# Patient Record
Sex: Female | Born: 1960 | Race: Black or African American | Hispanic: No | Marital: Married | Smoking: Former smoker
Health system: Southern US, Community
[De-identification: ages and names within clinical notes are randomized; demographics above are authoritative.]

## PROBLEM LIST (undated history)

## (undated) DIAGNOSIS — J4520 Mild intermittent asthma, uncomplicated: Secondary | ICD-10-CM

## (undated) DIAGNOSIS — R1319 Other dysphagia: Secondary | ICD-10-CM

## (undated) DIAGNOSIS — G4730 Sleep apnea, unspecified: Secondary | ICD-10-CM

## (undated) DIAGNOSIS — M1712 Unilateral primary osteoarthritis, left knee: Secondary | ICD-10-CM

## (undated) DIAGNOSIS — M1711 Unilateral primary osteoarthritis, right knee: Secondary | ICD-10-CM

## (undated) DIAGNOSIS — K5790 Diverticulosis of intestine, part unspecified, without perforation or abscess without bleeding: Secondary | ICD-10-CM

## (undated) DIAGNOSIS — A0472 Enterocolitis due to Clostridium difficile, not specified as recurrent: Secondary | ICD-10-CM

## (undated) DIAGNOSIS — Z930 Tracheostomy status: Secondary | ICD-10-CM

## (undated) DIAGNOSIS — L509 Urticaria, unspecified: Secondary | ICD-10-CM

## (undated) DIAGNOSIS — E43 Unspecified severe protein-calorie malnutrition: Secondary | ICD-10-CM

## (undated) DIAGNOSIS — J9601 Acute respiratory failure with hypoxia: Secondary | ICD-10-CM

## (undated) DIAGNOSIS — T17908A Unspecified foreign body in respiratory tract, part unspecified causing other injury, initial encounter: Secondary | ICD-10-CM

## (undated) DIAGNOSIS — D126 Benign neoplasm of colon, unspecified: Secondary | ICD-10-CM

## (undated) DIAGNOSIS — R0602 Shortness of breath: Secondary | ICD-10-CM

## (undated) DIAGNOSIS — N390 Urinary tract infection, site not specified: Secondary | ICD-10-CM

## (undated) DIAGNOSIS — D499 Neoplasm of unspecified behavior of unspecified site: Secondary | ICD-10-CM

## (undated) DIAGNOSIS — J302 Other seasonal allergic rhinitis: Secondary | ICD-10-CM

## (undated) DIAGNOSIS — G7000 Myasthenia gravis without (acute) exacerbation: Secondary | ICD-10-CM

## (undated) DIAGNOSIS — I10 Essential (primary) hypertension: Secondary | ICD-10-CM

## (undated) DIAGNOSIS — J189 Pneumonia, unspecified organism: Secondary | ICD-10-CM

## (undated) DIAGNOSIS — J9600 Acute respiratory failure, unspecified whether with hypoxia or hypercapnia: Secondary | ICD-10-CM

## (undated) DIAGNOSIS — D259 Leiomyoma of uterus, unspecified: Secondary | ICD-10-CM

## (undated) DIAGNOSIS — R1310 Dysphagia, unspecified: Secondary | ICD-10-CM

## (undated) DIAGNOSIS — S8990XA Unspecified injury of unspecified lower leg, initial encounter: Secondary | ICD-10-CM

## (undated) DIAGNOSIS — B9620 Unspecified Escherichia coli [E. coli] as the cause of diseases classified elsewhere: Secondary | ICD-10-CM

## (undated) DIAGNOSIS — D128 Benign neoplasm of rectum: Secondary | ICD-10-CM

## (undated) DIAGNOSIS — F418 Other specified anxiety disorders: Secondary | ICD-10-CM

## (undated) DIAGNOSIS — G43909 Migraine, unspecified, not intractable, without status migrainosus: Secondary | ICD-10-CM

## (undated) DIAGNOSIS — Z978 Presence of other specified devices: Secondary | ICD-10-CM

## (undated) DIAGNOSIS — J95851 Ventilator associated pneumonia: Secondary | ICD-10-CM

## (undated) DIAGNOSIS — K219 Gastro-esophageal reflux disease without esophagitis: Secondary | ICD-10-CM

## (undated) DIAGNOSIS — K9422 Gastrostomy infection: Principal | ICD-10-CM

## (undated) DIAGNOSIS — R740 Nonspecific elevation of levels of transaminase and lactic acid dehydrogenase [LDH]: Secondary | ICD-10-CM

## (undated) DIAGNOSIS — K429 Umbilical hernia without obstruction or gangrene: Secondary | ICD-10-CM

## (undated) DIAGNOSIS — G7001 Myasthenia gravis with (acute) exacerbation: Secondary | ICD-10-CM

## (undated) DIAGNOSIS — M25552 Pain in left hip: Secondary | ICD-10-CM

## (undated) DIAGNOSIS — K859 Acute pancreatitis without necrosis or infection, unspecified: Secondary | ICD-10-CM

## (undated) DIAGNOSIS — Z01419 Encounter for gynecological examination (general) (routine) without abnormal findings: Secondary | ICD-10-CM

## (undated) HISTORY — DX: Diverticulosis of intestine, part unspecified, without perforation or abscess without bleeding: K57.90

## (undated) HISTORY — DX: Benign neoplasm of rectum: D12.8

## (undated) HISTORY — DX: Sleep apnea, unspecified: G47.30

## (undated) HISTORY — DX: Acute respiratory failure with hypoxia: J96.01

## (undated) HISTORY — DX: Enterocolitis due to Clostridium difficile, not specified as recurrent: A04.72

## (undated) HISTORY — DX: Shortness of breath: R06.02

## (undated) HISTORY — DX: Unspecified foreign body in respiratory tract, part unspecified causing other injury, initial encounter: T17.908A

## (undated) HISTORY — DX: Pneumonia, unspecified organism: J18.9

## (undated) HISTORY — DX: Urinary tract infection, site not specified: N39.0

## (undated) HISTORY — DX: Urticaria, unspecified: L50.9

## (undated) HISTORY — DX: Acute respiratory failure, unspecified whether with hypoxia or hypercapnia: J96.00

## (undated) HISTORY — DX: Tracheostomy status: Z93.0

## (undated) HISTORY — DX: Benign neoplasm of colon, unspecified: D12.6

## (undated) HISTORY — PX: VAGINAL HYSTERECTOMY: SUR661

## (undated) HISTORY — DX: Myasthenia gravis with (acute) exacerbation: G70.01

## (undated) HISTORY — DX: Acute pancreatitis without necrosis or infection, unspecified: K85.90

## (undated) HISTORY — DX: Ventilator associated pneumonia: J95.851

## (undated) HISTORY — DX: Unspecified severe protein-calorie malnutrition: E43

## (undated) HISTORY — DX: Nonspecific elevation of levels of transaminase and lactic acid dehydrogenase (ldh): R74.0

## (undated) HISTORY — DX: Other dysphagia: R13.19

## (undated) HISTORY — DX: Gastrostomy infection: K94.22

## (undated) HISTORY — DX: Presence of other specified devices: Z97.8

## (undated) HISTORY — DX: Unspecified Escherichia coli (E. coli) as the cause of diseases classified elsewhere: B96.20

## (undated) HISTORY — DX: Encounter for gynecological examination (general) (routine) without abnormal findings: Z01.419

## (undated) HISTORY — DX: Mild intermittent asthma, uncomplicated: J45.20

## (undated) HISTORY — PX: DILATION AND CURETTAGE OF UTERUS: SHX78

## (undated) HISTORY — DX: Pain in left hip: M25.552

---

## 1987-03-04 HISTORY — PX: TUBAL LIGATION: SHX77

## 1997-09-19 ENCOUNTER — Emergency Department (HOSPITAL_COMMUNITY): Admission: EM | Admit: 1997-09-19 | Discharge: 1997-09-20 | Payer: Self-pay | Admitting: Emergency Medicine

## 1997-09-20 ENCOUNTER — Encounter: Admission: RE | Admit: 1997-09-20 | Discharge: 1997-09-20 | Payer: Self-pay | Admitting: *Deleted

## 1997-09-20 ENCOUNTER — Encounter: Admission: RE | Admit: 1997-09-20 | Discharge: 1997-12-19 | Payer: Self-pay | Admitting: Internal Medicine

## 1997-09-27 ENCOUNTER — Encounter: Admission: RE | Admit: 1997-09-27 | Discharge: 1997-09-27 | Payer: Self-pay | Admitting: Internal Medicine

## 1998-01-23 ENCOUNTER — Encounter: Admission: RE | Admit: 1998-01-23 | Discharge: 1998-04-23 | Payer: Self-pay | Admitting: *Deleted

## 1998-02-05 ENCOUNTER — Encounter: Admission: RE | Admit: 1998-02-05 | Discharge: 1998-02-27 | Payer: Self-pay | Admitting: *Deleted

## 1998-03-08 ENCOUNTER — Other Ambulatory Visit: Admission: RE | Admit: 1998-03-08 | Discharge: 1998-03-08 | Payer: Self-pay | Admitting: Gynecology

## 1999-01-07 ENCOUNTER — Encounter: Admission: RE | Admit: 1999-01-07 | Discharge: 1999-04-07 | Payer: Self-pay | Admitting: Family Medicine

## 1999-05-13 ENCOUNTER — Other Ambulatory Visit: Admission: RE | Admit: 1999-05-13 | Discharge: 1999-05-13 | Payer: Self-pay | Admitting: Gynecology

## 1999-07-12 ENCOUNTER — Encounter (INDEPENDENT_AMBULATORY_CARE_PROVIDER_SITE_OTHER): Payer: Self-pay | Admitting: Specialist

## 1999-07-12 ENCOUNTER — Ambulatory Visit (HOSPITAL_COMMUNITY): Admission: RE | Admit: 1999-07-12 | Discharge: 1999-07-12 | Payer: Self-pay | Admitting: Gynecology

## 1999-11-01 ENCOUNTER — Encounter: Payer: Self-pay | Admitting: Family Medicine

## 1999-11-01 ENCOUNTER — Encounter: Admission: RE | Admit: 1999-11-01 | Discharge: 1999-11-01 | Payer: Self-pay | Admitting: Family Medicine

## 2000-05-18 ENCOUNTER — Other Ambulatory Visit: Admission: RE | Admit: 2000-05-18 | Discharge: 2000-05-18 | Payer: Self-pay | Admitting: Gynecology

## 2001-06-14 ENCOUNTER — Other Ambulatory Visit: Admission: RE | Admit: 2001-06-14 | Discharge: 2001-06-14 | Payer: Self-pay | Admitting: Gynecology

## 2001-06-15 ENCOUNTER — Encounter: Admission: RE | Admit: 2001-06-15 | Discharge: 2001-06-15 | Payer: Self-pay | Admitting: Gynecology

## 2001-06-15 ENCOUNTER — Encounter: Payer: Self-pay | Admitting: Gynecology

## 2005-01-16 ENCOUNTER — Encounter: Admission: RE | Admit: 2005-01-16 | Discharge: 2005-01-16 | Payer: Self-pay | Admitting: Family Medicine

## 2006-04-12 ENCOUNTER — Encounter: Admission: RE | Admit: 2006-04-12 | Discharge: 2006-04-12 | Payer: Self-pay | Admitting: Family Medicine

## 2008-11-21 ENCOUNTER — Emergency Department (HOSPITAL_COMMUNITY): Admission: EM | Admit: 2008-11-21 | Discharge: 2008-11-22 | Payer: Self-pay | Admitting: Emergency Medicine

## 2008-11-21 ENCOUNTER — Emergency Department (HOSPITAL_COMMUNITY): Admission: EM | Admit: 2008-11-21 | Discharge: 2008-11-21 | Payer: Self-pay | Admitting: Family Medicine

## 2009-03-07 ENCOUNTER — Emergency Department (HOSPITAL_COMMUNITY): Admission: EM | Admit: 2009-03-07 | Discharge: 2009-03-07 | Payer: Self-pay | Admitting: Family Medicine

## 2009-05-08 ENCOUNTER — Ambulatory Visit: Payer: Self-pay | Admitting: Internal Medicine

## 2009-05-30 ENCOUNTER — Ambulatory Visit: Payer: Self-pay | Admitting: Family Medicine

## 2009-06-08 ENCOUNTER — Ambulatory Visit (HOSPITAL_COMMUNITY): Admission: RE | Admit: 2009-06-08 | Discharge: 2009-06-08 | Payer: Self-pay | Admitting: Family Medicine

## 2009-06-22 ENCOUNTER — Ambulatory Visit (HOSPITAL_COMMUNITY): Admission: RE | Admit: 2009-06-22 | Discharge: 2009-06-22 | Payer: Self-pay | Admitting: Internal Medicine

## 2009-06-27 ENCOUNTER — Ambulatory Visit: Payer: Self-pay | Admitting: Internal Medicine

## 2009-06-27 ENCOUNTER — Encounter (INDEPENDENT_AMBULATORY_CARE_PROVIDER_SITE_OTHER): Payer: Self-pay | Admitting: Family Medicine

## 2009-06-27 LAB — CONVERTED CEMR LAB
ALT: 10 units/L (ref 0–35)
AST: 12 units/L (ref 0–37)
Albumin: 4.1 g/dL (ref 3.5–5.2)
Alkaline Phosphatase: 70 units/L (ref 39–117)
BUN: 11 mg/dL (ref 6–23)
Basophils Absolute: 0 10*3/uL (ref 0.0–0.1)
Basophils Relative: 0 % (ref 0–1)
CO2: 23 meq/L (ref 19–32)
Calcium: 9.2 mg/dL (ref 8.4–10.5)
Chloride: 106 meq/L (ref 96–112)
Cholesterol: 162 mg/dL (ref 0–200)
Creatinine, Ser: 0.69 mg/dL (ref 0.40–1.20)
Eosinophils Absolute: 0 10*3/uL (ref 0.0–0.7)
Eosinophils Relative: 0 % (ref 0–5)
Glucose, Bld: 84 mg/dL (ref 70–99)
HCT: 41 % (ref 36.0–46.0)
HDL: 43 mg/dL (ref >39)
Hemoglobin: 13 g/dL (ref 12.0–15.0)
LDL Cholesterol: 99 mg/dL (ref 0–99)
Lymphocytes Relative: 29 % (ref 12–46)
Lymphs Abs: 2.3 10*3/uL (ref 0.7–4.0)
MCHC: 31.7 g/dL (ref 30.0–36.0)
MCV: 79 fL (ref 78.0–100.0)
Monocytes Absolute: 0.4 10*3/uL (ref 0.1–1.0)
Monocytes Relative: 5 % (ref 3–12)
Neutro Abs: 5.2 10*3/uL (ref 1.7–7.7)
Neutrophils Relative %: 66 % (ref 43–77)
Platelets: 156 10*3/uL (ref 150–400)
Potassium: 4.4 meq/L (ref 3.5–5.3)
RBC: 5.19 M/uL — ABNORMAL HIGH (ref 3.87–5.11)
RDW: 17 % — ABNORMAL HIGH (ref 11.5–15.5)
Sodium: 138 meq/L (ref 135–145)
TSH: 1.732 microintl units/mL (ref 0.350–4.500)
Total Bilirubin: 0.4 mg/dL (ref 0.3–1.2)
Total CHOL/HDL Ratio: 3.8
Total Protein: 6.8 g/dL (ref 6.0–8.3)
Triglycerides: 101 mg/dL (ref <150)
VLDL: 20 mg/dL (ref 0–40)
Vit D, 25-Hydroxy: 13 ng/mL — ABNORMAL LOW (ref 30–89)
WBC: 7.8 10*3/uL (ref 4.0–10.5)

## 2009-07-03 ENCOUNTER — Ambulatory Visit: Payer: Self-pay | Admitting: Internal Medicine

## 2009-07-19 ENCOUNTER — Encounter: Admission: RE | Admit: 2009-07-19 | Discharge: 2009-08-21 | Payer: Self-pay | Admitting: Family Medicine

## 2009-08-14 ENCOUNTER — Ambulatory Visit: Payer: Self-pay | Admitting: Internal Medicine

## 2009-08-21 ENCOUNTER — Ambulatory Visit (HOSPITAL_COMMUNITY): Admission: RE | Admit: 2009-08-21 | Discharge: 2009-08-21 | Payer: Self-pay | Admitting: Family Medicine

## 2009-09-11 ENCOUNTER — Ambulatory Visit: Payer: Self-pay | Admitting: Internal Medicine

## 2009-09-11 ENCOUNTER — Encounter (INDEPENDENT_AMBULATORY_CARE_PROVIDER_SITE_OTHER): Payer: Self-pay | Admitting: Family Medicine

## 2009-09-11 LAB — CONVERTED CEMR LAB: Helicobacter Pylori Antibody-IgG: 7.3 — ABNORMAL HIGH

## 2009-09-27 ENCOUNTER — Ambulatory Visit: Payer: Self-pay | Admitting: Obstetrics and Gynecology

## 2009-09-28 ENCOUNTER — Encounter (INDEPENDENT_AMBULATORY_CARE_PROVIDER_SITE_OTHER): Payer: Self-pay | Admitting: *Deleted

## 2009-09-28 LAB — CONVERTED CEMR LAB
Trich, Wet Prep: NONE SEEN
WBC, Wet Prep HPF POC: NONE SEEN

## 2009-11-08 ENCOUNTER — Emergency Department (HOSPITAL_COMMUNITY): Admission: EM | Admit: 2009-11-08 | Discharge: 2009-11-08 | Payer: Self-pay | Admitting: Family Medicine

## 2009-11-14 ENCOUNTER — Ambulatory Visit: Payer: Self-pay | Admitting: Internal Medicine

## 2009-11-15 ENCOUNTER — Encounter (INDEPENDENT_AMBULATORY_CARE_PROVIDER_SITE_OTHER): Payer: Self-pay | Admitting: Family Medicine

## 2009-11-19 ENCOUNTER — Ambulatory Visit (HOSPITAL_COMMUNITY): Admission: RE | Admit: 2009-11-19 | Discharge: 2009-11-19 | Payer: Self-pay | Admitting: Family Medicine

## 2010-01-02 ENCOUNTER — Ambulatory Visit: Payer: Self-pay | Admitting: Obstetrics and Gynecology

## 2010-01-25 ENCOUNTER — Emergency Department (HOSPITAL_COMMUNITY)
Admission: EM | Admit: 2010-01-25 | Discharge: 2010-01-25 | Payer: Self-pay | Source: Home / Self Care | Admitting: Emergency Medicine

## 2010-03-22 ENCOUNTER — Other Ambulatory Visit (HOSPITAL_COMMUNITY): Payer: Self-pay | Admitting: *Deleted

## 2010-03-22 DIAGNOSIS — Z1231 Encounter for screening mammogram for malignant neoplasm of breast: Secondary | ICD-10-CM

## 2010-03-22 DIAGNOSIS — Z0000 Encounter for general adult medical examination without abnormal findings: Secondary | ICD-10-CM

## 2010-04-05 ENCOUNTER — Ambulatory Visit: Admit: 2010-04-05 | Payer: Self-pay | Admitting: Obstetrics & Gynecology

## 2010-04-05 ENCOUNTER — Ambulatory Visit: Payer: Self-pay | Admitting: Obstetrics & Gynecology

## 2010-05-18 LAB — POCT PREGNANCY, URINE: Preg Test, Ur: NEGATIVE

## 2010-06-07 LAB — URINE CULTURE: Colony Count: >=100000

## 2010-06-07 LAB — COMPREHENSIVE METABOLIC PANEL
ALT: 11 U/L (ref 0–35)
AST: 15 U/L (ref 0–37)
Albumin: 3.2 g/dL — ABNORMAL LOW (ref 3.5–5.2)
Alkaline Phosphatase: 65 U/L (ref 39–117)
BUN: 9 mg/dL (ref 6–23)
CO2: 23 mEq/L (ref 19–32)
Calcium: 8.4 mg/dL (ref 8.4–10.5)
Chloride: 107 mEq/L (ref 96–112)
Creatinine, Ser: 0.63 mg/dL (ref 0.4–1.2)
GFR calc Af Amer: >60 mL/min (ref >60)
GFR calc non Af Amer: >60 mL/min (ref >60)
Glucose, Bld: 109 mg/dL — ABNORMAL HIGH (ref 70–99)
Potassium: 3.3 mEq/L — ABNORMAL LOW (ref 3.5–5.1)
Sodium: 136 mEq/L (ref 135–145)
Total Bilirubin: 0.6 mg/dL (ref 0.3–1.2)
Total Protein: 6.3 g/dL (ref 6.0–8.3)

## 2010-06-07 LAB — POCT URINALYSIS DIP (DEVICE)
Glucose, UA: NEGATIVE mg/dL
Ketones, ur: NEGATIVE mg/dL
Nitrite: POSITIVE — AB
Protein, ur: 100 mg/dL — AB
Specific Gravity, Urine: 1.025 (ref 1.005–1.030)
Urobilinogen, UA: 2 mg/dL — ABNORMAL HIGH (ref 0.0–1.0)
pH: 7 (ref 5.0–8.0)

## 2010-06-07 LAB — URINE MICROSCOPIC-ADD ON

## 2010-06-07 LAB — CBC
HCT: 34.8 % — ABNORMAL LOW (ref 36.0–46.0)
Hemoglobin: 11.7 g/dL — ABNORMAL LOW (ref 12.0–15.0)
MCHC: 33.7 g/dL (ref 30.0–36.0)
MCV: 79.3 fL (ref 78.0–100.0)
Platelets: 166 10*3/uL (ref 150–400)
RBC: 4.39 MIL/uL (ref 3.87–5.11)
RDW: 16.1 % — ABNORMAL HIGH (ref 11.5–15.5)
WBC: 12.5 10*3/uL — ABNORMAL HIGH (ref 4.0–10.5)

## 2010-06-07 LAB — RPR: RPR Ser Ql: REACTIVE — AB

## 2010-06-07 LAB — URINALYSIS, ROUTINE W REFLEX MICROSCOPIC
Bilirubin Urine: NEGATIVE
Glucose, UA: NEGATIVE mg/dL
Ketones, ur: 15 mg/dL — AB
Nitrite: POSITIVE — AB
Protein, ur: NEGATIVE mg/dL
Specific Gravity, Urine: 1.035 — ABNORMAL HIGH (ref 1.005–1.030)
Urobilinogen, UA: 1 mg/dL (ref 0.0–1.0)
pH: 6 (ref 5.0–8.0)

## 2010-06-07 LAB — WET PREP, GENITAL
Trich, Wet Prep: NONE SEEN
Yeast Wet Prep HPF POC: NONE SEEN

## 2010-06-07 LAB — T.PALLIDUM AB, IGG: T pallidum Antibodies (TP-PA): 0.2 IV (ref <1.0)

## 2010-06-07 LAB — RPR TITER: RPR Titer: 1:1 {titer}

## 2010-06-07 LAB — GC/CHLAMYDIA PROBE AMP, GENITAL
Chlamydia, DNA Probe: NEGATIVE
GC Probe Amp, Genital: NEGATIVE

## 2010-06-07 LAB — POCT PREGNANCY, URINE: Preg Test, Ur: NEGATIVE

## 2010-06-07 LAB — LIPASE, BLOOD: Lipase: 15 U/L (ref 11–59)

## 2010-06-24 ENCOUNTER — Ambulatory Visit (HOSPITAL_COMMUNITY)
Admission: RE | Admit: 2010-06-24 | Payer: Self-pay | Source: Ambulatory Visit | Attending: *Deleted | Admitting: *Deleted

## 2010-07-01 ENCOUNTER — Other Ambulatory Visit (HOSPITAL_COMMUNITY): Payer: Self-pay | Admitting: Family Medicine

## 2010-07-01 DIAGNOSIS — Z1231 Encounter for screening mammogram for malignant neoplasm of breast: Secondary | ICD-10-CM

## 2010-07-03 ENCOUNTER — Ambulatory Visit (HOSPITAL_COMMUNITY)
Admission: RE | Admit: 2010-07-03 | Discharge: 2010-07-03 | Disposition: A | Discharge status: Home / Self Care | Payer: Self-pay | Source: Ambulatory Visit | Attending: Family Medicine | Admitting: Family Medicine

## 2010-07-03 DIAGNOSIS — Z1231 Encounter for screening mammogram for malignant neoplasm of breast: Secondary | ICD-10-CM | POA: Insufficient documentation

## 2011-01-14 ENCOUNTER — Other Ambulatory Visit: Payer: Self-pay

## 2011-01-14 ENCOUNTER — Encounter: Payer: Self-pay | Admitting: *Deleted

## 2011-01-14 ENCOUNTER — Emergency Department (HOSPITAL_COMMUNITY)
Admission: EM | Admit: 2011-01-14 | Discharge: 2011-01-14 | Disposition: A | Discharge status: Home / Self Care | Payer: Self-pay | Attending: Emergency Medicine | Admitting: Emergency Medicine

## 2011-01-14 DIAGNOSIS — R5381 Other malaise: Secondary | ICD-10-CM | POA: Insufficient documentation

## 2011-01-14 DIAGNOSIS — I10 Essential (primary) hypertension: Secondary | ICD-10-CM | POA: Insufficient documentation

## 2011-01-14 DIAGNOSIS — M25569 Pain in unspecified knee: Secondary | ICD-10-CM | POA: Insufficient documentation

## 2011-01-14 DIAGNOSIS — R42 Dizziness and giddiness: Secondary | ICD-10-CM | POA: Insufficient documentation

## 2011-01-14 DIAGNOSIS — R110 Nausea: Secondary | ICD-10-CM | POA: Insufficient documentation

## 2011-01-14 DIAGNOSIS — K219 Gastro-esophageal reflux disease without esophagitis: Secondary | ICD-10-CM | POA: Insufficient documentation

## 2011-01-14 DIAGNOSIS — R51 Headache: Secondary | ICD-10-CM | POA: Insufficient documentation

## 2011-01-14 HISTORY — DX: Essential (primary) hypertension: I10

## 2011-01-14 HISTORY — DX: Neoplasm of unspecified behavior of unspecified site: D49.9

## 2011-01-14 HISTORY — DX: Gastro-esophageal reflux disease without esophagitis: K21.9

## 2011-01-14 HISTORY — DX: Unspecified injury of unspecified lower leg, initial encounter: S89.90XA

## 2011-01-14 LAB — POCT I-STAT, CHEM 8
BUN: 14 mg/dL (ref 6–23)
Calcium, Ion: 1.14 mmol/L (ref 1.12–1.32)
Chloride: 103 mEq/L (ref 96–112)
Creatinine, Ser: 0.6 mg/dL (ref 0.50–1.10)
Glucose, Bld: 93 mg/dL (ref 70–99)
HCT: 42 % (ref 36.0–46.0)
Hemoglobin: 14.3 g/dL (ref 12.0–15.0)
Potassium: 3.7 mEq/L (ref 3.5–5.1)
Sodium: 140 mEq/L (ref 135–145)
TCO2: 28 mmol/L (ref 0–100)

## 2011-01-14 LAB — CBC
HCT: 39.4 % (ref 36.0–46.0)
Hemoglobin: 13.1 g/dL (ref 12.0–15.0)
MCH: 26.1 pg (ref 26.0–34.0)
MCHC: 33.2 g/dL (ref 30.0–36.0)
MCV: 78.5 fL (ref 78.0–100.0)
Platelets: 130 10*3/uL — ABNORMAL LOW (ref 150–400)
RBC: 5.02 MIL/uL (ref 3.87–5.11)
RDW: 15.6 % — ABNORMAL HIGH (ref 11.5–15.5)
WBC: 8.5 10*3/uL (ref 4.0–10.5)

## 2011-01-14 MED ORDER — MECLIZINE HCL 50 MG PO TABS: 50.0000 mg | ORAL_TABLET | Freq: Three times a day (TID) | ORAL | Status: AC | PRN

## 2011-01-14 MED ORDER — MECLIZINE HCL 25 MG PO TABS
25.0000 mg | ORAL_TABLET | Freq: Once | ORAL | Status: AC
  Administered 2011-01-14: 25 mg via ORAL
  Filled 2011-01-14: qty 1

## 2011-01-14 MED ORDER — ONDANSETRON HCL 4 MG/2ML IJ SOLN
INTRAMUSCULAR | Status: AC
  Administered 2011-01-14: 4 mg
  Filled 2011-01-14: qty 2

## 2011-01-14 MED ORDER — IBUPROFEN 800 MG PO TABS
800.0000 mg | ORAL_TABLET | Freq: Once | ORAL | Status: AC
  Administered 2011-01-14: 800 mg via ORAL
  Filled 2011-01-14: qty 1

## 2011-01-14 MED ORDER — LORAZEPAM 2 MG/ML IJ SOLN
1.0000 mg | Freq: Once | INTRAMUSCULAR | Status: AC
  Administered 2011-01-14: 1 mg via INTRAVENOUS
  Filled 2011-01-14: qty 1

## 2011-01-14 NOTE — ED Provider Notes (Signed)
Medical screening examination/treatment/procedure(s) were performed by non-physician practitioner and as supervising physician I was immediately available for consultation/collaboration.   Nelia Shi, MD 01/14/11 [id number]

## 2011-01-14 NOTE — ED Notes (Signed)
Patient states that since Sunday she has been having increasing dizziness and double vision with a right sided headache. She is alert and oriented. Stroke scale is negative. Pt states that due to the dizziness that she has been nauseated at times. She recently was told that she no longer qualified for [facility] and is on a lot of anti-anxiety medications. She states that she has been trying to "make her medications last" by attempting to alternate days. Breath sounds are clear and bowel sounds are present. Iv started and protocols initiated. When nursing when to discharge patient she stated that she was also concerned about her right knee but denies any fall or trauma. Pt given multiple referrals for follow up care since she has been having some issues in establishing a pcp since she was dropped [facility].

## 2011-01-14 NOTE — ED Provider Notes (Signed)
History     CSN: [id number] Arrival date & time: 01/14/2011  9:00 AM   First MD Initiated Contact with Patient 01/14/11 [phone]      Chief Complaint  Patient presents with  . Dizziness  . Weakness  . Knee Pain  . Nausea    (Consider location/radiation/quality/duration/timing/severity/associated sxs/prior treatment) HPI  Patient states she has history of hypertension, anxiety, and hiatal hernia that she's been given prescription medications in the past for but that she "stretches out as long as I can because I do not have a primary care doctor to refill" presents to emergency department complaining of a two-week history of gradual onset dizziness and lightheadedness stating that every morning she wakes to go to work and throughout the day while she's working she feels lightheaded and at times feels like the room is spinning. Patient states that for the first time today with the dizziness she became nauseated. Patient also complaining of gradual onset of right-sided headache that started today. Patient denies fevers, chills, visual changes, stiff neck, difficulty ambulating, loss of coordination, headache or nausea prior to today. Patient states she has not been taking her blood pressure medicine because she's trying to "save them for when she needs them." Patient denies aggravating or alleviating factors states despite movement throughout the day has intermittent symptoms.  Past Medical History  Diagnosis Date  . GERD (gastroesophageal reflux disease)   . 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 Hypertension   . Knee injury     Past Surgical History  Procedure Date  . Cesarean section     History reviewed. No pertinent family history.  History  Substance Use Topics  . Smoking status: Current Everyday Smoker -- 0.5 packs/day  . Smokeless tobacco: Not on file  . Alcohol Use: No    OB History    Grav Para Term Preterm Abortions TAB SAB Ect Mult Living                  Review of  Systems  All other systems reviewed and are negative.    Allergies  Dicyclomine  Home Medications   Current Outpatient Rx  Name Route Sig Dispense Refill  . CLONAZEPAM 1 MG PO TABS Oral Take 0.5-1 mg by mouth 2 (two) times daily as needed. For anxiety     . HYDROCHLOROTHIAZIDE 25 MG PO TABS Oral Take 25 mg by mouth daily.      [name] PANTOPRAZOLE SODIUM 40 MG PO TBEC Oral Take 40 mg by mouth daily.      . SERTRALINE HCL 100 MG PO TABS Oral Take 100 mg by mouth daily.      [name] VITAMIN D (ERGOCALCIFEROL) 50000 UNITS PO CAPS Oral Take 50,000 Units by mouth every 7 (seven) days. wednesdays       BP 130/62  Pulse 86  Temp(Src) 98 F (36.7 C) (Oral)  Resp 16  SpO2 97%  Physical Exam  Nursing note and vitals reviewed. Constitutional: She is oriented to person, place, and time. She appears well-developed and well-nourished. No distress.  HENT:  Head: Normocephalic and atraumatic.  Right Ear: External ear normal.  Left Ear: External ear normal.  Eyes: Conjunctivae and EOM are normal. Pupils are equal, round, and reactive to light. Right eye exhibits no nystagmus. Left eye exhibits no nystagmus.  Neck: Normal range of motion. Neck supple. Carotid bruit is not present. No Brudzinski's sign and no Kernig's sign noted.  Cardiovascular: Normal rate, regular rhythm, normal heart sounds  and intact distal pulses.  Exam reveals no gallop and no friction rub.   No murmur heard. Pulmonary/Chest: Effort normal and breath sounds normal. No respiratory distress. She has no wheezes. She has no rales. She exhibits no tenderness.  Abdominal: Bowel sounds are normal. She exhibits no distension and no mass. There is no tenderness. There is no rebound and no guarding.  Musculoskeletal: Normal range of motion. She exhibits no edema and no tenderness.  Neurological: She is alert and oriented to person, place, and time. No cranial nerve deficit. Coordination normal.  Skin: Skin is warm and dry. No rash noted. She  is not diaphoretic. No erythema.  Psychiatric: She has a normal mood and affect. Her behavior is normal.    ED Course  Procedures (including critical care time)  PO meclizine and ibuprofen  11:38 AM After IV ativan, patient is ambulating about department to go to bathroom without difficulty. No ataxia   Date: 01/14/2011  Rate: 68  Rhythm: normal sinus rhythm  QRS Axis: normal  Intervals: normal  ST/T Wave abnormalities: normal  Conduction Disutrbances:none  Narrative Interpretation:   Old EKG Reviewed: none available    Labs Reviewed  CBC - Abnormal; Notable for the following:    RDW 15.6 (*)    Platelets 130 (*)    All other components within normal limits  POCT I-STAT, CHEM 8  I-STAT, CHEM 8   No results found.   1. Dizziness   2. Hypertension       MDM  Patient is alert and oriented, no neuro focal findings, ambulating without ataxia and symptoms of dizziness improved after by mouth meclizine and IV Ativan. No nystagmus. Patient states headache has resolved. Afebrile no meningeal signs. Patient is agreeable to establishing care once again with her primary care doctor to discuss her blood pressure management of her blood pressure and ER of 130/62 despite not taking her blood pressure medicine is reasonable. Patient has many hydrochlorothiazide pills left.        Jenness Corner, PA 01/14/11 [address] [place], [place] 01/14/11 1142

## 2011-01-14 NOTE — ED Notes (Signed)
Pt states that she had had dizziness and weakness after taking medication for her knee pain.  Pt has been taking OTC meds for same.

## 2013-05-27 ENCOUNTER — Encounter (HOSPITAL_COMMUNITY): Payer: Self-pay | Admitting: Emergency Medicine

## 2013-05-27 ENCOUNTER — Emergency Department (HOSPITAL_COMMUNITY)
Admission: EM | Admit: 2013-05-27 | Discharge: 2013-05-27 | Disposition: A | Discharge status: Home / Self Care | Payer: BC Managed Care – PPO | Source: Home / Self Care

## 2013-05-27 DIAGNOSIS — R1011 Right upper quadrant pain: Secondary | ICD-10-CM

## 2013-05-27 DIAGNOSIS — R197 Diarrhea, unspecified: Secondary | ICD-10-CM

## 2013-05-27 DIAGNOSIS — R110 Nausea: Secondary | ICD-10-CM

## 2013-05-27 MED ORDER — ONDANSETRON HCL 4 MG PO TABS: 4.0000 mg | ORAL_TABLET | Freq: Four times a day (QID) | ORAL | Status: DC

## 2013-05-27 NOTE — Discharge Instructions (Signed)
Abdominal Pain, Women Avoid fatty, greasy, fast foods, or dairy for now. Mostly clear liquids. Abdominal (stomach, pelvic, or belly) pain can be caused by many things. It is important to tell your doctor:  The location of the pain.  Does it come and go or is it present all the time?  Are there things that start the pain (eating certain foods, exercise)?  Are there other symptoms associated with the pain (fever, nausea, vomiting, diarrhea)? All of this is helpful to know when trying to find the cause of the pain. CAUSES   Stomach: virus or bacteria infection, or ulcer.  Intestine: appendicitis (inflamed appendix), regional ileitis (Crohn's disease), ulcerative colitis (inflamed colon), irritable bowel syndrome, diverticulitis (inflamed diverticulum of the colon), or cancer of the stomach or intestine.  Gallbladder disease or stones in the gallbladder.  Kidney disease, kidney stones, or infection.  Pancreas infection or cancer.  Fibromyalgia (pain disorder).  Diseases of the female organs:  Uterus: fibroid (non-cancerous) tumors or infection.  Fallopian tubes: infection or tubal pregnancy.  Ovary: cysts or tumors.  Pelvic adhesions (scar tissue).  Endometriosis (uterus lining tissue growing in the pelvis and on the pelvic organs).  Pelvic congestion syndrome (female organs filling up with blood just before the menstrual period).  Pain with the menstrual period.  Pain with ovulation (producing an egg).  Pain with an IUD (intrauterine device, birth control) in the uterus.  Cancer of the female organs.  Functional pain (pain not caused by a disease, may improve without treatment).  Psychological pain.  Depression. DIAGNOSIS  Your doctor will decide the seriousness of your pain by doing an examination.  Blood tests.  X-rays.  Ultrasound.  CT scan (computed tomography, special type of X-ray).  MRI (magnetic resonance imaging).  Cultures, for  infection.  Barium enema (dye inserted in the large intestine, to better view it with X-rays).  Colonoscopy (looking in intestine with a lighted tube).  Laparoscopy (minor surgery, looking in abdomen with a lighted tube).  Major abdominal exploratory surgery (looking in abdomen with a large incision). TREATMENT  The treatment will depend on the cause of the pain.   Many cases can be observed and treated at home.  Over-the-counter medicines recommended by your caregiver.  Prescription medicine.  Antibiotics, for infection.  Birth control pills, for painful periods or for ovulation pain.  Hormone treatment, for endometriosis.  Nerve blocking injections.  Physical therapy.  Antidepressants.  Counseling with a psychologist or psychiatrist.  Minor or major surgery. HOME CARE INSTRUCTIONS   Do not take laxatives, unless directed by your caregiver.  Take over-the-counter pain medicine only if ordered by your caregiver. Do not take aspirin because it can cause an upset stomach or bleeding.  Try a clear liquid diet (broth or water) as ordered by your caregiver. Slowly move to a bland diet, as tolerated, if the pain is related to the stomach or intestine.  Have a thermometer and take your temperature several times a day, and record it.  Bed rest and sleep, if it helps the pain.  Avoid sexual intercourse, if it causes pain.  Avoid stressful situations.  Keep your follow-up appointments and tests, as your caregiver orders.  If the pain does not go away with medicine or surgery, you may try:  Acupuncture.  Relaxation exercises (yoga, meditation).  Group therapy.  Counseling. SEEK MEDICAL CARE IF:   You notice certain foods cause stomach pain.  Your home care treatment is not helping your pain.  You need stronger pain  medicine.  You want your IUD removed.  You feel faint or lightheaded.  You develop nausea and vomiting.  You develop a rash.  You are  having side effects or an allergy to your medicine. SEEK IMMEDIATE MEDICAL CARE IF:   Your pain does not go away or gets worse.  You have a fever.  Your pain is felt only in portions of the abdomen. The right side could possibly be appendicitis. The left lower portion of the abdomen could be colitis or diverticulitis.  You are passing blood in your stools (bright red or black tarry stools, with or without vomiting).  You have blood in your urine.  You develop chills, with or without a fever.  You pass out. MAKE SURE YOU:   Understand these instructions.  Will watch your condition.  Will get help right away if you are not doing well or get worse. Document Released: 12/15/2006 Document Revised: 05/12/2011 Document Reviewed: 01/04/2009 [facility] Patient Information 2014 [place], [place].  Biliary Colic  Biliary colic is a steady or irregular pain in the upper abdomen. It is usually under the right side of the rib cage. It happens when gallstones interfere with the normal flow of bile from the gallbladder. Bile is a liquid that helps to digest fats. Bile is made in the liver and stored in the gallbladder. When you eat a meal, bile passes from the gallbladder through the cystic duct and the common bile duct into the small intestine. There, it mixes with partially digested food. If a gallstone blocks either of these ducts, the normal flow of bile is blocked. The muscle cells in the bile duct contract forcefully to try to move the stone. This causes the pain of biliary colic.  SYMPTOMS   A person with biliary colic usually complains of pain in the upper abdomen. This pain can be:  In the center of the upper abdomen just below the breastbone.  In the upper-right part of the abdomen, near the gallbladder and liver.  Spread back toward the right shoulder blade.  Nausea and vomiting.  The pain usually occurs after eating.  Biliary colic is usually triggered by the digestive system's  demand for bile. The demand for bile is high after fatty meals. Symptoms can also occur when a person who has been fasting suddenly eats a very large meal. Most episodes of biliary colic pass after 1 to 5 hours. After the most intense pain passes, your abdomen may continue to ache mildly for about 24 hours. DIAGNOSIS  After you describe your symptoms, your caregiver will perform a physical exam. He or she will pay attention to the upper right portion of your belly (abdomen). This is the area of your liver and gallbladder. An ultrasound will help your caregiver look for gallstones. Specialized scans of the gallbladder may also be done. Blood tests may be done, especially if you have fever or if your pain persists. PREVENTION  Biliary colic can be prevented by controlling the risk factors for gallstones. Some of these risk factors, such as heredity, increasing age, and pregnancy are a normal part of life. Obesity and a high-fat diet are risk factors you can change through a healthy lifestyle. Women going through menopause who take hormone replacement therapy (estrogen) are also more likely to develop biliary colic. TREATMENT   Pain medication may be prescribed.  You may be encouraged to eat a fat-free diet.  If the first episode of biliary colic is severe, or episodes of colic keep retuning,  surgery to remove the gallbladder (cholecystectomy) is usually recommended. This procedure can be done through small incisions using an instrument called a laparoscope. The procedure often requires a brief stay in the hospital. Some people can leave the hospital the same day. It is the most widely used treatment in people troubled by painful gallstones. It is effective and safe, with no complications in more than 90% of cases.  If surgery cannot be done, medication that dissolves gallstones may be used. This medication is expensive and can take months or years to work. Only small stones will dissolve.  Rarely,  medication to dissolve gallstones is combined with a procedure called shock-wave lithotripsy. This procedure uses carefully aimed shock waves to break up gallstones. In many people treated with this procedure, gallstones form again within a few years. PROGNOSIS  If gallstones block your cystic duct or common bile duct, you are at risk for repeated episodes of biliary colic. There is also a 25% chance that you will develop a gallbladder infection(acute cholecystitis), or some other complication of gallstones within 10 to 20 years. If you have surgery, schedule it at a time that is convenient for you and at a time when you are not sick. HOME CARE INSTRUCTIONS   Drink plenty of clear fluids.  Avoid fatty, greasy or fried foods, or any foods that make your pain worse.  Take medications as directed. SEEK MEDICAL CARE IF:   You develop a fever over 100.5 F (38.1 C).  Your pain gets worse over time.  You develop nausea that prevents you from eating and drinking.  You develop vomiting. SEEK IMMEDIATE MEDICAL CARE IF:   You have continuous or severe belly (abdominal) pain which is not relieved with medications.  You develop nausea and vomiting which is not relieved with medications.  You have symptoms of biliary colic and you suddenly develop a fever and shaking chills. This may signal cholecystitis. Call your caregiver immediately.  You develop a yellow color to your skin or the white part of your eyes (jaundice). Document Released: 07/21/2005 Document Revised: 05/12/2011 Document Reviewed: 09/30/2007 [facility] Patient Information 2014 [place].  Diet for Diarrhea, Adult Frequent, runny stools (diarrhea) may be caused or worsened by food or drink. Diarrhea may be relieved by changing your diet. Since diarrhea can last up to 7 days, it is easy for you to lose too much fluid from the body and become dehydrated. Fluids that are lost need to be replaced. Along with a modified diet, make  sure you drink enough fluids to keep your urine clear or pale yellow. DIET INSTRUCTIONS  Ensure adequate fluid intake (hydration): have 1 cup (8 oz) of fluid for each diarrhea episode. Avoid fluids that contain simple sugars or sports drinks, fruit juices, whole milk products, and sodas. Your urine should be clear or pale yellow if you are drinking enough fluids. Hydrate with an oral rehydration solution that you can purchase at pharmacies, retail stores, and online. You can prepare an oral rehydration solution at home by mixing the following ingredients together:    tsp table salt.   tsp baking soda.   tsp salt substitute containing potassium chloride.  1  tablespoons sugar.  1 L (34 oz) of water.  Certain foods and beverages may increase the speed at which food moves through the gastrointestinal (GI) tract. These foods and beverages should be avoided and include:  Caffeinated and alcoholic beverages.  High-fiber foods, such as raw fruits and vegetables, nuts, seeds, and whole grain  breads and cereals.  Foods and beverages sweetened with sugar alcohols, such as xylitol, sorbitol, and mannitol.  Some foods may be well tolerated and may help thicken stool including:  Starchy foods, such as rice, toast, pasta, low-sugar cereal, oatmeal, grits, baked potatoes, crackers, and bagels.   Bananas.   Applesauce.  Add probiotic-rich foods to help increase healthy bacteria in the GI tract, such as yogurt and fermented milk products. RECOMMENDED FOODS AND BEVERAGES Starches Choose foods with less than 2 g of fiber per serving.  Recommended:  White, [place], and pita breads, plain rolls, buns, bagels. Plain muffins, matzo. Soda, saltine, or graham crackers. Pretzels, melba toast, zwieback. Cooked cereals made with water: cornmeal, farina, cream cereals. Dry cereals: refined corn, wheat, rice. Potatoes prepared any way without skins, refined macaroni, spaghetti, noodles, refined  rice.  Avoid:  Bread, rolls, or crackers made with whole wheat, multi-grains, rye, bran seeds, nuts, or coconut. Corn tortillas or taco shells. Cereals containing whole grains, multi-grains, bran, coconut, nuts, raisins. Cooked or dry oatmeal. Coarse wheat cereals, granola. Cereals advertised as "high-fiber." Potato skins. Whole grain pasta, wild or brown rice. Popcorn. Sweet potatoes, yams. Sweet rolls, doughnuts, waffles, pancakes, sweet breads. Vegetables  Recommended: Strained tomato and vegetable juices. Most well-cooked and canned vegetables without seeds. Fresh: Tender lettuce, cucumber without the skin, cabbage, spinach, bean sprouts.  Avoid: Fresh, cooked, or canned: Artichokes, baked beans, beet greens, broccoli, Brussels sprouts, corn, kale, legumes, peas, sweet potatoes. Cooked: Green or red cabbage, spinach. Avoid large servings of any vegetables because vegetables shrink when cooked, and they contain more fiber per serving than fresh vegetables. Fruit  Recommended: Cooked or canned: Apricots, applesauce, cantaloupe, cherries, fruit cocktail, grapefruit, grapes, kiwi, mandarin oranges, peaches, pears, plums, watermelon. Fresh: Apples without skin, ripe banana, grapes, cantaloupe, cherries, grapefruit, peaches, oranges, plums. Keep servings limited to  cup or 1 piece.  Avoid: Fresh: Apples with skin, apricots, mangoes, pears, raspberries, strawberries. Prune juice, stewed or dried prunes. Dried fruits, raisins, dates. Large servings of all fresh fruits. Protein  Recommended: Ground or well-cooked tender beef, ham, veal, lamb, pork, or poultry. Eggs. Fish, oysters, shrimp, lobster, other seafoods. Liver, organ meats.  Avoid: Tough, fibrous meats with gristle. Peanut butter, smooth or chunky. Cheese, nuts, seeds, legumes, dried peas, beans, lentils. Dairy  Recommended: Yogurt, lactose-free milk, kefir, drinkable yogurt, buttermilk, soy milk, or plain hard cheese.  Avoid: Milk,  chocolate milk, beverages made with milk, such as milkshakes. Soups  Recommended: Bouillon, broth, or soups made from allowed foods. Any strained soup.  Avoid: Soups made from vegetables that are not allowed, cream or milk-based soups. Desserts and Sweets  Recommended: Sugar-free gelatin, sugar-free frozen ice pops made without sugar alcohol.  Avoid: Plain cakes and cookies, pie made with fruit, pudding, custard, cream pie. Gelatin, fruit, ice, sherbet, frozen ice pops. Ice cream, ice milk without nuts. Plain hard candy, honey, jelly, molasses, syrup, sugar, chocolate syrup, gumdrops, marshmallows. Fats and Oils  Recommended: Limit fats to less than 8 tsp per day.  Avoid: Seeds, nuts, olives, avocados. Margarine, butter, cream, mayonnaise, salad oils, plain salad dressings. Plain gravy, crisp bacon without rind. Beverages  Recommended: Water, decaffeinated teas, oral rehydration solutions, sugar-free beverages not sweetened with sugar alcohols.  Avoid: Fruit juices, caffeinated beverages (coffee, tea, soda), alcohol, sports drinks, or lemon-lime soda. Condiments  Recommended: Ketchup, mustard, horseradish, vinegar, cocoa powder. Spices in moderation: allspice, basil, bay leaves, celery powder or leaves, cinnamon, cumin powder, curry powder, ginger, mace, marjoram, onion or garlic  powder, oregano, paprika, parsley flakes, ground pepper, rosemary, sage, savory, tarragon, thyme, turmeric.  Avoid: Coconut, honey. Document Released: 05/10/2003 Document Revised: 11/12/2011 Document Reviewed: 07/04/2011 [facility] Patient Information 2014 [place].  Nausea and Vomiting Nausea is a sick feeling that often comes before throwing up (vomiting). Vomiting is a reflex where stomach contents come out of your mouth. Vomiting can cause severe loss of body fluids (dehydration). Children and elderly adults can become dehydrated quickly, especially if they also have diarrhea. Nausea and vomiting are  symptoms of a condition or disease. It is important to find the cause of your symptoms. CAUSES   Direct irritation of the stomach lining. This irritation can result from increased acid production (gastroesophageal reflux disease), infection, food poisoning, taking certain medicines (such as nonsteroidal anti-inflammatory drugs), alcohol use, or tobacco use.  Signals from the brain.These signals could be caused by a headache, heat exposure, an inner ear disturbance, increased pressure in the brain from injury, infection, a tumor, or a concussion, pain, emotional stimulus, or metabolic problems.  An obstruction in the gastrointestinal tract (bowel obstruction).  Illnesses such as diabetes, hepatitis, gallbladder problems, appendicitis, kidney problems, cancer, sepsis, atypical symptoms of a heart attack, or eating disorders.  Medical treatments such as chemotherapy and radiation.  Receiving medicine that makes you sleep (general anesthetic) during surgery. DIAGNOSIS Your caregiver may ask for tests to be done if the problems do not improve after a few days. Tests may also be done if symptoms are severe or if the reason for the nausea and vomiting is not clear. Tests may include:  Urine tests.  Blood tests.  Stool tests.  Cultures (to look for evidence of infection).  X-rays or other imaging studies. Test results can help your caregiver make decisions about treatment or the need for additional tests. TREATMENT You need to stay well hydrated. Drink frequently but in small amounts.You may wish to drink water, sports drinks, clear broth, or eat frozen ice pops or gelatin dessert to help stay hydrated.When you eat, eating slowly may help prevent nausea.There are also some antinausea medicines that may help prevent nausea. HOME CARE INSTRUCTIONS   Take all medicine as directed by your caregiver.  If you do not have an appetite, do not force yourself to eat. However, you must continue to  drink fluids.  If you have an appetite, eat a normal diet unless your caregiver tells you differently.  Eat a variety of complex carbohydrates (rice, wheat, potatoes, bread), lean meats, yogurt, fruits, and vegetables.  Avoid high-fat foods because they are more difficult to digest.  Drink enough water and fluids to keep your urine clear or pale yellow.  If you are dehydrated, ask your caregiver for specific rehydration instructions. Signs of dehydration may include:  Severe thirst.  Dry lips and mouth.  Dizziness.  Dark urine.  Decreasing urine frequency and amount.  Confusion.  Rapid breathing or pulse. SEEK IMMEDIATE MEDICAL CARE IF:   You have blood or brown flecks (like coffee grounds) in your vomit.  You have black or bloody stools.  You have a severe headache or stiff neck.  You are confused.  You have severe abdominal pain.  You have chest pain or trouble breathing.  You do not urinate at least once every 8 hours.  You develop cold or clammy skin.  You continue to vomit for longer than 24 to 48 hours.  You have a fever. MAKE SURE YOU:   Understand these instructions.  Will watch your condition.  Will get help right away if you are not doing well or get worse. Document Released: 02/17/2005 Document Revised: 05/12/2011 Document Reviewed: 07/17/2010 [facility] Patient Information 2014 [place], [place].

## 2013-05-27 NOTE — ED Notes (Signed)
Pt  Reports  Symptoms  Of  Nausea   Diarrhea        Malaise  X  2  Days      Reports  decresed  Appetite  As  Well    Denys  Any  Vomiting     Sitting  Upright   On  Exam table     Speaking in  Complete  sentances

## 2013-05-27 NOTE — ED Provider Notes (Signed)
CSN: [id number]     Arrival date & time 05/27/13  0802 History   First MD Initiated Contact with Patient 05/27/13 [phone]     Chief Complaint  Patient presents with  . Nausea   (Consider location/radiation/quality/duration/timing/severity/associated sxs/prior Treatment) HPI Comments: 53 year old morbidly obese female complaining of intermittent nausea and sometimes vomiting for the past week. Her vomiting has only been a few times. She is able to eat primarily at nighttime but this causes nausea. Yesterday she developed loose stools. Is also complaining of a right lateral, pain. She also has occasional body aches but no fever. Decreased appetite. She is able to hold down most liquids.   Past Medical History  Diagnosis Date  . GERD (gastroesophageal reflux disease)   . 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 Hypertension   . Knee injury    Past Surgical History  Procedure Laterality Date  . Cesarean section     History reviewed. No pertinent family history. History  Substance Use Topics  . Smoking status: Current Every Day Smoker -- 0.50 packs/day  . Smokeless tobacco: Not on file  . Alcohol Use: No   OB History   Grav Para Term Preterm Abortions TAB SAB Ect Mult Living                 Review of Systems  Constitutional: Positive for activity change and appetite change. Negative for fever.  HENT: Positive for congestion, postnasal drip and rhinorrhea.   Respiratory: Negative.   Cardiovascular: Negative.   Gastrointestinal: Positive for nausea, vomiting, abdominal pain and diarrhea.  Genitourinary: Negative.   Musculoskeletal: Negative.   Skin: Negative for color change and rash.  Neurological: Positive for dizziness.    Allergies  Dicyclomine  Home Medications   Current Outpatient Rx  Name  Route  Sig  Dispense  Refill  . clonazePAM (KLONOPIN) 1 MG tablet   Oral   Take 0.5-1 mg by mouth 2 (two) times daily as needed. For anxiety          . hydrochlorothiazide  (HYDRODIURIL) 25 MG tablet   Oral   Take 25 mg by mouth daily.           . ondansetron (ZOFRAN) 4 MG tablet   Oral   Take 1 tablet (4 mg total) by mouth every 6 (six) hours.   12 tablet   0   . pantoprazole (PROTONIX) 40 MG tablet   Oral   Take 40 mg by mouth daily.           . sertraline (ZOLOFT) 100 MG tablet   Oral   Take 100 mg by mouth daily.           . Vitamin D, Ergocalciferol, (DRISDOL) 50000 UNITS CAPS   Oral   Take 50,000 Units by mouth every 7 (seven) days. wednesdays           BP 144/91  Pulse 76  Temp(Src) 98.6 F (37 C) (Oral)  Resp 16  SpO2 99% Physical Exam  Nursing note and vitals reviewed. Constitutional: She is oriented to person, place, and time. She appears well-developed and well-nourished. No distress.  HENT:  Mouth/Throat: No oropharyngeal exudate.  Right TM is normal. Left TM is obstructed by cerumen in the ear canal. Oropharynx is clear other than PND.  Eyes: Conjunctivae are normal.  Neck: Normal range of motion. Neck supple.  Cardiovascular: Normal rate, regular rhythm and normal heart sounds.   Pulmonary/Chest: Effort normal and breath sounds normal.  No respiratory distress. She has no wheezes. She has no rales.  Abdominal: Soft. Bowel sounds are normal. She exhibits no distension and no mass. There is tenderness. There is no rebound and no guarding.  Tenderness in the right upper quadrant. Positive Murphy sign.  Musculoskeletal: She exhibits no edema and no tenderness.  Lymphadenopathy:    She has no cervical adenopathy.  Neurological: She is alert and oriented to person, place, and time. She exhibits normal muscle tone.  Skin: Skin is warm and dry. No rash noted.  Psychiatric: She has a normal mood and affect.    ED Course  Procedures (including critical care time) Labs Review Labs Reviewed - No data to display Imaging Review No results found.   MDM   1. Nausea   2. Diarrhea   3. RUQ abdominal pain      Most  likely etio for her sx's is cholestatic dz. Able to hold down some foods, remains nauseated. There is more URQ tenderness than actual pain. No acute abdomen. See PCP ASAP or if unable call the above numbers If worse, new sx's, increase pain, fever or other problems go to the ED promptly No greasy, fatty foods    Janne Napoleon, NP 05/27/13 [phone]

## 2013-05-29 NOTE — ED Provider Notes (Signed)
Medical screening examination/treatment/procedure(s) were performed by a resident physician or non-physician practitioner and as the supervising physician I was immediately available for consultation/collaboration.  Lynne Leader, MD    Gregor Hams, MD 05/29/13 [phone]

## 2013-06-23 ENCOUNTER — Ambulatory Visit (INDEPENDENT_AMBULATORY_CARE_PROVIDER_SITE_OTHER): Payer: BC Managed Care – PPO | Admitting: Family Medicine

## 2013-06-23 ENCOUNTER — Encounter: Payer: Self-pay | Admitting: Family Medicine

## 2013-06-23 VITALS — BP 130/82 | HR 86 | Temp 97.8°F | Ht 60.0 in | Wt 281.0 lb

## 2013-06-23 DIAGNOSIS — M25569 Pain in unspecified knee: Secondary | ICD-10-CM

## 2013-06-23 DIAGNOSIS — F341 Dysthymic disorder: Secondary | ICD-10-CM

## 2013-06-23 DIAGNOSIS — F411 Generalized anxiety disorder: Secondary | ICD-10-CM

## 2013-06-23 DIAGNOSIS — F3289 Other specified depressive episodes: Secondary | ICD-10-CM

## 2013-06-23 DIAGNOSIS — F419 Anxiety disorder, unspecified: Secondary | ICD-10-CM

## 2013-06-23 DIAGNOSIS — G8929 Other chronic pain: Secondary | ICD-10-CM

## 2013-06-23 DIAGNOSIS — F32A Depression, unspecified: Secondary | ICD-10-CM

## 2013-06-23 DIAGNOSIS — E669 Obesity, unspecified: Secondary | ICD-10-CM

## 2013-06-23 DIAGNOSIS — D259 Leiomyoma of uterus, unspecified: Secondary | ICD-10-CM

## 2013-06-23 DIAGNOSIS — K219 Gastro-esophageal reflux disease without esophagitis: Secondary | ICD-10-CM

## 2013-06-23 DIAGNOSIS — R1011 Right upper quadrant pain: Secondary | ICD-10-CM

## 2013-06-23 DIAGNOSIS — F329 Major depressive disorder, single episode, unspecified: Secondary | ICD-10-CM

## 2013-06-23 DIAGNOSIS — D219 Benign neoplasm of connective and other soft tissue, unspecified: Secondary | ICD-10-CM

## 2013-06-23 DIAGNOSIS — F410 Panic disorder [episodic paroxysmal anxiety] without agoraphobia: Secondary | ICD-10-CM

## 2013-06-23 NOTE — Progress Notes (Signed)
Pre visit review using our clinic review tool, if applicable. No additional management support is needed unless otherwise documented below in the visit note. 

## 2013-06-23 NOTE — Patient Instructions (Addendum)
-  We placed a referral for you as discussed to the psychiatrist to manage your anxiety and to the orthopedic doctor for your knee and for an ultrasound for your abdominal pain. It usually takes about 1-2 weeks to process and schedule this referral. If you have not heard from [place] regarding this appointment in 2 weeks please contact our office.  -stop the phentermine  -schedule physical and exam with gynecologist . -follow up in 1 month for physical and labs

## 2013-06-23 NOTE — Progress Notes (Signed)
No chief complaint on file.   HPI:  Frances Medina is here to establish care. Transferring from Dr. Chyrel Masson. Used to go to [facility]. Last PCP and physical: not done in several years  Has the following chronic problems and concerns today:  Patient Active Problem List   Diagnosis Date Noted  . GERD (gastroesophageal reflux disease) 06/23/2013  . Obesity 06/23/2013  . Chronic knee pain 06/23/2013  . Anxiety and depression 06/23/2013  . Fibroids 06/23/2013   Bilateral chronic knee pain: -for > 1 year -no hx of trauma -reports thinks it is from her weight -vics helps -wants to see ortho as is limiting her movement -report ibuprofen and diclofenac dosen't help  Obesity: -on phentermine - ran out about 1 week ago and doesn't want to take it as gained weight with it  GAD and Depression: -on klonopin and sertraline -takes 1/2 of klonopin 5 times per month -she wants to see the psychiatrist  RUQ pain: -after she eats, crampy intermittent pain - for years -told it might be gallbladder -wants to check [place]  Health Maintenance:  ROS: See pertinent positives and negatives per HPI.  Past Medical History  Diagnosis Date  . GERD (gastroesophageal reflux disease)   . 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 Hypertension   . Knee injury   . Depression   . Arthritis   . Frequent headaches     Family History  Problem Relation Age of Onset  . Heart disease Mother 72  . Hypertension Mother   . Stroke Father   . Hypertension Father     History   Social History  . Marital Status: Married    Spouse Name: N/A    Number of Children: N/A  . Years of Education: N/A   Social History Main Topics  . Smoking status: Former Smoker -- 0.50 packs/day  . Smokeless tobacco: None     Comment: quit in 2014, smoked for 40 years  . Alcohol Use: No  . Drug Use: No  . Sexual Activity: None   Other Topics Concern  . None   Social History Narrative   Work or School: [employer] Situation: lives with husband and daughters      Spiritual Beliefs: Christian      Lifestyle: no regular exercise; trying to eat healthy             Current outpatient prescriptions:clonazePAM (KLONOPIN) 1 MG tablet, Take 0.5-1 mg by mouth 2 (two) times daily as needed. For anxiety , Disp: , Rfl: ;  diclofenac (VOLTAREN) 75 MG EC tablet, Take 75 mg by mouth 2 (two) times daily., Disp: , Rfl: ;  sertraline (ZOLOFT) 100 MG tablet, Take 100 mg by mouth daily.  , Disp: , Rfl:   EXAM:  Filed Vitals:   06/23/13 1559  BP: 130/82  Pulse: 86  Temp: 97.8 F (36.6 C)    Body mass index is 54.88 kg/(m^2).  GENERAL: vitals reviewed and listed above, alert, oriented, appears well hydrated and in no acute distress  HEENT: atraumatic, conjunttiva clear, no obvious abnormalities on inspection of external nose and ears  NECK: no obvious masses on inspection  LUNGS: clear to auscultation bilaterally, no wheezes, rales or rhonchi, good air movement  CV: HRRR, no peripheral edema  MS: moves all extremities without noticeable abnormality  PSYCH: pleasant and cooperative, no obvious depression or anxiety  ASSESSMENT AND PLAN:  Discussed  the following assessment and plan:  RUQ pain - Plan: US Abdomen Limited RUQ  Knee pain - Plan: Ambulatory referral to Orthopedic Surgery  Generalized anxiety disorder - Plan: Ambulatory referral to Psychiatry  Panic disorder - Plan: Ambulatory referral to Psychiatry  Depression - Plan: Ambulatory referral to Psychiatry  GERD (gastroesophageal reflux disease)  Obesity  Chronic knee pain  Anxiety and depression  Fibroids  -We reviewed the PMH, PSH, FH, SH, Meds and Allergies. -We provided refills for any medications we will prescribe as needed. -We addressed current concerns per orders and patient instructions. -We have asked for records for pertinent exams, studies, vaccines and notes from previous  providers. -We have advised patient to follow up per instructions below. -follow up in 1 month for physical exam and labs  -Patient advised to return or notify a doctor immediately if symptoms worsen or persist or new concerns arise.  Patient Instructions  -We placed a referral for you as discussed to the psychiatrist to manage your anxiety and to the orthopedic doctor for your knee and for an ultrasound for your abdominal pain. It usually takes about 1-2 weeks to process and schedule this referral. If you have not heard from [place] regarding this appointment in 2 weeks please contact our office.  -stop the phentermine  -schedule physical and exam with gynecologist . -follow up in 1 month for physical and labs     [name]

## 2013-06-30 ENCOUNTER — Encounter (HOSPITAL_COMMUNITY): Payer: Self-pay | Admitting: Emergency Medicine

## 2013-06-30 ENCOUNTER — Emergency Department (HOSPITAL_COMMUNITY)
Admission: EM | Admit: 2013-06-30 | Discharge: 2013-06-30 | Disposition: A | Discharge status: Home / Self Care | Payer: BC Managed Care – PPO | Source: Home / Self Care | Attending: Emergency Medicine | Admitting: Emergency Medicine

## 2013-06-30 DIAGNOSIS — F419 Anxiety disorder, unspecified: Secondary | ICD-10-CM

## 2013-06-30 DIAGNOSIS — F411 Generalized anxiety disorder: Secondary | ICD-10-CM

## 2013-06-30 MED ORDER — CLONAZEPAM 1 MG PO TABS: 0.5000 mg | ORAL_TABLET | Freq: Every day | ORAL | Status: DC | PRN

## 2013-06-30 NOTE — Discharge Instructions (Signed)
Please follow up with your primary care or mental health doctor for further prescriptions.     Panic Attacks Panic attacks are sudden, short feelings of great fear or discomfort. You may have them for no reason when you are relaxed, when you are uneasy (anxious), or when you are sleeping.  HOME CARE  Take all your medicines as told.  Check with your doctor before starting new medicines.  Keep all doctor visits. GET HELP IF:  You are not able to take your medicines as told.  Your symptoms do not get better.  Your symptoms get worse. GET HELP RIGHT AWAY IF:  Your attacks seem different than your normal attacks.  You have thoughts about hurting yourself or others.  You take panic attack medicine and you have a side effect. MAKE SURE YOU:  Understand these instructions.  Will watch your condition.  Will get help right away if you are not doing well or get worse. Document Released: 03/22/2010 Document Revised: 12/08/2012 Document Reviewed: 10/01/2012 [facility] Patient Information 2014 [place], [place].

## 2013-06-30 NOTE — ED Notes (Signed)
Patient has run out of clonazepam.  Out for one week.  Patient has had difficulty lining up a doctor appt with when she has funds available.  Patient reports feeling itchy all over

## 2013-06-30 NOTE — ED Provider Notes (Signed)
CSN: [id number]     Arrival date & time 06/30/13  1529 History   First MD Initiated Contact with Patient 06/30/13 1603     Chief Complaint  Patient presents with  . Medication Refill   (Consider location/radiation/quality/duration/timing/severity/associated sxs/prior Treatment) HPI Comments: Pt has been on clonazapam, 1/2 of 1mg  tab daily prn for "years". Ran out a week ago, has been doing without. Only had 2 days of anxiety sx. Was unable to go to pcp sooner for refill because didn't have money and was waiting for payday, which is today. Was unable to get seen by pcp today, so is here. No change in chronic anxiety symptoms. Hx depression as well that pt feels is controlled by zoloft.   Patient is a 53 y.o. female presenting with anxiety. The history is provided by the patient.  Anxiety This is a chronic problem. Episode onset: years ago. The problem occurs every several days. The problem has not changed since onset.Associated symptoms comments: Jittery, nervous, itchy sensation. Nothing aggravates the symptoms. Nothing relieves the symptoms. Treatments tried: clonazepam. The treatment provided significant relief.    Past Medical History  Diagnosis Date  . GERD (gastroesophageal reflux disease)   . 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 Hypertension   . Knee injury   . Depression   . Arthritis   . Frequent headaches   . Anxiety    Past Surgical History  Procedure Laterality Date  . Cesarean section     Family History  Problem Relation Age of Onset  . Heart disease Mother 53  . Hypertension Mother   . Stroke Father   . Hypertension Father    History  Substance Use Topics  . Smoking status: Former Smoker -- 0.50 packs/day  . Smokeless tobacco: Not on file     Comment: quit in 2014, smoked for 40 years  . Alcohol Use: No   OB History   Grav Para Term Preterm Abortions TAB SAB Ect Mult Living                 Review of Systems  Skin:       Feels itchy  Psychiatric/Behavioral:  The patient is nervous/anxious.     Allergies  Dicyclomine  Home Medications   Prior to Admission medications   Medication Sig Start Date End Date Taking? Authorizing Provider  clonazePAM (KLONOPIN) 1 MG tablet Take 0.5-1 mg by mouth 2 (two) times daily as needed. For anxiety    Yes Historical Provider, MD  phentermine 37.5 MG capsule Take 37.5 mg by mouth every morning.   Yes Historical Provider, MD  clonazePAM (KLONOPIN) 1 MG tablet Take 0.5 tablets (0.5 mg total) by mouth daily as needed for anxiety. 06/30/13   Carvel Getting, NP  diclofenac (VOLTAREN) 75 MG EC tablet Take 75 mg by mouth 2 (two) times daily.    Historical Provider, MD  sertraline (ZOLOFT) 100 MG tablet Take 100 mg by mouth daily.      Historical Provider, MD   BP 160/86  Pulse 85  Temp(Src) 97.9 F (36.6 C) (Oral)  Resp 18  SpO2 96% Physical Exam  Constitutional: She appears well-developed and well-nourished. No distress.  Psychiatric: Her speech is normal and behavior is normal. Judgment and thought content normal. Her mood appears anxious. Cognition and memory are normal. She does not exhibit a depressed mood.    ED Course  Procedures (including critical care time) Labs Review Labs Reviewed - No data to display  Imaging Review No results found.   MDM   1. Anxiety   agreed to rx 7 tabs clonazepam 1mg , pt to take 1/2 tab daily prn anxiety. Pt must see pcp for future refills.      Carvel Getting, NP 06/30/13 [id number]

## 2013-06-30 NOTE — ED Provider Notes (Signed)
Medical screening examination/treatment/procedure(s) were performed by non-physician practitioner and as supervising physician I was immediately available for consultation/collaboration.  Philipp Deputy, M.D.  Harden Mo, MD 06/30/13 [phone]

## 2013-07-06 ENCOUNTER — Ambulatory Visit
Admission: RE | Admit: 2013-07-06 | Discharge: 2013-07-06 | Disposition: A | Discharge status: Home / Self Care | Payer: BC Managed Care – PPO | Source: Ambulatory Visit | Attending: Family Medicine | Admitting: Family Medicine

## 2013-07-06 DIAGNOSIS — R1011 Right upper quadrant pain: Secondary | ICD-10-CM

## 2013-07-21 ENCOUNTER — Encounter: Payer: Self-pay | Admitting: Family Medicine

## 2013-07-21 ENCOUNTER — Ambulatory Visit (INDEPENDENT_AMBULATORY_CARE_PROVIDER_SITE_OTHER): Payer: BC Managed Care – PPO | Admitting: Family Medicine

## 2013-07-21 ENCOUNTER — Encounter: Payer: Self-pay | Admitting: Gastroenterology

## 2013-07-21 VITALS — BP 116/80 | HR 82 | Temp 98.3°F | Ht 61.0 in | Wt 279.0 lb

## 2013-07-21 DIAGNOSIS — Z23 Encounter for immunization: Secondary | ICD-10-CM

## 2013-07-21 DIAGNOSIS — F32A Depression, unspecified: Secondary | ICD-10-CM

## 2013-07-21 DIAGNOSIS — Z1239 Encounter for other screening for malignant neoplasm of breast: Secondary | ICD-10-CM

## 2013-07-21 DIAGNOSIS — E669 Obesity, unspecified: Secondary | ICD-10-CM

## 2013-07-21 DIAGNOSIS — Z0000 Encounter for general adult medical examination without abnormal findings: Secondary | ICD-10-CM

## 2013-07-21 DIAGNOSIS — Z124 Encounter for screening for malignant neoplasm of cervix: Secondary | ICD-10-CM

## 2013-07-21 DIAGNOSIS — F341 Dysthymic disorder: Secondary | ICD-10-CM

## 2013-07-21 DIAGNOSIS — R071 Chest pain on breathing: Secondary | ICD-10-CM

## 2013-07-21 DIAGNOSIS — K219 Gastro-esophageal reflux disease without esophagitis: Secondary | ICD-10-CM

## 2013-07-21 DIAGNOSIS — Z1211 Encounter for screening for malignant neoplasm of colon: Secondary | ICD-10-CM

## 2013-07-21 DIAGNOSIS — F329 Major depressive disorder, single episode, unspecified: Secondary | ICD-10-CM

## 2013-07-21 DIAGNOSIS — F419 Anxiety disorder, unspecified: Secondary | ICD-10-CM

## 2013-07-21 DIAGNOSIS — R0789 Other chest pain: Secondary | ICD-10-CM

## 2013-07-21 LAB — BASIC METABOLIC PANEL
BUN: 18 mg/dL (ref 6–23)
CO2: 26 mEq/L (ref 19–32)
Calcium: 9.3 mg/dL (ref 8.4–10.5)
Chloride: 109 mEq/L (ref 96–112)
Creatinine, Ser: 0.7 mg/dL (ref 0.4–1.2)
GFR: 112.93 mL/min (ref >60.00)
Glucose, Bld: 96 mg/dL (ref 70–99)
Potassium: 4.3 mEq/L (ref 3.5–5.1)
Sodium: 142 mEq/L (ref 135–145)

## 2013-07-21 LAB — LIPID PANEL
Cholesterol: 152 mg/dL (ref 0–200)
HDL: 48.3 mg/dL (ref >39.00)
LDL Cholesterol: 96 mg/dL (ref 0–99)
Total CHOL/HDL Ratio: 3
Triglycerides: 39 mg/dL (ref 0.0–149.0)
VLDL: 7.8 mg/dL (ref 0.0–40.0)

## 2013-07-21 LAB — HEMOGLOBIN A1C: Hgb A1c MFr Bld: 6.1 % (ref 4.6–6.5)

## 2013-07-21 MED ORDER — TETANUS-DIPHTH-ACELL PERTUSSIS 5-2.5-18.5 LF-MCG/0.5 IM SUSP: 0.5000 mL | Freq: Once | INTRAMUSCULAR | Status: DC

## 2013-07-21 NOTE — Progress Notes (Signed)
No chief complaint on file.   HPI:  Here for CPE:  -Concerns today/follow up:  Anxiety and Depression: -referred to psychiatry, advised her I would not rx benzos chronically, she went to ED for benzos since - thought told me takes 1/2 tab maybe 5 times per month -also takes sertraline -she is going to call and set this up  RUQ Pain: -ordered US last visit - ok, fatty liver -continues to have RUQ pain intermittently - she thinks it is from her fibroids as told was this in the past  Knee OA: -Seeing ortho for her knee and getting knee surgery  -Diet: variety of foods, balance and well rounded, larger portion sizes  -Taking folic acid: no  -Exercise: no regular exercise  -Diabetes and Dyslipidemia Screening:  -Hx of HTN: no  -Vaccines: needs tdap today  -pap history: fibroids, told me last visit was scheduling gyn visit with gynecologist - she reports she will schedule this  -FDLMP:  -sexual activity: yes, female partner, no new partners  -wants STI testing: no  -FH breast, colon or ovarian ca: see FH  -Alcohol, Tobacco, drug use: see social history  Review of Systems - Review of Systems  Constitutional: Negative for fever, weight loss and malaise/fatigue.  HENT: Negative for ear pain and hearing loss.   Eyes: Negative for blurred vision and double vision.  Respiratory: Negative for shortness of breath and wheezing.   Cardiovascular: Negative for chest pain.  Gastrointestinal: Positive for heartburn. Negative for vomiting, diarrhea, blood in stool and melena.  Genitourinary: Negative for dysuria, urgency and frequency.  Musculoskeletal: Negative for falls.  Skin: Negative for rash.  Neurological: Negative for dizziness, seizures and loss of consciousness.  Endo/Heme/Allergies: Does not bruise/bleed easily.  Psychiatric/Behavioral: Negative for depression, suicidal ideas and memory loss. The patient is nervous/anxious.      Past Medical History  Diagnosis Date   . GERD (gastroesophageal reflux disease)   . 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 Hypertension   . Knee injury   . Depression   . Arthritis   . Frequent headaches   . Anxiety     Past Surgical History  Procedure Laterality Date  . Cesarean section      Family History  Problem Relation Age of Onset  . Heart disease Mother 34  . Hypertension Mother   . Stroke Father   . Hypertension Father     History   Social History  . Marital Status: Married    Spouse Name: N/A    Number of Children: N/A  . Years of Education: N/A   Social History Main Topics  . Smoking status: Former Smoker -- 0.50 packs/day  . Smokeless tobacco: None     Comment: quit in 2014, smoked for 40 years  . Alcohol Use: No  . Drug Use: No  . Sexual Activity: None   Other Topics Concern  . None   Social History Narrative   Work or School: [employer] Situation: lives with husband and daughters      Spiritual Beliefs: Christian      Lifestyle: no regular exercise; trying to eat healthy             Current outpatient prescriptions:clonazePAM (KLONOPIN) 1 MG tablet, Take 0.5 tablets (0.5 mg total) by mouth daily as needed for anxiety., Disp: 7 tablet, Rfl: 0;  diclofenac (VOLTAREN) 75 MG EC tablet, Take 75 mg by mouth 2 (two)  times daily., Disp: , Rfl: ;  sertraline (ZOLOFT) 100 MG tablet, Take 100 mg by mouth daily.  , Disp: , Rfl:   EXAM:  Filed Vitals:   07/21/13 0808  BP: 116/80  Pulse: 82  Temp: 98.3 F (36.8 C)    GENERAL: vitals reviewed and listed below, alert, oriented, appears well hydrated and in no acute distress  HEENT: head atraumatic, PERRLA, normal appearance of eyes, ears, nose and mouth. moist mucus membranes.  NECK: supple, no masses or lymphadenopathy  LUNGS: clear to auscultation bilaterally, no rales, rhonchi or wheeze  CV: HRRR, no peripheral edema or cyanosis, normal pedal pulses  BREAST: reports she will see gyn for  his  ABDOMEN: bowel sounds normal, soft, non tender to palpation, no masses, no rebound or guarding - she does have some bilat TTP under bra band in soft tissues  GU: will see gyn  RECTAL: refused  SKIN: no rash or abnormal lesions  MS: normal gait, moves all extremities normally  NEURO: CN II-XII grossly intact, normal muscle strength and sensation to light touch on extremities  PSYCH: normal affect, pleasant and cooperative  ASSESSMENT AND PLAN:  Discussed the following assessment and plan:  Visit for preventive health examination - Plan: Lipid Panel, Hemoglobin R4E, Basic metabolic panel  Colon cancer screening - Plan: Ambulatory referral to Gastroenterology  Breast cancer screening -she is to schedule  Cervical cancer screening -she wishes to gyn, advised to call to set up appt  Anxiety and depression -she is going to see psych  Obesity -advised diet and exercise  GERD (gastroesophageal reflux disease)  Chest wall pain -I think her"RUQ pain" is likely bra compression - but offered GI referral she will call if wants   Orders Placed This Encounter  Procedures  . Lipid Panel  . Hemoglobin A1c  . Basic metabolic panel  . Ambulatory referral to Gastroenterology    Referral Priority:  Routine    Referral Type:  Consultation    Referral Reason:  Specialty Services Required    Requested Specialty:  Gastroenterology    Number of Visits Requested:  1    Patient Instructions  -schedule appointment with the gynecologist  -schedule your mammogram  -schedule appointment with the psychiatrist  -1000 IU Vit D3 daily; Calcium 1200mg  (probably half of this comes from your diet)  -let me know if you wish to see the gastroenterologist regarding your chronic abdominal pain  We recommend the following healthy lifestyle measures: - eat a healthy diet consisting of lots of vegetables, fruits, beans, nuts, seeds, healthy meats such as white chicken and fish and whole  grains.  - avoid fried foods, fast food, processed foods, sodas, red meet and other fattening foods.  - get a least 150 minutes of aerobic exercise per week.   Follow up in 4-6 months    Patient advised to return to clinic immediately if symptoms worsen or persist or new concerns.   Return in about 5 days (around 07/26/2013) for follow up.  Frances Medina

## 2013-07-21 NOTE — Patient Instructions (Signed)
-  schedule appointment with the gynecologist  -schedule your mammogram  -schedule appointment with the psychiatrist  -1000 IU Vit D3 daily; Calcium 1200mg  (probably half of this comes from your diet)  -let me know if you wish to see the gastroenterologist regarding your chronic abdominal pain  We recommend the following healthy lifestyle measures: - eat a healthy diet consisting of lots of vegetables, fruits, beans, nuts, seeds, healthy meats such as white chicken and fish and whole grains.  - avoid fried foods, fast food, processed foods, sodas, red meet and other fattening foods.  - get a least 150 minutes of aerobic exercise per week.   Follow up in 4-6 months

## 2013-07-21 NOTE — Progress Notes (Signed)
Pre visit review using our clinic review tool, if applicable. No additional management support is needed unless otherwise documented below in the visit note. 

## 2013-08-01 ENCOUNTER — Ambulatory Visit (INDEPENDENT_AMBULATORY_CARE_PROVIDER_SITE_OTHER): Payer: BC Managed Care – PPO | Admitting: Family Medicine

## 2013-08-01 ENCOUNTER — Other Ambulatory Visit: Payer: Self-pay | Admitting: Orthopedic Surgery

## 2013-08-01 ENCOUNTER — Encounter: Payer: Self-pay | Admitting: Family Medicine

## 2013-08-01 VITALS — BP 108/80 | HR 100 | Temp 98.2°F | Ht 61.0 in | Wt 277.5 lb

## 2013-08-01 DIAGNOSIS — G8929 Other chronic pain: Secondary | ICD-10-CM

## 2013-08-01 DIAGNOSIS — E669 Obesity, unspecified: Secondary | ICD-10-CM

## 2013-08-01 DIAGNOSIS — Z01818 Encounter for other preprocedural examination: Secondary | ICD-10-CM

## 2013-08-01 DIAGNOSIS — M25569 Pain in unspecified knee: Secondary | ICD-10-CM

## 2013-08-01 NOTE — Progress Notes (Signed)
No chief complaint on file.   HPI:  Patient is seen for optimization of general medical care prior to surgery. Surgery type: R knee replacement Date of surgery: 08/30/13  Kidney disease? No Prior surgeries/Issues following anesthesia? C/S x2, no issues Hx MI, heart arrythmia, CHF, angina or stroke? none Epilepsy or Seizures? none Arthritis or problems with neck or jaw? none Thyroid disease? none Liver disease? none Asthma, COPD or chronic lung disease? none Diabetes? none (Needs to be evaluated by anesthesia if yes to these questions.)  Other: Poor nutrition, Frail or other: no  METS:  ?Can take care of self, such as eat, dress, or use the toilet (1 MET). yes ?Can walk up a flight of steps or a hill (4 METs).yes ?Can do heavy work around the house such as scrubbing floors or lifting or moving heavy furniture (between 4 and 10 METs). yes ?Can participate in strenuous sports such as swimming, singles tennis, football, basketball, and skiing (>10 METs) she reports yes . AHA Risks: Major predictors that require intensive management and may lead to delay in or cancellation of the operative procedure unless emergent: NONE   Other clinical predictors that warrant careful assessment of current status: NONE  Type of surgery and Risk:   Intermediate risk (reported risk of cardiac death or nonfatal MI generally 1 to 5 percent): [name] Orthopedic surgery   Medications that need to be addressed prior to surgery: None  Discontinue acei/arbs/non-statin lipid lowering drugs day of surgery ASA stop 7 days before or discuss with cardiology if CV risks, other anticoagulants discuss with cardiology. Her surgeon has gone over her medications with her and she knows she needs to sto pthe voltaren one week before and the other medications 2 days before.  ROS: See pertinent positives and negatives per HPI. 11 point ROS negative except where noted. Review of Systems  Constitutional: Negative for  fever, weight loss and malaise/fatigue.  HENT: Negative for ear pain.   Eyes: Negative for blurred vision, double vision and pain.  Respiratory: Negative for cough, shortness of breath and wheezing.   Cardiovascular: Negative for chest pain, palpitations and leg swelling.  Gastrointestinal: Negative for vomiting, abdominal pain, diarrhea, blood in stool and melena.  Genitourinary: Negative for dysuria, urgency and frequency.  Musculoskeletal: Negative for falls and neck pain.  Skin: Negative for itching and rash.  Neurological: Negative for dizziness, seizures and headaches.  Endo/Heme/Allergies: Does not bruise/bleed easily.  Psychiatric/Behavioral: Negative for depression, suicidal ideas and memory loss. The patient is not nervous/anxious.      Past Medical History  Diagnosis Date  . GERD (gastroesophageal reflux disease)   . 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 Hypertension   . Knee injury   . Depression   . Arthritis   . Frequent headaches   . Anxiety     Past Surgical History  Procedure Laterality Date  . Cesarean section      Family History  Problem Relation Age of Onset  . Heart disease Mother 56  . Hypertension Mother   . Stroke Father   . Hypertension Father     History   Social History  . Marital Status: Married    Spouse Name: N/A    Number of Children: N/A  . Years of Education: N/A   Social History Main Topics  . Smoking status: Former Smoker -- 0.50 packs/day  . Smokeless tobacco: None     Comment: quit in 2014, smoked for 40 years  . Alcohol  Use: No  . Drug Use: No  . Sexual Activity: None   Other Topics Concern  . None   Social History Narrative   Work or School: [employer] Situation: lives with husband and daughters      Spiritual Beliefs: Christian      Lifestyle: no regular exercise; trying to eat healthy             Current outpatient prescriptions:clonazePAM (KLONOPIN) 1 MG tablet, Take 0.5  tablets (0.5 mg total) by mouth daily as needed for anxiety., Disp: 7 tablet, Rfl: 0;  diclofenac (VOLTAREN) 75 MG EC tablet, Take 75 mg by mouth 2 (two) times daily., Disp: , Rfl: ;  sertraline (ZOLOFT) 100 MG tablet, Take 100 mg by mouth daily.  , Disp: , Rfl:   EXAM:  Filed Vitals:   08/01/13 1437  BP: 108/80  Pulse: 100  Temp: 98.2 F (36.8 C)    Body mass index is 52.46 kg/(m^2).  GENERAL: vitals reviewed and listed above, alert, oriented, appears well hydrated and in no acute distress  HEENT: atraumatic, conjunttiva clear, no obvious abnormalities on inspection of external nose and ears  NECK: no obvious masses on inspection, no carotid bruits  LUNGS: clear to auscultation bilaterally, no wheezes, rales or rhonchi, good air movement  CV: HRRR, no peripheral edema, no JVD, BP normal range, normal radial pulses  MS: moves all extremities without noticeable abnormality  PSYCH: pleasant and cooperative, no obvious depression or anxiety  ASSESSMENT AND PLAN:  Discussed the following assessment and plan:  Pre-op evaluation  Knee pain  Obesity  Chronic knee pain  Assessment: -Risk factors: none -Surgery Risks:intermediate -age, nutritional status, fraility: good nutritional status, age >24, no fraility -functional capacity: > 10 METs wethout symptoms -comorbidities: obesity Patient Specific Risks: patient is low risk for intermediate risks surgery   Recommendations for optimizing general medical care prior to surgery: -advised patient to discuss specific risks morbidity and mortality of surgery with surgeon, CV risks discussed with patient - including possibility of death and she wishes to proceed -advised patient to follow instructions for medications provided by her surgery department -pre-op labs and EKG per surgeon if part of regular preop preparation  -advised patient will defer to surgeon for post-op DVT prophylaxis and post op care -no further specific  medical recommendations for this patient at this time and no recommendations to defer surgery or for further CV testing prior to surgery -ensure anesthesiologist aware of her psychiatric medications prior to surgery -form for pre-op optimization of general medical care prior to surgery faxed to surgeon office  -Patient advised to return or notify a doctor immediately if symptoms worsen or persist or new concerns arise.  There are no Patient Instructions on file for this visit.   Frances Medina

## 2013-08-01 NOTE — Progress Notes (Signed)
Pre visit review using our clinic review tool, if applicable. No additional management support is needed unless otherwise documented below in the visit note. 

## 2013-08-16 ENCOUNTER — Telehealth: Payer: Self-pay

## 2013-08-16 NOTE — Telephone Encounter (Signed)
As of 08-01-13 pt's BMI >50 (52.46).  Does not qualify for [place] per anesthesia.  Please schedule pt at hospital.

## 2013-08-16 NOTE — Telephone Encounter (Signed)
Patient about to have knee surgery on 08/30/13.  She may not be ready for colonoscopy for Dr. Lynne Leader next available hospital week 7/20. Please have someone discuss with her at the pre-visit before I set up at the hospital.

## 2013-08-17 ENCOUNTER — Ambulatory Visit (AMBULATORY_SURGERY_CENTER): Payer: Self-pay | Admitting: *Deleted

## 2013-08-17 VITALS — Ht 61.0 in | Wt 279.6 lb

## 2013-08-17 DIAGNOSIS — Z1211 Encounter for screening for malignant neoplasm of colon: Secondary | ICD-10-CM

## 2013-08-17 MED ORDER — MOVIPREP 100 G PO SOLR: ORAL | Status: DC

## 2013-08-17 NOTE — Progress Notes (Signed)
Patient denies any allergies to eggs or soy. Patient denies any problems with anesthesia/sedation. Patient denies any oxygen use at home, pt does take diet medication phentermine, explained to patient that she will need to stop this medication 10 days prior to colonoscopy, pt verbalizes understanding. EMMI education assisgned to patient on colonoscopy, this was explained and instructions given to patient.

## 2013-08-17 NOTE — Telephone Encounter (Signed)
Explained this to patient. Patient will call us back to schedule colonoscopy after her surgery. Patient aware not to take phentermine 10 days prior and aware her procedure will be done at [place]. Patient verbalizes understanding.

## 2013-08-18 ENCOUNTER — Encounter (HOSPITAL_COMMUNITY): Payer: Self-pay | Admitting: Pharmacy Technician

## 2013-08-18 NOTE — Pre-Procedure Instructions (Signed)
Frances Medina  08/18/2013   Your procedure is scheduled on: Tuesday, June 30.  Report to [facility] Admitting at 5:30AM.  Call this number if you have problems the morning of surgery: [phone]   Remember:   Do not eat food or drink liquids after midnight Monday, June 29.  Take these medicines the morning of surgery with A SIP OF WATER: cetirizine (ZYRTEC), sertraline (ZOLOFT).             Take if needed:clonazePAM [facility]).                 Stop phentermine 2 weeks prior to surgery.               Stop  diclofenac (VOLTAREN) 1 week prior to surgery.   Do not wear jewelry, make-up or nail polish.  Do not wear lotions, powders, or perfumes.   Do not shave 48 hours prior to surgery.   Do not bring valuables to the hospital.               [facility] is not responsible  for any belongings or valuables.               Contacts, dentures or bridgework may not be worn into surgery.  Leave suitcase in the car. After surgery it may be brought to your room.  For patients admitted to the hospital, discharge time is determined by you treatment team.               Patients discharged the day of surgery will not be allowed to drive home.  Name and phone number of your driver: -   Special Instructions: -   Please read over the following fact sheets that you were given: Pain Booklet, Coughing and Deep Breathing and Surgical Site Infection Prevention

## 2013-08-19 ENCOUNTER — Ambulatory Visit (HOSPITAL_COMMUNITY)
Admission: RE | Admit: 2013-08-19 | Discharge: 2013-08-19 | Disposition: A | Discharge status: Home / Self Care | Payer: BC Managed Care – PPO | Source: Ambulatory Visit | Attending: Anesthesiology | Admitting: Anesthesiology

## 2013-08-19 ENCOUNTER — Encounter (HOSPITAL_COMMUNITY): Payer: Self-pay

## 2013-08-19 ENCOUNTER — Encounter (HOSPITAL_COMMUNITY)
Admission: RE | Admit: 2013-08-19 | Discharge: 2013-08-19 | Disposition: A | Discharge status: Home / Self Care | Payer: BC Managed Care – PPO | Source: Ambulatory Visit | Attending: Orthopedic Surgery | Admitting: Orthopedic Surgery

## 2013-08-19 DIAGNOSIS — Z87891 Personal history of nicotine dependence: Secondary | ICD-10-CM | POA: Insufficient documentation

## 2013-08-19 DIAGNOSIS — Z01818 Encounter for other preprocedural examination: Secondary | ICD-10-CM | POA: Insufficient documentation

## 2013-08-19 DIAGNOSIS — M1710 Unilateral primary osteoarthritis, unspecified knee: Secondary | ICD-10-CM | POA: Insufficient documentation

## 2013-08-19 HISTORY — DX: Leiomyoma of uterus, unspecified: D25.9

## 2013-08-19 HISTORY — DX: Other seasonal allergic rhinitis: J30.2

## 2013-08-19 HISTORY — DX: Umbilical hernia without obstruction or gangrene: K42.9

## 2013-08-19 LAB — CBC
HCT: 39.4 % (ref 36.0–46.0)
Hemoglobin: 13 g/dL (ref 12.0–15.0)
MCH: 24.9 pg — ABNORMAL LOW (ref 26.0–34.0)
MCHC: 33 g/dL (ref 30.0–36.0)
MCV: 75.5 fL — ABNORMAL LOW (ref 78.0–100.0)
Platelets: 155 10*3/uL (ref 150–400)
RBC: 5.22 MIL/uL — ABNORMAL HIGH (ref 3.87–5.11)
RDW: 16.7 % — ABNORMAL HIGH (ref 11.5–15.5)
WBC: 7.7 10*3/uL (ref 4.0–10.5)

## 2013-08-19 LAB — BASIC METABOLIC PANEL
BUN: 14 mg/dL (ref 6–23)
CO2: 28 mEq/L (ref 19–32)
Calcium: 9.6 mg/dL (ref 8.4–10.5)
Chloride: 102 mEq/L (ref 96–112)
Creatinine, Ser: 0.67 mg/dL (ref 0.50–1.10)
GFR calc Af Amer: >90 mL/min (ref >90)
GFR calc non Af Amer: >90 mL/min (ref >90)
Glucose, Bld: 86 mg/dL (ref 70–99)
Potassium: 4 mEq/L (ref 3.7–5.3)
Sodium: 142 mEq/L (ref 137–147)

## 2013-08-19 LAB — SURGICAL PCR SCREEN
MRSA, PCR: NEGATIVE
Staphylococcus aureus: POSITIVE — AB

## 2013-08-19 LAB — PROTIME-INR
INR: 0.9 (ref 0.00–1.49)
Prothrombin Time: 12 seconds (ref 11.6–15.2)

## 2013-08-19 LAB — APTT: aPTT: 49 seconds — ABNORMAL HIGH (ref 24–37)

## 2013-08-19 LAB — HCG, SERUM, QUALITATIVE: Preg, Serum: NEGATIVE

## 2013-08-19 NOTE — Progress Notes (Signed)
Patient made aware that nasal swab tested positive for staph. Script was called to pharmacy at [phone]. She verbalized understanding on usage.

## 2013-08-19 NOTE — Progress Notes (Signed)
Patient has a positive antibody screen and will need type and screen day of surgery.

## 2013-08-23 NOTE — Progress Notes (Addendum)
Anesthesia Chart Review:  Patient is a 53 year old female scheduled for right unicompartmental knee arthroplasty on 08/30/13 by Dr. Mardelle Matte.  History includes GERD, HTN, anxiety, depression, umbilical hernia, former smoker, uterine fibroid tumors, arthritis, frequent headaches, oophorectomy, c-section, D&C.  BMI is consistent with morbid obesity. PCP is Dr. Colin Benton who felt patient was low risk for intermediate risk surgery.  See 08/01/13 note in Epic.    Meds: Zyrtec, Klonopin, Voltaren, phentermine, sertraline, Moviprep. She told on 08/18/13 (PAT appointment) to stop phentermine 2 weeks prior to surgery and diclofenac 1 week prior to surgery.  EKG on 08/19/13 showed NSR, low voltage QRS.  CXR on 08/19/13 showed: No active cardiopulmonary disease.  Preoperative labs noted.  PTT is elevated at 49. PT/INR and PLT WNL. T&S with antibodies, so additional specimen is planned on the day of surgery. I have left a voice message with Sherri at Dr. Luanna Cole office regarding patient's elevated PTT so Dr. Mardelle Matte can make additional recommendations. For now, I will plan to repeat her PTT on the day of surgery. I do not see that a preoperative UA was ordered by the surgeon.  George Hugh [facility] Short Stay Center/Anesthesiology Phone [phone] 08/23/2013 10:47 AM  Addendum: 08/24/2013 4:00 PM I spoke with Sherri at Dr. Luanna Cole office re: elevated PTT and plans to repeat on the day of surgery.  He is in the office today, and she will have him review.  She will let me know if he has additional recommendations or wishes to have PTT rechecked sooner.

## 2013-08-26 ENCOUNTER — Encounter: Payer: BC Managed Care – PPO | Admitting: Gastroenterology

## 2013-08-30 ENCOUNTER — Encounter (HOSPITAL_COMMUNITY): Payer: Self-pay | Admitting: Surgery

## 2013-08-30 ENCOUNTER — Inpatient Hospital Stay (HOSPITAL_COMMUNITY)
Admission: RE | Admit: 2013-08-30 | Discharge: 2013-08-31 | DRG: 470 | Disposition: A | Discharge status: Home Health | Payer: BC Managed Care – PPO | Source: Ambulatory Visit | Attending: Orthopedic Surgery | Admitting: Orthopedic Surgery

## 2013-08-30 ENCOUNTER — Inpatient Hospital Stay (HOSPITAL_COMMUNITY): Payer: BC Managed Care – PPO | Admitting: Certified Registered Nurse Anesthetist

## 2013-08-30 ENCOUNTER — Inpatient Hospital Stay (HOSPITAL_COMMUNITY): Payer: BC Managed Care – PPO

## 2013-08-30 ENCOUNTER — Encounter (HOSPITAL_COMMUNITY)
Admission: RE | Disposition: A | Discharge status: Home Health | Payer: Self-pay | Source: Ambulatory Visit | Attending: Orthopedic Surgery

## 2013-08-30 ENCOUNTER — Encounter (HOSPITAL_COMMUNITY): Payer: BC Managed Care – PPO | Admitting: Vascular Surgery

## 2013-08-30 DIAGNOSIS — I10 Essential (primary) hypertension: Secondary | ICD-10-CM | POA: Diagnosis present

## 2013-08-30 DIAGNOSIS — Z881 Allergy status to other antibiotic agents status: Secondary | ICD-10-CM

## 2013-08-30 DIAGNOSIS — M1711 Unilateral primary osteoarthritis, right knee: Secondary | ICD-10-CM | POA: Diagnosis present

## 2013-08-30 DIAGNOSIS — Z87891 Personal history of nicotine dependence: Secondary | ICD-10-CM

## 2013-08-30 DIAGNOSIS — Z823 Family history of stroke: Secondary | ICD-10-CM

## 2013-08-30 DIAGNOSIS — Z880 Allergy status to penicillin: Secondary | ICD-10-CM

## 2013-08-30 DIAGNOSIS — M1710 Unilateral primary osteoarthritis, unspecified knee: Principal | ICD-10-CM | POA: Diagnosis present

## 2013-08-30 DIAGNOSIS — F411 Generalized anxiety disorder: Secondary | ICD-10-CM | POA: Diagnosis present

## 2013-08-30 DIAGNOSIS — F329 Major depressive disorder, single episode, unspecified: Secondary | ICD-10-CM | POA: Diagnosis present

## 2013-08-30 DIAGNOSIS — Z8249 Family history of ischemic heart disease and other diseases of the circulatory system: Secondary | ICD-10-CM

## 2013-08-30 DIAGNOSIS — F3289 Other specified depressive episodes: Secondary | ICD-10-CM | POA: Diagnosis present

## 2013-08-30 DIAGNOSIS — K219 Gastro-esophageal reflux disease without esophagitis: Secondary | ICD-10-CM | POA: Diagnosis present

## 2013-08-30 HISTORY — PX: PARTIAL KNEE ARTHROPLASTY: SHX2174

## 2013-08-30 HISTORY — DX: Migraine, unspecified, not intractable, without status migrainosus: G43.909

## 2013-08-30 HISTORY — DX: Unilateral primary osteoarthritis, right knee: M17.11

## 2013-08-30 LAB — APTT: aPTT: 47 seconds — ABNORMAL HIGH (ref 24–37)

## 2013-08-30 SURGERY — ARTHROPLASTY, KNEE, UNICOMPARTMENTAL
Anesthesia: General | Site: Knee | Laterality: Right

## 2013-08-30 MED ORDER — DEXAMETHASONE SODIUM PHOSPHATE 4 MG/ML IJ SOLN
INTRAMUSCULAR | Status: AC
  Filled 2013-08-30: qty 1

## 2013-08-30 MED ORDER — LIDOCAINE HCL (CARDIAC) 20 MG/ML IV SOLN
INTRAVENOUS | Status: AC
  Filled 2013-08-30: qty 5

## 2013-08-30 MED ORDER — DEXAMETHASONE SODIUM PHOSPHATE 10 MG/ML IJ SOLN
INTRAMUSCULAR | Status: DC | PRN
  Administered 2013-08-30: 4 mg via INTRAVENOUS

## 2013-08-30 MED ORDER — NEOSTIGMINE METHYLSULFATE 10 MG/10ML IV SOLN
INTRAVENOUS | Status: DC | PRN
  Administered 2013-08-30 (×2): 2.5 mg via INTRAVENOUS

## 2013-08-30 MED ORDER — DEXTROSE 5 % IV SOLN
3.0000 g | Freq: Four times a day (QID) | INTRAVENOUS | Status: AC
  Administered 2013-08-30 (×2): 3 g via INTRAVENOUS
  Filled 2013-08-30 (×2): qty 3000

## 2013-08-30 MED ORDER — HYDROMORPHONE HCL PF 1 MG/ML IJ SOLN
INTRAMUSCULAR | Status: AC
  Filled 2013-08-30: qty 1

## 2013-08-30 MED ORDER — OXYCODONE-ACETAMINOPHEN 10-325 MG PO TABS: 1.0000 | ORAL_TABLET | Freq: Four times a day (QID) | ORAL | Status: DC | PRN

## 2013-08-30 MED ORDER — LACTATED RINGERS IV SOLN
INTRAVENOUS | Status: DC | PRN
  Administered 2013-08-30 (×2): via INTRAVENOUS

## 2013-08-30 MED ORDER — ALUM & MAG HYDROXIDE-SIMETH 200-200-20 MG/5ML PO SUSP: 30.0000 mL | ORAL | Status: DC | PRN

## 2013-08-30 MED ORDER — MAGNESIUM CITRATE PO SOLN: 1.0000 | Freq: Once | ORAL | Status: AC | PRN

## 2013-08-30 MED ORDER — POLYETHYLENE GLYCOL 3350 17 G PO PACK: 17.0000 g | PACK | Freq: Every day | ORAL | Status: DC | PRN

## 2013-08-30 MED ORDER — KETOROLAC TROMETHAMINE 15 MG/ML IJ SOLN
INTRAMUSCULAR | Status: AC
  Filled 2013-08-30: qty 1

## 2013-08-30 MED ORDER — BISACODYL 10 MG RE SUPP: 10.0000 mg | Freq: Every day | RECTAL | Status: DC | PRN

## 2013-08-30 MED ORDER — OXYCODONE HCL 5 MG PO TABS
5.0000 mg | ORAL_TABLET | ORAL | Status: DC | PRN
  Administered 2013-08-30 – 2013-08-31 (×4): 10 mg via ORAL
  Filled 2013-08-30 (×4): qty 2

## 2013-08-30 MED ORDER — METHOCARBAMOL 1000 MG/10ML IJ SOLN
500.0000 mg | Freq: Four times a day (QID) | INTRAVENOUS | Status: DC | PRN
  Filled 2013-08-30: qty 5

## 2013-08-30 MED ORDER — CEFAZOLIN SODIUM 10 G IJ SOLR
3.0000 g | INTRAMUSCULAR | Status: AC
  Administered 2013-08-30: 3 g via INTRAVENOUS
  Filled 2013-08-30: qty 3000

## 2013-08-30 MED ORDER — MENTHOL 3 MG MT LOZG: 1.0000 | LOZENGE | OROMUCOSAL | Status: DC | PRN

## 2013-08-30 MED ORDER — SERTRALINE HCL 100 MG PO TABS
100.0000 mg | ORAL_TABLET | Freq: Every day | ORAL | Status: DC
  Administered 2013-08-31: 100 mg via ORAL
  Filled 2013-08-30 (×2): qty 1

## 2013-08-30 MED ORDER — PHENOL 1.4 % MT LIQD: 1.0000 | OROMUCOSAL | Status: DC | PRN

## 2013-08-30 MED ORDER — FENTANYL CITRATE 0.05 MG/ML IJ SOLN
INTRAMUSCULAR | Status: DC | PRN
  Administered 2013-08-30 (×5): 50 ug via INTRAVENOUS

## 2013-08-30 MED ORDER — ONDANSETRON HCL 4 MG/2ML IJ SOLN
INTRAMUSCULAR | Status: AC
  Filled 2013-08-30: qty 2

## 2013-08-30 MED ORDER — PROPOFOL 10 MG/ML IV BOLUS
INTRAVENOUS | Status: AC
  Filled 2013-08-30: qty 20

## 2013-08-30 MED ORDER — PROPOFOL 10 MG/ML IV BOLUS
INTRAVENOUS | Status: DC | PRN
  Administered 2013-08-30: 200 mg via INTRAVENOUS

## 2013-08-30 MED ORDER — ARTIFICIAL TEARS OP OINT
TOPICAL_OINTMENT | OPHTHALMIC | Status: DC | PRN
  Administered 2013-08-30: 1 via OPHTHALMIC

## 2013-08-30 MED ORDER — ONDANSETRON HCL 4 MG PO TABS: 4.0000 mg | ORAL_TABLET | Freq: Four times a day (QID) | ORAL | Status: DC | PRN

## 2013-08-30 MED ORDER — KETOROLAC TROMETHAMINE 15 MG/ML IJ SOLN
7.5000 mg | Freq: Four times a day (QID) | INTRAMUSCULAR | Status: AC
  Administered 2013-08-30 – 2013-08-31 (×4): 7.5 mg via INTRAVENOUS

## 2013-08-30 MED ORDER — CLONAZEPAM 0.5 MG PO TABS
0.5000 mg | ORAL_TABLET | Freq: Every day | ORAL | Status: DC | PRN
  Administered 2013-08-31: 0.5 mg via ORAL
  Filled 2013-08-30: qty 1

## 2013-08-30 MED ORDER — EPHEDRINE SULFATE 50 MG/ML IJ SOLN
INTRAMUSCULAR | Status: AC
  Filled 2013-08-30: qty 1

## 2013-08-30 MED ORDER — DOCUSATE SODIUM 100 MG PO CAPS
100.0000 mg | ORAL_CAPSULE | Freq: Two times a day (BID) | ORAL | Status: DC
  Administered 2013-08-30 – 2013-08-31 (×2): 100 mg via ORAL
  Filled 2013-08-30 (×2): qty 1

## 2013-08-30 MED ORDER — ACETAMINOPHEN 325 MG PO TABS: 650.0000 mg | ORAL_TABLET | Freq: Four times a day (QID) | ORAL | Status: DC | PRN

## 2013-08-30 MED ORDER — MIDAZOLAM HCL 5 MG/5ML IJ SOLN
INTRAMUSCULAR | Status: DC | PRN
  Administered 2013-08-30 (×2): 1 mg via INTRAVENOUS

## 2013-08-30 MED ORDER — PHENTERMINE HCL 37.5 MG PO CAPS
37.5000 mg | ORAL_CAPSULE | Freq: Every day | ORAL | Status: DC
  Filled 2013-08-30: qty 1

## 2013-08-30 MED ORDER — METHOCARBAMOL 500 MG PO TABS: 500.0000 mg | ORAL_TABLET | Freq: Four times a day (QID) | ORAL | Status: DC

## 2013-08-30 MED ORDER — HYDROMORPHONE HCL PF 1 MG/ML IJ SOLN
0.2500 mg | INTRAMUSCULAR | Status: DC | PRN
  Administered 2013-08-30 (×2): 0.5 mg via INTRAVENOUS

## 2013-08-30 MED ORDER — MIDAZOLAM HCL 2 MG/2ML IJ SOLN
INTRAMUSCULAR | Status: AC
  Filled 2013-08-30: qty 2

## 2013-08-30 MED ORDER — FENTANYL CITRATE 0.05 MG/ML IJ SOLN
INTRAMUSCULAR | Status: AC
  Filled 2013-08-30: qty 5

## 2013-08-30 MED ORDER — SODIUM CHLORIDE 0.9 % IR SOLN
Status: DC | PRN
  Administered 2013-08-30: 1000 mL

## 2013-08-30 MED ORDER — DIPHENHYDRAMINE HCL 12.5 MG/5ML PO ELIX: 12.5000 mg | ORAL_SOLUTION | ORAL | Status: DC | PRN

## 2013-08-30 MED ORDER — 0.9 % SODIUM CHLORIDE (POUR BTL) OPTIME
TOPICAL | Status: DC | PRN
  Administered 2013-08-30: 1000 mL

## 2013-08-30 MED ORDER — METOCLOPRAMIDE HCL 5 MG PO TABS
5.0000 mg | ORAL_TABLET | Freq: Three times a day (TID) | ORAL | Status: DC | PRN
  Filled 2013-08-30: qty 2

## 2013-08-30 MED ORDER — PHENYLEPHRINE HCL 10 MG/ML IJ SOLN
INTRAMUSCULAR | Status: DC | PRN
  Administered 2013-08-30 (×3): 40 ug via INTRAVENOUS
  Administered 2013-08-30: 80 ug via INTRAVENOUS

## 2013-08-30 MED ORDER — GLYCOPYRROLATE 0.2 MG/ML IJ SOLN
INTRAMUSCULAR | Status: AC
  Filled 2013-08-30: qty 6

## 2013-08-30 MED ORDER — RIVAROXABAN 10 MG PO TABS
10.0000 mg | ORAL_TABLET | Freq: Every day | ORAL | Status: DC
  Administered 2013-08-31: 10 mg via ORAL
  Filled 2013-08-30 (×2): qty 1

## 2013-08-30 MED ORDER — NEOSTIGMINE METHYLSULFATE 10 MG/10ML IV SOLN
INTRAVENOUS | Status: AC
  Filled 2013-08-30: qty 1

## 2013-08-30 MED ORDER — SUCCINYLCHOLINE CHLORIDE 20 MG/ML IJ SOLN
INTRAMUSCULAR | Status: DC | PRN
  Administered 2013-08-30: 100 mg via INTRAVENOUS

## 2013-08-30 MED ORDER — STERILE WATER FOR INJECTION IJ SOLN
INTRAMUSCULAR | Status: AC
  Filled 2013-08-30: qty 10

## 2013-08-30 MED ORDER — METOCLOPRAMIDE HCL 5 MG/ML IJ SOLN: 5.0000 mg | Freq: Three times a day (TID) | INTRAMUSCULAR | Status: DC | PRN

## 2013-08-30 MED ORDER — LIDOCAINE HCL (CARDIAC) 20 MG/ML IV SOLN
INTRAVENOUS | Status: DC | PRN
  Administered 2013-08-30: 80 mg via INTRAVENOUS

## 2013-08-30 MED ORDER — ACETAMINOPHEN 650 MG RE SUPP: 650.0000 mg | Freq: Four times a day (QID) | RECTAL | Status: DC | PRN

## 2013-08-30 MED ORDER — GLYCOPYRROLATE 0.2 MG/ML IJ SOLN
INTRAMUSCULAR | Status: DC | PRN
  Administered 2013-08-30 (×2): 0.4 mg via INTRAVENOUS

## 2013-08-30 MED ORDER — POTASSIUM CHLORIDE IN NACL 20-0.45 MEQ/L-% IV SOLN
INTRAVENOUS | Status: DC
  Administered 2013-08-30 – 2013-08-31 (×2): via INTRAVENOUS
  Filled 2013-08-30 (×3): qty 1000

## 2013-08-30 MED ORDER — SENNA-DOCUSATE SODIUM 8.6-50 MG PO TABS: 2.0000 | ORAL_TABLET | Freq: Every day | ORAL | Status: DC

## 2013-08-30 MED ORDER — RIVAROXABAN 10 MG PO TABS: 10.0000 mg | ORAL_TABLET | Freq: Every day | ORAL | Status: DC

## 2013-08-30 MED ORDER — ROCURONIUM BROMIDE 100 MG/10ML IV SOLN
INTRAVENOUS | Status: DC | PRN
  Administered 2013-08-30: 30 mg via INTRAVENOUS

## 2013-08-30 MED ORDER — LORATADINE 10 MG PO TABS
10.0000 mg | ORAL_TABLET | Freq: Every day | ORAL | Status: DC
  Administered 2013-08-31: 10 mg via ORAL
  Filled 2013-08-30 (×2): qty 1

## 2013-08-30 MED ORDER — PROMETHAZINE HCL 25 MG PO TABS: 25.0000 mg | ORAL_TABLET | Freq: Four times a day (QID) | ORAL | Status: DC | PRN

## 2013-08-30 MED ORDER — ONDANSETRON HCL 4 MG/2ML IJ SOLN
4.0000 mg | Freq: Four times a day (QID) | INTRAMUSCULAR | Status: DC | PRN
  Administered 2013-08-30: 4 mg via INTRAVENOUS

## 2013-08-30 MED ORDER — SENNA 8.6 MG PO TABS
1.0000 | ORAL_TABLET | Freq: Two times a day (BID) | ORAL | Status: DC
  Administered 2013-08-30 – 2013-08-31 (×2): 8.6 mg via ORAL
  Filled 2013-08-30 (×3): qty 1

## 2013-08-30 MED ORDER — HYDROMORPHONE HCL PF 1 MG/ML IJ SOLN
1.0000 mg | INTRAMUSCULAR | Status: DC | PRN
  Administered 2013-08-30: 1 mg via INTRAVENOUS

## 2013-08-30 MED ORDER — DEXAMETHASONE SODIUM PHOSPHATE 10 MG/ML IJ SOLN
10.0000 mg | Freq: Three times a day (TID) | INTRAMUSCULAR | Status: AC
  Administered 2013-08-30: 10 mg via INTRAVENOUS
  Filled 2013-08-30 (×3): qty 1

## 2013-08-30 MED ORDER — DEXAMETHASONE 6 MG PO TABS
10.0000 mg | ORAL_TABLET | Freq: Three times a day (TID) | ORAL | Status: AC
  Administered 2013-08-30 – 2013-08-31 (×2): 10 mg via ORAL
  Filled 2013-08-30 (×3): qty 1

## 2013-08-30 MED ORDER — ONDANSETRON HCL 4 MG/2ML IJ SOLN
INTRAMUSCULAR | Status: DC | PRN
  Administered 2013-08-30: 4 mg via INTRAVENOUS

## 2013-08-30 MED ORDER — METHOCARBAMOL 500 MG PO TABS
500.0000 mg | ORAL_TABLET | Freq: Four times a day (QID) | ORAL | Status: DC | PRN
  Administered 2013-08-30 – 2013-08-31 (×2): 500 mg via ORAL
  Filled 2013-08-30 (×2): qty 1

## 2013-08-30 SURGICAL SUPPLY — 67 items
APL SKNCLS STERI-STRIP NONHPOA (GAUZE/BANDAGES/DRESSINGS) ×1
BANDAGE ELASTIC 6 VELCRO ST LF (GAUZE/BANDAGES/DRESSINGS) ×1 IMPLANT
BANDAGE ESMARK 6X9 LF (GAUZE/BANDAGES/DRESSINGS) ×1 IMPLANT
BANDAGE GAUZE ELAST BULKY 4 IN (GAUZE/BANDAGES/DRESSINGS) ×1 IMPLANT
BENZOIN TINCTURE PRP APPL 2/3 (GAUZE/BANDAGES/DRESSINGS) ×2 IMPLANT
BNDG CMPR 9X6 STRL LF SNTH (GAUZE/BANDAGES/DRESSINGS) ×1
BNDG CMPR MED 10X6 ELC LF (GAUZE/BANDAGES/DRESSINGS) ×1
BNDG ELASTIC 6X10 VLCR STRL LF (GAUZE/BANDAGES/DRESSINGS) ×1 IMPLANT
BNDG ESMARK 6X9 LF (GAUZE/BANDAGES/DRESSINGS) ×2
BOWL SMART MIX CTS (DISPOSABLE) ×2 IMPLANT
CAPT KNEE OXFORD ×1 IMPLANT
CEMENT HV SMART SET (Cement) ×2 IMPLANT
CLSR STERI-STRIP ANTIMIC 1/2X4 (GAUZE/BANDAGES/DRESSINGS) ×2 IMPLANT
COVER SURGICAL LIGHT HANDLE (MISCELLANEOUS) ×2 IMPLANT
CUFF TOURNIQUET SINGLE 34IN LL (TOURNIQUET CUFF) ×1 IMPLANT
CUFF TOURNIQUET SINGLE 44IN (TOURNIQUET CUFF) ×1 IMPLANT
DRAPE EXTREMITY T 121X128X90 (DRAPE) ×2 IMPLANT
DRAPE ORTHO SPLIT 77X108 STRL (DRAPES) ×2
DRAPE PROXIMA HALF (DRAPES) IMPLANT
DRAPE SURG ORHT 6 SPLT 77X108 (DRAPES) IMPLANT
DRAPE U-SHAPE 47X51 STRL (DRAPES) ×2 IMPLANT
DURAPREP 26ML APPLICATOR (WOUND CARE) ×2 IMPLANT
ELECT CAUTERY BLADE 6.4 (BLADE) ×2 IMPLANT
ELECT REM PT RETURN 9FT ADLT (ELECTROSURGICAL) ×2
ELECTRODE REM PT RTRN 9FT ADLT (ELECTROSURGICAL) ×1 IMPLANT
GLOVE BIOGEL PI IND STRL 7.5 (GLOVE) ×1 IMPLANT
GLOVE BIOGEL PI INDICATOR 7.5 (GLOVE) ×1
GLOVE BIOGEL PI ORTHO PRO SZ8 (GLOVE) ×2
GLOVE ORTHO TXT STRL SZ7.5 (GLOVE) ×3 IMPLANT
GLOVE PI ORTHO PRO STRL SZ8 (GLOVE) ×2 IMPLANT
GLOVE SURG ORTHO 8.0 STRL STRW (GLOVE) ×4 IMPLANT
GLOVE SURG SS PI 7.0 STRL IVOR (GLOVE) ×2 IMPLANT
GOWN STRL REUS W/ TWL LRG LVL3 (GOWN DISPOSABLE) ×1 IMPLANT
GOWN STRL REUS W/ TWL XL LVL3 (GOWN DISPOSABLE) ×1 IMPLANT
GOWN STRL REUS W/TWL 2XL LVL3 (GOWN DISPOSABLE) ×2 IMPLANT
GOWN STRL REUS W/TWL LRG LVL3 (GOWN DISPOSABLE) ×2
GOWN STRL REUS W/TWL XL LVL3 (GOWN DISPOSABLE) ×4
HANDPIECE INTERPULSE COAX TIP (DISPOSABLE) ×2
HOOD PEEL AWAY FACE SHEILD DIS (HOOD) ×4 IMPLANT
IMMOBILIZER KNEE 22 UNIV (SOFTGOODS) ×2 IMPLANT
KIT BASIN OR (CUSTOM PROCEDURE TRAY) ×2 IMPLANT
KIT ROOM TURNOVER OR (KITS) ×2 IMPLANT
MANIFOLD NEPTUNE II (INSTRUMENTS) ×2 IMPLANT
NDL HYPO 21X1.5 SAFETY (NEEDLE) IMPLANT
NEEDLE HYPO 21X1.5 SAFETY (NEEDLE) IMPLANT
NS IRRIG 1000ML POUR BTL (IV SOLUTION) ×2 IMPLANT
PACK TOTAL JOINT (CUSTOM PROCEDURE TRAY) ×2 IMPLANT
PAD ABD 8X10 STRL (GAUZE/BANDAGES/DRESSINGS) ×2 IMPLANT
PAD ARMBOARD 7.5X6 YLW CONV (MISCELLANEOUS) ×4 IMPLANT
PAD CAST 4YDX4 CTTN HI CHSV (CAST SUPPLIES) ×1 IMPLANT
PADDING CAST COTTON 4X4 STRL (CAST SUPPLIES) ×2
PADDING CAST COTTON 6X4 STRL (CAST SUPPLIES) ×2 IMPLANT
SAWBLADE OXFORD PARTIAL (BLADE) ×1 IMPLANT
SET HNDPC FAN SPRY TIP SCT (DISPOSABLE) ×1 IMPLANT
SPONGE GAUZE 4X4 12PLY (GAUZE/BANDAGES/DRESSINGS) ×2 IMPLANT
STRIP CLOSURE SKIN 1/2X4 (GAUZE/BANDAGES/DRESSINGS) ×1 IMPLANT
SUCTION FRAZIER TIP 10 FR DISP (SUCTIONS) ×2 IMPLANT
SUT MNCRL AB 4-0 PS2 18 (SUTURE) IMPLANT
SUT VIC AB 0 CT1 27 (SUTURE) ×2
SUT VIC AB 0 CT1 27XBRD ANBCTR (SUTURE) ×1 IMPLANT
SUT VIC AB 1 CT1 27 (SUTURE) ×2
SUT VIC AB 1 CT1 27XBRD ANBCTR (SUTURE) ×1 IMPLANT
SUT VIC AB 3-0 SH 8-18 (SUTURE) ×2 IMPLANT
SYR CONTROL 10ML LL (SYRINGE) IMPLANT
TOWEL OR 17X24 6PK STRL BLUE (TOWEL DISPOSABLE) ×2 IMPLANT
TOWEL OR 17X26 10 PK STRL BLUE (TOWEL DISPOSABLE) ×2 IMPLANT
WATER STERILE IRR 1000ML POUR (IV SOLUTION) ×1 IMPLANT

## 2013-08-30 NOTE — Anesthesia Postprocedure Evaluation (Signed)
  Anesthesia Post-op Note  Patient: Frances Medina  Procedure(s) Performed: Procedure(s): RIGHT UNICOMPARTMENTAL KNEE (Right)  Patient Location: PACU  Anesthesia Type:General  Level of Consciousness: awake  Airway and Oxygen Therapy: Patient Spontanous Breathing  Post-op Pain: mild  Post-op Assessment: Post-op Vital signs reviewed  Post-op Vital Signs: Reviewed  Last Vitals:  Filed Vitals:   08/30/13 1256  BP: 153/89  Pulse: 78  Temp:   Resp:     Complications: No apparent anesthesia complications

## 2013-08-30 NOTE — Transfer of Care (Signed)
Immediate Anesthesia Transfer of Care Note  Patient: Frances Medina  Procedure(s) Performed: Procedure(s): RIGHT UNICOMPARTMENTAL KNEE (Right)  Patient Location: PACU  Anesthesia Type:GA combined with regional for post-op pain  Level of Consciousness: awake and alert   Airway & Oxygen Therapy: Patient Spontanous Breathing and Patient connected to nasal cannula oxygen  Post-op Assessment: Report given to PACU RN and Post -op Vital signs reviewed and stable  Post vital signs: Reviewed and stable  Complications: No apparent anesthesia complications

## 2013-08-30 NOTE — Discharge Instructions (Signed)
Diet: As you were doing prior to hospitalization   Shower:  May shower but keep the wounds dry, use an occlusive plastic wrap, NO SOAKING IN TUB.  If the bandage gets wet, change with a clean dry gauze.  Dressing:  You may change your dressing 3-5 days after surgery.  Then change the dressing daily with sterile gauze dressing.    There are sticky tapes (steri-strips) on your wounds and all the stitches are absorbable.  Leave the steri-strips in place when changing your dressings, they will peel off with time, usually 2-3 weeks.  Activity:  Increase activity slowly as tolerated, but follow the weight bearing instructions below.  No lifting or driving for 6 weeks.  Weight Bearing:   As tolerated.    To prevent constipation: you may use a stool softener such as -  Colace (over the counter) 100 mg by mouth twice a day  Drink plenty of fluids (prune juice may be helpful) and high fiber foods Miralax (over the counter) for constipation as needed.    Itching:  If you experience itching with your medications, try taking only a single pain pill, or even half a pain pill at a time.  You may take up to 10 pain pills per day, and you can also use benadryl over the counter for itching or also to help with sleep.   Precautions:  If you experience chest pain or shortness of breath - call 911 immediately for transfer to the hospital emergency department!!  If you develop a fever greater that 101 F, purulent drainage from wound, increased redness or drainage from wound, or calf pain -- Call the office at [phone]                                                Follow- Up Appointment:  Please call for an appointment to be seen in 2 weeks [place] - [phone]     Information on my medicine - XARELTO (Rivaroxaban)  This medication education was reviewed with me or my healthcare representative as part of my discharge preparation.  The pharmacist that spoke with me during my hospital stay was:   Jaquita Folds, [facility]  Why was Xarelto prescribed for you? Xarelto was prescribed for you to reduce the risk of blood clots forming after orthopedic surgery. The medical term for these abnormal blood clots is venous thromboembolism (VTE).  What do you need to know about xarelto ? Take your Xarelto ONCE DAILY at the same time every day. You may take it either with or without food.  If you have difficulty swallowing the tablet whole, you may crush it and mix in applesauce just prior to taking your dose.  Take Xarelto exactly as prescribed by your doctor and DO NOT stop taking Xarelto without talking to the doctor who prescribed the medication.  Stopping without other VTE prevention medication to take the place of Xarelto may increase your risk of developing a clot.  After discharge, you should have regular check-up appointments with your healthcare provider that is prescribing your Xarelto.    What do you do if you miss a dose? If you miss a dose, take it as soon as you remember on the same day then continue your regularly scheduled once daily regimen the next day. Do not take two doses of Xarelto on the same day.  Important Safety Information A possible side effect of Xarelto is bleeding. You should call your healthcare provider right away if you experience any of the following:   Bleeding from an injury or your nose that does not stop.   Unusual colored urine (red or dark brown) or unusual colored stools (red or black).   Unusual bruising for unknown reasons.   A serious fall or if you hit your head (even if there is no bleeding).  Some medicines may interact with Xarelto and might increase your risk of bleeding while on Xarelto. To help avoid this, consult your healthcare provider or pharmacist prior to using any new prescription or non-prescription medications, including herbals, vitamins, non-steroidal anti-inflammatory drugs (NSAIDs) and supplements.  This website has more  information on Xarelto: [email or web address]

## 2013-08-30 NOTE — Evaluation (Signed)
Physical Therapy Evaluation Patient Details Name: Frances Medina MRN: [id number] DOB: 1960/12/13 Today's Date: 08/30/2013   History of Present Illness  pt presents with R Unicompartmental Knee replacement.    Clinical Impression  Pt moving well, but limited by nausea.  Feel as nausea resolves pt will make great progress to return home with family.  Will continue to follow.      Follow Up Recommendations Home health PT;Supervision/Assistance - 24 hour    Equipment Recommendations  None recommended by PT    Recommendations for Other Services       Precautions / Restrictions Precautions Precautions: Fall Required Braces or Orthoses: Knee Immobilizer - Right Knee Immobilizer - Right: On when out of bed or walking;Discontinue once straight leg raise with < 10 degree lag Restrictions Weight Bearing Restrictions: Yes RLE Weight Bearing: Weight bearing as tolerated      Mobility  Bed Mobility Overal bed mobility: Needs Assistance Bed Mobility: Supine to Sit     Supine to sit: Min assist;HOB elevated     General bed mobility comments: A with bringing R LE OOB.    Transfers Overall transfer level: Needs assistance Equipment used: Rolling walker (2 wheeled) Transfers: Sit to/from Stand Sit to Stand: Min guard         General transfer comment: cues for UE use and positioning LEs prior to sitting.    Ambulation/Gait Ambulation/Gait assistance: Min guard Ambulation Distance (Feet): 60 Feet Assistive device: Rolling walker (2 wheeled) Gait Pattern/deviations: Step-through pattern;Decreased step length - left;Decreased stance time - right;Decreased stride length     General Gait Details: cues for gait sequencing, upright posture, encouragement.    Stairs            Wheelchair Mobility    Modified Rankin (Stroke Patients Only)       Balance Overall balance assessment: Needs assistance Sitting-balance support: Single extremity supported;Feet  supported Sitting balance-Leahy Scale: Poor     Standing balance support: Bilateral upper extremity supported Standing balance-Leahy Scale: Poor                               Pertinent Vitals/Pain 4/10.  Premedicated.      Home Living Family/patient expects to be discharged to:: Private residence Living Arrangements: Spouse/significant other;Children Available Help at Discharge: Family;Available 24 hours/day Type of Home: House Home Access: Stairs to enter Entrance Stairs-Rails: None Entrance Stairs-Number of Steps: 4 Home Layout: Two level;1/2 bath on main level Home Equipment: Walker - 2 wheels;Bedside commode      Prior Function Level of Independence: Independent               Hand Dominance        Extremity/Trunk Assessment   Upper Extremity Assessment: Overall WFL for tasks assessed           Lower Extremity Assessment: RLE deficits/detail RLE Deficits / Details: Generally weak and painful post-op.      Cervical / Trunk Assessment: Normal  Communication   Communication: No difficulties  Cognition Arousal/Alertness: Awake/alert Behavior During Therapy: WFL for tasks assessed/performed Overall Cognitive Status: Within Functional Limits for tasks assessed                      General Comments      Exercises        Assessment/Plan    PT Assessment Patient needs continued PT services  PT Diagnosis Abnormality of gait;Acute pain  PT Problem List Decreased strength;Decreased range of motion;Decreased activity tolerance;Decreased balance;Decreased mobility;Decreased knowledge of use of DME;Pain  PT Treatment Interventions DME instruction;Gait training;Stair training;Functional mobility training;Therapeutic activities;Therapeutic exercise;Balance training;Patient/family education   PT Goals (Current goals can be found in the Care Plan section) Acute Rehab PT Goals Patient Stated Goal: Home tomorrow PT Goal Formulation: With  patient Time For Goal Achievement: 09/06/13 Potential to Achieve Goals: Good    Frequency 7X/week   Barriers to discharge        Co-evaluation               End of Session Equipment Utilized During Treatment: Gait belt;Right knee immobilizer Activity Tolerance: Patient tolerated treatment well Patient left: in chair;with call bell/phone within reach;with family/visitor present Nurse Communication: Mobility status         Time: 0867-6195 PT Time Calculation (min): 26 min   Charges:   PT Evaluation $Initial PT Evaluation Tier I: 1 Procedure PT Treatments $Gait Training: 8-22 mins   PT G CodesCatarina Hartshorn, Rudolph 08/30/2013, 3:18 PM

## 2013-08-30 NOTE — Anesthesia Procedure Notes (Addendum)
Procedure Name: Intubation Date/Time: 08/30/2013 7:41 AM Performed by: Ollen Bowl Pre-anesthesia Checklist: Patient identified, Emergency Drugs available, Suction available, Patient being monitored and Timeout performed Patient Re-evaluated:Patient Re-evaluated prior to inductionOxygen Delivery Method: Circle system utilized and Simple face mask Preoxygenation: Pre-oxygenation with 100% oxygen Intubation Type: IV induction Ventilation: Mask ventilation without difficulty Laryngoscope Size: Mac and 4 Grade View: Grade I Tube type: Oral Tube size: 7.5 mm Number of attempts: 1 Airway Equipment and Method: Patient positioned with wedge pillow and Stylet Placement Confirmation: ETT inserted through vocal cords under direct vision,  positive ETCO2 and breath sounds checked- equal and bilateral Secured at: 21 cm Tube secured with: Tape Dental Injury: Teeth and Oropharynx as per pre-operative assessment     Anesthesia Regional Block:  Femoral nerve block  Pre-Anesthetic Checklist: ,, timeout performed, Correct Patient, Correct Site, Correct Laterality, Correct Procedure, Correct Position, site marked, Risks and benefits discussed, Surgical consent,  Pre-op evaluation,  Post-op pain management  Laterality: Right  Prep: chloraprep       Needles:   Needle Type: Stimulator Needle - 80          Additional Needles:  Procedures: Doppler guided and nerve stimulator Femoral nerve block  Nerve Stimulator or Paresthesia:  Response: 0.5 mA,   Additional Responses:   Narrative:  Start time: 08/30/2013 6:55 AM End time: 08/30/2013 7:10 AM Injection made incrementally with aspirations every 5 mL.  Performed by: Personally  Anesthesiologist: Dr. Oletta Lamas

## 2013-08-30 NOTE — Anesthesia Preprocedure Evaluation (Addendum)
Anesthesia Evaluation  Patient identified by MRN, date of birth, ID band Patient awake    Reviewed: Allergy & Precautions, H&P , NPO status , Patient's Chart, lab work & pertinent test results, reviewed documented beta blocker date and time   Airway Mallampati: II TM Distance: <3 FB     Dental  (+) Dental Advisory Given   Pulmonary former smoker,          Cardiovascular hypertension, Pt. on medications     Neuro/Psych  Headaches,    GI/Hepatic GERD-  ,  Endo/Other    Renal/GU      Musculoskeletal   Abdominal (+) + obese,   Peds  Hematology   Anesthesia Other Findings   Reproductive/Obstetrics                         Anesthesia Physical Anesthesia Plan  ASA: III  Anesthesia Plan: General   Post-op Pain Management:    Induction: Intravenous  Airway Management Planned: Oral ETT  Additional Equipment:   Intra-op Plan:   Post-operative Plan: Possible Post-op intubation/ventilation  Informed Consent: I have reviewed the patients History and Physical, chart, labs and discussed the procedure including the risks, benefits and alternatives for the proposed anesthesia with the patient or authorized representative who has indicated his/her understanding and acceptance.   Dental advisory given  Plan Discussed with: CRNA and Anesthesiologist  Anesthesia Plan Comments:         Anesthesia Quick Evaluation

## 2013-08-30 NOTE — Discharge Summary (Signed)
Physician Discharge Summary  Patient ID: Frances Medina MRN: [id number] DOB/AGE: Feb 27, 1961 53 y.o.  Admit date: 08/30/2013 Discharge date: 08/31/2013  Admission Diagnoses:  Osteoarthritis of right knee  Discharge Diagnoses:  Principal Problem:   Osteoarthritis of right knee Active Problems:   Knee osteoarthritis   Past Medical History  Diagnosis Date  . GERD (gastroesophageal reflux disease)   . 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 Hypertension   . Knee injury   . Depression   . Arthritis   . Frequent headaches   . Anxiety   . Seasonal allergies     takes Zytrec  . Fibroid uterus     size of a dime  . Umbilical hernia     watching , no plans for surgery at present  . Osteoarthritis of right knee 08/30/2013    Surgeries: Procedure(s): RIGHT UNICOMPARTMENTAL KNEE on 08/30/2013   Consultants (if any):    Discharged Condition: Improved  Hospital Course: Frances Medina is an 53 y.o. female who was admitted 08/30/2013 with a diagnosis of Osteoarthritis of right knee and went to the operating room on 08/30/2013 and underwent the above named procedures.    She was given perioperative antibiotics:  Anti-infectives   Start     Dose/Rate Route Frequency Ordered Stop   08/30/13 1300  ceFAZolin (ANCEF) 3 g in dextrose 5 % 50 mL IVPB     3 g 160 mL/hr over 30 Minutes Intravenous Every 6 hours 08/30/13 1127 08/31/13 0059   08/30/13 0548  ceFAZolin (ANCEF) 3 g in dextrose 5 % 50 mL IVPB     3 g 160 mL/hr over 30 Minutes Intravenous On call to O.R. 08/30/13 9937 08/30/13 0755    .  She was given sequential compression devices, early ambulation, and xarelto for DVT prophylaxis.  She benefited maximally from the hospital stay and there were no complications.    Recent vital signs:  Filed Vitals:   08/30/13 1256  BP: 153/89  Pulse: 78  Temp:   Resp:     Recent laboratory studies:  Lab Results  Component Value Date   HGB 13.0 08/19/2013   HGB 14.3 01/14/2011   HGB 13.1  01/14/2011   Lab Results  Component Value Date   WBC 7.7 08/19/2013   PLT 155 08/19/2013   Lab Results  Component Value Date   INR 0.90 08/19/2013   Lab Results  Component Value Date   NA 142 08/19/2013   K 4.0 08/19/2013   CL 102 08/19/2013   CO2 28 08/19/2013   BUN 14 08/19/2013   CREATININE 0.67 08/19/2013   GLUCOSE 86 08/19/2013    Discharge Medications:     Medication List    STOP taking these medications       diclofenac 75 MG EC tablet  Commonly known as:  VOLTAREN      TAKE these medications       cetirizine 10 MG tablet  Commonly known as:  ZYRTEC  Take 10 mg by mouth daily.     clonazePAM 1 MG tablet  Commonly known as:  KLONOPIN  Take 0.5 tablets (0.5 mg total) by mouth daily as needed for anxiety.     methocarbamol 500 MG tablet  Commonly known as:  ROBAXIN  Take 1 tablet (500 mg total) by mouth 4 (four) times daily.     MOVIPREP 100 G Solr  Generic drug:  peg 3350 powder  MoviPrep (no substitutions)-TAKE AS DIRECTED.  oxyCODONE-acetaminophen 10-325 MG per tablet  Commonly known as:  PERCOCET  Take 1-2 tablets by mouth every 6 (six) hours as needed for pain. MAXIMUM TOTAL ACETAMINOPHEN DOSE IS 4000 MG PER DAY     phentermine 37.5 MG capsule  Take 37.5 mg by mouth daily.     promethazine 25 MG tablet  Commonly known as:  PHENERGAN  Take 1 tablet (25 mg total) by mouth every 6 (six) hours as needed for nausea or vomiting.     rivaroxaban 10 MG Tabs tablet  Commonly known as:  XARELTO  Take 1 tablet (10 mg total) by mouth daily.     sennosides-docusate sodium 8.6-50 MG tablet  Commonly known as:  SENOKOT-S  Take 2 tablets by mouth daily.     sertraline 100 MG tablet  Commonly known as:  ZOLOFT  Take 100 mg by mouth daily.        Diagnostic Studies: Dg Chest 2 View  08/19/2013   CLINICAL DATA:  Smoking history.  Right knee replacement.  EXAM: CHEST  2 VIEW  COMPARISON:  None.  FINDINGS: Mediastinum and hilar structures are normal. Low lung  volumes. Lungs are clear of infiltrates. No pleural effusion or pneumothorax. Heart size and pulmonary vascularity normal. No acute bony abnormality.  IMPRESSION: No active cardiopulmonary disease.   Electronically Signed   By: Frances Medina  Register   On: 08/19/2013 10:08   Dg Knee Right Port  08/30/2013   CLINICAL DATA:  Status post arthroplasty  EXAM: PORTABLE RIGHT KNEE - 1-2 VIEW  COMPARISON:  None.  FINDINGS: The right knee demonstrates a medial femorotibial compartment arthroplasty without evidence of hardware failure complication. There are marginal osteophytes involving the lateral femoral tibial compartment. There is a large joint effusion with air within the fusion. There is no fracture or dislocation. The alignment is anatomic. Post-surgical changes noted in the surrounding soft tissues.  IMPRESSION: Right medial femorotibial compartment arthroplasty. Large joint effusion.   Electronically Signed   By: Frances Medina   On: 08/30/2013 09:56    Disposition: 01-Home or Self Care        Follow-up Information   Follow up with Johnny Bridge, MD. Schedule an appointment as soon as possible for a visit in 2 weeks.   Specialty:  Orthopedic Surgery   Contact information:   [place] [id number] [phone]        Signed: Johnny Bridge 08/30/2013, 2:44 PM

## 2013-08-30 NOTE — Op Note (Signed)
08/30/2013  9:16 AM  PATIENT:  Frances Medina    PRE-OPERATIVE DIAGNOSIS:  right knee djd  POST-OPERATIVE DIAGNOSIS:  Same  PROCEDURE:  RIGHT UNICOMPARTMENTAL KNEE  SURGEON:  Johnny Bridge, MD  PHYSICIAN ASSISTANT: Joya Gaskins, OPA-C, present and scrubbed throughout the case, critical for completion in a timely fashion, and for retraction, instrumentation, and closure.  ANESTHESIA:   General  PREOPERATIVE INDICATIONS:  Frances Medina is a  53 y.o. female with a diagnosis of right knee djd who failed conservative measures and elected for surgical management.    The risks benefits and alternatives were discussed with the patient preoperatively including but not limited to the risks of infection, bleeding, nerve injury, cardiopulmonary complications, blood clots, the need for revision surgery, among others, and the patient was willing to proceed.  OPERATIVE IMPLANTS: Biomet [place] mobile bearing medial compartment arthroplasty femur size small, tibia size A, bearing size 4.  OPERATIVE FINDINGS: Endstage grade 4 medial compartment osteoarthritis. No significant changes in the lateral or patellofemoral joint.  The ACL was intact. She did have a small amount of damage in the medial femoral condyle on the femoral trochlea, and a very tiny defect on the lateral articular surface of the femur, but no full-thickness chondral loss.  OPERATIVE PROCEDURE: The patient was brought to the operating room placed in supine position. General anesthesia was administered. IV antibiotics were given. The lower extremity was placed in the legholder and prepped and draped in usual sterile fashion.  Time out was performed.  The leg was elevated and exsanguinated and the tourniquet was inflated. Anteromedial incision was performed, and I took care to preserve the MCL. Parapatellar incision was carried out, and the osteophytes were excised, along with the medial meniscus and a small portion of the fat  pad.  The extra medullary tibial cutting jig was applied, using the spoon and the 48mm G-Clamp, and I took care to protect the anterior cruciate ligament insertion and the tibial spine. The medial collateral ligament was also protected, and I resected my proximal tibia, matching the anatomic slope.   The proximal tibial bony cut was removed in one piece, and I turned my attention to the femur.  The intramedullary femoral rod was placed using the drill, and then using the appropriate reference, I assembled the femoral jig, setting my posterior cutting block. I resected my posterior femur, used the 0 spigot for the anterior femur, and then measured my gap.   I then used the appropriate mill to match the extension gap to the flexion gap. The gaps were then measured again with the appropriate feeler gauges. Once I had balanced flexion and extension gaps, I then completed the preparation of the femur.  I milled off the anterior aspect of the distal femur to prevent impingement. I also exposed the tibia, and selected the above-named component, and then used the cutting jig to prepare the keel slot on the tibia. I also used the awl to curette out the bone to complete the preparation of the keel. The back wall was intact.  I then placed trial components, and it was found to have excellent motion, and appropriate balance.  I then cemented the components into place, cementing the tibia first, removing all excess cement, and then cementing the femur.  All loose cement was removed.  The real polyethylene insert was applied manually, and the knee was taken through functional range of motion, and found to have excellent stability and restoration of joint motion, with excellent balance.  The wounds were irrigated copiously, and the parapatellar tissue closed with Vicryl, followed by Vicryl for the subcutaneous tissue, with routine closure with Steri-Strips and sterile gauze.  The tourniquet was released, and the  patient was awakened and extubated and returned to PACU in stable and satisfactory condition. There were no complications.

## 2013-08-30 NOTE — Progress Notes (Signed)
Called Dr.Edwards for sign out  

## 2013-08-30 NOTE — H&P (Signed)
PREOPERATIVE H&P  Chief Complaint: right knee djd  HPI: Frances Medina is a 53 y.o. female who presents for preoperative history and physical with a diagnosis of right knee djd. Symptoms are rated as moderate to severe, and have been worsening.  This is significantly impairing activities of daily living.  She has elected for surgical management. Failed injections, NSAIDs, activity modification.  Past Medical History  Diagnosis Date  . GERD (gastroesophageal reflux disease)   . 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 Hypertension   . Knee injury   . Depression   . Arthritis   . Frequent headaches   . Anxiety   . Seasonal allergies     takes Zytrec  . Fibroid uterus     size of a dime  . Umbilical hernia     watching , no plans for surgery at present   Past Surgical History  Procedure Laterality Date  . Cesarean section      x2  . Oophorectomy    . Dilation and curettage of uterus     History   Social History  . Marital Status: Married    Spouse Name: N/A    Number of Children: N/A  . Years of Education: N/A   Social History Main Topics  . Smoking status: Former Smoker -- 0.50 packs/day for 40 years    Quit date: 05/14/2012  . Smokeless tobacco: Never Used     Comment: quit in 2014, smoked for 40 years  . Alcohol Use: No  . Drug Use: No  . Sexual Activity: None   Other Topics Concern  . None   Social History Narrative   Work or School: [employer] Situation: lives with husband and daughters      Spiritual Beliefs: Christian      Lifestyle: no regular exercise; trying to eat healthy            Family History  Problem Relation Age of Onset  . Heart disease Mother 27  . Hypertension Mother   . Stroke Father   . Hypertension Father   . Colon cancer Neg Hx    Allergies  Allergen Reactions  . Dicyclomine Hives  . Penicillins Hives   Prior to Admission medications   Medication Sig Start Date End Date Taking? Authorizing  Provider  cetirizine (ZYRTEC) 10 MG tablet Take 10 mg by mouth daily.   Yes Historical Provider, MD  clonazePAM (KLONOPIN) 1 MG tablet Take 0.5 tablets (0.5 mg total) by mouth daily as needed for anxiety. 06/30/13  Yes Carvel Getting, NP  diclofenac (VOLTAREN) 75 MG EC tablet Take 75 mg by mouth 2 (two) times daily.   Yes Historical Provider, MD  MOVIPREP [place] (no substitutions)-TAKE AS DIRECTED. 08/17/13  Yes Ladene Artist, MD  phentermine 37.5 MG capsule Take 37.5 mg by mouth daily.   Yes Historical Provider, MD  sertraline (ZOLOFT) 100 MG tablet Take 100 mg by mouth daily.     Yes Historical Provider, MD     Positive ROS: All other systems have been reviewed and were otherwise negative with the exception of those mentioned in the HPI and as above.  Physical Exam: General: Alert, no acute distress Cardiovascular: No pedal edema Respiratory: No cyanosis, no use of accessory musculature GI: No organomegaly, abdomen is soft and non-tender Skin: No lesions in the area of chief complaint Neurologic: Sensation intact distally Psychiatric: Patient is  competent for consent with normal mood and affect Lymphatic: No axillary or cervical lymphadenopathy  MUSCULOSKELETAL: right knee ROM 0-100, positive varus and crepitance.  Assessment: right knee djd  Plan: Plan for Procedure(s): RIGHT UNICOMPARTMENTAL KNEE  The risks benefits and alternatives were discussed with the patient including but not limited to the risks of nonoperative treatment, versus surgical intervention including infection, bleeding, nerve injury,  blood clots, cardiopulmonary complications, morbidity, mortality, among others, and they were willing to proceed.   Johnny Bridge, MD Cell [phone]   08/30/2013 7:22 AM

## 2013-08-30 NOTE — Plan of Care (Signed)
Problem: Consults Goal: Diagnosis- Total Joint Replacement Partial/Uniknee. R Unicompartmental knee replacement

## 2013-08-31 ENCOUNTER — Encounter (HOSPITAL_COMMUNITY): Payer: Self-pay | Admitting: Orthopedic Surgery

## 2013-08-31 LAB — CBC
HCT: 36.3 % (ref 36.0–46.0)
Hemoglobin: 11.3 g/dL — ABNORMAL LOW (ref 12.0–15.0)
MCH: 24.1 pg — ABNORMAL LOW (ref 26.0–34.0)
MCHC: 31.1 g/dL (ref 30.0–36.0)
MCV: 77.4 fL — ABNORMAL LOW (ref 78.0–100.0)
Platelets: 166 10*3/uL (ref 150–400)
RBC: 4.69 MIL/uL (ref 3.87–5.11)
RDW: 16.9 % — ABNORMAL HIGH (ref 11.5–15.5)
WBC: 12.7 10*3/uL — ABNORMAL HIGH (ref 4.0–10.5)

## 2013-08-31 LAB — BASIC METABOLIC PANEL
BUN: 14 mg/dL (ref 6–23)
CO2: 25 mEq/L (ref 19–32)
Calcium: 9.3 mg/dL (ref 8.4–10.5)
Chloride: 100 mEq/L (ref 96–112)
Creatinine, Ser: 0.59 mg/dL (ref 0.50–1.10)
GFR calc Af Amer: >90 mL/min (ref >90)
GFR calc non Af Amer: >90 mL/min (ref >90)
Glucose, Bld: 157 mg/dL — ABNORMAL HIGH (ref 70–99)
Potassium: 5 mEq/L (ref 3.7–5.3)
Sodium: 138 mEq/L (ref 137–147)

## 2013-08-31 LAB — TYPE AND SCREEN
ABO/RH(D): A POS
Antibody Screen: POSITIVE

## 2013-08-31 MED ORDER — KETOROLAC TROMETHAMINE 15 MG/ML IJ SOLN
INTRAMUSCULAR | Status: AC
  Filled 2013-08-31: qty 1

## 2013-08-31 NOTE — Evaluation (Signed)
Agree with note. [place] 08/31/2013 Nestor Lewandowsky, OTR/L Pager: [phone]

## 2013-08-31 NOTE — Care Management Note (Signed)
CARE MANAGEMENT NOTE 08/31/2013  Patient:  Frances Medina, Frances Medina   Account Number:  [id number]  Date Initiated:  08/31/2013  Documentation initiated by:  Ricki Miller  Subjective/Objective Assessment:   53 yr old female s/p right unicompartmental knee replacement.     Action/Plan:   Case manager spoke with patient concerning home health and DME needs at discharge. Choice offered. Referral called to [facility], [place].   Anticipated DC Date:  08/31/2013   Anticipated DC Plan:  [place]  CM consult      [facility] Choice  HOME HEALTH  DURABLE MEDICAL EQUIPMENT   Choice offered to / List presented to:  C-1 Patient   DME arranged  CPM      DME agency  [employer]     HH arranged  HH-2 PT      [place].   Status of service:  Completed, signed off Medicare Important Message given?   (If response is "NO", the following Medicare IM given date fields will be blank) Date Medicare IM given:   Medicare IM given by:   Date Additional Medicare IM given:   Additional Medicare IM given by:    Discharge Disposition:  [place]  Per UR Regulation:  Reviewed for med. necessity/level of care/duration of stay

## 2013-08-31 NOTE — Progress Notes (Signed)
D/C instructions and scripts given. Pt verbalized understanding of home care. Pts husband/family at bedside to take pt home at this time

## 2013-08-31 NOTE — Progress Notes (Signed)
Physical Therapy Treatment Patient Details Name: GENELLE ECONOMOU MRN: [id number] DOB: 08-04-1960 Today's Date: 08/31/2013    History of Present Illness pt presents with R Unicompartmental Knee replacement.      PT Comments    Pt moving great this am.  Pt able to perform stairs and amb hallways with S.  Pt ready for D/C from PT stand point.  Will continue to follow while on acute.    Follow Up Recommendations  Home health PT;Supervision/Assistance - 24 hour     Equipment Recommendations  None recommended by PT    Recommendations for Other Services       Precautions / Restrictions Precautions Precautions: Fall Required Braces or Orthoses: Knee Immobilizer - Right Knee Immobilizer - Right: On when out of bed or walking;Discontinue once straight leg raise with < 10 degree lag Restrictions Weight Bearing Restrictions: Yes RLE Weight Bearing: Weight bearing as tolerated    Mobility  Bed Mobility Overal bed mobility: Needs Assistance Bed Mobility: Supine to Sit     Supine to sit: Supervision;HOB elevated        Transfers Overall transfer level: Needs assistance Equipment used: Rolling walker (2 wheeled) Transfers: Sit to/from Stand Sit to Stand: Supervision         General transfer comment: No physicalA needed.    Ambulation/Gait Ambulation/Gait assistance: Supervision Ambulation Distance (Feet): 300 Feet Assistive device: Rolling walker (2 wheeled) Gait Pattern/deviations: Decreased step length - left;Decreased stance time - right     General Gait Details: pt able to increase amb distance today without c/o nausea.  pt able to decrease reliance on RW for support.     Stairs Stairs: Yes Stairs assistance: Min assist Stair Management: No rails;Forwards;Step to pattern Number of Stairs: 2 General stair comments: MinG to ascend stairs without rail, but MinA to descend.  pt demos good awareness of safety.    Wheelchair Mobility    Modified Rankin (Stroke  Patients Only)       Balance                                    Cognition Arousal/Alertness: Awake/alert Behavior During Therapy: WFL for tasks assessed/performed Overall Cognitive Status: Within Functional Limits for tasks assessed                      Exercises Total Joint Exercises Long Arc Quad: AROM;Right;10 reps Knee Flexion: AROM;Right;10 reps Goniometric ROM: ~ 5 - 70    General Comments        Pertinent Vitals/Pain "Only twinges when I straighten it."    Home Living                      Prior Function            PT Goals (current goals can now be found in the care plan section) Acute Rehab PT Goals Time For Goal Achievement: 09/06/13 Potential to Achieve Goals: Good Progress towards PT goals: Progressing toward goals    Frequency  7X/week    PT Plan Current plan remains appropriate    Co-evaluation             End of Session Equipment Utilized During Treatment: Gait belt;Right knee immobilizer Activity Tolerance: Patient tolerated treatment well Patient left: in chair;with call bell/phone within reach     Time: 0741-0806 PT Time Calculation (min): 25 min  Charges:  $Gait  Training: 23-37 mins                    G CodesCatarina Hartshorn, [place] 08/31/2013, 8:11 AM

## 2013-08-31 NOTE — Evaluation (Signed)
Occupational Therapy Evaluation and Discharge Patient Details Name: Frances Medina MRN: [id number] DOB: 01-12-1961 Today's Date: 08/31/2013    History of Present Illness pt presents with R Unicompartmental Knee replacement.     Clinical Impression   PTA, pt required assistance for LB ADLs and now from above presents with limited ROM interfering with her independence with self care tasks. Educated pt on LB dressing/bathing sequence and technique and KI wearing schedule. Educated pt on available AE to assist with LB dressing but pt declined saying she had tons of family support at home that would continue to assist her with those tasks. Educated pt on where she could purchase a long handled sponge if deciding she wanted one. Pt will have intermittent assistance at home and has no concerns regarding ADLs, therefore no further OT is needed. We will sign off.    Follow Up Recommendations  No OT follow up    Equipment Recommendations  None recommended by OT       Precautions / Restrictions Precautions Precautions: Fall Required Braces or Orthoses: Knee Immobilizer - Right Knee Immobilizer - Right: On when out of bed or walking;Discontinue once straight leg raise with < 10 degree lag Restrictions Weight Bearing Restrictions: Yes RLE Weight Bearing: Weight bearing as tolerated      Mobility Bed Mobility   General bed mobility comments: Pt up in chair upon OT tx.  Transfers Overall transfer level: Needs assistance Equipment used: Rolling walker (2 wheeled) Transfers: Sit to/from Stand Sit to Stand: Supervision         General transfer comment: Good hand placement with no physical A needed.      Balance Overall balance assessment: Needs assistance   Sitting balance-Leahy Scale: Good     Standing balance support: Single extremity supported Standing balance-Leahy Scale: Fair                              ADL Overall ADL's : Needs  assistance/impaired Eating/Feeding: Independent;Sitting   Grooming: Sitting;Set up   Upper Body Bathing: Set up;Sitting   Lower Body Bathing: Sit to/from stand;Moderate assistance   Upper Body Dressing : Set up;Sitting   Lower Body Dressing: Moderate assistance;Sit to/from stand   Toilet Transfer: BSC;RW;Ambulation;Supervision/safety   Toileting- Water quality scientist and Hygiene: Moderate assistance;Sit to/from stand       Functional mobility during ADLs: Supervision/safety;Rolling walker General ADL Comments: Educated pt on LB dressing/bathing technique and sequence and KI wearing schedule. Educated the pt on AE to assist with LB dressing but pt declined saying she had family at home that helped her with that previously and would continue to help with those tasks. Pt stated that she would sit on the side of the tub for LB bathing and was informed of where she could get a long handled sponge if she wanted one to help with that. Pt stated that she has tons of family support at home and had no concerns about ADLs.                Pertinent Vitals/Pain No c/o pain.        Extremity/Trunk Assessment Upper Extremity Assessment Upper Extremity Assessment: Overall WFL for tasks assessed   Lower Extremity Assessment Lower Extremity Assessment: Defer to PT evaluation       Communication Communication Communication: No difficulties   Cognition Arousal/Alertness: Awake/alert Behavior During Therapy: WFL for tasks assessed/performed Overall Cognitive Status: Within Functional Limits for tasks assessed  Home Living Family/patient expects to be discharged to:: Private residence Living Arrangements: Spouse/significant other;Children Available Help at Discharge: Family;Available 24 hours/day Type of Home: House Home Access: Stairs to enter [employer] of Steps: 4 Entrance Stairs-Rails: None Home Layout: Two level;1/2 bath  on main level Alternate Level Stairs-Number of Steps: flight Alternate Level Stairs-Rails: Right Bathroom Shower/Tub: Tub/shower unit Shower/tub characteristics: Curtain Biochemist, clinical: Standard     Home Equipment: Environmental consultant - 2 wheels;Bedside commode          Prior Functioning/Environment Level of Independence: Needs assistance    ADL's / Homemaking Assistance Needed: A for LB dressing                 OT Goals(Current goals can be found in the care plan section) Acute Rehab OT Goals Patient Stated Goal: home today   End of Session Equipment Utilized During Treatment: Rolling walker;Right knee immobilizer Nurse Communication:  (pt is ready to bathe and is good from OT standpoint for d/c)  Activity Tolerance: Patient tolerated treatment well Patient left: in chair;with call bell/phone within reach   Time:  -    Charges:    G-CodesLyda Perone Sep 02, 2013, 10:38 AM

## 2013-08-31 NOTE — Progress Notes (Signed)
Patient ID: [name], female   DOB: [birth year], 53 y.o.   MRN: [id number]     Subjective:  Patient reports pain as mild.  Patient has been up with PT and doing well  Objective:   VITALS:   Filed Vitals:   08/31/13 0035 08/31/13 0330 08/31/13 0431 08/31/13 0800  BP: 137/66  134/68   Pulse: 111  81   Temp: 97.9 F (36.6 C)  97.9 F (36.6 C)   TempSrc: Oral  Oral   Resp: 16 16 16 18   SpO2: 93% 93% 95%     ABD soft Sensation intact distally Dorsiflexion/Plantar flexion intact Incision: dressing C/D/I and no drainage   Lab Results  Component Value Date   WBC 12.7* 08/31/2013   HGB 11.3* 08/31/2013   HCT 36.3 08/31/2013   MCV 77.4* 08/31/2013   PLT 166 08/31/2013     Assessment/Plan: 1 Day Post-Op   Principal Problem:   Osteoarthritis of right knee Active Problems:   Knee osteoarthritis   Advance diet Up with therapy DC home per Dr Luanna Cole orders WBAT Dry dressing PRN   Remonia Richter 08/31/2013, 8:46 AM   Marchia Bond, MD Cell [phone]

## 2013-09-01 LAB — TYPE AND SCREEN
ABO/RH(D): A POS
Antibody Screen: POSITIVE
DAT, IgG: POSITIVE
Unit division: 0
Unit division: 0

## 2013-09-13 ENCOUNTER — Encounter (HOSPITAL_COMMUNITY): Payer: Self-pay | Admitting: *Deleted

## 2013-09-13 ENCOUNTER — Telehealth: Payer: Self-pay | Admitting: Gastroenterology

## 2013-09-13 ENCOUNTER — Encounter (HOSPITAL_COMMUNITY): Payer: Self-pay | Admitting: Pharmacy Technician

## 2013-09-13 DIAGNOSIS — Z1211 Encounter for screening for malignant neoplasm of colon: Secondary | ICD-10-CM

## 2013-09-13 NOTE — Telephone Encounter (Signed)
Patient is scheduled for colon [facility] 09/19/13 10:15.  She verbalized understanding of prep instructions and to arrive at 8:45.

## 2013-09-16 ENCOUNTER — Telehealth: Payer: Self-pay | Admitting: Gastroenterology

## 2013-09-16 ENCOUNTER — Other Ambulatory Visit: Payer: Self-pay

## 2013-09-16 MED ORDER — PEG-KCL-NACL-NASULF-NA ASC-C 100 G PO SOLR: 1.0000 | Freq: Once | ORAL | Status: DC

## 2013-09-16 NOTE — Telephone Encounter (Signed)
Movi prep resent to patient's pharmacy.

## 2013-09-19 ENCOUNTER — Encounter (HOSPITAL_COMMUNITY)
Admission: RE | Disposition: A | Discharge status: Home / Self Care | Payer: Self-pay | Source: Ambulatory Visit | Attending: Gastroenterology

## 2013-09-19 ENCOUNTER — Ambulatory Visit (HOSPITAL_COMMUNITY)
Admission: RE | Admit: 2013-09-19 | Discharge: 2013-09-19 | Disposition: A | Discharge status: Home / Self Care | Payer: BC Managed Care – PPO | Source: Ambulatory Visit | Attending: Gastroenterology | Admitting: Gastroenterology

## 2013-09-19 ENCOUNTER — Ambulatory Visit (HOSPITAL_COMMUNITY): Payer: BC Managed Care – PPO | Admitting: Anesthesiology

## 2013-09-19 ENCOUNTER — Encounter (HOSPITAL_COMMUNITY): Payer: BC Managed Care – PPO | Admitting: Anesthesiology

## 2013-09-19 ENCOUNTER — Encounter (HOSPITAL_COMMUNITY): Payer: Self-pay | Admitting: *Deleted

## 2013-09-19 ENCOUNTER — Other Ambulatory Visit: Payer: Self-pay

## 2013-09-19 DIAGNOSIS — D124 Benign neoplasm of descending colon: Secondary | ICD-10-CM

## 2013-09-19 DIAGNOSIS — Z1211 Encounter for screening for malignant neoplasm of colon: Secondary | ICD-10-CM

## 2013-09-19 DIAGNOSIS — K219 Gastro-esophageal reflux disease without esophagitis: Secondary | ICD-10-CM | POA: Insufficient documentation

## 2013-09-19 DIAGNOSIS — Z87891 Personal history of nicotine dependence: Secondary | ICD-10-CM | POA: Insufficient documentation

## 2013-09-19 DIAGNOSIS — I10 Essential (primary) hypertension: Secondary | ICD-10-CM | POA: Insufficient documentation

## 2013-09-19 DIAGNOSIS — F411 Generalized anxiety disorder: Secondary | ICD-10-CM | POA: Insufficient documentation

## 2013-09-19 DIAGNOSIS — K429 Umbilical hernia without obstruction or gangrene: Secondary | ICD-10-CM | POA: Diagnosis not present

## 2013-09-19 DIAGNOSIS — D126 Benign neoplasm of colon, unspecified: Secondary | ICD-10-CM | POA: Diagnosis not present

## 2013-09-19 DIAGNOSIS — E669 Obesity, unspecified: Secondary | ICD-10-CM | POA: Insufficient documentation

## 2013-09-19 DIAGNOSIS — F3289 Other specified depressive episodes: Secondary | ICD-10-CM | POA: Diagnosis not present

## 2013-09-19 DIAGNOSIS — Z79899 Other long term (current) drug therapy: Secondary | ICD-10-CM | POA: Diagnosis not present

## 2013-09-19 DIAGNOSIS — F329 Major depressive disorder, single episode, unspecified: Secondary | ICD-10-CM | POA: Diagnosis not present

## 2013-09-19 HISTORY — PX: COLONOSCOPY WITH PROPOFOL: SHX5780

## 2013-09-19 SURGERY — COLONOSCOPY WITH PROPOFOL
Anesthesia: Monitor Anesthesia Care

## 2013-09-19 MED ORDER — LACTATED RINGERS IV SOLN
INTRAVENOUS | Status: DC | PRN
  Administered 2013-09-19: 10:00:00 via INTRAVENOUS

## 2013-09-19 MED ORDER — MIDAZOLAM HCL 5 MG/5ML IJ SOLN
INTRAMUSCULAR | Status: DC | PRN
  Administered 2013-09-19: 2 mg via INTRAVENOUS

## 2013-09-19 MED ORDER — SODIUM CHLORIDE 0.9 % IV SOLN: INTRAVENOUS | Status: DC

## 2013-09-19 MED ORDER — PROPOFOL INFUSION 10 MG/ML OPTIME
INTRAVENOUS | Status: DC | PRN
Start: 2013-09-19 — End: 2013-09-19
  Administered 2013-09-19: 80 ug/kg/min via INTRAVENOUS

## 2013-09-19 MED ORDER — KETAMINE HCL 10 MG/ML IJ SOLN
INTRAMUSCULAR | Status: DC | PRN
  Administered 2013-09-19: 20 mg via INTRAVENOUS

## 2013-09-19 SURGICAL SUPPLY — 22 items

## 2013-09-19 NOTE — Transfer of Care (Signed)
Immediate Anesthesia Transfer of Care Note  Patient: Frances Medina  Procedure(s) Performed: Procedure(s): COLONOSCOPY WITH PROPOFOL (N/A)  Patient Location: PACU  Anesthesia Type:MAC  Level of Consciousness: sedated  Airway & Oxygen Therapy: Patient Spontanous Breathing and Patient connected to nasal cannula oxygen  Post-op Assessment: Report given to PACU RN and Post -op Vital signs reviewed and stable  Post vital signs: Reviewed and stable  Complications: No apparent anesthesia complications

## 2013-09-19 NOTE — H&P (View-Only) (Signed)
PREOPERATIVE H&P  Chief Complaint: right knee djd  HPI: Frances Medina is a 53 y.o. female who presents for preoperative history and physical with a diagnosis of right knee djd. Symptoms are rated as moderate to severe, and have been worsening.  This is significantly impairing activities of daily living.  She has elected for surgical management. Failed injections, NSAIDs, activity modification.  Past Medical History  Diagnosis Date  . GERD (gastroesophageal reflux disease)   . 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 Hypertension   . Knee injury   . Depression   . Arthritis   . Frequent headaches   . Anxiety   . Seasonal allergies     takes Zytrec  . Fibroid uterus     size of a dime  . Umbilical hernia     watching , no plans for surgery at present   Past Surgical History  Procedure Laterality Date  . Cesarean section      x2  . Oophorectomy    . Dilation and curettage of uterus     History   Social History  . Marital Status: Married    Spouse Name: N/A    Number of Children: N/A  . Years of Education: N/A   Social History Main Topics  . Smoking status: Former Smoker -- 0.50 packs/day for 40 years    Quit date: 05/14/2012  . Smokeless tobacco: Never Used     Comment: quit in 2014, smoked for 40 years  . Alcohol Use: No  . Drug Use: No  . Sexual Activity: None   Other Topics Concern  . None   Social History Narrative   Work or School: [employer] Situation: lives with husband and daughters      Spiritual Beliefs: Christian      Lifestyle: no regular exercise; trying to eat healthy            Family History  Problem Relation Age of Onset  . Heart disease Mother 47  . Hypertension Mother   . Stroke Father   . Hypertension Father   . Colon cancer Neg Hx    Allergies  Allergen Reactions  . Dicyclomine Hives  . Penicillins Hives   Prior to Admission medications   Medication Sig Start Date End Date Taking? Authorizing  Provider  cetirizine (ZYRTEC) 10 MG tablet Take 10 mg by mouth daily.   Yes Historical Provider, MD  clonazePAM (KLONOPIN) 1 MG tablet Take 0.5 tablets (0.5 mg total) by mouth daily as needed for anxiety. 06/30/13  Yes Carvel Getting, NP  diclofenac (VOLTAREN) 75 MG EC tablet Take 75 mg by mouth 2 (two) times daily.   Yes Historical Provider, MD  MOVIPREP [place] (no substitutions)-TAKE AS DIRECTED. 08/17/13  Yes Ladene Artist, MD  phentermine 37.5 MG capsule Take 37.5 mg by mouth daily.   Yes Historical Provider, MD  sertraline (ZOLOFT) 100 MG tablet Take 100 mg by mouth daily.     Yes Historical Provider, MD     Positive ROS: All other systems have been reviewed and were otherwise negative with the exception of those mentioned in the HPI and as above.  Physical Exam: General: Alert, no acute distress Cardiovascular: No pedal edema Respiratory: No cyanosis, no use of accessory musculature GI: No organomegaly, abdomen is soft and non-tender Skin: No lesions in the area of chief complaint Neurologic: Sensation intact distally Psychiatric: Patient is  competent for consent with normal mood and affect Lymphatic: No axillary or cervical lymphadenopathy  MUSCULOSKELETAL: right knee ROM 0-100, positive varus and crepitance.  Assessment: right knee djd  Plan: Plan for Procedure(s): RIGHT UNICOMPARTMENTAL KNEE  The risks benefits and alternatives were discussed with the patient including but not limited to the risks of nonoperative treatment, versus surgical intervention including infection, bleeding, nerve injury,  blood clots, cardiopulmonary complications, morbidity, mortality, among others, and they were willing to proceed.   Johnny Bridge, MD Cell [phone]   08/30/2013 7:22 AM

## 2013-09-19 NOTE — Interval H&P Note (Signed)
History and Physical Interval Note:  09/19/2013 8:35 AM  [name]  has presented today for surgery, with the diagnosis of screening colonoscopy  The various methods of treatment have been discussed with the patient and family. After consideration of risks, benefits and other options for treatment, the patient has consented to  Procedure(s): COLONOSCOPY WITH PROPOFOL (N/A) as a surgical intervention .  The patient's history has been reviewed, patient examined, no change in status, stable for surgery.  I have reviewed the patient's chart and labs.  Questions were answered to the patient's satisfaction.     Pricilla Riffle. Fuller Plan MD

## 2013-09-19 NOTE — Anesthesia Preprocedure Evaluation (Signed)
Anesthesia Evaluation  Patient identified by MRN, date of birth, ID band Patient awake    Reviewed: Allergy & Precautions, H&P , NPO status , Patient's Chart, lab work & pertinent test results, reviewed documented beta blocker date and time   Airway Mallampati: II TM Distance: <3 FB     Dental  (+) Dental Advisory Given, Poor Dentition, Loose,    Pulmonary former smoker,  breath sounds clear to auscultation  Pulmonary exam normal       Cardiovascular hypertension, Pt. on medications Rhythm:Regular Rate:Normal     Neuro/Psych  Headaches, Anxiety Depression    GI/Hepatic Neg liver ROS, hiatal hernia, GERD-  ,  Endo/Other  negative endocrine ROSMorbid obesity  Renal/GU negative Renal ROS     Musculoskeletal negative musculoskeletal ROS (+)   Abdominal (+) + obese,   Peds  Hematology negative hematology ROS (+)   Anesthesia Other Findings   Reproductive/Obstetrics                           Anesthesia Physical Anesthesia Plan  ASA: II  Anesthesia Plan: MAC   Post-op Pain Management:    Induction: Intravenous  Airway Management Planned: Simple Face Mask  Additional Equipment:   Intra-op Plan:   Post-operative Plan: Extubation in OR  Informed Consent: I have reviewed the patients History and Physical, chart, labs and discussed the procedure including the risks, benefits and alternatives for the proposed anesthesia with the patient or authorized representative who has indicated his/her understanding and acceptance.   Dental advisory given  Plan Discussed with:   Anesthesia Plan Comments:         Anesthesia Quick Evaluation

## 2013-09-19 NOTE — H&P (Deleted)
HPI  Frances Medina  is a 53 year old white female known with history of a pyloric ulcer in 2012. She also has history of severe COPD, still smoking but now on chronic O2. Patient had a recent admission 3/29 through 4/1 on the hospitalist service with low-grade GI bleeding and hematemesis as well as complaints of nausea vomiting and abdominal pain. She had a hemoglobin of 8.9 on discharge from the hospital and did not require transfusion. She underwent upper endoscopy per Dr. Ardis Hughs and was found to have a clean-based distal gastric ulcer measuring 1.5 cm with partial gastric outlet obstruction. Biopsies were taken and were negative for H. pylori and dysplasia. Was felt she should have repeat endoscopy in 6 weeks. Patient is a long-time smoker, also had been taking Excedrin, BC's and Goody powders regularly prior to this admission. She says she has migraines.   She has been taking Protonix 40 mg by mouth twice daily, has Zofran at home but says this is not helpful for nausea. Her pain is worse early in the morning hours and late  in the afternoon and not necessarily worse after eating. There is no dysphagia or odynophagia. She's not had any hematemesis melena or hematochezia.     Review of Systems  Constitutional: Positive for appetite change and fatigue.  HENT: Negative.   Eyes: Negative.   Respiratory: Positive for shortness of breath.   Gastrointestinal: Positive for nausea, vomiting and abdominal pain.  Endocrine: Negative.   Genitourinary: Negative.   Musculoskeletal: Negative.   Skin: Negative.   Allergic/Immunologic: Negative.   Neurological: Negative.   Hematological: Negative.   Psychiatric/Behavioral: Negative.      Outpatient Prescriptions Prior to Visit   Medication  Sig  Dispense  Refill   .  albuterol (PROVENTIL HFA;VENTOLIN HFA) 108 (90 BASE) MCG/ACT inhaler  Inhale 2 puffs into the lungs every 2 (two) hours as needed for wheezing or shortness of breath.   1 each   0   .   docusate sodium 100 MG CAPS  Take 100 mg by mouth 2 (two) times daily.   10 capsule   0   .  ondansetron (ZOFRAN) 4 MG tablet  Take 1 tablet (4 mg total) by mouth every 6 (six) hours.   20 tablet   0   .  pantoprazole (PROTONIX) 40 MG tablet  Take 1 tablet (40 mg total) by mouth 2 (two) times daily before a meal.   60 tablet   0   .  tiotropium (SPIRIVA) 18 MCG inhalation capsule  Place 1 capsule (18 mcg total) into inhaler and inhale daily.   30 capsule   12   .  feeding supplement, ENSURE COMPLETE, (ENSURE COMPLETE) LIQD  Take 237 mLs by mouth 3 (three) times daily with meals.         .  nicotine (NICODERM CQ - DOSED IN MG/24 HOURS) 21 mg/24hr patch  Place 1 patch (21 mg total) onto the skin daily.   28 patch   0   .  oxyCODONE (OXY IR/ROXICODONE) 5 MG immediate release tablet  Take 1 tablet (5 mg total) by mouth every 6 (six) hours as needed for moderate pain.   30 tablet   0       No facility-administered medications prior to visit.      No Known Allergies Patient Active Problem List     Diagnosis  Date Noted   .  Gastric ulcer, acute  06/17/2013   .  Protein-calorie malnutrition, severe  05/30/2013   .  GI bleed  05/29/2013   .  Abdominal pain  05/29/2013   .  Acute blood loss anemia  05/29/2013   .  Smoker  01/11/2012   .  COPD GOLD IV with mild reversibility stilll smoking   01/08/2012   .  Pyloric ulcer  11/28/2010   .  CONSTIPATION  05/07/2010   .  CHOLELITHIASIS  05/07/2010   .  ABNORMAL EXAM-BILIARY TRACT  05/07/2010   .  ABNORMAL FINDINGS GI TRACT  05/07/2010       History   Substance Use Topics   .  Smoking status:  Current Every Day Smoker -- 1.00 packs/day for 37 years       Types:  Cigarettes   .  Smokeless tobacco:  Never Used         Comment: tobacco given 06/17/13   .  Alcohol Use:  No      family history includes Breast cancer in her mother; Diabetes in her mother; Hypertension in her father; Prostate cancer in her father; Stomach cancer in her father. There  is no history of Colon cancer or Esophageal cancer.       Objective:     Physical Examwell-developed very thin chronically ill-appearing white female in no acute distress she is supposed to be on O2 but not wearing at this office visit -blood pressure 120/70 pulse 70 height 5 foot 9 weight 120 BMI 17. HEENT; nontraumatic normocephalic EOMI PERRLA sclera anicteric, Supple; no JVD, Cardiovascular ;regular rate and rhythm with S1-S2 no murmur or gallop, Pulmonary; clear bilaterally, Abdomen; sof,t flat she is tender in across the epigastrium there is no rebound some voluntary guarding no palpable mass or hepatosplenomegaly, Rectal; exam not done, Exrt; no clubbing cyanosis or edema skin warm and dry, Psych ;mood and affect appropriate       Assessment & Plan:    1.  Recurrent ulcer disease. Recent admission with a large distal gastric ulcer with partial outlet obstruction secondary to smoking and aspirin abuse. Patient with recurrent nausea and vomiting and persistent complaints of pain. Symptoms suggestive of persistent ulcer with partial outlet obstruction.  2.  Severe COPD now O2 dependent  3.  Anemia secondary to blood loss   Plan: Protonix 40 mg by mouth twice daily, and advised patient she may need this medication for several months and at least once daily long-term given recurrent disease. Advise no aspirin or NSAIDs of any type Importance of smoking cessation reiterated-she says she wants to stop smoking and will get the Nicoderm patches Start Phenergan 25 mg take one half to one tablet every 6 hours as needed for nausea She is scheduled for upper endoscopy. Procedure scheduled at the hospital due to COPD with O2 dependence. Procedure discussed in detail with the patient and she is agreeable to proceed.          Ladene Artist, MD at 06/18/2013 11:13 PM

## 2013-09-19 NOTE — Anesthesia Postprocedure Evaluation (Signed)
Anesthesia Post Note  Patient: Frances Medina  Procedure(s) Performed: Procedure(s) (LRB): COLONOSCOPY WITH PROPOFOL (N/A)  Anesthesia type: MAC  Patient location: PACU  Post pain: Pain level controlled  Post assessment: Post-op Vital signs reviewed  Last Vitals:  Filed Vitals:   09/19/13 1105  BP: 165/72  Pulse: 99  Temp:   Resp: 27    Post vital signs: Reviewed  Level of consciousness: sedated  Complications: No apparent anesthesia complications

## 2013-09-19 NOTE — Op Note (Signed)
[facility] [place], [address]   COLONOSCOPY PROCEDURE REPORT  PATIENT: Frances, Medina  MR#: [id number] BIRTHDATE: Sep 15, 1960 , 17  yrs. old GENDER: Female ENDOSCOPIST: Ladene Artist, MD, [facility] REFERRED CB:ULAGTX Maudie Mercury, D.O. PROCEDURE DATE:  09/19/2013 PROCEDURE:   Colonoscopy with snare polypectomy First Screening Colonoscopy - Avg.  risk and is 50 yrs.  old or older Yes.  Prior Negative Screening - Now for repeat screening. N/A  History of Adenoma - Now for follow-up colonoscopy & has been > or = to 3 yrs.  N/A  Polyps Removed Today? Yes. ASA CLASS:   Class III INDICATIONS:average risk screening. MEDICATIONS: MAC sedation, administered by CRNA DESCRIPTION OF PROCEDURE:   After the risks benefits and alternatives of the procedure were thoroughly explained, informed consent was obtained.  A digital rectal exam revealed no abnormalities of the rectum.   The EC-[id number] ([id number])  endoscope was introduced through the anus and advanced to the cecum, which was identified by both the appendix and ileocecal valve. No adverse events experienced.   The quality of the prep was good, using MoviPrep  The instrument was then slowly withdrawn as the colon was fully examined.  COLON FINDINGS: Four sessile polyps measuring 5-6 mm in size were found in the sigmoid colon.  A polypectomy was performed with a cold snare.  The resection was complete and the polyp tissue was completely retrieved.   The colon was otherwise normal.  There was no diverticulosis, inflammation, polyps or cancers unless previously stated.  Retroflexed views revealed no abnormalities. The time to cecum=2 minutes 30 seconds.  Withdrawal time=14 minutes 00 seconds.  The scope was withdrawn and the procedure completed.  COMPLICATIONS: There were no complications.  ENDOSCOPIC IMPRESSION: 1.   Four sessile polyps measuring 5-6 in the sigmoid colon; polypectomy performed with a cold snare 2.    The colon was otherwise normal  RECOMMENDATIONS: 1.  Await pathology results 2.  Repeat colonoscopy in 5 years if polyp(s) adenomatous; otherwise 10 years  eSigned:  Ladene Artist, MD, [facility] 09/19/2013 10:24 AM

## 2013-09-20 ENCOUNTER — Encounter: Payer: Self-pay | Admitting: Gastroenterology

## 2013-09-20 ENCOUNTER — Encounter (HOSPITAL_COMMUNITY): Payer: Self-pay | Admitting: Gastroenterology

## 2013-11-10 ENCOUNTER — Encounter (HOSPITAL_COMMUNITY): Payer: Self-pay

## 2013-11-10 ENCOUNTER — Encounter (HOSPITAL_BASED_OUTPATIENT_CLINIC_OR_DEPARTMENT_OTHER): Payer: Self-pay | Admitting: *Deleted

## 2013-11-10 NOTE — Progress Notes (Signed)
Pt sch 12/02/13-dr stark has a note in her name-wrong pt-says she has copd on 02 -not her,

## 2013-11-14 ENCOUNTER — Ambulatory Visit: Payer: Self-pay | Admitting: Orthopedic Surgery

## 2013-11-29 ENCOUNTER — Encounter (HOSPITAL_BASED_OUTPATIENT_CLINIC_OR_DEPARTMENT_OTHER): Payer: Self-pay | Admitting: *Deleted

## 2013-11-29 NOTE — Progress Notes (Signed)
Patient's chart reviewed by Dr Ola Spurr, BMI 54. No hx OSA, HTN, DM. Ok for [facility] and anesthesia airway consult waived.

## 2013-11-30 ENCOUNTER — Encounter (HOSPITAL_BASED_OUTPATIENT_CLINIC_OR_DEPARTMENT_OTHER)
Admission: RE | Admit: 2013-11-30 | Discharge: 2013-11-30 | Disposition: A | Discharge status: Home / Self Care | Payer: BC Managed Care – PPO | Source: Ambulatory Visit | Attending: Orthopedic Surgery | Admitting: Orthopedic Surgery

## 2013-11-30 ENCOUNTER — Ambulatory Visit (HOSPITAL_BASED_OUTPATIENT_CLINIC_OR_DEPARTMENT_OTHER)
Admission: RE | Admit: 2013-11-30 | Discharge: 2013-12-03 | Disposition: A | Discharge status: Home / Self Care | Payer: BC Managed Care – PPO | Source: Ambulatory Visit | Attending: Orthopedic Surgery | Admitting: Orthopedic Surgery

## 2013-11-30 DIAGNOSIS — M1712 Unilateral primary osteoarthritis, left knee: Secondary | ICD-10-CM | POA: Insufficient documentation

## 2013-11-30 DIAGNOSIS — K219 Gastro-esophageal reflux disease without esophagitis: Secondary | ICD-10-CM | POA: Diagnosis not present

## 2013-11-30 DIAGNOSIS — K429 Umbilical hernia without obstruction or gangrene: Secondary | ICD-10-CM | POA: Insufficient documentation

## 2013-11-30 DIAGNOSIS — F329 Major depressive disorder, single episode, unspecified: Secondary | ICD-10-CM | POA: Diagnosis not present

## 2013-11-30 DIAGNOSIS — G43909 Migraine, unspecified, not intractable, without status migrainosus: Secondary | ICD-10-CM | POA: Insufficient documentation

## 2013-11-30 DIAGNOSIS — M1710 Unilateral primary osteoarthritis, unspecified knee: Secondary | ICD-10-CM | POA: Diagnosis present

## 2013-11-30 DIAGNOSIS — Z87891 Personal history of nicotine dependence: Secondary | ICD-10-CM | POA: Diagnosis not present

## 2013-11-30 DIAGNOSIS — F3289 Other specified depressive episodes: Secondary | ICD-10-CM | POA: Diagnosis not present

## 2013-11-30 DIAGNOSIS — F419 Anxiety disorder, unspecified: Secondary | ICD-10-CM | POA: Insufficient documentation

## 2013-11-30 DIAGNOSIS — Z888 Allergy status to other drugs, medicaments and biological substances status: Secondary | ICD-10-CM | POA: Diagnosis not present

## 2013-11-30 DIAGNOSIS — F411 Generalized anxiety disorder: Secondary | ICD-10-CM | POA: Diagnosis not present

## 2013-11-30 DIAGNOSIS — Z880 Allergy status to penicillin: Secondary | ICD-10-CM | POA: Diagnosis not present

## 2013-11-30 HISTORY — DX: Unilateral primary osteoarthritis, left knee: M17.12

## 2013-11-30 LAB — CBC
HCT: 38.1 % (ref 36.0–46.0)
Hemoglobin: 12.2 g/dL (ref 12.0–15.0)
MCH: 24.1 pg — ABNORMAL LOW (ref 26.0–34.0)
MCHC: 32 g/dL (ref 30.0–36.0)
MCV: 75.1 fL — ABNORMAL LOW (ref 78.0–100.0)
Platelets: ADEQUATE 10*3/uL (ref 150–400)
RBC: 5.07 MIL/uL (ref 3.87–5.11)
RDW: 16.4 % — ABNORMAL HIGH (ref 11.5–15.5)
WBC: 6.7 10*3/uL (ref 4.0–10.5)

## 2013-11-30 LAB — BASIC METABOLIC PANEL
Anion gap: 14 (ref 5–15)
BUN: 9 mg/dL (ref 6–23)
CO2: 26 mEq/L (ref 19–32)
Calcium: 9.4 mg/dL (ref 8.4–10.5)
Chloride: 103 mEq/L (ref 96–112)
Creatinine, Ser: 0.67 mg/dL (ref 0.50–1.10)
GFR calc Af Amer: >90 mL/min (ref >90)
GFR calc non Af Amer: >90 mL/min (ref >90)
Glucose, Bld: 94 mg/dL (ref 70–99)
Potassium: 4.8 mEq/L (ref 3.7–5.3)
Sodium: 143 mEq/L (ref 137–147)

## 2013-12-01 DIAGNOSIS — K219 Gastro-esophageal reflux disease without esophagitis: Secondary | ICD-10-CM | POA: Diagnosis not present

## 2013-12-01 DIAGNOSIS — Z87891 Personal history of nicotine dependence: Secondary | ICD-10-CM | POA: Diagnosis not present

## 2013-12-01 DIAGNOSIS — M1712 Unilateral primary osteoarthritis, left knee: Secondary | ICD-10-CM | POA: Diagnosis present

## 2013-12-01 DIAGNOSIS — G43909 Migraine, unspecified, not intractable, without status migrainosus: Secondary | ICD-10-CM | POA: Diagnosis not present

## 2013-12-01 DIAGNOSIS — F419 Anxiety disorder, unspecified: Secondary | ICD-10-CM | POA: Diagnosis not present

## 2013-12-01 DIAGNOSIS — K429 Umbilical hernia without obstruction or gangrene: Secondary | ICD-10-CM | POA: Diagnosis not present

## 2013-12-01 DIAGNOSIS — F329 Major depressive disorder, single episode, unspecified: Secondary | ICD-10-CM | POA: Diagnosis not present

## 2013-12-02 ENCOUNTER — Ambulatory Visit (HOSPITAL_COMMUNITY): Payer: BC Managed Care – PPO

## 2013-12-02 ENCOUNTER — Encounter (HOSPITAL_BASED_OUTPATIENT_CLINIC_OR_DEPARTMENT_OTHER)
Admission: RE | Disposition: A | Discharge status: Home / Self Care | Payer: Self-pay | Source: Ambulatory Visit | Attending: Orthopedic Surgery

## 2013-12-02 ENCOUNTER — Encounter (HOSPITAL_BASED_OUTPATIENT_CLINIC_OR_DEPARTMENT_OTHER): Payer: BC Managed Care – PPO | Admitting: Anesthesiology

## 2013-12-02 ENCOUNTER — Encounter (HOSPITAL_BASED_OUTPATIENT_CLINIC_OR_DEPARTMENT_OTHER): Payer: Self-pay | Admitting: *Deleted

## 2013-12-02 ENCOUNTER — Ambulatory Visit (HOSPITAL_BASED_OUTPATIENT_CLINIC_OR_DEPARTMENT_OTHER): Payer: BC Managed Care – PPO | Admitting: Anesthesiology

## 2013-12-02 DIAGNOSIS — M1712 Unilateral primary osteoarthritis, left knee: Secondary | ICD-10-CM | POA: Diagnosis not present

## 2013-12-02 DIAGNOSIS — M1710 Unilateral primary osteoarthritis, unspecified knee: Secondary | ICD-10-CM | POA: Diagnosis present

## 2013-12-02 HISTORY — DX: Unilateral primary osteoarthritis, left knee: M17.12

## 2013-12-02 HISTORY — PX: PARTIAL KNEE ARTHROPLASTY: SHX2174

## 2013-12-02 LAB — POCT HEMOGLOBIN-HEMACUE: Hemoglobin: 12.2 g/dL (ref 12.0–15.0)

## 2013-12-02 SURGERY — ARTHROPLASTY, KNEE, UNICOMPARTMENTAL
Anesthesia: Regional | Site: Knee | Laterality: Left

## 2013-12-02 MED ORDER — CEFAZOLIN SODIUM-DEXTROSE 2-3 GM-% IV SOLR
INTRAVENOUS | Status: AC
  Filled 2013-12-02: qty 50

## 2013-12-02 MED ORDER — DEXTROSE 5 % IV SOLN
3.0000 g | Freq: Four times a day (QID) | INTRAVENOUS | Status: AC
  Administered 2013-12-02 – 2013-12-03 (×3): 3 g via INTRAVENOUS

## 2013-12-02 MED ORDER — FENTANYL CITRATE 0.05 MG/ML IJ SOLN
INTRAMUSCULAR | Status: AC
  Filled 2013-12-02: qty 4

## 2013-12-02 MED ORDER — PANTOPRAZOLE SODIUM 40 MG PO TBEC
40.0000 mg | DELAYED_RELEASE_TABLET | Freq: Every day | ORAL | Status: DC
  Administered 2013-12-02: 40 mg via ORAL
  Filled 2013-12-02: qty 1

## 2013-12-02 MED ORDER — HYDROMORPHONE HCL 1 MG/ML IJ SOLN
0.2500 mg | INTRAMUSCULAR | Status: DC | PRN
  Administered 2013-12-02 (×2): 0.5 mg via INTRAVENOUS

## 2013-12-02 MED ORDER — SUCCINYLCHOLINE CHLORIDE 20 MG/ML IJ SOLN
INTRAMUSCULAR | Status: DC | PRN
  Administered 2013-12-02: 100 mg via INTRAVENOUS

## 2013-12-02 MED ORDER — METOCLOPRAMIDE HCL 5 MG/ML IJ SOLN: 5.0000 mg | Freq: Three times a day (TID) | INTRAMUSCULAR | Status: DC | PRN

## 2013-12-02 MED ORDER — MIDAZOLAM HCL 2 MG/2ML IJ SOLN
INTRAMUSCULAR | Status: AC
  Filled 2013-12-02: qty 2

## 2013-12-02 MED ORDER — DEXTROSE 5 % IV SOLN
3.0000 g | INTRAVENOUS | Status: AC
  Administered 2013-12-02: 3 g via INTRAVENOUS

## 2013-12-02 MED ORDER — FENTANYL CITRATE 0.05 MG/ML IJ SOLN
INTRAMUSCULAR | Status: DC | PRN
Start: 2013-12-02 — End: 2013-12-02
  Administered 2013-12-02 (×4): 25 ug via INTRAVENOUS

## 2013-12-02 MED ORDER — SERTRALINE HCL 100 MG PO TABS
100.0000 mg | ORAL_TABLET | Freq: Every morning | ORAL | Status: DC
  Administered 2013-12-02: 50 mg via ORAL

## 2013-12-02 MED ORDER — ONDANSETRON HCL 4 MG/2ML IJ SOLN
INTRAMUSCULAR | Status: DC | PRN
  Administered 2013-12-02: 4 mg via INTRAVENOUS

## 2013-12-02 MED ORDER — DOCUSATE SODIUM 100 MG PO CAPS
100.0000 mg | ORAL_CAPSULE | Freq: Two times a day (BID) | ORAL | Status: DC
Start: 2013-12-02 — End: 2013-12-03
  Administered 2013-12-02 (×2): 100 mg via ORAL
  Filled 2013-12-02 (×2): qty 1

## 2013-12-02 MED ORDER — SODIUM CHLORIDE 0.9 % IV SOLN
INTRAVENOUS | Status: DC
  Administered 2013-12-02: 13:00:00 via INTRAVENOUS

## 2013-12-02 MED ORDER — HYDROMORPHONE HCL 1 MG/ML IJ SOLN: 0.5000 mg | INTRAMUSCULAR | Status: DC | PRN

## 2013-12-02 MED ORDER — MIDAZOLAM HCL 2 MG/2ML IJ SOLN
1.0000 mg | INTRAMUSCULAR | Status: DC | PRN
  Administered 2013-12-02: 2 mg via INTRAVENOUS

## 2013-12-02 MED ORDER — ONDANSETRON HCL 4 MG PO TABS: 4.0000 mg | ORAL_TABLET | Freq: Three times a day (TID) | ORAL | Status: DC | PRN

## 2013-12-02 MED ORDER — BISACODYL 10 MG RE SUPP: 10.0000 mg | Freq: Every day | RECTAL | Status: DC | PRN

## 2013-12-02 MED ORDER — LIDOCAINE HCL (CARDIAC) 20 MG/ML IV SOLN
INTRAVENOUS | Status: DC | PRN
  Administered 2013-12-02: 50 mg via INTRAVENOUS

## 2013-12-02 MED ORDER — OXYCODONE-ACETAMINOPHEN 5-325 MG PO TABS: 1.0000 | ORAL_TABLET | Freq: Four times a day (QID) | ORAL | Status: DC | PRN

## 2013-12-02 MED ORDER — SCOPOLAMINE 1 MG/3DAYS TD PT72
MEDICATED_PATCH | TRANSDERMAL | Status: AC
  Filled 2013-12-02: qty 1

## 2013-12-02 MED ORDER — BUPIVACAINE-EPINEPHRINE (PF) 0.5% -1:200000 IJ SOLN
INTRAMUSCULAR | Status: DC | PRN
  Administered 2013-12-02: 30 mL via PERINEURAL

## 2013-12-02 MED ORDER — BACLOFEN 10 MG PO TABS: 10.0000 mg | ORAL_TABLET | Freq: Three times a day (TID) | ORAL | Status: DC

## 2013-12-02 MED ORDER — OXYCODONE HCL 5 MG PO TABS: 5.0000 mg | ORAL_TABLET | ORAL | Status: DC | PRN

## 2013-12-02 MED ORDER — METHOCARBAMOL 500 MG PO TABS: 500.0000 mg | ORAL_TABLET | Freq: Four times a day (QID) | ORAL | Status: DC | PRN

## 2013-12-02 MED ORDER — CEFAZOLIN SODIUM 1-5 GM-% IV SOLN
INTRAVENOUS | Status: AC
  Filled 2013-12-02: qty 50

## 2013-12-02 MED ORDER — ONDANSETRON HCL 4 MG/2ML IJ SOLN: 4.0000 mg | Freq: Four times a day (QID) | INTRAMUSCULAR | Status: DC | PRN

## 2013-12-02 MED ORDER — OXYCODONE-ACETAMINOPHEN 5-325 MG PO TABS
1.0000 | ORAL_TABLET | ORAL | Status: DC | PRN
  Administered 2013-12-02: 1 via ORAL
  Administered 2013-12-03 (×2): 2 via ORAL
  Filled 2013-12-02: qty 1
  Filled 2013-12-02 (×2): qty 2

## 2013-12-02 MED ORDER — SENNA-DOCUSATE SODIUM 8.6-50 MG PO TABS: 2.0000 | ORAL_TABLET | Freq: Every day | ORAL | Status: DC

## 2013-12-02 MED ORDER — LACTATED RINGERS IV SOLN: INTRAVENOUS | Status: DC

## 2013-12-02 MED ORDER — PROPOFOL 10 MG/ML IV BOLUS
INTRAVENOUS | Status: DC | PRN
  Administered 2013-12-02: 200 mg via INTRAVENOUS
  Administered 2013-12-02: 60 mg via INTRAVENOUS

## 2013-12-02 MED ORDER — SUFENTANIL CITRATE 50 MCG/ML IV SOLN
INTRAVENOUS | Status: AC
  Filled 2013-12-02: qty 1

## 2013-12-02 MED ORDER — MAGNESIUM CITRATE PO SOLN: 1.0000 | Freq: Once | ORAL | Status: AC | PRN

## 2013-12-02 MED ORDER — SCOPOLAMINE 1 MG/3DAYS TD PT72
1.0000 | MEDICATED_PATCH | Freq: Once | TRANSDERMAL | Status: DC
  Administered 2013-12-02: 1.5 mg via TRANSDERMAL

## 2013-12-02 MED ORDER — OXYCODONE HCL 5 MG PO TABS: 5.0000 mg | ORAL_TABLET | Freq: Once | ORAL | Status: AC | PRN

## 2013-12-02 MED ORDER — SENNA 8.6 MG PO TABS
1.0000 | ORAL_TABLET | Freq: Two times a day (BID) | ORAL | Status: DC
  Administered 2013-12-02: 8.6 mg via ORAL
  Filled 2013-12-02: qty 1

## 2013-12-02 MED ORDER — DEXAMETHASONE SODIUM PHOSPHATE 4 MG/ML IJ SOLN
INTRAMUSCULAR | Status: DC | PRN
  Administered 2013-12-02: 8 mg via INTRAVENOUS

## 2013-12-02 MED ORDER — OXYCODONE HCL 5 MG/5ML PO SOLN: 5.0000 mg | Freq: Once | ORAL | Status: AC | PRN

## 2013-12-02 MED ORDER — CLONAZEPAM 0.5 MG PO TABS
0.5000 mg | ORAL_TABLET | Freq: Two times a day (BID) | ORAL | Status: DC | PRN
  Administered 2013-12-02: 0.5 mg via ORAL

## 2013-12-02 MED ORDER — CEFAZOLIN SODIUM-DEXTROSE 2-3 GM-% IV SOLR
INTRAVENOUS | Status: AC
  Filled 2013-12-02: qty 100

## 2013-12-02 MED ORDER — SODIUM CHLORIDE 0.9 % IR SOLN
Status: DC | PRN
  Administered 2013-12-02: 1000 mL

## 2013-12-02 MED ORDER — POLYETHYLENE GLYCOL 3350 17 G PO PACK: 17.0000 g | PACK | Freq: Every day | ORAL | Status: DC | PRN

## 2013-12-02 MED ORDER — METOCLOPRAMIDE HCL 5 MG PO TABS
5.0000 mg | ORAL_TABLET | Freq: Three times a day (TID) | ORAL | Status: DC | PRN
Start: 2013-12-02 — End: 2013-12-03

## 2013-12-02 MED ORDER — ONDANSETRON HCL 4 MG PO TABS: 4.0000 mg | ORAL_TABLET | Freq: Four times a day (QID) | ORAL | Status: DC | PRN

## 2013-12-02 MED ORDER — FENTANYL CITRATE 0.05 MG/ML IJ SOLN
INTRAMUSCULAR | Status: AC
  Filled 2013-12-02: qty 2

## 2013-12-02 MED ORDER — METHOCARBAMOL 1000 MG/10ML IJ SOLN: 500.0000 mg | Freq: Four times a day (QID) | INTRAVENOUS | Status: DC | PRN

## 2013-12-02 MED ORDER — LORATADINE 10 MG PO TABS
10.0000 mg | ORAL_TABLET | Freq: Every day | ORAL | Status: DC
  Administered 2013-12-02: 10 mg via ORAL

## 2013-12-02 MED ORDER — EPHEDRINE SULFATE 50 MG/ML IJ SOLN
INTRAMUSCULAR | Status: DC | PRN
  Administered 2013-12-02 (×2): 10 mg via INTRAVENOUS

## 2013-12-02 MED ORDER — FENTANYL CITRATE 0.05 MG/ML IJ SOLN
50.0000 ug | INTRAMUSCULAR | Status: DC | PRN
  Administered 2013-12-02: 100 ug via INTRAVENOUS

## 2013-12-02 MED ORDER — PROPOFOL 10 MG/ML IV BOLUS
INTRAVENOUS | Status: AC
  Filled 2013-12-02: qty 20

## 2013-12-02 MED ORDER — 0.9 % SODIUM CHLORIDE (POUR BTL) OPTIME
TOPICAL | Status: DC | PRN
  Administered 2013-12-02: 1000 mL

## 2013-12-02 MED ORDER — LACTATED RINGERS IV SOLN
INTRAVENOUS | Status: DC
  Administered 2013-12-02 (×2): via INTRAVENOUS

## 2013-12-02 MED ORDER — ZOLPIDEM TARTRATE 5 MG PO TABS
5.0000 mg | ORAL_TABLET | Freq: Every evening | ORAL | Status: DC | PRN
  Administered 2013-12-02: 5 mg via ORAL
  Filled 2013-12-02: qty 1

## 2013-12-02 MED ORDER — HYDROMORPHONE HCL 1 MG/ML IJ SOLN
INTRAMUSCULAR | Status: AC
  Filled 2013-12-02: qty 1

## 2013-12-02 MED ORDER — RIVAROXABAN 10 MG PO TABS: 10.0000 mg | ORAL_TABLET | Freq: Every day | ORAL | Status: DC

## 2013-12-02 SURGICAL SUPPLY — 76 items
APL SKNCLS STERI-STRIP NONHPOA (GAUZE/BANDAGES/DRESSINGS) ×1
BANDAGE ELASTIC 6 VELCRO ST LF (GAUZE/BANDAGES/DRESSINGS) ×4 IMPLANT
BANDAGE ESMARK 6X9 LF (GAUZE/BANDAGES/DRESSINGS) ×1 IMPLANT
BENZOIN TINCTURE PRP APPL 2/3 (GAUZE/BANDAGES/DRESSINGS) ×2 IMPLANT
BLADE SURG 10 STRL SS (BLADE) ×2 IMPLANT
BLADE SURG 15 STRL LF DISP TIS (BLADE) ×2 IMPLANT
BLADE SURG 15 STRL SS (BLADE) ×4
BNDG CMPR 9X6 STRL LF SNTH (GAUZE/BANDAGES/DRESSINGS) ×1
BNDG ESMARK 6X9 LF (GAUZE/BANDAGES/DRESSINGS) ×2
BOWL SMART MIX CTS (DISPOSABLE) ×2 IMPLANT
CANISTER SUCT 1200ML W/VALVE (MISCELLANEOUS) IMPLANT
CAPT KNEE OXFORD ×1 IMPLANT
CEMENT HV SMART SET (Cement) ×2 IMPLANT
CLSR STERI-STRIP ANTIMIC 1/2X4 (GAUZE/BANDAGES/DRESSINGS) ×2 IMPLANT
COVER TABLE BACK 60X90 (DRAPES) ×2 IMPLANT
CUFF TOURNIQUET SINGLE 34IN LL (TOURNIQUET CUFF) IMPLANT
DECANTER SPIKE VIAL GLASS SM (MISCELLANEOUS) IMPLANT
DRAPE EXTREMITY TIBURON (DRAPES) ×2 IMPLANT
DRAPE U 20/CS (DRAPES) ×2 IMPLANT
DRAPE U-SHAPE 47X51 STRL (DRAPES) ×2 IMPLANT
DRSG PAD ABDOMINAL 8X10 ST (GAUZE/BANDAGES/DRESSINGS) ×2 IMPLANT
DURAPREP 26ML APPLICATOR (WOUND CARE) ×2 IMPLANT
ELECT REM PT RETURN 9FT ADLT (ELECTROSURGICAL) ×2
ELECTRODE REM PT RTRN 9FT ADLT (ELECTROSURGICAL) ×1 IMPLANT
FACESHIELD WRAPAROUND (MASK) ×6 IMPLANT
FACESHIELD WRAPAROUND OR TEAM (MASK) ×2 IMPLANT
GAUZE SPONGE 4X4 12PLY STRL (GAUZE/BANDAGES/DRESSINGS) ×2 IMPLANT
GLOVE BIO SURGEON STRL SZ8 (GLOVE) ×3 IMPLANT
GLOVE BIOGEL PI IND STRL 7.0 (GLOVE) IMPLANT
GLOVE BIOGEL PI IND STRL 8 (GLOVE) ×2 IMPLANT
GLOVE BIOGEL PI INDICATOR 7.0 (GLOVE) ×2
GLOVE BIOGEL PI INDICATOR 8 (GLOVE) ×3
GLOVE ECLIPSE 6.5 STRL STRAW (GLOVE) ×3 IMPLANT
GLOVE EXAM NITRILE MD LF STRL (GLOVE) ×1 IMPLANT
GLOVE ORTHO TXT STRL SZ7.5 (GLOVE) ×2 IMPLANT
GOWN STRL REUS W/ TWL LRG LVL3 (GOWN DISPOSABLE) ×1 IMPLANT
GOWN STRL REUS W/ TWL XL LVL3 (GOWN DISPOSABLE) ×2 IMPLANT
GOWN STRL REUS W/TWL LRG LVL3 (GOWN DISPOSABLE) ×4
GOWN STRL REUS W/TWL XL LVL3 (GOWN DISPOSABLE) ×6
HANDPIECE INTERPULSE COAX TIP (DISPOSABLE) ×2
IMMOBILIZER KNEE 22  40 CIR (ORTHOPEDIC SUPPLIES) ×1
IMMOBILIZER KNEE 22 40 CIR (ORTHOPEDIC SUPPLIES) IMPLANT
IMMOBILIZER KNEE 22 UNIV (SOFTGOODS) ×2 IMPLANT
IMMOBILIZER KNEE 24 THIGH 36 (MISCELLANEOUS) ×1 IMPLANT
IMMOBILIZER KNEE 24 UNIV (MISCELLANEOUS) ×2
KNEE WRAP E Z 3 GEL PACK (MISCELLANEOUS) ×1 IMPLANT
MANIFOLD NEPTUNE II (INSTRUMENTS) ×2 IMPLANT
NS IRRIG 1000ML POUR BTL (IV SOLUTION) ×2 IMPLANT
PACK ARTHROSCOPY DSU (CUSTOM PROCEDURE TRAY) ×2 IMPLANT
PACK BASIN DAY SURGERY FS (CUSTOM PROCEDURE TRAY) ×2 IMPLANT
PENCIL BUTTON HOLSTER BLD 10FT (ELECTRODE) ×2 IMPLANT
SAWBLADE OXFORD PARTIAL (BLADE) ×2 IMPLANT
SET HNDPC FAN SPRY TIP SCT (DISPOSABLE) ×1 IMPLANT
SHEET MEDIUM DRAPE 40X70 STRL (DRAPES) ×2 IMPLANT
SLEEVE SCD COMPRESS KNEE MED (MISCELLANEOUS) ×2 IMPLANT
SLEEVE SURGEON STRL (DRAPES) ×1 IMPLANT
SPONGE LAP 18X18 X RAY DECT (DISPOSABLE) ×2 IMPLANT
STAPLER VISISTAT 35W (STAPLE) IMPLANT
SUCTION FRAZIER TIP 10 FR DISP (SUCTIONS) ×2 IMPLANT
SUT ETHILON 3 0 PS 1 (SUTURE) IMPLANT
SUT ETHILON 4 0 PS 2 18 (SUTURE) IMPLANT
SUT MNCRL AB 4-0 PS2 18 (SUTURE) IMPLANT
SUT VIC AB 0 CT1 27 (SUTURE) ×2
SUT VIC AB 0 CT1 27XBRD ANBCTR (SUTURE) ×1 IMPLANT
SUT VIC AB 2-0 SH 27 (SUTURE) ×2
SUT VIC AB 2-0 SH 27XBRD (SUTURE) ×1 IMPLANT
SUT VIC AB 3-0 SH 27 (SUTURE) ×2
SUT VIC AB 3-0 SH 27X BRD (SUTURE) ×1 IMPLANT
SUT VICRYL 3-0 CR8 SH (SUTURE) IMPLANT
SUT VICRYL 4-0 PS2 18IN ABS (SUTURE) IMPLANT
SYR BULB IRRIGATION 50ML (SYRINGE) ×2 IMPLANT
TOWEL OR 17X24 6PK STRL BLUE (TOWEL DISPOSABLE) ×4 IMPLANT
TOWEL OR NON WOVEN STRL DISP B (DISPOSABLE) ×4 IMPLANT
TUBE CONNECTING 20X1/4 (TUBING) IMPLANT
UNDERPAD 30X30 INCONTINENT (UNDERPADS AND DIAPERS) ×2 IMPLANT
YANKAUER SUCT BULB TIP NO VENT (SUCTIONS) IMPLANT

## 2013-12-02 NOTE — Discharge Instructions (Signed)
Diet: As you were doing prior to hospitalization  ° °Shower:  May shower but keep the wounds dry, use an occlusive plastic wrap, NO SOAKING IN TUB.  If the bandage gets wet, change with a clean dry gauze. ° °Dressing:  You may change your dressing 3-5 days after surgery.  Then change the dressing daily with sterile gauze dressing.   ° °There are sticky tapes (steri-strips) on your wounds and all the stitches are absorbable.  Leave the steri-strips in place when changing your dressings, they will peel off with time, usually 2-3 weeks. ° °Activity:  Increase activity slowly as tolerated, but follow the weight bearing instructions below.  No lifting or driving for 6 weeks. ° °Weight Bearing:   As tolerated.   ° °To prevent constipation: you may use a stool softener such as - ° °Colace (over the counter) 100 mg by mouth twice a day  °Drink plenty of fluids (prune juice may be helpful) and high fiber foods °Miralax (over the counter) for constipation as needed.   ° °Itching:  If you experience itching with your medications, try taking only a single pain pill, or even half a pain pill at a time.  You may take up to 10 pain pills per day, and you can also use benadryl over the counter for itching or also to help with sleep.  ° °Precautions:  If you experience chest pain or shortness of breath - call 911 immediately for transfer to the hospital emergency department!! ° °If you develop a fever greater that 101 F, purulent drainage from wound, increased redness or drainage from wound, or calf pain -- Call the office at [phone]                                                °Follow- Up Appointment:  Please call for an appointment to be seen in 2 weeks [place] - [phone] ° ° ° ° ° °Post Anesthesia Home Care Instructions ° °Activity: °Get plenty of rest for the remainder of the day. A responsible adult should stay with you for 24 hours following the procedure.  °For the next 24 hours, DO NOT: °-Drive a car °-Operate  machinery °-Drink alcoholic beverages °-Take any medication unless instructed by your physician °-Make any legal decisions or sign important papers. ° °Meals: °Start with liquid foods such as gelatin or soup. Progress to regular foods as tolerated. Avoid greasy, spicy, heavy foods. If nausea and/or vomiting occur, drink only clear liquids until the nausea and/or vomiting subsides. Call your physician if vomiting continues. ° °Special Instructions/Symptoms: °Your throat may feel dry or sore from the anesthesia or the breathing tube placed in your throat during surgery. If this causes discomfort, gargle with warm salt water. The discomfort should disappear within 24 hours. ° ° °Regional Anesthesia Blocks ° °1. Numbness or the inability to move the "blocked" extremity may last from 3-48 hours after placement. The length of time depends on the medication injected and your individual response to the medication. If the numbness is not going away after 48 hours, call your surgeon. ° °2. The extremity that is blocked will need to be protected until the numbness is gone and the  Strength has returned. Because you cannot feel it, you will need to take extra care to avoid injury. Because it may be weak, you may have difficulty   moving it or using it. You may not know what position it is in without looking at it while the block is in effect. ° °3. For blocks in the legs and feet, returning to weight bearing and walking needs to be done carefully. You will need to wait until the numbness is entirely gone and the strength has returned. You should be able to move your leg and foot normally before you try and bear weight or walk. You will need someone to be with you when you first try to ensure you do not fall and possibly risk injury. ° °4. Bruising and tenderness at the needle site are common side effects and will resolve in a few days. ° °5. Persistent numbness or new problems with movement should be communicated to the surgeon  or the [facility] ([phone])/ [facility] ([phone]). °

## 2013-12-02 NOTE — Progress Notes (Signed)
Assisted Dr. Oren Bracket with left, ultrasound guided, femoral nerve block. Side rails up, monitors on throughout procedure. See vital signs in flow sheet. Tolerated Procedure well.

## 2013-12-02 NOTE — Anesthesia Preprocedure Evaluation (Signed)
Anesthesia Evaluation  Patient identified by MRN, date of birth, ID band Patient awake    Reviewed: Allergy & Precautions, H&P , NPO status , Patient's Chart, lab work & pertinent test results  Airway Mallampati: II TM Distance: >3 FB Neck ROM: Full    Dental no notable dental hx. (+) Teeth Intact, Dental Advisory Given   Pulmonary neg pulmonary ROS, former smoker,  breath sounds clear to auscultation  Pulmonary exam normal       Cardiovascular hypertension, Rhythm:Regular Rate:Normal     Neuro/Psych  Headaches, Anxiety Depression negative neurological ROS     GI/Hepatic Neg liver ROS, GERD-  Controlled,  Endo/Other  Morbid obesity  Renal/GU negative Renal ROS  negative genitourinary   Musculoskeletal  (+) Arthritis -, Osteoarthritis,    Abdominal   Peds  Hematology negative hematology ROS (+)   Anesthesia Other Findings   Reproductive/Obstetrics negative OB ROS                           Anesthesia Physical Anesthesia Plan  ASA: III  Anesthesia Plan: General and Regional   Post-op Pain Management:    Induction: Intravenous  Airway Management Planned: Oral ETT  Additional Equipment:   Intra-op Plan:   Post-operative Plan: Extubation in OR  Informed Consent: I have reviewed the patients History and Physical, chart, labs and discussed the procedure including the risks, benefits and alternatives for the proposed anesthesia with the patient or authorized representative who has indicated his/her understanding and acceptance.   Dental advisory given  Plan Discussed with: CRNA  Anesthesia Plan Comments:         Anesthesia Quick Evaluation

## 2013-12-02 NOTE — H&P (Signed)
PREOPERATIVE H&P  Chief Complaint: LEFT KNEE DEGENERATIVE ARTHRITIS  HPI: Frances Medina is a 53 y.o. female who presents for preoperative history and physical with a diagnosis of LEFT KNEE DEGENERATIVE ARTHRITIS. Symptoms are rated as moderate to severe, and have been worsening.  This is significantly impairing activities of daily living.  She has elected for surgical management.  She has failed injections, activity modification, anti-inflammatories, and assistive devices. She's had her other side replaced and wishes to have the same operation on the right side.   Past Medical History  Diagnosis Date  . GERD (gastroesophageal reflux disease)   . 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 Knee injury   . Depression   . Seasonal allergies     takes Zytrec  . Fibroid uterus     size of a dime  . Umbilical hernia     watching , no plans for surgery at present  . H/O hiatal hernia   . Hypertension     "went away when I stopped smoking"  . Migraine     "maybe couple times/month" (08/30/2013)  . Arthritis     "knees" (08/30/2013)  . Osteoarthritis of right knee 08/30/2013  . Anxiety    Past Surgical History  Procedure Laterality Date  . Cesarean section  1987; 1989  . Dilation and curettage of uterus    . Replacement unicondylar joint knee Right 08/30/2013  . Tubal ligation  1989  . Vaginal hysterectomy  1990's?    "apparently took out one of my ovaries at the time too cause one's missing"  . Partial knee arthroplasty Right 08/30/2013    Procedure: RIGHT UNICOMPARTMENTAL KNEE;  Surgeon: Johnny Bridge, MD;  Location: [place];  Service: Orthopedics;  Laterality: Right;  . Colonoscopy with propofol N/A 09/19/2013    Procedure: COLONOSCOPY WITH PROPOFOL;  Surgeon: Ladene Artist, MD;  Location: [facility];  Service: Endoscopy;  Laterality: N/A;   History   Social History  . Marital Status: Married    Spouse Name: N/A    Number of Children: N/A  . Years of Education: N/A   Social History  Main Topics  . Smoking status: Former Smoker -- 0.50 packs/day for 40 years    Types: Cigarettes    Quit date: 05/14/2012  . Smokeless tobacco: Never Used  . Alcohol Use: No  . Drug Use: No  . Sexual Activity: Yes    Birth Control/ Protection: Surgical   Other Topics Concern  . None   Social History Narrative   Work or School: [employer] Situation: lives with husband and daughters      Spiritual Beliefs: Christian      Lifestyle: no regular exercise; trying to eat healthy            Family History  Problem Relation Age of Onset  . Heart disease Mother 24  . Hypertension Mother   . Stroke Father   . Hypertension Father   . Colon cancer Neg Hx    Allergies  Allergen Reactions  . Dicyclomine Hives  . Methocarbamol Hives  . Penicillins Hives   Prior to Admission medications   Medication Sig Start Date End Date Taking? Authorizing Provider  cetirizine (ZYRTEC) 10 MG tablet Take 10 mg by mouth daily.   Yes Historical Provider, MD  clonazePAM (KLONOPIN) 1 MG tablet Take 0.5-1 mg by mouth 2 (two) times daily as needed for anxiety.   Yes  Historical Provider, MD  diclofenac (VOLTAREN) 75 MG EC tablet Take 75 mg by mouth 2 (two) times daily as needed (Pain).   Yes Historical Provider, MD  sertraline (ZOLOFT) 100 MG tablet Take 100 mg by mouth every morning.    Yes Historical Provider, MD     Positive ROS: All other systems have been reviewed and were otherwise negative with the exception of those mentioned in the HPI and as above.  Physical Exam: General: Alert, no acute distress Cardiovascular: No pedal edema Respiratory: No cyanosis, no use of accessory musculature GI: No organomegaly, abdomen is soft and non-tender Skin: No lesions in the area of chief complaint Neurologic: Sensation intact distally Psychiatric: Patient is competent for consent with normal mood and affect Lymphatic: No axillary or cervical  lymphadenopathy  MUSCULOSKELETAL: Left knee has positive medial joint line tenderness, pseudo-laxity to valgus testing, positive crepitance, range of motion 0-130.  X-rays demonstrate left knee osteophyte formation, subchondral sclerosis, and complete loss of joint space in the medial side.  Assessment: LEFT KNEE DEGENERATIVE ARTHRITIS  Plan: Plan for Procedure(s): LEFT KNEE UNI ARTHROPLASTY  The risks benefits and alternatives were discussed with the patient including but not limited to the risks of nonoperative treatment, versus surgical intervention including infection, bleeding, nerve injury,  blood clots, cardiopulmonary complications, morbidity, mortality, among others, and they were willing to proceed.   Johnny Bridge, MD Cell [phone]   12/02/2013 7:18 AM

## 2013-12-02 NOTE — Anesthesia Procedure Notes (Addendum)
Anesthesia Regional Block:  Femoral nerve block  Pre-Anesthetic Checklist: ,, timeout performed, Correct Patient, Correct Site, Correct Laterality, Correct Procedure, Correct Position, site marked, Risks and benefits discussed, pre-op evaluation,  At surgeon's request and post-op pain management  Laterality: Left  Prep: Maximum Sterile Barrier Precautions used and chloraprep       Needles:  Injection technique: Single-shot  Needle Type: Echogenic Stimulator Needle     Needle Length: 5cm 5 cm Needle Gauge: 22 and 22 G    Additional Needles:  Procedures: ultrasound guided (picture in chart) and nerve stimulator Femoral nerve block  Nerve Stimulator or Paresthesia:  Response: Patellar respose,   Additional Responses:   Narrative:  Start time: 12/02/2013 9:10 AM End time: 12/02/2013 9:19 AM Injection made incrementally with aspirations every 5 mL. Anesthesiologist: Fitzgerald,MD  Additional Notes: 2% Lidocaine skin wheel.    Procedure Name: Intubation Date/Time: 12/02/2013 10:16 AM Performed by: Lieutenant Diego Pre-anesthesia Checklist: Patient identified, Emergency Drugs available, Suction available and Patient being monitored Patient Re-evaluated:Patient Re-evaluated prior to inductionOxygen Delivery Method: Circle System Utilized Preoxygenation: Pre-oxygenation with 100% oxygen Intubation Type: IV induction Ventilation: Mask ventilation without difficulty Laryngoscope Size: Miller and 2 Grade View: Grade I Tube type: Oral Tube size: 7.0 mm Number of attempts: 1 Airway Equipment and Method: stylet and oral airway Placement Confirmation: ETT inserted through vocal cords under direct vision,  positive ETCO2 and breath sounds checked- equal and bilateral Secured at: 22 cm Tube secured with: Tape Dental Injury: Teeth and Oropharynx as per pre-operative assessment

## 2013-12-02 NOTE — Transfer of Care (Signed)
Immediate Anesthesia Transfer of Care Note  Patient: Frances Medina  Procedure(s) Performed: Procedure(s): LEFT KNEE UNI ARTHROPLASTY (Left)  Patient Location: PACU  Anesthesia Type:General and GA combined with regional for post-op pain  Level of Consciousness: awake and alert   Airway & Oxygen Therapy: Patient Spontanous Breathing and Patient connected to face mask oxygen  Post-op Assessment: Report given to PACU RN and Post -op Vital signs reviewed and stable  Post vital signs: Reviewed and stable  Complications: No apparent anesthesia complications

## 2013-12-02 NOTE — Anesthesia Postprocedure Evaluation (Signed)
  Anesthesia Post-op Note  Patient: Frances Medina  Procedure(s) Performed: Procedure(s): LEFT KNEE UNI ARTHROPLASTY (Left)  Patient Location: PACU  Anesthesia Type:General and block  Level of Consciousness: awake and alert   Airway and Oxygen Therapy: Patient Spontanous Breathing  Post-op Pain: mild  Post-op Assessment: Post-op Vital signs reviewed, Patient's Cardiovascular Status Stable and Respiratory Function Stable  Post-op Vital Signs: Reviewed  Filed Vitals:   12/02/13 1345  BP: 132/83  Pulse: 105  Temp:   Resp: 26    Complications: No apparent anesthesia complications

## 2013-12-02 NOTE — Op Note (Signed)
12/02/2013  11:52 AM  PATIENT:  Frances Medina    PRE-OPERATIVE DIAGNOSIS:  LEFT KNEE MEDIAL COMPARTMENT DEGENERATIVE ARTHRITIS  POST-OPERATIVE DIAGNOSIS:  Same  PROCEDURE:  LEFT KNEE UNI ARTHROPLASTY  SURGEON:  Johnny Bridge, MD  PHYSICIAN ASSISTANT: Joya Gaskins, OPA-C, present and scrubbed throughout the case, critical for completion in a timely fashion, and for retraction, instrumentation, and closure.  Second assistant: Mechele Claude, PA Student  ANESTHESIA:   General  PREOPERATIVE INDICATIONS:  Frances Medina is a  53 y.o. female with a diagnosis of LEFT KNEE DEGENERATIVE ARTHRITIS who failed conservative measures and elected for surgical management.    The risks benefits and alternatives were discussed with the patient preoperatively including but not limited to the risks of infection, bleeding, nerve injury, cardiopulmonary complications, blood clots, the need for revision surgery, among others, and the patient was willing to proceed.  OPERATIVE IMPLANTS: Biomet [employer] mobile bearing medial compartment arthroplasty femur size small, tibia size a, bearing size 4.  OPERATIVE FINDINGS: Endstage grade 4 medial compartment osteoarthritis. No significant changes in the lateral or patellofemoral joint.  The ACL was intact.  OPERATIVE PROCEDURE: The patient was brought to the operating room placed in supine position. General anesthesia was administered. IV antibiotics were given. The lower extremity was placed in the legholder and prepped and draped in usual sterile fashion.  Time out was performed.  The leg was elevated and exsanguinated and the tourniquet was inflated. Anteromedial incision was performed, and I took care to preserve the MCL. Parapatellar incision was carried out, and the osteophytes were excised, along with the medial meniscus and a small portion of the fat pad.  The extra medullary tibial cutting jig was applied, using the spoon and the 57mm G-Clamp and the 2 mm  shim, and I took care to protect the anterior cruciate ligament insertion and the tibial spine. The medial collateral ligament was also protected, and I resected my proximal tibia, matching the anatomic slope.   The proximal tibial bony cut was removed in one piece, and I turned my attention to the femur.  The intramedullary femoral rod was placed using the drill, and then using the appropriate reference, I assembled the femoral jig, setting my posterior cutting block. I resected my posterior femur, used the 0 spigot for the anterior femur, and then measured my gap. Initially, even with the 3 mm paddle, the flexion gap was still very tight, so I went back and I resected 2 more millimeters off of the tibia by replacing the guidepin and cheek by hand. I then went back and trialed, and was able to fit a 4 mm paddle into place, although it was slightly tight.  I then used the appropriate mill to match the extension gap to the flexion gap. The second milling was at a 4.  The gaps were then measured again with the appropriate feeler gauges. Once I had balanced flexion and extension gaps, I then completed the preparation of the femur.  I milled off the anterior aspect of the distal femur to prevent impingement. I also exposed the tibia, and selected the above-named component, and then used the cutting jig to prepare the keel slot on the tibia. I also used the awl to curette out the bone to complete the preparation of the keel. The back wall was intact.  I then placed trial components, and it was found to have excellent motion, and appropriate balance.  I then cemented the components into place, cementing the tibia first,  removing all excess cement, and then cementing the femur.  All loose cement was removed.  The real polyethylene insert was applied manually, and the knee was taken through functional range of motion, and found to have excellent stability and restoration of joint motion, with excellent  balance.  The wounds were irrigated copiously, and the parapatellar tissue closed with Vicryl, followed by Vicryl for the subcutaneous tissue, with routine closure with Steri-Strips and sterile gauze.  The tourniquet was released, and the patient was awakened and extubated and returned to PACU in stable and satisfactory condition. There were no complications.

## 2013-12-02 NOTE — OR Nursing (Signed)
Fritzi Mandes, RN and Valla Leaver, CRNA did pre-procedural checklist.  Documented incorrectly at 1005 as Fritzi Mandes, RN and April Carter, CRNA.

## 2013-12-03 DIAGNOSIS — M1712 Unilateral primary osteoarthritis, left knee: Secondary | ICD-10-CM | POA: Diagnosis not present

## 2013-12-05 ENCOUNTER — Encounter (HOSPITAL_BASED_OUTPATIENT_CLINIC_OR_DEPARTMENT_OTHER): Payer: Self-pay | Admitting: Orthopedic Surgery

## 2014-01-16 ENCOUNTER — Other Ambulatory Visit: Payer: Self-pay | Admitting: Orthopedic Surgery

## 2014-01-16 DIAGNOSIS — M5442 Lumbago with sciatica, left side: Secondary | ICD-10-CM

## 2014-01-22 ENCOUNTER — Ambulatory Visit
Admission: RE | Admit: 2014-01-22 | Discharge: 2014-01-22 | Disposition: A | Discharge status: Home / Self Care | Payer: BC Managed Care – PPO | Source: Ambulatory Visit | Attending: Orthopedic Surgery | Admitting: Orthopedic Surgery

## 2014-01-22 DIAGNOSIS — M5442 Lumbago with sciatica, left side: Secondary | ICD-10-CM

## 2014-03-09 ENCOUNTER — Other Ambulatory Visit: Payer: Self-pay | Admitting: Neurosurgery

## 2014-03-09 DIAGNOSIS — M5136 Other intervertebral disc degeneration, lumbar region: Secondary | ICD-10-CM

## 2014-03-16 ENCOUNTER — Ambulatory Visit
Admission: RE | Admit: 2014-03-16 | Discharge: 2014-03-16 | Disposition: A | Discharge status: Home / Self Care | Payer: BLUE CROSS/BLUE SHIELD | Source: Ambulatory Visit | Attending: Neurosurgery | Admitting: Neurosurgery

## 2014-03-16 ENCOUNTER — Other Ambulatory Visit: Payer: Self-pay | Admitting: Neurosurgery

## 2014-03-16 DIAGNOSIS — M5136 Other intervertebral disc degeneration, lumbar region: Secondary | ICD-10-CM

## 2014-03-16 MED ORDER — ONDANSETRON HCL 4 MG/2ML IJ SOLN: 4.0000 mg | Freq: Four times a day (QID) | INTRAMUSCULAR | Status: DC | PRN

## 2014-03-16 MED ORDER — DIAZEPAM 5 MG PO TABS
10.0000 mg | ORAL_TABLET | Freq: Once | ORAL | Status: AC
  Administered 2014-03-16: 10 mg via ORAL

## 2014-03-16 MED ORDER — DIPHENHYDRAMINE HCL 50 MG PO CAPS
50.0000 mg | ORAL_CAPSULE | Freq: Once | ORAL | Status: AC
  Administered 2014-03-16: 50 mg via ORAL

## 2014-03-16 MED ORDER — IOHEXOL 180 MG/ML  SOLN
15.0000 mL | Freq: Once | INTRAMUSCULAR | Status: AC | PRN
  Administered 2014-03-16: 15 mL via INTRATHECAL

## 2014-03-16 NOTE — Discharge Instructions (Signed)
Myelogram Discharge Instructions  1. Go home and rest quietly for the next 24 hours.  It is important to lie flat for the next 24 hours.  Get up only to go to the restroom.  You may lie in the bed or on a couch on your back, your stomach, your left side or your right side.  You may have one pillow under your head.  You may have pillows between your knees while you are on your side or under your knees while you are on your back.  2. DO NOT drive today.  Recline the seat as far back as it will go, while still wearing your seat belt, on the way home.  3. You may get up to go to the bathroom as needed.  You may sit up for 10 minutes to eat.  You may resume your normal diet and medications unless otherwise indicated.  Drink lots of extra fluids today and tomorrow.  4. The incidence of headache, nausea, or vomiting is about 5% (one in 20 patients).  If you develop a headache, lie flat and drink plenty of fluids until the headache goes away.  Caffeinated beverages may be helpful.  If you develop severe nausea and vomiting or a headache that does not go away with flat bed rest, call [phone].  5. You may resume normal activities after your 24 hours of bed rest is over; however, do not exert yourself strongly or do any heavy lifting tomorrow. If when you get up you have a headache when standing, go back to bed and force fluids for another 24 hours.  6. Call your physician for a follow-up appointment.  The results of your myelogram will be sent directly to your physician by the following day.  7. If you have any questions or if complications develop after you arrive home, please call [phone].  Discharge instructions have been explained to the patient.  The patient, or the person responsible for the patient, fully understands these instructions.       May resume Phentermine and Sertraline on Jan. 15, 2016, after 1:00 pm.

## 2014-03-16 NOTE — Progress Notes (Signed)
Pt states she has been off Phentermine and Sertraline for the past 5 days.  Discharge instructions explained to pt and husband

## 2014-07-21 ENCOUNTER — Telehealth: Payer: Self-pay | Admitting: *Deleted

## 2014-07-21 NOTE — Telephone Encounter (Signed)
Left message for pt to call back.  Need to know if pt had mammogram, when, and where

## 2014-09-26 ENCOUNTER — Other Ambulatory Visit (HOSPITAL_COMMUNITY): Payer: Self-pay | Admitting: Internal Medicine

## 2014-09-26 DIAGNOSIS — Z1231 Encounter for screening mammogram for malignant neoplasm of breast: Secondary | ICD-10-CM

## 2014-10-04 ENCOUNTER — Ambulatory Visit (HOSPITAL_COMMUNITY)
Admission: RE | Admit: 2014-10-04 | Discharge: 2014-10-04 | Disposition: A | Discharge status: Home / Self Care | Payer: BC Managed Care – PPO | Source: Ambulatory Visit | Attending: Internal Medicine | Admitting: Internal Medicine

## 2014-10-04 DIAGNOSIS — Z1231 Encounter for screening mammogram for malignant neoplasm of breast: Secondary | ICD-10-CM | POA: Diagnosis not present

## 2015-02-02 ENCOUNTER — Emergency Department (INDEPENDENT_AMBULATORY_CARE_PROVIDER_SITE_OTHER)
Admission: EM | Admit: 2015-02-02 | Discharge: 2015-02-02 | Disposition: A | Discharge status: Home / Self Care | Payer: BC Managed Care – PPO | Source: Home / Self Care

## 2015-02-02 ENCOUNTER — Encounter (HOSPITAL_COMMUNITY): Payer: Self-pay | Admitting: Emergency Medicine

## 2015-02-02 DIAGNOSIS — H02402 Unspecified ptosis of left eyelid: Secondary | ICD-10-CM

## 2015-02-02 NOTE — ED Notes (Signed)
Pt here with left eye ptosis that appeared suddenly 3 days ago Itching noted without dizziness and pain  Tried eye drops without relief Denies blurred vision

## 2015-02-02 NOTE — Discharge Instructions (Signed)
There are no emergent concerns at this time.  Contact your PCP for follow up and if he feels the eye specialist is required he can refer you at that time.   Use artificial tears to keep eye moist

## 2015-02-02 NOTE — ED Provider Notes (Signed)
CSN: [id number]     Arrival date & time 02/02/15  1313 History   None    Chief Complaint  Patient presents with  . Eye Problem   (Consider location/radiation/quality/duration/timing/severity/associated sxs/prior Treatment) HPI 54 y/o female ptosis of the left eye for 3 days. Patient states that when at work noted that her I was not closing completely. She has no pain. Have a little itching and dizziness but that is resolved now. I injury of blurred vision  Past Medical History  Diagnosis Date  . GERD (gastroesophageal reflux disease)   . 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 Knee injury   . Depression   . Seasonal allergies     takes Zytrec  . Fibroid uterus     size of a dime  . Umbilical hernia     watching , no plans for surgery at present  . H/O hiatal hernia   . Hypertension     "went away when I stopped smoking"  . Migraine     "maybe couple times/month" (08/30/2013)  . Arthritis     "knees" (08/30/2013)  . Osteoarthritis of right knee 08/30/2013  . Anxiety   . Osteoarthritis of left knee 12/02/2013   Past Surgical History  Procedure Laterality Date  . Cesarean section  1987; 1989  . Dilation and curettage of uterus    . Replacement unicondylar joint knee Right 08/30/2013  . Tubal ligation  1989  . Vaginal hysterectomy  1990's?    "apparently took out one of my ovaries at the time too cause one's missing"  . Partial knee arthroplasty Right 08/30/2013    Procedure: RIGHT UNICOMPARTMENTAL KNEE;  Surgeon: Johnny Bridge, MD;  Location: [facility];  Service: Orthopedics;  Laterality: Right;  . Colonoscopy with propofol N/A 09/19/2013    Procedure: COLONOSCOPY WITH PROPOFOL;  Surgeon: Ladene Artist, MD;  Location: [facility];  Service: Endoscopy;  Laterality: N/A;  . Partial knee arthroplasty Left 12/02/2013    Procedure: LEFT KNEE UNI ARTHROPLASTY;  Surgeon: Johnny Bridge, MD;  Location: [facility];  Service: Orthopedics;  Laterality: Left;   Family History   Problem Relation Age of Onset  . Heart disease Mother 27  . Hypertension Mother   . Stroke Father   . Hypertension Father   . Colon cancer Neg Hx    Social History  Substance Use Topics  . Smoking status: Former Smoker -- 0.50 packs/day for 40 years    Types: Cigarettes    Quit date: 05/14/2012  . Smokeless tobacco: Never Used  . Alcohol Use: No   OB History    No data available     Review of Systems +'ve ptosis left eye Denies: vision changes, headache, weakness, head injury Allergies  Dicyclomine; Methocarbamol; and Penicillins  Home Medications   Prior to Admission medications   Medication Sig Start Date End Date Taking? Authorizing Provider  baclofen (LIORESAL) 10 MG tablet Take 1 tablet (10 mg total) by mouth 3 (three) times daily. As needed for muscle spasm 12/02/13   Marchia Bond, MD  cetirizine (ZYRTEC) 10 MG tablet Take 10 mg by mouth daily.    Historical Provider, MD  clonazePAM (KLONOPIN) 1 MG tablet Take 0.5-1 mg by mouth 2 (two) times daily as needed for anxiety.    Historical Provider, MD  ondansetron (ZOFRAN) 4 MG tablet Take 1 tablet (4 mg total) by mouth every 8 (eight) hours as needed for nausea or vomiting. 12/02/13   Vonna Kotyk  Mardelle Matte, MD  oxyCODONE-acetaminophen (ROXICET) 5-325 MG per tablet Take 1-2 tablets by mouth every 6 (six) hours as needed for severe pain. 12/02/13   Marchia Bond, MD  rivaroxaban (XARELTO) 10 MG TABS tablet Take 1 tablet (10 mg total) by mouth daily. 12/02/13   Marchia Bond, MD  sennosides-docusate sodium (SENOKOT-S) 8.6-50 MG tablet Take 2 tablets by mouth daily. 12/02/13   Marchia Bond, MD  sertraline (ZOLOFT) 100 MG tablet Take 100 mg by mouth every morning.     Historical Provider, MD   Meds Ordered and Administered this Visit  Medications - No data to display  BP 146/58 mmHg  Pulse 86  Temp(Src) 97.6 F (36.4 C) (Oral)  Resp 16  SpO2 100% No data found.   Physical Exam  Constitutional: She is oriented to person, place,  and time. She appears well-developed and well-nourished.  Eyes: Conjunctivae and EOM are normal. Pupils are equal, round, and reactive to light.  There is an obvious ptosis on the left eye. Not tender.   Pulmonary/Chest: Effort normal and breath sounds normal.  Neurological: She is alert and oriented to person, place, and time. No cranial nerve deficit or sensory deficit. GCS eye subscore is 4. GCS verbal subscore is 5. GCS motor subscore is 6.  CN 2-8 intact without deficit.  Nursing note and vitals reviewed.   ED Course  Procedures (including critical care time)  Labs Review Labs Reviewed - No data to display  Imaging Review No results found.   Visual Acuity Review  Right Eye Distance:   Left Eye Distance:   Bilateral Distance:    Right Eye Near:   Left Eye Near:    Bilateral Near:         MDM   1. Ptosis of eyelid, left    Discussed with Dr. Katy Fitch and he is happy to review patient next week.  Pt does have a PCP, and pt would prefer to see her PCP first.   She will call for appointment.    Konrad Felix, [place] 02/02/15 [phone]

## 2015-03-04 DIAGNOSIS — R1310 Dysphagia, unspecified: Secondary | ICD-10-CM

## 2015-03-04 DIAGNOSIS — G7000 Myasthenia gravis without (acute) exacerbation: Secondary | ICD-10-CM

## 2015-03-04 HISTORY — DX: Myasthenia gravis without (acute) exacerbation: G70.00

## 2015-03-04 HISTORY — DX: Dysphagia, unspecified: R13.10

## 2015-03-19 ENCOUNTER — Encounter (HOSPITAL_COMMUNITY): Payer: Self-pay | Admitting: Family Medicine

## 2015-03-19 ENCOUNTER — Inpatient Hospital Stay (HOSPITAL_COMMUNITY)
Admission: EM | Admit: 2015-03-19 | Discharge: 2015-03-27 | DRG: 057 | Disposition: A | Discharge status: Home Health | Payer: BC Managed Care – PPO | Attending: Internal Medicine | Admitting: Internal Medicine

## 2015-03-19 ENCOUNTER — Emergency Department (HOSPITAL_COMMUNITY): Payer: BC Managed Care – PPO

## 2015-03-19 DIAGNOSIS — G7000 Myasthenia gravis without (acute) exacerbation: Secondary | ICD-10-CM | POA: Diagnosis not present

## 2015-03-19 DIAGNOSIS — Z23 Encounter for immunization: Secondary | ICD-10-CM

## 2015-03-19 DIAGNOSIS — B373 Candidiasis of vulva and vagina: Secondary | ICD-10-CM | POA: Diagnosis present

## 2015-03-19 DIAGNOSIS — Z7901 Long term (current) use of anticoagulants: Secondary | ICD-10-CM

## 2015-03-19 DIAGNOSIS — F419 Anxiety disorder, unspecified: Secondary | ICD-10-CM | POA: Diagnosis present

## 2015-03-19 DIAGNOSIS — R1313 Dysphagia, pharyngeal phase: Secondary | ICD-10-CM | POA: Diagnosis present

## 2015-03-19 DIAGNOSIS — G510 Bell's palsy: Secondary | ICD-10-CM | POA: Diagnosis present

## 2015-03-19 DIAGNOSIS — R1310 Dysphagia, unspecified: Secondary | ICD-10-CM | POA: Diagnosis not present

## 2015-03-19 DIAGNOSIS — K2960 Other gastritis without bleeding: Secondary | ICD-10-CM | POA: Diagnosis present

## 2015-03-19 DIAGNOSIS — J302 Other seasonal allergic rhinitis: Secondary | ICD-10-CM | POA: Diagnosis present

## 2015-03-19 DIAGNOSIS — K449 Diaphragmatic hernia without obstruction or gangrene: Secondary | ICD-10-CM | POA: Diagnosis present

## 2015-03-19 DIAGNOSIS — M1711 Unilateral primary osteoarthritis, right knee: Secondary | ICD-10-CM | POA: Diagnosis present

## 2015-03-19 DIAGNOSIS — Z880 Allergy status to penicillin: Secondary | ICD-10-CM

## 2015-03-19 DIAGNOSIS — I10 Essential (primary) hypertension: Secondary | ICD-10-CM | POA: Diagnosis present

## 2015-03-19 DIAGNOSIS — M1712 Unilateral primary osteoarthritis, left knee: Secondary | ICD-10-CM | POA: Diagnosis present

## 2015-03-19 DIAGNOSIS — F329 Major depressive disorder, single episode, unspecified: Secondary | ICD-10-CM | POA: Diagnosis present

## 2015-03-19 DIAGNOSIS — Z8249 Family history of ischemic heart disease and other diseases of the circulatory system: Secondary | ICD-10-CM

## 2015-03-19 DIAGNOSIS — K219 Gastro-esophageal reflux disease without esophagitis: Secondary | ICD-10-CM | POA: Diagnosis present

## 2015-03-19 DIAGNOSIS — Z87891 Personal history of nicotine dependence: Secondary | ICD-10-CM

## 2015-03-19 DIAGNOSIS — R471 Dysarthria and anarthria: Secondary | ICD-10-CM | POA: Diagnosis present

## 2015-03-19 DIAGNOSIS — Z96651 Presence of right artificial knee joint: Secondary | ICD-10-CM | POA: Diagnosis present

## 2015-03-19 DIAGNOSIS — Z6841 Body Mass Index (BMI) 40.0 and over, adult: Secondary | ICD-10-CM

## 2015-03-19 DIAGNOSIS — J329 Chronic sinusitis, unspecified: Secondary | ICD-10-CM | POA: Diagnosis present

## 2015-03-19 DIAGNOSIS — R718 Other abnormality of red blood cells: Secondary | ICD-10-CM | POA: Diagnosis present

## 2015-03-19 DIAGNOSIS — Z888 Allergy status to other drugs, medicaments and biological substances status: Secondary | ICD-10-CM

## 2015-03-19 DIAGNOSIS — R130 Aphagia: Secondary | ICD-10-CM | POA: Diagnosis present

## 2015-03-19 DIAGNOSIS — Z79899 Other long term (current) drug therapy: Secondary | ICD-10-CM

## 2015-03-19 LAB — CBC
HCT: 41.4 % (ref 36.0–46.0)
Hemoglobin: 13.2 g/dL (ref 12.0–15.0)
MCH: 23.6 pg — ABNORMAL LOW (ref 26.0–34.0)
MCHC: 31.9 g/dL (ref 30.0–36.0)
MCV: 74.1 fL — ABNORMAL LOW (ref 78.0–100.0)
Platelets: 185 10*3/uL (ref 150–400)
RBC: 5.59 MIL/uL — ABNORMAL HIGH (ref 3.87–5.11)
RDW: 17 % — ABNORMAL HIGH (ref 11.5–15.5)
WBC: 8.7 10*3/uL (ref 4.0–10.5)

## 2015-03-19 LAB — COMPREHENSIVE METABOLIC PANEL
ALT: 19 U/L (ref 14–54)
AST: 23 U/L (ref 15–41)
Albumin: 3.8 g/dL (ref 3.5–5.0)
Alkaline Phosphatase: 92 U/L (ref 38–126)
Anion gap: 10 (ref 5–15)
BUN: 5 mg/dL — ABNORMAL LOW (ref 6–20)
CO2: 30 mmol/L (ref 22–32)
Calcium: 9.7 mg/dL (ref 8.9–10.3)
Chloride: 102 mmol/L (ref 101–111)
Creatinine, Ser: 0.74 mg/dL (ref 0.44–1.00)
GFR calc Af Amer: >60 mL/min (ref >60)
GFR calc non Af Amer: >60 mL/min (ref >60)
Glucose, Bld: 97 mg/dL (ref 65–99)
Potassium: 4.4 mmol/L (ref 3.5–5.1)
Sodium: 142 mmol/L (ref 135–145)
Total Bilirubin: 0.4 mg/dL (ref 0.3–1.2)
Total Protein: 7.3 g/dL (ref 6.5–8.1)

## 2015-03-19 LAB — I-STAT CHEM 8, ED
BUN: 6 mg/dL (ref 6–20)
Calcium, Ion: 1.18 mmol/L (ref 1.12–1.23)
Chloride: 101 mmol/L (ref 101–111)
Creatinine, Ser: 0.7 mg/dL (ref 0.44–1.00)
Glucose, Bld: 91 mg/dL (ref 65–99)
HCT: 45 % (ref 36.0–46.0)
Hemoglobin: 15.3 g/dL — ABNORMAL HIGH (ref 12.0–15.0)
Potassium: 4.4 mmol/L (ref 3.5–5.1)
Sodium: 142 mmol/L (ref 135–145)
TCO2: 28 mmol/L (ref 0–100)

## 2015-03-19 LAB — APTT: aPTT: 55 seconds — ABNORMAL HIGH (ref 24–37)

## 2015-03-19 LAB — DIFFERENTIAL
Basophils Absolute: 0 10*3/uL (ref 0.0–0.1)
Basophils Relative: 0 %
Eosinophils Absolute: 0 10*3/uL (ref 0.0–0.7)
Eosinophils Relative: 0 %
Lymphocytes Relative: 22 %
Lymphs Abs: 1.9 10*3/uL (ref 0.7–4.0)
Monocytes Absolute: 0.3 10*3/uL (ref 0.1–1.0)
Monocytes Relative: 4 %
Neutro Abs: 6.4 10*3/uL (ref 1.7–7.7)
Neutrophils Relative %: 74 %

## 2015-03-19 LAB — PROTIME-INR
INR: 1.06 (ref 0.00–1.49)
Prothrombin Time: 14 seconds (ref 11.6–15.2)

## 2015-03-19 LAB — I-STAT TROPONIN, ED: Troponin i, poc: 0 ng/mL (ref 0.00–0.08)

## 2015-03-19 NOTE — ED Notes (Signed)
Pt sts 1 month ago she as dx with bells palsy and no since 1 week ago trouble swallowing and using a straw or holding liquids in her mouth. sts her speech has been slurred.

## 2015-03-19 NOTE — ED Provider Notes (Signed)
CSN: [id number]     Arrival date & time 03/19/15  1523 History   First MD Initiated Contact with Patient 03/19/15 2209     Chief Complaint  Patient presents with  . Dysphagia   (Consider location/radiation/quality/duration/timing/severity/associated sxs/prior Treatment) The history is provided by the patient and the spouse. No language interpreter was used.   Ms. Szasz is a 55 y.o female with a history of GERD, uterine fibroids, umbilical hernia, hiatal hernia, hypertension, migraine, arthritis, Bell's palsy, and anxiety who presents with progressive worsening difficulty swallowing for one week. She had a recent diagnosis of bells palsy 1 month ago.  She report slurred speech and difficulty speaking due to dry mouth.   She denies any fever, chills, headache, dizziness, vision changes, chest pain, shortness of breath, abdominal pain, nausea, vomiting.    Past Medical History  Diagnosis Date  . GERD (gastroesophageal reflux disease)   . 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 Knee injury   . Depression   . Seasonal allergies     takes Zytrec  . Fibroid uterus     size of a dime  . Umbilical hernia     watching , no plans for surgery at present  . H/O hiatal hernia   . Hypertension     "went away when I stopped smoking"  . Migraine     "maybe couple times/month" (08/30/2013)  . Arthritis     "knees" (08/30/2013)  . Osteoarthritis of right knee 08/30/2013  . Anxiety   . Osteoarthritis of left knee 12/02/2013   Past Surgical History  Procedure Laterality Date  . Cesarean section  1987; 1989  . Dilation and curettage of uterus    . Replacement unicondylar joint knee Right 08/30/2013  . Tubal ligation  1989  . Vaginal hysterectomy  1990's?    "apparently took out one of my ovaries at the time too cause one's missing"  . Partial knee arthroplasty Right 08/30/2013    Procedure: RIGHT UNICOMPARTMENTAL KNEE;  Surgeon: Johnny Bridge, MD;  Location: [facility];  Service: Orthopedics;  Laterality:  Right;  . Colonoscopy with propofol N/A 09/19/2013    Procedure: COLONOSCOPY WITH PROPOFOL;  Surgeon: Ladene Artist, MD;  Location: [facility];  Service: Endoscopy;  Laterality: N/A;  . Partial knee arthroplasty Left 12/02/2013    Procedure: LEFT KNEE UNI ARTHROPLASTY;  Surgeon: Johnny Bridge, MD;  Location: [facility];  Service: Orthopedics;  Laterality: Left;   Family History  Problem Relation Age of Onset  . Heart disease Mother 40  . Hypertension Mother   . Stroke Father   . Hypertension Father   . Colon cancer Neg Hx    Social History  Substance Use Topics  . Smoking status: Former Smoker -- 0.50 packs/day for 40 years    Types: Cigarettes    Quit date: 05/14/2012  . Smokeless tobacco: Never Used  . Alcohol Use: No   OB History    No data available     Review of Systems  Constitutional: Negative for fever.  Respiratory: Negative for shortness of breath.   Cardiovascular: Negative for chest pain.  Musculoskeletal: Negative for neck pain.  Neurological: Positive for facial asymmetry and speech difficulty. Negative for dizziness, weakness, numbness and headaches.  All other systems reviewed and are negative.     Allergies  Dicyclomine; Methocarbamol; and Penicillins  Home Medications   Prior to Admission medications   Medication Sig Start Date End Date Taking? Authorizing Provider  cetirizine (ZYRTEC) 10 MG tablet Take 10 mg by mouth daily as needed for allergies.    Yes Historical Provider, MD  Chlorphen-PE-Acetaminophen (NOREL AD) 4-10-325 MG TABS Take 1 tablet by mouth 2 (two) times daily as needed (FOR SINUSES).   Yes Historical Provider, MD  clonazePAM (KLONOPIN) 1 MG tablet Take 0.5-1 mg by mouth 2 (two) times daily as needed for anxiety.   Yes Historical Provider, MD  estradiol (ESTRACE) 0.5 MG tablet Take 0.5 mg by mouth daily.   Yes Historical Provider, MD  Phendimetrazine Tartrate 35 MG TABS Take 35 mg by mouth daily.   Yes Historical  Provider, MD  sertraline (ZOLOFT) 100 MG tablet Take 50 mg by mouth every morning.    Yes Historical Provider, MD  baclofen (LIORESAL) 10 MG tablet Take 1 tablet (10 mg total) by mouth 3 (three) times daily. As needed for muscle spasm 12/02/13   Marchia Bond, MD  cephALEXin (KEFLEX) 500 MG capsule Take 500 mg by mouth 4 (four) times daily. 03/05/15   Historical Provider, MD  ondansetron (ZOFRAN) 4 MG tablet Take 1 tablet (4 mg total) by mouth every 8 (eight) hours as needed for nausea or vomiting. 12/02/13   Marchia Bond, MD  oxyCODONE-acetaminophen (ROXICET) 5-325 MG per tablet Take 1-2 tablets by mouth every 6 (six) hours as needed for severe pain. 12/02/13   Marchia Bond, MD  rivaroxaban (XARELTO) 10 MG TABS tablet Take 1 tablet (10 mg total) by mouth daily. 12/02/13   Marchia Bond, MD  sennosides-docusate sodium (SENOKOT-S) 8.6-50 MG tablet Take 2 tablets by mouth daily. 12/02/13   Marchia Bond, MD   BP 103/64 mmHg  Pulse 79  Temp(Src) 97.9 F (36.6 C) (Oral)  Resp 23  Ht 5' (1.524 m)  Wt 127.461 kg  BMI 54.88 kg/m2  SpO2 97% Physical Exam  Constitutional: She is oriented to person, place, and time. She appears well-developed and well-nourished.  HENT:  Head: Normocephalic and atraumatic.  Handling secretions. Patent airway. No tonsillar edema. No trismus or drooling. Uvula midline.  Eyes: Conjunctivae are normal.  Neck: Normal range of motion. Neck supple.  No anterior cervical lymphadenopathy or pain.  Cardiovascular: Normal rate, regular rhythm and normal heart sounds.   No murmur heard. Regular rate and rhythm. No murmur.  Pulmonary/Chest: Effort normal and breath sounds normal.  Abdominal: Soft. There is no tenderness.  Morbidly obese.  Musculoskeletal: Normal range of motion.  Neurological: She is alert and oriented to person, place, and time. She has normal strength. She displays a negative Romberg sign. Coordination normal. GCS eye subscore is 4. GCS verbal subscore is 5.  GCS motor subscore is 6.  Cranial nerves V, IIIX- XII in tact. Ptosis of left eye. Able to wrinkle forehead. No changes in taste. Dry mucous membranes. No motor deficit. Negative Romberg. Normal finger to nose bilaterally. Normal heel to shin bilaterally. GCS 15.  Skin: Skin is warm and dry.  Nursing note and vitals reviewed.   ED Course  Procedures (including critical care time) Labs Review Labs Reviewed  APTT - Abnormal; Notable for the following:    aPTT 55 (*)    All other components within normal limits  CBC - Abnormal; Notable for the following:    RBC 5.59 (*)    MCV 74.1 (*)    MCH 23.6 (*)    RDW 17.0 (*)    All other components within normal limits  COMPREHENSIVE METABOLIC PANEL - Abnormal; Notable for the following:    BUN  5 (*)    All other components within normal limits  I-STAT CHEM 8, ED - Abnormal; Notable for the following:    Hemoglobin 15.3 (*)    All other components within normal limits  PROTIME-INR  DIFFERENTIAL  I-STAT TROPOININ, ED  CBG MONITORING, ED    Imaging Review Ct Head Wo Contrast  03/19/2015  CLINICAL DATA:  Dysphagia and left eye droop for 1 month. EXAM: CT HEAD WITHOUT CONTRAST TECHNIQUE: Contiguous axial images were obtained from the base of the skull through the vertex without intravenous contrast. COMPARISON:  None. FINDINGS: The ventricles are normal in size and configuration. No extra-axial fluid collections are identified. The gray-white differentiation is normal. No CT findings for acute intracranial process such as hemorrhage or infarction. No mass lesions. The brainstem and cerebellum are grossly normal. The bony structures are intact. The paranasal sinuses and mastoid air cells are clear. The globes are intact. IMPRESSION: Normal head CT. Electronically Signed   By: Marijo Sanes M.D.   On: 03/19/2015 18:28   Ct Soft Tissue Neck W Contrast  03/20/2015  CLINICAL DATA:  55 year old female with history of Bell's palsy presenting with  dysphagia and difficulty swallowing. EXAM: CT NECK WITH CONTRAST TECHNIQUE: Multidetector CT imaging of the neck was performed using the standard protocol following the bolus administration of intravenous contrast. CONTRAST:  43mL OMNIPAQUE IOHEXOL 300 MG/ML  SOLN COMPARISON:  None FINDINGS: The visualized lung apices are clear. The trachea and central airways are patent. The esophagus is grossly unremarkable. No thyroid nodule identified. The visualized carotid arteries appear patent. Minimal noncalcified plaque noted along the proximal right ICA. The parotid, and submandibular glands appear unremarkable. There is no adenopathy. There is mild degenerative changes of the spine. No acute fracture. The visualized paranasal sinuses and mastoid air cells are clear. No retropharyngeal fluid collection or abscess identified. IMPRESSION: No acute neck pathology, specifically no findings to explain patient's dysphagia. Electronically Signed   By: Anner Crete M.D.   On: 03/20/2015 01:23   I have personally reviewed and evaluated these images and lab results as part of my medical decision-making.   EKG Interpretation None      MDM   Final diagnoses:  Dysphagia  Patient presents for dysphagia with inability to swallow solids.  She also complains of dry mouth and difficulty speaking. She is able to tolerate secretions. No drooling or trismus. Her vital signs are stable. She has ptosis of the left eye but otherwise her exam is normal. She has no other deficits or speech difficulties. Her labs are not concerning. Troponin is negative. CT head is negative for acute infarct.   I spoke to Dr. Nicole Kindred with neurology due to dd of MS. He did not believe the patient needed MRI and needed GI follow up.   I spoke to Dr. Alfonse Spruce has seen and evaluated the patient. I ordered a CT soft tissue neck with IV contrast to rule out mass, food bolus, or neck abscess.  MR brain was ordered to r/o neurological cause of  dysphagia. I discussed this with the patient.   I discussed this patient with Charlann Lange, PA-C and plan depends on MR brain results.  If negative, pt will need GI and neurology follow-up. She will also need to be able to swallow liquids before going home.    Ottie Glazier, PA-C 03/20/15 [place], MD 03/23/15 [phone]

## 2015-03-20 ENCOUNTER — Emergency Department (HOSPITAL_COMMUNITY): Payer: BC Managed Care – PPO

## 2015-03-20 ENCOUNTER — Encounter (HOSPITAL_COMMUNITY): Payer: Self-pay | Admitting: Family Medicine

## 2015-03-20 DIAGNOSIS — R1310 Dysphagia, unspecified: Secondary | ICD-10-CM | POA: Diagnosis not present

## 2015-03-20 DIAGNOSIS — R130 Aphagia: Secondary | ICD-10-CM | POA: Diagnosis not present

## 2015-03-20 DIAGNOSIS — E669 Obesity, unspecified: Secondary | ICD-10-CM | POA: Diagnosis not present

## 2015-03-20 DIAGNOSIS — K219 Gastro-esophageal reflux disease without esophagitis: Secondary | ICD-10-CM | POA: Diagnosis not present

## 2015-03-20 LAB — CBC
HCT: 39.3 % (ref 36.0–46.0)
Hemoglobin: 12.6 g/dL (ref 12.0–15.0)
MCH: 23.6 pg — ABNORMAL LOW (ref 26.0–34.0)
MCHC: 32.1 g/dL (ref 30.0–36.0)
MCV: 73.6 fL — ABNORMAL LOW (ref 78.0–100.0)
Platelets: 168 10*3/uL (ref 150–400)
RBC: 5.34 MIL/uL — ABNORMAL HIGH (ref 3.87–5.11)
RDW: 16.9 % — ABNORMAL HIGH (ref 11.5–15.5)
WBC: 8.1 10*3/uL (ref 4.0–10.5)

## 2015-03-20 LAB — CREATININE, SERUM
Creatinine, Ser: 0.69 mg/dL (ref 0.44–1.00)
GFR calc Af Amer: >60 mL/min (ref >60)
GFR calc non Af Amer: >60 mL/min (ref >60)

## 2015-03-20 LAB — CBG MONITORING, ED: Glucose-Capillary: 77 mg/dL (ref 65–99)

## 2015-03-20 MED ORDER — ENOXAPARIN SODIUM 40 MG/0.4ML ~~LOC~~ SOLN
40.0000 mg | SUBCUTANEOUS | Status: DC
  Administered 2015-03-20: 40 mg via SUBCUTANEOUS
  Filled 2015-03-20: qty 0.4

## 2015-03-20 MED ORDER — ACETAMINOPHEN 500 MG PO TABS
500.0000 mg | ORAL_TABLET | Freq: Four times a day (QID) | ORAL | Status: DC | PRN
  Administered 2015-03-20 – 2015-03-27 (×10): 500 mg via ORAL
  Filled 2015-03-20 (×10): qty 1

## 2015-03-20 MED ORDER — PANTOPRAZOLE SODIUM 40 MG IV SOLR
40.0000 mg | Freq: Two times a day (BID) | INTRAVENOUS | Status: DC
  Administered 2015-03-20 – 2015-03-21 (×3): 40 mg via INTRAVENOUS
  Filled 2015-03-20 (×3): qty 40

## 2015-03-20 MED ORDER — SODIUM CHLORIDE 0.9 % IV SOLN
INTRAVENOUS | Status: AC
  Administered 2015-03-20 (×2): via INTRAVENOUS

## 2015-03-20 MED ORDER — INFLUENZA VAC SPLIT QUAD 0.5 ML IM SUSY
0.5000 mL | PREFILLED_SYRINGE | INTRAMUSCULAR | Status: AC
  Administered 2015-03-22: 0.5 mL via INTRAMUSCULAR
  Filled 2015-03-20 (×2): qty 0.5

## 2015-03-20 MED ORDER — SODIUM CHLORIDE 0.9 % IV SOLN
INTRAVENOUS | Status: DC
  Administered 2015-03-20: 10:00:00 via INTRAVENOUS

## 2015-03-20 MED ORDER — ESTRADIOL 1 MG PO TABS
0.5000 mg | ORAL_TABLET | Freq: Every day | ORAL | Status: DC
  Administered 2015-03-20 – 2015-03-27 (×8): 0.5 mg via ORAL
  Filled 2015-03-20 (×8): qty 0.5

## 2015-03-20 MED ORDER — SERTRALINE HCL 50 MG PO TABS
50.0000 mg | ORAL_TABLET | Freq: Every morning | ORAL | Status: DC
  Administered 2015-03-20 – 2015-03-27 (×8): 50 mg via ORAL
  Filled 2015-03-20 (×9): qty 1

## 2015-03-20 MED ORDER — CLONAZEPAM 0.5 MG PO TABS
0.5000 mg | ORAL_TABLET | Freq: Two times a day (BID) | ORAL | Status: DC | PRN
  Administered 2015-03-23 – 2015-03-24 (×2): 0.5 mg via ORAL
  Filled 2015-03-20 (×2): qty 1

## 2015-03-20 MED ORDER — FLUTICASONE PROPIONATE 50 MCG/ACT NA SUSP
2.0000 | Freq: Every day | NASAL | Status: DC
  Administered 2015-03-23 – 2015-03-27 (×5): 2 via NASAL
  Filled 2015-03-20 (×2): qty 16

## 2015-03-20 MED ORDER — LORAZEPAM 2 MG/ML IJ SOLN
1.0000 mg | Freq: Once | INTRAMUSCULAR | Status: AC
  Administered 2015-03-20: 1 mg via INTRAVENOUS
  Filled 2015-03-20: qty 1

## 2015-03-20 MED ORDER — IOHEXOL 300 MG/ML  SOLN
75.0000 mL | Freq: Once | INTRAMUSCULAR | Status: AC | PRN
  Administered 2015-03-20: 75 mL via INTRAVENOUS

## 2015-03-20 NOTE — H&P (Signed)
History and Physical  Patient Name: Frances Medina     [id number]    DOB: August 10, 1960    DOA: 03/19/2015 Referring physician: Charlann Lange, PA-C PCP: [name]., DO      Chief Complaint: Inability to swallow  HPI: Frances Medina is a 55 y.o. female with a past medical history significant for MO, hiatal hernia, anxiety and recent Bell's palsy who presents with difficulty swallowing and the sensation of something in her throat for 1 week.  The patient was in her normal state of health until about 6 weeks ago when she developed sudden onset LEFT ptosis. This was diagnosed as Bell's palsy in the ER and by her PCP. 2 weeks ago she was diagnosed with sinusitis and started on Keflex, but has only been taking 1 tablet per day.  Now in the last 1 week the patient reports worsening feeling of "food stuck in my throat". She has a constant sensation that there is saliva or phlegm stuck in her throat, she tries to clear it but she can't. This is worsened by food or lying down. It has gotten to the point that she feels like she can't swallow anything except hot coffee (pizza, cheesy bread make it much worse). She has not had emesis, or regurgitation of undigested food. She has chronic heartburn. She has epigastric discomfort.  she takes ibuprofen for migraines, typically 5 tablets twice a day. She does not take a PPI or H2RA.  In the ED, the patient's electrolytes and CBC were normal. She had a CT of the head that was normal and a CT of the soft tissues of the neck that was normal. An MRI of the brain was ordered to rule out stroke given her left-sided ptosis and progressive difficulty swallowing and this showed nonspecific microvascular white matter disease and partially empty sella. The patient failed a bedside swallow screen (failed the question "do you have difficulty swallowing at home" which was her presenting complaint). The case was discussed by phone with neurology and gastroenterology, who  recommended inpatient observation and gastroenterology evaluation.       Review of Systems:  Pt complains of globus, mucus in throat, stumbling, heartburn, epigastric discomfort, inability to swallow. Pt denies any fever, focal weakness, seizures, passing out, numbness, vomiting, hematemesis, hematochezia, melena.  All other systems negative except as just noted or noted in the history of present illness.  Allergies  Allergen Reactions  . Dicyclomine Hives  . Methocarbamol Hives  . Penicillins Hives    Prior to Admission medications   Medication Sig Start Date End Date Taking? Authorizing Provider  cetirizine (ZYRTEC) 10 MG tablet Take 10 mg by mouth daily as needed for allergies.    Yes Historical Provider, MD  Chlorphen-PE-Acetaminophen (NOREL AD) 4-10-325 MG TABS Take 1 tablet by mouth 2 (two) times daily as needed (FOR SINUSES).   Yes Historical Provider, MD  clonazePAM (KLONOPIN) 1 MG tablet Take 0.5-1 mg by mouth 2 (two) times daily as needed for anxiety.   Yes Historical Provider, MD  estradiol (ESTRACE) 0.5 MG tablet Take 0.5 mg by mouth daily.   Yes Historical Provider, MD  Phendimetrazine Tartrate 35 MG TABS Take 35 mg by mouth daily.   Yes Historical Provider, MD  sertraline (ZOLOFT) 100 MG tablet Take 50 mg by mouth every morning.    Yes Historical Provider, MD  baclofen (LIORESAL) 10 MG tablet Take 1 tablet (10 mg total) by mouth 3 (three) times daily. As needed for muscle spasm  12/02/13   Marchia Bond, MD  cephALEXin (KEFLEX) 500 MG capsule Take 500 mg by mouth 4 (four) times daily. 03/05/15   Historical Provider, MD  ondansetron (ZOFRAN) 4 MG tablet Take 1 tablet (4 mg total) by mouth every 8 (eight) hours as needed for nausea or vomiting. 12/02/13   Marchia Bond, MD  oxyCODONE-acetaminophen (ROXICET) 5-325 MG per tablet Take 1-2 tablets by mouth every 6 (six) hours as needed for severe pain. 12/02/13   Marchia Bond, MD  rivaroxaban (XARELTO) 10 MG TABS tablet Take 1 tablet  (10 mg total) by mouth daily. 12/02/13   Marchia Bond, MD  sennosides-docusate sodium (SENOKOT-S) 8.6-50 MG tablet Take 2 tablets by mouth daily. 12/02/13   Marchia Bond, MD    Past Medical History  Diagnosis Date  . GERD (gastroesophageal reflux disease)   . 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 Knee injury   . Depression   . Seasonal allergies     takes Zytrec  . Fibroid uterus     size of a dime  . Umbilical hernia     watching , no plans for surgery at present  . H/O hiatal hernia   . Hypertension     "went away when I stopped smoking"  . Migraine     "maybe couple times/month" (08/30/2013)  . Arthritis     "knees" (08/30/2013)  . Osteoarthritis of right knee 08/30/2013  . Anxiety   . Osteoarthritis of left knee 12/02/2013    Past Surgical History  Procedure Laterality Date  . Cesarean section  1987; 1989  . Dilation and curettage of uterus    . Replacement unicondylar joint knee Right 08/30/2013  . Tubal ligation  1989  . Vaginal hysterectomy  1990's?    "apparently took out one of my ovaries at the time too cause one's missing"  . Partial knee arthroplasty Right 08/30/2013    Procedure: RIGHT UNICOMPARTMENTAL KNEE;  Surgeon: Johnny Bridge, MD;  Location: [place];  Service: Orthopedics;  Laterality: Right;  . Colonoscopy with propofol N/A 09/19/2013    Procedure: COLONOSCOPY WITH PROPOFOL;  Surgeon: Ladene Artist, MD;  Location: [facility];  Service: Endoscopy;  Laterality: N/A;  . Partial knee arthroplasty Left 12/02/2013    Procedure: LEFT KNEE UNI ARTHROPLASTY;  Surgeon: Johnny Bridge, MD;  Location: [place];  Service: Orthopedics;  Laterality: Left;    Family history: family history includes Anemia in her sister; Heart disease (age of onset: 26) in her mother; Hypertension in her father and mother; Other in her sister; Stroke in her father. There is no history of Colon cancer.  Social History: Patient lives with her husband.  She is a former smoker.   She  reports that she quit smoking about 2 years ago. Her smoking use included Cigarettes. She has a 20 pack-year smoking history. She has never used smokeless tobacco. She reports that she does not drink alcohol or use illicit drugs.     Physical Exam: BP 104/53 mmHg  Pulse 90  Temp(Src) 97.9 F (36.6 C) (Oral)  Resp 22  Ht 5' (1.524 m)  Wt 127.461 kg (281 lb)  BMI 54.88 kg/m2  SpO2 92% General appearance:  Morbidly obese adult female, alert and in no acute distress.   Eyes: Anicteric, conjunctiva pink, lashes normal.  LEFT ptosis.  EOMI.  ENT: No nasal deformity, discharge, or epistaxis.  OP moist without lesions.  Small posterior pharynx vesicular lesion, left tonsillar pillar. Lymph:  Cervical lymphadenopathy not palpable due to habitus. Skin: Warm and dry.  No jaundice.   Cardiac: RRR, nl S1-S2, no murmurs appreciated.  Capillary refill is brisk.  No LE edema.   Respiratory: Normal respiratory rate and rhythm.  CTAB. Abdomen: Abdomen large soft without rigidity.  Mild epigastric TTP, no guarding. MSK: No deformities or effusions.  Old knee scars. Neuro: Pupils are 3 mm and reactive to 2 mm.  Extraocular movements are intact, without nystagmus.   Cranial nerve 5 is within normal limits.  Cranial nerve 7 shows LEFT ptosis.  Cranial nerve 8 is within normal limits.  Cranial nerves 9 and 10 reveal equal palate elevation.  Cranial nerve 11 reveals sternocleidomastoid strong.  Cranial nerve 12 is midline. Motor strength testing is 5/5 in the upper and lower extremities bilaterally with normal motor, tone and bulk.  Sensory examination is intact to light touch.  Romberg maneuver is negative for pathology.  Finger-to-nose testing is within normal limits. The patient is oriented to time, place and person.  Speech is fluent.  Naming is grossly intact.  Recall, recent and remote, as well as general fund of knowledge seem within normal limits.  Attention span and concentration are within normal  limits. Psych: Behavior appropriate.  Affect normal.  No evidence of aural or visual hallucinations or delusions.       Labs on Admission:  The metabolic panel shows normal sodium, potassium, bicarbonate and renal function. Transaminases and bilirubin are normal. The complete blood count shows hemoglobin and WBC normal. Troponin normal. INR normal.   Radiological Exams on Admission: Ct Head Wo Contrast 03/19/2015  IMPRESSION: Normal head CT.   Ct Soft Tissue Neck W Contrast 03/20/2015 IMPRESSION: No acute neck pathology, specifically no findings to explain patient's dysphagia. Electronically Signed   By: Anner Crete M.D.   On: 03/20/2015 01:23   Mr Brain Wo Contrast 03/20/2015   IMPRESSION: No acute intracranial process. Mild white matter changes, most often seen with chronic small vessel ischemic disease. Partially empty sella. Electronically Signed   By: Elon Alas M.D.   On: 03/20/2015 02:53    EKG: Independently reviewed. Sinus, rate 77.  QTc 433.  No STTW changes.    Assessment/Plan 1. Dysphagia:  This is new.  It seems to be a globus sensation from GERD and her hiatal hernia.  Cephalexin side effect may contribute.  The patient overuses ibuprofen.  Neurologic exam and MRI brain do not support a neurologic diagnosis.   -Consult to GI, appreciate cares -Hold oral medications until evaluated by SLP -Pantoprazole 40 mg IV BID   2. Anxiety:  -Sertraline and clonazepam are ordered, but may be held until cleared by SLP  3. Morbid obesity:  -Hold phendimetrazine until cleared by Speech  4. Allergic rhinitis:  -Hold antihistamines for now until cleared to swallow      DVT PPx: Lovenox Diet: NPO Consultants: GI Code Status: Full Family Communication: Husband, present at bedside  Medical decision making: What exists of the patient's previous chart was reviewed in depth and the case was discussed with Charlann Lange, PA-C. Patient seen 6:07 AM on  03/20/2015.  Disposition Plan:  Place in observation.  Evaluation by SLP and GI today.  Disposition pending these subspecialists, but I am optimistic that she will be ready for discharge within 24 hours.      Edwin Dada [facility] Pager [phone]

## 2015-03-20 NOTE — Progress Notes (Signed)
Frances Medina, is a 55 y.o. female, DOB - Oct 30, 1960, NP:[id number]  Admitted earlier this am for 2-3 week H/O gradually progressive dysphagia, 10 pound weight loss, CT head and soft tissue neck nonacute, symptoms appear to be secondary to achalasia. Speech has seen the patient and recommends GI input. GI already consulted Dr Collene Mares. Continue IV PPI and liquid diet. Will likely require EGD.   Filed Vitals:   03/20/15 0730 03/20/15 0820 03/20/15 0830 03/20/15 0915  BP: 116/46 120/57 113/68 112/62  Pulse: 90 77 82 88  Temp:      TempSrc:      Resp: 20 16 22 21   Height:      Weight:      SpO2: 91% 93% 94% 93%        Data Review   Micro Results No results found for this or any previous visit (from the past 240 hour(s)).  Radiology Reports Ct Head Wo Contrast  03/19/2015  CLINICAL DATA:  Dysphagia and left eye droop for 1 month. EXAM: CT HEAD WITHOUT CONTRAST TECHNIQUE: Contiguous axial images were obtained from the base of the skull through the vertex without intravenous contrast. COMPARISON:  None. FINDINGS: The ventricles are normal in size and configuration. No extra-axial fluid collections are identified. The gray-white differentiation is normal. No CT findings for acute intracranial process such as hemorrhage or infarction. No mass lesions. The brainstem and cerebellum are grossly normal. The bony structures are intact. The paranasal sinuses and mastoid air cells are clear. The globes are intact. IMPRESSION: Normal head CT. Electronically Signed   By: Marijo Sanes M.D.   On: 03/19/2015 18:28   Ct Soft Tissue Neck W Contrast  03/20/2015  CLINICAL DATA:  55 year old female with history of Bell's palsy presenting with dysphagia and difficulty swallowing. EXAM: CT NECK WITH CONTRAST TECHNIQUE: Multidetector CT imaging of the neck was performed using the standard protocol following the bolus administration of  intravenous contrast. CONTRAST:  56mL OMNIPAQUE IOHEXOL 300 MG/ML  SOLN COMPARISON:  None FINDINGS: The visualized lung apices are clear. The trachea and central airways are patent. The esophagus is grossly unremarkable. No thyroid nodule identified. The visualized carotid arteries appear patent. Minimal noncalcified plaque noted along the proximal right ICA. The parotid, and submandibular glands appear unremarkable. There is no adenopathy. There is mild degenerative changes of the spine. No acute fracture. The visualized paranasal sinuses and mastoid air cells are clear. No retropharyngeal fluid collection or abscess identified. IMPRESSION: No acute neck pathology, specifically no findings to explain patient's dysphagia. Electronically Signed   By: Anner Crete M.D.   On: 03/20/2015 01:23   Mr Brain Wo Contrast  03/20/2015  CLINICAL DATA:  Slurred speech and difficulty speaking attributed to dry mouth. History of Bell's palsy, migraine, hypertension and tumors. EXAM: MRI HEAD WITHOUT CONTRAST TECHNIQUE: Multiplanar, multiecho pulse sequences of the brain and surrounding structures were obtained without intravenous contrast. COMPARISON:  CT head January 16th 2017 FINDINGS: The ventricles and sulci are normal for patient's age. A few scattered subcentimeter supratentorial white matter FLAIR T2 hyperintensities noted. No abnormal parenchymal signal, mass lesions, mass effect. No reduced diffusion to suggest acute ischemia. No susceptibility artifact to suggest hemorrhage. No abnormal extra-axial fluid collections. No extra-axial masses though, contrast enhanced sequences would be more sensitive. Normal major intracranial vascular flow voids seen at the skull base. Ocular globes and orbital contents are unremarkable though not tailored for evaluation. Mildly expanded partial empty sella. No suspicious calvarial bone marrow signal. Craniocervical junction  maintained. Visualized paranasal sinuses and mastoid air  cells are well-aerated. IMPRESSION: No acute intracranial process. Mild white matter changes, most often seen with chronic small vessel ischemic disease. Partially empty sella. Electronically Signed   By: Elon Alas M.D.   On: 03/20/2015 02:53    CBC  Recent Labs Lab 03/19/15 1659 03/19/15 1706  WBC  --  8.7  HGB 15.3* 13.2  HCT 45.0 41.4  PLT  --  185  MCV  --  74.1*  MCH  --  23.6*  MCHC  --  31.9  RDW  --  17.0*  LYMPHSABS  --  1.9  MONOABS  --  0.3  EOSABS  --  0.0  BASOSABS  --  0.0    Chemistries   Recent Labs Lab 03/19/15 1659 03/19/15 1706  NA 142 142  K 4.4 4.4  CL 101 102  CO2  --  30  GLUCOSE 91 97  BUN 6 5*  CREATININE 0.70 0.74  CALCIUM  --  9.7  AST  --  23  ALT  --  19  ALKPHOS  --  92  BILITOT  --  0.4   ------------------------------------------------------------------------------------------------------------------ estimated creatinine clearance is 100.5 mL/min (by C-G formula based on Cr of 0.74). ------------------------------------------------------------------------------------------------------------------ No results for input(s): HGBA1C in the last 72 hours. ------------------------------------------------------------------------------------------------------------------ No results for input(s): CHOL, HDL, LDLCALC, TRIG, CHOLHDL, LDLDIRECT in the last 72 hours. ------------------------------------------------------------------------------------------------------------------ No results for input(s): TSH, T4TOTAL, T3FREE, THYROIDAB in the last 72 hours.  Invalid input(s): FREET3 ------------------------------------------------------------------------------------------------------------------ No results for input(s): VITAMINB12, FOLATE, FERRITIN, TIBC, IRON, RETICCTPCT in the last 72 hours.  Coagulation profile  Recent Labs Lab 03/19/15 1706  INR 1.06    No results for input(s): DDIMER in the last 72 hours.  Cardiac  Enzymes No results for input(s): CKMB, TROPONINI, MYOGLOBIN in the last 168 hours.  Invalid input(s): CK ------------------------------------------------------------------------------------------------------------------ Invalid input(s): POCBNP

## 2015-03-20 NOTE — ED Notes (Signed)
Vital signs stable. 

## 2015-03-20 NOTE — Consult Note (Signed)
[facility] Consult: 3:38 PM 03/20/2015  LOS: 0 days    Referring Provider: Dr Candiss Norse  Primary Care Physician:  Colin Benton R., DO Dr.Avubuere.  Primary Gastroenterologist:  Dr. Fuller Plan     Reason for Consultation:  Dysphagia   HPI: Frances Medina is a 55 y.o. female.  Past medical history of morbid obesity. Anxiety on clonazepam. Degenerative joint disease for which she is s/p bilateral partial knee replacement. Status post multiple pelvic surgeries including tubal ligation, hysterectomy, C-section. Diagnosed with Bell's Palsy in 02/02/2015 and still has ptosis in her left eye.. Currently on a course of Keflex for sinusitis. This has led to a vaginal yeast infection.  08/2013 Colonoscopy: avg risk screening. 4 sigmoid polyps 5 to 6 mm sized. 1 was TA, 3 were HP polyps.   About 10 days ago she developed dysphagia to solids. He quickly morph to dysphagia to liquids and for the past 3 or 4 days she has been unable to swallow her saliva. The location of where the food feels like it's getting stuck is at the base of the neck in the cervical esophagus. She has a long history, at least a few years, of GERD-like symptoms and regularly has problems with postprandial vomiting, that is not associated with nausea. She has tried acid suppressing medications but these do not seem to work. What works for her is baking soda mixed with water. She drinks this after eating and is able to belch. The food seems to move on and she doesn't have to vomit.  Though she has never had EGD, she says she has a hiatal hernia. She takes 1000 mg Advil daily for joint pain and headaches. Patient's weight has been steady at about 291 but in the last few days she has dropped 10 pounds.  LFTs and chemistries normal. MCV 74, Hgb 13.2. SLP bedside eval:  primary esophageal dysphagia. Rec full liquid diet and GI eval.  Imaging includes CT of the neck and head, both of which are unremarkable   Past Medical History  Diagnosis Date  . GERD (gastroesophageal reflux disease)   . 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 Knee injury   . Depression   . Seasonal allergies     takes Zytrec  . Fibroid uterus     size of a dime  . Umbilical hernia     watching , no plans for surgery at present  . H/O hiatal hernia   . Hypertension     "went away when I stopped smoking"  . Migraine     "maybe couple times/month" (08/30/2013)  . Arthritis     "knees" (08/30/2013)  . Osteoarthritis of right knee 08/30/2013  . Anxiety   . Osteoarthritis of left knee 12/02/2013    Past Surgical History  Procedure Laterality Date  . Cesarean section  1987; 1989  . Dilation and curettage of uterus    . Replacement unicondylar joint knee Right 08/30/2013  . Tubal ligation  1989  . Vaginal hysterectomy  1990's?    "apparently took out  one of my ovaries at the time too cause one's missing"  . Partial knee arthroplasty Right 08/30/2013    Procedure: RIGHT UNICOMPARTMENTAL KNEE;  Surgeon: Johnny Bridge, MD;  Location: [facility];  Service: Orthopedics;  Laterality: Right;  . Colonoscopy with propofol N/A 09/19/2013    Procedure: COLONOSCOPY WITH PROPOFOL;  Surgeon: Ladene Artist, MD;  Location: [facility];  Service: Endoscopy;  Laterality: N/A;  . Partial knee arthroplasty Left 12/02/2013    Procedure: LEFT KNEE UNI ARTHROPLASTY;  Surgeon: Johnny Bridge, MD;  Location: [facility];  Service: Orthopedics;  Laterality: Left;    Prior to Admission medications   Medication Sig Start Date End Date Taking? Authorizing Provider  cetirizine (ZYRTEC) 10 MG tablet Take 10 mg by mouth daily as needed for allergies.    Yes Historical Provider, MD  Chlorphen-PE-Acetaminophen (NOREL AD) 4-10-325 MG TABS Take 1 tablet by mouth 2 (two) times daily as needed (FOR SINUSES).   Yes  Historical Provider, MD  clonazePAM (KLONOPIN) 1 MG tablet Take 0.5-1 mg by mouth 2 (two) times daily as needed for anxiety.   Yes Historical Provider, MD  estradiol (ESTRACE) 0.5 MG tablet Take 0.5 mg by mouth daily.   Yes Historical Provider, MD  Phendimetrazine Tartrate 35 MG TABS Take 35 mg by mouth daily.   Yes Historical Provider, MD  sertraline (ZOLOFT) 100 MG tablet Take 50 mg by mouth every morning.    Yes Historical Provider, MD    Scheduled Meds: . enoxaparin (LOVENOX) injection  40 mg Subcutaneous Q24H  . estradiol  0.5 mg Oral Daily  . fluticasone  2 spray Each Nare Daily  . pantoprazole (PROTONIX) IV  40 mg Intravenous Q12H  . sertraline  50 mg Oral q morning - 10a   Infusions: . sodium chloride 75 mL/hr at 03/20/15 1339   PRN Meds: clonazePAM   Allergies as of 03/19/2015 - Review Complete 03/19/2015  Allergen Reaction Noted  . Dicyclomine Hives 01/14/2011  . Methocarbamol Hives 09/19/2013  . Penicillins Hives 08/18/2013    Family History  Problem Relation Age of Onset  . Heart disease Mother 26  . Hypertension Mother   . Stroke Father   . Hypertension Father   . Colon cancer Neg Hx   . Other Sister     Esophageal strictures  . Anemia Sister     Social History   Social History  . Marital Status: Married    Spouse Name: N/A  . Number of Children: N/A  . Years of Education: N/A   Occupational History  . Not on file.   Social History Main Topics  . Smoking status: Former Smoker -- 0.50 packs/day for 40 years    Types: Cigarettes    Quit date: 05/14/2012  . Smokeless tobacco: Never Used  . Alcohol Use: No  . Drug Use: No  . Sexual Activity: Yes    Birth Control/ Protection: Surgical   Other Topics Concern  . Not on file   Social History Narrative   Work or School: [employer] Situation: lives with husband and daughters      Spiritual Beliefs: Christian      Lifestyle: no regular exercise; trying to eat  healthy             REVIEW OF SYSTEMS: Constitutional:  Pleasant, anxious ENT:  No nose bleeds.  She is unable to blow her nose since she developed the Bell's palsy, as  a consequent nasal drainage is passing down the throat and causing throat irritation Pulm:  No cough, no shortness of breath. CV:  No palpitations, no LE edema. No chest pain GU:  No hematuria, no frequency GI:  Per HPI.   Heme:  No anemia or hx iron supplements.  No bleeding excess.    Transfusions:  none Neuro: Suffers from headaches. Bell's palsy has affected the left eye which has a eyelid droop. Derm:  No itching, no rash or sores.  Endocrine:  No sweats or chills.  No polyuria or dysuria Immunization:  Did not inquire Travel:  None beyond local counties in last few months.    PHYSICAL EXAM: Vital signs in last 24 hours: Filed Vitals:   03/20/15 1400 03/20/15 1445  BP: 103/85   Pulse: 97 87  Temp:    Resp:     Wt Readings from Last 3 Encounters:  03/19/15 127.461 kg (281 lb)  03/16/14 129.729 kg (286 lb)  12/02/13 131.09 kg (289 lb)    General: Pleasant, morbidly obese, somewhat anxious female appearing her stated age Head:  No facial swelling. No signs of head trauma.  Eyes:  Left ptosis.  EOMI. Ears:  Not hard of hearing  Nose:  No congestion Mouth:  Moist, clear oral mucosa. Tongue midline. Symmetric smile.2 Neck:  No mass, no thyromegaly. No JVD. Lungs:  No labored breathing or cough. Lungs clear bilaterally. Heart: RRR. No MRG. S1/S2 audible. Abdomen:  Obese, soft, not tender. Bowel sounds active. No organomegaly appreciated. No bruits.   Rectal: Deferred   Musc/Skeltl: Healed scars on both knees. Extremities:  No CCE.  Neurologic:  Alert. Oriented 3. Moves all 4 limbs.  Equal and strong shoulder lift strength. Motor testing 5 over 5 in upper and lower extremities. Skin:  No rash, no sores. No telangiectasia   Psych:  Pleasant, cooperative, somewhat anxious.   LAB RESULTS:  Recent  Labs  03/19/15 1659 03/19/15 1706  WBC  --  8.7  HGB 15.3* 13.2  HCT 45.0 41.4  PLT  --  185   BMET Lab Results  Component Value Date   NA 142 03/19/2015   NA 142 03/19/2015   NA 143 11/30/2013   K 4.4 03/19/2015   K 4.4 03/19/2015   K 4.8 11/30/2013   CL 102 03/19/2015   CL 101 03/19/2015   CL 103 11/30/2013   CO2 30 03/19/2015   CO2 26 11/30/2013   CO2 25 08/31/2013   GLUCOSE 97 03/19/2015   GLUCOSE 91 03/19/2015   GLUCOSE 94 11/30/2013   BUN 5* 03/19/2015   BUN 6 03/19/2015   BUN 9 11/30/2013   CREATININE 0.74 03/19/2015   CREATININE 0.70 03/19/2015   CREATININE 0.67 11/30/2013   CALCIUM 9.7 03/19/2015   CALCIUM 9.4 11/30/2013   CALCIUM 9.3 08/31/2013   LFT  Recent Labs  03/19/15 1706  PROT 7.3  ALBUMIN 3.8  AST 23  ALT 19  ALKPHOS 92  BILITOT 0.4   PT/INR Lab Results  Component Value Date   INR 1.06 03/19/2015   INR 0.90 08/19/2013   Lipase     Component Value Date/Time   LIPASE 15 11/21/2008 2220    Drugs of Abuse  No results found for: LABOPIA, COCAINSCRNUR, LABBENZ, AMPHETMU, THCU, LABBARB   RADIOLOGY STUDIES: Ct Head Wo Contrast  03/19/2015  CLINICAL DATA:  Dysphagia and left eye droop for 1 month. EXAM: CT HEAD WITHOUT CONTRAST TECHNIQUE: Contiguous axial images were obtained from the base of  the skull through the vertex without intravenous contrast. COMPARISON:  None. FINDINGS: The ventricles are normal in size and configuration. No extra-axial fluid collections are identified. The gray-white differentiation is normal. No CT findings for acute intracranial process such as hemorrhage or infarction. No mass lesions. The brainstem and cerebellum are grossly normal. The bony structures are intact. The paranasal sinuses and mastoid air cells are clear. The globes are intact. IMPRESSION: Normal head CT. Electronically Signed   By: Marijo Sanes M.D.   On: 03/19/2015 18:28   Ct Soft Tissue Neck W Contrast  03/20/2015  CLINICAL DATA:  55 year old  female with history of Bell's palsy presenting with dysphagia and difficulty swallowing. EXAM: CT NECK WITH CONTRAST TECHNIQUE: Multidetector CT imaging of the neck was performed using the standard protocol following the bolus administration of intravenous contrast. CONTRAST:  83mL OMNIPAQUE IOHEXOL 300 MG/ML  SOLN COMPARISON:  None FINDINGS: The visualized lung apices are clear. The trachea and central airways are patent. The esophagus is grossly unremarkable. No thyroid nodule identified. The visualized carotid arteries appear patent. Minimal noncalcified plaque noted along the proximal right ICA. The parotid, and submandibular glands appear unremarkable. There is no adenopathy. There is mild degenerative changes of the spine. No acute fracture. The visualized paranasal sinuses and mastoid air cells are clear. No retropharyngeal fluid collection or abscess identified. IMPRESSION: No acute neck pathology, specifically no findings to explain patient's dysphagia. Electronically Signed   By: Anner Crete M.D.   On: 03/20/2015 01:23   Mr Brain Wo Contrast  03/20/2015  CLINICAL DATA:  Slurred speech and difficulty speaking attributed to dry mouth. History of Bell's palsy, migraine, hypertension and tumors. EXAM: MRI HEAD WITHOUT CONTRAST TECHNIQUE: Multiplanar, multiecho pulse sequences of the brain and surrounding structures were obtained without intravenous contrast. COMPARISON:  CT head January 16th 2017 FINDINGS: The ventricles and sulci are normal for patient's age. A few scattered subcentimeter supratentorial white matter FLAIR T2 hyperintensities noted. No abnormal parenchymal signal, mass lesions, mass effect. No reduced diffusion to suggest acute ischemia. No susceptibility artifact to suggest hemorrhage. No abnormal extra-axial fluid collections. No extra-axial masses though, contrast enhanced sequences would be more sensitive. Normal major intracranial vascular flow voids seen at the skull base. Ocular  globes and orbital contents are unremarkable though not tailored for evaluation. Mildly expanded partial empty sella. No suspicious calvarial bone marrow signal. Craniocervical junction maintained. Visualized paranasal sinuses and mastoid air cells are well-aerated. IMPRESSION: No acute intracranial process. Mild white matter changes, most often seen with chronic small vessel ischemic disease. Partially empty sella. Electronically Signed   By: Elon Alas M.D.   On: 03/20/2015 02:53    ENDOSCOPIC STUDIES: See HPI  IMPRESSION:   *  Dysphagia.  Solid and liquid with difficulty handling secretions. Diagnosed with Bell's palsy, left ptosis, 02/02/15.  Wonder if there is additional cranial nerve injury? though by exam glossopharyngeal (gag) and hypoglossal (tongue symmetric on exam) are intact.  ? Occult food impaction? Long-standing history of what sounds like regurgitation and GERD.  Patient claims to have hiatal hernia though do not see imaging which supports this diagnosis.  Has never undergone upper endoscopy.  *  Microcytosis without anemia.  *  History adenomatous and hyperplastic polyps on screening colonoscopy 08/2013.   PLAN:     *  Question EGD versus esophagram.     Jamese Colella  03/20/2015, 3:38 PM Pager: [place] Attending   I have taken an interval  history, reviewed the chart and examined the patient. I agree with the Advanced Practitioner's note, impression and recommendations.    She has progressively worsening and severe dysphagia. ? From GER, ? Infection, stricture.  W/u so far unrevealing. Doubt related to Bell's palsy thouggh may affect nn that supply pharynx.  Plan for EGD, possible dilation tomorrow. The risks and benefits as well as alternatives of endoscopic procedure(s) have been discussed and reviewed. All questions answered. The patient agrees to proceed.  Gatha Mayer, MD, [facility] [phone]  (pager) [phone] after 5 PM, weekends and holidays  03/20/2015 6:12 PM

## 2015-03-20 NOTE — Evaluation (Signed)
Clinical/Bedside Swallow Evaluation Patient Details  Name: Frances Medina MRN: [id number] Date of Birth: Mar 29, 1960  Today's Date: 03/20/2015 Time: SLP Start Time (ACUTE ONLY): 50 SLP Stop Time (ACUTE ONLY): 1057 SLP Time Calculation (min) (ACUTE ONLY): 14 min  Past Medical History:  Past Medical History  Diagnosis Date  . GERD (gastroesophageal reflux disease)   . 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 Knee injury   . Depression   . Seasonal allergies     takes Zytrec  . Fibroid uterus     size of a dime  . Umbilical hernia     watching , no plans for surgery at present  . H/O hiatal hernia   . Hypertension     "went away when I stopped smoking"  . Migraine     "maybe couple times/month" (08/30/2013)  . Arthritis     "knees" (08/30/2013)  . Osteoarthritis of right knee 08/30/2013  . Anxiety   . Osteoarthritis of left knee 12/02/2013   Past Surgical History:  Past Surgical History  Procedure Laterality Date  . Cesarean section  1987; 1989  . Dilation and curettage of uterus    . Replacement unicondylar joint knee Right 08/30/2013  . Tubal ligation  1989  . Vaginal hysterectomy  1990's?    "apparently took out one of my ovaries at the time too cause one's missing"  . Partial knee arthroplasty Right 08/30/2013    Procedure: RIGHT UNICOMPARTMENTAL KNEE;  Surgeon: Johnny Bridge, MD;  Location: [facility];  Service: Orthopedics;  Laterality: Right;  . Colonoscopy with propofol N/A 09/19/2013    Procedure: COLONOSCOPY WITH PROPOFOL;  Surgeon: Ladene Artist, MD;  Location: [facility];  Service: Endoscopy;  Laterality: N/A;  . Partial knee arthroplasty Left 12/02/2013    Procedure: LEFT KNEE UNI ARTHROPLASTY;  Surgeon: Johnny Bridge, MD;  Location: [facility];  Service: Orthopedics;  Laterality: Left;   HPI:  Frances Medina is a 55 y.o. female with a past medical history significant for MO, hiatal hernia, anxiety and recent Bell's palsy who presents with difficulty  swallowing and the sensation of something in her throat for 1 week.   Assessment / Plan / Recommendation Clinical Impression  Pt's reported and observed symptoms are concerning for a primary esophageal dysphagia. She has increased difficulty with solids over liquids, globus sensation, and the feeling of needing to belch with all intake. Multiple swallows and grunt-like sounds during swallow appear to be related to the aforementioned sensations. Recommend to initiate a full liquid diet and to consider GI consult while in house. SLP to f/u briefly while etiology of dysphagia is further assessed. Esophageal precautions reviewed with patient.    Aspiration Risk  Mild aspiration risk    Diet Recommendation Thin liquid (full liquid diet)  Liquid Administration via: Cup;Straw Medication Administration: Crushed with puree Supervision: Patient able to self feed;Intermittent supervision to cue for compensatory strategies Compensations: Slow rate;Small sips/bites;Follow solids with liquid Postural Changes: Seated upright at 90 degrees;Remain upright for at least 30 minutes after po intake    Other  Recommendations Recommended Consults: Consider GI evaluation;Consider esophageal assessment Oral Care Recommendations: Oral care BID   Follow up Recommendations  None    Frequency and Duration min 1 x/week  1 week       Prognosis Prognosis for Safe Diet Advancement: Good Barriers to Reach Goals: Other (Comment) (suspect primary esophageal source)      Swallow Study   General Date  of Onset:  (over the past week prior to admission) HPI: Frances Medina is a 55 y.o. female with a past medical history significant for MO, hiatal hernia, anxiety and recent Bell's palsy who presents with difficulty swallowing and the sensation of something in her throat for 1 week. Type of Study: Bedside Swallow Evaluation Previous Swallow Assessment: none in chart Diet Prior to this Study: NPO Temperature Spikes Noted:  No Respiratory Status: Room air History of Recent Intubation: No Behavior/Cognition: Alert;Cooperative;Pleasant mood Oral Cavity Assessment: Within Functional Limits (appears WFL, but pt with c/o dryness) Oral Care Completed by SLP: No Oral Cavity - Dentition: Adequate natural dentition Vision: Functional for self-feeding Self-Feeding Abilities: Able to feed self Patient Positioning: Upright in bed Baseline Vocal Quality: Normal Volitional Cough: Strong Volitional Swallow: Able to elicit    Oral/Motor/Sensory Function Overall Oral Motor/Sensory Function: Other (comment) (pt has c/o generalized weakness)   Ice Chips Ice chips: Not tested   Thin Liquid Thin Liquid: Impaired Presentation: Cup;Self Fed;Straw Pharyngeal  Phase Impairments: Multiple swallows    Nectar Thick Nectar Thick Liquid: Not tested   Honey Thick Honey Thick Liquid: Not tested   Puree Puree: Impaired Presentation: Self Fed;Spoon Pharyngeal Phase Impairments: Multiple swallows   Solid   GO   Solid: Not tested    Functional Assessment Tool Used: skilled clinical judgment Functional Limitations: Swallowing Swallow Current Status [id number]): At least 40 percent but less than 60 percent impaired, limited or restricted Swallow Goal Status [phone]): At least 20 percent but less than 40 percent impaired, limited or restricted  Germain Osgood, M.A. CCC-SLP [phone]  Germain Osgood 03/20/2015,11:09 AM

## 2015-03-20 NOTE — ED Provider Notes (Signed)
Patient signed out at end of shift by Ottie Glazier, PA-C, with MRI brain pending for evaluation of possible CVA. She was diagnosed with Bell's Palsy one month ago by PCP and continues to have symptoms affecting left periorbital area. She presents to the emergency room with increasing dysphagia that started 2 days ago with increased difficulty swallowing solids. This progressed to difficulty swallowing liquids. She reports being unable to eat or drink over the last 24 hours. The patient has been evaluated with CT scan neck and head and MR brain to r/o neurologic cause of dysphagia - all are negative. She fails a swallow screen in the ED.   The patient's condition was discussed with neurology, Dr. Nicole Kindred, who felt there was no neurologic concern. Discussed with Dr. Collene Mares (GI) who will consult on the patient in hospital. Patient to be admitted to [facility], Dr. Loleta Books accepting.   Charlann Lange, PA-C 03/20/15 [id number]  Merryl Hacker, MD 03/20/15 [phone]

## 2015-03-20 NOTE — Progress Notes (Signed)
Admission RN called

## 2015-03-20 NOTE — Progress Notes (Signed)
New Admission Note:  Arrival Method:Ed stretcher Mental Orientation: alert and oriented Telemetry: none Assessment: Completed Skin: intact IV: left Ac Pain: 7 Tubes:none Safety Measures: Safety Fall Prevention Plan was given, discussed and signed. Admission: Completed [facility] Orientation: Patient has been orientated to the room, unit and the staff. Family:present  Orders have been reviewed and implemented. Will continue to monitor the patient. Call light has been placed within reach and bed alarm has been activated.   Fabian Sharp, RN  Phone Number: [phone]

## 2015-03-20 NOTE — ED Notes (Signed)
Attempted to call report. RN to call back. 

## 2015-03-21 ENCOUNTER — Observation Stay (HOSPITAL_COMMUNITY): Payer: BC Managed Care – PPO | Admitting: Certified Registered Nurse Anesthetist

## 2015-03-21 ENCOUNTER — Encounter (HOSPITAL_COMMUNITY)
Admission: EM | Disposition: A | Discharge status: Home Health | Payer: Self-pay | Source: Home / Self Care | Attending: Internal Medicine

## 2015-03-21 ENCOUNTER — Encounter (HOSPITAL_COMMUNITY): Payer: Self-pay | Admitting: Certified Registered Nurse Anesthetist

## 2015-03-21 DIAGNOSIS — E669 Obesity, unspecified: Secondary | ICD-10-CM | POA: Diagnosis not present

## 2015-03-21 DIAGNOSIS — R130 Aphagia: Secondary | ICD-10-CM | POA: Diagnosis not present

## 2015-03-21 DIAGNOSIS — G7000 Myasthenia gravis without (acute) exacerbation: Secondary | ICD-10-CM | POA: Diagnosis not present

## 2015-03-21 DIAGNOSIS — R1310 Dysphagia, unspecified: Secondary | ICD-10-CM | POA: Diagnosis not present

## 2015-03-21 DIAGNOSIS — K219 Gastro-esophageal reflux disease without esophagitis: Secondary | ICD-10-CM

## 2015-03-21 HISTORY — PX: ESOPHAGOGASTRODUODENOSCOPY (EGD) WITH PROPOFOL: SHX5813

## 2015-03-21 LAB — COMPREHENSIVE METABOLIC PANEL
ALT: 15 U/L (ref 14–54)
AST: 21 U/L (ref 15–41)
Albumin: 3.3 g/dL — ABNORMAL LOW (ref 3.5–5.0)
Alkaline Phosphatase: 75 U/L (ref 38–126)
Anion gap: 8 (ref 5–15)
BUN: 10 mg/dL (ref 6–20)
CO2: 26 mmol/L (ref 22–32)
Calcium: 8.7 mg/dL — ABNORMAL LOW (ref 8.9–10.3)
Chloride: 106 mmol/L (ref 101–111)
Creatinine, Ser: 0.65 mg/dL (ref 0.44–1.00)
GFR calc Af Amer: >60 mL/min (ref >60)
GFR calc non Af Amer: >60 mL/min (ref >60)
Glucose, Bld: 83 mg/dL (ref 65–99)
Potassium: 4 mmol/L (ref 3.5–5.1)
Sodium: 140 mmol/L (ref 135–145)
Total Bilirubin: 0.7 mg/dL (ref 0.3–1.2)
Total Protein: 6.4 g/dL — ABNORMAL LOW (ref 6.5–8.1)

## 2015-03-21 LAB — CBC
HCT: 38 % (ref 36.0–46.0)
Hemoglobin: 12 g/dL (ref 12.0–15.0)
MCH: 23.4 pg — ABNORMAL LOW (ref 26.0–34.0)
MCHC: 31.6 g/dL (ref 30.0–36.0)
MCV: 74.2 fL — ABNORMAL LOW (ref 78.0–100.0)
Platelets: 156 10*3/uL (ref 150–400)
RBC: 5.12 MIL/uL — ABNORMAL HIGH (ref 3.87–5.11)
RDW: 17 % — ABNORMAL HIGH (ref 11.5–15.5)
WBC: 6 10*3/uL (ref 4.0–10.5)

## 2015-03-21 LAB — GLUCOSE, CAPILLARY
Glucose-Capillary: 77 mg/dL (ref 65–99)
Glucose-Capillary: 79 mg/dL (ref 65–99)

## 2015-03-21 SURGERY — ESOPHAGOGASTRODUODENOSCOPY (EGD) WITH PROPOFOL
Anesthesia: Monitor Anesthesia Care

## 2015-03-21 MED ORDER — PANTOPRAZOLE SODIUM 40 MG PO TBEC
40.0000 mg | DELAYED_RELEASE_TABLET | Freq: Every day | ORAL | Status: DC
  Administered 2015-03-22 – 2015-03-27 (×6): 40 mg via ORAL
  Filled 2015-03-21 (×6): qty 1

## 2015-03-21 MED ORDER — PROPOFOL 500 MG/50ML IV EMUL
INTRAVENOUS | Status: DC | PRN
  Administered 2015-03-21: 75 ug/kg/min via INTRAVENOUS

## 2015-03-21 MED ORDER — LACTATED RINGERS IV SOLN
INTRAVENOUS | Status: DC | PRN
  Administered 2015-03-21: 13:00:00 via INTRAVENOUS

## 2015-03-21 MED ORDER — ONDANSETRON HCL 4 MG/2ML IJ SOLN
INTRAMUSCULAR | Status: DC | PRN
  Administered 2015-03-21: 4 mg via INTRAVENOUS

## 2015-03-21 MED ORDER — BUTAMBEN-TETRACAINE-BENZOCAINE 2-2-14 % EX AERO
INHALATION_SPRAY | CUTANEOUS | Status: DC | PRN
  Administered 2015-03-21: 2 via TOPICAL

## 2015-03-21 MED ORDER — MIDAZOLAM HCL 2 MG/2ML IJ SOLN
INTRAMUSCULAR | Status: DC | PRN
  Administered 2015-03-21: 2 mg via INTRAVENOUS

## 2015-03-21 MED ORDER — GLYCOPYRROLATE 0.2 MG/ML IJ SOLN
0.1000 mg | Freq: Three times a day (TID) | INTRAMUSCULAR | Status: DC | PRN
  Administered 2015-03-21 – 2015-03-26 (×5): 0.1 mg via SUBCUTANEOUS
  Filled 2015-03-21 (×9): qty 0.5

## 2015-03-21 MED ORDER — LIDOCAINE HCL (CARDIAC) 20 MG/ML IV SOLN
INTRAVENOUS | Status: DC | PRN
  Administered 2015-03-21: 80 mg via INTRAVENOUS

## 2015-03-21 MED ORDER — ENOXAPARIN SODIUM 80 MG/0.8ML ~~LOC~~ SOLN
0.5000 mg/kg | SUBCUTANEOUS | Status: DC
  Administered 2015-03-21 – 2015-03-26 (×6): 65 mg via SUBCUTANEOUS
  Filled 2015-03-21 (×6): qty 0.8

## 2015-03-21 NOTE — Anesthesia Postprocedure Evaluation (Signed)
Anesthesia Post Note  Patient: Frances Medina  Procedure(s) Performed: Procedure(s) (LRB): ESOPHAGOGASTRODUODENOSCOPY (EGD) WITH PROPOFOL (N/A)  Patient location during evaluation: Endoscopy Anesthesia Type: MAC Level of consciousness: awake and awake and alert Pain management: pain level controlled Vital Signs Assessment: post-procedure vital signs reviewed and stable Respiratory status: spontaneous breathing and nonlabored ventilation Anesthetic complications: no    Last Vitals:  Filed Vitals:   03/21/15 1400 03/21/15 1405  BP: 132/101 153/84  Pulse: 95 100  Temp:    Resp: 17 12    Last Pain:  Filed Vitals:   03/21/15 1407  PainSc: 0-No pain                 Meggan Dhaliwal COKER

## 2015-03-21 NOTE — Anesthesia Preprocedure Evaluation (Signed)
Anesthesia Evaluation  Patient identified by MRN, date of birth, ID band Patient awake    Reviewed: Allergy & Precautions, NPO status , Patient's Chart, lab work & pertinent test results  Airway Mallampati: II  TM Distance: >3 FB Neck ROM: Full    Dental  (+) Teeth Intact, Dental Advisory Given   Pulmonary former smoker,    breath sounds clear to auscultation       Cardiovascular hypertension,  Rhythm:Regular Rate:Normal     Neuro/Psych    GI/Hepatic   Endo/Other    Renal/GU      Musculoskeletal   Abdominal (+) + obese,   Peds  Hematology   Anesthesia Other Findings   Reproductive/Obstetrics                             Anesthesia Physical Anesthesia Plan  ASA: III  Anesthesia Plan: MAC   Post-op Pain Management:    Induction: Intravenous  Airway Management Planned: Natural Airway and Simple Face Mask  Additional Equipment:   Intra-op Plan:   Post-operative Plan:   Informed Consent: I have reviewed the patients History and Physical, chart, labs and discussed the procedure including the risks, benefits and alternatives for the proposed anesthesia with the patient or authorized representative who has indicated his/her understanding and acceptance.   Dental advisory given  Plan Discussed with: CRNA and Anesthesiologist  Anesthesia Plan Comments: (Dysphagia Obesity Anxiety  Plan MAC  Frances Medina)        Anesthesia Quick Evaluation

## 2015-03-21 NOTE — Progress Notes (Signed)
[facility] PROGRESS NOTE  Frances Medina [id number] DOB: [birth year] DOA: 03/19/2015 PCP: [name]., DO  HPI 55 y.o. female with a past medical history significant for morbid obesity, hiatal hernia, anxiety and recent Bell's palsy who presented with dysphagia  HPI/Subjective Continues to report difficulty swallowing and sensation of something in her throat. Denies chest pain, dyspnea or abdominal pain.  Assessment/Plan: 1. New onset Dysphagia with history of GERD: May be related to GERD related stricture. MRI brain does not support a neurologic diagnosis. Hold PO medications while NPO. Continue IV Pantoprazole. Endoscopy planned for later today. GI input appreciated.  2. Anxiety: Hold Sertraline and clonazepam until cleared by SLP after endoscopy  3. Morbid obesity: Hold phendimetrazine as above  4. Allergic rhinitis: Hold Zyrtec as above  Code Status: FULL Family Communication: Family member at bedside  Disposition Plan: likely home pending further workup.   Consultants:  GI  Procedures:  EGD planned for today  Antibiotics:  None    Objective: Filed Vitals:   03/20/15 2258 03/21/15 0538  BP: 121/46 120/61  Pulse: 88 79  Temp: 98.4 F (36.9 C) 98 F (36.7 C)  Resp: 20 24    Intake/Output Summary (Last 24 hours) at 03/21/15 0949 Last data filed at 03/21/15 0837  Gross per 24 hour  Intake      0 ml  Output      0 ml  Net      0 ml   Filed Weights   03/19/15 1629  Weight: 127.461 kg (281 lb)    Exam:   General:  Alert, NAD  Cardiovascular: S1-S2 regular  Respiratory: no accessory muscle use  Abdomen: obese, nontender  Musculoskeletal:   Data Reviewed: Basic Metabolic Panel:  Recent Labs Lab 03/19/15 1659 03/19/15 1706 03/20/15 1820 03/21/15 0615  NA 142 142  --  140  K 4.4 4.4  --  4.0  CL 101 102  --  106  CO2  --  30  --  26  GLUCOSE 91 97  --  83  BUN 6 5*  --  10  CREATININE 0.70 0.74 0.69 0.65  CALCIUM  --   9.7  --  8.7*   Liver Function Tests:  Recent Labs Lab 03/19/15 1706 03/21/15 0615  AST 23 21  ALT 19 15  ALKPHOS 92 75  BILITOT 0.4 0.7  PROT 7.3 6.4*  ALBUMIN 3.8 3.3*   No results for input(s): LIPASE, AMYLASE in the last 168 hours. No results for input(s): AMMONIA in the last 168 hours. CBC:  Recent Labs Lab 03/19/15 1659 03/19/15 1706 03/20/15 1820 03/21/15 0615  WBC  --  8.7 8.1 6.0  NEUTROABS  --  6.4  --   --   HGB 15.3* 13.2 12.6 12.0  HCT 45.0 41.4 39.3 38.0  MCV  --  74.1* 73.6* 74.2*  PLT  --  185 168 156   Cardiac Enzymes: No results for input(s): CKTOTAL, CKMB, CKMBINDEX, TROPONINI in the last 168 hours. BNP (last 3 results) No results for input(s): BNP in the last 8760 hours.  ProBNP (last 3 results) No results for input(s): PROBNP in the last 8760 hours.  CBG:  Recent Labs Lab 03/19/15 2331  GLUCAP 77    No results found for this or any previous visit (from the past 240 hour(s)).   Studies: Ct Head Wo Contrast  03/19/2015  CLINICAL DATA:  Dysphagia and left eye droop for 1 month. EXAM: CT HEAD WITHOUT CONTRAST TECHNIQUE: Contiguous  axial images were obtained from the base of the skull through the vertex without intravenous contrast. COMPARISON:  None. FINDINGS: The ventricles are normal in size and configuration. No extra-axial fluid collections are identified. The gray-white differentiation is normal. No CT findings for acute intracranial process such as hemorrhage or infarction. No mass lesions. The brainstem and cerebellum are grossly normal. The bony structures are intact. The paranasal sinuses and mastoid air cells are clear. The globes are intact. IMPRESSION: Normal head CT. Electronically Signed   By: Marijo Sanes M.D.   On: 03/19/2015 18:28   Ct Soft Tissue Neck W Contrast  03/20/2015  CLINICAL DATA:  55 year old female with history of Bell's palsy presenting with dysphagia and difficulty swallowing. EXAM: CT NECK WITH CONTRAST  TECHNIQUE: Multidetector CT imaging of the neck was performed using the standard protocol following the bolus administration of intravenous contrast. CONTRAST:  3mL OMNIPAQUE IOHEXOL 300 MG/ML  SOLN COMPARISON:  None FINDINGS: The visualized lung apices are clear. The trachea and central airways are patent. The esophagus is grossly unremarkable. No thyroid nodule identified. The visualized carotid arteries appear patent. Minimal noncalcified plaque noted along the proximal right ICA. The parotid, and submandibular glands appear unremarkable. There is no adenopathy. There is mild degenerative changes of the spine. No acute fracture. The visualized paranasal sinuses and mastoid air cells are clear. No retropharyngeal fluid collection or abscess identified. IMPRESSION: No acute neck pathology, specifically no findings to explain patient's dysphagia. Electronically Signed   By: Anner Crete M.D.   On: 03/20/2015 01:23   Mr Brain Wo Contrast  03/20/2015  CLINICAL DATA:  Slurred speech and difficulty speaking attributed to dry mouth. History of Bell's palsy, migraine, hypertension and tumors. EXAM: MRI HEAD WITHOUT CONTRAST TECHNIQUE: Multiplanar, multiecho pulse sequences of the brain and surrounding structures were obtained without intravenous contrast. COMPARISON:  CT head January 16th 2017 FINDINGS: The ventricles and sulci are normal for patient's age. A few scattered subcentimeter supratentorial white matter FLAIR T2 hyperintensities noted. No abnormal parenchymal signal, mass lesions, mass effect. No reduced diffusion to suggest acute ischemia. No susceptibility artifact to suggest hemorrhage. No abnormal extra-axial fluid collections. No extra-axial masses though, contrast enhanced sequences would be more sensitive. Normal major intracranial vascular flow voids seen at the skull base. Ocular globes and orbital contents are unremarkable though not tailored for evaluation. Mildly expanded partial empty  sella. No suspicious calvarial bone marrow signal. Craniocervical junction maintained. Visualized paranasal sinuses and mastoid air cells are well-aerated. IMPRESSION: No acute intracranial process. Mild white matter changes, most often seen with chronic small vessel ischemic disease. Partially empty sella. Electronically Signed   By: Elon Alas M.D.   On: 03/20/2015 02:53    Scheduled Meds: . enoxaparin (LOVENOX) injection  0.5 mg/kg Subcutaneous Q24H  . estradiol  0.5 mg Oral Daily  . fluticasone  2 spray Each Nare Daily  . Influenza vac split quadrivalent PF  0.5 mL Intramuscular Tomorrow-1000  . pantoprazole (PROTONIX) IV  40 mg Intravenous Q12H  . sertraline  50 mg Oral q morning - 10a   Continuous Infusions: . sodium chloride 75 mL/hr at 03/20/15 1951    Principal Problem:   Inability to swallow Active Problems:   GERD (gastroesophageal reflux disease)   Obesity   Time spent:25  Paige Horcher PA-S [facility]  If 7PM-7AM, please contact night-coverage at [email or web address], password [place] 03/21/2015, 9:49 AM  LOS: 1 day     Attending MD note  Patient was seen, examined,treatment plan was  discussed with PA-S.  I have personally reviewed the clinical findings, lab, imaging studies and management of this patient in detail. I agree with the documentation, as recorded by the PA-S  Admitted with mostly solid food dysphagia-long-standing history of GERD-could have stricture related to GERD. Physical exam is unremarkable. Abdomen is soft. Gastroenterology consulted-planning on EGD today. Await results of EGD, results of EGD will determine disposition.  Rest as above.   [facility]

## 2015-03-21 NOTE — Op Note (Addendum)
[facility] [place], [address]   ENDOSCOPY PROCEDURE REPORT  PATIENT: [place], Marolda  MR#: [id number] BIRTHDATE: [birth year] , 84  yrs. old GENDER: female ENDOSCOPIST: Gatha Mayer, MD, [facility] PROCEDURE DATE:  03/21/2015 PROCEDURE:  EGD, diagnostic and Maloney dilation of esophagus ASA CLASS:     Class III INDICATIONS:  dysphagia. MEDICATIONS: Per Anesthesia and Monitored anesthesia care TOPICAL ANESTHETIC: Cetacaine Spray  DESCRIPTION OF PROCEDURE: After the risks benefits and alternatives of the procedure were thoroughly explained, informed consent was obtained.  The Pentax Gastroscope [id number] endoscope was introduced through the mouth and advanced to the second portion of the duodenum , Without limitations.  The instrument was slowly withdrawn as the mucosa was fully examined.    1) Mild antral gastritis - irregular patches of erythme and granularity 2) Otherwise looked normal 3) 54 Fr Maloney dilation easliy performed with mild resistance and no heme - reinspection showed dilation effect - tear in proximal esophagus.  Retroflexed views revealed no abnormalities.     The scope was then withdrawn from the patient and the procedure completed.  COMPLICATIONS: There were no immediate complications.  ENDOSCOPIC IMPRESSION: 1) Mild antral gastritis - irregular patches of erythma and granularity - probably from ibuprofen 2) Otherwise looked normal 3) 54 Fr Maloney dilation easliy performed with mild resistance and no heme - reinspection showed dilation effect - tear in proximal esophagus  RECOMMENDATIONS: 1.  Clear liquids until 4-5 PM, then soft foods rest of day.  Resume prior diet tomorrow.  - was my plan but says still cannot swallow saliva in recovery - proceed w/ speech pathology evaluation 2.  PPI qam, limit NSAID - see PCP about headaches 3.  F/U Dr.  [facility] if recurrent sxs  GI tract   eSigned:  Gatha Mayer,  MD, [facility] 03/21/2015 2:00 PM Revised: 03/21/2015 2:00 PM   CC:The Patient

## 2015-03-21 NOTE — Transfer of Care (Signed)
Immediate Anesthesia Transfer of Care Note  Patient: Frances Medina  Procedure(s) Performed: Procedure(s): ESOPHAGOGASTRODUODENOSCOPY (EGD) WITH PROPOFOL (N/A)  Patient Location: Endoscopy Unit  Anesthesia Type:MAC  Level of Consciousness: awake and alert   Airway & Oxygen Therapy: Patient Spontanous Breathing and Patient connected to nasal cannula oxygen  Post-op Assessment: Report given to RN and Post -op Vital signs reviewed and stable  Post vital signs: Reviewed and stable  Last Vitals:  Filed Vitals:   03/21/15 0538 03/21/15 1247  BP: 120/61 163/75  Pulse: 79 97  Temp: 36.7 C 36.6 C  Resp: 24 20    Complications: No apparent anesthesia complications

## 2015-03-21 NOTE — Progress Notes (Signed)
SLP Cancellation Note  Patient Details Name: CLERISSA BAFFA MRN: [id number] DOB: [birth year]   Cancelled treatment:       Reason Eval/Treat Not Completed: Medical issues which prohibited therapy - pt NPO pending procedure. Will check chart after GI w/u to determine if further SLP intervention is indicated.   Germain Osgood, M.A. CCC-SLP [phone]  Germain Osgood 03/21/2015, 8:39 AM

## 2015-03-22 ENCOUNTER — Inpatient Hospital Stay (HOSPITAL_COMMUNITY): Payer: BC Managed Care – PPO

## 2015-03-22 ENCOUNTER — Encounter (HOSPITAL_COMMUNITY): Payer: Self-pay | Admitting: Internal Medicine

## 2015-03-22 ENCOUNTER — Observation Stay (HOSPITAL_COMMUNITY): Payer: BC Managed Care – PPO

## 2015-03-22 DIAGNOSIS — Z23 Encounter for immunization: Secondary | ICD-10-CM | POA: Diagnosis not present

## 2015-03-22 DIAGNOSIS — K2960 Other gastritis without bleeding: Secondary | ICD-10-CM | POA: Diagnosis present

## 2015-03-22 DIAGNOSIS — F329 Major depressive disorder, single episode, unspecified: Secondary | ICD-10-CM | POA: Diagnosis present

## 2015-03-22 DIAGNOSIS — R1313 Dysphagia, pharyngeal phase: Secondary | ICD-10-CM | POA: Diagnosis present

## 2015-03-22 DIAGNOSIS — K219 Gastro-esophageal reflux disease without esophagitis: Secondary | ICD-10-CM | POA: Diagnosis present

## 2015-03-22 DIAGNOSIS — Z880 Allergy status to penicillin: Secondary | ICD-10-CM | POA: Diagnosis not present

## 2015-03-22 DIAGNOSIS — Z7901 Long term (current) use of anticoagulants: Secondary | ICD-10-CM | POA: Diagnosis not present

## 2015-03-22 DIAGNOSIS — Z8249 Family history of ischemic heart disease and other diseases of the circulatory system: Secondary | ICD-10-CM | POA: Diagnosis not present

## 2015-03-22 DIAGNOSIS — G510 Bell's palsy: Secondary | ICD-10-CM | POA: Diagnosis present

## 2015-03-22 DIAGNOSIS — J329 Chronic sinusitis, unspecified: Secondary | ICD-10-CM | POA: Diagnosis present

## 2015-03-22 DIAGNOSIS — M1711 Unilateral primary osteoarthritis, right knee: Secondary | ICD-10-CM | POA: Diagnosis present

## 2015-03-22 DIAGNOSIS — K449 Diaphragmatic hernia without obstruction or gangrene: Secondary | ICD-10-CM | POA: Diagnosis present

## 2015-03-22 DIAGNOSIS — F419 Anxiety disorder, unspecified: Secondary | ICD-10-CM | POA: Diagnosis present

## 2015-03-22 DIAGNOSIS — R471 Dysarthria and anarthria: Secondary | ICD-10-CM | POA: Diagnosis present

## 2015-03-22 DIAGNOSIS — R130 Aphagia: Secondary | ICD-10-CM | POA: Diagnosis not present

## 2015-03-22 DIAGNOSIS — Z888 Allergy status to other drugs, medicaments and biological substances status: Secondary | ICD-10-CM | POA: Diagnosis not present

## 2015-03-22 DIAGNOSIS — B373 Candidiasis of vulva and vagina: Secondary | ICD-10-CM | POA: Diagnosis present

## 2015-03-22 DIAGNOSIS — I10 Essential (primary) hypertension: Secondary | ICD-10-CM | POA: Diagnosis present

## 2015-03-22 DIAGNOSIS — M1712 Unilateral primary osteoarthritis, left knee: Secondary | ICD-10-CM | POA: Diagnosis present

## 2015-03-22 DIAGNOSIS — Z87891 Personal history of nicotine dependence: Secondary | ICD-10-CM | POA: Diagnosis not present

## 2015-03-22 DIAGNOSIS — Z96651 Presence of right artificial knee joint: Secondary | ICD-10-CM | POA: Diagnosis present

## 2015-03-22 DIAGNOSIS — G7000 Myasthenia gravis without (acute) exacerbation: Principal | ICD-10-CM

## 2015-03-22 DIAGNOSIS — R1310 Dysphagia, unspecified: Secondary | ICD-10-CM | POA: Diagnosis present

## 2015-03-22 DIAGNOSIS — Z79899 Other long term (current) drug therapy: Secondary | ICD-10-CM | POA: Diagnosis not present

## 2015-03-22 DIAGNOSIS — J302 Other seasonal allergic rhinitis: Secondary | ICD-10-CM | POA: Diagnosis present

## 2015-03-22 DIAGNOSIS — R718 Other abnormality of red blood cells: Secondary | ICD-10-CM | POA: Diagnosis present

## 2015-03-22 DIAGNOSIS — Z6841 Body Mass Index (BMI) 40.0 and over, adult: Secondary | ICD-10-CM | POA: Diagnosis not present

## 2015-03-22 DIAGNOSIS — E669 Obesity, unspecified: Secondary | ICD-10-CM | POA: Diagnosis not present

## 2015-03-22 LAB — GLUCOSE, CAPILLARY
Glucose-Capillary: 89 mg/dL (ref 65–99)
Glucose-Capillary: 90 mg/dL (ref 65–99)
Glucose-Capillary: 99 mg/dL (ref 65–99)
Glucose-Capillary: 90 mg/dL (ref 65–99)

## 2015-03-22 MED ORDER — IOHEXOL 300 MG/ML  SOLN
75.0000 mL | Freq: Once | INTRAMUSCULAR | Status: AC | PRN
Start: 2015-03-22 — End: 2015-03-22
  Administered 2015-03-22: 75 mL via INTRAVENOUS

## 2015-03-22 MED ORDER — IMMUNE GLOBULIN (HUMAN) 20 GM/200ML IV SOLN
400.0000 mg/kg | INTRAVENOUS | Status: AC
  Administered 2015-03-22 – 2015-03-26 (×5): 50 g via INTRAVENOUS
  Filled 2015-03-22 (×8): qty 100

## 2015-03-22 MED ORDER — PYRIDOSTIGMINE BROMIDE 60 MG PO TABS
60.0000 mg | ORAL_TABLET | Freq: Three times a day (TID) | ORAL | Status: DC
  Administered 2015-03-22: 60 mg via ORAL
  Filled 2015-03-22: qty 1

## 2015-03-22 MED ORDER — PYRIDOSTIGMINE BROMIDE 60 MG PO TABS
30.0000 mg | ORAL_TABLET | Freq: Three times a day (TID) | ORAL | Status: DC
  Administered 2015-03-22 – 2015-03-25 (×8): 30 mg via ORAL
  Filled 2015-03-22 (×3): qty 1
  Filled 2015-03-22 (×2): qty 0.5
  Filled 2015-03-22 (×2): qty 1
  Filled 2015-03-22: qty 0.5
  Filled 2015-03-22: qty 1

## 2015-03-22 NOTE — Care Management Note (Signed)
Case Management Note  Patient Details  Name: Frances Medina MRN: [id number] Date of Birth: 1960/06/23  Subjective/Objective:                  Date- 03-22-15 Initial Assessment Spoke with patient at the bedside.  Introduced self as Tourist information centre manager and explained role in discharge planning and how to be reached.  Verified patient lives in  [place] with husband and children..  Verified patient anticipates to go home with spouse and family at time of discharge and will have full time supervision.  Patient has DME cane that she uses at times because she has had knee surgery in the past. Expressed potential need for no other DME.  Patient denied needing help with their medication.  Patient drives to MD appointments.  Verified patient has PCP [facility]. Patient states they currently receive [facility] services through no one.   Admission Comments: Independent patient admitted from home for dysphagia. Had esophogeal stretching done yesterday. Continues to c/o not being able to swallow medications with applesauce.   Carles Collet RN BSN CM [phone]   Action/Plan:  Anticipate DC to home in 1 day. Expected Discharge Date:                  Expected Discharge Plan:  Home/Self Care  In-House Referral:     Discharge planning Services  CM Consult  Post Acute Care Choice:    Choice offered to:     DME Arranged:    DME Agency:     HH Arranged:    HH Agency:     Status of Service:  In process, will continue to follow  Medicare Important Message Given:    Date Medicare IM Given:    Medicare IM give by:    Date Additional Medicare IM Given:    Additional Medicare Important Message give by:     If discussed at [place] of Stay Meetings, dates discussed:    Additional Comments:  Carles Collet, RN 03/22/2015, 12:11 PM

## 2015-03-22 NOTE — Progress Notes (Signed)
Patient gave effort on VC and NIF but was unable to form a seal around mouthpiece and was therefore not able to perform test accurately. Patient said she has a nerve condition that prevented her from being able to close her lips and form a seal on the mouthpiece.

## 2015-03-22 NOTE — Progress Notes (Signed)
[facility] PROGRESS NOTE  Frances Medina [id number] DOB: 1960-06-09 DOA: 03/19/2015 PCP: [name]., DO  Subjective: Reports having less difficulty swallowing today. Denies chest pain, dyspnea, or abdominal pain.  Assessment/Plan: 1. New onset Dysphagia: EGD without significant findings that could explain dysphagia. Given the recent left eye ptosis (attributed to Bell's palsy) and no other explanation for dysphagia-neurology was consulted-per neurology has clinical features consistent with myasthenia gravis. We will hold off on barium esophagogram and-see if patient responds to Myasthenia treatment  2. Myasthenia Gravis:See above-Neuro recommending Pyridostigmine and Immune Globulin x 5 doses. CT Chest ordered by neurology to look for thymoma  3. Anxiety: Resume Sertraline and PRN Clonazepam   4. Morbid obesity: Continue to hold phendimetrazine   5. Allergic rhinitis: Continue to hold Zyrtec   Code Status: Full code Family Communication: None at bedside Disposition Plan: Likely home pending further workup   Consultants: Seen by neurology today  Procedures: EGD 1/18  Antibiotics: None  Objective: Filed Vitals:   03/21/15 2047 03/22/15 0639  BP: 126/77 134/54  Pulse: 103 85  Temp: 98.4 F (36.9 C) 98.5 F (36.9 C)  Resp: 16 18    Intake/Output Summary (Last 24 hours) at 03/22/15 1108 Last data filed at 03/22/15 0900  Gross per 24 hour  Intake    480 ml  Output      0 ml  Net    480 ml   Filed Weights   03/19/15 1629  Weight: 127.461 kg (281 lb)    Exam:   General:  Awake and alert, in no acute distress  HEENT: Left eye ptosis  Cardiovascular: S1, S2 regular, no mumurs  Respiratory: CTA bilaterally  Abdomen: Obese, non-tender  Data Reviewed: Basic Metabolic Panel:  Recent Labs Lab 03/19/15 1659 03/19/15 1706 03/20/15 1820 03/21/15 0615  NA 142 142  --  140  K 4.4 4.4  --  4.0  CL 101 102  --  106  CO2  --  30  --  26  GLUCOSE  91 97  --  83  BUN 6 5*  --  10  CREATININE 0.70 0.74 0.69 0.65  CALCIUM  --  9.7  --  8.7*   Liver Function Tests:  Recent Labs Lab 03/19/15 1706 03/21/15 0615  AST 23 21  ALT 19 15  ALKPHOS 92 75  BILITOT 0.4 0.7  PROT 7.3 6.4*  ALBUMIN 3.8 3.3*   No results for input(s): LIPASE, AMYLASE in the last 168 hours. No results for input(s): AMMONIA in the last 168 hours. CBC:  Recent Labs Lab 03/19/15 1659 03/19/15 1706 03/20/15 1820 03/21/15 0615  WBC  --  8.7 8.1 6.0  NEUTROABS  --  6.4  --   --   HGB 15.3* 13.2 12.6 12.0  HCT 45.0 41.4 39.3 38.0  MCV  --  74.1* 73.6* 74.2*  PLT  --  185 168 156   Cardiac Enzymes: No results for input(s): CKTOTAL, CKMB, CKMBINDEX, TROPONINI in the last 168 hours. BNP (last 3 results) No results for input(s): BNP in the last 8760 hours.  ProBNP (last 3 results) No results for input(s): PROBNP in the last 8760 hours.  CBG:  Recent Labs Lab 03/19/15 2331 03/21/15 1653 03/21/15 2045 03/22/15 0813  GLUCAP 77 77 79 89    No results found for this or any previous visit (from the past 240 hour(s)).   Studies: No results found.  Scheduled Meds: . enoxaparin (LOVENOX) injection  0.5 mg/kg Subcutaneous Q24H  .  estradiol  0.5 mg Oral Daily  . fluticasone  2 spray Each Nare Daily  . Influenza vac split quadrivalent PF  0.5 mL Intramuscular Tomorrow-1000  . pantoprazole  40 mg Oral QAC breakfast  . sertraline  50 mg Oral q morning - 10a   Continuous Infusions:   Principal Problem:   Inability to swallow Active Problems:   GERD (gastroesophageal reflux disease)   Obesity   Dysphagia  Time spent: 35 minutes  Signed:  Nena Alexander MD  [facility] If 7PM-7AM, please contact night-coverage at [email or web address], password [facility] 03/22/2015, 11:08 AM  LOS: 2 days

## 2015-03-22 NOTE — Progress Notes (Signed)
MBSS complete. Full report located under chart review in imaging section.  Talton Delpriore Paiewonsky, M.A. CCC-SLP [phone]  

## 2015-03-22 NOTE — Progress Notes (Signed)
Patient ID: Frances Medina, female   DOB: 1960/10/24, 55 y.o.   MRN: [id number]    Progress Note   Subjective   EGD with dilation yesterday- but pt c/o inabilty to swallow saliva etc post procedure   Objective   Vital signs in last 24 hours: Temp:  [97.7 F (36.5 C)-98.5 F (36.9 C)] 98.5 F (36.9 C) (01/19 0639) Pulse Rate:  [85-113] 85 (01/19 0639) Resp:  [12-25] 18 (01/19 0639) BP: (109-163)/(54-101) 134/54 mmHg (01/19 0639) SpO2:  [94 %-100 %] 96 % (01/19 0639) Last BM Date: 03/20/15     Assessment / Plan:    #1 55 yo female with acute dysphagia- etiology not clear , EGD with dilation was done yesterday but she did not have tight stricture and sxs sound more neurogenic. 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 I agree with the Advanced Practitioner's note, impression and recommendations.   I have seen the patient Speech path suggests esophageal dysmotility Neuro thinks myasthenia is possible  So - discussed w/ Dr. Nigel Bridgeman - she is being tx for MG - will see if dysphagia responds to that - and ptosis - if it does we have the answer. Would not do further esophageal diagnostic studies now.  Signing off Call if ?  Gatha Mayer, MD, [facility] [phone] (pager) [phone] after 5 PM, weekends and holidays  03/22/2015 12:44 PM

## 2015-03-22 NOTE — Consult Note (Signed)
NEURO HOSPITALIST CONSULT NOTE   Requestig physician: Dr. Sloan Leiter   Reason for Consult: myasthenia Gravis  HPI:                                                                                                                                          Frances Medina is an 55 y.o. female with a past medical history significant for HTN, migraine, and depression, who noted in December she had left eye ptosis and was diagnosed with Bell's Palsy.  She also noted at that time some dysphagia but over the past two weeks this has become so bad she is unable to eat solid foods at times.  She does not her ptosis and dysphagia is better in the AM and becomes worse as the day progresses.  She also admits to diplopia at times. Neurology was consulted due to concern for NMJ disorder.   Past Medical History  Diagnosis Date  . GERD (gastroesophageal reflux disease)   . 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 Knee injury   . Depression   . Seasonal allergies     takes Zytrec  . Fibroid uterus     size of a dime  . Umbilical hernia     watching , no plans for surgery at present  . H/O hiatal hernia   . Hypertension     "went away when I stopped smoking"  . Anxiety   . Migraine     "maybe couple times/month" (03/20/2015)  . Arthritis     "knees" (03/20/2015)  . Osteoarthritis of right knee 08/30/2013  . Osteoarthritis of left knee 12/02/2013  . Bell's palsy     affecting left eye vision    Past Surgical History  Procedure Laterality Date  . Cesarean section  1987; 1989  . Dilation and curettage of uterus    . Tubal ligation  1989  . Vaginal hysterectomy  1990's?    "apparently took out one of my ovaries at the time too cause one's missing"  . Partial knee arthroplasty Right 08/30/2013    Procedure: RIGHT UNICOMPARTMENTAL KNEE;  Surgeon: Johnny Bridge, MD;  Location: [place];  Service: Orthopedics;  Laterality: Right;  . Colonoscopy with propofol N/A 09/19/2013    Procedure: COLONOSCOPY  WITH PROPOFOL;  Surgeon: Ladene Artist, MD;  Location: [facility];  Service: Endoscopy;  Laterality: N/A;  . Partial knee arthroplasty Left 12/02/2013    Procedure: LEFT KNEE UNI ARTHROPLASTY;  Surgeon: Johnny Bridge, MD;  Location: [place];  Service: Orthopedics;  Laterality: Left;  . Esophagogastroduodenoscopy (egd) with propofol N/A 03/21/2015    Procedure: ESOPHAGOGASTRODUODENOSCOPY (EGD) WITH PROPOFOL;  Surgeon: Gatha Mayer, MD;  Location: [place];  Service: Endoscopy;  Laterality: N/A;  Family History  Problem Relation Age of Onset  . Heart disease Mother 5  . Hypertension Mother   . Stroke Father   . Hypertension Father   . Colon cancer Neg Hx   . Other Sister     Esophageal strictures  . Anemia Sister     Social History:  reports that she quit smoking about 2 years ago. Her smoking use included Cigarettes. She has a 20 pack-year smoking history. She has never used smokeless tobacco. She reports that she does not drink alcohol or use illicit drugs.  Allergies  Allergen Reactions  . Dicyclomine Hives  . Methocarbamol Hives  . Penicillins Hives    MEDICATIONS:                                                                                                                     Prior to Admission:  Prescriptions prior to admission  Medication Sig Dispense Refill Last Dose  . cetirizine (ZYRTEC) 10 MG tablet Take 10 mg by mouth daily as needed for allergies.    Past Month at Unknown time  . Chlorphen-PE-Acetaminophen (NOREL AD) 4-10-325 MG TABS Take 1 tablet by mouth 2 (two) times daily as needed (FOR SINUSES).   03/18/2015 at Unknown time  . clonazePAM (KLONOPIN) 1 MG tablet Take 0.5-1 mg by mouth 2 (two) times daily as needed for anxiety.   03/19/2015 at Unknown time  . estradiol (ESTRACE) 0.5 MG tablet Take 0.5 mg by mouth daily.   03/18/2015 at Unknown time  . Phendimetrazine Tartrate 35 MG TABS Take 35 mg by mouth daily.   03/19/2015 at Unknown  time  . sertraline (ZOLOFT) 100 MG tablet Take 50 mg by mouth every morning.    03/19/2015 at Unknown time   Scheduled: . enoxaparin (LOVENOX) injection  0.5 mg/kg Subcutaneous Q24H  . estradiol  0.5 mg Oral Daily  . fluticasone  2 spray Each Nare Daily  . Immune Globulin 10%  400 mg/kg Intravenous Q24 Hr x 5  . pantoprazole  40 mg Oral QAC breakfast  . pyridostigmine  60 mg Oral 3 times per day  . sertraline  50 mg Oral q morning - 10a     ROS:                                                                                                                                       History obtained  from the patient  General ROS: negative for - chills, fatigue, fever, night sweats, weight gain or weight loss Psychological ROS: negative for - behavioral disorder, hallucinations, memory difficulties, mood swings or suicidal ideation Ophthalmic ROS: negative for - blurry vision, double vision, eye pain or loss of vision ENT ROS: negative for - epistaxis, nasal discharge, oral lesions, sore throat, tinnitus or vertigo Allergy and Immunology ROS: negative for - hives or itchy/watery eyes Hematological and Lymphatic ROS: negative for - bleeding problems, bruising or swollen lymph nodes Endocrine ROS: negative for - galactorrhea, hair pattern changes, polydipsia/polyuria or temperature intolerance Respiratory ROS: negative for - cough, hemoptysis, shortness of breath or wheezing Cardiovascular ROS: negative for - chest pain, dyspnea on exertion, edema or irregular heartbeat Gastrointestinal ROS: negative for - abdominal pain, diarrhea, hematemesis, nausea/vomiting or stool incontinence Genito-Urinary ROS: negative for - dysuria, hematuria, incontinence or urinary frequency/urgency Musculoskeletal ROS: negative for - joint swelling or muscular weakness Neurological ROS: as noted in HPI Dermatological ROS: negative for rash and skin lesion changes   Blood pressure 134/54, pulse 85, temperature 98.5  F (36.9 C), temperature source Oral, resp. rate 18, height 5' (1.524 m), weight 127.461 kg (281 lb), SpO2 96 %.   Neurologic Examination:                                                                                                      HEENT-  Normocephalic, no lesions, without obvious abnormality.  Normal external eye and conjunctiva.  Normal TM's bilaterally.  Normal auditory canals and external ears. Normal external nose, mucus membranes and septum.  Normal pharynx. Cardiovascular- S1, S2 normal, pulses palpable throughout   Lungs- chest clear, no wheezing, rales, normal symmetric air entry Abdomen- normal findings: bowel sounds normal Extremities- no edema Lymph-no adenopathy palpable Musculoskeletal-no joint tenderness, deformity or swelling Skin-warm and dry, no hyperpigmentation, vitiligo, or suspicious lesions  Neurological Examination Mental Status: Alert, oriented, thought content appropriate.  Speech dysarthric without evidence of aphasia.  Able to follow 3 step commands without difficulty. Cranial Nerves: II: Discs flat bilaterally; Visual fields grossly normal, pupils equal, round, reactive to light and accommodation III,IV, VI: positive ptosis left eye with lid lag after prolonged vertical gaze, extra-ocular motions intact bilaterally V,VII: smile symmetric, facial light touch sensation normal bilaterally VIII: hearing normal bilaterally IX,X: uvula rises symmetrically XI: bilateral shoulder shrug XII: midline tongue extension Motor: Right : Upper extremity   5/5    Left:     Upper extremity   5/5  Lower extremity   5/5     Lower extremity   5/5 --increased weakness with prolonged fist pump Tone and bulk:normal tone throughout; no atrophy noted Sensory: Pinprick and light touch intact throughout, bilaterally Deep Tendon Reflexes: 1+ and symmetric throughout Plantars: Right: downgoing   Left: downgoing Cerebellar: normal finger-to-nose  and normal heel-to-shin  test Gait: not tested      Lab Results: Basic Metabolic Panel:  Recent Labs Lab 03/19/15 1659 03/19/15 1706 03/20/15 1820 03/21/15 0615  NA 142 142  --  140  K 4.4 4.4  --  4.0  CL 101 102  --  106  CO2  --  30  --  26  GLUCOSE 91 97  --  83  BUN 6 5*  --  10  CREATININE 0.70 0.74 0.69 0.65  CALCIUM  --  9.7  --  8.7*    Liver Function Tests:  Recent Labs Lab 03/19/15 1706 03/21/15 0615  AST 23 21  ALT 19 15  ALKPHOS 92 75  BILITOT 0.4 0.7  PROT 7.3 6.4*  ALBUMIN 3.8 3.3*   No results for input(s): LIPASE, AMYLASE in the last 168 hours. No results for input(s): AMMONIA in the last 168 hours.  CBC:  Recent Labs Lab 03/19/15 1659 03/19/15 1706 03/20/15 1820 03/21/15 0615  WBC  --  8.7 8.1 6.0  NEUTROABS  --  6.4  --   --   HGB 15.3* 13.2 12.6 12.0  HCT 45.0 41.4 39.3 38.0  MCV  --  74.1* 73.6* 74.2*  PLT  --  185 168 156    Cardiac Enzymes: No results for input(s): CKTOTAL, CKMB, CKMBINDEX, TROPONINI in the last 168 hours.  Lipid Panel: No results for input(s): CHOL, TRIG, HDL, CHOLHDL, VLDL, LDLCALC in the last 168 hours.  CBG:  Recent Labs Lab 03/19/15 2331 03/21/15 1653 03/21/15 2045 03/22/15 0813  GLUCAP [id number]    Microbiology: Results for orders placed or performed during the hospital encounter of 08/19/13  Surgical pcr screen     Status: Abnormal   Collection Time: 08/19/13  9:49 AM  Result Value Ref Range Status   MRSA, PCR NEGATIVE NEGATIVE Final   Staphylococcus aureus POSITIVE (A) NEGATIVE Final    Comment:        The Xpert SA Assay (FDA approved for NASAL specimens in patients over 23 years of age), is one component of a comprehensive surveillance program.  Test performance has been validated by [employer] for patients greater than or equal to 8 year old. It is not intended to diagnose infection nor to guide or monitor treatment.    Coagulation Studies:  Recent Labs  03/19/15 1706  LABPROT 14.0  INR  1.06    Imaging: Dg Swallowing Func-speech Pathology  03/22/2015  Objective Swallowing Evaluation:   Patient Details Name: Frances Medina MRN: [id number] Date of Birth: 05/18/1960 Today's Date: 03/22/2015 Time: SLP Start Time (ACUTE ONLY): 0955-SLP Stop Time (ACUTE ONLY): 1014 SLP Time Calculation (min) (ACUTE ONLY): 19 min Past Medical History: Past Medical History Diagnosis Date . GERD (gastroesophageal reflux disease)  . 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 Knee injury  . Depression  . Seasonal allergies    takes Zytrec . Fibroid uterus    size of a dime . Umbilical hernia    watching , no plans for surgery at present . H/O hiatal hernia  . Hypertension    "went away when I stopped smoking" . Anxiety  . Migraine    "maybe couple times/month" (03/20/2015) . Arthritis    "knees" (03/20/2015) . Osteoarthritis of right knee 08/30/2013 . Osteoarthritis of left knee 12/02/2013 . Bell's palsy    affecting left eye vision Past Surgical History: Past Surgical History Procedure Laterality Date . Cesarean section  1987; 1989 . Dilation and curettage of uterus   . Tubal ligation  1989 . Vaginal hysterectomy  1990's?   "apparently took out one of my ovaries at the time too cause one's missing" . Partial knee arthroplasty Right 08/30/2013   Procedure: RIGHT UNICOMPARTMENTAL KNEE;  Surgeon: Johnny Bridge, MD;  Location: [place];  Service: Orthopedics;  Laterality: Right; . Colonoscopy with propofol N/A 09/19/2013   Procedure: COLONOSCOPY WITH PROPOFOL;  Surgeon: Ladene Artist, MD;  Location: [facility];  Service: Endoscopy;  Laterality: N/A; . Partial knee arthroplasty Left 12/02/2013   Procedure: LEFT KNEE UNI ARTHROPLASTY;  Surgeon: Johnny Bridge, MD;  Location: [place];  Service: Orthopedics;  Laterality: Left; . Esophagogastroduodenoscopy (egd) with propofol N/A 03/21/2015   Procedure: ESOPHAGOGASTRODUODENOSCOPY (EGD) WITH PROPOFOL;  Surgeon: Gatha Mayer, MD;  Location: [place];  Service: Endoscopy;   Laterality: N/A; HPI: AXELLE DEELY is a 55 y.o. female with a past medical history significant for MO, hiatal hernia, anxiety and recent Bell's palsy who presents with difficulty swallowing and the sensation of something in her throat for 1 week. Subjective: pt describes persistent difficulties swallowing Assessment / Plan / Recommendation CHL IP CLINICAL IMPRESSIONS 03/22/2015 Therapy Diagnosis Mild pharyngeal phase dysphagia;Suspected primary esophageal dysphagia Clinical Impression Pt has c/o generalized oral weakness, although functionally she consumes thin liquids and purees without overt difficulties. A pharyngeal swallow is triggered in a timely manner and with good hyolaryngeal elevation, yet residue remains in the valleculae and pyrifrom sinuses after the swallow. Boluses enter into the esophagus, but with backflow that carries them back into the pharynx, resulting in flash penetration of puree x1. Pt uses multiple, reflexive swallows to assist with pharyngeal clearance. Barium appeared to remain in the esophagus during brief scan, although MD was not present to confirm. Given the above, continue to suspect a primary esophageal dysphagia with concern for higher sub-UES pressures leading to pharyngeal residue. Recommend to consider additional f/u from GI and/or primary medical team to assess esophageal motility. For now, would recommend a full liquid diet and use of aspiration and esophageal precautions. Impact on safety and function Mild aspiration risk   CHL IP TREATMENT RECOMMENDATION 03/22/2015 Treatment Recommendations Therapy as outlined in treatment plan below   Prognosis 03/22/2015 Prognosis for Safe Diet Advancement Fair Barriers to Reach Goals (No Data) Barriers/Prognosis Comment -- CHL IP DIET RECOMMENDATION 03/22/2015 SLP Diet Recommendations Thin liquid Liquid Administration via Cup Medication Administration Crushed with puree Compensations Slow rate;Small sips/bites;Follow solids with liquid  Postural Changes Remain semi-upright after after feeds/meals (Comment);Seated upright at 90 degrees   CHL IP OTHER RECOMMENDATIONS 03/22/2015 Recommended Consults Consider esophageal assessment Oral Care Recommendations Oral care BID Other Recommendations --   CHL IP FOLLOW UP RECOMMENDATIONS 03/22/2015 Follow up Recommendations (No Data)   CHL IP FREQUENCY AND DURATION 03/22/2015 Speech Therapy Frequency (ACUTE ONLY) min 2x/week Treatment Duration 2 weeks      CHL IP ORAL PHASE 03/22/2015 Oral Phase WFL Oral - Pudding Teaspoon -- Oral - Pudding Cup -- Oral - Honey Teaspoon -- Oral - Honey Cup -- Oral - Nectar Teaspoon -- Oral - Nectar Cup -- Oral - Nectar Straw -- Oral - Thin Teaspoon -- Oral - Thin Cup -- Oral - Thin Straw -- Oral - Puree -- Oral - Mech Soft -- Oral - Regular -- Oral - Multi-Consistency -- Oral - Pill -- Oral Phase - Comment --  CHL IP PHARYNGEAL PHASE 03/22/2015 Pharyngeal Phase Impaired Pharyngeal- Pudding Teaspoon -- Pharyngeal -- Pharyngeal- Pudding Cup -- Pharyngeal -- Pharyngeal- Honey Teaspoon -- Pharyngeal -- Pharyngeal- Honey Cup -- Pharyngeal -- Pharyngeal- Nectar Teaspoon -- Pharyngeal -- Pharyngeal- Nectar Cup -- Pharyngeal -- Pharyngeal- Nectar Straw -- Pharyngeal -- Pharyngeal- Thin Teaspoon -- Pharyngeal -- Pharyngeal- Thin Cup Pharyngeal residue -  valleculae;Pharyngeal residue - pyriform Pharyngeal -- Pharyngeal- Thin Straw -- Pharyngeal -- Pharyngeal- Puree Pharyngeal residue - valleculae;Pharyngeal residue - pyriform;Pharyngeal residue - posterior pharnyx;Penetration/Apiration after swallow Pharyngeal Material enters airway, remains ABOVE vocal cords then ejected out Pharyngeal- Mechanical Soft -- Pharyngeal -- Pharyngeal- Regular -- Pharyngeal -- Pharyngeal- Multi-consistency -- Pharyngeal -- Pharyngeal- Pill -- Pharyngeal -- Pharyngeal Comment --  CHL IP CERVICAL ESOPHAGEAL PHASE 03/22/2015 Cervical Esophageal Phase Impaired Pudding Teaspoon -- Pudding Cup -- Honey Teaspoon -- Honey  Cup -- Nectar Teaspoon -- Nectar Cup -- Nectar Straw -- Thin Teaspoon -- Thin Cup Esophageal backflow into the pharynx Thin Straw -- Puree Esophageal backflow into the pharynx;Esophageal backflow into the larynx Mechanical Soft -- Regular -- Multi-consistency -- Pill -- Cervical Esophageal Comment -- CHL IP GO 03/20/2015 Functional Assessment Tool Used skilled clinical judgment Functional Limitations Swallowing Swallow Current Status [id number]) CK Swallow Goal Status [id number]) [place] Discharge Status [id number]) (None) Motor Speech Current Status [id number]) (None) Motor Speech Goal Status [id number]) (None) Motor Speech Goal Status [id number]) (None) Spoken Language Comprehension Current Status [id number]) (None) Spoken Language Comprehension Goal Status [id number]) (None) Spoken Language Comprehension Discharge Status [phone]) (None) Spoken Language Expression Current Status [phone]) (None) Spoken Language Expression Goal Status [id number]) (None) Spoken Language Expression Discharge Status [phone]) (None) Attention Current Status [id number]) (None) Attention Goal Status [id number]) (None) Attention Discharge Status [id number]) (None) Memory Current Status [id number]) (None) Memory Goal Status [id number]) (None) Memory Discharge Status [id number]) (None) Voice Current Status [id number]) (None) Voice Goal Status [id number]) (None) Voice Discharge Status [id number]) (None) Other Speech-Language Pathology Functional Limitation [phone]) (None) Other Speech-Language Pathology Functional Limitation Goal Status [id number]) (None) Other Speech-Language Pathology Functional Limitation Discharge Status [id number]) (None) Germain Osgood, M.A. CCC-SLP [phone] Germain Osgood 03/22/2015, 11:21 AM                  Assessment and plan per attending neurologist  Etta Quill PA-C [facility] [phone]  03/22/2015, 11:35 AM   Assessment/Plan:  55 YO female with 2 month progression of left eye ptosis and dysphagia. On exam she has positive lid lag, left eye ptosis,  dysarthria, and complains of occasional diplopia.  Given history, this most likely represents myasthenia gravis.  She is very symptomatic clinically, with significant dysphagia and left ptosis, thus will start treatment pending testing results.  Recommend: 1) start Mestinon 60 mg TID 2) IViG daily for 5 days 3) Daily NIF and VC 4) myasthenia panel.   Patient seen and examined together with physician assistant and I concur with the assessment and plan.  Dorian Pod, MD

## 2015-03-23 MED ORDER — ONDANSETRON HCL 4 MG/2ML IJ SOLN: 4.0000 mg | Freq: Once | INTRAMUSCULAR | Status: DC | PRN

## 2015-03-23 MED ORDER — FENTANYL CITRATE (PF) 100 MCG/2ML IJ SOLN: 25.0000 ug | INTRAMUSCULAR | Status: DC | PRN

## 2015-03-23 MED ORDER — OXYCODONE HCL 5 MG/5ML PO SOLN: 5.0000 mg | Freq: Once | ORAL | Status: DC | PRN

## 2015-03-23 MED ORDER — OXYCODONE HCL 5 MG PO TABS: 5.0000 mg | ORAL_TABLET | Freq: Once | ORAL | Status: DC | PRN

## 2015-03-23 NOTE — Progress Notes (Signed)
Patient gave effort on VC and NIF but was unable to form a seal around mouthpiece and was therefore not able to perform test accurately. Patient said she has a nerve condition that prevented her from being able to close her lips and form a seal on the mouthpiece. Pt apologize for not being able to perform test.

## 2015-03-23 NOTE — Progress Notes (Signed)
Speech Language Pathology Treatment: Dysphagia  Patient Details Name: Frances Medina MRN: [id number] DOB: Sep 16, 1960 Today's Date: 03/23/2015 Time: [id number] SLP Time Calculation (min) (ACUTE ONLY): 20 min  Assessment / Plan / Recommendation Clinical Impression  Pt is much improved today after initiation of IVIG, although throughout SLP visit she does have return of her left eye ptosis and slurred speech, likely due to fatigue. Swallowing appears greatly improved today with no overt difficulties observed; however, I still have some concern about her fatiguing throughout meals given fatigue noted across limited PO trials. Recommend a Dys 2 diet and thin liquids for energy conservation, taking breaks PRN. Discussed with pt and she is in agreement. Will continue to follow and advance.   HPI HPI: Frances Medina is a 55 y.o. female with a past medical history significant for MO, hiatal hernia, anxiety and recent Bell's palsy who presents with difficulty swallowing and the sensation of something in her throat for 1 week.      SLP Plan  Continue with current plan of care     Recommendations  Diet recommendations: Dysphagia 2 (fine chop);Thin liquid Liquids provided via: Cup;Straw Medication Administration: Whole meds with puree Supervision: Patient able to self feed;Intermittent supervision to cue for compensatory strategies Compensations: Slow rate;Small sips/bites;Follow solids with liquid Postural Changes and/or Swallow Maneuvers: Seated upright 90 degrees;Upright 30-60 min after meal             Oral Care Recommendations: Oral care BID Follow up Recommendations: None Plan: Continue with current plan of care     GO               Germain Osgood, M.A. CCC-SLP [phone]  Germain Osgood 03/23/2015, 12:06 PM

## 2015-03-23 NOTE — Progress Notes (Signed)
[facility] PROGRESS NOTE  Frances Medina [id number] DOB: 1960-11-15 DOA: 03/19/2015 PCP: [name]., DO  Subjective: States swallowing is much better today, no left eye lid lag  Assessment/Plan: New onset Dysphagia: EGD without significant findings that could explain dysphagia. Given the recent left eye ptosis (attributed to Bell's palsy) and no other explanation for dysphagia-neurology was consulted-per neurology has clinical features consistent with myasthenia gravis. We will hold off on barium esophagogram. Patient successfully responded to Myasthenia treatment, currently no lid lag on left eye and dysphagia has markedly improved today.   Myasthenia Gravis: See above-Currently on Pyridostigmine and Immune Globulin x 5 doses. CT Chest ordered by neurology to look for thymoma-no evidence of thymic tissue/mass, mediastinal mass, or enlarged lymph nodes  Anxiety: Continue Sertraline and PRN Clonazepam   Morbid obesity: Continue to hold phendimetrazine   Allergic rhinitis: Continue to hold Zyrtec   Code Status: Full code Family Communication: None at bedside Disposition Plan: Will remain inpatient for total 5-day Immune Globulin treatment - 4 doses remaining  Consultants: GI and Neuro following  Procedures: EGD 1/18  Antibiotics: None  Objective: Filed Vitals:   03/22/15 2127 03/23/15 0625  BP: 131/62 143/95  Pulse: 76 70  Temp: 97.9 F (36.6 C)   Resp: 18 18    Intake/Output Summary (Last 24 hours) at 03/23/15 1122 Last data filed at 03/23/15 0900  Gross per 24 hour  Intake    440 ml  Output      0 ml  Net    440 ml   Filed Weights   03/19/15 1629  Weight: 127.461 kg (281 lb)    Exam:   General: Awake and alert, in no acute distress  HEENT: No ptosis or lid lag bilaterally  Cardiovascular: S1, S2 regular, no mumurs  Respiratory: CTA bilaterally  Abdomen: Obese, non-tender  Data Reviewed: Basic Metabolic Panel:  Recent Labs Lab  03/19/15 1659 03/19/15 1706 03/20/15 1820 03/21/15 0615  NA 142 142  --  140  K 4.4 4.4  --  4.0  CL 101 102  --  106  CO2  --  30  --  26  GLUCOSE 91 97  --  83  BUN 6 5*  --  10  CREATININE 0.70 0.74 0.69 0.65  CALCIUM  --  9.7  --  8.7*   Liver Function Tests:  Recent Labs Lab 03/19/15 1706 03/21/15 0615  AST 23 21  ALT 19 15  ALKPHOS 92 75  BILITOT 0.4 0.7  PROT 7.3 6.4*  ALBUMIN 3.8 3.3*   No results for input(s): LIPASE, AMYLASE in the last 168 hours. No results for input(s): AMMONIA in the last 168 hours. CBC:  Recent Labs Lab 03/19/15 1659 03/19/15 1706 03/20/15 1820 03/21/15 0615  WBC  --  8.7 8.1 6.0  NEUTROABS  --  6.4  --   --   HGB 15.3* 13.2 12.6 12.0  HCT 45.0 41.4 39.3 38.0  MCV  --  74.1* 73.6* 74.2*  PLT  --  185 168 156   Cardiac Enzymes: No results for input(s): CKTOTAL, CKMB, CKMBINDEX, TROPONINI in the last 168 hours. BNP (last 3 results) No results for input(s): BNP in the last 8760 hours.  ProBNP (last 3 results) No results for input(s): PROBNP in the last 8760 hours.  CBG:  Recent Labs Lab 03/21/15 2045 03/22/15 0813 03/22/15 1210 03/22/15 1701 03/22/15 2125  GLUCAP 79 89 90 99 90    No results found for this or  any previous visit (from the past 240 hour(s)).   Studies: Ct Chest W Contrast  03/22/2015  CLINICAL DATA:  55 year old female with myasthenia gravis. EXAM: CT CHEST WITH CONTRAST TECHNIQUE: Multidetector CT imaging of the chest was performed during intravenous contrast administration. CONTRAST:  11mL OMNIPAQUE IOHEXOL 300 MG/ML  SOLN COMPARISON:  08/19/2013 chest radiograph. FINDINGS: Mediastinum/Nodes: There is no evidence of thymic tissue or thymic mass. No mediastinal mass or enlarged lymph nodes are identified. The heart and great vessels are unremarkable. There is no evidence of pericardial effusion. Lungs/Pleura: Mild bibasilar atelectasis/scarring noted. There is no evidence of mass, nodule, airspace disease  or consolidation. No endobronchial or endotracheal lesions are identified. No pleural effusions are present. Upper abdomen: Unremarkable. Musculoskeletal: No acute or significant abnormalities identified. IMPRESSION: No evidence of thymic abnormality or mediastinal mass. Mild basilar atelectasis/scarring. Electronically Signed   By: Margarette Canada M.D.   On: 03/22/2015 21:38   Dg Swallowing Func-speech Pathology  03/22/2015  Objective Swallowing Evaluation:   Patient Details Name: Frances Medina MRN: [id number] Date of Birth: May 06, 1960 Today's Date: 03/22/2015 Time: SLP Start Time (ACUTE ONLY): 0955-SLP Stop Time (ACUTE ONLY): 1014 SLP Time Calculation (min) (ACUTE ONLY): 19 min Past Medical History: Past Medical History Diagnosis Date . GERD (gastroesophageal reflux disease)  . 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 Knee injury  . Depression  . Seasonal allergies    takes Zytrec . Fibroid uterus    size of a dime . Umbilical hernia    watching , no plans for surgery at present . H/O hiatal hernia  . Hypertension    "went away when I stopped smoking" . Anxiety  . Migraine    "maybe couple times/month" (03/20/2015) . Arthritis    "knees" (03/20/2015) . Osteoarthritis of right knee 08/30/2013 . Osteoarthritis of left knee 12/02/2013 . Bell's palsy    affecting left eye vision Past Surgical History: Past Surgical History Procedure Laterality Date . Cesarean section  1987; 1989 . Dilation and curettage of uterus   . Tubal ligation  1989 . Vaginal hysterectomy  1990's?   "apparently took out one of my ovaries at the time too cause one's missing" . Partial knee arthroplasty Right 08/30/2013   Procedure: RIGHT UNICOMPARTMENTAL KNEE;  Surgeon: Johnny Bridge, MD;  Location: [facility];  Service: Orthopedics;  Laterality: Right; . Colonoscopy with propofol N/A 09/19/2013   Procedure: COLONOSCOPY WITH PROPOFOL;  Surgeon: Ladene Artist, MD;  Location: [facility];  Service: Endoscopy;  Laterality: N/A; . Partial knee arthroplasty Left 12/02/2013    Procedure: LEFT KNEE UNI ARTHROPLASTY;  Surgeon: Johnny Bridge, MD;  Location: [place];  Service: Orthopedics;  Laterality: Left; . Esophagogastroduodenoscopy (egd) with propofol N/A 03/21/2015   Procedure: ESOPHAGOGASTRODUODENOSCOPY (EGD) WITH PROPOFOL;  Surgeon: Gatha Mayer, MD;  Location: [place];  Service: Endoscopy;  Laterality: N/A; HPI: TEZRA CROFFORD is a 55 y.o. female with a past medical history significant for MO, hiatal hernia, anxiety and recent Bell's palsy who presents with difficulty swallowing and the sensation of something in her throat for 1 week. Subjective: pt describes persistent difficulties swallowing Assessment / Plan / Recommendation CHL IP CLINICAL IMPRESSIONS 03/22/2015 Therapy Diagnosis Mild pharyngeal phase dysphagia;Suspected primary esophageal dysphagia Clinical Impression Pt has c/o generalized oral weakness, although functionally she consumes thin liquids and purees without overt difficulties. A pharyngeal swallow is triggered in a timely manner and with good hyolaryngeal elevation, yet residue remains in the valleculae and pyrifrom sinuses after  the swallow. Boluses enter into the esophagus, but with backflow that carries them back into the pharynx, resulting in flash penetration of puree x1. Pt uses multiple, reflexive swallows to assist with pharyngeal clearance. Barium appeared to remain in the esophagus during brief scan, although MD was not present to confirm. Given the above, continue to suspect a primary esophageal dysphagia with concern for higher sub-UES pressures leading to pharyngeal residue. Recommend to consider additional f/u from GI and/or primary medical team to assess esophageal motility. For now, would recommend a full liquid diet and use of aspiration and esophageal precautions. Impact on safety and function Mild aspiration risk   CHL IP TREATMENT RECOMMENDATION 03/22/2015 Treatment Recommendations Therapy as outlined in treatment plan  below   Prognosis 03/22/2015 Prognosis for Safe Diet Advancement Fair Barriers to Reach Goals (No Data) Barriers/Prognosis Comment -- CHL IP DIET RECOMMENDATION 03/22/2015 SLP Diet Recommendations Thin liquid Liquid Administration via Cup Medication Administration Crushed with puree Compensations Slow rate;Small sips/bites;Follow solids with liquid Postural Changes Remain semi-upright after after feeds/meals (Comment);Seated upright at 90 degrees   CHL IP OTHER RECOMMENDATIONS 03/22/2015 Recommended Consults Consider esophageal assessment Oral Care Recommendations Oral care BID Other Recommendations --   CHL IP FOLLOW UP RECOMMENDATIONS 03/22/2015 Follow up Recommendations (No Data)   CHL IP FREQUENCY AND DURATION 03/22/2015 Speech Therapy Frequency (ACUTE ONLY) min 2x/week Treatment Duration 2 weeks      CHL IP ORAL PHASE 03/22/2015 Oral Phase WFL Oral - Pudding Teaspoon -- Oral - Pudding Cup -- Oral - Honey Teaspoon -- Oral - Honey Cup -- Oral - Nectar Teaspoon -- Oral - Nectar Cup -- Oral - Nectar Straw -- Oral - Thin Teaspoon -- Oral - Thin Cup -- Oral - Thin Straw -- Oral - Puree -- Oral - Mech Soft -- Oral - Regular -- Oral - Multi-Consistency -- Oral - Pill -- Oral Phase - Comment --  CHL IP PHARYNGEAL PHASE 03/22/2015 Pharyngeal Phase Impaired Pharyngeal- Pudding Teaspoon -- Pharyngeal -- Pharyngeal- Pudding Cup -- Pharyngeal -- Pharyngeal- Honey Teaspoon -- Pharyngeal -- Pharyngeal- Honey Cup -- Pharyngeal -- Pharyngeal- Nectar Teaspoon -- Pharyngeal -- Pharyngeal- Nectar Cup -- Pharyngeal -- Pharyngeal- Nectar Straw -- Pharyngeal -- Pharyngeal- Thin Teaspoon -- Pharyngeal -- Pharyngeal- Thin Cup Pharyngeal residue - valleculae;Pharyngeal residue - pyriform Pharyngeal -- Pharyngeal- Thin Straw -- Pharyngeal -- Pharyngeal- Puree Pharyngeal residue - valleculae;Pharyngeal residue - pyriform;Pharyngeal residue - posterior pharnyx;Penetration/Apiration after swallow Pharyngeal Material enters airway, remains ABOVE  vocal cords then ejected out Pharyngeal- Mechanical Soft -- Pharyngeal -- Pharyngeal- Regular -- Pharyngeal -- Pharyngeal- Multi-consistency -- Pharyngeal -- Pharyngeal- Pill -- Pharyngeal -- Pharyngeal Comment --  CHL IP CERVICAL ESOPHAGEAL PHASE 03/22/2015 Cervical Esophageal Phase Impaired Pudding Teaspoon -- Pudding Cup -- Honey Teaspoon -- Honey Cup -- Nectar Teaspoon -- Nectar Cup -- Nectar Straw -- Thin Teaspoon -- Thin Cup Esophageal backflow into the pharynx Thin Straw -- Puree Esophageal backflow into the pharynx;Esophageal backflow into the larynx Mechanical Soft -- Regular -- Multi-consistency -- Pill -- Cervical Esophageal Comment -- CHL IP GO 03/20/2015 Functional Assessment Tool Used skilled clinical judgment Functional Limitations Swallowing Swallow Current Status [id number]) CK Swallow Goal Status [id number]) [place] Swallow Discharge Status [id number]) (None) Motor Speech Current Status [id number]) (None) Motor Speech Goal Status [id number]) (None) Motor Speech Goal Status [id number]) (None) Spoken Language Comprehension Current Status [id number]) (None) Spoken Language Comprehension Goal Status [id number]) (None) Spoken Language Comprehension Discharge Status [phone]) (None) Spoken Language Expression Current Status [id number]) (None) Spoken Language Expression Goal Status [id number]) (  None) Spoken Language Expression Discharge Status [phone]) (None) Attention Current Status [phone]) (None) Attention Goal Status [id number]) (None) Attention Discharge Status [phone]) (None) Memory Current Status [id number]) (None) Memory Goal Status [id number]) (None) Memory Discharge Status [id number]) (None) Voice Current Status [id number]) (None) Voice Goal Status [id number]) (None) Voice Discharge Status [id number]) (None) Other Speech-Language Pathology Functional Limitation [phone]) (None) Other Speech-Language Pathology Functional Limitation Goal Status [id number]) (None) Other Speech-Language Pathology Functional Limitation Discharge Status [phone]) (None) Germain Osgood, M.A. CCC-SLP  [phone] Germain Osgood 03/22/2015, 11:21 AM               Scheduled Meds: . enoxaparin (LOVENOX) injection  0.5 mg/kg Subcutaneous Q24H  . estradiol  0.5 mg Oral Daily  . fluticasone  2 spray Each Nare Daily  . Immune Globulin 10%  400 mg/kg Intravenous Q24 Hr x 5  . pantoprazole  40 mg Oral QAC breakfast  . pyridostigmine  30 mg Oral 3 times per day  . sertraline  50 mg Oral q morning - 10a   Continuous Infusions:   Principal Problem:   Inability to swallow Active Problems:   GERD (gastroesophageal reflux disease)   Obesity   Dysphagia   Myasthenia gravis ([place])   Time spent: 25 minutes S Takeshia Wenk Signed:   [facility] If 7PM-7AM, please contact night-coverage at [email or web address], password [facility] 03/23/2015, 11:22 AM  LOS: 3 days

## 2015-03-23 NOTE — Progress Notes (Signed)
NEURO HOSPITALIST PROGRESS NOTE   SUBJECTIVE:                                                                                                                        Frances Medina seems more comfortable today, sitting in a chair at the bedside and stating that she is already feeling better. Had HA with IVIG but was able to complete her first infusion without significant consequences. The left ptosis is less prominent today. Tolerating well a lower dose of mestinon (30 mg TID). Patient gave effort on VC and NIF but was unable to form a seal around mouthpiece and was therefore not able to perform test accurately. Enhanced CT chest unremarkable.  OBJECTIVE:                                                                                                                           Vital signs in last 24 hours: Temp:  [97.9 F (36.6 C)-98.1 F (36.7 C)] 97.9 F (36.6 C) (01/19 2127) Pulse Rate:  [70-86] 70 (01/20 0625) Resp:  [16-20] 18 (01/20 0625) BP: (129-168)/(62-95) 143/95 mmHg (01/20 0625) SpO2:  [94 %-100 %] 97 % (01/20 0625)  Intake/Output from previous day: 01/19 0701 - 01/20 0700 In: 680 [P.O.:680] Out: -  Intake/Output this shift: Total I/O In: 120 [P.O.:120] Out: -  Nutritional status: DIET DYS 2 Room service appropriate?: Yes; Fluid consistency:: Thin  Past Medical History  Diagnosis Date  . GERD (gastroesophageal reflux disease)   . 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 Knee injury   . Depression   . Seasonal allergies     takes Zytrec  . Fibroid uterus     size of a dime  . Umbilical hernia     watching , no plans for surgery at present  . H/O hiatal hernia   . Hypertension     "went away when I stopped smoking"  . Anxiety   . Migraine     "maybe couple times/month" (03/20/2015)  . Arthritis     "knees" (03/20/2015)  . Osteoarthritis of right knee 08/30/2013  . Osteoarthritis of left knee 12/02/2013  . Bell's palsy     affecting  left eye vision  Physical  exam: HEENT- Normocephalic, no lesions, without obvious abnormality. Normal external eye and conjunctiva. Normal TM's bilaterally. Normal auditory canals and external ears. Normal external nose, mucus membranes and septum. Normal pharynx. Cardiovascular- S1, S2 normal, pulses palpable throughout  Lungs- chest clear, no wheezing, rales, normal symmetric air entry Abdomen- normal findings: bowel sounds normal Extremities- no edema Lymph-no adenopathy palpable Musculoskeletal-no joint tenderness, deformity or swelling Skin-warm and dry, no hyperpigmentation, vitiligo, or suspicious    Neurologic Exam:  Mental Status: Alert, oriented, thought content appropriate. Speech dysarthric without evidence of aphasia. Able to follow 3 step commands without difficulty. Cranial Nerves: II: Discs flat bilaterally; Visual fields grossly normal, pupils equal, round, reactive to light and accommodation III,IV, VI: positive ptosis left eye with lid lag after prolonged vertical gaze, extra-ocular motions intact bilaterally V,VII: smile symmetric, facial light touch sensation normal bilaterally VIII: hearing normal bilaterally IX,X: uvula rises symmetrically XI: bilateral shoulder shrug XII: midline tongue extension Motor: Right :Upper extremity 5/5Left: Upper extremity 5/5 Lower extremity 5/5Lower extremity 5/5 --increased weakness with prolonged fist pump Tone and bulk:normal tone throughout; no atrophy noted Sensory: Pinprick and light touch intact throughout, bilaterally Deep Tendon Reflexes: 1+ and symmetric throughout Plantars: Right: downgoingLeft: downgoing Cerebellar: normal finger-to-nose and normal heel-to-shin test Gait: not tested  Lab Results: Lab Results  Component Value Date/Time   CHOL 152 07/21/2013  08:56 AM   Lipid Panel No results for input(s): CHOL, TRIG, HDL, CHOLHDL, VLDL, LDLCALC in the last 72 hours.  Studies/Results: Ct Chest W Contrast  03/22/2015  CLINICAL DATA:  55 year old female with myasthenia gravis. EXAM: CT CHEST WITH CONTRAST TECHNIQUE: Multidetector CT imaging of the chest was performed during intravenous contrast administration. CONTRAST:  20mL OMNIPAQUE IOHEXOL 300 MG/ML  SOLN COMPARISON:  08/19/2013 chest radiograph. FINDINGS: Mediastinum/Nodes: There is no evidence of thymic tissue or thymic mass. No mediastinal mass or enlarged lymph nodes are identified. The heart and great vessels are unremarkable. There is no evidence of pericardial effusion. Lungs/Pleura: Mild bibasilar atelectasis/scarring noted. There is no evidence of mass, nodule, airspace disease or consolidation. No endobronchial or endotracheal lesions are identified. No pleural effusions are present. Upper abdomen: Unremarkable. Musculoskeletal: No acute or significant abnormalities identified. IMPRESSION: No evidence of thymic abnormality or mediastinal mass. Mild basilar atelectasis/scarring. Electronically Signed   By: Margarette Canada M.D.   On: 03/22/2015 21:38   Dg Swallowing Func-speech Pathology  03/22/2015  Objective Swallowing Evaluation:   Patient Details Name: Frances Medina MRN: [id number] Date of Birth: [birth year] Today's Date: 03/22/2015 Time: SLP Start Time (ACUTE ONLY): 0955-SLP Stop Time (ACUTE ONLY): 1014 SLP Time Calculation (min) (ACUTE ONLY): 19 min Past Medical History: Past Medical History Diagnosis Date . GERD (gastroesophageal reflux disease)  . 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 Knee injury  . Depression  . Seasonal allergies    takes Zytrec . Fibroid uterus    size of a dime . Umbilical hernia    watching , no plans for surgery at present . H/O hiatal hernia  . Hypertension    "went away when I stopped smoking" . Anxiety  . Migraine    "maybe couple times/month" (03/20/2015) . Arthritis    "knees"  (03/20/2015) . Osteoarthritis of right knee 08/30/2013 . Osteoarthritis of left knee 12/02/2013 . Bell's palsy    affecting left eye vision Past Surgical History: Past Surgical History Procedure Laterality Date . Cesarean section  1987; 1989 . Dilation and curettage of uterus   . Tubal ligation  1989 . Vaginal  hysterectomy  1990's?   "apparently took out one of my ovaries at the time too cause one's missing" . Partial knee arthroplasty Right 08/30/2013   Procedure: RIGHT UNICOMPARTMENTAL KNEE;  Surgeon: Johnny Bridge, MD;  Location: [facility];  Service: Orthopedics;  Laterality: Right; . Colonoscopy with propofol N/A 09/19/2013   Procedure: COLONOSCOPY WITH PROPOFOL;  Surgeon: Ladene Artist, MD;  Location: [facility];  Service: Endoscopy;  Laterality: N/A; . Partial knee arthroplasty Left 12/02/2013   Procedure: LEFT KNEE UNI ARTHROPLASTY;  Surgeon: Johnny Bridge, MD;  Location: [facility];  Service: Orthopedics;  Laterality: Left; . Esophagogastroduodenoscopy (egd) with propofol N/A 03/21/2015   Procedure: ESOPHAGOGASTRODUODENOSCOPY (EGD) WITH PROPOFOL;  Surgeon: Gatha Mayer, MD;  Location: [facility];  Service: Endoscopy;  Laterality: N/A; HPI: Frances Medina is a 55 y.o. female with a past medical history significant for MO, hiatal hernia, anxiety and recent Bell's palsy who presents with difficulty swallowing and the sensation of something in her throat for 1 week. Subjective: pt describes persistent difficulties swallowing Assessment / Plan / Recommendation CHL IP CLINICAL IMPRESSIONS 03/22/2015 Therapy Diagnosis Mild pharyngeal phase dysphagia;Suspected primary esophageal dysphagia Clinical Impression Pt has c/o generalized oral weakness, although functionally she consumes thin liquids and purees without overt difficulties. A pharyngeal swallow is triggered in a timely manner and with good hyolaryngeal elevation, yet residue remains in the valleculae and pyrifrom sinuses after the swallow.  Boluses enter into the esophagus, but with backflow that carries them back into the pharynx, resulting in flash penetration of puree x1. Pt uses multiple, reflexive swallows to assist with pharyngeal clearance. Barium appeared to remain in the esophagus during brief scan, although MD was not present to confirm. Given the above, continue to suspect a primary esophageal dysphagia with concern for higher sub-UES pressures leading to pharyngeal residue. Recommend to consider additional f/u from GI and/or primary medical team to assess esophageal motility. For now, would recommend a full liquid diet and use of aspiration and esophageal precautions. Impact on safety and function Mild aspiration risk   CHL IP TREATMENT RECOMMENDATION 03/22/2015 Treatment Recommendations Therapy as outlined in treatment plan below   Prognosis 03/22/2015 Prognosis for Safe Diet Advancement Fair Barriers to Reach Goals (No Data) Barriers/Prognosis Comment -- CHL IP DIET RECOMMENDATION 03/22/2015 SLP Diet Recommendations Thin liquid Liquid Administration via Cup Medication Administration Crushed with puree Compensations Slow rate;Small sips/bites;Follow solids with liquid Postural Changes Remain semi-upright after after feeds/meals (Comment);Seated upright at 90 degrees   CHL IP OTHER RECOMMENDATIONS 03/22/2015 Recommended Consults Consider esophageal assessment Oral Care Recommendations Oral care BID Other Recommendations --   CHL IP FOLLOW UP RECOMMENDATIONS 03/22/2015 Follow up Recommendations (No Data)   CHL IP FREQUENCY AND DURATION 03/22/2015 Speech Therapy Frequency (ACUTE ONLY) min 2x/week Treatment Duration 2 weeks      CHL IP ORAL PHASE 03/22/2015 Oral Phase WFL Oral - Pudding Teaspoon -- Oral - Pudding Cup -- Oral - Honey Teaspoon -- Oral - Honey Cup -- Oral - Nectar Teaspoon -- Oral - Nectar Cup -- Oral - Nectar Straw -- Oral - Thin Teaspoon -- Oral - Thin Cup -- Oral - Thin Straw -- Oral - Puree -- Oral - Mech Soft -- Oral - Regular --  Oral - Multi-Consistency -- Oral - Pill -- Oral Phase - Comment --  CHL IP PHARYNGEAL PHASE 03/22/2015 Pharyngeal Phase Impaired Pharyngeal- Pudding Teaspoon -- Pharyngeal -- Pharyngeal- Pudding Cup -- Pharyngeal -- Pharyngeal- Honey Teaspoon -- Pharyngeal -- Pharyngeal- Honey Cup --  Pharyngeal -- Pharyngeal- Nectar Teaspoon -- Pharyngeal -- Pharyngeal- Nectar Cup -- Pharyngeal -- Pharyngeal- Nectar Straw -- Pharyngeal -- Pharyngeal- Thin Teaspoon -- Pharyngeal -- Pharyngeal- Thin Cup Pharyngeal residue - valleculae;Pharyngeal residue - pyriform Pharyngeal -- Pharyngeal- Thin Straw -- Pharyngeal -- Pharyngeal- Puree Pharyngeal residue - valleculae;Pharyngeal residue - pyriform;Pharyngeal residue - posterior pharnyx;Penetration/Apiration after swallow Pharyngeal Material enters airway, remains ABOVE vocal cords then ejected out Pharyngeal- Mechanical Soft -- Pharyngeal -- Pharyngeal- Regular -- Pharyngeal -- Pharyngeal- Multi-consistency -- Pharyngeal -- Pharyngeal- Pill -- Pharyngeal -- Pharyngeal Comment --  CHL IP CERVICAL ESOPHAGEAL PHASE 03/22/2015 Cervical Esophageal Phase Impaired Pudding Teaspoon -- Pudding Cup -- Honey Teaspoon -- Honey Cup -- Nectar Teaspoon -- Nectar Cup -- Nectar Straw -- Thin Teaspoon -- Thin Cup Esophageal backflow into the pharynx Thin Straw -- Puree Esophageal backflow into the pharynx;Esophageal backflow into the larynx Mechanical Soft -- Regular -- Multi-consistency -- Pill -- Cervical Esophageal Comment -- CHL IP GO 03/20/2015 Functional Assessment Tool Used skilled clinical judgment Functional Limitations Swallowing Swallow Current Status [id number]) CK Swallow Goal Status [id number]) [place] Discharge Status [id number]) (None) Motor Speech Current Status [id number]) (None) Motor Speech Goal Status [id number]) (None) Motor Speech Goal Status [id number]) (None) Spoken Language Comprehension Current Status [id number]) (None) Spoken Language Comprehension Goal Status [id number]) (None) Spoken Language Comprehension  Discharge Status [phone]) (None) Spoken Language Expression Current Status [phone]) (None) Spoken Language Expression Goal Status [id number]) (None) Spoken Language Expression Discharge Status [phone]) (None) Attention Current Status [id number]) (None) Attention Goal Status [id number]) (None) Attention Discharge Status [id number]) (None) Memory Current Status [id number]) (None) Memory Goal Status [id number]) (None) Memory Discharge Status [id number]) (None) Voice Current Status [id number]) (None) Voice Goal Status [id number]) (None) Voice Discharge Status [id number]) (None) Other Speech-Language Pathology Functional Limitation [phone]) (None) Other Speech-Language Pathology Functional Limitation Goal Status [id number]) (None) Other Speech-Language Pathology Functional Limitation Discharge Status [phone]) (None) Germain Osgood, M.A. CCC-SLP [phone] Germain Osgood 03/22/2015, 11:21 AM               MEDICATIONS                                                                                                                        Scheduled: . enoxaparin (LOVENOX) injection  0.5 mg/kg Subcutaneous Q24H  . estradiol  0.5 mg Oral Daily  . fluticasone  2 spray Each Nare Daily  . Immune Globulin 10%  400 mg/kg Intravenous Q24 Hr x 5  . pantoprazole  40 mg Oral QAC breakfast  . pyridostigmine  30 mg Oral 3 times per day  . sertraline  50 mg Oral q morning - 10a    ASSESSMENT/PLAN:  55 y/o with a constellation of symptoms and neurological findings consistent with MG. Responding nicely to IVIG and mestinon. Enhanced CT chest unremarkable. Plan: 1) continue IVIG x 4 more days. 2) Mestinon 30 mg TID, will keep at this dose for 2 more days and then rechallehge her with a higher dose. 3) NIF, VC as per protocol Will follow up.  Dorian Pod, MD [facility] [phone]  03/23/2015, 12:27 PM

## 2015-03-24 NOTE — Progress Notes (Signed)
[facility] PROGRESS NOTE  [name] [id number] DOB: [birth year] DOA: 03/19/2015 PCP: Frances Kern., DO   Brief narrative:  55 year old female with reported history of recent Bell'Frances palsy and resultant left eye ptosis-presented with progressively worsening dysphagia. EGD without findings that could explain dysphagia- given  Left eye ptosis - neurology was consulted, now felt to have dysphagia secondary to myasthenia gravis. Started on Mestinon and IVIG with remarkable improvement. Please see below for details  Subjective: States swallowing is much better today, no left eye lid lag  Assessment/Plan: New onset Dysphagia: EGD without significant findings that could explain dysphagia. Given the recent left eye ptosis (attributed to Bell'Frances palsy) and no other explanation for dysphagia-neurology was consulted-per neurology has clinical features consistent with myasthenia gravis. We will hold off on barium esophagogram. Patient successfully responded to Myasthenia treatment, currently no lid lag on left eye and dysphagia has markedly improved.   Myasthenia Gravis: See above-Currently on Pyridostigmine and Immune Globulin x 5 doses. CT Chest ordered by neurology to look for thymoma-no evidence of thymic tissue/mass, mediastinal mass, or enlarged lymph nodes  Anxiety: Continue Sertraline and PRN Clonazepam   Morbid obesity: Continue to hold phendimetrazine   Allergic rhinitis: Continue to hold Zyrtec   Code Status: Full code Family Communication: None at bedside Disposition Plan: Will remain inpatient for total 5-day Immune Globulin treatment   Consultants: GI and Neuro following  Procedures: EGD 1/18  Antibiotics: None  Objective: Filed Vitals:   03/23/15 2100 03/24/15 0611  BP: 143/77 132/86  Pulse: 75 87  Temp: 98.2 F (36.8 C) 98.5 F (36.9 C)  Resp: 18 18    Intake/Output Summary (Last 24 hours) at 03/24/15 1212 Last data filed at 03/23/15 1900  Gross per 24  hour  Intake 561.73 ml  Output      0 ml  Net 561.73 ml   Filed Weights   03/19/15 1629  Weight: 127.461 kg (281 lb)    Exam:   General: Awake and alert, in no acute distress  HEENT: No ptosis or lid lag bilaterally  Cardiovascular: S1, S2 regular, no mumurs  Respiratory: CTA bilaterally  Abdomen: Obese, non-tender  Data Reviewed: Basic Metabolic Panel:  Recent Labs Lab 03/19/15 1659 03/19/15 1706 03/20/15 1820 03/21/15 0615  NA 142 142  --  140  K 4.4 4.4  --  4.0  CL 101 102  --  106  CO2  --  30  --  26  GLUCOSE 91 97  --  83  BUN 6 5*  --  10  CREATININE 0.70 0.74 0.69 0.65  CALCIUM  --  9.7  --  8.7*   Liver Function Tests:  Recent Labs Lab 03/19/15 1706 03/21/15 0615  AST 23 21  ALT 19 15  ALKPHOS 92 75  BILITOT 0.4 0.7  PROT 7.3 6.4*  ALBUMIN 3.8 3.3*   No results for input(Frances): LIPASE, AMYLASE in the last 168 hours. No results for input(Frances): AMMONIA in the last 168 hours. CBC:  Recent Labs Lab 03/19/15 1659 03/19/15 1706 03/20/15 1820 03/21/15 0615  WBC  --  8.7 8.1 6.0  NEUTROABS  --  6.4  --   --   HGB 15.3* 13.2 12.6 12.0  HCT 45.0 41.4 39.3 38.0  MCV  --  74.1* 73.6* 74.2*  PLT  --  185 168 156   Cardiac Enzymes: No results for input(Frances): CKTOTAL, CKMB, CKMBINDEX, TROPONINI in the last 168 hours. BNP (last 3 results) No results for  input(Frances): BNP in the last 8760 hours.  ProBNP (last 3 results) No results for input(Frances): PROBNP in the last 8760 hours.  CBG:  Recent Labs Lab 03/21/15 2045 03/22/15 0813 03/22/15 1210 03/22/15 1701 03/22/15 2125  GLUCAP 79 89 90 99 90    No results found for this or any previous visit (from the past 240 hour(Frances)).   Studies: Ct Chest W Contrast  03/22/2015  CLINICAL DATA:  55 year old female with myasthenia gravis. EXAM: CT CHEST WITH CONTRAST TECHNIQUE: Multidetector CT imaging of the chest was performed during intravenous contrast administration. CONTRAST:  62mL OMNIPAQUE IOHEXOL 300  MG/ML  SOLN COMPARISON:  08/19/2013 chest radiograph. FINDINGS: Mediastinum/Nodes: There is no evidence of thymic tissue or thymic mass. No mediastinal mass or enlarged lymph nodes are identified. The heart and great vessels are unremarkable. There is no evidence of pericardial effusion. Lungs/Pleura: Mild bibasilar atelectasis/scarring noted. There is no evidence of mass, nodule, airspace disease or consolidation. No endobronchial or endotracheal lesions are identified. No pleural effusions are present. Upper abdomen: Unremarkable. Musculoskeletal: No acute or significant abnormalities identified. IMPRESSION: No evidence of thymic abnormality or mediastinal mass. Mild basilar atelectasis/scarring. Electronically Signed   By: Frances Medina M.D.   On: 03/22/2015 21:38    Scheduled Meds: . enoxaparin (LOVENOX) injection  0.5 mg/kg Subcutaneous Q24H  . estradiol  0.5 mg Oral Daily  . fluticasone  2 spray Each Nare Daily  . Immune Globulin 10%  400 mg/kg Intravenous Q24 Hr x 5  . pantoprazole  40 mg Oral QAC breakfast  . pyridostigmine  30 mg Oral 3 times per day  . sertraline  50 mg Oral q morning - 10a   Continuous Infusions:   Principal Problem:   Inability to swallow Active Problems:   GERD (gastroesophageal reflux disease)   Obesity   Dysphagia   Myasthenia gravis ([place])   Time spent: 25 minutes Frances Medina Signed:   [facility] If 7PM-7AM, please contact night-coverage at [email or web address], password [facility] 03/24/2015, 12:12 PM  LOS: 4 days

## 2015-03-24 NOTE — Progress Notes (Signed)
Patient gave great effort with NIF of -30 and vital capacity=.Frances Medina

## 2015-03-24 NOTE — Progress Notes (Signed)
NIF greater that -60, VC 1.3L  Got pt a mouthpiece and she was able to make a seal.  Good effort

## 2015-03-25 LAB — CBC
HCT: 36.6 % (ref 36.0–46.0)
Hemoglobin: 11.6 g/dL — ABNORMAL LOW (ref 12.0–15.0)
MCH: 23.5 pg — ABNORMAL LOW (ref 26.0–34.0)
MCHC: 31.7 g/dL (ref 30.0–36.0)
MCV: 74.2 fL — ABNORMAL LOW (ref 78.0–100.0)
Platelets: 240 10*3/uL (ref 150–400)
RBC: 4.93 MIL/uL (ref 3.87–5.11)
RDW: 16.8 % — ABNORMAL HIGH (ref 11.5–15.5)
WBC: 5 10*3/uL (ref 4.0–10.5)

## 2015-03-25 LAB — BASIC METABOLIC PANEL
Anion gap: 7 (ref 5–15)
BUN: 5 mg/dL — ABNORMAL LOW (ref 6–20)
CO2: 28 mmol/L (ref 22–32)
Calcium: 9 mg/dL (ref 8.9–10.3)
Chloride: 103 mmol/L (ref 101–111)
Creatinine, Ser: 0.74 mg/dL (ref 0.44–1.00)
GFR calc Af Amer: >60 mL/min (ref >60)
GFR calc non Af Amer: >60 mL/min (ref >60)
Glucose, Bld: 85 mg/dL (ref 65–99)
Potassium: 3.7 mmol/L (ref 3.5–5.1)
Sodium: 138 mmol/L (ref 135–145)

## 2015-03-25 MED ORDER — PYRIDOSTIGMINE BROMIDE 60 MG PO TABS
60.0000 mg | ORAL_TABLET | Freq: Three times a day (TID) | ORAL | Status: DC
Start: 2015-03-25 — End: 2015-03-27
  Administered 2015-03-25 – 2015-03-27 (×6): 60 mg via ORAL
  Filled 2015-03-25 (×6): qty 1

## 2015-03-25 NOTE — Progress Notes (Signed)
NEURO HOSPITALIST PROGRESS NOTE   SUBJECTIVE:                                                                                                                        Continuously improving. Feels stronger, can walk without problem, swallowing much better, left eyelid droopiness notably improved. NIF of -30 and vital capacity=.900 IVIG 4/5 days. Mestinon 30 mg TID. Acetylcholine receptor antibodies pending. Enhanced CT chest negative. OBJECTIVE:                                                                                                                           Vital signs in last 24 hours: Temp:  [98 F (36.7 C)-98.9 F (37.2 C)] 98 F (36.7 C) (01/22 0525) Pulse Rate:  [78-94] 91 (01/22 0525) Resp:  [16-20] 18 (01/22 0525) BP: (124-169)/(59-94) 124/74 mmHg (01/22 0525) SpO2:  [95 %-100 %] 95 % (01/22 0525)  Intake/Output from previous day: 01/21 0701 - 01/22 0700 In: 602.9 [P.O.:300; I.V.:302.9] Out: -  Intake/Output this shift:   Nutritional status: Diet clear liquid Room service appropriate?: Yes; Fluid consistency:: Thin  Past Medical History  Diagnosis Date  . GERD (gastroesophageal reflux disease)   . 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 Knee injury   . Depression   . Seasonal allergies     takes Zytrec  . Fibroid uterus     size of a dime  . Umbilical hernia     watching , no plans for surgery at present  . H/O hiatal hernia   . Hypertension     "went away when I stopped smoking"  . Anxiety   . Migraine     "maybe couple times/month" (03/20/2015)  . Arthritis     "knees" (03/20/2015)  . Osteoarthritis of right knee 08/30/2013  . Osteoarthritis of left knee 12/02/2013  . Bell's palsy     affecting left eye vision   Physical exam: HEENT- Normocephalic, no lesions, without obvious abnormality. Normal external eye and conjunctiva. Normal TM's bilaterally. Normal auditory canals and external ears. Normal external nose,  mucus membranes and septum. Normal pharynx. Cardiovascular- S1, S2 normal, pulses palpable throughout  Lungs- chest clear, no wheezing, rales, normal symmetric air entry Abdomen-  normal findings: bowel sounds normal Extremities- no edema Lymph-no adenopathy palpable Musculoskeletal-no joint tenderness, deformity or swelling Skin-warm and dry, no hyperpigmentation, vitiligo, or suspicious   Neurologic Exam:  Mental Status: Alert, oriented, thought content appropriate. Speech dysarthric without evidence of aphasia. Able to follow 3 step commands without difficulty. Cranial Nerves: II: Discs flat bilaterally; Visual fields grossly normal, pupils equal, round, reactive to light and accommodation III,IV, VI: positive ptosis left eye with lid lag after prolonged vertical gaze, extra-ocular motions intact bilaterally V,VII: smile symmetric, facial light touch sensation normal bilaterally VIII: hearing normal bilaterally IX,X: uvula rises symmetrically XI: bilateral shoulder shrug XII: midline tongue extension Motor: Right :Upper extremity 5/5Left: Upper extremity 5/5 Lower extremity 5/5Lower extremity 5/5 --increased weakness with prolonged fist pump Tone and bulk:normal tone throughout; no atrophy noted Sensory: Pinprick and light touch intact throughout, bilaterally Deep Tendon Reflexes: 1+ and symmetric throughout Plantars: Right: downgoingLeft: downgoing Cerebellar: normal finger-to-nose and normal heel-to-shin test Gait: not tested  Lab Results: Lab Results  Component Value Date/Time   CHOL 152 07/21/2013 08:56 AM   Lipid Panel No results for input(s): CHOL, TRIG, HDL, CHOLHDL, VLDL, LDLCALC in the last 72 hours.  Studies/Results: No results found.  MEDICATIONS                                                                                                                         Scheduled: . enoxaparin (LOVENOX) injection  0.5 mg/kg Subcutaneous Q24H  . estradiol  0.5 mg Oral Daily  . fluticasone  2 spray Each Nare Daily  . Immune Globulin 10%  400 mg/kg Intravenous Q24 Hr x 5  . pantoprazole  40 mg Oral QAC breakfast  . pyridostigmine  30 mg Oral 3 times per day  . sertraline  50 mg Oral q morning - 10a    ASSESSMENT/PLAN:                                                                                                            55 y/o with a constellation of symptoms and neurological findings consistent with MG. Responding nicely to IVIG and mestinon. Enhanced CT chest unremarkable. Acetylcholine receptor antibodies pending. Plan: 1) continue IVIG (total 5 days). 2) Will increase Mestinon to 60 mg TID which could be subsequently increased depending on tolerability. 3) NIF, VC as per protocol Will follow up.  Dorian Pod, MD [facility] [phone]  03/25/2015, 10:48 AM

## 2015-03-25 NOTE — Progress Notes (Signed)
[facility] PROGRESS NOTE  BERNETHA DREYFUSS [id number] DOB: [birth year] DOA: 03/19/2015 PCP: [name]., DO   Brief narrative:  55 year old female with reported history of recent Bell's palsy and resultant left eye ptosis-presented with progressively worsening dysphagia. EGD without findings that could explain dysphagia- given  Left eye ptosis - neurology was consulted, now felt to have dysphagia secondary to myasthenia gravis. Started on Mestinon and IVIG with remarkable improvement. Please see below for details  Subjective:  Patient brushing her teeth, denies any headache fever chills, overall weakness and dysphagia improved, continues to have mild left eye ptosis. Overall feels much better.  Assessment/Plan:  New onset Dysphagia: EGD without significant findings that could explain dysphagia. Seen by speech, no clear reason for dysphagia therefore neurology was consulted with new diagnosis of myasthenia gravis. She seems to be responding well to Myasthenia treatment, currently improved L eye lid lag on left eye and dysphagia has markedly improved.   Myasthenia Gravis: See above-Currently on Pyridostigmine and Immune Globulin x 5 doses. CT Chest ordered by neurology to look for thymoma-no evidence of thymic tissue/mass, mediastinal mass, or enlarged lymph nodes. Further management defer to neurology at this time.  Anxiety: Continue Sertraline and PRN Clonazepam   Morbid obesity: Continue to hold phendimetrazine   Allergic rhinitis: Continue to hold Zyrtec   Code Status: Full code Family Communication: None at bedside Disposition Plan: Will remain inpatient for total 5-day Immune Globulin treatment   Consultants: GI and Neuro following  Procedures: EGD 1/18  Antibiotics: None  Objective: Filed Vitals:   03/24/15 2110 03/25/15 0525  BP: 137/59 124/74  Pulse: 82 91  Temp: 98.7 F (37.1 C) 98 F (36.7 C)  Resp: 18 18    Intake/Output Summary (Last 24 hours) at  03/25/15 1016 Last data filed at 03/25/15 0559  Gross per 24 hour  Intake  602.9 ml  Output      0 ml  Net  602.9 ml   Filed Weights   03/19/15 1629  Weight: 127.461 kg (281 lb)    Exam:   General: Awake and alert, in no acute distress  HEENT: No ptosis or lid lag bilaterally  Cardiovascular: S1, S2 regular, no mumurs  Respiratory: CTA bilaterally  Abdomen: Obese, non-tender  Neurological: Continues to have left eye ptosis, no focal deficits  Data Reviewed: Basic Metabolic Panel:  Recent Labs Lab 03/19/15 1659 03/19/15 1706 03/20/15 1820 03/21/15 0615 03/25/15 0600  NA 142 142  --  140 138  K 4.4 4.4  --  4.0 3.7  CL 101 102  --  106 103  CO2  --  30  --  26 28  GLUCOSE 91 97  --  83 85  BUN 6 5*  --  10 5*  CREATININE 0.70 0.74 0.69 0.65 0.74  CALCIUM  --  9.7  --  8.7* 9.0   Liver Function Tests:  Recent Labs Lab 03/19/15 1706 03/21/15 0615  AST 23 21  ALT 19 15  ALKPHOS 92 75  BILITOT 0.4 0.7  PROT 7.3 6.4*  ALBUMIN 3.8 3.3*   No results for input(s): LIPASE, AMYLASE in the last 168 hours. No results for input(s): AMMONIA in the last 168 hours. CBC:  Recent Labs Lab 03/19/15 1659 03/19/15 1706 03/20/15 1820 03/21/15 0615 03/25/15 0600  WBC  --  8.7 8.1 6.0 5.0  NEUTROABS  --  6.4  --   --   --   HGB 15.3* 13.2 12.6 12.0 11.6*  HCT  45.0 41.4 39.3 38.0 36.6  MCV  --  74.1* 73.6* 74.2* 74.2*  PLT  --  185 168 156 240   Cardiac Enzymes: No results for input(s): CKTOTAL, CKMB, CKMBINDEX, TROPONINI in the last 168 hours. BNP (last 3 results) No results for input(s): BNP in the last 8760 hours.  ProBNP (last 3 results) No results for input(s): PROBNP in the last 8760 hours.  CBG:  Recent Labs Lab 03/21/15 2045 03/22/15 0813 03/22/15 1210 03/22/15 1701 03/22/15 2125  GLUCAP 79 89 90 99 90    No results found for this or any previous visit (from the past 240 hour(s)).   Studies: No results found.  Scheduled Meds: .  enoxaparin (LOVENOX) injection  0.5 mg/kg Subcutaneous Q24H  . estradiol  0.5 mg Oral Daily  . fluticasone  2 spray Each Nare Daily  . Immune Globulin 10%  400 mg/kg Intravenous Q24 Hr x 5  . pantoprazole  40 mg Oral QAC breakfast  . pyridostigmine  30 mg Oral 3 times per day  . sertraline  50 mg Oral q morning - 10a   Continuous Infusions:   Principal Problem:   Inability to swallow Active Problems:   GERD (gastroesophageal reflux disease)   Obesity   Dysphagia   Myasthenia gravis (HCC)   Signature  Thurnell Lose M.D on 03/25/2015 at 10:16 AM  Between 7am to 7pm - Pager - [phone], After 7pm go to [email or web address] - password [email or web address]  [phone]  03/25/2015, 10:16 AM  LOS: 5 days

## 2015-03-26 NOTE — Progress Notes (Signed)
[facility] PROGRESS NOTE  Frances Medina [id number] DOB: 05-03-1960 DOA: 03/19/2015 PCP: [name]., DO   Brief narrative:   55 year old female with reported history of recent Bell's palsy and resultant left eye ptosis-presented with progressively worsening dysphagia. EGD without findings that could explain dysphagia- given  Left eye ptosis - neurology was consulted, now felt to have dysphagia secondary to myasthenia gravis. Started on Mestinon and IVIG with remarkable improvement. Please see below for details  Subjective:  Patient brushing her teeth, denies any headache fever chills, overall weakness and dysphagia improved, continues to have mild left eye ptosis. Overall feels much better.  Assessment/Plan:  New onset Dysphagia: EGD without significant findings that could explain dysphagia. Seen by speech, no clear reason for dysphagia therefore neurology was consulted with new diagnosis of myasthenia gravis. She seems to be responding well to Myasthenia treatment, currently improved L eye lid lag on left eye and dysphagia has markedly improved. We'll advanced from liquid diet to soft diet on 03/26/2015 and monitor.  Myasthenia Gravis: See above-Currently on Pyridostigmine now up to 60 mg orally 3 times a day and Immune Globulin x 5 doses last treatment on 03/26/2015. CT Chest ordered by neurology to look for thymoma-no evidence of thymic tissue/mass, mediastinal mass, or enlarged lymph nodes. Further management defer to neurology at this time.  Anxiety: Continue Sertraline and PRN Clonazepam   Morbid obesity: Continue to hold phendimetrazine   Allergic rhinitis: Continue to hold Zyrtec   Code Status: Full code Family Communication: None at bedside Disposition Plan: Will remain inpatient for total 5-day Immune Globulin treatment   Consultants: GI and Neuro following  Procedures: EGD 1/18  Antibiotics: None  Objective: Filed Vitals:   03/25/15 2257 03/26/15 0616   BP: 146/72 135/94  Pulse: 74 109  Temp: 98.1 F (36.7 C) 98 F (36.7 C)  Resp: 18 18    Intake/Output Summary (Last 24 hours) at 03/26/15 1116 Last data filed at 03/26/15 0550  Gross per 24 hour  Intake    340 ml  Output      0 ml  Net    340 ml   Filed Weights   03/19/15 1629  Weight: 127.461 kg (281 lb)    Exam:   General: Awake and alert, in no acute distress  HEENT: No ptosis or lid lag bilaterally  Cardiovascular: S1, S2 regular, no mumurs  Respiratory: CTA bilaterally  Abdomen: Obese, non-tender  Neurological: Continues to have left eye ptosis, no focal deficits  Data Reviewed: Basic Metabolic Panel:  Recent Labs Lab 03/19/15 1659 03/19/15 1706 03/20/15 1820 03/21/15 0615 03/25/15 0600  NA 142 142  --  140 138  K 4.4 4.4  --  4.0 3.7  CL 101 102  --  106 103  CO2  --  30  --  26 28  GLUCOSE 91 97  --  83 85  BUN 6 5*  --  10 5*  CREATININE 0.70 0.74 0.69 0.65 0.74  CALCIUM  --  9.7  --  8.7* 9.0   Liver Function Tests:  Recent Labs Lab 03/19/15 1706 03/21/15 0615  AST 23 21  ALT 19 15  ALKPHOS 92 75  BILITOT 0.4 0.7  PROT 7.3 6.4*  ALBUMIN 3.8 3.3*   No results for input(s): LIPASE, AMYLASE in the last 168 hours. No results for input(s): AMMONIA in the last 168 hours. CBC:  Recent Labs Lab 03/19/15 1659 03/19/15 1706 03/20/15 1820 03/21/15 0615 03/25/15 0600  WBC  --  8.7 8.1 6.0 5.0  NEUTROABS  --  6.4  --   --   --   HGB 15.3* 13.2 12.6 12.0 11.6*  HCT 45.0 41.4 39.3 38.0 36.6  MCV  --  74.1* 73.6* 74.2* 74.2*  PLT  --  185 168 156 240   Cardiac Enzymes: No results for input(s): CKTOTAL, CKMB, CKMBINDEX, TROPONINI in the last 168 hours. BNP (last 3 results) No results for input(s): BNP in the last 8760 hours.  ProBNP (last 3 results) No results for input(s): PROBNP in the last 8760 hours.  CBG:  Recent Labs Lab 03/21/15 2045 03/22/15 0813 03/22/15 1210 03/22/15 1701 03/22/15 2125  GLUCAP 79 89 90 99 90     No results found for this or any previous visit (from the past 240 hour(s)).   Studies: No results found.  Scheduled Meds: . enoxaparin (LOVENOX) injection  0.5 mg/kg Subcutaneous Q24H  . estradiol  0.5 mg Oral Daily  . fluticasone  2 spray Each Nare Daily  . Immune Globulin 10%  400 mg/kg Intravenous Q24 Hr x 5  . pantoprazole  40 mg Oral QAC breakfast  . pyridostigmine  60 mg Oral 3 times per day  . sertraline  50 mg Oral q morning - 10a   Continuous Infusions:   Principal Problem:   Inability to swallow Active Problems:   GERD (gastroesophageal reflux disease)   Obesity   Dysphagia   Myasthenia gravis (HCC)   Signature  Thurnell Lose M.D on 03/26/2015 at 11:16 AM  Between 7am to 7pm - Pager - [phone], After 7pm go to [email or web address] - password [email or web address]  [phone]  03/26/2015, 11:16 AM  LOS: 6 days

## 2015-03-26 NOTE — Progress Notes (Signed)
Speech Language Pathology Treatment: Dysphagia  Patient Details Name: Frances Medina MRN: [id number] DOB: 08/05/1960 Today's Date: 03/26/2015 Time: [id number] SLP Time Calculation (min) (ACUTE ONLY): 32 min  Assessment / Plan / Recommendation Clinical Impression  Pt reports swallow ability to be much improved today.  She denies choking on any intake and reports adequate pharyngeal clearance with liquids and dry swallows.  Pt has been consuming applesauce and few bites of cracker throughout the weekend with good tolerance.    Observed pt swallowing applesauce and gingerale - no s/s of aspiration with intake but sensation of residuals.  SLP advised pt to aspiration precautions/compensation strategies to mitigate dysphagia.  Using teach back and written precautions, education completed.  Reviewed importance of fatigue and MG and need to modify amount of intake prn.  No follow up indicated at this time.  Wrote questions for pt to ask MD prior to discharge re: liquid supplements and GERD.       HPI HPI: Frances Medina is a 55 y.o. female with a past medical history significant for MO, hiatal hernia, anxiety and recent Bell's palsy who presents with difficulty swallowing and the sensation of something in her throat for 1 week.  Eye droop for 1 1/2 month as of 03/26/15.  Pt reports being on and off reflux medications for many years - most recently using water and baking soda due to ineffectiveness of prescription medications.  Pt diagnosed with myasthenia gravis and treated with IVIG.  Pt seen to assess readiness for po advancement.   MD advanced diet to soft today.       SLP Plan  Continue with current plan of care     Recommendations  Liquids provided via: Cup;Straw Medication Administration: Whole meds with liquid Supervision: Patient able to self feed;Intermittent supervision to cue for compensatory strategies Compensations: Slow rate;Small sips/bites;Follow solids with liquid;Multiple dry  swallows after each bite/sip (dry swallow prn) Postural Changes and/or Swallow Maneuvers: Seated upright 90 degrees;Upright 30-60 min after meal             Oral Care Recommendations: Oral care BID Follow up Recommendations: None Plan: Continue with current plan of care     GO           Functional Assessment Tool Used:      Claudie Fisherman, [facility]

## 2015-03-26 NOTE — Progress Notes (Signed)
Patient gave great effort. VC 1.5, NIF greater than -60.

## 2015-03-26 NOTE — Progress Notes (Signed)
Subjective: [place] improved with Mestinon and IVIG.  Able to swallow better. No SE from Mestinon.   Exam: Filed Vitals:   03/25/15 2257 03/26/15 0616  BP: 146/72 135/94  Pulse: 74 109  Temp: 98.1 F (36.7 C) 98 F (36.7 C)  Resp: 18 18        Gen: In bed, NAD MS: alert and oriented, follows commands CN: left eye ptosis, sensation intact, EOMI, vision intact, TML Motor: 5/5 throughout Sensory:grossly intact   Pertinent Labs: MG panel pending   Impression: 55 y/o with a constellation of symptoms and neurological findings consistent with MG. Responding nicely to IVIG and mestinon. Enhanced CT chest unremarkable. Acetylcholine receptor antibodies pending. Plan: 1) continue IVIG --last dose today. 2) Mestinon to 60 mg TID 3) will need out patient follow up with neurology for medication adjustment and EMG/NCV.    Neurology will S/O--discussed with Dr. Prince Rome PA-C [facility] [phone]  03/26/2015, 9:22 AM

## 2015-03-26 NOTE — Progress Notes (Signed)
[facility] PROGRESS NOTE  Frances Medina [id number] DOB: September 10, 1960 DOA: 03/19/2015 PCP: [name]., DO  Brief narrative: Frances Medina is a 55 year old female with reported history of recent Bell's palsy and resultant left eye ptosis-presented with progressively worsening dysphagia. EGD without findings that could explain dysphagia- givenLeft eye ptosis - neurology was consulted, now felt to have dysphagia secondary to myasthenia gravis. Started on Mestinon and IVIG with remarkable improvement. Please see below for details  Subjective: Patient sitting in chair, denies any headache, fever, or chills. Overall weakness and dysphagia improved, continues to have mild left eye ptosis. Overall feels much better.  Assessment/Plan:  New onset Dysphagia: EGD without significant findings that could explain dysphagia. Seen by speech, no clear reason for dysphagia therefore neurology was consulted with new diagnosis of myasthenia gravis. She seems to be responding well to Myasthenia treatment, mildly improved L eye lid lag on left eye and dysphagia has markedly improved. Will advance to soft food diet today.  Myasthenia Gravis: See above-Currently on Pyridostigmine and Immune Globulin x 5 doses- last dose will be given today. CT Chest ordered by neurology to look for thymoma-no evidence of thymic tissue/mass, mediastinal mass, or enlarged lymph nodes. Further management defer to neurology at this time.  Anxiety: Continue Sertraline and PRN Clonazepam   Morbid obesity: Continue to hold phendimetrazine   Allergic rhinitis: Continue to hold Zyrtec   Code Status: Full code Family Communication: None at bedside Disposition Plan: Possible discharge home tomorrow  Consultants:  GI and Neuro following  Procedures:  EGD 1/18  Antibiotics:  None  Objective: Filed Vitals:   03/25/15 2257 03/26/15 0616  BP: 146/72 135/94  Pulse: 74 109  Temp: 98.1 F (36.7 C) 98 F (36.7 C)   Resp: 18 18    Intake/Output Summary (Last 24 hours) at 03/26/15 0915 Last data filed at 03/26/15 0550  Gross per 24 hour  Intake    340 ml  Output      0 ml  Net    340 ml   Filed Weights   03/19/15 1629  Weight: 127.461 kg (281 lb)    Exam:   General: Awake and alert, in no acute distress  Cardiovascular: S1, S2 regular, no m/r/g  Respiratory: CTA bilaterally  Abdomen: Obese, non-tender  Neurological: Continues to have left eye ptosis, no focal deficits  Data Reviewed: Basic Metabolic Panel:  Recent Labs Lab 03/19/15 1659 03/19/15 1706 03/20/15 1820 03/21/15 0615 03/25/15 0600  NA 142 142  --  140 138  K 4.4 4.4  --  4.0 3.7  CL 101 102  --  106 103  CO2  --  30  --  26 28  GLUCOSE 91 97  --  83 85  BUN 6 5*  --  10 5*  CREATININE 0.70 0.74 0.69 0.65 0.74  CALCIUM  --  9.7  --  8.7* 9.0   Liver Function Tests:  Recent Labs Lab 03/19/15 1706 03/21/15 0615  AST 23 21  ALT 19 15  ALKPHOS 92 75  BILITOT 0.4 0.7  PROT 7.3 6.4*  ALBUMIN 3.8 3.3*   No results for input(s): LIPASE, AMYLASE in the last 168 hours. No results for input(s): AMMONIA in the last 168 hours. CBC:  Recent Labs Lab 03/19/15 1659 03/19/15 1706 03/20/15 1820 03/21/15 0615 03/25/15 0600  WBC  --  8.7 8.1 6.0 5.0  NEUTROABS  --  6.4  --   --   --   HGB 15.3* 13.2  12.6 12.0 11.6*  HCT 45.0 41.4 39.3 38.0 36.6  MCV  --  74.1* 73.6* 74.2* 74.2*  PLT  --  185 168 156 240   Cardiac Enzymes: No results for input(s): CKTOTAL, CKMB, CKMBINDEX, TROPONINI in the last 168 hours. BNP (last 3 results) No results for input(s): BNP in the last 8760 hours.  ProBNP (last 3 results) No results for input(s): PROBNP in the last 8760 hours.  CBG:  Recent Labs Lab 03/21/15 2045 03/22/15 0813 03/22/15 1210 03/22/15 1701 03/22/15 2125  GLUCAP 79 89 90 99 90    No results found for this or any previous visit (from the past 240 hour(s)).   Studies: No results  found.  Scheduled Meds: . enoxaparin (LOVENOX) injection  0.5 mg/kg Subcutaneous Q24H  . estradiol  0.5 mg Oral Daily  . fluticasone  2 spray Each Nare Daily  . Immune Globulin 10%  400 mg/kg Intravenous Q24 Hr x 5  . pantoprazole  40 mg Oral QAC breakfast  . pyridostigmine  60 mg Oral 3 times per day  . sertraline  50 mg Oral q morning - 10a   Principal Problem:   Inability to swallow Active Problems:   GERD (gastroesophageal reflux disease)   Obesity   Dysphagia   Myasthenia gravis ([place])  Time spent: Time spent: 30 minutes-Greater than 50% of this time was spent in counseling, explanation of diagnosis, planning of further management, and coordination of care.  I have directly reviewed the clinical findings, lab results and imaging studies. I have interviewed and examined the patient and agree with the documentation and management as recorded by the Physician extender.  Thurnell Lose M.D on 03/26/2015 at 11:17 AM  [facility] Office  [phone]  03/26/2015, 9:15 AM  LOS: 6 days

## 2015-03-26 NOTE — Progress Notes (Signed)
Good pt. Effort NIF -10, VC 1.32

## 2015-03-26 NOTE — Progress Notes (Signed)
MESTINON 60 MG 3 TIMES A DAY PO  COVER- YES  CO-PAY- $ 47.00  TIER- 3 DRUG  PRIOR APPROVAL - NO  PHARMACY ; [facility]   PYRIDOSTIGM 60 MG 3 TIMES A DAY PO  COVER- YES  CO-PAY- $ 16.00  TIER- 1 DRUG  PRIOR APPROVAL - NO  PHARMACY : [facility]

## 2015-03-27 ENCOUNTER — Telehealth: Payer: Self-pay | Admitting: Gastroenterology

## 2015-03-27 LAB — CREATININE, SERUM
Creatinine, Ser: 0.71 mg/dL (ref 0.44–1.00)
GFR calc Af Amer: >60 mL/min (ref >60)
GFR calc non Af Amer: >60 mL/min (ref >60)

## 2015-03-27 LAB — ACETYLCHOLINE RECEPTOR AB, ALL
Acety choline binding ab: 23.8 nmol/L — ABNORMAL HIGH (ref 0.00–0.24)
Acetylchol Block Ab: 49 % — ABNORMAL HIGH (ref 0–25)
Acetylcholine Modulat Ab: 38 % — ABNORMAL HIGH (ref 0–20)

## 2015-03-27 MED ORDER — PYRIDOSTIGMINE BROMIDE 60 MG PO TABS: 60.0000 mg | ORAL_TABLET | Freq: Three times a day (TID) | ORAL | Status: DC

## 2015-03-27 NOTE — Discharge Instructions (Signed)
Follow with Primary MD Colin Benton R., DO in 7 days   Get CBC, CMP, 2 view Chest X ray checked  by Primary MD next visit.    Activity: As tolerated with Full fall precautions use walker/cane & assistance as needed   Disposition Home     Diet:  Soft   with feeding assistance and aspiration precautions.  For Heart failure patients - Check your Weight same time everyday, if you gain over 2 pounds, or you develop in leg swelling, experience more shortness of breath or chest pain, call your Primary MD immediately. Follow Cardiac Low Salt Diet and 1.5 lit/day fluid restriction.   On your next visit with your primary care physician please Get Medicines reviewed and adjusted.   Please request your Prim.MD to go over all Hospital Tests and Procedure/Radiological results at the follow up, please get all Hospital records sent to your Prim MD by signing hospital release before you go home.   If you experience worsening of your admission symptoms, develop shortness of breath, life threatening emergency, suicidal or homicidal thoughts you must seek medical attention immediately by calling 911 or calling your MD immediately  if symptoms less severe.  You Must read complete instructions/literature along with all the possible adverse reactions/side effects for all the Medicines you take and that have been prescribed to you. Take any new Medicines after you have completely understood and accpet all the possible adverse reactions/side effects.   Do not drive, operating heavy machinery, perform activities at heights, swimming or participation in water activities or provide baby sitting services if your were admitted for syncope or siezures until you have seen by Primary MD or a Neurologist and advised to do so again.  Do not drive when taking Pain medications.    Do not take more than prescribed Pain, Sleep and Anxiety Medications  Special Instructions: If you have smoked or chewed Tobacco  in the last  2 yrs please stop smoking, stop any regular Alcohol  and or any Recreational drug use.  Wear Seat belts while driving.   Please note  You were cared for by a hospitalist during your hospital stay. If you have any questions about your discharge medications or the care you received while you were in the hospital after you are discharged, you can call the unit and asked to speak with the hospitalist on call if the hospitalist that took care of you is not available. Once you are discharged, your primary care physician will handle any further medical issues. Please note that NO REFILLS for any discharge medications will be authorized once you are discharged, as it is imperative that you return to your primary care physician (or establish a relationship with a primary care physician if you do not have one) for your aftercare needs so that they can reassess your need for medications and monitor your lab values.

## 2015-03-27 NOTE — Telephone Encounter (Signed)
Left message for patient to call back  

## 2015-03-27 NOTE — Progress Notes (Signed)
Pt was given discharge instructions on Myasthenia Gravis, pyridostigmine tablets, as well as when to call the doctor. RN also explained to the patient the importance of keeping and scheduling her follow-up appointments outpatient. Pt's iv site was removed x1 and prescription given. Pt with no further questions or concern and in no apparent distress at this time. Pt was taken downstairs via wheelchair.

## 2015-03-27 NOTE — Discharge Summary (Signed)
Frances Medina, is a 55 y.o. female  DOB 16-Sep-1960  MRN [id number].  Admission date:  03/19/2015  Admitting Physician  Edwin Dada, MD  Discharge Date:  03/27/2015   Primary MD  [name]., DO  Recommendations for primary care physician for things to follow:   Follow myasthenia gravis panel results which should be back in the next 2-3 days. Needs close outpatient neurology and GI follow-up.   Admission Diagnosis  Dysphagia [R13.10]   Discharge Diagnosis  Dysphagia [R13.10]    Principal Problem:   Inability to swallow Active Problems:   GERD (gastroesophageal reflux disease)   Obesity   Dysphagia   Myasthenia gravis ([place])      Past Medical History  Diagnosis Date  . GERD (gastroesophageal reflux disease)   . 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 Knee injury   . Depression   . Seasonal allergies     takes Zytrec  . Fibroid uterus     size of a dime  . Umbilical hernia     watching , no plans for surgery at present  . H/O hiatal hernia   . Hypertension     "went away when I stopped smoking"  . Anxiety   . Migraine     "maybe couple times/month" (03/20/2015)  . Arthritis     "knees" (03/20/2015)  . Osteoarthritis of right knee 08/30/2013  . Osteoarthritis of left knee 12/02/2013  . Bell's palsy     affecting left eye vision    Past Surgical History  Procedure Laterality Date  . Cesarean section  1987; 1989  . Dilation and curettage of uterus    . Tubal ligation  1989  . Vaginal hysterectomy  1990's?    "apparently took out one of my ovaries at the time too cause one's missing"  . Partial knee arthroplasty Right 08/30/2013    Procedure: RIGHT UNICOMPARTMENTAL KNEE;  Surgeon: Johnny Bridge, MD;  Location: [facility];  Service: Orthopedics;  Laterality: Right;  . Colonoscopy with propofol  N/A 09/19/2013    Procedure: COLONOSCOPY WITH PROPOFOL;  Surgeon: Ladene Artist, MD;  Location: [facility];  Service: Endoscopy;  Laterality: N/A;  . Partial knee arthroplasty Left 12/02/2013    Procedure: LEFT KNEE UNI ARTHROPLASTY;  Surgeon: Johnny Bridge, MD;  Location: [facility];  Service: Orthopedics;  Laterality: Left;  . Esophagogastroduodenoscopy (egd) with propofol N/A 03/21/2015    Procedure: ESOPHAGOGASTRODUODENOSCOPY (EGD) WITH PROPOFOL;  Surgeon: Gatha Mayer, MD;  Location: [facility];  Service: Endoscopy;  Laterality: N/A;       HPI  from the history and physical done on the day of admission:     55 year old female with reported history of recent Bell's palsy and resultant left eye ptosis-presented with progressively worsening dysphagia. EGD without findings that could explain dysphagia- given Left eye ptosis - neurology was consulted, now felt to have dysphagia secondary to myasthenia gravis. Started on Mestinon and IVIG with remarkable improvement. Please see below  for details    Hospital Course:     New onset Dysphagia: EGD without significant findings that could explain dysphagia. Seen by speech, no clear reason for dysphagia therefore neurology was consulted with new diagnosis of myasthenia gravis. She seems to be responding well to Myasthenia treatment, currently improved L eye lid lag on left eye and dysphagia has markedly improved. We'll advanced from liquid diet to soft diet on 03/26/2015 and monitor. Discussed with GI and neuro.  Myasthenia Gravis: Myasthenia gravis panel is still pending, she finished 5 doses of IVIG treatments, placed on Mestinon, case management to arrange. CT Chest ordered by neurology to look for thymoma-no evidence of thymic tissue/mass, mediastinal mass, or enlarged lymph nodes. We will follow with PCP and neurology outpatient postdischarge along with GI for dysphagia.  Anxiety: Continue home regimen.  Morbid obesity:  Continue to hold phendimetrazine   Allergic rhinitis: Continue to hold Zyrtec     Discharge Condition: Fair  Follow UP  Follow-up Information    Follow up with [facility] On 03/29/2015.   Why:  For further evaluation of Myesthenia Gravis - must follow within 5 days. APPT on 04/23/15 @ 12:30 THIS IS A WAITLIST APPT, IF THERE ARE ANY CANCELLATIONS OFFICE WILL CALL THE PATIENTS. NS alert RN of barrier on 03/27/15 @ 10:48am    Contact information:   [place] [phone]      Follow up with [facility]  [facility] In 2 days.   Specialty:  Gastroenterology   Why:  For further evaluation. NS attempted to schedule APPT with receptionist, scheduling RN will call the patient back on the number that is in the system [phone] for APPT. NS alert RN of barrier on 03/27/15 @ 10:48a   Contact information:   [address] [place]  [phone]      Follow up with Colin Benton R., DO. Schedule an appointment as soon as possible for a visit on 03/30/2015.   Specialty:  Family Medicine   Why:  APPT on 03/30/15 @ 3pm. Bring a current list of medications   Contact information:   [place] [address] [phone]        Consults obtained - Neuro, GI  Diet and Activity recommendation: See Discharge Instructions below  Discharge Instructions           Discharge Instructions    Diet - low sodium heart healthy    Complete by:  As directed      Discharge instructions    Complete by:  As directed   Follow with Primary MD Colin Benton R., DO in 7 days   Get CBC, CMP, 2 view Chest X ray checked  by Primary MD next visit.    Activity: As tolerated with Full fall precautions use walker/cane & assistance as needed   Disposition Home     Diet:  Soft   with feeding assistance and aspiration precautions.  For Heart failure patients - Check your Weight same time  everyday, if you gain over 2 pounds, or you develop in leg swelling, experience more shortness of breath or chest pain, call your Primary MD immediately. Follow Cardiac Low Salt Diet and 1.5 lit/day fluid restriction.   On your next visit with your primary care physician please Get Medicines reviewed and adjusted.   Please request your Prim.MD to go over all Hospital Tests and Procedure/Radiological results at the follow up, please get all Hospital records  sent to your Prim MD by signing hospital release before you go home.   If you experience worsening of your admission symptoms, develop shortness of breath, life threatening emergency, suicidal or homicidal thoughts you must seek medical attention immediately by calling 911 or calling your MD immediately  if symptoms less severe.  You Must read complete instructions/literature along with all the possible adverse reactions/side effects for all the Medicines you take and that have been prescribed to you. Take any new Medicines after you have completely understood and accpet all the possible adverse reactions/side effects.   Do not drive, operating heavy machinery, perform activities at heights, swimming or participation in water activities or provide baby sitting services if your were admitted for syncope or siezures until you have seen by Primary MD or a Neurologist and advised to do so again.  Do not drive when taking Pain medications.    Do not take more than prescribed Pain, Sleep and Anxiety Medications  Special Instructions: If you have smoked or chewed Tobacco  in the last 2 yrs please stop smoking, stop any regular Alcohol  and or any Recreational drug use.  Wear Seat belts while driving.   Please note  You were cared for by a hospitalist during your hospital stay. If you have any questions about your discharge medications or the care you received while you were in the hospital after you are discharged, you can call the unit and  asked to speak with the hospitalist on call if the hospitalist that took care of you is not available. Once you are discharged, your primary care physician will handle any further medical issues. Please note that NO REFILLS for any discharge medications will be authorized once you are discharged, as it is imperative that you return to your primary care physician (or establish a relationship with a primary care physician if you do not have one) for your aftercare needs so that they can reassess your need for medications and monitor your lab values.     Increase activity slowly    Complete by:  As directed              Discharge Medications       Medication List    TAKE these medications        cetirizine 10 MG tablet  Commonly known as:  ZYRTEC  Take 10 mg by mouth daily as needed for allergies.     clonazePAM 1 MG tablet  Commonly known as:  KLONOPIN  Take 0.5-1 mg by mouth 2 (two) times daily as needed for anxiety.     estradiol 0.5 MG tablet  Commonly known as:  ESTRACE  Take 0.5 mg by mouth daily.     NOREL AD 4-10-325 MG Tabs  Generic drug:  Chlorphen-PE-Acetaminophen  Take 1 tablet by mouth 2 (two) times daily as needed (FOR SINUSES).     Phendimetrazine Tartrate 35 MG Tabs  Take 35 mg by mouth daily.     pyridostigmine 60 MG tablet  Commonly known as:  MESTINON  Take 1 tablet (60 mg total) by mouth every 8 (eight) hours. Generic OK     sertraline 100 MG tablet  Commonly known as:  ZOLOFT  Take 50 mg by mouth every morning.        Major procedures and Radiology Reports - PLEASE review detailed and final reports for all details, in brief -   EGD 1/18   Ct Head Wo Contrast  03/19/2015  CLINICAL DATA:  Dysphagia and left eye droop for 1 month. EXAM: CT HEAD WITHOUT CONTRAST TECHNIQUE: Contiguous axial images were obtained from the base of the skull through the vertex without intravenous contrast. COMPARISON:  None. FINDINGS: The ventricles are normal in size and  configuration. No extra-axial fluid collections are identified. The gray-white differentiation is normal. No CT findings for acute intracranial process such as hemorrhage or infarction. No mass lesions. The brainstem and cerebellum are grossly normal. The bony structures are intact. The paranasal sinuses and mastoid air cells are clear. The globes are intact. IMPRESSION: Normal head CT. Electronically Signed   By: Marijo Sanes M.D.   On: 03/19/2015 18:28   Ct Soft Tissue Neck W Contrast  03/20/2015  CLINICAL DATA:  55 year old female with history of Bell's palsy presenting with dysphagia and difficulty swallowing. EXAM: CT NECK WITH CONTRAST TECHNIQUE: Multidetector CT imaging of the neck was performed using the standard protocol following the bolus administration of intravenous contrast. CONTRAST:  28mL OMNIPAQUE IOHEXOL 300 MG/ML  SOLN COMPARISON:  None FINDINGS: The visualized lung apices are clear. The trachea and central airways are patent. The esophagus is grossly unremarkable. No thyroid nodule identified. The visualized carotid arteries appear patent. Minimal noncalcified plaque noted along the proximal right ICA. The parotid, and submandibular glands appear unremarkable. There is no adenopathy. There is mild degenerative changes of the spine. No acute fracture. The visualized paranasal sinuses and mastoid air cells are clear. No retropharyngeal fluid collection or abscess identified. IMPRESSION: No acute neck pathology, specifically no findings to explain patient's dysphagia. Electronically Signed   By: Anner Crete M.D.   On: 03/20/2015 01:23   Ct Chest W Contrast  03/22/2015  CLINICAL DATA:  55 year old female with myasthenia gravis. EXAM: CT CHEST WITH CONTRAST TECHNIQUE: Multidetector CT imaging of the chest was performed during intravenous contrast administration. CONTRAST:  64mL OMNIPAQUE IOHEXOL 300 MG/ML  SOLN COMPARISON:  08/19/2013 chest radiograph. FINDINGS: Mediastinum/Nodes: There is  no evidence of thymic tissue or thymic mass. No mediastinal mass or enlarged lymph nodes are identified. The heart and great vessels are unremarkable. There is no evidence of pericardial effusion. Lungs/Pleura: Mild bibasilar atelectasis/scarring noted. There is no evidence of mass, nodule, airspace disease or consolidation. No endobronchial or endotracheal lesions are identified. No pleural effusions are present. Upper abdomen: Unremarkable. Musculoskeletal: No acute or significant abnormalities identified. IMPRESSION: No evidence of thymic abnormality or mediastinal mass. Mild basilar atelectasis/scarring. Electronically Signed   By: Margarette Canada M.D.   On: 03/22/2015 21:38   Mr Brain Wo Contrast  03/20/2015  CLINICAL DATA:  Slurred speech and difficulty speaking attributed to dry mouth. History of Bell's palsy, migraine, hypertension and tumors. EXAM: MRI HEAD WITHOUT CONTRAST TECHNIQUE: Multiplanar, multiecho pulse sequences of the brain and surrounding structures were obtained without intravenous contrast. COMPARISON:  CT head January 16th 2017 FINDINGS: The ventricles and sulci are normal for patient's age. A few scattered subcentimeter supratentorial white matter FLAIR T2 hyperintensities noted. No abnormal parenchymal signal, mass lesions, mass effect. No reduced diffusion to suggest acute ischemia. No susceptibility artifact to suggest hemorrhage. No abnormal extra-axial fluid collections. No extra-axial masses though, contrast enhanced sequences would be more sensitive. Normal major intracranial vascular flow voids seen at the skull base. Ocular globes and orbital contents are unremarkable though not tailored for evaluation. Mildly expanded partial empty sella. No suspicious calvarial bone marrow signal. Craniocervical junction maintained. Visualized paranasal sinuses and mastoid air cells are well-aerated. IMPRESSION: No acute intracranial process. Mild white matter changes, most  often seen with chronic  small vessel ischemic disease. Partially empty sella. Electronically Signed   By: Elon Alas M.D.   On: 03/20/2015 02:53   Dg Swallowing Func-speech Pathology  03/22/2015  Objective Swallowing Evaluation:   Patient Details Name: WANDY READ MRN: [id number] Date of Birth: Aug 26, 1960 Today's Date: 03/22/2015 Time: SLP Start Time (ACUTE ONLY): 0955-SLP Stop Time (ACUTE ONLY): 1014 SLP Time Calculation (min) (ACUTE ONLY): 19 min Past Medical History: Past Medical History Diagnosis Date . GERD (gastroesophageal reflux disease)  . 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 Knee injury  . Depression  . Seasonal allergies    takes Zytrec . Fibroid uterus    size of a dime . Umbilical hernia    watching , no plans for surgery at present . H/O hiatal hernia  . Hypertension    "went away when I stopped smoking" . Anxiety  . Migraine    "maybe couple times/month" (03/20/2015) . Arthritis    "knees" (03/20/2015) . Osteoarthritis of right knee 08/30/2013 . Osteoarthritis of left knee 12/02/2013 . Bell's palsy    affecting left eye vision Past Surgical History: Past Surgical History Procedure Laterality Date . Cesarean section  1987; 1989 . Dilation and curettage of uterus   . Tubal ligation  1989 . Vaginal hysterectomy  1990's?   "apparently took out one of my ovaries at the time too cause one's missing" . Partial knee arthroplasty Right 08/30/2013   Procedure: RIGHT UNICOMPARTMENTAL KNEE;  Surgeon: Johnny Bridge, MD;  Location: [facility];  Service: Orthopedics;  Laterality: Right; . Colonoscopy with propofol N/A 09/19/2013   Procedure: COLONOSCOPY WITH PROPOFOL;  Surgeon: Ladene Artist, MD;  Location: [facility];  Service: Endoscopy;  Laterality: N/A; . Partial knee arthroplasty Left 12/02/2013   Procedure: LEFT KNEE UNI ARTHROPLASTY;  Surgeon: Johnny Bridge, MD;  Location: [facility];  Service: Orthopedics;  Laterality: Left; . Esophagogastroduodenoscopy (egd) with propofol N/A 03/21/2015   Procedure:  ESOPHAGOGASTRODUODENOSCOPY (EGD) WITH PROPOFOL;  Surgeon: Gatha Mayer, MD;  Location: [facility];  Service: Endoscopy;  Laterality: N/A; HPI: YANILET MOW is a 55 y.o. female with a past medical history significant for MO, hiatal hernia, anxiety and recent Bell's palsy who presents with difficulty swallowing and the sensation of something in her throat for 1 week. Subjective: pt describes persistent difficulties swallowing Assessment / Plan / Recommendation CHL IP CLINICAL IMPRESSIONS 03/22/2015 Therapy Diagnosis Mild pharyngeal phase dysphagia;Suspected primary esophageal dysphagia Clinical Impression Pt has c/o generalized oral weakness, although functionally she consumes thin liquids and purees without overt difficulties. A pharyngeal swallow is triggered in a timely manner and with good hyolaryngeal elevation, yet residue remains in the valleculae and pyrifrom sinuses after the swallow. Boluses enter into the esophagus, but with backflow that carries them back into the pharynx, resulting in flash penetration of puree x1. Pt uses multiple, reflexive swallows to assist with pharyngeal clearance. Barium appeared to remain in the esophagus during brief scan, although MD was not present to confirm. Given the above, continue to suspect a primary esophageal dysphagia with concern for higher sub-UES pressures leading to pharyngeal residue. Recommend to consider additional f/u from GI and/or primary medical team to assess esophageal motility. For now, would recommend a full liquid diet and use of aspiration and esophageal precautions. Impact on safety and function Mild aspiration risk   CHL IP TREATMENT RECOMMENDATION 03/22/2015 Treatment Recommendations Therapy as outlined in treatment plan below   Prognosis 03/22/2015 Prognosis for Safe  Diet Advancement Fair Barriers to Reach Goals (No Data) Barriers/Prognosis Comment -- CHL IP DIET RECOMMENDATION 03/22/2015 SLP Diet Recommendations Thin liquid Liquid Administration  via Cup Medication Administration Crushed with puree Compensations Slow rate;Small sips/bites;Follow solids with liquid Postural Changes Remain semi-upright after after feeds/meals (Comment);Seated upright at 90 degrees   CHL IP OTHER RECOMMENDATIONS 03/22/2015 Recommended Consults Consider esophageal assessment Oral Care Recommendations Oral care BID Other Recommendations --   CHL IP FOLLOW UP RECOMMENDATIONS 03/22/2015 Follow up Recommendations (No Data)   CHL IP FREQUENCY AND DURATION 03/22/2015 Speech Therapy Frequency (ACUTE ONLY) min 2x/week Treatment Duration 2 weeks      CHL IP ORAL PHASE 03/22/2015 Oral Phase WFL Oral - Pudding Teaspoon -- Oral - Pudding Cup -- Oral - Honey Teaspoon -- Oral - Honey Cup -- Oral - Nectar Teaspoon -- Oral - Nectar Cup -- Oral - Nectar Straw -- Oral - Thin Teaspoon -- Oral - Thin Cup -- Oral - Thin Straw -- Oral - Puree -- Oral - Mech Soft -- Oral - Regular -- Oral - Multi-Consistency -- Oral - Pill -- Oral Phase - Comment --  CHL IP PHARYNGEAL PHASE 03/22/2015 Pharyngeal Phase Impaired Pharyngeal- Pudding Teaspoon -- Pharyngeal -- Pharyngeal- Pudding Cup -- Pharyngeal -- Pharyngeal- Honey Teaspoon -- Pharyngeal -- Pharyngeal- Honey Cup -- Pharyngeal -- Pharyngeal- Nectar Teaspoon -- Pharyngeal -- Pharyngeal- Nectar Cup -- Pharyngeal -- Pharyngeal- Nectar Straw -- Pharyngeal -- Pharyngeal- Thin Teaspoon -- Pharyngeal -- Pharyngeal- Thin Cup Pharyngeal residue - valleculae;Pharyngeal residue - pyriform Pharyngeal -- Pharyngeal- Thin Straw -- Pharyngeal -- Pharyngeal- Puree Pharyngeal residue - valleculae;Pharyngeal residue - pyriform;Pharyngeal residue - posterior pharnyx;Penetration/Apiration after swallow Pharyngeal Material enters airway, remains ABOVE vocal cords then ejected out Pharyngeal- Mechanical Soft -- Pharyngeal -- Pharyngeal- Regular -- Pharyngeal -- Pharyngeal- Multi-consistency -- Pharyngeal -- Pharyngeal- Pill -- Pharyngeal -- Pharyngeal Comment --  CHL IP CERVICAL  ESOPHAGEAL PHASE 03/22/2015 Cervical Esophageal Phase Impaired Pudding Teaspoon -- Pudding Cup -- Honey Teaspoon -- Honey Cup -- Nectar Teaspoon -- Nectar Cup -- Nectar Straw -- Thin Teaspoon -- Thin Cup Esophageal backflow into the pharynx Thin Straw -- Puree Esophageal backflow into the pharynx;Esophageal backflow into the larynx Mechanical Soft -- Regular -- Multi-consistency -- Pill -- Cervical Esophageal Comment -- CHL IP GO 03/20/2015 Functional Assessment Tool Used skilled clinical judgment Functional Limitations Swallowing Swallow Current Status [id number]) CK Swallow Goal Status [id number]) [place] Discharge Status [id number]) (None) Motor Speech Current Status [id number]) (None) Motor Speech Goal Status [id number]) (None) Motor Speech Goal Status [id number]) (None) Spoken Language Comprehension Current Status [id number]) (None) Spoken Language Comprehension Goal Status [id number]) (None) Spoken Language Comprehension Discharge Status [phone]) (None) Spoken Language Expression Current Status [phone]) (None) Spoken Language Expression Goal Status [id number]) (None) Spoken Language Expression Discharge Status [phone]) (None) Attention Current Status LV:671222) (None) Attention Goal Status [id number]) (None) Attention Discharge Status [id number]:2303441) (None) Memory Current Status AE:130515) (None) Memory Goal Status GI:463060) (None) Memory Discharge Status [id number]:5226335) (None) Voice Current Status [id number]) (None) Voice Goal Status [id number]) (None) Voice Discharge Status [id number]) (None) Other Speech-Language Pathology Functional Limitation [phone]) (None) Other Speech-Language Pathology Functional Limitation Goal Status [id number]) (None) Other Speech-Language Pathology Functional Limitation Discharge Status [phone]) (None) Germain Osgood, M.A. CCC-SLP [phone] Germain Osgood 03/22/2015, 11:21 AM               Micro Results      No results found for this or any previous visit (from the past 240 hour(s)).     Today   Subjective  Dante Gang today has no  headache,no chest abdominal pain,no new weakness tingling or numbness, feels much better wants to go home today.     Objective   Blood pressure 119/81, pulse 96, temperature 97.7 F (36.5 C), temperature source Oral, resp. rate 18, height 5' (1.524 m), weight 127.461 kg (281 lb), SpO2 99 %.  No intake or output data in the 24 hours ending 03/27/15 1121  Exam Awake Alert, Oriented x 3, No new F.N deficits, Normal affect [place].AT,PERRAL, L ptosis Supple Neck,No JVD, No cervical lymphadenopathy appriciated.  Symmetrical Chest wall movement, Good air movement bilaterally, CTAB RRR,No Gallops,Rubs or new Murmurs, No Parasternal Heave +ve B.Sounds, Abd Soft, Non tender, No organomegaly appriciated, No rebound -guarding or rigidity. No Cyanosis, Clubbing or edema, No new Rash or bruise   Data Review   CBC w Diff:  Lab Results  Component Value Date   WBC 5.0 03/25/2015   HGB 11.6* 03/25/2015   HCT 36.6 03/25/2015   PLT 240 03/25/2015   LYMPHOPCT 22 03/19/2015   MONOPCT 4 03/19/2015   EOSPCT 0 03/19/2015   BASOPCT 0 03/19/2015    CMP:  Lab Results  Component Value Date   NA 138 03/25/2015   K 3.7 03/25/2015   CL 103 03/25/2015   CO2 28 03/25/2015   BUN 5* 03/25/2015   CREATININE 0.71 03/27/2015   PROT 6.4* 03/21/2015   ALBUMIN 3.3* 03/21/2015   BILITOT 0.7 03/21/2015   ALKPHOS 75 03/21/2015   AST 21 03/21/2015   ALT 15 03/21/2015  .   Total Time in preparing paper work, data evaluation and todays exam - 35 minutes  Thurnell Lose M.D on 03/27/2015 at 11:21 AM  [facility]   Office  [phone]

## 2015-03-27 NOTE — Telephone Encounter (Signed)
She does not need to see [place] at all for this New dx myasthenia gravis and responding to Tx PCP or neuro can send her back as needed

## 2015-03-27 NOTE — Telephone Encounter (Signed)
Dr. Carlean Purl you saw this patient in the hospital for dysphagia.  They are wanting her to follow up in 2 days.  Myasthenia gravis panel is still pending.  You signed off in 03/22/15.  Do you know of any reason she needs to be seen so urgently?

## 2015-03-27 NOTE — Care Management Note (Signed)
Case Management Note  Patient Details  Name: Frances Medina MRN: [id number] Date of Birth: [birth year]  Subjective/Objective:          Date- 03-22-15 Initial Assessment Spoke with patient at the bedside.  Introduced self as Tourist information centre manager and explained role in discharge planning and how to be reached.  Verified patient lives in [place] with husband and children..  Verified patient anticipates to go home with spouse and family at time of discharge and will have full time supervision.  Patient has DME cane that she uses at times because she has had knee surgery in the past. Expressed potential need for no other DME.  Patient denied needing help with their medication.  Patient drives to MD appointments.  Verified patient has PCP [facility]. Patient states they currently receive [place] services through no one.   Admission Comments: Independent patient admitted from home for dysphagia. Had esophogeal stretching done yesterday. Continues to c/o not being able to swallow medications with applesauce. Patient received 5 days of IVIG for presumed Myasthenia Gravis.   Carles Collet RN BSN CM [phone]           Action/Plan:  DC to home today with [facility] through [facility].  Expected Discharge Date:                  Expected Discharge Plan:  [place]  In-House Referral:     Discharge planning Services  CM Consult  Post Acute Care Choice:  Home Health Choice offered to:  Patient  DME Arranged:    DME Agency:     HH Arranged:  RN, PT, Speech Therapy HH Agency:  [facility]  Status of Service:  Completed, signed off  Medicare Important Message Given:    Date Medicare IM Given:    Medicare IM give by:    Date Additional Medicare IM Given:    Additional Medicare Important Message give by:     If discussed at [place] of Stay Meetings, dates discussed:    Additional Comments:  Carles Collet, RN 03/27/2015, 12:00 PM

## 2015-03-28 NOTE — Telephone Encounter (Signed)
Patient advised.  She will call back if she has new sysmptoms

## 2015-04-02 ENCOUNTER — Encounter: Payer: BC Managed Care – PPO | Admitting: Family Medicine

## 2015-04-02 NOTE — Progress Notes (Signed)
This encounter was created in error - please disregard.

## 2015-04-05 ENCOUNTER — Encounter (HOSPITAL_COMMUNITY): Payer: Self-pay | Admitting: Cardiology

## 2015-04-05 ENCOUNTER — Emergency Department (HOSPITAL_COMMUNITY)
Admission: EM | Admit: 2015-04-05 | Discharge: 2015-04-05 | Disposition: A | Discharge status: Home / Self Care | Payer: BC Managed Care – PPO | Attending: Emergency Medicine | Admitting: Emergency Medicine

## 2015-04-05 ENCOUNTER — Emergency Department (HOSPITAL_COMMUNITY): Payer: BC Managed Care – PPO

## 2015-04-05 DIAGNOSIS — Z87828 Personal history of other (healed) physical injury and trauma: Secondary | ICD-10-CM | POA: Insufficient documentation

## 2015-04-05 DIAGNOSIS — Z87891 Personal history of nicotine dependence: Secondary | ICD-10-CM | POA: Diagnosis not present

## 2015-04-05 DIAGNOSIS — I10 Essential (primary) hypertension: Secondary | ICD-10-CM | POA: Insufficient documentation

## 2015-04-05 DIAGNOSIS — F419 Anxiety disorder, unspecified: Secondary | ICD-10-CM | POA: Diagnosis not present

## 2015-04-05 DIAGNOSIS — Z880 Allergy status to penicillin: Secondary | ICD-10-CM | POA: Diagnosis not present

## 2015-04-05 DIAGNOSIS — Z8719 Personal history of other diseases of the digestive system: Secondary | ICD-10-CM | POA: Diagnosis not present

## 2015-04-05 DIAGNOSIS — R300 Dysuria: Secondary | ICD-10-CM | POA: Diagnosis present

## 2015-04-05 DIAGNOSIS — Z86018 Personal history of other benign neoplasm: Secondary | ICD-10-CM | POA: Insufficient documentation

## 2015-04-05 DIAGNOSIS — F329 Major depressive disorder, single episode, unspecified: Secondary | ICD-10-CM | POA: Diagnosis not present

## 2015-04-05 DIAGNOSIS — G43909 Migraine, unspecified, not intractable, without status migrainosus: Secondary | ICD-10-CM | POA: Insufficient documentation

## 2015-04-05 DIAGNOSIS — N390 Urinary tract infection, site not specified: Secondary | ICD-10-CM | POA: Insufficient documentation

## 2015-04-05 DIAGNOSIS — Z79899 Other long term (current) drug therapy: Secondary | ICD-10-CM | POA: Diagnosis not present

## 2015-04-05 DIAGNOSIS — R42 Dizziness and giddiness: Secondary | ICD-10-CM | POA: Insufficient documentation

## 2015-04-05 DIAGNOSIS — Z8739 Personal history of other diseases of the musculoskeletal system and connective tissue: Secondary | ICD-10-CM | POA: Diagnosis not present

## 2015-04-05 DIAGNOSIS — R531 Weakness: Secondary | ICD-10-CM | POA: Insufficient documentation

## 2015-04-05 LAB — COMPREHENSIVE METABOLIC PANEL
ALT: 46 U/L (ref 14–54)
AST: 59 U/L — ABNORMAL HIGH (ref 15–41)
Albumin: 3.2 g/dL — ABNORMAL LOW (ref 3.5–5.0)
Alkaline Phosphatase: 64 U/L (ref 38–126)
Anion gap: 11 (ref 5–15)
BUN: 9 mg/dL (ref 6–20)
CO2: 23 mmol/L (ref 22–32)
Calcium: 8.5 mg/dL — ABNORMAL LOW (ref 8.9–10.3)
Chloride: 101 mmol/L (ref 101–111)
Creatinine, Ser: 0.7 mg/dL (ref 0.44–1.00)
GFR calc Af Amer: >60 mL/min (ref >60)
GFR calc non Af Amer: >60 mL/min (ref >60)
Glucose, Bld: 99 mg/dL (ref 65–99)
Potassium: 3.8 mmol/L (ref 3.5–5.1)
Sodium: 135 mmol/L (ref 135–145)
Total Bilirubin: 1 mg/dL (ref 0.3–1.2)
Total Protein: 7.9 g/dL (ref 6.5–8.1)

## 2015-04-05 LAB — CBC WITH DIFFERENTIAL/PLATELET
Basophils Absolute: 0 10*3/uL (ref 0.0–0.1)
Basophils Relative: 0 %
Eosinophils Absolute: 0 10*3/uL (ref 0.0–0.7)
Eosinophils Relative: 0 %
HCT: 30 % — ABNORMAL LOW (ref 36.0–46.0)
Hemoglobin: 9.6 g/dL — ABNORMAL LOW (ref 12.0–15.0)
Lymphocytes Relative: 17 %
Lymphs Abs: 0.7 10*3/uL (ref 0.7–4.0)
MCH: 24.2 pg — ABNORMAL LOW (ref 26.0–34.0)
MCHC: 32 g/dL (ref 30.0–36.0)
MCV: 75.8 fL — ABNORMAL LOW (ref 78.0–100.0)
Monocytes Absolute: 0.3 10*3/uL (ref 0.1–1.0)
Monocytes Relative: 7 %
Neutro Abs: 2.9 10*3/uL (ref 1.7–7.7)
Neutrophils Relative %: 76 %
Platelets: 243 10*3/uL (ref 150–400)
RBC: 3.96 MIL/uL (ref 3.87–5.11)
RDW: 20.4 % — ABNORMAL HIGH (ref 11.5–15.5)
WBC: 3.8 10*3/uL — ABNORMAL LOW (ref 4.0–10.5)

## 2015-04-05 LAB — URINALYSIS, ROUTINE W REFLEX MICROSCOPIC
Bilirubin Urine: NEGATIVE
Glucose, UA: NEGATIVE mg/dL
Hgb urine dipstick: NEGATIVE
Ketones, ur: 15 mg/dL — AB
Nitrite: POSITIVE — AB
Protein, ur: 30 mg/dL — AB
Specific Gravity, Urine: 1.021 (ref 1.005–1.030)
pH: 6 (ref 5.0–8.0)

## 2015-04-05 LAB — URINE MICROSCOPIC-ADD ON: RBC / HPF: NONE SEEN RBC/hpf (ref 0–5)

## 2015-04-05 LAB — I-STAT CG4 LACTIC ACID, ED: Lactic Acid, Venous: 0.82 mmol/L (ref 0.5–2.0)

## 2015-04-05 MED ORDER — CIPROFLOXACIN IN D5W 400 MG/200ML IV SOLN
400.0000 mg | Freq: Once | INTRAVENOUS | Status: AC
  Administered 2015-04-05: 400 mg via INTRAVENOUS
  Filled 2015-04-05: qty 200

## 2015-04-05 MED ORDER — SODIUM CHLORIDE 0.9 % IV BOLUS (SEPSIS)
1000.0000 mL | Freq: Once | INTRAVENOUS | Status: AC
  Administered 2015-04-05: 1000 mL via INTRAVENOUS

## 2015-04-05 MED ORDER — CIPROFLOXACIN HCL 500 MG PO TABS: 500.0000 mg | ORAL_TABLET | Freq: Two times a day (BID) | ORAL | Status: DC

## 2015-04-05 MED ORDER — ACETAMINOPHEN 325 MG PO TABS
650.0000 mg | ORAL_TABLET | Freq: Once | ORAL | Status: AC
  Administered 2015-04-05: 650 mg via ORAL
  Filled 2015-04-05: qty 2

## 2015-04-05 NOTE — ED Provider Notes (Signed)
CSN: [id number]     Arrival date & time 04/05/15  1038 History   First MD Initiated Contact with Patient 04/05/15 1043     Chief Complaint  Patient presents with  . Dizziness     (Consider location/radiation/quality/duration/timing/severity/associated sxs/prior Treatment) HPI Patient presents to the emergency department with generalized weakness with dizziness and body aches.  Patient states that she started feeling bad yesterday.  She also states she has had lower back discomfort and dysuria that started 2 days ago.  Patient, states she has had some chills as well.  Patient denies chest pain, shortness of breath, fever, headache, blurred vision, neck pain, rash, abdominal pain, sore throat, incontinence, bloody stool, hematemesis, edema, anorexia, nausea, vomiting, or syncope.  Patient states she did not take any medications prior to arrival.  Patient denies any alleviating or worsening factors Past Medical History  Diagnosis Date  . GERD (gastroesophageal reflux disease)   . 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 Knee injury   . Depression   . Seasonal allergies     takes Zytrec  . Fibroid uterus     size of a dime  . Umbilical hernia     watching , no plans for surgery at present  . H/O hiatal hernia   . Hypertension     "went away when I stopped smoking"  . Anxiety   . Migraine     "maybe couple times/month" (03/20/2015)  . Arthritis     "knees" (03/20/2015)  . Osteoarthritis of right knee 08/30/2013  . Osteoarthritis of left knee 12/02/2013  . Bell's palsy     affecting left eye vision   Past Surgical History  Procedure Laterality Date  . Cesarean section  1987; 1989  . Dilation and curettage of uterus    . Tubal ligation  1989  . Vaginal hysterectomy  1990's?    "apparently took out one of my ovaries at the time too cause one's missing"  . Partial knee arthroplasty Right 08/30/2013    Procedure: RIGHT UNICOMPARTMENTAL KNEE;  Surgeon: Johnny Bridge, MD;  Location: [facility];   Service: Orthopedics;  Laterality: Right;  . Colonoscopy with propofol N/A 09/19/2013    Procedure: COLONOSCOPY WITH PROPOFOL;  Surgeon: Ladene Artist, MD;  Location: [facility];  Service: Endoscopy;  Laterality: N/A;  . Partial knee arthroplasty Left 12/02/2013    Procedure: LEFT KNEE UNI ARTHROPLASTY;  Surgeon: Johnny Bridge, MD;  Location: [facility];  Service: Orthopedics;  Laterality: Left;  . Esophagogastroduodenoscopy (egd) with propofol N/A 03/21/2015    Procedure: ESOPHAGOGASTRODUODENOSCOPY (EGD) WITH PROPOFOL;  Surgeon: Gatha Mayer, MD;  Location: [facility];  Service: Endoscopy;  Laterality: N/A;   Family History  Problem Relation Age of Onset  . Heart disease Mother 61  . Hypertension Mother   . Stroke Father   . Hypertension Father   . Colon cancer Neg Hx   . Other Sister     Esophageal strictures  . Anemia Sister    Social History  Substance Use Topics  . Smoking status: Former Smoker -- 0.50 packs/day for 40 years    Types: Cigarettes    Quit date: 05/14/2012  . Smokeless tobacco: Never Used  . Alcohol Use: No   OB History    No data available     Review of Systems  All other systems negative except as documented in the HPI. All pertinent positives and negatives as reviewed in the HPI.  Allergies  Dicyclomine; Methocarbamol; Penicillins; and Shrimp  Home Medications   Prior to Admission medications   Medication Sig Start Date End Date Taking? Authorizing Provider  cetirizine (ZYRTEC) 10 MG tablet Take 10 mg by mouth daily as needed for allergies.   Yes Historical Provider, MD  clonazePAM (KLONOPIN) 1 MG tablet Take 0.5-1 mg by mouth 2 (two) times daily as needed for anxiety.   Yes Historical Provider, MD  estradiol (ESTRACE) 0.5 MG tablet Take 0.5 mg by mouth daily.   Yes Historical Provider, MD  phentermine 37.5 MG capsule Take 37.5 mg by mouth every morning.   Yes Historical Provider, MD  sertraline (ZOLOFT) 100 MG tablet Take 50  mg by mouth every morning.    Yes Historical Provider, MD  pyridostigmine (MESTINON) 60 MG tablet Take 1 tablet (60 mg total) by mouth every 8 (eight) hours. Generic OK Patient not taking: Reported on 04/05/2015 03/27/15   Thurnell Lose, MD   BP 127/75 mmHg  Pulse 109  Temp(Src) 101.5 F (38.6 C) (Rectal)  Resp 24  Wt 127.461 kg  SpO2 96% Physical Exam  Constitutional: She is oriented to person, place, and time. She appears well-developed and well-nourished. No distress.  HENT:  Head: Normocephalic and atraumatic.  Mouth/Throat: Oropharynx is clear and moist.  Eyes: Pupils are equal, round, and reactive to light.  Neck: Normal range of motion. Neck supple.  Cardiovascular: Normal rate, regular rhythm and normal heart sounds.  Exam reveals no gallop and no friction rub.   No murmur heard. Pulmonary/Chest: Effort normal and breath sounds normal. No respiratory distress. She has no wheezes.  Abdominal: Soft. Bowel sounds are normal. She exhibits no distension. There is no tenderness.  Neurological: She is alert and oriented to person, place, and time. She exhibits normal muscle tone. Coordination normal.  Skin: Skin is warm and dry. No rash noted. No erythema.  Psychiatric: She has a normal mood and affect. Her behavior is normal.  Nursing note and vitals reviewed.   ED Course  Procedures (including critical care time) Labs Review Labs Reviewed  URINALYSIS, ROUTINE W REFLEX MICROSCOPIC (NOT AT [facility]) - Abnormal; Notable for the following:    Color, Urine AMBER (*)    APPearance CLOUDY (*)    Ketones, ur 15 (*)    Protein, ur 30 (*)    Nitrite POSITIVE (*)    Leukocytes, UA MODERATE (*)    All other components within normal limits  CBC WITH DIFFERENTIAL/PLATELET - Abnormal; Notable for the following:    WBC 3.8 (*)    Hemoglobin 9.6 (*)    HCT 30.0 (*)    MCV 75.8 (*)    MCH 24.2 (*)    RDW 20.4 (*)    All other components within normal limits  COMPREHENSIVE METABOLIC PANEL  - Abnormal; Notable for the following:    Calcium 8.5 (*)    Albumin 3.2 (*)    AST 59 (*)    All other components within normal limits  URINE MICROSCOPIC-ADD ON - Abnormal; Notable for the following:    Squamous Epithelial / LPF 6-30 (*)    Bacteria, UA MANY (*)    All other components within normal limits  URINE CULTURE  I-STAT CG4 LACTIC ACID, ED    Imaging Review Dg Chest 2 View  04/05/2015  CLINICAL DATA:  55 year old female with dizziness for several days, increasing. Cough and weakness. Initial encounter. EXAM: CHEST  2 VIEW COMPARISON:  Chest CT 03/22/2015 and earlier. FINDINGS: Large body habitus.  Low lung volumes. Bibasilar atelectasis. Stable cardiac size and mediastinal contours. Visualized tracheal air column is within normal limits. No pneumothorax or pulmonary edema. No pleural effusion. No acute osseous abnormality identified. IMPRESSION: Very low lung volumes with bibasilar atelectasis. Electronically Signed   By: Genevie Ann M.D.   On: 04/05/2015 11:35   I have personally reviewed and evaluated these images and lab results as part of my medical decision-making.  Patient will be treated for UTI/pyelonephritis, although the patient has not had any vomiting.  The patient will be told to return here as needed.  She was given IV fluids here in the emergency department and ambulated without difficulty.  Patient is feeling some better.  I feel most likely her symptoms are related to fever and her infection.  She is not septic at this point    Dalia Heading, PA-C 04/05/15 Gerlach Liu, MD 04/05/15 [phone]

## 2015-04-05 NOTE — Discharge Instructions (Signed)
Return here as needed. Follow up with your doctor. Increase your fluid intake. °

## 2015-04-05 NOTE — ED Notes (Signed)
Pt reports she has been dizzy for the past couple of days, worse today. Dysuria noted. States she was recently dx with MG.

## 2015-04-05 NOTE — ED Notes (Signed)
Pt noted to be tachycardiac after walking to the bathroom at a rate of 130. Provider informed.

## 2015-04-07 ENCOUNTER — Inpatient Hospital Stay (HOSPITAL_COMMUNITY)
Admission: EM | Admit: 2015-04-07 | Discharge: 2015-04-13 | DRG: 057 | Disposition: A | Discharge status: Home / Self Care | Payer: BC Managed Care – PPO | Attending: Internal Medicine | Admitting: Internal Medicine

## 2015-04-07 ENCOUNTER — Emergency Department (HOSPITAL_COMMUNITY): Payer: BC Managed Care – PPO

## 2015-04-07 ENCOUNTER — Encounter (HOSPITAL_COMMUNITY): Payer: Self-pay | Admitting: Emergency Medicine

## 2015-04-07 DIAGNOSIS — R0789 Other chest pain: Secondary | ICD-10-CM | POA: Diagnosis not present

## 2015-04-07 DIAGNOSIS — R0600 Dyspnea, unspecified: Secondary | ICD-10-CM | POA: Diagnosis present

## 2015-04-07 DIAGNOSIS — Z6841 Body Mass Index (BMI) 40.0 and over, adult: Secondary | ICD-10-CM

## 2015-04-07 DIAGNOSIS — R1310 Dysphagia, unspecified: Secondary | ICD-10-CM | POA: Diagnosis not present

## 2015-04-07 DIAGNOSIS — E876 Hypokalemia: Secondary | ICD-10-CM | POA: Diagnosis present

## 2015-04-07 DIAGNOSIS — Z7952 Long term (current) use of systemic steroids: Secondary | ICD-10-CM

## 2015-04-07 DIAGNOSIS — B9620 Unspecified Escherichia coli [E. coli] as the cause of diseases classified elsewhere: Secondary | ICD-10-CM | POA: Diagnosis present

## 2015-04-07 DIAGNOSIS — G7000 Myasthenia gravis without (acute) exacerbation: Secondary | ICD-10-CM | POA: Diagnosis present

## 2015-04-07 DIAGNOSIS — R591 Generalized enlarged lymph nodes: Secondary | ICD-10-CM | POA: Diagnosis present

## 2015-04-07 DIAGNOSIS — D649 Anemia, unspecified: Secondary | ICD-10-CM | POA: Diagnosis present

## 2015-04-07 DIAGNOSIS — D709 Neutropenia, unspecified: Secondary | ICD-10-CM | POA: Diagnosis present

## 2015-04-07 DIAGNOSIS — H02402 Unspecified ptosis of left eyelid: Secondary | ICD-10-CM | POA: Diagnosis present

## 2015-04-07 DIAGNOSIS — R0602 Shortness of breath: Secondary | ICD-10-CM

## 2015-04-07 DIAGNOSIS — G7001 Myasthenia gravis with (acute) exacerbation: Principal | ICD-10-CM | POA: Diagnosis present

## 2015-04-07 DIAGNOSIS — D638 Anemia in other chronic diseases classified elsewhere: Secondary | ICD-10-CM | POA: Diagnosis present

## 2015-04-07 DIAGNOSIS — T368X5A Adverse effect of other systemic antibiotics, initial encounter: Secondary | ICD-10-CM | POA: Diagnosis present

## 2015-04-07 DIAGNOSIS — I10 Essential (primary) hypertension: Secondary | ICD-10-CM | POA: Diagnosis present

## 2015-04-07 DIAGNOSIS — K219 Gastro-esophageal reflux disease without esophagitis: Secondary | ICD-10-CM | POA: Diagnosis present

## 2015-04-07 DIAGNOSIS — N390 Urinary tract infection, site not specified: Secondary | ICD-10-CM | POA: Diagnosis not present

## 2015-04-07 DIAGNOSIS — R161 Splenomegaly, not elsewhere classified: Secondary | ICD-10-CM | POA: Diagnosis present

## 2015-04-07 DIAGNOSIS — F329 Major depressive disorder, single episode, unspecified: Secondary | ICD-10-CM | POA: Diagnosis present

## 2015-04-07 DIAGNOSIS — Z87891 Personal history of nicotine dependence: Secondary | ICD-10-CM

## 2015-04-07 HISTORY — DX: Myasthenia gravis without (acute) exacerbation: G70.00

## 2015-04-07 HISTORY — DX: Unspecified Escherichia coli (E. coli) as the cause of diseases classified elsewhere: N39.0

## 2015-04-07 HISTORY — DX: Unspecified Escherichia coli (E. coli) as the cause of diseases classified elsewhere: B96.20

## 2015-04-07 LAB — I-STAT TROPONIN, ED
Troponin i, poc: 0 ng/mL (ref 0.00–0.08)
Troponin i, poc: 0 ng/mL (ref 0.00–0.08)

## 2015-04-07 LAB — CBC
HCT: 27.5 % — ABNORMAL LOW (ref 36.0–46.0)
Hemoglobin: 8.8 g/dL — ABNORMAL LOW (ref 12.0–15.0)
MCH: 24.4 pg — ABNORMAL LOW (ref 26.0–34.0)
MCHC: 32 g/dL (ref 30.0–36.0)
MCV: 76.2 fL — ABNORMAL LOW (ref 78.0–100.0)
Platelets: 213 10*3/uL (ref 150–400)
RBC: 3.61 MIL/uL — ABNORMAL LOW (ref 3.87–5.11)
RDW: 21 % — ABNORMAL HIGH (ref 11.5–15.5)
WBC: 1.9 10*3/uL — ABNORMAL LOW (ref 4.0–10.5)

## 2015-04-07 LAB — BASIC METABOLIC PANEL
Anion gap: 10 (ref 5–15)
BUN: 9 mg/dL (ref 6–20)
CO2: 27 mmol/L (ref 22–32)
Calcium: 8.7 mg/dL — ABNORMAL LOW (ref 8.9–10.3)
Chloride: 100 mmol/L — ABNORMAL LOW (ref 101–111)
Creatinine, Ser: 0.77 mg/dL (ref 0.44–1.00)
GFR calc Af Amer: >60 mL/min (ref >60)
GFR calc non Af Amer: >60 mL/min (ref >60)
Glucose, Bld: 97 mg/dL (ref 65–99)
Potassium: 3.3 mmol/L — ABNORMAL LOW (ref 3.5–5.1)
Sodium: 137 mmol/L (ref 135–145)

## 2015-04-07 LAB — URINALYSIS, ROUTINE W REFLEX MICROSCOPIC
Bilirubin Urine: NEGATIVE
Glucose, UA: NEGATIVE mg/dL
Hgb urine dipstick: NEGATIVE
Ketones, ur: NEGATIVE mg/dL
Leukocytes, UA: NEGATIVE
Nitrite: NEGATIVE
Protein, ur: NEGATIVE mg/dL
Specific Gravity, Urine: 1.008 (ref 1.005–1.030)
pH: 6 (ref 5.0–8.0)

## 2015-04-07 LAB — CBC WITH DIFFERENTIAL/PLATELET
Basophils Absolute: 0 10*3/uL (ref 0.0–0.1)
Basophils Relative: 0 %
Eosinophils Absolute: 0 10*3/uL (ref 0.0–0.7)
Eosinophils Relative: 0 %
HCT: 29.5 % — ABNORMAL LOW (ref 36.0–46.0)
Hemoglobin: 9.5 g/dL — ABNORMAL LOW (ref 12.0–15.0)
Lymphocytes Relative: 23 %
Lymphs Abs: 0.6 10*3/uL — ABNORMAL LOW (ref 0.7–4.0)
MCH: 24.4 pg — ABNORMAL LOW (ref 26.0–34.0)
MCHC: 32.2 g/dL (ref 30.0–36.0)
MCV: 75.8 fL — ABNORMAL LOW (ref 78.0–100.0)
Monocytes Absolute: 0.1 10*3/uL (ref 0.1–1.0)
Monocytes Relative: 4 %
Neutro Abs: 2 10*3/uL (ref 1.7–7.7)
Neutrophils Relative %: 73 %
Platelets: 218 10*3/uL (ref 150–400)
RBC: 3.89 MIL/uL (ref 3.87–5.11)
RDW: 21 % — ABNORMAL HIGH (ref 11.5–15.5)
WBC: 2.7 10*3/uL — ABNORMAL LOW (ref 4.0–10.5)

## 2015-04-07 LAB — D-DIMER, QUANTITATIVE: D-Dimer, Quant: 1.57 ug/mL-FEU — ABNORMAL HIGH (ref 0.00–0.50)

## 2015-04-07 LAB — BLOOD GAS, ARTERIAL
Acid-base deficit: 0.2 mmol/L (ref 0.0–2.0)
Bicarbonate: 24 mEq/L (ref 20.0–24.0)
Drawn by: 283401
O2 Content: 2 L/min
O2 Saturation: 93.8 %
Patient temperature: 98.6
TCO2: 25.2 mmol/L (ref 0–100)
pCO2 arterial: 39.8 mmHg (ref 35.0–45.0)
pH, Arterial: 7.397 (ref 7.350–7.450)
pO2, Arterial: 68 mmHg — ABNORMAL LOW (ref 80.0–100.0)

## 2015-04-07 LAB — CREATININE, SERUM
Creatinine, Ser: 0.69 mg/dL (ref 0.44–1.00)
GFR calc Af Amer: >60 mL/min (ref >60)
GFR calc non Af Amer: >60 mL/min (ref >60)

## 2015-04-07 LAB — BRAIN NATRIURETIC PEPTIDE: B Natriuretic Peptide: 8.6 pg/mL (ref 0.0–100.0)

## 2015-04-07 LAB — MRSA PCR SCREENING: MRSA by PCR: NEGATIVE

## 2015-04-07 LAB — POC OCCULT BLOOD, ED: Fecal Occult Bld: NEGATIVE

## 2015-04-07 LAB — MONONUCLEOSIS SCREEN: Mono Screen: NEGATIVE

## 2015-04-07 MED ORDER — POTASSIUM CHLORIDE 10 MEQ/100ML IV SOLN
10.0000 meq | INTRAVENOUS | Status: AC
  Administered 2015-04-07 (×2): 10 meq via INTRAVENOUS
  Filled 2015-04-07 (×2): qty 100

## 2015-04-07 MED ORDER — SENNOSIDES-DOCUSATE SODIUM 8.6-50 MG PO TABS
2.0000 | ORAL_TABLET | Freq: Every day | ORAL | Status: DC
  Administered 2015-04-07 – 2015-04-10 (×3): 2 via ORAL
  Filled 2015-04-07 (×6): qty 2

## 2015-04-07 MED ORDER — ENSURE ENLIVE PO LIQD
237.0000 mL | Freq: Two times a day (BID) | ORAL | Status: DC
  Administered 2015-04-08 – 2015-04-13 (×8): 237 mL via ORAL
  Filled 2015-04-07 (×9): qty 237

## 2015-04-07 MED ORDER — ALBUTEROL SULFATE (2.5 MG/3ML) 0.083% IN NEBU
2.5000 mg | INHALATION_SOLUTION | Freq: Once | RESPIRATORY_TRACT | Status: AC
  Administered 2015-04-07: 2.5 mg via RESPIRATORY_TRACT
  Filled 2015-04-07: qty 3

## 2015-04-07 MED ORDER — BISACODYL 10 MG RE SUPP: 10.0000 mg | Freq: Every day | RECTAL | Status: DC | PRN

## 2015-04-07 MED ORDER — ESTRADIOL 1 MG PO TABS
0.5000 mg | ORAL_TABLET | Freq: Every day | ORAL | Status: DC
  Administered 2015-04-08 – 2015-04-13 (×6): 0.5 mg via ORAL
  Filled 2015-04-07 (×6): qty 0.5

## 2015-04-07 MED ORDER — CLONAZEPAM 0.5 MG PO TABS: 0.5000 mg | ORAL_TABLET | Freq: Two times a day (BID) | ORAL | Status: DC | PRN

## 2015-04-07 MED ORDER — ACETAMINOPHEN 325 MG PO TABS
650.0000 mg | ORAL_TABLET | Freq: Four times a day (QID) | ORAL | Status: DC | PRN
  Administered 2015-04-07 – 2015-04-12 (×6): 650 mg via ORAL
  Filled 2015-04-07 (×7): qty 2

## 2015-04-07 MED ORDER — ALBUTEROL (5 MG/ML) CONTINUOUS INHALATION SOLN
10.0000 mg/h | INHALATION_SOLUTION | Freq: Once | RESPIRATORY_TRACT | Status: AC
  Administered 2015-04-07: 10 mg/h via RESPIRATORY_TRACT
  Filled 2015-04-07: qty 20

## 2015-04-07 MED ORDER — FERROUS SULFATE 325 (65 FE) MG PO TABS
325.0000 mg | ORAL_TABLET | Freq: Once | ORAL | Status: AC
  Administered 2015-04-07: 325 mg via ORAL
  Filled 2015-04-07: qty 1

## 2015-04-07 MED ORDER — FERROUS SULFATE 325 (65 FE) MG PO TABS: 325.0000 mg | ORAL_TABLET | Freq: Two times a day (BID) | ORAL | Status: DC

## 2015-04-07 MED ORDER — IPRATROPIUM-ALBUTEROL 0.5-2.5 (3) MG/3ML IN SOLN
3.0000 mL | Freq: Once | RESPIRATORY_TRACT | Status: AC
  Administered 2015-04-07: 3 mL via RESPIRATORY_TRACT
  Filled 2015-04-07: qty 3

## 2015-04-07 MED ORDER — PREDNISONE 20 MG PO TABS: 40.0000 mg | ORAL_TABLET | Freq: Every day | ORAL | Status: DC

## 2015-04-07 MED ORDER — ALUM & MAG HYDROXIDE-SIMETH 200-200-20 MG/5ML PO SUSP: 30.0000 mL | Freq: Four times a day (QID) | ORAL | Status: DC | PRN

## 2015-04-07 MED ORDER — NITROFURANTOIN MONOHYD MACRO 100 MG PO CAPS: 100.0000 mg | ORAL_CAPSULE | Freq: Two times a day (BID) | ORAL | Status: DC

## 2015-04-07 MED ORDER — FLEET ENEMA 7-19 GM/118ML RE ENEM
1.0000 | ENEMA | Freq: Once | RECTAL | Status: DC | PRN
  Filled 2015-04-07: qty 1

## 2015-04-07 MED ORDER — IOHEXOL 350 MG/ML SOLN
100.0000 mL | Freq: Once | INTRAVENOUS | Status: AC | PRN
  Administered 2015-04-07: 80 mL via INTRAVENOUS

## 2015-04-07 MED ORDER — BENZONATATE 100 MG PO CAPS
100.0000 mg | ORAL_CAPSULE | Freq: Three times a day (TID) | ORAL | Status: DC
Start: 2015-04-07 — End: 2015-05-01

## 2015-04-07 MED ORDER — ACETAMINOPHEN 650 MG RE SUPP: 650.0000 mg | Freq: Four times a day (QID) | RECTAL | Status: DC | PRN

## 2015-04-07 MED ORDER — SODIUM CHLORIDE 0.9% FLUSH
3.0000 mL | Freq: Two times a day (BID) | INTRAVENOUS | Status: DC
  Administered 2015-04-07 – 2015-04-13 (×10): 3 mL via INTRAVENOUS

## 2015-04-07 MED ORDER — SODIUM CHLORIDE 0.9 % IV BOLUS (SEPSIS)
1000.0000 mL | Freq: Once | INTRAVENOUS | Status: AC
  Administered 2015-04-07: 1000 mL via INTRAVENOUS

## 2015-04-07 MED ORDER — ALBUTEROL SULFATE HFA 108 (90 BASE) MCG/ACT IN AERS: 2.0000 | INHALATION_SPRAY | RESPIRATORY_TRACT | Status: DC | PRN

## 2015-04-07 MED ORDER — METHYLPREDNISOLONE SODIUM SUCC 125 MG IJ SOLR
125.0000 mg | Freq: Once | INTRAMUSCULAR | Status: AC
  Administered 2015-04-07: 125 mg via INTRAVENOUS
  Filled 2015-04-07: qty 2

## 2015-04-07 MED ORDER — SERTRALINE HCL 50 MG PO TABS
50.0000 mg | ORAL_TABLET | Freq: Every morning | ORAL | Status: DC
  Administered 2015-04-08 – 2015-04-13 (×6): 50 mg via ORAL
  Filled 2015-04-07 (×6): qty 1

## 2015-04-07 MED ORDER — SODIUM CHLORIDE 0.9% FLUSH: 3.0000 mL | INTRAVENOUS | Status: DC | PRN

## 2015-04-07 MED ORDER — SODIUM CHLORIDE 0.9 % IV SOLN
250.0000 mL | INTRAVENOUS | Status: DC | PRN
  Administered 2015-04-07: 250 mL via INTRAVENOUS

## 2015-04-07 MED ORDER — SODIUM CHLORIDE 0.9% FLUSH
3.0000 mL | Freq: Two times a day (BID) | INTRAVENOUS | Status: DC
  Administered 2015-04-08 – 2015-04-09 (×3): 3 mL via INTRAVENOUS
  Administered 2015-04-10 – 2015-04-11 (×2): via INTRAVENOUS
  Administered 2015-04-12 – 2015-04-13 (×3): 3 mL via INTRAVENOUS

## 2015-04-07 MED ORDER — GUAIFENESIN ER 600 MG PO TB12: 600.0000 mg | ORAL_TABLET | Freq: Two times a day (BID) | ORAL | Status: DC

## 2015-04-07 MED ORDER — PYRIDOSTIGMINE BROMIDE 60 MG PO TABS
60.0000 mg | ORAL_TABLET | Freq: Three times a day (TID) | ORAL | Status: DC
  Administered 2015-04-07 – 2015-04-08 (×3): 60 mg via ORAL
  Filled 2015-04-07 (×3): qty 1

## 2015-04-07 MED ORDER — ONDANSETRON 4 MG PO TBDP: 4.0000 mg | ORAL_TABLET | Freq: Three times a day (TID) | ORAL | Status: DC | PRN

## 2015-04-07 MED ORDER — ONDANSETRON HCL 4 MG/2ML IJ SOLN: 4.0000 mg | Freq: Four times a day (QID) | INTRAMUSCULAR | Status: DC | PRN

## 2015-04-07 MED ORDER — ALBUTEROL SULFATE (2.5 MG/3ML) 0.083% IN NEBU: 2.5000 mg | INHALATION_SOLUTION | RESPIRATORY_TRACT | Status: DC | PRN

## 2015-04-07 MED ORDER — ENOXAPARIN SODIUM 40 MG/0.4ML ~~LOC~~ SOLN
40.0000 mg | SUBCUTANEOUS | Status: DC
  Administered 2015-04-07 – 2015-04-12 (×6): 40 mg via SUBCUTANEOUS
  Filled 2015-04-07 (×6): qty 0.4

## 2015-04-07 MED ORDER — ONDANSETRON HCL 4 MG PO TABS: 4.0000 mg | ORAL_TABLET | Freq: Four times a day (QID) | ORAL | Status: DC | PRN

## 2015-04-07 MED ORDER — CEFUROXIME AXETIL 500 MG PO TABS
500.0000 mg | ORAL_TABLET | Freq: Two times a day (BID) | ORAL | Status: AC
  Administered 2015-04-08 – 2015-04-11 (×6): 500 mg via ORAL
  Filled 2015-04-07 (×8): qty 1

## 2015-04-07 MED ORDER — IBUPROFEN 200 MG PO TABS
400.0000 mg | ORAL_TABLET | Freq: Four times a day (QID) | ORAL | Status: DC | PRN
  Administered 2015-04-09: 400 mg via ORAL
  Filled 2015-04-07 (×2): qty 2

## 2015-04-07 NOTE — ED Notes (Signed)
Attempted report 

## 2015-04-07 NOTE — Procedures (Signed)
Good effort on the NIF which was -60, poor effort on the VC patient unable to perform.

## 2015-04-07 NOTE — ED Notes (Signed)
Pt reports seen here Thursday and given antibiotics. Pt thinks she is having an allergic reaction to them. Pt reports symptoms of shortness of breath, palpitations, and fever onset Thursday after taking the new medication. Pt took ibuprofen PTA.

## 2015-04-07 NOTE — ED Notes (Signed)
RT notified of need for breathing tx. 

## 2015-04-07 NOTE — ED Provider Notes (Signed)
CSN: [id number]     Arrival date & time 04/07/15  1051 History   First MD Initiated Contact with Patient 04/07/15 1121     Chief Complaint  Patient presents with  . Shortness of Breath  . Palpitations  . Fever     (Consider location/radiation/quality/duration/timing/severity/associated sxs/prior Treatment) HPI   Frances Medina is a 55 y.o. female, with a history of depression, hypertension, and GERD, presenting to the ED with shortness of breath, productive cough with white sputum, fever (Tmax 100.8), all for the last two days. Pt states all of these symptoms began after she began taking the Cipro she was given on 2/2 for a UTI. Pt has taken ibuprofen, which has improved the fever. Pt endorses an episode of chest pain that came on this morning, but resolved spontaneously. Pt states, "It felt like acid reflux." Patient denies nausea/vomiting, dizziness, hemoptysis, urinary complaints, abdominal pain, or any other complaints.    Past Medical History  Diagnosis Date  . GERD (gastroesophageal reflux disease)   . 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 Knee injury   . Depression   . Seasonal allergies     takes Zytrec  . Fibroid uterus     size of a dime  . Umbilical hernia     watching , no plans for surgery at present  . H/O hiatal hernia   . Hypertension     "went away when I stopped smoking"  . Anxiety   . Migraine     "maybe couple times/month" (03/20/2015)  . Arthritis     "knees" (03/20/2015)  . Osteoarthritis of right knee 08/30/2013  . Osteoarthritis of left knee 12/02/2013  . Bell's palsy     affecting left eye vision   Past Surgical History  Procedure Laterality Date  . Cesarean section  1987; 1989  . Dilation and curettage of uterus    . Tubal ligation  1989  . Vaginal hysterectomy  1990's?    "apparently took out one of my ovaries at the time too cause one's missing"  . Partial knee arthroplasty Right 08/30/2013    Procedure: RIGHT UNICOMPARTMENTAL KNEE;  Surgeon: Johnny Bridge, MD;  Location: [facility];  Service: Orthopedics;  Laterality: Right;  . Colonoscopy with propofol N/A 09/19/2013    Procedure: COLONOSCOPY WITH PROPOFOL;  Surgeon: Ladene Artist, MD;  Location: [facility];  Service: Endoscopy;  Laterality: N/A;  . Partial knee arthroplasty Left 12/02/2013    Procedure: LEFT KNEE UNI ARTHROPLASTY;  Surgeon: Johnny Bridge, MD;  Location: [place];  Service: Orthopedics;  Laterality: Left;  . Esophagogastroduodenoscopy (egd) with propofol N/A 03/21/2015    Procedure: ESOPHAGOGASTRODUODENOSCOPY (EGD) WITH PROPOFOL;  Surgeon: Gatha Mayer, MD;  Location: [place];  Service: Endoscopy;  Laterality: N/A;   Family History  Problem Relation Age of Onset  . Heart disease Mother 68  . Hypertension Mother   . Stroke Father   . Hypertension Father   . Colon cancer Neg Hx   . Other Sister     Esophageal strictures  . Anemia Sister    Social History  Substance Use Topics  . Smoking status: Former Smoker -- 0.50 packs/day for 40 years    Types: Cigarettes    Quit date: 05/14/2012  . Smokeless tobacco: Never Used  . Alcohol Use: No   OB History    No data available     Review of Systems  Constitutional: Positive for fever. Negative for  diaphoresis.  Respiratory: Positive for cough and shortness of breath.   Gastrointestinal: Negative for nausea, vomiting, abdominal pain, diarrhea and constipation.  Genitourinary: Negative for dysuria and hematuria.  Skin: Negative for color change and pallor.  All other systems reviewed and are negative.     Allergies  Dicyclomine; Methocarbamol; Penicillins; Ciprofloxacin; and Shrimp  Home Medications   Prior to Admission medications   Medication Sig Start Date End Date Taking? Authorizing Provider  cetirizine (ZYRTEC) 10 MG tablet Take 10 mg by mouth daily as needed for allergies.   Yes Historical Provider, MD  clonazePAM (KLONOPIN) 1 MG tablet Take 0.5-1 mg by mouth 2 (two) times  daily as needed for anxiety.   Yes Historical Provider, MD  estradiol (ESTRACE) 0.5 MG tablet Take 0.5 mg by mouth daily.   Yes Historical Provider, MD  ibuprofen (ADVIL,MOTRIN) 400 MG tablet Take 400 mg by mouth every 6 (six) hours as needed for mild pain.   Yes Historical Provider, MD  phentermine 37.5 MG capsule Take 37.5 mg by mouth every morning.   Yes Historical Provider, MD  pyridostigmine (MESTINON) 60 MG tablet Take 1 tablet (60 mg total) by mouth every 8 (eight) hours. Generic OK 03/27/15  Yes Thurnell Lose, MD  sertraline (ZOLOFT) 100 MG tablet Take 50 mg by mouth every morning.    Yes Historical Provider, MD  albuterol (PROVENTIL HFA;VENTOLIN HFA) 108 (90 Base) MCG/ACT inhaler Inhale 2 puffs into the lungs every 4 (four) hours as needed for wheezing or shortness of breath. 04/07/15   Shawn C Joy, PA-C  benzonatate (TESSALON) 100 MG capsule Take 1 capsule (100 mg total) by mouth every 8 (eight) hours. 04/07/15   Shawn C Joy, PA-C  ciprofloxacin (CIPRO) 500 MG tablet Take 1 tablet (500 mg total) by mouth 2 (two) times daily. 04/05/15   Dalia Heading, PA-C  ferrous sulfate 325 (65 FE) MG tablet Take 1 tablet (325 mg total) by mouth 2 (two) times daily with a meal. 04/07/15   Shawn C Joy, PA-C  guaiFENesin (MUCINEX) 600 MG 12 hr tablet Take 1 tablet (600 mg total) by mouth 2 (two) times daily. 04/07/15   Shawn C Joy, PA-C  nitrofurantoin, macrocrystal-monohydrate, (MACROBID) 100 MG capsule Take 1 capsule (100 mg total) by mouth 2 (two) times daily. 04/07/15   Shawn C Joy, PA-C  ondansetron (ZOFRAN ODT) 4 MG disintegrating tablet Take 1 tablet (4 mg total) by mouth every 8 (eight) hours as needed for nausea or vomiting. 04/07/15   Shawn C Joy, PA-C  predniSONE (DELTASONE) 20 MG tablet Take 2 tablets (40 mg total) by mouth daily with breakfast. 04/07/15   Shawn C Joy, PA-C   BP 121/77 mmHg  Pulse 101  Temp(Src) 98.7 F (37.1 C) (Oral)  Resp 27  SpO2 91% Physical Exam  Constitutional: She appears  well-developed and well-nourished. No distress.  HENT:  Head: Normocephalic and atraumatic.  Eyes: Conjunctivae are normal. Pupils are equal, round, and reactive to light.  Neck: Normal range of motion. Neck supple.  Cardiovascular: Normal rate, regular rhythm, normal heart sounds and intact distal pulses.   Pulmonary/Chest: Effort normal and breath sounds normal. No respiratory distress. She exhibits no tenderness.  Abdominal: Soft. Bowel sounds are normal. There is no tenderness. There is no guarding.  Genitourinary:  Rectal exam performed. No gross blood. Some soft stool in the rectal vault. RN, Joelene Millin, served as chaperone during the exam. Hemoccult negative.  Musculoskeletal: She exhibits no edema or tenderness.  Lymphadenopathy:  She has no cervical adenopathy.  Neurological: She is alert.  Skin: Skin is warm and dry. She is not diaphoretic.  Nursing note and vitals reviewed.   ED Course  Procedures (including critical care time) Labs Review Labs Reviewed  BASIC METABOLIC PANEL - Abnormal; Notable for the following:    Potassium 3.3 (*)    Chloride 100 (*)    Calcium 8.7 (*)    All other components within normal limits  CBC WITH DIFFERENTIAL/PLATELET - Abnormal; Notable for the following:    WBC 2.7 (*)    Hemoglobin 9.5 (*)    HCT 29.5 (*)    MCV 75.8 (*)    MCH 24.4 (*)    RDW 21.0 (*)    Lymphs Abs 0.6 (*)    All other components within normal limits  D-DIMER, QUANTITATIVE (NOT AT [facility]) - Abnormal; Notable for the following:    D-Dimer, Quant 1.57 (*)    All other components within normal limits  URINE CULTURE  BRAIN NATRIURETIC PEPTIDE  URINALYSIS, ROUTINE W REFLEX MICROSCOPIC (NOT AT [facility])  MONONUCLEOSIS SCREEN  I-STAT TROPOININ, ED  POC OCCULT BLOOD, ED  I-STAT TROPOININ, ED    Imaging Review Dg Chest 2 View  04/07/2015  CLINICAL DATA:  Pt c/o left-sided chest pain, SOB, cough, and fever x 3 days. Hx of HTN and GERD. Pt is a former smoker. EXAM: CHEST -  2 VIEW COMPARISON:  04/05/2015 FINDINGS: Low lung volumes. Linear subsegmental atelectasis in the lung bases as before. No overt edema. Heart size normal. No pneumothorax. No effusion. Spurring in the mid thoracic spine. IMPRESSION: Low volumes with bibasilar atelectasis.  No acute disease. Electronically Signed   By: Lucrezia Europe M.D.   On: 04/07/2015 12:01   Ct Angio Chest Pe W/cm &/or Wo Cm  04/07/2015  CLINICAL DATA:  SHOB AND SOME LEFT SIDED CHEST PAIN SINCE THIS AM 0500 RASH TO BACK AND ARM WITH RECENT ANTIBIOTIC STARTED Thursday PMH: HTN, GERD (gastroesophageal reflux disease); Tumors; Knee injury; Depression EXAM: CT ANGIOGRAPHY CHEST WITH CONTRAST TECHNIQUE: Multidetector CT imaging of the chest was performed using the standard protocol during bolus administration of intravenous contrast. Multiplanar CT image reconstructions and MIPs were obtained to evaluate the vascular anatomy. CONTRAST:  66mL OMNIPAQUE IOHEXOL 350 MG/ML SOLN COMPARISON:  03/22/2015 FINDINGS: Right arm IV contrast injection. The SVC is patent. Right atrium nondilated. RV is nondilated. Satisfactory opacification of pulmonary arteries noted, and there is no evidence of pulmonary emboli. Patent pulmonary veins. Adequate contrast opacification of the thoracic aorta with no evidence of dissection, aneurysm, or stenosis. There is classic 3-vessel brachiocephalic arch anatomy without proximal stenosis. No pleural or pericardial effusion. Prominent axillary, left cervical level 4 and supraclavicular lymph nodes, new since prior study. Subsegmental atelectasis in basilar segments of both lower lobes. Flowing osteophytes across several contiguous levels in the lower thoracic spine. Sternum intact. Borderline splenomegaly suspected, new. Remainder visualized portions of upper abdomen unremarkable. Review of the MIP images confirms the above findings. IMPRESSION: 1. Negative for acute PE or thoracic aortic dissection. 2. Progressive splenomegaly as  well as prominence of bilateral axillary, left subclavian and cervical lymph nodes, possibly reactive but nonspecific. Follow-up suggested to exclude neoplasm or lymphoproliferative process. Electronically Signed   By: Lucrezia Europe M.D.   On: 04/07/2015 14:39   I have personally reviewed and evaluated these images and lab results as part of my medical decision-making.   EKG Interpretation   Date/Time:  Saturday April 07 2015 11:05:31 EST  Ventricular Rate:  103 PR Interval:  162 QRS Duration: 64 QT Interval:  348 QTC Calculation: 455 R Axis:   31 Text Interpretation:  Sinus tachycardia with Premature supraventricular  complexes Cannot rule out Anterior infarct , age undetermined Abnormal ECG  No significant change since last tracing Confirmed by [facility] MD, Corene Cornea  [phone]) on 04/07/2015 11:09:23 AM      MDM   Final diagnoses:  Shortness of breath    [name] presents with shortness of breath, productive cough, and fever for the past 2 days.  Findings and plan of care discussed with Merrily Pew, MD and then with Charlesetta Shanks, MD upon EDP shift change.   Patient is mildly tachycardic and mildly tachypneic. Currently afebrile. Maintains SPO2 above 94% on room air. HEART score is 3, indicating low risk for a cardiac event. Wells criteria score is 1.5, indicating low risk for PE. Anemia and leukopenia noted. Patient should follow-up with her PCP on this matter. Patient's d-dimer noted to be elevated at 1.57. Chest CTA was free from PE, but did find progressive splenomegaly as well as prominence of axillary, subclavian, and cervical lymph nodes. These findings combined with the anemia and leukopenia indicate that the patient would benefit from a hematology consult. 3:15 PM Spoke with Dr. Marin Olp, from Hem/Onc, who advised that he does not think that this fits the criteria for a malignancy or an oncology emergency, but advises that the patient will need to be seen within the next week  or so to have repeat labs drawn. Recommended testing for mono. Advised that the patient will need to call the cancer center on Monday to set up an appointment to meet with one of the physicians and to have repeat labs drawn. Patient was reevaluated multiple times while here in the ED. Patient showed improvement with 2 breathing treatments and Solu-Medrol.  4:25 PM RN reports that the patient's oxygen saturation dropped to 88%. Patient is visibly short of breath with mild exertion. Pulse increases to 120 with a walk to the bathroom. 2 L of supplemental O2 brings the patient's SPO2 to 100%. Due to the patient's new oxygen requirement along with her tachycardia and relatively negative results on her workup, the patient will be admitted for further evaluation and observation. In summary, patient has a CTA free from PE, no signs of infiltrate on chest x-ray, and no identified source for her symptoms at this time. 5:00 PM Spoke with Dr. Conley Canal, hospitalist, who stated that she would come see the patient and make a determination. 5:40 PM Dr. Conley Canal evaluated the patient and found that not only has the patient been recently diagnosed with myasthenia gravis, she was given a fluoroquinolone, Cipro, to treat her UTI, which is apparently contraindicated in patients with MG and can send them into a myasthenia crisis. The patient's myasthenia gravis diagnosis was not contained in her past medical history. Patient will be admitted to stepdown with neuro consult. Patient will be closely monitored to assure she does not develop a need for intubation. It should be noted that when the patient arrived and for most of her time here she was only mildly tachypneic, no increased work of breathing, spoke in full sentences, and voiced improvement with breathing treatments. Patient was presenting no where near the level that would require the stepdown unit. Her change in status occurred rather quickly and was centered around the time  her SpO2 saturation dropped.   Filed Vitals:   04/07/15 1330 04/07/15 1430 04/07/15 1515  04/07/15 1600  BP: 119/69 110/50 124/57 121/77  Pulse: 93 91 102 101  Temp:      TempSrc:      Resp: 22 21 20 27   SpO2: 94% 100% 100% 91%   Filed Vitals:   04/07/15 1330 04/07/15 1430 04/07/15 1515 04/07/15 1600  BP: 119/69 110/50 124/57 121/77  Pulse: 93 91 102 101  Temp:      TempSrc:      Resp: 22 21 20 27   SpO2: 94% 100% 100% 91%   Filed Vitals:   04/07/15 1600 04/07/15 1645 04/07/15 1900 04/07/15 1927  BP: 121/77  110/60   Pulse: 101 122 110 113  Temp:      TempSrc:      Resp: 27 22 20 26   SpO2: 91% 95% 95% 98%     Lorayne Bender, PA-C 04/07/15 2001  Merrily Pew, MD 04/08/15 [phone]

## 2015-04-07 NOTE — H&P (Addendum)
[facility] History and Physical  Frances Medina [id number] DOB: [birth year] DOA: 04/07/2015  Referring physician: Arlean Hopping, PA PCP: No primary care provider on file.   Chief Complaint: Shortness of breath  HPI: Frances Medina is a 55 y.o. female  Was recently diagnosed myasthenia gravis presents to the emergency room with a several-day history of worsening shortness of breath. She came to the emergency room a few days ago and was diagnosed with urinary tract infection, given a prescription for ciprofloxacin. Patient noticed a nonpruritic rash on her back and her difficulty breathing started after she received ciprofloxacin. She has had difficulty swallowing since being diagnosed with myasthenia gravis and takes mainly a liquid diet. In the emergency room, she had a CT angiogram of the chest which showed no PE. It did show lymphadenopathy and splenomegaly.  EDP Discussed with Dr. Marin Olp who recommended mono testing and outpatient follow-up with hematology. Patient received several nebulizer treatments and remained short of breath. Her oxygenation is ranging 88 - 96 percent on room air.   Review of Systems:  Complete systems review. As above otherwise negative.  Past Medical History  Diagnosis Date  . GERD (gastroesophageal reflux disease)   . 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 Knee injury   . Depression   . Seasonal allergies     takes Zytrec  . Fibroid uterus     size of a dime  . Umbilical hernia     watching , no plans for surgery at present  . H/O hiatal hernia   . Hypertension     "went away when I stopped smoking"  . Anxiety   . Migraine     "maybe couple times/month" (03/20/2015)  . Arthritis     "knees" (03/20/2015)  . Osteoarthritis of right knee 08/30/2013  . Osteoarthritis of left knee 12/02/2013  . Bell's palsy     affecting left eye vision   Past Surgical History  Procedure Laterality Date  . Cesarean section  1987; 1989  . Dilation and curettage of uterus     . Tubal ligation  1989  . Vaginal hysterectomy  1990's?    "apparently took out one of my ovaries at the time too cause one's missing"  . Partial knee arthroplasty Right 08/30/2013    Procedure: RIGHT UNICOMPARTMENTAL KNEE;  Surgeon: Johnny Bridge, MD;  Location: [place];  Service: Orthopedics;  Laterality: Right;  . Colonoscopy with propofol N/A 09/19/2013    Procedure: COLONOSCOPY WITH PROPOFOL;  Surgeon: Ladene Artist, MD;  Location: [facility];  Service: Endoscopy;  Laterality: N/A;  . Partial knee arthroplasty Left 12/02/2013    Procedure: LEFT KNEE UNI ARTHROPLASTY;  Surgeon: Johnny Bridge, MD;  Location: [place];  Service: Orthopedics;  Laterality: Left;  . Esophagogastroduodenoscopy (egd) with propofol N/A 03/21/2015    Procedure: ESOPHAGOGASTRODUODENOSCOPY (EGD) WITH PROPOFOL;  Surgeon: Gatha Mayer, MD;  Location: [place];  Service: Endoscopy;  Laterality: N/A;   Social History:  reports that she quit smoking about 2 years ago. Her smoking use included Cigarettes. She has a 20 pack-year smoking history. She has never used smokeless tobacco. She reports that she does not drink alcohol or use illicit drugs.  Allergies  Allergen Reactions  . Dicyclomine Hives  . Methocarbamol Hives  . Penicillins Hives    Has patient had a PCN reaction causing immediate rash, facial/tongue/throat swelling, SOB or lightheadedness with hypotension: Yes Has patient had a PCN reaction causing severe  rash involving mucus membranes or skin necrosis: No Has patient had a PCN reaction that required hospitalization No Has patient had a PCN reaction occurring within the last 10 years: No If all of the above answers are "NO", then may proceed with Cephalosporin use.   . Ciprofloxacin     Rash, shortness of breath  . Shrimp [Shellfish Allergy]     Family History  Problem Relation Age of Onset  . Heart disease Mother 49  . Hypertension Mother   . Stroke Father   .  Hypertension Father   . Colon cancer Neg Hx   . Other Sister     Esophageal strictures  . Anemia Sister     Prior to Admission medications   Medication Sig Start Date End Date Taking? Authorizing Provider  cetirizine (ZYRTEC) 10 MG tablet Take 10 mg by mouth daily as needed for allergies.   Yes Historical Provider, MD  clonazePAM (KLONOPIN) 1 MG tablet Take 0.5-1 mg by mouth 2 (two) times daily as needed for anxiety.   Yes Historical Provider, MD  estradiol (ESTRACE) 0.5 MG tablet Take 0.5 mg by mouth daily.   Yes Historical Provider, MD  ibuprofen (ADVIL,MOTRIN) 400 MG tablet Take 400 mg by mouth every 6 (six) hours as needed for mild pain.   Yes Historical Provider, MD  phentermine 37.5 MG capsule Take 37.5 mg by mouth every morning.   Yes Historical Provider, MD  pyridostigmine (MESTINON) 60 MG tablet Take 1 tablet (60 mg total) by mouth every 8 (eight) hours. Generic OK 03/27/15  Yes Thurnell Lose, MD  sertraline (ZOLOFT) 100 MG tablet Take 50 mg by mouth every morning.    Yes Historical Provider, MD  albuterol (PROVENTIL HFA;VENTOLIN HFA) 108 (90 Base) MCG/ACT inhaler Inhale 2 puffs into the lungs every 4 (four) hours as needed for wheezing or shortness of breath. 04/07/15   Shawn C Joy, PA-C  benzonatate (TESSALON) 100 MG capsule Take 1 capsule (100 mg total) by mouth every 8 (eight) hours. 04/07/15   Shawn C Joy, PA-C  ciprofloxacin (CIPRO) 500 MG tablet Take 1 tablet (500 mg total) by mouth 2 (two) times daily. 04/05/15   Dalia Heading, PA-C  ferrous sulfate 325 (65 FE) MG tablet Take 1 tablet (325 mg total) by mouth 2 (two) times daily with a meal. 04/07/15   Shawn C Joy, PA-C  guaiFENesin (MUCINEX) 600 MG 12 hr tablet Take 1 tablet (600 mg total) by mouth 2 (two) times daily. 04/07/15   Shawn C Joy, PA-C  nitrofurantoin, macrocrystal-monohydrate, (MACROBID) 100 MG capsule Take 1 capsule (100 mg total) by mouth 2 (two) times daily. 04/07/15   Shawn C Joy, PA-C  ondansetron (ZOFRAN ODT) 4 MG  disintegrating tablet Take 1 tablet (4 mg total) by mouth every 8 (eight) hours as needed for nausea or vomiting. 04/07/15   Shawn C Joy, PA-C  predniSONE (DELTASONE) 20 MG tablet Take 2 tablets (40 mg total) by mouth daily with breakfast. 04/07/15   Lorayne Bender, PA-C   Physical Exam: Filed Vitals:   04/07/15 1430 04/07/15 1515 04/07/15 1600 04/07/15 1645  BP: 110/50 124/57 121/77   Pulse: 91 102 101 122  Temp:      TempSrc:      Resp: 21 20 27 22   SpO2: 100% 100% 91% 95%    Wt Readings from Last 3 Encounters:  04/05/15 127.461 kg (281 lb)  03/19/15 127.461 kg (281 lb)  03/16/14 129.729 kg (286 lb)  BP 121/77 mmHg  Pulse 122  Temp(Src) 98.7 F (37.1 C) (Oral)  Resp 22  SpO2 95%  General Appearance:    obese female on nebulizer mask speaking and truncated sentences. Alert and oriented.   Head:    Normocephalic, without obvious abnormality, atraumatic  Eyes:    left ptosis noted. Pupils equal round reactive to light. Sclera nonicteric.   Nose:   Nares normal, septum midline, mucosa normal, no drainage    or sinus tenderness  Throat:   Lips, mucosa, and tongue normal; teeth and gums normal  Neck:   Supple, no lymphadenopathy or JVD   Back:     Symmetric, no curvature, ROM normal, no CVA tenderness  Lungs:     Clear to auscultation bilaterally, without wheezes rhonchi or rales. Tachypnea noted.   Chest Wall:    No tenderness or deformity   Heart:    Regular rate and rhythm, S1 and S2 normal, no murmur, rub   or gallop  Abdomen:     obese. Soft nontender. Normal bowel sounds   Genitalia:    deferred   Rectal:    deferred   Extremities:   Extremities normal, atraumatic, no cyanosis or edema  Pulses:   2+ and symmetric all extremities  Skin:   a few erythematous papules noted on arms back and legs.   Lymph nodes:   Cervical, supraclavicular, and axillary nodes normal  Neurologic:   ptosis noted on the left. Motor strength 5 out of 5     Psychiatric: Calm and cooperative         Labs on Admission:  Basic Metabolic Panel:  Recent Labs Lab 04/05/15 1113 04/07/15 1210  NA 135 137  K 3.8 3.3*  CL 101 100*  CO2 23 27  GLUCOSE 99 97  BUN 9 9  CREATININE 0.70 0.77  CALCIUM 8.5* 8.7*   Liver Function Tests:  Recent Labs Lab 04/05/15 1113  AST 59*  ALT 46  ALKPHOS 64  BILITOT 1.0  PROT 7.9  ALBUMIN 3.2*   No results for input(s): LIPASE, AMYLASE in the last 168 hours. No results for input(s): AMMONIA in the last 168 hours. CBC:  Recent Labs Lab 04/05/15 1113 04/07/15 1210  WBC 3.8* 2.7*  NEUTROABS 2.9 2.0  HGB 9.6* 9.5*  HCT 30.0* 29.5*  MCV 75.8* 75.8*  PLT 243 218   Cardiac Enzymes: No results for input(s): CKTOTAL, CKMB, CKMBINDEX, TROPONINI in the last 168 hours.  BNP (last 3 results)  Recent Labs  04/07/15 1210  BNP 8.6    ProBNP (last 3 results) No results for input(s): PROBNP in the last 8760 hours.  CBG: No results for input(s): GLUCAP in the last 168 hours.  Radiological Exams on Admission: Dg Chest 2 View  04/07/2015  CLINICAL DATA:  Pt c/o left-sided chest pain, SOB, cough, and fever x 3 days. Hx of HTN and GERD. Pt is a former smoker. EXAM: CHEST - 2 VIEW COMPARISON:  04/05/2015 FINDINGS: Low lung volumes. Linear subsegmental atelectasis in the lung bases as before. No overt edema. Heart size normal. No pneumothorax. No effusion. Spurring in the mid thoracic spine. IMPRESSION: Low volumes with bibasilar atelectasis.  No acute disease. Electronically Signed   By: Lucrezia Europe M.D.   On: 04/07/2015 12:01   Ct Angio Chest Pe W/cm &/or Wo Cm  04/07/2015  CLINICAL DATA:  SHOB AND SOME LEFT SIDED CHEST PAIN SINCE THIS AM 0500 RASH TO BACK AND ARM WITH RECENT ANTIBIOTIC STARTED Thursday PMH: HTN, GERD (gastroesophageal  reflux disease); Tumors; Knee injury; Depression EXAM: CT ANGIOGRAPHY CHEST WITH CONTRAST TECHNIQUE: Multidetector CT imaging of the chest was performed using the standard protocol during bolus administration of  intravenous contrast. Multiplanar CT image reconstructions and MIPs were obtained to evaluate the vascular anatomy. CONTRAST:  84mL OMNIPAQUE IOHEXOL 350 MG/ML SOLN COMPARISON:  03/22/2015 FINDINGS: Right arm IV contrast injection. The SVC is patent. Right atrium nondilated. RV is nondilated. Satisfactory opacification of pulmonary arteries noted, and there is no evidence of pulmonary emboli. Patent pulmonary veins. Adequate contrast opacification of the thoracic aorta with no evidence of dissection, aneurysm, or stenosis. There is classic 3-vessel brachiocephalic arch anatomy without proximal stenosis. No pleural or pericardial effusion. Prominent axillary, left cervical level 4 and supraclavicular lymph nodes, new since prior study. Subsegmental atelectasis in basilar segments of both lower lobes. Flowing osteophytes across several contiguous levels in the lower thoracic spine. Sternum intact. Borderline splenomegaly suspected, new. Remainder visualized portions of upper abdomen unremarkable. Review of the MIP images confirms the above findings. IMPRESSION: 1. Negative for acute PE or thoracic aortic dissection. 2. Progressive splenomegaly as well as prominence of bilateral axillary, left subclavian and cervical lymph nodes, possibly reactive but nonspecific. Follow-up suggested to exclude neoplasm or lymphoproliferative process. Electronically Signed   By: Lucrezia Europe M.D.   On: 04/07/2015 14:39    EKG: Tracing reviewed. Sinus tachycardia.  Assessment/Plan Principal Problem:   Dyspnea: Given myasthenia gravis and recent Cipro, I am concerned about myasthenia crisis. Her NIF is 36. Will admit to step down and do serial NIF's. Have consulted neurology. Will also check echocardiogram and proBNP and weight to rule out fluid overload. Active Problems:   Myasthenia gravis [facility]): See above. Continue outpatient regimen for now and await recommendations from neurology. She takes a liquid diet which I will  continue.   Obesity    E. coli UTI: Sensitivities pending. Stop Cipro. Will give Ceftin for a few more days.   Hypokalemia: Will replete IV.   Anemia: No evidence of bleeding. Will check Hemoccults of the stool. Last month, her hemoglobin was normal. Will check iron studies as it is microcytic.   Neutropenia ([place]): See above. Monospot pending.   Lymphadenopathy: Monospot pending. Outpatient follow-up with hematology recommended.   Splenomegaly   Code Status: Full Family Communication: Husband Disposition Plan: To step down unit. 5-7 days  Time spent: 70 minutes  [facility] [email or web address]

## 2015-04-07 NOTE — ED Notes (Signed)
Patient transported to X-ray 

## 2015-04-07 NOTE — ED Notes (Signed)
Pt reports being dx w/ UTI last week and started on antibiotics. Pt admits this morning awakening w/ a generalized rash to which she attributes to a possible allergic reaction. Pt also c/o generalized weakness, progressively worse and that her husband had to help her up stairs yesterday evening. This morning pt experienced chest pain and shortness of breath however states she took her rx medications pta and now is chest pain free and continues to experience mild shortness of breath. Pt speaking in complete sentences, no acute distress - erythremic rash to upper extremities and torso noted on exam.

## 2015-04-07 NOTE — ED Notes (Signed)
Pt ambulated to restroom independently however continues to express shortness of breath exacerbation w/ ambulation.

## 2015-04-07 NOTE — Progress Notes (Signed)
NIF performed per MD order best out of 3 with good effort was -38cm H2o. Vital Capacity was also performed per MD and best out of 2 was 0.770 Liters. MD at bedside and made aware.

## 2015-04-07 NOTE — ED Notes (Signed)
Patient transported to CT 

## 2015-04-07 NOTE — ED Notes (Signed)
SPO2 noted between 88-91% - Shawn, PA made aware - pt placed on 2L/min [place] oxygen. Will continue to monitor.

## 2015-04-07 NOTE — Consult Note (Signed)
Admission H&P    Chief Complaint: Increasing shortness of breath.  HPI: Frances Medina is an 55 y.o. female with recently diagnosed myasthenia gravis presenting with progressive worsening of shortness of breath over the past several days. She was diagnosed with urinary tract infection a few days ago and was started on Cipro. Patient noticed onset of shortness of breath and Cipro was started. Her primary problem with myasthenia gravis to this point is been dysphagia, which improved with Mestinon. Myasthenia gravis antibodies were positive (binding, blocking and modulating antibodies). She was given a 5 day course of IVIG following diagnosis and started on prednisone 20 mg per day. She was discharged on 03/27/2015. Oxygen saturation in the ED ranged from 88-96%. CT angiogram of the chest showed no signs of an air embolus. NIF in the ED was -38, and vital capacity was 0.77 L.  Past Medical History  Diagnosis Date  . GERD (gastroesophageal reflux disease)   . 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 Knee injury   . Depression   . Seasonal allergies     takes Zytrec  . Fibroid uterus     size of a dime  . Umbilical hernia     watching , no plans for surgery at present  . H/O hiatal hernia   . Hypertension     "went away when I stopped smoking"  . Anxiety   . Migraine     "maybe couple times/month" (03/20/2015)  . Arthritis     "knees" (03/20/2015)  . Osteoarthritis of right knee 08/30/2013  . Osteoarthritis of left knee 12/02/2013  . Bell's palsy     affecting left eye vision  . Myasthenia gravis ([place]) 2017    Past Surgical History  Procedure Laterality Date  . Cesarean section  1987; 1989  . Dilation and curettage of uterus    . Tubal ligation  1989  . Vaginal hysterectomy  1990's?    "apparently took out one of my ovaries at the time too cause one's missing"  . Partial knee arthroplasty Right 08/30/2013    Procedure: RIGHT UNICOMPARTMENTAL KNEE;  Surgeon: Johnny Bridge, MD;  Location: [facility];   Service: Orthopedics;  Laterality: Right;  . Colonoscopy with propofol N/A 09/19/2013    Procedure: COLONOSCOPY WITH PROPOFOL;  Surgeon: Ladene Artist, MD;  Location: [facility];  Service: Endoscopy;  Laterality: N/A;  . Partial knee arthroplasty Left 12/02/2013    Procedure: LEFT KNEE UNI ARTHROPLASTY;  Surgeon: Johnny Bridge, MD;  Location: [facility];  Service: Orthopedics;  Laterality: Left;  . Esophagogastroduodenoscopy (egd) with propofol N/A 03/21/2015    Procedure: ESOPHAGOGASTRODUODENOSCOPY (EGD) WITH PROPOFOL;  Surgeon: Gatha Mayer, MD;  Location: [facility];  Service: Endoscopy;  Laterality: N/A;    Family History  Problem Relation Age of Onset  . Heart disease Mother 13  . Hypertension Mother   . Stroke Father   . Hypertension Father   . Colon cancer Neg Hx   . Other Sister     Esophageal strictures  . Anemia Sister    Social History:  reports that she quit smoking about 2 years ago. Her smoking use included Cigarettes. She has a 20 pack-year smoking history. She has never used smokeless tobacco. She reports that she does not drink alcohol or use illicit drugs.  Allergies:  Allergies  Allergen Reactions  . Dicyclomine Hives  . Methocarbamol Hives  . Penicillins Hives    Has patient had a PCN  reaction causing immediate rash, facial/tongue/throat swelling, SOB or lightheadedness with hypotension: Yes Has patient had a PCN reaction causing severe rash involving mucus membranes or skin necrosis: No Has patient had a PCN reaction that required hospitalization No Has patient had a PCN reaction occurring within the last 10 years: No If all of the above answers are "NO", then may proceed with Cephalosporin use.   . Ciprofloxacin     Rash, shortness of breath  . Shrimp [Shellfish Allergy]     Medications Prior to Admission  Medication Sig Dispense Refill  . cetirizine (ZYRTEC) 10 MG tablet Take 10 mg by mouth daily as needed for allergies.    .  clonazePAM (KLONOPIN) 1 MG tablet Take 0.5-1 mg by mouth 2 (two) times daily as needed for anxiety.    [name] estradiol (ESTRACE) 0.5 MG tablet Take 0.5 mg by mouth daily.    [name] ibuprofen (ADVIL,MOTRIN) 400 MG tablet Take 400 mg by mouth every 6 (six) hours as needed for mild pain.    . phentermine 37.5 MG capsule Take 37.5 mg by mouth every morning.    . pyridostigmine (MESTINON) 60 MG tablet Take 1 tablet (60 mg total) by mouth every 8 (eight) hours. Generic OK 90 tablet 0  . sertraline (ZOLOFT) 100 MG tablet Take 50 mg by mouth every morning.     . ciprofloxacin (CIPRO) 500 MG tablet Take 1 tablet (500 mg total) by mouth 2 (two) times daily. 14 tablet 0    ROS: History obtained from the patient  General ROS: negative for - chills, fatigue, fever, night sweats, weight gain or weight loss Psychological ROS: negative for - behavioral disorder, hallucinations, memory difficulties, mood swings or suicidal ideation Ophthalmic ROS: negative for - blurry vision, double vision, eye pain or loss of vision ENT ROS: Dysphagia, as noted in present illness Allergy and Immunology ROS: negative for - hives or itchy/watery eyes Hematological and Lymphatic ROS: negative for - bleeding problems, bruising or swollen lymph nodes Endocrine ROS: negative for - galactorrhea, hair pattern changes, polydipsia/polyuria or temperature intolerance Respiratory ROS: As noted in present illness Cardiovascular ROS: negative for - chest pain, dyspnea on exertion, edema or irregular heartbeat Gastrointestinal ROS: negative for - abdominal pain, diarrhea, hematemesis, nausea/vomiting or stool incontinence Genito-Urinary ROS: negative for - dysuria, hematuria, incontinence or urinary frequency/urgency Musculoskeletal ROS: negative for - joint swelling or muscular weakness Neurological ROS: as noted in HPI Dermatological ROS: negative for rash and skin lesion changes  Physical Examination: Blood pressure 110/60, pulse 113,  temperature 98.7 F (37.1 C), temperature source Oral, resp. rate 26, SpO2 98 %.  HEENT-  Normocephalic, no lesions, without obvious abnormality.  Normal external eye and conjunctiva.  Normal TM's bilaterally.  Normal auditory canals and external ears. Normal external nose, mucus membranes and septum.  Normal pharynx. Neck supple with no masses, nodes, nodules or enlargement. Cardiovascular - tachycardic, normal S1 and S2, no S3 or S4 Lungs - tachypnea is noted, clear breath sounds throughout Abdomen - soft, non-tender; bowel sounds normal; no masses,  no organomegaly Extremities - no joint deformities, effusion, or inflammation and no edema  Neurologic Examination: Mental Status: Alert, oriented, moderately short of breath.  Speech fluent without evidence of aphasia. Able to follow commands without difficulty. Cranial Nerves: II-Visual fields were normal. III/IV/VI-Pupils were equal and reacted normally to light. Extraocular movements were full and conjugate with no diplopia in any field of gaze. Ptosis of left eye lid present.    V/VII-no facial numbness; moderate  weakness of orbicularis oculi bilaterally; mild weakness of orbicularis oris. VIII-normal. X-normal speech and symmetrical palatal movement. XI: trapezius strength/neck flexion strength normal bilaterally XII-midline tongue extension with normal strength. Motor: Minimal weakness of neck flexion; normal strength of neck extensors; normal strength of upper and lower extremities proximally and distally. Sensory: Normal throughout. Deep Tendon Reflexes: Trace to 1+ and symmetric. Plantars: Mute bilaterally Cerebellar: Normal finger-to-nose testing. Carotid auscultation: Normal  Results for orders placed or performed during the hospital encounter of 04/07/15 (from the past 48 hour(s))  Basic metabolic panel     Status: Abnormal   Collection Time: 04/07/15 12:10 PM  Result Value Ref Range   Sodium 137 135 - 145 mmol/L    Potassium 3.3 (L) 3.5 - 5.1 mmol/L   Chloride 100 (L) 101 - 111 mmol/L   CO2 27 22 - 32 mmol/L   Glucose, Bld 97 65 - 99 mg/dL   BUN 9 6 - 20 mg/dL   Creatinine, Ser 0.77 0.44 - 1.00 mg/dL   Calcium 8.7 (L) 8.9 - 10.3 mg/dL   GFR calc non Af Amer >60 >60 mL/min   GFR calc Af Amer >60 >60 mL/min    Comment: (NOTE) The eGFR has been calculated using the CKD EPI equation. This calculation has not been validated in all clinical situations. eGFR's persistently <60 mL/min signify possible Chronic Kidney Disease.    Anion gap 10 5 - 15  CBC with Differential     Status: Abnormal   Collection Time: 04/07/15 12:10 PM  Result Value Ref Range   WBC 2.7 (L) 4.0 - 10.5 K/uL   RBC 3.89 3.87 - 5.11 MIL/uL   Hemoglobin 9.5 (L) 12.0 - 15.0 g/dL   HCT 29.5 (L) 36.0 - 46.0 %   MCV 75.8 (L) 78.0 - 100.0 fL   MCH 24.4 (L) 26.0 - 34.0 pg   MCHC 32.2 30.0 - 36.0 g/dL   RDW 21.0 (H) 11.5 - 15.5 %   Platelets 218 150 - 400 K/uL   Neutrophils Relative % 73 %   Neutro Abs 2.0 1.7 - 7.7 K/uL   Lymphocytes Relative 23 %   Lymphs Abs 0.6 (L) 0.7 - 4.0 K/uL   Monocytes Relative 4 %   Monocytes Absolute 0.1 0.1 - 1.0 K/uL   Eosinophils Relative 0 %   Eosinophils Absolute 0.0 0.0 - 0.7 K/uL   Basophils Relative 0 %   Basophils Absolute 0.0 0.0 - 0.1 K/uL  Brain natriuretic peptide     Status: None   Collection Time: 04/07/15 12:10 PM  Result Value Ref Range   B Natriuretic Peptide 8.6 0.0 - 100.0 pg/mL  I-stat troponin, ED     Status: None   Collection Time: 04/07/15 12:15 PM  Result Value Ref Range   Troponin i, poc 0.00 0.00 - 0.08 ng/mL   Comment 3            Comment: Due to the release kinetics of cTnI, a negative result within the first hours of the onset of symptoms does not rule out myocardial infarction with certainty. If myocardial infarction is still suspected, repeat the test at appropriate intervals.   D-dimer, quantitative (not at [facility])     Status: Abnormal   Collection Time:  04/07/15 12:32 PM  Result Value Ref Range   D-Dimer, Quant 1.57 (H) 0.00 - 0.50 ug/mL-FEU    Comment: (NOTE) At the manufacturer cut-off of 0.50 ug/mL FEU, this assay has been documented to exclude PE with a sensitivity  and negative predictive value of 97 to 99%.  At this time, this assay has not been approved by the FDA to exclude DVT/VTE. Results should be correlated with clinical presentation.   Urinalysis, Routine w reflex microscopic (not at [facility])     Status: None   Collection Time: 04/07/15  1:49 PM  Result Value Ref Range   Color, Urine YELLOW YELLOW   APPearance CLEAR CLEAR   Specific Gravity, Urine 1.008 1.005 - 1.030   pH 6.0 5.0 - 8.0   Glucose, UA NEGATIVE NEGATIVE mg/dL   Hgb urine dipstick NEGATIVE NEGATIVE   Bilirubin Urine NEGATIVE NEGATIVE   Ketones, ur NEGATIVE NEGATIVE mg/dL   Protein, ur NEGATIVE NEGATIVE mg/dL   Nitrite NEGATIVE NEGATIVE   Leukocytes, UA NEGATIVE NEGATIVE    Comment: MICROSCOPIC NOT DONE ON URINES WITH NEGATIVE PROTEIN, BLOOD, LEUKOCYTES, NITRITE, OR GLUCOSE <1000 mg/dL.  POC occult blood, ED Provider will collect     Status: None   Collection Time: 04/07/15  2:02 PM  Result Value Ref Range   Fecal Occult Bld NEGATIVE NEGATIVE  Mononucleosis screen     Status: None   Collection Time: 04/07/15  5:10 PM  Result Value Ref Range   Mono Screen NEGATIVE NEGATIVE  I-Stat Troponin, ED (not at [facility])     Status: None   Collection Time: 04/07/15  5:32 PM  Result Value Ref Range   Troponin i, poc 0.00 0.00 - 0.08 ng/mL   Comment 3            Comment: Due to the release kinetics of cTnI, a negative result within the first hours of the onset of symptoms does not rule out myocardial infarction with certainty. If myocardial infarction is still suspected, repeat the test at appropriate intervals.    Dg Chest 2 View  04/07/2015  CLINICAL DATA:  Pt c/o left-sided chest pain, SOB, cough, and fever x 3 days. Hx of HTN and GERD. Pt is a former smoker.  EXAM: CHEST - 2 VIEW COMPARISON:  04/05/2015 FINDINGS: Low lung volumes. Linear subsegmental atelectasis in the lung bases as before. No overt edema. Heart size normal. No pneumothorax. No effusion. Spurring in the mid thoracic spine. IMPRESSION: Low volumes with bibasilar atelectasis.  No acute disease. Electronically Signed   By: Lucrezia Europe M.D.   On: 04/07/2015 12:01   Ct Angio Chest Pe W/cm &/or Wo Cm  04/07/2015  CLINICAL DATA:  SHOB AND SOME LEFT SIDED CHEST PAIN SINCE THIS AM 0500 RASH TO BACK AND ARM WITH RECENT ANTIBIOTIC STARTED Thursday PMH: HTN, GERD (gastroesophageal reflux disease); Tumors; Knee injury; Depression EXAM: CT ANGIOGRAPHY CHEST WITH CONTRAST TECHNIQUE: Multidetector CT imaging of the chest was performed using the standard protocol during bolus administration of intravenous contrast. Multiplanar CT image reconstructions and MIPs were obtained to evaluate the vascular anatomy. CONTRAST:  6m OMNIPAQUE IOHEXOL 350 MG/ML SOLN COMPARISON:  03/22/2015 FINDINGS: Right arm IV contrast injection. The SVC is patent. Right atrium nondilated. RV is nondilated. Satisfactory opacification of pulmonary arteries noted, and there is no evidence of pulmonary emboli. Patent pulmonary veins. Adequate contrast opacification of the thoracic aorta with no evidence of dissection, aneurysm, or stenosis. There is classic 3-vessel brachiocephalic arch anatomy without proximal stenosis. No pleural or pericardial effusion. Prominent axillary, left cervical level 4 and supraclavicular lymph nodes, new since prior study. Subsegmental atelectasis in basilar segments of both lower lobes. Flowing osteophytes across several contiguous levels in the lower thoracic spine. Sternum intact. Borderline splenomegaly suspected, new.  Remainder visualized portions of upper abdomen unremarkable. Review of the MIP images confirms the above findings. IMPRESSION: 1. Negative for acute PE or thoracic aortic dissection. 2. Progressive  splenomegaly as well as prominence of bilateral axillary, left subclavian and cervical lymph nodes, possibly reactive but nonspecific. Follow-up suggested to exclude neoplasm or lymphoproliferative process. Electronically Signed   By: Lucrezia Europe M.D.   On: 04/07/2015 14:39    Assessment/Plan 55 year old lady with recently diagnosed myasthenia gravis with dysphagia and progressive shortness of breath, possibly exacerbated by use of Cipro for urinary tract infection. Patient is tachypnea, with O2 saturations ranging from 94-100% since admission. ABG was obtained which was unremarkable except for PO2 of 68. There was no indication of CO2 retention. Patient was switched from nasal cannula to mask for O2 administration.  Recommendations: 1. Will obtain serial pulmonary function tests with FVC and NIF  2. We'll increase frequency Mestinon to 60 mg every 6 hours 3. Continue prednisone 20 mg per day 4. Patient may need a repeat course of IVIG or possibly a treatment course of plasmapheresis 5. Continue Ceftin for management of acute infectious process.  We will continue to follow this patient closely with you.  C.R. Nicole Kindred, [place] [facility] [phone]  04/07/2015, 8:19 PM

## 2015-04-08 ENCOUNTER — Encounter (HOSPITAL_COMMUNITY): Payer: Self-pay

## 2015-04-08 DIAGNOSIS — R0789 Other chest pain: Secondary | ICD-10-CM | POA: Diagnosis not present

## 2015-04-08 LAB — CBC WITH DIFFERENTIAL/PLATELET
Basophils Absolute: 0 10*3/uL (ref 0.0–0.1)
Basophils Relative: 1 %
Eosinophils Absolute: 0 10*3/uL (ref 0.0–0.7)
Eosinophils Relative: 0 %
HCT: 27.4 % — ABNORMAL LOW (ref 36.0–46.0)
Hemoglobin: 8.8 g/dL — ABNORMAL LOW (ref 12.0–15.0)
Lymphocytes Relative: 30 %
Lymphs Abs: 0.6 10*3/uL — ABNORMAL LOW (ref 0.7–4.0)
MCH: 24.6 pg — ABNORMAL LOW (ref 26.0–34.0)
MCHC: 32.1 g/dL (ref 30.0–36.0)
MCV: 76.8 fL — ABNORMAL LOW (ref 78.0–100.0)
Monocytes Absolute: 0.1 10*3/uL (ref 0.1–1.0)
Monocytes Relative: 5 %
Neutro Abs: 1.2 10*3/uL — ABNORMAL LOW (ref 1.7–7.7)
Neutrophils Relative %: 64 %
Platelets: 231 10*3/uL (ref 150–400)
RBC: 3.57 MIL/uL — ABNORMAL LOW (ref 3.87–5.11)
RDW: 21.5 % — ABNORMAL HIGH (ref 11.5–15.5)
WBC: 1.9 10*3/uL — ABNORMAL LOW (ref 4.0–10.5)

## 2015-04-08 LAB — IRON AND TIBC
Iron: 76 ug/dL (ref 28–170)
Saturation Ratios: 27 % (ref 10.4–31.8)
TIBC: 286 ug/dL (ref 250–450)
UIBC: 210 ug/dL

## 2015-04-08 LAB — VITAMIN B12: Vitamin B-12: 353 pg/mL (ref 180–914)

## 2015-04-08 LAB — URINE CULTURE: Culture: >=100000

## 2015-04-08 LAB — FERRITIN: Ferritin: 688 ng/mL — ABNORMAL HIGH (ref 11–307)

## 2015-04-08 LAB — FOLATE: Folate: 7.6 ng/mL (ref >5.9)

## 2015-04-08 LAB — TROPONIN I
Troponin I: <0.03 ng/mL (ref <0.031)
Troponin I: <0.03 ng/mL (ref <0.031)
Troponin I: <0.03 ng/mL (ref <0.031)

## 2015-04-08 MED ORDER — KETOROLAC TROMETHAMINE 30 MG/ML IJ SOLN
30.0000 mg | Freq: Once | INTRAMUSCULAR | Status: AC
  Administered 2015-04-08: 30 mg via INTRAVENOUS
  Filled 2015-04-08: qty 1

## 2015-04-08 MED ORDER — PYRIDOSTIGMINE BROMIDE 60 MG PO TABS
60.0000 mg | ORAL_TABLET | Freq: Four times a day (QID) | ORAL | Status: DC
  Administered 2015-04-09 – 2015-04-13 (×18): 60 mg via ORAL
  Filled 2015-04-08 (×22): qty 1

## 2015-04-08 MED ORDER — CETYLPYRIDINIUM CHLORIDE 0.05 % MT LIQD
7.0000 mL | Freq: Two times a day (BID) | OROMUCOSAL | Status: DC
  Administered 2015-04-08 – 2015-04-10 (×4): 7 mL via OROMUCOSAL

## 2015-04-08 NOTE — Progress Notes (Signed)
NIF > -60 VC 1.5 liters. Good pt effort.

## 2015-04-08 NOTE — Progress Notes (Signed)
Subjective: Still somewhat SOB when laying down.   Exam: Filed Vitals:   04/08/15 1200 04/08/15 1600  BP: 101/81 113/65  Pulse: 98 74  Temp: 97.9 F (36.6 C) 97.9 F (36.6 C)  Resp: 20 22   Gen: In bed, NAD Resp: non-labored breathing, no acute distress Abd: soft, nt  Neuro: MS: awake, alert.  CN:L ptosis, EOMI  Pertinent Labs: NIF -60  Impression:  [name] with myasthenic exacerbation 2/2 cipro, observing off cipro. I hope to avoid IVIG or PLEX and NIF appears stable at this time.   Recommendations: 1)increase mestinon to 60mg  q6h 2) continue prednisone 3) will continue to follow.   Roland Rack, MD [facility] [phone]  If 7pm- 7am, please page neurology on call as listed in [place].

## 2015-04-08 NOTE — Procedures (Signed)
1.0 VC , -60 NIF good pt effort.

## 2015-04-08 NOTE — Progress Notes (Signed)
[facility] PROGRESS NOTE  Frances Medina [id number] DOB: November 17, 1960 DOA: 04/07/2015 PCP: No primary care provider on file.  Overnight: Developed pleuritic CP radiates to back.  Assessment/Plan:  Principal Problem:   Dyspnea: Likely masthenia crisis. BNP is normal and weight down. Neurology has started prednisone and adjusted Mestinon. Continue NIF and SDU. Appreciate neurology's assistance.  Active Problems:   Obesity   Myasthenia gravis ([place])   E. coli UTI: Sensitivities still pending. Now on Ceftin.   Hypokalemia: Recheck tomorrow.    Anemia: Anemia panel normal.    Neutropenia ([place]): Etiology unclear. Question viral. Monitor.    Lymphadenopathy   Splenomegaly   Chest wall pain: CTA chest yesterday negative. Reproducible. Will try a dose of Toradol.    Code Status:  full Family Communication:   husband at bedside  Disposition Plan:    Consultants:   neurology   Procedures:     Antibiotics:   Ceftin   HPI/Subjective:  dyspnea "comes and goes". Has positional and pleuritic central and left lower chest pain which radiates to her back.   Objective: Filed Vitals:   04/08/15 0009 04/08/15 0412  BP: 121/74 135/85  Pulse: 99 70  Temp: 98.1 F (36.7 C) 97.7 F (36.5 C)  Resp: 21 22    Intake/Output Summary (Last 24 hours) at 04/08/15 0827 Last data filed at 04/08/15 0600  Gross per 24 hour  Intake 2301.33 ml  Output    200 ml  Net 2101.33 ml   Filed Weights   04/07/15 2000 04/08/15 0412  Weight: 123 kg (271 lb 2.7 oz) 123.333 kg (271 lb 14.4 oz)    Exam:   General:   comfortable at rest. With talking appears slightly dyspneic. Appears more comfortable overall than yesterday.   Cardiovascular:  regular rate rhythm without murmurs gallops rubs. Reproducible chest wall tenderness in the lower sternal and lower left chest area under breast.   Respiratory:  clear to auscultation bilaterally without wheezes rhonchi or rales   Abdomen:  obese,  soft nontender nondistended   Ext:  no pitting edema.  Basic Metabolic Panel:  Recent Labs Lab 04/05/15 1113 04/07/15 1210 04/07/15 2031  NA 135 137  --   K 3.8 3.3*  --   CL 101 100*  --   CO2 23 27  --   GLUCOSE 99 97  --   BUN 9 9  --   CREATININE 0.70 0.77 0.69  CALCIUM 8.5* 8.7*  --    Liver Function Tests:  Recent Labs Lab 04/05/15 1113  AST 59*  ALT 46  ALKPHOS 64  BILITOT 1.0  PROT 7.9  ALBUMIN 3.2*   No results for input(s): LIPASE, AMYLASE in the last 168 hours. No results for input(s): AMMONIA in the last 168 hours. CBC:  Recent Labs Lab 04/05/15 1113 04/07/15 1210 04/07/15 2031 04/08/15 0207  WBC 3.8* 2.7* 1.9* 1.9*  NEUTROABS 2.9 2.0  --  1.2*  HGB 9.6* 9.5* 8.8* 8.8*  HCT 30.0* 29.5* 27.5* 27.4*  MCV 75.8* 75.8* 76.2* 76.8*  PLT 243 218 213 231   Cardiac Enzymes:  Recent Labs Lab 04/08/15 0555  TROPONINI <0.03   BNP (last 3 results)  Recent Labs  04/07/15 1210  BNP 8.6    ProBNP (last 3 results) No results for input(s): PROBNP in the last 8760 hours.  CBG: No results for input(s): GLUCAP in the last 168 hours.  Recent Results (from the past 240 hour(s))  Urine culture     Status:  None (Preliminary result)   Collection Time: 04/05/15 11:37 AM  Result Value Ref Range Status   Specimen Description URINE, CLEAN CATCH  Final   Special Requests NONE  Final   Culture >=100,000 COLONIES/mL ESCHERICHIA COLI  Final   Report Status PENDING  Incomplete  MRSA PCR Screening     Status: None   Collection Time: 04/07/15  8:34 PM  Result Value Ref Range Status   MRSA by PCR NEGATIVE NEGATIVE Final    Comment:        The GeneXpert MRSA Assay (FDA approved for NASAL specimens only), is one component of a comprehensive MRSA colonization surveillance program. It is not intended to diagnose MRSA infection nor to guide or monitor treatment for MRSA infections.      Studies: Dg Chest 2 View  04/07/2015  CLINICAL DATA:  Pt c/o  left-sided chest pain, SOB, cough, and fever x 3 days. Hx of HTN and GERD. Pt is a former smoker. EXAM: CHEST - 2 VIEW COMPARISON:  04/05/2015 FINDINGS: Low lung volumes. Linear subsegmental atelectasis in the lung bases as before. No overt edema. Heart size normal. No pneumothorax. No effusion. Spurring in the mid thoracic spine. IMPRESSION: Low volumes with bibasilar atelectasis.  No acute disease. Electronically Signed   By: Lucrezia Europe M.D.   On: 04/07/2015 12:01   Ct Angio Chest Pe W/cm &/or Wo Cm  04/07/2015  CLINICAL DATA:  SHOB AND SOME LEFT SIDED CHEST PAIN SINCE THIS AM 0500 RASH TO BACK AND ARM WITH RECENT ANTIBIOTIC STARTED Thursday PMH: HTN, GERD (gastroesophageal reflux disease); Tumors; Knee injury; Depression EXAM: CT ANGIOGRAPHY CHEST WITH CONTRAST TECHNIQUE: Multidetector CT imaging of the chest was performed using the standard protocol during bolus administration of intravenous contrast. Multiplanar CT image reconstructions and MIPs were obtained to evaluate the vascular anatomy. CONTRAST:  7mL OMNIPAQUE IOHEXOL 350 MG/ML SOLN COMPARISON:  03/22/2015 FINDINGS: Right arm IV contrast injection. The SVC is patent. Right atrium nondilated. RV is nondilated. Satisfactory opacification of pulmonary arteries noted, and there is no evidence of pulmonary emboli. Patent pulmonary veins. Adequate contrast opacification of the thoracic aorta with no evidence of dissection, aneurysm, or stenosis. There is classic 3-vessel brachiocephalic arch anatomy without proximal stenosis. No pleural or pericardial effusion. Prominent axillary, left cervical level 4 and supraclavicular lymph nodes, new since prior study. Subsegmental atelectasis in basilar segments of both lower lobes. Flowing osteophytes across several contiguous levels in the lower thoracic spine. Sternum intact. Borderline splenomegaly suspected, new. Remainder visualized portions of upper abdomen unremarkable. Review of the MIP images confirms the  above findings. IMPRESSION: 1. Negative for acute PE or thoracic aortic dissection. 2. Progressive splenomegaly as well as prominence of bilateral axillary, left subclavian and cervical lymph nodes, possibly reactive but nonspecific. Follow-up suggested to exclude neoplasm or lymphoproliferative process. Electronically Signed   By: Lucrezia Europe M.D.   On: 04/07/2015 14:39    Scheduled Meds: . antiseptic oral rinse  7 mL Mouth Rinse BID  . cefUROXime  500 mg Oral BID WC  . enoxaparin (LOVENOX) injection  40 mg Subcutaneous Q24H  . estradiol  0.5 mg Oral Daily  . feeding supplement (ENSURE ENLIVE)  237 mL Oral BID BM  . ketorolac  30 mg Intravenous Once  . pyridostigmine  60 mg Oral 3 times per day  . senna-docusate  2 tablet Oral QHS  . sertraline  50 mg Oral q morning - 10a  . sodium chloride flush  3 mL Intravenous Q12H  .  sodium chloride flush  3 mL Intravenous Q12H   Continuous Infusions:   Time spent: 35 minutes  [facility] [email or web address], password [facility] 04/08/2015, 8:27 AM  LOS: 1 day

## 2015-04-08 NOTE — Plan of Care (Signed)
Problem: Safety: Goal: Ability to remain free from injury will improve Outcome: Progressing Patient exhibited teachback when referring to the fact that she must call nursing via phone or callbell before she attempts to get out of bed for any reason.  Problem: Pain Managment: Goal: General experience of comfort will improve Outcome: Progressing Patient has remained pain free since last dose of PRN pain medication.

## 2015-04-08 NOTE — Progress Notes (Signed)
[facility] progress note. Chief complaint. Chest pain. History of present illness. This 55 year old female admitted with complaints of dyspnea. She has no history of coronary artery disease but does have a listed history of GERD. She complained to nursing of chest pain. 12-lead EKG was obtained and this indicates normal sinus rhythm without evidence of acute ischemia. Did come to the bedside to evaluate the patient. I found her alert and in no distress. She described sharp pain that started in her back and then radiated to her sternal area. She denies any associated nausea or diaphoresis. Physical exam. Vital signs. Temperature 97.7, pulse 70, respiration 22, blood pressure 135/85. O2 sats 100%. General appearance. Obese female who is alert and in no distress. Cardiac. Heart sounds distant. Rate and rhythm regular. Lungs. Breath sounds are clear. Abdomen. Soft and obese with positive bowel sounds. Impression/plan. Problem #1. Chest pain. Per patient description this sounds more like reflux symptomology cardiac etiology. The patient now states she is pain-free after repositioning. Will obtain troponins now and then every 6 hours for a total of 3 sets. We'll repeat an EKG in 6 hours to evaluate for any changes.

## 2015-04-09 ENCOUNTER — Inpatient Hospital Stay (HOSPITAL_COMMUNITY): Payer: BC Managed Care – PPO

## 2015-04-09 ENCOUNTER — Telehealth (HOSPITAL_BASED_OUTPATIENT_CLINIC_OR_DEPARTMENT_OTHER): Payer: Self-pay | Admitting: Emergency Medicine

## 2015-04-09 ENCOUNTER — Encounter (HOSPITAL_COMMUNITY): Payer: Self-pay

## 2015-04-09 DIAGNOSIS — R0600 Dyspnea, unspecified: Secondary | ICD-10-CM

## 2015-04-09 LAB — CBC WITH DIFFERENTIAL/PLATELET
Basophils Absolute: 0 10*3/uL (ref 0.0–0.1)
Basophils Relative: 0 %
Eosinophils Absolute: 0 10*3/uL (ref 0.0–0.7)
Eosinophils Relative: 0 %
HCT: 26.9 % — ABNORMAL LOW (ref 36.0–46.0)
Hemoglobin: 8.6 g/dL — ABNORMAL LOW (ref 12.0–15.0)
Lymphocytes Relative: 44 %
Lymphs Abs: 1.6 10*3/uL (ref 0.7–4.0)
MCH: 24.8 pg — ABNORMAL LOW (ref 26.0–34.0)
MCHC: 32 g/dL (ref 30.0–36.0)
MCV: 77.5 fL — ABNORMAL LOW (ref 78.0–100.0)
Monocytes Absolute: 0.3 10*3/uL (ref 0.1–1.0)
Monocytes Relative: 8 %
Neutro Abs: 1.8 10*3/uL (ref 1.7–7.7)
Neutrophils Relative %: 48 %
Platelets: 235 10*3/uL (ref 150–400)
RBC: 3.47 MIL/uL — ABNORMAL LOW (ref 3.87–5.11)
RDW: 21.9 % — ABNORMAL HIGH (ref 11.5–15.5)
WBC: 3.7 10*3/uL — ABNORMAL LOW (ref 4.0–10.5)

## 2015-04-09 LAB — COMPREHENSIVE METABOLIC PANEL
ALT: 35 U/L (ref 14–54)
AST: 54 U/L — ABNORMAL HIGH (ref 15–41)
Albumin: 2.9 g/dL — ABNORMAL LOW (ref 3.5–5.0)
Alkaline Phosphatase: 54 U/L (ref 38–126)
Anion gap: 8 (ref 5–15)
BUN: 9 mg/dL (ref 6–20)
CO2: 28 mmol/L (ref 22–32)
Calcium: 8.2 mg/dL — ABNORMAL LOW (ref 8.9–10.3)
Chloride: 104 mmol/L (ref 101–111)
Creatinine, Ser: 0.58 mg/dL (ref 0.44–1.00)
GFR calc Af Amer: >60 mL/min (ref >60)
GFR calc non Af Amer: >60 mL/min (ref >60)
Glucose, Bld: 86 mg/dL (ref 65–99)
Potassium: 3.4 mmol/L — ABNORMAL LOW (ref 3.5–5.1)
Sodium: 140 mmol/L (ref 135–145)
Total Bilirubin: 0.9 mg/dL (ref 0.3–1.2)
Total Protein: 7 g/dL (ref 6.5–8.1)

## 2015-04-09 LAB — URINE CULTURE: Culture: >=100000

## 2015-04-09 LAB — GLUCOSE, CAPILLARY: Glucose-Capillary: 94 mg/dL (ref 65–99)

## 2015-04-09 MED ORDER — MELOXICAM 7.5 MG PO TABS
15.0000 mg | ORAL_TABLET | Freq: Every day | ORAL | Status: AC
  Administered 2015-04-10 – 2015-04-12 (×3): 15 mg via ORAL
  Filled 2015-04-09: qty 1
  Filled 2015-04-09 (×2): qty 2
  Filled 2015-04-09: qty 1

## 2015-04-09 MED ORDER — POTASSIUM CHLORIDE 10 MEQ/100ML IV SOLN
10.0000 meq | INTRAVENOUS | Status: AC
  Administered 2015-04-09 (×2): 10 meq via INTRAVENOUS
  Filled 2015-04-09 (×2): qty 100

## 2015-04-09 MED ORDER — PERFLUTREN LIPID MICROSPHERE
INTRAVENOUS | Status: AC
  Administered 2015-04-09: 1 mL
  Filled 2015-04-09: qty 10

## 2015-04-09 NOTE — Telephone Encounter (Addendum)
Post ED Visit - Positive Culture Follow-up  Culture report reviewed by antimicrobial stewardship pharmacist:  [x]  Elenor Quinones, Pharm.D. []  Heide Guile, Pharm.D., BCPS []  Parks Neptune, Pharm.D. []  Alycia Rossetti, Pharm.D., BCPS []  [place], [place].D., BCPS, AAHIVP []  Legrand Como, Pharm.D., BCPS, AAHIVP []  Milus Glazier, Pharm.D. []  Rob Evette Doffing, Pharm.D.  Positive urine culture E. coli Treated with ciprofloxacin, admitted to hospital and started on Ceftin no further patient follow-up is required at this time.  Hazle Nordmann 04/09/2015, 9:06 AM

## 2015-04-09 NOTE — Progress Notes (Signed)
Utilization Review Completed.  

## 2015-04-09 NOTE — Progress Notes (Signed)
NIF -80. VC 0.7L. Good patient effort.

## 2015-04-09 NOTE — Plan of Care (Signed)
Problem: Safety: Goal: Ability to remain free from injury will improve Outcome: Progressing Pt understands to call for help when getting out of bed. No falls or injuries this shift. Patient educated about fall prevention. Pt verbalized understanding.    

## 2015-04-09 NOTE — Progress Notes (Signed)
  Echocardiogram 2D Echocardiogram with Definity has been performed.  Tresa Res 04/09/2015, 1:38 PM

## 2015-04-09 NOTE — Progress Notes (Signed)
Initial Nutrition Assessment  DOCUMENTATION CODES:   Morbid obesity  INTERVENTION:    Continue Ensure Enlive po BID, each supplement provides 350 kcal and 20 grams of protein  NUTRITION DIAGNOSIS:   Increased nutrient needs related to acute illness as evidenced by estimated needs  GOAL:   Patient will meet greater than or equal to 90% of their needs  MONITOR:   PO intake, Supplement acceptance, Labs, Weight trends, I & O's  REASON FOR ASSESSMENT:   Malnutrition Screening Tool  ASSESSMENT:   55 y.o. female with recently diagnosed myasthenia gravis presenting with progressive worsening of shortness of breath over the past several days. She was diagnosed with urinary tract infection a few days ago and was started on Cipro. Patient noticed onset of shortness of breath and Cipro was started. Her primary problem with myasthenia gravis to this point is been dysphagia, which improved with Mestinon. Myasthenia gravis antibodies were positive (binding, blocking and modulating antibodies). She was given a 5 day course of IVIG following diagnosis and started on prednisone 20 mg per day. She was discharged on 03/27/2015. Oxygen saturation in the ED ranged from 88-96%. CT angiogram of the chest showed no signs of an air embolus. NIF in the ED was -38, and vital capacity was 0.77 .  Patient reports she really hasn't been able to eat anything.  Appetite has been limited.  She is currently on a Full Liquid diet.  PO intake 50% per flowsheet records.  Weight down from 04/05/15, however, this is likely due to fluid losses.  Drinking chocolate Ensure Enlive supplement upon RD visit -- has ordered and likes them.  Nutrition focused physical exam completed.  No muscle or subcutaneous fat depletion noticed.  Diet Order:  Diet full liquid Room service appropriate?: Yes; Fluid consistency:: Thin  Skin:  Reviewed, no issues  Last BM:  2/6  Height:   Ht Readings from Last 1 Encounters:  04/07/15 5'  (1.524 m)    Weight:   Wt Readings from Last 1 Encounters:  04/09/15 270 lb 3.2 oz (122.562 kg)    Wt Readings from Last 10 Encounters:  04/09/15 270 lb 3.2 oz (122.562 kg)  04/05/15 281 lb (127.461 kg)  03/19/15 281 lb (127.461 kg)  03/16/14 286 lb (129.729 kg)  12/02/13 289 lb (131.09 kg)  09/19/13 280 lb (127.007 kg)  08/19/13 280 lb (127.007 kg)  08/17/13 279 lb 9.6 oz (126.826 kg)  08/01/13 277 lb 8 oz (125.873 kg)  07/21/13 279 lb (126.554 kg)    Ideal Body Weight:  45.4 kg  BMI:  Body mass index is 52.77 kg/(m^2).  Estimated Nutritional Needs:   Kcal:  2100-2300  Protein:  110-120 gm  Fluid:  2.1-2.3 L  EDUCATION NEEDS:   No education needs identified at this time  Arthur Holms, RD, LDN Pager #: [phone] After-Hours Pager #: [phone]

## 2015-04-09 NOTE — Progress Notes (Signed)
NIF -80

## 2015-04-09 NOTE — Plan of Care (Signed)
Problem: Pain Managment: Goal: General experience of comfort will improve Outcome: Progressing Pt complained of headache during this shift. Headache relieved by tylenol. Will continue to monitor

## 2015-04-09 NOTE — Progress Notes (Signed)
[facility] PROGRESS NOTE  JALILAH HAWKEN [id number] DOB: 1960/03/16 DOA: 04/07/2015 PCP: No primary care provider on file.  Overnight: stable  Assessment/Plan:  Principal Problem:   Dyspnea: secondary to myasthenia crisis. NIF -60, improving. Still dyspneic even repositioning in bed. Continue SDU. Active Problems:   Obesity   Myasthenia gravis ([place])   E. coli UTI: Sensitivities to most. Continue ceftin to complete 5 days total   Hypokalemia: still low. replete   Anemia: Anemia panel normal. stable   Neutropenia ([place]): improving   Lymphadenopathy   Splenomegaly   Chest wall pain: meloxicam x3 days   Code Status:  full Family Communication:   husband at bedside  Disposition Plan:    Consultants:   neurology   Procedures:     Antibiotics:   Ceftin   HPI/Subjective: Still with positional CP. Got up to bathroom and became very SOB  Objective: Filed Vitals:   04/09/15 0628 04/09/15 0727  BP: 126/75 126/75  Pulse:  64  Temp:  97.7 F (36.5 C)  Resp:  20   No intake or output data in the 24 hours ending 04/09/15 0820 Filed Weights   04/07/15 2000 04/08/15 0412 04/09/15 0500  Weight: 123 kg (271 lb 2.7 oz) 123.333 kg (271 lb 14.4 oz) 122.562 kg (270 lb 3.2 oz)    Exam:   General:   comfortable at rest. Becomes winded when repositioning  Cardiovascular:  regular rate rhythm without murmurs gallops rubs. Reproducible chest wall tenderness in the lower sternal and lower left chest area under breast.   Respiratory:  clear to auscultation bilaterally without wheezes rhonchi or rales   Abdomen:  obese, soft nontender nondistended   Ext:  no pitting edema.  Basic Metabolic Panel:  Recent Labs Lab 04/05/15 1113 04/07/15 1210 04/07/15 2031 04/09/15 0659  NA 135 137  --  140  K 3.8 3.3*  --  3.4*  CL 101 100*  --  104  CO2 23 27  --  28  GLUCOSE 99 97  --  86  BUN 9 9  --  9  CREATININE 0.70 0.77 0.69 0.58  CALCIUM 8.5* 8.7*  --  8.2*    Liver Function Tests:  Recent Labs Lab 04/05/15 1113 04/09/15 0659  AST 59* 54*  ALT 46 35  ALKPHOS 64 54  BILITOT 1.0 0.9  PROT 7.9 7.0  ALBUMIN 3.2* 2.9*   No results for input(s): LIPASE, AMYLASE in the last 168 hours. No results for input(s): AMMONIA in the last 168 hours. CBC:  Recent Labs Lab 04/05/15 1113 04/07/15 1210 04/07/15 2031 04/08/15 0207 04/09/15 0659  WBC 3.8* 2.7* 1.9* 1.9* 3.7*  NEUTROABS 2.9 2.0  --  1.2* PENDING  HGB 9.6* 9.5* 8.8* 8.8* 8.6*  HCT 30.0* 29.5* 27.5* 27.4* 26.9*  MCV 75.8* 75.8* 76.2* 76.8* 77.5*  PLT 243 218 213 231 235   Cardiac Enzymes:  Recent Labs Lab 04/08/15 0555 04/08/15 1053 04/08/15 1657  TROPONINI <0.03 <0.03 <0.03   BNP (last 3 results)  Recent Labs  04/07/15 1210  BNP 8.6    ProBNP (last 3 results) No results for input(s): PROBNP in the last 8760 hours.  CBG: No results for input(s): GLUCAP in the last 168 hours.  Recent Results (from the past 240 hour(s))  Urine culture     Status: None   Collection Time: 04/05/15 11:37 AM  Result Value Ref Range Status   Specimen Description URINE, CLEAN CATCH  Final   Special Requests NONE  Final   Culture   Final    >=100,000 COLONIES/mL ESCHERICHIA COLI 80,000 COLONIES/ml ESCHERICHIA COLI    Report Status 04/08/2015 FINAL  Final   Organism ID, Bacteria ESCHERICHIA COLI  Final   Organism ID, Bacteria ESCHERICHIA COLI  Final      Susceptibility   Escherichia coli - MIC*    AMPICILLIN >=32 RESISTANT Resistant     CEFAZOLIN <=4 SENSITIVE Sensitive     CEFTRIAXONE <=1 SENSITIVE Sensitive     CIPROFLOXACIN <=0.25 SENSITIVE Sensitive     GENTAMICIN >=16 RESISTANT Resistant     IMIPENEM <=0.25 SENSITIVE Sensitive     NITROFURANTOIN <=16 SENSITIVE Sensitive     TRIMETH/SULFA <=20 SENSITIVE Sensitive     AMPICILLIN/SULBACTAM 16 INTERMEDIATE Intermediate     PIP/TAZO <=4 SENSITIVE Sensitive    Escherichia coli - MIC*    AMPICILLIN 4 SENSITIVE Sensitive      CEFAZOLIN <=4 SENSITIVE Sensitive     CEFTRIAXONE <=1 SENSITIVE Sensitive     CIPROFLOXACIN >=4 RESISTANT Resistant     GENTAMICIN <=1 SENSITIVE Sensitive     IMIPENEM <=0.25 SENSITIVE Sensitive     NITROFURANTOIN <=16 SENSITIVE Sensitive     TRIMETH/SULFA <=20 SENSITIVE Sensitive     AMPICILLIN/SULBACTAM <=2 SENSITIVE Sensitive     PIP/TAZO <=4 SENSITIVE Sensitive     * >=100,000 COLONIES/mL ESCHERICHIA COLI    80,000 COLONIES/ml ESCHERICHIA COLI  Urine culture     Status: None (Preliminary result)   Collection Time: 04/07/15  1:49 PM  Result Value Ref Range Status   Specimen Description URINE, CLEAN CATCH  Final   Special Requests NONE  Final   Culture >=100,000 COLONIES/mL GRAM NEGATIVE RODS  Final   Report Status PENDING  Incomplete  MRSA PCR Screening     Status: None   Collection Time: 04/07/15  8:34 PM  Result Value Ref Range Status   MRSA by PCR NEGATIVE NEGATIVE Final    Comment:        The GeneXpert MRSA Assay (FDA approved for NASAL specimens only), is one component of a comprehensive MRSA colonization surveillance program. It is not intended to diagnose MRSA infection nor to guide or monitor treatment for MRSA infections.      Studies: Dg Chest 2 View  04/07/2015  CLINICAL DATA:  Pt c/o left-sided chest pain, SOB, cough, and fever x 3 days. Hx of HTN and GERD. Pt is a former smoker. EXAM: CHEST - 2 VIEW COMPARISON:  04/05/2015 FINDINGS: Low lung volumes. Linear subsegmental atelectasis in the lung bases as before. No overt edema. Heart size normal. No pneumothorax. No effusion. Spurring in the mid thoracic spine. IMPRESSION: Low volumes with bibasilar atelectasis.  No acute disease. Electronically Signed   By: Lucrezia Europe M.D.   On: 04/07/2015 12:01   Ct Angio Chest Pe W/cm &/or Wo Cm  04/07/2015  CLINICAL DATA:  SHOB AND SOME LEFT SIDED CHEST PAIN SINCE THIS AM 0500 RASH TO BACK AND ARM WITH RECENT ANTIBIOTIC STARTED Thursday PMH: HTN, GERD (gastroesophageal reflux  disease); Tumors; Knee injury; Depression EXAM: CT ANGIOGRAPHY CHEST WITH CONTRAST TECHNIQUE: Multidetector CT imaging of the chest was performed using the standard protocol during bolus administration of intravenous contrast. Multiplanar CT image reconstructions and MIPs were obtained to evaluate the vascular anatomy. CONTRAST:  4mL OMNIPAQUE IOHEXOL 350 MG/ML SOLN COMPARISON:  03/22/2015 FINDINGS: Right arm IV contrast injection. The SVC is patent. Right atrium nondilated. RV is nondilated. Satisfactory opacification of pulmonary arteries noted, and there is no evidence  of pulmonary emboli. Patent pulmonary veins. Adequate contrast opacification of the thoracic aorta with no evidence of dissection, aneurysm, or stenosis. There is classic 3-vessel brachiocephalic arch anatomy without proximal stenosis. No pleural or pericardial effusion. Prominent axillary, left cervical level 4 and supraclavicular lymph nodes, new since prior study. Subsegmental atelectasis in basilar segments of both lower lobes. Flowing osteophytes across several contiguous levels in the lower thoracic spine. Sternum intact. Borderline splenomegaly suspected, new. Remainder visualized portions of upper abdomen unremarkable. Review of the MIP images confirms the above findings. IMPRESSION: 1. Negative for acute PE or thoracic aortic dissection. 2. Progressive splenomegaly as well as prominence of bilateral axillary, left subclavian and cervical lymph nodes, possibly reactive but nonspecific. Follow-up suggested to exclude neoplasm or lymphoproliferative process. Electronically Signed   By: Lucrezia Europe M.D.   On: 04/07/2015 14:39    Scheduled Meds: . antiseptic oral rinse  7 mL Mouth Rinse BID  . cefUROXime  500 mg Oral BID WC  . enoxaparin (LOVENOX) injection  40 mg Subcutaneous Q24H  . estradiol  0.5 mg Oral Daily  . feeding supplement (ENSURE ENLIVE)  237 mL Oral BID BM  . potassium chloride  10 mEq Intravenous Q1 Hr x 2  .  pyridostigmine  60 mg Oral 4 times per day  . senna-docusate  2 tablet Oral QHS  . sertraline  50 mg Oral q morning - 10a  . sodium chloride flush  3 mL Intravenous Q12H  . sodium chloride flush  3 mL Intravenous Q12H   Continuous Infusions:   Time spent: 35 minutes  [facility] [email or web address], password [facility] 04/09/2015, 8:20 AM  LOS: 2 days

## 2015-04-09 NOTE — Progress Notes (Signed)
Subjective: Feels improved but still somewhat SOB when laying down.   Exam: Filed Vitals:   04/09/15 0500 04/09/15 0628  BP: 99/52 126/75  Pulse: 71   Temp: 97.6 F (36.4 C)   Resp:     Gen: In bed, NAD Resp: non-labored breathing, no acute distress Abd: soft, nt  Neuro: MS: awake, alert.  CN:L ptosis, EOMI  Pertinent Labs: NIF -60, VC 1.0  Impression:  [name] with myasthenic exacerbation 2/2 cipro, observing off cipro. I hope to avoid IVIG or PLEX and NIF appears stable at this time.   Recommendations: 1)increase mestinon to 60mg  q6h 2) continue prednisone 3) will continue to follow.   Jim Like, DO [facility] [phone]  If 7pm- 7am, please page neurology on call as listed in [place].   If 7pm- 7am, please page neurology on call as listed in [place].

## 2015-04-10 DIAGNOSIS — E876 Hypokalemia: Secondary | ICD-10-CM

## 2015-04-10 MED ORDER — PREDNISONE 20 MG PO TABS
20.0000 mg | ORAL_TABLET | Freq: Every day | ORAL | Status: DC
  Administered 2015-04-11 – 2015-04-12 (×2): 20 mg via ORAL
  Filled 2015-04-10 (×3): qty 1

## 2015-04-10 NOTE — Care Management Note (Signed)
Case Management Note  Patient Details  Name: DESANI RUDIN MRN: [id number] Date of Birth: 08-01-1960  Subjective/Objective:                    Action/Plan: Patient was admitted with shortness of breath. Lives at home with husband. Will follow for discharge needs.  Expected Discharge Date:                  Expected Discharge Plan:     In-House Referral:     Discharge planning Services     Post Acute Care Choice:    Choice offered to:     DME Arranged:    DME Agency:     HH Arranged:    HH Agency:     Status of Service:  In process, will continue to follow  Medicare Important Message Given:    Date Medicare IM Given:    Medicare IM give by:    Date Additional Medicare IM Given:    Additional Medicare Important Message give by:     If discussed at [place] of Stay Meetings, dates discussed:    Additional CommentsRolm Baptise, RN 04/10/2015, 1:52 PM [phone]

## 2015-04-10 NOTE — Progress Notes (Signed)
Subjective: Feels improved but continues to feel short of breath.   NIF -80, VC 0.7L  Exam: Filed Vitals:   04/10/15 0035 04/10/15 0346  BP: 130/87 132/89  Pulse: 75 77  Temp: 98.4 F (36.9 C) 98 F (36.7 C)  Resp: 16 19   Gen: In bed, NAD Resp: non-labored breathing, no acute distress Abd: soft, nt  Neuro: MS: awake, alert.  CN:L ptosis, EOMI  Pertinent Labs: NIF -80, VC 0.7  Impression:  [name] with myasthenic exacerbation 2/2 cipro, observing off cipro. I hope to avoid IVIG or PLEX and NIF appears stable at this time. Will continue to monitor for now, consider IVIG if no further improvement in dyspnea.   Recommendations: 1)continue mestinon to 60mg  q6h 2)Continue prednisone 20mg  daily 2) will continue to follow.   Jim Like, DO [facility] [phone]  If 7pm- 7am, please page neurology on call as listed in [place].   If 7pm- 7am, please page neurology on call as listed in [place].

## 2015-04-10 NOTE — Evaluation (Signed)
Physical Therapy Evaluation Patient Details Name: Frances Medina MRN: [id number] DOB: August 02, 1960 Today's Date: 04/10/2015   History of Present Illness  [name] admitted with dyspnea and since diagnosed with myasthenic exacerbation. PMH: bilateral TKA, depression, anxiety, myasthenia gravis.  Clinical Impression  Pt admitted with above diagnosis. Pt currently with functional limitations due to the deficits listed below (see PT Problem List).  Pt will benefit from skilled PT to increase their independence and safety with mobility to allow discharge to the venue listed below. During initial mobilization the patient ambulated 35 feet with rw and supervision on 2L O2. Current mobility is limited by fatigue, SpO2 staying at 95% or better. . Anticipate patient will D/C to home with family assistance but will need to improve her ambulation tolerance and attempt multiple stairs to enter her home.        Follow Up Recommendations Home health PT;Supervision - Intermittent    Equipment Recommendations  None recommended by PT    Recommendations for Other Services OT consult     Precautions / Restrictions Precautions Precautions: Fall;Other (comment) (dyspnea) Restrictions Weight Bearing Restrictions: No      Mobility  Bed Mobility Overal bed mobility: Needs Assistance Bed Mobility:  (seated pivot to edge of bed)           General bed mobility comments: patient found sitting up in bed, patient able to pivot to the edge of bed with supervision  Transfers Overall transfer level: Needs assistance Equipment used: Rolling walker (2 wheeled) Transfers: Sit to/from Stand Sit to Stand: Supervision         General transfer comment: safe technique utilized  Ambulation/Gait Ambulation/Gait assistance: Supervision Ambulation Distance (Feet): 35 Feet Assistive device: Rolling walker (2 wheeled) Gait Pattern/deviations: Step-through pattern;Decreased step length - left;Decreased step length -  right Gait velocity: decreased   General Gait Details: SpO2 staying at 95% or higher during ambulation on 2L O2.   Stairs            Wheelchair Mobility    Modified Rankin (Stroke Patients Only)       Balance Overall balance assessment: Needs assistance Sitting-balance support: No upper extremity supported;Feet supported Sitting balance-Leahy Scale: Good     Standing balance support: Bilateral upper extremity supported Standing balance-Leahy Scale: Poor Standing balance comment: using rw                             Pertinent Vitals/Pain Pain Assessment: Faces Faces Pain Scale: Hurts a little bit Pain Location: headache Pain Descriptors / Indicators: Aching Pain Intervention(s): Monitored during session    Home Living Family/patient expects to be discharged to:: Private residence Living Arrangements: Spouse/significant other;Children Available Help at Discharge: Family;Other (Comment) (alone during the day, help available in evening) Type of Home: House Home Access: Stairs to enter Entrance Stairs-Rails: Left Entrance Stairs-Number of Steps: 3 Home Layout: Two level Home Equipment: [place] - 2 wheels;Cane - single point      Prior Function Level of Independence: Independent with assistive device(s)         Comments: reports using rw prior to admission     Hand Dominance        Extremity/Trunk Assessment               Lower Extremity Assessment: Generalized weakness         Communication   Communication: No difficulties  Cognition Arousal/Alertness: Awake/alert Behavior During Therapy: WFL for tasks assessed/performed Overall  Cognitive Status: Within Functional Limits for tasks assessed                      General Comments      Exercises        Assessment/Plan    PT Assessment Patient needs continued PT services  PT Diagnosis Generalized weakness;Difficulty walking   PT Problem List Decreased  strength;Decreased range of motion;Decreased activity tolerance;Decreased balance;Decreased mobility  PT Treatment Interventions DME instruction;Gait training;Stair training;Functional mobility training;Therapeutic activities;Therapeutic exercise;Patient/family education   PT Goals (Current goals can be found in the Care Plan section) Acute Rehab PT Goals Patient Stated Goal: be independent again PT Goal Formulation: With patient Time For Goal Achievement: 04/24/15 Potential to Achieve Goals: Good    Frequency Min 3X/week   Barriers to discharge        Co-evaluation               End of Session Equipment Utilized During Treatment: Gait belt;Oxygen Activity Tolerance: Patient tolerated treatment well;Patient limited by fatigue Patient left: in chair;with call bell/phone within reach;with chair alarm set Nurse Communication: Mobility status         Time: [id number] PT Time Calculation (min) (ACUTE ONLY): 23 min   Charges:   PT Evaluation $PT Eval Moderate Complexity: 1 Procedure PT Treatments $Gait Training: 8-22 mins   PT G Codes:        Cassell Clement, PT, CSCS Pager [phone] Office [phone]  04/10/2015, 2:26 PM

## 2015-04-10 NOTE — Progress Notes (Signed)
Report given to [facility] receiving nurse. Pt transferred out via wheelchair, awake and conversant. Family visiting updated of floor and room changes.

## 2015-04-10 NOTE — Progress Notes (Signed)
[facility] PROGRESS NOTE  Frances Medina [id number] DOB: 10-03-1960 DOA: 04/07/2015 PCP: No primary care provider on file.  Summary 56 year old female with recently diagnosed myasthenia gravis presents to emergency room after having been placed on Cipro as an outpatient for urinary tract infection. Complained of dyspnea. Workup negative for PE, infiltrate, pulmonary edema. Admitted to step down for myasthenia crisis. Neurology consulted, started steroids, increase Mestinon.  Assessment/Plan:  Principal Problem:   Dyspnea secondary to myasthenia crisis. NIFS improving. Dyspnea improving, still becomes winded with minimal exertion. Will transfer to the floor. Discussed with Dr. Janann Colonel. If dyspnea still a problem tomorrow, he may consider 3 days of IVIG. Will have patient work with PT while here.  Active Problems:   Obesity   E. coli UTI: Sensitivities to most. Continue ceftin to complete 5 days total   Hypokalemia: replating. Recheck tomorrow.    Anemia: Anemia panel normal. stable   Neutropenia ([place]): improving   Lymphadenopathy   Splenomegaly   Chest wall pain:  improved on meloxicam x3 days   Code Status:  full Family Communication:    daughter at bedside  Disposition Plan:  See above  Consultants:   neurology   Procedures:     Antibiotics:   Ceftin   HPI/Subjective: Chest pain resolved. Dyspnea slowly improving but remains even with minimal exertion. Difficulty swallowing has improved and she is now able to eat soft foods.   Objective: Filed Vitals:   04/10/15 0530 04/10/15 0838  BP: 106/52 128/88  Pulse: 110 71  Temp:  98 F (36.7 C)  Resp: 25 21    Intake/Output Summary (Last 24 hours) at 04/10/15 1047 Last data filed at 04/09/15 2218  Gross per 24 hour  Intake    363 ml  Output      0 ml  Net    363 ml   Filed Weights   04/08/15 0412 04/09/15 0500 04/10/15 0500  Weight: 123.333 kg (271 lb 14.4 oz) 122.562 kg (270 lb 3.2 oz) 122.426 kg (269  lb 14.4 oz)    Exam:   General:   comfortable at rest. able to speak in full sentences now. Breathing nonlabored.   Cardiovascular:  regular rate rhythm without murmurs gallops rubs.    Respiratory:  clear to auscultation bilaterally without wheezes rhonchi or rales   Abdomen:  obese, soft nontender nondistended   Ext:  no pitting edema.  Basic Metabolic Panel:  Recent Labs Lab 04/05/15 1113 04/07/15 1210 04/07/15 2031 04/09/15 0659  NA 135 137  --  140  K 3.8 3.3*  --  3.4*  CL 101 100*  --  104  CO2 23 27  --  28  GLUCOSE 99 97  --  86  BUN 9 9  --  9  CREATININE 0.70 0.77 0.69 0.58  CALCIUM 8.5* 8.7*  --  8.2*   Liver Function Tests:  Recent Labs Lab 04/05/15 1113 04/09/15 0659  AST 59* 54*  ALT 46 35  ALKPHOS 64 54  BILITOT 1.0 0.9  PROT 7.9 7.0  ALBUMIN 3.2* 2.9*   No results for input(s): LIPASE, AMYLASE in the last 168 hours. No results for input(s): AMMONIA in the last 168 hours. CBC:  Recent Labs Lab 04/05/15 1113 04/07/15 1210 04/07/15 2031 04/08/15 0207 04/09/15 0659  WBC 3.8* 2.7* 1.9* 1.9* 3.7*  NEUTROABS 2.9 2.0  --  1.2* 1.8  HGB 9.6* 9.5* 8.8* 8.8* 8.6*  HCT 30.0* 29.5* 27.5* 27.4* 26.9*  MCV 75.8* 75.8* 76.2* 76.8*  77.5*  PLT 243 218 213 231 235   Cardiac Enzymes:  Recent Labs Lab 04/08/15 0555 04/08/15 1053 04/08/15 1657  TROPONINI <0.03 <0.03 <0.03   BNP (last 3 results)  Recent Labs  04/07/15 1210  BNP 8.6    ProBNP (last 3 results) No results for input(s): PROBNP in the last 8760 hours.  CBG:  Recent Labs Lab 04/09/15 1339  GLUCAP 94    Recent Results (from the past 240 hour(s))  Urine culture     Status: None   Collection Time: 04/05/15 11:37 AM  Result Value Ref Range Status   Specimen Description URINE, CLEAN CATCH  Final   Special Requests NONE  Final   Culture   Final    >=100,000 COLONIES/mL ESCHERICHIA COLI 80,000 COLONIES/ml ESCHERICHIA COLI    Report Status 04/08/2015 FINAL  Final    Organism ID, Bacteria ESCHERICHIA COLI  Final   Organism ID, Bacteria ESCHERICHIA COLI  Final      Susceptibility   Escherichia coli - MIC*    AMPICILLIN >=32 RESISTANT Resistant     CEFAZOLIN <=4 SENSITIVE Sensitive     CEFTRIAXONE <=1 SENSITIVE Sensitive     CIPROFLOXACIN <=0.25 SENSITIVE Sensitive     GENTAMICIN >=16 RESISTANT Resistant     IMIPENEM <=0.25 SENSITIVE Sensitive     NITROFURANTOIN <=16 SENSITIVE Sensitive     TRIMETH/SULFA <=20 SENSITIVE Sensitive     AMPICILLIN/SULBACTAM 16 INTERMEDIATE Intermediate     PIP/TAZO <=4 SENSITIVE Sensitive    Escherichia coli - MIC*    AMPICILLIN 4 SENSITIVE Sensitive     CEFAZOLIN <=4 SENSITIVE Sensitive     CEFTRIAXONE <=1 SENSITIVE Sensitive     CIPROFLOXACIN >=4 RESISTANT Resistant     GENTAMICIN <=1 SENSITIVE Sensitive     IMIPENEM <=0.25 SENSITIVE Sensitive     NITROFURANTOIN <=16 SENSITIVE Sensitive     TRIMETH/SULFA <=20 SENSITIVE Sensitive     AMPICILLIN/SULBACTAM <=2 SENSITIVE Sensitive     PIP/TAZO <=4 SENSITIVE Sensitive     * >=100,000 COLONIES/mL ESCHERICHIA COLI    80,000 COLONIES/ml ESCHERICHIA COLI  Urine culture     Status: None   Collection Time: 04/07/15  1:49 PM  Result Value Ref Range Status   Specimen Description URINE, CLEAN CATCH  Final   Special Requests NONE  Final   Culture >=100,000 COLONIES/mL ESCHERICHIA COLI  Final   Report Status 04/09/2015 FINAL  Final   Organism ID, Bacteria ESCHERICHIA COLI  Final      Susceptibility   Escherichia coli - MIC*    AMPICILLIN 8 SENSITIVE Sensitive     CEFAZOLIN <=4 SENSITIVE Sensitive     CEFTRIAXONE <=1 SENSITIVE Sensitive     CIPROFLOXACIN >=4 RESISTANT Resistant     GENTAMICIN 4 SENSITIVE Sensitive     IMIPENEM 0.5 SENSITIVE Sensitive     NITROFURANTOIN <=16 SENSITIVE Sensitive     TRIMETH/SULFA <=20 SENSITIVE Sensitive     AMPICILLIN/SULBACTAM 4 SENSITIVE Sensitive     PIP/TAZO <=4 SENSITIVE Sensitive     * >=100,000 COLONIES/mL ESCHERICHIA COLI  MRSA  PCR Screening     Status: None   Collection Time: 04/07/15  8:34 PM  Result Value Ref Range Status   MRSA by PCR NEGATIVE NEGATIVE Final    Comment:        The GeneXpert MRSA Assay (FDA approved for NASAL specimens only), is one component of a comprehensive MRSA colonization surveillance program. It is not intended to diagnose MRSA infection nor to guide or monitor treatment for  MRSA infections.      Studies: No results found.  Scheduled Meds: . antiseptic oral rinse  7 mL Mouth Rinse BID  . cefUROXime  500 mg Oral BID WC  . enoxaparin (LOVENOX) injection  40 mg Subcutaneous Q24H  . estradiol  0.5 mg Oral Daily  . feeding supplement (ENSURE ENLIVE)  237 mL Oral BID BM  . meloxicam  15 mg Oral Q breakfast  . [START ON 04/11/2015] predniSONE  20 mg Oral Q breakfast  . pyridostigmine  60 mg Oral 4 times per day  . senna-docusate  2 tablet Oral QHS  . sertraline  50 mg Oral q morning - 10a  . sodium chloride flush  3 mL Intravenous Q12H  . sodium chloride flush  3 mL Intravenous Q12H   Continuous Infusions:   Time spent: 35 minutes  [facility] [email or web address], password [facility] 04/10/2015, 10:47 AM  LOS: 3 days

## 2015-04-10 NOTE — Progress Notes (Signed)
VC 6.6L and -60 NIF. Patient performed with good effort.

## 2015-04-11 DIAGNOSIS — G7000 Myasthenia gravis without (acute) exacerbation: Secondary | ICD-10-CM

## 2015-04-11 LAB — CBC WITH DIFFERENTIAL/PLATELET
Basophils Absolute: 0 10*3/uL (ref 0.0–0.1)
Basophils Relative: 0 %
Eosinophils Absolute: 0 10*3/uL (ref 0.0–0.7)
Eosinophils Relative: 0 %
HCT: 28.9 % — ABNORMAL LOW (ref 36.0–46.0)
Hemoglobin: 8.6 g/dL — ABNORMAL LOW (ref 12.0–15.0)
Lymphocytes Relative: 26 %
Lymphs Abs: 1.1 10*3/uL (ref 0.7–4.0)
MCH: 23.6 pg — ABNORMAL LOW (ref 26.0–34.0)
MCHC: 29.8 g/dL — ABNORMAL LOW (ref 30.0–36.0)
MCV: 79.2 fL (ref 78.0–100.0)
Monocytes Absolute: 0.2 10*3/uL (ref 0.1–1.0)
Monocytes Relative: 5 %
Neutro Abs: 3 10*3/uL (ref 1.7–7.7)
Neutrophils Relative %: 69 %
Platelets: 299 10*3/uL (ref 150–400)
RBC: 3.65 MIL/uL — ABNORMAL LOW (ref 3.87–5.11)
RDW: 21.7 % — ABNORMAL HIGH (ref 11.5–15.5)
WBC: 4.3 10*3/uL (ref 4.0–10.5)

## 2015-04-11 LAB — BASIC METABOLIC PANEL
Anion gap: 7 (ref 5–15)
BUN: 6 mg/dL (ref 6–20)
CO2: 32 mmol/L (ref 22–32)
Calcium: 8.6 mg/dL — ABNORMAL LOW (ref 8.9–10.3)
Chloride: 101 mmol/L (ref 101–111)
Creatinine, Ser: 0.57 mg/dL (ref 0.44–1.00)
GFR calc Af Amer: >60 mL/min (ref >60)
GFR calc non Af Amer: >60 mL/min (ref >60)
Glucose, Bld: 100 mg/dL — ABNORMAL HIGH (ref 65–99)
Potassium: 3.4 mmol/L — ABNORMAL LOW (ref 3.5–5.1)
Sodium: 140 mmol/L (ref 135–145)

## 2015-04-11 MED ORDER — POTASSIUM CHLORIDE CRYS ER 20 MEQ PO TBCR
40.0000 meq | EXTENDED_RELEASE_TABLET | ORAL | Status: AC
  Administered 2015-04-11 (×2): 40 meq via ORAL
  Filled 2015-04-11: qty 2

## 2015-04-11 MED ORDER — IMMUNE GLOBULIN (HUMAN) 20 GM/200ML IV SOLN
400.0000 mg/kg | INTRAVENOUS | Status: AC
  Administered 2015-04-11 – 2015-04-13 (×3): 50 g via INTRAVENOUS
  Filled 2015-04-11 (×4): qty 100

## 2015-04-11 NOTE — Progress Notes (Addendum)
Patient Demographics:    Frances Medina, is a 55 y.o. female, DOB - 07-Apr-1960, [id number]  Admit date - 04/07/2015   Admitting Physician Delfina Redwood, MD  Outpatient Primary MD for the patient is No primary care provider on file.  LOS - 4   Chief Complaint  Patient presents with  . Shortness of Breath  . Palpitations  . Fever        Subjective:    Frances Medina today has, No headache, No chest pain, No abdominal pain - No Nausea, No new weakness tingling or numbness, No Cough - SOB.     Assessment  & Plan :     1. Myesthenia crisis. Neurology following. And BE on oral steroid however has been started on IVIG on 04/11/2015, continue pyridostigmine, no resting shortness of breath, resting if is -60 continue to monitor under the guidance of neurology.  2. Nonspecific splenomegaly and thoracic lymphadenopathy noted on CT angiogram chest. Outpatient follow-up with hematology.  3. Hypokalemia. Replaced and monitor.  4. Recent left it ptosis and dysphagia. Much improved with myasthenia treatment. Had extensive dysphagia treatment recent admission.  5. Anemia of chronic disease. Supportive care and no need for transfusion.  6. Morbid obesity. Outpatient follow-up with PCP.   Code Status : Full  Family Communication  : None present  Disposition Plan  : Stay inpatient  Consults  :  Neuro  Procedures  :  CT angiogram chest with nonspecific lymphadenopathy.  TTE - Technically difficult study. Definity administered. LVEF 60-65%,normal wall motion/thickness, diastolic dysfunction, indeterminate LV filling pressure, moderate LAE, mild TR, top normal RVSP.   DVT Prophylaxis  :  Lovenox    Lab Results  Component Value Date   PLT 299 04/11/2015    Inpatient Medications  Scheduled  Meds: . antiseptic oral rinse  7 mL Mouth Rinse BID  . enoxaparin (LOVENOX) injection  40 mg Subcutaneous Q24H  . estradiol  0.5 mg Oral Daily  . feeding supplement (ENSURE ENLIVE)  237 mL Oral BID BM  . Immune Globulin 10%  400 mg/kg Intravenous Q24 Hr x 5  . meloxicam  15 mg Oral Q breakfast  . predniSONE  20 mg Oral Q breakfast  . pyridostigmine  60 mg Oral 4 times per day  . senna-docusate  2 tablet Oral QHS  . sertraline  50 mg Oral q morning - 10a  . sodium chloride flush  3 mL Intravenous Q12H  . sodium chloride flush  3 mL Intravenous Q12H   Continuous Infusions:  PRN Meds:.sodium chloride, acetaminophen **OR** acetaminophen, albuterol, alum & mag hydroxide-simeth, bisacodyl, clonazePAM, ibuprofen, ondansetron **OR** ondansetron (ZOFRAN) IV, sodium chloride flush, sodium phosphate  Antibiotics  :    Anti-infectives    Start     Dose/Rate Route Frequency Ordered Stop   04/08/15 2200  cefUROXime (CEFTIN) tablet 500 mg     500 mg Oral 2 times daily with meals 04/07/15 1946 04/11/15 0834        Objective:   Filed Vitals:   04/10/15 1854 04/11/15 0258 04/11/15 0520 04/11/15 1126  BP: 105/52 94/44 94/48  113/64  Pulse: 77 88 88 78  Temp: 98.3 F (36.8 C) 98.6 F (37 C) 98.4 F (36.9 C) 97.5 F (36.4 C)  TempSrc: Oral Oral Oral Oral  Resp: 18 18 20 18   Height:      Weight:      SpO2: 100% 100% 98% 100%    Wt Readings from Last 3 Encounters:  04/10/15 122.426 kg (269 lb 14.4 oz)  04/05/15 127.461 kg (281 lb)  03/19/15 127.461 kg (281 lb)    No intake or output data in the 24 hours ending 04/11/15 1150   Physical Exam  Awake Alert, Oriented X 3, No new F.N deficits, Normal affect Rufus.AT,PERRAL, mild L ptosis Supple Neck,No JVD, No cervical lymphadenopathy appriciated.  Symmetrical Chest wall movement, Good air movement bilaterally, CTAB RRR,No Gallops,Rubs or new Murmurs, No Parasternal Heave +ve B.Sounds, Abd Soft, No tenderness, No organomegaly appriciated,  No rebound - guarding or rigidity. No Cyanosis, Clubbing or edema, No new Rash or bruise       Data Review:   Micro Results Recent Results (from the past 240 hour(s))  Urine culture     Status: None   Collection Time: 04/05/15 11:37 AM  Result Value Ref Range Status   Specimen Description URINE, CLEAN CATCH  Final   Special Requests NONE  Final   Culture   Final    >=100,000 COLONIES/mL ESCHERICHIA COLI 80,000 COLONIES/ml ESCHERICHIA COLI    Report Status 04/08/2015 FINAL  Final   Organism ID, Bacteria ESCHERICHIA COLI  Final   Organism ID, Bacteria ESCHERICHIA COLI  Final      Susceptibility   Escherichia coli - MIC*    AMPICILLIN >=32 RESISTANT Resistant     CEFAZOLIN <=4 SENSITIVE Sensitive     CEFTRIAXONE <=1 SENSITIVE Sensitive     CIPROFLOXACIN <=0.25 SENSITIVE Sensitive     GENTAMICIN >=16 RESISTANT Resistant     IMIPENEM <=0.25 SENSITIVE Sensitive     NITROFURANTOIN <=16 SENSITIVE Sensitive     TRIMETH/SULFA <=20 SENSITIVE Sensitive     AMPICILLIN/SULBACTAM 16 INTERMEDIATE Intermediate     PIP/TAZO <=4 SENSITIVE Sensitive    Escherichia coli - MIC*    AMPICILLIN 4 SENSITIVE Sensitive     CEFAZOLIN <=4 SENSITIVE Sensitive     CEFTRIAXONE <=1 SENSITIVE Sensitive     CIPROFLOXACIN >=4 RESISTANT Resistant     GENTAMICIN <=1 SENSITIVE Sensitive     IMIPENEM <=0.25 SENSITIVE Sensitive     NITROFURANTOIN <=16 SENSITIVE Sensitive     TRIMETH/SULFA <=20 SENSITIVE Sensitive     AMPICILLIN/SULBACTAM <=2 SENSITIVE Sensitive     PIP/TAZO <=4 SENSITIVE Sensitive     * >=100,000 COLONIES/mL ESCHERICHIA COLI    80,000 COLONIES/ml ESCHERICHIA COLI  Urine culture     Status: None   Collection Time: 04/07/15  1:49 PM  Result Value Ref Range Status   Specimen Description URINE, CLEAN CATCH  Final   Special Requests NONE  Final   Culture >=100,000 COLONIES/mL ESCHERICHIA COLI  Final   Report Status 04/09/2015 FINAL  Final   Organism ID, Bacteria ESCHERICHIA COLI  Final       Susceptibility   Escherichia coli - MIC*    AMPICILLIN 8 SENSITIVE Sensitive     CEFAZOLIN <=4 SENSITIVE Sensitive     CEFTRIAXONE <=1 SENSITIVE Sensitive     CIPROFLOXACIN >=4 RESISTANT Resistant     GENTAMICIN 4 SENSITIVE Sensitive     IMIPENEM 0.5 SENSITIVE Sensitive     NITROFURANTOIN <=16 SENSITIVE Sensitive     TRIMETH/SULFA <=20 SENSITIVE Sensitive     AMPICILLIN/SULBACTAM 4 SENSITIVE Sensitive     PIP/TAZO <=4 SENSITIVE Sensitive     * >=  100,000 COLONIES/mL ESCHERICHIA COLI  MRSA PCR Screening     Status: None   Collection Time: 04/07/15  8:34 PM  Result Value Ref Range Status   MRSA by PCR NEGATIVE NEGATIVE Final    Comment:        The GeneXpert MRSA Assay (FDA approved for NASAL specimens only), is one component of a comprehensive MRSA colonization surveillance program. It is not intended to diagnose MRSA infection nor to guide or monitor treatment for MRSA infections.     Radiology Reports Dg Chest 2 View  04/07/2015  CLINICAL DATA:  Pt c/o left-sided chest pain, SOB, cough, and fever x 3 days. Hx of HTN and GERD. Pt is a former smoker. EXAM: CHEST - 2 VIEW COMPARISON:  04/05/2015 FINDINGS: Low lung volumes. Linear subsegmental atelectasis in the lung bases as before. No overt edema. Heart size normal. No pneumothorax. No effusion. Spurring in the mid thoracic spine. IMPRESSION: Low volumes with bibasilar atelectasis.  No acute disease. Electronically Signed   By: Lucrezia Europe M.D.   On: 04/07/2015 12:01   Dg Chest 2 View  04/05/2015  CLINICAL DATA:  55 year old female with dizziness for several days, increasing. Cough and weakness. Initial encounter. EXAM: CHEST  2 VIEW COMPARISON:  Chest CT 03/22/2015 and earlier. FINDINGS: Large body habitus. Low lung volumes. Bibasilar atelectasis. Stable cardiac size and mediastinal contours. Visualized tracheal air column is within normal limits. No pneumothorax or pulmonary edema. No pleural effusion. No acute osseous abnormality  identified. IMPRESSION: Very low lung volumes with bibasilar atelectasis. Electronically Signed   By: Genevie Ann M.D.   On: 04/05/2015 11:35        Ct Angio Chest Pe W/cm &/or Wo Cm  04/07/2015  CLINICAL DATA:  SHOB AND SOME LEFT SIDED CHEST PAIN SINCE THIS AM 0500 RASH TO BACK AND ARM WITH RECENT ANTIBIOTIC STARTED Thursday PMH: HTN, GERD (gastroesophageal reflux disease); Tumors; Knee injury; Depression EXAM: CT ANGIOGRAPHY CHEST WITH CONTRAST TECHNIQUE: Multidetector CT imaging of the chest was performed using the standard protocol during bolus administration of intravenous contrast. Multiplanar CT image reconstructions and MIPs were obtained to evaluate the vascular anatomy. CONTRAST:  55mL OMNIPAQUE IOHEXOL 350 MG/ML SOLN COMPARISON:  03/22/2015 FINDINGS: Right arm IV contrast injection. The SVC is patent. Right atrium nondilated. RV is nondilated. Satisfactory opacification of pulmonary arteries noted, and there is no evidence of pulmonary emboli. Patent pulmonary veins. Adequate contrast opacification of the thoracic aorta with no evidence of dissection, aneurysm, or stenosis. There is classic 3-vessel brachiocephalic arch anatomy without proximal stenosis. No pleural or pericardial effusion. Prominent axillary, left cervical level 4 and supraclavicular lymph nodes, new since prior study. Subsegmental atelectasis in basilar segments of both lower lobes. Flowing osteophytes across several contiguous levels in the lower thoracic spine. Sternum intact. Borderline splenomegaly suspected, new. Remainder visualized portions of upper abdomen unremarkable. Review of the MIP images confirms the above findings. IMPRESSION: 1. Negative for acute PE or thoracic aortic dissection. 2. Progressive splenomegaly as well as prominence of bilateral axillary, left subclavian and cervical lymph nodes, possibly reactive but nonspecific. Follow-up suggested to exclude neoplasm or lymphoproliferative process. Electronically Signed    By: Lucrezia Europe M.D.   On: 04/07/2015 14:39     CBC  Recent Labs Lab 04/05/15 1113 04/07/15 1210 04/07/15 2031 04/08/15 0207 04/09/15 0659 04/11/15 0516  WBC 3.8* 2.7* 1.9* 1.9* 3.7* 4.3  HGB 9.6* 9.5* 8.8* 8.8* 8.6* 8.6*  HCT 30.0* 29.5* 27.5* 27.4* 26.9* 28.9*  PLT 243  218 213 231 235 299  MCV 75.8* 75.8* 76.2* 76.8* 77.5* 79.2  MCH 24.2* 24.4* 24.4* 24.6* 24.8* 23.6*  MCHC 32.0 32.2 32.0 32.1 32.0 29.8*  RDW 20.4* 21.0* 21.0* 21.5* 21.9* 21.7*  LYMPHSABS 0.7 0.6*  --  0.6* 1.6 1.1  MONOABS 0.3 0.1  --  0.1 0.3 0.2  EOSABS 0.0 0.0  --  0.0 0.0 0.0  BASOSABS 0.0 0.0  --  0.0 0.0 0.0    Chemistries   Recent Labs Lab 04/05/15 1113 04/07/15 1210 04/07/15 2031 04/09/15 0659 04/11/15 0516  NA 135 137  --  140 140  K 3.8 3.3*  --  3.4* 3.4*  CL 101 100*  --  104 101  CO2 23 27  --  28 32  GLUCOSE 99 97  --  86 100*  BUN 9 9  --  9 6  CREATININE 0.70 0.77 0.69 0.58 0.57  CALCIUM 8.5* 8.7*  --  8.2* 8.6*  AST 59*  --   --  54*  --   ALT 46  --   --  35  --   ALKPHOS 64  --   --  54  --   BILITOT 1.0  --   --  0.9  --    ------------------------------------------------------------------------------------------------------------------ No results for input(s): CHOL, HDL, LDLCALC, TRIG, CHOLHDL, LDLDIRECT in the last 72 hours.  Lab Results  Component Value Date   HGBA1C 6.1 07/21/2013   ------------------------------------------------------------------------------------------------------------------ No results for input(s): TSH, T4TOTAL, T3FREE, THYROIDAB in the last 72 hours.  Invalid input(s): FREET3 ------------------------------------------------------------------------------------------------------------------ No results for input(s): VITAMINB12, FOLATE, FERRITIN, TIBC, IRON, RETICCTPCT in the last 72 hours.  Coagulation profile No results for input(s): INR, PROTIME in the last 168 hours.  No results for input(s): DDIMER in the last 72 hours.  Cardiac  Enzymes  Recent Labs Lab 04/08/15 0555 04/08/15 1053 04/08/15 1657  TROPONINI <0.03 <0.03 <0.03   ------------------------------------------------------------------------------------------------------------------    Component Value Date/Time   BNP 8.6 04/07/2015 1210    Time Spent in minutes  35   SINGH,PRASHANT K M.D on 04/11/2015 at 11:50 AM  Between 7am to 7pm - Pager - [phone]  After 7pm go to [email or web address] - password [facility]  [facility] -  Office  [phone]

## 2015-04-11 NOTE — Progress Notes (Signed)
Patient had a NIF greater than -60 and VC 1.5 L with good effort.

## 2015-04-11 NOTE — Progress Notes (Signed)
Subjective: Feels improved but continues to feel short of breath.   NIF -60, VC 6.6  Exam: Filed Vitals:   04/11/15 0258 04/11/15 0520  BP: 94/44 94/48  Pulse: 88 88  Temp: 98.6 F (37 C) 98.4 F (36.9 C)  Resp: 18 20   Gen: In bed, NAD Resp: non-labored breathing, no acute distress Abd: soft, nt  Neuro: MS: awake, alert.  CN:L ptosis, EOMI  Pertinent Labs: As above   Impression:  [name] with myasthenic exacerbation 2/2 cipro, observing off cipro.was hoping to avoid IVIG but continues to feel SOB   Recommendations: 1)continue mestinon to 60mg  q6h 2)Continue prednisone 20mg  daily 3) will try 3 days of IVIG. [name]   Jim Like, DO [facility] [phone]  If 7pm- 7am, please page neurology on call as listed in [place].   If 7pm- 7am, please page neurology on call as listed in [place].

## 2015-04-11 NOTE — Progress Notes (Signed)
Pt had a VC of 1.15L and a NIF of -48. RT took best of 3 attempts.

## 2015-04-11 NOTE — Progress Notes (Signed)
Physical Therapy Treatment Patient Details Name: Frances Medina MRN: [id number] DOB: January 12, 1961 Today's Date: 04/11/2015    History of Present Illness [name] admitted with dyspnea and since diagnosed with myasthenic exacerbation. PMH: bilateral TKA, depression, anxiety, myasthenia gravis.    PT Comments    Pt progressing towards all goals. Tolerated increased ambulation distance today and stair training. Pt needing supervision for ambulation with AD and Min G for steps and hallway walking w/o AD. Pt demonstrates proper safety awareness and self-monitoring of O2 status. Pt remained 91% or higher throughout session on RA. Will continue to follow pt acutely before transition to HHPT and intermittent supervision.    Follow Up Recommendations  Home health PT;Supervision - Intermittent     Equipment Recommendations  None recommended by PT    Recommendations for Other Services       Precautions / Restrictions Precautions Precautions: Fall;Other (comment) (dyspnea) Restrictions Weight Bearing Restrictions: No    Mobility  Bed Mobility Overal bed mobility: Modified Independent Bed Mobility: Supine to Sit     Supine to sit: Modified independent (Device/Increase time);HOB elevated     General bed mobility comments: pt pivoted to sitting EOB, O2 dropped to 88% on 2L SpO2  Transfers Overall transfer level: Needs assistance Equipment used: Rolling walker (2 wheeled) Transfers: Sit to/from Stand Sit to Stand: Supervision         General transfer comment: safe technique utilized, stood fully before grabbing RW  Ambulation/Gait Ambulation/Gait assistance: Supervision;Min assist Ambulation Distance (Feet): 200 Feet Assistive device: Rolling walker (2 wheeled);None Gait Pattern/deviations: Step-through pattern;Decreased stride length;Wide base of support Gait velocity: decreased Gait velocity interpretation: Below normal speed for age/gender General Gait Details: SpO2 staying  at 97% on 2L O2, dropped to 91% on RA, ambulated w/ supervision with RW, Needing Min A HHA for ambyualting without AD   Stairs Stairs: Yes Stairs assistance: Min guard Stair Management: Two rails;Step to pattern Number of Stairs: 5 General stair comments: use of both rails as pt stated she also has a wall at home, safe technique  Wheelchair Mobility    Modified Rankin (Stroke Patients Only)       Balance Overall balance assessment: Needs assistance Sitting-balance support: No upper extremity supported;Feet supported Sitting balance-Leahy Scale: Good     Standing balance support: Bilateral upper extremity supported;No upper extremity supported;During functional activity Standing balance-Leahy Scale: Fair                      Cognition Arousal/Alertness: Awake/alert Behavior During Therapy: WFL for tasks assessed/performed Overall Cognitive Status: Within Functional Limits for tasks assessed                      Exercises      General Comments        Pertinent Vitals/Pain Pain Assessment: No/denies pain  SATURATION QUALIFICATIONS: Patient Saturations on Room Air at Rest = 94%  Patient Saturations on Room Air while Ambulating = 91%  Patient Saturations on 2 Liters of oxygen while Ambulating = 100%      Home Living                      Prior Function            PT Goals (current goals can now be found in the care plan section) Acute Rehab PT Goals Patient Stated Goal: back to normal self Progress towards PT goals: Progressing toward goals    Frequency  Min 3X/week    PT Plan Current plan remains appropriate    Co-evaluation             End of Session Equipment Utilized During Treatment: Gait belt;Oxygen Activity Tolerance: Patient tolerated treatment well Patient left: in chair;with call bell/phone within reach     Time: 0804-0824 PT Time Calculation (min) (ACUTE ONLY): 20 min  Charges:  $Gait Training: 8-22  mins                    G Codes:      Frances Medina 2015/04/30, 10:24 AM  Frances Medina, Student Physical Therapist Acute Rehab [phone]

## 2015-04-12 LAB — BASIC METABOLIC PANEL
Anion gap: 10 (ref 5–15)
BUN: 5 mg/dL — ABNORMAL LOW (ref 6–20)
CO2: 27 mmol/L (ref 22–32)
Calcium: 8.7 mg/dL — ABNORMAL LOW (ref 8.9–10.3)
Chloride: 103 mmol/L (ref 101–111)
Creatinine, Ser: 0.55 mg/dL (ref 0.44–1.00)
GFR calc Af Amer: >60 mL/min (ref >60)
GFR calc non Af Amer: >60 mL/min (ref >60)
Glucose, Bld: 98 mg/dL (ref 65–99)
Potassium: 3.5 mmol/L (ref 3.5–5.1)
Sodium: 140 mmol/L (ref 135–145)

## 2015-04-12 LAB — MAGNESIUM: Magnesium: 2.1 mg/dL (ref 1.7–2.4)

## 2015-04-12 NOTE — Progress Notes (Signed)
Pt achieved VC of 1.0L and NIF of >-60 with excellent effort.

## 2015-04-12 NOTE — Progress Notes (Signed)
Patient achieved a VC of .950L and a NIF of >-60cm H20 with excellent effort.

## 2015-04-12 NOTE — Progress Notes (Signed)
Patient Demographics:    Coralene Todd, is a 55 y.o. female, DOB - 01-20-1961, [id number]  Admit date - 04/07/2015   Admitting Physician Delfina Redwood, MD  Outpatient Primary MD for the patient is No primary care provider on file.  LOS - 5   Chief Complaint  Patient presents with  . Shortness of Breath  . Palpitations  . Fever        Subjective:    Jacquelene Wuethrich today has, No headache, No chest pain, No abdominal pain - No Nausea, No new weakness tingling or numbness, No Cough - SOB.     Assessment  & Plan :     1. Myesthenia crisis. Neurology following. And BE on oral steroid however has been started on IVIG on 04/11/2015, continue pyridostigmine, no resting shortness of breath, resting if is more than -ve 60 continue to monitor under the guidance of neurology.  2. Nonspecific splenomegaly and thoracic lymphadenopathy noted on CT angiogram chest. Outpatient follow-up with hematology/oncology.  3. Hypokalemia. Replaced and monitor.  4. Recent left it ptosis and dysphagia. Much improved with myasthenia treatment. Had extensive dysphagia treatment recent admission.  5. Anemia of chronic disease. Supportive care and no need for transfusion.  6. Morbid obesity. Outpatient follow-up with PCP.   Code Status : Full  Family Communication  : None present  Disposition Plan  : Stay inpatient  Consults  :  Neuro  Procedures  :  CT angiogram chest with nonspecific lymphadenopathy.  TTE - Technically difficult study. Definity administered. LVEF 60-65%,normal wall motion/thickness, diastolic dysfunction, indeterminate LV filling pressure, moderate LAE, mild TR, top normal RVSP.   DVT Prophylaxis  :  Lovenox    Lab Results  Component Value Date   PLT 299 04/11/2015    Inpatient  Medications  Scheduled Meds: . antiseptic oral rinse  7 mL Mouth Rinse BID  . enoxaparin (LOVENOX) injection  40 mg Subcutaneous Q24H  . estradiol  0.5 mg Oral Daily  . feeding supplement (ENSURE ENLIVE)  237 mL Oral BID BM  . Immune Globulin 10%  400 mg/kg Intravenous Q24 Hr x 5  . predniSONE  20 mg Oral Q breakfast  . pyridostigmine  60 mg Oral 4 times per day  . senna-docusate  2 tablet Oral QHS  . sertraline  50 mg Oral q morning - 10a  . sodium chloride flush  3 mL Intravenous Q12H  . sodium chloride flush  3 mL Intravenous Q12H   Continuous Infusions:  PRN Meds:.sodium chloride, acetaminophen **OR** acetaminophen, albuterol, alum & mag hydroxide-simeth, bisacodyl, clonazePAM, ibuprofen, ondansetron **OR** ondansetron (ZOFRAN) IV, sodium chloride flush, sodium phosphate  Antibiotics  :    Anti-infectives    Start     Dose/Rate Route Frequency Ordered Stop   04/08/15 2200  cefUROXime (CEFTIN) tablet 500 mg     500 mg Oral 2 times daily with meals 04/07/15 1946 04/11/15 0834        Objective:   Filed Vitals:   04/12/15 0117 04/12/15 0500 04/12/15 0515 04/12/15 0905  BP: 134/70  98/56 128/94  Pulse: 77  74 105  Temp: 97.8 F (36.6 C)  97.8 F (36.6 C) 97.5 F (36.4 C)  TempSrc: Oral  Oral Oral  Resp: 18  18 20  Height:      Weight:  120.4 kg (265 lb 6.9 oz)    SpO2:   100% 100%    Wt Readings from Last 3 Encounters:  04/12/15 120.4 kg (265 lb 6.9 oz)  04/05/15 127.461 kg (281 lb)  03/19/15 127.461 kg (281 lb)     Intake/Output Summary (Last 24 hours) at 04/12/15 1120 Last data filed at 04/12/15 0907  Gross per 24 hour  Intake    480 ml  Output      0 ml  Net    480 ml     Physical Exam  Awake Alert, Oriented X 3, No new F.N deficits, Normal affect [place].AT,PERRAL, mild L ptosis Supple Neck,No JVD, No cervical lymphadenopathy appriciated.  Symmetrical Chest wall movement, Good air movement bilaterally, CTAB RRR,No Gallops,Rubs or new Murmurs, No  Parasternal Heave +ve B.Sounds, Abd Soft, No tenderness, No organomegaly appriciated, No rebound - guarding or rigidity. No Cyanosis, Clubbing or edema, No new Rash or bruise       Data Review:   Micro Results Recent Results (from the past 240 hour(s))  Urine culture     Status: None   Collection Time: 04/05/15 11:37 AM  Result Value Ref Range Status   Specimen Description URINE, CLEAN CATCH  Final   Special Requests NONE  Final   Culture   Final    >=100,000 COLONIES/mL ESCHERICHIA COLI 80,000 COLONIES/ml ESCHERICHIA COLI    Report Status 04/08/2015 FINAL  Final   Organism ID, Bacteria ESCHERICHIA COLI  Final   Organism ID, Bacteria ESCHERICHIA COLI  Final      Susceptibility   Escherichia coli - MIC*    AMPICILLIN >=32 RESISTANT Resistant     CEFAZOLIN <=4 SENSITIVE Sensitive     CEFTRIAXONE <=1 SENSITIVE Sensitive     CIPROFLOXACIN <=0.25 SENSITIVE Sensitive     GENTAMICIN >=16 RESISTANT Resistant     IMIPENEM <=0.25 SENSITIVE Sensitive     NITROFURANTOIN <=16 SENSITIVE Sensitive     TRIMETH/SULFA <=20 SENSITIVE Sensitive     AMPICILLIN/SULBACTAM 16 INTERMEDIATE Intermediate     PIP/TAZO <=4 SENSITIVE Sensitive    Escherichia coli - MIC*    AMPICILLIN 4 SENSITIVE Sensitive     CEFAZOLIN <=4 SENSITIVE Sensitive     CEFTRIAXONE <=1 SENSITIVE Sensitive     CIPROFLOXACIN >=4 RESISTANT Resistant     GENTAMICIN <=1 SENSITIVE Sensitive     IMIPENEM <=0.25 SENSITIVE Sensitive     NITROFURANTOIN <=16 SENSITIVE Sensitive     TRIMETH/SULFA <=20 SENSITIVE Sensitive     AMPICILLIN/SULBACTAM <=2 SENSITIVE Sensitive     PIP/TAZO <=4 SENSITIVE Sensitive     * >=100,000 COLONIES/mL ESCHERICHIA COLI    80,000 COLONIES/ml ESCHERICHIA COLI  Urine culture     Status: None   Collection Time: 04/07/15  1:49 PM  Result Value Ref Range Status   Specimen Description URINE, CLEAN CATCH  Final   Special Requests NONE  Final   Culture >=100,000 COLONIES/mL ESCHERICHIA COLI  Final   Report  Status 04/09/2015 FINAL  Final   Organism ID, Bacteria ESCHERICHIA COLI  Final      Susceptibility   Escherichia coli - MIC*    AMPICILLIN 8 SENSITIVE Sensitive     CEFAZOLIN <=4 SENSITIVE Sensitive     CEFTRIAXONE <=1 SENSITIVE Sensitive     CIPROFLOXACIN >=4 RESISTANT Resistant     GENTAMICIN 4 SENSITIVE Sensitive     IMIPENEM 0.5 SENSITIVE Sensitive     NITROFURANTOIN <=16 SENSITIVE Sensitive  TRIMETH/SULFA <=20 SENSITIVE Sensitive     AMPICILLIN/SULBACTAM 4 SENSITIVE Sensitive     PIP/TAZO <=4 SENSITIVE Sensitive     * >=100,000 COLONIES/mL ESCHERICHIA COLI  MRSA PCR Screening     Status: None   Collection Time: 04/07/15  8:34 PM  Result Value Ref Range Status   MRSA by PCR NEGATIVE NEGATIVE Final    Comment:        The GeneXpert MRSA Assay (FDA approved for NASAL specimens only), is one component of a comprehensive MRSA colonization surveillance program. It is not intended to diagnose MRSA infection nor to guide or monitor treatment for MRSA infections.     Radiology Reports Dg Chest 2 View  04/07/2015  CLINICAL DATA:  Pt c/o left-sided chest pain, SOB, cough, and fever x 3 days. Hx of HTN and GERD. Pt is a former smoker. EXAM: CHEST - 2 VIEW COMPARISON:  04/05/2015 FINDINGS: Low lung volumes. Linear subsegmental atelectasis in the lung bases as before. No overt edema. Heart size normal. No pneumothorax. No effusion. Spurring in the mid thoracic spine. IMPRESSION: Low volumes with bibasilar atelectasis.  No acute disease. Electronically Signed   By: Lucrezia Europe M.D.   On: 04/07/2015 12:01   Dg Chest 2 View  04/05/2015  CLINICAL DATA:  55 year old female with dizziness for several days, increasing. Cough and weakness. Initial encounter. EXAM: CHEST  2 VIEW COMPARISON:  Chest CT 03/22/2015 and earlier. FINDINGS: Large body habitus. Low lung volumes. Bibasilar atelectasis. Stable cardiac size and mediastinal contours. Visualized tracheal air column is within normal limits. No  pneumothorax or pulmonary edema. No pleural effusion. No acute osseous abnormality identified. IMPRESSION: Very low lung volumes with bibasilar atelectasis. Electronically Signed   By: Genevie Ann M.D.   On: 04/05/2015 11:35        Ct Angio Chest Pe W/cm &/or Wo Cm  04/07/2015  CLINICAL DATA:  SHOB AND SOME LEFT SIDED CHEST PAIN SINCE THIS AM 0500 RASH TO BACK AND ARM WITH RECENT ANTIBIOTIC STARTED Thursday PMH: HTN, GERD (gastroesophageal reflux disease); Tumors; Knee injury; Depression EXAM: CT ANGIOGRAPHY CHEST WITH CONTRAST TECHNIQUE: Multidetector CT imaging of the chest was performed using the standard protocol during bolus administration of intravenous contrast. Multiplanar CT image reconstructions and MIPs were obtained to evaluate the vascular anatomy. CONTRAST:  24mL OMNIPAQUE IOHEXOL 350 MG/ML SOLN COMPARISON:  03/22/2015 FINDINGS: Right arm IV contrast injection. The SVC is patent. Right atrium nondilated. RV is nondilated. Satisfactory opacification of pulmonary arteries noted, and there is no evidence of pulmonary emboli. Patent pulmonary veins. Adequate contrast opacification of the thoracic aorta with no evidence of dissection, aneurysm, or stenosis. There is classic 3-vessel brachiocephalic arch anatomy without proximal stenosis. No pleural or pericardial effusion. Prominent axillary, left cervical level 4 and supraclavicular lymph nodes, new since prior study. Subsegmental atelectasis in basilar segments of both lower lobes. Flowing osteophytes across several contiguous levels in the lower thoracic spine. Sternum intact. Borderline splenomegaly suspected, new. Remainder visualized portions of upper abdomen unremarkable. Review of the MIP images confirms the above findings. IMPRESSION: 1. Negative for acute PE or thoracic aortic dissection. 2. Progressive splenomegaly as well as prominence of bilateral axillary, left subclavian and cervical lymph nodes, possibly reactive but nonspecific. Follow-up  suggested to exclude neoplasm or lymphoproliferative process. Electronically Signed   By: Lucrezia Europe M.D.   On: 04/07/2015 14:39     CBC  Recent Labs Lab 04/07/15 1210 04/07/15 2031 04/08/15 0207 04/09/15 0659 04/11/15 0516  WBC 2.7* 1.9*  1.9* 3.7* 4.3  HGB 9.5* 8.8* 8.8* 8.6* 8.6*  HCT 29.5* 27.5* 27.4* 26.9* 28.9*  PLT 218 213 231 235 299  MCV 75.8* 76.2* 76.8* 77.5* 79.2  MCH 24.4* 24.4* 24.6* 24.8* 23.6*  MCHC 32.2 32.0 32.1 32.0 29.8*  RDW 21.0* 21.0* 21.5* 21.9* 21.7*  LYMPHSABS 0.6*  --  0.6* 1.6 1.1  MONOABS 0.1  --  0.1 0.3 0.2  EOSABS 0.0  --  0.0 0.0 0.0  BASOSABS 0.0  --  0.0 0.0 0.0    Chemistries   Recent Labs Lab 04/07/15 1210 04/07/15 2031 04/09/15 0659 04/11/15 0516 04/12/15 0643  NA 137  --  140 140 140  K 3.3*  --  3.4* 3.4* 3.5  CL 100*  --  104 101 103  CO2 27  --  28 32 27  GLUCOSE 97  --  86 100* 98  BUN 9  --  9 6 5*  CREATININE 0.77 0.69 0.58 0.57 0.55  CALCIUM 8.7*  --  8.2* 8.6* 8.7*  MG  --   --   --   --  2.1  AST  --   --  54*  --   --   ALT  --   --  35  --   --   ALKPHOS  --   --  54  --   --   BILITOT  --   --  0.9  --   --    ------------------------------------------------------------------------------------------------------------------ No results for input(s): CHOL, HDL, LDLCALC, TRIG, CHOLHDL, LDLDIRECT in the last 72 hours.  Lab Results  Component Value Date   HGBA1C 6.1 07/21/2013   ------------------------------------------------------------------------------------------------------------------ No results for input(s): TSH, T4TOTAL, T3FREE, THYROIDAB in the last 72 hours.  Invalid input(s): FREET3 ------------------------------------------------------------------------------------------------------------------ No results for input(s): VITAMINB12, FOLATE, FERRITIN, TIBC, IRON, RETICCTPCT in the last 72 hours.  Coagulation profile No results for input(s): INR, PROTIME in the last 168 hours.  No results for  input(s): DDIMER in the last 72 hours.  Cardiac Enzymes  Recent Labs Lab 04/08/15 0555 04/08/15 1053 04/08/15 1657  TROPONINI <0.03 <0.03 <0.03   ------------------------------------------------------------------------------------------------------------------    Component Value Date/Time   BNP 8.6 04/07/2015 1210    Time Spent in minutes  35   Orlandria Kissner K M.D on 04/12/2015 at 11:20 AM  Between 7am to 7pm - Pager - [phone]  After 7pm go to [email or web address] - password [facility]  [facility] -  Office  [phone]

## 2015-04-13 NOTE — Care Management Note (Signed)
Case Management Note  Patient Details  Name: Frances Medina MRN: [id number] Date of Birth: [birth year]  Subjective/Objective:                    Action/Plan: Plan is for patient to discharge home with [facility] services. CM met with the patient and provided her a list of agencies in the [facility] area. She selected [facility]. Tiffany with [facility] notified and accepted the referral. Bedside RN updated.   Expected Discharge Date:                  Expected Discharge Plan:  [place]  In-House Referral:     Discharge planning Services  CM Consult  Post Acute Care Choice:  Home Health Choice offered to:  Patient  DME Arranged:    DME Agency:     HH Arranged:  RN, PT, Respirator Therapy [place] Agency:  [facility]  Status of Service:  Completed, signed off  Medicare Important Message Given:    Date Medicare IM Given:    Medicare IM give by:    Date Additional Medicare IM Given:    Additional Medicare Important Message give by:     If discussed at [place] of Stay Meetings, dates discussed:    Additional Comments:  Pollie Friar, RN 04/13/2015, 11:53 AM

## 2015-04-13 NOTE — Progress Notes (Signed)
Pt became agitated with staff during admin of IGG infusion. Stated that her VS were not being taken and recorded often enough for infusion. Bp were taken at beginning of infusion and after 30 min. And at 1 hour. No adverse reactions noted. Offered pt copy of [facility] policy on IGG and offered explanation. However the patient does not wish to be informed of policy, and is content with prior belief about process. Pt relations at bedside during encounter.

## 2015-04-13 NOTE — Progress Notes (Signed)
Patient had a VC of 1.3L and a NIF of -60 with good effort.

## 2015-04-13 NOTE — Progress Notes (Signed)
Pt transported to lobby per wc in no acute distress. No adverse reactions from IGG noted. Dc instructions given, transferred to care of daughter at car.

## 2015-04-13 NOTE — Discharge Summary (Signed)
Frances Medina, is a 55 y.o. female  DOB 1960/03/13  MRN [id number].  Admission date:  04/07/2015  Admitting Physician  Delfina Redwood, MD  Discharge Date:  04/13/2015   Primary MD  [name]., DO  Recommendations for primary care physician for things to follow:   Check CBC, BMP in 3 days, monitor prednisone taper under the guidance of neurology.   Admission Diagnosis  Shortness of breath [R06.02]   Discharge Diagnosis  Shortness of breath [R06.02]     Principal Problem:   Dyspnea Active Problems:   Obesity   Myasthenia gravis ([place])   E. coli UTI   Hypokalemia   Anemia   Neutropenia (HCC)   Lymphadenopathy   Splenomegaly   Chest wall pain      Past Medical History  Diagnosis Date  . GERD (gastroesophageal reflux disease)   . 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 Knee injury   . Depression   . Seasonal allergies     takes Zytrec  . Fibroid uterus     size of a dime  . Umbilical hernia     watching , no plans for surgery at present  . H/O hiatal hernia   . Hypertension     "went away when I stopped smoking"  . Anxiety   . Migraine     "maybe couple times/month" (03/20/2015)  . Arthritis     "knees" (03/20/2015)  . Osteoarthritis of right knee 08/30/2013  . Osteoarthritis of left knee 12/02/2013  . Myasthenia gravis ([place]) 2017    Past Surgical History  Procedure Laterality Date  . Cesarean section  1987; 1989  . Dilation and curettage of uterus    . Tubal ligation  1989  . Vaginal hysterectomy  1990's?    "apparently took out one of my ovaries at the time too cause one's missing"  . Partial knee arthroplasty Right 08/30/2013    Procedure: RIGHT UNICOMPARTMENTAL KNEE;  Surgeon: Johnny Bridge, MD;  Location: [facility];  Service: Orthopedics;  Laterality: Right;  . Colonoscopy with propofol  N/A 09/19/2013    Procedure: COLONOSCOPY WITH PROPOFOL;  Surgeon: Ladene Artist, MD;  Location: [facility];  Service: Endoscopy;  Laterality: N/A;  . Partial knee arthroplasty Left 12/02/2013    Procedure: LEFT KNEE UNI ARTHROPLASTY;  Surgeon: Johnny Bridge, MD;  Location: [facility];  Service: Orthopedics;  Laterality: Left;  . Esophagogastroduodenoscopy (egd) with propofol N/A 03/21/2015    Procedure: ESOPHAGOGASTRODUODENOSCOPY (EGD) WITH PROPOFOL;  Surgeon: Gatha Mayer, MD;  Location: [facility];  Service: Endoscopy;  Laterality: N/A;       HPI  from the history and physical done on the day of admission:   Frances Medina is a 55 y.o. female Was recently diagnosed myasthenia gravis presents to the emergency room with a several-day history of worsening shortness of breath. She came to the emergency room a few days ago and was diagnosed with urinary tract infection, given a prescription for  ciprofloxacin. Patient noticed a nonpruritic rash on her back and her difficulty breathing started after she received ciprofloxacin. She has had difficulty swallowing since being diagnosed with myasthenia gravis and takes mainly a liquid diet. In the emergency room, she had a CT angiogram of the chest which showed no PE. It did show lymphadenopathy and splenomegaly. EDP Discussed with Dr. Marin Olp who recommended mono testing and outpatient follow-up with hematology. Patient received several nebulizer treatments and remained short of breath. Her oxygenation is ranging 88 - 96 percent on room air.     Hospital Course:    1. Myesthenia crisis. Neurology following. She was treated with steroids along with IVIG for 3 doses, she has completed her IVIG treatment and is now almost completely symptom-free, her myasthenia crisis was exacerbated by recent UTI and Cipro use in the outpatient setting. She will for now continue her prednisone taper and home dose pyridostigmine, no resting shortness of  breath, resting if is more than -ve 60 , he has been cleared discharge by neurology. She will follow with her primary neurologist within a week along with PCP. Home health PT and RN have also been ordered. Currently ambulating on room air without any distress.  2. Nonspecific splenomegaly and thoracic lymphadenopathy noted on CT angiogram chest. Outpatient follow-up with hematology/oncology.  3. Hypokalemia. Replaced and stable.  4. Recent left it ptosis and dysphagia. Much improved with myasthenia treatment. Had extensive dysphagia workup recent admission. She will follow with [facility] after discharge.  5. Anemia of chronic disease. Supportive care and no need for transfusion.  6. Morbid obesity. Outpatient follow-up with PCP.        Discharge Condition: Stable  Follow UP  Follow-up Information    Follow up with [facility]. Schedule an appointment as soon as possible for a visit on 04/09/2015.   Why:  For chronic management of this issue   Contact information:   [address], [place] [address] [phone]      Follow up with PCP.   Why:  As needed      Follow up with [facility].   Specialty:  Emergency Medicine   Why:  As needed, If symptoms worsen   Contact information:   [id number] [place]      Follow up with Colin Benton R., DO. Schedule an appointment as soon as possible for a visit in 3 days.   Specialty:  Family Medicine   Contact information:   [place] [address] [phone]       Follow up with [facility]. Schedule an appointment as soon as possible for a visit in 1 week.   Contact information:   [address] [place] [phone]      Follow up with [facility]. Schedule an appointment as soon as possible for a visit in 1 week.    Specialty:  Gastroenterology   Contact information:   [address] [place] [phone]      Follow up with [facility], NI, MD. Schedule an appointment as soon as possible for a visit in 1 week.   Specialty:  Hematology and Oncology   Why:  For enlarged lymph nodes in the lungs need to be evaluated   Contact information:   [place] [id number] [phone]        Consults  obtained - Neuro  Diet and Activity recommendation: See Discharge Instructions below  Discharge Instructions           Discharge Instructions    Discharge instructions    Complete by:  As directed   Follow with Primary MD in 3 days   Get CBC, CMP, 2 view Chest X ray checked  by Primary MD next visit.    Activity: As tolerated with Full fall precautions use walker/cane & assistance as needed   Disposition Home     Diet:   Soft Heart Healthy  with feeding assistance and aspiration precautions.  For Heart failure patients - Check your Weight same time everyday, if you gain over 2 pounds, or you develop in leg swelling, experience more shortness of breath or chest pain, call your Primary MD immediately. Follow Cardiac Low Salt Diet and 1.5 lit/day fluid restriction.   On your next visit with your primary care physician please Get Medicines reviewed and adjusted.   Please request your Prim.MD to go over all Hospital Tests and Procedure/Radiological results at the follow up, please get all Hospital records sent to your Prim MD by signing hospital release before you go home.   If you experience worsening of your admission symptoms, develop shortness of breath, life threatening emergency, suicidal or homicidal thoughts you must seek medical attention immediately by calling 911 or calling your MD immediately  if symptoms less severe.  You Must read complete instructions/literature along with all the possible adverse reactions/side effects for all the  Medicines you take and that have been prescribed to you. Take any new Medicines after you have completely understood and accpet all the possible adverse reactions/side effects.   Do not drive, operating heavy machinery, perform activities at heights, swimming or participation in water activities or provide baby sitting services if your were admitted for syncope or siezures until you have seen by Primary MD or a Neurologist and advised to do so again.  Do not drive when taking Pain medications.    Do not take more than prescribed Pain, Sleep and Anxiety Medications  Special Instructions: If you have smoked or chewed Tobacco  in the last 2 yrs please stop smoking, stop any regular Alcohol  and or any Recreational drug use.  Wear Seat belts while driving.   Please note  You were cared for by a hospitalist during your hospital stay. If you have any questions about your discharge medications or the care you received while you were in the hospital after you are discharged, you can call the unit and asked to speak with the hospitalist on call if the hospitalist that took care of you is not available. Once you are discharged, your primary care physician will handle any further medical issues. Please note that NO REFILLS for any discharge medications will be authorized once you are discharged, as it is imperative that you return to your primary care physician (or establish a relationship with a primary care physician if you do not have one) for your aftercare needs so that they can reassess your need for medications and monitor your lab values.     Increase activity slowly    Complete by:  As directed              Discharge Medications       Medication List    STOP taking these medications        ciprofloxacin 500 MG tablet  Commonly known as:  CIPRO  TAKE these medications        albuterol 108 (90 Base) MCG/ACT inhaler  Commonly known as:  PROVENTIL HFA;VENTOLIN HFA  Inhale 2  puffs into the lungs every 4 (four) hours as needed for wheezing or shortness of breath.     benzonatate 100 MG capsule  Commonly known as:  TESSALON  Take 1 capsule (100 mg total) by mouth every 8 (eight) hours.     cetirizine 10 MG tablet  Commonly known as:  ZYRTEC  Take 10 mg by mouth daily as needed for allergies.     clonazePAM 1 MG tablet  Commonly known as:  KLONOPIN  Take 0.5-1 mg by mouth 2 (two) times daily as needed for anxiety.     estradiol 0.5 MG tablet  Commonly known as:  ESTRACE  Take 0.5 mg by mouth daily.     ferrous sulfate 325 (65 FE) MG tablet  Take 1 tablet (325 mg total) by mouth 2 (two) times daily with a meal.     guaiFENesin 600 MG 12 hr tablet  Commonly known as:  MUCINEX  Take 1 tablet (600 mg total) by mouth 2 (two) times daily.     ibuprofen 400 MG tablet  Commonly known as:  ADVIL,MOTRIN  Take 400 mg by mouth every 6 (six) hours as needed for mild pain.     ondansetron 4 MG disintegrating tablet  Commonly known as:  ZOFRAN ODT  Take 1 tablet (4 mg total) by mouth every 8 (eight) hours as needed for nausea or vomiting.     phentermine 37.5 MG capsule  Take 37.5 mg by mouth every morning.     predniSONE 20 MG tablet  Commonly known as:  DELTASONE  Take 2 tablets (40 mg total) by mouth daily with breakfast.     pyridostigmine 60 MG tablet  Commonly known as:  MESTINON  Take 1 tablet (60 mg total) by mouth every 8 (eight) hours. Generic OK     sertraline 100 MG tablet  Commonly known as:  ZOLOFT  Take 50 mg by mouth every morning.        Major procedures and Radiology Reports - PLEASE review detailed and final reports for all details, in brief -       Dg Chest 2 View  04/07/2015  CLINICAL DATA:  Pt c/o left-sided chest pain, SOB, cough, and fever x 3 days. Hx of HTN and GERD. Pt is a former smoker. EXAM: CHEST - 2 VIEW COMPARISON:  04/05/2015 FINDINGS: Low lung volumes. Linear subsegmental atelectasis in the lung bases as before. No  overt edema. Heart size normal. No pneumothorax. No effusion. Spurring in the mid thoracic spine. IMPRESSION: Low volumes with bibasilar atelectasis.  No acute disease. Electronically Signed   By: Lucrezia Europe M.D.   On: 04/07/2015 12:01   Dg Chest 2 View  04/05/2015  CLINICAL DATA:  55 year old female with dizziness for several days, increasing. Cough and weakness. Initial encounter. EXAM: CHEST  2 VIEW COMPARISON:  Chest CT 03/22/2015 and earlier. FINDINGS: Large body habitus. Low lung volumes. Bibasilar atelectasis. Stable cardiac size and mediastinal contours. Visualized tracheal air column is within normal limits. No pneumothorax or pulmonary edema. No pleural effusion. No acute osseous abnormality identified. IMPRESSION: Very low lung volumes with bibasilar atelectasis. Electronically Signed   By: Genevie Ann M.D.   On: 04/05/2015 11:35   Ct Head Wo Contrast  03/19/2015  CLINICAL DATA:  Dysphagia and left eye droop for 1 month. EXAM: CT  HEAD WITHOUT CONTRAST TECHNIQUE: Contiguous axial images were obtained from the base of the skull through the vertex without intravenous contrast. COMPARISON:  None. FINDINGS: The ventricles are normal in size and configuration. No extra-axial fluid collections are identified. The gray-white differentiation is normal. No CT findings for acute intracranial process such as hemorrhage or infarction. No mass lesions. The brainstem and cerebellum are grossly normal. The bony structures are intact. The paranasal sinuses and mastoid air cells are clear. The globes are intact. IMPRESSION: Normal head CT. Electronically Signed   By: Marijo Sanes M.D.   On: 03/19/2015 18:28   Ct Soft Tissue Neck W Contrast  03/20/2015  CLINICAL DATA:  55 year old female with history of Bell's palsy presenting with dysphagia and difficulty swallowing. EXAM: CT NECK WITH CONTRAST TECHNIQUE: Multidetector CT imaging of the neck was performed using the standard protocol following the bolus administration  of intravenous contrast. CONTRAST:  25mL OMNIPAQUE IOHEXOL 300 MG/ML  SOLN COMPARISON:  None FINDINGS: The visualized lung apices are clear. The trachea and central airways are patent. The esophagus is grossly unremarkable. No thyroid nodule identified. The visualized carotid arteries appear patent. Minimal noncalcified plaque noted along the proximal right ICA. The parotid, and submandibular glands appear unremarkable. There is no adenopathy. There is mild degenerative changes of the spine. No acute fracture. The visualized paranasal sinuses and mastoid air cells are clear. No retropharyngeal fluid collection or abscess identified. IMPRESSION: No acute neck pathology, specifically no findings to explain patient's dysphagia. Electronically Signed   By: Anner Crete M.D.   On: 03/20/2015 01:23   Ct Chest W Contrast  03/22/2015  CLINICAL DATA:  55 year old female with myasthenia gravis. EXAM: CT CHEST WITH CONTRAST TECHNIQUE: Multidetector CT imaging of the chest was performed during intravenous contrast administration. CONTRAST:  75mL OMNIPAQUE IOHEXOL 300 MG/ML  SOLN COMPARISON:  08/19/2013 chest radiograph. FINDINGS: Mediastinum/Nodes: There is no evidence of thymic tissue or thymic mass. No mediastinal mass or enlarged lymph nodes are identified. The heart and great vessels are unremarkable. There is no evidence of pericardial effusion. Lungs/Pleura: Mild bibasilar atelectasis/scarring noted. There is no evidence of mass, nodule, airspace disease or consolidation. No endobronchial or endotracheal lesions are identified. No pleural effusions are present. Upper abdomen: Unremarkable. Musculoskeletal: No acute or significant abnormalities identified. IMPRESSION: No evidence of thymic abnormality or mediastinal mass. Mild basilar atelectasis/scarring. Electronically Signed   By: Margarette Canada M.D.   On: 03/22/2015 21:38   Ct Angio Chest Pe W/cm &/or Wo Cm  04/07/2015  CLINICAL DATA:  SHOB AND SOME LEFT SIDED  CHEST PAIN SINCE THIS AM 0500 RASH TO BACK AND ARM WITH RECENT ANTIBIOTIC STARTED Thursday PMH: HTN, GERD (gastroesophageal reflux disease); Tumors; Knee injury; Depression EXAM: CT ANGIOGRAPHY CHEST WITH CONTRAST TECHNIQUE: Multidetector CT imaging of the chest was performed using the standard protocol during bolus administration of intravenous contrast. Multiplanar CT image reconstructions and MIPs were obtained to evaluate the vascular anatomy. CONTRAST:  9mL OMNIPAQUE IOHEXOL 350 MG/ML SOLN COMPARISON:  03/22/2015 FINDINGS: Right arm IV contrast injection. The SVC is patent. Right atrium nondilated. RV is nondilated. Satisfactory opacification of pulmonary arteries noted, and there is no evidence of pulmonary emboli. Patent pulmonary veins. Adequate contrast opacification of the thoracic aorta with no evidence of dissection, aneurysm, or stenosis. There is classic 3-vessel brachiocephalic arch anatomy without proximal stenosis. No pleural or pericardial effusion. Prominent axillary, left cervical level 4 and supraclavicular lymph nodes, new since prior study. Subsegmental atelectasis in basilar segments of both lower  lobes. Flowing osteophytes across several contiguous levels in the lower thoracic spine. Sternum intact. Borderline splenomegaly suspected, new. Remainder visualized portions of upper abdomen unremarkable. Review of the MIP images confirms the above findings. IMPRESSION: 1. Negative for acute PE or thoracic aortic dissection. 2. Progressive splenomegaly as well as prominence of bilateral axillary, left subclavian and cervical lymph nodes, possibly reactive but nonspecific. Follow-up suggested to exclude neoplasm or lymphoproliferative process. Electronically Signed   By: Lucrezia Europe M.D.   On: 04/07/2015 14:39   Mr Brain Wo Contrast  03/20/2015  CLINICAL DATA:  Slurred speech and difficulty speaking attributed to dry mouth. History of Bell's palsy, migraine, hypertension and tumors. EXAM: MRI  HEAD WITHOUT CONTRAST TECHNIQUE: Multiplanar, multiecho pulse sequences of the brain and surrounding structures were obtained without intravenous contrast. COMPARISON:  CT head January 16th 2017 FINDINGS: The ventricles and sulci are normal for patient's age. A few scattered subcentimeter supratentorial white matter FLAIR T2 hyperintensities noted. No abnormal parenchymal signal, mass lesions, mass effect. No reduced diffusion to suggest acute ischemia. No susceptibility artifact to suggest hemorrhage. No abnormal extra-axial fluid collections. No extra-axial masses though, contrast enhanced sequences would be more sensitive. Normal major intracranial vascular flow voids seen at the skull base. Ocular globes and orbital contents are unremarkable though not tailored for evaluation. Mildly expanded partial empty sella. No suspicious calvarial bone marrow signal. Craniocervical junction maintained. Visualized paranasal sinuses and mastoid air cells are well-aerated. IMPRESSION: No acute intracranial process. Mild white matter changes, most often seen with chronic small vessel ischemic disease. Partially empty sella. Electronically Signed   By: Elon Alas M.D.   On: 03/20/2015 02:53   Dg Swallowing Func-speech Pathology  03/22/2015  Objective Swallowing Evaluation:   Patient Details Name: Frances Medina MRN: [id number] Date of Birth: 05-Jul-1960 Today's Date: 03/22/2015 Time: SLP Start Time (ACUTE ONLY): 0955-SLP Stop Time (ACUTE ONLY): 1014 SLP Time Calculation (min) (ACUTE ONLY): 19 min Past Medical History: Past Medical History Diagnosis Date . GERD (gastroesophageal reflux disease)  . 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 Knee injury  . Depression  . Seasonal allergies    takes Zytrec . Fibroid uterus    size of a dime . Umbilical hernia    watching , no plans for surgery at present . H/O hiatal hernia  . Hypertension    "went away when I stopped smoking" . Anxiety  . Migraine    "maybe couple times/month" (03/20/2015) .  Arthritis    "knees" (03/20/2015) . Osteoarthritis of right knee 08/30/2013 . Osteoarthritis of left knee 12/02/2013 . Bell's palsy    affecting left eye vision Past Surgical History: Past Surgical History Procedure Laterality Date . Cesarean section  1987; 1989 . Dilation and curettage of uterus   . Tubal ligation  1989 . Vaginal hysterectomy  1990's?   "apparently took out one of my ovaries at the time too cause one's missing" . Partial knee arthroplasty Right 08/30/2013   Procedure: RIGHT UNICOMPARTMENTAL KNEE;  Surgeon: Johnny Bridge, MD;  Location: [facility];  Service: Orthopedics;  Laterality: Right; . Colonoscopy with propofol N/A 09/19/2013   Procedure: COLONOSCOPY WITH PROPOFOL;  Surgeon: Ladene Artist, MD;  Location: [facility];  Service: Endoscopy;  Laterality: N/A; . Partial knee arthroplasty Left 12/02/2013   Procedure: LEFT KNEE UNI ARTHROPLASTY;  Surgeon: Johnny Bridge, MD;  Location: [facility];  Service: Orthopedics;  Laterality: Left; . Esophagogastroduodenoscopy (egd) with propofol N/A 03/21/2015   Procedure: ESOPHAGOGASTRODUODENOSCOPY (EGD) WITH  PROPOFOL;  Surgeon: Gatha Mayer, MD;  Location: [facility];  Service: Endoscopy;  Laterality: N/A; HPI: Frances Medina is a 55 y.o. female with a past medical history significant for MO, hiatal hernia, anxiety and recent Bell's palsy who presents with difficulty swallowing and the sensation of something in her throat for 1 week. Subjective: pt describes persistent difficulties swallowing Assessment / Plan / Recommendation CHL IP CLINICAL IMPRESSIONS 03/22/2015 Therapy Diagnosis Mild pharyngeal phase dysphagia;Suspected primary esophageal dysphagia Clinical Impression Pt has c/o generalized oral weakness, although functionally she consumes thin liquids and purees without overt difficulties. A pharyngeal swallow is triggered in a timely manner and with good hyolaryngeal elevation, yet residue remains in the valleculae and pyrifrom sinuses  after the swallow. Boluses enter into the esophagus, but with backflow that carries them back into the pharynx, resulting in flash penetration of puree x1. Pt uses multiple, reflexive swallows to assist with pharyngeal clearance. Barium appeared to remain in the esophagus during brief scan, although MD was not present to confirm. Given the above, continue to suspect a primary esophageal dysphagia with concern for higher sub-UES pressures leading to pharyngeal residue. Recommend to consider additional f/u from GI and/or primary medical team to assess esophageal motility. For now, would recommend a full liquid diet and use of aspiration and esophageal precautions. Impact on safety and function Mild aspiration risk   CHL IP TREATMENT RECOMMENDATION 03/22/2015 Treatment Recommendations Therapy as outlined in treatment plan below   Prognosis 03/22/2015 Prognosis for Safe Diet Advancement Fair Barriers to Reach Goals (No Data) Barriers/Prognosis Comment -- CHL IP DIET RECOMMENDATION 03/22/2015 SLP Diet Recommendations Thin liquid Liquid Administration via Cup Medication Administration Crushed with puree Compensations Slow rate;Small sips/bites;Follow solids with liquid Postural Changes Remain semi-upright after after feeds/meals (Comment);Seated upright at 90 degrees   CHL IP OTHER RECOMMENDATIONS 03/22/2015 Recommended Consults Consider esophageal assessment Oral Care Recommendations Oral care BID Other Recommendations --   CHL IP FOLLOW UP RECOMMENDATIONS 03/22/2015 Follow up Recommendations (No Data)   CHL IP FREQUENCY AND DURATION 03/22/2015 Speech Therapy Frequency (ACUTE ONLY) min 2x/week Treatment Duration 2 weeks      CHL IP ORAL PHASE 03/22/2015 Oral Phase WFL Oral - Pudding Teaspoon -- Oral - Pudding Cup -- Oral - Honey Teaspoon -- Oral - Honey Cup -- Oral - Nectar Teaspoon -- Oral - Nectar Cup -- Oral - Nectar Straw -- Oral - Thin Teaspoon -- Oral - Thin Cup -- Oral - Thin Straw -- Oral - Puree -- Oral - Mech Soft --  Oral - Regular -- Oral - Multi-Consistency -- Oral - Pill -- Oral Phase - Comment --  CHL IP PHARYNGEAL PHASE 03/22/2015 Pharyngeal Phase Impaired Pharyngeal- Pudding Teaspoon -- Pharyngeal -- Pharyngeal- Pudding Cup -- Pharyngeal -- Pharyngeal- Honey Teaspoon -- Pharyngeal -- Pharyngeal- Honey Cup -- Pharyngeal -- Pharyngeal- Nectar Teaspoon -- Pharyngeal -- Pharyngeal- Nectar Cup -- Pharyngeal -- Pharyngeal- Nectar Straw -- Pharyngeal -- Pharyngeal- Thin Teaspoon -- Pharyngeal -- Pharyngeal- Thin Cup Pharyngeal residue - valleculae;Pharyngeal residue - pyriform Pharyngeal -- Pharyngeal- Thin Straw -- Pharyngeal -- Pharyngeal- Puree Pharyngeal residue - valleculae;Pharyngeal residue - pyriform;Pharyngeal residue - posterior pharnyx;Penetration/Apiration after swallow Pharyngeal Material enters airway, remains ABOVE vocal cords then ejected out Pharyngeal- Mechanical Soft -- Pharyngeal -- Pharyngeal- Regular -- Pharyngeal -- Pharyngeal- Multi-consistency -- Pharyngeal -- Pharyngeal- Pill -- Pharyngeal -- Pharyngeal Comment --  CHL IP CERVICAL ESOPHAGEAL PHASE 03/22/2015 Cervical Esophageal Phase Impaired Pudding Teaspoon -- Pudding Cup -- Honey Teaspoon -- Honey Cup -- [employer]  Teaspoon -- Nectar Cup -- Nectar Straw -- Thin Teaspoon -- Thin Cup Esophageal backflow into the pharynx Thin Straw -- Puree Esophageal backflow into the pharynx;Esophageal backflow into the larynx Mechanical Soft -- Regular -- Multi-consistency -- Pill -- Cervical Esophageal Comment -- CHL IP GO 03/20/2015 Functional Assessment Tool Used skilled clinical judgment Functional Limitations Swallowing Swallow Current Status [id number]) CK Swallow Goal Status [id number]) [place] Discharge Status [id number]) (None) Motor Speech Current Status [id number]) (None) Motor Speech Goal Status [id number]) (None) Motor Speech Goal Status [id number]) (None) Spoken Language Comprehension Current Status [id number]) (None) Spoken Language Comprehension Goal Status [id number]) (None) Spoken  Language Comprehension Discharge Status [phone]) (None) Spoken Language Expression Current Status [phone]) (None) Spoken Language Expression Goal Status [id number]) (None) Spoken Language Expression Discharge Status [phone]) (None) Attention Current Status [id number]) (None) Attention Goal Status [id number]) (None) Attention Discharge Status [id number]) (None) Memory Current Status [id number]) (None) Memory Goal Status [id number]) (None) Memory Discharge Status QC:[id number]) (None) Voice Current Status [id number]) (None) Voice Goal Status [id number]) (None) Voice Discharge Status [id number]) (None) Other Speech-Language Pathology Functional Limitation [phone]) (None) Other Speech-Language Pathology Functional Limitation Goal Status [id number]) (None) Other Speech-Language Pathology Functional Limitation Discharge Status [phone]) (None) Germain Osgood, M.A. CCC-SLP [phone] Germain Osgood 03/22/2015, 11:21 AM               Micro Results      Recent Results (from the past 240 hour(s))  Urine culture     Status: None   Collection Time: 04/05/15 11:37 AM  Result Value Ref Range Status   Specimen Description URINE, CLEAN CATCH  Final   Special Requests NONE  Final   Culture   Final    >=100,000 COLONIES/mL ESCHERICHIA COLI 80,000 COLONIES/ml ESCHERICHIA COLI    Report Status 04/08/2015 FINAL  Final   Organism ID, Bacteria ESCHERICHIA COLI  Final   Organism ID, Bacteria ESCHERICHIA COLI  Final      Susceptibility   Escherichia coli - MIC*    AMPICILLIN >=32 RESISTANT Resistant     CEFAZOLIN <=4 SENSITIVE Sensitive     CEFTRIAXONE <=1 SENSITIVE Sensitive     CIPROFLOXACIN <=0.25 SENSITIVE Sensitive     GENTAMICIN >=16 RESISTANT Resistant     IMIPENEM <=0.25 SENSITIVE Sensitive     NITROFURANTOIN <=16 SENSITIVE Sensitive     TRIMETH/SULFA <=20 SENSITIVE Sensitive     AMPICILLIN/SULBACTAM 16 INTERMEDIATE Intermediate     PIP/TAZO <=4 SENSITIVE Sensitive    Escherichia coli - MIC*    AMPICILLIN 4 SENSITIVE Sensitive     CEFAZOLIN <=4  SENSITIVE Sensitive     CEFTRIAXONE <=1 SENSITIVE Sensitive     CIPROFLOXACIN >=4 RESISTANT Resistant     GENTAMICIN <=1 SENSITIVE Sensitive     IMIPENEM <=0.25 SENSITIVE Sensitive     NITROFURANTOIN <=16 SENSITIVE Sensitive     TRIMETH/SULFA <=20 SENSITIVE Sensitive     AMPICILLIN/SULBACTAM <=2 SENSITIVE Sensitive     PIP/TAZO <=4 SENSITIVE Sensitive     * >=100,000 COLONIES/mL ESCHERICHIA COLI    80,000 COLONIES/ml ESCHERICHIA COLI  Urine culture     Status: None   Collection Time: 04/07/15  1:49 PM  Result Value Ref Range Status   Specimen Description URINE, CLEAN CATCH  Final   Special Requests NONE  Final   Culture >=100,000 COLONIES/mL ESCHERICHIA COLI  Final   Report Status 04/09/2015 FINAL  Final   Organism ID, Bacteria ESCHERICHIA COLI  Final      Susceptibility   Escherichia coli - MIC*    AMPICILLIN 8  SENSITIVE Sensitive     CEFAZOLIN <=4 SENSITIVE Sensitive     CEFTRIAXONE <=1 SENSITIVE Sensitive     CIPROFLOXACIN >=4 RESISTANT Resistant     GENTAMICIN 4 SENSITIVE Sensitive     IMIPENEM 0.5 SENSITIVE Sensitive     NITROFURANTOIN <=16 SENSITIVE Sensitive     TRIMETH/SULFA <=20 SENSITIVE Sensitive     AMPICILLIN/SULBACTAM 4 SENSITIVE Sensitive     PIP/TAZO <=4 SENSITIVE Sensitive     * >=100,000 COLONIES/mL ESCHERICHIA COLI  MRSA PCR Screening     Status: None   Collection Time: 04/07/15  8:34 PM  Result Value Ref Range Status   MRSA by PCR NEGATIVE NEGATIVE Final    Comment:        The GeneXpert MRSA Assay (FDA approved for NASAL specimens only), is one component of a comprehensive MRSA colonization surveillance program. It is not intended to diagnose MRSA infection nor to guide or monitor treatment for MRSA infections.        Today   Subjective    Frances Medina today has no headache,no chest abdominal pain,no new weakness tingling or numbness, feels much better wants to go home today.     Objective   Blood pressure 113/69, pulse 90, temperature  97.5 F (36.4 C), temperature source Oral, resp. rate 18, height 5' (1.524 m), weight 121.3 kg (267 lb 6.7 oz), SpO2 96 %.   Intake/Output Summary (Last 24 hours) at 04/13/15 1058 Last data filed at 04/13/15 0828  Gross per 24 hour  Intake    220 ml  Output      0 ml  Net    220 ml    Exam Awake Alert, Oriented x 3, No new F.N deficits, Normal affect [place].AT,PERRAL, mild L ptosis Supple Neck,No JVD, No cervical lymphadenopathy appriciated.  Symmetrical Chest wall movement, Good air movement bilaterally, CTAB RRR,No Gallops,Rubs or new Murmurs, No Parasternal Heave +ve B.Sounds, Abd Soft, Non tender, No organomegaly appriciated, No rebound -guarding or rigidity. No Cyanosis, Clubbing or edema, No new Rash or bruise   Data Review   CBC w Diff:  Lab Results  Component Value Date   WBC 4.3 04/11/2015   HGB 8.6* 04/11/2015   HCT 28.9* 04/11/2015   PLT 299 04/11/2015   LYMPHOPCT 26 04/11/2015   MONOPCT 5 04/11/2015   EOSPCT 0 04/11/2015   BASOPCT 0 04/11/2015    CMP:  Lab Results  Component Value Date   NA 140 04/12/2015   K 3.5 04/12/2015   CL 103 04/12/2015   CO2 27 04/12/2015   BUN 5* 04/12/2015   CREATININE 0.55 04/12/2015   PROT 7.0 04/09/2015   ALBUMIN 2.9* 04/09/2015   BILITOT 0.9 04/09/2015   ALKPHOS 54 04/09/2015   AST 54* 04/09/2015   ALT 35 04/09/2015  .   Total Time in preparing paper work, data evaluation and todays exam - 35 minutes  Thurnell Lose M.D on 04/13/2015 at 10:58 AM  [facility]   Office  [phone]

## 2015-04-13 NOTE — Discharge Instructions (Signed)
Follow with Primary MD in 3 days   Get CBC, CMP, 2 view Chest X ray checked  by Primary MD next visit.    Activity: As tolerated with Full fall precautions use walker/cane & assistance as needed   Disposition Home     Diet:   Soft Heart Healthy  with feeding assistance and aspiration precautions.  For Heart failure patients - Check your Weight same time everyday, if you gain over 2 pounds, or you develop in leg swelling, experience more shortness of breath or chest pain, call your Primary MD immediately. Follow Cardiac Low Salt Diet and 1.5 lit/day fluid restriction.   On your next visit with your primary care physician please Get Medicines reviewed and adjusted.   Please request your Prim.MD to go over all Hospital Tests and Procedure/Radiological results at the follow up, please get all Hospital records sent to your Prim MD by signing hospital release before you go home.   If you experience worsening of your admission symptoms, develop shortness of breath, life threatening emergency, suicidal or homicidal thoughts you must seek medical attention immediately by calling 911 or calling your MD immediately  if symptoms less severe.  You Must read complete instructions/literature along with all the possible adverse reactions/side effects for all the Medicines you take and that have been prescribed to you. Take any new Medicines after you have completely understood and accpet all the possible adverse reactions/side effects.   Do not drive, operating heavy machinery, perform activities at heights, swimming or participation in water activities or provide baby sitting services if your were admitted for syncope or siezures until you have seen by Primary MD or a Neurologist and advised to do so again.  Do not drive when taking Pain medications.    Do not take more than prescribed Pain, Sleep and Anxiety Medications  Special Instructions: If you have smoked or chewed Tobacco  in the last 2 yrs  please stop smoking, stop any regular Alcohol  and or any Recreational drug use.  Wear Seat belts while driving.   Please note  You were cared for by a hospitalist during your hospital stay. If you have any questions about your discharge medications or the care you received while you were in the hospital after you are discharged, you can call the unit and asked to speak with the hospitalist on call if the hospitalist that took care of you is not available. Once you are discharged, your primary care physician will handle any further medical issues. Please note that NO REFILLS for any discharge medications will be authorized once you are discharged, as it is imperative that you return to your primary care physician (or establish a relationship with a primary care physician if you do not have one) for your aftercare needs so that they can reassess your need for medications and monitor your lab values.

## 2015-04-13 NOTE — Progress Notes (Signed)
Subjective: Feeling much better, no SOB.   Exam: Filed Vitals:   04/13/15 0213 04/13/15 0544  BP: 126/85 127/87  Pulse: 76 85  Temp: 98.5 F (36.9 C) 98 F (36.7 C)  Resp: 20 20    HEENT-  Normocephalic, no lesions, without obvious abnormality.  Normal external eye and conjunctiva.  Normal TM's bilaterally.  Normal auditory canals and external ears. Normal external nose, mucus membranes and septum.  Normal pharynx. Cardiovascular- S1, S2 normal, pulses palpable throughout   Lungs- chest clear, no wheezing, rales, normal symmetric air entry Abdomen- normal findings: bowel sounds normal Extremities- no edema Lymph-no adenopathy palpable Musculoskeletal-no joint tenderness, deformity or swelling Skin-warm and dry, no hyperpigmentation, vitiligo, or suspicious lesions    Gen: In bed, NAD MS: alert and oriented CN: 2-12 intact Motor: MAEW Sensory: intact   Pertinent Labs: none  Etta Quill PA-C [facility] [phone]  Impression: [name] with myasthenic exacerbation 2/2 cipro. Improved with IVIG. Last dose today.    Recommendations: 1) no further recommendations. Continue current dose of mestinon and prednisone. Follow up with her outpatient neurologist --S/O   04/13/2015, 8:45 AM

## 2015-04-13 NOTE — Progress Notes (Signed)
PT Cancellation Note  Patient Details Name: ETHA SUMAN MRN: [id number] DOB: October 03, 1960   Cancelled Treatment:    Reason Eval/Treat Not Completed: Patient receiving IGG infusion. Noted earlier events with RN. RN requested we not mobilize patient while receiving infusion. Patient ambulated 200 ft with RW and did stair training 2/10.  OK to d/c from hospital after infusion from a PT perspective.   Jeffifer Rabold 04/13/2015, 3:52 PM Pager [phone]

## 2015-04-23 ENCOUNTER — Ambulatory Visit: Payer: BC Managed Care – PPO | Admitting: Neurology

## 2015-04-24 ENCOUNTER — Telehealth: Payer: Self-pay | Admitting: Hematology

## 2015-04-24 NOTE — Telephone Encounter (Signed)
new patient appt-s/w patient and gave np appt for 02/28 @ 11 w/Dr. Irene Limbo, patient seen in ED and was told to f/u with [facility]  Dx- Lymphadenopathy/Splenomegaly

## 2015-05-01 ENCOUNTER — Ambulatory Visit (INDEPENDENT_AMBULATORY_CARE_PROVIDER_SITE_OTHER): Payer: BC Managed Care – PPO | Admitting: Family Medicine

## 2015-05-01 ENCOUNTER — Ambulatory Visit: Payer: BC Managed Care – PPO | Admitting: Hematology

## 2015-05-01 ENCOUNTER — Encounter: Payer: Self-pay | Admitting: Family Medicine

## 2015-05-01 VITALS — BP 140/93 | HR 85 | Temp 97.7°F | Wt 267.0 lb

## 2015-05-01 DIAGNOSIS — F419 Anxiety disorder, unspecified: Secondary | ICD-10-CM

## 2015-05-01 DIAGNOSIS — R591 Generalized enlarged lymph nodes: Secondary | ICD-10-CM

## 2015-05-01 DIAGNOSIS — R5383 Other fatigue: Secondary | ICD-10-CM | POA: Diagnosis not present

## 2015-05-01 DIAGNOSIS — G7000 Myasthenia gravis without (acute) exacerbation: Secondary | ICD-10-CM

## 2015-05-01 DIAGNOSIS — D649 Anemia, unspecified: Secondary | ICD-10-CM

## 2015-05-01 DIAGNOSIS — F418 Other specified anxiety disorders: Secondary | ICD-10-CM | POA: Diagnosis not present

## 2015-05-01 DIAGNOSIS — F329 Major depressive disorder, single episode, unspecified: Secondary | ICD-10-CM

## 2015-05-01 DIAGNOSIS — IMO0001 Reserved for inherently not codable concepts without codable children: Secondary | ICD-10-CM

## 2015-05-01 DIAGNOSIS — F32A Depression, unspecified: Secondary | ICD-10-CM

## 2015-05-01 DIAGNOSIS — R030 Elevated blood-pressure reading, without diagnosis of hypertension: Secondary | ICD-10-CM

## 2015-05-01 DIAGNOSIS — R161 Splenomegaly, not elsewhere classified: Secondary | ICD-10-CM

## 2015-05-01 DIAGNOSIS — Z114 Encounter for screening for human immunodeficiency virus [HIV]: Secondary | ICD-10-CM

## 2015-05-01 LAB — CBC WITH DIFFERENTIAL/PLATELET
Basophils Absolute: 0 10*3/uL (ref 0.0–0.1)
Basophils Relative: 0 % (ref 0–1)
Eosinophils Absolute: 0 10*3/uL (ref 0.0–0.7)
Eosinophils Relative: 0 % (ref 0–5)
HCT: 35.1 % — ABNORMAL LOW (ref 36.0–46.0)
Hemoglobin: 11 g/dL — ABNORMAL LOW (ref 12.0–15.0)
Lymphocytes Relative: 24 % (ref 12–46)
Lymphs Abs: 1.2 10*3/uL (ref 0.7–4.0)
MCH: 25.8 pg — ABNORMAL LOW (ref 26.0–34.0)
MCHC: 31.3 g/dL (ref 30.0–36.0)
MCV: 82.2 fL (ref 78.0–100.0)
MPV: 10.6 fL (ref 8.6–12.4)
Monocytes Absolute: 0.4 10*3/uL (ref 0.1–1.0)
Monocytes Relative: 7 % (ref 3–12)
Neutro Abs: 3.6 10*3/uL (ref 1.7–7.7)
Neutrophils Relative %: 69 % (ref 43–77)
Platelets: 373 10*3/uL (ref 150–400)
RBC: 4.27 MIL/uL (ref 3.87–5.11)
RDW: 19.1 % — ABNORMAL HIGH (ref 11.5–15.5)
WBC: 5.2 10*3/uL (ref 4.0–10.5)

## 2015-05-01 LAB — TSH: TSH: 2.71 mIU/L

## 2015-05-01 NOTE — Patient Instructions (Signed)
How to Take Your Blood Pressure °HOW DO I GET A BLOOD PRESSURE MACHINE? °· You can buy an electronic home blood pressure machine at your local pharmacy. Insurance will sometimes cover the cost if you have a prescription. °· Ask your doctor what type of machine is best for you. There are different machines for your arm and your wrist. °· If you decide to buy a machine to check your blood pressure on your arm, first check the size of your arm so you can buy the right size cuff. To check the size of your arm:   °¨ Use a measuring tape that shows both inches and centimeters.   °¨ Wrap the measuring tape around the upper-middle part of your arm. You may need someone to help you measure.   °¨ Write down your arm measurement in both inches and centimeters.   °· To measure your blood pressure correctly, it is important to have the right size cuff.   °¨ If your arm is up to 13 inches (up to 34 centimeters), get an adult cuff size. °¨ If your arm is 13 to 17 inches (35 to 44 centimeters), get a large adult cuff size.   °¨  If your arm is 17 to 20 inches (45 to 52 centimeters), get an adult thigh cuff.   °WHAT DO THE NUMBERS MEAN?  °· There are two numbers that make up your blood pressure. For example: 120/80. °¨ The first number (120 in our example) is called the "systolic pressure." It is a measure of the pressure in your blood vessels when your heart is pumping blood. °¨ The second number (80 in our example) is called the "diastolic pressure." It is a measure of the pressure in your blood vessels when your heart is resting between beats. °· Your doctor will tell you what your blood pressure should be. °WHAT SHOULD I DO BEFORE I CHECK MY BLOOD PRESSURE?  °· Try to rest or relax for at least 30 minutes before you check your blood pressure. °· Do not smoke. °· Do not have any drinks with caffeine, such as: °¨ Soda. °¨ Coffee. °¨ Tea. °· Check your blood pressure in a quiet room. °· Sit down and stretch out your arm on a table.  Keep your arm at about the level of your heart. Let your arm relax. °· Make sure that your legs are not crossed. °HOW DO I CHECK MY BLOOD PRESSURE? °· Follow the directions that came with your machine. °· Make sure you remove any tight-fitting clothing from your arm or wrist. Wrap the cuff around your upper arm or wrist. You should be able to fit a finger between the cuff and your arm. If you cannot fit a finger between the cuff and your arm, it is too tight and should be removed and rewrapped. °· Some units require you to manually pump up the arm cuff. °· Automatic units inflate the cuff when you press a button. °· Cuff deflation is automatic in both models. °· After the cuff is inflated, the unit measures your blood pressure and pulse. The readings are shown on a monitor. Hold still and breathe normally while the cuff is inflated. °· Getting a reading takes less than a minute. °· Some models store readings in a memory. Some provide a printout of readings. If your machine does not store your readings, keep a written record. °· Take readings with you to your next visit with your doctor. °  °This information is not intended to replace advice given to   you by your health care provider. Make sure you discuss any questions you have with your health care provider. °  °Document Released: 01/31/2008 Document Revised: 03/10/2014 Document Reviewed: 04/14/2013 °Elsevier Interactive Patient Education ©2016 [employer] ° °

## 2015-05-01 NOTE — Assessment & Plan Note (Addendum)
Likely multifactorial, anemia, MG. She has hx of Vitamin D deficiency. Vitamin D level and TSH checked today.

## 2015-05-01 NOTE — Assessment & Plan Note (Signed)
Mildly elevated diastolic BP. Likely white coat HTN. Recheck recommended. Unfortunately there was no available BP machine inside or outside of patient's room for me to recheck. CMA asked to check prior to sending patient home. I will also f/u on this during her next visit.

## 2015-05-01 NOTE — Assessment & Plan Note (Signed)
Incidental finding. Differentials include recurrent infection vs blood malignancy. CBC checked today. Oncology referral already made and she has appointment on 05/03/15. I will f/u with her after oncology evaluation.

## 2015-05-01 NOTE — Assessment & Plan Note (Addendum)
Compliant with and stable on Mestinon. Very mild residual symptoms. Continue current dose of Mestinon per neurology. She has up coming neurology follow up on 05/28/15. All hospital notes and imaging related to this diagnosis was reviewed by me during this visit.

## 2015-05-01 NOTE — Assessment & Plan Note (Addendum)
Incidental finding. FMx of blood cancer. Oncology referral in place for further assessment and treatment. CBC rechecked today. HIV test done today.

## 2015-05-01 NOTE — Assessment & Plan Note (Signed)
No acute change. Previous PCP's note reviewed. Recommending tapering off Benzo. As discussed with her I will not support continuous refill on Benzo. I recommended Psychiatry evaluation and management. Referral placed.

## 2015-05-01 NOTE — Assessment & Plan Note (Signed)
Recent lab reviewed. Baseline around 8. She had normal iron study except for elevated ferritin. Vitamin B 12 was normal as well. ?? Anemia of chronic dx vs bone marrow disorder. Hematology oncology referral already made. CBC rechecked today.

## 2015-05-01 NOTE — Progress Notes (Signed)
Subjective:     Patient ID: Frances Medina, female   DOB: [birth year], 55 y.o.   MRN: [id number]  HPI Myasthenia gravis/Fatigue: here for follow up after recent hospitalization for dysphagia, facial droopiness, SOB. She was diagnosed with MG for which she confirmed treatment completion of IVIG. She also had prednisone taper. Now she is on Mestinon Still some phlegm retention in her esophagus, but this is better than before, she has been eating in small portion and chewing her meals well. She gets fatigued. She has been getting  HHA and a nurse visit since d/c from the hospital twice weekly and the last visit will be tomorrow. She feels she is now back at baseline. Lymphadenopathy: Here for follow up. She stated she was asked to see an oncologist for this. Denies any fever, she feels tired at times. She endorsed hx of leukemia and lymphoma in her siblings. Splenomegaly: Same as above. No fever. She has oncology follow up set up. Anemia: denies any dizziness, she feels weak and tired. She does not take iron replacement. Anxiety/Depression: On Sertraline and Klonopin for years. Denies any new symptoms. Denies suicidal ideation. Elevated [id number] her BP has been going up and down. She had been on antihypertensive agents in the past.  Current Outpatient Prescriptions on File Prior to Visit  Medication Sig Dispense Refill  . clonazePAM (KLONOPIN) 1 MG tablet Take 0.5-1 mg by mouth 2 (two) times daily as needed for anxiety.    [name] estradiol (ESTRACE) 0.5 MG tablet Take 0.5 mg by mouth daily.    [name] pyridostigmine (MESTINON) 60 MG tablet Take 1 tablet (60 mg total) by mouth every 8 (eight) hours. Generic OK 90 tablet 0  . sertraline (ZOLOFT) 100 MG tablet Take 50 mg by mouth every morning.     [name] albuterol (PROVENTIL HFA;VENTOLIN HFA) 108 (90 Base) MCG/ACT inhaler Inhale 2 puffs into the lungs every 4 (four) hours as needed for wheezing or shortness of breath. (Patient not taking: Reported on 05/01/2015) 1 Inhaler  0  . ferrous sulfate 325 (65 FE) MG tablet Take 1 tablet (325 mg total) by mouth 2 (two) times daily with a meal. (Patient not taking: Reported on 05/01/2015) 60 tablet 0  . predniSONE (DELTASONE) 20 MG tablet Take 2 tablets (40 mg total) by mouth daily with breakfast. (Patient not taking: Reported on 05/01/2015) 10 tablet 0   No current facility-administered medications on file prior to visit.   Past Medical History  Diagnosis Date  . GERD (gastroesophageal reflux disease)   . 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 Knee injury   . Depression   . Seasonal allergies     takes Zytrec  . Fibroid uterus     size of a dime  . Umbilical hernia     watching , no plans for surgery at present  . H/O hiatal hernia   . Hypertension     "went away when I stopped smoking"  . Anxiety   . Migraine     "maybe couple times/month" (03/20/2015)  . Arthritis     "knees" (03/20/2015)  . Osteoarthritis of right knee 08/30/2013  . Osteoarthritis of left knee 12/02/2013  . Myasthenia gravis ([place]) 2017   Filed Vitals:   05/01/15 0828  BP: 140/93  Pulse: 85  Temp: 97.7 F (36.5 C)  TempSrc: Oral  Weight: 267 lb (121.11 kg)     Review of Systems  Respiratory: Negative.   Cardiovascular: Negative.   Gastrointestinal: Negative.  Musculoskeletal: Negative.   Psychiatric/Behavioral: Negative for suicidal ideas and self-injury. The patient is not nervous/anxious.   All other systems reviewed and are negative.      Objective:   Physical Exam  Constitutional: She is oriented to person, place, and time. She appears well-developed. No distress.  Neck: Neck supple. No thyromegaly present.  Cardiovascular: Normal rate, regular rhythm and normal heart sounds.   No murmur heard. Pulmonary/Chest: Effort normal and breath sounds normal. No respiratory distress. She has no wheezes.  Abdominal: Soft. Bowel sounds are normal. She exhibits no distension and no mass. There is no tenderness.  Musculoskeletal: Normal  range of motion. She exhibits no edema.  Neurological: She is alert and oriented to person, place, and time. No cranial nerve deficit.  Psychiatric: She has a normal mood and affect. Her behavior is normal. Judgment and thought content normal.  Nursing note and vitals reviewed.      Assessment:     Myasthenia Gravis Lymphadenopathy Splenomegaly Anemia Anxiety Elevated BP.  Fatigue    Plan:     Check problem list.

## 2015-05-02 ENCOUNTER — Telehealth: Payer: Self-pay | Admitting: Family Medicine

## 2015-05-02 LAB — HIV ANTIBODY (ROUTINE TESTING W REFLEX): HIV 1&2 Ab, 4th Generation: NONREACTIVE

## 2015-05-02 LAB — VITAMIN D 25 HYDROXY (VIT D DEFICIENCY, FRACTURES): Vit D, 25-Hydroxy: 12 ng/mL — ABNORMAL LOW (ref 30–100)

## 2015-05-02 MED ORDER — VITAMIN D (ERGOCALCIFEROL) 1.25 MG (50000 UNIT) PO CAPS: 50000.0000 [IU] | ORAL_CAPSULE | ORAL | Status: DC

## 2015-05-02 NOTE — Telephone Encounter (Signed)
Test result discussed with patient.  Note vitamin D level is 12. I recommended supplement. She is advised to pick up her 50,000 units Vit D script at the pharmacy. We will recheck level in 8 wks.

## 2015-05-03 ENCOUNTER — Ambulatory Visit (HOSPITAL_BASED_OUTPATIENT_CLINIC_OR_DEPARTMENT_OTHER): Payer: BC Managed Care – PPO

## 2015-05-03 ENCOUNTER — Ambulatory Visit (HOSPITAL_BASED_OUTPATIENT_CLINIC_OR_DEPARTMENT_OTHER): Payer: BC Managed Care – PPO | Admitting: Hematology

## 2015-05-03 ENCOUNTER — Encounter: Payer: Self-pay | Admitting: Hematology

## 2015-05-03 ENCOUNTER — Telehealth: Payer: Self-pay | Admitting: Hematology

## 2015-05-03 VITALS — BP 149/80 | HR 95 | Temp 98.0°F | Resp 19 | Wt 270.0 lb

## 2015-05-03 DIAGNOSIS — R740 Nonspecific elevation of levels of transaminase and lactic acid dehydrogenase [LDH]: Secondary | ICD-10-CM

## 2015-05-03 DIAGNOSIS — R591 Generalized enlarged lymph nodes: Secondary | ICD-10-CM

## 2015-05-03 DIAGNOSIS — D649 Anemia, unspecified: Secondary | ICD-10-CM

## 2015-05-03 DIAGNOSIS — R590 Localized enlarged lymph nodes: Secondary | ICD-10-CM

## 2015-05-03 DIAGNOSIS — R161 Splenomegaly, not elsewhere classified: Secondary | ICD-10-CM

## 2015-05-03 DIAGNOSIS — R778 Other specified abnormalities of plasma proteins: Secondary | ICD-10-CM

## 2015-05-03 DIAGNOSIS — E559 Vitamin D deficiency, unspecified: Secondary | ICD-10-CM

## 2015-05-03 DIAGNOSIS — R7402 Elevation of levels of lactic acid dehydrogenase (LDH): Secondary | ICD-10-CM

## 2015-05-03 LAB — COMPREHENSIVE METABOLIC PANEL
ALT: 27 U/L (ref 0–55)
AST: 31 U/L (ref 5–34)
Albumin: 3.6 g/dL (ref 3.5–5.0)
Alkaline Phosphatase: 87 U/L (ref 40–150)
Anion Gap: 10 mEq/L (ref 3–11)
BUN: 6.9 mg/dL — ABNORMAL LOW (ref 7.0–26.0)
CO2: 25 mEq/L (ref 22–29)
Calcium: 9.5 mg/dL (ref 8.4–10.4)
Chloride: 106 mEq/L (ref 98–109)
Creatinine: 0.8 mg/dL (ref 0.6–1.1)
EGFR: >90 mL/min/{1.73_m2} (ref >90)
Glucose: 91 mg/dl (ref 70–140)
Potassium: 3.8 mEq/L (ref 3.5–5.1)
Sodium: 141 mEq/L (ref 136–145)
Total Bilirubin: 0.53 mg/dL (ref 0.20–1.20)
Total Protein: 8.2 g/dL (ref 6.4–8.3)

## 2015-05-03 LAB — CBC & DIFF AND RETIC
BASO%: 0 % (ref 0.0–2.0)
Basophils Absolute: 0 10*3/uL (ref 0.0–0.1)
EOS%: 0 % (ref 0.0–7.0)
Eosinophils Absolute: 0 10*3/uL (ref 0.0–0.5)
HCT: 36.6 % (ref 34.8–46.6)
HGB: 11.3 g/dL — ABNORMAL LOW (ref 11.6–15.9)
Immature Retic Fract: 7.4 % (ref 1.60–10.00)
LYMPH%: 21.4 % (ref 14.0–49.7)
MCH: 25.8 pg (ref 25.1–34.0)
MCHC: 30.9 g/dL — ABNORMAL LOW (ref 31.5–36.0)
MCV: 83.6 fL (ref 79.5–101.0)
MONO#: 0.4 10*3/uL (ref 0.1–0.9)
MONO%: 5.9 % (ref 0.0–14.0)
NEUT#: 4.6 10*3/uL (ref 1.5–6.5)
NEUT%: 72.7 % (ref 38.4–76.8)
Platelets: 344 10*3/uL (ref 145–400)
RBC: 4.38 10*6/uL (ref 3.70–5.45)
RDW: 18.3 % — ABNORMAL HIGH (ref 11.2–14.5)
Retic %: 2.21 % — ABNORMAL HIGH (ref 0.70–2.10)
Retic Ct Abs: 96.8 10*3/uL — ABNORMAL HIGH (ref 33.70–90.70)
WBC: 6.3 10*3/uL (ref 3.9–10.3)
lymph#: 1.4 10*3/uL (ref 0.9–3.3)

## 2015-05-03 LAB — LACTATE DEHYDROGENASE: LDH: 395 U/L — ABNORMAL HIGH (ref 125–245)

## 2015-05-03 LAB — TECHNOLOGIST REVIEW

## 2015-05-03 NOTE — Progress Notes (Signed)
[name]    HEMATOLOGY/ONCOLOGY CONSULTATION NOTE  Date of Service: 05/03/2015  Patient Care Team: [name], MD as PCP - General (Family Medicine)  CHIEF COMPLAINTS/PURPOSE OF CONSULTATION:   Lymphadenopathy, recent diagnosis of Myasthenia gravis  HISTORY OF PRESENTING ILLNESS:   Frances Medina is a wonderful 55 y.o. female who has been referred to [place] by Dr Lala Lund  for evaluation and management of Lymphadenopathy.  Patient has a history of hypertension,uterine fibroids, anxiety, migraines and recently diagnosed myasthenia gravis who was admitted to the hospital recently on 04/07/2015 with several day history of worsening shortness of breath.  She had a CT milligram of the chest that showed no pulmonary embolism but did show lymphadenopathy of her bilateral axillary, left subclavian and cervical lymph nodes and splenomegaly as well.  She was noted to have dysphagia related to her myasthenia and was noted to have a myasthenic crisis triggered by an Escherichia coli urinary tract infection.  She was seen by neurology and treated with high-dose steroids along with IVIG for 3 doses.  She was sent home on a prednisone taper and Mestinon with home physical therapy and home RN evaluation and management.  Her left eye ptosis and her dysphagia improved significantly with treatment.   She has completed her prednisone about 3 weeks ago and currently is only on Mestinon.  She has outpatient neurology followup.  She is here for further evaluation of her splenomegaly and lymphadenopathy. She notes no unexpected weight loss.  No fevers or chills.  No night sweats. Notes some abdominal fullness but no significant pain. Notes that her dysphagia is much improved on the Mestinon.  Has been having some generalized weakness. She notes no specific focal symptoms at this time.     MEDICAL HISTORY:  Past Medical History  Diagnosis Date  . GERD (gastroesophageal reflux disease)   . Tumors     "in my  stomach"  . Knee injury   . Depression   . Seasonal allergies     takes Zytrec  . Fibroid uterus     size of a dime  . Umbilical hernia     watching , no plans for surgery at present  . H/O hiatal hernia   . Hypertension     "went away when I stopped smoking"  . Anxiety   . Migraine     "maybe couple times/month" (03/20/2015)  . Arthritis     "knees" (03/20/2015)  . Osteoarthritis of right knee 08/30/2013  . Osteoarthritis of left knee 12/02/2013  . Myasthenia gravis ([place]) 2017  . E. coli UTI 04/07/2015    SURGICAL HISTORY: Past Surgical History  Procedure Laterality Date  . Cesarean section  1987; 1989  . Dilation and curettage of uterus    . Tubal ligation  1989  . Vaginal hysterectomy  1990's?    "apparently took out one of my ovaries at the time too cause one's missing"  . Partial knee arthroplasty Right 08/30/2013    Procedure: RIGHT UNICOMPARTMENTAL KNEE;  Surgeon: Johnny Bridge, MD;  Location: [facility];  Service: Orthopedics;  Laterality: Right;  . Colonoscopy with propofol N/A 09/19/2013    Procedure: COLONOSCOPY WITH PROPOFOL;  Surgeon: Ladene Artist, MD;  Location: [facility];  Service: Endoscopy;  Laterality: N/A;  . Partial knee arthroplasty Left 12/02/2013    Procedure: LEFT KNEE UNI ARTHROPLASTY;  Surgeon: Johnny Bridge, MD;  Location: [place] [facility];  Service: Orthopedics;  Laterality: Left;  . Esophagogastroduodenoscopy (egd) with  propofol N/A 03/21/2015    Procedure: ESOPHAGOGASTRODUODENOSCOPY (EGD) WITH PROPOFOL;  Surgeon: Gatha Mayer, MD;  Location: [facility];  Service: Endoscopy;  Laterality: N/A;    SOCIAL HISTORY: Social History   Social History  . Marital Status: Married    Spouse Name: N/A  . Number of Children: N/A  . Years of Education: N/A   Occupational History  . Not on file.   Social History Main Topics  . Smoking status: Former Smoker -- 0.50 packs/day for 40 years    Types: Cigarettes    Quit date: 05/14/2012  .  Smokeless tobacco: Never Used  . Alcohol Use: No  . Drug Use: No  . Sexual Activity: Not Currently    Birth Control/ Protection: Surgical   Other Topics Concern  . Not on file   Social History Narrative   Work or School: [employer] Situation: lives with husband and daughters      Spiritual Beliefs: Christian      Lifestyle: no regular exercise; trying to eat healthy             FAMILY HISTORY: Family History  Problem Relation Age of Onset  . Heart disease Mother 45  . Hypertension Mother   . Stroke Father   . Hypertension Father   . Colon cancer Neg Hx   . Other Sister     Esophageal strictures  . Anemia Sister     ALLERGIES:  is allergic to dicyclomine; methocarbamol; penicillins; ciprofloxacin; and shrimp.  MEDICATIONS:  Current Outpatient Prescriptions  Medication Sig Dispense Refill  . albuterol (PROVENTIL HFA;VENTOLIN HFA) 108 (90 Base) MCG/ACT inhaler Inhale 2 puffs into the lungs every 4 (four) hours as needed for wheezing or shortness of breath. (Patient not taking: Reported on 05/01/2015) 1 Inhaler 0  . Chlorphen-PE-Acetaminophen (NOREL AD) 4-10-325 MG TABS Take 1 tablet by mouth 2 (two) times daily as needed.    . clonazePAM (KLONOPIN) 1 MG tablet Take 0.5-1 mg by mouth 2 (two) times daily as needed for anxiety.    [name] estradiol (ESTRACE) 0.5 MG tablet Take 0.5 mg by mouth daily.    . ferrous sulfate 325 (65 FE) MG tablet Take 1 tablet (325 mg total) by mouth 2 (two) times daily with a meal. (Patient not taking: Reported on 05/01/2015) 60 tablet 0  . predniSONE (DELTASONE) 20 MG tablet Take 2 tablets (40 mg total) by mouth daily with breakfast. (Patient not taking: Reported on 05/01/2015) 10 tablet 0  . pyridostigmine (MESTINON) 60 MG tablet Take 1 tablet (60 mg total) by mouth every 8 (eight) hours. Generic OK 90 tablet 0  . sertraline (ZOLOFT) 100 MG tablet Take 50 mg by mouth every morning.     . Vitamin D, Ergocalciferol,  (DRISDOL) 50000 units CAPS capsule Take 1 capsule (50,000 Units total) by mouth every 7 (seven) days. 4 capsule 1   No current facility-administered medications for this visit.    REVIEW OF SYSTEMS:    10 Point review of Systems was done is negative except as noted above.  PHYSICAL EXAMINATION: ECOG PERFORMANCE STATUS: 2 - Symptomatic, <50% confined to bed  . Filed Vitals:   05/03/15 1338  BP: 149/80  Pulse: 95  Temp: 98 F (36.7 C)  Resp: 19   Filed Weights   05/03/15 1338  Weight: 270 lb (122.471 kg)   .Body mass index is 52.73 kg/(m^2).  GENERAL:alert, in no acute distress and comfortable SKIN: skin color,  texture, turgor are normal, no rashes or significant lesions EYES: normal, conjunctiva are pink and non-injected, sclera clear OROPHARYNX:no exudate, no erythema and lips, buccal mucosa, and tongue normal  NECK: supple, no JVD, thyroid normal size, non-tender, without nodularity LYMPH:  no palpable lymphadenopathy in the cervical, axillary or inguinal LUNGS: clear to auscultation with normal respiratory effort HEART: regular rate & rhythm,  no murmurs and no lower extremity edema ABDOMEN: abdomen soft, non-tender, normoactive bowel sounds  Musculoskeletal: no cyanosis of digits and no clubbing  PSYCH: alert & oriented x 3 with fluent speech NEURO: no focal motor/sensory deficits  LABORATORY DATA:  I have reviewed the data as listed  . CBC Latest Ref Rng 05/03/2015 05/01/2015 04/11/2015  WBC 3.9 - 10.3 10e3/uL 6.3 5.2 4.3  Hemoglobin 11.6 - 15.9 g/dL 11.3(L) 11.0(L) 8.6(L)  Hematocrit 34.8 - 46.6 % 36.6 35.1(L) 28.9(L)  Platelets 145 - 400 10e3/uL 344 373 299   . Lab Results  Component Value Date   RETICCTPCT 2.21* 05/03/2015   RBC 4.38 05/03/2015   RETICCTABS 96.80* 05/03/2015    . CBC    Component Value Date/Time   WBC 6.3 05/03/2015 1459   WBC 5.2 05/01/2015 0858   RBC 4.38 05/03/2015 1459   RBC 4.27 05/01/2015 0858   HGB 11.3* 05/03/2015 1459    HGB 11.0* 05/01/2015 0858   HCT 36.6 05/03/2015 1459   HCT 35.1* 05/01/2015 0858   PLT 344 05/03/2015 1459   PLT 373 05/01/2015 0858   MCV 83.6 05/03/2015 1459   MCV 82.2 05/01/2015 0858   MCH 25.8 05/03/2015 1459   MCH 25.8* 05/01/2015 0858   MCHC 30.9* 05/03/2015 1459   MCHC 31.3 05/01/2015 0858   RDW 18.3* 05/03/2015 1459   RDW 19.1* 05/01/2015 0858   LYMPHSABS 1.4 05/03/2015 1459   LYMPHSABS 1.2 05/01/2015 0858   MONOABS 0.4 05/03/2015 1459   MONOABS 0.4 05/01/2015 0858   EOSABS 0.0 05/03/2015 1459   EOSABS 0.0 05/01/2015 0858   BASOSABS 0.0 05/03/2015 1459   BASOSABS 0.0 05/01/2015 0858     . CMP Latest Ref Rng 05/03/2015 04/12/2015 04/11/2015  Glucose 70 - 140 mg/dl 91 98 100(H)  BUN 7.0 - 26.0 mg/dL 6.9(L) 5(L) 6  Creatinine 0.6 - 1.1 mg/dL 0.8 0.55 0.57  Sodium 136 - 145 mEq/L 141 140 140  Potassium 3.5 - 5.1 mEq/L 3.8 3.5 3.4(L)  Chloride 101 - 111 mmol/L - 103 101  CO2 22 - 29 mEq/L 25 27 32  Calcium 8.4 - 10.4 mg/dL 9.5 8.7(L) 8.6(L)  Total Protein 6.4 - 8.3 g/dL 8.2 - -  Total Bilirubin 0.20 - 1.20 mg/dL 0.53 - -  Alkaline Phos 40 - 150 U/L 87 - -  AST 5 - 34 U/L 31 - -  ALT 0 - 55 U/L 27 - -   Component     Latest Ref Rng 04/08/2015 05/01/2015 05/03/2015  Iron     28 - 170 ug/dL 76    TIBC     250 - 450 ug/dL 286    Saturation Ratios     10.4 - 31.8 % 27    UIBC      210    Vitamin B12     180 - 914 pg/mL 353    Folate     >5.9 ng/mL 7.6    Ferritin     11 - 307 ng/mL 688 (H)    TSH       2.71   HIV  NONREACTIVE  NONREACTIVE   Vitamin D, 25-Hydroxy     30 - 100 ng/mL  12 (L)   LDH     125 - 245 U/L   395 (H)  Sed Rate     0 - 40 mm/hr   45 (H)  Haptoglobin     34 - 200 mg/dL   <10 (L)    RADIOGRAPHIC STUDIES: I have personally reviewed the radiological images as listed and agreed with the findings in the report. Dg Chest 2 View  04/07/2015  CLINICAL DATA:  Pt c/o left-sided chest pain, SOB, cough, and fever x 3 days. Hx of HTN and GERD. Pt  is a former smoker. EXAM: CHEST - 2 VIEW COMPARISON:  04/05/2015 FINDINGS: Low lung volumes. Linear subsegmental atelectasis in the lung bases as before. No overt edema. Heart size normal. No pneumothorax. No effusion. Spurring in the mid thoracic spine. IMPRESSION: Low volumes with bibasilar atelectasis.  No acute disease. Electronically Signed   By: Lucrezia Europe M.D.   On: 04/07/2015 12:01   Dg Chest 2 View  04/05/2015  CLINICAL DATA:  55 year old female with dizziness for several days, increasing. Cough and weakness. Initial encounter. EXAM: CHEST  2 VIEW COMPARISON:  Chest CT 03/22/2015 and earlier. FINDINGS: Large body habitus. Low lung volumes. Bibasilar atelectasis. Stable cardiac size and mediastinal contours. Visualized tracheal air column is within normal limits. No pneumothorax or pulmonary edema. No pleural effusion. No acute osseous abnormality identified. IMPRESSION: Very low lung volumes with bibasilar atelectasis. Electronically Signed   By: Genevie Ann M.D.   On: 04/05/2015 11:35   Ct Angio Chest Pe W/cm &/or Wo Cm  04/07/2015  CLINICAL DATA:  SHOB AND SOME LEFT SIDED CHEST PAIN SINCE THIS AM 0500 RASH TO BACK AND ARM WITH RECENT ANTIBIOTIC STARTED Thursday PMH: HTN, GERD (gastroesophageal reflux disease); Tumors; Knee injury; Depression EXAM: CT ANGIOGRAPHY CHEST WITH CONTRAST TECHNIQUE: Multidetector CT imaging of the chest was performed using the standard protocol during bolus administration of intravenous contrast. Multiplanar CT image reconstructions and MIPs were obtained to evaluate the vascular anatomy. CONTRAST:  61mL OMNIPAQUE IOHEXOL 350 MG/ML SOLN COMPARISON:  03/22/2015 FINDINGS: Right arm IV contrast injection. The SVC is patent. Right atrium nondilated. RV is nondilated. Satisfactory opacification of pulmonary arteries noted, and there is no evidence of pulmonary emboli. Patent pulmonary veins. Adequate contrast opacification of the thoracic aorta with no evidence of dissection,  aneurysm, or stenosis. There is classic 3-vessel brachiocephalic arch anatomy without proximal stenosis. No pleural or pericardial effusion. Prominent axillary, left cervical level 4 and supraclavicular lymph nodes, new since prior study. Subsegmental atelectasis in basilar segments of both lower lobes. Flowing osteophytes across several contiguous levels in the lower thoracic spine. Sternum intact. Borderline splenomegaly suspected, new. Remainder visualized portions of upper abdomen unremarkable. Review of the MIP images confirms the above findings. IMPRESSION: 1. Negative for acute PE or thoracic aortic dissection. 2. Progressive splenomegaly as well as prominence of bilateral axillary, left subclavian and cervical lymph nodes, possibly reactive but nonspecific. Follow-up suggested to exclude neoplasm or lymphoproliferative process. Electronically Signed   By: Lucrezia Europe M.D.   On: 04/07/2015 14:39    ASSESSMENT & PLAN:   55 year old African American female with newly diagnosed myasthenia gravis  #1 lymphadenopathy- bilateral axillary, left subclavian and cervical lymph nodes + splenomegaly -  HIV negative, infectious mono screen negative. Elevated LDH- nearly 400 is concerning.  Would need to rule out lymphoma which could be associated with her recently  diagnosed  of myasthenia gravis. ( although thymomas are typically associated with Myasthenia gravis - certain non-Hodgkin's lymphoma have also been shown to be associated)  If workup rules out a lymphoproliferative disorder, would need to consider the possibility of an autoimmune condition that the myasthenia gravis might be associated with.  Plan -now that the patient has been off prednisone for 3 weeks it is reasonable to repeat whole body scan with a PET/CT scan to evaluate the patient's concerning  lymphadenopathy and splenomegaly in the setting of elevated LDH levels and her recent diagnosis of myasthenia gravis. -Patient would likely need  an excisional lymph node biopsy or a core needle lymph node biopsy of one of the prominent lymph nodes. - Autoimmune work up as per PCP  #2 elevated LDH level would be related to her lymphadenopathy, however low haptoglobin level suggests it could partly be related to hemolysis.  Low level Hemolysis could certainly be from having received IVIG.  #3 normocytic normochromic anemia. Hemoglobin has improved from 8.6 on 04/11/2015 to 11.3 in clinic today. Noted to have elevated LDH, low haptoglobin, increased reticulocyte count, spherocytes on peripheral blood.  Overall picture could be consistent with hemolysis from IVIG with recovery at this time. Given improving hemoglobin unlikely to be a primary autoimmune hemolytic anemia though this can sometimes be associated with hematologic phenomenon such as myasthenia gravis. Plan -We'll repeat hemolytic markers and CBC and Coombs' test on followup in 2 weeks.  #4 vitamin D deficiency -on high-dose ergocalciferol replacement as per primary care physician.   All of the patients questions were answered in details to her apparent satisfaction. The patient knows to call the clinic with any problems, questions or concerns.  I spent 60 minutes counseling the patient face to face. The total time spent in the appointment was 65 minutes and more than 50% was on counseling and direct patient cares.    Sullivan Lone MD [place] AAHIVMS [facility] Hematology/Oncology Physician [facility]  (Office):       [phone] (Work cell):  [phone] (Fax):           [phone]  05/03/2015 1:51 PM

## 2015-05-03 NOTE — Telephone Encounter (Signed)
per pof to sch pt appt-gave pt copy of avs °

## 2015-05-04 ENCOUNTER — Telehealth: Payer: Self-pay | Admitting: *Deleted

## 2015-05-04 LAB — SEDIMENTATION RATE: Sedimentation Rate-Westergren: 45 mm/hr — ABNORMAL HIGH (ref 0–40)

## 2015-05-04 LAB — HAPTOGLOBIN: Haptoglobin: <10 mg/dL — ABNORMAL LOW (ref 34–200)

## 2015-05-04 NOTE — Telephone Encounter (Signed)
Eustaquio Maize, RN with [facility] called with questions regarding patient's medications.  The prednisone and the iron tablets.  Per Beth, patient did not start these medications after discharge.  Per Dr. Gwendlyn Deutscher patient should follow up with her neurologist for the prednisone.  The iron tablet patient can get over the counter.  Hemoglobin increased from 8 to 11, patient will taking iron supplement to maintain the iron level.  Patient should also purchase something to help with constipation.  Derl Barrow, RN `````````````````

## 2015-05-06 DIAGNOSIS — R7402 Elevation of levels of lactic acid dehydrogenase (LDH): Secondary | ICD-10-CM

## 2015-05-06 DIAGNOSIS — R740 Nonspecific elevation of levels of transaminase and lactic acid dehydrogenase [LDH]: Secondary | ICD-10-CM

## 2015-05-06 HISTORY — DX: Elevation of levels of lactic acid dehydrogenase (LDH): R74.02

## 2015-05-08 ENCOUNTER — Telehealth: Payer: Self-pay | Admitting: Family Medicine

## 2015-05-08 NOTE — Telephone Encounter (Signed)
Will call in Ativan thanks.

## 2015-05-08 NOTE — Telephone Encounter (Signed)
Spoke with patient and she states that she still has klonopin but it doesn't help her when she is getting a scan.  States that when she was in the hospital they had to give her something stronger.  I informed her that I would tell pcp and then would call her back. Jazmin Hartsell,CMA

## 2015-05-08 NOTE — Telephone Encounter (Signed)
Mrs. Frances Medina have an appt for her CT scan and need something for her nerves.  She is claustrophobic.  Please call when something is ready.

## 2015-05-08 NOTE — Telephone Encounter (Signed)
She does have klonopin, she can use this 30 min prior to procedure. She will need someone to drive her to the procedure.

## 2015-05-09 ENCOUNTER — Telehealth: Payer: Self-pay | Admitting: Family Medicine

## 2015-05-09 MED ORDER — LORAZEPAM 2 MG PO TABS: 2.0000 mg | ORAL_TABLET | Freq: Once | ORAL | Status: DC | PRN

## 2015-05-09 NOTE — Telephone Encounter (Signed)
PT informed that this was done and that pharmacy should receive it sometime today. [name], April D, [place]

## 2015-05-09 NOTE — Telephone Encounter (Signed)
Script printed and placed up front for faxing. (Ativan 2mg )

## 2015-05-09 NOTE — Telephone Encounter (Signed)
Frances Feil, MD at 05/08/2015 5:17 PM     Status: Signed       Expand All Collapse All   Will call in Ativan thanks.            Valerie Roys, CMA at 05/08/2015 4:10 PM     Status: Signed       Expand All Collapse All   Spoke with patient and she states that she still has klonopin but it doesn't help her when she is getting a scan. States that when she was in the hospital they had to give her something stronger. I informed her that I would tell pcp and then would call her back. Jazmin Hartsell,CMA             Frances Feil, MD at 05/08/2015 3:41 PM     Status: Signed       Expand All Collapse All   She does have klonopin, she can use this 30 min prior to procedure. She will need someone to drive her to the procedure.            Eusebio Friendly at 05/08/2015 3:19 PM     Status: Signed       Expand All Collapse All   Frances Medina have an appt for her CT scan and need something for her nerves. She is claustrophobic. Please call when something is ready.

## 2015-05-09 NOTE — Addendum Note (Signed)
Addended by: Andrena Mews T on: 05/09/2015 07:14 AM   Modules accepted: Orders

## 2015-05-09 NOTE — Telephone Encounter (Signed)
Ativan script printed and placed up front for faxing.

## 2015-05-14 ENCOUNTER — Encounter (HOSPITAL_COMMUNITY)
Admission: RE | Admit: 2015-05-14 | Discharge: 2015-05-14 | Disposition: A | Discharge status: Home / Self Care | Payer: BC Managed Care – PPO | Source: Ambulatory Visit | Attending: Hematology | Admitting: Hematology

## 2015-05-14 DIAGNOSIS — R161 Splenomegaly, not elsewhere classified: Secondary | ICD-10-CM | POA: Insufficient documentation

## 2015-05-14 DIAGNOSIS — R591 Generalized enlarged lymph nodes: Secondary | ICD-10-CM | POA: Insufficient documentation

## 2015-05-14 LAB — GLUCOSE, CAPILLARY: Glucose-Capillary: 97 mg/dL (ref 65–99)

## 2015-05-14 MED ORDER — FLUDEOXYGLUCOSE F - 18 (FDG) INJECTION
14.7700 | Freq: Once | INTRAVENOUS | Status: AC | PRN
Start: 2015-05-14 — End: 2015-05-14
  Administered 2015-05-14: 14.77 via INTRAVENOUS

## 2015-05-15 ENCOUNTER — Other Ambulatory Visit: Payer: Self-pay

## 2015-05-15 ENCOUNTER — Encounter (HOSPITAL_COMMUNITY): Payer: Self-pay

## 2015-05-15 ENCOUNTER — Emergency Department (HOSPITAL_COMMUNITY): Payer: BC Managed Care – PPO

## 2015-05-15 ENCOUNTER — Inpatient Hospital Stay (HOSPITAL_COMMUNITY)
Admission: EM | Admit: 2015-05-15 | Discharge: 2015-06-08 | DRG: 004 | Disposition: A | Discharge status: Home Health | Payer: BC Managed Care – PPO | Attending: Family Medicine | Admitting: Family Medicine

## 2015-05-15 DIAGNOSIS — H02403 Unspecified ptosis of bilateral eyelids: Secondary | ICD-10-CM | POA: Diagnosis present

## 2015-05-15 DIAGNOSIS — N390 Urinary tract infection, site not specified: Secondary | ICD-10-CM | POA: Diagnosis not present

## 2015-05-15 DIAGNOSIS — R0602 Shortness of breath: Secondary | ICD-10-CM | POA: Insufficient documentation

## 2015-05-15 DIAGNOSIS — D649 Anemia, unspecified: Secondary | ICD-10-CM | POA: Diagnosis present

## 2015-05-15 DIAGNOSIS — Z930 Tracheostomy status: Secondary | ICD-10-CM | POA: Diagnosis not present

## 2015-05-15 DIAGNOSIS — G43909 Migraine, unspecified, not intractable, without status migrainosus: Secondary | ICD-10-CM | POA: Insufficient documentation

## 2015-05-15 DIAGNOSIS — Y848 Other medical procedures as the cause of abnormal reaction of the patient, or of later complication, without mention of misadventure at the time of the procedure: Secondary | ICD-10-CM | POA: Diagnosis not present

## 2015-05-15 DIAGNOSIS — I2699 Other pulmonary embolism without acute cor pulmonale: Secondary | ICD-10-CM

## 2015-05-15 DIAGNOSIS — A047 Enterocolitis due to Clostridium difficile: Secondary | ICD-10-CM | POA: Diagnosis not present

## 2015-05-15 DIAGNOSIS — R1312 Dysphagia, oropharyngeal phase: Secondary | ICD-10-CM | POA: Diagnosis present

## 2015-05-15 DIAGNOSIS — Z87891 Personal history of nicotine dependence: Secondary | ICD-10-CM | POA: Diagnosis not present

## 2015-05-15 DIAGNOSIS — J95851 Ventilator associated pneumonia: Secondary | ICD-10-CM | POA: Insufficient documentation

## 2015-05-15 DIAGNOSIS — G7001 Myasthenia gravis with (acute) exacerbation: Secondary | ICD-10-CM | POA: Diagnosis present

## 2015-05-15 DIAGNOSIS — Z789 Other specified health status: Secondary | ICD-10-CM | POA: Insufficient documentation

## 2015-05-15 DIAGNOSIS — Z880 Allergy status to penicillin: Secondary | ICD-10-CM

## 2015-05-15 DIAGNOSIS — R0682 Tachypnea, not elsewhere classified: Secondary | ICD-10-CM | POA: Insufficient documentation

## 2015-05-15 DIAGNOSIS — K219 Gastro-esophageal reflux disease without esophagitis: Secondary | ICD-10-CM | POA: Diagnosis not present

## 2015-05-15 DIAGNOSIS — I10 Essential (primary) hypertension: Secondary | ICD-10-CM | POA: Insufficient documentation

## 2015-05-15 DIAGNOSIS — H532 Diplopia: Secondary | ICD-10-CM

## 2015-05-15 DIAGNOSIS — Z978 Presence of other specified devices: Secondary | ICD-10-CM | POA: Insufficient documentation

## 2015-05-15 DIAGNOSIS — Z881 Allergy status to other antibiotic agents status: Secondary | ICD-10-CM | POA: Diagnosis not present

## 2015-05-15 DIAGNOSIS — G7000 Myasthenia gravis without (acute) exacerbation: Secondary | ICD-10-CM | POA: Diagnosis present

## 2015-05-15 DIAGNOSIS — Z91013 Allergy to seafood: Secondary | ICD-10-CM | POA: Diagnosis not present

## 2015-05-15 DIAGNOSIS — R739 Hyperglycemia, unspecified: Secondary | ICD-10-CM | POA: Diagnosis present

## 2015-05-15 DIAGNOSIS — D72829 Elevated white blood cell count, unspecified: Secondary | ICD-10-CM | POA: Insufficient documentation

## 2015-05-15 DIAGNOSIS — R0981 Nasal congestion: Secondary | ICD-10-CM | POA: Diagnosis present

## 2015-05-15 DIAGNOSIS — R1319 Other dysphagia: Secondary | ICD-10-CM | POA: Insufficient documentation

## 2015-05-15 DIAGNOSIS — E876 Hypokalemia: Secondary | ICD-10-CM | POA: Diagnosis not present

## 2015-05-15 DIAGNOSIS — R1310 Dysphagia, unspecified: Secondary | ICD-10-CM

## 2015-05-15 DIAGNOSIS — E870 Hyperosmolality and hypernatremia: Secondary | ICD-10-CM | POA: Diagnosis present

## 2015-05-15 DIAGNOSIS — R531 Weakness: Secondary | ICD-10-CM | POA: Diagnosis present

## 2015-05-15 DIAGNOSIS — R591 Generalized enlarged lymph nodes: Secondary | ICD-10-CM | POA: Diagnosis present

## 2015-05-15 DIAGNOSIS — J9601 Acute respiratory failure with hypoxia: Secondary | ICD-10-CM | POA: Diagnosis not present

## 2015-05-15 DIAGNOSIS — I959 Hypotension, unspecified: Secondary | ICD-10-CM | POA: Diagnosis present

## 2015-05-15 DIAGNOSIS — F329 Major depressive disorder, single episode, unspecified: Secondary | ICD-10-CM | POA: Insufficient documentation

## 2015-05-15 DIAGNOSIS — Z6841 Body Mass Index (BMI) 40.0 and over, adult: Secondary | ICD-10-CM | POA: Diagnosis not present

## 2015-05-15 DIAGNOSIS — J9620 Acute and chronic respiratory failure, unspecified whether with hypoxia or hypercapnia: Secondary | ICD-10-CM | POA: Diagnosis not present

## 2015-05-15 DIAGNOSIS — Z888 Allergy status to other drugs, medicaments and biological substances status: Secondary | ICD-10-CM | POA: Diagnosis not present

## 2015-05-15 DIAGNOSIS — D6189 Other specified aplastic anemias and other bone marrow failure syndromes: Secondary | ICD-10-CM | POA: Diagnosis not present

## 2015-05-15 DIAGNOSIS — I5032 Chronic diastolic (congestive) heart failure: Secondary | ICD-10-CM | POA: Diagnosis present

## 2015-05-15 DIAGNOSIS — A0472 Enterocolitis due to Clostridium difficile, not specified as recurrent: Secondary | ICD-10-CM | POA: Insufficient documentation

## 2015-05-15 DIAGNOSIS — R740 Nonspecific elevation of levels of transaminase and lactic acid dehydrogenase [LDH]: Secondary | ICD-10-CM | POA: Diagnosis present

## 2015-05-15 DIAGNOSIS — R161 Splenomegaly, not elsewhere classified: Secondary | ICD-10-CM | POA: Diagnosis present

## 2015-05-15 DIAGNOSIS — F458 Other somatoform disorders: Secondary | ICD-10-CM

## 2015-05-15 DIAGNOSIS — F419 Anxiety disorder, unspecified: Secondary | ICD-10-CM | POA: Diagnosis present

## 2015-05-15 DIAGNOSIS — Z79899 Other long term (current) drug therapy: Secondary | ICD-10-CM | POA: Diagnosis not present

## 2015-05-15 DIAGNOSIS — Z452 Encounter for adjustment and management of vascular access device: Secondary | ICD-10-CM

## 2015-05-15 DIAGNOSIS — R0600 Dyspnea, unspecified: Secondary | ICD-10-CM | POA: Diagnosis not present

## 2015-05-15 DIAGNOSIS — J9600 Acute respiratory failure, unspecified whether with hypoxia or hypercapnia: Secondary | ICD-10-CM | POA: Diagnosis not present

## 2015-05-15 DIAGNOSIS — B9620 Unspecified Escherichia coli [E. coli] as the cause of diseases classified elsewhere: Secondary | ICD-10-CM | POA: Diagnosis not present

## 2015-05-15 DIAGNOSIS — J9690 Respiratory failure, unspecified, unspecified whether with hypoxia or hypercapnia: Secondary | ICD-10-CM

## 2015-05-15 DIAGNOSIS — Z4659 Encounter for fitting and adjustment of other gastrointestinal appliance and device: Secondary | ICD-10-CM

## 2015-05-15 DIAGNOSIS — I110 Hypertensive heart disease with heart failure: Secondary | ICD-10-CM | POA: Diagnosis present

## 2015-05-15 HISTORY — DX: Myasthenia gravis with (acute) exacerbation: G70.01

## 2015-05-15 LAB — CBC WITH DIFFERENTIAL/PLATELET
Basophils Absolute: 0 10*3/uL (ref 0.0–0.1)
Basophils Relative: 0 %
Eosinophils Absolute: 0 10*3/uL (ref 0.0–0.7)
Eosinophils Relative: 0 %
HCT: 38.5 % (ref 36.0–46.0)
Hemoglobin: 11.7 g/dL — ABNORMAL LOW (ref 12.0–15.0)
Lymphocytes Relative: 15 %
Lymphs Abs: 1.5 10*3/uL (ref 0.7–4.0)
MCH: 25.6 pg — ABNORMAL LOW (ref 26.0–34.0)
MCHC: 30.4 g/dL (ref 30.0–36.0)
MCV: 84.2 fL (ref 78.0–100.0)
Monocytes Absolute: 0.3 10*3/uL (ref 0.1–1.0)
Monocytes Relative: 3 %
Neutro Abs: 8.1 10*3/uL — ABNORMAL HIGH (ref 1.7–7.7)
Neutrophils Relative %: 82 %
Platelets: 382 10*3/uL (ref 150–400)
RBC: 4.57 MIL/uL (ref 3.87–5.11)
RDW: 16.3 % — ABNORMAL HIGH (ref 11.5–15.5)
WBC: 10 10*3/uL (ref 4.0–10.5)

## 2015-05-15 LAB — URINALYSIS, ROUTINE W REFLEX MICROSCOPIC
Bilirubin Urine: NEGATIVE
Glucose, UA: NEGATIVE mg/dL
Hgb urine dipstick: NEGATIVE
Ketones, ur: NEGATIVE mg/dL
Nitrite: POSITIVE — AB
Protein, ur: NEGATIVE mg/dL
Specific Gravity, Urine: 1.02 (ref 1.005–1.030)
pH: 6 (ref 5.0–8.0)

## 2015-05-15 LAB — URINE MICROSCOPIC-ADD ON

## 2015-05-15 LAB — COMPREHENSIVE METABOLIC PANEL
ALT: 20 U/L (ref 14–54)
AST: 29 U/L (ref 15–41)
Albumin: 3.6 g/dL (ref 3.5–5.0)
Alkaline Phosphatase: 81 U/L (ref 38–126)
Anion gap: 11 (ref 5–15)
BUN: 7 mg/dL (ref 6–20)
CO2: 27 mmol/L (ref 22–32)
Calcium: 9.4 mg/dL (ref 8.9–10.3)
Chloride: 104 mmol/L (ref 101–111)
Creatinine, Ser: 0.69 mg/dL (ref 0.44–1.00)
GFR calc Af Amer: >60 mL/min (ref >60)
GFR calc non Af Amer: >60 mL/min (ref >60)
Glucose, Bld: 107 mg/dL — ABNORMAL HIGH (ref 65–99)
Potassium: 3.6 mmol/L (ref 3.5–5.1)
Sodium: 142 mmol/L (ref 135–145)
Total Bilirubin: 0.5 mg/dL (ref 0.3–1.2)
Total Protein: 7.5 g/dL (ref 6.5–8.1)

## 2015-05-15 LAB — LIPASE, BLOOD: Lipase: 36 U/L (ref 11–51)

## 2015-05-15 MED ORDER — METHYLPREDNISOLONE SODIUM SUCC 125 MG IJ SOLR
125.0000 mg | Freq: Once | INTRAMUSCULAR | Status: AC
  Administered 2015-05-15: 125 mg via INTRAVENOUS
  Filled 2015-05-15: qty 2

## 2015-05-15 MED ORDER — ENOXAPARIN SODIUM 60 MG/0.6ML ~~LOC~~ SOLN
60.0000 mg | SUBCUTANEOUS | Status: DC
  Administered 2015-05-16 – 2015-05-22 (×7): 60 mg via SUBCUTANEOUS
  Filled 2015-05-15 (×11): qty 0.6

## 2015-05-15 MED ORDER — PANTOPRAZOLE SODIUM 40 MG IV SOLR
40.0000 mg | INTRAVENOUS | Status: DC
  Administered 2015-05-16 – 2015-05-21 (×7): 40 mg via INTRAVENOUS
  Filled 2015-05-15 (×7): qty 40

## 2015-05-15 MED ORDER — DEXTROSE-NACL 5-0.45 % IV SOLN
INTRAVENOUS | Status: DC
  Administered 2015-05-16 – 2015-05-20 (×6): via INTRAVENOUS

## 2015-05-15 MED ORDER — METHYLPREDNISOLONE SODIUM SUCC 125 MG IJ SOLR: 125.0000 mg | Freq: Every day | INTRAMUSCULAR | Status: DC

## 2015-05-15 MED ORDER — ALBUTEROL SULFATE HFA 108 (90 BASE) MCG/ACT IN AERS: 2.0000 | INHALATION_SPRAY | RESPIRATORY_TRACT | Status: DC | PRN

## 2015-05-15 MED ORDER — INSULIN ASPART 100 UNIT/ML ~~LOC~~ SOLN
0.0000 [IU] | SUBCUTANEOUS | Status: DC
  Administered 2015-05-16: 1 [IU] via SUBCUTANEOUS
  Administered 2015-05-16 – 2015-05-17 (×3): 2 [IU] via SUBCUTANEOUS
  Administered 2015-05-17 – 2015-05-19 (×4): 1 [IU] via SUBCUTANEOUS
  Administered 2015-05-19 (×2): 2 [IU] via SUBCUTANEOUS
  Administered 2015-05-19: 1 [IU] via SUBCUTANEOUS
  Administered 2015-05-19 – 2015-05-21 (×10): 2 [IU] via SUBCUTANEOUS
  Administered 2015-05-21: 1 [IU] via SUBCUTANEOUS
  Administered 2015-05-22: 2 [IU] via SUBCUTANEOUS
  Administered 2015-05-23 (×2): 1 [IU] via SUBCUTANEOUS
  Administered 2015-05-23 (×2): 2 [IU] via SUBCUTANEOUS
  Administered 2015-05-23 – 2015-05-25 (×4): 1 [IU] via SUBCUTANEOUS
  Filled 2015-05-15: qty 1

## 2015-05-15 MED ORDER — SODIUM CHLORIDE 0.9% FLUSH
3.0000 mL | Freq: Two times a day (BID) | INTRAVENOUS | Status: DC
  Administered 2015-05-16 – 2015-06-01 (×21): 3 mL via INTRAVENOUS

## 2015-05-15 NOTE — ED Notes (Signed)
Pt here for SOB, dizziness and trouble swallowing. She has Myasthenia gravis. Denies pain.

## 2015-05-15 NOTE — ED Notes (Signed)
This RN spoke with Dr. Jeanell Sparrow regarding patient and Dr. Jeanell Sparrow states to bring patient to next available room.

## 2015-05-15 NOTE — ED Notes (Signed)
Pt c/o difficulty swallowing, "feels like it's getting stuck". Hx of Myasthenia Gravis. Pt reports shortness of breath, worsening with laying flat.

## 2015-05-15 NOTE — ED Notes (Signed)
Pt sitting in triage room and spitting into emesis basin. When speaking pt speaks in short sentences.

## 2015-05-15 NOTE — H&P (Signed)
[facility] Admission History and Physical Service Pager: [phone]  Patient name: Frances Medina Medical record number: [id number] Date of birth: 07/26/1960 Age: 55 y.o. Gender: female  Primary Care Provider: Andrena Mews, MD Consultants: Neurology Code Status: Full  Chief Complaint: Weakness, dysphagia, SOB in setting of known MG  Assessment and Plan: Frances Medina is a 55 y.o. female presenting with myasthenia gravis exacerbation. PMH is significant for HTN,uterine fibroids, depression, anxiety, migraines and recently diagnosed myasthenia gravis, and undergoing work-up of splenomegaly with lymphadenopathy.    Myasthenia Gravis Exacerbation: Triggered by failure to start steroids on recent discharge. Will need close attention to care coordination. Possibly also infectious precipitant. Rhinorrhea but no fevers or leukocytosis. Discontinuation of steroids most likely cause. Denies urinary complaints, however, UA was positive for nitrites and trace leukocytes.  - Admit to SDU, attending Dr. McDiarmid, on continuous cardiopulmonary monitoring - Neurology consulting, appreciate recommendations: s/p IV solumedrol 125 mg in the ED; a.m. team to decide whether to continue IV solumedrol vs. plx or IVIG - TSH, Sed rate, SPEP ordered, per neuro - q2h neuro checks - NIFs/VCs q4h - NPO until evaluated by SLP - Holding home pyridostigmine - SSI and q4h CBGs while on steroids - Protonix 40 mg IV daily for gastric protection while on steroids and NPO - Low threshold for intubation for worsening SOB: Code status discussed in detail at admission with husband present. - Follows with Neurologist Dr. Audelia Acton of [facility]; notify of hospitalization in the a.m. - PT/OT consulted - Continue home prn albuterol for wheezing/SOB - Prn oxygen as needed to maintain O2 saturations >/= 92% - Drugs to avoid in MG: aminoglycosides, clindamycin, fluoroquinolones, ketolides,  vancomycin, beta blockers, procainamide, quinidine, anti-PD-1 monoclonal antibodies, botox, chloroquine, hydroxychloroquine, magnesium, penicillamine, quinine - Consider treatment of possible UTI, being mindful of abx choice  HFpEF: ECHO 04/09/15 performed due to SOB showed EF of 60-65% and G1DD. Patient currently appears euvolemic. - Monitor fluid status and administer MIVFs while NPO.   Splenomegaly w/Lymphadenopathy: Being followed by Oncologist Dr. Irene Limbo. Work-up thus far has shown HIV neg, mono screen neg, elevated LDH at 395 and low haptoglobin at <10 (with recent IVIG treatment). To repeat hemolytic markers, CBC and Coombs' test later this month at follow-up. No hypermetabolic masses or lymphadenopathy seen on PET scan 05/14/15. - Continue to monitor CBCs - Consider pursuing autoimmune work-up while hospitalized   History of HTN: 119/79-149/95. - Not on any home anti-hypertensives - Continue to monitor. Will add prn if needed.   Depression and Anxiety: - Hold zoloft while NPO - Hold klonopin and ativan (ordered for PET scan yesterday) to avoid sedation  FEN/GI: NPO until swallow eval, MIVFs (D5 1/2NS) while NPO, protonix Prophylaxis: Lovenox  Disposition: Admitted to SDU for close monitoring of respiratory function while undergoing treatment for MG exacerbation  History of Present Illness:  Frances Medina is a 54 y.o. female presenting with increased weakness, SOB, and dysphagia since the end of last week. Of note, she was recently diagnosed with myasthenia gravis during hospitalization in January 2017, during which time she required treatment with IVIG and was started on pyridostigmine. She had another hospitalization for MG flare in February, also requiring treatment with IVIG and steroid taper, thought to have been triggered by a UTI. She was supposed to have continued prednisone upon last hospital discharge 04/13/15, but patient was not aware that she had a prescription. She called her  neurologist Dr. Audelia Acton last Friday about her dysphagia,  and he recommended increasing frequency of pyridostigmine 60 mg to q6h from q8h. He also noted that patient would need to start steroids or be seen in the ED. Patient had an appointment to see him 05/16/15 with plans to start prednisone, but with increasing fatigue, she decided to be evaluated in the ED.  She noticed worsening dyspnea when bending over to put on her shoes yesterday. She notes that being on her side, versus lying on her back, improves breathing. She reports feeling weak and that "anything" can cause her to feel weak. However, she denies dropping any objects or any falls. She has been feeling dizzy since last week when she stands. Yesterday, she had some blurriness of her vision and felt dizzy when standing to get out of her car. Swallowing has become more difficult since last Friday, 05/11/15. She was only able to eat "a few nuggets" today. She was, however, able to take all but her evening dose of pyridostigmine. She has been able to drink fluids but not as much as usual. She and her husband report that she has trouble with her speech if she talks too much. She also complains of chest pain under her breast that feels like "it's swollen" and cuts off her breath since yesterday. She has had rhinorrhea with post-nasal drip and cough in the middle of the night since last weak but has not taken any new OTC medications. No sick contacts.   In the ED, patient's vital signs were stable, and SpO2 was 99% on room air. RR 18-20. CBC with diff showed mildly elevated Abs Neutrophil count of 8.1. Anemia at baseline with hgb 11.7. Lipase WNL. CMP showed glucose of 107. UA positive for nitrites and trace leukocytes. EKG significant only for prolonged QTc. Neurology was consulted and ordered a dose of IV solumedrol, which was administered.   Review Of Systems: Per HPI with the following additions: No n/v/d. No fevers. No dysuria.  Otherwise the remainder  of the systems were negative.  Patient Active Problem List   Diagnosis Date Noted  . Myasthenia gravis with acute exacerbation ([place]) 05/15/2015  . Elevated LDH 05/06/2015  . Elevated BP 05/01/2015  . Fatigue 05/01/2015  . Chest wall pain 04/08/2015  . Dyspnea 04/07/2015  . Hypokalemia 04/07/2015  . Anemia 04/07/2015  . Neutropenia ([place]) 04/07/2015  . Lymphadenopathy 04/07/2015  . Splenomegaly 04/07/2015  . Myasthenia gravis ([place])   . Dysphagia   . Inability to swallow 03/20/2015  . Osteoarthritis of left knee 12/02/2013  . Primary localized osteoarthrosis of the knee 12/02/2013  . Benign neoplasm of colon 09/19/2013  . Osteoarthritis of right knee 08/30/2013  . GERD (gastroesophageal reflux disease) 06/23/2013  . Obesity 06/23/2013  . Anxiety and depression 06/23/2013  . Fibroids 06/23/2013    Past Medical History: Past Medical History  Diagnosis Date  . GERD (gastroesophageal reflux disease)   . 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 Knee injury   . Depression   . Seasonal allergies     takes Zytrec  . Fibroid uterus     size of a dime  . Umbilical hernia     watching , no plans for surgery at present  . H/O hiatal hernia   . Hypertension     "went away when I stopped smoking"  . Anxiety   . Migraine     "maybe couple times/month" (03/20/2015)  . Arthritis     "knees" (03/20/2015)  . Osteoarthritis of right knee 08/30/2013  .  Osteoarthritis of left knee 12/02/2013  . Myasthenia gravis ([place]) 2017  . E. coli UTI 04/07/2015     Past Surgical History: Past Surgical History  Procedure Laterality Date  . Cesarean section  1987; 1989  . Dilation and curettage of uterus    . Tubal ligation  1989  . Vaginal hysterectomy  1990's?    "apparently took out one of my ovaries at the time too cause one's missing"  . Partial knee arthroplasty Right 08/30/2013    Procedure: RIGHT UNICOMPARTMENTAL KNEE;  Surgeon: Johnny Bridge, MD;  Location: [place];  Service: Orthopedics;   Laterality: Right;  . Colonoscopy with propofol N/A 09/19/2013    Procedure: COLONOSCOPY WITH PROPOFOL;  Surgeon: Ladene Artist, MD;  Location: [facility];  Service: Endoscopy;  Laterality: N/A;  . Partial knee arthroplasty Left 12/02/2013    Procedure: LEFT KNEE UNI ARTHROPLASTY;  Surgeon: Johnny Bridge, MD;  Location: [place];  Service: Orthopedics;  Laterality: Left;  . Esophagogastroduodenoscopy (egd) with propofol N/A 03/21/2015    Procedure: ESOPHAGOGASTRODUODENOSCOPY (EGD) WITH PROPOFOL;  Surgeon: Gatha Mayer, MD;  Location: [place];  Service: Endoscopy;  Laterality: N/A;    Social History: Social History  Substance Use Topics  . Smoking status: Former Smoker -- 0.50 packs/day for 40 years    Types: Cigarettes    Quit date: 05/14/2012  . Smokeless tobacco: Never Used  . Alcohol Use: No   Additional social history: Lives with husband and 2 daughters at home. Patient reports smoking history of 13 years of ~1PPD but quit 3 years ago.   Please also refer to relevant sections of EMR.   Family History: Family History  Problem Relation Age of Onset  . Heart disease Mother 74  . Hypertension Mother   . Stroke Father   . Hypertension Father   . Colon cancer Neg Hx   . Other Sister     Esophageal strictures  . Anemia Sister    Reports that sisters have "leukemia" -- one with "too many red cells" and the other with "too many white cells" Family hx negative for diabetes. No history of lupus. Youngest sister has IBS.   Allergies and Medications: Allergies  Allergen Reactions  . Dicyclomine Hives  . Methocarbamol Hives  . Penicillins Hives    Has patient had a PCN reaction causing immediate rash, facial/tongue/throat swelling, SOB or lightheadedness with hypotension: Yes Has patient had a PCN reaction causing severe rash involving mucus membranes or skin necrosis: No Has patient had a PCN reaction that required hospitalization No Has patient had a  PCN reaction occurring within the last 10 years: No If all of the above answers are "NO", then may proceed with Cephalosporin use.   . Ciprofloxacin     Rash, shortness of breath  . Shrimp [Shellfish Allergy]    No current facility-administered medications on file prior to encounter.   Current Outpatient Prescriptions on File Prior to Encounter  Medication Sig Dispense Refill  . albuterol (PROVENTIL HFA;VENTOLIN HFA) 108 (90 Base) MCG/ACT inhaler Inhale 2 puffs into the lungs every 4 (four) hours as needed for wheezing or shortness of breath. 1 Inhaler 0  . Chlorphen-PE-Acetaminophen (NOREL AD) 4-10-325 MG TABS Take 1 tablet by mouth 2 (two) times daily as needed.    . clonazePAM (KLONOPIN) 1 MG tablet Take 0.5-1 mg by mouth 2 (two) times daily as needed for anxiety.    [name] estradiol (ESTRACE) 0.5 MG tablet Take 0.5 mg by mouth  daily.    [name] LORazepam (ATIVAN) 2 MG tablet Take 1 tablet (2 mg total) by mouth once as needed for anxiety or sedation (use one tablet 15 -38min before PET scan/CT. Do not use with Klonopin and ensure someone drives you to and from your procedure). 2 tablet 0  . pyridostigmine (MESTINON) 60 MG tablet Take 1 tablet (60 mg total) by mouth every 8 (eight) hours. Generic OK 90 tablet 0  . sertraline (ZOLOFT) 100 MG tablet Take 50 mg by mouth every morning.     . Vitamin D, Ergocalciferol, (DRISDOL) 50000 units CAPS capsule Take 1 capsule (50,000 Units total) by mouth every 7 (seven) days. 4 capsule 1  . ferrous sulfate 325 (65 FE) MG tablet Take 1 tablet (325 mg total) by mouth 2 (two) times daily with a meal. (Patient not taking: Reported on 05/01/2015) 60 tablet 0  . predniSONE (DELTASONE) 20 MG tablet Take 2 tablets (40 mg total) by mouth daily with breakfast. (Patient not taking: Reported on 05/01/2015) 10 tablet 0    Objective: BP 147/89 mmHg  Pulse 81  Temp(Src) 98.2 F (36.8 C) (Oral)  Resp 20  SpO2 99% Exam: General: Obese, tired-appearing female, sitting back in  bed; husband at bedside Eyes: PERRL. Bilateral ptosis, L>R.   ENTM: Normal oropharynx without erythema. MMM.  Neck: FROM and supple though increased effort to keep head up, sitting up. More pronounced muscle tone of L. sternocleidomastoid compared to right. Cardiovascular: RRR, S1, S2, no m/r/g Chest: Lungs CTAB but with diminished breath sounds to mid lung fields. No wheezes. Speaking in complete sentences but speech slows somewhat with slight slurring as patient speaks over the course of several minutes. Anterior chest wall TTP.  Abdomen: +BS, TTP over LUQ and RUQ with splenomegaly appreciated about 3-4 fingerbreadths from ribcage MSK: Normal tone. Moves all extremities spontaneously.  Skin: Slight hyperpigmentation under breasts. No rashes or lesions appreciated.  Neuro: AOx3. Left upper eyelid ptosis. Reports diminished sensation to light touch across forehead. Otherwise, CNII-XII grossly intact. Speech becomes slightly more slurred the longer patient speaks. Strength 5/5 of upper and lower extremities.  Psych: Normal mood and affect.   Labs and Imaging: CBC BMET   Recent Labs Lab 05/15/15 2037  WBC 10.0  HGB 11.7*  HCT 38.5  PLT 382    Recent Labs Lab 05/15/15 2037  NA 142  K 3.6  CL 104  CO2 27  BUN 7  CREATININE 0.69  GLUCOSE 107*  CALCIUM 9.4      Rogue Bussing, MD 05/15/2015, 10:26 PM PGY-1, [facility] pager: [phone], text pages welcome  I have seen and evaluated the patient with Dr. Ola Spurr. I am in agreement with the note above in its revised form. My additions are in red.  Frances Medina B. Bonner Puna, MD, PGY-3 05/16/2015 7:16 AM

## 2015-05-15 NOTE — ED Notes (Signed)
Neuro-[facility] at bedside

## 2015-05-15 NOTE — Consult Note (Signed)
Neurology Consultation Reason for Consult: MG exacerbation Referring Physician: Threasa Alpha PA  CC: dysphagia, SOB  History is obtained from: patient and husband  HPI: Frances Medina is a 55 y.o. female with hx of obesity and multiple other medical problems who is currently being worked up for splenomegaly and who was dx with MG this past January.  At the time of dx she had similar sx.  Her sx have been bulbar for the most part but at times she is "weak all over".  She was dx in January and treated with IVIg and started on mestinon.  She has never been on prednisone or other immunomodulatory medications.  After her Jan dx she returned in February because of a recurrence of sx and received three more days of IVIg at the time.  While the intention was for her to be discharged on steroids - per notes - the patient tells me that she never had a prescription for prednisone and thus has not been taking it (please note that home meds indicate that she was taking 20mg  of prednisone - perhaps this needs to be clarified--.  However she has been taking mestinon 60 TID.  This pawst weekend she started to feel "not right and to have trouble swallowing.  Her eyelids became droopy on bpth sides (first time only the left side was affected).  Today she picked up her grandsons and she was exhausted.  She called her neurologist and he asked her to come to the ED for evaluation.  She admits to feeling SOB all day.  There is a postional aspect to this when she bends over too much or if she lies down.  Denies fevers, chills, CP.  ROS: A 14 point ROS was performed and is negative except as noted in the HPI.   Past Medical History  Diagnosis Date  . GERD (gastroesophageal reflux disease)   . 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 Knee injury   . Depression   . Seasonal allergies     takes Zytrec  . Fibroid uterus     size of a dime  . Umbilical hernia     watching , no plans for surgery at present  . H/O hiatal hernia   .  Hypertension     "went away when I stopped smoking"  . Anxiety   . Migraine     "maybe couple times/month" (03/20/2015)  . Arthritis     "knees" (03/20/2015)  . Osteoarthritis of right knee 08/30/2013  . Osteoarthritis of left knee 12/02/2013  . Myasthenia gravis ([place]) 2017  . E. coli UTI 04/07/2015    Family History  Problem Relation Age of Onset  . Heart disease Mother 31  . Hypertension Mother   . Stroke Father   . Hypertension Father   . Colon cancer Neg Hx   . Other Sister     Esophageal strictures  . Anemia Sister    Myasthenia gravis, splenomegaly unknown cause.  Social History:  reports that she quit smoking about 3 years ago. Her smoking use included Cigarettes. She has a 20 pack-year smoking history. She has never used smokeless tobacco. She reports that she does not drink alcohol or use illicit drugs.  Exam: Current vital signs: BP 147/89 mmHg  Pulse 81  Temp(Src) 98.2 F (36.8 C) (Oral)  Resp 20  SpO2 99% Vital signs in last 24 hours: Temp:  [98.2 F (36.8 C)] 98.2 F (36.8 C) (03/14 1929) Pulse Rate:  [  81-123] 81 (03/14 2130) Resp:  [16-20] 20 (03/14 2130) BP: (134-149)/(88-95) 147/89 mmHg (03/14 2130) SpO2:  [98 %-100 %] 99 % (03/14 2130)   Physical Exam  Constitutional: Appears well-developed and well-nourished.  Psych: Affect appropriate to situation Eyes: No scleral injection HENT: No OP obstrucion Head: Normocephalic.  Cardiovascular: Normal rate and regular rhythm.  Respiratory: Effort normal and breath sounds normal to anterior ascultation GI: Soft.  No distension. There is no tenderness.  Skin: WDI  Neuro: Mental Status: Patient is awake, alert, oriented to person, place, month, year, and situation. Counts to 9 on single breath Patient is able to give a clear and coherent history. No signs of aphasia or neglect Cranial Nerves: II: Visual Fields are full. Pupils are equal, round, and reactive to light.  III,IV, VI: EOMI without bialteral  ptosis L>R  Could not elicit diploplia.  V: Facial sensation is symmetric to temperature VII: Facial movement is symmetric.  VIII: hearing is intact to voice X: Uvula elevates symmetrically XI: Shoulder shrug is symmetric. XII: tongue is midline without atrophy or fasciculations.  Motor: Tone is normal. Bulk is normal. full strength was present in all four extremities - neck flexion 4/5 Sensory: Sensation is symmetric to light touch and temperature in the arms and legs. Deep Tendon Reflexes: Unable to obtain Plantars: Toes are downgoing bilaterally. Cerebellar: FNF and HKS are intact bilaterally    I have reviewed labs in epic and the results pertinent to this consultation are:  I have reviewed the images obtained: CXR  Impression: MG exacerbation probably in setting of med non-adherence for unclear reasons at this point - she was supposed to be on steroids but did not take them since last admission.  Recommendations: 1) admit to critical care step down, q2h neuro checks.  NIFs VCs q4h.  STAT call if VC and NIFs are abnormal and immedate consideration given to intubation with worsening SOB - currently satting 100% in room air.  2) has received solumedrol 125 IV - will need AC HS finger sticks and glycemic control. Gastro protection. 3) NPO for now.  I have ordered another dose of solumedrol for tumorrow but this can be cancelled by neurohospitalist team following in am if PLX or IVIG are going to be used to treat.  Currently pt unable to swallow tablets.  Will need to be reassessed by neuro team in am.   4) Infectious workup -  5) Might need PT/OT - lovenox for DVT prophylaxis. Case d/w ED team.

## 2015-05-15 NOTE — ED Provider Notes (Signed)
CSN: [id number]     Arrival date & time 05/15/15  1922 History   First MD Initiated Contact with Patient 05/15/15 1947     Chief Complaint  Patient presents with  . Shortness of Breath  . Myasthenia Gravis     (Consider location/radiation/quality/duration/timing/severity/associated sxs/prior Treatment) HPI  Patient is a 55 year old female diagnosed with myasthenia gravis, splenomegaly, she presents to the emergency room for shortness of breath, dizziness and trouble swallowing.  She spoke with her neurologist today with her recent concerns, he instructed her to increase frequency of pyridostigmine medication and come see her tomorrow however she is currently unable to swallow liquids or solids and she came to the ER for evaluation.  She was last able to swallow one of her pills at 11 AM, and since has vomited up solids, liquids and pills. She has postnasal drip which also causing her to vomit, so she is spitting everything out into a emesis bag.  She complains of generalized weakness to her face, both eyes drooping and blurry vision, weakness with chewing and swallowing. She complains of shortness of breath which is worse with laying flat. She also complains of abdominal pain in the left and right upper quadrants which has been constant for several months.  Left upper quadrant of her abdomen is having gradually worsening pain.   Past Medical History  Diagnosis Date  . GERD (gastroesophageal reflux disease)   . 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 Knee injury   . Depression   . Seasonal allergies     takes Zytrec  . Fibroid uterus     size of a dime  . Umbilical hernia     watching , no plans for surgery at present  . H/O hiatal hernia   . Hypertension     "went away when I stopped smoking"  . Anxiety   . Migraine     "maybe couple times/month" (03/20/2015)  . Arthritis     "knees" (03/20/2015)  . Osteoarthritis of right knee 08/30/2013  . Osteoarthritis of left knee 12/02/2013  .  Myasthenia gravis ([place]) 2017  . E. coli UTI 04/07/2015   Past Surgical History  Procedure Laterality Date  . Cesarean section  1987; 1989  . Dilation and curettage of uterus    . Tubal ligation  1989  . Vaginal hysterectomy  1990's?    "apparently took out one of my ovaries at the time too cause one's missing"  . Partial knee arthroplasty Right 08/30/2013    Procedure: RIGHT UNICOMPARTMENTAL KNEE;  Surgeon: Johnny Bridge, MD;  Location: [facility];  Service: Orthopedics;  Laterality: Right;  . Colonoscopy with propofol N/A 09/19/2013    Procedure: COLONOSCOPY WITH PROPOFOL;  Surgeon: Ladene Artist, MD;  Location: [facility];  Service: Endoscopy;  Laterality: N/A;  . Partial knee arthroplasty Left 12/02/2013    Procedure: LEFT KNEE UNI ARTHROPLASTY;  Surgeon: Johnny Bridge, MD;  Location: [facility];  Service: Orthopedics;  Laterality: Left;  . Esophagogastroduodenoscopy (egd) with propofol N/A 03/21/2015    Procedure: ESOPHAGOGASTRODUODENOSCOPY (EGD) WITH PROPOFOL;  Surgeon: Gatha Mayer, MD;  Location: [facility];  Service: Endoscopy;  Laterality: N/A;   Family History  Problem Relation Age of Onset  . Heart disease Mother 95  . Hypertension Mother   . Stroke Father   . Hypertension Father   . Colon cancer Neg Hx   . Other Sister     Esophageal strictures  . Anemia Sister  Social History  Substance Use Topics  . Smoking status: Former Smoker -- 0.50 packs/day for 40 years    Types: Cigarettes    Quit date: 05/14/2012  . Smokeless tobacco: Never Used  . Alcohol Use: No   OB History    No data available     Review of Systems  Constitutional: Negative for fever, chills and diaphoresis.  HENT: Positive for rhinorrhea and trouble swallowing. Negative for congestion, sneezing and sore throat.   Eyes: Negative.   Respiratory: Positive for shortness of breath. Negative for chest tightness and wheezing.   Cardiovascular: Negative.  Negative for chest pain,  palpitations and leg swelling.  Gastrointestinal: Positive for abdominal pain. Negative for nausea, vomiting, diarrhea, constipation, blood in stool and abdominal distention.  Genitourinary: Negative.   Musculoskeletal: Negative.   Skin: Negative.   Neurological: Positive for dizziness and weakness. Negative for facial asymmetry.  Psychiatric/Behavioral: Negative.   All other systems reviewed and are negative.     Allergies  Dicyclomine; Methocarbamol; Penicillins; Ciprofloxacin; and Shrimp  Home Medications   Prior to Admission medications   Medication Sig Start Date End Date Taking? Authorizing Provider  albuterol (PROVENTIL HFA;VENTOLIN HFA) 108 (90 Base) MCG/ACT inhaler Inhale 2 puffs into the lungs every 4 (four) hours as needed for wheezing or shortness of breath. 04/07/15  Yes Shawn C Joy, PA-C  Chlorphen-PE-Acetaminophen (NOREL AD) 4-10-325 MG TABS Take 1 tablet by mouth 2 (two) times daily as needed.   Yes Historical Provider, MD  clonazePAM (KLONOPIN) 1 MG tablet Take 0.5-1 mg by mouth 2 (two) times daily as needed for anxiety.   Yes Historical Provider, MD  estradiol (ESTRACE) 0.5 MG tablet Take 0.5 mg by mouth daily.   Yes Historical Provider, MD  LORazepam (ATIVAN) 2 MG tablet Take 1 tablet (2 mg total) by mouth once as needed for anxiety or sedation (use one tablet 15 -76mn before PET scan/CT. Do not use with Klonopin and ensure someone drives you to and from your procedure). 05/09/15  Yes KKinnie Feil MD  pyridostigmine (MESTINON) 60 MG tablet Take 1 tablet (60 mg total) by mouth every 8 (eight) hours. Generic OK 03/27/15  Yes PThurnell Lose MD  sertraline (ZOLOFT) 100 MG tablet Take 50 mg by mouth every morning.    Yes Historical Provider, MD  Vitamin D, Ergocalciferol, (DRISDOL) 50000 units CAPS capsule Take 1 capsule (50,000 Units total) by mouth every 7 (seven) days. 05/02/15  Yes KKinnie Feil MD  ferrous sulfate 325 (65 FE) MG tablet Take 1 tablet (325 mg total)  by mouth 2 (two) times daily with a meal. Patient not taking: Reported on 05/01/2015 04/07/15   SHelane GuntherJoy, PA-C  predniSONE (DELTASONE) 20 MG tablet Take 2 tablets (40 mg total) by mouth daily with breakfast. Patient not taking: Reported on 05/01/2015 04/07/15   Shawn C Joy, PA-C   BP 147/89 mmHg  Pulse 81  Temp(Src) 98.2 F (36.8 C) (Oral)  Resp 20  SpO2 99% Physical Exam  Constitutional: She is oriented to person, place, and time. She appears well-developed and well-nourished. She appears distressed.  HENT:  Nose: Nose normal.  Mouth/Throat: No oropharyngeal exudate.  Eyes: Conjunctivae are normal.  Neck: Normal range of motion. Neck supple.  Cardiovascular: Regular rhythm.  Tachycardia present.  Exam reveals no gallop and no friction rub.   No murmur heard. Pulmonary/Chest: Effort normal. No respiratory distress. She has no wheezes. She has no rales.  Diminished breath sounds bilaterally  Abdominal: Soft.  Bowel sounds are normal. She exhibits no distension. There is tenderness. There is guarding. There is no rebound.  Obese abdomen, tenderness to palpation from left upper quadrant to epigastrium to right upper quadrant with guarding, no rebound tenderness Exam limited by body habitus  Neurological: She is alert and oriented to person, place, and time. She is not disoriented. She displays no tremor. She exhibits abnormal muscle tone. She displays no seizure activity. GCS eye subscore is 4. GCS verbal subscore is 5. GCS motor subscore is 6.  Bilateral facial droop slightly worse on the right than the left, with tearing eyes, generalized weakness with facial muscles including eyebrow raise, symmetrical smile, uvula midline, no tongue deviation Grossly normal coordination and strength of extremities Slightly hoarse voice but phonation grossly normal, speech clear  Skin: She is not diaphoretic.  Nursing note and vitals reviewed.   ED Course  Procedures (including critical care time) Labs  Review Labs Reviewed  COMPREHENSIVE METABOLIC PANEL - Abnormal; Notable for the following:    Glucose, Bld 107 (*)    All other components within normal limits  CBC WITH DIFFERENTIAL/PLATELET - Abnormal; Notable for the following:    Hemoglobin 11.7 (*)    MCH 25.6 (*)    RDW 16.3 (*)    Neutro Abs 8.1 (*)    All other components within normal limits  LIPASE, BLOOD  URINALYSIS, ROUTINE W REFLEX MICROSCOPIC (NOT AT [facility])  PROTEIN ELECTROPHORESIS, SERUM  TSH  SEDIMENTATION RATE    Imaging Review Dg Chest 2 View  05/15/2015  CLINICAL DATA:  Shortness of breath. EXAM: CHEST  2 VIEW COMPARISON:  April 07, 2015. FINDINGS: The heart size and mediastinal contours are within normal limits. Hypoinflation of the lungs is noted. Mild bibasilar subsegmental atelectasis is noted. No pneumothorax or pleural effusion is noted. The visualized skeletal structures are unremarkable. IMPRESSION: Hypoinflation of the lungs with mild bibasilar subsegmental atelectasis. Electronically Signed   By: Marijo Conception, M.D.   On: 05/15/2015 21:06   Nm Pet Image Initial (pi) Skull Base To Thigh  05/14/2015  CLINICAL DATA:  Initial treatment strategy for lymphadenopathy and splenomegaly. Possibly lymphoproliferative disease. Myasthenia gravis. EXAM: NUCLEAR MEDICINE PET SKULL BASE TO THIGH TECHNIQUE: 14.8 mCi F-18 FDG was injected intravenously. Full-ring PET imaging was performed from the skull base to thigh after the radiotracer. CT data was obtained and used for attenuation correction and anatomic localization. FASTING BLOOD GLUCOSE:  Value: 97 mg/dl COMPARISON:  Chest CTA on 04/07/2015 FINDINGS: NECK No hypermetabolic lymph nodes in the neck. CHEST No hypermetabolic mediastinal or hilar nodes. No suspicious pulmonary nodules on the CT scan. ABDOMEN/PELVIS No abnormal hypermetabolic activity within the liver, pancreas, adrenal glands, or spleen. No evidence of splenomegaly. No hypermetabolic lymph nodes in the abdomen  or pelvis. SKELETON No focal hypermetabolic activity to suggest skeletal metastasis. Mild diffusely increased hypermetabolic activity seen throughout the axial and appendicular bone marrow. IMPRESSION: No hypermetabolic masses or lymphadenopathy identified. Mild diffusely increased metabolic activity throughout the bone marrow, without focal metastatic lesions. This finding is of uncertain clinical significance, and is likely due to marrow stimulation. Bone marrow biopsy could be considered further evaluation if clinically warranted. Electronically Signed   By: Earle Gell M.D.   On: 05/14/2015 16:33   I have personally reviewed and evaluated these images and lab results as part of my medical decision-making.   EKG Interpretation None      MDM   Pt with known myasthenia gravis, acutely worsening with dysphagia of  liquids and solids, unable to take medications today, with increased weakness and shortness of breath.  Patient's basic labs were obtained, she was given Solu-Medrol in the ER.  NIF and VC ordered per RT.  Neuro hospitalists consulted.  Dr. Wendee Beavers has seen and evaluated the pt and requests step-down admission, Q2 Hr neuro checks.    Family Practice to admit, neuro to consult.  Step down hold orders placed under Dr. McDiarmid for further work up and tx.  Final diagnoses:  None       Delsa Grana, PA-C 05/15/15 2229  Pattricia Boss, MD 05/17/15 [phone]

## 2015-05-16 ENCOUNTER — Inpatient Hospital Stay (HOSPITAL_COMMUNITY): Payer: BC Managed Care – PPO

## 2015-05-16 DIAGNOSIS — G7000 Myasthenia gravis without (acute) exacerbation: Secondary | ICD-10-CM | POA: Diagnosis present

## 2015-05-16 DIAGNOSIS — G7001 Myasthenia gravis with (acute) exacerbation: Secondary | ICD-10-CM | POA: Insufficient documentation

## 2015-05-16 DIAGNOSIS — I10 Essential (primary) hypertension: Secondary | ICD-10-CM | POA: Insufficient documentation

## 2015-05-16 DIAGNOSIS — J9601 Acute respiratory failure with hypoxia: Secondary | ICD-10-CM

## 2015-05-16 DIAGNOSIS — Z789 Other specified health status: Secondary | ICD-10-CM | POA: Insufficient documentation

## 2015-05-16 LAB — POCT I-STAT 3, ART BLOOD GAS (G3+)
Acid-Base Excess: 1 mmol/L (ref 0.0–2.0)
Acid-Base Excess: 2 mmol/L (ref 0.0–2.0)
Bicarbonate: 29.3 mEq/L — ABNORMAL HIGH (ref 20.0–24.0)
Bicarbonate: 27.2 mEq/L — ABNORMAL HIGH (ref 20.0–24.0)
O2 Saturation: 98 %
O2 Saturation: 99 %
Patient temperature: 97.6
Patient temperature: 97
TCO2: 31 mmol/L (ref 0–100)
TCO2: 29 mmol/L (ref 0–100)
pCO2 arterial: 59.4 mmHg (ref 35.0–45.0)
pCO2 arterial: 44.2 mmHg (ref 35.0–45.0)
pH, Arterial: 7.298 — ABNORMAL LOW (ref 7.350–7.450)
pH, Arterial: 7.393 (ref 7.350–7.450)
pO2, Arterial: 115 mmHg — ABNORMAL HIGH (ref 80.0–100.0)
pO2, Arterial: 124 mmHg — ABNORMAL HIGH (ref 80.0–100.0)

## 2015-05-16 LAB — DIRECT ANTIGLOBULIN TEST (NOT AT ARMC)
DAT, IgG: POSITIVE
DAT, complement: POSITIVE

## 2015-05-16 LAB — PROCALCITONIN: Procalcitonin: <0.1 ng/mL

## 2015-05-16 LAB — CBG MONITORING, ED: Glucose-Capillary: 189 mg/dL — ABNORMAL HIGH (ref 65–99)

## 2015-05-16 LAB — GLUCOSE, CAPILLARY
Glucose-Capillary: 196 mg/dL — ABNORMAL HIGH (ref 65–99)
Glucose-Capillary: 146 mg/dL — ABNORMAL HIGH (ref 65–99)
Glucose-Capillary: 143 mg/dL — ABNORMAL HIGH (ref 65–99)

## 2015-05-16 LAB — SEDIMENTATION RATE: Sed Rate: 101 mm/hr — ABNORMAL HIGH (ref 0–22)

## 2015-05-16 LAB — TSH: TSH: 1.406 u[IU]/mL (ref 0.350–4.500)

## 2015-05-16 MED ORDER — VITAL HIGH PROTEIN PO LIQD
1000.0000 mL | ORAL | Status: DC
  Administered 2015-05-16: 1000 mL

## 2015-05-16 MED ORDER — MIDAZOLAM HCL 2 MG/2ML IJ SOLN
2.0000 mg | INTRAMUSCULAR | Status: DC | PRN
  Administered 2015-05-19: 2 mg via INTRAVENOUS
  Filled 2015-05-16 (×12): qty 2

## 2015-05-16 MED ORDER — IMMUNE GLOBULIN (HUMAN) 20 GM/200ML IV SOLN
400.0000 mg/kg | INTRAVENOUS | Status: AC
  Administered 2015-05-16 – 2015-05-20 (×5): 50 g via INTRAVENOUS
  Filled 2015-05-16 (×9): qty 100

## 2015-05-16 MED ORDER — FENTANYL CITRATE (PF) 100 MCG/2ML IJ SOLN
100.0000 ug | Freq: Once | INTRAMUSCULAR | Status: AC
  Administered 2015-05-16: 100 ug via INTRAVENOUS

## 2015-05-16 MED ORDER — DOCUSATE SODIUM 50 MG/5ML PO LIQD
100.0000 mg | Freq: Two times a day (BID) | ORAL | Status: DC | PRN
  Filled 2015-05-16: qty 10

## 2015-05-16 MED ORDER — MIDAZOLAM HCL 2 MG/2ML IJ SOLN
2.0000 mg | Freq: Once | INTRAMUSCULAR | Status: AC
  Administered 2015-05-16: 2 mg via INTRAVENOUS

## 2015-05-16 MED ORDER — ROCURONIUM BROMIDE 50 MG/5ML IV SOLN
50.0000 mg | Freq: Once | INTRAVENOUS | Status: AC
  Administered 2015-05-16: 50 mg via INTRAVENOUS
  Filled 2015-05-16: qty 5

## 2015-05-16 MED ORDER — DEXMEDETOMIDINE HCL IN NACL 200 MCG/50ML IV SOLN
0.4000 ug/kg/h | INTRAVENOUS | Status: DC
  Administered 2015-05-16: 0.3 ug/kg/h via INTRAVENOUS
  Administered 2015-05-16 (×2): 0.4 ug/kg/h via INTRAVENOUS
  Filled 2015-05-16 (×3): qty 50

## 2015-05-16 MED ORDER — PANTOPRAZOLE SODIUM 40 MG IV SOLR: 40.0000 mg | Freq: Every day | INTRAVENOUS | Status: DC

## 2015-05-16 MED ORDER — PYRIDOSTIGMINE BROMIDE 60 MG PO TABS
60.0000 mg | ORAL_TABLET | Freq: Three times a day (TID) | ORAL | Status: DC
Start: 2015-05-16 — End: 2015-05-19
  Administered 2015-05-16 – 2015-05-19 (×9): 60 mg
  Filled 2015-05-16 (×9): qty 1

## 2015-05-16 MED ORDER — IOHEXOL 350 MG/ML SOLN: 100.0000 mL | Freq: Once | INTRAVENOUS | Status: DC | PRN

## 2015-05-16 MED ORDER — FENTANYL CITRATE (PF) 100 MCG/2ML IJ SOLN
100.0000 ug | INTRAMUSCULAR | Status: DC | PRN
  Administered 2015-05-16 – 2015-05-21 (×35): 100 ug via INTRAVENOUS
  Filled 2015-05-16 (×22): qty 2

## 2015-05-16 MED ORDER — CHLORHEXIDINE GLUCONATE 0.12% ORAL RINSE (MEDLINE KIT)
15.0000 mL | Freq: Two times a day (BID) | OROMUCOSAL | Status: DC
  Administered 2015-05-16 – 2015-05-22 (×13): 15 mL via OROMUCOSAL

## 2015-05-16 MED ORDER — ANTISEPTIC ORAL RINSE SOLUTION (CORINZ)
7.0000 mL | Freq: Four times a day (QID) | OROMUCOSAL | Status: DC
  Administered 2015-05-17 – 2015-05-22 (×25): 7 mL via OROMUCOSAL

## 2015-05-16 MED ORDER — ETOMIDATE 2 MG/ML IV SOLN
20.0000 mg | Freq: Once | INTRAVENOUS | Status: AC
  Administered 2015-05-16: 20 mg via INTRAVENOUS

## 2015-05-16 MED ORDER — NOREPINEPHRINE BITARTRATE 1 MG/ML IV SOLN
0.0000 ug/min | INTRAVENOUS | Status: DC
  Administered 2015-05-16: 5 ug/min via INTRAVENOUS
  Filled 2015-05-16: qty 4

## 2015-05-16 MED ORDER — MIDAZOLAM HCL 2 MG/2ML IJ SOLN
2.0000 mg | INTRAMUSCULAR | Status: DC | PRN
  Administered 2015-05-16 – 2015-05-21 (×36): 2 mg via INTRAVENOUS
  Filled 2015-05-16 (×25): qty 2

## 2015-05-16 MED ORDER — FENTANYL CITRATE (PF) 100 MCG/2ML IJ SOLN
100.0000 ug | INTRAMUSCULAR | Status: DC | PRN
  Administered 2015-05-18 – 2015-05-19 (×2): 100 ug via INTRAVENOUS
  Filled 2015-05-16 (×16): qty 2

## 2015-05-16 MED ORDER — SODIUM CHLORIDE 0.9 % IV BOLUS (SEPSIS)
1000.0000 mL | Freq: Once | INTRAVENOUS | Status: AC
  Administered 2015-05-16: 1000 mL via INTRAVENOUS

## 2015-05-16 MED ORDER — PYRIDOSTIGMINE BROMIDE 60 MG PO TABS
60.0000 mg | ORAL_TABLET | Freq: Three times a day (TID) | ORAL | Status: DC
  Filled 2015-05-16: qty 1

## 2015-05-16 NOTE — Progress Notes (Signed)
eLink Physician-Brief Progress Note Patient Name: Frances Medina DOB: 1960-07-05 MRN: [id number]   Date of Service  05/16/2015  HPI/Events of Note  Remains Hypotensive - SBP = 48. Precedex IV infusion off.   eICU Interventions  Will order: 1. Norepinephrine IV infusion. Titrate to MAP >= 65. 2. Place A-line. 3. Monitor CVP.     Intervention Category Major Interventions: Respiratory failure - evaluation and management;Acid-Base disturbance - evaluation and management;Hypotension - evaluation and management  Lysle Dingwall 05/16/2015, 3:38 PM

## 2015-05-16 NOTE — Procedures (Signed)
OGT Insertion By MD  OGT inserted under direct laryngoscopy and verified audibly.  Rush Farmer, M.D. [facility] Pulmonary/Critical Care Medicine. Pager: [phone]. After hours pager: [phone].

## 2015-05-16 NOTE — ED Notes (Signed)
Admitting MD at the bedside.  

## 2015-05-16 NOTE — Progress Notes (Signed)
PULMONARY / CRITICAL CARE MEDICINE   Name: Frances Medina MRN: [id number] DOB: Nov 03, 1960    ADMISSION DATE:  05/15/2015 CONSULTATION DATE: 05/16/2015  REFERRING MD:   CHIEF COMPLAINT: Weakness, dysphagia, SOB in setting of known MG   HISTORY OF PRESENT ILLNESS:  55 y.o. female presenting to ED on 3/14 with increased weakness, SOB, and dysphagia over the last 7 d PTA.. Of note, she was recently diagnosed with myasthenia gravis during hospitalization in January 2017, during which time she required treatment with IVIG and was started on pyridostigmine. She had another hospitalization for MG flare in February, also requiring treatment with IVIG and steroid taper, thought to have been triggered by a UTI. She was supposed to have continued prednisone upon last hospital discharge 04/13/15, but patient was not aware that she had a prescription. She called her neurologist Dr. Audelia Acton last Friday about her dysphagia, and he recommended increasing frequency of pyridostigmine 60 mg to q6h from q8h. He also noted that patient would need to start steroids or be seen in the ED. Patient had an appointment to see him 05/16/15 with plans to start prednisone, but with increasing fatigue, she decided to be evaluated in the ED. Swallowing has become more difficult since last Friday, 05/11/15. She was only able to eat "a few nuggets" today. She was, however, able to take all but her evening dose of pyridostigmine. She has been able to drink fluids but not as much as usual. She and her husband report that she has trouble with her speech if she talks too much. She also complains of chest pain under her breast that feels like "it's swollen" and cuts off her breath since yesterday. She has had rhinorrhea with post-nasal drip and cough in the middle of the night since last weak but has not taken any new OTC medications. No sick contacts.In the ED on 3/14, patient's vital signs were stable, and SpO2 was 99% on room air. She was to be  admitted to the ICU. Since presentation her swallowing fxn declined further w/ difficulty swallowing oral secretions. A NIF was recorded at -25, and her VC was 518ml. PCCM was consulted given concern for impending respiratory failure.   PAST MEDICAL HISTORY :  She  has a past medical history of GERD (gastroesophageal reflux disease); Tumors; Knee injury; Depression; Seasonal allergies; Fibroid uterus; Umbilical hernia; H/O hiatal hernia; Hypertension; Anxiety; Migraine; Arthritis; Osteoarthritis of right knee (08/30/2013); Osteoarthritis of left knee (12/02/2013); Myasthenia gravis ([place]) (2017); and E. coli UTI (04/07/2015).  PAST SURGICAL HISTORY: She  has past surgical history that includes Cesarean section (1987; 1989); Dilation and curettage of uterus; Tubal ligation (1989); Vaginal hysterectomy (1990's?); Partial knee arthroplasty (Right, 08/30/2013); Colonoscopy with propofol (N/A, 09/19/2013); Partial knee arthroplasty (Left, 12/02/2013); and Esophagogastroduodenoscopy (egd) with propofol (N/A, 03/21/2015).  Allergies  Allergen Reactions  . Dicyclomine Hives  . Methocarbamol Hives  . Penicillins Hives    Has patient had a PCN reaction causing immediate rash, facial/tongue/throat swelling, SOB or lightheadedness with hypotension: Yes Has patient had a PCN reaction causing severe rash involving mucus membranes or skin necrosis: No Has patient had a PCN reaction that required hospitalization No Has patient had a PCN reaction occurring within the last 10 years: No If all of the above answers are "NO", then may proceed with Cephalosporin use.   . Ciprofloxacin     Rash, shortness of breath  . Shrimp [Shellfish Allergy]     No current facility-administered medications on file prior to encounter.  Current Outpatient Prescriptions on File Prior to Encounter  Medication Sig  . albuterol (PROVENTIL HFA;VENTOLIN HFA) 108 (90 Base) MCG/ACT inhaler Inhale 2 puffs into the lungs every 4 (four) hours as  needed for wheezing or shortness of breath.  . Chlorphen-PE-Acetaminophen (NOREL AD) 4-10-325 MG TABS Take 1 tablet by mouth 2 (two) times daily as needed.  . clonazePAM (KLONOPIN) 1 MG tablet Take 0.5-1 mg by mouth 2 (two) times daily as needed for anxiety.  [name] estradiol (ESTRACE) 0.5 MG tablet Take 0.5 mg by mouth daily.  [name] LORazepam (ATIVAN) 2 MG tablet Take 1 tablet (2 mg total) by mouth once as needed for anxiety or sedation (use one tablet 15 -12min before PET scan/CT. Do not use with Klonopin and ensure someone drives you to and from your procedure).  . pyridostigmine (MESTINON) 60 MG tablet Take 1 tablet (60 mg total) by mouth every 8 (eight) hours. Generic OK  . sertraline (ZOLOFT) 100 MG tablet Take 50 mg by mouth every morning.   . Vitamin D, Ergocalciferol, (DRISDOL) 50000 units CAPS capsule Take 1 capsule (50,000 Units total) by mouth every 7 (seven) days.  . ferrous sulfate 325 (65 FE) MG tablet Take 1 tablet (325 mg total) by mouth 2 (two) times daily with a meal. (Patient not taking: Reported on 05/01/2015)  . predniSONE (DELTASONE) 20 MG tablet Take 2 tablets (40 mg total) by mouth daily with breakfast. (Patient not taking: Reported on 05/01/2015)    FAMILY HISTORY:  Her indicated that her mother is deceased. She indicated that her father is deceased.   SOCIAL HISTORY: She  reports that she quit smoking about 3 years ago. Her smoking use included Cigarettes. She has a 20 pack-year smoking history. She has never used smokeless tobacco. She reports that she does not drink alcohol or use illicit drugs.  REVIEW OF SYSTEMS:   Per HPI with the following additions: No n/v/d. No fevers. No dysuria. Otherwise the remainder of the systems were negative  SUBJECTIVE:  Alert and oriented, severe dysphagia with trouble swallowing and speaking. Patient and husband both on board for intubation.   VITAL SIGNS: BP 165/98 mmHg  Pulse 77  Temp(Src) 97.6 F (36.4 C) (Oral)  Resp 18  SpO2  88%  HEMODYNAMICS:    VENTILATOR SETTINGS:    INTAKE / OUTPUT: I/O last 3 completed shifts: In: 3 [I.V.:3] Out: -   PHYSICAL EXAMINATION: General:  54 year old aaf, anxious and scared. Not in acute distress.  Neuro:  Awake, bilateral eye droop, muffled sp w/ difficulty swallowing. Equal st bilaterally  HEENT:  Awake, oriented, no focal motor def. Bilateral eye droop Cardiovascular:  rrr w/out MRG Lungs:  Clear w/out accessory muscle use. Occ rhonchi w/ upper airway  Abdomen:  Soft, no OM, + bowel sounds Musculoskeletal:  Equal st and bulk Skin:  Warm and dry   LABS:  BMET  Recent Labs Lab 05/15/15 2037  NA 142  K 3.6  CL 104  CO2 27  BUN 7  CREATININE 0.69  GLUCOSE 107*    Electrolytes  Recent Labs Lab 05/15/15 2037  CALCIUM 9.4    CBC  Recent Labs Lab 05/15/15 2037  WBC 10.0  HGB 11.7*  HCT 38.5  PLT 382    Coag's No results for input(s): APTT, INR in the last 168 hours.  Sepsis Markers No results for input(s): LATICACIDVEN, PROCALCITON, O2SATVEN in the last 168 hours.  ABG No results for input(s): PHART, PCO2ART, PO2ART in the last 168  hours.  Liver Enzymes  Recent Labs Lab 05/15/15 2037  AST 29  ALT 20  ALKPHOS 81  BILITOT 0.5  ALBUMIN 3.6    Cardiac Enzymes No results for input(s): TROPONINI, PROBNP in the last 168 hours.  Glucose  Recent Labs Lab 05/14/15 1130 05/16/15 0739  GLUCAP 97 189*    Imaging Dg Chest 2 View  05/15/2015  CLINICAL DATA:  Shortness of breath. EXAM: CHEST  2 VIEW COMPARISON:  April 07, 2015. FINDINGS: The heart size and mediastinal contours are within normal limits. Hypoinflation of the lungs is noted. Mild bibasilar subsegmental atelectasis is noted. No pneumothorax or pleural effusion is noted. The visualized skeletal structures are unremarkable. IMPRESSION: Hypoinflation of the lungs with mild bibasilar subsegmental atelectasis. Electronically Signed   By: Marijo Conception, M.D.   On:  05/15/2015 21:06     STUDIES:  CXR 3/14 >> Hypoinflation of the lungs with mild bibasilar subsegmental atelectasis   CULTURES: Urinalysis 3/14 >> Positive nitrites with trace leukocytes  Urine 3/14 >>  ANTIBIOTICS:   SIGNIFICANT EVENTS: 2/10 >> D/C from hospital  3/13 >> Onset of severe SOB and weakness  3/14 >> Present to ED  3/15 >> IVIG and ETT   LINES/TUBES: ETT 3/15 >>  DISCUSSION: 55 year old female presenting to the ED on 3/14 with increased SOB and weakness. Was diagnosed with MG in January 2017. Discharged on 2/10 with prednisone, patient states that she was unaware of the prescription and had not been taking. Neurology and PCCM consulted. PCCM to admit. Due to patient severe dysphagia, decreased NIF and VC and inability to protect airway patient will be intubated, after which IVIG will be started and home mestinon will be restarted via NG/OG.   ASSESSMENT / PLAN:  PULMONARY A: At risk for impairment - hypoventilation R/T MG exacerbation, dysphagia at risk for aspiration  P:   Intubation in ICU for airway protection Full Vent support  PAD protocol  Wean as tolerated  Continue home albuterol prn  CARDIOVASCULAR A:  H/O HTN - on no home medications  P:  Telemetry Monitoring   RENAL A:   No acute problems  P:   Trend renal function panel  Electrolyte replacement as needed   GASTROINTESTINAL A:   Dysphagia  H/O GERD  P:   PPI IV BID Swallow Eval after extubation  NG/OG tube insertion for medications  Consult nutrition for tube feeding recommendations   HEMATOLOGIC A:   Anemia  P:  Trend CBC  Keep Hemoglobin greater 7  INFECTIOUS A:   Recent UTI P:   Trend WBC and Fever curve   ENDOCRINE A:   Hyperglycemia    P:   SSI  Q4 CBG  NEUROLOGIC A:   Myasthenia Gravis exacerbation  P:   Neurology following  Start IVIG Continue home mestinon   RASS goal: 0   FAMILY  - Updates: pending   - Inter-disciplinary family meet or  Palliative Care meeting due by:  3/22    Erick Colace ACNP-BC [place] Pager # [phone] OR # [phone] if no answer  Attending Note:  55 year old female with MG history presenting with an exacerbation.  Patient's airway protection is questionable at best on exam.  I reviewed CXR myself, no evidence of acute disease.  Neurology saw the patient's and would like to start IVIg.  Given inability to protect airway, will intubate, place central line (poor access) and admit to the ICU for IVIg.  Patient consented.  No steroids.  No need for abx at this time.  Will check ABG and CXR post intubation and adjust vent accordingly.  The patient is critically ill with multiple organ systems failure and requires high complexity decision making for assessment and support, frequent evaluation and titration of therapies, application of advanced monitoring technologies and extensive interpretation of multiple databases.   Critical Care Time devoted to patient care services described in this note is  35  Minutes. This time reflects time of care of this signee Dr Jennet Maduro. This critical care time does not reflect procedure time, or teaching time or supervisory time of PA/NP/Med student/Med Resident etc but could involve care discussion time.  Rush Farmer, M.D. [facility] Pulmonary/Critical Care Medicine. Pager: [phone]. After hours pager: [phone].

## 2015-05-16 NOTE — ED Notes (Signed)
Pt requested husband be called and updated on current condition. Husband now aware.

## 2015-05-16 NOTE — Progress Notes (Signed)
OT Cancellation Note  Patient Details Name: Frances Medina MRN: [id number] DOB: Jul 13, 1960   Cancelled Treatment:    Reason Eval/Treat Not Completed: Patient not medically ready  [place], OTR/L  [id number] 05/16/2015 05/16/2015, 10:20 PM

## 2015-05-16 NOTE — Progress Notes (Signed)
Patient performed NIF and VC with NIF being -25 cmH2O X3 and VC of 0.5L X3 with good effort.

## 2015-05-16 NOTE — Procedures (Signed)
Central Venous Catheter Insertion Procedure Note Frances Medina [id number] 1960-05-25  Procedure: Insertion of Central Venous Catheter Indications: Assessment of intravascular volume, Drug and/or fluid administration and Frequent blood sampling  Procedure Details Consent: Risks of procedure as well as the alternatives and risks of each were explained to the (patient/caregiver).  Consent for procedure obtained. Time Out: Verified patient identification, verified procedure, site/side was marked, verified correct patient position, special equipment/implants available, medications/allergies/relevent history reviewed, required imaging and test results available.  Performed Real time [place] used to ID and cannulate the vessel  Maximum sterile technique was used including antiseptics, cap, gloves, gown, hand hygiene, mask and sheet. Skin prep: Chlorhexidine; local anesthetic administered A antimicrobial bonded/coated triple lumen catheter was placed in the right internal jugular vein using the Seldinger technique.  Evaluation Blood flow good Complications: No apparent complications Patient did tolerate procedure well. Chest X-ray ordered to verify placement.  CXR: pending.  Frances Medina 05/16/2015, 12:56 PM  Rush Farmer, M.D. [facility] Pulmonary/Critical Care Medicine. Pager: [phone]. After hours pager: [phone].

## 2015-05-16 NOTE — Progress Notes (Signed)
Foley catheter inserted by 2RNs. Pt stated her reaction to shellfish is hives so through peri care was done with foley wipes prior to insertion. Pt tolerated procedure well with no complications.

## 2015-05-16 NOTE — Progress Notes (Signed)
eLink Physician-Brief Progress Note Patient Name: Frances Medina DOB: 09/19/1960 MRN: [id number]   Date of Service  05/16/2015  HPI/Events of Note  Called by RN about pt going for CTA chest to r/o PE, stat  eICU Interventions  Low Wells score, 99% sats on RA on admission, no significant indication for CTA chest tonight. Primary team to re-eval need for CTA chest in the AM.      Intervention Category Intermediate Interventions: Other:  Alzora Ha 05/16/2015, 11:47 PM

## 2015-05-16 NOTE — Progress Notes (Signed)
Family Medicine Teaching Service Daily Progress Note Intern Pager: [phone]  Patient name: Frances Medina Medical record number: [id number] Date of birth: [birth year] Age: 55 y.o. Gender: female  Primary Care Provider: Andrena Mews, MD Consultants: Neurology Code Status: Full  Pt Overview and Major Events to Date:  Admitted 05/15/15 Transferred to Neuro ICU 05/16/15  Assessment and Plan: Frances Medina is a 55 y.o. female presenting with myasthenia gravis exacerbation. PMH is significant for HTN,uterine fibroids, depression, anxiety, migraines and recently diagnosed myasthenia gravis, and undergoing work-up of splenomegaly with lymphadenopathy.   Myasthenia Gravis Exacerbation: Unclear trigger of symptoms, though discontinuation of steroids is most likely cause. Rhinorrhea but no fevers or leukocytosis. UA was positive for nitrites and trace leukocytes. Initial plan was to admit to SDU on continuous cardiopulmonary monitoring. However, initial NIF score was -25, so patient will be transferred to neuro ICU.  - Neurology consulting, appreciate recommendations: s/p IV solumedrol 125 mg in the ED; a.m. team to decide whether to continue IV solumedrol vs. plx or IVIG --> IVIG - TSH WNL, Sed rate elevated at 101, SPEP ordered, per neuro - q2h neuro checks - NIFs/VCs q4h - NPO until evaluated by SLP; would like clearance for sips with meds - Restart home pyridostigmine - SSI and q4h CBGs while on steroids - Protonix 40 mg IV daily for gastric protection while on steroids and NPO - Low threshold for intubation for worsening SOB - Follows with Neurologist Dr. Audelia Acton of [facility]; notify of hospitalization in the a.m. - PT/OT consulted - Continue home prn albuterol for wheezing/SOB - Prn oxygen as needed to maintain O2 saturations >/= 92% - Drugs to avoid in MG: aminoglycosides, clindamycin, fluoroquinolones, ketolides, vancomycin, beta blockers, procainamide, quinidine, anti-PD-1  monoclonal antibodies, botox, chloroquine, hydroxychloroquine, magnesium, penicillamine, quinine - Consider treatment of possible UTI once urine cx results, being mindful of abx choice  HFpEF: ECHO 04/09/15 performed due to SOB showed EF of 60-65% and G1DD. Patient currently appears euvolemic. - Monitor fluid status as administer MIVFs while NPO.   Splenomegaly w/Lymphadenopathy: Being followed by Oncologist Dr. Irene Limbo. Work-up thus far has shown HIV neg, mono screen neg, elevated LDH at 395 and low haptoglobin at <10 (with recent IVIG treatment). To repeat hemolytic markers, CBC and Coombs' test later this month at follow-up. No hypermetabolic masses or lymphadenopathy seen on PET scan 05/14/15. - Continue to monitor CBCs - Consider pursuing autoimmune work-up while hospitalized   Hx of HTN: 111/78-149/95. - Not on any home anti-hypertensives - Continue to monitor. Will add prn if needed.   Depression and Anxiety: - Hold zoloft while NPO - Hold klonopin and ativan (ordered for PET scan yesterday) to avoid over-sedation  FEN/GI: NPO until swallow eval, MIVFs (D5 1/2NS) while NPO, protonix Prophylaxis: Lovenox  Disposition: Will be admitted to Neuro ICU for close monitoring of respiratory function while undergoing treatment for MG exacerbation; Family Medicine will gladly resume primary team status once patient is medically stable  Subjective:  Patient feels her breathing is better this morning after receiving steroids. She does not feel SOB laying flat.   Objective: Temp:  [98.2 F (36.8 C)] 98.2 F (36.8 C) (03/14 1929) Pulse Rate:  [78-123] 84 (03/15 0600) Resp:  [15-24] 16 (03/15 0208) BP: (111-149)/(70-95) 127/81 mmHg (03/15 0600) SpO2:  [92 %-100 %] 93 % (03/15 0600)   Physical Exam: General: Obese, tired-appearing female, resting in bed Eyes: PERRL. Bilateral ptosis, L>R.  ENTM: Normal oropharynx without erythema. MMM.  Neck: FROM and supple  though increased effort to keep  head up, sitting up.  Cardiovascular: RRR, S1, S2, no m/r/g Chest: Lungs CTAB but with diminished breath sounds over lower lung fields. No wheezes. Speaking in complete sentences but dysarthria develops as patient speaks over the course of several minutes. Anterior chest wall TTP.  Abdomen: +BS, TTP over LUQ and RUQ (R>L) with splenomegaly appreciated about 3-4 fingerbreadths from ribcage MSK: Normal tone. Moves all extremities spontaneously.  Skin: Moist with slight hyperpigmentation under breasts. No rashes or lesions appreciated.  Neuro: AOx3. Left upper eyelid ptosis. CNII-XII grossly intact. Speech becomes slightly more slurred the longer patient speaks. Strength 5/5 of upper and lower extremities.  Psych: Normal mood and affect.   Laboratory:  Recent Labs Lab 05/15/15 2037  WBC 10.0  HGB 11.7*  HCT 38.5  PLT 382    Recent Labs Lab 05/15/15 2037  NA 142  K 3.6  CL 104  CO2 27  BUN 7  CREATININE 0.69  CALCIUM 9.4  PROT 7.5  BILITOT 0.5  ALKPHOS 81  ALT 20  AST 29  GLUCOSE 107*    Imaging/Diagnostic Tests: Dg Chest 2 View  05/15/2015  CLINICAL DATA:  Shortness of breath. EXAM: CHEST  2 VIEW COMPARISON:  April 07, 2015. FINDINGS: The heart size and mediastinal contours are within normal limits. Hypoinflation of the lungs is noted. Mild bibasilar subsegmental atelectasis is noted. No pneumothorax or pleural effusion is noted. The visualized skeletal structures are unremarkable. IMPRESSION: Hypoinflation of the lungs with mild bibasilar subsegmental atelectasis. Electronically Signed   By: Marijo Conception, M.D.   On: 05/15/2015 21:06    Rogue Bussing, MD 05/16/2015, 6:56 AM PGY-1, [place] Intern pager: [phone], text pages welcome

## 2015-05-16 NOTE — ED Notes (Signed)
Called Respiratory to come perform NIF on patient. Stated he would need to obtain material from upstairs and then he would perform.

## 2015-05-16 NOTE — Progress Notes (Signed)
Interval History:                                                                                                                      Frances Medina is an 55 y.o. female patient with MG and likely exacerbation. Currently in ED and complaining of increased SOB and ptosis. NIF -25.  Called the primary team and patient will be sent to ICU for closer evaluation.    Past Medical History: Past Medical History  Diagnosis Date  . GERD (gastroesophageal reflux disease)   . 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 Knee injury   . Depression   . Seasonal allergies     takes Zytrec  . Fibroid uterus     size of a dime  . Umbilical hernia     watching , no plans for surgery at present  . H/O hiatal hernia   . Hypertension     "went away when I stopped smoking"  . Anxiety   . Migraine     "maybe couple times/month" (03/20/2015)  . Arthritis     "knees" (03/20/2015)  . Osteoarthritis of right knee 08/30/2013  . Osteoarthritis of left knee 12/02/2013  . Myasthenia gravis ([place]) 2017  . E. coli UTI 04/07/2015    Past Surgical History  Procedure Laterality Date  . Cesarean section  1987; 1989  . Dilation and curettage of uterus    . Tubal ligation  1989  . Vaginal hysterectomy  1990's?    "apparently took out one of my ovaries at the time too cause one's missing"  . Partial knee arthroplasty Right 08/30/2013    Procedure: RIGHT UNICOMPARTMENTAL KNEE;  Surgeon: Johnny Bridge, MD;  Location: [place];  Service: Orthopedics;  Laterality: Right;  . Colonoscopy with propofol N/A 09/19/2013    Procedure: COLONOSCOPY WITH PROPOFOL;  Surgeon: Ladene Artist, MD;  Location: [facility];  Service: Endoscopy;  Laterality: N/A;  . Partial knee arthroplasty Left 12/02/2013    Procedure: LEFT KNEE UNI ARTHROPLASTY;  Surgeon: Johnny Bridge, MD;  Location: [place];  Service: Orthopedics;  Laterality: Left;  . Esophagogastroduodenoscopy (egd) with propofol N/A 03/21/2015    Procedure:  ESOPHAGOGASTRODUODENOSCOPY (EGD) WITH PROPOFOL;  Surgeon: Gatha Mayer, MD;  Location: [place];  Service: Endoscopy;  Laterality: N/A;    Family History: Family History  Problem Relation Age of Onset  . Heart disease Mother 33  . Hypertension Mother   . Stroke Father   . Hypertension Father   . Colon cancer Neg Hx   . Other Sister     Esophageal strictures  . Anemia Sister     Social History:   reports that she quit smoking about 3 years ago. Her smoking use included Cigarettes. She has a 20 pack-year smoking history. She has never used smokeless tobacco. She reports that she does not drink alcohol or use illicit drugs.  Allergies:  Allergies  Allergen Reactions  . Dicyclomine Hives  .  Methocarbamol Hives  . Penicillins Hives    Has patient had a PCN reaction causing immediate rash, facial/tongue/throat swelling, SOB or lightheadedness with hypotension: Yes Has patient had a PCN reaction causing severe rash involving mucus membranes or skin necrosis: No Has patient had a PCN reaction that required hospitalization No Has patient had a PCN reaction occurring within the last 10 years: No If all of the above answers are "NO", then may proceed with Cephalosporin use.   . Ciprofloxacin     Rash, shortness of breath  . Shrimp [Shellfish Allergy]      Medications:                                                                                                                         Current facility-administered medications:  .  albuterol (PROVENTIL HFA;VENTOLIN HFA) 108 (90 Base) MCG/ACT inhaler 2 puff, 2 puff, Inhalation, Q4H PRN, Rogue Bussing, MD .  dextrose 5 %-0.45 % sodium chloride infusion, , Intravenous, Continuous, Rogue Bussing, MD, Last Rate: 125 mL/hr at 05/16/15 0203 .  enoxaparin (LOVENOX) injection 60 mg, 60 mg, Subcutaneous, Q24H, Hillary Corinda Gubler, MD .  Immune Globulin 10% (OCTAGAM) IV infusion 50 g, 400 mg/kg, Intravenous, Q24 Hr  x 5, Marliss Coots, PA-C .  insulin aspart (novoLOG) injection 0-9 Units, 0-9 Units, Subcutaneous, 6 times per day, Rogue Bussing, MD, 2 Units at 05/16/15 0815 .  pantoprazole (PROTONIX) injection 40 mg, 40 mg, Intravenous, Q24H, Rogue Bussing, MD, 40 mg at 05/16/15 0111 .  sodium chloride flush (NS) 0.9 % injection 3 mL, 3 mL, Intravenous, Q12H, Rogue Bussing, MD, 3 mL at 05/16/15 0112  Current outpatient prescriptions:  .  albuterol (PROVENTIL HFA;VENTOLIN HFA) 108 (90 Base) MCG/ACT inhaler, Inhale 2 puffs into the lungs every 4 (four) hours as needed for wheezing or shortness of breath., Disp: 1 Inhaler, Rfl: 0 .  Chlorphen-PE-Acetaminophen (NOREL AD) 4-10-325 MG TABS, Take 1 tablet by mouth 2 (two) times daily as needed., Disp: , Rfl:  .  clonazePAM (KLONOPIN) 1 MG tablet, Take 0.5-1 mg by mouth 2 (two) times daily as needed for anxiety., Disp: , Rfl:  .  estradiol (ESTRACE) 0.5 MG tablet, Take 0.5 mg by mouth daily., Disp: , Rfl:  .  LORazepam (ATIVAN) 2 MG tablet, Take 1 tablet (2 mg total) by mouth once as needed for anxiety or sedation (use one tablet 15 -42mn before PET scan/CT. Do not use with Klonopin and ensure someone drives you to and from your procedure)., Disp: 2 tablet, Rfl: 0 .  pyridostigmine (MESTINON) 60 MG tablet, Take 1 tablet (60 mg total) by mouth every 8 (eight) hours. Generic OK, Disp: 90 tablet, Rfl: 0 .  sertraline (ZOLOFT) 100 MG tablet, Take 50 mg by mouth every morning. , Disp: , Rfl:  .  Vitamin D, Ergocalciferol, (DRISDOL) 50000 units CAPS capsule, Take 1 capsule (50,000 Units total) by mouth every 7 (seven) days., Disp: 4 capsule, Rfl:  1 .  ferrous sulfate 325 (65 FE) MG tablet, Take 1 tablet (325 mg total) by mouth 2 (two) times daily with a meal. (Patient not taking: Reported on 05/01/2015), Disp: 60 tablet, Rfl: 0 .  predniSONE (DELTASONE) 20 MG tablet, Take 2 tablets (40 mg total) by mouth daily with breakfast. (Patient not taking:  Reported on 05/01/2015), Disp: 10 tablet, Rfl: 0   Neurologic Examination:                                                                                                     Today's Vitals   05/16/15 0600 05/16/15 0810 05/16/15 0830 05/16/15 0900  BP: 127/81 157/101 147/99 165/98  Pulse: 84 81 81 77  Temp:  97.6 F (36.4 C)    TempSrc:  Oral    Resp:  '20 21 18  ' SpO2: 93% 94% 93% 88%  PainSc:  5       Evaluation of higher integrative functions including: Level of alertness: Alert, SOB able to count to 8 in one breath.  Oriented to time, place and person Speech: fluent, no evidence of dysarthria or aphasia noted.  Test the following cranial nerves: EOMI, TML, bilateral ptosis with left>right. Head strength 5/5 including jaw.  Motor examination: Normal tone, bulk, full 5/5 motor strength in all 4 extremities Examination of sensation : Normal and symmetric sensation to pinprick in all 4 extremities and on face Examination of deep tendon reflexes: no DTR Test coordination: Normal finger nose testing,  Gait: Deferred   Lab Results: Basic Metabolic Panel:  Recent Labs Lab 05/15/15 2037  NA 142  K 3.6  CL 104  CO2 27  GLUCOSE 107*  BUN 7  CREATININE 0.69  CALCIUM 9.4    Liver Function Tests:  Recent Labs Lab 05/15/15 2037  AST 29  ALT 20  ALKPHOS 81  BILITOT 0.5  PROT 7.5  ALBUMIN 3.6    Recent Labs Lab 05/15/15 2037  LIPASE 36   No results for input(s): AMMONIA in the last 168 hours.  CBC:  Recent Labs Lab 05/15/15 2037  WBC 10.0  NEUTROABS 8.1*  HGB 11.7*  HCT 38.5  MCV 84.2  PLT 382    Cardiac Enzymes: No results for input(s): CKTOTAL, CKMB, CKMBINDEX, TROPONINI in the last 168 hours.  Lipid Panel: No results for input(s): CHOL, TRIG, HDL, CHOLHDL, VLDL, LDLCALC in the last 168 hours.  CBG:  Recent Labs Lab 05/14/15 1130 05/16/15 0739  GLUCAP 97 189*    Microbiology: Results for orders placed or performed in visit on 05/03/15   TECHNOLOGIST REVIEW     Status: None   Collection Time: 05/03/15  2:59 PM  Result Value Ref Range Status   Technologist Review Variant lymphs present, few spherocytes   Final    Imaging: Dg Chest 2 View  05/15/2015  CLINICAL DATA:  Shortness of breath. EXAM: CHEST  2 VIEW COMPARISON:  April 07, 2015. FINDINGS: The heart size and mediastinal contours are within normal limits. Hypoinflation of the lungs is noted. Mild bibasilar subsegmental atelectasis is noted. No pneumothorax or pleural  effusion is noted. The visualized skeletal structures are unremarkable. IMPRESSION: Hypoinflation of the lungs with mild bibasilar subsegmental atelectasis. Electronically Signed   By: Marijo Conception, M.D.   On: 05/15/2015 21:06   Nm Pet Image Initial (pi) Skull Base To Thigh  05/14/2015  CLINICAL DATA:  Initial treatment strategy for lymphadenopathy and splenomegaly. Possibly lymphoproliferative disease. Myasthenia gravis. EXAM: NUCLEAR MEDICINE PET SKULL BASE TO THIGH TECHNIQUE: 14.8 mCi F-18 FDG was injected intravenously. Full-ring PET imaging was performed from the skull base to thigh after the radiotracer. CT data was obtained and used for attenuation correction and anatomic localization. FASTING BLOOD GLUCOSE:  Value: 97 mg/dl COMPARISON:  Chest CTA on 04/07/2015 FINDINGS: NECK No hypermetabolic lymph nodes in the neck. CHEST No hypermetabolic mediastinal or hilar nodes. No suspicious pulmonary nodules on the CT scan. ABDOMEN/PELVIS No abnormal hypermetabolic activity within the liver, pancreas, adrenal glands, or spleen. No evidence of splenomegaly. No hypermetabolic lymph nodes in the abdomen or pelvis. SKELETON No focal hypermetabolic activity to suggest skeletal metastasis. Mild diffusely increased hypermetabolic activity seen throughout the axial and appendicular bone marrow. IMPRESSION: No hypermetabolic masses or lymphadenopathy identified. Mild diffusely increased metabolic activity throughout the bone  marrow, without focal metastatic lesions. This finding is of uncertain clinical significance, and is likely due to marrow stimulation. Bone marrow biopsy could be considered further evaluation if clinically warranted. Electronically Signed   By: Earle Gell M.D.   On: 05/14/2015 16:33    Assessment and plan:   Frances Medina is an 55 y.o. female patient with likely MD exacerbation. Currently still in ED with increasing SOB and NIF -25. Concerned for worsening symptoms and primary team is going to get a bed in neuro ICU.  CTA chest to be obtained for possible PE and then will start IVIG. NIF and VC Q 4 hours.   Recommend: 1) STAT swallow screen by speech--if passes start Mestinon at current home dose.  2) Stat CT chest to confirm no PE 3) Start IVIG after CTA chest 4) NIF/VC Q 4 (goal NIF >30) 5) Patient to be transferred to neuro ICU by primary team  Spoke in depth with primary team that this patient continues to have increasing SOB and the urgent need to have swallow screen and restart Mestinon.  Patient will need to be evaluated periodically to ensure breathing is intact and safe. IF severely compromised would recommend calling PCCM.   Seen with Dr. Silverio Decamp. Please see his attestation note for A/P for any additional work up recommendations.

## 2015-05-16 NOTE — Procedures (Signed)
Intubation Procedure Note SUZEN HETZER [id number] [birth year]  Procedure: Intubation Indications: Respiratory insufficiency  Procedure Details Consent: Risks of procedure as well as the alternatives and risks of each were explained to the (patient/caregiver).  Consent for procedure obtained. Time Out: Verified patient identification, verified procedure, site/side was marked, verified correct patient position, special equipment/implants available, medications/allergies/relevent history reviewed, required imaging and test results available.  Performed  Maximum sterile technique was used including gloves, hand hygiene and mask.  MAC    Evaluation Hemodynamic Status: BP stable throughout; O2 sats: stable throughout Patient's Current Condition: stable Complications: No apparent complications Patient did tolerate procedure well. Chest X-ray ordered to verify placement.  CXR: pending.   Jennet Maduro 05/16/2015

## 2015-05-16 NOTE — Progress Notes (Signed)
eLink Physician-Brief Progress Note Patient Name: Frances Medina DOB: 12/21/1960 MRN: [id number]   Date of Service  05/16/2015  HPI/Events of Note  Hypotension - BP = 57/44. Sedation with Precedex has been decreased.   eICU Interventions  Will order:  1. 0.9 NaCl 1 liter IV over 1 hour now.      Intervention Category Major Interventions: Hypotension - evaluation and management  Jaequan Propes [place] 05/16/2015, 3:22 PM

## 2015-05-16 NOTE — Progress Notes (Signed)
Pt. Performed -15 on the NIF and .4 Liters on the vital capacity. Pt. Lethargic at this time. RN was also present.

## 2015-05-16 NOTE — Procedures (Signed)
Arterial Catheter Insertion Procedure Note Frances Medina [id number] 1960/12/10  Procedure: Insertion of Arterial Catheter  Indications: Blood pressure monitoring and Frequent blood sampling  Procedure Details Consent: Unable to obtain consent because of emergent medical necessity. Time Out: Verified patient identification, verified procedure, site/side was marked, verified correct patient position, special equipment/implants available, medications/allergies/relevent history reviewed, required imaging and test results available.  Performed  Maximum sterile technique was used including antiseptics, cap, gloves, gown, hand hygiene, mask and sheet. Skin prep: Chlorhexidine; local anesthetic administered 20 gauge catheter was inserted into left radial artery using the Seldinger technique.  Evaluation Blood flow good; BP tracing good. Complications: No apparent complications.   Frances Medina 05/16/2015

## 2015-05-16 NOTE — ED Notes (Addendum)
Pt c/o increased SOB and increased anxiety. Dr Tamala Julian at bedside and aware and states he will upgrade her bed request to ICU.

## 2015-05-17 DIAGNOSIS — J9600 Acute respiratory failure, unspecified whether with hypoxia or hypercapnia: Secondary | ICD-10-CM

## 2015-05-17 LAB — FANA STAINING PATTERNS
Homogeneous Pattern: 1:80 {titer}
Speckled Pattern: 1:80 {titer}

## 2015-05-17 LAB — ANTINUCLEAR ANTIBODIES, IFA: ANA Ab, IFA: POSITIVE — AB

## 2015-05-17 LAB — PROTEIN ELECTROPHORESIS, SERUM
A/G Ratio: 1 (ref 0.7–1.7)
Albumin ELP: 3.5 g/dL (ref 2.9–4.4)
Alpha-1-Globulin: 0.3 g/dL (ref 0.0–0.4)
Alpha-2-Globulin: 0.6 g/dL (ref 0.4–1.0)
Beta Globulin: 1.3 g/dL (ref 0.7–1.3)
Gamma Globulin: 1.5 g/dL (ref 0.4–1.8)
Globulin, Total: 3.6 g/dL (ref 2.2–3.9)
Total Protein ELP: 7.1 g/dL (ref 6.0–8.5)

## 2015-05-17 LAB — LACTATE DEHYDROGENASE, ISOENZYMES
LDH 1: 30 % (ref 17–32)
LDH 2: 33 % (ref 25–40)
LDH 3: 21 % (ref 17–27)
LDH 4: 7 % (ref 5–13)
LDH 5: 9 % (ref 4–20)
LDH Isoenzymes, Total: 300 IU/L — ABNORMAL HIGH (ref 119–226)

## 2015-05-17 LAB — GLUCOSE, CAPILLARY
Glucose-Capillary: 100 mg/dL — ABNORMAL HIGH (ref 65–99)
Glucose-Capillary: 110 mg/dL — ABNORMAL HIGH (ref 65–99)
Glucose-Capillary: 119 mg/dL — ABNORMAL HIGH (ref 65–99)
Glucose-Capillary: 120 mg/dL — ABNORMAL HIGH (ref 65–99)
Glucose-Capillary: 122 mg/dL — ABNORMAL HIGH (ref 65–99)
Glucose-Capillary: 165 mg/dL — ABNORMAL HIGH (ref 65–99)

## 2015-05-17 LAB — PROCALCITONIN: Procalcitonin: <0.1 ng/mL

## 2015-05-17 LAB — HEMOGLOBIN A1C
Hgb A1c MFr Bld: 4.4 % — ABNORMAL LOW (ref 4.8–5.6)
Mean Plasma Glucose: 80 mg/dL

## 2015-05-17 LAB — HAPTOGLOBIN: Haptoglobin: 119 mg/dL (ref 34–200)

## 2015-05-17 MED ORDER — PRO-STAT SUGAR FREE PO LIQD
30.0000 mL | Freq: Three times a day (TID) | ORAL | Status: DC
  Administered 2015-05-17 – 2015-05-22 (×15): 30 mL
  Filled 2015-05-17 (×15): qty 30

## 2015-05-17 MED ORDER — ADULT MULTIVITAMIN W/MINERALS CH
1.0000 | ORAL_TABLET | Freq: Every day | ORAL | Status: DC
  Administered 2015-05-17 – 2015-06-08 (×22): 1 via ORAL
  Filled 2015-05-17 (×22): qty 1

## 2015-05-17 MED ORDER — VITAL HIGH PROTEIN PO LIQD
1000.0000 mL | ORAL | Status: DC
  Administered 2015-05-17: 17:00:00
  Administered 2015-05-18: 1000 mL
  Administered 2015-05-19: 09:00:00
  Administered 2015-05-19 – 2015-05-20 (×2): 1000 mL
  Administered 2015-05-21: 18:00:00

## 2015-05-17 NOTE — Care Management Note (Signed)
Case Management Note  Patient Details  Name: Frances Medina MRN: [id number] Date of Birth: 11/14/1960  Subjective/Objective:     Pt admitted on 05/15/15 with myesthenia gravis exacerbation.  PTA, pt independent, lives with spouse and 2 daughters.              Action/Plan: Pt currently sedated and on ventilator.  Will follow for discharge planning as pt progresses.    Expected Discharge Date:                  Expected Discharge Plan:  [place]  In-House Referral:     Discharge planning Services  CM Consult  Post Acute Care Choice:    Choice offered to:     DME Arranged:    DME Agency:     HH Arranged:    [place] Agency:     Status of Service:  In process, will continue to follow  Medicare Important Message Given:    Date Medicare IM Given:    Medicare IM give by:    Date Additional Medicare IM Given:    Additional Medicare Important Message give by:     If discussed at [place] of Stay Meetings, dates discussed:    Additional Comments:  Reinaldo Raddle, RN, BSN  Trauma/Neuro ICU Case Manager [phone]

## 2015-05-17 NOTE — Progress Notes (Signed)
SLP Cancellation Note  Patient Details Name: Frances Medina MRN: [id number] DOB: 1961-01-30   Cancelled treatment:       Reason Eval/Treat Not Completed: Medical issues which prohibited therapy (Remains intubated. Will f/u 3/17.)  [place], CCC-SLP [phone]  Frances Medina 05/17/2015, 10:10 AM

## 2015-05-17 NOTE — Progress Notes (Signed)
Interval History:                                                                                                                      Frances Medina is an 55 y.o. female patient with MG admitted for MG exacerbation. Currently on the Vent and not weaning off yet. She is back on her daily dose of Mestinon.    Past Medical History: Past Medical History  Diagnosis Date  . GERD (gastroesophageal reflux disease)   . 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 Knee injury   . Depression   . Seasonal allergies     takes Zytrec  . Fibroid uterus     size of a dime  . Umbilical hernia     watching , no plans for surgery at present  . H/O hiatal hernia   . Hypertension     "went away when I stopped smoking"  . Anxiety   . Migraine     "maybe couple times/month" (03/20/2015)  . Arthritis     "knees" (03/20/2015)  . Osteoarthritis of right knee 08/30/2013  . Osteoarthritis of left knee 12/02/2013  . Myasthenia gravis ([place]) 2017  . E. coli UTI 04/07/2015    Past Surgical History  Procedure Laterality Date  . Cesarean section  1987; 1989  . Dilation and curettage of uterus    . Tubal ligation  1989  . Vaginal hysterectomy  1990's?    "apparently took out one of my ovaries at the time too cause one's missing"  . Partial knee arthroplasty Right 08/30/2013    Procedure: RIGHT UNICOMPARTMENTAL KNEE;  Surgeon: Johnny Bridge, MD;  Location: [facility];  Service: Orthopedics;  Laterality: Right;  . Colonoscopy with propofol N/A 09/19/2013    Procedure: COLONOSCOPY WITH PROPOFOL;  Surgeon: Ladene Artist, MD;  Location: [facility];  Service: Endoscopy;  Laterality: N/A;  . Partial knee arthroplasty Left 12/02/2013    Procedure: LEFT KNEE UNI ARTHROPLASTY;  Surgeon: Johnny Bridge, MD;  Location: [facility];  Service: Orthopedics;  Laterality: Left;  . Esophagogastroduodenoscopy (egd) with propofol N/A 03/21/2015    Procedure: ESOPHAGOGASTRODUODENOSCOPY (EGD) WITH PROPOFOL;  Surgeon: Gatha Mayer,  MD;  Location: [facility];  Service: Endoscopy;  Laterality: N/A;    Family History: Family History  Problem Relation Age of Onset  . Heart disease Mother 2  . Hypertension Mother   . Stroke Father   . Hypertension Father   . Colon cancer Neg Hx   . Other Sister     Esophageal strictures  . Anemia Sister     Social History:   reports that she quit smoking about 3 years ago. Her smoking use included Cigarettes. She has a 20 pack-year smoking history. She has never used smokeless tobacco. She reports that she does not drink alcohol or use illicit drugs.  Allergies:  Allergies  Allergen Reactions  . Dicyclomine Hives  . Methocarbamol Hives  . Penicillins Hives  Has patient had a PCN reaction causing immediate rash, facial/tongue/throat swelling, SOB or lightheadedness with hypotension: Yes Has patient had a PCN reaction causing severe rash involving mucus membranes or skin necrosis: No Has patient had a PCN reaction that required hospitalization No Has patient had a PCN reaction occurring within the last 10 years: No If all of the above answers are "NO", then may proceed with Cephalosporin use.   . Ciprofloxacin     Rash, shortness of breath  . Shrimp [Shellfish Allergy]      Medications:                                                                                                                         Current facility-administered medications:  .  albuterol (PROVENTIL HFA;VENTOLIN HFA) 108 (90 Base) MCG/ACT inhaler 2 puff, 2 puff, Inhalation, Q4H PRN, Rogue Bussing, MD .  antiseptic oral rinse solution (CORINZ), 7 mL, Mouth Rinse, QID, Rush Farmer, MD, 7 mL at 05/17/15 0424 .  chlorhexidine gluconate (PERIDEX) 0.12 % solution 15 mL, 15 mL, Mouth Rinse, BID, Rush Farmer, MD, 15 mL at 05/17/15 0830 .  dexmedetomidine (PRECEDEX) 200 MCG/50ML (4 mcg/mL) infusion, 0.4-1.2 mcg/kg/hr, Intravenous, Continuous, Erick Colace, NP, Stopped at 05/17/15 0300 .   dextrose 5 %-0.45 % sodium chloride infusion, , Intravenous, Continuous, Rogue Bussing, MD, Last Rate: 125 mL/hr at 05/17/15 0700 .  docusate (COLACE) 50 MG/5ML liquid 100 mg, 100 mg, Per Tube, BID PRN, Erick Colace, NP .  enoxaparin (LOVENOX) injection 60 mg, 60 mg, Subcutaneous, Q24H, Rogue Bussing, MD, 60 mg at 05/16/15 1751 .  feeding supplement (VITAL HIGH PROTEIN) liquid 1,000 mL, 1,000 mL, Per Tube, Q24H, Erick Colace, NP, 1,000 mL at 05/16/15 1541 .  fentaNYL (SUBLIMAZE) injection 100 mcg, 100 mcg, Intravenous, Q15 min PRN, Erick Colace, NP .  fentaNYL (SUBLIMAZE) injection 100 mcg, 100 mcg, Intravenous, Q2H PRN, Erick Colace, NP, 100 mcg at 05/16/15 2135 .  Immune Globulin 10% (OCTAGAM) IV infusion 50 g, 400 mg/kg, Intravenous, Q24 Hr x 5, Marliss Coots, PA-C, Last Rate: 70.8 mL/hr at 05/16/15 1810, 50 g at 05/16/15 1810 .  insulin aspart (novoLOG) injection 0-9 Units, 0-9 Units, Subcutaneous, 6 times per day, Rogue Bussing, MD, 2 Units at 05/17/15 0423 .  iohexol (OMNIPAQUE) 350 MG/ML injection 100 mL, 100 mL, Intravenous, Once PRN, Marliss Coots, PA-C .  midazolam (VERSED) injection 2 mg, 2 mg, Intravenous, Q15 min PRN, Erick Colace, NP .  midazolam (VERSED) injection 2 mg, 2 mg, Intravenous, Q2H PRN, Erick Colace, NP, 2 mg at 05/16/15 2140 .  norepinephrine (LEVOPHED) 4 mg in dextrose 5 % 250 mL (0.016 mg/mL) infusion, 0-50 mcg/min, Intravenous, Titrated, Anders Simmonds, MD, Stopped at 05/17/15 0501 .  pantoprazole (PROTONIX) injection 40 mg, 40 mg, Intravenous, Q24H, Rogue Bussing, MD, 40 mg at 05/16/15 2111 .  pyridostigmine (MESTINON) tablet 60 mg, 60 mg, Per  Tube, 3 times per day, Erick Colace, NP, 60 mg at 05/17/15 0604 .  sodium chloride flush (NS) 0.9 % injection 3 mL, 3 mL, Intravenous, Q12H, Rogue Bussing, MD, 3 mL at 05/16/15 2115   Neurologic Examination:                                                                                                      Today's Vitals   05/17/15 0630 05/17/15 0700 05/17/15 0800 05/17/15 0900  BP: 98/58 120/89 102/65 130/82  Pulse: 59 78 55 92  Temp:   97.7 F (36.5 C)   TempSrc:   Axillary   Resp: '7 26 25 28  ' Height:      Weight:      SpO2: 100% 100% 100% 100%  PainSc:        Evaluation of higher integrative functions including: Level of alertness: Alert,  Oriented to time, place and person Speech :intubated Test the following cranial nerves: 2-12 grossly intact Motor examination: Normal tone, bulk, full 5/5 motor strength in all 4 extremities Examination of sensation : Normal and symmetric sensation to pinprick in all 4 extremities and on face Examination of deep tendon reflexes: 1+, normal and symmetric in all extremities, normal plantars bilaterally Test coordination: Normal finger nose testing,   Lab Results: Basic Metabolic Panel:  Recent Labs Lab 05/15/15 2037  NA 142  K 3.6  CL 104  CO2 27  GLUCOSE 107*  BUN 7  CREATININE 0.69  CALCIUM 9.4    Liver Function Tests:  Recent Labs Lab 05/15/15 2037  AST 29  ALT 20  ALKPHOS 81  BILITOT 0.5  PROT 7.5  ALBUMIN 3.6    Recent Labs Lab 05/15/15 2037  LIPASE 36   No results for input(s): AMMONIA in the last 168 hours.  CBC:  Recent Labs Lab 05/15/15 2037  WBC 10.0  NEUTROABS 8.1*  HGB 11.7*  HCT 38.5  MCV 84.2  PLT 382    Cardiac Enzymes: No results for input(s): CKTOTAL, CKMB, CKMBINDEX, TROPONINI in the last 168 hours.  Lipid Panel: No results for input(s): CHOL, TRIG, HDL, CHOLHDL, VLDL, LDLCALC in the last 168 hours.  CBG:  Recent Labs Lab 05/16/15 1542 05/16/15 1930 05/16/15 2310 05/17/15 0344 05/17/15 0737  GLUCAP 196* 146* 143* 165* 119*    Microbiology: Results for orders placed or performed during the hospital encounter of 05/15/15  MRSA culture     Status: None (Preliminary result)   Collection Time: 05/16/15  1:45 PM  Result Value  Ref Range Status   Specimen Description NASOPHARYNGEAL  Final   Special Requests NONE  Final   Culture   Final    NO GROWTH 1 DAY Performed at [facility]    Report Status PENDING  Incomplete    Imaging: Dg Chest 2 View  05/15/2015  CLINICAL DATA:  Shortness of breath. EXAM: CHEST  2 VIEW COMPARISON:  April 07, 2015. FINDINGS: The heart size and mediastinal contours are within normal limits. Hypoinflation of the lungs is noted. Mild bibasilar subsegmental atelectasis is noted.  No pneumothorax or pleural effusion is noted. The visualized skeletal structures are unremarkable. IMPRESSION: Hypoinflation of the lungs with mild bibasilar subsegmental atelectasis. Electronically Signed   By: Marijo Conception, M.D.   On: 05/15/2015 21:06   Nm Pet Image Initial (pi) Skull Base To Thigh  05/14/2015  CLINICAL DATA:  Initial treatment strategy for lymphadenopathy and splenomegaly. Possibly lymphoproliferative disease. Myasthenia gravis. EXAM: NUCLEAR MEDICINE PET SKULL BASE TO THIGH TECHNIQUE: 14.8 mCi F-18 FDG was injected intravenously. Full-ring PET imaging was performed from the skull base to thigh after the radiotracer. CT data was obtained and used for attenuation correction and anatomic localization. FASTING BLOOD GLUCOSE:  Value: 97 mg/dl COMPARISON:  Chest CTA on 04/07/2015 FINDINGS: NECK No hypermetabolic lymph nodes in the neck. CHEST No hypermetabolic mediastinal or hilar nodes. No suspicious pulmonary nodules on the CT scan. ABDOMEN/PELVIS No abnormal hypermetabolic activity within the liver, pancreas, adrenal glands, or spleen. No evidence of splenomegaly. No hypermetabolic lymph nodes in the abdomen or pelvis. SKELETON No focal hypermetabolic activity to suggest skeletal metastasis. Mild diffusely increased hypermetabolic activity seen throughout the axial and appendicular bone marrow. IMPRESSION: No hypermetabolic masses or lymphadenopathy identified. Mild diffusely increased metabolic  activity throughout the bone marrow, without focal metastatic lesions. This finding is of uncertain clinical significance, and is likely due to marrow stimulation. Bone marrow biopsy could be considered further evaluation if clinically warranted. Electronically Signed   By: Earle Gell M.D.   On: 05/14/2015 16:33   Dg Chest Portable 1 View  05/16/2015  CLINICAL DATA:  Status post central line placement EXAM: PORTABLE CHEST 1 VIEW COMPARISON:  05/15/2015 FINDINGS: Cardiac shadow is stable. An endotracheal tube is now seen and lies just above the carina and should be withdrawn 1-2 cm. A nasogastric catheter is seen coiled within the stomach. A right jugular central line is noted at the cavoatrial junction. No pneumothorax is seen. Bilateral pleural effusions are noted with bibasilar patchy opacities. No bony abnormality is seen. IMPRESSION: Endotracheal tube at the level of the carina and should be withdrawn 1-2 cm. No pneumothorax following central line placement. Small bilateral pleural effusions and bibasilar patchy opacities. These results will be called to the ordering clinician or representative by the Radiologist Assistant, and communication documented in the PACS or zVision Dashboard. Electronically Signed   By: Inez Catalina M.D.   On: 05/16/2015 13:35   Dg Abd Portable 1v  05/16/2015  CLINICAL DATA:  OG tube placement EXAM: PORTABLE ABDOMEN - 1 VIEW COMPARISON:  None. FINDINGS: Enteric tube is in place with the tip in the very pyloric region and the side port in the distal stomach. Nonobstructive bowel gas pattern. IMPRESSION: Enteric tube tip in the peripyloric region. Electronically Signed   By: Rolm Baptise M.D.   On: 05/16/2015 13:34    Assessment and plan:   LINSAY VOGT is an 55 y.o. female patient with MG exacerbation requiring intubation due to no being able to clear secretions. Currently back on her Mestinon of 60 mg TID. Will receive second dose of IVIG today. Will continue to follow  with you and make Mestinon adjustments after extubated or prior if not able to wean off vent.

## 2015-05-17 NOTE — Progress Notes (Addendum)
PULMONARY / CRITICAL CARE MEDICINE   Name: Frances Medina MRN: [id number] DOB: May 18, 1960    ADMISSION DATE:  05/15/2015 CONSULTATION DATE: 05/16/2015  REFERRING MD:   CHIEF COMPLAINT: Weakness, dysphagia, SOB in setting of known MG   HISTORY OF PRESENT ILLNESS:  55 y.o. female presenting to ED on 3/14 with increased weakness, SOB, and dysphagia over the last 7 d PTA.. Of note, she was recently diagnosed with myasthenia gravis during hospitalization in January 2017, during which time she required treatment with IVIG and was started on pyridostigmine. She had another hospitalization for MG flare in February, also requiring treatment with IVIG and steroid taper, thought to have been triggered by a UTI. She was supposed to have continued prednisone upon last hospital discharge 04/13/15, but patient was not aware that she had a prescription. She called her neurologist Dr. Audelia Acton last Friday about her dysphagia, and he recommended increasing frequency of pyridostigmine 60 mg to q6h from q8h. He also noted that patient would need to start steroids or be seen in the ED. Patient had an appointment to see him 05/16/15 with plans to start prednisone, but with increasing fatigue, she decided to be evaluated in the ED. Swallowing has become more difficult since last Friday, 05/11/15. She was only able to eat "a few nuggets" today. She was, however, able to take all but her evening dose of pyridostigmine. She has been able to drink fluids but not as much as usual. She and her husband report that she has trouble with her speech if she talks too much. She also complains of chest pain under her breast that feels like "it's swollen" and cuts off her breath since yesterday. She has had rhinorrhea with post-nasal drip and cough in the middle of the night since last weak but has not taken any new OTC medications. No sick contacts.In the ED on 3/14, patient's vital signs were stable, and SpO2 was 99% on room air. She was to be  admitted to the ICU. Since presentation her swallowing fxn declined further w/ difficulty swallowing oral secretions. A NIF was recorded at -25, and her VC was 540ml. PCCM was consulted given concern for impending respiratory failure.   SUBJECTIVE:  Intubated 3/15 On IVIg day 2, pyridostigmine, no steroids  VITAL SIGNS: BP 130/82 mmHg  Pulse 92  Temp(Src) 97.7 F (36.5 C) (Axillary)  Resp 28  Ht 5' (1.524 m)  Wt 125.9 kg (277 lb 9 oz)  BMI 54.21 kg/m2  SpO2 100%  HEMODYNAMICS: CVP:  [9 mmHg-24 mmHg] 14 mmHg  VENTILATOR SETTINGS: Vent Mode:  [-] PRVC FiO2 (%):  [50 %-100 %] 50 % Set Rate:  [18 bmp-28 bmp] 28 bmp Vt Set:  [370 mL] 370 mL PEEP:  [5 cmH20-10 cmH20] 10 cmH20 Plateau Pressure:  [22 cmH20-31 cmH20] 22 cmH20  INTAKE / OUTPUT: I/O last 3 completed shifts: In: 5295.7 [I.V.:3849.4; NG/GT:446.3; IV Piggyback:1000] Out: 66 [Urine:185]  PHYSICAL EXAMINATION: General:  55 year old aaf, awake, ventilated Neuro:  Awake, bilateral eye droop, interacts, nods to questions, moves all extremities, 4 out of 5 upper strength HEENT:  ETT in place, OP clear,  Cardiovascular:  rrr w/out MRG Lungs:  Clear w/out accessory muscle use.  Abdomen:  Soft, no OM, + bowel sounds Musculoskeletal:  Equal st and bulk Skin:  Warm and dry   LABS:  BMET  Recent Labs Lab 05/15/15 2037  NA 142  K 3.6  CL 104  CO2 27  BUN 7  CREATININE 0.69  GLUCOSE 107*    Electrolytes  Recent Labs Lab 05/15/15 2037  CALCIUM 9.4    CBC  Recent Labs Lab 05/15/15 2037  WBC 10.0  HGB 11.7*  HCT 38.5  PLT 382    Coag's No results for input(s): APTT, INR in the last 168 hours.  Sepsis Markers  Recent Labs Lab 05/16/15 1255 05/17/15 0500  PROCALCITON <0.10 <0.10    ABG  Recent Labs Lab 05/16/15 1249 05/16/15 1836  PHART 7.298* 7.393  PCO2ART 59.4* 44.2  PO2ART 115.0* 124.0*    Liver Enzymes  Recent Labs Lab 05/15/15 2037  AST 29  ALT 20  ALKPHOS 81  BILITOT  0.5  ALBUMIN 3.6    Cardiac Enzymes No results for input(s): TROPONINI, PROBNP in the last 168 hours.  Glucose  Recent Labs Lab 05/16/15 0739 05/16/15 1542 05/16/15 1930 05/16/15 2310 05/17/15 0344 05/17/15 0737  GLUCAP 189* 196* 146* 143* 165* 119*    Imaging Dg Chest Portable 1 View  05/16/2015  CLINICAL DATA:  Status post central line placement EXAM: PORTABLE CHEST 1 VIEW COMPARISON:  05/15/2015 FINDINGS: Cardiac shadow is stable. An endotracheal tube is now seen and lies just above the carina and should be withdrawn 1-2 cm. A nasogastric catheter is seen coiled within the stomach. A right jugular central line is noted at the cavoatrial junction. No pneumothorax is seen. Bilateral pleural effusions are noted with bibasilar patchy opacities. No bony abnormality is seen. IMPRESSION: Endotracheal tube at the level of the carina and should be withdrawn 1-2 cm. No pneumothorax following central line placement. Small bilateral pleural effusions and bibasilar patchy opacities. These results will be called to the ordering clinician or representative by the Radiologist Assistant, and communication documented in the PACS or zVision Dashboard. Electronically Signed   By: Inez Catalina M.D.   On: 05/16/2015 13:35   Dg Abd Portable 1v  05/16/2015  CLINICAL DATA:  OG tube placement EXAM: PORTABLE ABDOMEN - 1 VIEW COMPARISON:  None. FINDINGS: Enteric tube is in place with the tip in the very pyloric region and the side port in the distal stomach. Nonobstructive bowel gas pattern. IMPRESSION: Enteric tube tip in the peripyloric region. Electronically Signed   By: Rolm Baptise M.D.   On: 05/16/2015 13:34     STUDIES:  CXR 3/14 >> Hypoinflation of the lungs with mild bibasilar subsegmental atelectasis   CULTURES: Urinalysis 3/14 >> Positive nitrites with trace leukocytes  Urine 3/14 >>  ANTIBIOTICS:   SIGNIFICANT EVENTS: 2/10 >> D/C from hospital  3/13 >> Onset of severe SOB and weakness   3/14 >> Present to ED  3/15 >> IVIG and ETT   LINES/TUBES: ETT 3/15 >> CVC 3/15 >>   DISCUSSION: 55 year old female presenting to the ED on 3/14 with increased SOB and weakness. Was diagnosed with MG in January 2017. Discharged on 2/10 with prednisone, patient states that she was unaware of the prescription and had not been taking. Neurology and PCCM consulted. PCCM to admit. Due to patient severe dysphagia, decreased NIF and VC and inability to protect airway patient will be intubated, after which IVIG will be started and home mestinon will be restarted via NG/OG.   ASSESSMENT / PLAN:  PULMONARY A: Acute respiratory failure due to flare of MG P:   Intubated for airway protection Full Vent support, wean PEEP to 8, decrease RR to 14 and let her set her own rate.  PAD protocol  Clinical suspicion for acute PE low in this setting >>  d/c CT-PA that was ordered on presentation.  Follow CXR Continue home albuterol prn Assess NIF, Vt's, performance on PSV daily.   CARDIOVASCULAR A:  H/O HTN - on no home medications  P:  Telemetry Monitoring   RENAL A:   No acute problems  P:   Trend renal function panel  Electrolyte replacement as needed   GASTROINTESTINAL A:   Dysphagia  H/O GERD  P:   PPI IV BID Swallow Eval after extubation  NG/OG tube insertion for medications  Consult nutrition for tube feeding recommendations   HEMATOLOGIC A:   Anemia  P:  Trend CBC  Keep Hemoglobin greater 7  INFECTIOUS A:   Recent UTI P:   Trend WBC and Fever curve   ENDOCRINE A:   Hyperglycemia    P:   SSI  Q4 CBG  NEUROLOGIC A:   Myasthenia Gravis exacerbation  P:   Neurology following  IVIg Continue home mestinon   RASS goal: 0   FAMILY  - Updates: pending   - Inter-disciplinary family meet or Palliative Care meeting due by:  3/22  Independent CC time 35 minutes   Baltazar Apo, MD, PhD 05/17/2015, 11:15 AM  Pulmonary and Critical Care [phone] or if  no answer [phone]

## 2015-05-17 NOTE — Progress Notes (Signed)
FPTS Social Note  Spoke with patient and her daughter York Cerise this morning. Answered questions about imaging done thus far and respiratory measures that are being monitored. Patient has questions about whether she needs more work-up for type of Myasthenia Gravis she has.   Appreciate the wonderful care CCM and Neurology are providing. Will continue to follow along until Family Medicine can resume care.   Desiree provided cell phone number [phone] for any big updates.   Rogue Bussing, MD 05/17/2015, 9:16 AM PGY-1, [place] [facility] Service pager [phone]

## 2015-05-17 NOTE — Progress Notes (Signed)
PT Cancellation Note  Patient Details Name: Frances Medina MRN: [id number] DOB: 1961/02/21   Cancelled Treatment:    Reason Eval/Treat Not Completed: Patient not medically ready.  Spoke with RN who indicates pt is not appropriate for PT at this time.  Will check back tomorrow.     Catarina Hartshorn, [place] 05/17/2015, 10:50 AM

## 2015-05-17 NOTE — Progress Notes (Signed)
OT Cancellation Note  Patient Details Name: Frances Medina MRN: [id number] DOB: 1960/05/24   Cancelled Treatment:    Reason Eval/Treat Not Completed: Patient not medically ready - Will reattempt   Darlina Rumpf [place], OTR/L [id number]  05/17/2015, 2:34 PM

## 2015-05-17 NOTE — Progress Notes (Signed)
Initial Nutrition Assessment  DOCUMENTATION CODES:   Morbid obesity  INTERVENTION:  -Decrease Vital High Protein to goal rate of 35 ml/hr  -Provide 30 ml Prostat TID Provides 1140 kcal, 118 grams of protein, 702 ml of free water  -Provide MVI daily   NUTRITION DIAGNOSIS:   Inadequate oral intake related to inability to eat as evidenced by NPO status.  GOAL:   Provide needs based on ASPEN/SCCM guidelines  MONITOR:   TF tolerance, Labs, Vent status, Weight trends  REASON FOR ASSESSMENT:   Consult Enteral/tube feeding initiation and management  ASSESSMENT:   Pt presenting with myasthenia gravis exacerbation. PMH is significant for HTN,uterine fibroids, depression, anxiety, migraines and recently diagnosed myasthenia gravis, and undergoing work-up of splenomegaly with lymphadenopathy.  3/15-OG tube placed, tip in peripyloric region  3/14-Intubated, respiratory insufficieny   Pt is currently on ventilator support.  MV: 8.7 L/min Temp: 36.5 C Propofol: None  Pt alert during time of visit. Pt's family at bedside. Family reports usual meal patterns PTA. Pt reports her usual body weight is 270 lb, reports no recent weight loss.   Conducted nutrition focused physical exam, identified no muscle wasting, no fat wasting, no edema present.   Labs reviewed. Medications reviewed.   Diet Order:  Diet NPO time specified  Skin:  Reviewed, no issues  Last BM:  PTA  Height:   Ht Readings from Last 1 Encounters:  05/16/15 5' (1.524 m)    Weight:   Wt Readings from Last 1 Encounters:  05/17/15 277 lb 9 oz (125.9 kg)    Ideal Body Weight:  45.4 kg  BMI:  Body mass index is 54.21 kg/(m^2).  Estimated Nutritional Needs:   Kcal:  1135  Protein:  113 grams  Fluid:  1.1 L  EDUCATION NEEDS:   No education needs identified at this time  [place], Dietetic Intern Pager: [phone]

## 2015-05-18 ENCOUNTER — Ambulatory Visit: Payer: BC Managed Care – PPO | Admitting: Hematology

## 2015-05-18 ENCOUNTER — Other Ambulatory Visit: Payer: BC Managed Care – PPO

## 2015-05-18 ENCOUNTER — Inpatient Hospital Stay (HOSPITAL_COMMUNITY): Payer: BC Managed Care – PPO

## 2015-05-18 LAB — BASIC METABOLIC PANEL
Anion gap: 8 (ref 5–15)
BUN: 10 mg/dL (ref 6–20)
CO2: 27 mmol/L (ref 22–32)
Calcium: 8.8 mg/dL — ABNORMAL LOW (ref 8.9–10.3)
Chloride: 105 mmol/L (ref 101–111)
Creatinine, Ser: 0.5 mg/dL (ref 0.44–1.00)
GFR calc Af Amer: >60 mL/min (ref >60)
GFR calc non Af Amer: >60 mL/min (ref >60)
Glucose, Bld: 112 mg/dL — ABNORMAL HIGH (ref 65–99)
Potassium: 3.4 mmol/L — ABNORMAL LOW (ref 3.5–5.1)
Sodium: 140 mmol/L (ref 135–145)

## 2015-05-18 LAB — CBC
HCT: 31.8 % — ABNORMAL LOW (ref 36.0–46.0)
Hemoglobin: 9.3 g/dL — ABNORMAL LOW (ref 12.0–15.0)
MCH: 24.7 pg — ABNORMAL LOW (ref 26.0–34.0)
MCHC: 29.2 g/dL — ABNORMAL LOW (ref 30.0–36.0)
MCV: 84.4 fL (ref 78.0–100.0)
Platelets: 297 10*3/uL (ref 150–400)
RBC: 3.77 MIL/uL — ABNORMAL LOW (ref 3.87–5.11)
RDW: 16.5 % — ABNORMAL HIGH (ref 11.5–15.5)
WBC: 8.9 10*3/uL (ref 4.0–10.5)

## 2015-05-18 LAB — GLUCOSE, CAPILLARY
Glucose-Capillary: 91 mg/dL (ref 65–99)
Glucose-Capillary: 106 mg/dL — ABNORMAL HIGH (ref 65–99)
Glucose-Capillary: 110 mg/dL — ABNORMAL HIGH (ref 65–99)
Glucose-Capillary: 128 mg/dL — ABNORMAL HIGH (ref 65–99)
Glucose-Capillary: 106 mg/dL — ABNORMAL HIGH (ref 65–99)

## 2015-05-18 LAB — URINE CULTURE: Culture: >=100000

## 2015-05-18 LAB — PROCALCITONIN: Procalcitonin: <0.1 ng/mL

## 2015-05-18 LAB — MRSA CULTURE

## 2015-05-18 MED ORDER — POTASSIUM CHLORIDE 20 MEQ PO PACK
40.0000 meq | PACK | Freq: Once | ORAL | Status: AC
  Administered 2015-05-18: 40 meq
  Filled 2015-05-18: qty 2

## 2015-05-18 NOTE — Progress Notes (Signed)
OT Cancellation Note  Patient Details Name: Frances Medina MRN: [id number] DOB: 09-08-1960   Cancelled Treatment:    Reason Eval/Treat Not Completed: Patient not medically ready - Pt is currently weaning with hope to extubate today per RN.  Will reattempt.   Darlina Rumpf [place], OTR/L [id number]  05/18/2015, 9:58 AM

## 2015-05-18 NOTE — Progress Notes (Signed)
SLP Cancellation Note  Patient Details Name: Frances Medina MRN: [id number] DOB: 08/30/1960   Cancelled treatment:       Reason Eval/Treat Not Completed: Patient not medically ready, remains intubated although with possibility of extubation today. Will continue to follow for readiness.   Germain Osgood, M.A. CCC-SLP [phone]  Germain Osgood 05/18/2015, 3:35 PM

## 2015-05-18 NOTE — Progress Notes (Signed)
nif -40, vc.6L best of three, good patient effort. RT will continue to monitor.

## 2015-05-18 NOTE — Progress Notes (Signed)
PULMONARY / CRITICAL CARE MEDICINE   Name: Frances Medina MRN: [id number] DOB: 26-Mar-1960    ADMISSION DATE:  05/15/2015 CONSULTATION DATE: 05/16/2015  REFERRING MD:   CHIEF COMPLAINT: Weakness, dysphagia, SOB in setting of known MG   HISTORY OF PRESENT ILLNESS:  55 y.o. female presenting to ED on 3/14 with increased weakness, SOB, and dysphagia over the last 7 d PTA.. Of note, she was recently diagnosed with myasthenia gravis during hospitalization in January 2017, during which time she required treatment with IVIG and was started on pyridostigmine. She had another hospitalization for MG flare in February, also requiring treatment with IVIG and steroid taper, thought to have been triggered by a UTI. She was supposed to have continued prednisone upon last hospital discharge 04/13/15, but patient was not aware that she had a prescription. She called her neurologist Dr. Audelia Acton last Friday about her dysphagia, and he recommended increasing frequency of pyridostigmine 60 mg to q6h from q8h. He also noted that patient would need to start steroids or be seen in the ED. Patient had an appointment to see him 05/16/15 with plans to start prednisone, but with increasing fatigue, she decided to be evaluated in the ED. Swallowing has become more difficult since last Friday, 05/11/15. She was only able to eat "a few nuggets" today. She was, however, able to take all but her evening dose of pyridostigmine. She has been able to drink fluids but not as much as usual. She and her husband report that she has trouble with her speech if she talks too much. She also complains of chest pain under her breast that feels like "it's swollen" and cuts off her breath since yesterday. She has had rhinorrhea with post-nasal drip and cough in the middle of the night since last weak but has not taken any new OTC medications. No sick contacts.In the ED on 3/14, patient's vital signs were stable, and SpO2 was 99% on room air. She was to be  admitted to the ICU. Since presentation her swallowing fxn declined further w/ difficulty swallowing oral secretions. A NIF was recorded at -25, and her VC was 557ml. PCCM was consulted given concern for impending respiratory failure.   SUBJECTIVE:  NIF improved to -40, FVC 0.6 On IVIg day 3, pyridostigmine, no steroids Some oral secretions, not requiring significant suctioning   VITAL SIGNS: BP 133/72 mmHg  Pulse 82  Temp(Src) 98 F (36.7 C) (Oral)  Resp 18  Ht 5' (1.524 m)  Wt 123.2 kg (271 lb 9.7 oz)  BMI 53.04 kg/m2  SpO2 100%  HEMODYNAMICS: CVP:  [12 mmHg-23 mmHg] 15 mmHg  VENTILATOR SETTINGS: Vent Mode:  [-] PSV FiO2 (%):  [40 %] 40 % Set Rate:  [14 bmp] 14 bmp Vt Set:  [370 mL] 370 mL PEEP:  [5 cmH20-8 cmH20] 5 cmH20 Pressure Support:  [5 cmH20] 5 cmH20 Plateau Pressure:  [19 cmH20-21 cmH20] 19 cmH20  INTAKE / OUTPUT: I/O last 3 completed shifts: In: 4693.6 [I.V.:3173.6; NG/GT:1520] Out: 2915 [Urine:2915]  PHYSICAL EXAMINATION: General:  54 year old aaf, awake, ventilated Neuro:  Awake, eyes more open, interacts, nods to questions, moves all extremities HEENT:  ETT in place, OP clear,  Cardiovascular:  rrr w/out MRG Lungs:  Clear w/out accessory muscle use.  Abdomen:  Soft, no OM, + bowel sounds Musculoskeletal:  Equal st and bulk Skin:  Warm and dry   LABS:  BMET  Recent Labs Lab 05/15/15 2037 05/18/15 0530  NA 142 140  K 3.6 3.4*  CL 104 105  CO2 27 27  BUN 7 10  CREATININE 0.69 0.50  GLUCOSE 107* 112*    Electrolytes  Recent Labs Lab 05/15/15 2037 05/18/15 0530  CALCIUM 9.4 8.8*    CBC  Recent Labs Lab 05/15/15 2037 05/18/15 0530  WBC 10.0 8.9  HGB 11.7* 9.3*  HCT 38.5 31.8*  PLT 382 297    Coag's No results for input(s): APTT, INR in the last 168 hours.  Sepsis Markers  Recent Labs Lab 05/16/15 1255 05/17/15 0500 05/18/15 0530  PROCALCITON <0.10 <0.10 <0.10    ABG  Recent Labs Lab 05/16/15 1249  05/16/15 1836  PHART 7.298* 7.393  PCO2ART 59.4* 44.2  PO2ART 115.0* 124.0*    Liver Enzymes  Recent Labs Lab 05/15/15 2037  AST 29  ALT 20  ALKPHOS 81  BILITOT 0.5  ALBUMIN 3.6    Cardiac Enzymes No results for input(s): TROPONINI, PROBNP in the last 168 hours.  Glucose  Recent Labs Lab 05/17/15 1149 05/17/15 1625 05/17/15 1930 05/17/15 2339 05/18/15 0337 05/18/15 0745  GLUCAP 120* 100* 110* 122* 91 106*    Imaging Dg Chest Port 1 View  05/18/2015  CLINICAL DATA:  Acute respiratory failure.  Endotracheal tube. EXAM: PORTABLE CHEST 1 VIEW COMPARISON:  05/16/2015 FINDINGS: Endotracheal tube terminates approximately 2.5 cm above the carina. Right jugular central venous catheter terminates over the cavoatrial junction. Enteric tube courses into the left upper abdomen with tip not imaged. Lung volumes remain diminished with persistent mild patchy opacities in both lung bases. Trace bilateral pleural effusions are questioned. No pneumothorax. IMPRESSION: 1. Support devices as above. 2. Low lung volumes with persistent mild bibasilar opacities which may reflect atelectasis. Electronically Signed   By: Logan Bores M.D.   On: 05/18/2015 08:21     STUDIES:  CXR 3/14 >> Hypoinflation of the lungs with mild bibasilar subsegmental atelectasis   CULTURES: Urinalysis 3/14 >> Positive nitrites with trace leukocytes  Urine 3/14 >>  ANTIBIOTICS:   SIGNIFICANT EVENTS: 2/10 >> D/C from hospital  3/13 >> Onset of severe SOB and weakness  3/14 >> Present to ED  3/15 >> IVIG and ETT   LINES/TUBES: ETT 3/15 >> CVC 3/15 >>   DISCUSSION: 55 year old female presenting to the ED on 3/14 with increased SOB and weakness. Was diagnosed with MG in January 2017. Discharged on 2/10 with prednisone, patient states that she was unaware of the prescription and had not been taking. Neurology and PCCM consulted. PCCM to admit. Due to patient severe dysphagia, decreased NIF and VC and  inability to protect airway patient will be intubated, after which IVIG will be started and home mestinon will be restarted via NG/OG.   ASSESSMENT / PLAN:  PULMONARY A: Acute respiratory failure due to flare of MG P:   Intubated for airway protection; NIF improved to -40, FVC still low at 0.6. Assessing on PSV, consider extubation.  PAD protocol  Clinical suspicion for acute PE low in this setting >> d/c'd CT-PA that was ordered on presentation.  Follow CXR Continue home albuterol prn  CARDIOVASCULAR A:  H/O HTN - on no home medications  P:  Telemetry Monitoring   RENAL A:   No acute problems  Hypokalemia  P:   Trend renal function panel  Electrolyte replacement as needed   GASTROINTESTINAL A:   Dysphagia  H/O GERD  P:   PPI Swallow Eval after extubation  NG/OG tube for medications  TF while intubated  HEMATOLOGIC A:  Anemia  P:  Trend CBC  Keep Hemoglobin greater 7  INFECTIOUS A:   Recent UTI P:   Trend WBC and Fever curve   ENDOCRINE A:   Hyperglycemia    P:   SSI  Q4 CBG  NEUROLOGIC A:   Myasthenia Gravis exacerbation  P:   Neurology following  IVIg day #3 Continue home mestinon   RASS goal: 0   FAMILY  - Updates: by RB 3/16 and 3/17  - Inter-disciplinary family meet or Palliative Care meeting due by:  3/22  Independent CC time 55 minutes   Baltazar Apo, MD, PhD 05/18/2015, 10:00 AM [facility] [phone] or if no answer [phone]

## 2015-05-18 NOTE — Progress Notes (Addendum)
Interval History:                                                                                                                      Frances Medina is an 55 y.o. female patient with MG admitted for MG exacerbation. Currently on the Vent and weaning off well. She is back on her daily dose of Mestinon.    Past Medical History: Past Medical History  Diagnosis Date  . GERD (gastroesophageal reflux disease)   . 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 Knee injury   . Depression   . Seasonal allergies     takes Zytrec  . Fibroid uterus     size of a dime  . Umbilical hernia     watching , no plans for surgery at present  . H/O hiatal hernia   . Hypertension     "went away when I stopped smoking"  . Anxiety   . Migraine     "maybe couple times/month" (03/20/2015)  . Arthritis     "knees" (03/20/2015)  . Osteoarthritis of right knee 08/30/2013  . Osteoarthritis of left knee 12/02/2013  . Myasthenia gravis ([place]) 2017  . E. coli UTI 04/07/2015    Past Surgical History  Procedure Laterality Date  . Cesarean section  1987; 1989  . Dilation and curettage of uterus    . Tubal ligation  1989  . Vaginal hysterectomy  1990's?    "apparently took out one of my ovaries at the time too cause one's missing"  . Partial knee arthroplasty Right 08/30/2013    Procedure: RIGHT UNICOMPARTMENTAL KNEE;  Surgeon: Johnny Bridge, MD;  Location: [place];  Service: Orthopedics;  Laterality: Right;  . Colonoscopy with propofol N/A 09/19/2013    Procedure: COLONOSCOPY WITH PROPOFOL;  Surgeon: Ladene Artist, MD;  Location: [facility];  Service: Endoscopy;  Laterality: N/A;  . Partial knee arthroplasty Left 12/02/2013    Procedure: LEFT KNEE UNI ARTHROPLASTY;  Surgeon: Johnny Bridge, MD;  Location: [place];  Service: Orthopedics;  Laterality: Left;  . Esophagogastroduodenoscopy (egd) with propofol N/A 03/21/2015    Procedure: ESOPHAGOGASTRODUODENOSCOPY (EGD) WITH PROPOFOL;  Surgeon: Gatha Mayer, MD;   Location: [place];  Service: Endoscopy;  Laterality: N/A;    Family History: Family History  Problem Relation Age of Onset  . Heart disease Mother 71  . Hypertension Mother   . Stroke Father   . Hypertension Father   . Colon cancer Neg Hx   . Other Sister     Esophageal strictures  . Anemia Sister     Social History:   reports that she quit smoking about 3 years ago. Her smoking use included Cigarettes. She has a 20 pack-year smoking history. She has never used smokeless tobacco. She reports that she does not drink alcohol or use illicit drugs.  Allergies:  Allergies  Allergen Reactions  . Dicyclomine Hives  . Methocarbamol Hives  . Penicillins Hives  Has patient had a PCN reaction causing immediate rash, facial/tongue/throat swelling, SOB or lightheadedness with hypotension: Yes Has patient had a PCN reaction causing severe rash involving mucus membranes or skin necrosis: No Has patient had a PCN reaction that required hospitalization No Has patient had a PCN reaction occurring within the last 10 years: No If all of the above answers are "NO", then may proceed with Cephalosporin use.   . Ciprofloxacin     Rash, shortness of breath  . Shrimp [Shellfish Allergy]      Medications:                                                                                                                         Current facility-administered medications:  .  albuterol (PROVENTIL HFA;VENTOLIN HFA) 108 (90 Base) MCG/ACT inhaler 2 puff, 2 puff, Inhalation, Q4H PRN, Rogue Bussing, MD .  antiseptic oral rinse solution (CORINZ), 7 mL, Mouth Rinse, QID, Rush Farmer, MD, 7 mL at 05/18/15 0500 .  chlorhexidine gluconate (PERIDEX) 0.12 % solution 15 mL, 15 mL, Mouth Rinse, BID, Rush Farmer, MD, 15 mL at 05/18/15 0834 .  dexmedetomidine (PRECEDEX) 200 MCG/50ML (4 mcg/mL) infusion, 0.4-1.2 mcg/kg/hr, Intravenous, Continuous, Erick Colace, NP, Stopped at 05/17/15 0300 .   dextrose 5 %-0.45 % sodium chloride infusion, , Intravenous, Continuous, Collene Gobble, MD, Last Rate: 50 mL/hr at 05/17/15 2000 .  docusate (COLACE) 50 MG/5ML liquid 100 mg, 100 mg, Per Tube, BID PRN, Erick Colace, NP .  enoxaparin (LOVENOX) injection 60 mg, 60 mg, Subcutaneous, Q24H, Rogue Bussing, MD, 60 mg at 05/17/15 1259 .  feeding supplement (PRO-STAT SUGAR FREE 64) liquid 30 mL, 30 mL, Per Tube, TID, Asencion Islam, RD, 30 mL at 05/17/15 2140 .  feeding supplement (VITAL HIGH PROTEIN) liquid 1,000 mL, 1,000 mL, Per Tube, Q24H, Asencion Islam, RD .  fentaNYL (SUBLIMAZE) injection 100 mcg, 100 mcg, Intravenous, Q15 min PRN, Erick Colace, NP, 100 mcg at 05/18/15 0017 .  fentaNYL (SUBLIMAZE) injection 100 mcg, 100 mcg, Intravenous, Q2H PRN, Erick Colace, NP, 100 mcg at 05/18/15 0533 .  Immune Globulin 10% (OCTAGAM) IV infusion 50 g, 400 mg/kg, Intravenous, Q24 Hr x 5, Marliss Coots, PA-C, Last Rate: 70.8 mL/hr at 05/16/15 1810, 50 g at 05/17/15 1520 .  insulin aspart (novoLOG) injection 0-9 Units, 0-9 Units, Subcutaneous, 6 times per day, Rogue Bussing, MD, 1 Units at 05/18/15 0018 .  iohexol (OMNIPAQUE) 350 MG/ML injection 100 mL, 100 mL, Intravenous, Once PRN, Marliss Coots, PA-C .  midazolam (VERSED) injection 2 mg, 2 mg, Intravenous, Q15 min PRN, Erick Colace, NP .  midazolam (VERSED) injection 2 mg, 2 mg, Intravenous, Q2H PRN, Erick Colace, NP, 2 mg at 05/18/15 9470 .  multivitamin with minerals tablet 1 tablet, 1 tablet, Oral, Daily, Asencion Islam, RD, 1 tablet at 05/17/15 1621 .  norepinephrine (LEVOPHED) 4 mg in dextrose 5 % 250  mL (0.016 mg/mL) infusion, 0-50 mcg/min, Intravenous, Titrated, Anders Simmonds, MD, Stopped at 05/17/15 0501 .  pantoprazole (PROTONIX) injection 40 mg, 40 mg, Intravenous, Q24H, Rogue Bussing, MD, 40 mg at 05/17/15 2140 .  pyridostigmine (MESTINON) tablet 60 mg, 60 mg, Per Tube, 3 times per day, Erick Colace,  NP, 60 mg at 05/18/15 0510 .  sodium chloride flush (NS) 0.9 % injection 3 mL, 3 mL, Intravenous, Q12H, Rogue Bussing, MD, 3 mL at 05/17/15 2140   Neurologic Examination:                                                                                                     Today's Vitals   05/18/15 0700 05/18/15 0741 05/18/15 0800 05/18/15 0900  BP: 145/86 176/88 147/75 133/72  Pulse: 111  101 82  Temp:   98 F (36.7 C)   TempSrc:   Oral   Resp: '21  17 18  ' Height:      Weight:      SpO2: 100% 100% 100% 100%  PainSc:        Evaluation of higher integrative functions including: Level of alertness: Alert,  Oriented to time, place and person Speech :intubated Test the following cranial nerves: 2-12 grossly intact Motor examination: Normal tone, bulk, full 5/5 motor strength in all 4 extremities Examination of sensation : Normal and symmetric sensation to pinprick in all 4 extremities and on face Examination of deep tendon reflexes: 1+, normal and symmetric in all extremities, normal plantars bilaterally Test coordination: Normal finger nose testing,   Lab Results: Basic Metabolic Panel:  Recent Labs Lab 05/15/15 2037 05/18/15 0530  NA 142 140  K 3.6 3.4*  CL 104 105  CO2 27 27  GLUCOSE 107* 112*  BUN 7 10  CREATININE 0.69 0.50  CALCIUM 9.4 8.8*    Liver Function Tests:  Recent Labs Lab 05/15/15 2037  AST 29  ALT 20  ALKPHOS 81  BILITOT 0.5  PROT 7.5  ALBUMIN 3.6    Recent Labs Lab 05/15/15 2037  LIPASE 36   No results for input(s): AMMONIA in the last 168 hours.  CBC:  Recent Labs Lab 05/15/15 2037 05/18/15 0530  WBC 10.0 8.9  NEUTROABS 8.1*  --   HGB 11.7* 9.3*  HCT 38.5 31.8*  MCV 84.2 84.4  PLT 382 297    Cardiac Enzymes: No results for input(s): CKTOTAL, CKMB, CKMBINDEX, TROPONINI in the last 168 hours.  Lipid Panel: No results for input(s): CHOL, TRIG, HDL, CHOLHDL, VLDL, LDLCALC in the last 168 hours.  CBG:  Recent  Labs Lab 05/17/15 1625 05/17/15 1930 05/17/15 2339 05/18/15 0337 05/18/15 0745  GLUCAP 100* 110* 122* 91 106*    Microbiology: Results for orders placed or performed during the hospital encounter of 05/15/15  Culture, Urine     Status: None (Preliminary result)   Collection Time: 05/15/15 10:30 PM  Result Value Ref Range Status   Specimen Description URINE, RANDOM  Final   Special Requests NONE  Final   Culture >=100,000 COLONIES/mL GRAM NEGATIVE RODS  Final  Report Status PENDING  Incomplete  MRSA culture     Status: None (Preliminary result)   Collection Time: 05/16/15  1:45 PM  Result Value Ref Range Status   Specimen Description NASOPHARYNGEAL  Final   Special Requests NONE  Final   Culture   Final    NO GROWTH 1 DAY Performed at [facility]    Report Status PENDING  Incomplete    Imaging: Dg Chest 2 View  05/15/2015  CLINICAL DATA:  Shortness of breath. EXAM: CHEST  2 VIEW COMPARISON:  April 07, 2015. FINDINGS: The heart size and mediastinal contours are within normal limits. Hypoinflation of the lungs is noted. Mild bibasilar subsegmental atelectasis is noted. No pneumothorax or pleural effusion is noted. The visualized skeletal structures are unremarkable. IMPRESSION: Hypoinflation of the lungs with mild bibasilar subsegmental atelectasis. Electronically Signed   By: Marijo Conception, M.D.   On: 05/15/2015 21:06   Nm Pet Image Initial (pi) Skull Base To Thigh  05/14/2015  CLINICAL DATA:  Initial treatment strategy for lymphadenopathy and splenomegaly. Possibly lymphoproliferative disease. Myasthenia gravis. EXAM: NUCLEAR MEDICINE PET SKULL BASE TO THIGH TECHNIQUE: 14.8 mCi F-18 FDG was injected intravenously. Full-ring PET imaging was performed from the skull base to thigh after the radiotracer. CT data was obtained and used for attenuation correction and anatomic localization. FASTING BLOOD GLUCOSE:  Value: 97 mg/dl COMPARISON:  Chest CTA on 04/07/2015 FINDINGS:  NECK No hypermetabolic lymph nodes in the neck. CHEST No hypermetabolic mediastinal or hilar nodes. No suspicious pulmonary nodules on the CT scan. ABDOMEN/PELVIS No abnormal hypermetabolic activity within the liver, pancreas, adrenal glands, or spleen. No evidence of splenomegaly. No hypermetabolic lymph nodes in the abdomen or pelvis. SKELETON No focal hypermetabolic activity to suggest skeletal metastasis. Mild diffusely increased hypermetabolic activity seen throughout the axial and appendicular bone marrow. IMPRESSION: No hypermetabolic masses or lymphadenopathy identified. Mild diffusely increased metabolic activity throughout the bone marrow, without focal metastatic lesions. This finding is of uncertain clinical significance, and is likely due to marrow stimulation. Bone marrow biopsy could be considered further evaluation if clinically warranted. Electronically Signed   By: Earle Gell M.D.   On: 05/14/2015 16:33   Dg Chest Port 1 View  05/18/2015  CLINICAL DATA:  Acute respiratory failure.  Endotracheal tube. EXAM: PORTABLE CHEST 1 VIEW COMPARISON:  05/16/2015 FINDINGS: Endotracheal tube terminates approximately 2.5 cm above the carina. Right jugular central venous catheter terminates over the cavoatrial junction. Enteric tube courses into the left upper abdomen with tip not imaged. Lung volumes remain diminished with persistent mild patchy opacities in both lung bases. Trace bilateral pleural effusions are questioned. No pneumothorax. IMPRESSION: 1. Support devices as above. 2. Low lung volumes with persistent mild bibasilar opacities which may reflect atelectasis. Electronically Signed   By: Logan Bores M.D.   On: 05/18/2015 08:21   Dg Chest Portable 1 View  05/16/2015  CLINICAL DATA:  Status post central line placement EXAM: PORTABLE CHEST 1 VIEW COMPARISON:  05/15/2015 FINDINGS: Cardiac shadow is stable. An endotracheal tube is now seen and lies just above the carina and should be withdrawn 1-2  cm. A nasogastric catheter is seen coiled within the stomach. A right jugular central line is noted at the cavoatrial junction. No pneumothorax is seen. Bilateral pleural effusions are noted with bibasilar patchy opacities. No bony abnormality is seen. IMPRESSION: Endotracheal tube at the level of the carina and should be withdrawn 1-2 cm. No pneumothorax following central line placement. Small bilateral pleural effusions  and bibasilar patchy opacities. These results will be called to the ordering clinician or representative by the Radiologist Assistant, and communication documented in the PACS or zVision Dashboard. Electronically Signed   By: Inez Catalina M.D.   On: 05/16/2015 13:35   Dg Abd Portable 1v  05/16/2015  CLINICAL DATA:  OG tube placement EXAM: PORTABLE ABDOMEN - 1 VIEW COMPARISON:  None. FINDINGS: Enteric tube is in place with the tip in the very pyloric region and the side port in the distal stomach. Nonobstructive bowel gas pattern. IMPRESSION: Enteric tube tip in the peripyloric region. Electronically Signed   By: Rolm Baptise M.D.   On: 05/16/2015 13:34    Assessment and plan:   Frances Medina is an 55 y.o. female patient with MG exacerbation requiring intubation due to no being able to clear secretions. Currently back on her Mestinon of 60 mg TID. Will receive third dose of IVIG today. Will continue to follow with you and make Mestinon adjustments after extubated or prior if not able to wean off vent.   Seen with Dr. Silverio Decamp. Please see his attestation note for A/P for any additional work up recommendations.

## 2015-05-18 NOTE — Progress Notes (Signed)
Marni Griffon NP instructed to insert foley catheter for closer output monitoring, due to patient change in condition requiring intubation, central line placement, sedation and IVIG infusions.

## 2015-05-18 NOTE — Progress Notes (Signed)
FPTS Social Note  Spoke with patient and her daughter Deseree this morning. Foley removed 05/17/15. Daughter says she was told patient has been breathing some on her own. Currently weaning off of vent and will receive third dose of IVIG this evening. Appreciate the wonderful care CCM and Neurology are providing. Will continue to follow along until Family Medicine can resume care.   Deseree provided cell phone number [phone] for any big updates.   Rogue Bussing, MD 05/18/2015, 9:51 AM PGY-1, [facility] pager [phone]

## 2015-05-18 NOTE — Progress Notes (Signed)
PT Cancellation Note  Patient Details Name: Frances Medina MRN: [id number] DOB: Jul 18, 1960   Cancelled Treatment:    Reason Eval/Treat Not Completed: Patient not medically ready.  Pt currently weaning and possibility of extubation later today.  Will hold and try back another time.     Catarina Hartshorn, [place] 05/18/2015, 11:22 AM

## 2015-05-18 NOTE — Procedures (Signed)
Pt is exteremly drowsy and unable to perform NIF and VC.

## 2015-05-19 ENCOUNTER — Inpatient Hospital Stay (HOSPITAL_COMMUNITY): Payer: BC Managed Care – PPO

## 2015-05-19 DIAGNOSIS — J9600 Acute respiratory failure, unspecified whether with hypoxia or hypercapnia: Secondary | ICD-10-CM | POA: Insufficient documentation

## 2015-05-19 DIAGNOSIS — J9620 Acute and chronic respiratory failure, unspecified whether with hypoxia or hypercapnia: Secondary | ICD-10-CM

## 2015-05-19 LAB — BASIC METABOLIC PANEL
Anion gap: 8 (ref 5–15)
BUN: 14 mg/dL (ref 6–20)
CO2: 30 mmol/L (ref 22–32)
Calcium: 8.9 mg/dL (ref 8.9–10.3)
Chloride: 101 mmol/L (ref 101–111)
Creatinine, Ser: 0.6 mg/dL (ref 0.44–1.00)
GFR calc Af Amer: >60 mL/min (ref >60)
GFR calc non Af Amer: >60 mL/min (ref >60)
Glucose, Bld: 116 mg/dL — ABNORMAL HIGH (ref 65–99)
Potassium: 3.3 mmol/L — ABNORMAL LOW (ref 3.5–5.1)
Sodium: 139 mmol/L (ref 135–145)

## 2015-05-19 LAB — POCT I-STAT 3, ART BLOOD GAS (G3+)
Acid-Base Excess: 5 mmol/L — ABNORMAL HIGH (ref 0.0–2.0)
Bicarbonate: 31.5 mEq/L — ABNORMAL HIGH (ref 20.0–24.0)
O2 Saturation: 97 %
Patient temperature: 98.6
TCO2: 33 mmol/L (ref 0–100)
pCO2 arterial: 57 mmHg — ABNORMAL HIGH (ref 35.0–45.0)
pH, Arterial: 7.35 (ref 7.350–7.450)
pO2, Arterial: 96 mmHg (ref 80.0–100.0)

## 2015-05-19 LAB — GLUCOSE, CAPILLARY
Glucose-Capillary: 115 mg/dL — ABNORMAL HIGH (ref 65–99)
Glucose-Capillary: 122 mg/dL — ABNORMAL HIGH (ref 65–99)
Glucose-Capillary: 111 mg/dL — ABNORMAL HIGH (ref 65–99)
Glucose-Capillary: 191 mg/dL — ABNORMAL HIGH (ref 65–99)
Glucose-Capillary: 157 mg/dL — ABNORMAL HIGH (ref 65–99)
Glucose-Capillary: 175 mg/dL — ABNORMAL HIGH (ref 65–99)
Glucose-Capillary: 142 mg/dL — ABNORMAL HIGH (ref 65–99)

## 2015-05-19 MED ORDER — PYRIDOSTIGMINE BROMIDE 60 MG PO TABS
60.0000 mg | ORAL_TABLET | Freq: Four times a day (QID) | ORAL | Status: DC
  Administered 2015-05-19 – 2015-06-08 (×75): 60 mg
  Filled 2015-05-19 (×80): qty 1

## 2015-05-19 MED ORDER — POTASSIUM CHLORIDE 20 MEQ/15ML (10%) PO SOLN
40.0000 meq | Freq: Once | ORAL | Status: AC
  Administered 2015-05-19: 40 meq
  Filled 2015-05-19: qty 30

## 2015-05-19 MED ORDER — DEXTROSE 5 % IV SOLN: 1.0000 g | INTRAVENOUS | Status: DC

## 2015-05-19 MED ORDER — SULFAMETHOXAZOLE-TRIMETHOPRIM 200-40 MG/5ML PO SUSP
20.0000 mL | Freq: Two times a day (BID) | ORAL | Status: AC
  Administered 2015-05-19 – 2015-05-21 (×6): 20 mL
  Filled 2015-05-19 (×8): qty 20

## 2015-05-19 MED ORDER — RACEPINEPHRINE HCL 2.25 % IN NEBU
0.5000 mL | INHALATION_SOLUTION | Freq: Once | RESPIRATORY_TRACT | Status: AC
  Administered 2015-05-19: 0.5 mL via RESPIRATORY_TRACT
  Filled 2015-05-19: qty 0.5

## 2015-05-19 MED ORDER — FAMOTIDINE 40 MG/5ML PO SUSR
20.0000 mg | Freq: Two times a day (BID) | ORAL | Status: DC
  Administered 2015-05-19 – 2015-05-22 (×7): 20 mg via ORAL
  Filled 2015-05-19 (×7): qty 2.5

## 2015-05-19 MED ORDER — METHYLPREDNISOLONE SODIUM SUCC 125 MG IJ SOLR
INTRAMUSCULAR | Status: AC
  Filled 2015-05-19: qty 2

## 2015-05-19 MED ORDER — SODIUM CHLORIDE 0.9 % IV SOLN
250.0000 mg | Freq: Four times a day (QID) | INTRAVENOUS | Status: AC
  Administered 2015-05-19 – 2015-05-20 (×4): 250 mg via INTRAVENOUS
  Filled 2015-05-19 (×4): qty 2

## 2015-05-19 MED ORDER — ETOMIDATE 2 MG/ML IV SOLN
20.0000 mg | Freq: Once | INTRAVENOUS | Status: AC
  Administered 2015-05-19: 20 mg via INTRAVENOUS

## 2015-05-19 MED ORDER — POTASSIUM CHLORIDE 20 MEQ PO PACK: 40.0000 meq | PACK | Freq: Once | ORAL | Status: DC

## 2015-05-19 MED ORDER — METHYLPREDNISOLONE SODIUM SUCC 125 MG IJ SOLR
60.0000 mg | Freq: Four times a day (QID) | INTRAMUSCULAR | Status: DC
  Administered 2015-05-19 (×2): 60 mg via INTRAVENOUS
  Filled 2015-05-19 (×2): qty 2

## 2015-05-19 MED ORDER — ALBUTEROL SULFATE (2.5 MG/3ML) 0.083% IN NEBU
INHALATION_SOLUTION | RESPIRATORY_TRACT | Status: AC
  Administered 2015-05-19: 2.5 mg
  Filled 2015-05-19: qty 3

## 2015-05-19 NOTE — Progress Notes (Signed)
Interval History:                                                                                                                      Frances Medina is an 55 y.o. female patient with  myasthenic crisis, intubated. She was extubated earlier today but due to stridor she was reintubated. She started on Solu-Medrol to help with pharyngeal edema causing the stridor. Otherwise she feels that she is getting stronger the headache, her history function including [place] in vital capacity been improving, no motor weakness in her limbs..   Past Medical History: Past Medical History  Diagnosis Date  . GERD (gastroesophageal reflux disease)   . 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 Knee injury   . Depression   . Seasonal allergies     takes Zytrec  . Fibroid uterus     size of a dime  . Umbilical hernia     watching , no plans for surgery at present  . H/O hiatal hernia   . Hypertension     "went away when I stopped smoking"  . Anxiety   . Migraine     "maybe couple times/month" (03/20/2015)  . Arthritis     "knees" (03/20/2015)  . Osteoarthritis of right knee 08/30/2013  . Osteoarthritis of left knee 12/02/2013  . Myasthenia gravis ([place]) 2017  . E. coli UTI 04/07/2015    Past Surgical History  Procedure Laterality Date  . Cesarean section  1987; 1989  . Dilation and curettage of uterus    . Tubal ligation  1989  . Vaginal hysterectomy  1990's?    "apparently took out one of my ovaries at the time too cause one's missing"  . Partial knee arthroplasty Right 08/30/2013    Procedure: RIGHT UNICOMPARTMENTAL KNEE;  Surgeon: Johnny Bridge, MD;  Location: [facility];  Service: Orthopedics;  Laterality: Right;  . Colonoscopy with propofol N/A 09/19/2013    Procedure: COLONOSCOPY WITH PROPOFOL;  Surgeon: Ladene Artist, MD;  Location: [facility];  Service: Endoscopy;  Laterality: N/A;  . Partial knee arthroplasty Left 12/02/2013    Procedure: LEFT KNEE UNI ARTHROPLASTY;  Surgeon: Johnny Bridge, MD;  Location:  [facility];  Service: Orthopedics;  Laterality: Left;  . Esophagogastroduodenoscopy (egd) with propofol N/A 03/21/2015    Procedure: ESOPHAGOGASTRODUODENOSCOPY (EGD) WITH PROPOFOL;  Surgeon: Gatha Mayer, MD;  Location: [facility];  Service: Endoscopy;  Laterality: N/A;    Family History: Family History  Problem Relation Age of Onset  . Heart disease Mother 41  . Hypertension Mother   . Stroke Father   . Hypertension Father   . Colon cancer Neg Hx   . Other Sister     Esophageal strictures  . Anemia Sister     Social History:   reports that she quit smoking about 3 years ago. Her smoking use included Cigarettes. She has a 20 pack-year smoking history. She has never used smokeless tobacco. She reports  that she does not drink alcohol or use illicit drugs.  Allergies:  Allergies  Allergen Reactions  . Dicyclomine Hives  . Methocarbamol Hives  . Penicillins Hives    Has patient had a PCN reaction causing immediate rash, facial/tongue/throat swelling, SOB or lightheadedness with hypotension: Yes Has patient had a PCN reaction causing severe rash involving mucus membranes or skin necrosis: No Has patient had a PCN reaction that required hospitalization No Has patient had a PCN reaction occurring within the last 10 years: No If all of the above answers are "NO", then may proceed with Cephalosporin use.   . Ciprofloxacin     Rash, shortness of breath  . Shrimp [Shellfish Allergy]      Medications:                                                                                                                         Current facility-administered medications:  .  albuterol (PROVENTIL HFA;VENTOLIN HFA) 108 (90 Base) MCG/ACT inhaler 2 puff, 2 puff, Inhalation, Q4H PRN, Rogue Bussing, MD .  antiseptic oral rinse solution (CORINZ), 7 mL, Mouth Rinse, QID, Rush Farmer, MD, 7 mL at 05/19/15 1526 .  chlorhexidine gluconate (PERIDEX) 0.12 % solution 15 mL, 15  mL, Mouth Rinse, BID, Rush Farmer, MD, 15 mL at 05/19/15 0742 .  dexmedetomidine (PRECEDEX) 200 MCG/50ML (4 mcg/mL) infusion, 0.4-1.2 mcg/kg/hr, Intravenous, Continuous, Erick Colace, NP, Stopped at 05/17/15 0300 .  dextrose 5 %-0.45 % sodium chloride infusion, , Intravenous, Continuous, Collene Gobble, MD, Last Rate: 50 mL/hr at 05/18/15 2000 .  docusate (COLACE) 50 MG/5ML liquid 100 mg, 100 mg, Per Tube, BID PRN, Erick Colace, NP .  enoxaparin (LOVENOX) injection 60 mg, 60 mg, Subcutaneous, Q24H, Rogue Bussing, MD, 60 mg at 05/19/15 1159 .  famotidine (PEPCID) 40 MG/5ML suspension 20 mg, 20 mg, Oral, BID, Machele Deihl Fuller Mandril, MD .  feeding supplement (PRO-STAT SUGAR FREE 64) liquid 30 mL, 30 mL, Per Tube, TID, Asencion Islam, RD, 30 mL at 05/19/15 1616 .  feeding supplement (VITAL HIGH PROTEIN) liquid 1,000 mL, 1,000 mL, Per Tube, Q24H, Heather C Pitts, RD, 1,000 mL at 05/19/15 1353 .  fentaNYL (SUBLIMAZE) injection 100 mcg, 100 mcg, Intravenous, Q15 min PRN, Erick Colace, NP, 100 mcg at 05/19/15 1028 .  fentaNYL (SUBLIMAZE) injection 100 mcg, 100 mcg, Intravenous, Q2H PRN, Erick Colace, NP, 100 mcg at 05/19/15 1617 .  Immune Globulin 10% (OCTAGAM) IV infusion 50 g, 400 mg/kg, Intravenous, Q24 Hr x 5, Marliss Coots, PA-C, Last Rate: 70.8 mL/hr at 05/19/15 1244, 50 g at 05/19/15 1244 .  insulin aspart (novoLOG) injection 0-9 Units, 0-9 Units, Subcutaneous, 6 times per day, Rogue Bussing, MD, 2 Units at 05/19/15 1612 .  iohexol (OMNIPAQUE) 350 MG/ML injection 100 mL, 100 mL, Intravenous, Once PRN, Marliss Coots, PA-C .  methylPREDNISolone sodium succinate (SOLU-MEDROL) 250 mg in sodium chloride 0.9 % 50 mL  IVPB, 250 mg, Intravenous, Q6H, Arnol Mcgibbon Fuller Mandril, MD .  midazolam (VERSED) injection 2 mg, 2 mg, Intravenous, Q15 min PRN, Erick Colace, NP, 2 mg at 05/19/15 1028 .  midazolam (VERSED) injection 2 mg, 2 mg, Intravenous, Q2H PRN, Erick Colace, NP, 2 mg at 05/19/15 1617 .  multivitamin with minerals tablet 1 tablet, 1 tablet, Oral, Daily, Asencion Islam, RD, 1 tablet at 05/19/15 0929 .  pantoprazole (PROTONIX) injection 40 mg, 40 mg, Intravenous, Q24H, Rogue Bussing, MD, 40 mg at 05/18/15 2100 .  pyridostigmine (MESTINON) tablet 60 mg, 60 mg, Per Tube, QID, Brannon Decaire Fuller Mandril, MD, 60 mg at 05/19/15 1832 .  sodium chloride flush (NS) 0.9 % injection 3 mL, 3 mL, Intravenous, Q12H, Rogue Bussing, MD, 3 mL at 05/19/15 0932 .  sulfamethoxazole-trimethoprim (BACTRIM,SEPTRA) 200-40 MG/5ML suspension 20 mL, 20 mL, Per Tube, Q12H, Collene Gobble, MD, 20 mL at 05/19/15 1355   Neurologic Examination:                                                                                                     Today's Vitals   05/19/15 1520 05/19/15 1600 05/19/15 1700 05/19/15 1800  BP: 123/71 126/74 113/71 147/78  Pulse: 79 85 87 94  Temp:  99.7 F (37.6 C)    TempSrc:  Axillary    Resp: 19 19 20 18   Height:      Weight:      SpO2: 100% 100% 100% 100%  PainSc:        Patient intubated, without sedation she is alert and follows commands appropriately able to demonstrate good strength in all 4 extremities without a drift.   Lab Results: Basic Metabolic Panel:  Recent Labs Lab 05/15/15 2037 05/18/15 0530 05/19/15 0553  NA 142 140 139  K 3.6 3.4* 3.3*  CL 104 105 101  CO2 27 27 30   GLUCOSE 107* 112* 116*  BUN 7 10 14   CREATININE 0.69 0.50 0.60  CALCIUM 9.4 8.8* 8.9    Liver Function Tests:  Recent Labs Lab 05/15/15 2037  AST 29  ALT 20  ALKPHOS 81  BILITOT 0.5  PROT 7.5  ALBUMIN 3.6    Recent Labs Lab 05/15/15 2037  LIPASE 36   No results for input(s): AMMONIA in the last 168 hours.  CBC:  Recent Labs Lab 05/15/15 2037 05/18/15 0530  WBC 10.0 8.9  NEUTROABS 8.1*  --   HGB 11.7* 9.3*  HCT 38.5 31.8*  MCV 84.2 84.4  PLT 382 297    Cardiac Enzymes: No results for  input(s): CKTOTAL, CKMB, CKMBINDEX, TROPONINI in the last 168 hours.  Lipid Panel: No results for input(s): CHOL, TRIG, HDL, CHOLHDL, VLDL, LDLCALC in the last 168 hours.  CBG:  Recent Labs Lab 05/18/15 2327 05/19/15 0348 05/19/15 0751 05/19/15 1142 05/19/15 1548  GLUCAP 115* 122* 111* 191* 157*    Microbiology: Results for orders placed or performed during the hospital encounter of 05/15/15  Culture, Urine     Status: None   Collection Time: 05/15/15 10:30  PM  Result Value Ref Range Status   Specimen Description URINE, RANDOM  Final   Special Requests NONE  Final   Culture >=100,000 COLONIES/mL ESCHERICHIA COLI  Final   Report Status 05/18/2015 FINAL  Final   Organism ID, Bacteria ESCHERICHIA COLI  Final      Susceptibility   Escherichia coli - MIC*    AMPICILLIN 4 SENSITIVE Sensitive     CEFAZOLIN <=4 SENSITIVE Sensitive     CEFTRIAXONE <=1 SENSITIVE Sensitive     CIPROFLOXACIN >=4 RESISTANT Resistant     GENTAMICIN <=1 SENSITIVE Sensitive     IMIPENEM <=0.25 SENSITIVE Sensitive     NITROFURANTOIN <=16 SENSITIVE Sensitive     TRIMETH/SULFA <=20 SENSITIVE Sensitive     AMPICILLIN/SULBACTAM 4 SENSITIVE Sensitive     PIP/TAZO <=4 SENSITIVE Sensitive     * >=100,000 COLONIES/mL ESCHERICHIA COLI  MRSA culture     Status: None   Collection Time: 05/16/15  1:45 PM  Result Value Ref Range Status   Specimen Description NASOPHARYNGEAL  Final   Special Requests NONE  Final   Culture NOMRSA Performed at [facility]   Final   Report Status 05/18/2015 FINAL  Final    Imaging: Dg Chest 2 View  05/15/2015  CLINICAL DATA:  Shortness of breath. EXAM: CHEST  2 VIEW COMPARISON:  April 07, 2015. FINDINGS: The heart size and mediastinal contours are within normal limits. Hypoinflation of the lungs is noted. Mild bibasilar subsegmental atelectasis is noted. No pneumothorax or pleural effusion is noted. The visualized skeletal structures are unremarkable. IMPRESSION:  Hypoinflation of the lungs with mild bibasilar subsegmental atelectasis. Electronically Signed   By: Marijo Conception, M.D.   On: 05/15/2015 21:06   Dg Chest Port 1 View  05/19/2015  CLINICAL DATA:  Hypoxia EXAM: PORTABLE CHEST 1 VIEW COMPARISON:  May 19, 2015 study obtained earlier in the day FINDINGS: Endotracheal tube tip is 9 mm above the carina. Central catheter tip is at the cavoatrial junction. Nasogastric tube tip and side port are below the diaphragm in the stomach. No pneumothorax. There is no edema or consolidation. Heart size and pulmonary vascularity are within normal limits. No adenopathy. No bone lesions apparent. IMPRESSION: Tube and catheter positions as described without pneumothorax. Note that the endotracheal tube tip is close to the carina. It may be prudent to withdrawal endotracheal tube approximately 3 cm. No edema or consolidation. These results will be called to the ordering clinician or representative by the Radiologist Assistant, and communication documented in the PACS or zVision Dashboard. Electronically Signed   By: Lowella Grip III M.D.   On: 05/19/2015 11:16   Dg Chest Port 1 View  05/19/2015  CLINICAL DATA:  Acute respiratory failure EXAM: PORTABLE CHEST 1 VIEW COMPARISON:  May 18, 2015 FINDINGS: The ETT is in good position. No other change in support apparatus. No pneumothorax. Bibasilar atelectasis, right greater than left. No other interval changes. IMPRESSION: The ETT is in good position.  No other change. Electronically Signed   By: Dorise Bullion III M.D   On: 05/19/2015 08:11   Dg Chest Port 1 View  05/18/2015  CLINICAL DATA:  Acute respiratory failure.  Endotracheal tube. EXAM: PORTABLE CHEST 1 VIEW COMPARISON:  05/16/2015 FINDINGS: Endotracheal tube terminates approximately 2.5 cm above the carina. Right jugular central venous catheter terminates over the cavoatrial junction. Enteric tube courses into the left upper abdomen with tip not imaged. Lung  volumes remain diminished with persistent mild patchy opacities in both  lung bases. Trace bilateral pleural effusions are questioned. No pneumothorax. IMPRESSION: 1. Support devices as above. 2. Low lung volumes with persistent mild bibasilar opacities which may reflect atelectasis. Electronically Signed   By: Logan Bores M.D.   On: 05/18/2015 08:21   Dg Chest Portable 1 View  05/16/2015  CLINICAL DATA:  Status post central line placement EXAM: PORTABLE CHEST 1 VIEW COMPARISON:  05/15/2015 FINDINGS: Cardiac shadow is stable. An endotracheal tube is now seen and lies just above the carina and should be withdrawn 1-2 cm. A nasogastric catheter is seen coiled within the stomach. A right jugular central line is noted at the cavoatrial junction. No pneumothorax is seen. Bilateral pleural effusions are noted with bibasilar patchy opacities. No bony abnormality is seen. IMPRESSION: Endotracheal tube at the level of the carina and should be withdrawn 1-2 cm. No pneumothorax following central line placement. Small bilateral pleural effusions and bibasilar patchy opacities. These results will be called to the ordering clinician or representative by the Radiologist Assistant, and communication documented in the PACS or zVision Dashboard. Electronically Signed   By: Inez Catalina M.D.   On: 05/16/2015 13:35   Dg Abd Portable 1v  05/19/2015  CLINICAL DATA:  Evaluate OG tube. EXAM: PORTABLE ABDOMEN - 1 VIEW COMPARISON:  May 16, 2015 FINDINGS: The OG tube terminates in the distal stomach. No other acute abnormalities identified. IMPRESSION: Appropriate placement of OG tube. Electronically Signed   By: Dorise Bullion III M.D   On: 05/19/2015 11:17   Dg Abd Portable 1v  05/16/2015  CLINICAL DATA:  OG tube placement EXAM: PORTABLE ABDOMEN - 1 VIEW COMPARISON:  None. FINDINGS: Enteric tube is in place with the tip in the very pyloric region and the side port in the distal stomach. Nonobstructive bowel gas pattern.  IMPRESSION: Enteric tube tip in the peripyloric region. Electronically Signed   By: Rolm Baptise M.D.   On: 05/16/2015 13:34    Assessment and plan:   Frances Medina is an 55 y.o. female patient admitted with myasthenic crisis, on IVIG. Although she was extubated this morning, she was reintubated due to stridor and restricted difficulty. Believed to be secondary to pharyngeal edema and she started on IV Solu-Medrol. I increased the dose Solu-Medrol further to 250 [place] grams every 6 hours. On Mestinon 60 mg 4 times a day. She is on IVIG.  Overall doing well from myasthenia standpoint.   We'll follow-up

## 2015-05-19 NOTE — Progress Notes (Signed)
Pt extubated and placed on 4L [place]. Approx 10 minutes after extubation patient began having strider in neck and inspiratory wheezing in upper lobes. Pt very anxious stating she could not breath, O2 stats at 98%. RT called to assess, they immediately came to room and gave the patient a neb, Dr Lamonte Sakai notified and at bedside. Afterwards pt stated she felt a little better. Her sats started to decrease in lower 90s so [employer] increased to 5L. Orders received for racemic epi and solumedrol. Medications given to pt and she continued to decrease her saturations to 82% during neb. Dr Lamonte Sakai notified and came to room for intubation. Pt tolerated procedure well. Afterwards when laying flat pt became bradycardic to 36, immediately sat up and pulse increased to 80. Dr Lamonte Sakai notified. Attempted to call pt husband but he was unavailable. Spoke with pts son Dejen on telephone with status update on failed extubation and reintubation. Will continue to monitor.

## 2015-05-19 NOTE — Progress Notes (Signed)
SLP Cancellation Note  Patient Details Name: [name] MRN: [id number] DOB: 1961/02/05   Cancelled treatment:        Pt reintubated, therefore not medically ready for BSE. SLP will f/u Monday.   Titus Mould 05/19/2015, 1:23 PM

## 2015-05-19 NOTE — Procedures (Signed)
Intubation Procedure Note Frances Medina [id number] [birth year]  Procedure: Intubation Indications: Respiratory insufficiency  Procedure Details Consent: Unable to obtain consent because of emergent medical necessity. Time Out: Verified patient identification, verified procedure, site/side was marked, verified correct patient position, special equipment/implants available, medications/allergies/relevent history reviewed, required imaging and test results available.  Performed  Maximum sterile technique was used including gloves and hand hygiene.  MAC and 3    Evaluation Hemodynamic Status: BP stable throughout; O2 sats: Fell prior to procedure to 70's, improved with bag-mask ventilation Patient's Current Condition: stable Complications: No apparent complications Patient did tolerate procedure well. Chest X-ray ordered to verify placement.  CXR: pending.  Baltazar Apo, MD, PhD 05/19/2015, 10:32 AM [facility] [phone] or if no answer [phone]

## 2015-05-19 NOTE — Progress Notes (Signed)
PULMONARY / CRITICAL CARE MEDICINE   Name: Frances Medina MRN: [id number] DOB: 1960/11/27    ADMISSION DATE:  05/15/2015 CONSULTATION DATE: 05/16/2015  REFERRING MD:   CHIEF COMPLAINT: Weakness, dysphagia, SOB in setting of known MG   HISTORY OF PRESENT ILLNESS:  55 y.o. female presenting to ED on 3/14 with increased weakness, SOB, and dysphagia over the last 7 d PTA.. Of note, she was recently diagnosed with myasthenia gravis during hospitalization in January 2017, during which time she required treatment with IVIG and was started on pyridostigmine. She had another hospitalization for MG flare in February, also requiring treatment with IVIG and steroid taper, thought to have been triggered by a UTI. She was supposed to have continued prednisone upon last hospital discharge 04/13/15, but patient was not aware that she had a prescription. She called her neurologist Dr. Audelia Acton last Friday about her dysphagia, and he recommended increasing frequency of pyridostigmine 60 mg to q6h from q8h. He also noted that patient would need to start steroids or be seen in the ED. Patient had an appointment to see him 05/16/15 with plans to start prednisone, but with increasing fatigue, she decided to be evaluated in the ED. Swallowing has become more difficult since last Friday, 05/11/15. She was only able to eat "a few nuggets" today. She was, however, able to take all but her evening dose of pyridostigmine. She has been able to drink fluids but not as much as usual. She and her husband report that she has trouble with her speech if she talks too much. She also complains of chest pain under her breast that feels like "it's swollen" and cuts off her breath since yesterday. She has had rhinorrhea with post-nasal drip and cough in the middle of the night since last weak but has not taken any new OTC medications. No sick contacts.In the ED on 3/14, patient's vital signs were stable, and SpO2 was 99% on room air. She was to be  admitted to the ICU. Since presentation her swallowing fxn declined further w/ difficulty swallowing oral secretions. A NIF was recorded at -25, and her VC was 576ml. PCCM was consulted given concern for impending respiratory failure.   SUBJECTIVE:  NIF -40, FVC 0.7 On IVIg day 4, pyridostigmine, no steroids  VITAL SIGNS: BP 147/115 mmHg  Pulse 106  Temp(Src) 99.5 F (37.5 C) (Axillary)  Resp 21  Ht 5' (1.524 m)  Wt 270 lb 8.1 oz (122.7 kg)  BMI 52.83 kg/m2  SpO2 100%  HEMODYNAMICS: CVP:  [15 mmHg-18 mmHg] 15 mmHg  VENTILATOR SETTINGS: Vent Mode:  [-] PSV FiO2 (%):  [40 %] 40 % Set Rate:  [14 bmp] 14 bmp Vt Set:  [370 mL] 370 mL PEEP:  [5 cmH20] 5 cmH20 Pressure Support:  [5 cmH20] 5 cmH20 Plateau Pressure:  [18 cmH20] 18 cmH20  INTAKE / OUTPUT: I/O last 3 completed shifts: In: 3364.3 [I.V.:1810; NG/GT:1554.3] Out: 650 [Urine:650]  PHYSICAL EXAMINATION: General:  55 year old aaf, awake, ventilated Neuro:  Awake, eyes more open, interacts, nods to questions, moves all extremities HEENT:  ETT in place, OP clear,  Cardiovascular:  rrr w/out MRG Lungs:  Clear w/out accessory muscle use.  Abdomen:  Soft, no OM, + bowel sounds Musculoskeletal:  Equal st and bulk Skin:  Warm and dry   LABS:  BMET  Recent Labs Lab 05/15/15 2037 05/18/15 0530 05/19/15 0553  NA 142 140 139  K 3.6 3.4* 3.3*  CL 104 105 101  CO2 27  27 30  BUN 7 10 14   CREATININE 0.69 0.50 0.60  GLUCOSE 107* 112* 116*    Electrolytes  Recent Labs Lab 05/15/15 2037 05/18/15 0530 05/19/15 0553  CALCIUM 9.4 8.8* 8.9    CBC  Recent Labs Lab 05/15/15 2037 05/18/15 0530  WBC 10.0 8.9  HGB 11.7* 9.3*  HCT 38.5 31.8*  PLT 382 297    Coag's No results for input(s): APTT, INR in the last 168 hours.  Sepsis Markers  Recent Labs Lab 05/16/15 1255 05/17/15 0500 05/18/15 0530  PROCALCITON <0.10 <0.10 <0.10    ABG  Recent Labs Lab 05/16/15 1249 05/16/15 1836  PHART 7.298*  7.393  PCO2ART 59.4* 44.2  PO2ART 115.0* 124.0*    Liver Enzymes  Recent Labs Lab 05/15/15 2037  AST 29  ALT 20  ALKPHOS 81  BILITOT 0.5  ALBUMIN 3.6    Cardiac Enzymes No results for input(s): TROPONINI, PROBNP in the last 168 hours.  Glucose  Recent Labs Lab 05/18/15 1159 05/18/15 1553 05/18/15 2005 05/18/15 2327 05/19/15 0348 05/19/15 0751  GLUCAP 110* 128* 106* 115* 122* 111*    Imaging Dg Chest Port 1 View  05/19/2015  CLINICAL DATA:  Acute respiratory failure EXAM: PORTABLE CHEST 1 VIEW COMPARISON:  May 18, 2015 FINDINGS: The ETT is in good position. No other change in support apparatus. No pneumothorax. Bibasilar atelectasis, right greater than left. No other interval changes. IMPRESSION: The ETT is in good position.  No other change. Electronically Signed   By: Dorise Bullion III M.D   On: 05/19/2015 08:11     STUDIES:  CXR 3/14 >> Hypoinflation of the lungs with mild bibasilar subsegmental atelectasis   CULTURES: Urinalysis 3/14 >> Positive nitrites with trace leukocytes  Urine 3/14 >>  ANTIBIOTICS:   SIGNIFICANT EVENTS: 2/10 >> D/C from hospital  3/13 >> Onset of severe SOB and weakness  3/14 >> Present to ED  3/15 >> IVIG and ETT   LINES/TUBES: ETT 3/15 >> CVC 3/15 >>   DISCUSSION: 55 year old female presenting to the ED on 3/14 with increased SOB and weakness. Was diagnosed with MG in January 2017. Discharged on 2/10 with prednisone, patient states that she was unaware of the prescription and had not been taking. Neurology and PCCM consulted. PCCM to admit. Due to patient severe dysphagia, decreased NIF and VC and inability to protect airway patient will be intubated, after which IVIG will be started and home mestinon will be restarted via NG/OG.   ASSESSMENT / PLAN:  PULMONARY A: Acute respiratory failure due to flare of MG P:   Intubated for airway protection; NIF improved to -40, FVC 0.7.  Goal extubation today 3/18 PAD protocol   Clinical suspicion for acute PE low in this setting >> d/c'd CT-PA that was ordered on presentation.  Follow CXR Continue home albuterol prn  CARDIOVASCULAR A:  H/O HTN - on no home medications  P:  Telemetry Monitoring   RENAL A:   No acute problems  Hypokalemia  P:   Trend renal function panel  Electrolyte replacement as needed   GASTROINTESTINAL A:   Dysphagia  H/O GERD  P:   PPI Swallow Eval after extubation  NG/OG tube for medications  TF while intubated  HEMATOLOGIC A:   Anemia  P:  Trend CBC  Keep Hemoglobin greater 7  INFECTIOUS A:   Recent UTI P:   Trend WBC and Fever curve   ENDOCRINE A:   Hyperglycemia    P:  SSI  Q4 CBG  NEUROLOGIC A:   Myasthenia Gravis exacerbation  P:   Neurology following  IVIg day #4 Continue home mestinon   RASS goal: 0   FAMILY  - Updates: by RB 3/16 and 3/17  - Inter-disciplinary family meet or Palliative Care meeting due by:  3/22  Independent CC time 35 minutes   Baltazar Apo, MD, PhD 05/19/2015, 9:18 AM [facility] [phone] or if no answer [phone]

## 2015-05-19 NOTE — Progress Notes (Signed)
Rt note: Pt not alert enough at this time for NIF/ VC.

## 2015-05-19 NOTE — Progress Notes (Signed)
FPTS Social Note  Patient was extubated then emergently re-intubated within an hour for respiratory insufficiency. Family was present at the time and aware of this morning's events.   Appreciate the wonderful care CCM and Neurology are providing. Will continue to follow along until Family Medicine can resume care.   [place] provided cell phone number [phone] for any big updates.   Rogue Bussing, MD 05/19/2015, 8:34 AM PGY-1, [facility] pager [phone]

## 2015-05-19 NOTE — Progress Notes (Signed)
nif 40, vc .7L best of three, good patient effort. RT will continue to monitor.

## 2015-05-19 NOTE — Progress Notes (Signed)
abg results called to Dr. Lamonte Sakai, no new orders received at this time.

## 2015-05-19 NOTE — Progress Notes (Addendum)
PCCM interval Note  Patient extubated this morning and fairly immediately developed inspiratory stridor. Associated with some anxiety. Hopefully we will be able to resolve this with relaxation techniques, corticosteroids, cool mist. She is at some risk for reintubation  Baltazar Apo, MD, PhD 05/19/2015, 10:02 AM [facility] [phone] or if no answer [phone]

## 2015-05-19 NOTE — Procedures (Signed)
Extubation Procedure Note  Patient Details:   Name: Frances Medina DOB: 10-02-1960 MRN: [id number]   Airway Documentation:  Airway 7.5 mm (Active)  Secured at (cm) 21 cm 05/19/2015  8:34 AM  Measured From Lips 05/19/2015  8:34 AM  Secured Location Right 05/19/2015  8:34 AM  Secured By [employer] 05/19/2015  8:34 AM  Tube Holder Repositioned Yes 05/19/2015  8:34 AM  Cuff Pressure (cm H2O) 24 cm H2O 05/18/2015 11:31 AM  Site Condition Dry 05/19/2015  8:34 AM   Pt extubated to 4lpm [name], no distress noted at this time, incentive spirometer done x10 breaths at 250-500cc's followed by productive cough.  Evaluation  O2 sats: stable throughout Complications: No apparent complications Patient did tolerate procedure well. Bilateral Breath Sounds: Clear, Diminished Suctioning: Oral, Airway Yes  Mylinda Brook Wyatt Haste 05/19/2015, 9:42 AM

## 2015-05-20 DIAGNOSIS — N390 Urinary tract infection, site not specified: Secondary | ICD-10-CM

## 2015-05-20 LAB — GLUCOSE, CAPILLARY
Glucose-Capillary: 176 mg/dL — ABNORMAL HIGH (ref 65–99)
Glucose-Capillary: 168 mg/dL — ABNORMAL HIGH (ref 65–99)
Glucose-Capillary: 167 mg/dL — ABNORMAL HIGH (ref 65–99)
Glucose-Capillary: 174 mg/dL — ABNORMAL HIGH (ref 65–99)
Glucose-Capillary: 162 mg/dL — ABNORMAL HIGH (ref 65–99)

## 2015-05-20 LAB — CBC
HCT: 30.3 % — ABNORMAL LOW (ref 36.0–46.0)
Hemoglobin: 9 g/dL — ABNORMAL LOW (ref 12.0–15.0)
MCH: 24.9 pg — ABNORMAL LOW (ref 26.0–34.0)
MCHC: 29.7 g/dL — ABNORMAL LOW (ref 30.0–36.0)
MCV: 83.7 fL (ref 78.0–100.0)
Platelets: 317 10*3/uL (ref 150–400)
RBC: 3.62 MIL/uL — ABNORMAL LOW (ref 3.87–5.11)
RDW: 15.9 % — ABNORMAL HIGH (ref 11.5–15.5)
WBC: 12.9 10*3/uL — ABNORMAL HIGH (ref 4.0–10.5)

## 2015-05-20 LAB — BASIC METABOLIC PANEL
Anion gap: 5 (ref 5–15)
BUN: 19 mg/dL (ref 6–20)
CO2: 28 mmol/L (ref 22–32)
Calcium: 9 mg/dL (ref 8.9–10.3)
Chloride: 104 mmol/L (ref 101–111)
Creatinine, Ser: 0.61 mg/dL (ref 0.44–1.00)
GFR calc Af Amer: >60 mL/min (ref >60)
GFR calc non Af Amer: >60 mL/min (ref >60)
Glucose, Bld: 198 mg/dL — ABNORMAL HIGH (ref 65–99)
Potassium: 4.1 mmol/L (ref 3.5–5.1)
Sodium: 137 mmol/L (ref 135–145)

## 2015-05-20 NOTE — Progress Notes (Signed)
Interval History:                                                                                                                      Frances Medina is an 55 y.o. female patient with  myasthenic crisis, intubated. on Solu-Medrol to help with pharyngeal edema causing the stridor. she feels that she is getting stronger day by day, her respiratory function is improving, no motor weakness in her limbs..   Past Medical History: Past Medical History  Diagnosis Date  . GERD (gastroesophageal reflux disease)   . 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 Knee injury   . Depression   . Seasonal allergies     takes Zytrec  . Fibroid uterus     size of a dime  . Umbilical hernia     watching , no plans for surgery at present  . H/O hiatal hernia   . Hypertension     "went away when I stopped smoking"  . Anxiety   . Migraine     "maybe couple times/month" (03/20/2015)  . Arthritis     "knees" (03/20/2015)  . Osteoarthritis of right knee 08/30/2013  . Osteoarthritis of left knee 12/02/2013  . Myasthenia gravis ([place]) 2017  . E. coli UTI 04/07/2015    Past Surgical History  Procedure Laterality Date  . Cesarean section  1987; 1989  . Dilation and curettage of uterus    . Tubal ligation  1989  . Vaginal hysterectomy  1990's?    "apparently took out one of my ovaries at the time too cause one's missing"  . Partial knee arthroplasty Right 08/30/2013    Procedure: RIGHT UNICOMPARTMENTAL KNEE;  Surgeon: Johnny Bridge, MD;  Location: [place];  Service: Orthopedics;  Laterality: Right;  . Colonoscopy with propofol N/A 09/19/2013    Procedure: COLONOSCOPY WITH PROPOFOL;  Surgeon: Ladene Artist, MD;  Location: [facility];  Service: Endoscopy;  Laterality: N/A;  . Partial knee arthroplasty Left 12/02/2013    Procedure: LEFT KNEE UNI ARTHROPLASTY;  Surgeon: Johnny Bridge, MD;  Location: [place];  Service: Orthopedics;  Laterality: Left;  . Esophagogastroduodenoscopy (egd) with propofol N/A  03/21/2015    Procedure: ESOPHAGOGASTRODUODENOSCOPY (EGD) WITH PROPOFOL;  Surgeon: Gatha Mayer, MD;  Location: [place];  Service: Endoscopy;  Laterality: N/A;    Family History: Family History  Problem Relation Age of Onset  . Heart disease Mother 66  . Hypertension Mother   . Stroke Father   . Hypertension Father   . Colon cancer Neg Hx   . Other Sister     Esophageal strictures  . Anemia Sister     Social History:   reports that she quit smoking about 3 years ago. Her smoking use included Cigarettes. She has a 20 pack-year smoking history. She has never used smokeless tobacco. She reports that she does not drink alcohol or use illicit drugs.  Allergies:  Allergies  Allergen Reactions  .  Dicyclomine Hives  . Methocarbamol Hives  . Penicillins Hives    Has patient had a PCN reaction causing immediate rash, facial/tongue/throat swelling, SOB or lightheadedness with hypotension: Yes Has patient had a PCN reaction causing severe rash involving mucus membranes or skin necrosis: No Has patient had a PCN reaction that required hospitalization No Has patient had a PCN reaction occurring within the last 10 years: No If all of the above answers are "NO", then may proceed with Cephalosporin use.   . Ciprofloxacin     Rash, shortness of breath  . Shrimp [Shellfish Allergy]      Medications:                                                                                                                         Current facility-administered medications:  .  albuterol (PROVENTIL HFA;VENTOLIN HFA) 108 (90 Base) MCG/ACT inhaler 2 puff, 2 puff, Inhalation, Q4H PRN, Rogue Bussing, MD .  antiseptic oral rinse solution (CORINZ), 7 mL, Mouth Rinse, QID, Rush Farmer, MD, 7 mL at 05/20/15 1607 .  chlorhexidine gluconate (PERIDEX) 0.12 % solution 15 mL, 15 mL, Mouth Rinse, BID, Rush Farmer, MD, 15 mL at 05/20/15 2020 .  dexmedetomidine (PRECEDEX) 200 MCG/50ML (4 mcg/mL)  infusion, 0.4-1.2 mcg/kg/hr, Intravenous, Continuous, Erick Colace, NP, Stopped at 05/17/15 0300 .  dextrose 5 %-0.45 % sodium chloride infusion, , Intravenous, Continuous, Collene Gobble, MD, Last Rate: 50 mL/hr at 05/20/15 0153 .  docusate (COLACE) 50 MG/5ML liquid 100 mg, 100 mg, Per Tube, BID PRN, Erick Colace, NP .  enoxaparin (LOVENOX) injection 60 mg, 60 mg, Subcutaneous, Q24H, Rogue Bussing, MD, 60 mg at 05/20/15 1207 .  famotidine (PEPCID) 40 MG/5ML suspension 20 mg, 20 mg, Oral, BID, Grettell Ransdell Fuller Mandril, MD, 20 mg at 05/20/15 2043 .  feeding supplement (PRO-STAT SUGAR FREE 64) liquid 30 mL, 30 mL, Per Tube, TID, Asencion Islam, RD, 30 mL at 05/20/15 2043 .  feeding supplement (VITAL HIGH PROTEIN) liquid 1,000 mL, 1,000 mL, Per Tube, Q24H, Heather C Pitts, RD, 1,000 mL at 05/20/15 1407 .  fentaNYL (SUBLIMAZE) injection 100 mcg, 100 mcg, Intravenous, Q15 min PRN, Erick Colace, NP, 100 mcg at 05/19/15 1028 .  fentaNYL (SUBLIMAZE) injection 100 mcg, 100 mcg, Intravenous, Q2H PRN, Erick Colace, NP, 100 mcg at 05/20/15 2020 .  insulin aspart (novoLOG) injection 0-9 Units, 0-9 Units, Subcutaneous, 6 times per day, Rogue Bussing, MD, 2 Units at 05/20/15 2020 .  iohexol (OMNIPAQUE) 350 MG/ML injection 100 mL, 100 mL, Intravenous, Once PRN, Marliss Coots, PA-C .  midazolam (VERSED) injection 2 mg, 2 mg, Intravenous, Q15 min PRN, Erick Colace, NP, 2 mg at 05/19/15 1028 .  midazolam (VERSED) injection 2 mg, 2 mg, Intravenous, Q2H PRN, Erick Colace, NP, 2 mg at 05/20/15 2020 .  multivitamin with minerals tablet 1 tablet, 1 tablet, Oral, Daily, Asencion Islam, RD, 1 tablet at  05/20/15 1007 .  pantoprazole (PROTONIX) injection 40 mg, 40 mg, Intravenous, Q24H, Rogue Bussing, MD, 40 mg at 05/20/15 2043 .  pyridostigmine (MESTINON) tablet 60 mg, 60 mg, Per Tube, QID, Celia Gibbons Fuller Mandril, MD, 60 mg at 05/20/15 1707 .  sodium chloride flush (NS)  0.9 % injection 3 mL, 3 mL, Intravenous, Q12H, Rogue Bussing, MD, 3 mL at 05/20/15 2044 .  sulfamethoxazole-trimethoprim (BACTRIM,SEPTRA) 200-40 MG/5ML suspension 20 mL, 20 mL, Per Tube, Q12H, Collene Gobble, MD, 20 mL at 05/20/15 2043   Neurologic Examination:                                                                                                     Today's Vitals   05/20/15 1900 05/20/15 1950 05/20/15 2000 05/20/15 2100  BP: 130/80  126/72 126/88  Pulse: 76  89 71  Temp:      TempSrc:      Resp: 16  18 15   Height:      Weight:      SpO2: 99% 99% 100% 100%  PainSc:        Patient intubated, without sedation she is alert and follows commands appropriately able to demonstrate good strength in all 4 extremities without a drift.   Lab Results: Basic Metabolic Panel:  Recent Labs Lab 05/15/15 2037 05/18/15 0530 05/19/15 0553 05/20/15 0320  NA 142 140 139 137  K 3.6 3.4* 3.3* 4.1  CL 104 105 101 104  CO2 27 27 30 28   GLUCOSE 107* 112* 116* 198*  BUN 7 10 14 19   CREATININE 0.69 0.50 0.60 0.61  CALCIUM 9.4 8.8* 8.9 9.0    Liver Function Tests:  Recent Labs Lab 05/15/15 2037  AST 29  ALT 20  ALKPHOS 81  BILITOT 0.5  PROT 7.5  ALBUMIN 3.6    Recent Labs Lab 05/15/15 2037  LIPASE 36   No results for input(s): AMMONIA in the last 168 hours.  CBC:  Recent Labs Lab 05/15/15 2037 05/18/15 0530 05/20/15 0320  WBC 10.0 8.9 12.9*  NEUTROABS 8.1*  --   --   HGB 11.7* 9.3* 9.0*  HCT 38.5 31.8* 30.3*  MCV 84.2 84.4 83.7  PLT 382 297 317    Cardiac Enzymes: No results for input(s): CKTOTAL, CKMB, CKMBINDEX, TROPONINI in the last 168 hours.  Lipid Panel: No results for input(s): CHOL, TRIG, HDL, CHOLHDL, VLDL, LDLCALC in the last 168 hours.  CBG:  Recent Labs Lab 05/20/15 0353 05/20/15 0743 05/20/15 1128 05/20/15 1529 05/20/15 2013  GLUCAP 176* 168* 167* 174* 162*    Microbiology: Results for orders placed or performed during  the hospital encounter of 05/15/15  Culture, Urine     Status: None   Collection Time: 05/15/15 10:30 PM  Result Value Ref Range Status   Specimen Description URINE, RANDOM  Final   Special Requests NONE  Final   Culture >=100,000 COLONIES/mL ESCHERICHIA COLI  Final   Report Status 05/18/2015 FINAL  Final   Organism ID, Bacteria ESCHERICHIA COLI  Final      Susceptibility  Escherichia coli - MIC*    AMPICILLIN 4 SENSITIVE Sensitive     CEFAZOLIN <=4 SENSITIVE Sensitive     CEFTRIAXONE <=1 SENSITIVE Sensitive     CIPROFLOXACIN >=4 RESISTANT Resistant     GENTAMICIN <=1 SENSITIVE Sensitive     IMIPENEM <=0.25 SENSITIVE Sensitive     NITROFURANTOIN <=16 SENSITIVE Sensitive     TRIMETH/SULFA <=20 SENSITIVE Sensitive     AMPICILLIN/SULBACTAM 4 SENSITIVE Sensitive     PIP/TAZO <=4 SENSITIVE Sensitive     * >=100,000 COLONIES/mL ESCHERICHIA COLI  MRSA culture     Status: None   Collection Time: 05/16/15  1:45 PM  Result Value Ref Range Status   Specimen Description NASOPHARYNGEAL  Final   Special Requests NONE  Final   Culture NOMRSA Performed at [facility]   Final   Report Status 05/18/2015 FINAL  Final    Imaging: Dg Chest Port 1 View  05/19/2015  CLINICAL DATA:  Hypoxia EXAM: PORTABLE CHEST 1 VIEW COMPARISON:  May 19, 2015 study obtained earlier in the day FINDINGS: Endotracheal tube tip is 9 mm above the carina. Central catheter tip is at the cavoatrial junction. Nasogastric tube tip and side port are below the diaphragm in the stomach. No pneumothorax. There is no edema or consolidation. Heart size and pulmonary vascularity are within normal limits. No adenopathy. No bone lesions apparent. IMPRESSION: Tube and catheter positions as described without pneumothorax. Note that the endotracheal tube tip is close to the carina. It may be prudent to withdrawal endotracheal tube approximately 3 cm. No edema or consolidation. These results will be called to the ordering clinician  or representative by the Radiologist Assistant, and communication documented in the PACS or zVision Dashboard. Electronically Signed   By: Lowella Grip III M.D.   On: 05/19/2015 11:16   Dg Chest Port 1 View  05/19/2015  CLINICAL DATA:  Acute respiratory failure EXAM: PORTABLE CHEST 1 VIEW COMPARISON:  May 18, 2015 FINDINGS: The ETT is in good position. No other change in support apparatus. No pneumothorax. Bibasilar atelectasis, right greater than left. No other interval changes. IMPRESSION: The ETT is in good position.  No other change. Electronically Signed   By: Dorise Bullion III M.D   On: 05/19/2015 08:11   Dg Chest Port 1 View  05/18/2015  CLINICAL DATA:  Acute respiratory failure.  Endotracheal tube. EXAM: PORTABLE CHEST 1 VIEW COMPARISON:  05/16/2015 FINDINGS: Endotracheal tube terminates approximately 2.5 cm above the carina. Right jugular central venous catheter terminates over the cavoatrial junction. Enteric tube courses into the left upper abdomen with tip not imaged. Lung volumes remain diminished with persistent mild patchy opacities in both lung bases. Trace bilateral pleural effusions are questioned. No pneumothorax. IMPRESSION: 1. Support devices as above. 2. Low lung volumes with persistent mild bibasilar opacities which may reflect atelectasis. Electronically Signed   By: Logan Bores M.D.   On: 05/18/2015 08:21   Dg Abd Portable 1v  05/19/2015  CLINICAL DATA:  Evaluate OG tube. EXAM: PORTABLE ABDOMEN - 1 VIEW COMPARISON:  May 16, 2015 FINDINGS: The OG tube terminates in the distal stomach. No other acute abnormalities identified. IMPRESSION: Appropriate placement of OG tube. Electronically Signed   By: Dorise Bullion III M.D   On: 05/19/2015 11:17    Assessment and plan:   ZARRIYAH DING is an 55 y.o. female patient admitted with myasthenic crisis, on IVIG. on IV Solu-Medrol 250 [place] grams every 6 hours,  Mestinon 60 mg 4 times a  day.  Overall doing well from  myasthenia standpoint. Continue the current treatment plan, no change from neurology.  We'll follow-up

## 2015-05-20 NOTE — Progress Notes (Addendum)
OT Cancellation Note  Patient Details Name: Frances Medina MRN: [id number] DOB: Apr 16, 1960   Cancelled Treatment:    Reason Eval/Treat Not Completed: Patient not medically ready.Noted PT note from earlier this AM--"Pt intubated (failed extubation yesterday) and RN requests to hold PT for now. PT will continue to follow acutely." Will also hold OT for now and check back at later date for appropriateness of eval.  Almon Register [id number] 05/20/2015, 10:40 AM

## 2015-05-20 NOTE — Progress Notes (Signed)
PT Cancellation Note  Patient Details Name: Frances Medina MRN: [id number] DOB: 09/03/1960   Cancelled Treatment:    Reason Eval/Treat Not Completed: Patient not medically ready.  Pt intubated and RN requests to hold PT for now.  PT will continue to follow acutely.   Collie Siad PT, DPT  Pager: [phone] Phone: [phone] 05/20/2015, 9:05 AM

## 2015-05-20 NOTE — Progress Notes (Signed)
NIF -20, and of a VC of .65L best of three. Pt gave good effort. RT will continue to monitor.

## 2015-05-20 NOTE — Progress Notes (Addendum)
PULMONARY / CRITICAL CARE MEDICINE   Name: Frances Medina MRN: [id number] DOB: [birth year]    ADMISSION DATE:  05/15/2015 CONSULTATION DATE: 05/16/2015  REFERRING MD:   CHIEF COMPLAINT: Weakness, dysphagia, SOB in setting of known MG   HISTORY OF PRESENT ILLNESS:  55 y.o. female presenting to ED on 3/14 with increased weakness, SOB, and dysphagia over the last 7 d PTA.. Of note, she was recently diagnosed with myasthenia gravis during hospitalization in January 2017, during which time she required treatment with IVIG and was started on pyridostigmine. She had another hospitalization for MG flare in February, also requiring treatment with IVIG and steroid taper, thought to have been triggered by a UTI. She was supposed to have continued prednisone upon last hospital discharge 04/13/15, but patient was not aware that she had a prescription. She called her neurologist Dr. Audelia Acton last Friday about her dysphagia, and he recommended increasing frequency of pyridostigmine 60 mg to q6h from q8h. He also noted that patient would need to start steroids or be seen in the ED. Patient had an appointment to see him 05/16/15 with plans to start prednisone, but with increasing fatigue, she decided to be evaluated in the ED. Swallowing has become more difficult since last Friday, 05/11/15. She was only able to eat "a few nuggets" today. She was, however, able to take all but her evening dose of pyridostigmine. She has been able to drink fluids but not as much as usual. She and her husband report that she has trouble with her speech if she talks too much. She also complains of chest pain under her breast that feels like "it's swollen" and cuts off her breath since yesterday. She has had rhinorrhea with post-nasal drip and cough in the middle of the night since last weak but has not taken any new OTC medications. No sick contacts.In the ED on 3/14, patient's vital signs were stable, and SpO2 was 99% on room air. She was to be  admitted to the ICU. Since presentation her swallowing fxn declined further w/ difficulty swallowing oral secretions. A NIF was recorded at -25, and her VC was 552ml. PCCM was consulted given concern for impending respiratory failure.   SUBJECTIVE:  Unsuccessful extubation 3/18, reintubated.  Resting comfortably right now.   VITAL SIGNS: BP 128/69 mmHg  Pulse 77  Temp(Src) 98.6 F (37 C) (Axillary)  Resp 20  Ht 5' (1.524 m)  Wt 120.7 kg (266 lb 1.5 oz)  BMI 51.97 kg/m2  SpO2 100%  HEMODYNAMICS:    VENTILATOR SETTINGS: Vent Mode:  [-] PSV;CPAP FiO2 (%):  [40 %-50 %] 40 % Set Rate:  [14 bmp] 14 bmp Vt Set:  [370 mL] 370 mL PEEP:  [5 cmH20] 5 cmH20 Pressure Support:  [8 cmH20] 8 cmH20 Plateau Pressure:  [13 cmH20-17 cmH20] 17 cmH20  INTAKE / OUTPUT: I/O last 3 completed shifts: In: 3173.7 [I.V.:1860; NG/GT:1209.7; IV Piggyback:104] Out: 245 [Urine:245]  PHYSICAL EXAMINATION: General:  55 year old aaf, awake, ventilated Neuro:  Awake, eyes open without ptosis, interacts, nods to questions, moves all extremities HEENT:  ETT in place, OP clear,  Cardiovascular:  rrr w/out MRG Lungs:  Clear w/out accessory muscle use.  Abdomen:  Soft, no OM, + bowel sounds Musculoskeletal:  Equal st and bulk Skin:  Warm and dry   LABS:  BMET  Recent Labs Lab 05/18/15 0530 05/19/15 0553 05/20/15 0320  NA 140 139 137  K 3.4* 3.3* 4.1  CL 105 101 104  CO2 27 30  28  BUN 10 14 19   CREATININE 0.50 0.60 0.61  GLUCOSE 112* 116* 198*    Electrolytes  Recent Labs Lab 05/18/15 0530 05/19/15 0553 05/20/15 0320  CALCIUM 8.8* 8.9 9.0    CBC  Recent Labs Lab 05/15/15 2037 05/18/15 0530 05/20/15 0320  WBC 10.0 8.9 12.9*  HGB 11.7* 9.3* 9.0*  HCT 38.5 31.8* 30.3*  PLT 382 297 317    Coag's No results for input(s): APTT, INR in the last 168 hours.  Sepsis Markers  Recent Labs Lab 05/16/15 1255 05/17/15 0500 05/18/15 0530  PROCALCITON <0.10 <0.10 <0.10     ABG  Recent Labs Lab 05/16/15 1249 05/16/15 1836 05/19/15 1249  PHART 7.298* 7.393 7.350  PCO2ART 59.4* 44.2 57.0*  PO2ART 115.0* 124.0* 96.0    Liver Enzymes  Recent Labs Lab 05/15/15 2037  AST 29  ALT 20  ALKPHOS 81  BILITOT 0.5  ALBUMIN 3.6    Cardiac Enzymes No results for input(s): TROPONINI, PROBNP in the last 168 hours.  Glucose  Recent Labs Lab 05/19/15 1142 05/19/15 1548 05/19/15 1922 05/19/15 2320 05/20/15 0353 05/20/15 0743  GLUCAP 191* 157* 175* 142* 176* 168*    Imaging Dg Chest Port 1 View  05/19/2015  CLINICAL DATA:  Hypoxia EXAM: PORTABLE CHEST 1 VIEW COMPARISON:  May 19, 2015 study obtained earlier in the day FINDINGS: Endotracheal tube tip is 9 mm above the carina. Central catheter tip is at the cavoatrial junction. Nasogastric tube tip and side port are below the diaphragm in the stomach. No pneumothorax. There is no edema or consolidation. Heart size and pulmonary vascularity are within normal limits. No adenopathy. No bone lesions apparent. IMPRESSION: Tube and catheter positions as described without pneumothorax. Note that the endotracheal tube tip is close to the carina. It may be prudent to withdrawal endotracheal tube approximately 3 cm. No edema or consolidation. These results will be called to the ordering clinician or representative by the Radiologist Assistant, and communication documented in the PACS or zVision Dashboard. Electronically Signed   By: Lowella Grip III M.D.   On: 05/19/2015 11:16   Dg Abd Portable 1v  05/19/2015  CLINICAL DATA:  Evaluate OG tube. EXAM: PORTABLE ABDOMEN - 1 VIEW COMPARISON:  May 16, 2015 FINDINGS: The OG tube terminates in the distal stomach. No other acute abnormalities identified. IMPRESSION: Appropriate placement of OG tube. Electronically Signed   By: Dorise Bullion III M.D   On: 05/19/2015 11:17     STUDIES:  CXR 3/14 >> Hypoinflation of the lungs with mild bibasilar subsegmental  atelectasis   CULTURES: Urinalysis 3/14 >> Positive nitrites with trace leukocytes  Urine 3/14 >> E coli (R to cipro)  ANTIBIOTICS: Bactrim 3/18 >>   SIGNIFICANT EVENTS: 2/10 >> D/C from hospital  3/13 >> Onset of severe SOB and weakness  3/14 >> Present to ED  3/15 >> IVIG and ETT   LINES/TUBES: ETT 3/15 >> 3/18; 3/18 >>  CVC 3/15 >>   DISCUSSION: 55 year old female presenting to the ED on 3/14 with increased SOB and weakness. Was diagnosed with MG in January 2017. Discharged on 2/10 with prednisone, patient states that she was unaware of the prescription and had not been taking. Neurology and PCCM consulted. PCCM to admit. Due to patient severe dysphagia, decreased NIF and VC and inability to protect airway patient will be intubated, after which IVIG will be started and home mestinon will be restarted via NG/OG.   ASSESSMENT / PLAN:  PULMONARY A: Acute  respiratory failure due to flare of MG Reintubation 3/18 due to UA and VC edema P:   Strength improved, reintubation primarily for cord edema / stridor.  Solumedrol x 1 more dose. Assess for extubation if good cuff leak on 3/20 PAD protocol  Follow CXR Continue home albuterol prn  CARDIOVASCULAR A:  H/O HTN - on no home medications  P:  Telemetry Monitoring   RENAL A:   No acute problems  Hypokalemia  P:   Trend renal function panel  Electrolyte replacement as needed   GASTROINTESTINAL A:   Dysphagia  H/O GERD  P:   PPI Swallow Eval after extubation  NG/OG tube for medications  TF while intubated  HEMATOLOGIC A:   Anemia  P:  Trend CBC  Keep Hemoglobin greater 7  INFECTIOUS A:   E coli UTI P:   Bactrim started 3/18, plan 3 days Avoid foley   ENDOCRINE A:   Hyperglycemia    P:   SSI  Q4 CBG  NEUROLOGIC A:   Myasthenia Gravis exacerbation  P:   Neurology following  IVIg day #5 Continue home mestinon   RASS goal: 0 to -1   FAMILY  - Updates: by RB 3/16 and 3/17  -  Inter-disciplinary family meet or Palliative Care meeting due by:  3/22  Independent CC time 66 minutes  Baltazar Apo, MD, PhD 05/20/2015, 9:26 AM  Pulmonary and Critical Care [phone] or if no answer [phone]

## 2015-05-20 NOTE — Progress Notes (Signed)
NIF -20. VC .78L.

## 2015-05-21 ENCOUNTER — Inpatient Hospital Stay (HOSPITAL_COMMUNITY): Payer: BC Managed Care – PPO

## 2015-05-21 LAB — GLUCOSE, CAPILLARY
Glucose-Capillary: 110 mg/dL — ABNORMAL HIGH (ref 65–99)
Glucose-Capillary: 131 mg/dL — ABNORMAL HIGH (ref 65–99)
Glucose-Capillary: 153 mg/dL — ABNORMAL HIGH (ref 65–99)
Glucose-Capillary: 155 mg/dL — ABNORMAL HIGH (ref 65–99)
Glucose-Capillary: 158 mg/dL — ABNORMAL HIGH (ref 65–99)
Glucose-Capillary: 171 mg/dL — ABNORMAL HIGH (ref 65–99)

## 2015-05-21 MED ORDER — KETOROLAC TROMETHAMINE 15 MG/ML IJ SOLN
7.5000 mg | Freq: Once | INTRAMUSCULAR | Status: AC
  Administered 2015-05-22: 7.5 mg via INTRAVENOUS
  Filled 2015-05-21: qty 1

## 2015-05-21 NOTE — Progress Notes (Signed)
eLink Physician-Brief Progress Note Patient Name: Frances Medina DOB: 1960/05/02 MRN: [id number]   Date of Service  05/21/2015  HPI/Events of Note  Nurse reports patient is signifying pain in her head despite receiving 2 doses of fentanyl 100 g IV. No NG tube in place. Patient currently intubated and unable to provide further information. Per nursing report patient remains nonfocal and neuro exam is unchanged. Renal function normal.  eICU Interventions  Toradol 7.5 mg IV 1     Intervention Category Intermediate Interventions: Pain - evaluation and management  Tera Partridge 05/21/2015, 11:52 PM

## 2015-05-21 NOTE — Evaluation (Signed)
Physical Therapy Evaluation Patient Details Name: Frances Medina MRN: [id number] DOB: [birth year] Today's Date: 05/21/2015   History of Present Illness  pt presents with c/o difficulty swallowing for several days and SOB.  pt found to be in Myasthenia Gravis Flare up with recent diagnosis of Myasthenia Gravis in January and a flare up in February.  pt intubated on 3/15, extubated 3/18 with re-intubation on 3/18 and on vent for Eval.  pt with hx of HTN, Depression, Anxiety, and Migraines.  pt currently undergoing work-up for Splenomegaly with Lymphadenopathy.    Clinical Impression  Pt very motivated for OOB and is moving very well with only MinA, with 2nd person present to A with line/vent management.  Feel as pt progresses medically that she will progress to the point to be able to D/C to home with HHPT f/u.  Will continue to follow.      Follow Up Recommendations Home health PT;Supervision - Intermittent    Equipment Recommendations  None recommended by PT    Recommendations for Other Services       Precautions / Restrictions Precautions Precautions: Fall Precaution Comments: Vent Restrictions Weight Bearing Restrictions: No      Mobility  Bed Mobility Overal bed mobility: Needs Assistance Bed Mobility: Supine to Sit     Supine to sit: Min assist;+2 for safety/equipment;HOB elevated     General bed mobility comments: pt moving well and only needs A for trunk and scooting closer to EOB.  2 person A for management of lines.    Transfers Overall transfer level: Needs assistance Equipment used: 1 person hand held assist Transfers: Sit to/from [facility] Sit to Stand: Min assist;+2 safety/equipment Stand pivot transfers: Min assist;+2 safety/equipment       General transfer comment: 2nd person present for A with line management,  pt only required MinA for balance with pivot to recliner.    Ambulation/Gait                Stairs             Wheelchair Mobility    Modified Rankin (Stroke Patients Only)       Balance Overall balance assessment: Needs assistance Sitting-balance support: Single extremity supported;Feet supported Sitting balance-Leahy Scale: Fair     Standing balance support: Bilateral upper extremity supported;During functional activity Standing balance-Leahy Scale: Fair                               Pertinent Vitals/Pain Pain Assessment: No/denies pain    Home Living Family/patient expects to be discharged to:: Private residence Living Arrangements: Spouse/significant other Available Help at Discharge: Family;Available PRN/intermittently Type of Home: House Home Access: Stairs to enter Entrance Stairs-Rails: Left Entrance Stairs-Number of Steps: 3 Home Layout: Two level;Able to live on main level with bedroom/bathroom Home Equipment: Gilford Rile - 2 wheels;Cane - single point;Bedside commode;Shower seat (pt indicates she also has a bed pan.)      Prior Function Level of Independence: Independent with assistive device(s) (Occasionally indicates AD use.)               Hand Dominance        Extremity/Trunk Assessment   Upper Extremity Assessment: Defer to OT evaluation           Lower Extremity Assessment: Generalized weakness         Communication   Communication: Other (comment) (Orally Intubated)  Cognition Arousal/Alertness: Awake/alert Behavior During Therapy: [facility]  for tasks assessed/performed Overall Cognitive Status: Difficult to assess                      General Comments      Exercises        Assessment/Plan    PT Assessment Patient needs continued PT services  PT Diagnosis Difficulty walking;Generalized weakness   PT Problem List Decreased strength;Decreased activity tolerance;Decreased balance;Decreased mobility;Decreased coordination;Decreased knowledge of use of DME;Cardiopulmonary status limiting activity;Obesity  PT Treatment  Interventions DME instruction;Gait training;Stair training;Functional mobility training;Therapeutic activities;Therapeutic exercise;Balance training;Neuromuscular re-education;Patient/family education   PT Goals (Current goals can be found in the Care Plan section) Acute Rehab PT Goals Patient Stated Goal: Off the vent! PT Goal Formulation: With patient Time For Goal Achievement: 06/04/15 Potential to Achieve Goals: Good    Frequency Min 3X/week   Barriers to discharge        Co-evaluation PT/OT/SLP Co-Evaluation/Treatment: Yes Reason for Co-Treatment: Complexity of the patient's impairments (multi-system involvement) PT goals addressed during session: Mobility/safety with mobility;Balance         End of Session Equipment Utilized During Treatment:  (Vent) Activity Tolerance: Patient tolerated treatment well Patient left: in chair;with call bell/phone within reach;with chair alarm set Nurse Communication: Mobility status         Time: [id number] PT Time Calculation (min) (ACUTE ONLY): 26 min   Charges:   PT Evaluation $PT Eval Moderate Complexity: 1 Procedure     PT G CodesCatarina Hartshorn, [place] 05/21/2015, 12:44 PM

## 2015-05-21 NOTE — Progress Notes (Signed)
PULMONARY / CRITICAL CARE MEDICINE   Name: Frances Medina MRN: [id number] DOB: 1960-10-21    ADMISSION DATE:  05/15/2015 CONSULTATION DATE: 05/16/2015  REFERRING MD:   CHIEF COMPLAINT: Weakness, dysphagia, SOB in setting of known MG   HISTORY OF PRESENT ILLNESS:  55 y.o. female presenting to ED on 3/14 with increased weakness, SOB, and dysphagia over the last 7 d PTA.. Of note, she was recently diagnosed with myasthenia gravis during hospitalization in January 2017, during which time she required treatment with IVIG and was started on pyridostigmine. She had another hospitalization for MG flare in February, also requiring treatment with IVIG and steroid taper, thought to have been triggered by a UTI. She was supposed to have continued prednisone upon last hospital discharge 04/13/15, but patient was not aware that she had a prescription. She called her neurologist Dr. Audelia Acton last Friday about her dysphagia, and he recommended increasing frequency of pyridostigmine 60 mg to q6h from q8h. He also noted that patient would need to start steroids or be seen in the ED. Patient had an appointment to see him 05/16/15 with plans to start prednisone, but with increasing fatigue, she decided to be evaluated in the ED. Swallowing has become more difficult since last Friday, 05/11/15. She was only able to eat "a few nuggets" today. She was, however, able to take all but her evening dose of pyridostigmine. She has been able to drink fluids but not as much as usual. She and her husband report that she has trouble with her speech if she talks too much. She also complains of chest pain under her breast that feels like "it's swollen" and cuts off her breath since yesterday. She has had rhinorrhea with post-nasal drip and cough in the middle of the night since last weak but has not taken any new OTC medications. No sick contacts.In the ED on 3/14, patient's vital signs were stable, and SpO2 was 99% on room air. She was to be  admitted to the ICU. Since presentation her swallowing fxn declined further w/ difficulty swallowing oral secretions. A NIF was recorded at -25, and her VC was 533ml. PCCM was consulted given concern for impending respiratory failure.   SUBJECTIVE:  Afebrile Denies pain Resting comfortably   VITAL SIGNS: BP 159/88 mmHg  Pulse 80  Temp(Src) 98.4 F (36.9 C) (Axillary)  Resp 14  Ht 5' (1.524 m)  Wt 268 lb 1.3 oz (121.6 kg)  BMI 52.36 kg/m2  SpO2 100%  HEMODYNAMICS:    VENTILATOR SETTINGS: Vent Mode:  [-] PRVC FiO2 (%):  [40 %] 40 % Set Rate:  [14 bmp] 14 bmp Vt Set:  [370 mL] 370 mL PEEP:  [5 cmH20] 5 cmH20 Pressure Support:  [5 cmH20] 5 cmH20 Plateau Pressure:  [16 cmH20] 16 cmH20  INTAKE / OUTPUT: I/O last 3 completed shifts: In: [id number] [I.V.:1800; NG/GT:1440; IV Piggyback:156] Out: -   PHYSICAL EXAMINATION: General:  55 year old aaf, awake, ventilated Neuro:  Awake, eyes open without ptosis, interacts, nods to questions, moves all extremities HEENT:  ETT in place, OP clear,  Cardiovascular:  rrr w/out MRG Lungs:  Clear w/out accessory muscle use.  Abdomen:  Soft, no OM, + bowel sounds Musculoskeletal:  Equal st and bulk Skin:  Warm and dry   LABS:  BMET  Recent Labs Lab 05/18/15 0530 05/19/15 0553 05/20/15 0320  NA 140 139 137  K 3.4* 3.3* 4.1  CL 105 101 104  CO2 27 30 28   BUN 10 14 19  CREATININE 0.50 0.60 0.61  GLUCOSE 112* 116* 198*    Electrolytes  Recent Labs Lab 05/18/15 0530 05/19/15 0553 05/20/15 0320  CALCIUM 8.8* 8.9 9.0    CBC  Recent Labs Lab 05/15/15 2037 05/18/15 0530 05/20/15 0320  WBC 10.0 8.9 12.9*  HGB 11.7* 9.3* 9.0*  HCT 38.5 31.8* 30.3*  PLT 382 297 317    Coag's No results for input(s): APTT, INR in the last 168 hours.  Sepsis Markers  Recent Labs Lab 05/16/15 1255 05/17/15 0500 05/18/15 0530  PROCALCITON <0.10 <0.10 <0.10    ABG  Recent Labs Lab 05/16/15 1249 05/16/15 1836 05/19/15 1249   PHART 7.298* 7.393 7.350  PCO2ART 59.4* 44.2 57.0*  PO2ART 115.0* 124.0* 96.0    Liver Enzymes  Recent Labs Lab 05/15/15 2037  AST 29  ALT 20  ALKPHOS 81  BILITOT 0.5  ALBUMIN 3.6    Cardiac Enzymes No results for input(s): TROPONINI, PROBNP in the last 168 hours.  Glucose  Recent Labs Lab 05/20/15 1128 05/20/15 1529 05/20/15 2013 05/20/15 2335 05/21/15 0419 05/21/15 0838  GLUCAP 167* 174* 162* 155* 158* 171*    Imaging No results found.   STUDIES:  CXR 3/14 >> Hypoinflation of the lungs with mild bibasilar subsegmental atelectasis   CULTURES: Urinalysis 3/14 >> Positive nitrites with trace leukocytes  Urine 3/14 >> E coli (R to cipro)  ANTIBIOTICS: Bactrim 3/18 >>   SIGNIFICANT EVENTS: 2/10 >> D/C from hospital  3/13 >> Onset of severe SOB and weakness  3/14 >> Present to ED  3/15 >> IVIG and ETT  3/18 >>Unsuccessful extubation , reintubated.   LINES/TUBES: ETT 3/15 >> 3/18; 3/18 >>  CVC 3/15 >>   DISCUSSION: 55 year old female presenting to the ED on 3/14 with increased SOB and weakness. Was diagnosed with MG in January 2017. Discharged on 2/10 with prednisone, patient states that she was unaware of the prescription and had not been taking. Neurology and PCCM consulted. PCCM to admit. Due to patient severe dysphagia, decreased NIF and VC and inability to protect airway patient will be intubated, after which IVIG will be started and home mestinon will be restarted via NG/OG.   ASSESSMENT / PLAN:  PULMONARY A: Acute respiratory failure due to flare of MG Reintubation 3/18 due to UA and VC edema P:   SBts with goal extubation if good cuff leak & VC improved Continue home albuterol prn  CARDIOVASCULAR A:  H/O HTN - on no home medications  P:  Telemetry Monitoring   RENAL A:   No acute problems  Hypokalemia  P:   Trend renal function panel  Electrolyte replacement as needed   GASTROINTESTINAL A:   Dysphagia  H/O GERD  P:    PPI Swallow Eval after extubation  NG/OG tube for medications  TF while intubated  HEMATOLOGIC A:   Anemia  P:  Trend CBC  Keep Hemoglobin greater 7  INFECTIOUS A:   E coli UTI P:   Bactrim started 3/18, plan 3 days Avoid foley   ENDOCRINE A:   Hyperglycemia    P:   SSI  Q4 CBG  NEUROLOGIC A:   Myasthenia Gravis exacerbation , completed IVIg day #5 days P:   Neurology following  Continue mestinon 60 q 6 RASS goal: 0 to -1   FAMILY  - Updates: none at bedside  - Inter-disciplinary family meet or Palliative Care meeting due by:  3/22  Independent CC time 68 minutes  Kara Mead MD. [facility]. [place]  Pulmonary & Critical care Pager [phone] If no response call [phone]   05/21/2015    05/21/2015, 9:25 AM

## 2015-05-21 NOTE — Evaluation (Signed)
Occupational Therapy Evaluation Patient Details Name: Frances Medina MRN: [id number] DOB: [birth year] Today's Date: 05/21/2015    History of Present Illness This 55 y. o female admitted with Mysthenic crisis.  Pt was intubed in  ED.  She was extubed 05/19/15, but reintubated due to pharyngeal edema causing stridor. PMH includes:  Miasthenia gravis diagnosed Jan. 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 She demonstrates the below listed deficits and will benefit from continued OT to maximize safety and independence with BADLs.  Pt presents to OT with generalized weakness.  She moves well with initial attempts at mobility - she required min A +2 (for line management) to transfer to recliner.  Anticipate excellent progress and that she should regain independence fairly quickly once extubated.  Will follow acutely       Follow Up Recommendations  No OT follow up;Supervision/Assistance - 24 hour (initially )    Equipment Recommendations  None recommended by OT    Recommendations for Other Services       Precautions / Restrictions Precautions Precautions: Fall Precaution Comments: Vent with ETT Restrictions Weight Bearing Restrictions: No      Mobility Bed Mobility Overal bed mobility: Needs Assistance Bed Mobility: Supine to Sit     Supine to sit: Min assist;+2 for safety/equipment;HOB elevated     General bed mobility comments: pt moving well and only needs A for trunk and scooting closer to EOB.  2 person A for management of lines.    Transfers Overall transfer level: Needs assistance Equipment used: 1 person hand held assist Transfers: Sit to/from [facility] Sit to Stand: Min assist;+2 safety/equipment Stand pivot transfers: Min assist;+2 safety/equipment       General transfer comment: 2nd person present for A with line management,  pt only  required MinA for balance with pivot to recliner.      Balance Overall balance assessment: Needs assistance Sitting-balance support: Feet supported Sitting balance-Leahy Scale: Fair     Standing balance support: Bilateral upper extremity supported Standing balance-Leahy Scale: Fair                              ADL Overall ADL's : Needs assistance/impaired Eating/Feeding: NPO   Grooming: Wash/dry hands;Wash/dry face;Set up;Bed level;Sitting   Upper Body Bathing: Minimal assitance;Sitting;Bed level   Lower Body Bathing: Moderate assistance;Sit to/from stand   Upper Body Dressing : Moderate assistance;Sitting   Lower Body Dressing: Moderate assistance;Sit to/from stand   Toilet Transfer: Minimal assistance;+2 for safety/equipment;Stand-pivot;BSC   Toileting- Clothing Manipulation and Hygiene: Moderate assistance;Sit to/from stand       Functional mobility during ADLs: Minimal assistance;+2 for safety/equipment General ADL Comments: Pt requires assist due to lines and ETT      Vision     Perception     Praxis      Pertinent Vitals/Pain Pain Assessment: No/denies pain     Hand Dominance     Extremity/Trunk Assessment Upper Extremity Assessment Upper Extremity Assessment: Generalized weakness   Lower Extremity Assessment Lower Extremity Assessment: Defer to PT evaluation   Cervical / Trunk Assessment Cervical / Trunk Assessment: Normal   Communication Communication Communication: Other (comment) (ETT)   Cognition Arousal/Alertness: Awake/alert Behavior During Therapy: WFL for tasks assessed/performed Overall Cognitive Status: Difficult to assess  General Comments       Exercises       Shoulder Instructions      Home Living Family/patient expects to be discharged to:: Private residence Living Arrangements: Spouse/significant other Available Help at Discharge: Family;Available PRN/intermittently Type of  Home: House Home Access: Stairs to enter [employer] of Steps: 3 Entrance Stairs-Rails: Left Home Layout: Two level;Able to live on main level with bedroom/bathroom Alternate Level Stairs-Number of Steps: flight Alternate Level Stairs-Rails: Right Bathroom Shower/Tub: Tub/shower unit Shower/tub characteristics: Curtain Biochemist, clinical: Standard     Home Equipment: Environmental consultant - 2 wheels;Cane - single point;Bedside commode;Shower seat (pt indicates she also has a bed pan.)          Prior Functioning/Environment Level of Independence: Independent with assistive device(s)        Comments: occasionally uses AD     OT Diagnosis: Generalized weakness   OT Problem List: Decreased strength;Decreased activity tolerance;Impaired balance (sitting and/or standing);Decreased knowledge of use of DME or AE;Cardiopulmonary status limiting activity   OT Treatment/Interventions: Self-care/ADL training;Therapeutic exercise;DME and/or AE instruction;Balance training;Patient/family education;Therapeutic activities    OT Goals(Current goals can be found in the care plan section) Acute Rehab OT Goals Patient Stated Goal: remove the tube (vent) OT Goal Formulation: With patient Time For Goal Achievement: 06/04/15 Potential to Achieve Goals: Good ADL Goals Pt Will Perform Grooming: with modified independence;standing Pt Will Perform Upper Body Bathing: with modified independence;sitting Pt Will Perform Lower Body Bathing: with modified independence;sit to/from stand Pt Will Perform Upper Body Dressing: with modified independence;sitting Pt Will Perform Lower Body Dressing: with modified independence;sit to/from stand Pt Will Transfer to Toilet: with modified independence;ambulating;regular height toilet;bedside commode;grab bars Pt Will Perform Toileting - Clothing Manipulation and hygiene: with modified independence;sit to/from stand Pt Will Perform Tub/Shower Transfer: Tub transfer;with  supervision;ambulating;shower seat;grab bars  OT Frequency: Min 2X/week   Barriers to D/C:            Co-evaluation PT/OT/SLP Co-Evaluation/Treatment: Yes Reason for Co-Treatment: Complexity of the patient's impairments (multi-system involvement);For patient/therapist safety PT goals addressed during session: Mobility/safety with mobility;Balance OT goals addressed during session: Strengthening/ROM;ADL's and self-care      End of Session Equipment Utilized During Treatment: Oxygen (vent ) Nurse Communication: Mobility status  Activity Tolerance: Patient tolerated treatment well Patient left: in chair;with call bell/phone within reach;with chair alarm set   Time: [id number] OT Time Calculation (min): 25 min Charges:  OT General Charges $OT Visit: 1 Procedure OT Evaluation $OT Eval Moderate Complexity: 1 Procedure G-Codes:    Landis Dowdy M 06/11/2015, 12:57 PM

## 2015-05-21 NOTE — Progress Notes (Signed)
RT NOTE  VC: 0.65L  NIF: -30  Good patient effort on mechanics. No cuff leak noted. MD aware.

## 2015-05-21 NOTE — Progress Notes (Signed)
FPTS Social Note  Patient still intubated. Last recorded Nif score was -20, VC 0.65L a.m. of 05/20/15.   Appreciate the wonderful care CCM and Neurology are providing. Will continue to follow along until Family Medicine can resume care.   Daughter Deseree had provided cell phone number [phone] for any big updates.   Rogue Bussing, MD 05/21/2015, 8:27 AM PGY-1, [facility] pager [phone]

## 2015-05-22 ENCOUNTER — Inpatient Hospital Stay (HOSPITAL_COMMUNITY): Payer: BC Managed Care – PPO

## 2015-05-22 LAB — BLOOD GAS, ARTERIAL
Acid-Base Excess: 7.3 mmol/L — ABNORMAL HIGH (ref 0.0–2.0)
Bicarbonate: 33.3 mEq/L — ABNORMAL HIGH (ref 20.0–24.0)
Drawn by: 313941
FIO2: 1
MECHVT: 370 mL
O2 Saturation: 99.6 %
PEEP: 5 cmH2O
Patient temperature: 97.4
RATE: 15 resp/min
TCO2: 35.3 mmol/L (ref 0–100)
pCO2 arterial: 64.4 mmHg (ref 35.0–45.0)
pH, Arterial: 7.329 — ABNORMAL LOW (ref 7.350–7.450)
pO2, Arterial: 197 mmHg — ABNORMAL HIGH (ref 80.0–100.0)

## 2015-05-22 LAB — CBC
HCT: 29.1 % — ABNORMAL LOW (ref 36.0–46.0)
Hemoglobin: 8.7 g/dL — ABNORMAL LOW (ref 12.0–15.0)
MCH: 24.4 pg — ABNORMAL LOW (ref 26.0–34.0)
MCHC: 29.9 g/dL — ABNORMAL LOW (ref 30.0–36.0)
MCV: 81.5 fL (ref 78.0–100.0)
Platelets: 328 10*3/uL (ref 150–400)
RBC: 3.57 MIL/uL — ABNORMAL LOW (ref 3.87–5.11)
RDW: 16.6 % — ABNORMAL HIGH (ref 11.5–15.5)
WBC: 11.9 10*3/uL — ABNORMAL HIGH (ref 4.0–10.5)

## 2015-05-22 LAB — GLUCOSE, CAPILLARY
Glucose-Capillary: 104 mg/dL — ABNORMAL HIGH (ref 65–99)
Glucose-Capillary: 107 mg/dL — ABNORMAL HIGH (ref 65–99)
Glucose-Capillary: 111 mg/dL — ABNORMAL HIGH (ref 65–99)
Glucose-Capillary: 113 mg/dL — ABNORMAL HIGH (ref 65–99)
Glucose-Capillary: 119 mg/dL — ABNORMAL HIGH (ref 65–99)
Glucose-Capillary: 150 mg/dL — ABNORMAL HIGH (ref 65–99)
Glucose-Capillary: 158 mg/dL — ABNORMAL HIGH (ref 65–99)

## 2015-05-22 LAB — BASIC METABOLIC PANEL
Anion gap: 9 (ref 5–15)
BUN: 34 mg/dL — ABNORMAL HIGH (ref 6–20)
CO2: 32 mmol/L (ref 22–32)
Calcium: 8.9 mg/dL (ref 8.9–10.3)
Chloride: 100 mmol/L — ABNORMAL LOW (ref 101–111)
Creatinine, Ser: 0.67 mg/dL (ref 0.44–1.00)
GFR calc Af Amer: >60 mL/min (ref >60)
GFR calc non Af Amer: >60 mL/min (ref >60)
Glucose, Bld: 110 mg/dL — ABNORMAL HIGH (ref 65–99)
Potassium: 3.4 mmol/L — ABNORMAL LOW (ref 3.5–5.1)
Sodium: 141 mmol/L (ref 135–145)

## 2015-05-22 LAB — TRIGLYCERIDES: Triglycerides: 127 mg/dL (ref <150)

## 2015-05-22 MED ORDER — FENTANYL CITRATE (PF) 100 MCG/2ML IJ SOLN
100.0000 ug | INTRAMUSCULAR | Status: DC | PRN
  Administered 2015-05-22 – 2015-06-02 (×25): 100 ug via INTRAVENOUS
  Filled 2015-05-22 (×18): qty 2

## 2015-05-22 MED ORDER — PROPOFOL 500 MG/50ML IV EMUL: 5.0000 ug/kg/min | Freq: Once | INTRAVENOUS | Status: AC

## 2015-05-22 MED ORDER — FENTANYL CITRATE (PF) 100 MCG/2ML IJ SOLN
100.0000 ug | Freq: Once | INTRAMUSCULAR | Status: AC
  Administered 2015-05-22: 100 ug via INTRAVENOUS

## 2015-05-22 MED ORDER — ROCURONIUM BROMIDE 50 MG/5ML IV SOLN
50.0000 mg | Freq: Once | INTRAVENOUS | Status: AC
  Administered 2015-05-22: 50 mg via INTRAVENOUS

## 2015-05-22 MED ORDER — MIDAZOLAM HCL 2 MG/2ML IJ SOLN
INTRAMUSCULAR | Status: AC
Start: 2015-05-22 — End: 2015-05-22
  Filled 2015-05-22: qty 4

## 2015-05-22 MED ORDER — FENTANYL CITRATE (PF) 100 MCG/2ML IJ SOLN
INTRAMUSCULAR | Status: AC
  Filled 2015-05-22: qty 4

## 2015-05-22 MED ORDER — POTASSIUM CHLORIDE 20 MEQ/15ML (10%) PO SOLN
40.0000 meq | Freq: Once | ORAL | Status: AC
  Administered 2015-05-23: 40 meq
  Filled 2015-05-22: qty 30

## 2015-05-22 MED ORDER — ETOMIDATE 2 MG/ML IV SOLN
40.0000 mg | Freq: Once | INTRAVENOUS | Status: AC
  Administered 2015-05-23: 20 mg via INTRAVENOUS
  Filled 2015-05-22: qty 20

## 2015-05-22 MED ORDER — FENTANYL CITRATE (PF) 100 MCG/2ML IJ SOLN
100.0000 ug | INTRAMUSCULAR | Status: AC | PRN
  Administered 2015-05-30 – 2015-05-31 (×3): 100 ug via INTRAVENOUS
  Filled 2015-05-22 (×10): qty 2

## 2015-05-22 MED ORDER — METHYLPREDNISOLONE SODIUM SUCC 125 MG IJ SOLR
80.0000 mg | Freq: Four times a day (QID) | INTRAMUSCULAR | Status: AC
  Administered 2015-05-22 – 2015-05-23 (×6): 80 mg via INTRAVENOUS
  Filled 2015-05-22 (×6): qty 2

## 2015-05-22 MED ORDER — PROPOFOL 1000 MG/100ML IV EMUL
0.0000 ug/kg/min | INTRAVENOUS | Status: DC
  Administered 2015-05-22: 15 ug/kg/min via INTRAVENOUS
  Administered 2015-05-22 (×2): 30 ug/kg/min via INTRAVENOUS
  Administered 2015-05-23: 25 ug/kg/min via INTRAVENOUS
  Administered 2015-05-23: 15 ug/kg/min via INTRAVENOUS
  Administered 2015-05-23: 35 ug/kg/min via INTRAVENOUS
  Administered 2015-05-24 (×2): 25 ug/kg/min via INTRAVENOUS
  Filled 2015-05-22 (×8): qty 100

## 2015-05-22 MED ORDER — ETOMIDATE 2 MG/ML IV SOLN
20.0000 mg | Freq: Once | INTRAVENOUS | Status: AC
  Administered 2015-05-22: 20 mg via INTRAVENOUS

## 2015-05-22 MED ORDER — VECURONIUM BROMIDE 10 MG IV SOLR
10.0000 mg | Freq: Once | INTRAVENOUS | Status: AC
  Administered 2015-05-23: 10 mg via INTRAVENOUS
  Filled 2015-05-22: qty 10

## 2015-05-22 MED ORDER — MIDAZOLAM HCL 2 MG/2ML IJ SOLN
4.0000 mg | Freq: Once | INTRAMUSCULAR | Status: AC
Start: 2015-05-22 — End: 2015-05-23
  Administered 2015-05-23: 2 mg via INTRAVENOUS
  Filled 2015-05-22: qty 4

## 2015-05-22 MED ORDER — FENTANYL CITRATE (PF) 100 MCG/2ML IJ SOLN
200.0000 ug | Freq: Once | INTRAMUSCULAR | Status: AC
  Administered 2015-05-23: 100 ug via INTRAVENOUS
  Filled 2015-05-22: qty 4

## 2015-05-22 MED ORDER — MIDAZOLAM HCL 2 MG/2ML IJ SOLN
4.0000 mg | Freq: Once | INTRAMUSCULAR | Status: AC
  Administered 2015-05-22: 4 mg via INTRAVENOUS

## 2015-05-22 NOTE — Progress Notes (Signed)
RT NOTE:  VC: 0.4 L NIF: -22  With good patient effort. MD aware.  No cuff leak noted.

## 2015-05-22 NOTE — Progress Notes (Signed)
PCP NOTE Sheri Prows,MD I stopped by to see Frances Medina this morning. She is sedated and I was unable to talk to her. I spoke with her nurse who stated that an attempt was made this morning to extubate her but she again went into stridor and was immediately intubated. Per documentation and per the nurse, the next step might be placing a trach. I spoke with her daughter about possibility of a trach, she will talk to her dad to discuss with the pulmonologist for the next step in her treatment plan. I appreciate the care given to my patient by the ICU team as well as the FMTS team.

## 2015-05-22 NOTE — Progress Notes (Signed)
PULMONARY / CRITICAL CARE MEDICINE   Name: Frances Medina MRN: [id number] DOB: 1961-01-19    ADMISSION DATE:  05/15/2015 CONSULTATION DATE: 05/16/2015  REFERRING MD:   CHIEF COMPLAINT: Weakness, dysphagia, SOB in setting of known MG   HISTORY OF PRESENT ILLNESS:  54 y.o. female presenting to ED on 3/14 with increased weakness, SOB, and dysphagia over the last 7 d PTA.. Of note, she was recently diagnosed with myasthenia gravis during hospitalization in January 2017, during which time she required treatment with IVIG and was started on pyridostigmine. She had another hospitalization for MG flare in February, also requiring treatment with IVIG and steroid taper, thought to have been triggered by a UTI. She was supposed to have continued prednisone upon last hospital discharge 04/13/15, but patient was not aware that she had a prescription. She called her neurologist Dr. Audelia Acton last Friday about her dysphagia, and he recommended increasing frequency of pyridostigmine 60 mg to q6h from q8h. He also noted that patient would need to start steroids or be seen in the ED. Patient had an appointment to see him 05/16/15 with plans to start prednisone, but with increasing fatigue, she decided to be evaluated in the ED. Swallowing has become more difficult since last Friday, 05/11/15. She was only able to eat "a few nuggets" today. She was, however, able to take all but her evening dose of pyridostigmine. She has been able to drink fluids but not as much as usual. She and her husband report that she has trouble with her speech if she talks too much. She also complains of chest pain under her breast that feels like "it's swollen" and cuts off her breath since yesterday. She has had rhinorrhea with post-nasal drip and cough in the middle of the night since last weak but has not taken any new OTC medications. No sick contacts.In the ED on 3/14, patient's vital signs were stable, and SpO2 was 99% on room air. She was to be  admitted to the ICU. Since presentation her swallowing fxn declined further w/ difficulty swallowing oral secretions. A NIF was recorded at -25, and her VC was 566ml. PCCM was consulted given concern for impending respiratory failure.   SUBJECTIVE:  Afebrile Denies pain Resting comfortably in chair- was able to ambulate with PT  VITAL SIGNS: BP 161/98 mmHg  Pulse 98  Temp(Src) 98.7 F (37.1 C) (Oral)  Resp 17  Ht 5' (1.524 m)  Wt 268 lb 4.8 oz (121.7 kg)  BMI 52.40 kg/m2  SpO2 99%  HEMODYNAMICS:    VENTILATOR SETTINGS: Vent Mode:  [-] PRVC FiO2 (%):  [40 %] 40 % Set Rate:  [14 bmp] 14 bmp Vt Set:  [370 mL] 370 mL PEEP:  [5 cmH20] 5 cmH20 Pressure Support:  [5 cmH20] 5 cmH20 Plateau Pressure:  [6 cmH20-17 cmH20] 12 cmH20  INTAKE / OUTPUT: I/O last 3 completed shifts: In: 2140 [I.V.:700; NG/GT:1440] Out: 20 [Urine:975]  PHYSICAL EXAMINATION: General:  55 year old aaf, awake, ventilated Neuro:  Awake, eyes open without ptosis, interacts, nods to questions, moves all extremities HEENT:  ETT in place, OP clear,  Cardiovascular:  rrr w/out MRG Lungs:  Clear w/out accessory muscle use.  Abdomen:  Soft, no OM, + bowel sounds Musculoskeletal:  Equal st and bulk Skin:  Warm and dry   LABS:  BMET  Recent Labs Lab 05/19/15 0553 05/20/15 0320 05/22/15 0500  NA 139 137 141  K 3.3* 4.1 3.4*  CL 101 104 100*  CO2 30 28  32  BUN 14 19 34*  CREATININE 0.60 0.61 0.67  GLUCOSE 116* 198* 110*    Electrolytes  Recent Labs Lab 05/19/15 0553 05/20/15 0320 05/22/15 0500  CALCIUM 8.9 9.0 8.9    CBC  Recent Labs Lab 05/18/15 0530 05/20/15 0320 05/22/15 0500  WBC 8.9 12.9* 11.9*  HGB 9.3* 9.0* 8.7*  HCT 31.8* 30.3* 29.1*  PLT 297 317 328    Coag's No results for input(s): APTT, INR in the last 168 hours.  Sepsis Markers  Recent Labs Lab 05/16/15 1255 05/17/15 0500 05/18/15 0530  PROCALCITON <0.10 <0.10 <0.10    ABG  Recent Labs Lab  05/16/15 1249 05/16/15 1836 05/19/15 1249  PHART 7.298* 7.393 7.350  PCO2ART 59.4* 44.2 57.0*  PO2ART 115.0* 124.0* 96.0    Liver Enzymes  Recent Labs Lab 05/15/15 2037  AST 29  ALT 20  ALKPHOS 81  BILITOT 0.5  ALBUMIN 3.6    Cardiac Enzymes No results for input(s): TROPONINI, PROBNP in the last 168 hours.  Glucose  Recent Labs Lab 05/21/15 1131 05/21/15 1542 05/21/15 2041 05/21/15 2359 05/22/15 0349 05/22/15 0836  GLUCAP 153* 131* 110* 107* 111* 104*    Imaging Dg Chest Port 1 View  05/22/2015  CLINICAL DATA:  Acute respiratory failure EXAM: PORTABLE CHEST 1 VIEW COMPARISON:  05/21/15 FINDINGS: Cardiomediastinal silhouette is stable. Endotracheal tube in place with tip 2.5 cm above the carina. NG tube in place. Right IJ central line is unchanged in position. Central mild vascular congestion without convincing pulmonary edema persistent mild basilar atelectasis. No segmental infiltrate. IMPRESSION: Stable support apparatus. Central mild vascular congestion without convincing pulmonary edema. Persistent bilateral basilar atelectasis. Electronically Signed   By: Lahoma Crocker M.D.   On: 05/22/2015 07:50   Dg Chest Port 1 View  05/21/2015  CLINICAL DATA:  Acute respiratory failure EXAM: PORTABLE CHEST 1 VIEW COMPARISON:  05/19/2015 FINDINGS: Endotracheal tube terminates 5 cm above the carina. Mild bibasilar opacities, likely atelectasis. No frank interstitial edema. No pleural effusion or pneumothorax. The heart is normal in size. Right IJ venous catheter terminates the cavoatrial junction. Enteric tube courses into the mid stomach. IMPRESSION: Endotracheal tube terminates 5 cm above the carina. Mild bibasilar opacities, likely atelectasis. No frank interstitial edema. Electronically Signed   By: Julian Hy M.D.   On: 05/21/2015 10:45     STUDIES:  CXR 3/14 >> Hypoinflation of the lungs with mild bibasilar subsegmental atelectasis   CULTURES: Urinalysis 3/14 >>  Positive nitrites with trace leukocytes  Urine 3/14 >> E coli (R to cipro)  ANTIBIOTICS: Bactrim 3/18 >>   SIGNIFICANT EVENTS: 2/10 >> D/C from hospital  3/13 >> Onset of severe SOB and weakness  3/14 >> Present to ED  3/15 >> IVIG and ETT  3/18 >>Unsuccessful extubation , reintubated.   LINES/TUBES: ETT 3/15 >> 3/18; 3/18 >>  CVC 3/15 >>   DISCUSSION: 55 year old female presenting to the ED on 3/14 with increased SOB and weakness. Was diagnosed with MG in January 2017. Discharged on 2/10 with prednisone, patient states that she was unaware of the prescription and had not been taking. Neurology and PCCM consulted. PCCM to admit. Due to patient severe dysphagia, decreased NIF and VC and inability to protect airway patient will be intubated, after which IVIG will be started and home mestinon will be restarted via NG/OG.   ASSESSMENT / PLAN:  PULMONARY A: Acute respiratory failure due to flare of MG Reintubation 3/18 due to UA and VC edema P:  SBts with goal extubation- cuff leak is small & VC improved Continue home albuterol prn  CARDIOVASCULAR A:  H/O HTN - on no home medications  P:  Telemetry Monitoring   RENAL A:   Hypokalemia  P:   Electrolyte replacement as needed   GASTROINTESTINAL A:   Dysphagia  H/O GERD  P:   PPI Swallow Eval after extubation  NG/OG tube for medications  TF while intubated  HEMATOLOGIC A:   Anemia  P:  Trend CBC  Keep Hemoglobin greater 7  INFECTIOUS A:   E coli UTI P:   Bactrim started 3/18, plan 3 days Avoid foley   ENDOCRINE A:   Hyperglycemia    P:   SSI  Q4 CBG  NEUROLOGIC A:   Myasthenia Gravis exacerbation , completed IVIg day #5 days P:   Neurology following  Continue mestinon 60 q 6 RASS goal: 0 to -1   FAMILY  - Updates: none at bedside  - Inter-disciplinary family meet or Palliative Care meeting due by:  3/22  Independent CC time 69 minutes  Kara Mead MD. [facility]. [facility] Pager [phone] If no response call [phone]   05/22/2015    05/22/2015, 9:39 AM

## 2015-05-22 NOTE — Progress Notes (Signed)
Physical Therapy Treatment Patient Details Name: Frances Medina MRN: [id number] DOB: [birth year] Today's Date: 05/22/2015    History of Present Illness pt presents with c/o difficulty swallowing for several days and SOB.  pt found to be in Myasthenia Gravis Flare up with recent diagnosis of Myasthenia Gravis in January and a flare up in February.  pt intubated on 3/15, extubated 3/18 with re-intubation on 3/18 and on vent for Eval.  pt with hx of HTN, Depression, Anxiety, and Migraines.  pt currently undergoing work-up for Splenomegaly with Lymphadenopathy.      PT Comments    Pt making great progress with mobility today and was able to ambulate in hallway with A from RT for Vent management.  Pt did have increased oral secretions and coughing during session, and RT deep suctioned prior to ambulation with minimal secretions.  Pt's HR increased today during session ranging 120's to 140s during mobility.  RN aware.  Pt remains on pressure support on Vent without difficulty during session.  Continue to feel that as respiratory status improves pt will be able to progress to return to home at D/C.  Will continue to follow.    Follow Up Recommendations  Home health PT;Supervision - Intermittent     Equipment Recommendations  None recommended by PT    Recommendations for Other Services       Precautions / Restrictions Precautions Precautions: Fall Precaution Comments: Vent with ETT Restrictions Weight Bearing Restrictions: No    Mobility  Bed Mobility Overal bed mobility: Needs Assistance Bed Mobility: Supine to Sit     Supine to sit: Min assist;HOB elevated     General bed mobility comments: pt only needed A for bringing trunk up to sitting today.    Transfers Overall transfer level: Needs assistance Equipment used: Rolling walker (2 wheeled);1 person hand held assist Transfers: Sit to/from [facility] Sit to Stand: Min assist;+2 safety/equipment Stand pivot  transfers: Min assist;+2 safety/equipment       General transfer comment: 2nd person present for A with line management,  pt only required MinA for balance with pivot to 3-in-1.  Utilized RW for stand from 3-in-1 in preparation for ambulation.  Ambulation/Gait Ambulation/Gait assistance: Min assist;+2 safety/equipment (+3 including RT for Vent management) Ambulation Distance (Feet): 50 Feet Assistive device: Rolling walker (2 wheeled) Gait Pattern/deviations: Step-through pattern;Decreased stride length     General Gait Details: pt moves slowly and with reliance on UE support.  pt with increased oral secretions during mobility and increased HR up to 140's during ambulation.  Chair follow and RT present for Vent management.     Stairs            Wheelchair Mobility    Modified Rankin (Stroke Patients Only)       Balance Overall balance assessment: Needs assistance Sitting-balance support: No upper extremity supported;Feet supported Sitting balance-Leahy Scale: Fair     Standing balance support: Single extremity supported;Bilateral upper extremity supported;During functional activity Standing balance-Leahy Scale: Fair                      Cognition Arousal/Alertness: Awake/alert Behavior During Therapy: WFL for tasks assessed/performed Overall Cognitive Status: Difficult to assess                      Exercises      General Comments General comments (skin integrity, edema, etc.): pt's HR ranged from 120s to 140s during session.  Systolic BP [id number] to [id number].  pt with increased oral secretions and coughing.  RT deep suctioned x1 while on 3-in-1 with only minimal secretions suctioned.        Pertinent Vitals/Pain Pain Assessment: No/denies pain    Home Living                      Prior Function            PT Goals (current goals can now be found in the care plan section) Acute Rehab PT Goals Patient Stated Goal: remove the tube  (vent) PT Goal Formulation: With patient Time For Goal Achievement: 06/04/15 Potential to Achieve Goals: Good Progress towards PT goals: Progressing toward goals    Frequency  Min 3X/week    PT Plan Current plan remains appropriate    Co-evaluation             End of Session Equipment Utilized During Treatment: Gait belt;Other (comment) (Vent) Activity Tolerance: Patient tolerated treatment well Patient left: in chair;with call bell/phone within reach     Time: [id number] PT Time Calculation (min) (ACUTE ONLY): 36 min  Charges:  $Gait Training: 8-22 mins $Therapeutic Activity: 8-22 mins                    G CodesCatarina Hartshorn, [place] 05/22/2015, 9:58 AM

## 2015-05-22 NOTE — Progress Notes (Signed)
Notified MD regarding patient's headache. Patient is currently intubated but able to communicate needs. Patient rated pain 10/10 in head despite administering fentanyl. New order received. RN will continue to monitor patient closely.

## 2015-05-22 NOTE — Progress Notes (Signed)
FPTS Social Note  Patient was extubated then reintubated this morning. Last recorded Nif score was -22, VC 0.4 L a.m. of 05/22/15. Spoke with patient's nurse who mentioned tracheostomy may be the next step. Spoke with patient's daughter Frances Medina, who requested that primary team speak with her father and brother; asked patient's nurse to help coordinate.   Appreciate the wonderful care CCM and Neurology are providing. Will continue to follow along until Family Medicine can resume care.   Daughter Frances Medina had provided cell phone number [phone] for any big updates.   Rogue Bussing, MD 05/22/2015, 1:19 PM PGY-1, [facility] pager [phone]

## 2015-05-22 NOTE — Progress Notes (Signed)
Upon assessment, patient medial central line port unable to flush. No blood return noted. Cap changed. Still unable to flush. IV consult placed.   IV team unable to un-occlude port. Will pass along to day shift RN.

## 2015-05-22 NOTE — Procedures (Signed)
Extubation Procedure Note  Patient Details:   Name: Frances Medina DOB: Dec 20, 1960 MRN: [id number]   Airway Documentation:     Evaluation  O2 sats: Transiently fell after procedure Complications: Complications of immediate reintubation Patient did not tolerate procedure well. Bilateral Breath Sounds: Rhonchi Suctioning: Airway Yes   CCM was aware that patient didn't have a cuff leak prior to extubation. Pt. Developed stridor & was placed on 100% NRB. CCM was made aware & the patient was immediately reintubated.   Frances Medina, Frances Medina 05/22/2015, 10:23 AM

## 2015-05-22 NOTE — Procedures (Signed)
Intubation Procedure Note NANI DUNCAN [id number] 02-12-1961  Procedure: Intubation Indications: Respiratory insufficiency  Procedure Details Consent: Risks of procedure as well as the alternatives and risks of each were explained to the (patient/caregiver).  Consent for procedure obtained. Time Out: Verified patient identification, verified procedure, site/side was marked, verified correct patient position, special equipment/implants available, medications/allergies/relevent history reviewed, required imaging and test results available.  Performed  Maximum sterile technique was used including gloves, hand hygiene and mask.  MAC and 3  Versed 4 fent 100 Etomidate 20 Rocuronium 50 given after intubation  Evaluation Hemodynamic Status: BP stable throughout; O2 sats: stable throughout Patient's Current Condition: stable Complications: No apparent complications Patient did tolerate procedure well. Chest X-ray ordered to verify placement.  CXR: pending.   Rigoberto Noel MD 05/22/2015

## 2015-05-22 NOTE — Progress Notes (Signed)
[facility] ICU Electrolyte Replacement Protocol  Patient Name: Frances Medina DOB: December 16, 1960 MRN: [id number]  Date of Service  05/22/2015   HPI/Events of Note    Recent Labs Lab 05/15/15 2037 05/18/15 0530 05/19/15 0553 05/20/15 0320 05/22/15 0500  NA 142 140 139 137 141  K 3.6 3.4* 3.3* 4.1 3.4*  CL 104 105 101 104 100*  CO2 '27 27 30 28 ' 32  GLUCOSE 107* 112* 116* 198* 110*  BUN '7 10 14 19 ' 34*  CREATININE 0.69 0.50 0.60 0.61 0.67  CALCIUM 9.4 8.8* 8.9 9.0 8.9    Estimated Creatinine Clearance: 96.5 mL/min (by C-G formula based on Cr of 0.67).  Intake/Output      03/20 0701 - 03/21 0700   I.V. (mL/kg) 100 (0.8)   NG/GT 920   Total Intake(mL/kg) 1020 (8.4)   Urine (mL/kg/hr) 975 (0.3)   Stool 0 (0)   Total Output 975   Net +45       Urine Occurrence 1 x   Stool Occurrence 1 x    - I/O DETAILED x24h    Total I/O In: 440 [NG/GT:440] Out: 375 [Urine:375] - I/O THIS SHIFT    ASSESSMENT   eICURN Interventions  Criteria not met for electrolyte protocol. MD notified   ASSESSMENT: MAJOR ELECTROLYTE    Lorene Dy 05/22/2015, 6:49 AM

## 2015-05-23 ENCOUNTER — Inpatient Hospital Stay (HOSPITAL_COMMUNITY): Payer: BC Managed Care – PPO

## 2015-05-23 LAB — BASIC METABOLIC PANEL
Anion gap: 11 (ref 5–15)
BUN: 26 mg/dL — ABNORMAL HIGH (ref 6–20)
CO2: 32 mmol/L (ref 22–32)
Calcium: 8.9 mg/dL (ref 8.9–10.3)
Chloride: 99 mmol/L — ABNORMAL LOW (ref 101–111)
Creatinine, Ser: 0.62 mg/dL (ref 0.44–1.00)
GFR calc Af Amer: >60 mL/min (ref >60)
GFR calc non Af Amer: >60 mL/min (ref >60)
Glucose, Bld: 154 mg/dL — ABNORMAL HIGH (ref 65–99)
Potassium: 4 mmol/L (ref 3.5–5.1)
Sodium: 142 mmol/L (ref 135–145)

## 2015-05-23 LAB — GLUCOSE, CAPILLARY
Glucose-Capillary: 152 mg/dL — ABNORMAL HIGH (ref 65–99)
Glucose-Capillary: 143 mg/dL — ABNORMAL HIGH (ref 65–99)
Glucose-Capillary: 142 mg/dL — ABNORMAL HIGH (ref 65–99)
Glucose-Capillary: 155 mg/dL — ABNORMAL HIGH (ref 65–99)
Glucose-Capillary: 132 mg/dL — ABNORMAL HIGH (ref 65–99)

## 2015-05-23 LAB — CBC
HCT: 29.3 % — ABNORMAL LOW (ref 36.0–46.0)
Hemoglobin: 9.2 g/dL — ABNORMAL LOW (ref 12.0–15.0)
MCH: 25.1 pg — ABNORMAL LOW (ref 26.0–34.0)
MCHC: 31.4 g/dL (ref 30.0–36.0)
MCV: 79.8 fL (ref 78.0–100.0)
Platelets: 359 10*3/uL (ref 150–400)
RBC: 3.67 MIL/uL — ABNORMAL LOW (ref 3.87–5.11)
RDW: 15.8 % — ABNORMAL HIGH (ref 11.5–15.5)
WBC: 12.7 10*3/uL — ABNORMAL HIGH (ref 4.0–10.5)

## 2015-05-23 LAB — PROTIME-INR
INR: 1.19 (ref 0.00–1.49)
Prothrombin Time: 15.3 seconds — ABNORMAL HIGH (ref 11.6–15.2)

## 2015-05-23 LAB — APTT: aPTT: 32 seconds (ref 24–37)

## 2015-05-23 MED ORDER — FAMOTIDINE 40 MG/5ML PO SUSR
20.0000 mg | Freq: Two times a day (BID) | ORAL | Status: DC
  Administered 2015-05-23 – 2015-06-05 (×27): 20 mg
  Filled 2015-05-23 (×5): qty 2.5
  Filled 2015-05-23 (×2): qty 3
  Filled 2015-05-23 (×4): qty 2.5
  Filled 2015-05-23: qty 3
  Filled 2015-05-23 (×12): qty 2.5
  Filled 2015-05-23: qty 3
  Filled 2015-05-23: qty 2.5
  Filled 2015-05-23: qty 3
  Filled 2015-05-23 (×5): qty 2.5
  Filled 2015-05-23: qty 3
  Filled 2015-05-23 (×2): qty 2.5

## 2015-05-23 MED ORDER — HEPARIN SODIUM (PORCINE) 5000 UNIT/ML IJ SOLN
5000.0000 [IU] | Freq: Three times a day (TID) | INTRAMUSCULAR | Status: DC
  Administered 2015-05-23 – 2015-06-08 (×44): 5000 [IU] via SUBCUTANEOUS
  Filled 2015-05-23 (×42): qty 1

## 2015-05-23 MED ORDER — CHLORHEXIDINE GLUCONATE 0.12 % MT SOLN
OROMUCOSAL | Status: AC
  Filled 2015-05-23: qty 15

## 2015-05-23 MED ORDER — CHLORHEXIDINE GLUCONATE 0.12% ORAL RINSE (MEDLINE KIT)
15.0000 mL | Freq: Two times a day (BID) | OROMUCOSAL | Status: DC
  Administered 2015-05-23 – 2015-05-28 (×12): 15 mL via OROMUCOSAL

## 2015-05-23 MED ORDER — ANTISEPTIC ORAL RINSE SOLUTION (CORINZ)
7.0000 mL | OROMUCOSAL | Status: DC
  Administered 2015-05-23 – 2015-05-28 (×54): 7 mL via OROMUCOSAL

## 2015-05-23 NOTE — Progress Notes (Addendum)
PULMONARY / CRITICAL CARE MEDICINE   Name: Frances Medina MRN: [id number] DOB: 15-Mar-1960    ADMISSION DATE:  05/15/2015 CONSULTATION DATE: 05/16/2015  REFERRING MD:   CHIEF COMPLAINT: Weakness, dysphagia, SOB in setting of known MG   HISTORY OF PRESENT ILLNESS:  55 y.o. female presenting to ED on 3/14 with increased weakness, SOB, and dysphagia over the last 7 d PTA.. Of note, she was recently diagnosed with myasthenia gravis during hospitalization in January 2017, during which time she required treatment with IVIG and was started on pyridostigmine. She had another hospitalization for MG flare in February, also requiring treatment with IVIG and steroid taper, thought to have been triggered by a UTI. She was supposed to have continued prednisone upon last hospital discharge 04/13/15, but patient was not aware that she had a prescription. She called her neurologist Dr. Audelia Acton last Friday about her dysphagia, and he recommended increasing frequency of pyridostigmine 60 mg to q6h from q8h. He also noted that patient would need to start steroids or be seen in the ED. Patient had an appointment to see him 05/16/15 with plans to start prednisone, but with increasing fatigue, she decided to be evaluated in the ED. Swallowing has become more difficult since last Friday, 05/11/15. She was only able to eat "a few nuggets" today. She was, however, able to take all but her evening dose of pyridostigmine. She has been able to drink fluids but not as much as usual. She and her husband report that she has trouble with her speech if she talks too much. She also complains of chest pain under her breast that feels like "it's swollen" and cuts off her breath since yesterday. She has had rhinorrhea with post-nasal drip and cough in the middle of the night since last weak but has not taken any new OTC medications. No sick contacts.In the ED on 3/14, patient's vital signs were stable, and SpO2 was 99% on room air. She was to be  admitted to the ICU. Since presentation her swallowing fxn declined further w/ difficulty swallowing oral secretions. A NIF was recorded at -25, and her VC was 576ml. PCCM was consulted given concern for impending respiratory failure.   SUBJECTIVE: Patient failed extubation again yesterday. No acute events overnight. Planned for tracheostomy today.   REVIEW OF SYSTEMS:  Unable to obtain given sedation & ventilator requirement.  VITAL SIGNS: BP 115/76 mmHg  Pulse 63  Temp(Src) 98.7 F (37.1 C) (Oral)  Resp 18  Ht 5' (1.524 m)  Wt 117.9 kg (259 lb 14.8 oz)  BMI 50.76 kg/m2  SpO2 100%  HEMODYNAMICS:    VENTILATOR SETTINGS: Vent Mode:  [-] PRVC FiO2 (%):  [40 %-100 %] 40 % Set Rate:  [15 bmp-18 bmp] 18 bmp Vt Set:  [370 mL] 370 mL PEEP:  [5 cmH20] 5 cmH20 Plateau Pressure:  [10 cmH20-23 cmH20] 12 cmH20  INTAKE / OUTPUT: I/O last 3 completed shifts: In: 1072 [I.V.:452; NG/GT:620] Out: 1075 [Urine:475; Other:600]  PHYSICAL EXAMINATION: General:  Eyes closed.  No family at bedside.  Integument:  Warm & dry. No rash on exposed skin. No bruising. HEENT:  No scleral injection or icterus. Endotracheal tube in place.  Cardiovascular:  Regular rate. No edema. No appreciable JVD.  Pulmonary:  Good aeration & clear to auscultation bilaterally. Symmetric chest wall rise on ventilator. Abdomen: Soft. Normal bowel sounds. Nondistended.  Neurological:  Withdrawals to pain in all 4 extremities. Sedated. Pupils equal.  LABS:  BMET  Recent Labs Lab 05/20/15  0320 05/22/15 0500 05/23/15 0600  NA 137 141 142  K 4.1 3.4* 4.0  CL 104 100* 99*  CO2 28 32 32  BUN 19 34* 26*  CREATININE 0.61 0.67 0.62  GLUCOSE 198* 110* 154*    Electrolytes  Recent Labs Lab 05/20/15 0320 05/22/15 0500 05/23/15 0600  CALCIUM 9.0 8.9 8.9    CBC  Recent Labs Lab 05/20/15 0320 05/22/15 0500 05/23/15 0600  WBC 12.9* 11.9* 12.7*  HGB 9.0* 8.7* 9.2*  HCT 30.3* 29.1* 29.3*  PLT 317 328 359     Coag's  Recent Labs Lab 05/23/15 0600  APTT 32  INR 1.19    Sepsis Markers  Recent Labs Lab 05/16/15 1255 05/17/15 0500 05/18/15 0530  PROCALCITON <0.10 <0.10 <0.10    ABG  Recent Labs Lab 05/16/15 1836 05/19/15 1249 05/22/15 1220  PHART 7.393 7.350 7.329*  PCO2ART 44.2 57.0* 64.4*  PO2ART 124.0* 96.0 197*    Liver Enzymes No results for input(s): AST, ALT, ALKPHOS, BILITOT, ALBUMIN in the last 168 hours.  Cardiac Enzymes No results for input(s): TROPONINI, PROBNP in the last 168 hours.  Glucose  Recent Labs Lab 05/22/15 0836 05/22/15 1155 05/22/15 1527 05/22/15 1932 05/22/15 2327 05/23/15 0352  GLUCAP 104* 119* 113* 158* 150* 152*    Imaging Portable Chest Xray  05/23/2015  CLINICAL DATA:  55 year old female female with respiratory failure. Myasthenia gravis. Initial encounter. EXAM: PORTABLE CHEST 1 VIEW COMPARISON:  05/22/2015 and earlier. FINDINGS: Portable AP semi upright view at 0604 hours. Stable endotracheal tube tip at the level the clavicles. Enteric tube now in place, courses to the abdomen and tip not included. Multiple EKG leads and wires also overlie the chest. Stable right IJ central line. Continued low lung volumes with right greater than left basilar veiling opacity. No pneumothorax. No acute pulmonary edema. Mediastinal contours remain within normal limits. IMPRESSION: 1. Enteric tube placed and courses to the abdomen, tip not included. Otherwise, stable lines and tubes. 2. Stable ventilation. Low lung volumes with bibasilar opacity probably reflecting atelectasis and small effusions. Electronically Signed   By: Genevie Ann M.D.   On: 05/23/2015 07:36   Portable Chest Xray  05/22/2015  CLINICAL DATA:  Re-intubation today EXAM: PORTABLE CHEST 1 VIEW COMPARISON:  05/22/2015 FINDINGS: Unchanged position of endotracheal tube and internal jugular central line. Cardiac enlargement stable. Bibasilar hypoventilatory change with low lung volumes.  IMPRESSION: Bilateral lower lobe atelectasis. Findings similar to prior study, with mildly increased opacity at the bases. Electronically Signed   By: Skipper Cliche M.D.   On: 05/22/2015 12:14   Dg Abd Portable 1v  05/22/2015  CLINICAL DATA:  Feeding tube placement EXAM: PORTABLE ABDOMEN - 1 VIEW COMPARISON:  05/19/2015 FINDINGS: NG tube is been exchange for a feeding tube. Feeding tube tip is in the distal fourth portion of the duodenum. Normal bowel gas pattern. No significant dilatation or obstruction. No acute osseous finding. IMPRESSION: Feeding tube tip distal duodenum. Electronically Signed   By: Jerilynn Mages.  Shick M.D.   On: 05/22/2015 15:29     STUDIES:  CXR 3/14 >> Hypoinflation of the lungs with mild bibasilar subsegmental atelectasis  Abd X-ray 3/21: Feeding tube with tip in distal duodenum. Port CXR 3/22: Right internal jugular central venous catheter in good position. Endotracheal tube in good position. Low lung volumes. Minimal basilar atelectasis.  MICROBIOLOGY: Urine Ctx 3/14:  E coli MRSA Nasal Ctx 3/15:  Negative   ANTIBIOTICS: Bactrim 3/18 - 3/20   SIGNIFICANT EVENTS: 2/10 >> D/C  from hospital  3/13 >> Onset of severe SOB and weakness  3/14 >> Present to ED  3/15 >> IVIG and ETT  3/18 >> Unsuccessful extubation , reintubated.  3/21 >> Unsuccessful extubation, reintubated  LINES/TUBES: OETT 7.0 3/15 - 3/18; 3/18 - 3/21; 3/21>> R IJ CVL 3/15 >>  L Nare NGT 3/21>>>  ASSESSMENT / PLAN:  PULMONARY A: Acute Respiratory Failure due to flare of MG - Reintubated twice.  P:   Full Vent Support Plan for tracheostomy placement today  CARDIOVASCULAR A:  H/O HTN - On no home medications.   P:  Monitor on telemetry Vitals per unit protocol  RENAL A:   Hypokalemia - Resolved.  P:   Avoiding Foley Daily weights Trending renal function & electrolytes daily  GASTROINTESTINAL A:   Dysphagia  H/O GERD   P:  Plan to restart tube feedings Pepcid VT  bid  HEMATOLOGIC A:   Anemia - No signs of active bleeding. Leukocytosis - Mild.  P:  Trending cell counts daily w/ CBC Transfuse for Hgb <7.0 SCDs D/C Lovenox Start Heparin [place] q8hr  INFECTIOUS A:   E coli UTI  P:   Avoiding Foley Monitor for fever & leukocytosis  ENDOCRINE A:   Hyperglycemia - BG stable.  P:   Accu-Checks q4hr Low dose SSI per algorithm  NEUROLOGIC A:   Myasthenia Gravis Exacerbation - Complete 5 days IVIG (3/19).  P:   Neurology following  Continue mestinon 60 q 6 RASS goal: 0 to -1 Propofol gtt Fentanyl IV prn  Continue Solu-Medrol IV every 6 hours  FAMILY  - Updates: none at bedside  - Inter-disciplinary family meet or Palliative Care meeting due by:  3/22  TODAY'S SUMMARY:  55 year old female with myasthenia crisis. Patient attempted extubation twice with failure. Plan for tracheostomy placement today. Continuing on treatment per neurology recommendations.  I have spent a total of 32 minutes of critical care time today caring for the patient & reviewing the patient's electronic medical record.  Sonia Baller Ashok Cordia, M.D. [facility] Pulmonary & Critical Care Pager:  [phone] After 3pm or if no response, call [phone] 05/23/2015, 8:43 AM

## 2015-05-23 NOTE — Procedures (Signed)
Bronchoscopy  for Percutaneous  Tracheostomy  Name: VALERYA DERSHEM MRN: [id number] DOB: Oct 13, 1960 Procedure: Bronchoscopy for Percutaneous Tracheostomy Indications: Diagnostic evaluation of the airways In conjunction with: Dr. Titus Mould   Procedure Details Consent: Risks of procedure as well as the alternatives and risks of each were explained to the (patient/caregiver).  Consent for procedure obtained. Time Out: Verified patient identification, verified procedure, site/side was marked, verified correct patient position, special equipment/implants available, medications/allergies/relevent history reviewed, required imaging and test results available.  Performed  In preparation for procedure, patient was given 100% FiO2 and bronchoscope lubricated. Sedation: Etomidate, Vecuronium  Airway entered and the following bronchi were examined: RUL, RML, RLL, LUL and LLL.   Procedures performed: Endotracheal Tube retracted in 2 cm increments. Cannulation of airway observed. Dilation observed. Placement of trachel tube  observed . No overt complications. Bronchoscope placed through tracheostomy and carina identified. Bronchoscope removed.    Evaluation Hemodynamic Status: BP stable throughout; O2 sats: stable throughout Patient's Current Condition: stable Specimens:  None Complications: No apparent complications Patient did tolerate procedure well.   Georgann Housekeeper, AGACNP-BC [place] Pulmonology/Critical Care Pager [phone] or [phone]  05/23/2015 3:44 PM  Rush Farmer, M.D. [facility] Pulmonary/Critical Care Medicine. Pager: [phone]. After hours pager: [phone].

## 2015-05-23 NOTE — Progress Notes (Signed)
NIF and VC performed with patient, NIF -15, VC 670 with good patient effort.

## 2015-05-23 NOTE — Progress Notes (Signed)
SLP Cancellation Note  Patient Details Name: Frances Medina MRN: [id number] DOB: 09/10/1960   Cancelled treatment:       Reason Eval/Treat Not Completed: Patient not medically ready. Patient extubated, then re-intubated. No receiving tracheostomy. Will f/u as part of trach team rounding.   [name], CCC-SLP [phone]    [name] 05/23/2015, 8:44 AM

## 2015-05-23 NOTE — Progress Notes (Signed)
OT Cancellation Note  Patient Details Name: BEZAWIT BRUMMER MRN: [id number] DOB: 1960/10/15   Cancelled Treatment:    Reason Eval/Treat Not Completed: Patient at procedure or test/ unavailable Pt for trach.    Darlina Rumpf [place], OTR/L [id number]  05/23/2015, 2:08 PM

## 2015-05-23 NOTE — Progress Notes (Signed)
NIF and VC performed with good patient effort.  NIF of -15.  VC of 711.

## 2015-05-23 NOTE — Progress Notes (Signed)
Subjective: Intubated. No complaints. [name] NIF of -15. VC of 711.  Exam: Filed Vitals:   05/23/15 0739 05/23/15 0800  BP: 102/66 115/76  Pulse: 70 63  Temp:  98.9 F (37.2 C)  Resp: 20 18    HEENT-  Normocephalic, no lesions, without obvious abnormality.  Normal external eye and conjunctiva.  Normal TM's bilaterally.  Normal auditory canals and external ears. Normal external nose, mucus membranes and septum.  Normal pharynx. Cardiovascular- S1, S2 normal, pulses palpable throughout   Lungs- chest clear, no wheezing, rales, normal symmetric air entry Abdomen- normal findings: bowel sounds normal Extremities- no edema Lymph-no adenopathy palpable Musculoskeletal-no joint tenderness, deformity or swelling Skin-warm and dry, no hyperpigmentation, vitiligo, or suspicious lesions    Gen: In bed, NAD MS: alert and able to follow commands along with make gestures as she is intubated.  [id number] throughout with ptosis on the right eye Motor: 4/5 throughout Sensory: intact throughout DTR: 1+ throughout  Pertinent Labs: none  Etta Quill PA-C [facility] [phone]  Impression: MG exacerbation. Currently 3 days S/P IVIG, on Mestinon 60 MG 4 times daily. Recently had 3 days of Bactrim for UTI. re intubated on 3/18 due to UTI and VC edema. Currently continues to have weak NIF and VC.    Recommendations: 1)At this time will continue with current dose of Mestinon. Solumedrol will be D/C'd after 1700 dose today. Will continue to follow with you from [place]. Please feel free to contact us with questions.      .As a rule, the listed drugs should be avoided whenever possible, and patients with MG should be followed closely when any new drug is introduced.   Drugs that may exacerbate MG   Antibiotics   Aminoglycosides: e.g., streptomycin, tobramycin, kanamycin   Quinolones: e.g., ciprofloxacin, levofloxacin, ofloxacin, gatifloxacin   Macrolides: e.g., erythromycin,  azithromycin, telithromycin   Nondepolarizing muscle relaxants for surgery   d-Tubocurarine (curare), pancuronium, vecuronium, atracurium   Beta-blocking agents   Propranolol, atenolol, metoprolol   Local anesthetics and related agents   Procaine, Xylocaine in large amounts   Procainamide (for arrhythmias)   Botulinum toxin   Botox exacerbates weakness.   Quinine derivatives   Quinine, quinidine, chloroquine, mefloquine (Lariam)   Magnesium   Decreases ACh release   Penicillamine   May cause MG   Drugs with important interactions in MG   Cyclosporine   Broad range of drug interactions, which may raise or lower cyclosporine levels   Azathioprine   Avoid allopurinol; combination may result in myelosuppression.      05/23/2015, 10:13 AM

## 2015-05-23 NOTE — Procedures (Signed)
Bedside Tracheostomy Insertion Procedure Note   Patient Details:   Name: Frances Medina DOB: 18-Dec-1960 MRN: [id number]  Procedure: Tracheostomy  Pre Procedure Assessment: ET Tube Size:7.0 ET Tube secured at lip (cm):22 Bite block in place: No Breath Sounds: Clear  Post Procedure Assessment: BP 123/81 mmHg  Pulse 88  Temp(Src) 99.1 F (37.3 C) (Axillary)  Resp 27  Ht 5' (1.524 m)  Wt 259 lb 14.8 oz (117.9 kg)  BMI 50.76 kg/m2  SpO2 100% O2 sats: stable throughout Complications: No apparent complications Patient did tolerate procedure well Tracheostomy Brand:Shiley Tracheostomy Style:Cuffed Tracheostomy Size: 6 Tracheostomy Secured [id number] and velcro Tracheostomy Placement Confirmation:Trach cuff visualized and in place and Chest X ray ordered for placement    Cherre Huger A [id number], [id number] PM

## 2015-05-23 NOTE — Procedures (Signed)
Percutaneous Tracheostomy Placement  Consent from family.  Patient sedated, paralyzed and position.  Placed on 100% FiO2 and RR matched.  Area cleaned and draped.  Lidocaine/epi injected.  Skin incision done followed by blunt dissection.  Trachea palpated then punctured, catheter passed and visualized bronchoscopically.  Wire placed and visualized.  Catheter removed.  Airway then crushed and dilated.  Size 6 cuffed shiley trach placed and visualized bronchoscopically well above carina.  Good volume returns.  Patient tolerated the procedure well without complications.  Minimal blood loss.  CXR ordered and pending.  Wesam G. Yacoub, M.D. [place] Pulmonary/Critical Care Medicine. Pager: [phone]. After hours pager: [phone]. 

## 2015-05-24 LAB — RENAL FUNCTION PANEL
Albumin: 2.3 g/dL — ABNORMAL LOW (ref 3.5–5.0)
Anion gap: 8 (ref 5–15)
BUN: 35 mg/dL — ABNORMAL HIGH (ref 6–20)
CO2: 31 mmol/L (ref 22–32)
Calcium: 8.8 mg/dL — ABNORMAL LOW (ref 8.9–10.3)
Chloride: 105 mmol/L (ref 101–111)
Creatinine, Ser: 0.72 mg/dL (ref 0.44–1.00)
GFR calc Af Amer: >60 mL/min (ref >60)
GFR calc non Af Amer: >60 mL/min (ref >60)
Glucose, Bld: 115 mg/dL — ABNORMAL HIGH (ref 65–99)
Phosphorus: 3.6 mg/dL (ref 2.5–4.6)
Potassium: 3.5 mmol/L (ref 3.5–5.1)
Sodium: 144 mmol/L (ref 135–145)

## 2015-05-24 LAB — GLUCOSE, CAPILLARY
Glucose-Capillary: 107 mg/dL — ABNORMAL HIGH (ref 65–99)
Glucose-Capillary: 110 mg/dL — ABNORMAL HIGH (ref 65–99)
Glucose-Capillary: 111 mg/dL — ABNORMAL HIGH (ref 65–99)
Glucose-Capillary: 124 mg/dL — ABNORMAL HIGH (ref 65–99)
Glucose-Capillary: 76 mg/dL (ref 65–99)
Glucose-Capillary: 93 mg/dL (ref 65–99)

## 2015-05-24 LAB — CBC WITH DIFFERENTIAL/PLATELET
Basophils Absolute: 0 10*3/uL (ref 0.0–0.1)
Basophils Relative: 0 %
Eosinophils Absolute: 0 10*3/uL (ref 0.0–0.7)
Eosinophils Relative: 0 %
HCT: 28.7 % — ABNORMAL LOW (ref 36.0–46.0)
Hemoglobin: 9.1 g/dL — ABNORMAL LOW (ref 12.0–15.0)
Lymphocytes Relative: 22 %
Lymphs Abs: 3 10*3/uL (ref 0.7–4.0)
MCH: 25.6 pg — ABNORMAL LOW (ref 26.0–34.0)
MCHC: 31.7 g/dL (ref 30.0–36.0)
MCV: 80.6 fL (ref 78.0–100.0)
Monocytes Absolute: 1 10*3/uL (ref 0.1–1.0)
Monocytes Relative: 8 %
Neutro Abs: 9.4 10*3/uL — ABNORMAL HIGH (ref 1.7–7.7)
Neutrophils Relative %: 70 %
Platelets: 374 10*3/uL (ref 150–400)
RBC: 3.56 MIL/uL — ABNORMAL LOW (ref 3.87–5.11)
RDW: 15.7 % — ABNORMAL HIGH (ref 11.5–15.5)
WBC: 13.4 10*3/uL — ABNORMAL HIGH (ref 4.0–10.5)

## 2015-05-24 LAB — MAGNESIUM: Magnesium: 2.7 mg/dL — ABNORMAL HIGH (ref 1.7–2.4)

## 2015-05-24 MED ORDER — CLONAZEPAM 0.5 MG PO TABS
0.5000 mg | ORAL_TABLET | Freq: Two times a day (BID) | ORAL | Status: DC
  Administered 2015-05-24 – 2015-06-08 (×30): 0.5 mg via ORAL
  Filled 2015-05-24 (×30): qty 1

## 2015-05-24 MED ORDER — SODIUM CHLORIDE 0.9 % IV BOLUS (SEPSIS)
500.0000 mL | Freq: Once | INTRAVENOUS | Status: AC
  Administered 2015-05-24: 500 mL via INTRAVENOUS

## 2015-05-24 MED ORDER — PRO-STAT SUGAR FREE PO LIQD
30.0000 mL | Freq: Three times a day (TID) | ORAL | Status: DC
  Administered 2015-05-24 – 2015-05-28 (×13): 30 mL
  Filled 2015-05-24 (×13): qty 30

## 2015-05-24 MED ORDER — CLONAZEPAM 0.1 MG/ML ORAL SUSPENSION
0.5000 mg | Freq: Two times a day (BID) | ORAL | Status: DC
  Filled 2015-05-24: qty 5

## 2015-05-24 MED ORDER — SERTRALINE HCL 20 MG/ML PO CONC
50.0000 mg | Freq: Every day | ORAL | Status: DC
  Administered 2015-05-25 – 2015-06-05 (×12): 50 mg
  Filled 2015-05-24 (×15): qty 2.5

## 2015-05-24 MED ORDER — VITAL HIGH PROTEIN PO LIQD
1000.0000 mL | ORAL | Status: AC
  Administered 2015-05-24: 20:00:00
  Administered 2015-05-24 – 2015-05-28 (×5): 1000 mL

## 2015-05-24 NOTE — Progress Notes (Signed)
Pt on trach collar 8L/35% from 12:45 to [id number] with no complications. Pt stated it made her cold and RN was going to give her fentanyl so Pt was placed back on ventilator on full support for sleep. RT will continue to monitor.

## 2015-05-24 NOTE — Progress Notes (Signed)
Assisted Physical Therapy in walking pt x1 loop around 54M on ventilator. Pt on cpap 5/ ps 5 100% during walk. Pt placed back on fs for rest post walk and will attempt TC later today. RT will continue to monitor.

## 2015-05-24 NOTE — Progress Notes (Signed)
PULMONARY / CRITICAL CARE MEDICINE   Name: Frances Medina MRN: [id number] DOB: 03-06-1960    ADMISSION DATE:  05/15/2015 CONSULTATION DATE: 05/16/2015  REFERRING MD:   CHIEF COMPLAINT: Weakness, dysphagia, SOB in setting of known MG   HISTORY OF PRESENT ILLNESS:  55 y.o. female presenting to ED on 3/14 with increased weakness, SOB, and dysphagia over the last 7 d PTA.. Of note, she was recently diagnosed with myasthenia gravis during hospitalization in January 2017, during which time she required treatment with IVIG and was started on pyridostigmine. She had another hospitalization for MG flare in February, also requiring treatment with IVIG and steroid taper, thought to have been triggered by a UTI. She was supposed to have continued prednisone upon last hospital discharge 04/13/15, but patient was not aware that she had a prescription. She called her neurologist Dr. Audelia Acton last Friday about her dysphagia, and he recommended increasing frequency of pyridostigmine 60 mg to q6h from q8h. He also noted that patient would need to start steroids or be seen in the ED. Patient had an appointment to see him 05/16/15 with plans to start prednisone, but with increasing fatigue, she decided to be evaluated in the ED. Swallowing has become more difficult since last Friday, 05/11/15. She was only able to eat "a few nuggets" today. She was, however, able to take all but her evening dose of pyridostigmine. She has been able to drink fluids but not as much as usual. She and her husband report that she has trouble with her speech if she talks too much. She also complains of chest pain under her breast that feels like "it's swollen" and cuts off her breath since yesterday. She has had rhinorrhea with post-nasal drip and cough in the middle of the night since last weak but has not taken any new OTC medications. No sick contacts.In the ED on 3/14, patient's vital signs were stable, and SpO2 was 99% on room air. She was to be  admitted to the ICU. Since presentation her swallowing fxn declined further w/ difficulty swallowing oral secretions. A NIF was recorded at -25, and her VC was 574ml. PCCM was consulted given concern for impending respiratory failure.   SUBJECTIVE: Patient underwent tracheostomy placement yesterday. Patient reports some mild pain in her neck with a head nod. Denies any nausea with a head nod. No difficulty breathing. Continuing on propofol infusion.  REVIEW OF SYSTEMS:  Unable to obtain given ventilator requirement.  VITAL SIGNS: BP 98/63 mmHg  Pulse 55  Temp(Src) 97.5 F (36.4 C) (Oral)  Resp 17  Ht 5' (1.524 m)  Wt 119.5 kg (263 lb 7.2 oz)  BMI 51.45 kg/m2  SpO2 100%  HEMODYNAMICS:    VENTILATOR SETTINGS: Vent Mode:  [-] PRVC FiO2 (%):  [40 %] 40 % Set Rate:  [18 bmp] 18 bmp Vt Set:  [370 mL] 370 mL PEEP:  [5 cmH20] 5 cmH20 Plateau Pressure:  [16 cmH20-23 cmH20] 18 cmH20  INTAKE / OUTPUT: I/O last 3 completed shifts: In: 919.9 [I.V.:859.9; NG/GT:60] Out: 700 [Urine:100; Other:600]  PHYSICAL EXAMINATION: General:  Awake. No distress.  No family at bedside. Remains on ventilator. Integument:  Warm & dry. No rash on exposed skin.  HEENT:  No scleral injection or icterus. Tracheostomy in place. Cardiovascular:  Regular rate. No edema. No appreciable JVD.  Pulmonary:  Clear bilaterally to auscultation. Symmetric chest wall rise on ventilator. Abdomen: Soft. Normal bowel sounds. Nondistended.  Neurological:  Follows commands. Moving all 4 extremities equally. Grossly nonfocal.  LABS:  BMET  Recent Labs Lab 05/22/15 0500 05/23/15 0600 05/24/15 0524  NA 141 142 144  K 3.4* 4.0 3.5  CL 100* 99* 105  CO2 32 32 31  BUN 34* 26* 35*  CREATININE 0.67 0.62 0.72  GLUCOSE 110* 154* 115*    Electrolytes  Recent Labs Lab 05/22/15 0500 05/23/15 0600 05/24/15 0524  CALCIUM 8.9 8.9 8.8*  MG  --   --  2.7*  PHOS  --   --  3.6    CBC  Recent Labs Lab  05/22/15 0500 05/23/15 0600 05/24/15 0524  WBC 11.9* 12.7* 13.4*  HGB 8.7* 9.2* 9.1*  HCT 29.1* 29.3* 28.7*  PLT 328 359 374    Coag's  Recent Labs Lab 05/23/15 0600  APTT 32  INR 1.19    Sepsis Markers  Recent Labs Lab 05/18/15 0530  PROCALCITON <0.10    ABG  Recent Labs Lab 05/19/15 1249 05/22/15 1220  PHART 7.350 7.329*  PCO2ART 57.0* 64.4*  PO2ART 96.0 197*    Liver Enzymes  Recent Labs Lab 05/24/15 0524  ALBUMIN 2.3*    Cardiac Enzymes No results for input(s): TROPONINI, PROBNP in the last 168 hours.  Glucose  Recent Labs Lab 05/23/15 0808 05/23/15 1142 05/23/15 1615 05/23/15 2000 05/24/15 0014 05/24/15 0342  GLUCAP 143* 142* 155* 132* 124* 111*    Imaging Dg Chest Port 1 View  05/23/2015  CLINICAL DATA:  Tracheostomy placement. EXAM: PORTABLE CHEST 1 VIEW COMPARISON:  Study obtained earlier in the day FINDINGS: There is now a tracheostomy present with the tracheostomy catheter tip 2.4 cm above the carina. Feeding tube tip is below the diaphragm. Central catheter tip is at the cavoatrial junction. No pneumothorax. There are bilateral pleural effusions, larger on the right than on the left. There is atelectatic change in the bases. Heart is mildly enlarged. The pulmonary vascularity appears normal. No adenopathy. No bone lesions. IMPRESSION: Tube and catheter positions as described without pneumothorax. Mild cardiomegaly with bilateral pleural effusions. Suspect a degree of congestive heart failure. There is bibasilar atelectasis. Electronically Signed   By: Lowella Grip III M.D.   On: 05/23/2015 16:17     STUDIES:  CXR 3/14 >> Hypoinflation of the lungs with mild bibasilar subsegmental atelectasis  Abd X-ray 3/21: Feeding tube with tip in distal duodenum. Port CXR 3/22: Right internal jugular central venous catheter in good position. Endotracheal tube in good position. Low lung volumes. Minimal basilar atelectasis. Port CXR 3/22:   Trach in place w/ tip approximately 2.5cm above carina. No new opacity. R IJ CVL in place. Bilateral effusions right greater than left.  MICROBIOLOGY: Urine Ctx 3/14:  E coli MRSA Nasal Ctx 3/15:  Negative   ANTIBIOTICS: Bactrim 3/18 - 3/20   SIGNIFICANT EVENTS: 2/10 >> D/C from hospital  3/13 >> Onset of severe SOB and weakness  3/14 >> Present to ED  3/15 >> IVIG and ETT  3/18 >> Unsuccessful extubation , reintubated.  3/21 >> Unsuccessful extubation, reintubated 3/22 >> Trach placed by JY  LINES/TUBES: Shiley #6 Cuffed (Placed by Hyman Bible) 3/22>> R IJ CVL 3/15 >>  L Nare NGT 3/21>>> OETT 7.0 3/15 - 3/18; 3/18 - 3/21; 3/21 - 3/22  ASSESSMENT / PLAN:  PULMONARY A: Acute Respiratory Failure due to flare of MG - Reintubated twice. Now s/p trach.  P:   Full Vent Support S/P Tracheostomy Starting PS wean  CARDIOVASCULAR A:  H/O HTN - On no home medications. Normotensive here.   P:  Monitor  on telemetry Vitals per unit protocol  RENAL A:   Hypokalemia - Resolved.  P:   Avoiding Foley Daily weights Trending renal function & electrolytes daily  GASTROINTESTINAL A:   Dysphagia  H/O GERD   P:  Continue Tube Feedings Pepcid VT bid Speech Eval as appropriate  HEMATOLOGIC A:   Anemia - No signs of active bleeding. Leukocytosis - Mild.  P:  Trending cell counts daily w/ CBC Transfuse for Hgb <7.0 SCDs Heparin [place] q8hr  INFECTIOUS A:   E coli UTI - S/P tx.  P:   Avoiding Foley Monitor for fever & leukocytosis  ENDOCRINE A:   Hyperglycemia - BG stable.  P:   Accu-Checks q4hr Low dose SSI per algorithm  NEUROLOGIC A:   Myasthenia Gravis Exacerbation - Complete 5 days IVIG (3/19). Solu-Medrol stopped 3/22.  P:   Neurology following peripherally Continue mestinon 60 q 6 RASS goal: 0 to -1 D/C Propofol gtt Fentanyl IV prn  Restart Zoloft home dose Klonopin 0.5mg  bid VT  FAMILY  - Updates: none at bedside  TODAY'S SUMMARY:  55 year old  female with myasthenia crisis. Patient attempted extubation twice with failure. Status post tracheostomy yesterday without complication. Continuing on Mestinon per neurology recommendations. Continuing treatment with physical therapy & occupational therapy. Adding back home Zoloft & Klonopin to help with anxiety as we proceed to pressure support wean. Plan for ultimate disposition to long-term acute care facility and to that end I have consulted social worker.  I have spent a total of 36 minutes of critical care time today caring for the patient & reviewing the patient's electronic medical record.  Sonia Baller Ashok Cordia, M.D. [facility] Pulmonary & Critical Care Pager:  [phone] After 3pm or if no response, call [phone] 05/24/2015, 7:59 AM

## 2015-05-24 NOTE — Progress Notes (Signed)
Nutrition Follow-up  DOCUMENTATION CODES:   Morbid obesity  INTERVENTION:   - Resume tube feeding with Vital High Protein at goal rate of 35 ml/hr.  Provide 30 ml Prostat TID.   Tube feeding regimen will provide a total of 1140 kcals, 119 grams of protein, and 702 ml of water.   NUTRITION DIAGNOSIS:   Inadequate oral intake related to inability to eat as evidenced by NPO status.  Ongoing  GOAL:   Provide needs based on ASPEN/SCCM guidelines  Currently unmet (TF off)  MONITOR:   TF tolerance, Labs, Vent status, Weight trends  REASON FOR ASSESSMENT:   Consult Enteral/tube feeding initiation and management  ASSESSMENT:   Pt presenting with myasthenia gravis exacerbation. PMH is significant for HTN,uterine fibroids, depression, anxiety, migraines and recently diagnosed myasthenia gravis, and undergoing work-up of splenomegaly with lymphadenopathy.  Tube feeding stopped on 3/21 at 1000. S/p trach procedure on 3/22  Patient currently remains intubated on ventilator support. Propofol: off Cortrak tube, tip in distal duodenum  Patient dicussed during ICU rounds and with RN.  RN states that patient has failed extubated twice.  Plan to restart tube feedings today.  Medications reviewed: novolog, MVI.  Labs reviewed: CBGs (93-132), elevated magnesium (2.7).   Diet Order:  Diet NPO time specified  Skin:  Reviewed, no issues  Last BM:  3/22  Height:   Ht Readings from Last 1 Encounters:  05/16/15 5' (1.524 m)    Weight:   Wt Readings from Last 1 Encounters:  05/24/15 263 lb 7.2 oz (119.5 kg)    Ideal Body Weight:  45.4 kg  BMI:  Body mass index is 51.45 kg/(m^2).  Estimated Nutritional Needs:   Kcal:  [id number]  Protein:  114 grams  Fluid:  >/= 1.5 L  EDUCATION NEEDS:   No education needs identified at this time  Veronda Prude, Dietetic Intern Pager: [phone]

## 2015-05-24 NOTE — Progress Notes (Signed)
Physical Therapy Treatment Patient Details Name: Frances Medina MRN: [id number] DOB: 06-26-1960 Today's Date: 05/24/2015    History of Present Illness pt presents with c/o difficulty swallowing for several days and SOB.  pt found to be in Myasthenia Gravis Flare up with recent diagnosis of Myasthenia Gravis in January and a flare up in February.  pt intubated on 3/15, extubated 3/18 with re-intubation on 3/18 and on vent for Eval. Attempted extubation 3/21 unsuccessfully and pt reintubated.  Trach placed on 3/22. Pt with hx of HTN, Depression, Anxiety, and Migraines.  pt currently undergoing work-up for Splenomegaly with Lymphadenopathy.    PT Comments    Frances Medina made excellent progress today, ambulating 150 ft using RW and RT for assistance w/ vent management.  HR ranging 101-117 while ambulating and no rest breaks needed.  Minimal secretions today.  Pt will benefit from continued skilled PT services to increase functional independence and safety.   Follow Up Recommendations  Home health PT;Supervision - Intermittent     Equipment Recommendations  None recommended by PT    Recommendations for Other Services       Precautions / Restrictions Precautions Precautions: Fall Precaution Comments: Vent Restrictions Weight Bearing Restrictions: No    Mobility  Bed Mobility Overal bed mobility: Needs Assistance Bed Mobility: Supine to Sit     Supine to sit: Min assist;HOB elevated     General bed mobility comments: Pt reaches for therapist hand to pull up to sitting and for scooting EOB.    Transfers Overall transfer level: Needs assistance Equipment used: Rolling walker (2 wheeled) Transfers: Sit to/from Stand Sit to Stand: Min assist;+2 safety/equipment Stand pivot transfers: Min assist;+2 safety/equipment       General transfer comment: Cues for hand placement to push from the bed when standing from bed.  +2 assist to manage lines and min assist to  steady.  Ambulation/Gait Ambulation/Gait assistance: Min assist;+2 safety/equipment (+3 including RT for Vent management) Ambulation Distance (Feet): 150 Feet Assistive device: Rolling walker (2 wheeled) Gait Pattern/deviations: Step-through pattern;Decreased stride length   Gait velocity interpretation: Below normal speed for age/gender General Gait Details: Dec gait speed and pt utilizes RW for UE support.  HR 101-117 while ambulating w/ minimal secretions.  RT assisting w/ vent management and chair follow for safety.  Pt does not require any rest breaks.   Stairs            Wheelchair Mobility    Modified Rankin (Stroke Patients Only)       Balance Overall balance assessment: Needs assistance Sitting-balance support: No upper extremity supported;Feet supported Sitting balance-Leahy Scale: Fair Sitting balance - Comments: Pt w/ slight posterior lean and increased effort sitting EOB until feet are supported on floor. Postural control: Posterior lean Standing balance support: Bilateral upper extremity supported;During functional activity Standing balance-Leahy Scale: Fair Standing balance comment: RW for assist and support                    Cognition Arousal/Alertness: Awake/alert Behavior During Therapy: WFL for tasks assessed/performed Overall Cognitive Status: Difficult to assess                      Exercises      General Comments General comments (skin integrity, edema, etc.): Pt received w/ BP 135/86 supine in bed      Pertinent Vitals/Pain Pain Assessment: No/denies pain    Home Living  Prior Function            PT Goals (current goals can now be found in the care plan section) Acute Rehab PT Goals Patient Stated Goal: Hopeful for trach collar today PT Goal Formulation: With patient Time For Goal Achievement: 06/04/15 Potential to Achieve Goals: Good Progress towards PT goals: Progressing toward  goals    Frequency  Min 3X/week    PT Plan Current plan remains appropriate    Co-evaluation             End of Session Equipment Utilized During Treatment: Gait belt;Other (comment) (vent) Activity Tolerance: Patient tolerated treatment well;Patient limited by fatigue Patient left: in chair;with call bell/phone within reach;Other (comment) (w/ nurse tech in room)     Time: [id number] PT Time Calculation (min) (ACUTE ONLY): 20 min  Charges:  $Gait Training: 8-22 mins                    G Codes:      Collie Siad PT, DPT  Pager: [phone] Phone: [phone] 05/24/2015, 11:29 AM

## 2015-05-25 LAB — GLUCOSE, CAPILLARY
Glucose-Capillary: 108 mg/dL — ABNORMAL HIGH (ref 65–99)
Glucose-Capillary: 114 mg/dL — ABNORMAL HIGH (ref 65–99)
Glucose-Capillary: 116 mg/dL — ABNORMAL HIGH (ref 65–99)
Glucose-Capillary: 117 mg/dL — ABNORMAL HIGH (ref 65–99)
Glucose-Capillary: 117 mg/dL — ABNORMAL HIGH (ref 65–99)
Glucose-Capillary: 119 mg/dL — ABNORMAL HIGH (ref 65–99)
Glucose-Capillary: 137 mg/dL — ABNORMAL HIGH (ref 65–99)

## 2015-05-25 LAB — CBC WITH DIFFERENTIAL/PLATELET
Basophils Absolute: 0 10*3/uL (ref 0.0–0.1)
Basophils Relative: 0 %
Eosinophils Absolute: 0 10*3/uL (ref 0.0–0.7)
Eosinophils Relative: 0 %
HCT: 29.3 % — ABNORMAL LOW (ref 36.0–46.0)
Hemoglobin: 9 g/dL — ABNORMAL LOW (ref 12.0–15.0)
Lymphocytes Relative: 24 %
Lymphs Abs: 2.7 10*3/uL (ref 0.7–4.0)
MCH: 24.9 pg — ABNORMAL LOW (ref 26.0–34.0)
MCHC: 30.7 g/dL (ref 30.0–36.0)
MCV: 80.9 fL (ref 78.0–100.0)
Monocytes Absolute: 0.6 10*3/uL (ref 0.1–1.0)
Monocytes Relative: 5 %
Neutro Abs: 8 10*3/uL — ABNORMAL HIGH (ref 1.7–7.7)
Neutrophils Relative %: 71 %
Platelets: 362 10*3/uL (ref 150–400)
RBC: 3.62 MIL/uL — ABNORMAL LOW (ref 3.87–5.11)
RDW: 16.3 % — ABNORMAL HIGH (ref 11.5–15.5)
WBC: 11.2 10*3/uL — ABNORMAL HIGH (ref 4.0–10.5)

## 2015-05-25 LAB — RENAL FUNCTION PANEL
Albumin: 2.4 g/dL — ABNORMAL LOW (ref 3.5–5.0)
Anion gap: 6 (ref 5–15)
BUN: 36 mg/dL — ABNORMAL HIGH (ref 6–20)
CO2: 30 mmol/L (ref 22–32)
Calcium: 8.5 mg/dL — ABNORMAL LOW (ref 8.9–10.3)
Chloride: 109 mmol/L (ref 101–111)
Creatinine, Ser: 0.61 mg/dL (ref 0.44–1.00)
GFR calc Af Amer: >60 mL/min (ref >60)
GFR calc non Af Amer: >60 mL/min (ref >60)
Glucose, Bld: 124 mg/dL — ABNORMAL HIGH (ref 65–99)
Phosphorus: 3.5 mg/dL (ref 2.5–4.6)
Potassium: 3.2 mmol/L — ABNORMAL LOW (ref 3.5–5.1)
Sodium: 145 mmol/L (ref 135–145)

## 2015-05-25 LAB — MAGNESIUM: Magnesium: 2.4 mg/dL (ref 1.7–2.4)

## 2015-05-25 LAB — TRIGLYCERIDES: Triglycerides: 150 mg/dL — ABNORMAL HIGH (ref <150)

## 2015-05-25 MED ORDER — POTASSIUM CHLORIDE 20 MEQ/15ML (10%) PO SOLN
40.0000 meq | Freq: Once | ORAL | Status: AC
  Administered 2015-05-25: 40 meq
  Filled 2015-05-25: qty 30

## 2015-05-25 NOTE — Clinical Social Work Note (Signed)
Clinical Education officer, museum received referral for LTAC placement.  CSW notified CM of MD request.  RNCM to further explore LTAC options with patient and family.  Clinical Social Worker will sign off for now as social work intervention is no longer needed. Please consult [place] again if new need arises.  Barbette Or, [place]

## 2015-05-25 NOTE — Care Management Note (Signed)
Case Management Note  Patient Details  Name: Frances Medina MRN: [id number] Date of Birth: December 15, 1960  Subjective/Objective:   Pt s/p tracheostomy on 05/23/15; continues to need full vent support due to fatigue.            Action/Plan: LTAC consult per MD.  Met with pt to discuss possible LTAC.  Pt fully alert, giving thumbs up, nodding head yes/no when asked questions.  She acknowledges that she is agreeable to [facility], and that she prefers Engineer, maintenance.  She adamantly refuses [facility].  She is agreeable to having [facility] admissions liaison [name] call her husband [name] to discuss possible admission.  We will submit for insurance authorization on Monday.  Encouraged pt, and provided emotional support.  Will follow with updates.    Expected Discharge Date:                  Expected Discharge Plan:  Long Term Acute Care (LTAC)  In-House Referral:     Discharge planning Services  CM Consult  Post Acute Care Choice:    Choice offered to:     DME Arranged:    DME Agency:     HH Arranged:    HH Agency:     Status of Service:  In process, will continue to follow  Medicare Important Message Given:    Date Medicare IM Given:    Medicare IM give by:    Date Additional Medicare IM Given:    Additional Medicare Important Message give by:     If discussed at [place] of Stay Meetings, dates discussed:    Additional Comments:  Reinaldo Raddle, RN, BSN  Trauma/Neuro ICU Case Manager [phone]

## 2015-05-25 NOTE — Progress Notes (Signed)
Speech Pathology:  Please order PMV evaluation when deemed appropriate.  Thank you, [place] L. Tivis Ringer, [place] CCC/SLP Pager [phone]

## 2015-05-25 NOTE — Progress Notes (Signed)
Physical Therapy Treatment Patient Details Name: Frances Medina MRN: [id number] DOB: 10-30-1960 Today's Date: 05/25/2015    History of Present Illness pt presents with c/o difficulty swallowing for several days and SOB.  pt found to be in Myasthenia Gravis Flare up with recent diagnosis of Myasthenia Gravis in January and a flare up in February.  pt intubated on 3/15, extubated 3/18 with re-intubation on 3/18 and on vent for Eval. Attempted extubation 3/21 unsuccessfully and pt reintubated.  Trach placed on 3/22. Pt with hx of HTN, Depression, Anxiety, and Migraines.  pt currently undergoing work-up for Splenomegaly with Lymphadenopathy.    PT Comments    Pt on trach collar today 3L/28% on tank during all mobility.  Pt O2 sats remained mid 90s and all other VSS.  Pt continues to improve overall mobility and was able to demonstrate following cues to pace herself today since this was her first time mobilizing on trach collar.  Continue to feel pt will continue to make great progress with her mobility and have little needs for PT by D/C to home.    Follow Up Recommendations  Home health PT;Supervision - Intermittent     Equipment Recommendations  None recommended by PT    Recommendations for Other Services       Precautions / Restrictions Precautions Precautions: Fall Precaution Comments: Trach Collar Restrictions Weight Bearing Restrictions: No    Mobility  Bed Mobility Overal bed mobility: Needs Assistance Bed Mobility: Supine to Sit     Supine to sit: Min assist;HOB elevated     General bed mobility comments: pt continues to pull on PT's hand for bringing trunk up to sitting.    Transfers Overall transfer level: Needs assistance Equipment used: Rolling walker (2 wheeled) Transfers: Sit to/from Stand Sit to Stand: Min guard;+2 safety/equipment         General transfer comment: continues to need cueing for UE use, but only guarding A today.     Ambulation/Gait Ambulation/Gait assistance: Min guard;+2 safety/equipment Ambulation Distance (Feet): 150 Feet Assistive device: Rolling walker (2 wheeled) Gait Pattern/deviations: Step-through pattern;Decreased stride length     General Gait Details: pt moving more slowly and pacing self today.  pt mobilizing for the first time on Trach collar and tolerating well.  O2 sats mid 90s on 3L/28% and all other VSS.     Stairs            Wheelchair Mobility    Modified Rankin (Stroke Patients Only)       Balance Overall balance assessment: Needs assistance Sitting-balance support: No upper extremity supported;Feet supported Sitting balance-Leahy Scale: Fair     Standing balance support: Single extremity supported;Bilateral upper extremity supported;During functional activity Standing balance-Leahy Scale: Fair Standing balance comment: pt able to stand and perform her own peri hygiene with only single UE support and MinG.                      Cognition Arousal/Alertness: Awake/alert Behavior During Therapy: WFL for tasks assessed/performed Overall Cognitive Status: Difficult to assess                      Exercises      General Comments        Pertinent Vitals/Pain Pain Assessment: No/denies pain    Home Living                      Prior Function  PT Goals (current goals can now be found in the care plan section) Acute Rehab PT Goals Patient Stated Goal: Back to normal. PT Goal Formulation: With patient Time For Goal Achievement: 06/04/15 Potential to Achieve Goals: Good Progress towards PT goals: Progressing toward goals    Frequency  Min 3X/week    PT Plan Current plan remains appropriate    Co-evaluation             End of Session Equipment Utilized During Treatment: Gait belt;Oxygen Activity Tolerance: Patient tolerated treatment well Patient left: in chair;with call bell/phone within reach      Time: 0833-0904 PT Time Calculation (min) (ACUTE ONLY): 31 min  Charges:  $Gait Training: 23-37 mins                    G CodesCatarina Hartshorn, [place]  05/25/2015, 10:17 AM

## 2015-05-25 NOTE — Progress Notes (Signed)
Occupational Therapy Treatment Patient Details Name: Frances Medina MRN: [id number] DOB: Jun 09, 1960 Today's Date: 05/25/2015    History of present illness pt presents with c/o difficulty swallowing for several days and SOB.  pt found to be in Myasthenia Gravis Flare up with recent diagnosis of Myasthenia Gravis in January and a flare up in February.  pt intubated on 3/15, extubated 3/18 with re-intubation on 3/18 and on vent for Eval. Attempted extubation 3/21 unsuccessfully and pt reintubated.  Trach placed on 3/22. Pt with hx of HTN, Depression, Anxiety, and Migraines.  pt currently undergoing work-up for Splenomegaly with Lymphadenopathy.   OT comments  Pt provided with theraband and HEP for bil. UE strengthening.    Follow Up Recommendations  No OT follow up;Supervision/Assistance - 24 hour    Equipment Recommendations  None recommended by OT    Recommendations for Other Services      Precautions / Restrictions Precautions Precautions: Fall Precaution Comments: Deno Lunger       Mobility Bed Mobility                  Transfers                      Balance                                   ADL                                                Vision                     Perception     Praxis      Cognition   Behavior During Therapy: WFL for tasks assessed/performed Overall Cognitive Status: Difficult to assess                       Extremity/Trunk Assessment               Exercises General Exercises - Upper Extremity Shoulder ABduction: Strengthening;20 reps;Seated;Theraband;Right;Left Theraband Level (Shoulder Abduction): Level 4 (Blue) Shoulder ADduction: Strengthening;20 reps;Theraband;Right;Left Theraband Level (Shoulder Adduction): Level 4 (Blue) Shoulder Horizontal ABduction: Strengthening;Right;Left;20 reps;Theraband Theraband Level (Shoulder Horizontal Abduction): Level 4  (Blue) Other Exercises Other Exercises: Pt performed 2 sets of 10 reps diagonals with blue theraband    Shoulder Instructions       General Comments      Pertinent Vitals/ Pain       Pain Assessment: No/denies pain  Home Living                                          Prior Functioning/Environment              Frequency Min 2X/week     Progress Toward Goals  OT Goals(current goals can now be found in the care plan section)  Progress towards OT goals: Progressing toward goals  ADL Goals Pt Will Perform Grooming: with modified independence;standing Pt Will Perform Upper Body Bathing: with modified independence;sitting Pt Will Perform Lower Body Bathing: with modified independence;sit to/from stand Pt Will Perform Upper Body Dressing: with modified independence;sitting Pt  Will Perform Lower Body Dressing: with modified independence;sit to/from stand Pt Will Transfer to Toilet: with modified independence;ambulating;regular height toilet;bedside commode;grab bars Pt Will Perform Toileting - Clothing Manipulation and hygiene: with modified independence;sit to/from stand Pt Will Perform Tub/Shower Transfer: Tub transfer;with supervision;ambulating;shower seat;grab bars  Plan Discharge plan remains appropriate    Co-evaluation                 End of Session Equipment Utilized During Treatment: Oxygen   Activity Tolerance Patient tolerated treatment well   Patient Left in bed;with call bell/phone within reach   Nurse Communication          Time: [id number] OT Time Calculation (min): 12 min  Charges: OT General Charges $OT Visit: 1 Procedure OT Treatments $Therapeutic Exercise: 8-22 mins  Philip Eckersley M 05/25/2015, 7:34 PM

## 2015-05-25 NOTE — Progress Notes (Signed)
PULMONARY / CRITICAL CARE MEDICINE   Name: Frances Medina MRN: [id number] DOB: 1960-08-16    ADMISSION DATE:  05/15/2015 CONSULTATION DATE: 05/16/2015  REFERRING MD:   CHIEF COMPLAINT: Weakness, dysphagia, SOB in setting of known MG   HISTORY OF PRESENT ILLNESS:  55 y.o. female presenting to ED on 3/14 with increased weakness, SOB, and dysphagia over the last 7 d PTA.. Of note, she was recently diagnosed with myasthenia gravis during hospitalization in January 2017, during which time she required treatment with IVIG and was started on pyridostigmine. She had another hospitalization for MG flare in February, also requiring treatment with IVIG and steroid taper, thought to have been triggered by a UTI. She was supposed to have continued prednisone upon last hospital discharge 04/13/15, but patient was not aware that she had a prescription. She called her neurologist Dr. Audelia Acton last Friday about her dysphagia, and he recommended increasing frequency of pyridostigmine 60 mg to q6h from q8h. He also noted that patient would need to start steroids or be seen in the ED. Patient had an appointment to see him 05/16/15 with plans to start prednisone, but with increasing fatigue, she decided to be evaluated in the ED. Swallowing has become more difficult since last Friday, 05/11/15. She was only able to eat "a few nuggets" today. She was, however, able to take all but her evening dose of pyridostigmine. She has been able to drink fluids but not as much as usual. She and her husband report that she has trouble with her speech if she talks too much. She also complains of chest pain under her breast that feels like "it's swollen" and cuts off her breath since yesterday. She has had rhinorrhea with post-nasal drip and cough in the middle of the night since last weak but has not taken any new OTC medications. No sick contacts.In the ED on 3/14, patient's vital signs were stable, and SpO2 was 99% on room air. She was to be  admitted to the ICU. Since presentation her swallowing fxn declined further w/ difficulty swallowing oral secretions. A NIF was recorded at -25, and her VC was 525ml. PCCM was consulted given concern for impending respiratory failure.   SUBJECTIVE: denies pain Breathing ok    VITAL SIGNS: BP 105/78 mmHg  Pulse 67  Temp(Src) 98.4 F (36.9 C) (Oral)  Resp 18  Ht 5' (1.524 m)  Wt 263 lb 0.1 oz (119.3 kg)  BMI 51.37 kg/m2  SpO2 100%  HEMODYNAMICS:    VENTILATOR SETTINGS: Vent Mode:  [-] PRVC FiO2 (%):  [28 %-30 %] 28 % Set Rate:  [18 bmp] 18 bmp Vt Set:  [370 mL] 370 mL PEEP:  [5 cmH20] 5 cmH20 Plateau Pressure:  [10 cmH20-18 cmH20] 18 cmH20  INTAKE / OUTPUT: I/O last 3 completed shifts: In: [id number] [I.V.:265.4; NG/GT:585.7] Out: 600 [Urine:600]  PHYSICAL EXAMINATION: General:  Awake. No distress.   on ventilator. Integument:  Warm & dry. No rash on exposed skin.  HEENT:  No scleral injection or icterus. Tracheostomy in place. Cardiovascular:  Regular rate. No edema. No appreciable JVD.  Pulmonary:  Clear bilaterally to auscultation. Symmetric chest wall rise on ventilator. Abdomen: Soft. Normal bowel sounds. Nondistended.  Neurological:  Follows commands. Moving all 4 extremities equally. Grossly nonfocal.  LABS:  BMET  Recent Labs Lab 05/23/15 0600 05/24/15 0524 05/25/15 0605  NA 142 144 145  K 4.0 3.5 3.2*  CL 99* 105 109  CO2 32 31 30  BUN 26* 35* 36*  CREATININE 0.62 0.72 0.61  GLUCOSE 154* 115* 124*    Electrolytes  Recent Labs Lab 05/23/15 0600 05/24/15 0524 05/25/15 0605  CALCIUM 8.9 8.8* 8.5*  MG  --  2.7* 2.4  PHOS  --  3.6 3.5    CBC  Recent Labs Lab 05/23/15 0600 05/24/15 0524 05/25/15 0605  WBC 12.7* 13.4* 11.2*  HGB 9.2* 9.1* 9.0*  HCT 29.3* 28.7* 29.3*  PLT 359 374 362    Coag's  Recent Labs Lab 05/23/15 0600  APTT 32  INR 1.19    Sepsis Markers No results for input(s): LATICACIDVEN, PROCALCITON, O2SATVEN in the  last 168 hours.  ABG  Recent Labs Lab 05/19/15 1249 05/22/15 1220  PHART 7.350 7.329*  PCO2ART 57.0* 64.4*  PO2ART 96.0 197*    Liver Enzymes  Recent Labs Lab 05/24/15 0524 05/25/15 0605  ALBUMIN 2.3* 2.4*    Cardiac Enzymes No results for input(s): TROPONINI, PROBNP in the last 168 hours.  Glucose  Recent Labs Lab 05/24/15 1202 05/24/15 1550 05/24/15 2013 05/24/15 2343 05/25/15 0358 05/25/15 0811  GLUCAP 76 107* 110* 108* 116* 114*    Imaging No results found.   STUDIES:  CXR 3/14 >> Hypoinflation of the lungs with mild bibasilar subsegmental atelectasis  Abd X-ray 3/21: Feeding tube with tip in distal duodenum. Port CXR 3/22: Right internal jugular central venous catheter in good position. Endotracheal tube in good position. Low lung volumes. Minimal basilar atelectasis. Port CXR 3/22:  Trach in place w/ tip approximately 2.5cm above carina. No new opacity. R IJ CVL in place. Bilateral effusions right greater than left.  MICROBIOLOGY: Urine Ctx 3/14:  E coli MRSA Nasal Ctx 3/15:  Negative   ANTIBIOTICS: Bactrim 3/18 - 3/20   SIGNIFICANT EVENTS: 2/10 >> D/C from hospital  3/13 >> Onset of severe SOB and weakness  3/14 >> Present to ED  3/15 >> IVIG and ETT  3/18 >> Unsuccessful extubation , reintubated.  3/21 >> Unsuccessful extubation, reintubated 3/22 >> Trach placed by JY  LINES/TUBES: Shiley #6 Cuffed (Placed by Hyman Bible) 3/22>> R IJ CVL 3/15 >>  L Nare NGT 3/21>>> OETT 7.0 3/15 - 3/18; 3/18 - 3/21; 3/21 - 3/22  ASSESSMENT / PLAN:  PULMONARY A: Acute Respiratory Failure due to flare of MG - Reintubated twice for stridor/ cord edema. Now s/p trach.  P:   ATC with goal 24h S/P Tracheostomy   CARDIOVASCULAR A:  H/O HTN - On no home medications. Normotensive here.   P:  Monitor on telemetry Vitals per unit protocol  RENAL A:   Hypokalemia   P:   Avoiding Foley Daily weights Trending renal function & electrolytes  daily  GASTROINTESTINAL A:   Dysphagia  H/O GERD   P:  Continue Tube Feedings Pepcid VT bid Speech Eval as appropriate  HEMATOLOGIC A:   Anemia - No signs of active bleeding. Leukocytosis - Mild.  P:  Trending cell counts daily w/ CBC Transfuse for Hgb <7.0 SCDs Heparin [place] q8hr  INFECTIOUS A:   E coli UTI - S/P tx.  P:   Avoiding Foley Monitor for fever & leukocytosis  ENDOCRINE A:   Hyperglycemia - BG stable.  P:   Accu-Checks q4hr Low dose SSI per algorithm  NEUROLOGIC A:   Myasthenia Gravis Exacerbation - Complete 5 days IVIG (3/19). Solu-Medrol stopped 3/22.  P:   Neurology following peripherally Continue mestinon 60 q 6 Fentanyl IV prn  Restart Zoloft home dose Klonopin 0.5mg  bid VT -home meds Ambulating with PT  FAMILY  - Updates: none at bedside  TODAY'S SUMMARY:  Myasthenic crisis appears to be resolved. She required tracheostomy for persistent stridor and vocal cord edema. Hopefully she can be liberated from the ventilator. She can transfer to stepdown unit-once she tolerates trach collar for longer duration. May be a good LTAC candidate-care management investigating  The patient is critically ill with multiple organ systems failure and requires high complexity decision making for assessment and support, frequent evaluation and titration of therapies, application of advanced monitoring technologies and extensive interpretation of multiple databases. Critical Care Time devoted to patient care services described in this note independent of APP time is 31 minutes.    Kara Mead MD. [facility] Flood. [place] [facility] Pager [phone] If no response call [phone]   05/25/2015     05/25/2015, 3:39 PM

## 2015-05-26 ENCOUNTER — Inpatient Hospital Stay (HOSPITAL_COMMUNITY): Payer: BC Managed Care – PPO

## 2015-05-26 LAB — RENAL FUNCTION PANEL
Albumin: 2.6 g/dL — ABNORMAL LOW (ref 3.5–5.0)
Anion gap: 7 (ref 5–15)
BUN: 27 mg/dL — ABNORMAL HIGH (ref 6–20)
CO2: 29 mmol/L (ref 22–32)
Calcium: 8.6 mg/dL — ABNORMAL LOW (ref 8.9–10.3)
Chloride: 110 mmol/L (ref 101–111)
Creatinine, Ser: 0.51 mg/dL (ref 0.44–1.00)
GFR calc Af Amer: >60 mL/min (ref >60)
GFR calc non Af Amer: >60 mL/min (ref >60)
Glucose, Bld: 104 mg/dL — ABNORMAL HIGH (ref 65–99)
Phosphorus: 3.1 mg/dL (ref 2.5–4.6)
Potassium: 3.4 mmol/L — ABNORMAL LOW (ref 3.5–5.1)
Sodium: 146 mmol/L — ABNORMAL HIGH (ref 135–145)

## 2015-05-26 LAB — CBC WITH DIFFERENTIAL/PLATELET
Basophils Absolute: 0 10*3/uL (ref 0.0–0.1)
Basophils Relative: 0 %
Eosinophils Absolute: 0 10*3/uL (ref 0.0–0.7)
Eosinophils Relative: 0 %
HCT: 31.2 % — ABNORMAL LOW (ref 36.0–46.0)
Hemoglobin: 9.6 g/dL — ABNORMAL LOW (ref 12.0–15.0)
Lymphocytes Relative: 16 %
Lymphs Abs: 2 10*3/uL (ref 0.7–4.0)
MCH: 25 pg — ABNORMAL LOW (ref 26.0–34.0)
MCHC: 30.8 g/dL (ref 30.0–36.0)
MCV: 81.3 fL (ref 78.0–100.0)
Monocytes Absolute: 0.4 10*3/uL (ref 0.1–1.0)
Monocytes Relative: 3 %
Neutro Abs: 10.5 10*3/uL — ABNORMAL HIGH (ref 1.7–7.7)
Neutrophils Relative %: 81 %
Platelets: 349 10*3/uL (ref 150–400)
RBC: 3.84 MIL/uL — ABNORMAL LOW (ref 3.87–5.11)
RDW: 16.6 % — ABNORMAL HIGH (ref 11.5–15.5)
WBC: 12.9 10*3/uL — ABNORMAL HIGH (ref 4.0–10.5)

## 2015-05-26 LAB — GLUCOSE, CAPILLARY
Glucose-Capillary: 116 mg/dL — ABNORMAL HIGH (ref 65–99)
Glucose-Capillary: 113 mg/dL — ABNORMAL HIGH (ref 65–99)
Glucose-Capillary: 124 mg/dL — ABNORMAL HIGH (ref 65–99)

## 2015-05-26 LAB — MAGNESIUM: Magnesium: 2.5 mg/dL — ABNORMAL HIGH (ref 1.7–2.4)

## 2015-05-26 NOTE — Progress Notes (Signed)
PULMONARY / CRITICAL CARE MEDICINE   Name: Frances Medina MRN: [id number] DOB: September 26, 1960    ADMISSION DATE:  05/15/2015 CONSULTATION DATE: 05/16/2015  REFERRING MD:   CHIEF COMPLAINT: Weakness, dysphagia, SOB in setting of known MG   HISTORY OF PRESENT ILLNESS:  55 y.o. female presenting to ED on 3/14 with increased weakness, SOB, and dysphagia over the last 7 d PTA.. Of note, she was recently diagnosed with myasthenia gravis during hospitalization in January 2017, during which time she required treatment with IVIG and was started on pyridostigmine. She had another hospitalization for MG flare in February, also requiring treatment with IVIG and steroid taper, thought to have been triggered by a UTI. She was supposed to have continued prednisone upon last hospital discharge 04/13/15, but patient was not aware that she had a prescription. She called her neurologist Dr. Audelia Acton last Friday about her dysphagia, and he recommended increasing frequency of pyridostigmine 60 mg to q6h from q8h. He also noted that patient would need to start steroids or be seen in the ED. Patient had an appointment to see him 05/16/15 with plans to start prednisone, but with increasing fatigue, she decided to be evaluated in the ED. Swallowing has become more difficult since last Friday, 05/11/15. She was only able to eat "a few nuggets" today. She was, however, able to take all but her evening dose of pyridostigmine. She has been able to drink fluids but not as much as usual. She and her husband report that she has trouble with her speech if she talks too much. She also complains of chest pain under her breast that feels like "it's swollen" and cuts off her breath since yesterday. She has had rhinorrhea with post-nasal drip and cough in the middle of the night since last weak but has not taken any new OTC medications. No sick contacts.In the ED on 3/14, patient's vital signs were stable, and SpO2 was 99% on room air. She was to be  admitted to the ICU. Since presentation her swallowing fxn declined further w/ difficulty swallowing oral secretions. A NIF was recorded at -25, and her VC was 558ml. PCCM was consulted given concern for impending respiratory failure.   SUBJECTIVE: No acute events overnight. Patient nods no to any chest pain or pressure. She nods no to any difficulty breathing. Patient was on tracheostomy collar for 5 hours yesterday but then required ventilator support which continued overnight.  REVIEW OF SYSTEMS: Patient denies any nausea or vomiting as well as headache with head nod.  VITAL SIGNS: BP 108/60 mmHg  Pulse 64  Temp(Src) 98.3 F (36.8 C) (Oral)  Resp 16  Ht 5' (1.524 m)  Wt 118.5 kg (261 lb 3.9 oz)  BMI 51.02 kg/m2  SpO2 100%  HEMODYNAMICS:    VENTILATOR SETTINGS: Vent Mode:  [-] PRVC FiO2 (%):  [28 %-30 %] 30 % Set Rate:  [18 bmp] 18 bmp Vt Set:  [370 mL] 370 mL PEEP:  [5 cmH20] 5 cmH20 Plateau Pressure:  [17 cmH20-19 cmH20] 18 cmH20  INTAKE / OUTPUT: I/O last 3 completed shifts: In: 1320 [NG/GT:1320] Out: 775 [Urine:775]  PHYSICAL EXAMINATION: General:  Awake. No distress. On tracheostomy collar. Integument:  Warm & dry. No rash on exposed skin.  HEENT:  No scleral injection or icterus. Tracheostomy in place. Cardiovascular:  Regular rate. No edema. No appreciable JVD.  Pulmonary:  Clear bilaterally to auscultation. Normal work of breathing on tracheostomy collar. Abdomen: Soft. Normal bowel sounds. Nondistended.  Neurological:  Follows commands.  Moving all 4 extremities equally. Grossly nonfocal.  LABS:  BMET  Recent Labs Lab 05/24/15 0524 05/25/15 0605 05/26/15 0500  NA 144 145 146*  K 3.5 3.2* 3.4*  CL 105 109 110  CO2 31 30 29   BUN 35* 36* 27*  CREATININE 0.72 0.61 0.51  GLUCOSE 115* 124* 104*    Electrolytes  Recent Labs Lab 05/24/15 0524 05/25/15 0605 05/26/15 0500  CALCIUM 8.8* 8.5* 8.6*  MG 2.7* 2.4 2.5*  PHOS 3.6 3.5 3.1     CBC  Recent Labs Lab 05/24/15 0524 05/25/15 0605 05/26/15 0500  WBC 13.4* 11.2* 12.9*  HGB 9.1* 9.0* 9.6*  HCT 28.7* 29.3* 31.2*  PLT 374 362 349    Coag's  Recent Labs Lab 05/23/15 0600  APTT 32  INR 1.19    Sepsis Markers No results for input(s): LATICACIDVEN, PROCALCITON, O2SATVEN in the last 168 hours.  ABG  Recent Labs Lab 05/19/15 1249 05/22/15 1220  PHART 7.350 7.329*  PCO2ART 57.0* 64.4*  PO2ART 96.0 197*    Liver Enzymes  Recent Labs Lab 05/24/15 0524 05/25/15 0605 05/26/15 0500  ALBUMIN 2.3* 2.4* 2.6*    Cardiac Enzymes No results for input(s): TROPONINI, PROBNP in the last 168 hours.  Glucose  Recent Labs Lab 05/25/15 0811 05/25/15 1328 05/25/15 1524 05/25/15 1937 05/25/15 2318 05/26/15 0323  GLUCAP 114* 117* 117* 119* 137* 116*    Imaging No results found.   STUDIES:  CXR 3/14 >> Hypoinflation of the lungs with mild bibasilar subsegmental atelectasis  Abd X-ray 3/21: Feeding tube with tip in distal duodenum. Port CXR 3/22: Right internal jugular central venous catheter in good position. Endotracheal tube in good position. Low lung volumes. Minimal basilar atelectasis. Port CXR 3/22:  Trach in place w/ tip approximately 2.5cm above carina. No new opacity. R IJ CVL in place. Bilateral effusions right greater than left.  MICROBIOLOGY: Urine Ctx 3/14:  E coli MRSA Nasal Ctx 3/15:  Negative   ANTIBIOTICS: Bactrim 3/18 - 3/20   SIGNIFICANT EVENTS: 2/10 >> D/C from hospital  3/13 >> Onset of severe SOB and weakness  3/14 >> Present to ED  3/15 >> IVIG and ETT  3/18 >> Unsuccessful extubation , reintubated.  3/21 >> Unsuccessful extubation, reintubated 3/22 >> Trach placed by JY  LINES/TUBES: Shiley #6 Cuffed (Placed by Hyman Bible) 3/22>> R IJ CVL 3/15 >>  L Nare NGT 3/21>>> OETT 7.0 3/15 - 3/18; 3/18 - 3/21; 3/21 - 3/22  ASSESSMENT / PLAN:  PULMONARY A: Acute Respiratory Failure due to flare of MG - Reintubated twice  for stridor/ cord edema. Now s/p trach.  P:   Continue intermittent tracheostomy collar trials Rest patient on full ventilator support S/P Tracheostomy  CARDIOVASCULAR A:  H/O HTN - On no home medications. Normotensive here.   P:  Monitor on telemetry Vitals per unit protocol  RENAL A:   Hypokalemia - Mild. Replacing. Hypernatremia - Mild.  P:   Avoiding Foley Daily weights Trending renal function & electrolytes daily  GASTROINTESTINAL A:   Dysphagia  H/O GERD   P:  Continue Tube Feedings Pepcid VT bid Speech Eval as appropriate  HEMATOLOGIC A:   Anemia - Mild & Hgb stable. No signs of active bleeding. Leukocytosis - Mild.  P:  Trending cell counts daily w/ CBC Transfuse for Hgb <7.0 SCDs Heparin [place] q8hr  INFECTIOUS A:   E coli UTI - S/P tx.  P:   Avoiding Foley Monitor for fever & leukocytosis  ENDOCRINE A:  Hyperglycemia - BG stable.  P:   D/C Accu-checks Monitor BG on daily labs  NEUROLOGIC A:   Myasthenia Gravis Exacerbation - Complete 5 days IVIG (3/19). Solu-Medrol stopped 3/22. Pain Post Perc Trach  P:   Neurology following peripherally Continue mestinon 60 q 6 Fentanyl IV prn  Zoloft home dose Klonopin 0.5mg  bid VT -home meds Ambulating with PT  FAMILY  - Updates: none at bedside  TODAY'S SUMMARY:  Myasthenic crisis appears to be resolved. Continuing tracheostomy collar trials today. [facility] would likely be a good LTAC candidate. Anxiety seems to be well-controlled. Intermittent periods of ventilator support required for neuromuscular weakness.   I have spent a total of  31 minutes of critical care time today caring for the patient and reviewing the patient's electronic medical record.   Sonia Baller Ashok Cordia, M.D. [facility] Pager:  [phone] After 3pm or if no response, call [phone] 05/26/2015, 7:53 AM

## 2015-05-26 NOTE — Progress Notes (Signed)
Patient placed on 28% trach collar.  Currently tolerating well.  Will continue to monitor.  

## 2015-05-27 LAB — RENAL FUNCTION PANEL
Albumin: 2.5 g/dL — ABNORMAL LOW (ref 3.5–5.0)
Anion gap: 7 (ref 5–15)
BUN: 22 mg/dL — ABNORMAL HIGH (ref 6–20)
CO2: 30 mmol/L (ref 22–32)
Calcium: 8.8 mg/dL — ABNORMAL LOW (ref 8.9–10.3)
Chloride: 109 mmol/L (ref 101–111)
Creatinine, Ser: 0.5 mg/dL (ref 0.44–1.00)
GFR calc Af Amer: >60 mL/min (ref >60)
GFR calc non Af Amer: >60 mL/min (ref >60)
Glucose, Bld: 123 mg/dL — ABNORMAL HIGH (ref 65–99)
Phosphorus: 3 mg/dL (ref 2.5–4.6)
Potassium: 3.2 mmol/L — ABNORMAL LOW (ref 3.5–5.1)
Sodium: 146 mmol/L — ABNORMAL HIGH (ref 135–145)

## 2015-05-27 LAB — CBC WITH DIFFERENTIAL/PLATELET
Basophils Absolute: 0 10*3/uL (ref 0.0–0.1)
Basophils Relative: 0 %
Eosinophils Absolute: 0 10*3/uL (ref 0.0–0.7)
Eosinophils Relative: 0 %
HCT: 33.2 % — ABNORMAL LOW (ref 36.0–46.0)
Hemoglobin: 10.1 g/dL — ABNORMAL LOW (ref 12.0–15.0)
Lymphocytes Relative: 15 %
Lymphs Abs: 2.5 10*3/uL (ref 0.7–4.0)
MCH: 25 pg — ABNORMAL LOW (ref 26.0–34.0)
MCHC: 30.4 g/dL (ref 30.0–36.0)
MCV: 82.2 fL (ref 78.0–100.0)
Monocytes Absolute: 0.6 10*3/uL (ref 0.1–1.0)
Monocytes Relative: 4 %
Neutro Abs: 13.2 10*3/uL — ABNORMAL HIGH (ref 1.7–7.7)
Neutrophils Relative %: 81 %
Platelets: 383 10*3/uL (ref 150–400)
RBC: 4.04 MIL/uL (ref 3.87–5.11)
RDW: 16.9 % — ABNORMAL HIGH (ref 11.5–15.5)
WBC: 16.3 10*3/uL — ABNORMAL HIGH (ref 4.0–10.5)

## 2015-05-27 LAB — GLUCOSE, CAPILLARY: Glucose-Capillary: 107 mg/dL — ABNORMAL HIGH (ref 65–99)

## 2015-05-27 LAB — MAGNESIUM: Magnesium: 2.4 mg/dL (ref 1.7–2.4)

## 2015-05-27 MED ORDER — SODIUM CHLORIDE 0.9% FLUSH
10.0000 mL | Freq: Two times a day (BID) | INTRAVENOUS | Status: DC
  Administered 2015-05-27 – 2015-05-30 (×8): 10 mL
  Administered 2015-05-31: 20 mL
  Administered 2015-06-01 – 2015-06-07 (×8): 10 mL

## 2015-05-27 MED ORDER — POTASSIUM CHLORIDE 10 MEQ/50ML IV SOLN
10.0000 meq | INTRAVENOUS | Status: AC
  Administered 2015-05-27 (×4): 10 meq via INTRAVENOUS
  Filled 2015-05-27 (×4): qty 50

## 2015-05-27 MED ORDER — ACETAMINOPHEN 160 MG/5ML PO SOLN
650.0000 mg | Freq: Four times a day (QID) | ORAL | Status: DC | PRN
  Administered 2015-05-27 – 2015-06-07 (×14): 650 mg
  Filled 2015-05-27 (×14): qty 20.3

## 2015-05-27 MED ORDER — SODIUM CHLORIDE 0.9% FLUSH: 10.0000 mL | INTRAVENOUS | Status: DC | PRN

## 2015-05-27 MED ORDER — SODIUM CHLORIDE 0.9% FLUSH: 10.0000 mL | Freq: Two times a day (BID) | INTRAVENOUS | Status: DC

## 2015-05-27 NOTE — Progress Notes (Signed)
eLink Physician-Brief Progress Note Patient Name: Frances Medina DOB: 08-Jul-1960 MRN: [id number]   Date of Service  05/27/2015  HPI/Events of Note  K+ = 3.2 and Creatinine = 0.50.  eICU Interventions  Will replete K+.     Intervention Category Intermediate Interventions: Electrolyte abnormality - evaluation and management  Trenyce Loera Eugene 05/27/2015, 5:52 AM

## 2015-05-27 NOTE — Progress Notes (Signed)
PULMONARY / CRITICAL CARE MEDICINE   Name: Frances Medina MRN: [id number] DOB: October 11, 1960    ADMISSION DATE:  05/15/2015 CONSULTATION DATE: 05/16/2015  REFERRING MD:   CHIEF COMPLAINT: Weakness, dysphagia, SOB in setting of known MG   HISTORY OF PRESENT ILLNESS:  55 y.o. female presenting to ED on 3/14 with increased weakness, SOB, and dysphagia over the last 7 d PTA.. Of note, she was recently diagnosed with myasthenia gravis during hospitalization in January 2017, during which time she required treatment with IVIG and was started on pyridostigmine. She had another hospitalization for MG flare in February, also requiring treatment with IVIG and steroid taper, thought to have been triggered by a UTI. She was supposed to have continued prednisone upon last hospital discharge 04/13/15, but patient was not aware that she had a prescription. She called her neurologist Dr. Audelia Acton last Friday about her dysphagia, and he recommended increasing frequency of pyridostigmine 60 mg to q6h from q8h. He also noted that patient would need to start steroids or be seen in the ED. Patient had an appointment to see him 05/16/15 with plans to start prednisone, but with increasing fatigue, she decided to be evaluated in the ED. Swallowing has become more difficult since last Friday, 05/11/15. She was only able to eat "a few nuggets" today. She was, however, able to take all but her evening dose of pyridostigmine. She has been able to drink fluids but not as much as usual. She and her husband report that she has trouble with her speech if she talks too much. She also complains of chest pain under her breast that feels like "it's swollen" and cuts off her breath since yesterday. She has had rhinorrhea with post-nasal drip and cough in the middle of the night since last weak but has not taken any new OTC medications. No sick contacts.In the ED on 3/14, patient's vital signs were stable, and SpO2 was 99% on room air. She was to be  admitted to the ICU. Since presentation her swallowing fxn declined further w/ difficulty swallowing oral secretions. A NIF was recorded at -25, and her VC was 534ml. PCCM was consulted given concern for impending respiratory failure.   SUBJECTIVE: Patient accidentally pulled out feeding tube & it had to be replaced. She continues to deny any pain or difficulty breathing. Has been on T-collar for over 24 hours now.  REVIEW OF SYSTEMS: Patient denies any nausea or vomiting as well as headache with head nod.  VITAL SIGNS: BP 108/73 mmHg  Pulse 72  Temp(Src) 98.4 F (36.9 C) (Oral)  Resp 20  Ht 5' (1.524 m)  Wt 117.6 kg (259 lb 4.2 oz)  BMI 50.63 kg/m2  SpO2 99%  HEMODYNAMICS:    VENTILATOR SETTINGS: Vent Mode:  [-]  FiO2 (%):  [28 %] 28 %  INTAKE / OUTPUT: I/O last 3 completed shifts: In: [id number] [I.V.:3; NG/GT:1260; IV Piggyback:50] Out: 350 [Urine:350]  PHYSICAL EXAMINATION: General:  Awake. Alert. On tracheostomy collar. Integument:  Warm & dry. No rash on exposed skin.  HEENT:  No scleral injection. Tracheostomy in place. Moist mucus membranes. Cardiovascular:  Regular rate. No edema. No appreciable JVD.  Pulmonary:  Clear bilaterally to auscultation. Normal work of breathing on tracheostomy collar. Abdomen: Soft. Normal bowel sounds. Nondistended.  Neurological:  Follows commands. Moving all 4 extremities equally. Grossly nonfocal.  LABS:  BMET  Recent Labs Lab 05/25/15 0605 05/26/15 0500 05/27/15 0435  NA 145 146* 146*  K 3.2* 3.4* 3.2*  CL  109 110 109  CO2 30 29 30   BUN 36* 27* 22*  CREATININE 0.61 0.51 0.50  GLUCOSE 124* 104* 123*    Electrolytes  Recent Labs Lab 05/25/15 0605 05/26/15 0500 05/27/15 0435  CALCIUM 8.5* 8.6* 8.8*  MG 2.4 2.5* 2.4  PHOS 3.5 3.1 3.0    CBC  Recent Labs Lab 05/25/15 0605 05/26/15 0500 05/27/15 0435  WBC 11.2* 12.9* 16.3*  HGB 9.0* 9.6* 10.1*  HCT 29.3* 31.2* 33.2*  PLT 362 349 383    Coag's  Recent  Labs Lab 05/23/15 0600  APTT 32  INR 1.19    Sepsis Markers No results for input(s): LATICACIDVEN, PROCALCITON, O2SATVEN in the last 168 hours.  ABG  Recent Labs Lab 05/22/15 1220  PHART 7.329*  PCO2ART 64.4*  PO2ART 197*    Liver Enzymes  Recent Labs Lab 05/25/15 0605 05/26/15 0500 05/27/15 0435  ALBUMIN 2.4* 2.6* 2.5*    Cardiac Enzymes No results for input(s): TROPONINI, PROBNP in the last 168 hours.  Glucose  Recent Labs Lab 05/25/15 1524 05/25/15 1937 05/25/15 2318 05/26/15 0323 05/26/15 0746 05/26/15 1126  GLUCAP 117* 119* 137* 116* 113* 124*    Imaging Dg Abd Portable 1v  05/26/2015  CLINICAL DATA:  Encounter for NG tube placement EXAM: PORTABLE ABDOMEN - 1 VIEW COMPARISON:  None. FINDINGS: The feeding tube extends into the stomach. The tip is within the fourth portion duodenum not changed from prior. IMPRESSION: No change in position of feeding tube. Electronically Signed   By: Suzy Bouchard M.D.   On: 05/26/2015 15:31     STUDIES:  CXR 3/14 >> Hypoinflation of the lungs with mild bibasilar subsegmental atelectasis  Abd X-ray 3/21: Feeding tube with tip in distal duodenum. Port CXR 3/22: Right internal jugular central venous catheter in good position. Endotracheal tube in good position. Low lung volumes. Minimal basilar atelectasis. Port CXR 3/22:  Trach in place w/ tip approximately 2.5cm above carina. No new opacity. R IJ CVL in place. Bilateral effusions right greater than left.  MICROBIOLOGY: Urine Ctx 3/14:  E coli MRSA Nasal Ctx 3/15:  Negative   ANTIBIOTICS: Bactrim 3/18 - 3/20   SIGNIFICANT EVENTS: 2/10 >> D/C from hospital  3/13 >> Onset of severe SOB and weakness  3/14 >> Present to ED  3/15 >> IVIG and ETT  3/18 >> Unsuccessful extubation , reintubated.  3/21 >> Unsuccessful extubation, reintubated 3/22 >> Trach placed by JY  LINES/TUBES: Shiley #6 Cuffed (Placed by Hyman Bible) 3/22>> R IJ CVL 3/15 >>  L Nare NGT 3/21 - 3/25;  3/25>>> OETT 7.0 3/15 - 3/18; 3/18 - 3/21; 3/21 - 3/22  ASSESSMENT / PLAN:  PULMONARY A: Acute Respiratory Failure due to flare of MG - Reintubated twice for stridor/ cord edema. Now s/p trach.  P:   Continue T-collar Vent available prn Continue PMV trials S/P Tracheostomy  CARDIOVASCULAR A:  H/O HTN - On no home medications. Normotensive here.   P:  Monitor on telemetry Vitals per unit protocol  RENAL A:   Hypokalemia - Mild. Replacing. Hypernatremia - Mild but stable.  P:   KCl 44mEq x4 runs Avoiding Foley Daily weights Trending renal function & electrolytes daily  GASTROINTESTINAL A:   Dysphagia  H/O GERD   P:  Continue Tube Feedings Pepcid VT bid Speech Eval as appropriate  HEMATOLOGIC A:   Anemia - Mild & Hgb stable. No signs of active bleeding. Leukocytosis - Worsening.  P:  Trending cell counts daily w/ CBC Transfuse for  Hgb <7.0 SCDs Heparin [place] q8hr  INFECTIOUS A:   E coli UTI - S/P tx.  P:   Avoiding Foley Monitor for fever & leukocytosis  ENDOCRINE A:   Hyperglycemia - BG stable.  P:   D/C Accu-checks Monitor BG on daily labs  NEUROLOGIC A:   Myasthenia Gravis Exacerbation - Complete 5 days IVIG (3/19). Solu-Medrol stopped 3/22. Pain Post Perc Trach  P:   Neurology following peripherally Continue mestinon 60 q 6 Fentanyl IV prn  Zoloft home dose Klonopin 0.5mg  bid VT -home meds Ambulating with PT  FAMILY   - Updates: None at bedside  TODAY'S SUMMARY:  Myasthenic crisis appears to be resolved. Continuing tracheostomy collar trials today. Ultimatly would likely be a good LTAC candidate. Anxiety seems to be well-controlled. Intermittent periods of ventilator support may required for neuromuscular weakness. Continue T-collar as long as respiratory status allows. PICC line for access. Plan to D/C central line once PICC is placed.   Sonia Baller Ashok Cordia, M.D. [facility] Pulmonary & Critical Care Pager:  [phone] After 3pm  or if no response, call [phone] 05/27/2015, 8:53 AM

## 2015-05-27 NOTE — Progress Notes (Signed)
Peripherally Inserted Central Catheter/Midline Placement  The IV Nurse has discussed with the patient and/or persons authorized to consent for the patient, the purpose of this procedure and the potential benefits and risks involved with this procedure.  The benefits include less needle sticks, lab draws from the catheter and patient may be discharged home with the catheter.  Risks include, but not limited to, infection, bleeding, blood clot (thrombus formation), and puncture of an artery; nerve damage and irregular heat beat.  Alternatives to this procedure were also discussed.  PICC/Midline Placement Documentation  PICC Single Lumen [id number] PICC Right Basilic 43 cm 0 cm (Active)  Indication for Insertion or Continuance of Line Prolonged intravenous therapies 05/27/2015 12:23 PM  Exposed Catheter (cm) 0 cm 05/27/2015 12:23 PM  Site Assessment Clean;Dry;Intact 05/27/2015 12:23 PM  Line Status Flushed;Saline locked;Blood return noted 05/27/2015 12:23 PM  Dressing Type Transparent 05/27/2015 12:23 PM  Dressing Status Clean;Dry;Intact 05/27/2015 12:23 PM  Dressing Change Due 06/03/15 05/27/2015 12:23 PM       Gordan Payment 05/27/2015, 12:24 PM

## 2015-05-27 NOTE — Evaluation (Signed)
[facility] Speaking Valve - Evaluation Patient Details  Name: Frances Medina MRN: [id number] Date of Birth: [birth year]  Today's Date: 05/27/2015 Time: 0826-0848 SLP Time Calculation (min) (ACUTE ONLY): 22 min  Past Medical History:  Past Medical History  Diagnosis Date  . GERD (gastroesophageal reflux disease)   . 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 Knee injury   . Depression   . Seasonal allergies     takes Zytrec  . Fibroid uterus     size of a dime  . Umbilical hernia     watching , no plans for surgery at present  . H/O hiatal hernia   . Hypertension     "went away when I stopped smoking"  . Anxiety   . Migraine     "maybe couple times/month" (03/20/2015)  . Arthritis     "knees" (03/20/2015)  . Osteoarthritis of right knee 08/30/2013  . Osteoarthritis of left knee 12/02/2013  . Myasthenia gravis ([place]) 2017  . E. coli UTI 04/07/2015   Past Surgical History:  Past Surgical History  Procedure Laterality Date  . Cesarean section  1987; 1989  . Dilation and curettage of uterus    . Tubal ligation  1989  . Vaginal hysterectomy  1990's?    "apparently took out one of my ovaries at the time too cause one's missing"  . Partial knee arthroplasty Right 08/30/2013    Procedure: RIGHT UNICOMPARTMENTAL KNEE;  Surgeon: Johnny Bridge, MD;  Location: [place];  Service: Orthopedics;  Laterality: Right;  . Colonoscopy with propofol N/A 09/19/2013    Procedure: COLONOSCOPY WITH PROPOFOL;  Surgeon: Ladene Artist, MD;  Location: [facility];  Service: Endoscopy;  Laterality: N/A;  . Partial knee arthroplasty Left 12/02/2013    Procedure: LEFT KNEE UNI ARTHROPLASTY;  Surgeon: Johnny Bridge, MD;  Location: [place];  Service: Orthopedics;  Laterality: Left;  . Esophagogastroduodenoscopy (egd) with propofol N/A 03/21/2015    Procedure: ESOPHAGOGASTRODUODENOSCOPY (EGD) WITH PROPOFOL;  Surgeon: Gatha Mayer, MD;  Location: [place];  Service: Endoscopy;  Laterality: N/A;    HPI:  Frances Medina is a 55 y.o. female presenting with myasthenia gravis exacerbation. PMH is significant for HTN, uterine fibroids, depression, anxiety, migraines and recently diagnosed myasthenia gravis, and undergoing work-up of splenomegaly with lymphadenopathy.Intubated 3/15, trach placed 3/22.   Assessment / Plan / Recommendation Clinical Impression  Pt cognitively intact during PMSV assessment. Coughed and removed 75% of secretions volitionally and assist from RN for deep suctioning. Exhaled air unable to efficiently transit to upper airway resulting in valve blowing valve off hub. Back pressure present x 2 upon PMV doff. Unable to clear pharyngeal secretions. Valve donned for intervals up to 10 seconds. Continue trials with ST only.        SLP Assessment  Patient needs continued Speech Lanaguage Pathology Services    Follow Up Recommendations  Inpatient Rehab    Frequency and Duration min 2x/week  2 weeks    PMSV Trial PMSV was placed for:  (10 second (longest interval)) Able to redirect subglottic air through upper airway: Yes Able to Attain Phonation: No Able to Expectorate Secretions: Yes Level of Secretion Expectoration with PMSV: Tracheal;Oral Breath Support for Phonation:  (mild-mod) Intelligibility: Unable to assess (comment) Respirations During Trial:  (15-19) SpO2 During Trial:  (95-98) Pulse During Trial:  [id number]) Behavior: Alert;Controlled;Cooperative;Good eye contact;Responsive to questions   Tracheostomy Tube       Vent Dependency  FiO2 (%): 28 %  Cuff Deflation Trial  GO Tolerated Cuff Deflation: Yes Length of Time for Cuff Deflation Trial:  (20 min) Behavior: Alert;Controlled;Cooperative;Good eye contact;Responsive to questions        Houston Siren 05/27/2015, 9:03 AM   Orbie Pyo Colvin Caroli.Ed [employer] [phone]

## 2015-05-27 NOTE — Progress Notes (Signed)
FPTS Social Note  Patient still undergoing tracheostomy collar trials. She is hopeful she can have rehab through CIR.   Appreciate the wonderful care CCM and Neurology are providing. Will continue to follow along until Family Medicine can resume care.   Daughter Deseree had provided cell phone number [phone] for any big updates.   Rogue Bussing, MD 05/27/2015, 10:16 AM PGY-1, [place] Medicine Service pager [phone]

## 2015-05-27 NOTE — Progress Notes (Signed)
eLink Physician-Brief Progress Note Patient Name: Frances Medina DOB: [birth year] MRN: [id number]   Date of Service  05/27/2015  HPI/Events of Note  Requesting tylenol prn HA  eICU Interventions  Will give prn per NG tube     Intervention Category Minor Interventions: Routine modifications to care plan (e.g. PRN medications for pain, fever)  Christinia Gully 05/27/2015, 10:43 PM

## 2015-05-28 ENCOUNTER — Inpatient Hospital Stay (HOSPITAL_COMMUNITY): Payer: BC Managed Care – PPO

## 2015-05-28 DIAGNOSIS — I10 Essential (primary) hypertension: Secondary | ICD-10-CM

## 2015-05-28 DIAGNOSIS — A047 Enterocolitis due to Clostridium difficile: Secondary | ICD-10-CM

## 2015-05-28 DIAGNOSIS — R0682 Tachypnea, not elsewhere classified: Secondary | ICD-10-CM | POA: Insufficient documentation

## 2015-05-28 DIAGNOSIS — F329 Major depressive disorder, single episode, unspecified: Secondary | ICD-10-CM

## 2015-05-28 DIAGNOSIS — Z930 Tracheostomy status: Secondary | ICD-10-CM

## 2015-05-28 DIAGNOSIS — G43909 Migraine, unspecified, not intractable, without status migrainosus: Secondary | ICD-10-CM

## 2015-05-28 DIAGNOSIS — A0472 Enterocolitis due to Clostridium difficile, not specified as recurrent: Secondary | ICD-10-CM | POA: Insufficient documentation

## 2015-05-28 DIAGNOSIS — K219 Gastro-esophageal reflux disease without esophagitis: Secondary | ICD-10-CM

## 2015-05-28 DIAGNOSIS — D72829 Elevated white blood cell count, unspecified: Secondary | ICD-10-CM

## 2015-05-28 LAB — CBC WITH DIFFERENTIAL/PLATELET
Basophils Absolute: 0 10*3/uL (ref 0.0–0.1)
Basophils Relative: 0 %
Eosinophils Absolute: 0 10*3/uL (ref 0.0–0.7)
Eosinophils Relative: 0 %
HCT: 33.9 % — ABNORMAL LOW (ref 36.0–46.0)
Hemoglobin: 10.5 g/dL — ABNORMAL LOW (ref 12.0–15.0)
Lymphocytes Relative: 12 %
Lymphs Abs: 1.8 10*3/uL (ref 0.7–4.0)
MCH: 25 pg — ABNORMAL LOW (ref 26.0–34.0)
MCHC: 31 g/dL (ref 30.0–36.0)
MCV: 80.7 fL (ref 78.0–100.0)
Monocytes Absolute: 0.5 10*3/uL (ref 0.1–1.0)
Monocytes Relative: 3 %
Neutro Abs: 13.5 10*3/uL — ABNORMAL HIGH (ref 1.7–7.7)
Neutrophils Relative %: 85 %
Platelets: 387 10*3/uL (ref 150–400)
RBC: 4.2 MIL/uL (ref 3.87–5.11)
RDW: 17.5 % — ABNORMAL HIGH (ref 11.5–15.5)
WBC: 15.8 10*3/uL — ABNORMAL HIGH (ref 4.0–10.5)

## 2015-05-28 LAB — GLUCOSE, CAPILLARY
Glucose-Capillary: 118 mg/dL — ABNORMAL HIGH (ref 65–99)
Glucose-Capillary: 119 mg/dL — ABNORMAL HIGH (ref 65–99)
Glucose-Capillary: 105 mg/dL — ABNORMAL HIGH (ref 65–99)
Glucose-Capillary: 112 mg/dL — ABNORMAL HIGH (ref 65–99)
Glucose-Capillary: 130 mg/dL — ABNORMAL HIGH (ref 65–99)
Glucose-Capillary: 115 mg/dL — ABNORMAL HIGH (ref 65–99)

## 2015-05-28 LAB — RENAL FUNCTION PANEL
Albumin: 2.7 g/dL — ABNORMAL LOW (ref 3.5–5.0)
Anion gap: 8 (ref 5–15)
BUN: 23 mg/dL — ABNORMAL HIGH (ref 6–20)
CO2: 29 mmol/L (ref 22–32)
Calcium: 8.9 mg/dL (ref 8.9–10.3)
Chloride: 106 mmol/L (ref 101–111)
Creatinine, Ser: 0.48 mg/dL (ref 0.44–1.00)
GFR calc Af Amer: >60 mL/min (ref >60)
GFR calc non Af Amer: >60 mL/min (ref >60)
Glucose, Bld: 114 mg/dL — ABNORMAL HIGH (ref 65–99)
Phosphorus: 3 mg/dL (ref 2.5–4.6)
Potassium: 3.5 mmol/L (ref 3.5–5.1)
Sodium: 143 mmol/L (ref 135–145)

## 2015-05-28 LAB — C DIFFICILE QUICK SCREEN W PCR REFLEX
C Diff antigen: POSITIVE — AB
C Diff toxin: NEGATIVE

## 2015-05-28 LAB — MAGNESIUM: Magnesium: 2.3 mg/dL (ref 1.7–2.4)

## 2015-05-28 MED ORDER — VITAL AF 1.2 CAL PO LIQD
1000.0000 mL | ORAL | Status: DC
  Administered 2015-05-28 – 2015-06-02 (×7): 1000 mL
  Filled 2015-05-28 (×12): qty 1000

## 2015-05-28 MED ORDER — CHLORHEXIDINE GLUCONATE 0.12 % MT SOLN
15.0000 mL | Freq: Two times a day (BID) | OROMUCOSAL | Status: DC
  Administered 2015-05-29 – 2015-06-08 (×19): 15 mL via OROMUCOSAL
  Filled 2015-05-28 (×17): qty 15

## 2015-05-28 MED ORDER — CETYLPYRIDINIUM CHLORIDE 0.05 % MT LIQD
7.0000 mL | Freq: Two times a day (BID) | OROMUCOSAL | Status: DC
  Administered 2015-05-29 – 2015-06-07 (×16): 7 mL via OROMUCOSAL

## 2015-05-28 NOTE — Progress Notes (Signed)
PULMONARY / CRITICAL CARE MEDICINE   Name: Frances Medina MRN: [id number] DOB: [birth year]    ADMISSION DATE:  05/15/2015 CONSULTATION DATE: 05/16/2015  REFERRING MD:   CHIEF COMPLAINT: Weakness, dysphagia, SOB in setting of known MG   HISTORY OF PRESENT ILLNESS:  55 y.o. female presenting to ED on 3/14 with increased weakness, SOB, and dysphagia over the last 7 d PTA.. Of note, she was recently diagnosed with myasthenia gravis during hospitalization in January 2017, during which time she required treatment with IVIG and was started on pyridostigmine. She had another hospitalization for MG flare in February, also requiring treatment with IVIG and steroid taper, thought to have been triggered by a UTI. She was supposed to have continued prednisone upon last hospital discharge 04/13/15, but patient was not aware that she had a prescription. She called her neurologist Dr. Audelia Acton last Friday about her dysphagia, and he recommended increasing frequency of pyridostigmine 60 mg to q6h from q8h. He also noted that patient would need to start steroids or be seen in the ED. Patient had an appointment to see him 05/16/15 with plans to start prednisone, but with increasing fatigue, she decided to be evaluated in the ED. Swallowing has become more difficult since last Friday, 05/11/15. She was only able to eat "a few nuggets" today. She was, however, able to take all but her evening dose of pyridostigmine. She has been able to drink fluids but not as much as usual. She and her husband report that she has trouble with her speech if she talks too much. She also complains of chest pain under her breast that feels like "it's swollen" and cuts off her breath since yesterday. She has had rhinorrhea with post-nasal drip and cough in the middle of the night since last weak but has not taken any new OTC medications. No sick contacts.In the ED on 3/14, patient's vital signs were stable, and SpO2 was 99% on room air. She was to be  admitted to the ICU. Since presentation her swallowing fxn declined further w/ difficulty swallowing oral secretions. A NIF was recorded at -25, and her VC was 565ml. PCCM was consulted given concern for impending respiratory failure.   SUBJECTIVE: Trache collar x 48 hrs >> comfortable. Pt has a lot of trache secretions -- requiring frequent suctioning. No other issues, Diarrhea is better. (-) abd pain. On TF.    REVIEW OF SYSTEMS: Patient denies any nausea or vomiting as well as headache with head nod. Has a lot of trache secretions.   VITAL SIGNS: BP 130/77 mmHg  Pulse 83  Temp(Src) 97.8 F (36.6 C) (Oral)  Resp 24  Ht 5' (1.524 m)  Wt 253 lb 15.5 oz (115.2 kg)  BMI 49.60 kg/m2  SpO2 99%  HEMODYNAMICS:    VENTILATOR SETTINGS: Vent Mode:  [-]  FiO2 (%):  [28 %] 28 %  INTAKE / OUTPUT: I/O last 3 completed shifts: In: 1713 [I.V.:3; NG/GT:1510; IV Piggyback:200] Out: 150 [Urine:150]  PHYSICAL EXAMINATION: General:  Awake. Alert. On tracheostomy collar. Integument:  Warm & dry. No rash on exposed skin.  HEENT:  No scleral injection. Tracheostomy in place. Moist mucus membranes. Yellow trache secretions Cardiovascular:  Regular rate. No edema. No appreciable JVD.  Pulmonary:  Clear bilaterally to auscultation. Normal work of breathing on tracheostomy collar. Abdomen: Soft. Normal bowel sounds. Nondistended.  Neurological:  Follows commands. Moving all 4 extremities equally. Grossly nonfocal.  LABS:  BMET  Recent Labs Lab 05/26/15 0500 05/27/15 0435 05/28/15 0500  NA 146* 146* 143  K 3.4* 3.2* 3.5  CL 110 109 106  CO2 29 30 29   BUN 27* 22* 23*  CREATININE 0.51 0.50 0.48  GLUCOSE 104* 123* 114*    Electrolytes  Recent Labs Lab 05/26/15 0500 05/27/15 0435 05/28/15 0500  CALCIUM 8.6* 8.8* 8.9  MG 2.5* 2.4 2.3  PHOS 3.1 3.0 3.0    CBC  Recent Labs Lab 05/26/15 0500 05/27/15 0435 05/28/15 0500  WBC 12.9* 16.3* 15.8*  HGB 9.6* 10.1* 10.5*  HCT 31.2*  33.2* 33.9*  PLT 349 383 387    Coag's  Recent Labs Lab 05/23/15 0600  APTT 32  INR 1.19    Sepsis Markers No results for input(s): LATICACIDVEN, PROCALCITON, O2SATVEN in the last 168 hours.  ABG  Recent Labs Lab 05/22/15 1220  PHART 7.329*  PCO2ART 64.4*  PO2ART 197*    Liver Enzymes  Recent Labs Lab 05/26/15 0500 05/27/15 0435 05/28/15 0500  ALBUMIN 2.6* 2.5* 2.7*    Cardiac Enzymes No results for input(s): TROPONINI, PROBNP in the last 168 hours.  Glucose  Recent Labs Lab 05/26/15 0746 05/26/15 1126 05/27/15 2001 05/27/15 2356 05/28/15 0355 05/28/15 0813  GLUCAP 113* 124* 107* 118* 119* 105*    Imaging No results found.   STUDIES:  CXR 3/14 >> Hypoinflation of the lungs with mild bibasilar subsegmental atelectasis  Abd X-ray 3/21: Feeding tube with tip in distal duodenum. Port CXR 3/22: Right internal jugular central venous catheter in good position. Endotracheal tube in good position. Low lung volumes. Minimal basilar atelectasis. Port CXR 3/22:  Trach in place w/ tip approximately 2.5cm above carina. No new opacity. R IJ CVL in place. Bilateral effusions right greater than left.  MICROBIOLOGY: Urine Ctx 3/14:  E coli MRSA Nasal Ctx 3/15:  Negative   ANTIBIOTICS: Bactrim 3/18 - 3/20   SIGNIFICANT EVENTS: 2/10 >> D/C from hospital  3/13 >> Onset of severe SOB and weakness  3/14 >> Present to ED  3/15 >> IVIG and ETT  3/18 >> Unsuccessful extubation , reintubated.  3/21 >> Unsuccessful extubation, reintubated 3/22 >> Trach placed by JY  LINES/TUBES: Shiley #6 Cuffed (Placed by Hyman Bible) 3/22>> R IJ CVL 3/15 >>  L Nare NGT 3/21 - 3/25; 3/25>>> OETT 7.0 3/15 - 3/18; 3/18 - 3/21; 3/21 - 3/22  ASSESSMENT / PLAN:  PULMONARY A: Acute Respiratory Failure due to flare of MG - Reintubated twice for stridor/ cord edema. Now s/p trach.  P:   Continue T-collar >> TC x 48hrs. Has a lot of yellow/clear secretions. Did not tolerate PM valve.   Cont trache #6 while with inc secretions Vent available prn Continue PMV trials S/P Tracheostomy Check CXR to make sure no new infxn with increased trache secretions  CARDIOVASCULAR A:  H/O HTN - On no home medications. Normotensive here.   P:  Monitor on telemetry Vitals per unit protocol  RENAL A:   No issues.   P:   Daily weights Trending renal function & electrolytes daily  GASTROINTESTINAL A:   Dysphagia  H/O GERD  Diarrhea >> better. C diff toxin (-) but Ag (+)   P:  Continue Tube Feedings Pepcid VT bid Speech Eval as appropriate May need a PEG if we can not downsize trache and with inc trache secretions Hold off on Rx Cdiff Ag.   HEMATOLOGIC A:   Anemia - Mild & Hgb stable. No signs of active bleeding. Leukocytosis - P:  Trending cell counts daily w/ CBC Transfuse for  Hgb <7.0 SCDs Heparin [place] q8hr  INFECTIOUS A:   E coli UTI - S/P tx.  P:   Avoiding Foley Monitor for fever & leukocytosis  ENDOCRINE A:   Hyperglycemia - BG stable.  P:   D/C Accu-checks Monitor BG on daily labs  NEUROLOGIC A:   Myasthenia Gravis Exacerbation - Complete 5 days IVIG (3/19). Solu-Medrol stopped 3/22. Pain Post Perc Trach  P:   Neurology following peripherally Continue mestinon 60 q 6 Fentanyl IV prn  Zoloft home dose Klonopin 0.5mg  bid VT -home meds Ambulating with PT  FAMILY   - Updates: None at bedside  TODAY'S SUMMARY:  Myasthenic crisis appears to be resolved. Continuing tracheostomy collar trials today. Ultimatly would likely be a good LTAC candidate. Anxiety seems to be well-controlled. Intermittent periods of ventilator support may required for neuromuscular weakness. Continue T-collar as long as respiratory status allows.  Pt with increased trache secretions >> keep on trache #6. Off vent x 48 hrs. May need a PEG.   I discussed the case with Family medicine today, with Dr. Ola Spurr. FM to be primary service. PCCM will consult and follow.    Monica Becton, MD 05/28/2015, 11:02 AM [facility] Pager [phone] After 3 pm or if no answer, call [phone]

## 2015-05-28 NOTE — Progress Notes (Signed)
Interval History:                                                                                                                      MAREESA Medina is an 55 y.o. female patient with myasthenia gravis, difficulty to wean off ventilator, status post tracheostomy. She is doing well with no weakness in upper or lower extremities has any facial weakness, no double vision. She is off steroids, status post IVIG. Tolerating Mestinon well. No new neurological symptoms    Past Medical History: Past Medical History  Diagnosis Date  . GERD (gastroesophageal reflux disease)   . 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 Knee injury   . Depression   . Seasonal allergies     takes Zytrec  . Fibroid uterus     size of a dime  . Umbilical hernia     watching , no plans for surgery at present  . H/O hiatal hernia   . Hypertension     "went away when I stopped smoking"  . Anxiety   . Migraine     "maybe couple times/month" (03/20/2015)  . Arthritis     "knees" (03/20/2015)  . Osteoarthritis of right knee 08/30/2013  . Osteoarthritis of left knee 12/02/2013  . Myasthenia gravis ([place]) 2017  . E. coli UTI 04/07/2015    Past Surgical History  Procedure Laterality Date  . Cesarean section  1987; 1989  . Dilation and curettage of uterus    . Tubal ligation  1989  . Vaginal hysterectomy  1990's?    "apparently took out one of my ovaries at the time too cause one's missing"  . Partial knee arthroplasty Right 08/30/2013    Procedure: RIGHT UNICOMPARTMENTAL KNEE;  Surgeon: Johnny Bridge, MD;  Location: [place];  Service: Orthopedics;  Laterality: Right;  . Colonoscopy with propofol N/A 09/19/2013    Procedure: COLONOSCOPY WITH PROPOFOL;  Surgeon: Ladene Artist, MD;  Location: [facility];  Service: Endoscopy;  Laterality: N/A;  . Partial knee arthroplasty Left 12/02/2013    Procedure: LEFT KNEE UNI ARTHROPLASTY;  Surgeon: Johnny Bridge, MD;  Location: [place];  Service: Orthopedics;  Laterality:  Left;  . Esophagogastroduodenoscopy (egd) with propofol N/A 03/21/2015    Procedure: ESOPHAGOGASTRODUODENOSCOPY (EGD) WITH PROPOFOL;  Surgeon: Gatha Mayer, MD;  Location: [place];  Service: Endoscopy;  Laterality: N/A;    Family History: Family History  Problem Relation Age of Onset  . Heart disease Mother 61  . Hypertension Mother   . Stroke Father   . Hypertension Father   . Colon cancer Neg Hx   . Other Sister     Esophageal strictures  . Anemia Sister     Social History:   reports that she quit smoking about 3 years ago. Her smoking use included Cigarettes. She has a 20 pack-year smoking history. She has never used smokeless tobacco. She reports that she does not drink alcohol or use illicit drugs.  Allergies:  Allergies  Allergen Reactions  . Dicyclomine Hives  . Methocarbamol Hives  . Penicillins Hives    Has patient had a PCN reaction causing immediate rash, facial/tongue/throat swelling, SOB or lightheadedness with hypotension: Yes Has patient had a PCN reaction causing severe rash involving mucus membranes or skin necrosis: No Has patient had a PCN reaction that required hospitalization No Has patient had a PCN reaction occurring within the last 10 years: No If all of the above answers are "NO", then may proceed with Cephalosporin use.   . Ciprofloxacin     Rash, shortness of breath  . Shrimp [Shellfish Allergy]      Medications:                                                                                                                         Current facility-administered medications:  .  acetaminophen (TYLENOL) solution 650 mg, 650 mg, Per Tube, Q6H PRN, Tanda Rockers, MD, 650 mg at 05/28/15 1541 .  albuterol (PROVENTIL HFA;VENTOLIN HFA) 108 (90 Base) MCG/ACT inhaler 2 puff, 2 puff, Inhalation, Q4H PRN, Rogue Bussing, MD .  antiseptic oral rinse solution (CORINZ), 7 mL, Mouth Rinse, 10 times per day, Rush Farmer, MD, 7 mL at 05/28/15  1800 .  chlorhexidine gluconate (PERIDEX) 0.12 % solution 15 mL, 15 mL, Mouth Rinse, BID, Rush Farmer, MD, 15 mL at 05/28/15 0800 .  clonazePAM (KLONOPIN) tablet 0.5 mg, 0.5 mg, Oral, BID, Rush Farmer, MD, 0.5 mg at 05/28/15 0928 .  docusate (COLACE) 50 MG/5ML liquid 100 mg, 100 mg, Per Tube, BID PRN, Erick Colace, NP .  famotidine (PEPCID) 40 MG/5ML suspension 20 mg, 20 mg, Per Tube, BID, Javier Glazier, MD, 20 mg at 05/28/15 [id number] .  feeding supplement (VITAL AF 1.2 CAL) liquid 1,000 mL, 1,000 mL, Per Tube, Continuous, Rush Farmer, MD, Last Rate: 65 mL/hr at 05/28/15 1541, 1,000 mL at 05/28/15 1541 .  fentaNYL (SUBLIMAZE) injection 100 mcg, 100 mcg, Intravenous, Q15 min PRN, Kara Mead V, MD .  fentaNYL (SUBLIMAZE) injection 100 mcg, 100 mcg, Intravenous, Q2H PRN, Kara Mead V, MD, 100 mcg at 05/27/15 1553 .  heparin injection 5,000 Units, 5,000 Units, Subcutaneous, 3 times per day, Javier Glazier, MD, 5,000 Units at 05/28/15 1315 .  iohexol (OMNIPAQUE) 350 MG/ML injection 100 mL, 100 mL, Intravenous, Once PRN, Marliss Coots, PA-C .  multivitamin with minerals tablet 1 tablet, 1 tablet, Oral, Daily, Asencion Islam, RD, 1 tablet at 05/28/15 [phone] .  pyridostigmine (MESTINON) tablet 60 mg, 60 mg, Per Tube, QID, Jaskirat Zertuche Fuller Mandril, MD, 60 mg at 05/28/15 1806 .  sertraline (ZOLOFT) 20 MG/ML concentrated solution 50 mg, 50 mg, Per Tube, Daily, Javier Glazier, MD, 50 mg at 05/28/15 [id number] .  sodium chloride flush (NS) 0.9 % injection 10-40 mL, 10-40 mL, Intracatheter, Q12H, Rush Farmer, MD, 10 mL at 05/28/15 1000 .  sodium chloride flush (NS)  0.9 % injection 10-40 mL, 10-40 mL, Intracatheter, PRN, Rush Farmer, MD .  sodium chloride flush (NS) 0.9 % injection 3 mL, 3 mL, Intravenous, Q12H, Rogue Bussing, MD, 3 mL at 05/28/15 1000   Neurologic Examination:                                                                                                      Today's Vitals   05/28/15 1200 05/28/15 1545 05/28/15 1556 05/28/15 1600  BP: 125/83   131/79  Pulse: 66  72 74  Temp:    97.2 F (36.2 C)  TempSrc:    Oral  Resp: 22  26 24   Height:      Weight:      SpO2: 98%  98% 97%  PainSc:  6   2    Alert, oriented, nonverbal, status post tracheostomy, cranial nerve to 12 grossly intact, full extraocular movements, symmetric strength in all 4 extent his, no drift.    Lab Results: Basic Metabolic Panel:  Recent Labs Lab 05/24/15 0524 05/25/15 0605 05/26/15 0500 05/27/15 0435 05/28/15 0500  NA 144 145 146* 146* 143  K 3.5 3.2* 3.4* 3.2* 3.5  CL 105 109 110 109 106  CO2 31 30 29 30 29   GLUCOSE 115* 124* 104* 123* 114*  BUN 35* 36* 27* 22* 23*  CREATININE 0.72 0.61 0.51 0.50 0.48  CALCIUM 8.8* 8.5* 8.6* 8.8* 8.9  MG 2.7* 2.4 2.5* 2.4 2.3  PHOS 3.6 3.5 3.1 3.0 3.0    Liver Function Tests:  Recent Labs Lab 05/24/15 0524 05/25/15 0605 05/26/15 0500 05/27/15 0435 05/28/15 0500  ALBUMIN 2.3* 2.4* 2.6* 2.5* 2.7*   No results for input(s): LIPASE, AMYLASE in the last 168 hours. No results for input(s): AMMONIA in the last 168 hours.  CBC:  Recent Labs Lab 05/24/15 0524 05/25/15 0605 05/26/15 0500 05/27/15 0435 05/28/15 0500  WBC 13.4* 11.2* 12.9* 16.3* 15.8*  NEUTROABS 9.4* 8.0* 10.5* 13.2* 13.5*  HGB 9.1* 9.0* 9.6* 10.1* 10.5*  HCT 28.7* 29.3* 31.2* 33.2* 33.9*  MCV 80.6 80.9 81.3 82.2 80.7  PLT 374 362 349 383 387    Cardiac Enzymes: No results for input(s): CKTOTAL, CKMB, CKMBINDEX, TROPONINI in the last 168 hours.  Lipid Panel:  Recent Labs Lab 05/22/15 1245 05/25/15 0605  TRIG 127 150*    CBG:  Recent Labs Lab 05/27/15 2356 05/28/15 0355 05/28/15 0813 05/28/15 1150 05/28/15 1629  GLUCAP 118* 119* 105* 112* 130*    Microbiology: Results for orders placed or performed during the hospital encounter of 05/15/15  Culture, Urine     Status: None   Collection Time: 05/15/15 10:30 PM  Result  Value Ref Range Status   Specimen Description URINE, RANDOM  Final   Special Requests NONE  Final   Culture >=100,000 COLONIES/mL ESCHERICHIA COLI  Final   Report Status 05/18/2015 FINAL  Final   Organism ID, Bacteria ESCHERICHIA COLI  Final      Susceptibility   Escherichia coli - MIC*    AMPICILLIN 4 SENSITIVE Sensitive  CEFAZOLIN <=4 SENSITIVE Sensitive     CEFTRIAXONE <=1 SENSITIVE Sensitive     CIPROFLOXACIN >=4 RESISTANT Resistant     GENTAMICIN <=1 SENSITIVE Sensitive     IMIPENEM <=0.25 SENSITIVE Sensitive     NITROFURANTOIN <=16 SENSITIVE Sensitive     TRIMETH/SULFA <=20 SENSITIVE Sensitive     AMPICILLIN/SULBACTAM 4 SENSITIVE Sensitive     PIP/TAZO <=4 SENSITIVE Sensitive     * >=100,000 COLONIES/mL ESCHERICHIA COLI  MRSA culture     Status: None   Collection Time: 05/16/15  1:45 PM  Result Value Ref Range Status   Specimen Description NASOPHARYNGEAL  Final   Special Requests NONE  Final   Culture NOMRSA Performed at [facility]   Final   Report Status 05/18/2015 FINAL  Final  C difficile quick scan w PCR reflex     Status: Abnormal   Collection Time: 05/27/15  9:17 PM  Result Value Ref Range Status   C Diff antigen POSITIVE (A) NEGATIVE Final   C Diff toxin NEGATIVE NEGATIVE Final   C Diff interpretation   Final    C. difficile present, but toxin not detected. This indicates colonization. In most cases, this does not require treatment. If patient has signs and symptoms consistent with colitis, consider treatment. Requires ENTERIC precautions.    Imaging: Dg Chest 1 View  05/28/2015  CLINICAL DATA:  Patient with respiratory failure. Shortness of breath. EXAM: CHEST 1 VIEW COMPARISON:  Chest radiograph 05/23/2015 FINDINGS: Tracheostomy tube terminates in the mid trachea. Enteric tube courses inferior to the diaphragm. Right upper extremity PICC line is present with tip projecting over the right atrium. Low lung volumes. Stable cardiac and mediastinal  contours. Small bilateral pleural effusions. Bibasilar airspace opacities. No pneumothorax. IMPRESSION: Right upper extremity PICC line is present with tip projecting over the right atrium. Low lung volumes, small bilateral pleural effusions and underlying opacities favored to represent atelectasis or infection. Associated interstitial pulmonary edema. Electronically Signed   By: Lovey Newcomer M.D.   On: 05/28/2015 13:06   Dg Abd Portable 1v  05/26/2015  CLINICAL DATA:  Encounter for NG tube placement EXAM: PORTABLE ABDOMEN - 1 VIEW COMPARISON:  None. FINDINGS: The feeding tube extends into the stomach. The tip is within the fourth portion duodenum not changed from prior. IMPRESSION: No change in position of feeding tube. Electronically Signed   By: Suzy Bouchard M.D.   On: 05/26/2015 15:31    Assessment and plan:   Frances Medina is an 55 y.o. female patient with myasthenia gravis, respiratory failure, with difficulty to wean off of the ventilator, now status post tracheostomy, otherwise stable from myasthenia standpoint. No limb weakness. Tolerating Mestinon well. No new neurologic issues to address at this time.  We'll sign off. Please call for any further questions

## 2015-05-28 NOTE — Progress Notes (Signed)
Family Medicine Teaching Service Daily Progress Note Intern Pager: [phone]  Patient name: Frances Medina Medical record number: [id number] Date of birth: 28-Jul-1960 Age: 55 y.o. Gender: female  Primary Care Provider: Andrena Mews, MD Consultants: Pulmonology, Neurology Code Status: Full  Pt Overview and Major Events to Date:  2/10 >> D/C from hospital  3/13 >> Onset of severe SOB and weakness  3/14 >> Presented to ED  3/15 >> Transferred to Neuro ICU, IVIG and ETT  3/18 >> Unsuccessful extubation , reintubated.  3/19 >> Completed 5 days IVIG 3/21 >> Unsuccessful extubation, reintubated 3/22 >> Trach placed, solu-medrol 3/22   Assessment and Plan: Frances Medina is a 55 y.o. female presenting with myasthenia gravis exacerbation. PMH is significant for HTN,uterine fibroids, depression, anxiety, migraines and recently diagnosed myasthenia gravis, and undergoing work-up of splenomegaly with lymphadenopathy.   Myasthenia Gravis Exacerbation: Unclear trigger of symptoms, though discontinuation of steroids is most likely cause. Rhinorrhea but no fevers or leukocytosis. UA was positive for nitrites and trace leukocytes. Initial plan was to admit to SDU on continuous cardiopulmonary monitoring. However, initial NIF score was -25, so patient was transferred to ICU and intubated. Follows with Neurologist Dr. Audelia Acton of [facility]. She has tolerated trach collar for 48 hours now.  - Neurology consulting, appreciate recommendations: s/p IV solumedrol and 5 days IVIG; to see patient today - Pulmonology following respiratory secretions and hopeful to downsize trach, otherwise feeding tube may be needed in next couple of days; anticipate discharge to [facility] because of respiratory secretions - s/p perc trach on 28% FiO2; appreciate pulmonology recommendations - Tube feedings - Pyridostigmine 60 mg qid (increased from TID prior to admission) - PT/OT consulted --> HH PT, intermittent  supervision - SLP --> Recommends inpatient rehab; CIR evaluation ordered - Continue home prn albuterol for wheezing/SOB - Drugs to avoid in MG: aminoglycosides, clindamycin, fluoroquinolones, ketolides, vancomycin, beta blockers, procainamide, quinidine, anti-PD-1 monoclonal antibodies, botox, chloroquine, hydroxychloroquine, magnesium, penicillamine, quinine  Diarrhea: - Began yesterday; has leukocytosis to 15.8 today, down from 16.3 yesterday - C. Diff panel resulted as antibody positive, toxin negative, so will not start flagyl  HFpEF: ECHO 04/09/15 performed due to SOB showed EF of 60-65% and G1DD. Patient currently appears euvolemic. - Monitor fluid status as administer MIVFs while NPO.   Splenomegaly w/Lymphadenopathy: Being followed by Oncologist Dr. Irene Limbo. Work-up thus far has shown HIV neg, mono screen neg, elevated LDH at 395 and low haptoglobin at <10 (with recent IVIG treatment). To repeat hemolytic markers, CBC and Coombs' test later this month at follow-up. No hypermetabolic masses or lymphadenopathy seen on PET scan 05/14/15. - Continue to monitor CBCs - Consider pursuing autoimmune work-up while hospitalized   Hx of HTN: 103/70-145/104. - Not on any home anti-hypertensives - Continue to monitor. Will add prn if needed.   Depression and Anxiety: - Zoloft 50 mg daily daily - Klonopin 0.5 mg BID  E. Coli UTI: s/p 3 days bactrim  FEN/GI: Tube feeds, pepcid Prophylaxis: Heparin  Disposition: Transferred to SDU overnight, awaiting CIR evaluation and placement recommendations and possible change in tracheostomy tube if secretions improve, if not may need feeding tube; remain in SDU due to respiratory secretions  Subjective:  Tylenol ordered last night for headache. Still having diarrhea. Still having a lot of respiratory secretions. Able to walk to and from bathroom with minimal assistance (carrying lines).   Objective: Temp:  [97.4 F (36.3 C)-98.2 F (36.8 C)] 97.8 F  (36.6 C) (03/27 0814) Pulse Rate:  [  63-113] 82 (03/27 0814) Resp:  [16-25] 18 (03/27 0814) BP: (103-145)/(70-104) 144/97 mmHg (03/27 0814) SpO2:  [95 %-100 %] 95 % (03/27 0814) FiO2 (%):  [28 %] 28 % (03/27 0814) Weight:  [253 lb 15.5 oz (115.2 kg)] 253 lb 15.5 oz (115.2 kg) (03/27 0356) Physical Exam: General: Obese female, resting in bed awake, able to sit up with ease for exam  Cardiovascular: RRR, S1, S2, no m/r/g Chest: Lungs CTAB, trach collar in place Abdomen: +BS, soft, NT, ND Extremities: Moves all spontaneously, Strength 5-/5 of LE and 5/5 UEs  Laboratory:  Recent Labs Lab 05/26/15 0500 05/27/15 0435 05/28/15 0500  WBC 12.9* 16.3* 15.8*  HGB 9.6* 10.1* 10.5*  HCT 31.2* 33.2* 33.9*  PLT 349 383 387    Recent Labs Lab 05/26/15 0500 05/27/15 0435 05/28/15 0500  NA 146* 146* 143  K 3.4* 3.2* 3.5  CL 110 109 106  CO2 '29 30 29  ' BUN 27* 22* 23*  CREATININE 0.51 0.50 0.48  CALCIUM 8.6* 8.8* 8.9  GLUCOSE 104* 123* 114*    Imaging/Diagnostic Tests: Dg Chest 2 View  05/15/2015  CLINICAL DATA:  Shortness of breath. EXAM: CHEST  2 VIEW COMPARISON:  April 07, 2015. FINDINGS: The heart size and mediastinal contours are within normal limits. Hypoinflation of the lungs is noted. Mild bibasilar subsegmental atelectasis is noted. No pneumothorax or pleural effusion is noted. The visualized skeletal structures are unremarkable. IMPRESSION: Hypoinflation of the lungs with mild bibasilar subsegmental atelectasis. Electronically Signed   By: Marijo Conception, M.D.   On: 05/15/2015 21:06   Nm Pet Image Initial (pi) Skull Base To Thigh  05/14/2015  CLINICAL DATA:  Initial treatment strategy for lymphadenopathy and splenomegaly. Possibly lymphoproliferative disease. Myasthenia gravis. EXAM: NUCLEAR MEDICINE PET SKULL BASE TO THIGH TECHNIQUE: 14.8 mCi F-18 FDG was injected intravenously. Full-ring PET imaging was performed from the skull base to thigh after the radiotracer. CT data  was obtained and used for attenuation correction and anatomic localization. FASTING BLOOD GLUCOSE:  Value: 97 mg/dl COMPARISON:  Chest CTA on 04/07/2015 FINDINGS: NECK No hypermetabolic lymph nodes in the neck. CHEST No hypermetabolic mediastinal or hilar nodes. No suspicious pulmonary nodules on the CT scan. ABDOMEN/PELVIS No abnormal hypermetabolic activity within the liver, pancreas, adrenal glands, or spleen. No evidence of splenomegaly. No hypermetabolic lymph nodes in the abdomen or pelvis. SKELETON No focal hypermetabolic activity to suggest skeletal metastasis. Mild diffusely increased hypermetabolic activity seen throughout the axial and appendicular bone marrow. IMPRESSION: No hypermetabolic masses or lymphadenopathy identified. Mild diffusely increased metabolic activity throughout the bone marrow, without focal metastatic lesions. This finding is of uncertain clinical significance, and is likely due to marrow stimulation. Bone marrow biopsy could be considered further evaluation if clinically warranted. Electronically Signed   By: Earle Gell M.D.   On: 05/14/2015 16:33   Dg Chest Port 1 View  05/23/2015  CLINICAL DATA:  Tracheostomy placement. EXAM: PORTABLE CHEST 1 VIEW COMPARISON:  Study obtained earlier in the day FINDINGS: There is now a tracheostomy present with the tracheostomy catheter tip 2.4 cm above the carina. Feeding tube tip is below the diaphragm. Central catheter tip is at the cavoatrial junction. No pneumothorax. There are bilateral pleural effusions, larger on the right than on the left. There is atelectatic change in the bases. Heart is mildly enlarged. The pulmonary vascularity appears normal. No adenopathy. No bone lesions. IMPRESSION: Tube and catheter positions as described without pneumothorax. Mild cardiomegaly with bilateral pleural effusions. Suspect a degree  of congestive heart failure. There is bibasilar atelectasis. Electronically Signed   By: Lowella Grip III M.D.    On: 05/23/2015 16:17   Portable Chest Xray  05/23/2015  CLINICAL DATA:  55 year old female female with respiratory failure. Myasthenia gravis. Initial encounter. EXAM: PORTABLE CHEST 1 VIEW COMPARISON:  05/22/2015 and earlier. FINDINGS: Portable AP semi upright view at 0604 hours. Stable endotracheal tube tip at the level the clavicles. Enteric tube now in place, courses to the abdomen and tip not included. Multiple EKG leads and wires also overlie the chest. Stable right IJ central line. Continued low lung volumes with right greater than left basilar veiling opacity. No pneumothorax. No acute pulmonary edema. Mediastinal contours remain within normal limits. IMPRESSION: 1. Enteric tube placed and courses to the abdomen, tip not included. Otherwise, stable lines and tubes. 2. Stable ventilation. Low lung volumes with bibasilar opacity probably reflecting atelectasis and small effusions. Electronically Signed   By: Genevie Ann M.D.   On: 05/23/2015 07:36   Portable Chest Xray  05/22/2015  CLINICAL DATA:  Re-intubation today EXAM: PORTABLE CHEST 1 VIEW COMPARISON:  05/22/2015 FINDINGS: Unchanged position of endotracheal tube and internal jugular central line. Cardiac enlargement stable. Bibasilar hypoventilatory change with low lung volumes. IMPRESSION: Bilateral lower lobe atelectasis. Findings similar to prior study, with mildly increased opacity at the bases. Electronically Signed   By: Skipper Cliche M.D.   On: 05/22/2015 12:14   Dg Chest Port 1 View  05/22/2015  CLINICAL DATA:  Acute respiratory failure EXAM: PORTABLE CHEST 1 VIEW COMPARISON:  05/21/15 FINDINGS: Cardiomediastinal silhouette is stable. Endotracheal tube in place with tip 2.5 cm above the carina. NG tube in place. Right IJ central line is unchanged in position. Central mild vascular congestion without convincing pulmonary edema persistent mild basilar atelectasis. No segmental infiltrate. IMPRESSION: Stable support apparatus. Central mild  vascular congestion without convincing pulmonary edema. Persistent bilateral basilar atelectasis. Electronically Signed   By: Lahoma Crocker M.D.   On: 05/22/2015 07:50   Dg Chest Port 1 View  05/21/2015  CLINICAL DATA:  Acute respiratory failure EXAM: PORTABLE CHEST 1 VIEW COMPARISON:  05/19/2015 FINDINGS: Endotracheal tube terminates 5 cm above the carina. Mild bibasilar opacities, likely atelectasis. No frank interstitial edema. No pleural effusion or pneumothorax. The heart is normal in size. Right IJ venous catheter terminates the cavoatrial junction. Enteric tube courses into the mid stomach. IMPRESSION: Endotracheal tube terminates 5 cm above the carina. Mild bibasilar opacities, likely atelectasis. No frank interstitial edema. Electronically Signed   By: Julian Hy M.D.   On: 05/21/2015 10:45   Dg Chest Port 1 View  05/19/2015  CLINICAL DATA:  Hypoxia EXAM: PORTABLE CHEST 1 VIEW COMPARISON:  May 19, 2015 study obtained earlier in the day FINDINGS: Endotracheal tube tip is 9 mm above the carina. Central catheter tip is at the cavoatrial junction. Nasogastric tube tip and side port are below the diaphragm in the stomach. No pneumothorax. There is no edema or consolidation. Heart size and pulmonary vascularity are within normal limits. No adenopathy. No bone lesions apparent. IMPRESSION: Tube and catheter positions as described without pneumothorax. Note that the endotracheal tube tip is close to the carina. It may be prudent to withdrawal endotracheal tube approximately 3 cm. No edema or consolidation. These results will be called to the ordering clinician or representative by the Radiologist Assistant, and communication documented in the PACS or zVision Dashboard. Electronically Signed   By: Lowella Grip III M.D.   On: 05/19/2015 11:16  Dg Chest Port 1 View  05/19/2015  CLINICAL DATA:  Acute respiratory failure EXAM: PORTABLE CHEST 1 VIEW COMPARISON:  May 18, 2015 FINDINGS: The ETT is in  good position. No other change in support apparatus. No pneumothorax. Bibasilar atelectasis, right greater than left. No other interval changes. IMPRESSION: The ETT is in good position.  No other change. Electronically Signed   By: Dorise Bullion III M.D   On: 05/19/2015 08:11   Dg Chest Port 1 View  05/18/2015  CLINICAL DATA:  Acute respiratory failure.  Endotracheal tube. EXAM: PORTABLE CHEST 1 VIEW COMPARISON:  05/16/2015 FINDINGS: Endotracheal tube terminates approximately 2.5 cm above the carina. Right jugular central venous catheter terminates over the cavoatrial junction. Enteric tube courses into the left upper abdomen with tip not imaged. Lung volumes remain diminished with persistent mild patchy opacities in both lung bases. Trace bilateral pleural effusions are questioned. No pneumothorax. IMPRESSION: 1. Support devices as above. 2. Low lung volumes with persistent mild bibasilar opacities which may reflect atelectasis. Electronically Signed   By: Logan Bores M.D.   On: 05/18/2015 08:21   Dg Chest Portable 1 View  05/16/2015  CLINICAL DATA:  Status post central line placement EXAM: PORTABLE CHEST 1 VIEW COMPARISON:  05/15/2015 FINDINGS: Cardiac shadow is stable. An endotracheal tube is now seen and lies just above the carina and should be withdrawn 1-2 cm. A nasogastric catheter is seen coiled within the stomach. A right jugular central line is noted at the cavoatrial junction. No pneumothorax is seen. Bilateral pleural effusions are noted with bibasilar patchy opacities. No bony abnormality is seen. IMPRESSION: Endotracheal tube at the level of the carina and should be withdrawn 1-2 cm. No pneumothorax following central line placement. Small bilateral pleural effusions and bibasilar patchy opacities. These results will be called to the ordering clinician or representative by the Radiologist Assistant, and communication documented in the PACS or zVision Dashboard. Electronically Signed   By: Inez Catalina M.D.   On: 05/16/2015 13:35   Dg Abd Portable 1v  05/26/2015  CLINICAL DATA:  Encounter for NG tube placement EXAM: PORTABLE ABDOMEN - 1 VIEW COMPARISON:  None. FINDINGS: The feeding tube extends into the stomach. The tip is within the fourth portion duodenum not changed from prior. IMPRESSION: No change in position of feeding tube. Electronically Signed   By: Suzy Bouchard M.D.   On: 05/26/2015 15:31   Dg Abd Portable 1v  05/22/2015  CLINICAL DATA:  Feeding tube placement EXAM: PORTABLE ABDOMEN - 1 VIEW COMPARISON:  05/19/2015 FINDINGS: NG tube is been exchange for a feeding tube. Feeding tube tip is in the distal fourth portion of the duodenum. Normal bowel gas pattern. No significant dilatation or obstruction. No acute osseous finding. IMPRESSION: Feeding tube tip distal duodenum. Electronically Signed   By: Jerilynn Mages.  Shick M.D.   On: 05/22/2015 15:29   Dg Abd Portable 1v  05/19/2015  CLINICAL DATA:  Evaluate OG tube. EXAM: PORTABLE ABDOMEN - 1 VIEW COMPARISON:  May 16, 2015 FINDINGS: The OG tube terminates in the distal stomach. No other acute abnormalities identified. IMPRESSION: Appropriate placement of OG tube. Electronically Signed   By: Dorise Bullion III M.D   On: 05/19/2015 11:17   Dg Abd Portable 1v  05/16/2015  CLINICAL DATA:  OG tube placement EXAM: PORTABLE ABDOMEN - 1 VIEW COMPARISON:  None. FINDINGS: Enteric tube is in place with the tip in the very pyloric region and the side port in the distal stomach. Nonobstructive bowel  gas pattern. IMPRESSION: Enteric tube tip in the peripyloric region. Electronically Signed   By: Rolm Baptise M.D.   On: 05/16/2015 13:34    Hillary Corinda Gubler, MD 05/28/2015, 8:32 AM PGY-1, [place] Intern pager: [phone], text pages welcome

## 2015-05-28 NOTE — Consult Note (Signed)
Physical Medicine and Rehabilitation Consult   Reason for Consult: MG exacerbation.  Referring Physician:  Dr. Nelda Marseille   HPI: Frances Medina is a 55 y.o. female with history of GERD, depression, Migraines. Recent diagnosis of MG 03/2015 with exacerbation 04/2015 due to UTI who was readmitted on 05/15/15 with progressive SOB, bilateral ptosis and progressive dysphagia. Patient had failed to start steroids at discharge (as not aware of Rx) and was felt to have MG exacerbation as well as questionable UTI. She was started on IVIG and required intubation in ED. She was treated for E coli UTI.  She required pressors due to hypotension and failed attempts at extubation on 03/18 due to pharyngeal edema and and treated with IV solumedrol. Ptosis has improved and no motor weakness reported. She was unable to tolerate extubation attempts on 03/21 requiring trach at bedside by Dr. Nelda Marseille. On panda tube feeds and has been tolerating trach collar. PMSV trials attempted but patient unable to clear pharyngeal secretions.  CIR recommended due to debility, dysphagia as well as PMSV use.     Reports that she did not get to sit up this weekend.  Pulm reports no plans on downsizing due to copious secretions. Tube feeds currently at 35 cc/hr with ongoing issues with diarrhea. Stool positive for C diff antigen/negative for toxin.    Review of Systems  HENT: Negative for hearing loss.   Eyes: Negative for blurred vision and double vision.  Respiratory: Positive for cough and sputum production.   Cardiovascular: Negative for chest pain and palpitations.  Gastrointestinal: Positive for diarrhea. Negative for heartburn, nausea and abdominal pain.  Genitourinary: Positive for urgency. Negative for dysuria.  Musculoskeletal: Negative for myalgias, back pain, joint pain and neck pain.  Skin: Negative for itching and rash.  Neurological: Positive for speech change and weakness. Negative for dizziness, tingling, sensory  change, focal weakness and headaches.  Psychiatric/Behavioral: Negative for depression and memory loss. The patient is not nervous/anxious.   All other systems reviewed and are negative.  Past Medical History  Diagnosis Date  . GERD (gastroesophageal reflux disease)   . 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 Knee injury   . Depression   . Seasonal allergies     takes Zytrec  . Fibroid uterus     size of a dime  . Umbilical hernia     watching , no plans for surgery at present  . H/O hiatal hernia   . Hypertension     "went away when I stopped smoking"  . Anxiety   . Migraine     "maybe couple times/month" (03/20/2015)  . Arthritis     "knees" (03/20/2015)  . Osteoarthritis of right knee 08/30/2013  . Osteoarthritis of left knee 12/02/2013  . Myasthenia gravis ([facility]) 2017  . E. coli UTI 04/07/2015    Past Surgical History  Procedure Laterality Date  . Cesarean section  1987; 1989  . Dilation and curettage of uterus    . Tubal ligation  1989  . Vaginal hysterectomy  1990's?    "apparently took out one of my ovaries at the time too cause one's missing"  . Partial knee arthroplasty Right 08/30/2013    Procedure: RIGHT UNICOMPARTMENTAL KNEE;  Surgeon: Johnny Bridge, MD;  Location: [facility];  Service: Orthopedics;  Laterality: Right;  . Colonoscopy with propofol N/A 09/19/2013    Procedure: COLONOSCOPY WITH PROPOFOL;  Surgeon: Ladene Artist, MD;  Location: [facility];  Service:  Endoscopy;  Laterality: N/A;  . Partial knee arthroplasty Left 12/02/2013    Procedure: LEFT KNEE UNI ARTHROPLASTY;  Surgeon: Johnny Bridge, MD;  Location: [facility];  Service: Orthopedics;  Laterality: Left;  . Esophagogastroduodenoscopy (egd) with propofol N/A 03/21/2015    Procedure: ESOPHAGOGASTRODUODENOSCOPY (EGD) WITH PROPOFOL;  Surgeon: Gatha Mayer, MD;  Location: [facility];  Service: Endoscopy;  Laterality: N/A;    Family History  Problem Relation Age of Onset  . Heart disease Mother  16  . Hypertension Mother   . Stroke Father   . Hypertension Father   . Colon cancer Neg Hx   . Other Sister     Esophageal strictures  . Anemia Sister    Social History:  Married--husband works.  Disabled due to knees. Was ambulating with cane or walker PTA. She reports that she quit smoking about 3 years ago. Her smoking use included Cigarettes. She has a 20 pack-year smoking history. She has never used smokeless tobacco. She reports that she does not drink alcohol or use illicit drugs.  Allergies  Allergen Reactions  . Dicyclomine Hives  . Methocarbamol Hives  . Penicillins Hives    Has patient had a PCN reaction causing immediate rash, facial/tongue/throat swelling, SOB or lightheadedness with hypotension: Yes Has patient had a PCN reaction causing severe rash involving mucus membranes or skin necrosis: No Has patient had a PCN reaction that required hospitalization No Has patient had a PCN reaction occurring within the last 10 years: No If all of the above answers are "NO", then may proceed with Cephalosporin use.   . Ciprofloxacin     Rash, shortness of breath  . Shrimp [Shellfish Allergy]     Medications Prior to Admission  Medication Sig Dispense Refill  . albuterol (PROVENTIL HFA;VENTOLIN HFA) 108 (90 Base) MCG/ACT inhaler Inhale 2 puffs into the lungs every 4 (four) hours as needed for wheezing or shortness of breath. 1 Inhaler 0  . Chlorphen-PE-Acetaminophen (NOREL AD) 4-10-325 MG TABS Take 1 tablet by mouth 2 (two) times daily as needed.    . clonazePAM (KLONOPIN) 1 MG tablet Take 0.5-1 mg by mouth 2 (two) times daily as needed for anxiety.    [name] estradiol (ESTRACE) 0.5 MG tablet Take 0.5 mg by mouth daily.    [name] LORazepam (ATIVAN) 2 MG tablet Take 1 tablet (2 mg total) by mouth once as needed for anxiety or sedation (use one tablet 15 -58min before PET scan/CT. Do not use with Klonopin and ensure someone drives you to and from your procedure). 2 tablet 0  . pyridostigmine  (MESTINON) 60 MG tablet Take 1 tablet (60 mg total) by mouth every 8 (eight) hours. Generic OK 90 tablet 0  . sertraline (ZOLOFT) 100 MG tablet Take 50 mg by mouth every morning.     . Vitamin D, Ergocalciferol, (DRISDOL) 50000 units CAPS capsule Take 1 capsule (50,000 Units total) by mouth every 7 (seven) days. 4 capsule 1  . ferrous sulfate 325 (65 FE) MG tablet Take 1 tablet (325 mg total) by mouth 2 (two) times daily with a meal. (Patient not taking: Reported on 05/01/2015) 60 tablet 0  . predniSONE (DELTASONE) 20 MG tablet Take 2 tablets (40 mg total) by mouth daily with breakfast. (Patient not taking: Reported on 05/01/2015) 10 tablet 0    Home: Home Living Family/patient expects to be discharged to:: Private residence Living Arrangements: Spouse/significant other Available Help at Discharge: Family, Available PRN/intermittently Type of Home: House Home Access: Stairs  to enter Entrance Stairs-Number of Steps: 3 Entrance Stairs-Rails: Left Home Layout: Two level, Able to live on main level with bedroom/bathroom Alternate Level Stairs-Number of Steps: flight Alternate Level Stairs-Rails: Right Bathroom Shower/Tub: Chiropodist: Standard Home Equipment: Environmental consultant - 2 wheels, Cane - single point, Bedside commode, Shower seat (pt indicates she also has a bed pan.)  Functional History: Prior Function Level of Independence: Independent with assistive device(s) Comments: occasionally uses AD  Functional Status:  Mobility: Bed Mobility Overal bed mobility: Needs Assistance Bed Mobility: Supine to Sit Supine to sit: Min assist, HOB elevated General bed mobility comments: pt continues to pull on PT's hand for bringing trunk up to sitting.   Transfers Overall transfer level: Needs assistance Equipment used: Rolling walker (2 wheeled) Transfers: Sit to/from Stand Sit to Stand: Min guard, +2 safety/equipment Stand pivot transfers: Min assist, +2 safety/equipment General  transfer comment: continues to need cueing for UE use, but only guarding A today.   Ambulation/Gait Ambulation/Gait assistance: Min guard, +2 safety/equipment Ambulation Distance (Feet): 150 Feet Assistive device: Rolling walker (2 wheeled) Gait Pattern/deviations: Step-through pattern, Decreased stride length General Gait Details: pt moving more slowly and pacing self today.  pt mobilizing for the first time on Trach collar and tolerating well.  O2 sats mid 90s on 3L/28% and all other VSS.   Gait velocity interpretation: Below normal speed for age/gender    ADL: ADL Overall ADL's : Needs assistance/impaired Eating/Feeding: NPO Grooming: Wash/dry hands, Wash/dry face, Set up, Bed level, Sitting Upper Body Bathing: Minimal assitance, Sitting, Bed level Lower Body Bathing: Moderate assistance, Sit to/from stand Upper Body Dressing : Moderate assistance, Sitting Lower Body Dressing: Moderate assistance, Sit to/from stand Toilet Transfer: Minimal assistance, +2 for safety/equipment, Stand-pivot, BSC Toileting- Clothing Manipulation and Hygiene: Moderate assistance, Sit to/from stand Functional mobility during ADLs: Minimal assistance, +2 for safety/equipment General ADL Comments: Pt requires assist due to lines and ETT   Cognition: Cognition Overall Cognitive Status: Difficult to assess Orientation Level: Oriented X4 Cognition Arousal/Alertness: Awake/alert Behavior During Therapy: WFL for tasks assessed/performed Overall Cognitive Status: Difficult to assess Difficult to assess due to: Tracheostomy   Blood pressure 130/77, pulse 83, temperature 97.8 F (36.6 C), temperature source Oral, resp. rate 24, height 5' (1.524 m), weight 115.2 kg (253 lb 15.5 oz), SpO2 99 %. Physical Exam  Nursing note and vitals reviewed. Constitutional: She is oriented to person, place, and time. She appears well-developed and well-nourished.  Obese female with cuffed trach in place. Copious thick  secretions on trach dressing.  Intermittent productive  cough noted.   HENT:  Head: Normocephalic.  Mouth/Throat: Oropharynx is clear and moist.  Eyes: Conjunctivae and EOM are normal. Pupils are equal, round, and reactive to light. Right eye exhibits no discharge. Left eye exhibits no discharge.  Neck: Decreased range of motion present. No tracheal deviation present.  Trach in place.   Cardiovascular: Normal rate and regular rhythm.   No murmur heard. Respiratory: Effort normal. No stridor. No respiratory distress. She has rhonchi. She has rales in the right lower field. She exhibits no tenderness.  +trach  GI: Soft. Bowel sounds are normal. She exhibits no distension. There is no tenderness.  +NG  Musculoskeletal: She exhibits no tenderness.  Well healed old bilateral TKR incisions.   Neurological: She is alert and oriented to person, place, and time.  No facial weakness.  Unable to phonate with trach briefly occluded.  Sensation intact to light touch Motor: Bilateral upper extremity 5/5 proximal distal  Bilateral lower extremity flexion and knee extension 4+/5, ankle dorsi/plantarflexion 5/5  Skin: Skin is warm and dry. No erythema.  Psychiatric: She has a normal mood and affect. Her behavior is normal. Thought content normal.    Results for orders placed or performed during the hospital encounter of 05/15/15 (from the past 24 hour(s))  Glucose, capillary     Status: Abnormal   Collection Time: 05/27/15  8:01 PM  Result Value Ref Range   Glucose-Capillary 107 (H) 65 - 99 mg/dL   Comment 1 Capillary Specimen   Glucose, capillary     Status: Abnormal   Collection Time: 05/27/15 11:56 PM  Result Value Ref Range   Glucose-Capillary 118 (H) 65 - 99 mg/dL   Comment 1 Capillary Specimen   Glucose, capillary     Status: Abnormal   Collection Time: 05/28/15  3:55 AM  Result Value Ref Range   Glucose-Capillary 119 (H) 65 - 99 mg/dL   Comment 1 Capillary Specimen   CBC with  Differential/Platelet     Status: Abnormal   Collection Time: 05/28/15  5:00 AM  Result Value Ref Range   WBC 15.8 (H) 4.0 - 10.5 K/uL   RBC 4.20 3.87 - 5.11 MIL/uL   Hemoglobin 10.5 (L) 12.0 - 15.0 g/dL   HCT 33.9 (L) 36.0 - 46.0 %   MCV 80.7 78.0 - 100.0 fL   MCH 25.0 (L) 26.0 - 34.0 pg   MCHC 31.0 30.0 - 36.0 g/dL   RDW 17.5 (H) 11.5 - 15.5 %   Platelets 387 150 - 400 K/uL   Neutrophils Relative % 85 %   Neutro Abs 13.5 (H) 1.7 - 7.7 K/uL   Lymphocytes Relative 12 %   Lymphs Abs 1.8 0.7 - 4.0 K/uL   Monocytes Relative 3 %   Monocytes Absolute 0.5 0.1 - 1.0 K/uL   Eosinophils Relative 0 %   Eosinophils Absolute 0.0 0.0 - 0.7 K/uL   Basophils Relative 0 %   Basophils Absolute 0.0 0.0 - 0.1 K/uL  Magnesium     Status: None   Collection Time: 05/28/15  5:00 AM  Result Value Ref Range   Magnesium 2.3 1.7 - 2.4 mg/dL  Renal function panel     Status: Abnormal   Collection Time: 05/28/15  5:00 AM  Result Value Ref Range   Sodium 143 135 - 145 mmol/L   Potassium 3.5 3.5 - 5.1 mmol/L   Chloride 106 101 - 111 mmol/L   CO2 29 22 - 32 mmol/L   Glucose, Bld 114 (H) 65 - 99 mg/dL   BUN 23 (H) 6 - 20 mg/dL   Creatinine, Ser 0.48 0.44 - 1.00 mg/dL   Calcium 8.9 8.9 - 10.3 mg/dL   Phosphorus 3.0 2.5 - 4.6 mg/dL   Albumin 2.7 (L) 3.5 - 5.0 g/dL   GFR calc non Af Amer >60 >60 mL/min   GFR calc Af Amer >60 >60 mL/min   Anion gap 8 5 - 15  Glucose, capillary     Status: Abnormal   Collection Time: 05/28/15  8:13 AM  Result Value Ref Range   Glucose-Capillary 105 (H) 65 - 99 mg/dL   Comment 1 Capillary Specimen    Dg Abd Portable 1v  05/26/2015  CLINICAL DATA:  Encounter for NG tube placement EXAM: PORTABLE ABDOMEN - 1 VIEW COMPARISON:  None. FINDINGS: The feeding tube extends into the stomach. The tip is within the fourth portion duodenum not changed from prior. IMPRESSION: No change in position  of feeding tube. Electronically Signed   By: Suzy Bouchard M.D.   On: 05/26/2015 15:31      Assessment/Plan: Diagnosis: MG exacerbation Labs and images independently reviewed.  Records reviewed and summated above.  1. Does the need for close, 24 hr/day medical supervision in concert with the patient's rehab needs make it unreasonable for this patient to be served in a less intensive setting? No  2. Co-Morbidities requiring supervision/potential complications: GERD (cont med), depression (ensure mood does not limit functional progress), Migraines (monitor and ensure pain does not limit gains in therapies), tachypnea (monitor RR and O2 Sats with increased physical exertion), C diff (consider treatment), ABLA (transfuse if necessary to ensure appropriate perfusion for increased activity tolerance), leukocytosis (cont to monitor for signs and symptoms of infection, further workup if indicated), dysphagia (cont to suction, cont SLP), +trach 3. Due to bowel management, safety, skin/wound care, disease management, pain management and patient education, does the patient require 24 hr/day rehab nursing? Yes 4. Does the patient require coordinated care of a physician, rehab nurse, PT (1-2 hrs/day, 5 days/week) and SLP (1-2 hrs/day, 5 days/week) to address physical and functional deficits in the context of the above medical diagnosis(es)? Potentially Addressing deficits in the following areas: endurance, locomotion, bathing, feeding, toileting and psychosocial support 5. Can the patient actively participate in an intensive therapy program of at least 3 hrs of therapy per day at least 5 days per week? Yes 6. The potential for patient to make measurable gains while on inpatient rehab is fair 7. Anticipated functional outcomes upon discharge from inpatient rehab are n/a  with PT, n/a with OT, n/a with SLP. 8. Estimated rehab length of stay to reach the above functional goals is: NA 9. Does the patient have adequate social supports and living environment to accommodate these discharge functional goals?  N/A 10. Anticipated D/C setting: Home 11. Anticipated post D/C treatments: HH therapy and Home excercise program 12. Overall Rehab/Functional Prognosis: good  RECOMMENDATIONS: This patient's condition is appropriate for continued rehabilitative care in the following setting: Patient relatively functioning. Would anticipate the patient would continue to make functional improvements once medical issues stabilize. Would recommend home with home health after this point. Patient has agreed to participate in recommended program. Potentially Note that insurance prior authorization may be required for reimbursement for recommended care.  Comment: Rehab Admissions Coordinator to follow up.  Delice Lesch, MD 05/28/2015

## 2015-05-28 NOTE — Progress Notes (Signed)
Nutrition Follow-up  DOCUMENTATION CODES:   Morbid obesity  INTERVENTION:    Change TF to Vital AF 1.2 at goal rate of 65 ml/h to provide 1560 ml, 1872 kcals, 117 gm protein, 1265 ml free water daily.  D/C Prostat.  NUTRITION DIAGNOSIS:   Inadequate oral intake related to inability to eat as evidenced by NPO status.  Ongoing  NEW GOAL:   Patient will meet greater than or equal to 90% of their needs  MONITOR:   Labs, TF tolerance, Diet advancement, I & O's  ASSESSMENT:   Pt presenting with myasthenia gravis exacerbation. PMH is significant for HTN,uterine fibroids, depression, anxiety, migraines and recently diagnosed myasthenia gravis, and undergoing work-up of splenomegaly with lymphadenopathy.  S/P tracheostomy on 3/22; has been on trach collar x 48 hours and is tolerating well, except a lot of secretions. SLP is working with patient for PMSV use. Remains NPO.   Patient is currently receiving Vital High Protein via Cortrak tube at 35 ml/h (840 ml/day) with Prostat 30 ml TID to provide 1140 kcals, 119 gm protein, 702 ml free water daily. Cortrak tube was repositioned on 3/25 with tip in the fourth portion of the duodenum. Patient tolerating TF without difficulty.   Diet Order:  Diet NPO time specified  Skin:  Reviewed, no issues  Last BM:  3/26  Height:   Ht Readings from Last 1 Encounters:  05/16/15 5' (1.524 m)    Weight:   Wt Readings from Last 1 Encounters:  05/28/15 253 lb 15.5 oz (115.2 kg)    Ideal Body Weight:  45.4 kg  BMI:  Body mass index is 49.6 kg/(m^2).  Estimated Nutritional Needs:   Kcal:  1700-2000  Protein:  115 gm  Fluid:  1.7-2 L  EDUCATION NEEDS:   No education needs identified at this time  Molli Barrows, [place], [place], [place] Pager [phone] After Hours Pager [phone]

## 2015-05-28 NOTE — Progress Notes (Signed)
Speech Language Pathology Treatment: Nada Boozer Speaking valve  Patient Details Name: VEDHA CATA MRN: [id number] DOB: 1960/09/28 Today's Date: 05/28/2015 Time: [id number] SLP Time Calculation (min) (ACUTE ONLY): 11 min  Assessment / Plan / Recommendation Clinical Impression  Pt alert and aware during PMSV tx. Continuous to have copious secretions; RN deep suctioned prior to valve placement. Pt able to expectorate secretions at tracheal and oral level with use of PMSV (SLP holding in place) to provide increased pressure. PMSV tolerated for period of 15 seconds with back pressure noted upon valve doff x2. Decreased redirection of air to upper airway and unable to phonate. Pt would benefit from a cuffless trach if able to continue on trach collar. Will contact MD/PA to discuss. Continue trials with SLP only.   HPI HPI: CIIN OBRYAN is a 55 y.o. female presenting with myasthenia gravis exacerbation. PMH is significant for HTN, uterine fibroids, depression, anxiety, migraines and recently diagnosed myasthenia gravis, and undergoing work-up of splenomegaly with lymphadenopathy.Intubated 3/15, trach placed 3/22.      SLP Plan  Continue with current plan of care     Recommendations         Patient may use Passy-Muir Speech Valve: with SLP only PMSV Supervision: Full MD: Please consider changing trach tube to : Cuffless      Oral Care Recommendations: Oral care BID Follow up Recommendations: Inpatient Rehab Plan: Continue with current plan of care     Malaisha Silliman, Student-SLP        Serria Sloma [facility] 05/28/2015, 3:14 PM

## 2015-05-28 NOTE — Progress Notes (Signed)
Physical Therapy Treatment Patient Details Name: Frances Medina MRN: [id number] DOB: [birth year] Today's Date: 05/28/2015    History of Present Illness pt presents with c/o difficulty swallowing for several days and SOB.  pt found to be in Myasthenia Gravis Flare up with recent diagnosis of Myasthenia Gravis in January and a flare up in February.  pt intubated on 3/15, extubated 3/18 with re-intubation on 3/18 and on vent for Eval. Attempted extubation 3/21 unsuccessfully and pt reintubated.  Trach placed on 3/22. Pt with hx of HTN, Depression, Anxiety, and Migraines.  pt currently undergoing work-up for Splenomegaly with Lymphadenopathy.    PT Comments    Patient progressing well with mobility. Tolerated increased ambulation distance on 28% FiO2 with Saturations >95% throughout. HR did elevate to 120s with fatigue. Will continue to see and progress as tolerated.  Follow Up Recommendations  Home health PT;Supervision - Intermittent     Equipment Recommendations  None recommended by PT    Recommendations for Other Services       Precautions / Restrictions Precautions Precautions: Fall Precaution Comments: Trach Collar Restrictions Weight Bearing Restrictions: No    Mobility  Bed Mobility Overal bed mobility: Needs Assistance Bed Mobility: Supine to Sit     Supine to sit: Min assist     General bed mobility comments: min assist for UE pull to sitting position (appears more of a comfort reaction rather then a necessity for assist)  Transfers Overall transfer level: Needs assistance Equipment used: Rolling walker (2 wheeled) Transfers: Sit to/from Stand Sit to Stand: Supervision         General transfer comment: Supervision to come to standing, normal effort, no physical assist required  Ambulation/Gait Ambulation/Gait assistance: Supervision Ambulation Distance (Feet): 380 Feet Assistive device: Rolling walker (2 wheeled) Gait Pattern/deviations: Step-through  pattern;Decreased stride length;Drifts right/left;Trunk flexed Gait velocity: decreased Gait velocity interpretation: Below normal speed for age/gender General Gait Details: patient mobilizing well. Tolerated increased distance on 28% FiO2. Patient with one standing rest break and overall decreased cadence. HR elevated to 120s with fatigue, saturations stable >96% throughout   Stairs            Wheelchair Mobility    Modified Rankin (Stroke Patients Only)       Balance     Sitting balance-Leahy Scale: Fair       Standing balance-Leahy Scale: Fair Standing balance comment: RW for UE support                    Cognition Arousal/Alertness: Awake/alert Behavior During Therapy: WFL for tasks assessed/performed Overall Cognitive Status: Difficult to assess                      Exercises Other Exercises Other Exercises: LE AROM Ankle pumps and LAQs prior to activity    General Comments        Pertinent Vitals/Pain Pain Assessment: No/denies pain    Home Living                      Prior Function            PT Goals (current goals can now be found in the care plan section) Acute Rehab PT Goals Patient Stated Goal: Back to normal. PT Goal Formulation: With patient Time For Goal Achievement: 06/04/15 Potential to Achieve Goals: Good Progress towards PT goals: Progressing toward goals    Frequency  Min 3X/week    PT Plan Current plan remains  appropriate    Co-evaluation             End of Session Equipment Utilized During Treatment: Gait belt;Oxygen Activity Tolerance: Patient tolerated treatment well Patient left: in chair;with call bell/phone within reach     Time: 1331-1357 PT Time Calculation (min) (ACUTE ONLY): 26 min  Charges:  $Gait Training: 8-22 mins $Therapeutic Activity: 8-22 mins                    G CodesDuncan Dull 06/24/15, 3:45 PM Alben Deeds, [place] DPT  [phone]

## 2015-05-29 DIAGNOSIS — J9601 Acute respiratory failure with hypoxia: Secondary | ICD-10-CM | POA: Insufficient documentation

## 2015-05-29 LAB — CBC WITH DIFFERENTIAL/PLATELET
Basophils Absolute: 0 10*3/uL (ref 0.0–0.1)
Basophils Relative: 0 %
Eosinophils Absolute: 0 10*3/uL (ref 0.0–0.7)
Eosinophils Relative: 0 %
HCT: 35.1 % — ABNORMAL LOW (ref 36.0–46.0)
Hemoglobin: 10.8 g/dL — ABNORMAL LOW (ref 12.0–15.0)
Lymphocytes Relative: 10 %
Lymphs Abs: 2 10*3/uL (ref 0.7–4.0)
MCH: 25.1 pg — ABNORMAL LOW (ref 26.0–34.0)
MCHC: 30.8 g/dL (ref 30.0–36.0)
MCV: 81.6 fL (ref 78.0–100.0)
Monocytes Absolute: 0.6 10*3/uL (ref 0.1–1.0)
Monocytes Relative: 3 %
Neutro Abs: 16.8 10*3/uL — ABNORMAL HIGH (ref 1.7–7.7)
Neutrophils Relative %: 87 %
Platelets: 403 10*3/uL — ABNORMAL HIGH (ref 150–400)
RBC: 4.3 MIL/uL (ref 3.87–5.11)
RDW: 17.7 % — ABNORMAL HIGH (ref 11.5–15.5)
WBC: 19.4 10*3/uL — ABNORMAL HIGH (ref 4.0–10.5)

## 2015-05-29 LAB — GLUCOSE, CAPILLARY
Glucose-Capillary: 114 mg/dL — ABNORMAL HIGH (ref 65–99)
Glucose-Capillary: 121 mg/dL — ABNORMAL HIGH (ref 65–99)
Glucose-Capillary: 124 mg/dL — ABNORMAL HIGH (ref 65–99)
Glucose-Capillary: 125 mg/dL — ABNORMAL HIGH (ref 65–99)
Glucose-Capillary: 128 mg/dL — ABNORMAL HIGH (ref 65–99)
Glucose-Capillary: 138 mg/dL — ABNORMAL HIGH (ref 65–99)

## 2015-05-29 LAB — RENAL FUNCTION PANEL
Albumin: 2.7 g/dL — ABNORMAL LOW (ref 3.5–5.0)
Anion gap: 9 (ref 5–15)
BUN: 23 mg/dL — ABNORMAL HIGH (ref 6–20)
CO2: 29 mmol/L (ref 22–32)
Calcium: 8.9 mg/dL (ref 8.9–10.3)
Chloride: 106 mmol/L (ref 101–111)
Creatinine, Ser: 0.45 mg/dL (ref 0.44–1.00)
GFR calc Af Amer: >60 mL/min (ref >60)
GFR calc non Af Amer: >60 mL/min (ref >60)
Glucose, Bld: 129 mg/dL — ABNORMAL HIGH (ref 65–99)
Phosphorus: 2.7 mg/dL (ref 2.5–4.6)
Potassium: 3.4 mmol/L — ABNORMAL LOW (ref 3.5–5.1)
Sodium: 144 mmol/L (ref 135–145)

## 2015-05-29 LAB — MAGNESIUM: Magnesium: 2.3 mg/dL (ref 1.7–2.4)

## 2015-05-29 MED ORDER — VANCOMYCIN HCL IN DEXTROSE 750-5 MG/150ML-% IV SOLN
750.0000 mg | Freq: Two times a day (BID) | INTRAVENOUS | Status: DC
  Administered 2015-05-30 – 2015-05-31 (×4): 750 mg via INTRAVENOUS
  Filled 2015-05-29 (×5): qty 150

## 2015-05-29 MED ORDER — SODIUM CHLORIDE 0.9 % IV SOLN
500.0000 mg | Freq: Four times a day (QID) | INTRAVENOUS | Status: AC
  Administered 2015-05-29 – 2015-06-05 (×30): 500 mg via INTRAVENOUS
  Filled 2015-05-29 (×31): qty 500

## 2015-05-29 MED ORDER — POTASSIUM CHLORIDE 20 MEQ/15ML (10%) PO SOLN
40.0000 meq | Freq: Two times a day (BID) | ORAL | Status: AC
  Administered 2015-05-29 (×2): 40 meq
  Filled 2015-05-29 (×2): qty 30

## 2015-05-29 MED ORDER — VANCOMYCIN HCL 10 G IV SOLR
2000.0000 mg | Freq: Once | INTRAVENOUS | Status: AC
  Administered 2015-05-29: 2000 mg via INTRAVENOUS
  Filled 2015-05-29: qty 2000

## 2015-05-29 MED ORDER — POTASSIUM CHLORIDE 20 MEQ PO PACK: 40.0000 meq | PACK | Freq: Two times a day (BID) | ORAL | Status: DC

## 2015-05-29 NOTE — Progress Notes (Signed)
Rehab admissions - Please see rehab consult done by Dr. Posey Pronto yesterday recommending [facility] therapies.  Noted PT now also recommending HH.  Patient able to ambulate 380 feet at supervision level.  Recommend discharge home with [facility] therapies once medically ready.  Call me for questions.  [phone]

## 2015-05-29 NOTE — Progress Notes (Signed)
Physical Therapy Treatment Patient Details Name: Frances Medina MRN: [id number] DOB: 07-Apr-1960 Today's Date: 05/29/2015    History of Present Illness pt presents with c/o difficulty swallowing for several days and SOB.  pt found to be in Myasthenia Gravis Flare up with recent diagnosis of Myasthenia Gravis in January and a flare up in February.  pt intubated on 3/15, extubated 3/18 with re-intubation on 3/18 and on vent for Eval. Attempted extubation 3/21 unsuccessfully and pt reintubated.  Trach placed on 3/22. Pt with hx of HTN, Depression, Anxiety, and Migraines.  pt currently undergoing work-up for Splenomegaly with Lymphadenopathy.    PT Comments    Patient ambulated around the entire unit today on 28% FiO2. Patient also performed in room ambulation without device and was able to push her own O2 tank in the room. Patient progressing very well, will reassess home needs next session but may not need PT follow up.   Follow Up Recommendations  Home health PT;Supervision - Intermittent     Equipment Recommendations  None recommended by PT    Recommendations for Other Services       Precautions / Restrictions Precautions Precautions: Fall Precaution Comments: Trach Collar Restrictions Weight Bearing Restrictions: No    Mobility  Bed Mobility   Bed Mobility: Supine to Sit     Supine to sit: Supervision;HOB elevated     General bed mobility comments: no physical assist, HOB up  Transfers Overall transfer level: Needs assistance Equipment used: Rolling walker (2 wheeled) Transfers: Sit to/from Stand Sit to Stand: Supervision            Ambulation/Gait Ambulation/Gait assistance: Supervision Ambulation Distance (Feet): 540 Feet Assistive device: Rolling walker (2 wheeled);None Gait Pattern/deviations: Step-through pattern;Decreased stride length Gait velocity: decreased Gait velocity interpretation: Below normal speed for age/gender General Gait Details:  ambulated the entire unit without difficulty, one short standing rest break as HR elevated to 130s. returned to room and ambulated 20 ft in room without assistive device and carrying her own O2 tank into and out of the bathroom.   Stairs            Wheelchair Mobility    Modified Rankin (Stroke Patients Only)       Balance     Sitting balance-Leahy Scale: Fair       Standing balance-Leahy Scale: Fair Standing balance comment: no UE support needed                    Cognition Arousal/Alertness: Awake/alert Behavior During Therapy: WFL for tasks assessed/performed Overall Cognitive Status: Difficult to assess                      Exercises      General Comments        Pertinent Vitals/Pain Pain Assessment: No/denies pain    Home Living                      Prior Function            PT Goals (current goals can now be found in the care plan section) Acute Rehab PT Goals Patient Stated Goal: Back to normal. PT Goal Formulation: With patient Time For Goal Achievement: 06/04/15 Potential to Achieve Goals: Good Progress towards PT goals: Progressing toward goals    Frequency  Min 3X/week    PT Plan Current plan remains appropriate    Co-evaluation PT/OT/SLP Co-Evaluation/Treatment:  (Dove tailed session after OT)  End of Session Equipment Utilized During Treatment: Gait belt;Oxygen Activity Tolerance: Patient tolerated treatment well Patient left: in chair;with call bell/phone within reach     Time: 1145-1206 PT Time Calculation (min) (ACUTE ONLY): 21 min  Charges:  $Gait Training: 8-22 mins                    G CodesDuncan Dull 06/18/2015, 1:13 PM Alben Deeds, [place] DPT  [phone]

## 2015-05-29 NOTE — Progress Notes (Signed)
Pharmacy Antibiotic Note  Frances Medina is a 55 y.o. female admitted on 05/15/2015 with pneumonia.  Pharmacy has been consulted for vancomycin and primaxin dosing.  55 y.o. female presenting to ED on 3/14 with increased weakness, SOB, and dysphagia over the last 7 d PTA.   Patient has been having a lot of trach secretions, no fevers noted, wbc trending up to 19.4. Scr normal.  Noted history of pcn, low likelyhood of carbapenem crossover, she has tolerated cephalosporins in the past.  Plan: Vancomycin 2g IV now then 750mg  IV q12 hours Primaxin 500 q6 hours Follow up culture data  Height: 5' (152.4 cm) Weight: 253 lb 12 oz (115.1 kg) IBW/kg (Calculated) : 45.5  Temp (24hrs), Avg:97.8 F (36.6 C), Min:97.2 F (36.2 C), Max:98.7 F (37.1 C)   Recent Labs Lab 05/25/15 0605 05/26/15 0500 05/27/15 0435 05/28/15 0500 05/29/15 0310  WBC 11.2* 12.9* 16.3* 15.8* 19.4*  CREATININE 0.61 0.51 0.50 0.48 0.45    Estimated Creatinine Clearance: 93 mL/min (by C-G formula based on Cr of 0.45).    Allergies  Allergen Reactions  . Dicyclomine Hives  . Methocarbamol Hives  . Penicillins Hives    Has patient had a PCN reaction causing immediate rash, facial/tongue/throat swelling, SOB or lightheadedness with hypotension: Yes Has patient had a PCN reaction causing severe rash involving mucus membranes or skin necrosis: No Has patient had a PCN reaction that required hospitalization No Has patient had a PCN reaction occurring within the last 10 years: No If all of the above answers are "NO", then may proceed with Cephalosporin use.   . Ciprofloxacin     Rash, shortness of breath  . Shrimp [Shellfish Allergy]     Antimicrobials this admission: Septra 3/18>>3/20 Vanc 3/28>> Primaxin 3/28>>  Dose adjustments this admission:   Microbiology results: 3/28 BCx: sent 3/14 UCx: ecoli  3/15 MRSA PCR: neg  Thank you for allowing pharmacy to be a part of this patient's care.  Erin Hearing PharmD., BCPS Clinical Pharmacist Pager [phone] 05/29/2015 2:24 PM

## 2015-05-29 NOTE — Progress Notes (Signed)
PULMONARY / CRITICAL CARE MEDICINE   Name: Frances Medina MRN: [id number] DOB: 1960/10/03    ADMISSION DATE:  05/15/2015 CONSULTATION DATE: 05/16/2015  REFERRING MD:   CHIEF COMPLAINT: Weakness, dysphagia, SOB in setting of known MG   HISTORY OF PRESENT ILLNESS:  55 y.o. female presenting to ED on 3/14 with increased weakness, SOB, and dysphagia over the last 7 d PTA.. Of note, she was recently diagnosed with myasthenia gravis during hospitalization in January 2017, during which time she required treatment with IVIG and was started on pyridostigmine. She had another hospitalization for MG flare in February, also requiring treatment with IVIG and steroid taper, thought to have been triggered by a UTI. She was supposed to have continued prednisone upon last hospital discharge 04/13/15, but patient was not aware that she had a prescription. She called her neurologist Dr. Audelia Acton last Friday about her dysphagia, and he recommended increasing frequency of pyridostigmine 60 mg to q6h from q8h. He also noted that patient would need to start steroids or be seen in the ED. Patient had an appointment to see him 05/16/15 with plans to start prednisone, but with increasing fatigue, she decided to be evaluated in the ED. Swallowing has become more difficult since last Friday, 05/11/15. She was only able to eat "a few nuggets" today. She was, however, able to take all but her evening dose of pyridostigmine. She has been able to drink fluids but not as much as usual. She and her husband report that she has trouble with her speech if she talks too much. She also complains of chest pain under her breast that feels like "it's swollen" and cuts off her breath since yesterday. She has had rhinorrhea with post-nasal drip and cough in the middle of the night since last weak but has not taken any new OTC medications. No sick contacts.In the ED on 3/14, patient's vital signs were stable, and SpO2 was 99% on room air. She was to be  admitted to the ICU. Since presentation her swallowing fxn declined further w/ difficulty swallowing oral secretions. A NIF was recorded at -25, and her VC was 561ml. PCCM was consulted given concern for impending respiratory failure.   SUBJECTIVE: Trache collar x> 48 hrs >> comfortable. Pt has a lot of trache secretions -- requiring frequent suctioning.less secretions today.  No other issues, Diarrhea is better. (-) abd pain. On TF. Walking around.    REVIEW OF SYSTEMS: Patient denies any nausea or vomiting as well as headache with head nod. Has a lot of trache secretions.   VITAL SIGNS: BP 101/60 mmHg  Pulse 111  Temp(Src) 98.7 F (37.1 C) (Oral)  Resp 18  Ht 5' (1.524 m)  Wt 253 lb 12 oz (115.1 kg)  BMI 49.56 kg/m2  SpO2 99%  HEMODYNAMICS:    VENTILATOR SETTINGS: Vent Mode:  [-]  FiO2 (%):  [28 %] 28 %  INTAKE / OUTPUT: I/O last 3 completed shifts: In: 1960.6 [NG/GT:1960.6] Out: 2 [Urine:1; Stool:1]  PHYSICAL EXAMINATION: General:  Awake. Alert. On tracheostomy collar. Integument:  Warm & dry. No rash on exposed skin.  HEENT:  No scleral injection. Tracheostomy in place. Moist mucus membranes. Yellow trache secretions Cardiovascular:  Regular rate. No edema. No appreciable JVD.  Pulmonary:  Clear bilaterally to auscultation. Normal work of breathing on tracheostomy collar. Abdomen: Soft. Normal bowel sounds. Nondistended.  Neurological:  Follows commands. Moving all 4 extremities equally. Grossly nonfocal.  LABS:  BMET  Recent Labs Lab 05/27/15 0435 05/28/15 0500  05/29/15 0310  NA 146* 143 144  K 3.2* 3.5 3.4*  CL 109 106 106  CO2 30 29 29   BUN 22* 23* 23*  CREATININE 0.50 0.48 0.45  GLUCOSE 123* 114* 129*    Electrolytes  Recent Labs Lab 05/27/15 0435 05/28/15 0500 05/29/15 0310  CALCIUM 8.8* 8.9 8.9  MG 2.4 2.3 2.3  PHOS 3.0 3.0 2.7    CBC  Recent Labs Lab 05/27/15 0435 05/28/15 0500 05/29/15 0310  WBC 16.3* 15.8* 19.4*  HGB 10.1*  10.5* 10.8*  HCT 33.2* 33.9* 35.1*  PLT 383 387 403*    Coag's  Recent Labs Lab 05/23/15 0600  APTT 32  INR 1.19    Sepsis Markers No results for input(s): LATICACIDVEN, PROCALCITON, O2SATVEN in the last 168 hours.  ABG  Recent Labs Lab 05/22/15 1220  PHART 7.329*  PCO2ART 64.4*  PO2ART 197*    Liver Enzymes  Recent Labs Lab 05/27/15 0435 05/28/15 0500 05/29/15 0310  ALBUMIN 2.5* 2.7* 2.7*    Cardiac Enzymes No results for input(s): TROPONINI, PROBNP in the last 168 hours.  Glucose  Recent Labs Lab 05/28/15 1150 05/28/15 1629 05/28/15 1951 05/29/15 0036 05/29/15 0419 05/29/15 0838  GLUCAP 112* 130* 115* 128* 138* 121*    Imaging No results found.   STUDIES:  CXR 3/14 >> Hypoinflation of the lungs with mild bibasilar subsegmental atelectasis  Abd X-ray 3/21: Feeding tube with tip in distal duodenum. Port CXR 3/22: Right internal jugular central venous catheter in good position. Endotracheal tube in good position. Low lung volumes. Minimal basilar atelectasis. Port CXR 3/22:  Trach in place w/ tip approximately 2.5cm above carina. No new opacity. R IJ CVL in place. Bilateral effusions right greater than left.  MICROBIOLOGY: Urine Ctx 3/14:  E coli MRSA Nasal Ctx 3/15:  Negative   ANTIBIOTICS: Bactrim 3/18 - 3/20   SIGNIFICANT EVENTS: 2/10 >> D/C from hospital  3/13 >> Onset of severe SOB and weakness  3/14 >> Present to ED  3/15 >> IVIG and ETT  3/18 >> Unsuccessful extubation , reintubated.  3/21 >> Unsuccessful extubation, reintubated 3/22 >> Trach placed by JY  LINES/TUBES: Shiley #6 Cuffed (Placed by Hyman Bible) 3/22>> R IJ CVL 3/15 >>  L Nare NGT 3/21 - 3/25; 3/25>>> OETT 7.0 3/15 - 3/18; 3/18 - 3/21; 3/21 - 3/22  ASSESSMENT / PLAN:  PULMONARY A: Acute Respiratory Failure due to flare of MG - Reintubated twice for stridor/ cord edema. s/p trach. 93/22)  P:   Continue T-collar as long as tolerated. Has a 6 cuffed trache. Recent  rache >> will hold off on trache change. Still with secretions issue but better.  Vent available prn Continue PMV trials  CARDIOVASCULAR A:  H/O HTN - On no home medications. Normotensive here.   P:  Monitor on telemetry Vitals per unit protocol  RENAL A:   No issues.   P:   Daily weights Trending renal function & electrolytes daily  GASTROINTESTINAL A:   Dysphagia  H/O GERD  Diarrhea >> better. C diff toxin (-) but Ag (+)   P:  Continue Tube Feedings Pepcid VT bid Speech Eval as appropriate May need a PEG if we can not downsize trache and with inc trache secretions Hold off on Rx Cdiff Ag.   HEMATOLOGIC A:   Anemia - Mild & Hgb stable. No signs of active bleeding. Leukocytosis - P:  Trending cell counts daily w/ CBC Transfuse for Hgb <7.0 SCDs Heparin [place] q8hr  INFECTIOUS A:  E coli UTI - S/P tx.  P:   Avoiding Foley Monitor for fever & leukocytosis  ENDOCRINE A:   Hyperglycemia - BG stable.  P:   D/C Accu-checks Monitor BG on daily labs  NEUROLOGIC A:   Myasthenia Gravis Exacerbation - Complete 5 days IVIG (3/19). Solu-Medrol stopped 3/22. Pain Post Perc Trach  P:   Neurology following peripherally Continue mestinon 60 q 6 Fentanyl IV prn  Zoloft home dose Klonopin 0.5mg  bid VT -home meds Ambulating with PT  FAMILY   - Updates: updated at bedside.   TODAY'S SUMMARY:  Myasthenic crisis appears to be resolved. Continuing tracheostomy collar trials today. Pt is too well to go to LTAC or inpt rehab. Case manager talking to family re: home with services or snf.    Monica Becton, MD 05/29/2015, 12:08 PM [facility] Pager [phone] After 3 pm or if no answer, call [phone]

## 2015-05-29 NOTE — Progress Notes (Signed)
Occupational Therapy Treatment Patient Details Name: Frances Medina MRN: [id number] DOB: May 12, 1960 Today's Date: 05/29/2015    History of present illness pt presents with c/o difficulty swallowing for several days and SOB.  pt found to be in Myasthenia Gravis Flare up with recent diagnosis of Myasthenia Gravis in January and a flare up in February.  pt intubated on 3/15, extubated 3/18 with re-intubation on 3/18 and on vent for Eval. Attempted extubation 3/21 unsuccessfully and pt reintubated.  Trach placed on 3/22. Pt with hx of HTN, Depression, Anxiety, and Migraines.  pt currently undergoing work-up for Splenomegaly with Lymphadenopathy.   OT comments  Pt demonstrated ability to perform toileting in bathroom and grooming at sink with supervision, primarily for lines. Ambulated without AD to/from bathroom. Pt reports difficulty with LB bathing and dressing, plan to educate in use of AE next visit.   Follow Up Recommendations  No OT follow up;Supervision/Assistance - 24 hour    Equipment Recommendations  None recommended by OT    Recommendations for Other Services      Precautions / Restrictions Precautions Precautions: Fall Precaution Comments: Trach Collar       Mobility Bed Mobility   Bed Mobility: Supine to Sit     Supine to sit: Supervision;HOB elevated     General bed mobility comments: no physical assist, HOB up  Transfers Overall transfer level: Needs assistance Equipment used: Rolling walker (2 wheeled) Transfers: Sit to/from Stand Sit to Stand: Supervision              Balance                                   ADL Overall ADL's : Needs assistance/impaired Eating/Feeding: NPO   Grooming: Wash/dry hands;Standing;Supervision/safety           Upper Body Dressing : Set up;Sitting       Toilet Transfer: Supervision/safety;Ambulation   Toileting- Clothing Manipulation and Hygiene: Supervision/safety;Sit to/from stand        Functional mobility during ADLs: Supervision/safety;Rolling walker (no walker used in bathroom) General ADL Comments: Assist for line/02 tank only.      Vision                     Perception     Praxis      Cognition   Behavior During Therapy: WFL for tasks assessed/performed Overall Cognitive Status: Difficult to assess                       Extremity/Trunk Assessment               Exercises     Shoulder Instructions       General Comments      Pertinent Vitals/ Pain       Pain Assessment: No/denies pain  Home Living                                          Prior Functioning/Environment              Frequency Min 2X/week     Progress Toward Goals  OT Goals(current goals can now be found in the care plan section)  Progress towards OT goals: Progressing toward goals  Acute Rehab OT Goals Patient Stated Goal: Back to normal.  Plan Discharge plan remains appropriate    Co-evaluation                 End of Session Equipment Utilized During Treatment: Oxygen   Activity Tolerance Patient tolerated treatment well   Patient Left in chair;with call bell/phone within reach;with family/visitor present   Nurse Communication          Time: [id number] OT Time Calculation (min): 15 min  Charges: OT General Charges $OT Visit: 1 Procedure OT Treatments $Self Care/Home Management : 8-22 mins  Malka So 05/29/2015, 12:36 PM  [phone]

## 2015-05-29 NOTE — Progress Notes (Addendum)
Speech Language Pathology Treatment: Frances Medina Speaking valve  Patient Details Name: Frances Medina MRN: [id number] DOB: 12-27-1960 Today's Date: 05/29/2015 Time: [id number] SLP Time Calculation (min) (ACUTE ONLY): 10 min  Assessment / Plan / Recommendation Clinical Impression  Pt seen today for continued PMSV trials. Per RN, pt secretions are less thick, pt is requiring less suctioning, and has demonstrated increased success with coughing up secretions. Pt tolerating cuff deflation at baseline. Awake, willing to try PMSV placement. After 10 seconds on first try, and 15 seconds on second try, pt waved hands to request having valve removed. O2 sats were maintained during brief trial, however, pt exhibits decreased redirection of air through upper airway, and was unable to produce voicing again today. Air trapping noted upon removal of valve both times. Pt declined additional attempts, indicating she was sleepy. RN indicated pt had had a busy day with a long walk during PT.    Discussed changing trach to cuffless trach wtih RN, who indicated MD and NP are aware, and will make changes when appropriate. ST will continue to follow pt for PMSV trials, and education regarding care and use.   HPI HPI: Frances Medina is a 55 y.o. female presenting with myasthenia gravis exacerbation. PMH is significant for HTN, uterine fibroids, depression, anxiety, migraines and recently diagnosed myasthenia gravis, and undergoing work-up of splenomegaly with lymphadenopathy.Intubated 3/15, trach placed 3/22.      SLP Plan  Continue with current plan of care     Recommendations  Diet recommendations: NPO Medication Administration: Via alternative means      Patient may use Passy-Muir Speech Valve: with SLP only PMSV Supervision: Full MD: Please consider changing trach tube to : Cuffless      Oral Care Recommendations: Oral care BID Follow up Recommendations: Inpatient Rehab Plan: Continue with current plan of  care     Frances Medina 05/29/2015, 4:09 PM  Frances Medina B. Frances Medina [place], Frances Medina [phone]

## 2015-05-29 NOTE — Progress Notes (Signed)
Pt transf to 2h on 3-26. Was being eval by cir. Pt did so well w phy there that not cir-inpt rehab cnadidate.  Met w pt and son this am. Pt has 3 children and husband. All family work. Pt still has feeding tube. Explained that cir state she's doing too well for that program. Explained phy there rec hhpt and if she want snf before home that maybe option. Her ins is [facility]bs and they would have to agree to snf if she wanted that. Await pt to be able to eat or peg if not able to eat then we can finalize dc plans. Pt had wanted ltac or cir but doing well at present. Will cont to follow for hhc vs snf dep on needs.

## 2015-05-29 NOTE — Progress Notes (Signed)
Family Medicine Teaching Service Daily Progress Note Intern Pager: [phone]  Patient name: Frances Medina Medical record number: [id number] Date of birth: 16-Feb-1961 Age: 55 y.o. Gender: female  Primary Care Provider: Andrena Mews, MD Consultants: Pulmonology, Neurology Code Status: Full  Pt Overview and Major Events to Date:  2/10 >> D/C from hospital  3/13 >> Onset of severe SOB and weakness  3/14 >> Presented to ED  3/15 >> Transferred to Neuro ICU, IVIG and ETT  3/18 >> Unsuccessful extubation , reintubated.  3/19 >> Completed 5 days IVIG 3/21 >> Unsuccessful extubation, reintubated 3/22 >> Trach placed, solu-medrol 3/22  3/27 >> Neurology signed off 3/28 >> Begin IV vancomycin and imipenem  Assessment and Plan: Frances Medina is a 55 y.o. female presenting with myasthenia gravis exacerbation. PMH is significant for HTN,uterine fibroids, depression, anxiety, migraines and recently diagnosed myasthenia gravis, and undergoing work-up of splenomegaly with lymphadenopathy.   Myasthenia Gravis Exacerbation: Unclear trigger of symptoms, though discontinuation of steroids is most likely cause. Rhinorrhea but no fevers or leukocytosis. UA was positive for nitrites and trace leukocytes. Initial plan was to admit to SDU on continuous cardiopulmonary monitoring. However, initial NIF score was -25, so patient was transferred to ICU and intubated. Follows with Neurologist Dr. Audelia Acton of [facility]. She has tolerated trach collar for > 72 hours now.  - Neurology consulting, appreciate recommendations: s/p IV solumedrol and 5 days IVIG; did not recommend further steroids - Pulmonology following respiratory secretions and hopeful to downsize trach, otherwise feeding tube may be needed in next couple of days; anticipate discharge to [facility] because of respiratory secretions - s/p perc trach on 28% FiO2; appreciate pulmonology recommendations - Tube feedings - Pyridostigmine 60 mg qid  (increased from TID prior to admission) - PT/OT consulted --> HH PT, intermittent supervision - Continue home prn albuterol for wheezing/SOB - Drugs to avoid in MG: aminoglycosides, clindamycin, fluoroquinolones, ketolides, vancomycin, beta blockers, procainamide, quinidine, anti-PD-1 monoclonal antibodies, botox, chloroquine, hydroxychloroquine, magnesium, penicillamine, quinine  Vital sign instability: Patient with tachycardia and softer blood pressures today, concerning for infection, though afebrile. Will treat as VAP, given changes in vital signs and increased respiratory secretions. CXR 3/27 worse compared to 3/22 with decreased lung volume, possibly worsened bilateral pleural effusions and opacities concerning for infection vs. atelectasis.  - Obtain tracheal aspirate - Obtain blood cultures - Then begin IV vancomycin and imipenem (given cipro allergy)  Diarrhea: x 3 days - Improving - C. Diff panel resulted as antibody positive, toxin negative, so will not start flagyl  HFpEF: ECHO 04/09/15 performed due to SOB showed EF of 60-65% and G1DD. Patient currently appears euvolemic. - Monitor fluid status as administer MIVFs while NPO.   Splenomegaly w/Lymphadenopathy: Being followed by Oncologist Dr. Irene Limbo. Work-up thus far has shown HIV neg, mono screen neg, elevated LDH at 395 and low haptoglobin at <10 (with recent IVIG treatment). To repeat hemolytic markers, CBC and Coombs' test later this month at follow-up. No hypermetabolic masses or lymphadenopathy seen on PET scan 05/14/15. - Continue to monitor CBCs - Consider pursuing autoimmune work-up while hospitalized   Hx of HTN: 93/58-144/97 yesterday. BPs on the softer side overnight, raising concern for infection like PNA with increased secretions.  - Not on any home anti-hypertensives - Continue to monitor. Will add prn if needed.   Depression and Anxiety: - Zoloft 50 mg daily daily - Klonopin 0.5 mg BID  E. Coli UTI: s/p 3 days  bactrim  FEN/GI: Tube feeds, pepcid Prophylaxis: Heparin  Disposition: Remain in SDU for increased respiratory secretions; plan to decrease size of tracheostomy tube if secretions improve, if not may need feeding tube; anticipate dispo to in-hospital LTAC  Subjective:  Patient was able to cough up a lot of her respiratory secretions overnight. Diarrhea is slowing down.   Objective: Temp:  [97.2 F (36.2 C)-98.1 F (36.7 C)] 97.5 F (36.4 C) (03/28 0401) Pulse Rate:  [66-102] 67 (03/28 0401) Resp:  [17-28] 19 (03/28 0401) BP: (93-144)/(39-97) 99/39 mmHg (03/28 0401) SpO2:  [95 %-99 %] 96 % (03/28 0401) FiO2 (%):  [28 %] 28 % (03/28 0401) Weight:  [253 lb 12 oz (115.1 kg)] 253 lb 12 oz (115.1 kg) (03/28 0401) Physical Exam: General: Obese female, resting in bed awake, able to sit up with ease for exam  Cardiovascular: RRR, S1, S2, no m/r/g Chest: Lungs CTAB with decreased breath sounds at bases, trach collar in place Abdomen: +BS, soft, NT, ND Extremities: Moves all spontaneously, Strength 5-/5 of LE and 5/5 UEs  Laboratory:  Recent Labs Lab 05/27/15 0435 05/28/15 0500 05/29/15 0310  WBC 16.3* 15.8* 19.4*  HGB 10.1* 10.5* 10.8*  HCT 33.2* 33.9* 35.1*  PLT 383 387 403*    Recent Labs Lab 05/27/15 0435 05/28/15 0500 05/29/15 0310  NA 146* 143 144  K 3.2* 3.5 3.4*  CL 109 106 106  CO2 '30 29 29  ' BUN 22* 23* 23*  CREATININE 0.50 0.48 0.45  CALCIUM 8.8* 8.9 8.9  GLUCOSE 123* 114* 129*    Imaging/Diagnostic Tests: Dg Chest 1 View  05/28/2015  CLINICAL DATA:  Patient with respiratory failure. Shortness of breath. EXAM: CHEST 1 VIEW COMPARISON:  Chest radiograph 05/23/2015 FINDINGS: Tracheostomy tube terminates in the mid trachea. Enteric tube courses inferior to the diaphragm. Right upper extremity PICC line is present with tip projecting over the right atrium. Low lung volumes. Stable cardiac and mediastinal contours. Small bilateral pleural effusions. Bibasilar  airspace opacities. No pneumothorax. IMPRESSION: Right upper extremity PICC line is present with tip projecting over the right atrium. Low lung volumes, small bilateral pleural effusions and underlying opacities favored to represent atelectasis or infection. Associated interstitial pulmonary edema. Electronically Signed   By: Lovey Newcomer M.D.   On: 05/28/2015 13:06   Dg Chest 2 View  05/15/2015  CLINICAL DATA:  Shortness of breath. EXAM: CHEST  2 VIEW COMPARISON:  April 07, 2015. FINDINGS: The heart size and mediastinal contours are within normal limits. Hypoinflation of the lungs is noted. Mild bibasilar subsegmental atelectasis is noted. No pneumothorax or pleural effusion is noted. The visualized skeletal structures are unremarkable. IMPRESSION: Hypoinflation of the lungs with mild bibasilar subsegmental atelectasis. Electronically Signed   By: Marijo Conception, M.D.   On: 05/15/2015 21:06   Nm Pet Image Initial (pi) Skull Base To Thigh  05/14/2015  CLINICAL DATA:  Initial treatment strategy for lymphadenopathy and splenomegaly. Possibly lymphoproliferative disease. Myasthenia gravis. EXAM: NUCLEAR MEDICINE PET SKULL BASE TO THIGH TECHNIQUE: 14.8 mCi F-18 FDG was injected intravenously. Full-ring PET imaging was performed from the skull base to thigh after the radiotracer. CT data was obtained and used for attenuation correction and anatomic localization. FASTING BLOOD GLUCOSE:  Value: 97 mg/dl COMPARISON:  Chest CTA on 04/07/2015 FINDINGS: NECK No hypermetabolic lymph nodes in the neck. CHEST No hypermetabolic mediastinal or hilar nodes. No suspicious pulmonary nodules on the CT scan. ABDOMEN/PELVIS No abnormal hypermetabolic activity within the liver, pancreas, adrenal glands, or spleen. No evidence of splenomegaly. No hypermetabolic lymph nodes in  the abdomen or pelvis. SKELETON No focal hypermetabolic activity to suggest skeletal metastasis. Mild diffusely increased hypermetabolic activity seen  throughout the axial and appendicular bone marrow. IMPRESSION: No hypermetabolic masses or lymphadenopathy identified. Mild diffusely increased metabolic activity throughout the bone marrow, without focal metastatic lesions. This finding is of uncertain clinical significance, and is likely due to marrow stimulation. Bone marrow biopsy could be considered further evaluation if clinically warranted. Electronically Signed   By: Earle Gell M.D.   On: 05/14/2015 16:33   Dg Chest Port 1 View  05/23/2015  CLINICAL DATA:  Tracheostomy placement. EXAM: PORTABLE CHEST 1 VIEW COMPARISON:  Study obtained earlier in the day FINDINGS: There is now a tracheostomy present with the tracheostomy catheter tip 2.4 cm above the carina. Feeding tube tip is below the diaphragm. Central catheter tip is at the cavoatrial junction. No pneumothorax. There are bilateral pleural effusions, larger on the right than on the left. There is atelectatic change in the bases. Heart is mildly enlarged. The pulmonary vascularity appears normal. No adenopathy. No bone lesions. IMPRESSION: Tube and catheter positions as described without pneumothorax. Mild cardiomegaly with bilateral pleural effusions. Suspect a degree of congestive heart failure. There is bibasilar atelectasis. Electronically Signed   By: Lowella Grip III M.D.   On: 05/23/2015 16:17   Portable Chest Xray  05/23/2015  CLINICAL DATA:  55 year old female female with respiratory failure. Myasthenia gravis. Initial encounter. EXAM: PORTABLE CHEST 1 VIEW COMPARISON:  05/22/2015 and earlier. FINDINGS: Portable AP semi upright view at 0604 hours. Stable endotracheal tube tip at the level the clavicles. Enteric tube now in place, courses to the abdomen and tip not included. Multiple EKG leads and wires also overlie the chest. Stable right IJ central line. Continued low lung volumes with right greater than left basilar veiling opacity. No pneumothorax. No acute pulmonary edema.  Mediastinal contours remain within normal limits. IMPRESSION: 1. Enteric tube placed and courses to the abdomen, tip not included. Otherwise, stable lines and tubes. 2. Stable ventilation. Low lung volumes with bibasilar opacity probably reflecting atelectasis and small effusions. Electronically Signed   By: Genevie Ann M.D.   On: 05/23/2015 07:36   Portable Chest Xray  05/22/2015  CLINICAL DATA:  Re-intubation today EXAM: PORTABLE CHEST 1 VIEW COMPARISON:  05/22/2015 FINDINGS: Unchanged position of endotracheal tube and internal jugular central line. Cardiac enlargement stable. Bibasilar hypoventilatory change with low lung volumes. IMPRESSION: Bilateral lower lobe atelectasis. Findings similar to prior study, with mildly increased opacity at the bases. Electronically Signed   By: Skipper Cliche M.D.   On: 05/22/2015 12:14   Dg Chest Port 1 View  05/22/2015  CLINICAL DATA:  Acute respiratory failure EXAM: PORTABLE CHEST 1 VIEW COMPARISON:  05/21/15 FINDINGS: Cardiomediastinal silhouette is stable. Endotracheal tube in place with tip 2.5 cm above the carina. NG tube in place. Right IJ central line is unchanged in position. Central mild vascular congestion without convincing pulmonary edema persistent mild basilar atelectasis. No segmental infiltrate. IMPRESSION: Stable support apparatus. Central mild vascular congestion without convincing pulmonary edema. Persistent bilateral basilar atelectasis. Electronically Signed   By: Lahoma Crocker M.D.   On: 05/22/2015 07:50   Dg Chest Port 1 View  05/21/2015  CLINICAL DATA:  Acute respiratory failure EXAM: PORTABLE CHEST 1 VIEW COMPARISON:  05/19/2015 FINDINGS: Endotracheal tube terminates 5 cm above the carina. Mild bibasilar opacities, likely atelectasis. No frank interstitial edema. No pleural effusion or pneumothorax. The heart is normal in size. Right IJ venous catheter terminates the  cavoatrial junction. Enteric tube courses into the mid stomach. IMPRESSION:  Endotracheal tube terminates 5 cm above the carina. Mild bibasilar opacities, likely atelectasis. No frank interstitial edema. Electronically Signed   By: Julian Hy M.D.   On: 05/21/2015 10:45   Dg Chest Port 1 View  05/19/2015  CLINICAL DATA:  Hypoxia EXAM: PORTABLE CHEST 1 VIEW COMPARISON:  May 19, 2015 study obtained earlier in the day FINDINGS: Endotracheal tube tip is 9 mm above the carina. Central catheter tip is at the cavoatrial junction. Nasogastric tube tip and side port are below the diaphragm in the stomach. No pneumothorax. There is no edema or consolidation. Heart size and pulmonary vascularity are within normal limits. No adenopathy. No bone lesions apparent. IMPRESSION: Tube and catheter positions as described without pneumothorax. Note that the endotracheal tube tip is close to the carina. It may be prudent to withdrawal endotracheal tube approximately 3 cm. No edema or consolidation. These results will be called to the ordering clinician or representative by the Radiologist Assistant, and communication documented in the PACS or zVision Dashboard. Electronically Signed   By: Lowella Grip III M.D.   On: 05/19/2015 11:16   Dg Chest Port 1 View  05/19/2015  CLINICAL DATA:  Acute respiratory failure EXAM: PORTABLE CHEST 1 VIEW COMPARISON:  May 18, 2015 FINDINGS: The ETT is in good position. No other change in support apparatus. No pneumothorax. Bibasilar atelectasis, right greater than left. No other interval changes. IMPRESSION: The ETT is in good position.  No other change. Electronically Signed   By: Dorise Bullion III M.D   On: 05/19/2015 08:11   Dg Chest Port 1 View  05/18/2015  CLINICAL DATA:  Acute respiratory failure.  Endotracheal tube. EXAM: PORTABLE CHEST 1 VIEW COMPARISON:  05/16/2015 FINDINGS: Endotracheal tube terminates approximately 2.5 cm above the carina. Right jugular central venous catheter terminates over the cavoatrial junction. Enteric tube courses into  the left upper abdomen with tip not imaged. Lung volumes remain diminished with persistent mild patchy opacities in both lung bases. Trace bilateral pleural effusions are questioned. No pneumothorax. IMPRESSION: 1. Support devices as above. 2. Low lung volumes with persistent mild bibasilar opacities which may reflect atelectasis. Electronically Signed   By: Logan Bores M.D.   On: 05/18/2015 08:21   Dg Chest Portable 1 View  05/16/2015  CLINICAL DATA:  Status post central line placement EXAM: PORTABLE CHEST 1 VIEW COMPARISON:  05/15/2015 FINDINGS: Cardiac shadow is stable. An endotracheal tube is now seen and lies just above the carina and should be withdrawn 1-2 cm. A nasogastric catheter is seen coiled within the stomach. A right jugular central line is noted at the cavoatrial junction. No pneumothorax is seen. Bilateral pleural effusions are noted with bibasilar patchy opacities. No bony abnormality is seen. IMPRESSION: Endotracheal tube at the level of the carina and should be withdrawn 1-2 cm. No pneumothorax following central line placement. Small bilateral pleural effusions and bibasilar patchy opacities. These results will be called to the ordering clinician or representative by the Radiologist Assistant, and communication documented in the PACS or zVision Dashboard. Electronically Signed   By: Inez Catalina M.D.   On: 05/16/2015 13:35   Dg Abd Portable 1v  05/26/2015  CLINICAL DATA:  Encounter for NG tube placement EXAM: PORTABLE ABDOMEN - 1 VIEW COMPARISON:  None. FINDINGS: The feeding tube extends into the stomach. The tip is within the fourth portion duodenum not changed from prior. IMPRESSION: No change in position of feeding tube. Electronically  Signed   By: Suzy Bouchard M.D.   On: 05/26/2015 15:31   Dg Abd Portable 1v  05/22/2015  CLINICAL DATA:  Feeding tube placement EXAM: PORTABLE ABDOMEN - 1 VIEW COMPARISON:  05/19/2015 FINDINGS: NG tube is been exchange for a feeding tube. Feeding  tube tip is in the distal fourth portion of the duodenum. Normal bowel gas pattern. No significant dilatation or obstruction. No acute osseous finding. IMPRESSION: Feeding tube tip distal duodenum. Electronically Signed   By: Jerilynn Mages.  Shick M.D.   On: 05/22/2015 15:29   Dg Abd Portable 1v  05/19/2015  CLINICAL DATA:  Evaluate OG tube. EXAM: PORTABLE ABDOMEN - 1 VIEW COMPARISON:  May 16, 2015 FINDINGS: The OG tube terminates in the distal stomach. No other acute abnormalities identified. IMPRESSION: Appropriate placement of OG tube. Electronically Signed   By: Dorise Bullion III M.D   On: 05/19/2015 11:17   Dg Abd Portable 1v  05/16/2015  CLINICAL DATA:  OG tube placement EXAM: PORTABLE ABDOMEN - 1 VIEW COMPARISON:  None. FINDINGS: Enteric tube is in place with the tip in the very pyloric region and the side port in the distal stomach. Nonobstructive bowel gas pattern. IMPRESSION: Enteric tube tip in the peripyloric region. Electronically Signed   By: Rolm Baptise M.D.   On: 05/16/2015 13:34    Denette Hass Corinda Gubler, MD 05/29/2015, 6:59 AM PGY-1, [facility] Intern pager: [phone], text pages welcome

## 2015-05-30 DIAGNOSIS — J95851 Ventilator associated pneumonia: Secondary | ICD-10-CM

## 2015-05-30 LAB — MISC LABCORP TEST (SEND OUT): Labcorp test code: 86005

## 2015-05-30 LAB — RENAL FUNCTION PANEL
Albumin: 2.4 g/dL — ABNORMAL LOW (ref 3.5–5.0)
Anion gap: 6 (ref 5–15)
BUN: 19 mg/dL (ref 6–20)
CO2: 28 mmol/L (ref 22–32)
Calcium: 8.8 mg/dL — ABNORMAL LOW (ref 8.9–10.3)
Chloride: 110 mmol/L (ref 101–111)
Creatinine, Ser: 0.41 mg/dL — ABNORMAL LOW (ref 0.44–1.00)
GFR calc Af Amer: >60 mL/min (ref >60)
GFR calc non Af Amer: >60 mL/min (ref >60)
Glucose, Bld: 124 mg/dL — ABNORMAL HIGH (ref 65–99)
Phosphorus: 2.5 mg/dL (ref 2.5–4.6)
Potassium: 3.9 mmol/L (ref 3.5–5.1)
Sodium: 144 mmol/L (ref 135–145)

## 2015-05-30 LAB — GLUCOSE, CAPILLARY
Glucose-Capillary: 106 mg/dL — ABNORMAL HIGH (ref 65–99)
Glucose-Capillary: 109 mg/dL — ABNORMAL HIGH (ref 65–99)
Glucose-Capillary: 109 mg/dL — ABNORMAL HIGH (ref 65–99)
Glucose-Capillary: 116 mg/dL — ABNORMAL HIGH (ref 65–99)
Glucose-Capillary: 119 mg/dL — ABNORMAL HIGH (ref 65–99)
Glucose-Capillary: 123 mg/dL — ABNORMAL HIGH (ref 65–99)

## 2015-05-30 LAB — CBC WITH DIFFERENTIAL/PLATELET
Basophils Absolute: 0 10*3/uL (ref 0.0–0.1)
Basophils Relative: 0 %
Eosinophils Absolute: 0 10*3/uL (ref 0.0–0.7)
Eosinophils Relative: 0 %
HCT: 32.6 % — ABNORMAL LOW (ref 36.0–46.0)
Hemoglobin: 9.9 g/dL — ABNORMAL LOW (ref 12.0–15.0)
Lymphocytes Relative: 16 %
Lymphs Abs: 2.2 10*3/uL (ref 0.7–4.0)
MCH: 24.9 pg — ABNORMAL LOW (ref 26.0–34.0)
MCHC: 30.4 g/dL (ref 30.0–36.0)
MCV: 81.9 fL (ref 78.0–100.0)
Monocytes Absolute: 0.6 10*3/uL (ref 0.1–1.0)
Monocytes Relative: 4 %
Neutro Abs: 11.3 10*3/uL — ABNORMAL HIGH (ref 1.7–7.7)
Neutrophils Relative %: 80 %
Platelets: 382 10*3/uL (ref 150–400)
RBC: 3.98 MIL/uL (ref 3.87–5.11)
RDW: 17.7 % — ABNORMAL HIGH (ref 11.5–15.5)
WBC: 14.1 10*3/uL — ABNORMAL HIGH (ref 4.0–10.5)

## 2015-05-30 LAB — MAGNESIUM: Magnesium: 2.2 mg/dL (ref 1.7–2.4)

## 2015-05-30 MED ORDER — SODIUM CHLORIDE 0.9% FLUSH
10.0000 mL | INTRAVENOUS | Status: DC | PRN
Start: 2015-05-30 — End: 2015-06-08

## 2015-05-30 MED ORDER — LOPERAMIDE HCL 1 MG/5ML PO LIQD
2.0000 mg | ORAL | Status: DC | PRN
  Administered 2015-06-02: 2 mg
  Filled 2015-05-30 (×4): qty 10

## 2015-05-30 NOTE — Progress Notes (Signed)
Changed PT water bottle and trach set up 

## 2015-05-30 NOTE — Progress Notes (Signed)
Spoke w pt. She states she prefers no to go home immed from hosp. She would be alone during the day. Her fam all works. Will ask sw to eval for poss short term snf.

## 2015-05-30 NOTE — Progress Notes (Signed)
Family Medicine Teaching Service Daily Progress Note Intern Pager: [phone]  Patient name: Frances Medina Medical record number: [id number] Date of birth: October 25, 1960 Age: 55 y.o. Gender: female  Primary Care Provider: Andrena Mews, MD Consultants: Pulmonology, Neurology Code Status: Full  Pt Overview and Major Events to Date:  2/10 >> D/C from hospital  3/13 >> Onset of severe SOB and weakness  3/14 >> Presented to ED  3/15 >> Transferred to Neuro ICU, IVIG and ETT  3/18 >> Unsuccessful extubation , reintubated.  3/19 >> Completed 5 days IVIG 3/21 >> Unsuccessful extubation, reintubated 3/22 >> Trach placed, solu-medrol 3/22  3/27 >> Neurology signed off 3/28 >> Begin IV vancomycin and imipenem  Assessment and Plan: Frances Medina is a 55 y.o. female presenting with myasthenia gravis exacerbation. PMH is significant for HTN,uterine fibroids, depression, anxiety, migraines and recently diagnosed myasthenia gravis, and undergoing work-up of splenomegaly with lymphadenopathy.   Myasthenia Gravis Exacerbation: Unclear trigger of symptoms, though discontinuation of steroids is most likely cause. Rhinorrhea but no fevers or leukocytosis. UA was positive for nitrites and trace leukocytes. Initial plan was to admit to SDU on continuous cardiopulmonary monitoring. However, initial NIF score was -25, so patient was transferred to ICU and intubated. Follows with Neurologist Dr. Audelia Acton of [facility]. She has tolerated trach collar for > 72 hours now.  - Neurology consulting, appreciate recommendations: s/p IV solumedrol and 5 days IVIG; did not recommend further steroids - Pulmonology following respiratory secretions and hopeful to downsize trach, otherwise feeding tube may be needed in next couple of days - s/p perc trach on 28% FiO2; appreciate pulmonology recommendations - Tube feedings - Pyridostigmine 60 mg qid (increased from TID prior to admission) - PT/OT consulted --> HH PT,  intermittent supervision - Continue home prn albuterol for wheezing/SOB - Drugs to avoid in MG: aminoglycosides, clindamycin, fluoroquinolones, ketolides, vancomycin, beta blockers, procainamide, quinidine, anti-PD-1 monoclonal antibodies, botox, chloroquine, hydroxychloroquine, magnesium, penicillamine, quinine  Presumed VAP: Will treat as VAP, given changes in vital signs and increased respiratory secretions. CXR 3/27 worse compared to 3/22 with decreased lung volume, possibly worsened bilateral pleural effusions and opacities concerning for infection vs. atelectasis. White count has improved with treatment down to 14.1 today (05/30/15) from 19.4 yesterday. - Continue IV imipenem/vanc (3/28 >>) - Obtain tracheal aspirate --> culture with NG 1 day but gram stain with rare gram positive cocci in pairs and rare gram negative rods  Vital sign instability: Blood pressures improved and only occasional tachycardia to 107 today, and still afebrile.  - Obtain blood cultures --> NG < 24 hours  Diarrhea: Improving, had only 2 loose BMs yesterday, thinks NG feeds may be contributing - C. Diff panel resulted as antibody positive, toxin negative, so will not start flagyl  HFpEF: ECHO 04/09/15 performed due to SOB showed EF of 60-65% and G1DD. Patient currently appears euvolemic. - Monitor fluid status as administer MIVFs while NPO.   Splenomegaly w/Lymphadenopathy: Being followed by Oncologist Dr. Irene Limbo. Work-up thus far has shown HIV neg, mono screen neg, elevated LDH at 395 and low haptoglobin at <10 (with recent IVIG treatment). To repeat hemolytic markers, CBC and Coombs' test later this month at follow-up. No hypermetabolic masses or lymphadenopathy seen on PET scan 05/14/15. - Continue to monitor CBCs - Consider pursuing autoimmune work-up while hospitalized   Hx of HTN: 93/58-144/97 yesterday. BPs on the softer side overnight, raising concern for infection like PNA with increased secretions.  - Not on  any home anti-hypertensives -  Continue to monitor. Will add prn if needed.   Depression and Anxiety: - Zoloft 50 mg daily daily - Klonopin 0.5 mg BID  E. Coli UTI: s/p 3 days bactrim  FEN/GI: Tube feeds, pepcid Prophylaxis: Heparin  Disposition: Transfer to floor; plan to decrease size of tracheostomy tube if secretions improve, if not may need feeding tube; anticipate dispo to in-hospital LTAC   Subjective:  Patient complains of pain at trach site when she coughs. No diarrhea yet today.    Objective: Temp:  [98 F (36.7 C)-98.2 F (36.8 C)] 98.1 F (36.7 C) (03/29 1133) Pulse Rate:  [73-107] 82 (03/29 1133) Resp:  [18-28] 22 (03/29 1133) BP: (109-161)/(78-108) 144/96 mmHg (03/29 0900) SpO2:  [95 %-100 %] 96 % (03/29 1133) FiO2 (%):  [28 %] 28 % (03/29 1133) Weight:  [258 lb 2.5 oz (117.1 kg)] 258 lb 2.5 oz (117.1 kg) (03/29 0544) Physical Exam: General: Obese female, resting in bed awake, able to sit up with ease for exam  Cardiovascular: RRR, S1, S2, no m/r/g Chest: Coarse breath sounds throughout but moving air well, trach collar in place Abdomen: +BS, soft, NT, ND Extremities: Moves all spontaneously, Strength 5-/5 of LE and 5/5 UEs  Laboratory:  Recent Labs Lab 05/28/15 0500 05/29/15 0310 05/30/15 0250  WBC 15.8* 19.4* 14.1*  HGB 10.5* 10.8* 9.9*  HCT 33.9* 35.1* 32.6*  PLT 387 403* 382    Recent Labs Lab 05/28/15 0500 05/29/15 0310 05/30/15 0250  NA 143 144 144  K 3.5 3.4* 3.9  CL 106 106 110  CO2 _0 BUN 23* 23* 19  CREATININE 0.48 0.45 0.41*  CALCIUM 8.9 8.9 8.8*  GLUCOSE 114* 129* 124*    Imaging/Diagnostic Tests: Dg Chest 1 View  05/28/2015  CLINICAL DATA:  Patient with respiratory failure. Shortness of breath. EXAM: CHEST 1 VIEW COMPARISON:  Chest radiograph 05/23/2015 FINDINGS: Tracheostomy tube terminates in the mid trachea. Enteric tube courses inferior to the diaphragm. Right upper extremity PICC line is present with tip projecting  over the right atrium. Low lung volumes. Stable cardiac and mediastinal contours. Small bilateral pleural effusions. Bibasilar airspace opacities. No pneumothorax. IMPRESSION: Right upper extremity PICC line is present with tip projecting over the right atrium. Low lung volumes, small bilateral pleural effusions and underlying opacities favored to represent atelectasis or infection. Associated interstitial pulmonary edema. Electronically Signed   By: Lovey Newcomer M.D.   On: 05/28/2015 13:06   Dg Chest 2 View  05/15/2015  CLINICAL DATA:  Shortness of breath. EXAM: CHEST  2 VIEW COMPARISON:  April 07, 2015. FINDINGS: The heart size and mediastinal contours are within normal limits. Hypoinflation of the lungs is noted. Mild bibasilar subsegmental atelectasis is noted. No pneumothorax or pleural effusion is noted. The visualized skeletal structures are unremarkable. IMPRESSION: Hypoinflation of the lungs with mild bibasilar subsegmental atelectasis. Electronically Signed   By: Marijo Conception, M.D.   On: 05/15/2015 21:06   Nm Pet Image Initial (pi) Skull Base To Thigh  05/14/2015  CLINICAL DATA:  Initial treatment strategy for lymphadenopathy and splenomegaly. Possibly lymphoproliferative disease. Myasthenia gravis. EXAM: NUCLEAR MEDICINE PET SKULL BASE TO THIGH TECHNIQUE: 14.8 mCi F-18 FDG was injected intravenously. Full-ring PET imaging was performed from the skull base to thigh after the radiotracer. CT data was obtained and used for attenuation correction and anatomic localization. FASTING BLOOD GLUCOSE:  Value: 97 mg/dl COMPARISON:  Chest CTA on 04/07/2015 FINDINGS: NECK No hypermetabolic lymph nodes in the neck. CHEST No  hypermetabolic mediastinal or hilar nodes. No suspicious pulmonary nodules on the CT scan. ABDOMEN/PELVIS No abnormal hypermetabolic activity within the liver, pancreas, adrenal glands, or spleen. No evidence of splenomegaly. No hypermetabolic lymph nodes in the abdomen or pelvis. SKELETON  No focal hypermetabolic activity to suggest skeletal metastasis. Mild diffusely increased hypermetabolic activity seen throughout the axial and appendicular bone marrow. IMPRESSION: No hypermetabolic masses or lymphadenopathy identified. Mild diffusely increased metabolic activity throughout the bone marrow, without focal metastatic lesions. This finding is of uncertain clinical significance, and is likely due to marrow stimulation. Bone marrow biopsy could be considered further evaluation if clinically warranted. Electronically Signed   By: Earle Gell M.D.   On: 05/14/2015 16:33   Dg Chest Port 1 View  05/23/2015  CLINICAL DATA:  Tracheostomy placement. EXAM: PORTABLE CHEST 1 VIEW COMPARISON:  Study obtained earlier in the day FINDINGS: There is now a tracheostomy present with the tracheostomy catheter tip 2.4 cm above the carina. Feeding tube tip is below the diaphragm. Central catheter tip is at the cavoatrial junction. No pneumothorax. There are bilateral pleural effusions, larger on the right than on the left. There is atelectatic change in the bases. Heart is mildly enlarged. The pulmonary vascularity appears normal. No adenopathy. No bone lesions. IMPRESSION: Tube and catheter positions as described without pneumothorax. Mild cardiomegaly with bilateral pleural effusions. Suspect a degree of congestive heart failure. There is bibasilar atelectasis. Electronically Signed   By: Lowella Grip III M.D.   On: 05/23/2015 16:17   Portable Chest Xray  05/23/2015  CLINICAL DATA:  55 year old female female with respiratory failure. Myasthenia gravis. Initial encounter. EXAM: PORTABLE CHEST 1 VIEW COMPARISON:  05/22/2015 and earlier. FINDINGS: Portable AP semi upright view at 0604 hours. Stable endotracheal tube tip at the level the clavicles. Enteric tube now in place, courses to the abdomen and tip not included. Multiple EKG leads and wires also overlie the chest. Stable right IJ central line. Continued low  lung volumes with right greater than left basilar veiling opacity. No pneumothorax. No acute pulmonary edema. Mediastinal contours remain within normal limits. IMPRESSION: 1. Enteric tube placed and courses to the abdomen, tip not included. Otherwise, stable lines and tubes. 2. Stable ventilation. Low lung volumes with bibasilar opacity probably reflecting atelectasis and small effusions. Electronically Signed   By: Genevie Ann M.D.   On: 05/23/2015 07:36   Portable Chest Xray  05/22/2015  CLINICAL DATA:  Re-intubation today EXAM: PORTABLE CHEST 1 VIEW COMPARISON:  05/22/2015 FINDINGS: Unchanged position of endotracheal tube and internal jugular central line. Cardiac enlargement stable. Bibasilar hypoventilatory change with low lung volumes. IMPRESSION: Bilateral lower lobe atelectasis. Findings similar to prior study, with mildly increased opacity at the bases. Electronically Signed   By: Skipper Cliche M.D.   On: 05/22/2015 12:14   Dg Chest Port 1 View  05/22/2015  CLINICAL DATA:  Acute respiratory failure EXAM: PORTABLE CHEST 1 VIEW COMPARISON:  05/21/15 FINDINGS: Cardiomediastinal silhouette is stable. Endotracheal tube in place with tip 2.5 cm above the carina. NG tube in place. Right IJ central line is unchanged in position. Central mild vascular congestion without convincing pulmonary edema persistent mild basilar atelectasis. No segmental infiltrate. IMPRESSION: Stable support apparatus. Central mild vascular congestion without convincing pulmonary edema. Persistent bilateral basilar atelectasis. Electronically Signed   By: Lahoma Crocker M.D.   On: 05/22/2015 07:50   Dg Chest Port 1 View  05/21/2015  CLINICAL DATA:  Acute respiratory failure EXAM: PORTABLE CHEST 1 VIEW COMPARISON:  05/19/2015  FINDINGS: Endotracheal tube terminates 5 cm above the carina. Mild bibasilar opacities, likely atelectasis. No frank interstitial edema. No pleural effusion or pneumothorax. The heart is normal in size. Right IJ  venous catheter terminates the cavoatrial junction. Enteric tube courses into the mid stomach. IMPRESSION: Endotracheal tube terminates 5 cm above the carina. Mild bibasilar opacities, likely atelectasis. No frank interstitial edema. Electronically Signed   By: Julian Hy M.D.   On: 05/21/2015 10:45   Dg Chest Port 1 View  05/19/2015  CLINICAL DATA:  Hypoxia EXAM: PORTABLE CHEST 1 VIEW COMPARISON:  May 19, 2015 study obtained earlier in the day FINDINGS: Endotracheal tube tip is 9 mm above the carina. Central catheter tip is at the cavoatrial junction. Nasogastric tube tip and side port are below the diaphragm in the stomach. No pneumothorax. There is no edema or consolidation. Heart size and pulmonary vascularity are within normal limits. No adenopathy. No bone lesions apparent. IMPRESSION: Tube and catheter positions as described without pneumothorax. Note that the endotracheal tube tip is close to the carina. It may be prudent to withdrawal endotracheal tube approximately 3 cm. No edema or consolidation. These results will be called to the ordering clinician or representative by the Radiologist Assistant, and communication documented in the PACS or zVision Dashboard. Electronically Signed   By: Lowella Grip III M.D.   On: 05/19/2015 11:16   Dg Chest Port 1 View  05/19/2015  CLINICAL DATA:  Acute respiratory failure EXAM: PORTABLE CHEST 1 VIEW COMPARISON:  May 18, 2015 FINDINGS: The ETT is in good position. No other change in support apparatus. No pneumothorax. Bibasilar atelectasis, right greater than left. No other interval changes. IMPRESSION: The ETT is in good position.  No other change. Electronically Signed   By: Dorise Bullion III M.D   On: 05/19/2015 08:11   Dg Chest Port 1 View  05/18/2015  CLINICAL DATA:  Acute respiratory failure.  Endotracheal tube. EXAM: PORTABLE CHEST 1 VIEW COMPARISON:  05/16/2015 FINDINGS: Endotracheal tube terminates approximately 2.5 cm above the  carina. Right jugular central venous catheter terminates over the cavoatrial junction. Enteric tube courses into the left upper abdomen with tip not imaged. Lung volumes remain diminished with persistent mild patchy opacities in both lung bases. Trace bilateral pleural effusions are questioned. No pneumothorax. IMPRESSION: 1. Support devices as above. 2. Low lung volumes with persistent mild bibasilar opacities which may reflect atelectasis. Electronically Signed   By: Logan Bores M.D.   On: 05/18/2015 08:21   Dg Chest Portable 1 View  05/16/2015  CLINICAL DATA:  Status post central line placement EXAM: PORTABLE CHEST 1 VIEW COMPARISON:  05/15/2015 FINDINGS: Cardiac shadow is stable. An endotracheal tube is now seen and lies just above the carina and should be withdrawn 1-2 cm. A nasogastric catheter is seen coiled within the stomach. A right jugular central line is noted at the cavoatrial junction. No pneumothorax is seen. Bilateral pleural effusions are noted with bibasilar patchy opacities. No bony abnormality is seen. IMPRESSION: Endotracheal tube at the level of the carina and should be withdrawn 1-2 cm. No pneumothorax following central line placement. Small bilateral pleural effusions and bibasilar patchy opacities. These results will be called to the ordering clinician or representative by the Radiologist Assistant, and communication documented in the PACS or zVision Dashboard. Electronically Signed   By: Inez Catalina M.D.   On: 05/16/2015 13:35   Dg Abd Portable 1v  05/26/2015  CLINICAL DATA:  Encounter for NG tube placement EXAM: PORTABLE ABDOMEN -  1 VIEW COMPARISON:  None. FINDINGS: The feeding tube extends into the stomach. The tip is within the fourth portion duodenum not changed from prior. IMPRESSION: No change in position of feeding tube. Electronically Signed   By: Suzy Bouchard M.D.   On: 05/26/2015 15:31   Dg Abd Portable 1v  05/22/2015  CLINICAL DATA:  Feeding tube placement EXAM:  PORTABLE ABDOMEN - 1 VIEW COMPARISON:  05/19/2015 FINDINGS: NG tube is been exchange for a feeding tube. Feeding tube tip is in the distal fourth portion of the duodenum. Normal bowel gas pattern. No significant dilatation or obstruction. No acute osseous finding. IMPRESSION: Feeding tube tip distal duodenum. Electronically Signed   By: Jerilynn Mages.  Shick M.D.   On: 05/22/2015 15:29   Dg Abd Portable 1v  05/19/2015  CLINICAL DATA:  Evaluate OG tube. EXAM: PORTABLE ABDOMEN - 1 VIEW COMPARISON:  May 16, 2015 FINDINGS: The OG tube terminates in the distal stomach. No other acute abnormalities identified. IMPRESSION: Appropriate placement of OG tube. Electronically Signed   By: Dorise Bullion III M.D   On: 05/19/2015 11:17   Dg Abd Portable 1v  05/16/2015  CLINICAL DATA:  OG tube placement EXAM: PORTABLE ABDOMEN - 1 VIEW COMPARISON:  None. FINDINGS: Enteric tube is in place with the tip in the very pyloric region and the side port in the distal stomach. Nonobstructive bowel gas pattern. IMPRESSION: Enteric tube tip in the peripyloric region. Electronically Signed   By: Rolm Baptise M.D.   On: 05/16/2015 13:34    Hillary Corinda Gubler, MD 05/30/2015, 11:38 AM PGY-1, [place] Intern pager: [phone], text pages welcome

## 2015-05-30 NOTE — Progress Notes (Signed)
PULMONARY / CRITICAL CARE MEDICINE   Name: Frances Medina MRN: [id number] DOB: 1960-09-30    ADMISSION DATE:  05/15/2015 CONSULTATION DATE: 05/16/2015  REFERRING MD:   CHIEF COMPLAINT: Weakness, dysphagia, SOB in setting of known MG   HISTORY OF PRESENT ILLNESS:  55 y.o. female presenting to ED on 3/14 with increased weakness, SOB, and dysphagia over the last 7 d PTA.. Of note, she was recently diagnosed with myasthenia gravis during hospitalization in January 2017, during which time she required treatment with IVIG and was started on pyridostigmine. She had another hospitalization for MG flare in February, also requiring treatment with IVIG and steroid taper, thought to have been triggered by a UTI. She was supposed to have continued prednisone upon last hospital discharge 04/13/15, but patient was not aware that she had a prescription. She called her neurologist Dr. Audelia Acton last Friday about her dysphagia, and he recommended increasing frequency of pyridostigmine 60 mg to q6h from q8h. He also noted that patient would need to start steroids or be seen in the ED. Patient had an appointment to see him 05/16/15 with plans to start prednisone, but with increasing fatigue, she decided to be evaluated in the ED. Swallowing has become more difficult since last Friday, 05/11/15. She was only able to eat "a few nuggets" today. She was, however, able to take all but her evening dose of pyridostigmine. She has been able to drink fluids but not as much as usual. She and her husband report that she has trouble with her speech if she talks too much. She also complains of chest pain under her breast that feels like "it's swollen" and cuts off her breath since yesterday. She has had rhinorrhea with post-nasal drip and cough in the middle of the night since last weak but has not taken any new OTC medications. No sick contacts.In the ED on 3/14, patient's vital signs were stable, and SpO2 was 99% on room air. She was to be  admitted to the ICU. Since presentation her swallowing fxn declined further w/ difficulty swallowing oral secretions. A NIF was recorded at -25, and her VC was 592ml. PCCM was consulted given concern for impending respiratory failure.   SUBJECTIVE: Trache collar x> 48 hrs >> comfortable. Pt has a lot of trache secretions -- requiring frequent suctioning.less secretions last 2 days.  No other issues, Diarrhea is better. (-) abd pain. On TF. Walking around.    REVIEW OF SYSTEMS:  Less SOB. Less trache secretions Less diarrhea  VITAL SIGNS: BP 144/96 mmHg  Pulse 84  Temp(Src) 98 F (36.7 C) (Oral)  Resp 20  Ht 5' (1.524 m)  Wt 258 lb 2.5 oz (117.1 kg)  BMI 50.42 kg/m2  SpO2 97%  HEMODYNAMICS:    VENTILATOR SETTINGS: Vent Mode:  [-]  FiO2 (%):  [28 %] 28 %  INTAKE / OUTPUT: I/O last 3 completed shifts: In: 75 [NG/GT:2395; IV Piggyback:1050] Out: 2 [Urine:1; Stool:1]  PHYSICAL EXAMINATION: General:  Awake. Alert. On tracheostomy collar. Integument:  Warm & dry. No rash on exposed skin.  HEENT:  No scleral injection. Tracheostomy in place. Moist mucus membranes. Yellow trache secretions Cardiovascular:  Regular rate. No edema. No appreciable JVD.  Pulmonary:  Clear bilaterally to auscultation. Normal work of breathing on tracheostomy collar. Abdomen: Soft. Normal bowel sounds. Nondistended.  Neurological:  Follows commands. Moving all 4 extremities equally. Grossly nonfocal.  LABS:  BMET  Recent Labs Lab 05/28/15 0500 05/29/15 0310 05/30/15 0250  NA 143 144 144  K 3.5 3.4* 3.9  CL 106 106 110  CO2 29 29 28   BUN 23* 23* 19  CREATININE 0.48 0.45 0.41*  GLUCOSE 114* 129* 124*    Electrolytes  Recent Labs Lab 05/28/15 0500 05/29/15 0310 05/30/15 0250  CALCIUM 8.9 8.9 8.8*  MG 2.3 2.3 2.2  PHOS 3.0 2.7 2.5    CBC  Recent Labs Lab 05/28/15 0500 05/29/15 0310 05/30/15 0250  WBC 15.8* 19.4* 14.1*  HGB 10.5* 10.8* 9.9*  HCT 33.9* 35.1* 32.6*  PLT  387 403* 382    Coag's No results for input(s): APTT, INR in the last 168 hours.  Sepsis Markers No results for input(s): LATICACIDVEN, PROCALCITON, O2SATVEN in the last 168 hours.  ABG No results for input(s): PHART, PCO2ART, PO2ART in the last 168 hours.  Liver Enzymes  Recent Labs Lab 05/28/15 0500 05/29/15 0310 05/30/15 0250  ALBUMIN 2.7* 2.7* 2.4*    Cardiac Enzymes No results for input(s): TROPONINI, PROBNP in the last 168 hours.  Glucose  Recent Labs Lab 05/29/15 1214 05/29/15 1637 05/29/15 1947 05/30/15 0020 05/30/15 0547 05/30/15 0826  GLUCAP 114* 124* 125* 116* 119* 106*    Imaging No results found.   STUDIES:  CXR 3/14 >> Hypoinflation of the lungs with mild bibasilar subsegmental atelectasis  Abd X-ray 3/21: Feeding tube with tip in distal duodenum. Port CXR 3/22: Right internal jugular central venous catheter in good position. Endotracheal tube in good position. Low lung volumes. Minimal basilar atelectasis. Port CXR 3/22:  Trach in place w/ tip approximately 2.5cm above carina. No new opacity. R IJ CVL in place. Bilateral effusions right greater than left.  MICROBIOLOGY: Urine Ctx 3/14:  E coli MRSA Nasal Ctx 3/15:  Negative   ANTIBIOTICS: Bactrim 3/18 - 3/20   SIGNIFICANT EVENTS: 2/10 >> D/C from hospital  3/13 >> Onset of severe SOB and weakness  3/14 >> Present to ED  3/15 >> IVIG and ETT  3/18 >> Unsuccessful extubation , reintubated.  3/21 >> Unsuccessful extubation, reintubated 3/22 >> Trach placed by JY  LINES/TUBES: Shiley #6 Cuffed (Placed by Hyman Bible) 3/22>> R IJ CVL 3/15 >>  L Nare NGT 3/21 - 3/25; 3/25>>> OETT 7.0 3/15 - 3/18; 3/18 - 3/21; 3/21 - 3/22  ASSESSMENT / PLAN:  PULMONARY A: Acute Respiratory Failure due to flare of MG - Reintubated twice for stridor/ cord edema. s/p trach. (3/22) -- doing well  P:   Continue T-collar as long as tolerated. Has a 6 cuffed trache. Recent trache >> will hold off on trache change.  Still with secretions issue but better. Needs to have trache changed to a cuffless on Thursday or Friday. Will likely keep same size >> secretion issues still Vent available prn Continue PMV trials Anticipate transfer to tele bed.  dispo : home with services vs short stay at SNF  GASTROINTESTINAL A:   Dysphagia  H/O GERD  Diarrhea >> better. C diff toxin (-) but Ag (+)   P:  Continue Tube Feedings Pepcid VT bid Speech Eval as appropriate On TF  NEUROLOGIC: Myasthenia Gravis, S/P exacerbation >> clinically better P:   Neurology following peripherally Continue mestinon 60 q 6 Fentanyl IV prn  Zoloft home dose Klonopin 0.5mg  bid VT -home meds Ambulating with PT  FAMILY   - Updates: No family at bedside.    Monica Becton, MD 05/30/2015, 11:02 AM [facility] Pager [phone] After 3 pm or if no answer, call [phone]

## 2015-05-30 NOTE — Progress Notes (Addendum)
Speech Language Pathology Treatment: Nada Boozer Speaking valve  Patient Details Name: Frances Medina MRN: [id number] DOB: 01/07/1961 Today's Date: 05/30/2015 Time: 1001-1016 SLP Time Calculation (min) (ACUTE ONLY): 15 min  Assessment / Plan / Recommendation Clinical Impression  Cuff deflated upon SLP arrival. Pt has strong volitional cough and able to suction at tracheal level with [place]. PMSV trialed x2 for duration of 10-15 seconds with valve popping off trach hub. Back pressure observed, which is indicative of decreased airflow directed to upper airway. Pt, at times, requested SLP to remove valve. RR, SpO2 and pulse remained WFL. SLP provided tactile assist with valve to provide increased pressure for expectoration of secretions to oral cavity. PMSV should only be donned with SLP. Please consider cuffless trach to allow pt greater tracheal patency to tolerate PMSV for extended period of time. Pt has had this particular trach in place for a week.   HPI HPI: Frances Medina is a 55 y.o. female presenting with myasthenia gravis exacerbation. PMH is significant for HTN, uterine fibroids, depression, anxiety, migraines and recently diagnosed myasthenia gravis, and undergoing work-up of splenomegaly with lymphadenopathy.Intubated 3/15, trach placed 3/22.      SLP Plan  Continue with current plan of care     Recommendations  Diet recommendations: NPO Medication Administration: Via alternative means      Patient may use Passy-Muir Speech Valve: with SLP only PMSV Supervision: Full MD: Please consider changing trach tube to : Cuffless      Oral Care Recommendations: Oral care BID Follow up Recommendations: Inpatient Rehab (TBD) Plan: Continue with current plan of care     GO                Channing Savich 05/30/2015, 12:16 PM  Titus Mould, Student-SLP

## 2015-05-31 DIAGNOSIS — Z978 Presence of other specified devices: Secondary | ICD-10-CM | POA: Insufficient documentation

## 2015-05-31 DIAGNOSIS — Z789 Other specified health status: Secondary | ICD-10-CM

## 2015-05-31 LAB — CBC WITH DIFFERENTIAL/PLATELET
Basophils Absolute: 0 10*3/uL (ref 0.0–0.1)
Basophils Relative: 0 %
Eosinophils Absolute: 0 10*3/uL (ref 0.0–0.7)
Eosinophils Relative: 0 %
HCT: 31.5 % — ABNORMAL LOW (ref 36.0–46.0)
Hemoglobin: 9.7 g/dL — ABNORMAL LOW (ref 12.0–15.0)
Lymphocytes Relative: 18 %
Lymphs Abs: 2.1 10*3/uL (ref 0.7–4.0)
MCH: 25.2 pg — ABNORMAL LOW (ref 26.0–34.0)
MCHC: 30.8 g/dL (ref 30.0–36.0)
MCV: 81.8 fL (ref 78.0–100.0)
Monocytes Absolute: 0.4 10*3/uL (ref 0.1–1.0)
Monocytes Relative: 4 %
Neutro Abs: 8.9 10*3/uL — ABNORMAL HIGH (ref 1.7–7.7)
Neutrophils Relative %: 78 %
Platelets: 342 10*3/uL (ref 150–400)
RBC: 3.85 MIL/uL — ABNORMAL LOW (ref 3.87–5.11)
RDW: 17.9 % — ABNORMAL HIGH (ref 11.5–15.5)
WBC: 11.4 10*3/uL — ABNORMAL HIGH (ref 4.0–10.5)

## 2015-05-31 LAB — RENAL FUNCTION PANEL
Albumin: 2.5 g/dL — ABNORMAL LOW (ref 3.5–5.0)
Anion gap: 8 (ref 5–15)
BUN: 16 mg/dL (ref 6–20)
CO2: 29 mmol/L (ref 22–32)
Calcium: 9 mg/dL (ref 8.9–10.3)
Chloride: 105 mmol/L (ref 101–111)
Creatinine, Ser: 0.4 mg/dL — ABNORMAL LOW (ref 0.44–1.00)
GFR calc Af Amer: >60 mL/min (ref >60)
GFR calc non Af Amer: >60 mL/min (ref >60)
Glucose, Bld: 126 mg/dL — ABNORMAL HIGH (ref 65–99)
Phosphorus: 3.4 mg/dL (ref 2.5–4.6)
Potassium: 3.5 mmol/L (ref 3.5–5.1)
Sodium: 142 mmol/L (ref 135–145)

## 2015-05-31 LAB — CULTURE, RESPIRATORY W GRAM STAIN: Culture: NORMAL

## 2015-05-31 LAB — MAGNESIUM: Magnesium: 2 mg/dL (ref 1.7–2.4)

## 2015-05-31 LAB — GLUCOSE, CAPILLARY
Glucose-Capillary: 100 mg/dL — ABNORMAL HIGH (ref 65–99)
Glucose-Capillary: 101 mg/dL — ABNORMAL HIGH (ref 65–99)
Glucose-Capillary: 106 mg/dL — ABNORMAL HIGH (ref 65–99)
Glucose-Capillary: 107 mg/dL — ABNORMAL HIGH (ref 65–99)
Glucose-Capillary: 96 mg/dL (ref 65–99)

## 2015-05-31 LAB — CULTURE, RESPIRATORY

## 2015-05-31 NOTE — Progress Notes (Signed)
SLP Cancellation Note  Patient Details Name: Frances Medina MRN: [id number] DOB: 04-15-1960   Cancelled treatment:       Reason Eval/Treat Not Completed: Other (comment). Plan for trach change tomorrow per notes. SLP will see as soon as possible tomorrow for PMSV trials and hopeful for swallow eval if change happens early enough. Pt may need MBS given dx of MG, which can only be done prior to 2 pm.    [name], [name] 05/31/2015, 11:35 AM

## 2015-05-31 NOTE — Progress Notes (Signed)
Occupational Therapy Treatment Patient Details Name: Frances Medina MRN: [id number] DOB: 11/01/1960 Today's Date: 05/31/2015    History of present illness pt presents with c/o difficulty swallowing for several days and SOB.  pt found to be in Myasthenia Gravis Flare up with recent diagnosis of Myasthenia Gravis in January and a flare up in February.  pt intubated on 3/15, extubated 3/18 with re-intubation on 3/18 and on vent for Eval. Attempted extubation 3/21 unsuccessfully and pt reintubated.  Trach placed on 3/22. Pt with hx of HTN, Depression, Anxiety, and Migraines.  pt currently undergoing work-up for Splenomegaly with Lymphadenopathy.   OT comments  Supervision for toileting, standing grooming and LB ADL. Pt able to cross her foot over opposite knee with relative ease to access feet.   Follow Up Recommendations  No OT follow up;Supervision/Assistance - 24 hour (initially)    Equipment Recommendations  None recommended by OT    Recommendations for Other Services      Precautions / Restrictions Precautions Precautions: Fall Precaution Comments: Deno Lunger, PANDA Restrictions Weight Bearing Restrictions: No       Mobility Bed Mobility               General bed mobility comments: pt in chair  Transfers Overall transfer level: Needs assistance Equipment used: None Transfers: Sit to/from Stand;Stand Pivot Transfers Sit to Stand: Supervision Stand pivot transfers: Supervision       General transfer comment: supervision for line management and safety only    Balance     Sitting balance-Leahy Scale: Good Sitting balance - Comments: able to perform seated LB bathing and dressing without LOB     Standing balance-Leahy Scale: Fair Standing balance comment: no support required for standing activities at sink                   ADL Overall ADL's : Needs assistance/impaired     Grooming: Wash/dry hands;Standing;Supervision/safety       Lower Body  Bathing: Supervison/ safety;Sit to/from stand       Lower Body Dressing: Supervision/safety;Sit to/from stand   Toilet Transfer: Supervision/safety;Ambulation;Comfort height toilet   Toileting- Clothing Manipulation and Hygiene: Supervision/safety;Sit to/from stand       Functional mobility during ADLs: Supervision/safety;Rolling walker General ADL Comments: Assist for line/02 tank only.      Vision                     Perception     Praxis      Cognition   Behavior During Therapy: WFL for tasks assessed/performed Overall Cognitive Status: Within Functional Limits for tasks assessed                       Extremity/Trunk Assessment               Exercises     Shoulder Instructions       General Comments      Pertinent Vitals/ Pain       Pain Assessment: No/denies pain  Home Living                                          Prior Functioning/Environment              Frequency Min 2X/week     Progress Toward Goals  OT Goals(current goals can now be found in the care  plan section)  Progress towards OT goals: Progressing toward goals  Acute Rehab OT Goals Time For Goal Achievement: 06/04/15 Potential to Achieve Goals: Good  Plan Discharge plan remains appropriate    Co-evaluation                 End of Session Equipment Utilized During Treatment: Oxygen;Gait belt   Activity Tolerance Patient tolerated treatment well   Patient Left in chair;with call bell/phone within reach   Nurse Communication          Time: [id number] OT Time Calculation (min): 21 min  Charges: OT General Charges $OT Visit: 1 Procedure OT Treatments $Self Care/Home Management : 8-22 mins  Malka So 05/31/2015, 2:39 PM  [phone]

## 2015-05-31 NOTE — Progress Notes (Signed)
Physical Therapy Treatment Patient Details Name: Frances Medina MRN: [id number] DOB: Jun 15, 1960 Today's Date: 05/31/2015    History of Present Illness pt presents with c/o difficulty swallowing for several days and SOB.  pt found to be in Myasthenia Gravis Flare up with recent diagnosis of Myasthenia Gravis in January and a flare up in February.  pt intubated on 3/15, extubated 3/18 with re-intubation on 3/18 and on vent for Eval. Attempted extubation 3/21 unsuccessfully and pt reintubated.  Trach placed on 3/22. Pt with hx of HTN, Depression, Anxiety, and Migraines.  pt currently undergoing work-up for Splenomegaly with Lymphadenopathy.    PT Comments    Patient doing well with mobility and gait.    Follow Up Recommendations  Home health PT;Supervision - Intermittent     Equipment Recommendations  None recommended by PT    Recommendations for Other Services       Precautions / Restrictions Precautions Precautions: Fall Precaution Comments: Trach Collar Restrictions Weight Bearing Restrictions: No    Mobility  Bed Mobility               General bed mobility comments: Patient in chair as PT entered room  Transfers Overall transfer level: Needs assistance Equipment used: Rolling walker (2 wheeled);None Transfers: Sit to/from [facility] Sit to Stand: Supervision Stand pivot transfers: Supervision       General transfer comment: Supervision for safety only.  Patient transferred recliner <> BSC x 2.  Sit <> stand with no physical assist.  Ambulation/Gait Ambulation/Gait assistance: Supervision (Assist for lines/O2) Ambulation Distance (Feet): 400 Feet Assistive device: Rolling walker (2 wheeled) Gait Pattern/deviations: Step-through pattern;Decreased stride length Gait velocity: decreased Gait velocity interpretation: Below normal speed for age/gender General Gait Details: Safe use of RW.  Patient with 1 standing rest break during gait.      Stairs            Wheelchair Mobility    Modified Rankin (Stroke Patients Only)       Balance                                    Cognition Arousal/Alertness: Awake/alert Behavior During Therapy: WFL for tasks assessed/performed Overall Cognitive Status: Within Functional Limits for tasks assessed                      Exercises      General Comments        Pertinent Vitals/Pain Pain Assessment: No/denies pain    Home Living                      Prior Function            PT Goals (current goals can now be found in the care plan section) Progress towards PT goals: Progressing toward goals    Frequency  Min 3X/week    PT Plan Current plan remains appropriate    Co-evaluation             End of Session Equipment Utilized During Treatment: Gait belt;Oxygen Activity Tolerance: Patient tolerated treatment well Patient left: in chair;with call bell/phone within reach     Time: 1138-1221 minus 15 min for nursing  PT Time Calculation (min) (ACUTE ONLY): 43 min - 15 min = 28 min total  Charges:  $Gait Training: 23-37 mins  G Codes:      Frances Medina 05/31/2015, 12:52 PM Frances Medina. Sanjuana Kava, [place] Pager [phone]

## 2015-05-31 NOTE — Progress Notes (Signed)
PULMONARY / CRITICAL CARE MEDICINE   Name: Frances Medina MRN: [id number] DOB: 1960/03/18    ADMISSION DATE:  05/15/2015 CONSULTATION DATE: 05/16/2015  REFERRING MD:   CHIEF COMPLAINT: Weakness, dysphagia, SOB in setting of known MG   HISTORY OF PRESENT ILLNESS:  55 y.o. female presenting to ED on 3/14 with increased weakness, SOB, and dysphagia over the last 7 d PTA.. Of note, she was recently diagnosed with myasthenia gravis during hospitalization in January 2017, during which time she required treatment with IVIG and was started on pyridostigmine. She had another hospitalization for MG flare in February, also requiring treatment with IVIG and steroid taper, thought to have been triggered by a UTI. She was supposed to have continued prednisone upon last hospital discharge 04/13/15, but patient was not aware that she had a prescription. She called her neurologist Dr. Audelia Acton last Friday about her dysphagia, and he recommended increasing frequency of pyridostigmine 60 mg to q6h from q8h. He also noted that patient would need to start steroids or be seen in the ED. Patient had an appointment to see him 05/16/15 with plans to start prednisone, but with increasing fatigue, she decided to be evaluated in the ED. Swallowing has become more difficult since last Friday, 05/11/15. She was only able to eat "a few nuggets" today. She was, however, able to take all but her evening dose of pyridostigmine. She has been able to drink fluids but not as much as usual. She and her husband report that she has trouble with her speech if she talks too much. She also complains of chest pain under her breast that feels like "it's swollen" and cuts off her breath since yesterday. She has had rhinorrhea with post-nasal drip and cough in the middle of the night since last weak but has not taken any new OTC medications. No sick contacts.In the ED on 3/14, patient's vital signs were stable, and SpO2 was 99% on room air. She was to be  admitted to the ICU. Since presentation her swallowing fxn declined further w/ difficulty swallowing oral secretions. A NIF was recorded at -25, and her VC was 535ml. PCCM was consulted given concern for impending respiratory failure.   SUBJECTIVE: No acute events overnight. Patient remains on T-collar. Denies any dyspnea. Denies any chest pain or pressure.  REVIEW OF SYSTEMS: No fever, chills, or sweats. No nausea or abdominal pain.  VITAL SIGNS: BP 111/71 mmHg  Pulse 83  Temp(Src) 98.4 F (36.9 C) (Oral)  Resp 18  Ht 5' (1.524 m)  Wt 257 lb 14.4 oz (116.983 kg)  BMI 50.37 kg/m2  SpO2 100%  HEMODYNAMICS:    VENTILATOR SETTINGS: Vent Mode:  [-]  FiO2 (%):  [28 %] 28 %  INTAKE / OUTPUT: I/O last 3 completed shifts: In: 2810 [NG/GT:2210; IV Piggyback:600] Out: -   PHYSICAL EXAMINATION: General:  Sitting up in chair. No distress. Awake. Integument:  Warm & dry. No rash on exposed skin.  HEENT:  No scleral injection. Tracheostomy in place.  Cardiovascular:  Regular rate. No edema. No appreciable JVD.  Pulmonary:  Clear bilaterally to auscultation. Normal work of breathing on tracheostomy collar. Abdomen: Soft. Normal bowel sounds. Nondistended.  Neurological:  Nonfocal. CN grossly in tact.  LABS:  BMET  Recent Labs Lab 05/29/15 0310 05/30/15 0250 05/31/15 0622  NA 144 144 142  K 3.4* 3.9 3.5  CL 106 110 105  CO2 29 28 29   BUN 23* 19 16  CREATININE 0.45 0.41* 0.40*  GLUCOSE 129*  124* 126*    Electrolytes  Recent Labs Lab 05/29/15 0310 05/30/15 0250 05/31/15 0622  CALCIUM 8.9 8.8* 9.0  MG 2.3 2.2 2.0  PHOS 2.7 2.5 3.4    CBC  Recent Labs Lab 05/29/15 0310 05/30/15 0250 05/31/15 0622  WBC 19.4* 14.1* 11.4*  HGB 10.8* 9.9* 9.7*  HCT 35.1* 32.6* 31.5*  PLT 403* 382 342    Coag's No results for input(s): APTT, INR in the last 168 hours.  Sepsis Markers No results for input(s): LATICACIDVEN, PROCALCITON, O2SATVEN in the last 168  hours.  ABG No results for input(s): PHART, PCO2ART, PO2ART in the last 168 hours.  Liver Enzymes  Recent Labs Lab 05/29/15 0310 05/30/15 0250 05/31/15 0622  ALBUMIN 2.7* 2.4* 2.5*    Cardiac Enzymes No results for input(s): TROPONINI, PROBNP in the last 168 hours.  Glucose  Recent Labs Lab 05/30/15 1131 05/30/15 1603 05/30/15 2010 05/31/15 0024 05/31/15 0602 05/31/15 0753  GLUCAP 109* 123* 109* 106* 100* 107*    Imaging No results found.   STUDIES:  CXR 3/14 >> Hypoinflation of the lungs with mild bibasilar subsegmental atelectasis  Abd X-ray 3/21: Feeding tube with tip in distal duodenum. Port CXR 3/22: Right internal jugular central venous catheter in good position. Endotracheal tube in good position. Low lung volumes. Minimal basilar atelectasis. Port CXR 3/22:  Trach in place w/ tip approximately 2.5cm above carina. No new opacity. R IJ CVL in place. Bilateral effusions right greater than left.  MICROBIOLOGY: Urine Ctx 3/14:  E coli MRSA Nasal Ctx 3/15:  Negative   ANTIBIOTICS: Bactrim 3/18 - 3/20   SIGNIFICANT EVENTS: 2/10 >> D/C from hospital  3/13 >> Onset of severe SOB and weakness  3/14 >> Present to ED  3/15 >> IVIG and ETT  3/18 >> Unsuccessful extubation , reintubated.  3/21 >> Unsuccessful extubation, reintubated 3/22 >> Trach placed by JY  LINES/TUBES: Shiley #6 Cuffed (Placed by Hyman Bible) 3/22>> R IJ CVL 3/15 >>  L Nare NGT 3/21 - 3/25; 3/25>>> OETT 7.0 3/15 - 3/18; 3/18 - 3/21; 3/21 - 3/22  ASSESSMENT / PLAN:  55 y.o. Female s/p acute respiratory failure due to flare of myasthenia gravis. S/P tracheostomy 3/22 by Dr. Nelda Marseille. Plan to change tracheostomy tomorrow depending on continued stability.   Sonia Baller Ashok Cordia, M.D. [facility] Pager:  [phone] After 3pm or if no response, call [phone]  05/31/2015, 11:04 AM

## 2015-05-31 NOTE — Progress Notes (Signed)
05/31/2015- Respiratory care note- Pt has a #6 shiley cuffed trach in place.  PT for a trach change possibly today or tomorrow per MD notes.  Pt tolerating trach well and is on 28% humidity with sats in the normal range.  Pt has a mod to large amt of secretions.  Trach supplies at bedside.  Will cont to monitor progress.

## 2015-05-31 NOTE — Progress Notes (Signed)
Family Medicine Teaching Service Daily Progress Note Intern Pager: [phone]  Patient name: Frances Medina Medical record number: [id number] Date of birth: Sep 07, 1960 Age: 55 y.o. Gender: female  Primary Care Provider: Andrena Mews, MD Consultants: Pulmonology, Neurology Code Status: Full  Pt Overview and Major Events to Date:  2/10 >> D/C from hospital  3/13 >> Onset of severe SOB and weakness  3/14 >> Presented to ED  3/15 >> Transferred to Neuro ICU, IVIG and ETT  3/18 >> Unsuccessful extubation , reintubated.  3/19 >> Completed 5 days IVIG 3/21 >> Unsuccessful extubation, reintubated 3/22 >> Trach placed, solu-medrol 3/22  3/27 >> Neurology signed off 3/28 >> Began IV vancomycin and imipenem  Assessment and Plan: Frances Medina is a 55 y.o. female presenting with myasthenia gravis exacerbation. PMH is significant for HTN,uterine fibroids, depression, anxiety, migraines and recently diagnosed myasthenia gravis, and undergoing work-up of splenomegaly with lymphadenopathy.   Myasthenia Gravis Exacerbation: Unclear trigger of symptoms, though discontinuation of steroids is most likely cause. Rhinorrhea but no fevers or leukocytosis. UA was positive for nitrites and trace leukocytes. Initial plan was to admit to SDU on continuous cardiopulmonary monitoring. However, initial NIF score was -25, so patient was transferred to ICU and intubated. Follows with Neurologist Dr. Audelia Acton of [facility]. She has tolerated trach collar for > 72 hours now.  - Neurology consulting, appreciate recommendations: s/p IV solumedrol and 5 days IVIG; did not recommend further steroids - Pulmonology following respiratory secretions and hopeful to downsize trach, otherwise feeding tube may be needed in next couple of days --> Trach to be replaced with uncuffed 05/31/15 - s/p perc trach on 28% FiO2; appreciate pulmonology recommendations - Tube feedings - Pyridostigmine 60 mg qid (increased from TID  prior to admission) - PT/OT consulted --> HH PT, intermittent supervision - Continue home prn albuterol for wheezing/SOB - Drugs to avoid in MG: aminoglycosides, clindamycin, fluoroquinolones, ketolides, vancomycin, beta blockers, procainamide, quinidine, anti-PD-1 monoclonal antibodies, botox, chloroquine, hydroxychloroquine, magnesium, penicillamine, quinine  Presumed VAP: Will treat as VAP, given changes in vital signs and increased respiratory secretions. CXR 3/27 worse compared to 3/22 with decreased lung volume, possibly worsened bilateral pleural effusions and opacities concerning for infection vs. atelectasis. White count continues to improve with treatment 11.4 today (05/31/15) > 14.1 > 19.4. - Continue IV imipenem/vanc (3/28 >>) - Obtain tracheal aspirate --> normal oropharyngeal flora  Feeds:  - Currently getting NG feeds - Plan to have repeat swallow study 3/31; may need MBS per SLP (can only be done before 2 pm)  Vital sign instability: Blood pressures improved and not tachycardic overnight. Still afebrile.  - Obtain blood cultures --> NGTD  Diarrhea: Improving - C. Diff panel resulted as antibody positive, toxin negative, so will not start flagyl  HFpEF: ECHO 04/09/15 performed due to SOB showed EF of 60-65% and G1DD. Patient currently appears euvolemic.  Splenomegaly w/Lymphadenopathy: Being followed by Oncologist Dr. Irene Limbo. Work-up thus far has shown HIV neg, mono screen neg, elevated LDH at 395 and low haptoglobin at <10 (with recent IVIG treatment). To repeat hemolytic markers, CBC and Coombs' test later this month at follow-up. No hypermetabolic masses or lymphadenopathy seen on PET scan 05/14/15. - Continue to monitor CBCs  Hx of HTN: 111/71 this a.m. - Not on any home anti-hypertensives - Continue to monitor  Depression and Anxiety: - Zoloft 50 mg daily daily - Klonopin 0.5 mg BID  E. Coli UTI: s/p 3 days bactrim  FEN/GI: Tube feeds, pepcid Prophylaxis:  Heparin  Disposition: Plan to decrease size of tracheostomy tube if secretions improve, if not may need feeding tube; anticipate dispo to in-hospital LTAC   Subjective:  Patient feels her secretions have decreased. She reports that she is "working hard to get out" and suctioning herself frequently.    Objective: Temp:  [98 F (36.7 C)-98.6 F (37 C)] 98 F (36.7 C) (03/30 0500) Pulse Rate:  [80-108] 98 (03/30 0500) Resp:  [18-28] 18 (03/30 0500) BP: (119-161)/(75-108) 119/75 mmHg (03/30 0500) SpO2:  [96 %-100 %] 98 % (03/30 0500) FiO2 (%):  [28 %] 28 % (03/30 0500) Weight:  [257 lb 14.4 oz (116.983 kg)] 257 lb 14.4 oz (116.983 kg) (03/30 0500) Physical Exam: General: Obese female, resting in recliner, awake and alert Cardiovascular: RRR, S1, S2, no m/r/g Chest: CTAB with few transmitted upper airway noises, trach collar in place Abdomen: +BS, soft, NT, ND Extremities: Moves all spontaneously, Strength 5-/5 of LE and 5/5 UEs  Laboratory:  Recent Labs Lab 05/29/15 0310 05/30/15 0250 05/31/15 0622  WBC 19.4* 14.1* 11.4*  HGB 10.8* 9.9* 9.7*  HCT 35.1* 32.6* 31.5*  PLT 403* 382 342    Recent Labs Lab 05/29/15 0310 05/30/15 0250 05/31/15 0622  NA 144 144 142  K 3.4* 3.9 3.5  CL 106 110 105  CO2 _0 BUN 23* 19 16  CREATININE 0.45 0.41* 0.40*  CALCIUM 8.9 8.8* 9.0  GLUCOSE 129* 124* 126*    Imaging/Diagnostic Tests: Dg Chest 1 View  05/28/2015  CLINICAL DATA:  Patient with respiratory failure. Shortness of breath. EXAM: CHEST 1 VIEW COMPARISON:  Chest radiograph 05/23/2015 FINDINGS: Tracheostomy tube terminates in the mid trachea. Enteric tube courses inferior to the diaphragm. Right upper extremity PICC line is present with tip projecting over the right atrium. Low lung volumes. Stable cardiac and mediastinal contours. Small bilateral pleural effusions. Bibasilar airspace opacities. No pneumothorax. IMPRESSION: Right upper extremity PICC line is present with tip  projecting over the right atrium. Low lung volumes, small bilateral pleural effusions and underlying opacities favored to represent atelectasis or infection. Associated interstitial pulmonary edema. Electronically Signed   By: Lovey Newcomer M.D.   On: 05/28/2015 13:06   Dg Chest 2 View  05/15/2015  CLINICAL DATA:  Shortness of breath. EXAM: CHEST  2 VIEW COMPARISON:  April 07, 2015. FINDINGS: The heart size and mediastinal contours are within normal limits. Hypoinflation of the lungs is noted. Mild bibasilar subsegmental atelectasis is noted. No pneumothorax or pleural effusion is noted. The visualized skeletal structures are unremarkable. IMPRESSION: Hypoinflation of the lungs with mild bibasilar subsegmental atelectasis. Electronically Signed   By: Marijo Conception, M.D.   On: 05/15/2015 21:06   Nm Pet Image Initial (pi) Skull Base To Thigh  05/14/2015  CLINICAL DATA:  Initial treatment strategy for lymphadenopathy and splenomegaly. Possibly lymphoproliferative disease. Myasthenia gravis. EXAM: NUCLEAR MEDICINE PET SKULL BASE TO THIGH TECHNIQUE: 14.8 mCi F-18 FDG was injected intravenously. Full-ring PET imaging was performed from the skull base to thigh after the radiotracer. CT data was obtained and used for attenuation correction and anatomic localization. FASTING BLOOD GLUCOSE:  Value: 97 mg/dl COMPARISON:  Chest CTA on 04/07/2015 FINDINGS: NECK No hypermetabolic lymph nodes in the neck. CHEST No hypermetabolic mediastinal or hilar nodes. No suspicious pulmonary nodules on the CT scan. ABDOMEN/PELVIS No abnormal hypermetabolic activity within the liver, pancreas, adrenal glands, or spleen. No evidence of splenomegaly. No hypermetabolic lymph nodes in the abdomen or pelvis. SKELETON No focal hypermetabolic activity to  suggest skeletal metastasis. Mild diffusely increased hypermetabolic activity seen throughout the axial and appendicular bone marrow. IMPRESSION: No hypermetabolic masses or lymphadenopathy  identified. Mild diffusely increased metabolic activity throughout the bone marrow, without focal metastatic lesions. This finding is of uncertain clinical significance, and is likely due to marrow stimulation. Bone marrow biopsy could be considered further evaluation if clinically warranted. Electronically Signed   By: Earle Gell M.D.   On: 05/14/2015 16:33   Dg Chest Port 1 View  05/23/2015  CLINICAL DATA:  Tracheostomy placement. EXAM: PORTABLE CHEST 1 VIEW COMPARISON:  Study obtained earlier in the day FINDINGS: There is now a tracheostomy present with the tracheostomy catheter tip 2.4 cm above the carina. Feeding tube tip is below the diaphragm. Central catheter tip is at the cavoatrial junction. No pneumothorax. There are bilateral pleural effusions, larger on the right than on the left. There is atelectatic change in the bases. Heart is mildly enlarged. The pulmonary vascularity appears normal. No adenopathy. No bone lesions. IMPRESSION: Tube and catheter positions as described without pneumothorax. Mild cardiomegaly with bilateral pleural effusions. Suspect a degree of congestive heart failure. There is bibasilar atelectasis. Electronically Signed   By: Lowella Grip III M.D.   On: 05/23/2015 16:17   Portable Chest Xray  05/23/2015  CLINICAL DATA:  55 year old female female with respiratory failure. Myasthenia gravis. Initial encounter. EXAM: PORTABLE CHEST 1 VIEW COMPARISON:  05/22/2015 and earlier. FINDINGS: Portable AP semi upright view at 0604 hours. Stable endotracheal tube tip at the level the clavicles. Enteric tube now in place, courses to the abdomen and tip not included. Multiple EKG leads and wires also overlie the chest. Stable right IJ central line. Continued low lung volumes with right greater than left basilar veiling opacity. No pneumothorax. No acute pulmonary edema. Mediastinal contours remain within normal limits. IMPRESSION: 1. Enteric tube placed and courses to the abdomen,  tip not included. Otherwise, stable lines and tubes. 2. Stable ventilation. Low lung volumes with bibasilar opacity probably reflecting atelectasis and small effusions. Electronically Signed   By: Genevie Ann M.D.   On: 05/23/2015 07:36   Portable Chest Xray  05/22/2015  CLINICAL DATA:  Re-intubation today EXAM: PORTABLE CHEST 1 VIEW COMPARISON:  05/22/2015 FINDINGS: Unchanged position of endotracheal tube and internal jugular central line. Cardiac enlargement stable. Bibasilar hypoventilatory change with low lung volumes. IMPRESSION: Bilateral lower lobe atelectasis. Findings similar to prior study, with mildly increased opacity at the bases. Electronically Signed   By: Skipper Cliche M.D.   On: 05/22/2015 12:14   Dg Chest Port 1 View  05/22/2015  CLINICAL DATA:  Acute respiratory failure EXAM: PORTABLE CHEST 1 VIEW COMPARISON:  05/21/15 FINDINGS: Cardiomediastinal silhouette is stable. Endotracheal tube in place with tip 2.5 cm above the carina. NG tube in place. Right IJ central line is unchanged in position. Central mild vascular congestion without convincing pulmonary edema persistent mild basilar atelectasis. No segmental infiltrate. IMPRESSION: Stable support apparatus. Central mild vascular congestion without convincing pulmonary edema. Persistent bilateral basilar atelectasis. Electronically Signed   By: Lahoma Crocker M.D.   On: 05/22/2015 07:50   Dg Chest Port 1 View  05/21/2015  CLINICAL DATA:  Acute respiratory failure EXAM: PORTABLE CHEST 1 VIEW COMPARISON:  05/19/2015 FINDINGS: Endotracheal tube terminates 5 cm above the carina. Mild bibasilar opacities, likely atelectasis. No frank interstitial edema. No pleural effusion or pneumothorax. The heart is normal in size. Right IJ venous catheter terminates the cavoatrial junction. Enteric tube courses into the mid stomach. IMPRESSION:  Endotracheal tube terminates 5 cm above the carina. Mild bibasilar opacities, likely atelectasis. No frank interstitial  edema. Electronically Signed   By: Julian Hy M.D.   On: 05/21/2015 10:45   Dg Chest Port 1 View  05/19/2015  CLINICAL DATA:  Hypoxia EXAM: PORTABLE CHEST 1 VIEW COMPARISON:  May 19, 2015 study obtained earlier in the day FINDINGS: Endotracheal tube tip is 9 mm above the carina. Central catheter tip is at the cavoatrial junction. Nasogastric tube tip and side port are below the diaphragm in the stomach. No pneumothorax. There is no edema or consolidation. Heart size and pulmonary vascularity are within normal limits. No adenopathy. No bone lesions apparent. IMPRESSION: Tube and catheter positions as described without pneumothorax. Note that the endotracheal tube tip is close to the carina. It may be prudent to withdrawal endotracheal tube approximately 3 cm. No edema or consolidation. These results will be called to the ordering clinician or representative by the Radiologist Assistant, and communication documented in the PACS or zVision Dashboard. Electronically Signed   By: Lowella Grip III M.D.   On: 05/19/2015 11:16   Dg Chest Port 1 View  05/19/2015  CLINICAL DATA:  Acute respiratory failure EXAM: PORTABLE CHEST 1 VIEW COMPARISON:  May 18, 2015 FINDINGS: The ETT is in good position. No other change in support apparatus. No pneumothorax. Bibasilar atelectasis, right greater than left. No other interval changes. IMPRESSION: The ETT is in good position.  No other change. Electronically Signed   By: Dorise Bullion III M.D   On: 05/19/2015 08:11   Dg Chest Port 1 View  05/18/2015  CLINICAL DATA:  Acute respiratory failure.  Endotracheal tube. EXAM: PORTABLE CHEST 1 VIEW COMPARISON:  05/16/2015 FINDINGS: Endotracheal tube terminates approximately 2.5 cm above the carina. Right jugular central venous catheter terminates over the cavoatrial junction. Enteric tube courses into the left upper abdomen with tip not imaged. Lung volumes remain diminished with persistent mild patchy opacities in both  lung bases. Trace bilateral pleural effusions are questioned. No pneumothorax. IMPRESSION: 1. Support devices as above. 2. Low lung volumes with persistent mild bibasilar opacities which may reflect atelectasis. Electronically Signed   By: Logan Bores M.D.   On: 05/18/2015 08:21   Dg Chest Portable 1 View  05/16/2015  CLINICAL DATA:  Status post central line placement EXAM: PORTABLE CHEST 1 VIEW COMPARISON:  05/15/2015 FINDINGS: Cardiac shadow is stable. An endotracheal tube is now seen and lies just above the carina and should be withdrawn 1-2 cm. A nasogastric catheter is seen coiled within the stomach. A right jugular central line is noted at the cavoatrial junction. No pneumothorax is seen. Bilateral pleural effusions are noted with bibasilar patchy opacities. No bony abnormality is seen. IMPRESSION: Endotracheal tube at the level of the carina and should be withdrawn 1-2 cm. No pneumothorax following central line placement. Small bilateral pleural effusions and bibasilar patchy opacities. These results will be called to the ordering clinician or representative by the Radiologist Assistant, and communication documented in the PACS or zVision Dashboard. Electronically Signed   By: Inez Catalina M.D.   On: 05/16/2015 13:35   Dg Abd Portable 1v  05/26/2015  CLINICAL DATA:  Encounter for NG tube placement EXAM: PORTABLE ABDOMEN - 1 VIEW COMPARISON:  None. FINDINGS: The feeding tube extends into the stomach. The tip is within the fourth portion duodenum not changed from prior. IMPRESSION: No change in position of feeding tube. Electronically Signed   By: Helane Gunther.D.  On: 05/26/2015 15:31   Dg Abd Portable 1v  05/22/2015  CLINICAL DATA:  Feeding tube placement EXAM: PORTABLE ABDOMEN - 1 VIEW COMPARISON:  05/19/2015 FINDINGS: NG tube is been exchange for a feeding tube. Feeding tube tip is in the distal fourth portion of the duodenum. Normal bowel gas pattern. No significant dilatation or  obstruction. No acute osseous finding. IMPRESSION: Feeding tube tip distal duodenum. Electronically Signed   By: Jerilynn Mages.  Shick M.D.   On: 05/22/2015 15:29   Dg Abd Portable 1v  05/19/2015  CLINICAL DATA:  Evaluate OG tube. EXAM: PORTABLE ABDOMEN - 1 VIEW COMPARISON:  May 16, 2015 FINDINGS: The OG tube terminates in the distal stomach. No other acute abnormalities identified. IMPRESSION: Appropriate placement of OG tube. Electronically Signed   By: Dorise Bullion III M.D   On: 05/19/2015 11:17   Dg Abd Portable 1v  05/16/2015  CLINICAL DATA:  OG tube placement EXAM: PORTABLE ABDOMEN - 1 VIEW COMPARISON:  None. FINDINGS: Enteric tube is in place with the tip in the very pyloric region and the side port in the distal stomach. Nonobstructive bowel gas pattern. IMPRESSION: Enteric tube tip in the peripyloric region. Electronically Signed   By: Rolm Baptise M.D.   On: 05/16/2015 13:34    Hillary Corinda Gubler, MD 05/31/2015, 7:42 AM PGY-1, [place] Intern pager: [phone], text pages welcome

## 2015-06-01 ENCOUNTER — Inpatient Hospital Stay (HOSPITAL_COMMUNITY): Payer: BC Managed Care – PPO

## 2015-06-01 LAB — CBC WITH DIFFERENTIAL/PLATELET
Basophils Absolute: 0 10*3/uL (ref 0.0–0.1)
Basophils Relative: 0 %
Eosinophils Absolute: 0 10*3/uL (ref 0.0–0.7)
Eosinophils Relative: 0 %
HCT: 31.5 % — ABNORMAL LOW (ref 36.0–46.0)
Hemoglobin: 9.8 g/dL — ABNORMAL LOW (ref 12.0–15.0)
Lymphocytes Relative: 19 %
Lymphs Abs: 2.1 10*3/uL (ref 0.7–4.0)
MCH: 25.6 pg — ABNORMAL LOW (ref 26.0–34.0)
MCHC: 31.1 g/dL (ref 30.0–36.0)
MCV: 82.2 fL (ref 78.0–100.0)
Monocytes Absolute: 0.5 10*3/uL (ref 0.1–1.0)
Monocytes Relative: 5 %
Neutro Abs: 8.4 10*3/uL — ABNORMAL HIGH (ref 1.7–7.7)
Neutrophils Relative %: 76 %
Platelets: 321 10*3/uL (ref 150–400)
RBC: 3.83 MIL/uL — ABNORMAL LOW (ref 3.87–5.11)
RDW: 18.4 % — ABNORMAL HIGH (ref 11.5–15.5)
WBC: 11.1 10*3/uL — ABNORMAL HIGH (ref 4.0–10.5)

## 2015-06-01 LAB — RENAL FUNCTION PANEL
Albumin: 2.6 g/dL — ABNORMAL LOW (ref 3.5–5.0)
Anion gap: 6 (ref 5–15)
BUN: 14 mg/dL (ref 6–20)
CO2: 30 mmol/L (ref 22–32)
Calcium: 8.8 mg/dL — ABNORMAL LOW (ref 8.9–10.3)
Chloride: 103 mmol/L (ref 101–111)
Creatinine, Ser: 0.49 mg/dL (ref 0.44–1.00)
GFR calc Af Amer: >60 mL/min (ref >60)
GFR calc non Af Amer: >60 mL/min (ref >60)
Glucose, Bld: 103 mg/dL — ABNORMAL HIGH (ref 65–99)
Phosphorus: 3.2 mg/dL (ref 2.5–4.6)
Potassium: 3.6 mmol/L (ref 3.5–5.1)
Sodium: 139 mmol/L (ref 135–145)

## 2015-06-01 LAB — MAGNESIUM: Magnesium: 1.9 mg/dL (ref 1.7–2.4)

## 2015-06-01 LAB — GLUCOSE, CAPILLARY
Glucose-Capillary: 102 mg/dL — ABNORMAL HIGH (ref 65–99)
Glucose-Capillary: 108 mg/dL — ABNORMAL HIGH (ref 65–99)
Glucose-Capillary: 112 mg/dL — ABNORMAL HIGH (ref 65–99)
Glucose-Capillary: 85 mg/dL (ref 65–99)
Glucose-Capillary: 93 mg/dL (ref 65–99)
Glucose-Capillary: 96 mg/dL (ref 65–99)
Glucose-Capillary: 98 mg/dL (ref 65–99)

## 2015-06-01 MED ORDER — LORAZEPAM 2 MG/ML IJ SOLN
0.5000 mg | Freq: Once | INTRAMUSCULAR | Status: AC
  Administered 2015-06-01: 0.5 mg via INTRAVENOUS
  Filled 2015-06-01: qty 1

## 2015-06-01 NOTE — Progress Notes (Signed)
Pharmacy Antibiotic Note  Frances Medina is a 55 y.o. female admitted on 05/15/2015 with   increased weakness, SOB, and dysphagia and noted with  pneumonia and myasthenia gravis  exacerbation.  Pharmacy has been consulted for  primaxin dosing (PCN and cipro allergy noted). -WBC= 11.1, afeb, and CrCl ~ 95  Plan: -Continue imipenem 500mg  IV q6h (consider a total of 7 days treatment) -Will follow renal function, cultures and clinical progress   Height: 5' (152.4 cm) Weight: 258 lb 6.4 oz (117.209 kg) IBW/kg (Calculated) : 45.5  Temp (24hrs), Avg:98.3 F (36.8 C), Min:98.1 F (36.7 C), Max:98.6 F (37 C)   Recent Labs Lab 05/28/15 0500 05/29/15 0310 05/30/15 0250 05/31/15 0622 06/01/15 0601  WBC 15.8* 19.4* 14.1* 11.4* 11.1*  CREATININE 0.48 0.45 0.41* 0.40* 0.49    Estimated Creatinine Clearance: 94.2 mL/min (by C-G formula based on Cr of 0.49).    Allergies  Allergen Reactions  . Dicyclomine Hives  . Methocarbamol Hives  . Penicillins Hives    Has patient had a PCN reaction causing immediate rash, facial/tongue/throat swelling, SOB or lightheadedness with hypotension: Yes Has patient had a PCN reaction causing severe rash involving mucus membranes or skin necrosis: No Has patient had a PCN reaction that required hospitalization No Has patient had a PCN reaction occurring within the last 10 years: No If all of the above answers are "NO", then may proceed with Cephalosporin use.   . Ciprofloxacin     Rash, shortness of breath  . Shrimp [Shellfish Allergy]     Antimicrobials this admission: Septra 3/18>>3/20 Vanc 3/28>> 3/30 Primaxin 3/28>>  Microbiology results: 3/28 resp- normal flora 3/28 BCx: ngtd 3/14 UCx: ecoli (R to cipro only) 3/15 MRSA PCR: neg C diff anttibody +; toxin neg  Thank you for allowing pharmacy to be a part of this patient's care.  Hildred Laser, Pharm D 06/01/2015 2:00 PM

## 2015-06-01 NOTE — Progress Notes (Signed)
Family Medicine Teaching Service Daily Progress Note Intern Pager: [phone]  Patient name: Frances Medina Medical record number: [id number] Date of birth: 1961/01/23 Age: 55 y.o. Gender: female  Primary Care Provider: Andrena Mews, MD Consultants: Pulmonology, Neurology Code Status: Full  Pt Overview and Major Events to Date:  2/10 >> D/C from hospital  3/13 >> Onset of severe SOB and weakness  3/14 >> Presented to ED  3/15 >> Transferred to Neuro ICU, IVIG and ETT  3/18 >> Unsuccessful extubation , reintubated.  3/19 >> Completed 5 days IVIG 3/21 >> Unsuccessful extubation, reintubated 3/22 >> Trach placed, solu-medrol 3/22  3/27 >> Neurology signed off 3/28 >> Began IV vancomycin and imipenem 3/30 >> Vancomycin discontinued as trach with just oral flora 3/31 >> Lost NG tube this morning; will need to replace  Assessment and Plan: Frances Medina is a 55 y.o. female presenting with myasthenia gravis exacerbation. PMH is significant for HTN,uterine fibroids, depression, anxiety, migraines and recently diagnosed myasthenia gravis, and undergoing work-up of splenomegaly with lymphadenopathy.   Myasthenia Gravis Exacerbation: Unclear trigger of symptoms, though discontinuation of steroids is most likely cause. Rhinorrhea but no fevers or leukocytosis. UA was positive for nitrites and trace leukocytes. Initial plan was to admit to SDU on continuous cardiopulmonary monitoring. However, initial NIF score was -25, so patient was transferred to ICU and intubated. Follows with Neurologist Dr. Audelia Acton of [facility]. She has tolerated trach collar for > 72 hours now.  - Neurology consulting, appreciate recommendations: s/p IV solumedrol and 5 days IVIG; did not recommend further steroids - Pulmonology following respiratory secretions and hopeful to downsize trach, otherwise feeding tube may be needed in next couple of days --> Trach to be replaced with uncuffed 06/01/15 - s/p perc trach  on 28% FiO2; appreciate pulmonology recommendations - Tube feedings - Consult core track for replacement of NG tube as patient needing pyridostigmine 60 mg qid (increased from TID prior to admission) - PT/OT consulted --> HH PT, intermittent supervision - Continue home prn albuterol for wheezing/SOB - Drugs to avoid in MG: aminoglycosides, clindamycin, fluoroquinolones, ketolides, vancomycin, beta blockers, procainamide, quinidine, anti-PD-1 monoclonal antibodies, botox, chloroquine, hydroxychloroquine, magnesium, penicillamine, quinine  Presumed VAP: Will treat as VAP, given changes in vital signs and increased respiratory secretions. CXR 3/27 worse compared to 3/22 with decreased lung volume, possibly worsened bilateral pleural effusions and opacities concerning for infection vs. atelectasis. White count continues to improve with treatment 11.1 today (06/01/15) > 11.4 > 14.1 > 19.4. - Continue IV imipenem through weekend - Vanc (3/28 >> 3/30) - Obtain tracheal aspirate --> normal oropharyngeal flora  Feeds:  - Currently getting NG feeds - Plan to have repeat swallow study 3/31; may need MBS per SLP (can only be done before 2 pm)  Vital sign instability: Blood pressures improved and not tachycardic overnight. Still afebrile.  - Obtain blood cultures --> NGTD  Diarrhea: Improving - C. Diff panel resulted as antibody positive, toxin negative, so will not start flagyl  HFpEF: ECHO 04/09/15 performed due to SOB showed EF of 60-65% and G1DD. Patient currently appears euvolemic.  Splenomegaly w/Lymphadenopathy: Being followed by Oncologist Dr. Irene Limbo. Work-up thus far has shown HIV neg, mono screen neg, elevated LDH at 395 and low haptoglobin at <10 (with recent IVIG treatment). To repeat hemolytic markers, CBC and Coombs' test later this month at follow-up. No hypermetabolic masses or lymphadenopathy seen on PET scan 05/14/15. - Continue to monitor CBCs  Hx of HTN: 111/71 this a.m. - Not  on any  home anti-hypertensives - Continue to monitor  Depression and Anxiety: - Zoloft 50 mg daily daily - Klonopin 0.5 mg BID  E. Coli UTI: s/p 3 days bactrim  FEN/GI: Tube feeds, pepcid Prophylaxis: Heparin  Disposition: Plan to decrease size of tracheostomy tube if secretions improve, if not may need feeding tube; anticipate dispo to in-hospital LTAC (will touch base with CM)  Subjective:  Patient denies increased weakness with mestinon missed dose.   Objective: Temp:  [98.1 F (36.7 C)-98.6 F (37 C)] 98.1 F (36.7 C) (03/31 0338) Pulse Rate:  [82-98] 82 (03/31 0338) Resp:  [18-20] 18 (03/31 0338) BP: (111-125)/(66-71) 125/66 mmHg (03/31 0338) SpO2:  [98 %-100 %] 99 % (03/31 0338) FiO2 (%):  [28 %] 28 % (03/31 0338) Weight:  [258 lb 6.4 oz (117.209 kg)] 258 lb 6.4 oz (117.209 kg) (03/31 2415) Physical Exam: General: Obese female, resting in recliner, awake and alert Cardiovascular: RRR, S1, S2, no m/r/g Chest: CTAB with few transmitted upper airway noises, trach collar in place Abdomen: +BS, soft, NT, ND Extremities: Moves all spontaneously, Strength 5-/5 of LE and 5/5 UEs  Laboratory:  Recent Labs Lab 05/29/15 0310 05/30/15 0250 05/31/15 0622  WBC 19.4* 14.1* 11.4*  HGB 10.8* 9.9* 9.7*  HCT 35.1* 32.6* 31.5*  PLT 403* 382 342    Recent Labs Lab 05/30/15 0250 05/31/15 0622 06/01/15 0601  NA 144 142 139  K 3.9 3.5 3.6  CL 110 105 103  CO2 _0 BUN _1 CREATININE 0.41* 0.40* 0.49  CALCIUM 8.8* 9.0 8.8*  GLUCOSE 124* 126* 103*    Imaging/Diagnostic Tests: Dg Chest 1 View  05/28/2015  CLINICAL DATA:  Patient with respiratory failure. Shortness of breath. EXAM: CHEST 1 VIEW COMPARISON:  Chest radiograph 05/23/2015 FINDINGS: Tracheostomy tube terminates in the mid trachea. Enteric tube courses inferior to the diaphragm. Right upper extremity PICC line is present with tip projecting over the right atrium. Low lung volumes. Stable cardiac and  mediastinal contours. Small bilateral pleural effusions. Bibasilar airspace opacities. No pneumothorax. IMPRESSION: Right upper extremity PICC line is present with tip projecting over the right atrium. Low lung volumes, small bilateral pleural effusions and underlying opacities favored to represent atelectasis or infection. Associated interstitial pulmonary edema. Electronically Signed   By: Lovey Newcomer M.D.   On: 05/28/2015 13:06   Dg Chest 2 View  05/15/2015  CLINICAL DATA:  Shortness of breath. EXAM: CHEST  2 VIEW COMPARISON:  April 07, 2015. FINDINGS: The heart size and mediastinal contours are within normal limits. Hypoinflation of the lungs is noted. Mild bibasilar subsegmental atelectasis is noted. No pneumothorax or pleural effusion is noted. The visualized skeletal structures are unremarkable. IMPRESSION: Hypoinflation of the lungs with mild bibasilar subsegmental atelectasis. Electronically Signed   By: Marijo Conception, M.D.   On: 05/15/2015 21:06   Nm Pet Image Initial (pi) Skull Base To Thigh  05/14/2015  CLINICAL DATA:  Initial treatment strategy for lymphadenopathy and splenomegaly. Possibly lymphoproliferative disease. Myasthenia gravis. EXAM: NUCLEAR MEDICINE PET SKULL BASE TO THIGH TECHNIQUE: 14.8 mCi F-18 FDG was injected intravenously. Full-ring PET imaging was performed from the skull base to thigh after the radiotracer. CT data was obtained and used for attenuation correction and anatomic localization. FASTING BLOOD GLUCOSE:  Value: 97 mg/dl COMPARISON:  Chest CTA on 04/07/2015 FINDINGS: NECK No hypermetabolic lymph nodes in the neck. CHEST No hypermetabolic mediastinal or hilar nodes. No suspicious pulmonary nodules on the CT scan. ABDOMEN/PELVIS  No abnormal hypermetabolic activity within the liver, pancreas, adrenal glands, or spleen. No evidence of splenomegaly. No hypermetabolic lymph nodes in the abdomen or pelvis. SKELETON No focal hypermetabolic activity to suggest skeletal  metastasis. Mild diffusely increased hypermetabolic activity seen throughout the axial and appendicular bone marrow. IMPRESSION: No hypermetabolic masses or lymphadenopathy identified. Mild diffusely increased metabolic activity throughout the bone marrow, without focal metastatic lesions. This finding is of uncertain clinical significance, and is likely due to marrow stimulation. Bone marrow biopsy could be considered further evaluation if clinically warranted. Electronically Signed   By: Earle Gell M.D.   On: 05/14/2015 16:33   Dg Chest Port 1 View  05/23/2015  CLINICAL DATA:  Tracheostomy placement. EXAM: PORTABLE CHEST 1 VIEW COMPARISON:  Study obtained earlier in the day FINDINGS: There is now a tracheostomy present with the tracheostomy catheter tip 2.4 cm above the carina. Feeding tube tip is below the diaphragm. Central catheter tip is at the cavoatrial junction. No pneumothorax. There are bilateral pleural effusions, larger on the right than on the left. There is atelectatic change in the bases. Heart is mildly enlarged. The pulmonary vascularity appears normal. No adenopathy. No bone lesions. IMPRESSION: Tube and catheter positions as described without pneumothorax. Mild cardiomegaly with bilateral pleural effusions. Suspect a degree of congestive heart failure. There is bibasilar atelectasis. Electronically Signed   By: Lowella Grip III M.D.   On: 05/23/2015 16:17   Portable Chest Xray  05/23/2015  CLINICAL DATA:  55 year old female female with respiratory failure. Myasthenia gravis. Initial encounter. EXAM: PORTABLE CHEST 1 VIEW COMPARISON:  05/22/2015 and earlier. FINDINGS: Portable AP semi upright view at 0604 hours. Stable endotracheal tube tip at the level the clavicles. Enteric tube now in place, courses to the abdomen and tip not included. Multiple EKG leads and wires also overlie the chest. Stable right IJ central line. Continued low lung volumes with right greater than left basilar  veiling opacity. No pneumothorax. No acute pulmonary edema. Mediastinal contours remain within normal limits. IMPRESSION: 1. Enteric tube placed and courses to the abdomen, tip not included. Otherwise, stable lines and tubes. 2. Stable ventilation. Low lung volumes with bibasilar opacity probably reflecting atelectasis and small effusions. Electronically Signed   By: Genevie Ann M.D.   On: 05/23/2015 07:36   Portable Chest Xray  05/22/2015  CLINICAL DATA:  Re-intubation today EXAM: PORTABLE CHEST 1 VIEW COMPARISON:  05/22/2015 FINDINGS: Unchanged position of endotracheal tube and internal jugular central line. Cardiac enlargement stable. Bibasilar hypoventilatory change with low lung volumes. IMPRESSION: Bilateral lower lobe atelectasis. Findings similar to prior study, with mildly increased opacity at the bases. Electronically Signed   By: Skipper Cliche M.D.   On: 05/22/2015 12:14   Dg Chest Port 1 View  05/22/2015  CLINICAL DATA:  Acute respiratory failure EXAM: PORTABLE CHEST 1 VIEW COMPARISON:  05/21/15 FINDINGS: Cardiomediastinal silhouette is stable. Endotracheal tube in place with tip 2.5 cm above the carina. NG tube in place. Right IJ central line is unchanged in position. Central mild vascular congestion without convincing pulmonary edema persistent mild basilar atelectasis. No segmental infiltrate. IMPRESSION: Stable support apparatus. Central mild vascular congestion without convincing pulmonary edema. Persistent bilateral basilar atelectasis. Electronically Signed   By: Lahoma Crocker M.D.   On: 05/22/2015 07:50   Dg Chest Port 1 View  05/21/2015  CLINICAL DATA:  Acute respiratory failure EXAM: PORTABLE CHEST 1 VIEW COMPARISON:  05/19/2015 FINDINGS: Endotracheal tube terminates 5 cm above the carina. Mild bibasilar opacities, likely atelectasis.  No frank interstitial edema. No pleural effusion or pneumothorax. The heart is normal in size. Right IJ venous catheter terminates the cavoatrial junction.  Enteric tube courses into the mid stomach. IMPRESSION: Endotracheal tube terminates 5 cm above the carina. Mild bibasilar opacities, likely atelectasis. No frank interstitial edema. Electronically Signed   By: Charline Bills M.D.   On: 05/21/2015 10:45   Dg Chest Port 1 View  05/19/2015  CLINICAL DATA:  Hypoxia EXAM: PORTABLE CHEST 1 VIEW COMPARISON:  May 19, 2015 study obtained earlier in the day FINDINGS: Endotracheal tube tip is 9 mm above the carina. Central catheter tip is at the cavoatrial junction. Nasogastric tube tip and side port are below the diaphragm in the stomach. No pneumothorax. There is no edema or consolidation. Heart size and pulmonary vascularity are within normal limits. No adenopathy. No bone lesions apparent. IMPRESSION: Tube and catheter positions as described without pneumothorax. Note that the endotracheal tube tip is close to the carina. It may be prudent to withdrawal endotracheal tube approximately 3 cm. No edema or consolidation. These results will be called to the ordering clinician or representative by the Radiologist Assistant, and communication documented in the PACS or zVision Dashboard. Electronically Signed   By: Bretta Bang III M.D.   On: 05/19/2015 11:16   Dg Chest Port 1 View  05/19/2015  CLINICAL DATA:  Acute respiratory failure EXAM: PORTABLE CHEST 1 VIEW COMPARISON:  May 18, 2015 FINDINGS: The ETT is in good position. No other change in support apparatus. No pneumothorax. Bibasilar atelectasis, right greater than left. No other interval changes. IMPRESSION: The ETT is in good position.  No other change. Electronically Signed   By: Gerome Sam III M.D   On: 05/19/2015 08:11   Dg Chest Port 1 View  05/18/2015  CLINICAL DATA:  Acute respiratory failure.  Endotracheal tube. EXAM: PORTABLE CHEST 1 VIEW COMPARISON:  05/16/2015 FINDINGS: Endotracheal tube terminates approximately 2.5 cm above the carina. Right jugular central venous catheter terminates  over the cavoatrial junction. Enteric tube courses into the left upper abdomen with tip not imaged. Lung volumes remain diminished with persistent mild patchy opacities in both lung bases. Trace bilateral pleural effusions are questioned. No pneumothorax. IMPRESSION: 1. Support devices as above. 2. Low lung volumes with persistent mild bibasilar opacities which may reflect atelectasis. Electronically Signed   By: Sebastian Ache M.D.   On: 05/18/2015 08:21   Dg Chest Portable 1 View  05/16/2015  CLINICAL DATA:  Status post central line placement EXAM: PORTABLE CHEST 1 VIEW COMPARISON:  05/15/2015 FINDINGS: Cardiac shadow is stable. An endotracheal tube is now seen and lies just above the carina and should be withdrawn 1-2 cm. A nasogastric catheter is seen coiled within the stomach. A right jugular central line is noted at the cavoatrial junction. No pneumothorax is seen. Bilateral pleural effusions are noted with bibasilar patchy opacities. No bony abnormality is seen. IMPRESSION: Endotracheal tube at the level of the carina and should be withdrawn 1-2 cm. No pneumothorax following central line placement. Small bilateral pleural effusions and bibasilar patchy opacities. These results will be called to the ordering clinician or representative by the Radiologist Assistant, and communication documented in the PACS or zVision Dashboard. Electronically Signed   By: Alcide Clever M.D.   On: 05/16/2015 13:35   Dg Abd Portable 1v  05/26/2015  CLINICAL DATA:  Encounter for NG tube placement EXAM: PORTABLE ABDOMEN - 1 VIEW COMPARISON:  None. FINDINGS: The feeding tube extends into the stomach.  The tip is within the fourth portion duodenum not changed from prior. IMPRESSION: No change in position of feeding tube. Electronically Signed   By: Suzy Bouchard M.D.   On: 05/26/2015 15:31   Dg Abd Portable 1v  05/22/2015  CLINICAL DATA:  Feeding tube placement EXAM: PORTABLE ABDOMEN - 1 VIEW COMPARISON:  05/19/2015 FINDINGS:  NG tube is been exchange for a feeding tube. Feeding tube tip is in the distal fourth portion of the duodenum. Normal bowel gas pattern. No significant dilatation or obstruction. No acute osseous finding. IMPRESSION: Feeding tube tip distal duodenum. Electronically Signed   By: Jerilynn Mages.  Shick M.D.   On: 05/22/2015 15:29   Dg Abd Portable 1v  05/19/2015  CLINICAL DATA:  Evaluate OG tube. EXAM: PORTABLE ABDOMEN - 1 VIEW COMPARISON:  May 16, 2015 FINDINGS: The OG tube terminates in the distal stomach. No other acute abnormalities identified. IMPRESSION: Appropriate placement of OG tube. Electronically Signed   By: Dorise Bullion III M.D   On: 05/19/2015 11:17   Dg Abd Portable 1v  05/16/2015  CLINICAL DATA:  OG tube placement EXAM: PORTABLE ABDOMEN - 1 VIEW COMPARISON:  None. FINDINGS: Enteric tube is in place with the tip in the very pyloric region and the side port in the distal stomach. Nonobstructive bowel gas pattern. IMPRESSION: Enteric tube tip in the peripyloric region. Electronically Signed   By: Rolm Baptise M.D.   On: 05/16/2015 13:34    Hillary Corinda Gubler, MD 06/01/2015, 7:21 AM PGY-1, [place] Intern pager: [phone], text pages welcome

## 2015-06-01 NOTE — Clinical Social Work Note (Signed)
Per charge RN, the patient is now agreeable to SNF placement. The patient is requesting [facility]. CSW will send referral to [facility].   Liz Beach MSW, [place], [place], [phone]

## 2015-06-01 NOTE — Clinical Social Work Placement (Signed)
   CLINICAL SOCIAL WORK PLACEMENT  NOTE  Date:  06/01/2015  Patient Details  Name: CHARNESE OFFEN MRN: [id number] Date of Birth: [birth year]  Clinical Social Work is seeking post-discharge placement for this patient at the [place] level of care (*CSW will initial, date and re-position this form in  chart as items are completed):  Yes   Patient/family provided with [place] Work Department's list of facilities offering this level of care within the geographic area requested by the patient (or if unable, by the patient's family).  Yes   Patient/family informed of their freedom to choose among providers that offer the needed level of care, that participate in Medicare, Medicaid or managed care program needed by the patient, have an available bed and are willing to accept the patient.  Yes   Patient/family informed of [facility]'s ownership interest in [facility] and [facility], as well as of the fact that they are under no obligation to receive care at these facilities.  PASRR submitted to EDS on 06/01/15     PASRR number received on       Existing PASRR number confirmed on       FL2 transmitted to all facilities in geographic area requested by pt/family on 06/01/15     FL2 transmitted to all facilities within larger geographic area on       Patient informed that his/her managed care company has contracts with or will negotiate with certain facilities, including the following:        Yes   Patient/family informed of bed offers received.  Patient chooses bed at [place] recommends and patient chooses bed at      Patient to be transferred to [facility] on  .  Patient to be transferred to facility by       Patient family notified on   of transfer.  Name of family member notified:        PHYSICIAN Please prepare priority discharge summary, including medications, Please prepare  prescriptions, Please sign FL2     Additional Comment:    _______________________________________________ Rigoberto Noel, LCSW 06/01/2015, 2:40 PM

## 2015-06-01 NOTE — Discharge Summary (Signed)
[facility] Discharge Summary  Patient name: Frances Medina Medical record number: [id number] Date of birth: 1960-12-07 Age: 55 y.o. Gender: female Date of Admission: 05/15/2015  Date of Discharge: 06/08/2015 Admitting Physician: [name], MD  Primary Care Provider: Andrena Mews, MD Consultants: Neurology, CCM, Pulmonology  Indication for Hospitalization: Increased weakness, dyspnea in setting of MG  Discharge Diagnoses/Problem List:  Principal Problem:   Myasthenia gravis with acute exacerbation ([place]) Active Problems:   Acute respiratory failure with hypoxemia      GERD (gastroesophageal reflux disease)   Dysphagia   Anemia   Depression   Leukocytosis   Presence of tracheostomy    Possible VAP (ventilator-associated pneumonia)   Disposition: Home with [facility]   Discharge Condition: Improved   Discharge Exam:  General: Obese female, sitting in recliner, in NAD. Pleasant Cardiovascular: RRR, S1, S2, no m/r/g Chest: CTAB without wheezing, rhonchi, or crackles, trach in place and clean Abdomen: +BS, soft, NT, ND Extremities: Moves all spontaneously, strength grossly intact   Brief Hospital Course:  Frances Medina is a 55 y.o. female who presented with myasthenia gravis exacerbation. PMH is significant for HTN, uterine fibroids, depression, anxiety, migraines and recently diagnosed myasthenia gravis, and undergoing work-up of splenomegaly with lymphadenopathy.   2/10 >> D/C from hospital  3/13 >> Onset of severe SOB and weakness  3/14 >> Presented to ED  3/15 >> Transferred to Neuro ICU, IVIG and ETT  3/18 >> Unsuccessful extubation, reintubated.  3/19 >> Completed 5 days IVIG 3/21 >> Unsuccessful extubation, reintubated 3/22 >> Trach placed, solu-medrol 3/22  3/26 >> PICC line placed (right basilic) 8/82 >> Neurology signed off 3/28 >> Began IV vancomycin and imipenem 3/30 >> Vancomycin discontinued as trach cx with just oral  flora 3/31 >> Lost NG tube; replaced; Trach sutures removed by CCM  4/4 >> Frances Medina  4/7 >> Trach successfully withdrawn   Myasthenia Gravis Exacerbation:  Unclear trigger of symptoms, though discontinuation of steroids after last hospitalization is most likely cause. Patient's initial NIF score was -25 and VC 500 ml, so she was transferred to the neuro ICU and intubated for concern for impending respiratory failure. She was intubated from 3/15 through 3/22 at which time tracheostomy was performed s/p 2 unsuccessful extubation attempts on 3/18 and 3/21. Upper airway edema was thought to be cause of continued airway instability rather than continued weakness from MG, as strength had improved in upper and lower extremities after treatment with 5 days of IVIG and increase in mestinon from 60 mg TID to 60 mg qid. Stridor continued despite IV solumedrol.  Neurology did not recommend further steroids.  Tracheostomy: Placed on 3/22. Pulmonology followed throughout hospital stay and removed sutures on 3/31. Frances Medina was not quickly downsized, as they were waiting for further airway maturation. SLP recommended uncuffed tube to allow use of PMSV. Frances Medina was downsized on 4/4 and successfully decannulated on 4/7.   Presumed VAP:  Patient had significant respiratory secretions after trach placement. CXR 3/27 was worse compared to 3/22 with decreased lung volume, possibly worsened bilateral pleural effusions and opacities concerning for infection vs. atelectasis. White count was elevated to 19.4 on 3/28, and patient had tachycardia and softer blood pressures. Given concern for VAP, she was started on vanc (3/28 >> 3/30) and imipenem (3/28 >> 4/4). Tracheal aspirate was obtained and grew normal oropharyngeal flora. Vital signs also had normalized with improving leukocytosis, so vancomycin was stopped. Blood cultures also were negative.   Feeds: Started  on NG feeds with intubation. SLP to repeat swallow study to  determine if PO feeds may be safely re-initiated or if g-tube placement may be needed. Tracheostomy tube could not initially be downsized due to increased secretions. Based on swallow study results, speech recommended a Dysphagia II Diet.   Diarrhea: Improved, possibly secondary to increase in pyridostigmine. C. diff panel resulted as antibody positive, toxin negative, so she was not given flagyl.   HFpEF: ECHO 04/09/15 performed due to SOB showed EF of 60-65% and G1DD. Patient euvolemic.  Splenomegaly w/Lymphadenopathy: Being followed by Oncologist Dr. Irene Limbo. No hypermetabolic masses or lymphadenopathy seen on PET scan 05/14/15. Hgb monitored throughout hospitalization and never crossed transfusion threshold of 7.   Hx of HTN: No antihypertensive medication needed during hospitalization.   Depression and Anxiety: Treatment with home zoloft 50 mg daily and klonopin 0.5 mg BID were continued.  E. Coli UTI: s/p 3 days bactrim from 3/18 to 3/20.   Issues for Follow Up:  1. Will need close follow-up with her primary Neurologist Dr. Audelia Acton of [facility]. 2. Mestinon increased from 60 mg TID to 60 mg QID. Neurology did not recommend steroids at discharge.  3. Stoma Care Recommendations at Discharge Per CCM: Keep current dressing x 48hrs May shower as long as stoma covered After 48 hrs place band aid daily until stoma close No submerging under water until stoma closed.   Significant Procedures: Inbubation, tracheostomy, PICC line placement  Significant Labs and Imaging:   Recent Labs Lab 06/05/15 0547 06/06/15 0607 06/07/15 0614  WBC 9.2 9.4 8.5  HGB 9.8* 10.1* 10.8*  HCT 32.5* 32.7* 34.4*  PLT 320 334 344    Recent Labs Lab 06/03/15 0601 06/04/15 0500 06/05/15 0547 06/06/15 0607 06/07/15 0614  NA 142 139 142 141 140  K 3.7 3.9 3.7 3.8 4.1  CL 106 103 104 102 102  CO2 _0 GLUCOSE 116* 99 108* 87 100*  BUN _1 CREATININE 0.41* 0.42* 0.40* 0.45 0.56   CALCIUM 8.8* 8.9 9.0 9.2 9.3  MG 2.2 2.1 2.1 2.1 2.2  PHOS 2.8 3.4 3.5 3.3 4.1  ALBUMIN 2.6* 2.7* 2.7* 2.9* 2.9*    Dg Chest 1 View  05/28/2015  CLINICAL DATA:  Patient with respiratory failure. Shortness of breath. EXAM: CHEST 1 VIEW COMPARISON:  Chest radiograph 05/23/2015 FINDINGS: Tracheostomy tube terminates in the mid trachea. Enteric tube courses inferior to the diaphragm. Right upper extremity PICC line is present with tip projecting over the right atrium. Low lung volumes. Stable cardiac and mediastinal contours. Small bilateral pleural effusions. Bibasilar airspace opacities. No pneumothorax. IMPRESSION: Right upper extremity PICC line is present with tip projecting over the right atrium. Low lung volumes, small bilateral pleural effusions and underlying opacities favored to represent atelectasis or infection. Associated interstitial pulmonary edema. Electronically Signed   By: Lovey Newcomer M.D.   On: 05/28/2015 13:06   Dg Chest 2 View  05/15/2015  CLINICAL DATA:  Shortness of breath. EXAM: CHEST  2 VIEW COMPARISON:  April 07, 2015. FINDINGS: The heart size and mediastinal contours are within normal limits. Hypoinflation of the lungs is noted. Mild bibasilar subsegmental atelectasis is noted. No pneumothorax or pleural effusion is noted. The visualized skeletal structures are unremarkable. IMPRESSION: Hypoinflation of the lungs with mild bibasilar subsegmental atelectasis. Electronically Signed   By: Marijo Conception, M.D.   On: 05/15/2015 21:06   Nm Pet Image Initial (pi) Skull Base To Thigh  05/14/2015  CLINICAL DATA:  Initial treatment strategy for lymphadenopathy and splenomegaly. Possibly lymphoproliferative disease. Myasthenia gravis. EXAM: NUCLEAR MEDICINE PET SKULL BASE TO THIGH TECHNIQUE: 14.8 mCi F-18 FDG was injected intravenously. Full-ring PET imaging was performed from the skull base to thigh after the radiotracer. CT data was obtained and used for attenuation correction and  anatomic localization. FASTING BLOOD GLUCOSE:  Value: 97 mg/dl COMPARISON:  Chest CTA on 04/07/2015 FINDINGS: NECK No hypermetabolic lymph nodes in the neck. CHEST No hypermetabolic mediastinal or hilar nodes. No suspicious pulmonary nodules on the CT scan. ABDOMEN/PELVIS No abnormal hypermetabolic activity within the liver, pancreas, adrenal glands, or spleen. No evidence of splenomegaly. No hypermetabolic lymph nodes in the abdomen or pelvis. SKELETON No focal hypermetabolic activity to suggest skeletal metastasis. Mild diffusely increased hypermetabolic activity seen throughout the axial and appendicular bone marrow. IMPRESSION: No hypermetabolic masses or lymphadenopathy identified. Mild diffusely increased metabolic activity throughout the bone marrow, without focal metastatic lesions. This finding is of uncertain clinical significance, and is likely due to marrow stimulation. Bone marrow biopsy could be considered further evaluation if clinically warranted. Electronically Signed   By: Earle Gell M.D.   On: 05/14/2015 16:33   Dg Chest Port 1 View  05/23/2015  CLINICAL DATA:  Tracheostomy placement. EXAM: PORTABLE CHEST 1 VIEW COMPARISON:  Study obtained earlier in the day FINDINGS: There is now a tracheostomy present with the tracheostomy catheter tip 2.4 cm above the carina. Feeding tube tip is below the diaphragm. Central catheter tip is at the cavoatrial junction. No pneumothorax. There are bilateral pleural effusions, larger on the right than on the left. There is atelectatic change in the bases. Heart is mildly enlarged. The pulmonary vascularity appears normal. No adenopathy. No bone lesions. IMPRESSION: Tube and catheter positions as described without pneumothorax. Mild cardiomegaly with bilateral pleural effusions. Suspect a degree of congestive heart failure. There is bibasilar atelectasis. Electronically Signed   By: Lowella Grip III M.D.   On: 05/23/2015 16:17   Portable Chest  Xray  05/23/2015  CLINICAL DATA:  55 year old female female with respiratory failure. Myasthenia gravis. Initial encounter. EXAM: PORTABLE CHEST 1 VIEW COMPARISON:  05/22/2015 and earlier. FINDINGS: Portable AP semi upright view at 0604 hours. Stable endotracheal tube tip at the level the clavicles. Enteric tube now in place, courses to the abdomen and tip not included. Multiple EKG leads and wires also overlie the chest. Stable right IJ central line. Continued low lung volumes with right greater than left basilar veiling opacity. No pneumothorax. No acute pulmonary edema. Mediastinal contours remain within normal limits. IMPRESSION: 1. Enteric tube placed and courses to the abdomen, tip not included. Otherwise, stable lines and tubes. 2. Stable ventilation. Low lung volumes with bibasilar opacity probably reflecting atelectasis and small effusions. Electronically Signed   By: Genevie Ann M.D.   On: 05/23/2015 07:36   Portable Chest Xray  05/22/2015  CLINICAL DATA:  Re-intubation today EXAM: PORTABLE CHEST 1 VIEW COMPARISON:  05/22/2015 FINDINGS: Unchanged position of endotracheal tube and internal jugular central line. Cardiac enlargement stable. Bibasilar hypoventilatory change with low lung volumes. IMPRESSION: Bilateral lower lobe atelectasis. Findings similar to prior study, with mildly increased opacity at the bases. Electronically Signed   By: Skipper Cliche M.D.   On: 05/22/2015 12:14   Dg Chest Port 1 View  05/22/2015  CLINICAL DATA:  Acute respiratory failure EXAM: PORTABLE CHEST 1 VIEW COMPARISON:  05/21/15 FINDINGS: Cardiomediastinal silhouette is stable. Endotracheal tube in place with tip 2.5 cm above  the carina. NG tube in place. Right IJ central line is unchanged in position. Central mild vascular congestion without convincing pulmonary edema persistent mild basilar atelectasis. No segmental infiltrate. IMPRESSION: Stable support apparatus. Central mild vascular congestion without convincing  pulmonary edema. Persistent bilateral basilar atelectasis. Electronically Signed   By: Lahoma Crocker M.D.   On: 05/22/2015 07:50   Dg Chest Port 1 View  05/21/2015  CLINICAL DATA:  Acute respiratory failure EXAM: PORTABLE CHEST 1 VIEW COMPARISON:  05/19/2015 FINDINGS: Endotracheal tube terminates 5 cm above the carina. Mild bibasilar opacities, likely atelectasis. No frank interstitial edema. No pleural effusion or pneumothorax. The heart is normal in size. Right IJ venous catheter terminates the cavoatrial junction. Enteric tube courses into the mid stomach. IMPRESSION: Endotracheal tube terminates 5 cm above the carina. Mild bibasilar opacities, likely atelectasis. No frank interstitial edema. Electronically Signed   By: Julian Hy M.D.   On: 05/21/2015 10:45   Dg Chest Port 1 View  05/19/2015  CLINICAL DATA:  Hypoxia EXAM: PORTABLE CHEST 1 VIEW COMPARISON:  May 19, 2015 study obtained earlier in the day FINDINGS: Endotracheal tube tip is 9 mm above the carina. Central catheter tip is at the cavoatrial junction. Nasogastric tube tip and side port are below the diaphragm in the stomach. No pneumothorax. There is no edema or consolidation. Heart size and pulmonary vascularity are within normal limits. No adenopathy. No bone lesions apparent. IMPRESSION: Tube and catheter positions as described without pneumothorax. Note that the endotracheal tube tip is close to the carina. It may be prudent to withdrawal endotracheal tube approximately 3 cm. No edema or consolidation. These results will be called to the ordering clinician or representative by the Radiologist Assistant, and communication documented in the PACS or zVision Dashboard. Electronically Signed   By: Lowella Grip III M.D.   On: 05/19/2015 11:16   Dg Chest Port 1 View  05/19/2015  CLINICAL DATA:  Acute respiratory failure EXAM: PORTABLE CHEST 1 VIEW COMPARISON:  May 18, 2015 FINDINGS: The ETT is in good position. No other change in  support apparatus. No pneumothorax. Bibasilar atelectasis, right greater than left. No other interval changes. IMPRESSION: The ETT is in good position.  No other change. Electronically Signed   By: Dorise Bullion III M.D   On: 05/19/2015 08:11   Dg Chest Port 1 View  05/18/2015  CLINICAL DATA:  Acute respiratory failure.  Endotracheal tube. EXAM: PORTABLE CHEST 1 VIEW COMPARISON:  05/16/2015 FINDINGS: Endotracheal tube terminates approximately 2.5 cm above the carina. Right jugular central venous catheter terminates over the cavoatrial junction. Enteric tube courses into the left upper abdomen with tip not imaged. Lung volumes remain diminished with persistent mild patchy opacities in both lung bases. Trace bilateral pleural effusions are questioned. No pneumothorax. IMPRESSION: 1. Support devices as above. 2. Low lung volumes with persistent mild bibasilar opacities which may reflect atelectasis. Electronically Signed   By: Logan Bores M.D.   On: 05/18/2015 08:21   Dg Chest Portable 1 View  05/16/2015  CLINICAL DATA:  Status post central line placement EXAM: PORTABLE CHEST 1 VIEW COMPARISON:  05/15/2015 FINDINGS: Cardiac shadow is stable. An endotracheal tube is now seen and lies just above the carina and should be withdrawn 1-2 cm. A nasogastric catheter is seen coiled within the stomach. A right jugular central line is noted at the cavoatrial junction. No pneumothorax is seen. Bilateral pleural effusions are noted with bibasilar patchy opacities. No bony abnormality is seen. IMPRESSION: Endotracheal tube at  the level of the carina and should be withdrawn 1-2 cm. No pneumothorax following central line placement. Small bilateral pleural effusions and bibasilar patchy opacities. These results will be called to the ordering clinician or representative by the Radiologist Assistant, and communication documented in the PACS or zVision Dashboard. Electronically Signed   By: Inez Catalina M.D.   On: 05/16/2015  13:35   Dg Abd Portable 1v  06/01/2015  CLINICAL DATA:  Enteric tube placement EXAM: PORTABLE ABDOMEN - 1 VIEW COMPARISON:  05/26/2015 abdominal radiograph FINDINGS: Weighted enteric tube traverses the stomach and loops in the distal duodenum, with the tip of likely in the proximal jejunum versus fourth portion of the duodenum. No dilated small bowel loops. No evidence of pneumatosis or pneumoperitoneum. IMPRESSION: Weighted enteric tube terminates in a post pyloric position, either in the proximal jejunum or fourth portion of the duodenum. Electronically Signed   By: Ilona Sorrel M.D.   On: 06/01/2015 14:56   Dg Abd Portable 1v  05/26/2015  CLINICAL DATA:  Encounter for NG tube placement EXAM: PORTABLE ABDOMEN - 1 VIEW COMPARISON:  None. FINDINGS: The feeding tube extends into the stomach. The tip is within the fourth portion duodenum not changed from prior. IMPRESSION: No change in position of feeding tube. Electronically Signed   By: Suzy Bouchard M.D.   On: 05/26/2015 15:31   Dg Abd Portable 1v  05/22/2015  CLINICAL DATA:  Feeding tube placement EXAM: PORTABLE ABDOMEN - 1 VIEW COMPARISON:  05/19/2015 FINDINGS: NG tube is been exchange for a feeding tube. Feeding tube tip is in the distal fourth portion of the duodenum. Normal bowel gas pattern. No significant dilatation or obstruction. No acute osseous finding. IMPRESSION: Feeding tube tip distal duodenum. Electronically Signed   By: Jerilynn Mages.  Shick M.D.   On: 05/22/2015 15:29   Dg Abd Portable 1v  05/19/2015  CLINICAL DATA:  Evaluate OG tube. EXAM: PORTABLE ABDOMEN - 1 VIEW COMPARISON:  May 16, 2015 FINDINGS: The OG tube terminates in the distal stomach. No other acute abnormalities identified. IMPRESSION: Appropriate placement of OG tube. Electronically Signed   By: Dorise Bullion III M.D   On: 05/19/2015 11:17   Dg Abd Portable 1v  05/16/2015  CLINICAL DATA:  OG tube placement EXAM: PORTABLE ABDOMEN - 1 VIEW COMPARISON:  None. FINDINGS: Enteric  tube is in place with the tip in the very pyloric region and the side port in the distal stomach. Nonobstructive bowel gas pattern. IMPRESSION: Enteric tube tip in the peripyloric region. Electronically Signed   By: Rolm Baptise M.D.   On: 05/16/2015 13:34    Results/Tests Pending at Time of Discharge: None   Discharge Medications:    Medication List    STOP taking these medications        predniSONE 20 MG tablet  Commonly known as:  DELTASONE      TAKE these medications        albuterol 108 (90 Base) MCG/ACT inhaler  Commonly known as:  PROVENTIL HFA;VENTOLIN HFA  Inhale 2 puffs into the lungs every 4 (four) hours as needed for wheezing or shortness of breath.     clonazePAM 1 MG tablet  Commonly known as:  KLONOPIN  Take 0.5-1 mg by mouth 2 (two) times daily as needed for anxiety.     estradiol 0.5 MG tablet  Commonly known as:  ESTRACE  Take 0.5 mg by mouth daily.     famotidine 20 MG tablet  Commonly known as:  PEPCID  Take 1 tablet (20 mg total) by mouth 2 (two) times daily.     ferrous sulfate 325 (65 FE) MG tablet  Take 1 tablet (325 mg total) by mouth 2 (two) times daily with a meal.     LORazepam 2 MG tablet  Commonly known as:  ATIVAN  Take 1 tablet (2 mg total) by mouth once as needed for anxiety or sedation (use one tablet 15 -79mn before PET scan/CT. Do not use with Klonopin and ensure someone drives you to and from your procedure).     NOREL AD 4-10-325 MG Tabs  Generic drug:  Chlorphen-PE-Acetaminophen  Take 1 tablet by mouth 2 (two) times daily as needed.     pyridostigmine 60 MG tablet  Commonly known as:  MESTINON  Take 1 tablet (60 mg total) by mouth 4 (four) times daily.     sertraline 100 MG tablet  Commonly known as:  ZOLOFT  Take 50 mg by mouth every morning.     Vitamin D (Ergocalciferol) 50000 units Caps capsule  Commonly known as:  DRISDOL  Take 1 capsule (50,000 Units total) by mouth every 7 (seven) days.        Discharge  Instructions: Please refer to Patient Instructions section of EMR for full details.  Patient was counseled important signs and symptoms that should prompt return to medical care, changes in medications, dietary instructions, activity restrictions, and follow up appointments.   Follow-Up Appointments: Follow-up Information    [facility]   [place]:  [place]  Why:  Registered Nurse, Physical Therapy- High Risk Initiative   Contact information:   [place] [phone]      [facility] on 06/18/2015.   Specialty:  Family Medicine   Why:  For Hospital Followup at 9:00 with Dr. GLucky Rathkeinformation:   [address][phone]     Schedule an appointment as soon as possible for a visit with OMelburn Popper MD.   Specialty:  Cardiology   [facility]  [facility]   Contact information:   [phone]       CNicolette Bang DO 06/08/2015, 12:02 PM PGY-1, [place]

## 2015-06-01 NOTE — Care Management Note (Addendum)
Case Management Note  Patient Details  Name: Frances Medina MRN: [id number] Date of Birth: 11/26/1960  Subjective/Objective:     Pt admitted as a transfer from [place].  55 y.o. female presenting with myasthenia gravis exacerbation. PMH is significant for HTN,uterine fibroids, depression, anxiety, migraines and recently diagnosed myasthenia gravis. Pt is from home.                   Action/Plan: CM did consult CSW for assistance with disposition to SNF. Daughter works at [employer] and wants pt close at [facility] for [employer]. CSW is aware. CM did place call to physician advisor for LTAC approval. Pt has [employer]. CM will continue to monitor for additional needs. CM did speak physician in regards to plan of care.   Expected Discharge Date:                  Expected Discharge Plan:  Long Term Acute Care (LTAC)  In-House Referral:  Clinical Social Work  Discharge planning Services  CM Consult  Post Acute Care Choice:   Home with [place].  Choice offered to:   Patient  DME Arranged:   N/A DME Agency:   N/A  HH Arranged:   RN, PT [place] Agency:    [place]  Status of Service: Completed.  Medicare Important Message Given:    Date Medicare IM Given:    Medicare IM give by:    Date Additional Medicare IM Given:    Additional Medicare Important Message give by:     If discussed at [place] of Stay Meetings, dates discussed:    Additional Comments: [place] 06-07-15 Jacqlyn Krauss, RN,BSN [phone] CM did discuss pt in the [place] today and Pt will be [place] with [facility]. CM did make pt aware of Agency Change. SOC to begin within 24-48 hrs post d/c. No further needs from CM at this time.   1040 06-06-15 Jacqlyn Krauss, RN,BSN [phone] CM did speak with pt in regards to [place]. Plan will be for home and pt is agreeable to [facility]. Choice provided. Pt has used [facility] in the past. Pt will need HHRN, PT Services. Orders need to be written  once stable. No further needs from CM at this time.   1132 06-05-15 Jacqlyn Krauss, RN,BSN [phone] Pt with down-sizing trach. CM will continue to monitor for disposition needs.    Bethena Roys, RN 06/01/2015, 2:32 PM

## 2015-06-01 NOTE — Progress Notes (Signed)
Nutrition Follow-up  DOCUMENTATION CODES:   Morbid obesity  INTERVENTION:    Continue TF after Cortrak tube placed and placement verified with Vital AF 1.2 at goal rate of 65 ml/h to provide 1872 kcals, 117 gm protein, 1265 ml free water daily.  NUTRITION DIAGNOSIS:   Inadequate oral intake related to inability to eat as evidenced by NPO status.  Ongoing  GOAL:   Patient will meet greater than or equal to 90% of their needs  Met  MONITOR:   Labs, TF tolerance, Diet advancement, I & O's  ASSESSMENT:   Pt presenting with myasthenia gravis exacerbation. PMH is significant for HTN,uterine fibroids, depression, anxiety, migraines and recently diagnosed myasthenia gravis, and undergoing work-up of splenomegaly with lymphadenopathy.  S/P tracheostomy on 3/22; currently on trach collar and is tolerating well. SLP is working with patient for PMSV use. Remains NPO.   Current TF order: Vital AF 1.2 via NGT at 65 ml/h (1560 ml/day) to provide 1872 kcals, 117 gm protein, 1265 ml free water daily. NGT was accidentally pulled earlier this morning. Plans for Cortrak tube placement today.  Diet Order:  Diet NPO time specified  Skin:  Reviewed, no issues  Last BM:  3/31  Height:   Ht Readings from Last 1 Encounters:  05/16/15 5' (1.524 m)    Weight:   Wt Readings from Last 1 Encounters:  06/01/15 258 lb 6.4 oz (117.209 kg)    Ideal Body Weight:  45.4 kg  BMI:  Body mass index is 50.47 kg/(m^2).  Estimated Nutritional Needs:   Kcal:  1700-2000  Protein:  115 gm  Fluid:  1.7-2 L  EDUCATION NEEDS:   No education needs identified at this time   Molli Barrows, [place], [place], [place] Pager [phone] After Hours Pager [phone]

## 2015-06-01 NOTE — Progress Notes (Signed)
NG tube came out while pt was getting transferred from the bedside commode to the recliner.MD paged. Md called back and aggred to keep the pt without nasogastric tube for now and reevaluate the need for reinsertion of a new NG tube later after speech therapist assessment is completed.  Ferdinand Lango, RN

## 2015-06-01 NOTE — Plan of Care (Signed)
Problem: Skin Integrity: Goal: Risk for impaired skin integrity will decrease Outcome: Progressing Pt now able to ambulate and turn self in bed/chair

## 2015-06-01 NOTE — NC FL2 (Signed)
[place] MEDICAID FL2 LEVEL OF CARE SCREENING TOOL     IDENTIFICATION  Patient Name: Frances Medina Birthdate: Jun 15, 1960 Sex: female Admission Date (Current Location): 05/15/2015  [facility] and [place] Number:  Herbalist and Address:  [facility], [place] [address] [place], [place] [address]      Provider Number: [id number]  Attending Physician Name and Address:  [name], MD  Relative Name and Phone Number:       Current Level of Care: Hospital Recommended Level of Care: [place] Prior Approval Number:    Date Approved/Denied:   PASRR Number:    Discharge Plan: SNF    Current Diagnoses: Patient Active Problem List   Diagnosis Date Noted  . Endotracheally intubated   . VAP (ventilator-associated pneumonia) ([place])   . Acute respiratory failure with hypoxemia ([place])   . Depression   . Headache, migraine   . Tachypnea   . Enteritis due to Clostridium difficile   . Leukocytosis   . Presence of tracheostomy ([place])   . Acute respiratory failure ([place])   . MG (myasthenia gravis) ([place]) 05/16/2015  . Myasthenia exacerbation ([place])   . Difficult intravenous access   . HTN (hypertension), benign   . Myasthenia gravis with acute exacerbation ([place]) 05/15/2015  . Shortness of breath   . Diplopia   . Dysphagia, neurologic   . Elevated LDH 05/06/2015  . Elevated BP 05/01/2015  . Fatigue 05/01/2015  . Chest wall pain 04/08/2015  . Dyspnea 04/07/2015  . Hypokalemia 04/07/2015  . Anemia 04/07/2015  . Neutropenia ([place]) 04/07/2015  . Lymphadenopathy 04/07/2015  . Splenomegaly 04/07/2015  . Myasthenia gravis ([place])   . Dysphagia   . Inability to swallow 03/20/2015  . Osteoarthritis of left knee 12/02/2013  . Primary localized osteoarthrosis of the knee 12/02/2013  . Benign neoplasm of colon 09/19/2013  . Osteoarthritis of right knee 08/30/2013  . GERD (gastroesophageal reflux disease) 06/23/2013  . Obesity 06/23/2013   . Anxiety and depression 06/23/2013  . Fibroids 06/23/2013    Orientation RESPIRATION BLADDER Height & Weight     Self, Time, Situation, Place  Tracheostomy, O2 (Currently has Shiley 56mm cuffed trach. 5L FiO2 28%) Continent Weight: 117.209 kg (258 lb 6.4 oz) Height:  5' (152.4 cm)  BEHAVIORAL SYMPTOMS/MOOD NEUROLOGICAL BOWEL NUTRITION STATUS   (NONE)  (NONE) Continent  (Currently NPO, to be determined.)  AMBULATORY STATUS COMMUNICATION OF NEEDS Skin   Limited Assist Verbally (Patient communicates verbally but does have difficulty speaking due to trach.) Normal                       Personal Care Assistance Level of Assistance  Bathing, Feeding, Dressing Bathing Assistance: Limited assistance Feeding assistance: Independent Dressing Assistance: Limited assistance     Functional Limitations Info  Sight, Hearing, Speech Sight Info: Adequate Hearing Info: Adequate Speech Info: Impaired    SPECIAL CARE FACTORS FREQUENCY  PT (By licensed PT), OT (By licensed OT) (Patient will require trach care, patient may need Peg tube care if she does not pass swallow evaluation.)     PT Frequency: 5/week OT Frequency: 5/week            Contractures Contractures Info: Not present    Additional Factors Info  Code Status, Allergies, Psychotropic, Isolation Precautions Code Status Info: Full Allergies Info: Dicyclomine, Methocarbamol, Penicillins, Ciprofloxacin, Shrimp Psychotropic Info: Klonopin, Zoloft   Isolation Precautions Info: Enteric precautions for C. Diff  Current Medications (06/01/2015):  This is the current hospital active medication list Current Facility-Administered Medications  Medication Dose Route Frequency Provider Last Rate Last Dose  . acetaminophen (TYLENOL) solution 650 mg  650 mg Per Tube Q6H PRN Tanda Rockers, MD   650 mg at 05/30/15 1343  . albuterol (PROVENTIL HFA;VENTOLIN HFA) 108 (90 Base) MCG/ACT inhaler 2 puff  2 puff Inhalation Q4H PRN Rogue Bussing, MD      . antiseptic oral rinse (CPC / CETYLPYRIDINIUM CHLORIDE 0.05%) solution 7 mL  7 mL Mouth Rinse q12n4p [name], MD   7 mL at 05/31/15 1243  . chlorhexidine (PERIDEX) 0.12 % solution 15 mL  15 mL Mouth Rinse BID [name], MD   15 mL at 06/01/15 1130  . clonazePAM (KLONOPIN) tablet 0.5 mg  0.5 mg Oral BID Rush Farmer, MD   0.5 mg at 05/31/15 2133  . docusate (COLACE) 50 MG/5ML liquid 100 mg  100 mg Per Tube BID PRN Erick Colace, NP      . famotidine (PEPCID) 40 MG/5ML suspension 20 mg  20 mg Per Tube BID Javier Glazier, MD   20 mg at 05/31/15 2132  . feeding supplement (VITAL AF 1.2 CAL) liquid 1,000 mL  1,000 mL Per Tube Continuous Rush Farmer, MD 65 mL/hr at 05/31/15 1940 1,000 mL at 05/31/15 1940  . fentaNYL (SUBLIMAZE) injection 100 mcg  100 mcg Intravenous Q2H PRN Kara Mead V, MD   100 mcg at 06/01/15 [id number]  . heparin injection 5,000 Units  5,000 Units Subcutaneous 3 times per day Javier Glazier, MD   5,000 Units at 06/01/15 1351  . imipenem-cilastatin (PRIMAXIN) 500 mg in sodium chloride 0.9 % 100 mL IVPB  500 mg Intravenous 4 times per day Lyndee Leo, RPH   500 mg at 06/01/15 1313  . iohexol (OMNIPAQUE) 350 MG/ML injection 100 mL  100 mL Intravenous Once PRN Marliss Coots, PA-C      . loperamide (IMODIUM) 1 MG/5ML solution 2 mg  2 mg Per Tube PRN [name], MD      . multivitamin with minerals tablet 1 tablet  1 tablet Oral Daily Asencion Islam, RD   1 tablet at 05/31/15 1000  . pyridostigmine (MESTINON) tablet 60 mg  60 mg Per Tube QID Ram Fuller Mandril, MD   60 mg at 05/31/15 1839  . sertraline (ZOLOFT) 20 MG/ML concentrated solution 50 mg  50 mg Per Tube Daily Javier Glazier, MD   50 mg at 05/31/15 1001  . sodium chloride flush (NS) 0.9 % injection 10-40 mL  10-40 mL Intracatheter Q12H Rush Farmer, MD   10 mL at 06/01/15 1000  . sodium chloride flush (NS) 0.9 % injection 10-40 mL  10-40 mL Intracatheter PRN  Rush Farmer, MD      . sodium chloride flush (NS) 0.9 % injection 10-40 mL  10-40 mL Intracatheter PRN [name], MD      . sodium chloride flush (NS) 0.9 % injection 3 mL  3 mL Intravenous Q12H Rogue Bussing, MD   3 mL at 06/01/15 1000     Discharge Medications: Please see discharge summary for a list of discharge medications.  Relevant Imaging Results:  Relevant Lab Results:   Additional Information SSN: [id number]  Rigoberto Noel, LCSW

## 2015-06-01 NOTE — Clinical Social Work Note (Signed)
Clinical Social Work Assessment  Patient Details  Name: Frances Medina MRN: [id number] Date of Birth: 10/02/1960  Date of referral:  06/01/15               Reason for consult:  Discharge Planning, Facility Placement                Permission sought to share information with:    Permission granted to share information::  No  Name::        Agency::     Relationship::     Contact Information:     Housing/Transportation Living arrangements for the past 2 months:  Single Family Home Source of Information:  Patient Patient Interpreter Needed:  None Criminal Activity/Legal Involvement Pertinent to Current Situation/Hospitalization:  No - Comment as needed Significant Relationships:  Spouse, Adult Children Lives with:  Spouse Do you feel safe going back to the place where you live?  Yes Need for family participation in patient care:  Yes (Comment)  Care giving concerns:  Patient expressed to staff on previous unit that she did not feel comfortable returning home with no one there to take care of her.   Social Worker assessment / plan:  CSW met with patient at bedside to complete assessment. The patient has difficulty talking due to her trach. CSW explained reason for visit, to explain SNF placement process. The patient adamantly refuses to discuss this discharge option. She states that she has only been interested in CIR but it appears that the patient is not a candidate. CSW will continue to follow the patient, but it does not appear th at the patient is going to be agreeable to SNF at discharge.   Employment status:    Insurance information:  Managed Care PT Recommendations:  No Follow Up Information / Referral to community resources:  [place] (SNF placement option explored with patient but she adamantly refuses at this time.)  Patient/Family's Response to care:  The patient appears satisfied with the care she has received. She appears appreciative of CSW  visit.  Patient/Family's Understanding of and Emotional Response to Diagnosis, Current Treatment, and Prognosis:  The patient appears to have a good understanding of the reason for her admission and post DC needs, however the patient is not agreeable to SNF at discharge to meet her post DC needs.   Emotional Assessment Appearance:  Appears stated age Attitude/Demeanor/Rapport:  Other (Patient appears frustrated.) Affect (typically observed):  Frustrated Orientation:  Oriented to Self, Oriented to Place, Oriented to  Time, Oriented to Situation Alcohol / Substance use:  Not Applicable Psych involvement (Current and /or in the community):  No (Comment)  Discharge Needs  Concerns to be addressed:  Discharge Planning Concerns Readmission within the last 30 days:  No Current discharge risk:  Lack of support system, Chronically ill Barriers to Discharge:  Continued Medical Work up   Rigoberto Noel, LCSW 06/01/2015, 10:22 AM

## 2015-06-01 NOTE — Progress Notes (Signed)
PULMONARY / CRITICAL CARE MEDICINE   Name: Frances Medina MRN: [id number] DOB: 1960/06/04    ADMISSION DATE:  05/15/2015 CONSULTATION DATE: 05/16/2015  REFERRING MD:   CHIEF COMPLAINT: Weakness, dysphagia, SOB in setting of known MG   HISTORY OF PRESENT ILLNESS:  55 y.o. female presenting to ED on 3/14 with increased weakness, SOB, and dysphagia over the last 7 d PTA.. Of note, she was recently diagnosed with myasthenia gravis during hospitalization in January 2017, during which time she required treatment with IVIG and was started on pyridostigmine. She had another hospitalization for MG flare in February, also requiring treatment with IVIG and steroid taper, thought to have been triggered by a UTI. She was supposed to have continued prednisone upon last hospital discharge 04/13/15, but patient was not aware that she had a prescription. She called her neurologist Dr. Audelia Acton last Friday about her dysphagia, and he recommended increasing frequency of pyridostigmine 60 mg to q6h from q8h. He also noted that patient would need to start steroids or be seen in the ED. Patient had an appointment to see him 05/16/15 with plans to start prednisone, but with increasing fatigue, she decided to be evaluated in the ED. Swallowing has become more difficult since last Friday, 05/11/15. She was only able to eat "a few nuggets" today. She was, however, able to take all but her evening dose of pyridostigmine. She has been able to drink fluids but not as much as usual. She and her husband report that she has trouble with her speech if she talks too much. She also complains of chest pain under her breast that feels like "it's swollen" and cuts off her breath since yesterday. She has had rhinorrhea with post-nasal drip and cough in the middle of the night since last weak but has not taken any new OTC medications. No sick contacts.In the ED on 3/14, patient's vital signs were stable, and SpO2 was 99% on room air. She was to be  admitted to the ICU. Since presentation her swallowing fxn declined further w/ difficulty swallowing oral secretions. A NIF was recorded at -25, and her VC was 560ml. PCCM was consulted given concern for impending respiratory failure.   SUBJECTIVE: No acute events overnight. Patient remains on T-collar. No dyspnea and continued intermittent cough. Denies any chest pain or pressure. Accidentally coughed out NGT today.  REVIEW OF SYSTEMS: No fever, chills, or sweats. No nausea or abdominal pain.  VITAL SIGNS: BP 125/66 mmHg  Pulse 83  Temp(Src) 98.1 F (36.7 C) (Oral)  Resp 20  Ht 5' (1.524 m)  Wt 258 lb 6.4 oz (117.209 kg)  BMI 50.47 kg/m2  SpO2 100%  HEMODYNAMICS:    VENTILATOR SETTINGS: Vent Mode:  [-]  FiO2 (%):  [28 %] 28 %  INTAKE / OUTPUT: I/O last 3 completed shifts: In: 1917.3 [NG/GT:1667.3; IV Piggyback:250] Out: -   PHYSICAL EXAMINATION: General:  Sitting up in chair. Alert. Awake. Integument:  Warm & dry. No rash on exposed skin.  HEENT:  No scleral injection. Tracheostomy in place.  Cardiovascular:  Regular rate. No edema. No appreciable JVD.  Pulmonary:  Clear bilaterally to auscultation. Normal work of breathing on tracheostomy collar with mild secretions. Abdomen: Soft. Normal bowel sounds. Nondistended.  Neurological:  Nonfocal. CN grossly in tact. Following commands.  LABS:  BMET  Recent Labs Lab 05/30/15 0250 05/31/15 0622 06/01/15 0601  NA 144 142 139  K 3.9 3.5 3.6  CL 110 105 103  CO2 28 29 30  BUN 19 16 14   CREATININE 0.41* 0.40* 0.49  GLUCOSE 124* 126* 103*    Electrolytes  Recent Labs Lab 05/30/15 0250 05/31/15 0622 06/01/15 0601  CALCIUM 8.8* 9.0 8.8*  MG 2.2 2.0 1.9  PHOS 2.5 3.4 3.2    CBC  Recent Labs Lab 05/30/15 0250 05/31/15 0622 06/01/15 0601  WBC 14.1* 11.4* 11.1*  HGB 9.9* 9.7* 9.8*  HCT 32.6* 31.5* 31.5*  PLT 382 342 321    Coag's No results for input(s): APTT, INR in the last 168 hours.  Sepsis  Markers No results for input(s): LATICACIDVEN, PROCALCITON, O2SATVEN in the last 168 hours.  ABG No results for input(s): PHART, PCO2ART, PO2ART in the last 168 hours.  Liver Enzymes  Recent Labs Lab 05/30/15 0250 05/31/15 0622 06/01/15 0601  ALBUMIN 2.4* 2.5* 2.6*    Cardiac Enzymes No results for input(s): TROPONINI, PROBNP in the last 168 hours.  Glucose  Recent Labs Lab 05/31/15 1707 05/31/15 2229 06/01/15 0056 06/01/15 0523 06/01/15 0740 06/01/15 1203  GLUCAP 96 112* 96 102* 85 98    Imaging No results found.   STUDIES:  CXR 3/14 >> Hypoinflation of the lungs with mild bibasilar subsegmental atelectasis  Abd X-ray 3/21: Feeding tube with tip in distal duodenum. Port CXR 3/22: Right internal jugular central venous catheter in good position. Endotracheal tube in good position. Low lung volumes. Minimal basilar atelectasis. Port CXR 3/22:  Trach in place w/ tip approximately 2.5cm above carina. No new opacity. R IJ CVL in place. Bilateral effusions right greater than left.  MICROBIOLOGY: Urine Ctx 3/14:  E coli MRSA Nasal Ctx 3/15:  Negative   ANTIBIOTICS: Bactrim 3/18 - 3/20   SIGNIFICANT EVENTS: 2/10 >> D/C from hospital  3/13 >> Onset of severe SOB and weakness  3/14 >> Present to ED  3/15 >> IVIG and ETT  3/18 >> Unsuccessful extubation , reintubated.  3/21 >> Unsuccessful extubation, reintubated 3/22 >> Trach placed by JY  LINES/TUBES: Shiley #6 Cuffed (Placed by Hyman Bible) 3/22>> R IJ CVL 3/15 >>  L Nare NGT 3/21 - 3/25; 3/25 - 3/31; 3/31>>> OETT 7.0 3/15 - 3/18; 3/18 - 3/21; 3/21 - 3/22  ASSESSMENT / PLAN:  55 y.o. Female s/p acute respiratory failure due to flare of myasthenia gravis. S/P tracheostomy 3/22 by Dr. Nelda Marseille. Sutures removed by me today. Plan for tracheostomy change on 4/4 or 4/5 to allow further maturation of tract and prevent any difficulties/loss of airway given history of prior recurrent intubation. We will be available over the  weekend on an as needed basis & see patient again on 4/3.  Sonia Baller Ashok Cordia, M.D. [facility] Pager:  [phone] After 3pm or if no response, call [phone]  06/01/2015, 1:55 PM

## 2015-06-02 LAB — CBC WITH DIFFERENTIAL/PLATELET
Basophils Absolute: 0 10*3/uL (ref 0.0–0.1)
Basophils Relative: 0 %
Eosinophils Absolute: 0 10*3/uL (ref 0.0–0.7)
Eosinophils Relative: 0 %
HCT: 30.7 % — ABNORMAL LOW (ref 36.0–46.0)
Hemoglobin: 9.6 g/dL — ABNORMAL LOW (ref 12.0–15.0)
Lymphocytes Relative: 17 %
Lymphs Abs: 1.7 10*3/uL (ref 0.7–4.0)
MCH: 25.7 pg — ABNORMAL LOW (ref 26.0–34.0)
MCHC: 31.3 g/dL (ref 30.0–36.0)
MCV: 82.1 fL (ref 78.0–100.0)
Monocytes Absolute: 0.4 10*3/uL (ref 0.1–1.0)
Monocytes Relative: 4 %
Neutro Abs: 8.4 10*3/uL — ABNORMAL HIGH (ref 1.7–7.7)
Neutrophils Relative %: 79 %
Platelets: 309 10*3/uL (ref 150–400)
RBC: 3.74 MIL/uL — ABNORMAL LOW (ref 3.87–5.11)
RDW: 18.4 % — ABNORMAL HIGH (ref 11.5–15.5)
WBC: 10.6 10*3/uL — ABNORMAL HIGH (ref 4.0–10.5)

## 2015-06-02 LAB — GLUCOSE, CAPILLARY
Glucose-Capillary: 103 mg/dL — ABNORMAL HIGH (ref 65–99)
Glucose-Capillary: 120 mg/dL — ABNORMAL HIGH (ref 65–99)
Glucose-Capillary: 115 mg/dL — ABNORMAL HIGH (ref 65–99)
Glucose-Capillary: 109 mg/dL — ABNORMAL HIGH (ref 65–99)
Glucose-Capillary: 121 mg/dL — ABNORMAL HIGH (ref 65–99)
Glucose-Capillary: 107 mg/dL — ABNORMAL HIGH (ref 65–99)

## 2015-06-02 LAB — RENAL FUNCTION PANEL
Albumin: 2.5 g/dL — ABNORMAL LOW (ref 3.5–5.0)
Anion gap: 7 (ref 5–15)
BUN: 14 mg/dL (ref 6–20)
CO2: 30 mmol/L (ref 22–32)
Calcium: 8.7 mg/dL — ABNORMAL LOW (ref 8.9–10.3)
Chloride: 103 mmol/L (ref 101–111)
Creatinine, Ser: 0.48 mg/dL (ref 0.44–1.00)
GFR calc Af Amer: >60 mL/min (ref >60)
GFR calc non Af Amer: >60 mL/min (ref >60)
Glucose, Bld: 119 mg/dL — ABNORMAL HIGH (ref 65–99)
Phosphorus: 3.2 mg/dL (ref 2.5–4.6)
Potassium: 3.4 mmol/L — ABNORMAL LOW (ref 3.5–5.1)
Sodium: 140 mmol/L (ref 135–145)

## 2015-06-02 LAB — MAGNESIUM: Magnesium: 2.1 mg/dL (ref 1.7–2.4)

## 2015-06-02 MED ORDER — FENTANYL CITRATE (PF) 100 MCG/2ML IJ SOLN
25.0000 ug | INTRAMUSCULAR | Status: DC | PRN
  Administered 2015-06-02 (×3): 25 ug via INTRAVENOUS
  Filled 2015-06-02 (×3): qty 2

## 2015-06-02 MED ORDER — FLUTICASONE PROPIONATE 50 MCG/ACT NA SUSP
2.0000 | Freq: Every day | NASAL | Status: DC
  Administered 2015-06-02 – 2015-06-08 (×6): 2 via NASAL
  Filled 2015-06-02 (×2): qty 16

## 2015-06-02 MED ORDER — POTASSIUM CHLORIDE 10 MEQ/100ML IV SOLN
10.0000 meq | INTRAVENOUS | Status: AC
  Administered 2015-06-02 (×3): 10 meq via INTRAVENOUS
  Filled 2015-06-02 (×3): qty 100

## 2015-06-02 MED ORDER — POTASSIUM CHLORIDE 10 MEQ/100ML IV SOLN
INTRAVENOUS | Status: AC
  Administered 2015-06-02: 10 meq
  Filled 2015-06-02: qty 100

## 2015-06-02 NOTE — Progress Notes (Signed)
Family Medicine Teaching Service Daily Progress Note Intern Pager: [phone]  Patient name: Frances Medina Medical record number: [id number] Date of birth: November 14, 1960 Age: 55 y.o. Gender: female  Primary Care Provider: Andrena Mews, MD Consultants: Pulmonology, Neurology Code Status: Full  Pt Overview and Major Events to Date:  2/10 >> D/C from hospital  3/13 >> Onset of severe SOB and weakness  3/14 >> Presented to ED  3/15 >> Transferred to Neuro ICU, IVIG and ETT  3/18 >> Unsuccessful extubation , reintubated.  3/19 >> Completed 5 days IVIG 3/21 >> Unsuccessful extubation, reintubated 3/22 >> Trach placed, solu-medrol 3/22  3/27 >> Neurology signed off 3/28 >> Began IV vancomycin and imipenem 3/30 >> Vancomycin discontinued as trach with just oral flora 3/31 >> Lost NG tube this morning; replaced; Trach sutures removed by CCM   Assessment and Plan: Frances Medina is a 55 y.o. female presenting with myasthenia gravis exacerbation. PMH is significant for HTN,uterine fibroids, depression, anxiety, migraines and recently diagnosed myasthenia gravis, and undergoing work-up of splenomegaly with lymphadenopathy.   Myasthenia Gravis Exacerbation: Unclear trigger of symptoms, though discontinuation of steroids is most likely cause. Pt is s/p ICU stay and intubation Follows with Neurologist Dr. Audelia Acton of [facility]. She has tolerated trach collar placed 3/22.   - Neurology signed off- did not recommend further steroids. - Pulmonology following:  Removed sutures on 3/31, however did not downsize trach as they were waiting for further airway maturation; per afternoon CCM attending, would call CCM 4/3 and ask if trach change can be performed that day, as this will impede discharge.  - s/p perc trach on 28% FiO2; appreciate pulmonology recommendations - Tube feedings - Continue home prn albuterol for wheezing/SOB - Drugs to avoid in MG: aminoglycosides, clindamycin,  fluoroquinolones, ketolides, vancomycin, beta blockers, procainamide, quinidine, anti-PD-1 monoclonal antibodies, botox, chloroquine, hydroxychloroquine, magnesium, penicillamine, quinine - PT/OT consulted --> HH PT, intermittent supervision  - Dispo: [facility] vs [facility]   Presumed VAP: Will treat as VAP, given changes in vital signs and increased respiratory secretions s/p intubation. CXR 3/27 worse compared to 3/22 with decreased lung volume, possibly worsened bilateral pleural effusions and opacities concerning for infection vs. atelectasis. White count continues to improve with treatment 10.6 today.  - Continue IV imipenem through Tuesday for a 7d course. - Vanc (3/28 >> 3/30) - Obtain tracheal aspirate --> normal oropharyngeal flora - f/u blood cultures- NGTD  Feeds:  - Currently getting NG feeds - Plan to have repeat swallow study after down-sizing trach; may need MBS per SLP - Ultimately, pt may require PEG if unable to downsize trach   Headache: Headache consistent with congestion. Pt received Fentanyl 137m by RN for this pain. It appears she's been getting fentanyl daily- I suspect for this and other things - taper down Fentanyl to 229m transition to PO on 4/2 - Flonase BID for sinus congestion.    Diarrhea: Improving. Suspect from increase in pyridostigmine.  - C. Diff panel resulted as antibody positive, toxin negative, so will not start flagyl  HFpEF: ECHO 04/09/15 performed due to SOB showed EF of 60-65% and G1DD. Patient currently appears euvolemic.  Splenomegaly w/Lymphadenopathy: Being followed by Oncologist Dr. KaIrene LimboWork-up thus far has shown HIV neg, mono screen neg, elevated LDH at 395 and low haptoglobin at <10 (with recent IVIG treatment). To repeat hemolytic markers, CBC and Coombs' test later this month at follow-up. No hypermetabolic masses or lymphadenopathy seen on PET scan 05/14/15.  - Continue to monitor CBCs  Hx of HTN: stable - Not on any home  anti-hypertensives - Continue to monitor  Depression and Anxiety: - Zoloft 50 mg daily daily - Klonopin 0.5 mg BID  E. Coli UTI: s/p 3 days bactrim  FEN/GI: Tube feeds, pepcid Prophylaxis: Heparin  Disposition: Plan to decrease size of tracheostomy then place PEG tube if needed; anticipate dispo to in-hospital LT[facility] vs [facility]  Subjective:  Patient doing well. Notes secretions stable but still copious. No SOB, abdominal pain. Notes continued diarrhea but only with feeds, improved from previous. Notes facial pain, specifically behind eyes and around nose that feels like pressure.  Objective: Temp:  [98.3 F (36.8 C)-98.8 F (37.1 C)] 98.8 F (37.1 C) (04/01 0358) Pulse Rate:  [81-99] 99 (04/01 0358) Resp:  [16-22] 18 (04/01 0358) BP: (95-117)/(61-83) 95/83 mmHg (04/01 0358) SpO2:  [97 %-100 %] 98 % (04/01 0921) FiO2 (%):  [28 %] 28 % (04/01 0921) Weight:  [257 lb 12.8 oz (116.937 kg)] 257 lb 12.8 oz (116.937 kg) (04/01 0451) Physical Exam: General: Obese female, sleeping in recliner, in NAD. Pleasant Cardiovascular: RRR, S1, S2, no m/r/g Chest: CTAB without wheezing, rhonchi, or crackles, trach collar in place and clean.  Abdomen: +BS, soft, NT, ND Extremities: Moves all spontaneously, strength grossly intact   Laboratory:  Recent Labs Lab 05/31/15 0622 06/01/15 0601 06/02/15 0644  WBC 11.4* 11.1* 10.6*  HGB 9.7* 9.8* 9.6*  HCT 31.5* 31.5* 30.7*  PLT 342 321 309    Recent Labs Lab 05/31/15 0622 06/01/15 0601 06/02/15 0644  NA 142 139 140  K 3.5 3.6 3.4*  CL 105 103 103  CO2 '29 30 30  ' BUN '16 14 14  ' CREATININE 0.40* 0.49 0.48  CALCIUM 9.0 8.8* 8.7*  GLUCOSE 126* 103* 119*    Imaging/Diagnostic Tests: Dg Chest 1 View  05/28/2015  CLINICAL DATA:  Patient with respiratory failure. Shortness of breath. EXAM: CHEST 1 VIEW COMPARISON:  Chest radiograph 05/23/2015 FINDINGS: Tracheostomy tube terminates in the mid trachea. Enteric tube courses inferior to the  diaphragm. Right upper extremity PICC line is present with tip projecting over the right atrium. Low lung volumes. Stable cardiac and mediastinal contours. Small bilateral pleural effusions. Bibasilar airspace opacities. No pneumothorax. IMPRESSION: Right upper extremity PICC line is present with tip projecting over the right atrium. Low lung volumes, small bilateral pleural effusions and underlying opacities favored to represent atelectasis or infection. Associated interstitial pulmonary edema. Electronically Signed   By: Lovey Newcomer M.D.   On: 05/28/2015 13:06   Dg Chest 2 View  05/15/2015  CLINICAL DATA:  Shortness of breath. EXAM: CHEST  2 VIEW COMPARISON:  April 07, 2015. FINDINGS: The heart size and mediastinal contours are within normal limits. Hypoinflation of the lungs is noted. Mild bibasilar subsegmental atelectasis is noted. No pneumothorax or pleural effusion is noted. The visualized skeletal structures are unremarkable. IMPRESSION: Hypoinflation of the lungs with mild bibasilar subsegmental atelectasis. Electronically Signed   By: Marijo Conception, M.D.   On: 05/15/2015 21:06   Nm Pet Image Initial (pi) Skull Base To Thigh  05/14/2015  CLINICAL DATA:  Initial treatment strategy for lymphadenopathy and splenomegaly. Possibly lymphoproliferative disease. Myasthenia gravis. EXAM: NUCLEAR MEDICINE PET SKULL BASE TO THIGH TECHNIQUE: 14.8 mCi F-18 FDG was injected intravenously. Full-ring PET imaging was performed from the skull base to thigh after the radiotracer. CT data was obtained and used for attenuation correction and anatomic localization. FASTING BLOOD GLUCOSE:  Value: 97 mg/dl COMPARISON:  Chest CTA on 04/07/2015  FINDINGS: NECK No hypermetabolic lymph nodes in the neck. CHEST No hypermetabolic mediastinal or hilar nodes. No suspicious pulmonary nodules on the CT scan. ABDOMEN/PELVIS No abnormal hypermetabolic activity within the liver, pancreas, adrenal glands, or spleen. No evidence of  splenomegaly. No hypermetabolic lymph nodes in the abdomen or pelvis. SKELETON No focal hypermetabolic activity to suggest skeletal metastasis. Mild diffusely increased hypermetabolic activity seen throughout the axial and appendicular bone marrow. IMPRESSION: No hypermetabolic masses or lymphadenopathy identified. Mild diffusely increased metabolic activity throughout the bone marrow, without focal metastatic lesions. This finding is of uncertain clinical significance, and is likely due to marrow stimulation. Bone marrow biopsy could be considered further evaluation if clinically warranted. Electronically Signed   By: Earle Gell M.D.   On: 05/14/2015 16:33   Dg Chest Port 1 View  05/23/2015  CLINICAL DATA:  Tracheostomy placement. EXAM: PORTABLE CHEST 1 VIEW COMPARISON:  Study obtained earlier in the day FINDINGS: There is now a tracheostomy present with the tracheostomy catheter tip 2.4 cm above the carina. Feeding tube tip is below the diaphragm. Central catheter tip is at the cavoatrial junction. No pneumothorax. There are bilateral pleural effusions, larger on the right than on the left. There is atelectatic change in the bases. Heart is mildly enlarged. The pulmonary vascularity appears normal. No adenopathy. No bone lesions. IMPRESSION: Tube and catheter positions as described without pneumothorax. Mild cardiomegaly with bilateral pleural effusions. Suspect a degree of congestive heart failure. There is bibasilar atelectasis. Electronically Signed   By: Lowella Grip III M.D.   On: 05/23/2015 16:17   Portable Chest Xray  05/23/2015  CLINICAL DATA:  55 year old female female with respiratory failure. Myasthenia gravis. Initial encounter. EXAM: PORTABLE CHEST 1 VIEW COMPARISON:  05/22/2015 and earlier. FINDINGS: Portable AP semi upright view at 0604 hours. Stable endotracheal tube tip at the level the clavicles. Enteric tube now in place, courses to the abdomen and tip not included. Multiple EKG  leads and wires also overlie the chest. Stable right IJ central line. Continued low lung volumes with right greater than left basilar veiling opacity. No pneumothorax. No acute pulmonary edema. Mediastinal contours remain within normal limits. IMPRESSION: 1. Enteric tube placed and courses to the abdomen, tip not included. Otherwise, stable lines and tubes. 2. Stable ventilation. Low lung volumes with bibasilar opacity probably reflecting atelectasis and small effusions. Electronically Signed   By: Genevie Ann M.D.   On: 05/23/2015 07:36   Portable Chest Xray  05/22/2015  CLINICAL DATA:  Re-intubation today EXAM: PORTABLE CHEST 1 VIEW COMPARISON:  05/22/2015 FINDINGS: Unchanged position of endotracheal tube and internal jugular central line. Cardiac enlargement stable. Bibasilar hypoventilatory change with low lung volumes. IMPRESSION: Bilateral lower lobe atelectasis. Findings similar to prior study, with mildly increased opacity at the bases. Electronically Signed   By: Skipper Cliche M.D.   On: 05/22/2015 12:14   Dg Chest Port 1 View  05/22/2015  CLINICAL DATA:  Acute respiratory failure EXAM: PORTABLE CHEST 1 VIEW COMPARISON:  05/21/15 FINDINGS: Cardiomediastinal silhouette is stable. Endotracheal tube in place with tip 2.5 cm above the carina. NG tube in place. Right IJ central line is unchanged in position. Central mild vascular congestion without convincing pulmonary edema persistent mild basilar atelectasis. No segmental infiltrate. IMPRESSION: Stable support apparatus. Central mild vascular congestion without convincing pulmonary edema. Persistent bilateral basilar atelectasis. Electronically Signed   By: Lahoma Crocker M.D.   On: 05/22/2015 07:50   Dg Chest Port 1 View  05/21/2015  CLINICAL DATA:  Acute respiratory failure EXAM: PORTABLE CHEST 1 VIEW COMPARISON:  05/19/2015 FINDINGS: Endotracheal tube terminates 5 cm above the carina. Mild bibasilar opacities, likely atelectasis. No frank interstitial  edema. No pleural effusion or pneumothorax. The heart is normal in size. Right IJ venous catheter terminates the cavoatrial junction. Enteric tube courses into the mid stomach. IMPRESSION: Endotracheal tube terminates 5 cm above the carina. Mild bibasilar opacities, likely atelectasis. No frank interstitial edema. Electronically Signed   By: Julian Hy M.D.   On: 05/21/2015 10:45   Dg Chest Port 1 View  05/19/2015  CLINICAL DATA:  Hypoxia EXAM: PORTABLE CHEST 1 VIEW COMPARISON:  May 19, 2015 study obtained earlier in the day FINDINGS: Endotracheal tube tip is 9 mm above the carina. Central catheter tip is at the cavoatrial junction. Nasogastric tube tip and side port are below the diaphragm in the stomach. No pneumothorax. There is no edema or consolidation. Heart size and pulmonary vascularity are within normal limits. No adenopathy. No bone lesions apparent. IMPRESSION: Tube and catheter positions as described without pneumothorax. Note that the endotracheal tube tip is close to the carina. It may be prudent to withdrawal endotracheal tube approximately 3 cm. No edema or consolidation. These results will be called to the ordering clinician or representative by the Radiologist Assistant, and communication documented in the PACS or zVision Dashboard. Electronically Signed   By: Lowella Grip III M.D.   On: 05/19/2015 11:16   Dg Chest Port 1 View  05/19/2015  CLINICAL DATA:  Acute respiratory failure EXAM: PORTABLE CHEST 1 VIEW COMPARISON:  May 18, 2015 FINDINGS: The ETT is in good position. No other change in support apparatus. No pneumothorax. Bibasilar atelectasis, right greater than left. No other interval changes. IMPRESSION: The ETT is in good position.  No other change. Electronically Signed   By: Dorise Bullion III M.D   On: 05/19/2015 08:11   Dg Chest Port 1 View  05/18/2015  CLINICAL DATA:  Acute respiratory failure.  Endotracheal tube. EXAM: PORTABLE CHEST 1 VIEW COMPARISON:   05/16/2015 FINDINGS: Endotracheal tube terminates approximately 2.5 cm above the carina. Right jugular central venous catheter terminates over the cavoatrial junction. Enteric tube courses into the left upper abdomen with tip not imaged. Lung volumes remain diminished with persistent mild patchy opacities in both lung bases. Trace bilateral pleural effusions are questioned. No pneumothorax. IMPRESSION: 1. Support devices as above. 2. Low lung volumes with persistent mild bibasilar opacities which may reflect atelectasis. Electronically Signed   By: Logan Bores M.D.   On: 05/18/2015 08:21   Dg Chest Portable 1 View  05/16/2015  CLINICAL DATA:  Status post central line placement EXAM: PORTABLE CHEST 1 VIEW COMPARISON:  05/15/2015 FINDINGS: Cardiac shadow is stable. An endotracheal tube is now seen and lies just above the carina and should be withdrawn 1-2 cm. A nasogastric catheter is seen coiled within the stomach. A right jugular central line is noted at the cavoatrial junction. No pneumothorax is seen. Bilateral pleural effusions are noted with bibasilar patchy opacities. No bony abnormality is seen. IMPRESSION: Endotracheal tube at the level of the carina and should be withdrawn 1-2 cm. No pneumothorax following central line placement. Small bilateral pleural effusions and bibasilar patchy opacities. These results will be called to the ordering clinician or representative by the Radiologist Assistant, and communication documented in the PACS or zVision Dashboard. Electronically Signed   By: Inez Catalina M.D.   On: 05/16/2015 13:35   Dg Abd Portable 1v  06/01/2015  CLINICAL DATA:  Enteric tube placement EXAM: PORTABLE ABDOMEN - 1 VIEW COMPARISON:  05/26/2015 abdominal radiograph FINDINGS: Weighted enteric tube traverses the stomach and loops in the distal duodenum, with the tip of likely in the proximal jejunum versus fourth portion of the duodenum. No dilated small bowel loops. No evidence of pneumatosis or  pneumoperitoneum. IMPRESSION: Weighted enteric tube terminates in a post pyloric position, either in the proximal jejunum or fourth portion of the duodenum. Electronically Signed   By: Ilona Sorrel M.D.   On: 06/01/2015 14:56   Dg Abd Portable 1v  05/26/2015  CLINICAL DATA:  Encounter for NG tube placement EXAM: PORTABLE ABDOMEN - 1 VIEW COMPARISON:  None. FINDINGS: The feeding tube extends into the stomach. The tip is within the fourth portion duodenum not changed from prior. IMPRESSION: No change in position of feeding tube. Electronically Signed   By: Suzy Bouchard M.D.   On: 05/26/2015 15:31   Dg Abd Portable 1v  05/22/2015  CLINICAL DATA:  Feeding tube placement EXAM: PORTABLE ABDOMEN - 1 VIEW COMPARISON:  05/19/2015 FINDINGS: NG tube is been exchange for a feeding tube. Feeding tube tip is in the distal fourth portion of the duodenum. Normal bowel gas pattern. No significant dilatation or obstruction. No acute osseous finding. IMPRESSION: Feeding tube tip distal duodenum. Electronically Signed   By: Jerilynn Mages.  Shick M.D.   On: 05/22/2015 15:29   Dg Abd Portable 1v  05/19/2015  CLINICAL DATA:  Evaluate OG tube. EXAM: PORTABLE ABDOMEN - 1 VIEW COMPARISON:  May 16, 2015 FINDINGS: The OG tube terminates in the distal stomach. No other acute abnormalities identified. IMPRESSION: Appropriate placement of OG tube. Electronically Signed   By: Dorise Bullion III M.D   On: 05/19/2015 11:17   Dg Abd Portable 1v  05/16/2015  CLINICAL DATA:  OG tube placement EXAM: PORTABLE ABDOMEN - 1 VIEW COMPARISON:  None. FINDINGS: Enteric tube is in place with the tip in the very pyloric region and the side port in the distal stomach. Nonobstructive bowel gas pattern. IMPRESSION: Enteric tube tip in the peripyloric region. Electronically Signed   By: Rolm Baptise M.D.   On: 05/16/2015 13:34    Archie Patten, MD 06/02/2015, 10:07 AM PGY-2, [facility] Intern pager: [phone], text pages welcome

## 2015-06-03 DIAGNOSIS — G7000 Myasthenia gravis without (acute) exacerbation: Secondary | ICD-10-CM

## 2015-06-03 LAB — RENAL FUNCTION PANEL
Albumin: 2.6 g/dL — ABNORMAL LOW (ref 3.5–5.0)
Anion gap: 7 (ref 5–15)
BUN: 11 mg/dL (ref 6–20)
CO2: 29 mmol/L (ref 22–32)
Calcium: 8.8 mg/dL — ABNORMAL LOW (ref 8.9–10.3)
Chloride: 106 mmol/L (ref 101–111)
Creatinine, Ser: 0.41 mg/dL — ABNORMAL LOW (ref 0.44–1.00)
GFR calc Af Amer: >60 mL/min (ref >60)
GFR calc non Af Amer: >60 mL/min (ref >60)
Glucose, Bld: 116 mg/dL — ABNORMAL HIGH (ref 65–99)
Phosphorus: 2.8 mg/dL (ref 2.5–4.6)
Potassium: 3.7 mmol/L (ref 3.5–5.1)
Sodium: 142 mmol/L (ref 135–145)

## 2015-06-03 LAB — CBC WITH DIFFERENTIAL/PLATELET
Basophils Absolute: 0 10*3/uL (ref 0.0–0.1)
Basophils Relative: 0 %
Eosinophils Absolute: 0 10*3/uL (ref 0.0–0.7)
Eosinophils Relative: 0 %
HCT: 31.8 % — ABNORMAL LOW (ref 36.0–46.0)
Hemoglobin: 9.4 g/dL — ABNORMAL LOW (ref 12.0–15.0)
Lymphocytes Relative: 19 %
Lymphs Abs: 1.7 10*3/uL (ref 0.7–4.0)
MCH: 24.3 pg — ABNORMAL LOW (ref 26.0–34.0)
MCHC: 29.6 g/dL — ABNORMAL LOW (ref 30.0–36.0)
MCV: 82.2 fL (ref 78.0–100.0)
Monocytes Absolute: 0.3 10*3/uL (ref 0.1–1.0)
Monocytes Relative: 4 %
Neutro Abs: 7 10*3/uL (ref 1.7–7.7)
Neutrophils Relative %: 77 %
Platelets: 319 10*3/uL (ref 150–400)
RBC: 3.87 MIL/uL (ref 3.87–5.11)
RDW: 18.6 % — ABNORMAL HIGH (ref 11.5–15.5)
WBC: 9 10*3/uL (ref 4.0–10.5)

## 2015-06-03 LAB — CULTURE, BLOOD (ROUTINE X 2)
Culture: NO GROWTH
Culture: NO GROWTH

## 2015-06-03 LAB — GLUCOSE, CAPILLARY
Glucose-Capillary: 121 mg/dL — ABNORMAL HIGH (ref 65–99)
Glucose-Capillary: 113 mg/dL — ABNORMAL HIGH (ref 65–99)

## 2015-06-03 LAB — MAGNESIUM: Magnesium: 2.2 mg/dL (ref 1.7–2.4)

## 2015-06-03 NOTE — Progress Notes (Signed)
Family Medicine Teaching Service Daily Progress Note Intern Pager: [phone]  Patient name: KLARA STJAMES Medical record number: [id number] Date of birth: 05-28-1960 Age: 55 y.o. Gender: female  Primary Care Provider: Andrena Mews, MD Consultants: Pulmonology, Neurology Code Status: Full  Pt Overview and Major Events to Date:  2/10 >> D/C from hospital  3/13 >> Onset of severe SOB and weakness  3/14 >> Presented to ED  3/15 >> Transferred to Neuro ICU, IVIG and ETT  3/18 >> Unsuccessful extubation , reintubated.  3/19 >> Completed 5 days IVIG 3/21 >> Unsuccessful extubation, reintubated 3/22 >> Trach placed, solu-medrol 3/22  3/27 >> Neurology signed off 3/28 >> Began IV vancomycin and imipenem 3/30 >> Vancomycin discontinued as trach with just oral flora 3/31 >> Lost NG tube this morning; replaced; Trach sutures removed by CCM   Assessment and Plan: ALESHA JAFFEE is a 55 y.o. female presenting with myasthenia gravis exacerbation. PMH is significant for HTN,uterine fibroids, depression, anxiety, migraines and recently diagnosed myasthenia gravis, and undergoing work-up of splenomegaly with lymphadenopathy.   Myasthenia Gravis Exacerbation: Unclear trigger of symptoms, though discontinuation of steroids is most likely cause. Pt is s/p ICU stay and intubation Follows with Neurologist Dr. Audelia Acton of [facility]. She has tolerated trach collar placed 3/22.   - Neurology signed off- did not recommend further steroids. - Pulmonology following:  Removed sutures on 3/31, however did not downsize trach as they were waiting for further airway maturation; per CCM attending, would call CCM 4/3 and ask if trach change can be performed that day, as this will impede discharge.  - s/p perc trach on 28% FiO2; appreciate pulmonology recommendations - Tube feedings - Continue home prn albuterol for wheezing/SOB - Drugs to avoid in MG: aminoglycosides, clindamycin, fluoroquinolones,  ketolides, vancomycin, beta blockers, procainamide, quinidine, anti-PD-1 monoclonal antibodies, botox, chloroquine, hydroxychloroquine, magnesium, penicillamine, quinine - PT/OT consulted --> HH PT, intermittent supervision  - Dispo: [facility] vs [facility]   Presumed VAP: Will treat as VAP, given changes in vital signs and increased respiratory secretions s/p intubation. CXR 3/27 worse compared to 3/22 with decreased lung volume, possibly worsened bilateral pleural effusions and opacities concerning for infection vs. atelectasis. White count continues to improve with treatment 10.6 today.  - Continue IV imipenem through 4/4 for a 7d course. - Vanc (3/28 >> 3/30) - Obtain tracheal aspirate --> normal oropharyngeal flora - f/u blood cultures- NGTD  Feeds:  - Currently getting NG feeds - Plan to have repeat swallow study after down-sizing trach; may need MBS per SLP - Ultimately, pt may require PEG if unable to downsize trach   Headache: Headache consistent with congestion. Pt received Fentanyl 191m by RN for this pain. It appears she's been getting fentanyl daily- I suspect for this and other things - taper down Fentanyl to 253m transition to PO on 4/2 - Flonase BID for sinus congestion.    Diarrhea: Improving. Suspect from increase in pyridostigmine.  - C. Diff panel resulted as antibody positive, toxin negative, so will not start flagyl  HFpEF: ECHO 04/09/15 performed due to SOB showed EF of 60-65% and G1DD. Patient currently appears euvolemic.  Splenomegaly w/Lymphadenopathy: Being followed by Oncologist Dr. KaIrene LimboWork-up thus far has shown HIV neg, mono screen neg, elevated LDH at 395 and low haptoglobin at <10 (with recent IVIG treatment). To repeat hemolytic markers, CBC and Coombs' test later this month at follow-up. No hypermetabolic masses or lymphadenopathy seen on PET scan 05/14/15.  - Continue to monitor CBCs  Hx of HTN: stable - Not on any home anti-hypertensives - Continue to  monitor  Depression and Anxiety: - Zoloft 50 mg daily daily - Klonopin 0.5 mg BID  E. Coli UTI: s/p 3 days bactrim  FEN/GI: Tube feeds, pepcid Prophylaxis: Heparin  Disposition: Plan to decrease size of tracheostomy then place PEG tube if needed; anticipate dispo to in-hospital LTAC vs [facility]  Subjective:  Doing well this morning. No complaints. No SOB or abdominal pain.   Objective: Temp:  [98.3 F (36.8 C)-98.9 F (37.2 C)] 98.3 F (36.8 C) (04/02 0450) Pulse Rate:  [80-111] 111 (04/02 0450) Resp:  [18-22] 22 (04/02 0450) BP: (112-134)/(65-96) 113/71 mmHg (04/02 0450) SpO2:  [98 %-100 %] 99 % (04/02 0450) FiO2 (%):  [28 %] 28 % (04/02 0450) Weight:  [252 lb 6.8 oz (114.5 kg)] 252 lb 6.8 oz (114.5 kg) (04/02 0450) Physical Exam: General: Obese female, sleeping in recliner, in NAD. Pleasant Cardiovascular: RRR, S1, S2, no m/r/g Chest: CTAB without wheezing, rhonchi, or crackles, trach collar in place and clean.  Abdomen: +BS, soft, NT, ND Extremities: Moves all spontaneously, strength grossly intact   Laboratory:  Recent Labs Lab 06/01/15 0601 06/02/15 0644 06/03/15 0601  WBC 11.1* 10.6* 9.0  HGB 9.8* 9.6* 9.4*  HCT 31.5* 30.7* 31.8*  PLT 321 309 319    Recent Labs Lab 06/01/15 0601 06/02/15 0644 06/03/15 0601  NA 139 140 142  K 3.6 3.4* 3.7  CL 103 103 106  CO2 _0 BUN _1 CREATININE 0.49 0.48 0.41*  CALCIUM 8.8* 8.7* 8.8*  GLUCOSE 103* 119* 116*    Imaging/Diagnostic Tests: Dg Chest 1 View  05/28/2015  CLINICAL DATA:  Patient with respiratory failure. Shortness of breath. EXAM: CHEST 1 VIEW COMPARISON:  Chest radiograph 05/23/2015 FINDINGS: Tracheostomy tube terminates in the mid trachea. Enteric tube courses inferior to the diaphragm. Right upper extremity PICC line is present with tip projecting over the right atrium. Low lung volumes. Stable cardiac and mediastinal contours. Small bilateral pleural effusions. Bibasilar airspace  opacities. No pneumothorax. IMPRESSION: Right upper extremity PICC line is present with tip projecting over the right atrium. Low lung volumes, small bilateral pleural effusions and underlying opacities favored to represent atelectasis or infection. Associated interstitial pulmonary edema. Electronically Signed   By: Lovey Newcomer M.D.   On: 05/28/2015 13:06   Dg Chest 2 View  05/15/2015  CLINICAL DATA:  Shortness of breath. EXAM: CHEST  2 VIEW COMPARISON:  April 07, 2015. FINDINGS: The heart size and mediastinal contours are within normal limits. Hypoinflation of the lungs is noted. Mild bibasilar subsegmental atelectasis is noted. No pneumothorax or pleural effusion is noted. The visualized skeletal structures are unremarkable. IMPRESSION: Hypoinflation of the lungs with mild bibasilar subsegmental atelectasis. Electronically Signed   By: Marijo Conception, M.D.   On: 05/15/2015 21:06   Nm Pet Image Initial (pi) Skull Base To Thigh  05/14/2015  CLINICAL DATA:  Initial treatment strategy for lymphadenopathy and splenomegaly. Possibly lymphoproliferative disease. Myasthenia gravis. EXAM: NUCLEAR MEDICINE PET SKULL BASE TO THIGH TECHNIQUE: 14.8 mCi F-18 FDG was injected intravenously. Full-ring PET imaging was performed from the skull base to thigh after the radiotracer. CT data was obtained and used for attenuation correction and anatomic localization. FASTING BLOOD GLUCOSE:  Value: 97 mg/dl COMPARISON:  Chest CTA on 04/07/2015 FINDINGS: NECK No hypermetabolic lymph nodes in the neck. CHEST No hypermetabolic mediastinal or hilar nodes. No suspicious pulmonary nodules on the CT scan.  ABDOMEN/PELVIS No abnormal hypermetabolic activity within the liver, pancreas, adrenal glands, or spleen. No evidence of splenomegaly. No hypermetabolic lymph nodes in the abdomen or pelvis. SKELETON No focal hypermetabolic activity to suggest skeletal metastasis. Mild diffusely increased hypermetabolic activity seen throughout the  axial and appendicular bone marrow. IMPRESSION: No hypermetabolic masses or lymphadenopathy identified. Mild diffusely increased metabolic activity throughout the bone marrow, without focal metastatic lesions. This finding is of uncertain clinical significance, and is likely due to marrow stimulation. Bone marrow biopsy could be considered further evaluation if clinically warranted. Electronically Signed   By: Earle Gell M.D.   On: 05/14/2015 16:33   Dg Chest Port 1 View  05/23/2015  CLINICAL DATA:  Tracheostomy placement. EXAM: PORTABLE CHEST 1 VIEW COMPARISON:  Study obtained earlier in the day FINDINGS: There is now a tracheostomy present with the tracheostomy catheter tip 2.4 cm above the carina. Feeding tube tip is below the diaphragm. Central catheter tip is at the cavoatrial junction. No pneumothorax. There are bilateral pleural effusions, larger on the right than on the left. There is atelectatic change in the bases. Heart is mildly enlarged. The pulmonary vascularity appears normal. No adenopathy. No bone lesions. IMPRESSION: Tube and catheter positions as described without pneumothorax. Mild cardiomegaly with bilateral pleural effusions. Suspect a degree of congestive heart failure. There is bibasilar atelectasis. Electronically Signed   By: Lowella Grip III M.D.   On: 05/23/2015 16:17   Portable Chest Xray  05/23/2015  CLINICAL DATA:  55 year old female female with respiratory failure. Myasthenia gravis. Initial encounter. EXAM: PORTABLE CHEST 1 VIEW COMPARISON:  05/22/2015 and earlier. FINDINGS: Portable AP semi upright view at 0604 hours. Stable endotracheal tube tip at the level the clavicles. Enteric tube now in place, courses to the abdomen and tip not included. Multiple EKG leads and wires also overlie the chest. Stable right IJ central line. Continued low lung volumes with right greater than left basilar veiling opacity. No pneumothorax. No acute pulmonary edema. Mediastinal contours  remain within normal limits. IMPRESSION: 1. Enteric tube placed and courses to the abdomen, tip not included. Otherwise, stable lines and tubes. 2. Stable ventilation. Low lung volumes with bibasilar opacity probably reflecting atelectasis and small effusions. Electronically Signed   By: Genevie Ann M.D.   On: 05/23/2015 07:36   Portable Chest Xray  05/22/2015  CLINICAL DATA:  Re-intubation today EXAM: PORTABLE CHEST 1 VIEW COMPARISON:  05/22/2015 FINDINGS: Unchanged position of endotracheal tube and internal jugular central line. Cardiac enlargement stable. Bibasilar hypoventilatory change with low lung volumes. IMPRESSION: Bilateral lower lobe atelectasis. Findings similar to prior study, with mildly increased opacity at the bases. Electronically Signed   By: Skipper Cliche M.D.   On: 05/22/2015 12:14   Dg Chest Port 1 View  05/22/2015  CLINICAL DATA:  Acute respiratory failure EXAM: PORTABLE CHEST 1 VIEW COMPARISON:  05/21/15 FINDINGS: Cardiomediastinal silhouette is stable. Endotracheal tube in place with tip 2.5 cm above the carina. NG tube in place. Right IJ central line is unchanged in position. Central mild vascular congestion without convincing pulmonary edema persistent mild basilar atelectasis. No segmental infiltrate. IMPRESSION: Stable support apparatus. Central mild vascular congestion without convincing pulmonary edema. Persistent bilateral basilar atelectasis. Electronically Signed   By: Lahoma Crocker M.D.   On: 05/22/2015 07:50   Dg Chest Port 1 View  05/21/2015  CLINICAL DATA:  Acute respiratory failure EXAM: PORTABLE CHEST 1 VIEW COMPARISON:  05/19/2015 FINDINGS: Endotracheal tube terminates 5 cm above the carina. Mild bibasilar opacities, likely  atelectasis. No frank interstitial edema. No pleural effusion or pneumothorax. The heart is normal in size. Right IJ venous catheter terminates the cavoatrial junction. Enteric tube courses into the mid stomach. IMPRESSION: Endotracheal tube terminates  5 cm above the carina. Mild bibasilar opacities, likely atelectasis. No frank interstitial edema. Electronically Signed   By: Julian Hy M.D.   On: 05/21/2015 10:45   Dg Chest Port 1 View  05/19/2015  CLINICAL DATA:  Hypoxia EXAM: PORTABLE CHEST 1 VIEW COMPARISON:  May 19, 2015 study obtained earlier in the day FINDINGS: Endotracheal tube tip is 9 mm above the carina. Central catheter tip is at the cavoatrial junction. Nasogastric tube tip and side port are below the diaphragm in the stomach. No pneumothorax. There is no edema or consolidation. Heart size and pulmonary vascularity are within normal limits. No adenopathy. No bone lesions apparent. IMPRESSION: Tube and catheter positions as described without pneumothorax. Note that the endotracheal tube tip is close to the carina. It may be prudent to withdrawal endotracheal tube approximately 3 cm. No edema or consolidation. These results will be called to the ordering clinician or representative by the Radiologist Assistant, and communication documented in the PACS or zVision Dashboard. Electronically Signed   By: Lowella Grip III M.D.   On: 05/19/2015 11:16   Dg Chest Port 1 View  05/19/2015  CLINICAL DATA:  Acute respiratory failure EXAM: PORTABLE CHEST 1 VIEW COMPARISON:  May 18, 2015 FINDINGS: The ETT is in good position. No other change in support apparatus. No pneumothorax. Bibasilar atelectasis, right greater than left. No other interval changes. IMPRESSION: The ETT is in good position.  No other change. Electronically Signed   By: Dorise Bullion III M.D   On: 05/19/2015 08:11   Dg Chest Port 1 View  05/18/2015  CLINICAL DATA:  Acute respiratory failure.  Endotracheal tube. EXAM: PORTABLE CHEST 1 VIEW COMPARISON:  05/16/2015 FINDINGS: Endotracheal tube terminates approximately 2.5 cm above the carina. Right jugular central venous catheter terminates over the cavoatrial junction. Enteric tube courses into the left upper abdomen with  tip not imaged. Lung volumes remain diminished with persistent mild patchy opacities in both lung bases. Trace bilateral pleural effusions are questioned. No pneumothorax. IMPRESSION: 1. Support devices as above. 2. Low lung volumes with persistent mild bibasilar opacities which may reflect atelectasis. Electronically Signed   By: Logan Bores M.D.   On: 05/18/2015 08:21   Dg Chest Portable 1 View  05/16/2015  CLINICAL DATA:  Status post central line placement EXAM: PORTABLE CHEST 1 VIEW COMPARISON:  05/15/2015 FINDINGS: Cardiac shadow is stable. An endotracheal tube is now seen and lies just above the carina and should be withdrawn 1-2 cm. A nasogastric catheter is seen coiled within the stomach. A right jugular central line is noted at the cavoatrial junction. No pneumothorax is seen. Bilateral pleural effusions are noted with bibasilar patchy opacities. No bony abnormality is seen. IMPRESSION: Endotracheal tube at the level of the carina and should be withdrawn 1-2 cm. No pneumothorax following central line placement. Small bilateral pleural effusions and bibasilar patchy opacities. These results will be called to the ordering clinician or representative by the Radiologist Assistant, and communication documented in the PACS or zVision Dashboard. Electronically Signed   By: Inez Catalina M.D.   On: 05/16/2015 13:35   Dg Abd Portable 1v  06/01/2015  CLINICAL DATA:  Enteric tube placement EXAM: PORTABLE ABDOMEN - 1 VIEW COMPARISON:  05/26/2015 abdominal radiograph FINDINGS: Weighted enteric tube traverses the stomach  and loops in the distal duodenum, with the tip of likely in the proximal jejunum versus fourth portion of the duodenum. No dilated small bowel loops. No evidence of pneumatosis or pneumoperitoneum. IMPRESSION: Weighted enteric tube terminates in a post pyloric position, either in the proximal jejunum or fourth portion of the duodenum. Electronically Signed   By: Ilona Sorrel M.D.   On: 06/01/2015  14:56   Dg Abd Portable 1v  05/26/2015  CLINICAL DATA:  Encounter for NG tube placement EXAM: PORTABLE ABDOMEN - 1 VIEW COMPARISON:  None. FINDINGS: The feeding tube extends into the stomach. The tip is within the fourth portion duodenum not changed from prior. IMPRESSION: No change in position of feeding tube. Electronically Signed   By: Suzy Bouchard M.D.   On: 05/26/2015 15:31   Dg Abd Portable 1v  05/22/2015  CLINICAL DATA:  Feeding tube placement EXAM: PORTABLE ABDOMEN - 1 VIEW COMPARISON:  05/19/2015 FINDINGS: NG tube is been exchange for a feeding tube. Feeding tube tip is in the distal fourth portion of the duodenum. Normal bowel gas pattern. No significant dilatation or obstruction. No acute osseous finding. IMPRESSION: Feeding tube tip distal duodenum. Electronically Signed   By: Jerilynn Mages.  Shick M.D.   On: 05/22/2015 15:29   Dg Abd Portable 1v  05/19/2015  CLINICAL DATA:  Evaluate OG tube. EXAM: PORTABLE ABDOMEN - 1 VIEW COMPARISON:  May 16, 2015 FINDINGS: The OG tube terminates in the distal stomach. No other acute abnormalities identified. IMPRESSION: Appropriate placement of OG tube. Electronically Signed   By: Dorise Bullion III M.D   On: 05/19/2015 11:17   Dg Abd Portable 1v  05/16/2015  CLINICAL DATA:  OG tube placement EXAM: PORTABLE ABDOMEN - 1 VIEW COMPARISON:  None. FINDINGS: Enteric tube is in place with the tip in the very pyloric region and the side port in the distal stomach. Nonobstructive bowel gas pattern. IMPRESSION: Enteric tube tip in the peripyloric region. Electronically Signed   By: Rolm Baptise M.D.   On: 05/16/2015 13:34    Vivi Barrack, MD 06/03/2015, 8:14 AM PGY-2, [place] Intern pager: [phone], text pages welcome

## 2015-06-04 DIAGNOSIS — Z930 Tracheostomy status: Secondary | ICD-10-CM | POA: Insufficient documentation

## 2015-06-04 LAB — CBC WITH DIFFERENTIAL/PLATELET
Basophils Absolute: 0 10*3/uL (ref 0.0–0.1)
Basophils Relative: 0 %
Eosinophils Absolute: 0 10*3/uL (ref 0.0–0.7)
Eosinophils Relative: 0 %
HCT: 32.9 % — ABNORMAL LOW (ref 36.0–46.0)
Hemoglobin: 9.8 g/dL — ABNORMAL LOW (ref 12.0–15.0)
Lymphocytes Relative: 19 %
Lymphs Abs: 1.7 10*3/uL (ref 0.7–4.0)
MCH: 24.7 pg — ABNORMAL LOW (ref 26.0–34.0)
MCHC: 29.8 g/dL — ABNORMAL LOW (ref 30.0–36.0)
MCV: 82.9 fL (ref 78.0–100.0)
Monocytes Absolute: 0.3 10*3/uL (ref 0.1–1.0)
Monocytes Relative: 3 %
Neutro Abs: 6.8 10*3/uL (ref 1.7–7.7)
Neutrophils Relative %: 78 %
Platelets: 336 10*3/uL (ref 150–400)
RBC: 3.97 MIL/uL (ref 3.87–5.11)
RDW: 18.7 % — ABNORMAL HIGH (ref 11.5–15.5)
WBC: 8.8 10*3/uL (ref 4.0–10.5)

## 2015-06-04 LAB — RENAL FUNCTION PANEL
Albumin: 2.7 g/dL — ABNORMAL LOW (ref 3.5–5.0)
Anion gap: 9 (ref 5–15)
BUN: 13 mg/dL (ref 6–20)
CO2: 27 mmol/L (ref 22–32)
Calcium: 8.9 mg/dL (ref 8.9–10.3)
Chloride: 103 mmol/L (ref 101–111)
Creatinine, Ser: 0.42 mg/dL — ABNORMAL LOW (ref 0.44–1.00)
GFR calc Af Amer: >60 mL/min (ref >60)
GFR calc non Af Amer: >60 mL/min (ref >60)
Glucose, Bld: 99 mg/dL (ref 65–99)
Phosphorus: 3.4 mg/dL (ref 2.5–4.6)
Potassium: 3.9 mmol/L (ref 3.5–5.1)
Sodium: 139 mmol/L (ref 135–145)

## 2015-06-04 LAB — GLUCOSE, CAPILLARY
Glucose-Capillary: 120 mg/dL — ABNORMAL HIGH (ref 65–99)
Glucose-Capillary: 117 mg/dL — ABNORMAL HIGH (ref 65–99)
Glucose-Capillary: 110 mg/dL — ABNORMAL HIGH (ref 65–99)

## 2015-06-04 LAB — MAGNESIUM: Magnesium: 2.1 mg/dL (ref 1.7–2.4)

## 2015-06-04 MED ORDER — OXYCODONE HCL 5 MG PO TABS
5.0000 mg | ORAL_TABLET | ORAL | Status: DC | PRN
  Administered 2015-06-05: 5 mg via ORAL
  Filled 2015-06-04: qty 1

## 2015-06-04 NOTE — Evaluation (Signed)
Clinical/Bedside Swallow Evaluation Patient Details  Name: Frances Medina MRN: [id number] Date of Birth: 03-17-1960  Today's Date: 06/04/2015 Time: SLP Start Time (ACUTE ONLY): 1145 SLP Stop Time (ACUTE ONLY): 1207 SLP Time Calculation (min) (ACUTE ONLY): 22 min  Past Medical History:  Past Medical History  Diagnosis Date  . GERD (gastroesophageal reflux disease)   . 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 Knee injury   . Depression   . Seasonal allergies     takes Zytrec  . Fibroid uterus     size of a dime  . Umbilical hernia     watching , no plans for surgery at present  . H/O hiatal hernia   . Hypertension     "went away when I stopped smoking"  . Anxiety   . Migraine     "maybe couple times/month" (03/20/2015)  . Arthritis     "knees" (03/20/2015)  . Osteoarthritis of right knee 08/30/2013  . Osteoarthritis of left knee 12/02/2013  . Myasthenia gravis ([place]) 2017  . E. coli UTI 04/07/2015   Past Surgical History:  Past Surgical History  Procedure Laterality Date  . Cesarean section  1987; 1989  . Dilation and curettage of uterus    . Tubal ligation  1989  . Vaginal hysterectomy  1990's?    "apparently took out one of my ovaries at the time too cause one's missing"  . Partial knee arthroplasty Right 08/30/2013    Procedure: RIGHT UNICOMPARTMENTAL KNEE;  Surgeon: Johnny Bridge, MD;  Location: [facility];  Service: Orthopedics;  Laterality: Right;  . Colonoscopy with propofol N/A 09/19/2013    Procedure: COLONOSCOPY WITH PROPOFOL;  Surgeon: Ladene Artist, MD;  Location: [facility];  Service: Endoscopy;  Laterality: N/A;  . Partial knee arthroplasty Left 12/02/2013    Procedure: LEFT KNEE UNI ARTHROPLASTY;  Surgeon: Johnny Bridge, MD;  Location: [facility];  Service: Orthopedics;  Laterality: Left;  . Esophagogastroduodenoscopy (egd) with propofol N/A 03/21/2015    Procedure: ESOPHAGOGASTRODUODENOSCOPY (EGD) WITH PROPOFOL;  Surgeon: Gatha Mayer, MD;  Location: [facility];  Service: Endoscopy;  Laterality: N/A;   HPI:  Frances Medina is a 55 y.o. female presenting with myasthenia gravis exacerbation. PMH is significant for HTN, uterine fibroids, depression, anxiety, migraines and recently diagnosed myasthenia gravis, and undergoing work-up of splenomegaly with lymphadenopathy.Intubated 3/15, trach placed 3/22. Pt had MBS in prior MG crisis, initially recommended thin liquids due to UES backflow, progressed quickly to solids.    Assessment / Plan / Recommendation Clinical Impression  Pt demonstrates normal oral motor function and a strong ability to cough which are good indicators of recovery from any neuromuscular weakness from MG. When given trials of thin liquids and ice, pt does demonstrate multiple swallows indicative of potential oropharyngeal residual, but could also be attributable to sensation of NG in pharynx. Recommend objective testing tomorrow even if trach is not changed in order to determine if pt is capable of PO diet. FEES recommended due to short, broad stature and probability of poor visibility on MBS.     Aspiration Risk  Moderate aspiration risk    Diet Recommendation NPO        Other  Recommendations Oral Care Recommendations: Oral care QID Other Recommendations: Have oral suction available   Follow up Recommendations  Inpatient Rehab    Frequency and Duration min 2x/week  2 weeks       Prognosis Prognosis for Safe Diet Advancement: Good  Swallow Study   General HPI: Frances Medina is a 55 y.o. female presenting with myasthenia gravis exacerbation. PMH is significant for HTN, uterine fibroids, depression, anxiety, migraines and recently diagnosed myasthenia gravis, and undergoing work-up of splenomegaly with lymphadenopathy.Intubated 3/15, trach placed 3/22. Type of Study: Bedside Swallow Evaluation Previous Swallow Assessment: see HPI Diet Prior to this Study: NPO;NG Tube Temperature Spikes Noted:  No Respiratory Status: Trach;Trach Collar Trach Size and Type: #6;Cuff;Deflated;With PMSV not in place History of Recent Intubation: Yes Length of Intubations (days): 8 days Date extubated: 05/23/15 Behavior/Cognition: Alert;Cooperative;Pleasant mood Oral Cavity Assessment: Within Functional Limits Oral Care Completed by SLP: No Oral Cavity - Dentition: Adequate natural dentition Self-Feeding Abilities: Able to feed self Patient Positioning: Upright in chair Baseline Vocal Quality: Normal Volitional Cough: Strong    Oral/Motor/Sensory Function     Ice Chips Ice chips: Impaired Pharyngeal Phase Impairments: Multiple swallows   Thin Liquid Thin Liquid: Impaired Pharyngeal  Phase Impairments: Multiple swallows    Nectar Thick Nectar Thick Liquid: Not tested   Honey Thick Honey Thick Liquid: Not tested   Puree Puree: Not tested   Solid   GO   Solid: Not tested       Herbie Baltimore, MA CCC-SLP [id number]  Undray Allman, [name] 06/04/2015,2:48 PM

## 2015-06-04 NOTE — Progress Notes (Signed)
Name: Frances Medina MRN: [id number] DOB: March 18, 1960    ADMISSION DATE:  05/15/2015 CONSULTATION DATE: 05/16/2015  REFERRING MD:   CHIEF COMPLAINT: Weakness, dysphagia, SOB in setting of known MG   HISTORY OF PRESENT ILLNESS:  55 y.o. female presenting to ED on 3/14 with increased weakness, SOB, and dysphagia in setting of MG flare. She was intubated and eventually underwent trach. We are following now for trach management    SUBJECTIVE   VITAL SIGNS: BP 115/68 mmHg  Pulse 78  Temp(Src) 98.5 F (36.9 C) (Oral)  Resp 18  Ht 5' (1.524 m)  Wt 250 lb 10.6 oz (113.7 kg)  BMI 48.95 kg/m2  SpO2 98%  HEMODYNAMICS:    VENTILATOR SETTINGS: Vent Mode:  [-]  FiO2 (%):  [21 %-28 %] 28 %  INTAKE / OUTPUT: I/O last 3 completed shifts: In: 2765.2 [I.V.:10; NG/GT:2655.2; IV Piggyback:100] Out: [id number] [Urine:700; Stool:350]  PHYSICAL EXAMINATION: General:  Sitting up in chair. Alert. Awake. Integument:  Warm & dry. No rash on exposed skin.  HEENT:  No scleral injection. Tracheostomy in place.  Cardiovascular:  Regular rate. No edema. No appreciable JVD.  Pulmonary:  Clear bilaterally to auscultation. Normal work of breathing on tracheostomy collar with mild secretions. Abdomen: Soft. Normal bowel sounds. Nondistended.  Neurological:  Nonfocal. CN grossly in tact. Following commands.  LABS:  BMET  Recent Labs Lab 06/01/15 0601 06/02/15 0644 06/03/15 0601  NA 139 140 142  K 3.6 3.4* 3.7  CL 103 103 106  CO2 30 30 29   BUN 14 14 11   CREATININE 0.49 0.48 0.41*  GLUCOSE 103* 119* 116*    Recent Labs Lab 06/02/15 0644 06/03/15 0601 06/04/15 0500  WBC 10.6* 9.0 8.8  HGB 9.6* 9.4* 9.8*  HCT 30.7* 31.8* 32.9*  PLT 309 319 336    Imaging No results found.   STUDIES:  CXR 3/14 >> Hypoinflation of the lungs with mild bibasilar subsegmental atelectasis  Abd X-ray 3/21: Feeding tube with tip in distal duodenum. Port CXR 3/22: Right internal jugular central venous  catheter in good position. Endotracheal tube in good position. Low lung volumes. Minimal basilar atelectasis. Port CXR 3/22:  Trach in place w/ tip approximately 2.5cm above carina. No new opacity. R IJ CVL in place. Bilateral effusions right greater than left.  MICROBIOLOGY: Urine Ctx 3/14:  E coli MRSA Nasal Ctx 3/15:  Negative   ANTIBIOTICS: Bactrim 3/18 - 3/20   SIGNIFICANT EVENTS: 2/10 >> D/C from hospital  3/13 >> Onset of severe SOB and weakness  3/14 >> Present to ED  3/15 >> IVIG and ETT  3/18 >> Unsuccessful extubation , reintubated.  3/21 >> Unsuccessful extubation, reintubated 3/22 >> Trach placed by JY  LINES/TUBES: Shiley #6 Cuffed (Placed by Hyman Bible) 3/22>> R IJ CVL 3/15 >>  L Nare NGT 3/21 - 3/25; 3/25 - 3/31; 3/31>>> OETT 7.0 3/15 - 3/18; 3/18 - 3/21; 3/21 - 3/22  ASSESSMENT / PLAN:   55 y.o. Female s/p acute respiratory failure due to flare of myasthenia gravis. S/P tracheostomy 3/22 by Dr. Nelda Marseille.  Plan Will see again in am 4/4-->plan to change to # 4 cuffless.   Erick Colace ACNP-BC [place] Pager # [phone] OR # [phone] if no answer  06/04/2015, 1:14 PM  Attending Note:  56 year old female with VDRF due to MG exacerbation.  The patient required tracheostomy and was eventually liberated from the ventilator.  On exam, trach site is clean with a  six cuffed trach in place.  Lungs with coarse BS diffusely.  I reviewed CXR myself, trach in good position.  Discussed with PCCM-NP and RT.    VDRF:  - Titrate O2 for sat of 88-92%.  Trach status:  - Maintain cuffed 6 for now.  - Change to cuffless 4 in AM.  Dysphagia:  - SLP in AM post trach change, PCCM will write order.  - Plan to cap over the next couple of days and work towards decannulation if passes swallow evaluation.  PCCM will follow.  Patient seen and examined, agree with above note.  I dictated the care and orders written for this patient under my direction.  Rush Farmer, MD [phone]

## 2015-06-04 NOTE — Progress Notes (Addendum)
Physical Therapy Treatment Patient Details Name: Frances Medina MRN: [id number] DOB: 1960/05/16 Today's Date: 06/04/2015    History of Present Illness pt presents with c/o difficulty swallowing for several days and SOB.  pt found to be in Myasthenia Gravis Flare up with recent diagnosis of Myasthenia Gravis in January and a flare up in February.  pt intubated on 3/15, extubated 3/18 with re-intubation on 3/18 and on vent for Eval. Attempted extubation 3/21 unsuccessfully and pt reintubated.  Trach placed on 3/22. Pt with hx of HTN, Depression, Anxiety, and Migraines.  pt currently undergoing work-up for Splenomegaly with Lymphadenopathy.    PT Comments    Pt performed increased gait distance with cues for pacing and increasing B step length.  Pt fatigue post treatment and refused stair training.  Will f/u for stair training as pt is able to tolerate.   Follow Up Recommendations  Home health PT;Supervision - Intermittent     Equipment Recommendations  None recommended by PT    Recommendations for Other Services       Precautions / Restrictions Precautions Precautions: Fall Precaution Comments: trach collar, NG tube Restrictions Weight Bearing Restrictions: No    Mobility  Bed Mobility Overal bed mobility:  (Pt received in recliner chair.  )             General bed mobility comments: pt in chair  Transfers Overall transfer level: Needs assistance Equipment used: None Transfers: Sit to/from Stand Sit to Stand: Supervision Stand pivot transfers: Supervision       General transfer comment: Pt performed sit to stand from recliner chair and BSC.  x3 transfers total.  Required supervision for lines/leads.    Ambulation/Gait Ambulation/Gait assistance: Supervision (assist for lines/O2) Ambulation Distance (Feet): 420 Feet Assistive device: Rolling walker (2 wheeled) Gait Pattern/deviations: Step-through pattern;Decreased stride length Gait velocity: decreased Gait  velocity interpretation: Below normal speed for age/gender General Gait Details: Safe use of RW.  Patient with 1 standing rest break during gait.  SP 02 98%  on 6L trach attachment collar.     Stairs            Wheelchair Mobility    Modified Rankin (Stroke Patients Only)       Balance     Sitting balance-Leahy Scale: Good       Standing balance-Leahy Scale: Fair                      Cognition Arousal/Alertness: Awake/alert Behavior During Therapy: WFL for tasks assessed/performed Overall Cognitive Status: Within Functional Limits for tasks assessed                      Exercises      General Comments        Pertinent Vitals/Pain Pain Assessment: No/denies pain    Home Living                      Prior Function            PT Goals (current goals can now be found in the care plan section) Acute Rehab PT Goals Patient Stated Goal: Back to normal. Potential to Achieve Goals: Good Progress towards PT goals: Progressing toward goals    Frequency  Min 3X/week    PT Plan Current plan remains appropriate    Co-evaluation             End of Session Equipment Utilized During Treatment: Gait belt;Oxygen Activity  Tolerance: Patient tolerated treatment well Patient left: in chair;with call bell/phone within reach     Time: 1425-1459 PT Time Calculation (min) (ACUTE ONLY): 34 min  Charges:  $Gait Training: 8-22 mins $Therapeutic Activity: 8-22 mins                    G Codes:      Cristela Blue 2015-06-19, 3:14 PM  Governor Rooks, PTA pager [phone]

## 2015-06-04 NOTE — Progress Notes (Signed)
Family Medicine Teaching Service Daily Progress Note Intern Pager: [phone]  Patient name: Frances Medina Medical record number: [id number] Date of birth: 02-23-1961 Age: 55 y.o. Gender: female  Primary Care Provider: Andrena Mews, MD Consultants: Pulmonology, Neurology Code Status: Full  Pt Overview and Major Events to Date:  2/10 >> D/C from hospital  3/13 >> Onset of severe SOB and weakness  3/14 >> Presented to ED  3/15 >> Transferred to Neuro ICU, IVIG and ETT  3/18 >> Unsuccessful extubation , reintubated.  3/19 >> Completed 5 days IVIG 3/21 >> Unsuccessful extubation, reintubated 3/22 >> Trach placed, solu-medrol 3/22  3/27 >> Neurology signed off 3/28 >> Began IV vancomycin and imipenem 3/30 >> Vancomycin discontinued as trach with just oral flora 3/31 >> Lost NG tube this morning; replaced; Trach sutures removed by CCM   Assessment and Plan: Frances Medina is a 55 y.o. female presenting with myasthenia gravis exacerbation. PMH is significant for HTN,uterine fibroids, depression, anxiety, migraines and recently diagnosed myasthenia gravis, and undergoing work-up of splenomegaly with lymphadenopathy.   Myasthenia Gravis Exacerbation: Unclear trigger of symptoms, though discontinuation of steroids is most likely cause. Pt is s/p ICU stay and intubation Follows with Neurologist Dr. Audelia Acton of [facility]. She has tolerated trach collar placed 3/22.   - Neurology signed off- did not recommend further steroids. - Pulmonology following:  Removed sutures on 3/31, however did not downsize trach as they were waiting for further airway maturation; discussed with CCM today and will see patient today, will plan to downsize today or tomorrow  - s/p perc trach on 28% FiO2; appreciate pulmonology recommendations - Tube feedings - Continue home prn albuterol for wheezing/SOB - Drugs to avoid in MG: aminoglycosides, clindamycin, fluoroquinolones, ketolides, vancomycin, beta  blockers, procainamide, quinidine, anti-PD-1 monoclonal antibodies, botox, chloroquine, hydroxychloroquine, magnesium, penicillamine, quinine - PT/OT consulted --> HH PT, intermittent supervision  - Dispo: [facility] vs [facility]   Presumed VAP: Will treat as VAP, given changes in vital signs and increased respiratory secretions s/p intubation. CXR 3/27 worse compared to 3/22 with decreased lung volume, possibly worsened bilateral pleural effusions and opacities concerning for infection vs. atelectasis. White count continues to improve with treatment to 8.8 (4/3). - Continue IV imipenem through 4/4 for a 7d course. - Vanc (3/28 >> 3/30) - Obtain tracheal aspirate --> normal oropharyngeal flora - f/u blood cultures- NGTD  Feeds:  - Currently getting NG feeds - Plan to have repeat swallow study after down-sizing trach; may need MBS per SLP - Ultimately, pt may require PEG if unable to downsize trach   Headache: Headache consistent with congestion. Pt received Fentanyl 175m by RN for this pain. It appears she's been getting fentanyl daily- I suspect for this and other things - has not received Fentanyl 25 mcg for over 24 hours, will d/c this and start Oxycodone 5 mg q4h prn  - Flonase BID for sinus congestion.    Diarrhea: Improving. Suspect from increase in pyridostigmine.  - C. Diff panel resulted as antibody positive, toxin negative, so will not start flagyl  HFpEF: ECHO 04/09/15 performed due to SOB showed EF of 60-65% and G1DD. Patient currently appears euvolemic.  Splenomegaly w/Lymphadenopathy: Being followed by Oncologist Dr. KIrene Limbo Work-up thus far has shown HIV neg, mono screen neg, elevated LDH at 395 and low haptoglobin at <10 (with recent IVIG treatment). To repeat hemolytic markers, CBC and Coombs' test later this month at follow-up. No hypermetabolic masses or lymphadenopathy seen on PET scan 05/14/15.  -  Continue to monitor CBCs  Hx of HTN: stable - Not on any home  anti-hypertensives - Continue to monitor  Depression and Anxiety: - Zoloft 50 mg daily daily - Klonopin 0.5 mg BID  E. Coli UTI: s/p 3 days bactrim  FEN/GI: Tube feeds, pepcid Prophylaxis: Heparin  Disposition: Plan to decrease size of tracheostomy then place PEG tube if needed; anticipate dispo to in-hospital LTAC vs [facility]  Subjective:  Doing well this morning. No complaints. No SOB or abdominal pain. No diarrhea.   Objective: Temp:  [98.4 F (36.9 C)-99.2 F (37.3 C)] 98.5 F (36.9 C) (04/03 0455) Pulse Rate:  [79-112] 94 (04/03 0455) Resp:  [18-20] 20 (04/03 0455) BP: (95-123)/(59-74) 115/68 mmHg (04/03 0455) SpO2:  [94 %-100 %] 97 % (04/03 0455) FiO2 (%):  [21 %-28 %] 28 % (04/03 0455) Weight:  [250 lb 10.6 oz (113.7 kg)] 250 lb 10.6 oz (113.7 kg) (04/03 0455) Physical Exam: General: Obese female, sleeping in recliner, in NAD. Pleasant Cardiovascular: RRR, S1, S2, no m/r/g Chest: CTAB without wheezing, rhonchi, or crackles, trach collar in place and clean.  Abdomen: +BS, soft, NT, ND Extremities: Moves all spontaneously, strength grossly intact   Laboratory:  Recent Labs Lab 06/02/15 0644 06/03/15 0601 06/04/15 0500  WBC 10.6* 9.0 8.8  HGB 9.6* 9.4* 9.8*  HCT 30.7* 31.8* 32.9*  PLT 309 319 336    Recent Labs Lab 06/01/15 0601 06/02/15 0644 06/03/15 0601  NA 139 140 142  K 3.6 3.4* 3.7  CL 103 103 106  CO2 '30 30 29  ' BUN '14 14 11  ' CREATININE 0.49 0.48 0.41*  CALCIUM 8.8* 8.7* 8.8*  GLUCOSE 103* 119* 116*    Imaging/Diagnostic Tests: Dg Chest 1 View  05/28/2015  CLINICAL DATA:  Patient with respiratory failure. Shortness of breath. EXAM: CHEST 1 VIEW COMPARISON:  Chest radiograph 05/23/2015 FINDINGS: Tracheostomy tube terminates in the mid trachea. Enteric tube courses inferior to the diaphragm. Right upper extremity PICC line is present with tip projecting over the right atrium. Low lung volumes. Stable cardiac and mediastinal contours. Small  bilateral pleural effusions. Bibasilar airspace opacities. No pneumothorax. IMPRESSION: Right upper extremity PICC line is present with tip projecting over the right atrium. Low lung volumes, small bilateral pleural effusions and underlying opacities favored to represent atelectasis or infection. Associated interstitial pulmonary edema. Electronically Signed   By: Lovey Newcomer M.D.   On: 05/28/2015 13:06   Dg Chest 2 View  05/15/2015  CLINICAL DATA:  Shortness of breath. EXAM: CHEST  2 VIEW COMPARISON:  April 07, 2015. FINDINGS: The heart size and mediastinal contours are within normal limits. Hypoinflation of the lungs is noted. Mild bibasilar subsegmental atelectasis is noted. No pneumothorax or pleural effusion is noted. The visualized skeletal structures are unremarkable. IMPRESSION: Hypoinflation of the lungs with mild bibasilar subsegmental atelectasis. Electronically Signed   By: Marijo Conception, M.D.   On: 05/15/2015 21:06   Nm Pet Image Initial (pi) Skull Base To Thigh  05/14/2015  CLINICAL DATA:  Initial treatment strategy for lymphadenopathy and splenomegaly. Possibly lymphoproliferative disease. Myasthenia gravis. EXAM: NUCLEAR MEDICINE PET SKULL BASE TO THIGH TECHNIQUE: 14.8 mCi F-18 FDG was injected intravenously. Full-ring PET imaging was performed from the skull base to thigh after the radiotracer. CT data was obtained and used for attenuation correction and anatomic localization. FASTING BLOOD GLUCOSE:  Value: 97 mg/dl COMPARISON:  Chest CTA on 04/07/2015 FINDINGS: NECK No hypermetabolic lymph nodes in the neck. CHEST No hypermetabolic mediastinal or hilar nodes.  No suspicious pulmonary nodules on the CT scan. ABDOMEN/PELVIS No abnormal hypermetabolic activity within the liver, pancreas, adrenal glands, or spleen. No evidence of splenomegaly. No hypermetabolic lymph nodes in the abdomen or pelvis. SKELETON No focal hypermetabolic activity to suggest skeletal metastasis. Mild diffusely  increased hypermetabolic activity seen throughout the axial and appendicular bone marrow. IMPRESSION: No hypermetabolic masses or lymphadenopathy identified. Mild diffusely increased metabolic activity throughout the bone marrow, without focal metastatic lesions. This finding is of uncertain clinical significance, and is likely due to marrow stimulation. Bone marrow biopsy could be considered further evaluation if clinically warranted. Electronically Signed   By: Earle Gell M.D.   On: 05/14/2015 16:33   Dg Chest Port 1 View  05/23/2015  CLINICAL DATA:  Tracheostomy placement. EXAM: PORTABLE CHEST 1 VIEW COMPARISON:  Study obtained earlier in the day FINDINGS: There is now a tracheostomy present with the tracheostomy catheter tip 2.4 cm above the carina. Feeding tube tip is below the diaphragm. Central catheter tip is at the cavoatrial junction. No pneumothorax. There are bilateral pleural effusions, larger on the right than on the left. There is atelectatic change in the bases. Heart is mildly enlarged. The pulmonary vascularity appears normal. No adenopathy. No bone lesions. IMPRESSION: Tube and catheter positions as described without pneumothorax. Mild cardiomegaly with bilateral pleural effusions. Suspect a degree of congestive heart failure. There is bibasilar atelectasis. Electronically Signed   By: Lowella Grip III M.D.   On: 05/23/2015 16:17   Portable Chest Xray  05/23/2015  CLINICAL DATA:  55 year old female female with respiratory failure. Myasthenia gravis. Initial encounter. EXAM: PORTABLE CHEST 1 VIEW COMPARISON:  05/22/2015 and earlier. FINDINGS: Portable AP semi upright view at 0604 hours. Stable endotracheal tube tip at the level the clavicles. Enteric tube now in place, courses to the abdomen and tip not included. Multiple EKG leads and wires also overlie the chest. Stable right IJ central line. Continued low lung volumes with right greater than left basilar veiling opacity. No  pneumothorax. No acute pulmonary edema. Mediastinal contours remain within normal limits. IMPRESSION: 1. Enteric tube placed and courses to the abdomen, tip not included. Otherwise, stable lines and tubes. 2. Stable ventilation. Low lung volumes with bibasilar opacity probably reflecting atelectasis and small effusions. Electronically Signed   By: Genevie Ann M.D.   On: 05/23/2015 07:36   Portable Chest Xray  05/22/2015  CLINICAL DATA:  Re-intubation today EXAM: PORTABLE CHEST 1 VIEW COMPARISON:  05/22/2015 FINDINGS: Unchanged position of endotracheal tube and internal jugular central line. Cardiac enlargement stable. Bibasilar hypoventilatory change with low lung volumes. IMPRESSION: Bilateral lower lobe atelectasis. Findings similar to prior study, with mildly increased opacity at the bases. Electronically Signed   By: Skipper Cliche M.D.   On: 05/22/2015 12:14   Dg Chest Port 1 View  05/22/2015  CLINICAL DATA:  Acute respiratory failure EXAM: PORTABLE CHEST 1 VIEW COMPARISON:  05/21/15 FINDINGS: Cardiomediastinal silhouette is stable. Endotracheal tube in place with tip 2.5 cm above the carina. NG tube in place. Right IJ central line is unchanged in position. Central mild vascular congestion without convincing pulmonary edema persistent mild basilar atelectasis. No segmental infiltrate. IMPRESSION: Stable support apparatus. Central mild vascular congestion without convincing pulmonary edema. Persistent bilateral basilar atelectasis. Electronically Signed   By: Lahoma Crocker M.D.   On: 05/22/2015 07:50   Dg Chest Port 1 View  05/21/2015  CLINICAL DATA:  Acute respiratory failure EXAM: PORTABLE CHEST 1 VIEW COMPARISON:  05/19/2015 FINDINGS: Endotracheal tube terminates 5  cm above the carina. Mild bibasilar opacities, likely atelectasis. No frank interstitial edema. No pleural effusion or pneumothorax. The heart is normal in size. Right IJ venous catheter terminates the cavoatrial junction. Enteric tube courses  into the mid stomach. IMPRESSION: Endotracheal tube terminates 5 cm above the carina. Mild bibasilar opacities, likely atelectasis. No frank interstitial edema. Electronically Signed   By: Julian Hy M.D.   On: 05/21/2015 10:45   Dg Chest Port 1 View  05/19/2015  CLINICAL DATA:  Hypoxia EXAM: PORTABLE CHEST 1 VIEW COMPARISON:  May 19, 2015 study obtained earlier in the day FINDINGS: Endotracheal tube tip is 9 mm above the carina. Central catheter tip is at the cavoatrial junction. Nasogastric tube tip and side port are below the diaphragm in the stomach. No pneumothorax. There is no edema or consolidation. Heart size and pulmonary vascularity are within normal limits. No adenopathy. No bone lesions apparent. IMPRESSION: Tube and catheter positions as described without pneumothorax. Note that the endotracheal tube tip is close to the carina. It may be prudent to withdrawal endotracheal tube approximately 3 cm. No edema or consolidation. These results will be called to the ordering clinician or representative by the Radiologist Assistant, and communication documented in the PACS or zVision Dashboard. Electronically Signed   By: Lowella Grip III M.D.   On: 05/19/2015 11:16   Dg Chest Port 1 View  05/19/2015  CLINICAL DATA:  Acute respiratory failure EXAM: PORTABLE CHEST 1 VIEW COMPARISON:  May 18, 2015 FINDINGS: The ETT is in good position. No other change in support apparatus. No pneumothorax. Bibasilar atelectasis, right greater than left. No other interval changes. IMPRESSION: The ETT is in good position.  No other change. Electronically Signed   By: Dorise Bullion III M.D   On: 05/19/2015 08:11   Dg Chest Port 1 View  05/18/2015  CLINICAL DATA:  Acute respiratory failure.  Endotracheal tube. EXAM: PORTABLE CHEST 1 VIEW COMPARISON:  05/16/2015 FINDINGS: Endotracheal tube terminates approximately 2.5 cm above the carina. Right jugular central venous catheter terminates over the cavoatrial  junction. Enteric tube courses into the left upper abdomen with tip not imaged. Lung volumes remain diminished with persistent mild patchy opacities in both lung bases. Trace bilateral pleural effusions are questioned. No pneumothorax. IMPRESSION: 1. Support devices as above. 2. Low lung volumes with persistent mild bibasilar opacities which may reflect atelectasis. Electronically Signed   By: Logan Bores M.D.   On: 05/18/2015 08:21   Dg Chest Portable 1 View  05/16/2015  CLINICAL DATA:  Status post central line placement EXAM: PORTABLE CHEST 1 VIEW COMPARISON:  05/15/2015 FINDINGS: Cardiac shadow is stable. An endotracheal tube is now seen and lies just above the carina and should be withdrawn 1-2 cm. A nasogastric catheter is seen coiled within the stomach. A right jugular central line is noted at the cavoatrial junction. No pneumothorax is seen. Bilateral pleural effusions are noted with bibasilar patchy opacities. No bony abnormality is seen. IMPRESSION: Endotracheal tube at the level of the carina and should be withdrawn 1-2 cm. No pneumothorax following central line placement. Small bilateral pleural effusions and bibasilar patchy opacities. These results will be called to the ordering clinician or representative by the Radiologist Assistant, and communication documented in the PACS or zVision Dashboard. Electronically Signed   By: Inez Catalina M.D.   On: 05/16/2015 13:35   Dg Abd Portable 1v  06/01/2015  CLINICAL DATA:  Enteric tube placement EXAM: PORTABLE ABDOMEN - 1 VIEW COMPARISON:  05/26/2015 abdominal  radiograph FINDINGS: Weighted enteric tube traverses the stomach and loops in the distal duodenum, with the tip of likely in the proximal jejunum versus fourth portion of the duodenum. No dilated small bowel loops. No evidence of pneumatosis or pneumoperitoneum. IMPRESSION: Weighted enteric tube terminates in a post pyloric position, either in the proximal jejunum or fourth portion of the duodenum.  Electronically Signed   By: Ilona Sorrel M.D.   On: 06/01/2015 14:56   Dg Abd Portable 1v  05/26/2015  CLINICAL DATA:  Encounter for NG tube placement EXAM: PORTABLE ABDOMEN - 1 VIEW COMPARISON:  None. FINDINGS: The feeding tube extends into the stomach. The tip is within the fourth portion duodenum not changed from prior. IMPRESSION: No change in position of feeding tube. Electronically Signed   By: Suzy Bouchard M.D.   On: 05/26/2015 15:31   Dg Abd Portable 1v  05/22/2015  CLINICAL DATA:  Feeding tube placement EXAM: PORTABLE ABDOMEN - 1 VIEW COMPARISON:  05/19/2015 FINDINGS: NG tube is been exchange for a feeding tube. Feeding tube tip is in the distal fourth portion of the duodenum. Normal bowel gas pattern. No significant dilatation or obstruction. No acute osseous finding. IMPRESSION: Feeding tube tip distal duodenum. Electronically Signed   By: Jerilynn Mages.  Shick M.D.   On: 05/22/2015 15:29   Dg Abd Portable 1v  05/19/2015  CLINICAL DATA:  Evaluate OG tube. EXAM: PORTABLE ABDOMEN - 1 VIEW COMPARISON:  May 16, 2015 FINDINGS: The OG tube terminates in the distal stomach. No other acute abnormalities identified. IMPRESSION: Appropriate placement of OG tube. Electronically Signed   By: Dorise Bullion III M.D   On: 05/19/2015 11:17   Dg Abd Portable 1v  05/16/2015  CLINICAL DATA:  OG tube placement EXAM: PORTABLE ABDOMEN - 1 VIEW COMPARISON:  None. FINDINGS: Enteric tube is in place with the tip in the very pyloric region and the side port in the distal stomach. Nonobstructive bowel gas pattern. IMPRESSION: Enteric tube tip in the peripyloric region. Electronically Signed   By: Rolm Baptise M.D.   On: 05/16/2015 13:34    Nicolette Bang, DO 06/04/2015, 8:17 AM PGY-1, [place] Intern pager: [phone], text pages welcome

## 2015-06-04 NOTE — Progress Notes (Signed)
Speech Language Pathology Treatment: Frances Medina Speaking valve  Patient Details Name: Frances Medina MRN: [id number] DOB: 10-14-1960 Today's Date: 06/04/2015 Time: [id number] SLP Time Calculation (min) (ACUTE ONLY): 22 min  Assessment / Plan / Recommendation Clinical Impression  Pt continues to demonstrate inability to redirect air to upper airway past cuffed Shiley #6 with deflated cuff. With cues for hard coughing the pt can mobilize tracheal secretions to upper airway, though PMSV repeatedly blows off. IF SLP cues pt to gently blow air through pursed lips or phonate, no redirection occurs. Despite inability to tolerate PMSV, proceeded with swallow eval as trach change has taken so long in order to avoid further discussion of PEG tube placement, which should not be necessary. See next note. Still hopeful for a downsize to cuffless and maybe even smaller trach in the near future.    HPI HPI: Frances Medina is a 55 y.o. female presenting with myasthenia gravis exacerbation. PMH is significant for HTN, uterine fibroids, depression, anxiety, migraines and recently diagnosed myasthenia gravis, and undergoing work-up of splenomegaly with lymphadenopathy.Intubated 3/15, trach placed 3/22.      SLP Plan  Other (Comment) (FEES tomorrow)     Recommendations         Patient may use Passy-Muir Speech Valve: with SLP only MD: Please consider changing trach tube to : Cuffless      Plan: Other (Comment) (FEES tomorrow)     GO                Lynann Beaver 06/04/2015, 1:47 PM

## 2015-06-04 NOTE — Progress Notes (Signed)
Pharmacy Antibiotic Note  Frances Medina is a 55 y.o. female admitted on 05/15/2015 with   increased weakness, SOB, and dysphagia and noted with  pneumonia and myasthenia gravis  exacerbation.  Pharmacy has been consulted for  primaxin dosing (PCN and cipro allergy noted). -WBC wnl, afeb, and CrCl ~ 90  Plan: -Continue imipenem 500mg  IV q6h through 4/4 -Will follow renal function, cultures and clinical progress   Height: 5' (152.4 cm) Weight: 250 lb 10.6 oz (113.7 kg) IBW/kg (Calculated) : 45.5  Temp (24hrs), Avg:98.7 F (37.1 C), Min:98.4 F (36.9 C), Max:99.2 F (37.3 C)   Recent Labs Lab 05/30/15 0250 05/31/15 0622 06/01/15 0601 06/02/15 0644 06/03/15 0601 06/04/15 0500  WBC 14.1* 11.4* 11.1* 10.6* 9.0 8.8  CREATININE 0.41* 0.40* 0.49 0.48 0.41*  --     Estimated Creatinine Clearance: 92.4 mL/min (by C-G formula based on Cr of 0.41).    Allergies  Allergen Reactions  . Dicyclomine Hives  . Methocarbamol Hives  . Penicillins Hives    Has patient had a PCN reaction causing immediate rash, facial/tongue/throat swelling, SOB or lightheadedness with hypotension: Yes Has patient had a PCN reaction causing severe rash involving mucus membranes or skin necrosis: No Has patient had a PCN reaction that required hospitalization No Has patient had a PCN reaction occurring within the last 10 years: No If all of the above answers are "NO", then may proceed with Cephalosporin use.   . Ciprofloxacin     Rash, shortness of breath  . Shrimp [Shellfish Allergy]     Antimicrobials this admission: Septra 3/18>>3/20 Vanc 3/28>> 3/30 Primaxin 3/28>>  Microbiology results: 3/28 resp- normal flora 3/28 BCx: ngtd 3/14 UCx: ecoli (R to cipro only) 3/15 MRSA PCR: neg C diff anttibody +; toxin neg   Thank you for allowing [place] to participate in this patients care. Jens Som, PharmD Pager: [phone] 06/04/2015 12:25 PM

## 2015-06-05 LAB — RENAL FUNCTION PANEL
Albumin: 2.7 g/dL — ABNORMAL LOW (ref 3.5–5.0)
Anion gap: 9 (ref 5–15)
BUN: 14 mg/dL (ref 6–20)
CO2: 29 mmol/L (ref 22–32)
Calcium: 9 mg/dL (ref 8.9–10.3)
Chloride: 104 mmol/L (ref 101–111)
Creatinine, Ser: 0.4 mg/dL — ABNORMAL LOW (ref 0.44–1.00)
GFR calc Af Amer: >60 mL/min (ref >60)
GFR calc non Af Amer: >60 mL/min (ref >60)
Glucose, Bld: 108 mg/dL — ABNORMAL HIGH (ref 65–99)
Phosphorus: 3.5 mg/dL (ref 2.5–4.6)
Potassium: 3.7 mmol/L (ref 3.5–5.1)
Sodium: 142 mmol/L (ref 135–145)

## 2015-06-05 LAB — CBC WITH DIFFERENTIAL/PLATELET
Basophils Absolute: 0 10*3/uL (ref 0.0–0.1)
Basophils Relative: 0 %
Eosinophils Absolute: 0 10*3/uL (ref 0.0–0.7)
Eosinophils Relative: 0 %
HCT: 32.5 % — ABNORMAL LOW (ref 36.0–46.0)
Hemoglobin: 9.8 g/dL — ABNORMAL LOW (ref 12.0–15.0)
Lymphocytes Relative: 19 %
Lymphs Abs: 1.7 10*3/uL (ref 0.7–4.0)
MCH: 24.8 pg — ABNORMAL LOW (ref 26.0–34.0)
MCHC: 30.2 g/dL (ref 30.0–36.0)
MCV: 82.3 fL (ref 78.0–100.0)
Monocytes Absolute: 0.3 10*3/uL (ref 0.1–1.0)
Monocytes Relative: 3 %
Neutro Abs: 7.3 10*3/uL (ref 1.7–7.7)
Neutrophils Relative %: 78 %
Platelets: 320 10*3/uL (ref 150–400)
RBC: 3.95 MIL/uL (ref 3.87–5.11)
RDW: 18.7 % — ABNORMAL HIGH (ref 11.5–15.5)
WBC: 9.2 10*3/uL (ref 4.0–10.5)

## 2015-06-05 LAB — GLUCOSE, CAPILLARY: Glucose-Capillary: 108 mg/dL — ABNORMAL HIGH (ref 65–99)

## 2015-06-05 LAB — MAGNESIUM: Magnesium: 2.1 mg/dL (ref 1.7–2.4)

## 2015-06-05 MED ORDER — RESOURCE THICKENUP CLEAR PO POWD
ORAL | Status: DC | PRN
  Filled 2015-06-05: qty 125

## 2015-06-05 NOTE — Progress Notes (Signed)
Pt is stable at this time no distress noted. Pt IC was taking out and cleansed with NS and sterile water. Dried and re-inserted and locked into place. No complications noted throughout trach care and assessment. RT will continue to monitor

## 2015-06-05 NOTE — Progress Notes (Signed)
Tube feeding discontinued. NG tube removed. Pt tolerated well. Food tray ordered for pt. Pt tolerating PO's well. Will continue to monitor.    Ruben Reason, RN

## 2015-06-05 NOTE — Progress Notes (Signed)
Pt resting comfortably in bedside chair. No distress noted.

## 2015-06-05 NOTE — Procedures (Signed)
Tracheostomy tube change: Informed verbal consent was obtained after explaining the risks (including bleeding and infection), benefits and alternatives of the procedure. Verbal timeout was performed prior to the procedure. The old  #5 cuffed trach was carefully removed. the tracheostomy site appeared: good. Did have crusty scabbed area from 11 to 2 o'clock on the stoma.. A new #4  Cuffless trach was easily placed in the tracheostomy stoma and secured with velcro trach ties. The tracheostomy was patent, good color change observed via EZ-CAP. She had significant bleeding d/t the cuff size being larger than the stoma. This is common. I have assured her that this should subside over the next 24-48 hrs. Her cough mechanics are excellent.  Erick Colace ACNP-BC [place] Pager # [phone] OR # [phone] if no answer  Rush Farmer, M.D. [facility] Pulmonary/Critical Care Medicine. Pager: [phone]. After hours pager: [phone].

## 2015-06-05 NOTE — Progress Notes (Signed)
Physical Therapy Treatment Patient Details Name: Frances Medina MRN: [id number] DOB: 21-Feb-1961 Today's Date: 06/05/2015    History of Present Illness pt presents with c/o difficulty swallowing for several days and SOB.  pt found to be in Myasthenia Gravis Flare up with recent diagnosis of Myasthenia Gravis in January and a flare up in February.  pt intubated on 3/15, extubated 3/18 with re-intubation on 3/18 and on vent for Eval. Attempted extubation 3/21 unsuccessfully and pt reintubated.  Trach placed on 3/22. Pt with hx of HTN, Depression, Anxiety, and Migraines.  pt currently undergoing work-up for Splenomegaly with Lymphadenopathy.    PT Comments    Miss made excellent progress today, ambulating on RA at supervision level.   SpO2 dropped to 89% very briefly following stair training, otherwise remains in mid 90s on RA. Pt will benefit from continued skilled PT services to increase functional independence and safety.   Follow Up Recommendations  Home health PT;Supervision - Intermittent     Equipment Recommendations  None recommended by PT    Recommendations for Other Services       Precautions / Restrictions Precautions Precautions: Fall Precaution Comments: watch O2 Restrictions Weight Bearing Restrictions: No    Mobility  Bed Mobility               General bed mobility comments: Pt sitting in recliner chair upon PT arrival  Transfers Overall transfer level: Needs assistance Equipment used: Rolling walker (2 wheeled);None Transfers: Sit to/from Stand Sit to Stand: Supervision         General transfer comment: Sit>stand w/o AD, supervision for safety.  Stand>sit w/ RW and supervision for safety.  Poorly controlled descent to recliner chair.  Ambulation/Gait Ambulation/Gait assistance: Supervision Ambulation Distance (Feet): 300 Feet Assistive device: Rolling walker (2 wheeled) Gait Pattern/deviations: Step-through pattern;Decreased stride length   Gait  velocity interpretation: Below normal speed for age/gender General Gait Details: SpO2 drops to 89% very briefly following stair training, otherwise remains in mid 90s on RA.  Pt w/ dec gait speed but otherwise safe technique using RW.   Stairs Stairs: Yes Stairs assistance: Supervision Stair Management: One rail Left;Forwards;Step to pattern Number of Stairs: 4 General stair comments: Supervision for safety.  Pt performs slowly due to fatigue but does not require outside assist.  Wheelchair Mobility    Modified Rankin (Stroke Patients Only)       Balance Overall balance assessment: Needs assistance Sitting-balance support: Feet supported;No upper extremity supported Sitting balance-Leahy Scale: Good     Standing balance support: No upper extremity supported;During functional activity Standing balance-Leahy Scale: Fair Standing balance comment: Pt requires supervision for safety w/ static standing                    Cognition Arousal/Alertness: Awake/alert Behavior During Therapy: WFL for tasks assessed/performed Overall Cognitive Status: Within Functional Limits for tasks assessed                      Exercises      General Comments        Pertinent Vitals/Pain Pain Assessment: No/denies pain Pain Intervention(s): Limited activity within patient's tolerance;Monitored during session    Home Living                      Prior Function            PT Goals (current goals can now be found in the care plan section) Acute Rehab PT Goals  Patient Stated Goal: Back to normal. Progress towards PT goals: Progressing toward goals    Frequency  Min 3X/week    PT Plan Current plan remains appropriate    Co-evaluation             End of Session Equipment Utilized During Treatment: Gait belt Activity Tolerance: Patient limited by fatigue;Patient tolerated treatment well Patient left: in chair;with call bell/phone within reach      Time: 1428-1446 PT Time Calculation (min) (ACUTE ONLY): 18 min  Charges:  $Gait Training: 8-22 mins                    G Codes:      Collie Siad PT, DPT  Pager: [phone] Phone: [phone] 06/05/2015, 3:09 PM

## 2015-06-05 NOTE — Progress Notes (Signed)
Name: Frances Medina MRN: [id number] DOB: 08/21/1960    ADMISSION DATE:  05/15/2015 CONSULTATION DATE: 05/16/2015  REFERRING MD:   CHIEF COMPLAINT: Weakness, dysphagia, SOB in setting of known MG   HISTORY OF PRESENT ILLNESS:  55 y.o. female presenting to ED on 3/14 with increased weakness, SOB, and dysphagia in setting of MG flare. She was intubated and eventually underwent trach. We are following now for trach management    SUBJECTIVE Feels well. Coughing up blood s/p trach change VITAL SIGNS: BP 108/49 mmHg  Pulse 95  Temp(Src) 98.2 F (36.8 C) (Oral)  Resp 20  Ht 5' (1.524 m)  Wt 256 lb 6.3 oz (116.3 kg)  BMI 50.07 kg/m2  SpO2 97%  HEMODYNAMICS:    VENTILATOR SETTINGS: Vent Mode:  [-]  FiO2 (%):  [28 %] 28 %  INTAKE / OUTPUT: I/O last 3 completed shifts: In: 1969.4 [I.V.:10; NG/GT:1759.4; IV Piggyback:200] Out: [id number] [Urine:1325; Stool:450]  PHYSICAL EXAMINATION: General:  Sitting up in chair. Alert. Awake. Integument:  Warm & dry. No rash on exposed skin.  HEENT:  No scleral injection. Tracheostomy in place.  Cardiovascular:  Regular rate. No edema. No appreciable JVD.  Pulmonary: Scattered rhonchi that improve w/ cough.. Normal work of breathing on tracheostomy collar. Coughing up some bloody clots about size of quarter at times/  Abdomen: Soft. Normal bowel sounds. Nondistended.  Neurological:  Nonfocal. CN grossly in tact. Following commands.  LABS:  BMET  Recent Labs Lab 06/03/15 0601 06/04/15 0500 06/05/15 0547  NA 142 139 142  K 3.7 3.9 3.7  CL 106 103 104  CO2 29 27 29   BUN 11 13 14   CREATININE 0.41* 0.42* 0.40*  GLUCOSE 116* 99 108*    Recent Labs Lab 06/03/15 0601 06/04/15 0500 06/05/15 0547  WBC 9.0 8.8 9.2  HGB 9.4* 9.8* 9.8*  HCT 31.8* 32.9* 32.5*  PLT 319 336 320    Imaging No results found.   STUDIES:  CXR 3/14 >> Hypoinflation of the lungs with mild bibasilar subsegmental atelectasis  Abd X-ray 3/21: Feeding  tube with tip in distal duodenum. Port CXR 3/22: Right internal jugular central venous catheter in good position. Endotracheal tube in good position. Low lung volumes. Minimal basilar atelectasis. Port CXR 3/22:  Trach in place w/ tip approximately 2.5cm above carina. No new opacity. R IJ CVL in place. Bilateral effusions right greater than left.  MICROBIOLOGY: Urine Ctx 3/14:  E coli MRSA Nasal Ctx 3/15:  Negative   ANTIBIOTICS: Bactrim 3/18 - 3/20   SIGNIFICANT EVENTS: 2/10 >> D/C from hospital  3/13 >> Onset of severe SOB and weakness  3/14 >> Present to ED  3/15 >> IVIG and ETT  3/18 >> Unsuccessful extubation , reintubated.  3/21 >> Unsuccessful extubation, reintubated 3/22 >> Trach placed by JY  LINES/TUBES: Shiley #6 Cuffed (Placed by JY) 3/22>> changed to #4 cuffless.  R IJ CVL 3/15 >>  L Nare NGT 3/21 - 3/25; 3/25 - 3/31; 3/31>>> OETT 7.0 3/15 - 3/18; 3/18 - 3/21; 3/21 - 3/22  ASSESSMENT / PLAN:   55 y.o. Female s/p acute respiratory failure due to flare of myasthenia gravis. S/P tracheostomy 3/22 by Dr. Nelda Marseille. ->now down sized. Has significant bloody secretions s/p change. This is due to cuff of trach being larger than the stoma. This is to be expected & can continue 24-48 hrs although should slow down an improve over the course of today.  Plan Continue routine trach care. Will need close  obs today re: suctioning and trach care given blood from trach change.  Encourage as much PMV as tolerated; but would still use ATC at night for now.  Will see how she does w/ continued therapy. Eventually will need to do capping trials to determine if she can be safely decannulated.   Erick Colace ACNP-BC [place] Pager # [phone] OR # [phone] if no answer  Attending Note:  55 year old female with VDRF due to MG exacerbation. The patient required tracheostomy and was eventually liberated from the ventilator. On exam, trach site is clean with a six  cuffed trach in place. Lungs with coarse BS diffusely. I reviewed CXR myself, trach in good position. Discussed with PCCM-NP and RT.   VDRF: - Titrate O2 for sat of 88-92%.  Trach status: - PMV as needed.  - Diet per speech pathology. - Cap trach in AM and if well tolerated overnight then will likely decannulate by the end of the week  Dysphagia: - Passed swallow evaluation. - PMV.  PCCM will follow.  Patient seen and examined, agree with above note. I dictated the care and orders written for this patient under my direction.  Rush Farmer, MD [phone]

## 2015-06-05 NOTE — Progress Notes (Signed)
Speech Language Pathology Treatment: Nada Boozer Speaking valve  Patient Details Name: Frances Medina MRN: [id number] DOB: 1960-06-26 Today's Date: 06/05/2015 Time: 1115-1130 SLP Time Calculation (min) (ACUTE ONLY): 15 min  Assessment / Plan / Recommendation Clinical Impression  Skilled treatment session focused on addressing goals for PMSV toleration.  Patient with recent trach down size to Shiley #4 cuffless.  PMSV placed for 30 minutes with vitals remaining WFL and no evidence of air trapping.  Patient able to access upper airway and obtain phonation.  Vocal quality hoarse with decreased vocal intensity due to decreased breath support resulting in 2-3 words per breath. SLP utilized teach back method to facilitate patient donning and doffing PMSV.  Recommend initiation of use of PMSV during all waking hours.    HPI HPI: Frances Medina is a 55 y.o. female presenting with myasthenia gravis exacerbation. PMH is significant for HTN, uterine fibroids, depression, anxiety, migraines and recently diagnosed myasthenia gravis, and undergoing work-up of splenomegaly with lymphadenopathy.Intubated 3/15, trach placed 3/22.      SLP Plan  Continue with current plan of care     Recommendations         Patient may use Passy-Muir Speech Valve: During all waking hours (remove during sleep);During PO intake/meals PMSV Supervision: Intermittent      Oral Care Recommendations: Oral care BID Follow up Recommendations: Inpatient Rehab Plan: Continue with current plan of care     GO              Carmelia Roller., CCC-SLP [id number]   [place] 06/05/2015, 12:47 PM

## 2015-06-05 NOTE — Progress Notes (Signed)
Family Medicine Teaching Service Daily Progress Note Intern Pager: [phone]  Patient name: Frances Medina Medical record number: [id number] Date of birth: [birth year] Age: 55 y.o. Gender: female  Primary Care Provider: Andrena Mews, MD Consultants: Pulmonology, Neurology Code Status: Full  Pt Overview and Major Events to Date:  2/10 >> D/C from hospital  3/13 >> Onset of severe SOB and weakness  3/14 >> Presented to ED  3/15 >> Transferred to Neuro ICU, IVIG and ETT  3/18 >> Unsuccessful extubation , reintubated.  3/19 >> Completed 5 days IVIG 3/21 >> Unsuccessful extubation, reintubated 3/22 >> Trach placed, solu-medrol 3/22  3/27 >> Neurology signed off 3/28 >> Began IV vancomycin and imipenem 3/30 >> Vancomycin discontinued as trach with just oral flora 3/31 >> Lost NG tube this morning; replaced; Trach sutures removed by CCM   Assessment and Plan: Frances Medina is a 55 y.o. female presenting with myasthenia gravis exacerbation. PMH is significant for HTN,uterine fibroids, depression, anxiety, migraines and recently diagnosed myasthenia gravis, and undergoing work-up of splenomegaly with lymphadenopathy.   Myasthenia Gravis Exacerbation: Unclear trigger of symptoms, though discontinuation of steroids is most likely cause. Pt is s/p ICU stay and intubation Follows with Neurologist Dr. Audelia Acton of [facility]. She has tolerated trach collar placed 3/22.   - Pulmonology following: will downsize trach on 4/4  - SLP: will perform FEES today  - Continue home prn albuterol for wheezing/SOB - PT/OT consulted --> HH PT, intermittent supervision  - Dispo: [facility] vs [facility]   Presumed VAP: Will treat as VAP, given changes in vital signs and increased respiratory secretions s/p intubation. CXR 3/27 worse compared to 3/22 with decreased lung volume, possibly worsened bilateral pleural effusions and opacities concerning for infection vs. atelectasis. White count continues to improve  with treatment to 8.8 (4/3). - Continue IV imipenem through 4/4 for a 7d course. - Vanc (3/28 >> 3/30) - f/u blood cultures- NGTD  Feeds:  - Currently getting NG feeds - Plan to have repeat swallow study after down-sizing trach; may need MBS per SLP - Ultimately, pt may require PEG if unable to downsize trach   Headache: Headache consistent with congestion. Pt received Fentanyl 156m by RN for this pain. It appears she's been getting fentanyl daily- I suspect for this and other things -  Oxycodone 5 mg q4h prn  - Flonase BID for sinus congestion.    Diarrhea: Improving. Suspect from increase in pyridostigmine.  - C. Diff panel resulted as antibody positive, toxin negative, so will not start flagyl  HFpEF: ECHO 04/09/15 performed due to SOB showed EF of 60-65% and G1DD. Patient currently appears euvolemic.  Splenomegaly w/Lymphadenopathy: Being followed by Oncologist Dr. KIrene Limbo Work-up thus far has shown HIV neg, mono screen neg, elevated LDH at 395 and low haptoglobin at <10 (with recent IVIG treatment). To repeat hemolytic markers, CBC and Coombs' test later this month at follow-up. No hypermetabolic masses or lymphadenopathy seen on PET scan 05/14/15.  - Continue to monitor CBCs  Hx of HTN: stable - Not on any home anti-hypertensives - Continue to monitor  Depression and Anxiety: - Zoloft 50 mg daily daily - Klonopin 0.5 mg BID  E. Coli UTI: s/p 3 days bactrim  FEN/GI: Tube feeds, pepcid Prophylaxis: Heparin  Disposition: Plan to decrease size of tracheostomy then place PEG tube if needed; anticipate dispo to in-hospital LTAC vs [facility]  Subjective:  Doing well this morning. No complaints.   Objective: Temp:  [98.2 F (36.8 C)-98.6 F (37  C)] 98.2 F (36.8 C) (04/04 0455) Pulse Rate:  [86-120] 95 (04/04 0455) Resp:  [16-20] 20 (04/04 0455) BP: (93-110)/(49-66) 108/49 mmHg (04/04 0455) SpO2:  [96 %-99 %] 97 % (04/04 0819) FiO2 (%):  [28 %] 28 % (04/04 0819) Weight:   [256 lb 6.3 oz (116.3 kg)] 256 lb 6.3 oz (116.3 kg) (04/04 0455) Physical Exam: General: Obese female, sleeping in recliner, in NAD. Pleasant Cardiovascular: RRR, S1, S2, no m/r/g Chest: CTAB without wheezing, rhonchi, or crackles, trach collar in place and clean.  Abdomen: +BS, soft, NT, ND Extremities: Moves all spontaneously, strength grossly intact   Laboratory:  Recent Labs Lab 06/03/15 0601 06/04/15 0500 06/05/15 0547  WBC 9.0 8.8 9.2  HGB 9.4* 9.8* 9.8*  HCT 31.8* 32.9* 32.5*  PLT 319 336 320    Recent Labs Lab 06/03/15 0601 06/04/15 0500 06/05/15 0547  NA 142 139 142  K 3.7 3.9 3.7  CL 106 103 104  CO2 '29 27 29  ' BUN '11 13 14  ' CREATININE 0.41* 0.42* 0.40*  CALCIUM 8.8* 8.9 9.0  GLUCOSE 116* 99 108*    Imaging/Diagnostic Tests: Dg Chest 1 View  05/28/2015  CLINICAL DATA:  Patient with respiratory failure. Shortness of breath. EXAM: CHEST 1 VIEW COMPARISON:  Chest radiograph 05/23/2015 FINDINGS: Tracheostomy tube terminates in the mid trachea. Enteric tube courses inferior to the diaphragm. Right upper extremity PICC line is present with tip projecting over the right atrium. Low lung volumes. Stable cardiac and mediastinal contours. Small bilateral pleural effusions. Bibasilar airspace opacities. No pneumothorax. IMPRESSION: Right upper extremity PICC line is present with tip projecting over the right atrium. Low lung volumes, small bilateral pleural effusions and underlying opacities favored to represent atelectasis or infection. Associated interstitial pulmonary edema. Electronically Signed   By: Lovey Newcomer M.D.   On: 05/28/2015 13:06   Dg Chest 2 View  05/15/2015  CLINICAL DATA:  Shortness of breath. EXAM: CHEST  2 VIEW COMPARISON:  April 07, 2015. FINDINGS: The heart size and mediastinal contours are within normal limits. Hypoinflation of the lungs is noted. Mild bibasilar subsegmental atelectasis is noted. No pneumothorax or pleural effusion is noted. The  visualized skeletal structures are unremarkable. IMPRESSION: Hypoinflation of the lungs with mild bibasilar subsegmental atelectasis. Electronically Signed   By: Marijo Conception, M.D.   On: 05/15/2015 21:06   Nm Pet Image Initial (pi) Skull Base To Thigh  05/14/2015  CLINICAL DATA:  Initial treatment strategy for lymphadenopathy and splenomegaly. Possibly lymphoproliferative disease. Myasthenia gravis. EXAM: NUCLEAR MEDICINE PET SKULL BASE TO THIGH TECHNIQUE: 14.8 mCi F-18 FDG was injected intravenously. Full-ring PET imaging was performed from the skull base to thigh after the radiotracer. CT data was obtained and used for attenuation correction and anatomic localization. FASTING BLOOD GLUCOSE:  Value: 97 mg/dl COMPARISON:  Chest CTA on 04/07/2015 FINDINGS: NECK No hypermetabolic lymph nodes in the neck. CHEST No hypermetabolic mediastinal or hilar nodes. No suspicious pulmonary nodules on the CT scan. ABDOMEN/PELVIS No abnormal hypermetabolic activity within the liver, pancreas, adrenal glands, or spleen. No evidence of splenomegaly. No hypermetabolic lymph nodes in the abdomen or pelvis. SKELETON No focal hypermetabolic activity to suggest skeletal metastasis. Mild diffusely increased hypermetabolic activity seen throughout the axial and appendicular bone marrow. IMPRESSION: No hypermetabolic masses or lymphadenopathy identified. Mild diffusely increased metabolic activity throughout the bone marrow, without focal metastatic lesions. This finding is of uncertain clinical significance, and is likely due to marrow stimulation. Bone marrow biopsy could be considered further evaluation  if clinically warranted. Electronically Signed   By: Earle Gell M.D.   On: 05/14/2015 16:33   Dg Chest Port 1 View  05/23/2015  CLINICAL DATA:  Tracheostomy placement. EXAM: PORTABLE CHEST 1 VIEW COMPARISON:  Study obtained earlier in the day FINDINGS: There is now a tracheostomy present with the tracheostomy catheter tip 2.4 cm  above the carina. Feeding tube tip is below the diaphragm. Central catheter tip is at the cavoatrial junction. No pneumothorax. There are bilateral pleural effusions, larger on the right than on the left. There is atelectatic change in the bases. Heart is mildly enlarged. The pulmonary vascularity appears normal. No adenopathy. No bone lesions. IMPRESSION: Tube and catheter positions as described without pneumothorax. Mild cardiomegaly with bilateral pleural effusions. Suspect a degree of congestive heart failure. There is bibasilar atelectasis. Electronically Signed   By: Lowella Grip III M.D.   On: 05/23/2015 16:17   Portable Chest Xray  05/23/2015  CLINICAL DATA:  55 year old female female with respiratory failure. Myasthenia gravis. Initial encounter. EXAM: PORTABLE CHEST 1 VIEW COMPARISON:  05/22/2015 and earlier. FINDINGS: Portable AP semi upright view at 0604 hours. Stable endotracheal tube tip at the level the clavicles. Enteric tube now in place, courses to the abdomen and tip not included. Multiple EKG leads and wires also overlie the chest. Stable right IJ central line. Continued low lung volumes with right greater than left basilar veiling opacity. No pneumothorax. No acute pulmonary edema. Mediastinal contours remain within normal limits. IMPRESSION: 1. Enteric tube placed and courses to the abdomen, tip not included. Otherwise, stable lines and tubes. 2. Stable ventilation. Low lung volumes with bibasilar opacity probably reflecting atelectasis and small effusions. Electronically Signed   By: Genevie Ann M.D.   On: 05/23/2015 07:36   Portable Chest Xray  05/22/2015  CLINICAL DATA:  Re-intubation today EXAM: PORTABLE CHEST 1 VIEW COMPARISON:  05/22/2015 FINDINGS: Unchanged position of endotracheal tube and internal jugular central line. Cardiac enlargement stable. Bibasilar hypoventilatory change with low lung volumes. IMPRESSION: Bilateral lower lobe atelectasis. Findings similar to prior study,  with mildly increased opacity at the bases. Electronically Signed   By: Skipper Cliche M.D.   On: 05/22/2015 12:14   Dg Chest Port 1 View  05/22/2015  CLINICAL DATA:  Acute respiratory failure EXAM: PORTABLE CHEST 1 VIEW COMPARISON:  05/21/15 FINDINGS: Cardiomediastinal silhouette is stable. Endotracheal tube in place with tip 2.5 cm above the carina. NG tube in place. Right IJ central line is unchanged in position. Central mild vascular congestion without convincing pulmonary edema persistent mild basilar atelectasis. No segmental infiltrate. IMPRESSION: Stable support apparatus. Central mild vascular congestion without convincing pulmonary edema. Persistent bilateral basilar atelectasis. Electronically Signed   By: Lahoma Crocker M.D.   On: 05/22/2015 07:50   Dg Chest Port 1 View  05/21/2015  CLINICAL DATA:  Acute respiratory failure EXAM: PORTABLE CHEST 1 VIEW COMPARISON:  05/19/2015 FINDINGS: Endotracheal tube terminates 5 cm above the carina. Mild bibasilar opacities, likely atelectasis. No frank interstitial edema. No pleural effusion or pneumothorax. The heart is normal in size. Right IJ venous catheter terminates the cavoatrial junction. Enteric tube courses into the mid stomach. IMPRESSION: Endotracheal tube terminates 5 cm above the carina. Mild bibasilar opacities, likely atelectasis. No frank interstitial edema. Electronically Signed   By: Julian Hy M.D.   On: 05/21/2015 10:45   Dg Chest Port 1 View  05/19/2015  CLINICAL DATA:  Hypoxia EXAM: PORTABLE CHEST 1 VIEW COMPARISON:  May 19, 2015 study obtained earlier  in the day FINDINGS: Endotracheal tube tip is 9 mm above the carina. Central catheter tip is at the cavoatrial junction. Nasogastric tube tip and side port are below the diaphragm in the stomach. No pneumothorax. There is no edema or consolidation. Heart size and pulmonary vascularity are within normal limits. No adenopathy. No bone lesions apparent. IMPRESSION: Tube and catheter  positions as described without pneumothorax. Note that the endotracheal tube tip is close to the carina. It may be prudent to withdrawal endotracheal tube approximately 3 cm. No edema or consolidation. These results will be called to the ordering clinician or representative by the Radiologist Assistant, and communication documented in the PACS or zVision Dashboard. Electronically Signed   By: Lowella Grip III M.D.   On: 05/19/2015 11:16   Dg Chest Port 1 View  05/19/2015  CLINICAL DATA:  Acute respiratory failure EXAM: PORTABLE CHEST 1 VIEW COMPARISON:  May 18, 2015 FINDINGS: The ETT is in good position. No other change in support apparatus. No pneumothorax. Bibasilar atelectasis, right greater than left. No other interval changes. IMPRESSION: The ETT is in good position.  No other change. Electronically Signed   By: Dorise Bullion III M.D   On: 05/19/2015 08:11   Dg Chest Port 1 View  05/18/2015  CLINICAL DATA:  Acute respiratory failure.  Endotracheal tube. EXAM: PORTABLE CHEST 1 VIEW COMPARISON:  05/16/2015 FINDINGS: Endotracheal tube terminates approximately 2.5 cm above the carina. Right jugular central venous catheter terminates over the cavoatrial junction. Enteric tube courses into the left upper abdomen with tip not imaged. Lung volumes remain diminished with persistent mild patchy opacities in both lung bases. Trace bilateral pleural effusions are questioned. No pneumothorax. IMPRESSION: 1. Support devices as above. 2. Low lung volumes with persistent mild bibasilar opacities which may reflect atelectasis. Electronically Signed   By: Logan Bores M.D.   On: 05/18/2015 08:21   Dg Chest Portable 1 View  05/16/2015  CLINICAL DATA:  Status post central line placement EXAM: PORTABLE CHEST 1 VIEW COMPARISON:  05/15/2015 FINDINGS: Cardiac shadow is stable. An endotracheal tube is now seen and lies just above the carina and should be withdrawn 1-2 cm. A nasogastric catheter is seen coiled within  the stomach. A right jugular central line is noted at the cavoatrial junction. No pneumothorax is seen. Bilateral pleural effusions are noted with bibasilar patchy opacities. No bony abnormality is seen. IMPRESSION: Endotracheal tube at the level of the carina and should be withdrawn 1-2 cm. No pneumothorax following central line placement. Small bilateral pleural effusions and bibasilar patchy opacities. These results will be called to the ordering clinician or representative by the Radiologist Assistant, and communication documented in the PACS or zVision Dashboard. Electronically Signed   By: Inez Catalina M.D.   On: 05/16/2015 13:35   Dg Abd Portable 1v  06/01/2015  CLINICAL DATA:  Enteric tube placement EXAM: PORTABLE ABDOMEN - 1 VIEW COMPARISON:  05/26/2015 abdominal radiograph FINDINGS: Weighted enteric tube traverses the stomach and loops in the distal duodenum, with the tip of likely in the proximal jejunum versus fourth portion of the duodenum. No dilated small bowel loops. No evidence of pneumatosis or pneumoperitoneum. IMPRESSION: Weighted enteric tube terminates in a post pyloric position, either in the proximal jejunum or fourth portion of the duodenum. Electronically Signed   By: Ilona Sorrel M.D.   On: 06/01/2015 14:56   Dg Abd Portable 1v  05/26/2015  CLINICAL DATA:  Encounter for NG tube placement EXAM: PORTABLE ABDOMEN - 1 VIEW  COMPARISON:  None. FINDINGS: The feeding tube extends into the stomach. The tip is within the fourth portion duodenum not changed from prior. IMPRESSION: No change in position of feeding tube. Electronically Signed   By: Suzy Bouchard M.D.   On: 05/26/2015 15:31   Dg Abd Portable 1v  05/22/2015  CLINICAL DATA:  Feeding tube placement EXAM: PORTABLE ABDOMEN - 1 VIEW COMPARISON:  05/19/2015 FINDINGS: NG tube is been exchange for a feeding tube. Feeding tube tip is in the distal fourth portion of the duodenum. Normal bowel gas pattern. No significant dilatation or  obstruction. No acute osseous finding. IMPRESSION: Feeding tube tip distal duodenum. Electronically Signed   By: Jerilynn Mages.  Shick M.D.   On: 05/22/2015 15:29   Dg Abd Portable 1v  05/19/2015  CLINICAL DATA:  Evaluate OG tube. EXAM: PORTABLE ABDOMEN - 1 VIEW COMPARISON:  May 16, 2015 FINDINGS: The OG tube terminates in the distal stomach. No other acute abnormalities identified. IMPRESSION: Appropriate placement of OG tube. Electronically Signed   By: Dorise Bullion III M.D   On: 05/19/2015 11:17   Dg Abd Portable 1v  05/16/2015  CLINICAL DATA:  OG tube placement EXAM: PORTABLE ABDOMEN - 1 VIEW COMPARISON:  None. FINDINGS: Enteric tube is in place with the tip in the very pyloric region and the side port in the distal stomach. Nonobstructive bowel gas pattern. IMPRESSION: Enteric tube tip in the peripyloric region. Electronically Signed   By: Rolm Baptise M.D.   On: 05/16/2015 13:34    Nicolette Bang, DO 06/05/2015, 9:30 AM PGY-1, [facility] Intern pager: [phone], text pages welcome

## 2015-06-05 NOTE — Procedures (Signed)
Objective Swallowing Evaluation: Type of Study: FEES  Patient Details  Name: YANNA MOUTON MRN: [id number] Date of Birth: 23-Sep-1960  Today's Date: 06/05/2015 Time: SLP Start Time (ACUTE ONLY): 1130-SLP Stop Time (ACUTE ONLY): 1145 SLP Time Calculation (min) (ACUTE ONLY): 15 min  Past Medical History:  Past Medical History  Diagnosis Date  . GERD (gastroesophageal reflux disease)   . 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 Knee injury   . Depression   . Seasonal allergies     takes Zytrec  . Fibroid uterus     size of a dime  . Umbilical hernia     watching , no plans for surgery at present  . H/O hiatal hernia   . Hypertension     "went away when I stopped smoking"  . Anxiety   . Migraine     "maybe couple times/month" (03/20/2015)  . Arthritis     "knees" (03/20/2015)  . Osteoarthritis of right knee 08/30/2013  . Osteoarthritis of left knee 12/02/2013  . Myasthenia gravis ([place]) 2017  . E. coli UTI 04/07/2015   Past Surgical History:  Past Surgical History  Procedure Laterality Date  . Cesarean section  1987; 1989  . Dilation and curettage of uterus    . Tubal ligation  1989  . Vaginal hysterectomy  1990's?    "apparently took out one of my ovaries at the time too cause one's missing"  . Partial knee arthroplasty Right 08/30/2013    Procedure: RIGHT UNICOMPARTMENTAL KNEE;  Surgeon: Johnny Bridge, MD;  Location: [place];  Service: Orthopedics;  Laterality: Right;  . Colonoscopy with propofol N/A 09/19/2013    Procedure: COLONOSCOPY WITH PROPOFOL;  Surgeon: Ladene Artist, MD;  Location: [facility];  Service: Endoscopy;  Laterality: N/A;  . Partial knee arthroplasty Left 12/02/2013    Procedure: LEFT KNEE UNI ARTHROPLASTY;  Surgeon: Johnny Bridge, MD;  Location: [place];  Service: Orthopedics;  Laterality: Left;  . Esophagogastroduodenoscopy (egd) with propofol N/A 03/21/2015    Procedure: ESOPHAGOGASTRODUODENOSCOPY (EGD) WITH PROPOFOL;  Surgeon: Gatha Mayer, MD;   Location: [place];  Service: Endoscopy;  Laterality: N/A;   HPI: DALIYA SUMI is a 55 y.o. female presenting with myasthenia gravis exacerbation. PMH is significant for HTN, uterine fibroids, depression, anxiety, migraines and recently diagnosed myasthenia gravis, and undergoing work-up of splenomegaly with lymphadenopathy.Intubated 3/15, trach placed 3/22.  Subjective: patient up in chair wake and alert  Assessment / Plan / Recommendation  CHL IP CLINICAL IMPRESSIONS 06/05/2015  Therapy Diagnosis Moderate pharyngeal phase dysphagia  Clinical Impression Patient demonstrates a moderate oropharyngeal dysphagia with premature spillage and delayed swallow initiation across tested consistencies. Trials of ice chips result in silent aspiration and thin trials resulted in loss of bolus over the valleculae with likely penetration/aspiration causing the patient to throat clear.  However, SLP unable to view given that this occurred at the height of the swallow.  Slightly decreased glottis closure observed, likely a result of intubation.  Nectar-thick liquid trials resulted in improved containment of bolus with minimal spillage from valleculae into the lateral channels and no observed penetration or aspiration.  Puree and soft solid consistencies yielded scattered pharyngeal residue that cleared with multiple swallows, throat clears and repeat swallows.  Recommend initiation of Dys. 2 textures due to less residue post swallow as well as for energy conservation and nectar-thick liquids via cup.  SLP will follow acutely and anticipate that patient will require post acute follow-up  as well.    Impact on safety and function Moderate aspiration risk      CHL IP TREATMENT RECOMMENDATION 06/05/2015  Treatment Recommendations Therapy as outlined in treatment plan below     Prognosis 06/05/2015  Prognosis for Safe Diet Advancement Good  Barriers to Reach Goals --  Barriers/Prognosis Comment --    CHL IP DIET  RECOMMENDATION 06/05/2015  SLP Diet Recommendations Dysphagia 2 (Fine chop) solids;Nectar thick liquid  Liquid Administration via Cup;No straw  Medication Administration Crushed with puree  Compensations Slow rate;Small sips/bites;Multiple dry swallows after each bite/sip;Clear throat intermittently  Postural Changes Seated upright at 90 degrees;Remain semi-upright after after feeds/meals (Comment)      CHL IP OTHER RECOMMENDATIONS 06/05/2015  Recommended Consults --  Oral Care Recommendations Oral care BID  Other Recommendations Have oral suction available;Remove water pitcher;Prohibited food (jello, ice cream, thin soups);Order thickener from pharmacy;Place PMSV during PO intake      CHL IP FOLLOW UP RECOMMENDATIONS 06/05/2015  Follow up Recommendations Inpatient Rehab      CHL IP FREQUENCY AND DURATION 06/05/2015  Speech Therapy Frequency (ACUTE ONLY) min 2x/week  Treatment Duration 1 week           CHL IP ORAL PHASE 06/05/2015  Oral Phase WFL  Oral - Pudding Teaspoon --  Oral - Pudding Cup --  Oral - Honey Teaspoon --  Oral - Honey Cup --  Oral - Nectar Teaspoon --  Oral - Nectar Cup --  Oral - Nectar Straw --  Oral - Thin Teaspoon --  Oral - Thin Cup --  Oral - Thin Straw --  Oral - Puree --  Oral - Mech Soft --  Oral - Regular --  Oral - Multi-Consistency --  Oral - Pill --  Oral Phase - Comment --    CHL IP PHARYNGEAL PHASE 06/05/2015  Pharyngeal Phase Impaired  Pharyngeal- Pudding Teaspoon --  Pharyngeal --  Pharyngeal- Pudding Cup --  Pharyngeal --  Pharyngeal- Honey Teaspoon --  Pharyngeal --  Pharyngeal- Honey Cup --  Pharyngeal --  Pharyngeal- Nectar Teaspoon Delayed swallow initiation-vallecula  Pharyngeal --  Pharyngeal- Nectar Cup Delayed swallow initiation-vallecula;Other (Comment)  Pharyngeal --  Pharyngeal- Nectar Straw --  Pharyngeal --  Pharyngeal- Thin Teaspoon Delayed swallow initiation-vallecula;Other (Comment)  Pharyngeal --  Pharyngeal- Thin  Cup Delayed swallow initiation-pyriform sinuses;Other (Comment);Penetration/Aspiration during swallow  Pharyngeal Other (Comment)  Pharyngeal- Thin Straw --  Pharyngeal --  Pharyngeal- Puree Delayed swallow initiation-vallecula;Pharyngeal residue - valleculae;Other (Comment)  Pharyngeal --  Pharyngeal- Mechanical Soft Delayed swallow initiation-vallecula;Other (Comment);Pharyngeal residue - valleculae  Pharyngeal --  Pharyngeal- Regular --  Pharyngeal --  Pharyngeal- Multi-consistency --  Pharyngeal --  Pharyngeal- Pill --  Pharyngeal --  Pharyngeal Comment ice chip resulted in aspiration with no sensation of material in airway     CHL IP CERVICAL ESOPHAGEAL PHASE 06/05/2015  Cervical Esophageal Phase (No Data)  Pudding Teaspoon --  Pudding Cup --  Honey Teaspoon --  Honey Cup --  Nectar Teaspoon --  Nectar Cup --  Nectar Straw --  Thin Teaspoon --  Thin Cup --  Thin Straw --  Puree --  Mechanical Soft --  Regular --  Multi-consistency --  Pill --  Cervical Esophageal Comment --    CHL IP GO 03/26/2015  Functional Assessment Tool Used    Functional Limitations (None)  Swallow Current Status [id number]) (None)  Swallow Goal Status [id number]) (None)  Swallow Discharge Status [id number]) (None)  Motor Speech Current Status [id number]) (None)  Motor Speech Goal Status [id number]) (None)  Motor Speech Goal Status [phone]) (None)  Spoken Language Comprehension Current Status [phone]) (None)  Spoken Language Comprehension Goal Status [id number]) (None)  Spoken Language Comprehension Discharge Status [phone]) (None)  Spoken Language Expression Current Status [phone]) (None)  Spoken Language Expression Goal Status [id number]) (None)  Spoken Language Expression Discharge Status [phone]) (None)  Attention Current Status [id number]) (None)  Attention Goal Status [id number]) (None)  Attention Discharge Status [phone]) (None)  Memory Current Status [id number]) (None)  Memory Goal Status [id number]) (None)  Memory Discharge  Status [id number]) (None)  Voice Current Status [id number]) (None)  Voice Goal Status [id number]) (None)  Voice Discharge Status [id number]) (None)  Other Speech-Language Pathology Functional Limitation [phone]) (None)  Other Speech-Language Pathology Functional Limitation Goal Status [id number]) (None)  Other Speech-Language Pathology Functional Limitation Discharge Status [phone]) (None)   Gunnar Fusi, M.A., CCC-SLP [phone]   [place] 06/05/2015, 1:16 PM

## 2015-06-06 LAB — RENAL FUNCTION PANEL
Albumin: 2.9 g/dL — ABNORMAL LOW (ref 3.5–5.0)
Anion gap: 10 (ref 5–15)
BUN: 9 mg/dL (ref 6–20)
CO2: 29 mmol/L (ref 22–32)
Calcium: 9.2 mg/dL (ref 8.9–10.3)
Chloride: 102 mmol/L (ref 101–111)
Creatinine, Ser: 0.45 mg/dL (ref 0.44–1.00)
GFR calc Af Amer: >60 mL/min (ref >60)
GFR calc non Af Amer: >60 mL/min (ref >60)
Glucose, Bld: 87 mg/dL (ref 65–99)
Phosphorus: 3.3 mg/dL (ref 2.5–4.6)
Potassium: 3.8 mmol/L (ref 3.5–5.1)
Sodium: 141 mmol/L (ref 135–145)

## 2015-06-06 LAB — CBC WITH DIFFERENTIAL/PLATELET
Basophils Absolute: 0 10*3/uL (ref 0.0–0.1)
Basophils Relative: 0 %
Eosinophils Absolute: 0 10*3/uL (ref 0.0–0.7)
Eosinophils Relative: 0 %
HCT: 32.7 % — ABNORMAL LOW (ref 36.0–46.0)
Hemoglobin: 10.1 g/dL — ABNORMAL LOW (ref 12.0–15.0)
Lymphocytes Relative: 20 %
Lymphs Abs: 1.8 10*3/uL (ref 0.7–4.0)
MCH: 25.6 pg — ABNORMAL LOW (ref 26.0–34.0)
MCHC: 30.9 g/dL (ref 30.0–36.0)
MCV: 82.8 fL (ref 78.0–100.0)
Monocytes Absolute: 0.4 10*3/uL (ref 0.1–1.0)
Monocytes Relative: 4 %
Neutro Abs: 7.2 10*3/uL (ref 1.7–7.7)
Neutrophils Relative %: 76 %
Platelets: 334 10*3/uL (ref 150–400)
RBC: 3.95 MIL/uL (ref 3.87–5.11)
RDW: 18.8 % — ABNORMAL HIGH (ref 11.5–15.5)
WBC: 9.4 10*3/uL (ref 4.0–10.5)

## 2015-06-06 LAB — MAGNESIUM: Magnesium: 2.1 mg/dL (ref 1.7–2.4)

## 2015-06-06 MED ORDER — BOOST PLUS PO LIQD
237.0000 mL | Freq: Three times a day (TID) | ORAL | Status: DC
  Administered 2015-06-06 – 2015-06-07 (×2): 237 mL via ORAL
  Filled 2015-06-06 (×10): qty 237

## 2015-06-06 MED ORDER — FAMOTIDINE 20 MG PO TABS
20.0000 mg | ORAL_TABLET | Freq: Two times a day (BID) | ORAL | Status: DC
  Administered 2015-06-06 – 2015-06-07 (×4): 20 mg via ORAL
  Filled 2015-06-06 (×5): qty 1

## 2015-06-06 MED ORDER — SERTRALINE HCL 50 MG PO TABS
50.0000 mg | ORAL_TABLET | Freq: Every day | ORAL | Status: DC
  Administered 2015-06-06 – 2015-06-08 (×3): 50 mg via ORAL
  Filled 2015-06-06 (×3): qty 1

## 2015-06-06 NOTE — Clinical Social Work Note (Signed)
CSW continues to follow patient for any discharge needs. At this time the patient may be able to return home at discharge. If the patient needs to discharge to SNF, a bed remains available at [facility]. Handoff provided for next CSW.  Liz Beach MSW, [place], [place], [phone]

## 2015-06-06 NOTE — Progress Notes (Signed)
Speech Language Pathology Treatment: Dysphagia  Patient Details Name: Frances Medina MRN: [id number] DOB: 06/21/1960 Today's Date: 06/06/2015 Time: [id number] SLP Time Calculation (min) (ACUTE ONLY): 13 min  Assessment / Plan / Recommendation Clinical Impression  Pt had her trach capped upon SLP arrival with good volume, although still with hoarse vocal quality. She was eager to try thin liquids, which she consumed with intermittent delayed cough. Pt attempted 3 ounce water test due to risk for silent aspiration, which she was unable to complete due to immediate coughing after first initial sips. Continue to recommend current diet with nectar thick liquids. Pt thickened the rest of her thickened liquids with Min cues from SLP.   HPI HPI: Frances Medina is a 55 y.o. female presenting with myasthenia gravis exacerbation. PMH is significant for HTN, uterine fibroids, depression, anxiety, migraines and recently diagnosed myasthenia gravis, and undergoing work-up of splenomegaly with lymphadenopathy.Intubated 3/15, trach placed 3/22.      SLP Plan  Continue with current plan of care     Recommendations  Diet recommendations: Dysphagia 2 (fine chop);Nectar-thick liquid Liquids provided via: Cup;No straw Medication Administration: Whole meds with puree Supervision: Patient able to self feed;Intermittent supervision to cue for compensatory strategies Compensations: Slow rate;Small sips/bites;Multiple dry swallows after each bite/sip;Clear throat intermittently Postural Changes and/or Swallow Maneuvers: Seated upright 90 degrees;Upright 30-60 min after meal             Oral Care Recommendations: Oral care BID Follow up Recommendations: Home health SLP Plan: Continue with current plan of care     GO               Frances Medina, M.A. CCC-SLP [phone]  Frances Medina 06/06/2015, 2:02 PM

## 2015-06-06 NOTE — Progress Notes (Signed)
Pt is sitting comfortably resting in chair watching the news no distress noted.

## 2015-06-06 NOTE — Progress Notes (Signed)
Nutrition Follow-up  DOCUMENTATION CODES:   Morbid obesity  INTERVENTION:  -Boost Plus TID, each supplement provides 350 calories and 14g protein -Per SLP -> NDD2 Nectar Thick Liquids   NUTRITION DIAGNOSIS:   Inadequate oral intake related to inability to eat as evidenced by NPO status.  discontinued  GOAL:   Patient will meet greater than or equal to 90% of their needs  progressing  MONITOR:   Labs, TF tolerance, Diet advancement, I & O's  ASSESSMENT:   Pt presenting with myasthenia gravis exacerbation. PMH is significant for HTN,uterine fibroids, depression, anxiety, migraines and recently diagnosed myasthenia gravis, and undergoing work-up of splenomegaly with lymphadenopathy.  Pt was to undergo placement of Cortrak tube, has since progressed. Has trach, currently undergoing capping trails.  Consuming 10-25% of meals per chart. Per patient her appetite is ok. She also feels she is getting a lot of food on the trays.  She did ask for Boost for when she doesn't feel like eating.  She is in great spirits, looks much better.  Labs and Medications reviewed.  Diet Order:  DIET DYS 2 Room service appropriate?: Yes; Fluid consistency:: Nectar Thick  Skin:  Reviewed, no issues  Last BM:  3/31  Height:   Ht Readings from Last 1 Encounters:  05/16/15 5' (1.524 m)    Weight:   Wt Readings from Last 1 Encounters:  06/06/15 257 lb 1.6 oz (116.62 kg)    Ideal Body Weight:  45.4 kg  BMI:  Body mass index is 50.21 kg/(m^2).  Estimated Nutritional Needs:   Kcal:  1700-2000  Protein:  115 gm  Fluid:  1.7-2 L  EDUCATION NEEDS:   No education needs identified at this time  Frances Medina. Frances Bordelon, MS, RD LDN After Hours/Weekend Pager [phone]

## 2015-06-06 NOTE — Progress Notes (Signed)
Family Medicine Teaching Service Daily Progress Note Intern Pager: [phone]  Patient name: Frances Medina Medical record number: [id number] Date of birth: 1961-02-25 Age: 55 y.o. Gender: female  Primary Care Provider: Andrena Mews, MD Consultants: Pulmonology, Neurology Code Status: Full  Pt Overview and Major Events to Date:  2/10 >> D/C from hospital  3/13 >> Onset of severe SOB and weakness  3/14 >> Presented to ED  3/15 >> Transferred to Neuro ICU, IVIG and ETT  3/18 >> Unsuccessful extubation , reintubated.  3/19 >> Completed 5 days IVIG 3/21 >> Unsuccessful extubation, reintubated 3/22 >> Trach placed, solu-medrol 3/22  3/27 >> Neurology signed off 3/28 >> Began IV vancomycin and imipenem 3/30 >> Vancomycin discontinued as trach with just oral flora 3/31 >> Lost NG tube this morning; replaced; Trach sutures removed by CCM  4/4 >> Trach successfully downsized   Assessment and Plan: Frances Medina is a 55 y.o. female presenting with myasthenia gravis exacerbation. PMH is significant for HTN,uterine fibroids, depression, anxiety, migraines and recently diagnosed myasthenia gravis, and undergoing work-up of splenomegaly with lymphadenopathy.   Myasthenia Gravis Exacerbation: Unclear trigger of symptoms, though discontinuation of steroids is most likely cause. Pt is s/p ICU stay and intubation Follows with Neurologist Dr. Audelia Acton of [facility]. She has tolerated trach collar placed 3/22.   - Pulmonology following: trach downsized on 4/4, plan to possibly decannulate in the next few days  - SLP: dysphagia II diet ordered  - Continue home prn albuterol for wheezing/SOB - PT/OT consulted --> HH PT, intermittent supervision  - Dispo: ? [facility]   Presumed VAP, Treated: Received Vancomycin 3/28 >> 3/30. IV Imipenem ended on 4/4 to complete 7 day course of treatment. Blood cultures NG.  -continue to monitor respiratory status    Headache: Headache consistent with  congestion.  -  Oxycodone 5 mg q4h prn  - Flonase BID for sinus congestion.    Diarrhea: Improving. Suspect from increase in pyridostigmine.  - C. Diff panel resulted as antibody positive, toxin negative, so will not start flagyl  HFpEF: ECHO 04/09/15 performed due to SOB showed EF of 60-65% and G1DD. Patient currently appears euvolemic.  Splenomegaly w/Lymphadenopathy: Being followed by Oncologist Dr. Irene Limbo. Work-up thus far has shown HIV neg, mono screen neg, elevated LDH at 395 and low haptoglobin at <10 (with recent IVIG treatment). To repeat hemolytic markers, CBC and Coombs' test later this month at follow-up. No hypermetabolic masses or lymphadenopathy seen on PET scan 05/14/15.  - Continue to monitor CBCs  Hx of HTN: stable - Not on any home anti-hypertensives - Continue to monitor  Depression and Anxiety: - Zoloft 50 mg daily daily - Klonopin 0.5 mg BID  E. Coli UTI: s/p 3 days bactrim  FEN/GI: Dysphagia II Diet, pepcid Prophylaxis: Heparin  Disposition: Pending CCM recs regarding trach  Subjective:  In very good spirits this AM. No complaints. No difficulties with breathing. Has been able to tolerate eating small amounts.   Objective: Temp:  [98.3 F (36.8 C)-98.6 F (37 C)] 98.6 F (37 C) (04/05 0440) Pulse Rate:  [83-110] 101 (04/05 0818) Resp:  [16-18] 18 (04/05 0818) BP: (110-112)/(60-65) 111/64 mmHg (04/05 0440) SpO2:  [92 %-97 %] 92 % (04/05 0818) FiO2 (%):  [21 %-28 %] 21 % (04/05 0818) Weight:  [257 lb 1.6 oz (116.62 kg)] 257 lb 1.6 oz (116.62 kg) (04/05 0225) Physical Exam: General: Obese female, sleeping in recliner, in NAD. Pleasant Cardiovascular: RRR, S1, S2, no m/r/g Chest: CTAB without  wheezing, rhonchi, or crackles, trach in place and clean, with minimal amounts of dried blood.  Abdomen: +BS, soft, NT, ND Extremities: Moves all spontaneously, strength grossly intact   Laboratory:  Recent Labs Lab 06/04/15 0500 06/05/15 0547 06/06/15 0607   WBC 8.8 9.2 9.4  HGB 9.8* 9.8* 10.1*  HCT 32.9* 32.5* 32.7*  PLT 336 320 334    Recent Labs Lab 06/04/15 0500 06/05/15 0547 06/06/15 0607  NA 139 142 141  K 3.9 3.7 3.8  CL 103 104 102  CO2 _0 BUN _1 CREATININE 0.42* 0.40* 0.45  CALCIUM 8.9 9.0 9.2  GLUCOSE 99 108* 87    Imaging/Diagnostic Tests: Dg Chest 1 View  05/28/2015  CLINICAL DATA:  Patient with respiratory failure. Shortness of breath. EXAM: CHEST 1 VIEW COMPARISON:  Chest radiograph 05/23/2015 FINDINGS: Tracheostomy tube terminates in the mid trachea. Enteric tube courses inferior to the diaphragm. Right upper extremity PICC line is present with tip projecting over the right atrium. Low lung volumes. Stable cardiac and mediastinal contours. Small bilateral pleural effusions. Bibasilar airspace opacities. No pneumothorax. IMPRESSION: Right upper extremity PICC line is present with tip projecting over the right atrium. Low lung volumes, small bilateral pleural effusions and underlying opacities favored to represent atelectasis or infection. Associated interstitial pulmonary edema. Electronically Signed   By: Lovey Newcomer M.D.   On: 05/28/2015 13:06   Dg Chest 2 View  05/15/2015  CLINICAL DATA:  Shortness of breath. EXAM: CHEST  2 VIEW COMPARISON:  April 07, 2015. FINDINGS: The heart size and mediastinal contours are within normal limits. Hypoinflation of the lungs is noted. Mild bibasilar subsegmental atelectasis is noted. No pneumothorax or pleural effusion is noted. The visualized skeletal structures are unremarkable. IMPRESSION: Hypoinflation of the lungs with mild bibasilar subsegmental atelectasis. Electronically Signed   By: Marijo Conception, M.D.   On: 05/15/2015 21:06   Nm Pet Image Initial (pi) Skull Base To Thigh  05/14/2015  CLINICAL DATA:  Initial treatment strategy for lymphadenopathy and splenomegaly. Possibly lymphoproliferative disease. Myasthenia gravis. EXAM: NUCLEAR MEDICINE PET SKULL BASE TO  THIGH TECHNIQUE: 14.8 mCi F-18 FDG was injected intravenously. Full-ring PET imaging was performed from the skull base to thigh after the radiotracer. CT data was obtained and used for attenuation correction and anatomic localization. FASTING BLOOD GLUCOSE:  Value: 97 mg/dl COMPARISON:  Chest CTA on 04/07/2015 FINDINGS: NECK No hypermetabolic lymph nodes in the neck. CHEST No hypermetabolic mediastinal or hilar nodes. No suspicious pulmonary nodules on the CT scan. ABDOMEN/PELVIS No abnormal hypermetabolic activity within the liver, pancreas, adrenal glands, or spleen. No evidence of splenomegaly. No hypermetabolic lymph nodes in the abdomen or pelvis. SKELETON No focal hypermetabolic activity to suggest skeletal metastasis. Mild diffusely increased hypermetabolic activity seen throughout the axial and appendicular bone marrow. IMPRESSION: No hypermetabolic masses or lymphadenopathy identified. Mild diffusely increased metabolic activity throughout the bone marrow, without focal metastatic lesions. This finding is of uncertain clinical significance, and is likely due to marrow stimulation. Bone marrow biopsy could be considered further evaluation if clinically warranted. Electronically Signed   By: Earle Gell M.D.   On: 05/14/2015 16:33   Dg Chest Port 1 View  05/23/2015  CLINICAL DATA:  Tracheostomy placement. EXAM: PORTABLE CHEST 1 VIEW COMPARISON:  Study obtained earlier in the day FINDINGS: There is now a tracheostomy present with the tracheostomy catheter tip 2.4 cm above the carina. Feeding tube tip is below the diaphragm. Central catheter tip is at the  cavoatrial junction. No pneumothorax. There are bilateral pleural effusions, larger on the right than on the left. There is atelectatic change in the bases. Heart is mildly enlarged. The pulmonary vascularity appears normal. No adenopathy. No bone lesions. IMPRESSION: Tube and catheter positions as described without pneumothorax. Mild cardiomegaly with  bilateral pleural effusions. Suspect a degree of congestive heart failure. There is bibasilar atelectasis. Electronically Signed   By: Lowella Grip III M.D.   On: 05/23/2015 16:17   Portable Chest Xray  05/23/2015  CLINICAL DATA:  55 year old female female with respiratory failure. Myasthenia gravis. Initial encounter. EXAM: PORTABLE CHEST 1 VIEW COMPARISON:  05/22/2015 and earlier. FINDINGS: Portable AP semi upright view at 0604 hours. Stable endotracheal tube tip at the level the clavicles. Enteric tube now in place, courses to the abdomen and tip not included. Multiple EKG leads and wires also overlie the chest. Stable right IJ central line. Continued low lung volumes with right greater than left basilar veiling opacity. No pneumothorax. No acute pulmonary edema. Mediastinal contours remain within normal limits. IMPRESSION: 1. Enteric tube placed and courses to the abdomen, tip not included. Otherwise, stable lines and tubes. 2. Stable ventilation. Low lung volumes with bibasilar opacity probably reflecting atelectasis and small effusions. Electronically Signed   By: Genevie Ann M.D.   On: 05/23/2015 07:36   Portable Chest Xray  05/22/2015  CLINICAL DATA:  Re-intubation today EXAM: PORTABLE CHEST 1 VIEW COMPARISON:  05/22/2015 FINDINGS: Unchanged position of endotracheal tube and internal jugular central line. Cardiac enlargement stable. Bibasilar hypoventilatory change with low lung volumes. IMPRESSION: Bilateral lower lobe atelectasis. Findings similar to prior study, with mildly increased opacity at the bases. Electronically Signed   By: Skipper Cliche M.D.   On: 05/22/2015 12:14   Dg Chest Port 1 View  05/22/2015  CLINICAL DATA:  Acute respiratory failure EXAM: PORTABLE CHEST 1 VIEW COMPARISON:  05/21/15 FINDINGS: Cardiomediastinal silhouette is stable. Endotracheal tube in place with tip 2.5 cm above the carina. NG tube in place. Right IJ central line is unchanged in position. Central mild vascular  congestion without convincing pulmonary edema persistent mild basilar atelectasis. No segmental infiltrate. IMPRESSION: Stable support apparatus. Central mild vascular congestion without convincing pulmonary edema. Persistent bilateral basilar atelectasis. Electronically Signed   By: Lahoma Crocker M.D.   On: 05/22/2015 07:50   Dg Chest Port 1 View  05/21/2015  CLINICAL DATA:  Acute respiratory failure EXAM: PORTABLE CHEST 1 VIEW COMPARISON:  05/19/2015 FINDINGS: Endotracheal tube terminates 5 cm above the carina. Mild bibasilar opacities, likely atelectasis. No frank interstitial edema. No pleural effusion or pneumothorax. The heart is normal in size. Right IJ venous catheter terminates the cavoatrial junction. Enteric tube courses into the mid stomach. IMPRESSION: Endotracheal tube terminates 5 cm above the carina. Mild bibasilar opacities, likely atelectasis. No frank interstitial edema. Electronically Signed   By: Julian Hy M.D.   On: 05/21/2015 10:45   Dg Chest Port 1 View  05/19/2015  CLINICAL DATA:  Hypoxia EXAM: PORTABLE CHEST 1 VIEW COMPARISON:  May 19, 2015 study obtained earlier in the day FINDINGS: Endotracheal tube tip is 9 mm above the carina. Central catheter tip is at the cavoatrial junction. Nasogastric tube tip and side port are below the diaphragm in the stomach. No pneumothorax. There is no edema or consolidation. Heart size and pulmonary vascularity are within normal limits. No adenopathy. No bone lesions apparent. IMPRESSION: Tube and catheter positions as described without pneumothorax. Note that the endotracheal tube tip is close to  the carina. It may be prudent to withdrawal endotracheal tube approximately 3 cm. No edema or consolidation. These results will be called to the ordering clinician or representative by the Radiologist Assistant, and communication documented in the PACS or zVision Dashboard. Electronically Signed   By: Lowella Grip III M.D.   On: 05/19/2015 11:16    Dg Chest Port 1 View  05/19/2015  CLINICAL DATA:  Acute respiratory failure EXAM: PORTABLE CHEST 1 VIEW COMPARISON:  May 18, 2015 FINDINGS: The ETT is in good position. No other change in support apparatus. No pneumothorax. Bibasilar atelectasis, right greater than left. No other interval changes. IMPRESSION: The ETT is in good position.  No other change. Electronically Signed   By: Dorise Bullion III M.D   On: 05/19/2015 08:11   Dg Chest Port 1 View  05/18/2015  CLINICAL DATA:  Acute respiratory failure.  Endotracheal tube. EXAM: PORTABLE CHEST 1 VIEW COMPARISON:  05/16/2015 FINDINGS: Endotracheal tube terminates approximately 2.5 cm above the carina. Right jugular central venous catheter terminates over the cavoatrial junction. Enteric tube courses into the left upper abdomen with tip not imaged. Lung volumes remain diminished with persistent mild patchy opacities in both lung bases. Trace bilateral pleural effusions are questioned. No pneumothorax. IMPRESSION: 1. Support devices as above. 2. Low lung volumes with persistent mild bibasilar opacities which may reflect atelectasis. Electronically Signed   By: Logan Bores M.D.   On: 05/18/2015 08:21   Dg Chest Portable 1 View  05/16/2015  CLINICAL DATA:  Status post central line placement EXAM: PORTABLE CHEST 1 VIEW COMPARISON:  05/15/2015 FINDINGS: Cardiac shadow is stable. An endotracheal tube is now seen and lies just above the carina and should be withdrawn 1-2 cm. A nasogastric catheter is seen coiled within the stomach. A right jugular central line is noted at the cavoatrial junction. No pneumothorax is seen. Bilateral pleural effusions are noted with bibasilar patchy opacities. No bony abnormality is seen. IMPRESSION: Endotracheal tube at the level of the carina and should be withdrawn 1-2 cm. No pneumothorax following central line placement. Small bilateral pleural effusions and bibasilar patchy opacities. These results will be called to the  ordering clinician or representative by the Radiologist Assistant, and communication documented in the PACS or zVision Dashboard. Electronically Signed   By: Inez Catalina M.D.   On: 05/16/2015 13:35   Dg Abd Portable 1v  06/01/2015  CLINICAL DATA:  Enteric tube placement EXAM: PORTABLE ABDOMEN - 1 VIEW COMPARISON:  05/26/2015 abdominal radiograph FINDINGS: Weighted enteric tube traverses the stomach and loops in the distal duodenum, with the tip of likely in the proximal jejunum versus fourth portion of the duodenum. No dilated small bowel loops. No evidence of pneumatosis or pneumoperitoneum. IMPRESSION: Weighted enteric tube terminates in a post pyloric position, either in the proximal jejunum or fourth portion of the duodenum. Electronically Signed   By: Ilona Sorrel M.D.   On: 06/01/2015 14:56   Dg Abd Portable 1v  05/26/2015  CLINICAL DATA:  Encounter for NG tube placement EXAM: PORTABLE ABDOMEN - 1 VIEW COMPARISON:  None. FINDINGS: The feeding tube extends into the stomach. The tip is within the fourth portion duodenum not changed from prior. IMPRESSION: No change in position of feeding tube. Electronically Signed   By: Suzy Bouchard M.D.   On: 05/26/2015 15:31   Dg Abd Portable 1v  05/22/2015  CLINICAL DATA:  Feeding tube placement EXAM: PORTABLE ABDOMEN - 1 VIEW COMPARISON:  05/19/2015 FINDINGS: NG tube is been exchange  for a feeding tube. Feeding tube tip is in the distal fourth portion of the duodenum. Normal bowel gas pattern. No significant dilatation or obstruction. No acute osseous finding. IMPRESSION: Feeding tube tip distal duodenum. Electronically Signed   By: Jerilynn Mages.  Shick M.D.   On: 05/22/2015 15:29   Dg Abd Portable 1v  05/19/2015  CLINICAL DATA:  Evaluate OG tube. EXAM: PORTABLE ABDOMEN - 1 VIEW COMPARISON:  May 16, 2015 FINDINGS: The OG tube terminates in the distal stomach. No other acute abnormalities identified. IMPRESSION: Appropriate placement of OG tube. Electronically Signed    By: Dorise Bullion III M.D   On: 05/19/2015 11:17   Dg Abd Portable 1v  05/16/2015  CLINICAL DATA:  OG tube placement EXAM: PORTABLE ABDOMEN - 1 VIEW COMPARISON:  None. FINDINGS: Enteric tube is in place with the tip in the very pyloric region and the side port in the distal stomach. Nonobstructive bowel gas pattern. IMPRESSION: Enteric tube tip in the peripyloric region. Electronically Signed   By: Rolm Baptise M.D.   On: 05/16/2015 13:34    Nicolette Bang, DO 06/06/2015, 9:39 AM PGY-1, [place] Intern pager: [phone], text pages welcome

## 2015-06-06 NOTE — Progress Notes (Signed)
Name: Frances Medina MRN: [id number] DOB: 1960/05/26    ADMISSION DATE:  05/15/2015 CONSULTATION DATE: 05/16/2015  REFERRING MD:   CHIEF COMPLAINT: Weakness, dysphagia, SOB in setting of known MG   HISTORY OF PRESENT ILLNESS:  55 y.o. female presenting to ED on 3/14 with increased weakness, SOB, and dysphagia in setting of MG flare. She was intubated and eventually underwent trach. We are following now for trach management    SUBJECTIVE Feels well. Looks excellent  VITAL SIGNS: BP 111/64 mmHg  Pulse 101  Temp(Src) 98.6 F (37 C) (Oral)  Resp 18  Ht 5' (1.524 m)  Wt 257 lb 1.6 oz (116.62 kg)  BMI 50.21 kg/m2  SpO2 92%  HEMODYNAMICS:    VENTILATOR SETTINGS: Vent Mode:  [-]  FiO2 (%):  [21 %-28 %] 21 %  INTAKE / OUTPUT: I/O last 3 completed shifts: In: 1575.8 [P.O.:300; I.V.:10; NG/GT:1065.8; IV Piggyback:200] Out: 375 [Urine:275; Stool:100]  PHYSICAL EXAMINATION: General:  Sitting up in chair. Alert. Awake. Integument:  Warm & dry. No rash on exposed skin.  HEENT:  No scleral injection. Tracheostomy in place. Excellent phonation Cardiovascular:  Regular rate. No edema. No appreciable JVD.  Pulmonary: Scattered rhonchi that cleared w/ cough.. Able to expectorate out mouth Collar. No further blood Abdomen: Soft. Normal bowel sounds. Nondistended.  Neurological:  Nonfocal. CN grossly in tact. Following commands.  LABS:  BMET  Recent Labs Lab 06/04/15 0500 06/05/15 0547 06/06/15 0607  NA 139 142 141  K 3.9 3.7 3.8  CL 103 104 102  CO2 27 29 29   BUN 13 14 9   CREATININE 0.42* 0.40* 0.45  GLUCOSE 99 108* 87    Recent Labs Lab 06/04/15 0500 06/05/15 0547 06/06/15 0607  WBC 8.8 9.2 9.4  HGB 9.8* 9.8* 10.1*  HCT 32.9* 32.5* 32.7*  PLT 336 320 334    Imaging No results found.   STUDIES:  CXR 3/14 >> Hypoinflation of the lungs with mild bibasilar subsegmental atelectasis  Abd X-ray 3/21: Feeding tube with tip in distal duodenum. Port CXR  3/22: Right internal jugular central venous catheter in good position. Endotracheal tube in good position. Low lung volumes. Minimal basilar atelectasis. Port CXR 3/22:  Trach in place w/ tip approximately 2.5cm above carina. No new opacity. R IJ CVL in place. Bilateral effusions right greater than left.  MICROBIOLOGY: Urine Ctx 3/14:  E coli MRSA Nasal Ctx 3/15:  Negative   ANTIBIOTICS: Bactrim 3/18 - 3/20   SIGNIFICANT EVENTS: 2/10 >> D/C from hospital  3/13 >> Onset of severe SOB and weakness  3/14 >> Present to ED  3/15 >> IVIG and ETT  3/18 >> Unsuccessful extubation , reintubated.  3/21 >> Unsuccessful extubation, reintubated 3/22 >> Trach placed by JY 4/4: down sized to 4 cuffless.  4/5 capping trials initiated  LINES/TUBES: Shiley #6 Cuffed (Placed by JY) 3/22>> changed to #4 cuffless.  R IJ CVL 3/15 >>  L Nare NGT 3/21 - 3/25; 3/25 - 3/31; 3/31>>> OETT 7.0 3/15 - 3/18; 3/18 - 3/21; 3/21 - 3/22  ASSESSMENT / PLAN:   55 y.o. Female s/p acute respiratory failure due to flare of myasthenia gravis. S/P tracheostomy 3/22 by Dr. Nelda Marseille. ->now down sized. Tolerated PMV well. Excellent phonation. Placed occlusive cap today. She actually likes this better. Her phonation quality is better. Her cough mechanics are excellent.  Plan We will keep the occlusive trach for the next 48 hours  If no issues we will decannulate Friday  Erick Colace ACNP-BC [place] Pager # [phone] OR # [phone] if no answer   Attending Note:  I have examined patient, reviewed labs, studies and notes. I have discussed the case with Jerrye Bushy, and I agree with the data and plans as amended above. Her strength and resp status have improved significant;y, probably back to baseline. Trach capped this am - she is tolerating well. If no issues we will see her for decannulation in 48h.  Baltazar Apo, MD, PhD 06/06/2015, 1:21 PM [facility] [place] [facility] [phone] or if no  answer [phone]

## 2015-06-06 NOTE — Progress Notes (Addendum)
Occupational Therapy Treatment and Discharge Patient Details Name: Frances Medina MRN: [id number] DOB: 1961/02/13 Today's Date: 06/06/2015    History of present illness pt presents with c/o difficulty swallowing for several days and SOB.  pt found to be in Myasthenia Gravis Flare up with recent diagnosis of Myasthenia Gravis in January and a flare up in February.  pt intubated on 3/15, extubated 3/18 with re-intubation on 3/18 and on vent for Eval. Attempted extubation 3/21 unsuccessfully and pt reintubated.  Trach placed on 3/22. Pt with hx of HTN, Depression, Anxiety, and Migraines.  pt currently undergoing work-up for Splenomegaly with Lymphadenopathy.   OT comments  Pt is routinely performing ADL with set up to modified independently. No further OT needs.  Follow Up Recommendations  No OT follow up    Equipment Recommendations  None recommended by OT    Recommendations for Other Services      Precautions / Restrictions Precautions Precaution Comments: watch O2       Mobility Bed Mobility               General bed mobility comments: pt in chair  Transfers Overall transfer level: Modified independent Equipment used: None             General transfer comment: cues to control descent to chair using UEs    Balance     Sitting balance-Leahy Scale: Good       Standing balance-Leahy Scale: Good                     ADL Overall ADL's : Modified independent                                 Tub/ Shower Transfer: Tub transfer;Modified independent;Ambulation Tub/Shower Transfer Details (indicate cue type and reason): plans to showera at night when family is home Functional mobility during ADLs: Modified independent General ADL Comments: Pt is routinely performing eating, bathing, dressing and toileting independently. Did not use AD for ambulation within her room. Pt plans to stand to shower upon return home.       Vision                      Perception     Praxis      Cognition   Behavior During Therapy: WFL for tasks assessed/performed Overall Cognitive Status: Within Functional Limits for tasks assessed                       Extremity/Trunk Assessment               Exercises     Shoulder Instructions       General Comments      Pertinent Vitals/ Pain       Pain Assessment: No/denies pain  Home Living                                          Prior Functioning/Environment              Frequency       Progress Toward Goals  OT Goals(current goals can now be found in the care plan section)  Progress towards OT goals: Goals met/education completed, patient discharged from OT  Acute Rehab OT Goals Patient Stated Goal: Back  to normal.  Plan Discharge plan remains appropriate    Co-evaluation                 End of Session Equipment Utilized During Treatment: Gait belt   Activity Tolerance Patient tolerated treatment well   Patient Left in chair;with call bell/phone within reach   Nurse Communication          Time: 6010-9323 OT Time Calculation (min): 12 min  Charges: OT General Charges $OT Visit: 1 Procedure OT Treatments $Self Care/Home Management : 8-22 mins  Malka So 06/06/2015, 11:00 AM  [phone]

## 2015-06-07 DIAGNOSIS — G43809 Other migraine, not intractable, without status migrainosus: Secondary | ICD-10-CM

## 2015-06-07 LAB — RENAL FUNCTION PANEL
Albumin: 2.9 g/dL — ABNORMAL LOW (ref 3.5–5.0)
Anion gap: 11 (ref 5–15)
BUN: 7 mg/dL (ref 6–20)
CO2: 27 mmol/L (ref 22–32)
Calcium: 9.3 mg/dL (ref 8.9–10.3)
Chloride: 102 mmol/L (ref 101–111)
Creatinine, Ser: 0.56 mg/dL (ref 0.44–1.00)
GFR calc Af Amer: >60 mL/min (ref >60)
GFR calc non Af Amer: >60 mL/min (ref >60)
Glucose, Bld: 100 mg/dL — ABNORMAL HIGH (ref 65–99)
Phosphorus: 4.1 mg/dL (ref 2.5–4.6)
Potassium: 4.1 mmol/L (ref 3.5–5.1)
Sodium: 140 mmol/L (ref 135–145)

## 2015-06-07 LAB — CBC WITH DIFFERENTIAL/PLATELET
Basophils Absolute: 0 10*3/uL (ref 0.0–0.1)
Basophils Relative: 0 %
Eosinophils Absolute: 0 10*3/uL (ref 0.0–0.7)
Eosinophils Relative: 0 %
HCT: 34.4 % — ABNORMAL LOW (ref 36.0–46.0)
Hemoglobin: 10.8 g/dL — ABNORMAL LOW (ref 12.0–15.0)
Lymphocytes Relative: 21 %
Lymphs Abs: 1.8 10*3/uL (ref 0.7–4.0)
MCH: 25.8 pg — ABNORMAL LOW (ref 26.0–34.0)
MCHC: 31.4 g/dL (ref 30.0–36.0)
MCV: 82.1 fL (ref 78.0–100.0)
Monocytes Absolute: 0.4 10*3/uL (ref 0.1–1.0)
Monocytes Relative: 4 %
Neutro Abs: 6.4 10*3/uL (ref 1.7–7.7)
Neutrophils Relative %: 75 %
Platelets: 344 10*3/uL (ref 150–400)
RBC: 4.19 MIL/uL (ref 3.87–5.11)
RDW: 18.6 % — ABNORMAL HIGH (ref 11.5–15.5)
WBC: 8.5 10*3/uL (ref 4.0–10.5)

## 2015-06-07 LAB — MAGNESIUM: Magnesium: 2.2 mg/dL (ref 1.7–2.4)

## 2015-06-07 MED ORDER — SODIUM CHLORIDE 0.9 % IV BOLUS (SEPSIS)
1000.0000 mL | Freq: Once | INTRAVENOUS | Status: AC
  Administered 2015-06-07: 1000 mL via INTRAVENOUS

## 2015-06-07 MED ORDER — LORAZEPAM 2 MG/ML IJ SOLN
0.5000 mg | Freq: Once | INTRAMUSCULAR | Status: AC
  Administered 2015-06-07: 0.5 mg via INTRAVENOUS
  Filled 2015-06-07: qty 1

## 2015-06-07 NOTE — Progress Notes (Signed)
Notified Dr. B with family medicine about pt HR trending 130's when pt ambulating, asymptomatic. Maintaining 80'-90's while sitting still. MD to see pt. Will continue to monitor. Carroll Kinds RN

## 2015-06-07 NOTE — Progress Notes (Signed)
Physical Therapy Treatment Patient Details Name: Frances Medina MRN: [id number] DOB: 03-Mar-1961 Today's Date: 06/07/2015    History of Present Illness pt presents with c/o difficulty swallowing for several days and SOB.  pt found to be in Myasthenia Gravis Flare up with recent diagnosis of Myasthenia Gravis in January and a flare up in February.  pt intubated on 3/15, extubated 3/18 with re-intubation on 3/18 and on vent for Eval. Attempted extubation 3/21 unsuccessfully and pt reintubated.  Trach placed on 3/22. Pt with hx of HTN, Depression, Anxiety, and Migraines.  pt currently undergoing work-up for Splenomegaly with Lymphadenopathy.    PT Comments    Mayre continues to make good progress, ambulating w/o an AD this session and tolerating high level balance activities w/o instability.  HR up to 148 w/ ambulation and exercises but quickly down to 120-130s w/ rest.    Follow Up Recommendations  Home health PT;Supervision - Intermittent     Equipment Recommendations  None recommended by PT    Recommendations for Other Services       Precautions / Restrictions Precautions Precautions: Fall Precaution Comments: watch HR, O2 Restrictions Weight Bearing Restrictions: No    Mobility  Bed Mobility               General bed mobility comments: pt in chair  Transfers Overall transfer level: Needs assistance Equipment used: None Transfers: Sit to/from Stand Sit to Stand: Supervision         General transfer comment: Supervision for safety  Ambulation/Gait Ambulation/Gait assistance: Supervision Ambulation Distance (Feet): 300 Feet Assistive device: None Gait Pattern/deviations: Step-through pattern;Decreased stride length   Gait velocity interpretation: Below normal speed for age/gender General Gait Details: SpO2 remains in high 90's, HR up to 148 but quickly down to 120-130s w/ rest.  Pt tolerated high level balance activities as documented below w/o  instability.   Stairs            Wheelchair Mobility    Modified Rankin (Stroke Patients Only)       Balance Overall balance assessment: Needs assistance Sitting-balance support: Feet supported;No upper extremity supported Sitting balance-Leahy Scale: Normal     Standing balance support: No upper extremity supported;During functional activity Standing balance-Leahy Scale: Good               High level balance activites: Side stepping;Backward walking;Direction changes;Turns;Head turns;Other (comment) (stepping over object) High Level Balance Comments: No instability noted w/ high level balance activities    Cognition Arousal/Alertness: Awake/alert Behavior During Therapy: WFL for tasks assessed/performed Overall Cognitive Status: Within Functional Limits for tasks assessed                      Exercises General Exercises - Lower Extremity Hip Flexion/Marching: 10 reps;Standing Mini-Sqauts: Strengthening;10 reps;Standing    General Comments General comments (skin integrity, edema, etc.): HR up to 148 w/ ambulation and exercises but quickly down to 120s-130s w/ rest      Pertinent Vitals/Pain Pain Assessment: No/denies pain Pain Intervention(s): Limited activity within patient's tolerance;Monitored during session    Home Living                      Prior Function            PT Goals (current goals can now be found in the care plan section) Acute Rehab PT Goals Patient Stated Goal: to go home tomorrow and spend time with her grandchildren Progress towards PT goals: Progressing  toward goals    Frequency  Min 3X/week    PT Plan Current plan remains appropriate    Co-evaluation             End of Session Equipment Utilized During Treatment: Gait belt Activity Tolerance: Patient limited by fatigue;Patient tolerated treatment well Patient left: in chair;with call bell/phone within reach     Time: 0934-0950 PT Time  Calculation (min) (ACUTE ONLY): 16 min  Charges:  $Gait Training: 8-22 mins                    G CodesCollie Siad 2015-06-24, 11:05 AM

## 2015-06-07 NOTE — Progress Notes (Signed)
Family Medicine Teaching Service Daily Progress Note Intern Pager: [phone]  Patient name: Frances Medina Medical record number: [id number] Date of birth: 03-25-1960 Age: 55 y.o. Gender: female  Primary Care Provider: Andrena Mews, MD Consultants: Pulmonology, Neurology Code Status: Full  Pt Overview and Major Events to Date:  2/10 >> D/C from hospital  3/13 >> Onset of severe SOB and weakness  3/14 >> Presented to ED  3/15 >> Transferred to Neuro ICU, IVIG and ETT  3/18 >> Unsuccessful extubation , reintubated.  3/19 >> Completed 5 days IVIG 3/21 >> Unsuccessful extubation, reintubated 3/22 >> Trach placed, solu-medrol 3/22  3/27 >> Neurology signed off 3/28 >> Began IV vancomycin and imipenem 3/30 >> Vancomycin discontinued as trach with just oral flora 3/31 >> Lost NG tube this morning; replaced; Trach sutures removed by CCM  4/4 >> Trach successfully downsized   Assessment and Plan: Frances Medina is a 55 y.o. female presenting with myasthenia gravis exacerbation. PMH is significant for HTN,uterine fibroids, depression, anxiety, migraines and recently diagnosed myasthenia gravis, and undergoing work-up of splenomegaly with lymphadenopathy.   Myasthenia Gravis Exacerbation: Unclear trigger of symptoms, though discontinuation of steroids is most likely cause. Pt is s/p ICU stay and intubation Follows with Neurologist Dr. Audelia [facility]on [facility]. She has tolerated trach collar placed 3/22.   - Pulmonology following: trach downsized on 4/4, plan to decannulate on 4/7 if doing well  - SLP: dysphagia II diet ordered  - Continue home prn albuterol for wheezing/SOB - PT/OT consulted --> HH PT, intermittent supervision    Presumed VAP, Treated: Received Vancomycin 3/28 >> 3/30. IV Imipenem ended on 4/4 to complete 7 day course of treatment. Blood cultures NG.  -continue to monitor respiratory status   Headache: Headache consistent with congestion.  -  Oxycodone 5 mg q4h  prn  - Flonase BID for sinus congestion.    Diarrhea: Improving. Suspect from increase in pyridostigmine.  - C. Diff panel resulted as antibody positive, toxin negative, so will not start flagyl  HFpEF: ECHO 04/09/15 performed due to SOB showed EF of 60-65% and G1DD. Patient currently appears euvolemic.  Splenomegaly w/Lymphadenopathy: Being followed by Oncologist Dr. Irene Limbo. Work-up thus far has shown HIV neg, mono screen neg, elevated LDH at 395 and low haptoglobin at <10 (with recent IVIG treatment). To repeat hemolytic markers, CBC and Coombs' test later this month at follow-up. No hypermetabolic masses or lymphadenopathy seen on PET scan 05/14/15.  - Continue to monitor CBCs  Hx of HTN: stable - Not on any home anti-hypertensives - Continue to monitor  Depression and Anxiety: - Zoloft 50 mg daily daily - Klonopin 0.5 mg BID  E. Coli UTI: s/p 3 days bactrim  FEN/GI: Dysphagia II Diet, pepcid Prophylaxis: Heparin  Disposition: Pending CCM recs regarding trach  Subjective:  Notes she got worked up overnight and couldn't sleep until given Ativan. Now feeling well with no complaints.   Objective: Temp:  [97.9 F (36.6 C)-98.5 F (36.9 C)] 97.9 F (36.6 C) (04/06 0643) Pulse Rate:  [85-123] 123 (04/06 0928) Resp:  [16-18] 16 (04/06 0928) BP: (114-123)/(66-88) 115/76 mmHg (04/06 0643) SpO2:  [91 %-100 %] 99 % (04/06 0928) FiO2 (%):  [21 %] 21 % (04/05 1527) Weight:  [256 lb 1.6 oz (116.166 kg)] 256 lb 1.6 oz (116.166 kg) (04/06 6314) Physical Exam: General: Obese female, sleeping in recliner, in NAD. Pleasant Cardiovascular: RRR, S1, S2, no m/r/g Chest: CTAB without wheezing, rhonchi, or crackles, trach in place and clean  Abdomen: +BS, soft, NT, ND Extremities: Moves all spontaneously, strength grossly intact   Laboratory:  Recent Labs Lab 06/05/15 0547 06/06/15 0607 06/07/15 0614  WBC 9.2 9.4 8.5  HGB 9.8* 10.1* 10.8*  HCT 32.5* 32.7* 34.4*  PLT 320 334 344     Recent Labs Lab 06/05/15 0547 06/06/15 0607 06/07/15 0614  NA 142 141 140  K 3.7 3.8 4.1  CL 104 102 102  CO2 '29 29 27  ' BUN '14 9 7  ' CREATININE 0.40* 0.45 0.56  CALCIUM 9.0 9.2 9.3  GLUCOSE 108* 87 100*    Imaging/Diagnostic Tests: Dg Chest 1 View  05/28/2015  CLINICAL DATA:  Patient with respiratory failure. Shortness of breath. EXAM: CHEST 1 VIEW COMPARISON:  Chest radiograph 05/23/2015 FINDINGS: Tracheostomy tube terminates in the mid trachea. Enteric tube courses inferior to the diaphragm. Right upper extremity PICC line is present with tip projecting over the right atrium. Low lung volumes. Stable cardiac and mediastinal contours. Small bilateral pleural effusions. Bibasilar airspace opacities. No pneumothorax. IMPRESSION: Right upper extremity PICC line is present with tip projecting over the right atrium. Low lung volumes, small bilateral pleural effusions and underlying opacities favored to represent atelectasis or infection. Associated interstitial pulmonary edema. Electronically Signed   By: Lovey Newcomer M.D.   On: 05/28/2015 13:06   Dg Chest 2 View  05/15/2015  CLINICAL DATA:  Shortness of breath. EXAM: CHEST  2 VIEW COMPARISON:  April 07, 2015. FINDINGS: The heart size and mediastinal contours are within normal limits. Hypoinflation of the lungs is noted. Mild bibasilar subsegmental atelectasis is noted. No pneumothorax or pleural effusion is noted. The visualized skeletal structures are unremarkable. IMPRESSION: Hypoinflation of the lungs with mild bibasilar subsegmental atelectasis. Electronically Signed   By: Marijo Conception, M.D.   On: 05/15/2015 21:06   Nm Pet Image Initial (pi) Skull Base To Thigh  05/14/2015  CLINICAL DATA:  Initial treatment strategy for lymphadenopathy and splenomegaly. Possibly lymphoproliferative disease. Myasthenia gravis. EXAM: NUCLEAR MEDICINE PET SKULL BASE TO THIGH TECHNIQUE: 14.8 mCi F-18 FDG was injected intravenously. Full-ring PET imaging  was performed from the skull base to thigh after the radiotracer. CT data was obtained and used for attenuation correction and anatomic localization. FASTING BLOOD GLUCOSE:  Value: 97 mg/dl COMPARISON:  Chest CTA on 04/07/2015 FINDINGS: NECK No hypermetabolic lymph nodes in the neck. CHEST No hypermetabolic mediastinal or hilar nodes. No suspicious pulmonary nodules on the CT scan. ABDOMEN/PELVIS No abnormal hypermetabolic activity within the liver, pancreas, adrenal glands, or spleen. No evidence of splenomegaly. No hypermetabolic lymph nodes in the abdomen or pelvis. SKELETON No focal hypermetabolic activity to suggest skeletal metastasis. Mild diffusely increased hypermetabolic activity seen throughout the axial and appendicular bone marrow. IMPRESSION: No hypermetabolic masses or lymphadenopathy identified. Mild diffusely increased metabolic activity throughout the bone marrow, without focal metastatic lesions. This finding is of uncertain clinical significance, and is likely due to marrow stimulation. Bone marrow biopsy could be considered further evaluation if clinically warranted. Electronically Signed   By: Earle Gell M.D.   On: 05/14/2015 16:33   Dg Chest Port 1 View  05/23/2015  CLINICAL DATA:  Tracheostomy placement. EXAM: PORTABLE CHEST 1 VIEW COMPARISON:  Study obtained earlier in the day FINDINGS: There is now a tracheostomy present with the tracheostomy catheter tip 2.4 cm above the carina. Feeding tube tip is below the diaphragm. Central catheter tip is at the cavoatrial junction. No pneumothorax. There are bilateral pleural effusions, larger on the right than on the  left. There is atelectatic change in the bases. Heart is mildly enlarged. The pulmonary vascularity appears normal. No adenopathy. No bone lesions. IMPRESSION: Tube and catheter positions as described without pneumothorax. Mild cardiomegaly with bilateral pleural effusions. Suspect a degree of congestive heart failure. There is  bibasilar atelectasis. Electronically Signed   By: Lowella Grip III M.D.   On: 05/23/2015 16:17   Portable Chest Xray  05/23/2015  CLINICAL DATA:  55 year old female female with respiratory failure. Myasthenia gravis. Initial encounter. EXAM: PORTABLE CHEST 1 VIEW COMPARISON:  05/22/2015 and earlier. FINDINGS: Portable AP semi upright view at 0604 hours. Stable endotracheal tube tip at the level the clavicles. Enteric tube now in place, courses to the abdomen and tip not included. Multiple EKG leads and wires also overlie the chest. Stable right IJ central line. Continued low lung volumes with right greater than left basilar veiling opacity. No pneumothorax. No acute pulmonary edema. Mediastinal contours remain within normal limits. IMPRESSION: 1. Enteric tube placed and courses to the abdomen, tip not included. Otherwise, stable lines and tubes. 2. Stable ventilation. Low lung volumes with bibasilar opacity probably reflecting atelectasis and small effusions. Electronically Signed   By: Genevie Ann M.D.   On: 05/23/2015 07:36   Portable Chest Xray  05/22/2015  CLINICAL DATA:  Re-intubation today EXAM: PORTABLE CHEST 1 VIEW COMPARISON:  05/22/2015 FINDINGS: Unchanged position of endotracheal tube and internal jugular central line. Cardiac enlargement stable. Bibasilar hypoventilatory change with low lung volumes. IMPRESSION: Bilateral lower lobe atelectasis. Findings similar to prior study, with mildly increased opacity at the bases. Electronically Signed   By: Skipper Cliche M.D.   On: 05/22/2015 12:14   Dg Chest Port 1 View  05/22/2015  CLINICAL DATA:  Acute respiratory failure EXAM: PORTABLE CHEST 1 VIEW COMPARISON:  05/21/15 FINDINGS: Cardiomediastinal silhouette is stable. Endotracheal tube in place with tip 2.5 cm above the carina. NG tube in place. Right IJ central line is unchanged in position. Central mild vascular congestion without convincing pulmonary edema persistent mild basilar atelectasis.  No segmental infiltrate. IMPRESSION: Stable support apparatus. Central mild vascular congestion without convincing pulmonary edema. Persistent bilateral basilar atelectasis. Electronically Signed   By: Lahoma Crocker M.D.   On: 05/22/2015 07:50   Dg Chest Port 1 View  05/21/2015  CLINICAL DATA:  Acute respiratory failure EXAM: PORTABLE CHEST 1 VIEW COMPARISON:  05/19/2015 FINDINGS: Endotracheal tube terminates 5 cm above the carina. Mild bibasilar opacities, likely atelectasis. No frank interstitial edema. No pleural effusion or pneumothorax. The heart is normal in size. Right IJ venous catheter terminates the cavoatrial junction. Enteric tube courses into the mid stomach. IMPRESSION: Endotracheal tube terminates 5 cm above the carina. Mild bibasilar opacities, likely atelectasis. No frank interstitial edema. Electronically Signed   By: Julian Hy M.D.   On: 05/21/2015 10:45   Dg Chest Port 1 View  05/19/2015  CLINICAL DATA:  Hypoxia EXAM: PORTABLE CHEST 1 VIEW COMPARISON:  May 19, 2015 study obtained earlier in the day FINDINGS: Endotracheal tube tip is 9 mm above the carina. Central catheter tip is at the cavoatrial junction. Nasogastric tube tip and side port are below the diaphragm in the stomach. No pneumothorax. There is no edema or consolidation. Heart size and pulmonary vascularity are within normal limits. No adenopathy. No bone lesions apparent. IMPRESSION: Tube and catheter positions as described without pneumothorax. Note that the endotracheal tube tip is close to the carina. It may be prudent to withdrawal endotracheal tube approximately 3 cm. No edema or  consolidation. These results will be called to the ordering clinician or representative by the Radiologist Assistant, and communication documented in the PACS or zVision Dashboard. Electronically Signed   By: Lowella Grip III M.D.   On: 05/19/2015 11:16   Dg Chest Port 1 View  05/19/2015  CLINICAL DATA:  Acute respiratory failure  EXAM: PORTABLE CHEST 1 VIEW COMPARISON:  May 18, 2015 FINDINGS: The ETT is in good position. No other change in support apparatus. No pneumothorax. Bibasilar atelectasis, right greater than left. No other interval changes. IMPRESSION: The ETT is in good position.  No other change. Electronically Signed   By: Dorise Bullion III M.D   On: 05/19/2015 08:11   Dg Chest Port 1 View  05/18/2015  CLINICAL DATA:  Acute respiratory failure.  Endotracheal tube. EXAM: PORTABLE CHEST 1 VIEW COMPARISON:  05/16/2015 FINDINGS: Endotracheal tube terminates approximately 2.5 cm above the carina. Right jugular central venous catheter terminates over the cavoatrial junction. Enteric tube courses into the left upper abdomen with tip not imaged. Lung volumes remain diminished with persistent mild patchy opacities in both lung bases. Trace bilateral pleural effusions are questioned. No pneumothorax. IMPRESSION: 1. Support devices as above. 2. Low lung volumes with persistent mild bibasilar opacities which may reflect atelectasis. Electronically Signed   By: Logan Bores M.D.   On: 05/18/2015 08:21   Dg Chest Portable 1 View  05/16/2015  CLINICAL DATA:  Status post central line placement EXAM: PORTABLE CHEST 1 VIEW COMPARISON:  05/15/2015 FINDINGS: Cardiac shadow is stable. An endotracheal tube is now seen and lies just above the carina and should be withdrawn 1-2 cm. A nasogastric catheter is seen coiled within the stomach. A right jugular central line is noted at the cavoatrial junction. No pneumothorax is seen. Bilateral pleural effusions are noted with bibasilar patchy opacities. No bony abnormality is seen. IMPRESSION: Endotracheal tube at the level of the carina and should be withdrawn 1-2 cm. No pneumothorax following central line placement. Small bilateral pleural effusions and bibasilar patchy opacities. These results will be called to the ordering clinician or representative by the Radiologist Assistant, and  communication documented in the PACS or zVision Dashboard. Electronically Signed   By: Inez Catalina M.D.   On: 05/16/2015 13:35   Dg Abd Portable 1v  06/01/2015  CLINICAL DATA:  Enteric tube placement EXAM: PORTABLE ABDOMEN - 1 VIEW COMPARISON:  05/26/2015 abdominal radiograph FINDINGS: Weighted enteric tube traverses the stomach and loops in the distal duodenum, with the tip of likely in the proximal jejunum versus fourth portion of the duodenum. No dilated small bowel loops. No evidence of pneumatosis or pneumoperitoneum. IMPRESSION: Weighted enteric tube terminates in a post pyloric position, either in the proximal jejunum or fourth portion of the duodenum. Electronically Signed   By: Ilona Sorrel M.D.   On: 06/01/2015 14:56   Dg Abd Portable 1v  05/26/2015  CLINICAL DATA:  Encounter for NG tube placement EXAM: PORTABLE ABDOMEN - 1 VIEW COMPARISON:  None. FINDINGS: The feeding tube extends into the stomach. The tip is within the fourth portion duodenum not changed from prior. IMPRESSION: No change in position of feeding tube. Electronically Signed   By: Suzy Bouchard M.D.   On: 05/26/2015 15:31   Dg Abd Portable 1v  05/22/2015  CLINICAL DATA:  Feeding tube placement EXAM: PORTABLE ABDOMEN - 1 VIEW COMPARISON:  05/19/2015 FINDINGS: NG tube is been exchange for a feeding tube. Feeding tube tip is in the distal fourth portion of the duodenum.  Normal bowel gas pattern. No significant dilatation or obstruction. No acute osseous finding. IMPRESSION: Feeding tube tip distal duodenum. Electronically Signed   By: Jerilynn Mages.  Shick M.D.   On: 05/22/2015 15:29   Dg Abd Portable 1v  05/19/2015  CLINICAL DATA:  Evaluate OG tube. EXAM: PORTABLE ABDOMEN - 1 VIEW COMPARISON:  May 16, 2015 FINDINGS: The OG tube terminates in the distal stomach. No other acute abnormalities identified. IMPRESSION: Appropriate placement of OG tube. Electronically Signed   By: Dorise Bullion III M.D   On: 05/19/2015 11:17   Dg Abd  Portable 1v  05/16/2015  CLINICAL DATA:  OG tube placement EXAM: PORTABLE ABDOMEN - 1 VIEW COMPARISON:  None. FINDINGS: Enteric tube is in place with the tip in the very pyloric region and the side port in the distal stomach. Nonobstructive bowel gas pattern. IMPRESSION: Enteric tube tip in the peripyloric region. Electronically Signed   By: Rolm Baptise M.D.   On: 05/16/2015 13:34    Nicolette Bang, DO 06/07/2015, 9:30 AM PGY-1, [place] Intern pager: [phone], text pages welcome

## 2015-06-08 DIAGNOSIS — G7001 Myasthenia gravis with (acute) exacerbation: Secondary | ICD-10-CM

## 2015-06-08 MED ORDER — PYRIDOSTIGMINE BROMIDE 60 MG PO TABS: 60.0000 mg | ORAL_TABLET | Freq: Four times a day (QID) | ORAL | Status: DC

## 2015-06-08 MED ORDER — FAMOTIDINE 20 MG PO TABS: 20.0000 mg | ORAL_TABLET | Freq: Two times a day (BID) | ORAL | Status: DC

## 2015-06-08 NOTE — Procedures (Signed)
Trach removal  Pre-op dx:  Resolved respiratory failure Tracheostomy status  Post-op dx: Resolved respiratory failure S/p trach removal.   With pt sitting up in chair trach collar straps were removed and trach was withdrawn. At that point occlusive dressing was applied. Pt tolerated well.    Erick Colace ACNP-BC [place] Pager # [phone] OR # [phone] if no answer

## 2015-06-08 NOTE — Progress Notes (Signed)
Physical Therapy Treatment Patient Details Name: Frances Medina MRN: [id number] DOB: September 18, 1960 Today's Date: 06/08/2015    History of Present Illness pt presents with c/o difficulty swallowing for several days and SOB.  pt found to be in Myasthenia Gravis Flare up with recent diagnosis of Myasthenia Gravis in January and a flare up in February.  pt intubated on 3/15, extubated 3/18 with re-intubation on 3/18 and on vent for Eval. Attempted extubation 3/21 unsuccessfully and pt reintubated.  Trach placed on 3/22. Pt with hx of HTN, Depression, Anxiety, and Migraines.  pt currently undergoing work-up for Splenomegaly with Lymphadenopathy.    PT Comments    Pt performed increased activity including standing therapeutic exercises and high level balance activities.  Pt HR elevated to 140s.  SPO2 98%-100% on RA.  Pt with c/o mild dizziness.    Follow Up Recommendations  Home health PT;Supervision - Intermittent     Equipment Recommendations  None recommended by PT    Recommendations for Other Services       Precautions / Restrictions Precautions Precautions: Fall Precaution Comments: watch HR, O2 Restrictions Weight Bearing Restrictions: No    Mobility  Bed Mobility Overal bed mobility:  (Pt received in recliner chair.  )                Transfers Overall transfer level: Modified independent Equipment used: None Transfers: Sit to/from Stand Sit to Stand: Modified independent (Device/Increase time) Stand pivot transfers: Modified independent (Device/Increase time)       General transfer comment: good technique.  Ambulation/Gait Ambulation/Gait assistance: Supervision Ambulation Distance (Feet): 380 Feet Assistive device: None Gait Pattern/deviations: Step-through pattern;Decreased stride length;Wide base of support Gait velocity: decreased   General Gait Details: SpO2 remains in high 90's, HR up to 148 but quickly down to 120-130s w/ rest.  Pt tolerated high level  balance activities as documented below min instability noted only with modified SLS activities.     Stairs            Wheelchair Mobility    Modified Rankin (Stroke Patients Only)       Balance Overall balance assessment: Needs assistance   Sitting balance-Leahy Scale: Normal       Standing balance-Leahy Scale: Good                 High Level Balance Comments: Pt performed modified SLS activities with 6 inch step.  Pt performed with eyes open and eyes close requiring min guard with eyes close and minor LOB but able to right after opening eyes.      Cognition Arousal/Alertness: Awake/alert Behavior During Therapy: WFL for tasks assessed/performed Overall Cognitive Status: Within Functional Limits for tasks assessed                      Exercises General Exercises - Lower Extremity Hip ABduction/ADduction: AROM;Both;10 reps;Standing (B UE support.) Hip Flexion/Marching: AROM;Both;10 reps;Standing (unilateral UE support.) Toe Raises: AROM;Both;10 reps;Standing Mini-Sqauts: AROM;Both;10 reps;Standing    General Comments        Pertinent Vitals/Pain Pain Assessment: No/denies pain    Home Living                      Prior Function            PT Goals (current goals can now be found in the care plan section) Acute Rehab PT Goals Patient Stated Goal: to go home tomorrow and spend time with her grandchildren Potential to Achieve  Goals: Good Progress towards PT goals: Progressing toward goals    Frequency  Min 3X/week    PT Plan Current plan remains appropriate    Co-evaluation             End of Session Equipment Utilized During Treatment: Gait belt Activity Tolerance: Patient limited by fatigue;Patient tolerated treatment well Patient left: in chair;with call bell/phone within reach     Time: 1137-1201 PT Time Calculation (min) (ACUTE ONLY): 24 min  Charges:  $Gait Training: 8-22 mins $Therapeutic Exercise: 8-22  mins                    G Codes:      Cristela Blue 06-15-15, 12:18 PM  Governor Rooks, PTA pager [phone]

## 2015-06-08 NOTE — Progress Notes (Signed)
Discharge instructions received and discussed with pt and daughter. Care of Stoma, Home Health services  and Follow up discussed. Verbalized understanding. No questions or concerns expressed. Discharged to home via w/c to front entrance with daughter.

## 2015-06-08 NOTE — Discharge Instructions (Signed)
You were hospitalized for a severe exacerbation of your Myasthenia Gravis.   Stoma (Openning in Neck from Previously Placed Trach) Care Instructions:  Keep current dressing x 48hrs May shower as long as stoma covered After 48 hrs place band aid daily until stoma closed No submerging under water until stoma closed.   You will have home health nursing and physical therapy services at discharge.   Your Mestinon was increased from 3 times per day to 4 times per day. This has been sent to your pharmacy.

## 2015-06-08 NOTE — Progress Notes (Signed)
Name: Frances Medina MRN: [id number] DOB: 1960/09/20    ADMISSION DATE:  05/15/2015 CONSULTATION DATE: 05/16/2015  REFERRING MD:   CHIEF COMPLAINT: Weakness, dysphagia, SOB in setting of known MG   HISTORY OF PRESENT ILLNESS:  55 y.o. female presenting to ED on 3/14 with increased weakness, SOB, and dysphagia in setting of MG flare. She was intubated and eventually underwent trach. We are following now for trach management    SUBJECTIVE Feels well. Looks excellent  VITAL SIGNS: BP 110/77 mmHg  Pulse 89  Temp(Src) 97.8 F (36.6 C) (Oral)  Resp 18  Ht 5' (1.524 m)  Wt 256 lb 9.6 oz (116.393 kg)  BMI 50.11 kg/m2  SpO2 95% Room air HEMODYNAMICS:    VENTILATOR SETTINGS:    INTAKE / OUTPUT: I/O last 3 completed shifts: In: 1720 [P.O.:720; IV Piggyback:1000] Out: -   PHYSICAL EXAMINATION: General:  Sitting up in chair. Alert. Awake. No distress.  Integument:  Warm & dry. No rash on exposed skin.  HEENT:  No scleral injection. Tracheostomy in place. Excellent phonation Cardiovascular:  Regular rate. No edema. No appreciable JVD.  Pulmonary: Clear and w/out rhonchi. No upper airway wheeze w/ trach capped. Excellent phonation after trach removed. Abdomen: Soft. Normal bowel sounds. Nondistended.  Neurological:  Nonfocal. CN grossly in tact. Following commands.  LABS:  BMET  Recent Labs Lab 06/05/15 0547 06/06/15 0607 06/07/15 0614  NA 142 141 140  K 3.7 3.8 4.1  CL 104 102 102  CO2 29 29 27   BUN 14 9 7   CREATININE 0.40* 0.45 0.56  GLUCOSE 108* 87 100*    Recent Labs Lab 06/05/15 0547 06/06/15 0607 06/07/15 0614  WBC 9.2 9.4 8.5  HGB 9.8* 10.1* 10.8*  HCT 32.5* 32.7* 34.4*  PLT 320 334 344    Imaging No results found.   STUDIES:  CXR 3/14 >> Hypoinflation of the lungs with mild bibasilar subsegmental atelectasis  Abd X-ray 3/21: Feeding tube with tip in distal duodenum. Port CXR 3/22: Right internal jugular central venous catheter in good  position. Endotracheal tube in good position. Low lung volumes. Minimal basilar atelectasis. Port CXR 3/22:  Trach in place w/ tip approximately 2.5cm above carina. No new opacity. R IJ CVL in place. Bilateral effusions right greater than left.  MICROBIOLOGY: Urine Ctx 3/14:  E coli MRSA Nasal Ctx 3/15:  Negative   ANTIBIOTICS: Bactrim 3/18 - 3/20   SIGNIFICANT EVENTS: 2/10 >> D/C from hospital  3/13 >> Onset of severe SOB and weakness  3/14 >> Present to ED  3/15 >> IVIG and ETT  3/18 >> Unsuccessful extubation , reintubated.  3/21 >> Unsuccessful extubation, reintubated 3/22 >> Trach placed by JY 4/4: down sized to 4 cuffless.  4/5 capping trials initiated 4/7 decannulated  LINES/TUBES: Shiley #6 Cuffed (Placed by JY) 3/22>> changed to #4 cuffless.  R IJ CVL 3/15 >>  L Nare NGT 3/21 - 3/25; 3/25 - 3/31; 3/31>>> OETT 7.0 3/15 - 3/18; 3/18 - 3/21; 3/21 - 3/22  ASSESSMENT / PLAN:   55 y.o. Female s/p acute respiratory failure due to flare of myasthenia gravis. S/P tracheostomy 3/22 by Dr. Nelda Marseille. ->now down sized. Tolerated 48 hrs of occlusive capping trial well. Excellent phonation.  ->decannulated today 4/7. Occlusive dressing placed. Pt tolerated well  Plan Keep current dressing x 48hrs May shower as long as stoma covered After 48 hrs place band aid daily until stoma closed No submerging under water until stoma closed.  PCCM will  s/o. Call as needed.   Erick Colace ACNP-BC [place] Pager # [phone] OR # [phone] if no answer   Attending Note:

## 2015-06-08 NOTE — Progress Notes (Signed)
Family Medicine Teaching Service Daily Progress Note Intern Pager: [phone]  Patient name: Frances Medina Medical record number: [id number] Date of birth: 01-08-1961 Age: 55 y.o. Gender: female  Primary Care Provider: Andrena Mews, MD Consultants: Pulmonology, Neurology Code Status: Full  Pt Overview and Major Events to Date:  2/10 >> D/C from hospital  3/13 >> Onset of severe SOB and weakness  3/14 >> Presented to ED  3/15 >> Transferred to Neuro ICU, IVIG and ETT  3/18 >> Unsuccessful extubation , reintubated.  3/19 >> Completed 5 days IVIG 3/21 >> Unsuccessful extubation, reintubated 3/22 >> Trach placed, solu-medrol 3/22  3/27 >> Neurology signed off 3/28 >> Began IV vancomycin and imipenem 3/30 >> Vancomycin discontinued as trach with just oral flora 3/31 >> Lost NG tube this morning; replaced; Trach sutures removed by CCM  4/4 >> Trach successfully downsized   Assessment and Plan: Frances Medina is a 55 y.o. female presenting with myasthenia gravis exacerbation. PMH is significant for HTN,uterine fibroids, depression, anxiety, migraines and recently diagnosed myasthenia gravis, and undergoing work-up of splenomegaly with lymphadenopathy.   Myasthenia Gravis Exacerbation: Unclear trigger of symptoms, though discontinuation of steroids is most likely cause. Pt is s/p ICU stay and intubation Follows with Neurologist Dr. Audelia Acton of [facility]. She has tolerated trach collar placed 3/22.   - Pulmonology following: trach downsized on 4/4, plan to decannulate today - SLP: dysphagia II diet ordered  - Continue home prn albuterol for wheezing/SOB - PT/OT consulted --> HH PT, intermittent supervision    Presumed VAP, Treated: Received Vancomycin 3/28 >> 3/30. IV Imipenem ended on 4/4 to complete 7 day course of treatment. Blood cultures NG.  -continue to monitor respiratory status   Headache: Headache consistent with congestion.  -  Oxycodone 5 mg q4h prn  - Flonase  BID for sinus congestion.    Diarrhea: Improving. Suspect from increase in pyridostigmine.  - C. Diff panel resulted as antibody positive, toxin negative, so will not start flagyl  HFpEF: ECHO 04/09/15 performed due to SOB showed EF of 60-65% and G1DD. Patient currently appears euvolemic.  Splenomegaly w/Lymphadenopathy: Being followed by Oncologist Dr. Irene Limbo. Work-up thus far has shown HIV neg, mono screen neg, elevated LDH at 395 and low haptoglobin at <10 (with recent IVIG treatment). To repeat hemolytic markers, CBC and Coombs' test later this month at follow-up. No hypermetabolic masses or lymphadenopathy seen on PET scan 05/14/15.  - Continue to monitor CBCs  Hx of HTN: stable - Not on any home anti-hypertensives - Continue to monitor  Depression and Anxiety: - Zoloft 50 mg daily daily - Klonopin 0.5 mg BID  E. Coli UTI: s/p 3 days bactrim  FEN/GI: Dysphagia II Diet, pepcid Prophylaxis: Heparin  Disposition: Pending CCM recs regarding trach  Subjective:  Says she did well with PT yesterday. Very ready to go home. In good spirits today.   Objective: Temp:  [97.8 F (36.6 C)-98.7 F (37.1 C)] 97.8 F (36.6 C) (04/07 0500) Pulse Rate:  [83-108] 108 (04/07 0500) Resp:  [14-18] 18 (04/07 0500) BP: (109-121)/(69-77) 110/77 mmHg (04/07 0500) SpO2:  [95 %-99 %] 98 % (04/07 0500) Weight:  [256 lb 9.6 oz (116.393 kg)] 256 lb 9.6 oz (116.393 kg) (04/07 0500) Physical Exam: General: Obese female, sitting in recliner, in NAD. Pleasant Cardiovascular: RRR, S1, S2, no m/r/g Chest: CTAB without wheezing, rhonchi, or crackles, trach in place and clean Abdomen: +BS, soft, NT, ND Extremities: Moves all spontaneously, strength grossly intact   Laboratory:  Recent Labs Lab 06/05/15 0547 06/06/15 0607 06/07/15 0614  WBC 9.2 9.4 8.5  HGB 9.8* 10.1* 10.8*  HCT 32.5* 32.7* 34.4*  PLT 320 334 344    Recent Labs Lab 06/05/15 0547 06/06/15 0607 06/07/15 0614  NA 142 141 140  K  3.7 3.8 4.1  CL 104 102 102  CO2 '29 29 27  ' BUN '14 9 7  ' CREATININE 0.40* 0.45 0.56  CALCIUM 9.0 9.2 9.3  GLUCOSE 108* 87 100*    Imaging/Diagnostic Tests: Dg Chest 1 View  05/28/2015  CLINICAL DATA:  Patient with respiratory failure. Shortness of breath. EXAM: CHEST 1 VIEW COMPARISON:  Chest radiograph 05/23/2015 FINDINGS: Tracheostomy tube terminates in the mid trachea. Enteric tube courses inferior to the diaphragm. Right upper extremity PICC line is present with tip projecting over the right atrium. Low lung volumes. Stable cardiac and mediastinal contours. Small bilateral pleural effusions. Bibasilar airspace opacities. No pneumothorax. IMPRESSION: Right upper extremity PICC line is present with tip projecting over the right atrium. Low lung volumes, small bilateral pleural effusions and underlying opacities favored to represent atelectasis or infection. Associated interstitial pulmonary edema. Electronically Signed   By: Lovey Newcomer M.D.   On: 05/28/2015 13:06   Dg Chest 2 View  05/15/2015  CLINICAL DATA:  Shortness of breath. EXAM: CHEST  2 VIEW COMPARISON:  April 07, 2015. FINDINGS: The heart size and mediastinal contours are within normal limits. Hypoinflation of the lungs is noted. Mild bibasilar subsegmental atelectasis is noted. No pneumothorax or pleural effusion is noted. The visualized skeletal structures are unremarkable. IMPRESSION: Hypoinflation of the lungs with mild bibasilar subsegmental atelectasis. Electronically Signed   By: Marijo Conception, M.D.   On: 05/15/2015 21:06   Nm Pet Image Initial (pi) Skull Base To Thigh  05/14/2015  CLINICAL DATA:  Initial treatment strategy for lymphadenopathy and splenomegaly. Possibly lymphoproliferative disease. Myasthenia gravis. EXAM: NUCLEAR MEDICINE PET SKULL BASE TO THIGH TECHNIQUE: 14.8 mCi F-18 FDG was injected intravenously. Full-ring PET imaging was performed from the skull base to thigh after the radiotracer. CT data was obtained  and used for attenuation correction and anatomic localization. FASTING BLOOD GLUCOSE:  Value: 97 mg/dl COMPARISON:  Chest CTA on 04/07/2015 FINDINGS: NECK No hypermetabolic lymph nodes in the neck. CHEST No hypermetabolic mediastinal or hilar nodes. No suspicious pulmonary nodules on the CT scan. ABDOMEN/PELVIS No abnormal hypermetabolic activity within the liver, pancreas, adrenal glands, or spleen. No evidence of splenomegaly. No hypermetabolic lymph nodes in the abdomen or pelvis. SKELETON No focal hypermetabolic activity to suggest skeletal metastasis. Mild diffusely increased hypermetabolic activity seen throughout the axial and appendicular bone marrow. IMPRESSION: No hypermetabolic masses or lymphadenopathy identified. Mild diffusely increased metabolic activity throughout the bone marrow, without focal metastatic lesions. This finding is of uncertain clinical significance, and is likely due to marrow stimulation. Bone marrow biopsy could be considered further evaluation if clinically warranted. Electronically Signed   By: Earle Gell M.D.   On: 05/14/2015 16:33   Dg Chest Port 1 View  05/23/2015  CLINICAL DATA:  Tracheostomy placement. EXAM: PORTABLE CHEST 1 VIEW COMPARISON:  Study obtained earlier in the day FINDINGS: There is now a tracheostomy present with the tracheostomy catheter tip 2.4 cm above the carina. Feeding tube tip is below the diaphragm. Central catheter tip is at the cavoatrial junction. No pneumothorax. There are bilateral pleural effusions, larger on the right than on the left. There is atelectatic change in the bases. Heart is mildly enlarged. The pulmonary vascularity appears  normal. No adenopathy. No bone lesions. IMPRESSION: Tube and catheter positions as described without pneumothorax. Mild cardiomegaly with bilateral pleural effusions. Suspect a degree of congestive heart failure. There is bibasilar atelectasis. Electronically Signed   By: Lowella Grip III M.D.   On:  05/23/2015 16:17   Portable Chest Xray  05/23/2015  CLINICAL DATA:  55 year old female female with respiratory failure. Myasthenia gravis. Initial encounter. EXAM: PORTABLE CHEST 1 VIEW COMPARISON:  05/22/2015 and earlier. FINDINGS: Portable AP semi upright view at 0604 hours. Stable endotracheal tube tip at the level the clavicles. Enteric tube now in place, courses to the abdomen and tip not included. Multiple EKG leads and wires also overlie the chest. Stable right IJ central line. Continued low lung volumes with right greater than left basilar veiling opacity. No pneumothorax. No acute pulmonary edema. Mediastinal contours remain within normal limits. IMPRESSION: 1. Enteric tube placed and courses to the abdomen, tip not included. Otherwise, stable lines and tubes. 2. Stable ventilation. Low lung volumes with bibasilar opacity probably reflecting atelectasis and small effusions. Electronically Signed   By: Genevie Ann M.D.   On: 05/23/2015 07:36   Portable Chest Xray  05/22/2015  CLINICAL DATA:  Re-intubation today EXAM: PORTABLE CHEST 1 VIEW COMPARISON:  05/22/2015 FINDINGS: Unchanged position of endotracheal tube and internal jugular central line. Cardiac enlargement stable. Bibasilar hypoventilatory change with low lung volumes. IMPRESSION: Bilateral lower lobe atelectasis. Findings similar to prior study, with mildly increased opacity at the bases. Electronically Signed   By: Skipper Cliche M.D.   On: 05/22/2015 12:14   Dg Chest Port 1 View  05/22/2015  CLINICAL DATA:  Acute respiratory failure EXAM: PORTABLE CHEST 1 VIEW COMPARISON:  05/21/15 FINDINGS: Cardiomediastinal silhouette is stable. Endotracheal tube in place with tip 2.5 cm above the carina. NG tube in place. Right IJ central line is unchanged in position. Central mild vascular congestion without convincing pulmonary edema persistent mild basilar atelectasis. No segmental infiltrate. IMPRESSION: Stable support apparatus. Central mild vascular  congestion without convincing pulmonary edema. Persistent bilateral basilar atelectasis. Electronically Signed   By: Lahoma Crocker M.D.   On: 05/22/2015 07:50   Dg Chest Port 1 View  05/21/2015  CLINICAL DATA:  Acute respiratory failure EXAM: PORTABLE CHEST 1 VIEW COMPARISON:  05/19/2015 FINDINGS: Endotracheal tube terminates 5 cm above the carina. Mild bibasilar opacities, likely atelectasis. No frank interstitial edema. No pleural effusion or pneumothorax. The heart is normal in size. Right IJ venous catheter terminates the cavoatrial junction. Enteric tube courses into the mid stomach. IMPRESSION: Endotracheal tube terminates 5 cm above the carina. Mild bibasilar opacities, likely atelectasis. No frank interstitial edema. Electronically Signed   By: Julian Hy M.D.   On: 05/21/2015 10:45   Dg Chest Port 1 View  05/19/2015  CLINICAL DATA:  Hypoxia EXAM: PORTABLE CHEST 1 VIEW COMPARISON:  May 19, 2015 study obtained earlier in the day FINDINGS: Endotracheal tube tip is 9 mm above the carina. Central catheter tip is at the cavoatrial junction. Nasogastric tube tip and side port are below the diaphragm in the stomach. No pneumothorax. There is no edema or consolidation. Heart size and pulmonary vascularity are within normal limits. No adenopathy. No bone lesions apparent. IMPRESSION: Tube and catheter positions as described without pneumothorax. Note that the endotracheal tube tip is close to the carina. It may be prudent to withdrawal endotracheal tube approximately 3 cm. No edema or consolidation. These results will be called to the ordering clinician or representative by the Radiologist Assistant,  and communication documented in the PACS or zVision Dashboard. Electronically Signed   By: Lowella Grip III M.D.   On: 05/19/2015 11:16   Dg Chest Port 1 View  05/19/2015  CLINICAL DATA:  Acute respiratory failure EXAM: PORTABLE CHEST 1 VIEW COMPARISON:  May 18, 2015 FINDINGS: The ETT is in good  position. No other change in support apparatus. No pneumothorax. Bibasilar atelectasis, right greater than left. No other interval changes. IMPRESSION: The ETT is in good position.  No other change. Electronically Signed   By: Dorise Bullion III M.D   On: 05/19/2015 08:11   Dg Chest Port 1 View  05/18/2015  CLINICAL DATA:  Acute respiratory failure.  Endotracheal tube. EXAM: PORTABLE CHEST 1 VIEW COMPARISON:  05/16/2015 FINDINGS: Endotracheal tube terminates approximately 2.5 cm above the carina. Right jugular central venous catheter terminates over the cavoatrial junction. Enteric tube courses into the left upper abdomen with tip not imaged. Lung volumes remain diminished with persistent mild patchy opacities in both lung bases. Trace bilateral pleural effusions are questioned. No pneumothorax. IMPRESSION: 1. Support devices as above. 2. Low lung volumes with persistent mild bibasilar opacities which may reflect atelectasis. Electronically Signed   By: Logan Bores M.D.   On: 05/18/2015 08:21   Dg Chest Portable 1 View  05/16/2015  CLINICAL DATA:  Status post central line placement EXAM: PORTABLE CHEST 1 VIEW COMPARISON:  05/15/2015 FINDINGS: Cardiac shadow is stable. An endotracheal tube is now seen and lies just above the carina and should be withdrawn 1-2 cm. A nasogastric catheter is seen coiled within the stomach. A right jugular central line is noted at the cavoatrial junction. No pneumothorax is seen. Bilateral pleural effusions are noted with bibasilar patchy opacities. No bony abnormality is seen. IMPRESSION: Endotracheal tube at the level of the carina and should be withdrawn 1-2 cm. No pneumothorax following central line placement. Small bilateral pleural effusions and bibasilar patchy opacities. These results will be called to the ordering clinician or representative by the Radiologist Assistant, and communication documented in the PACS or zVision Dashboard. Electronically Signed   By: Inez Catalina M.D.   On: 05/16/2015 13:35   Dg Abd Portable 1v  06/01/2015  CLINICAL DATA:  Enteric tube placement EXAM: PORTABLE ABDOMEN - 1 VIEW COMPARISON:  05/26/2015 abdominal radiograph FINDINGS: Weighted enteric tube traverses the stomach and loops in the distal duodenum, with the tip of likely in the proximal jejunum versus fourth portion of the duodenum. No dilated small bowel loops. No evidence of pneumatosis or pneumoperitoneum. IMPRESSION: Weighted enteric tube terminates in a post pyloric position, either in the proximal jejunum or fourth portion of the duodenum. Electronically Signed   By: Ilona Sorrel M.D.   On: 06/01/2015 14:56   Dg Abd Portable 1v  05/26/2015  CLINICAL DATA:  Encounter for NG tube placement EXAM: PORTABLE ABDOMEN - 1 VIEW COMPARISON:  None. FINDINGS: The feeding tube extends into the stomach. The tip is within the fourth portion duodenum not changed from prior. IMPRESSION: No change in position of feeding tube. Electronically Signed   By: Suzy Bouchard M.D.   On: 05/26/2015 15:31   Dg Abd Portable 1v  05/22/2015  CLINICAL DATA:  Feeding tube placement EXAM: PORTABLE ABDOMEN - 1 VIEW COMPARISON:  05/19/2015 FINDINGS: NG tube is been exchange for a feeding tube. Feeding tube tip is in the distal fourth portion of the duodenum. Normal bowel gas pattern. No significant dilatation or obstruction. No acute osseous finding. IMPRESSION: Feeding tube  tip distal duodenum. Electronically Signed   By: Jerilynn Mages.  Shick M.D.   On: 05/22/2015 15:29   Dg Abd Portable 1v  05/19/2015  CLINICAL DATA:  Evaluate OG tube. EXAM: PORTABLE ABDOMEN - 1 VIEW COMPARISON:  May 16, 2015 FINDINGS: The OG tube terminates in the distal stomach. No other acute abnormalities identified. IMPRESSION: Appropriate placement of OG tube. Electronically Signed   By: Dorise Bullion III M.D   On: 05/19/2015 11:17   Dg Abd Portable 1v  05/16/2015  CLINICAL DATA:  OG tube placement EXAM: PORTABLE ABDOMEN - 1 VIEW  COMPARISON:  None. FINDINGS: Enteric tube is in place with the tip in the very pyloric region and the side port in the distal stomach. Nonobstructive bowel gas pattern. IMPRESSION: Enteric tube tip in the peripyloric region. Electronically Signed   By: Rolm Baptise M.D.   On: 05/16/2015 13:34    Nicolette Bang, DO 06/08/2015, 9:39 AM PGY-1, [place] Intern pager: [phone], text pages welcome

## 2015-06-11 ENCOUNTER — Other Ambulatory Visit: Payer: Self-pay | Admitting: Family Medicine

## 2015-06-11 NOTE — Telephone Encounter (Signed)
Pt is calling because she needs a refill on her Clonazepam called in. The dosage was changed to 2 times a day and she wants to make sure that we get this in before she runs out. Frances Medina

## 2015-06-12 MED ORDER — CLONAZEPAM 1 MG PO TABS: 0.5000 mg | ORAL_TABLET | Freq: Two times a day (BID) | ORAL | Status: DC | PRN

## 2015-06-12 NOTE — Telephone Encounter (Signed)
Please confirm if she is taking 1/2 or a whole dose of her Klonopin

## 2015-06-12 NOTE — Telephone Encounter (Signed)
Dosage is 0.5-1 mg 2x daily for the Clonazepam.

## 2015-06-12 NOTE — Telephone Encounter (Signed)
LM for patient to call back and please clarify what dose of medication she is on.  [employer]

## 2015-06-18 ENCOUNTER — Ambulatory Visit (INDEPENDENT_AMBULATORY_CARE_PROVIDER_SITE_OTHER): Payer: BC Managed Care – PPO | Admitting: Internal Medicine

## 2015-06-18 ENCOUNTER — Encounter: Payer: Self-pay | Admitting: Internal Medicine

## 2015-06-18 VITALS — BP 116/75 | HR 90 | Temp 97.7°F | Ht 60.0 in | Wt 258.0 lb

## 2015-06-18 DIAGNOSIS — Z09 Encounter for follow-up examination after completed treatment for conditions other than malignant neoplasm: Secondary | ICD-10-CM

## 2015-06-18 DIAGNOSIS — G7000 Myasthenia gravis without (acute) exacerbation: Secondary | ICD-10-CM | POA: Diagnosis not present

## 2015-06-18 NOTE — Patient Instructions (Signed)
Thank you for coming!  Please make sure to make an appointment with your neurologist.  Also make an appointment with Dr. Gwendlyn Deutscher for a well woman exam.

## 2015-06-18 NOTE — Assessment & Plan Note (Signed)
Hospital follow up. Doing well. Is back at baseline - is planning on making a follow up appointment with her neurologist next week - continue Mestinon QID

## 2015-06-18 NOTE — Progress Notes (Signed)
Patient ID: Frances Medina, female   DOB: [birth year], 55 y.o.   MRN: [id number] Date of Visit: 06/18/2015   HPI: Patient is here for hospital follow up. She was hospitalized from 3/14 to 4/7 for myasthenia gravis exacerbation.   - patient doing well and has no concerns - no dysphagia, dyspnea, or weakness. She does walk with a cane which is her baseline - has not seen her neurologist but plans to make a follow up appointment for Monday - it taking Mestonin QID as prescribed. No issues with diarrhea ro increased secretions - Is currently following Dysphagia II diet; she personally feels comfortable with continuing with current diet and declines SLP re-evaluation as an outpatient  - her stoma s/p tracheostomy has healed appropriately   ROS: See HPI.  [place]:  Myasthenia Gravis  PHYSICAL EXAM: BP 116/75 mmHg  Pulse 90  Temp(Src) 97.7 F (36.5 C) (Oral)  Ht 5' (1.524 m)  Wt 258 lb (117.028 kg)  BMI 50.39 kg/m2  SpO2 96% GEN: NAD Neck: healed stoma site without erythema, tenderness, or increased warmth CV: RRR, no murmurs, rubs, or gallops PULM: CTAB, normal effort ABD: Soft, nontender, nondistended, NABS, no organomegaly SKIN: No rash or cyanosis; warm and well-perfused EXTR: No lower extremity edema or calf tenderness NEURO: Awake, alert, no focal deficits grossly, normal speech  ASSESSMENT/PLAN:  Health maintenance:  - advised to follow up with PCP for annual wellness visit   Myasthenia gravis [facility]) Hospital follow up. Doing well. Is back at baseline - is planning on making a follow up appointment with her neurologist next week - continue Mestinon QID    FOLLOW UP: Follow up with PCP   Smiley Houseman, MD [place]

## 2015-06-22 ENCOUNTER — Telehealth: Payer: Self-pay | Admitting: Family Medicine

## 2015-06-22 DIAGNOSIS — R1310 Dysphagia, unspecified: Secondary | ICD-10-CM

## 2015-06-22 DIAGNOSIS — G7000 Myasthenia gravis without (acute) exacerbation: Secondary | ICD-10-CM

## 2015-06-22 NOTE — Telephone Encounter (Signed)
I got a call from patient daughter York Cerise this morning. Her mom started having difficulty swallowing last night. She is not able to get anything down except water. I recommended that she eats small frequent meal well chewed. She should also be on dysphagia diet. Avoid talking when eating to allow strength for mastication. Eat 1 hr after taking her mestinon. I also advised speech pathology assessment. I will place referral. As discussed with her daughter if there is no improvement or symptoms worsened she should go to the hospital. I also advised she get in touch with her neurologist to check if she needed to get back on steroid. She verbalized understanding of all instructions given.

## 2015-06-22 NOTE — Telephone Encounter (Signed)
As soon as the referral is placed, it will be sent to Speech Therapy at [facility].

## 2015-06-25 ENCOUNTER — Telehealth: Payer: Self-pay | Admitting: Family Medicine

## 2015-06-25 NOTE — Telephone Encounter (Signed)
Will forward to MD. Azalea Cedar,CMA  

## 2015-06-25 NOTE — Telephone Encounter (Signed)
Please call back to give them verbal order approved by me.

## 2015-06-25 NOTE — Addendum Note (Signed)
Addended by: Andrena Mews T on: 06/25/2015 08:30 AM   Modules accepted: Orders

## 2015-06-25 NOTE — Telephone Encounter (Addendum)
I reviewed her med, she is not on Bromide containing medication that am aware of. Can you clarify which meds they are referring to?  Also let [facility] know that the neurologist's office should have a covering physician if one is out of the office. Advice they speak with the covering neurologist if they prescribed her both medications in question.

## 2015-06-25 NOTE — Telephone Encounter (Signed)
Spoke with [facility] and there is an interaction between patient's prednisolone and her bromide.  He has tried to contact the neurologist office but he is out of town this week.  Would like to know if one of the medications should be changed.  Please advise. Jazmin Hartsell,CMA

## 2015-06-25 NOTE — Telephone Encounter (Signed)
[facility] is calling from [place] and is requesting speech therapy for the patient because of the difficulty swallowing. Please call him with verbal orders. jw

## 2015-06-26 NOTE — Telephone Encounter (Signed)
Spoke with Frances Medina and he is aware that he needs to contact neurology office regarding medication concerns. Frances Medina,CMA

## 2015-06-27 ENCOUNTER — Ambulatory Visit (HOSPITAL_COMMUNITY): Payer: BC Managed Care – PPO | Admitting: Psychiatry

## 2015-06-27 ENCOUNTER — Ambulatory Visit (HOSPITAL_COMMUNITY): Payer: BC Managed Care – PPO

## 2015-06-27 ENCOUNTER — Emergency Department (HOSPITAL_COMMUNITY)
Admission: EM | Admit: 2015-06-27 | Discharge: 2015-06-27 | Disposition: A | Discharge status: Home / Self Care | Payer: BC Managed Care – PPO | Attending: Emergency Medicine | Admitting: Emergency Medicine

## 2015-06-27 ENCOUNTER — Other Ambulatory Visit (HOSPITAL_COMMUNITY): Payer: Self-pay | Admitting: Family Medicine

## 2015-06-27 ENCOUNTER — Encounter (HOSPITAL_COMMUNITY): Payer: Self-pay | Admitting: Emergency Medicine

## 2015-06-27 DIAGNOSIS — Z87440 Personal history of urinary (tract) infections: Secondary | ICD-10-CM | POA: Diagnosis not present

## 2015-06-27 DIAGNOSIS — Z8619 Personal history of other infectious and parasitic diseases: Secondary | ICD-10-CM | POA: Insufficient documentation

## 2015-06-27 DIAGNOSIS — Z87828 Personal history of other (healed) physical injury and trauma: Secondary | ICD-10-CM | POA: Diagnosis not present

## 2015-06-27 DIAGNOSIS — R1310 Dysphagia, unspecified: Secondary | ICD-10-CM | POA: Diagnosis not present

## 2015-06-27 DIAGNOSIS — Z86018 Personal history of other benign neoplasm: Secondary | ICD-10-CM | POA: Diagnosis not present

## 2015-06-27 DIAGNOSIS — K219 Gastro-esophageal reflux disease without esophagitis: Secondary | ICD-10-CM | POA: Diagnosis not present

## 2015-06-27 DIAGNOSIS — F329 Major depressive disorder, single episode, unspecified: Secondary | ICD-10-CM | POA: Diagnosis not present

## 2015-06-27 DIAGNOSIS — Z87891 Personal history of nicotine dependence: Secondary | ICD-10-CM | POA: Insufficient documentation

## 2015-06-27 DIAGNOSIS — E663 Overweight: Secondary | ICD-10-CM | POA: Insufficient documentation

## 2015-06-27 DIAGNOSIS — F419 Anxiety disorder, unspecified: Secondary | ICD-10-CM | POA: Insufficient documentation

## 2015-06-27 DIAGNOSIS — M179 Osteoarthritis of knee, unspecified: Secondary | ICD-10-CM | POA: Insufficient documentation

## 2015-06-27 DIAGNOSIS — I10 Essential (primary) hypertension: Secondary | ICD-10-CM | POA: Insufficient documentation

## 2015-06-27 DIAGNOSIS — Z79899 Other long term (current) drug therapy: Secondary | ICD-10-CM | POA: Insufficient documentation

## 2015-06-27 NOTE — Addendum Note (Signed)
Addended by: Andrena Mews T on: 06/27/2015 10:41 AM   Modules accepted: Orders

## 2015-06-27 NOTE — ED Provider Notes (Signed)
CSN: [id number]     Arrival date & time 06/27/15  [id number] History   First MD Initiated Contact with Patient 06/27/15 [phone]     Chief Complaint  Patient presents with  . Dysphagia     (Consider location/radiation/quality/duration/timing/severity/associated sxs/prior Treatment) HPI   Frances Medina is a 55 y.o. female who presents for evaluation of an episode of difficulty swallowing her pills this morning. After working and swallowing water, she was able to get the pills down, and now feels back to normal. This is a recurrent problem, ongoing since her diagnosis of myasthenia gravis earlier this year. Currently on a prednisone taper, and is having trouble swallowing, the daily 4 pills of prednisone, which she takes about once. She denies shortness of breath, cough, chest pain, or dizziness. She has chronic hoarseness secondary to her recent tracheostomy, which was removed, about 20 days ago. She has a follow-up appointment in the morning, tomorrow, with her neurologist. There are no other known modifying factors.   Past Medical History  Diagnosis Date  . GERD (gastroesophageal reflux disease)   . 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 Knee injury   . Depression   . Seasonal allergies     takes Zytrec  . Fibroid uterus     size of a dime  . Umbilical hernia     watching , no plans for surgery at present  . H/O hiatal hernia   . Hypertension     "went away when I stopped smoking"  . Anxiety   . Migraine     "maybe couple times/month" (03/20/2015)  . Arthritis     "knees" (03/20/2015)  . Osteoarthritis of right knee 08/30/2013  . Osteoarthritis of left knee 12/02/2013  . Myasthenia gravis ([place]) 2017  . E. coli UTI 04/07/2015   Past Surgical History  Procedure Laterality Date  . Cesarean section  1987; 1989  . Dilation and curettage of uterus    . Tubal ligation  1989  . Vaginal hysterectomy  1990's?    "apparently took out one of my ovaries at the time too cause one's missing"  . Partial  knee arthroplasty Right 08/30/2013    Procedure: RIGHT UNICOMPARTMENTAL KNEE;  Surgeon: Johnny Bridge, MD;  Location: [facility];  Service: Orthopedics;  Laterality: Right;  . Colonoscopy with propofol N/A 09/19/2013    Procedure: COLONOSCOPY WITH PROPOFOL;  Surgeon: Ladene Artist, MD;  Location: [facility];  Service: Endoscopy;  Laterality: N/A;  . Partial knee arthroplasty Left 12/02/2013    Procedure: LEFT KNEE UNI ARTHROPLASTY;  Surgeon: Johnny Bridge, MD;  Location: [facility];  Service: Orthopedics;  Laterality: Left;  . Esophagogastroduodenoscopy (egd) with propofol N/A 03/21/2015    Procedure: ESOPHAGOGASTRODUODENOSCOPY (EGD) WITH PROPOFOL;  Surgeon: Gatha Mayer, MD;  Location: [facility];  Service: Endoscopy;  Laterality: N/A;   Family History  Problem Relation Age of Onset  . Heart disease Mother 46  . Hypertension Mother   . Stroke Father   . Hypertension Father   . Colon cancer Neg Hx   . Other Sister     Esophageal strictures  . Anemia Sister    Social History  Substance Use Topics  . Smoking status: Former Smoker -- 0.50 packs/day for 40 years    Types: Cigarettes    Quit date: 05/14/2012  . Smokeless tobacco: Never Used  . Alcohol Use: No   OB History    No data available  Review of Systems  All other systems reviewed and are negative.     Allergies  Dicyclomine; Methocarbamol; Penicillins; Ciprofloxacin; and Shrimp  Home Medications   Prior to Admission medications   Medication Sig Start Date End Date Taking? Authorizing Provider  albuterol (PROVENTIL HFA;VENTOLIN HFA) 108 (90 Base) MCG/ACT inhaler Inhale 2 puffs into the lungs every 4 (four) hours as needed for wheezing or shortness of breath. Patient not taking: Reported on 06/18/2015 04/07/15   Helane Gunther Joy, PA-C  Chlorphen-PE-Acetaminophen (NOREL AD) 4-10-325 MG TABS Take 1 tablet by mouth 2 (two) times daily as needed. Reported on 06/18/2015    Historical Provider, MD  clonazePAM  (KLONOPIN) 1 MG tablet Take 0.5-1 tablets (0.5-1 mg total) by mouth 2 (two) times daily as needed for anxiety. 06/12/15   [name], MD  estradiol (ESTRACE) 0.5 MG tablet Take 0.5 mg by mouth daily.    Historical Provider, MD  famotidine (PEPCID) 20 MG tablet Take 1 tablet (20 mg total) by mouth 2 (two) times daily. 06/08/15   Nicolette Bang, DO  ferrous sulfate 325 (65 FE) MG tablet Take 1 tablet (325 mg total) by mouth 2 (two) times daily with a meal. 04/07/15   Shawn C Joy, PA-C  pyridostigmine (MESTINON) 60 MG tablet Take 1 tablet (60 mg total) by mouth 4 (four) times daily. 06/08/15   Nicolette Bang, DO  sertraline (ZOLOFT) 100 MG tablet Take 50 mg by mouth every morning.     Historical Provider, MD  Vitamin D, Ergocalciferol, (DRISDOL) 50000 units CAPS capsule Take 1 capsule (50,000 Units total) by mouth every 7 (seven) days. 05/02/15   [name], MD   BP 121/91 mmHg  Pulse 77  Temp(Src) 97.5 F (36.4 C) (Oral)  Resp 18  SpO2 95% Physical Exam  Constitutional: She is oriented to person, place, and time. She appears well-developed.  Overweight  HENT:  Head: Normocephalic and atraumatic.  Right Ear: External ear normal.  Left Ear: External ear normal.  Well-healed lower anterior neck scar consistent with recent tracheostomy removal. No associated swelling or deformity.  Eyes: Conjunctivae and EOM are normal. Pupils are equal, round, and reactive to light.  Neck: Normal range of motion and phonation normal. Neck supple.  Cardiovascular: Normal rate, regular rhythm and normal heart sounds.   Pulmonary/Chest: Effort normal and breath sounds normal. She exhibits no bony tenderness.  Abdominal: Soft. There is no tenderness.  Musculoskeletal: Normal range of motion.  Neurological: She is alert and oriented to person, place, and time. No cranial nerve deficit or sensory deficit. She exhibits normal muscle tone. Coordination normal.  Skin: Skin is warm, dry and  intact.  Psychiatric: She has a normal mood and affect. Her behavior is normal. Judgment and thought content normal.  Nursing note and vitals reviewed.   ED Course  Procedures (including critical care time)   Medications - No data to display  Patient Vitals for the past 24 hrs:  BP Temp Temp src Pulse Resp SpO2  06/27/15 1000 121/91 mmHg 97.5 F (36.4 C) Oral 77 18 95 %  06/27/15 0756 144/77 mmHg 97.5 F (36.4 C) Oral 91 18 95 %   Swollowing screen, and served by me, was passed without difficulty.  At discharge- Reevaluation with update and discussion. After initial assessment and treatment, an updated evaluation reveals she remains calm, comfortable and is able to swallow liquids without any pain or difficulty. Findings discussed with the patient and all questions were answered.Richarda Blade  Labs Review Labs Reviewed - No data to display  Imaging Review No results found. I have personally reviewed and evaluated these images and lab results as part of my medical decision-making.   EKG Interpretation None      MDM   Final diagnoses:  Dysphagia    Pill swallowing problem, likely related to myasthenia gravis. Of note, patient is taking her Mestinon, within a 14 hour timeframe, during the day. She is therefore having a prolonged time between the nighttime and the first morning dose, which is likely contributing to an increased amount of swallowing difficulty. Also, the prednisone, does not have to be taken, although once it can be spread out during the day. Doubt serous bacterial infection. Metabolic instability or impending vascular collapse.  Nursing Notes Reviewed/ Care Coordinated Applicable Imaging Reviewed Interpretation of Laboratory Data incorporated into ED treatment  The patient appears reasonably screened and/or stabilized for discharge and I doubt any other medical condition or other [facility] requiring further screening, evaluation, or treatment in the ED  at this time prior to discharge.  Plan: Home Medications- take Mestinon, every 6 hours. Use prednisone 4 times a day, instead of daily.; Home Treatments- on his drink a lot of water when swallowing pills; return here if the recommended treatment, does not improve the symptoms; Recommended follow up- follow up with neurology tomorrow morning as scheduled.     Daleen Bo, MD 06/27/15 1051

## 2015-06-27 NOTE — ED Notes (Signed)
Pt sts hx of MG and having flare; pt sts pill was stuck in throat this am but now gone but having trouble swallowing; no distress noted

## 2015-06-27 NOTE — Addendum Note (Signed)
Addended by: Andrena Mews T on: 06/27/2015 10:58 AM   Modules accepted: Orders

## 2015-06-27 NOTE — Discharge Instructions (Signed)
Take your Mestinon every 6 hours, throughout the day.  It is okay to take the prednisone 4 times a day.

## 2015-06-29 ENCOUNTER — Telehealth: Payer: Self-pay | Admitting: Family Medicine

## 2015-06-29 ENCOUNTER — Ambulatory Visit (HOSPITAL_COMMUNITY)
Admission: RE | Admit: 2015-06-29 | Discharge: 2015-06-29 | Disposition: A | Discharge status: Home / Self Care | Payer: BC Managed Care – PPO | Source: Ambulatory Visit | Attending: Family Medicine | Admitting: Family Medicine

## 2015-06-29 DIAGNOSIS — R1310 Dysphagia, unspecified: Secondary | ICD-10-CM

## 2015-06-29 DIAGNOSIS — G7000 Myasthenia gravis without (acute) exacerbation: Secondary | ICD-10-CM | POA: Diagnosis not present

## 2015-06-29 NOTE — Telephone Encounter (Signed)
Speech Therapist with [facility] is requesting 3 therapy visits. It can be verbal  Also pt saw the neurologist on on Tuesday. Kim requested a report to be faxed to [facility] at [phone]

## 2015-06-29 NOTE — Telephone Encounter (Signed)
I don't have [facility]'s number. Please give verbal order from me for speech therapy.

## 2015-06-29 NOTE — Telephone Encounter (Signed)
[facility] neurological care. Palmina Clodfelter, [name], CMA

## 2015-06-29 NOTE — Telephone Encounter (Signed)
Verbal order given. Spoke with Frances Medina, kim should contact neurology to get notes quicker.  Pt will let me know whom her neurologist is as it is not in epic. Frances Medina, [name], CMA

## 2015-07-02 ENCOUNTER — Telehealth: Payer: Self-pay | Admitting: Family Medicine

## 2015-07-02 NOTE — Telephone Encounter (Signed)
lmovm for Frances Medina to return call.  Please inform that per Dr. Gwendlyn Deutscher, Frances Medina should contact [facility] neurological care for notes as needed, as we will not receive them regularly. [name], [name], CMA

## 2015-07-02 NOTE — Telephone Encounter (Signed)
Kim called from [facility] and would like copies of the patients Barium swallow and the neurologist report once she has that appointment faxed to her at [phone] attention [facility]. jw

## 2015-07-03 ENCOUNTER — Ambulatory Visit: Payer: BC Managed Care – PPO

## 2015-07-03 NOTE — Telephone Encounter (Signed)
Spoke with patient and she is fine with me faxing the report to [facility] and she will contact her neurologist to get them to fax their notes over also. Jazmin Hartsell,CMA

## 2015-07-03 NOTE — Telephone Encounter (Signed)
LM for patient to call back.  I have already printed the copy of her barium swallow report but want to make sure she is ok with me sending this to [facility].  Also I need her to contact her neurologist and have them fax over their most recent note to her also. Juanluis Guastella,CMA

## 2015-07-19 ENCOUNTER — Encounter: Payer: Self-pay | Admitting: Family Medicine

## 2015-07-19 NOTE — Progress Notes (Signed)
Patient ID: Frances Medina, female   DOB: [birth year], 55 y.o.   MRN: [id number] Patient's daughter informed me that [facility] speech therapy has been discontinued. I also got a letter from [facility] that patient has been discharged from their service since she has met the expected goal. As discussed with patient's daughter, since I recently sent a speech pathology referral, she can call [facility] office to request follow up appointment. She was not able to schedule an appointment previously because she was receiving similar service from [facility]. Now that she has been discharged from [facility], she can call back for routine follow up and swallow exercise. Her daughter stated they will call this week for appointment.

## 2015-07-31 ENCOUNTER — Ambulatory Visit: Payer: BC Managed Care – PPO | Attending: Family Medicine | Admitting: Speech Pathology

## 2015-07-31 DIAGNOSIS — R1312 Dysphagia, oropharyngeal phase: Secondary | ICD-10-CM | POA: Diagnosis not present

## 2015-07-31 NOTE — Therapy (Signed)
[facility] [address] [place], [place], [address] Phone: [phone]   Fax:  [phone]  Speech Language Pathology Evaluation  Patient Details  Name: Frances Medina MRN: [id number] Date of Birth: [birth year] Referring Provider: Dr. Andrena Mews  Encounter Date: 07/31/2015      End of Session - 07/31/15 1030    Visit Number 1   Number of Visits 10   Date for SLP Re-Evaluation 09/04/15   SLP Start Time 0934   SLP Stop Time  1017   SLP Time Calculation (min) 43 min      Past Medical History  Diagnosis Date  . GERD (gastroesophageal reflux disease)   . 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 Knee injury   . Depression   . Seasonal allergies     takes Zytrec  . Fibroid uterus     size of a dime  . Umbilical hernia     watching , no plans for surgery at present  . H/O hiatal hernia   . Hypertension     "went away when I stopped smoking"  . Anxiety   . Migraine     "maybe couple times/month" (03/20/2015)  . Arthritis     "knees" (03/20/2015)  . Osteoarthritis of right knee 08/30/2013  . Osteoarthritis of left knee 12/02/2013  . Myasthenia gravis ([place]) 2017  . E. coli UTI 04/07/2015    Past Surgical History  Procedure Laterality Date  . Cesarean section  1987; 1989  . Dilation and curettage of uterus    . Tubal ligation  1989  . Vaginal hysterectomy  1990's?    "apparently took out one of my ovaries at the time too cause one's missing"  . Partial knee arthroplasty Right 08/30/2013    Procedure: RIGHT UNICOMPARTMENTAL KNEE;  Surgeon: Johnny Bridge, MD;  Location: [facility];  Service: Orthopedics;  Laterality: Right;  . Colonoscopy with propofol N/A 09/19/2013    Procedure: COLONOSCOPY WITH PROPOFOL;  Surgeon: Ladene Artist, MD;  [facility];  Service: Endoscopy;  Laterality: N/A;  . Partial knee arthroplasty Left 12/02/2013    Procedure: LEFT KNEE UNI ARTHROPLASTY;  Surgeon: Johnny Bridge, MD;   Location: [facility];  Service: Orthopedics;  Laterality: Left;  . Esophagogastroduodenoscopy (egd) with propofol N/A 03/21/2015    Procedure: ESOPHAGOGASTRODUODENOSCOPY (EGD) WITH PROPOFOL;  Surgeon: Gatha Mayer, MD;  Location: [facility];  Service: Endoscopy;  Laterality: N/A;    There were no vitals filed for this visit.      Subjective Assessment - 07/31/15 0942    Subjective "When I swallow, my left side gets weak"   Patient is accompained by: Family member   Currently in Pain? No/denies            SLP Evaluation [facility] - 07/31/15 [id number]    SLP Visit Information   SLP Received On 07/31/15   Referring Provider Dr. Andrena Mews   Onset Date 03/2015 Dx with Myasthenia Gravis   Medical Diagnosis Myasthenia Gravis with dysphagia   Subjective   Patient/Family Stated Goal "I want to be able to talk right   General Information   HPI 55 yo female known to SLP service due tp previous admissions for Myasthenia gravis with associated dysphagia. Most recent admission required tracheostomy, although she has since been decannulated. Pt d/c home on nectar thick liquids and was initially tolerating her diet well, but developed difficulty swallowing 4/21. She was seen in the  ED for this but d/c home with f/u appointment scheduled with her neurologist the next date. She presents for OP MBS to assess current swallowing function.   Behavioral/Cognition reports recent short term memory decline   Mobility Status walks with a cane, denies falls   Prior Functional Status   Cognitive/Linguistic Baseline Within functional limits   Type of Home House    Lives With Spouse;Family;Daughter   Available Support Family;Friend(s)   Vocation On disability   Oral Motor/Sensory Function   Overall Oral Motor/Sensory Function Impaired   Labial ROM Within Functional Limits   Labial Strength Reduced   Labial Sensation Reduced   Lingual Strength Reduced   Lingual Sensation Reduced   Motor  Speech   Overall Motor Speech Impaired   Respiration Within functional limits   Phonation Aphonic;Breathy;Hoarse   Resonance Within functional limits   Intelligibility Intelligibility reduced   Conversation 75-100% accurate                 Nectar thick liquid - 07/31/15 1029    Nectar Thick Liquid   Nectar Thick Liquid Impaired   Pharyngeal Phase Impairments Decreased hyoid-laryngeal movement;Multiple swallows;Throat Clearing - Delayed                SLP Education - 07/31/15 1045    Education provided Yes   Education Details swallow precautions, diet modifications, energy conversation while maximizing nutrition, s/s of apsiration pna   Person(s) Educated Patient;Other (comment)  friend Frances Medina   Methods Explanation;Demonstration;Handout;Verbal cues   Comprehension Verbal cues required;Need further instruction            SLP Long Term Goals - 07/31/15 1039    SLP LONG TERM GOAL #1   Title Pt will follow diet modifications and swallow precautions with rare min A over 3 sessions.   Time 4   Period Weeks   Status New   SLP LONG TERM GOAL #2   Title Pt will verbalize s/s of aspiration pna with rare min A   Time 4   Period Weeks   Status New   SLP LONG TERM GOAL #3   Title Pt will verbalize energy conservation strategies for maximized PO intake and report following these at home over 2 sessions.    Time 4   Period Weeks   Status New          Plan - 07/31/15 1030    Clinical Impression Statement Frances Medina, a 55 year old female diagnosed with Myasthenia Gravis 03/2015. She has a multiple hospitalization, most recent requiring intubation. MBSS on 06/28/15 revealed mild oral and severe pharyngeal dysphagia. Aspiration of thin liquids and severe pharyngeal residue with solids due to pharyngeal weakness. Pharayngeal weakness increased as MBSS progressed due to fatigue. Puree solids and nectar thick liquids with chin tuck, multiple swallows and throat clears.  Pt arrived today with a bottle of water she thickened, I instructed her that it was not thick enough. Pt also presents with severe dysphonia, weak cough and throat clear indicating poor abilit to clear aspiration.Pt required verbal cues to follow her swallow precautions, stating "I didn't tuck my chin because it went down ok." I recommend skilled ST  for education re: proper diet modifications, follow swallow precautions accurately and for energy conservation during meals due to muscle fatigue with Myasthenia Gravis.  I introduced education re: easy to swallow, high nutrition/calorie foods and encouraged pt to look up smoothie type recipes. I also introduced possiblity of feeding tube in the future to supplement  PO nutrition, as pt reports she has lost 50 pounds since January.     Speech Therapy Frequency 2x / week   Duration 4 weeks   Potential to Achieve Goals Fair   Potential Considerations Medical prognosis;Severity of impairments   Consulted and Agree with Plan of Care Patient;Family member/caregiver   Family Member Consulted friend, Frances Medina      Patient will benefit from skilled therapeutic intervention in order to improve the following deficits and impairments:   Dysphagia, oropharyngeal phase    Problem List Patient Active Problem List   Diagnosis Date Noted  . Tracheostomy status ([place])   . Endotracheally intubated   . VAP (ventilator-associated pneumonia) ([place])   . Acute respiratory failure with hypoxemia ([place])   . Depression   . Headache, migraine   . Tachypnea   . Enteritis due to Clostridium difficile   . Leukocytosis   . Presence of tracheostomy ([place])   . Acute respiratory failure ([place])   . MG (myasthenia gravis) ([place]) 05/16/2015  . Myasthenia exacerbation ([place])   . Difficult intravenous access   . HTN (hypertension), benign   . Myasthenia gravis with acute exacerbation ([place]) 05/15/2015  . Shortness of breath   . Diplopia   . Dysphagia, neurologic   . Elevated LDH  05/06/2015  . Elevated BP 05/01/2015  . Fatigue 05/01/2015  . Chest wall pain 04/08/2015  . Dyspnea 04/07/2015  . Hypokalemia 04/07/2015  . Anemia 04/07/2015  . Neutropenia ([place]) 04/07/2015  . Lymphadenopathy 04/07/2015  . Splenomegaly 04/07/2015  . Myasthenia gravis ([place])   . Dysphagia   . Inability to swallow 03/20/2015  . Osteoarthritis of left knee 12/02/2013  . Primary localized osteoarthrosis of the knee 12/02/2013  . Benign neoplasm of colon 09/19/2013  . Osteoarthritis of right knee 08/30/2013  . GERD (gastroesophageal reflux disease) 06/23/2013  . Obesity 06/23/2013  . Anxiety and depression 06/23/2013  . Fibroids 06/23/2013    Lovvorn, Annye Rusk MS, CCC-SLP 07/31/2015, 10:47 AM  [facility] [address] [place], [place], [address] Phone: [phone]   Fax:  [phone]  Name: Frances Medina MRN: [id number] Date of Birth: Jul 17, 1960

## 2015-07-31 NOTE — Patient Instructions (Signed)
   Signs of Aspiration Pneumonia   . Chest pain/tightness . Fever (can be low grade) . Cough  o With foul-smelling phlegm (sputum) o With sputum containing pus or blood o With greenish sputum . Fatigue  . Shortness of breath  . Wheezing   **IF YOU HAVE THESE SIGNS, CONTACT YOUR DOCTOR OR GO TO THE EMERGENCY DEPARTMENT OR URGENT CARE AS SOON AS POSSIBLE**    Swallowing Precautions  Small bites/sips  Chin tuck  Multiple swallows  Reduce distractions during meals - limit talking  No Straws  Puree solids  Nectar thick liquids - thicken all liquids to a nectar consistency  Small, Frequent meals when you have most energy   Protein Powder, Making sure all smoothies, drinks are nectar  Kefir, yogurt smoothies

## 2015-08-03 ENCOUNTER — Ambulatory Visit: Payer: BC Managed Care – PPO | Attending: Family Medicine

## 2015-08-03 DIAGNOSIS — R1310 Dysphagia, unspecified: Secondary | ICD-10-CM | POA: Insufficient documentation

## 2015-08-03 NOTE — Therapy (Signed)
[facility] [address] [place], [place], [address] Phone: [phone]   Fax:  [phone]  Speech Language Pathology Treatment  Patient Details  Name: Frances Medina MRN: [id number] Date of Birth: [birth year] Referring Provider: Dr. Andrena Mews  Encounter Date: 08/03/2015      End of Session - 08/03/15 1500    Visit Number 2   Number of Visits 10   Date for SLP Re-Evaluation 09/04/15   SLP Start Time 1405   SLP Stop Time  1447   SLP Time Calculation (min) 42 min   Activity Tolerance Patient tolerated treatment well      Past Medical History  Diagnosis Date  . GERD (gastroesophageal reflux disease)   . 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 Knee injury   . Depression   . Seasonal allergies     takes Zytrec  . Fibroid uterus     size of a dime  . Umbilical hernia     watching , no plans for surgery at present  . H/O hiatal hernia   . Hypertension     "went away when I stopped smoking"  . Anxiety   . Migraine     "maybe couple times/month" (03/20/2015)  . Arthritis     "knees" (03/20/2015)  . Osteoarthritis of right knee 08/30/2013  . Osteoarthritis of left knee 12/02/2013  . Myasthenia gravis ([place]) 2017  . E. coli UTI 04/07/2015    Past Surgical History  Procedure Laterality Date  . Cesarean section  1987; 1989  . Dilation and curettage of uterus    . Tubal ligation  1989  . Vaginal hysterectomy  1990's?    "apparently took out one of my ovaries at the time too cause one's missing"  . Partial knee arthroplasty Right 08/30/2013    Procedure: RIGHT UNICOMPARTMENTAL KNEE;  Surgeon: Johnny Bridge, MD;  Location: [facility];  Service: Orthopedics;  Laterality: Right;  . Colonoscopy with propofol N/A 09/19/2013    Procedure: COLONOSCOPY WITH PROPOFOL;  Surgeon: Ladene Artist, MD;  Location: [facility];  Service: Endoscopy;  Laterality: N/A;  . Partial knee arthroplasty Left 12/02/2013    Procedure: LEFT KNEE UNI  ARTHROPLASTY;  Surgeon: Johnny Bridge, MD;  Location: [facility];  Service: Orthopedics;  Laterality: Left;  . Esophagogastroduodenoscopy (egd) with propofol N/A 03/21/2015    Procedure: ESOPHAGOGASTRODUODENOSCOPY (EGD) WITH PROPOFOL;  Surgeon: Gatha Mayer, MD;  Location: [facility];  Service: Endoscopy;  Laterality: N/A;    There were no vitals filed for this visit.             ADULT SLP TREATMENT - 08/03/15 1505    General Information   Behavior/Cognition Alert;Cooperative;Pleasant mood   Treatment Provided   Treatment provided Dysphagia   Dysphagia Treatment   Temperature Spikes Noted No   Respiratory Status Room air   Treatment Methods Skilled observation;Compensation strategy training;Patient/caregiver education   Patient observed directly with PO's Yes   Type of PO's observed Dysphagia 1 (puree);Nectar-thick liquids   Liquids provided via Cup   Other treatment/comments SLP req'd to cue pt to use all of her precautions at various times during PO practice. SLP provided pt with hadnout for precautions. SLP educated pt re: s/s/aspiration PNA, as well as energy conservation during meals (no chewed items, no talking, make sure to sit while eating). SLP explained to pt that Boost was not nectar, and she would need to add thickener to approximate  a buttermilk/nectar consistency. Pt coughing today, clear phlegn - SLP saw pt's coughed contents twice -both were without applesauce residual.    Assessment / Recommendations / Plan   Plan Continue with current plan of care   Dysphagia Recommendations   Diet recommendations Dysphagia 1 (puree);Nectar-thick liquid   Medication Administration Whole meds with puree          SLP Education - 08/03/15 1459    Education provided Yes   Education Details swallow precautions, energy conservation, s/s aspiration PNA   Person(s) Educated Patient   Methods Explanation;Demonstration;Verbal cues;Handout   Comprehension  Verbalized understanding;Returned demonstration;Need further instruction            SLP Long Term Goals - 08/03/15 1504    SLP LONG TERM GOAL #1   Title Pt will follow diet modifications and swallow precautions with rare min A over 3 sessions.   Time 4   Period Weeks   Status On-going   SLP LONG TERM GOAL #2   Title Pt will verbalize s/s of aspiration pna with rare min A   Time 4   Period Weeks   Status On-going   SLP LONG TERM GOAL #3   Title Pt will verbalize energy conservation strategies for maximized PO intake and report following these at home over 2 sessions.    Time 4   Period Weeks   Status On-going          Plan - 08/03/15 1501    Clinical Impression Statement Pt continues with dysphagia and states she is eating dys I with Boost. SLP educated pt she must thicken Boost slightly to bring it to nectar-thick. Pt req'd cues for swallow precautions, following these 40-50% of the time without cues, and cues for thickening liquids to nectar. SLP encouraged pt to consider [facility]; pt stated she was going to talk to her MD on Monday about it.   Speech Therapy Frequency 2x / week   Duration 4 weeks   Treatment/Interventions Aspiration precaution training;SLP instruction and feedback;Compensatory strategies;Environmental controls;Patient/family education;Diet toleration management by SLP   Potential to Achieve Goals Fair   Potential Considerations Medical prognosis;Severity of impairments      Patient will benefit from skilled therapeutic intervention in order to improve the following deficits and impairments:   Dysphagia    Problem List Patient Active Problem List   Diagnosis Date Noted  . Tracheostomy status ([place])   . Endotracheally intubated   . VAP (ventilator-associated pneumonia) ([place])   . Acute respiratory failure with hypoxemia ([place])   . Depression   . Headache, migraine   . Tachypnea   . Enteritis due to Clostridium difficile   .  Leukocytosis   . 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 Acute respiratory failure ([place])   . MG (myasthenia gravis) ([place]) 05/16/2015  . Myasthenia exacerbation ([place])   . Difficult intravenous access   . HTN (hypertension), benign   . Myasthenia gravis with acute exacerbation ([place]) 05/15/2015  . Shortness of breath   . Diplopia   . Dysphagia, neurologic   . Elevated LDH 05/06/2015  . Elevated BP 05/01/2015  . Fatigue 05/01/2015  . Chest wall pain 04/08/2015  . Dyspnea 04/07/2015  . Hypokalemia 04/07/2015  . Anemia 04/07/2015  . Neutropenia ([place]) 04/07/2015  . Lymphadenopathy 04/07/2015  . Splenomegaly 04/07/2015  . Myasthenia gravis ([place])   . Dysphagia   . Inability to swallow 03/20/2015  . Osteoarthritis of left knee 12/02/2013  . Primary localized osteoarthrosis of the knee  12/02/2013  . Benign neoplasm of colon 09/19/2013  . Osteoarthritis of right knee 08/30/2013  . GERD (gastroesophageal reflux disease) 06/23/2013  . Obesity 06/23/2013  . Anxiety and depression 06/23/2013  . Fibroids 06/23/2013    SCHINKE,CARL ,MS, CCC-SLP  08/03/2015, 3:13 PM  [facility] [address] [place], [place], [address] Phone: [phone]   Fax:  [phone]   Name: EMILYAH CRAWMER MRN: [id number] Date of Birth: 1960-12-16

## 2015-08-03 NOTE — Patient Instructions (Addendum)
    WITH ALL YOUR MEALS and SNACKS:   TUCK YOUR CHIN   SWALLOW 2-3 TIMES   CLEAR YOUR THROAT   =============================================  Signs of Aspiration Pneumonia   . Chest pain/tightness . Fever (can be low grade) . Cough  o With foul-smelling phlegm (sputum) o With sputum containing pus or blood o With greenish sputum . Fatigue  . Shortness of breath  . Wheezing   **IF YOU HAVE THESE SIGNS, CONTACT YOUR DOCTOR OR GO TO THE EMERGENCY DEPARTMENT OR URGENT CARE AS SOON AS POSSIBLE**

## 2015-08-05 ENCOUNTER — Encounter (HOSPITAL_COMMUNITY): Payer: Self-pay

## 2015-08-05 ENCOUNTER — Inpatient Hospital Stay (HOSPITAL_COMMUNITY)
Admission: EM | Admit: 2015-08-05 | Discharge: 2015-08-11 | DRG: 178 | Disposition: A | Discharge status: Home Health | Payer: BC Managed Care – PPO | Attending: Family Medicine | Admitting: Family Medicine

## 2015-08-05 ENCOUNTER — Emergency Department (HOSPITAL_COMMUNITY): Payer: BC Managed Care – PPO

## 2015-08-05 DIAGNOSIS — G7000 Myasthenia gravis without (acute) exacerbation: Secondary | ICD-10-CM | POA: Diagnosis not present

## 2015-08-05 DIAGNOSIS — J9601 Acute respiratory failure with hypoxia: Secondary | ICD-10-CM | POA: Insufficient documentation

## 2015-08-05 DIAGNOSIS — T17908A Unspecified foreign body in respiratory tract, part unspecified causing other injury, initial encounter: Secondary | ICD-10-CM | POA: Insufficient documentation

## 2015-08-05 DIAGNOSIS — R161 Splenomegaly, not elsewhere classified: Secondary | ICD-10-CM | POA: Diagnosis present

## 2015-08-05 DIAGNOSIS — R1310 Dysphagia, unspecified: Secondary | ICD-10-CM

## 2015-08-05 DIAGNOSIS — Z880 Allergy status to penicillin: Secondary | ICD-10-CM

## 2015-08-05 DIAGNOSIS — K219 Gastro-esophageal reflux disease without esophagitis: Secondary | ICD-10-CM | POA: Diagnosis present

## 2015-08-05 DIAGNOSIS — D592 Drug-induced nonautoimmune hemolytic anemia: Secondary | ICD-10-CM | POA: Diagnosis present

## 2015-08-05 DIAGNOSIS — R633 Feeding difficulties: Secondary | ICD-10-CM | POA: Insufficient documentation

## 2015-08-05 DIAGNOSIS — R1313 Dysphagia, pharyngeal phase: Secondary | ICD-10-CM | POA: Diagnosis present

## 2015-08-05 DIAGNOSIS — T50995A Adverse effect of other drugs, medicaments and biological substances, initial encounter: Secondary | ICD-10-CM | POA: Diagnosis present

## 2015-08-05 DIAGNOSIS — Z79899 Other long term (current) drug therapy: Secondary | ICD-10-CM

## 2015-08-05 DIAGNOSIS — Z87891 Personal history of nicotine dependence: Secondary | ICD-10-CM

## 2015-08-05 DIAGNOSIS — J690 Pneumonitis due to inhalation of food and vomit: Principal | ICD-10-CM | POA: Diagnosis present

## 2015-08-05 DIAGNOSIS — F329 Major depressive disorder, single episode, unspecified: Secondary | ICD-10-CM | POA: Diagnosis present

## 2015-08-05 DIAGNOSIS — R6339 Other feeding difficulties: Secondary | ICD-10-CM | POA: Insufficient documentation

## 2015-08-05 DIAGNOSIS — Z888 Allergy status to other drugs, medicaments and biological substances status: Secondary | ICD-10-CM

## 2015-08-05 DIAGNOSIS — K224 Dyskinesia of esophagus: Secondary | ICD-10-CM | POA: Diagnosis present

## 2015-08-05 DIAGNOSIS — J209 Acute bronchitis, unspecified: Secondary | ICD-10-CM | POA: Diagnosis present

## 2015-08-05 DIAGNOSIS — J9811 Atelectasis: Secondary | ICD-10-CM | POA: Diagnosis present

## 2015-08-05 DIAGNOSIS — J189 Pneumonia, unspecified organism: Secondary | ICD-10-CM

## 2015-08-05 DIAGNOSIS — Z91013 Allergy to seafood: Secondary | ICD-10-CM

## 2015-08-05 DIAGNOSIS — J441 Chronic obstructive pulmonary disease with (acute) exacerbation: Secondary | ICD-10-CM | POA: Diagnosis present

## 2015-08-05 DIAGNOSIS — E43 Unspecified severe protein-calorie malnutrition: Secondary | ICD-10-CM | POA: Diagnosis not present

## 2015-08-05 DIAGNOSIS — E876 Hypokalemia: Secondary | ICD-10-CM | POA: Diagnosis present

## 2015-08-05 DIAGNOSIS — K429 Umbilical hernia without obstruction or gangrene: Secondary | ICD-10-CM | POA: Diagnosis present

## 2015-08-05 DIAGNOSIS — Z6841 Body Mass Index (BMI) 40.0 and over, adult: Secondary | ICD-10-CM

## 2015-08-05 DIAGNOSIS — R591 Generalized enlarged lymph nodes: Secondary | ICD-10-CM | POA: Diagnosis present

## 2015-08-05 DIAGNOSIS — Z9981 Dependence on supplemental oxygen: Secondary | ICD-10-CM

## 2015-08-05 DIAGNOSIS — Z881 Allergy status to other antibiotic agents status: Secondary | ICD-10-CM

## 2015-08-05 DIAGNOSIS — F419 Anxiety disorder, unspecified: Secondary | ICD-10-CM | POA: Diagnosis present

## 2015-08-05 DIAGNOSIS — I5032 Chronic diastolic (congestive) heart failure: Secondary | ICD-10-CM | POA: Diagnosis present

## 2015-08-05 DIAGNOSIS — I110 Hypertensive heart disease with heart failure: Secondary | ICD-10-CM | POA: Diagnosis present

## 2015-08-05 DIAGNOSIS — Z90710 Acquired absence of both cervix and uterus: Secondary | ICD-10-CM

## 2015-08-05 DIAGNOSIS — J302 Other seasonal allergic rhinitis: Secondary | ICD-10-CM | POA: Diagnosis present

## 2015-08-05 DIAGNOSIS — T17998A Other foreign object in respiratory tract, part unspecified causing other injury, initial encounter: Secondary | ICD-10-CM | POA: Diagnosis not present

## 2015-08-05 DIAGNOSIS — E669 Obesity, unspecified: Secondary | ICD-10-CM | POA: Diagnosis not present

## 2015-08-05 DIAGNOSIS — J40 Bronchitis, not specified as acute or chronic: Secondary | ICD-10-CM | POA: Diagnosis not present

## 2015-08-05 HISTORY — DX: Other specified anxiety disorders: F41.8

## 2015-08-05 HISTORY — DX: Pneumonia, unspecified organism: J18.9

## 2015-08-05 HISTORY — DX: Dysphagia, unspecified: R13.10

## 2015-08-05 LAB — COMPREHENSIVE METABOLIC PANEL
ALT: 23 U/L (ref 14–54)
AST: 22 U/L (ref 15–41)
Albumin: 3.5 g/dL (ref 3.5–5.0)
Alkaline Phosphatase: 76 U/L (ref 38–126)
Anion gap: 8 (ref 5–15)
BUN: 9 mg/dL (ref 6–20)
CO2: 28 mmol/L (ref 22–32)
Calcium: 9 mg/dL (ref 8.9–10.3)
Chloride: 102 mmol/L (ref 101–111)
Creatinine, Ser: 0.68 mg/dL (ref 0.44–1.00)
GFR calc Af Amer: >60 mL/min (ref >60)
GFR calc non Af Amer: >60 mL/min (ref >60)
Glucose, Bld: 132 mg/dL — ABNORMAL HIGH (ref 65–99)
Potassium: 3.7 mmol/L (ref 3.5–5.1)
Sodium: 138 mmol/L (ref 135–145)
Total Bilirubin: 0.4 mg/dL (ref 0.3–1.2)
Total Protein: 6.6 g/dL (ref 6.5–8.1)

## 2015-08-05 LAB — CBC WITH DIFFERENTIAL/PLATELET
Basophils Absolute: 0 10*3/uL (ref 0.0–0.1)
Basophils Relative: 0 %
Eosinophils Absolute: 0 10*3/uL (ref 0.0–0.7)
Eosinophils Relative: 0 %
HCT: 42.8 % (ref 36.0–46.0)
Hemoglobin: 13.1 g/dL (ref 12.0–15.0)
Lymphocytes Relative: 4 %
Lymphs Abs: 0.4 10*3/uL — ABNORMAL LOW (ref 0.7–4.0)
MCH: 23.6 pg — ABNORMAL LOW (ref 26.0–34.0)
MCHC: 30.6 g/dL (ref 30.0–36.0)
MCV: 77.3 fL — ABNORMAL LOW (ref 78.0–100.0)
Monocytes Absolute: 0.3 10*3/uL (ref 0.1–1.0)
Monocytes Relative: 3 %
Neutro Abs: 8.8 10*3/uL — ABNORMAL HIGH (ref 1.7–7.7)
Neutrophils Relative %: 93 %
Platelets: 273 10*3/uL (ref 150–400)
RBC: 5.54 MIL/uL — ABNORMAL HIGH (ref 3.87–5.11)
RDW: 17.2 % — ABNORMAL HIGH (ref 11.5–15.5)
WBC: 9.4 10*3/uL (ref 4.0–10.5)

## 2015-08-05 LAB — BRAIN NATRIURETIC PEPTIDE: B Natriuretic Peptide: 20.8 pg/mL (ref 0.0–100.0)

## 2015-08-05 MED ORDER — CLONAZEPAM 0.5 MG PO TABS
0.5000 mg | ORAL_TABLET | Freq: Two times a day (BID) | ORAL | Status: DC | PRN
  Administered 2015-08-05 – 2015-08-10 (×5): 1 mg via ORAL
  Filled 2015-08-05 (×5): qty 2

## 2015-08-05 MED ORDER — ALBUTEROL SULFATE (2.5 MG/3ML) 0.083% IN NEBU
5.0000 mg | INHALATION_SOLUTION | Freq: Once | RESPIRATORY_TRACT | Status: AC
  Administered 2015-08-05: 5 mg via RESPIRATORY_TRACT
  Filled 2015-08-05: qty 6

## 2015-08-05 MED ORDER — SODIUM CHLORIDE 0.9 % IV SOLN
INTRAVENOUS | Status: DC
Start: 2015-08-05 — End: 2015-08-06
  Administered 2015-08-05: 16:00:00 via INTRAVENOUS

## 2015-08-05 MED ORDER — AZITHROMYCIN 250 MG PO TABS: 250.0000 mg | ORAL_TABLET | Freq: Every day | ORAL | Status: DC

## 2015-08-05 MED ORDER — DEXTROSE 5 % IV SOLN
500.0000 mg | Freq: Once | INTRAVENOUS | Status: AC
  Administered 2015-08-05: 500 mg via INTRAVENOUS
  Filled 2015-08-05: qty 500

## 2015-08-05 MED ORDER — ACETAMINOPHEN 325 MG PO TABS
650.0000 mg | ORAL_TABLET | Freq: Four times a day (QID) | ORAL | Status: DC | PRN
  Administered 2015-08-06 – 2015-08-11 (×6): 650 mg via ORAL
  Filled 2015-08-05 (×6): qty 2

## 2015-08-05 MED ORDER — PYRIDOSTIGMINE BROMIDE ER 180 MG PO TBCR
180.0000 mg | EXTENDED_RELEASE_TABLET | Freq: Every day | ORAL | Status: DC
  Filled 2015-08-05: qty 1

## 2015-08-05 MED ORDER — ENOXAPARIN SODIUM 40 MG/0.4ML ~~LOC~~ SOLN
40.0000 mg | SUBCUTANEOUS | Status: DC
  Administered 2015-08-05 – 2015-08-07 (×3): 40 mg via SUBCUTANEOUS
  Filled 2015-08-05 (×3): qty 0.4

## 2015-08-05 MED ORDER — LORATADINE 10 MG PO TABS
10.0000 mg | ORAL_TABLET | Freq: Every day | ORAL | Status: DC
  Administered 2015-08-06 – 2015-08-11 (×4): 10 mg via ORAL
  Filled 2015-08-05 (×4): qty 1

## 2015-08-05 MED ORDER — ACETAMINOPHEN 650 MG RE SUPP: 650.0000 mg | Freq: Four times a day (QID) | RECTAL | Status: DC | PRN

## 2015-08-05 MED ORDER — ESTRADIOL 1 MG PO TABS
0.5000 mg | ORAL_TABLET | Freq: Every day | ORAL | Status: DC
  Filled 2015-08-05: qty 0.5

## 2015-08-05 MED ORDER — POLYETHYLENE GLYCOL 3350 17 G PO PACK: 17.0000 g | PACK | Freq: Every day | ORAL | Status: DC | PRN

## 2015-08-05 MED ORDER — FAMOTIDINE 20 MG PO TABS: 20.0000 mg | ORAL_TABLET | Freq: Two times a day (BID) | ORAL | Status: DC

## 2015-08-05 MED ORDER — FERROUS SULFATE 325 (65 FE) MG PO TABS
325.0000 mg | ORAL_TABLET | Freq: Two times a day (BID) | ORAL | Status: DC
  Administered 2015-08-06 – 2015-08-09 (×6): 325 mg via ORAL
  Filled 2015-08-05 (×7): qty 1

## 2015-08-05 MED ORDER — FAMOTIDINE IN NACL 20-0.9 MG/50ML-% IV SOLN
20.0000 mg | Freq: Two times a day (BID) | INTRAVENOUS | Status: DC
  Administered 2015-08-05 – 2015-08-06 (×3): 20 mg via INTRAVENOUS
  Filled 2015-08-05 (×7): qty 50

## 2015-08-05 MED ORDER — SERTRALINE HCL 50 MG PO TABS
50.0000 mg | ORAL_TABLET | Freq: Every morning | ORAL | Status: DC
  Administered 2015-08-06 – 2015-08-11 (×4): 50 mg via ORAL
  Filled 2015-08-05 (×4): qty 1

## 2015-08-05 MED ORDER — SALINE SPRAY 0.65 % NA SOLN
1.0000 | NASAL | Status: DC | PRN
  Administered 2015-08-07: 1 via NASAL
  Filled 2015-08-05: qty 44

## 2015-08-05 MED ORDER — DEXTROSE 5 % IV SOLN
250.0000 mg | INTRAVENOUS | Status: DC
  Administered 2015-08-06 – 2015-08-07 (×2): 250 mg via INTRAVENOUS
  Filled 2015-08-05 (×2): qty 250

## 2015-08-05 MED ORDER — IPRATROPIUM-ALBUTEROL 0.5-2.5 (3) MG/3ML IN SOLN
3.0000 mL | Freq: Three times a day (TID) | RESPIRATORY_TRACT | Status: DC
  Administered 2015-08-06 – 2015-08-11 (×15): 3 mL via RESPIRATORY_TRACT
  Filled 2015-08-05 (×15): qty 3

## 2015-08-05 MED ORDER — SODIUM CHLORIDE 0.9% FLUSH
3.0000 mL | Freq: Two times a day (BID) | INTRAVENOUS | Status: DC
  Administered 2015-08-05 – 2015-08-09 (×7): 3 mL via INTRAVENOUS

## 2015-08-05 MED ORDER — ALBUTEROL SULFATE (2.5 MG/3ML) 0.083% IN NEBU
2.5000 mg | INHALATION_SOLUTION | Freq: Four times a day (QID) | RESPIRATORY_TRACT | Status: DC
  Administered 2015-08-05 (×2): 2.5 mg via RESPIRATORY_TRACT
  Filled 2015-08-05: qty 3

## 2015-08-05 MED ORDER — PYRIDOSTIGMINE BROMIDE ER 180 MG PO TBCR: 180.0000 mg | EXTENDED_RELEASE_TABLET | Freq: Every day | ORAL | Status: DC

## 2015-08-05 MED ORDER — ALBUTEROL SULFATE (2.5 MG/3ML) 0.083% IN NEBU
2.5000 mg | INHALATION_SOLUTION | RESPIRATORY_TRACT | Status: DC | PRN
  Administered 2015-08-06: 2.5 mg via RESPIRATORY_TRACT
  Filled 2015-08-05: qty 3

## 2015-08-05 MED ORDER — PYRIDOSTIGMINE BROMIDE 60 MG PO TABS
60.0000 mg | ORAL_TABLET | Freq: Four times a day (QID) | ORAL | Status: DC
  Administered 2015-08-05 – 2015-08-06 (×4): 60 mg via ORAL
  Filled 2015-08-05 (×7): qty 1

## 2015-08-05 MED ORDER — SODIUM CHLORIDE 0.9 % IV BOLUS (SEPSIS)
1000.0000 mL | Freq: Once | INTRAVENOUS | Status: AC
  Administered 2015-08-05: 1000 mL via INTRAVENOUS

## 2015-08-05 MED ORDER — PYRIDOSTIGMINE BROMIDE 60 MG PO TABS
60.0000 mg | ORAL_TABLET | ORAL | Status: AC
  Administered 2015-08-05 – 2015-08-06 (×3): 60 mg via ORAL
  Filled 2015-08-05 (×3): qty 1

## 2015-08-05 MED ORDER — ESTRADIOL 1 MG PO TABS
0.5000 mg | ORAL_TABLET | Freq: Every day | ORAL | Status: DC
  Administered 2015-08-06 – 2015-08-11 (×4): 0.5 mg via ORAL
  Filled 2015-08-05 (×6): qty 0.5

## 2015-08-05 NOTE — H&P (Signed)
[facility] Admission History and Physical Service Pager: [phone]  Patient name: Frances Medina Medical record number: [id number] Date of birth: 03-22-1960 Age: 55 y.o. Gender: female  Primary Care Provider: Andrena Mews, MD Consultants: None Code Status: Full  Chief Complaint: Cough, shortness of breath, difficulty swallowing.  Assessment and Plan: Frances Medina is a 55 y.o. female presenting with cough, shortness of breath, and difficulty swallowing. PMH is significant for myasthenia gravis, HFpEF, splenomegaly, HTN, depression/anxiety.  Shortness of Breath: Pt having cough with sputum production for the last few days. CXR showing bibasilar atelectasis. In the ED, having desaturations to 88% on room air and was placed on 2L O2. On exam, Pt has crackles in the lung bases bilaterally and diffuse expiratory wheezing. Differentials include CAP, aspiration pneumonia/pneumonitis, bronchitis in the setting of recent viral illness, COPD exacerbation, or CHF exacerbation. She could have CAP given her persistent cough and new O2 requirement, but she is afebrile without an elevated WBC count. She could have aspiration pneumonia, especially in the setting of myasthenia gravis, but she denies any recent choking spells. She has had recent rhinorrhea, watery eyes, and sinus pressure, so she could have a viral URI that has developed into a bronchitis. COPD is also a possibility, in the setting of cough with sputum production, especially given her diffuse wheezing on exam and 20 pack year smoking history. She does not have a confirmed diagnosis of COPD. Given the crackles in her lung bases and trace lower extremity edema on exam, she could have a CHF exacerbation, but ECHO in 04/2015 showed EF 60-65% and G1DD.  - Admit to telemetry under observation status, attending Dr. Erin Hearing. - s/p Azithromycin IV in the ED. Will continue Azithromycin daily x 5 days for coverage of CAP vs  COPD exacerbation. Pt allergic to Penicillins and Ciprofloxacin. - NIF every 12 hours - Will order a BNP to rule out CHF exacerbation. - Blood cultures drawn in the ED are pending - Will schedule Albuterol every 6 hours for wheezing/SOB - May need to consider addition of Prednisone if significant concern for COPD exacerbation.  - Recommend PFTs as outpatient.  - Wean O2 as tolerated  - PT/OT consult - Cardiac monitoring, continuous pulse ox  Dysphagia: Chronic related to myasthenia, with recent acute worsening.  - NPO for now, pending swallow eval on 6/5.  - Converted po medications to IV where possible, though pyridostigmine IV is not available. Because this medication is critical, it will be administered orally with thickened fluids.  - Start gentle IVF  Myasthenia Gravis: Follows with Dr. Porfirio Oar (Neurology) at [facility]. Also, just started speech therapy for assistance with swallowing this past week. Pt states she is not having any weakness at this time. - Consider AM Neurology consult  - Will need to cancel Pt's appointment with her Neurologist on 6/5. - Continue home med: Pyridostigmine 60mg  qid and 180mg  at bedtime - Speech consult, as above.  HFpEF: ECHO 04/09/15 performed due to SOB showed EF of 60-65% and G1DD. Patient currently appears euvolemic. - BNP ordered - Continue to monitor  Splenomegaly w/Lymphadenopathy: Being followed by Oncologist Dr. Irene Limbo. Work-up thus far has shown HIV neg, mono screen neg, elevated LDH at 395 and low haptoglobin at <10. Lymphoma ruled out with PET scan performed on 3/13. - Continue to monitor CBCs   Hx of Normocytic Anemia: Also followed by Dr. Irene Limbo (heme-onc). Last anemia panel 04/2015: Iron 76 (nl), TIBC 286 (nl), ferritin 688 (high). Has had  elevated LDH, low haptoglobin, increased reticulocyte count, and spherocytes on peripheral blood smear in the past. 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 Hgb 13.1 in the ED, MCV 77.3. - Continue  home ferrous sulfate 325mg  bid - Miralax prn - Daily CBCs  History of HTN: BP 106/96 in the ED. - Not on any home anti-hypertensives - Continue to monitor. Will add prn if needed.   Seasonal allergies: Pt endorses rhinorrhea, itchy eyes, sore throat - Will start Claritin and ocean nasal spray  Depression and Anxiety: - Zoloft 50mg  daily, Klonopin 0.5-1mg  bid prn  GERD: - Continue home Pepcid  FEN/GI: NPO until swallow eval Prophylaxis: Lovenox  Disposition: Admit to telemetry under observation status, attending Dr. Erin Hearing.   History of Present Illness:  Frances Medina is a 55 y.o. female presenting with cough, shortness of breath, and difficulty swallowing. She has been having cough with sputum production, shortness of breath, and wheezing for the last 5 days. The sputum was initially clear, but changed to a yellow/brown color this morning. She denies any fevers or chills. The phlegm has been so thick that she has had difficulty swallowing. The phlegm became thicker this morning and she could no longer swallow liquids. She denies any weakness or recent choking spells.  She endorses recent runny nose, itchy eyes, sore throat, intermittent "achy" frontal headache. She also endorses changes in vision. She denies any chest pain or lower extremity swelling.   In the ED, she required 2L O2 by [place] for desaturations to 88%. CXR showed bibasilar atelectasis. She was given Azithromycin IV x 1 and Albuterol nebulizers x 2.   Review Of Systems: Per HPI with the following additions: None Otherwise the remainder of the systems were negative.  Patient Active Problem List   Diagnosis Date Noted  . Bronchitis 08/05/2015  . Tracheostomy status ([place])   . Endotracheally intubated   . VAP (ventilator-associated pneumonia) ([place])   . Acute respiratory failure with hypoxemia ([place])   . Depression   . Headache, migraine   . Tachypnea   . Enteritis due to Clostridium difficile   . Leukocytosis   .  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 Acute respiratory failure ([place])   . MG (myasthenia gravis) ([place]) 05/16/2015  . Myasthenia exacerbation ([place])   . Difficult intravenous access   . HTN (hypertension), benign   . Myasthenia gravis with acute exacerbation ([place]) 05/15/2015  . Shortness of breath   . Diplopia   . Dysphagia, neurologic   . Elevated LDH 05/06/2015  . Elevated BP 05/01/2015  . Fatigue 05/01/2015  . Chest wall pain 04/08/2015  . Dyspnea 04/07/2015  . Hypokalemia 04/07/2015  . Anemia 04/07/2015  . Neutropenia ([place]) 04/07/2015  . Lymphadenopathy 04/07/2015  . Splenomegaly 04/07/2015  . Myasthenia gravis ([place])   . Dysphagia   . Inability to swallow 03/20/2015  . Osteoarthritis of left knee 12/02/2013  . Primary localized osteoarthrosis of the knee 12/02/2013  . Benign neoplasm of colon 09/19/2013  . Osteoarthritis of right knee 08/30/2013  . GERD (gastroesophageal reflux disease) 06/23/2013  . Obesity 06/23/2013  . Anxiety and depression 06/23/2013  . Fibroids 06/23/2013    Past Medical History: Past Medical History  Diagnosis Date  . GERD (gastroesophageal reflux disease)   . 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 Knee injury   . Depression   . Seasonal allergies     takes Zytrec  . Fibroid uterus     size of a  dime  . Umbilical hernia     watching , no plans for surgery at present  . H/O hiatal hernia   . Hypertension     "went away when I stopped smoking"  . Anxiety   . Migraine     "maybe couple times/month" (03/20/2015)  . Arthritis     "knees" (03/20/2015)  . Osteoarthritis of right knee 08/30/2013  . Osteoarthritis of left knee 12/02/2013  . Myasthenia gravis ([place]) 2017  . E. coli UTI 04/07/2015    Past Surgical History: Past Surgical History  Procedure Laterality Date  . Cesarean section  1987; 1989  . Dilation and curettage of uterus    . Tubal ligation  1989  . Vaginal hysterectomy  1990's?    "apparently took out one of my ovaries at the time too  cause one's missing"  . Partial knee arthroplasty Right 08/30/2013    Procedure: RIGHT UNICOMPARTMENTAL KNEE;  Surgeon: Johnny Bridge, MD;  Location: [facility];  Service: Orthopedics;  Laterality: Right;  . Colonoscopy with propofol N/A 09/19/2013    Procedure: COLONOSCOPY WITH PROPOFOL;  Surgeon: Ladene Artist, MD;  Location: [facility];  Service: Endoscopy;  Laterality: N/A;  . Partial knee arthroplasty Left 12/02/2013    Procedure: LEFT KNEE UNI ARTHROPLASTY;  Surgeon: Johnny Bridge, MD;  Location: [facility];  Service: Orthopedics;  Laterality: Left;  . Esophagogastroduodenoscopy (egd) with propofol N/A 03/21/2015    Procedure: ESOPHAGOGASTRODUODENOSCOPY (EGD) WITH PROPOFOL;  Surgeon: Gatha Mayer, MD;  Location: [facility];  Service: Endoscopy;  Laterality: N/A;    Social History: Social History  Substance Use Topics  . Smoking status: Former Smoker -- 0.50 packs/day for 40 years    Types: Cigarettes    Quit date: 05/14/2012  . Smokeless tobacco: Never Used  . Alcohol Use: No   Additional social history: None Please also refer to relevant sections of EMR.  Family History: Family History  Problem Relation Age of Onset  . Heart disease Mother 57  . Hypertension Mother   . Stroke Father   . Hypertension Father   . Colon cancer Neg Hx   . Other Sister     Esophageal strictures  . Anemia Sister     Allergies and Medications: Allergies  Allergen Reactions  . Dicyclomine Hives  . Methocarbamol Hives  . Penicillins Hives    Has patient had a PCN reaction causing immediate rash, facial/tongue/throat swelling, SOB or lightheadedness with hypotension: Yes Has patient had a PCN reaction causing severe rash involving mucus membranes or skin necrosis: No Has patient had a PCN reaction that required hospitalization No Has patient had a PCN reaction occurring within the last 10 years: No If all of the above answers are "NO", then may proceed with Cephalosporin  use.   . Ciprofloxacin     Rash, shortness of breath  . Shrimp [Shellfish Allergy]    No current facility-administered medications on file prior to encounter.   Current Outpatient Prescriptions on File Prior to Encounter  Medication Sig Dispense Refill  . albuterol (PROVENTIL HFA;VENTOLIN HFA) 108 (90 Base) MCG/ACT inhaler Inhale 2 puffs into the lungs every 4 (four) hours as needed for wheezing or shortness of breath. 1 Inhaler 0  . Chlorphen-PE-Acetaminophen (NOREL AD) 4-10-325 MG TABS Take 1 tablet by mouth 2 (two) times daily as needed. Reported on 06/18/2015    . clonazePAM (KLONOPIN) 1 MG tablet Take 0.5-1 tablets (0.5-1 mg total) by mouth 2 (two) times daily as needed  for anxiety. 45 tablet 2  . estradiol (ESTRACE) 0.5 MG tablet Take 0.5 mg by mouth daily.    . famotidine (PEPCID) 20 MG tablet Take 1 tablet (20 mg total) by mouth 2 (two) times daily. 30 tablet 0  . ferrous sulfate 325 (65 FE) MG tablet Take 1 tablet (325 mg total) by mouth 2 (two) times daily with a meal. 60 tablet 0  . pyridostigmine (MESTINON) 60 MG tablet Take 1 tablet (60 mg total) by mouth 4 (four) times daily. 120 tablet 1  . sertraline (ZOLOFT) 100 MG tablet Take 50 mg by mouth every morning.     . Vitamin D, Ergocalciferol, (DRISDOL) 50000 units CAPS capsule Take 1 capsule (50,000 Units total) by mouth every 7 (seven) days. 4 capsule 1    Objective: BP 106/96 mmHg  Pulse 109  Temp(Src) 98.6 F (37 C) (Oral)  Resp 18  Ht 5' (1.524 m)  Wt 238 lb 8 oz (108.183 kg)  BMI 46.58 kg/m2  SpO2 94% Exam: General: Tired-appearing female, laying in bed, in NAD Eyes: PERRLA, EOMI, conjugate gaze, no scleral icterus, no discharge ENTM: Nose normal without discharge, MMM, hoarse voice. Neck: Supple, no lymphadenopathy Cardiovascular: RRR, no murmurs, 2+ DP pulses Respiratory: Mildly increased work of breathing, prolonged expiratory phase, crackles present in the lung bases bilaterally, scattered expiratory wheezes  throughout all lung fields, [place] in place. Good air movement but limited symmetric excursions.  Abdomen: +BS, soft, non-tender, non-distended MSK: No edema or cyanosis Skin: No rashes, no lesions Neuro: Awake, alert, oriented, CN 2-12 grossly intact, no focal deficits. Psych: Appropriate affect, normal behavior.  Labs and Imaging: CBC BMET   Recent Labs Lab 08/05/15 1022  WBC 9.4  HGB 13.1  HCT 42.8  PLT 273    Recent Labs Lab 08/05/15 1022  NA 138  K 3.7  CL 102  CO2 28  BUN 9  CREATININE 0.68  GLUCOSE 132*  CALCIUM 9.0     CXR (6/4): Bibasilar atelectasis, low lung volumes  Sela Hua, MD 08/05/2015, 1:01 PM PGY-1, [facility] pager: [phone], text pages welcome  I have seen and evaluated the patient with Dr. Brett Albino. I am in agreement with the note above in its revised form. My additions are in red.  Aleiya Rye B. Bonner Puna, MD, PGY-3 08/05/2015 3:23 PM

## 2015-08-05 NOTE — Evaluation (Signed)
Physical Therapy Evaluation Patient Details Name: Frances Medina MRN: [id number] DOB: 1960-04-23 Today's Date: 08/05/2015   History of Present Illness  Pt is a 55 y/o F presenting w/ coug, SOB, and difficulty swallowing.  Chest x-ray showed bibasilar atelectasis.  Pt's PMH includes myasthenia gravis, splenomegaly, depression, anxiety, intubated during last admission, diplopia, anemia, obesity, Lt and Rt partial knee arthroplasty.    Clinical Impression  Pt admitted with above diagnosis. Pt currently with functional limitations due to the deficits listed below (see PT Problem List). [name] presents w/ decreased endurance.  PTA pt Ind w/ ADLs and using cane when out in the community.  She will have intermittent assist available at home at d/c.  HR ranging 123-127 ambulating in room today.  PT will focus on improving ambulatory endurance and high level balance activities/exercises. Pt will benefit from skilled PT to increase their independence and safety with mobility to allow discharge to the venue listed below.      Follow Up Recommendations No PT follow up;Supervision - Intermittent    Equipment Recommendations  None recommended by PT    Recommendations for Other Services Speech consult     Precautions / Restrictions Precautions Precautions: Fall Precaution Comments: monitor O2 Restrictions Weight Bearing Restrictions: No      Mobility  Bed Mobility Overal bed mobility: Modified Independent             General bed mobility comments: Increased time and effort.  No physical assist needed.  Transfers Overall transfer level: Needs assistance Equipment used: None Transfers: Sit to/from Stand Sit to Stand: Supervision         General transfer comment: Supervision for safety  Ambulation/Gait Ambulation/Gait assistance: Supervision Ambulation Distance (Feet): 80 Feet Assistive device: None Gait Pattern/deviations: Step-through pattern   Gait velocity interpretation:  Below normal speed for age/gender General Gait Details: Supervision for safety and managment of O2 line.  SpO2 remains in mid-high 90s on 2L O2.  HR ranges 123-127.  Pt fatigues and requests to sit after ambulating 80 ft.  Stairs            Wheelchair Mobility    Modified Rankin (Stroke Patients Only)       Balance Overall balance assessment: Needs assistance Sitting-balance support: No upper extremity supported;Feet supported Sitting balance-Leahy Scale: Good     Standing balance support: No upper extremity supported;During functional activity Standing balance-Leahy Scale: Good                               Pertinent Vitals/Pain Pain Assessment: No/denies pain    Home Living Family/patient expects to be discharged to:: Private residence Living Arrangements: Spouse/significant other;Children Available Help at Discharge: Family;Available PRN/intermittently Type of Home: House Home Access: Stairs to enter Entrance Stairs-Rails: Left Entrance Stairs-Number of Steps: 3 Home Layout: Two level;Able to live on main level with bedroom/bathroom Home Equipment: Gilford Rile - 2 wheels;Cane - single point;Bedside commode;Shower seat      Prior Function Level of Independence: Independent with assistive device(s)         Comments: Uses cane when in community.  Ind w/ ADLs.  Received ~4 PT sessions following most recent hospitalization.     Hand Dominance        Extremity/Trunk Assessment   Upper Extremity Assessment: Defer to OT evaluation           Lower Extremity Assessment: Overall WFL for tasks assessed      Cervical /  Trunk Assessment: Normal  Communication   Communication:  (hoarse)  Cognition Arousal/Alertness: Awake/alert Behavior During Therapy: WFL for tasks assessed/performed Overall Cognitive Status: Within Functional Limits for tasks assessed                      General Comments      Exercises        Assessment/Plan     PT Assessment Patient needs continued PT services  PT Diagnosis Difficulty walking   PT Problem List Decreased activity tolerance;Decreased balance;Cardiopulmonary status limiting activity;Obesity  PT Treatment Interventions DME instruction;Gait training;Stair training;Functional mobility training;Therapeutic activities;Therapeutic exercise;Balance training;Patient/family education   PT Goals (Current goals can be found in the Care Plan section) Acute Rehab PT Goals Patient Stated Goal: to feel better and go home PT Goal Formulation: With patient/family Time For Goal Achievement: 08/19/15 Potential to Achieve Goals: Good    Frequency Min 3X/week   Barriers to discharge Inaccessible home environment;Decreased caregiver support Intermittent assist at home and steps to enter home    Co-evaluation               End of Session Equipment Utilized During Treatment: Oxygen Activity Tolerance: Patient limited by fatigue Patient left: in chair;with call bell/phone within reach;with family/visitor present Nurse Communication: Mobility status;Other (comment) (SpO2, HR)    Functional Assessment Tool Used: Clincal Judgement Functional Limitation: Mobility: Walking and moving around Mobility: Walking and Moving Around Current Status [id number]): At least 1 percent but less than 20 percent impaired, limited or restricted Mobility: Walking and Moving Around Goal Status [phone]): 0 percent impaired, limited or restricted    Time: [id number] PT Time Calculation (min) (ACUTE ONLY): 21 min   Charges:   PT Evaluation $PT Eval Low Complexity: 1 Procedure     PT G Codes:   PT G-Codes **NOT FOR INPATIENT CLASS** Functional Assessment Tool Used: Clincal Judgement Functional Limitation: Mobility: Walking and moving around Mobility: Walking and Moving Around Current Status [id number]): At least 1 percent but less than 20 percent impaired, limited or restricted Mobility: Walking and Moving Around Goal  Status [phone]): 0 percent impaired, limited or restricted   Collie Siad PT, DPT  Pager: [phone] Phone: [phone] 08/05/2015, 4:11 PM

## 2015-08-05 NOTE — ED Notes (Addendum)
patien here with 4 days of difficulty swallowing. Has speech therapist and they tried to assist her with swallowing this week with no improvement. No distress. Patient having hard time swallowing pills and concerned with minimal nutrition and weight loss. Speech therapist suggested she come to make sure no pneumonia, has had increased thick sputum when she clears her throat.

## 2015-08-05 NOTE — ED Provider Notes (Signed)
CSN: [id number]     Arrival date & time 08/05/15  [id number] History   First MD Initiated Contact with Patient 08/05/15 1004     Chief Complaint  Patient presents with  . difficulty swallowing      (Consider location/radiation/quality/duration/timing/severity/associated sxs/prior Treatment) HPI  55 year old female presents with difficulty swallowing since this morning. Has had 4 or 5 days of cough area did now has yellow sputum. Is also having shortness of breath and wheezing. No fevers during this time. Chronically since January 2017 has difficulty swallowing that is attributed to her myasthenia. However this morning she feels like she can't even tolerate liquids anymore. She regurgitates all liquids including water. This is despite trying to tuck her chin and when swallowing. No chest or abdominal pain.  Past Medical History  Diagnosis Date  . GERD (gastroesophageal reflux disease)   . 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 Knee injury   . Depression   . Seasonal allergies     takes Zytrec  . Fibroid uterus     size of a dime  . Umbilical hernia     watching , no plans for surgery at present  . H/O hiatal hernia   . Hypertension     "went away when I stopped smoking"  . Anxiety   . Migraine     "maybe couple times/month" (03/20/2015)  . Arthritis     "knees" (03/20/2015)  . Osteoarthritis of right knee 08/30/2013  . Osteoarthritis of left knee 12/02/2013  . Myasthenia gravis ([place]) 2017  . E. coli UTI 04/07/2015   Past Surgical History  Procedure Laterality Date  . Cesarean section  1987; 1989  . Dilation and curettage of uterus    . Tubal ligation  1989  . Vaginal hysterectomy  1990's?    "apparently took out one of my ovaries at the time too cause one's missing"  . Partial knee arthroplasty Right 08/30/2013    Procedure: RIGHT UNICOMPARTMENTAL KNEE;  Surgeon: Johnny Bridge, MD;  Location: [facility];  Service: Orthopedics;  Laterality: Right;  . Colonoscopy with propofol N/A 09/19/2013   Procedure: COLONOSCOPY WITH PROPOFOL;  Surgeon: Ladene Artist, MD;  Location: [facility];  Service: Endoscopy;  Laterality: N/A;  . Partial knee arthroplasty Left 12/02/2013    Procedure: LEFT KNEE UNI ARTHROPLASTY;  Surgeon: Johnny Bridge, MD;  Location: [facility];  Service: Orthopedics;  Laterality: Left;  . Esophagogastroduodenoscopy (egd) with propofol N/A 03/21/2015    Procedure: ESOPHAGOGASTRODUODENOSCOPY (EGD) WITH PROPOFOL;  Surgeon: Gatha Mayer, MD;  Location: [facility];  Service: Endoscopy;  Laterality: N/A;   Family History  Problem Relation Age of Onset  . Heart disease Mother 50  . Hypertension Mother   . Stroke Father   . Hypertension Father   . Colon cancer Neg Hx   . Other Sister     Esophageal strictures  . Anemia Sister    Social History  Substance Use Topics  . Smoking status: Former Smoker -- 0.50 packs/day for 40 years    Types: Cigarettes    Quit date: 05/14/2012  . Smokeless tobacco: Never Used  . Alcohol Use: No   OB History    No data available     Review of Systems  Constitutional: Negative for fever.  HENT: Positive for trouble swallowing.   Respiratory: Positive for cough, shortness of breath and wheezing.   Gastrointestinal: Negative for vomiting and abdominal pain.  All other systems reviewed and are  negative.     Allergies  Dicyclomine; Methocarbamol; Penicillins; Ciprofloxacin; and Shrimp  Home Medications   Prior to Admission medications   Medication Sig Start Date End Date Taking? Authorizing Provider  albuterol (PROVENTIL HFA;VENTOLIN HFA) 108 (90 Base) MCG/ACT inhaler Inhale 2 puffs into the lungs every 4 (four) hours as needed for wheezing or shortness of breath. 04/07/15   Shawn C Joy, PA-C  Chlorphen-PE-Acetaminophen (NOREL AD) 4-10-325 MG TABS Take 1 tablet by mouth 2 (two) times daily as needed. Reported on 06/18/2015    Historical Provider, MD  clonazePAM (KLONOPIN) 1 MG tablet Take 0.5-1 tablets (0.5-1  mg total) by mouth 2 (two) times daily as needed for anxiety. 06/12/15   [name], MD  estradiol (ESTRACE) 0.5 MG tablet Take 0.5 mg by mouth daily.    Historical Provider, MD  famotidine (PEPCID) 20 MG tablet Take 1 tablet (20 mg total) by mouth 2 (two) times daily. 06/08/15   Nicolette Bang, DO  ferrous sulfate 325 (65 FE) MG tablet Take 1 tablet (325 mg total) by mouth 2 (two) times daily with a meal. 04/07/15   Shawn C Joy, PA-C  pyridostigmine (MESTINON) 60 MG tablet Take 1 tablet (60 mg total) by mouth 4 (four) times daily. 06/08/15   Nicolette Bang, DO  sertraline (ZOLOFT) 100 MG tablet Take 50 mg by mouth every morning.     Historical Provider, MD  Vitamin D, Ergocalciferol, (DRISDOL) 50000 units CAPS capsule Take 1 capsule (50,000 Units total) by mouth every 7 (seven) days. 05/02/15   [name], MD   BP 137/99 mmHg  Pulse 92  Temp(Src) 98.6 F (37 C) (Oral)  Resp 18  Ht 5' (1.524 m)  Wt 238 lb 8 oz (108.183 kg)  BMI 46.58 kg/m2  SpO2 95% Physical Exam  Constitutional: She is oriented to person, place, and time. She appears well-developed and well-nourished.  HENT:  Head: Normocephalic and atraumatic.  Right Ear: External ear normal.  Left Ear: External ear normal.  Nose: Nose normal.  Mouth/Throat: No oropharyngeal exudate, posterior oropharyngeal edema or posterior oropharyngeal erythema.  Eyes: Right eye exhibits no discharge. Left eye exhibits no discharge.  Cardiovascular: Normal rate, regular rhythm and normal heart sounds.   Pulmonary/Chest: Effort normal. She has decreased breath sounds in the right lower field. She has wheezes.  Abdominal: Soft. She exhibits no distension. There is no tenderness.  Neurological: She is alert and oriented to person, place, and time.  Skin: Skin is warm and dry.  Nursing note and vitals reviewed.   ED Course  Procedures (including critical care time) Labs Review Labs Reviewed  COMPREHENSIVE METABOLIC PANEL  - Abnormal; Notable for the following:    Glucose, Bld 132 (*)    All other components within normal limits  CBC WITH DIFFERENTIAL/PLATELET - Abnormal; Notable for the following:    RBC 5.54 (*)    MCV 77.3 (*)    MCH 23.6 (*)    RDW 17.2 (*)    Neutro Abs 8.8 (*)    Lymphs Abs 0.4 (*)    All other components within normal limits  CULTURE, BLOOD (ROUTINE X 2)  CULTURE, BLOOD (ROUTINE X 2)    Imaging Review Dg Chest 2 View  08/05/2015  CLINICAL DATA:  Cough and thick phlegm EXAM: CHEST  2 VIEW COMPARISON:  05/28/2015 FINDINGS: Normal heart size. No pleural effusion or edema. Decreased lung volumes. Atelectasis is present in both lung bases. IMPRESSION: 1. Low lung volumes with bibasilar  atelectasis. Electronically Signed   By: Kerby Moors M.D.   On: 08/05/2015 11:02   I have personally reviewed and evaluated these images and lab results as part of my medical decision-making.   EKG Interpretation None      MDM   Final diagnoses:  Acute respiratory failure with hypoxia ([place])    Patient has diffuse wheezing and also decreased breath sounds in right lower lung. X-ray shows no obvious pneumonia. Her wheezing is improved and she feels better after an albuterol neb. However now she has intermittent oxygen saturation saturations in the mid 80s. Given her significant comorbidities including myasthenia gravis, I will have her observed in the hospital with family practice. Given IV fluids and antibiotics in addition to breathing treatments. Placed on supplemental oxygen.    Sherwood Gambler, MD 08/05/15 [phone]

## 2015-08-05 NOTE — Progress Notes (Signed)
NIf done with great efforts, NIF was greater than -60.

## 2015-08-05 NOTE — Progress Notes (Addendum)
Speech Language Pathology Treatment:    Patient Details Name: Frances Medina MRN: [id number] DOB: 1960-12-02 Today's Date: 08/05/2015 Time:  -     Received call from RN. SLP reviewed chart after speaking with RN. Speech Pathology services is familiar with [name] who has chronic dysphagia. She is receiving outpatient ST currently and was consuming a Dys 1 (puree) diet and nectar thick liquids prior to this admission. SLP iss not able to see pt today. Given shortness of breath and difficulty swallowing with this admission, recommend she continue NPO status and ST will assess current status in the am.    Cranford Mon.Ed [employer] [phone]

## 2015-08-06 ENCOUNTER — Observation Stay (HOSPITAL_COMMUNITY): Payer: BC Managed Care – PPO

## 2015-08-06 DIAGNOSIS — J40 Bronchitis, not specified as acute or chronic: Secondary | ICD-10-CM | POA: Diagnosis not present

## 2015-08-06 DIAGNOSIS — J189 Pneumonia, unspecified organism: Secondary | ICD-10-CM | POA: Diagnosis not present

## 2015-08-06 DIAGNOSIS — J9601 Acute respiratory failure with hypoxia: Secondary | ICD-10-CM | POA: Diagnosis not present

## 2015-08-06 LAB — BASIC METABOLIC PANEL
Anion gap: 8 (ref 5–15)
BUN: <5 mg/dL — ABNORMAL LOW (ref 6–20)
CO2: 29 mmol/L (ref 22–32)
Calcium: 8.8 mg/dL — ABNORMAL LOW (ref 8.9–10.3)
Chloride: 104 mmol/L (ref 101–111)
Creatinine, Ser: 0.63 mg/dL (ref 0.44–1.00)
GFR calc Af Amer: >60 mL/min (ref >60)
GFR calc non Af Amer: >60 mL/min (ref >60)
Glucose, Bld: 105 mg/dL — ABNORMAL HIGH (ref 65–99)
Potassium: 3.1 mmol/L — ABNORMAL LOW (ref 3.5–5.1)
Sodium: 141 mmol/L (ref 135–145)

## 2015-08-06 LAB — CBC
HCT: 40.4 % (ref 36.0–46.0)
Hemoglobin: 11.8 g/dL — ABNORMAL LOW (ref 12.0–15.0)
MCH: 22.9 pg — ABNORMAL LOW (ref 26.0–34.0)
MCHC: 29.2 g/dL — ABNORMAL LOW (ref 30.0–36.0)
MCV: 78.4 fL (ref 78.0–100.0)
Platelets: 237 10*3/uL (ref 150–400)
RBC: 5.15 MIL/uL — ABNORMAL HIGH (ref 3.87–5.11)
RDW: 17.6 % — ABNORMAL HIGH (ref 11.5–15.5)
WBC: 6.6 10*3/uL (ref 4.0–10.5)

## 2015-08-06 MED ORDER — RESOURCE THICKENUP CLEAR PO POWD
ORAL | Status: DC | PRN
  Filled 2015-08-06: qty 125

## 2015-08-06 MED ORDER — POTASSIUM CHLORIDE CRYS ER 20 MEQ PO TBCR
40.0000 meq | EXTENDED_RELEASE_TABLET | Freq: Two times a day (BID) | ORAL | Status: DC
  Administered 2015-08-06 (×2): 40 meq via ORAL
  Filled 2015-08-06 (×2): qty 2

## 2015-08-06 MED ORDER — POTASSIUM CHLORIDE 10 MEQ/100ML IV SOLN
10.0000 meq | INTRAVENOUS | Status: DC
  Filled 2015-08-06 (×4): qty 100

## 2015-08-06 MED ORDER — PREDNISONE 20 MG PO TABS
40.0000 mg | ORAL_TABLET | Freq: Every day | ORAL | Status: AC
  Administered 2015-08-06 – 2015-08-10 (×4): 40 mg via ORAL
  Filled 2015-08-06 (×6): qty 2

## 2015-08-06 MED ORDER — DEXTROSE-NACL 5-0.9 % IV SOLN
INTRAVENOUS | Status: DC
  Administered 2015-08-06 – 2015-08-11 (×5): via INTRAVENOUS

## 2015-08-06 MED ORDER — PYRIDOSTIGMINE BROMIDE ER 180 MG PO TBCR
180.0000 mg | EXTENDED_RELEASE_TABLET | Freq: Every day | ORAL | Status: DC
  Administered 2015-08-06 – 2015-08-10 (×5): 180 mg via ORAL
  Filled 2015-08-06 (×5): qty 1

## 2015-08-06 MED ORDER — PYRIDOSTIGMINE BROMIDE 60 MG PO TABS
60.0000 mg | ORAL_TABLET | Freq: Four times a day (QID) | ORAL | Status: DC
  Administered 2015-08-06 – 2015-08-11 (×16): 60 mg via ORAL
  Filled 2015-08-06 (×21): qty 1

## 2015-08-06 NOTE — Evaluation (Signed)
Occupational Therapy Evaluation Patient Details Name: Frances Medina MRN: [id number] DOB: Jun 03, 1960 Today's Date: 08/06/2015    History of Present Illness Pt is a 55 y/o F presenting w/ coug, SOB, and difficulty swallowing.  Chest x-ray showed bibasilar atelectasis.  Pt's PMH includes myasthenia gravis, splenomegaly, depression, anxiety, intubated during last admission, diplopia, anemia, obesity, Lt and Rt partial knee arthroplasty.   Clinical Impression   Pt at Mod I level with ADLs and self care, sup with mobility. No further OT indicated at thsistime    Follow Up Recommendations  No OT follow up    Equipment Recommendations  None recommended by OT    Recommendations for Other Services       Precautions / Restrictions Precautions Precautions: Fall Precaution Comments: monitor O2 Restrictions Weight Bearing Restrictions: No      Mobility Bed Mobility Overal bed mobility: Modified Independent             General bed mobility comments: Increased time and effort.  No physical assist needed.  Transfers Overall transfer level: Needs assistance Equipment used: None Transfers: Sit to/from Stand Sit to Stand: Supervision         General transfer comment: Supervision for safety    Balance Overall balance assessment: Needs assistance Sitting-balance support: No upper extremity supported;Feet supported Sitting balance-Leahy Scale: Good     Standing balance support: No upper extremity supported;During functional activity Standing balance-Leahy Scale: Good                              ADL Overall ADL's : Modified independent                                             Vision  no change from baseline              Pertinent Vitals/Pain Pain Assessment: No/denies pain     Hand Dominance Left   Extremity/Trunk Assessment Upper Extremity Assessment Upper Extremity Assessment: Overall WFL for tasks assessed   Lower  Extremity Assessment Lower Extremity Assessment: Defer to PT evaluation   Cervical / Trunk Assessment Cervical / Trunk Assessment: Normal   Communication     Cognition Arousal/Alertness: Awake/alert Behavior During Therapy: WFL for tasks assessed/performed Overall Cognitive Status: Within Functional Limits for tasks assessed                     General Comments   pt cooperative                 Home Living Family/patient expects to be discharged to:: Private residence Living Arrangements: Spouse/significant other;Children Available Help at Discharge: Family;Available PRN/intermittently Type of Home: House Home Access: Stairs to enter [employer] of Steps: 3 Entrance Stairs-Rails: Left Home Layout: Two level;Able to live on main level with bedroom/bathroom Alternate Level Stairs-Number of Steps: flight Alternate Level Stairs-Rails: Left Bathroom Shower/Tub: Tub/shower unit;Curtain Shower/tub characteristics: Architectural technologist: Standard     Home Equipment: Environmental consultant - 2 wheels;Cane - single point;Bedside commode;Shower seat      Lives With: Spouse;Family;Daughter    Prior Functioning/Environment Level of Independence: Independent with assistive device(s)        Comments: Uses cane when in community.  Ind w/ ADLs.  Received ~4 PT sessions following most recent hospitalization.    OT Diagnosis: Generalized weakness  OT Problem List: Decreased activity tolerance   OT Treatment/Interventions:      OT Goals(Current goals can be found in the care plan section) Acute Rehab OT Goals Patient Stated Goal: go home today  OT Frequency:     Barriers to D/C:  none                        End of Session Equipment Utilized During Treatment: Oxygen  Activity Tolerance: Patient tolerated treatment well Patient left: in chair;with call bell/phone within reach   Time:  -    Charges:  OT General Charges $OT Visit: 1 Procedure OT  Evaluation $OT Eval Moderate Complexity: 1 Procedure G-Codes:    Britt Bottom 08/06/2015, 12:23 PM

## 2015-08-06 NOTE — Progress Notes (Signed)
Family Medicine Teaching Service Daily Progress Note Intern Pager: [phone]  Patient name: Frances Medina Medical record number: [id number] Date of birth: December 22, 1960 Age: 55 y.o. Gender: female  Primary Care Provider: Andrena Mews, MD Consultants: None Code Status: Full  Pt Overview and Major Events to Date:  6/4: Admitted to FMTS with shortness of breath, likely 2/2 to CAP vs COPD exacerbation.  Assessment and Plan: Frances Medina is a 55 y.o. female presenting with cough, shortness of breath, and difficulty swallowing. PMH is significant for myasthenia gravis, HFpEF, splenomegaly, HTN, depression/anxiety.  Shortness of Breath: CAP vs aspiration pneumonia/pneumonitis vs COPD exacerbation. Stable on 2L O2 overnight, with O2 saturations at 96-100%, weaned to 0.5L O2 this morning. BNP 20, so CHF likely not contributing. On exam, diffuse expiratory wheezing in all lung fields. Pt states she is feeling much better this morning. - Azithromycin daily x 5 days for coverage of CAP vs COPD exacerbation. Pt allergic to Penicillins and Ciprofloxacin. - Given her persistent wheezing this morning, will add Prednisone 40mg  daily x 5 days. - NIF every 12 hours. NIF was >-60 last night. - Blood cultures drawn in the ED are pending - Continue scheduled Albuterol q6hrs. - Recommend PFTs as outpatient.  - Wean O2 as tolerated  - PT/OT consult- no PT f/u - Cardiac monitoring, continuous pulse ox  Hypokalemia: K 3.1 this morning - Replace with K-Dur 40 mEq bid  Dysphagia: Chronic related to myasthenia, with recent acute worsening.  - Swallow eval performed today- recommend dysphagia I diet. - Converted po medications to IV where possible, though pyridostigmine IV is not available. Because this medication is critical, it will be administered orally with thickened fluids.  - Will d/c IVFs today  Myasthenia Gravis: Follows with Dr. Porfirio Oar (Neurology) at [facility] Also, just started speech  therapy for assistance with swallowing this past week. Pt states she is not having any weakness at this time. - Will need to cancel Pt's appointment with her Neurologist on 6/5. - Continue home med: Pyridostigmine 60mg  qid and 180mg  at bedtime - Speech consult, as above.  HFpEF: ECHO 04/09/15 performed due to SOB showed EF of 60-65% and G1DD. Patient currently appears euvolemic. BNP 20 this admission. - Continue to monitor  Splenomegaly w/Lymphadenopathy: Being followed by Oncologist Dr. Irene Limbo. Work-up thus far has shown HIV neg, mono screen neg, elevated LDH at 395 and low haptoglobin at <10. Lymphoma ruled out with PET scan performed on 3/13. - Continue to monitor CBCs   Hx of Normocytic Anemia: Also followed by Dr. Irene Limbo (heme-onc). Last anemia panel 04/2015: Iron 76 (nl), TIBC 286 (nl), ferritin 688 (high). Has had elevated LDH, low haptoglobin, increased reticulocyte count, and spherocytes on peripheral blood smear in the past. 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 Hgb 13.1 in the ED > 11.8 this morning, likely dilutional with drops in all cell lines. MCV 78. - Continue home ferrous sulfate 325mg  bid - Miralax prn - Daily CBCs  History of HTN: BP 125/74 this morning. - Not on any home anti-hypertensives - Continue to monitor. Will add prn if needed.   Seasonal allergies: Pt endorses rhinorrhea, itchy eyes, sore throat - Will start Claritin and ocean nasal spray  Depression and Anxiety: - Zoloft 50mg  daily, Klonopin 0.5-1mg  bid prn  GERD: - Continue home Pepcid  FEN/GI: Dysphagia I diet per speech recommendations. Prophylaxis: Lovenox  Disposition: Discharge pending speech recommendations and ability to come off supplemental O2   Subjective:  Pt states she  is doing much better this morning. Her breathing has improved. She denies any weakness. She would like to go home today.  Objective: Temp:  [98.2 F (36.8 C)-98.6 F (37 C)] 98.2 F (36.8 C) (06/04 2047) Pulse Rate:   [81-112] 111 (06/05 0400) Resp:  [16-24] 24 (06/05 0400) BP: (106-146)/(74-99) 125/74 mmHg (06/04 2047) SpO2:  [88 %-100 %] 97 % (06/05 0400) FiO2 (%):  [21 %] 21 % (06/04 1244) Weight:  [238 lb 8 oz (108.183 kg)] 238 lb 8 oz (108.183 kg) (06/04 0954) Physical Exam: General: Tired-appearing female, sitting up in chair, in NAD, pleasant HEENT: [place]/AT, EOMI, [place] in place, MMM, hoarse voice. Neck: Supple, no lymphadenopathy Cardiovascular: RRR, no murmurs, 2+ DP pulses Respiratory: Normal work of breathing, [place] in place, scattered expiratory wheezes in all lung fields Abdomen: +BS, soft, non-tender, non-distended MSK: No edema or cyanosis Skin: No rashes, no lesions Neuro: Awake, alert, oriented, CN 2-12 grossly intact, no focal deficits. Psych: Appropriate affect, normal behavior.  Laboratory:  Recent Labs Lab 08/05/15 1022  WBC 9.4  HGB 13.1  HCT 42.8  PLT 273    Recent Labs Lab 08/05/15 1022  NA 138  K 3.7  CL 102  CO2 28  BUN 9  CREATININE 0.68  CALCIUM 9.0  PROT 6.6  BILITOT 0.4  ALKPHOS 76  ALT 23  AST 22  GLUCOSE 132*   BNP 20  Imaging/Diagnostic Tests: CXR (6/4): Bibasilar atelectasis, low lung volumes  Sela Hua, MD 08/06/2015, 7:10 AM PGY-1, [place] Intern pager: [phone], text pages welcome

## 2015-08-06 NOTE — Progress Notes (Signed)
Spoke with SLP regarding FEES procedure that was performed this afternoon. The procedure showed that Frances Medina is having significant aspiration. SLP is recommending an esophageal work-up and believes she will likely need a PEG tube. Will get an esophagram and consult GI at this point.  Hyman Bible, MD  PGY-1

## 2015-08-06 NOTE — Discharge Summary (Signed)
[facility] Discharge Summary  Patient name: Frances Medina Medical record number: [id number] Date of birth: 01-May-1960 Age: 55 y.o. Gender: female Date of Admission: 08/05/2015  Date of Discharge: 08/11/15 Admitting Physician: Lind Covert, MD  Primary Care Provider: Andrena Mews, MD Consultants: GI  Indication for Hospitalization: Shortness of breath, difficulty swallowing  Discharge Diagnoses/Problem List:  Shortness of breath likely 2/2 to aspiration pneumonia/pneumonitis Dysphagia Myasthenia gravis HFpEF  Disposition: Home  Discharge Condition: Stable, improved  Discharge Exam:  General: Well-appearing female, sitting up in chair, in NAD, pleasant HEENT: [place]/AT, EOMI, MMM, hoarse voice. Cardiovascular: RRR, no murmurs,  Respiratory: Normal work of breathing, mild diffuse expiratory wheezes Abdomen: +BS, soft, non-tender, non-distended Skin: No rashes, no lesions Neuro: Awake, alert, oriented, no focal deficits.  Brief Hospital Course:  Frances Medina is a 55 year old female with a PMH of myasthenia gravis, dysphagia, and HFpEF who presented to the ED with shortness of breath and difficulty swallowing. She had been having shortness of breath and cough with sputum production for the last few days. On the morning of admission, her phlegm was so thick that she could no longer swallow fluids. In the ED, she had a desaturation to 88% and required 2L O2 by [place]. CXR showed bibasilar atelectasis and she was given Azithromycin IV x 1 (Pt allergic to Penicillins and Ciprofloxacin). She was admitted for further management. Hospital course is described by problem list below.  Shortness of breath 2/2 to chronic aspiration pneumonitis: Pt was treated with Azithromycin x 5 days and Prednisone x 5 days for initial concern of CAP vs COPD exacerbation (Pt does not have a diagnosis of COPD). She was also given Albuterol q6hrs prn for wheezing/SOB. She was able to  be weaned to room air without any difficulty. After further work-up of her dysphagia, we thought her SOB was likely secondary to chronic aspiration. On the day of discharge, she was able to maintain her oxygen saturation at 95% with ambulation, so no home O2 was required.  Chronic dysphagia: Pt has chronic dysphagia related to her Myasthenia gravis with recent worsening thought to be secondary to prolonged intubation. Pt's dysphagia was so severe on the day of admission that she could no longer swallow liquids. We made her NPO and consulted speech therapy. Modified barium swallow was performed and was abnormal. FEES was then performed which showed severe backflow from the cervical esophagus, leading to gross aspiration. Speech then recommended an esophagram, whiched showed decreased mid and distal esophageal motility with reflux of the contrast from the esophagus into the hypopharynx. No esophageal obstruction or narrowing was seen. Speech then recommended PEG tube. GI was consulted and PEG tube was placed on 6/9. She tolerated tube feeds without difficulty. She was discharged with a home health RN and was instructed to continue outpatient speech therapy.  Her other medical conditions, including her Myasthenia gravis, were stable throughout hospitalization and no changes were made to her home medications.   Issues for Follow Up:  1. We had some concern for COPD exacerbation on admission, as Pt was wheezing and has a history of smoking. Recommend PFTs as an outpatient. 2. Throughout her hospitalization, she occasionally required O2 at night for comfort. Recommend outpatient sleep study. 3. Pt had some occasional elevated BPs during admission. Please follow up her BPs as an outpatient. 4. Pt was discharged with [facility]. Please make sure she is receiving these services.  Significant Procedures: 6/5: FEES testing; 6/9: PEG tube placed.  Significant Labs and Imaging:   Recent Labs Lab 08/06/15 0643  08/07/15 0904  WBC 6.6 5.9  HGB 11.8* 12.4  HCT 40.4 41.7  PLT 237 258    Recent Labs Lab 08/06/15 0643 08/07/15 0435  NA 141 142  K 3.1* 3.7  CL 104 106  CO2 29 30  GLUCOSE 105* 97  BUN <5* <5*  CREATININE 0.63 0.51  CALCIUM 8.8* 9.1   BNP 20  -CXR (6/4): Bibasilar atelectasis, low lung volumes -Esophagram (6/5): No esophageal obstruction or narrowing. A 12.5 mm barium tablet passed freely to the stomach without delay, Decreased mid and distal esophageal motility with frequent to and fro motion of retained contrast in the cervical and proximal thoracic esophagus. While prone reflux of this contrast from the esophagus into the hypopharynx was observed, Intermittent small volume of barium aspiration which in general did not elicit a cough, No gastroesophageal reflux occurred spontaneously or was elicited.  Results/Tests Pending at Time of Discharge: None  Discharge Medications:    Medication List    TAKE these medications        albuterol 108 (90 Base) MCG/ACT inhaler  Commonly known as:  PROVENTIL HFA;VENTOLIN HFA  Inhale 2 puffs into the lungs every 4 (four) hours as needed for wheezing or shortness of breath.     clonazePAM 1 MG tablet  Commonly known as:  KLONOPIN  Take 0.5-1 tablets (0.5-1 mg total) by mouth 2 (two) times daily as needed for anxiety.     estradiol 0.5 MG tablet  Commonly known as:  ESTRACE  Take 0.5 mg by mouth daily.     famotidine 20 MG tablet  Commonly known as:  PEPCID  Take 1 tablet (20 mg total) by mouth 2 (two) times daily.     feeding supplement (PRO-STAT SUGAR FREE 64) Liqd  Place 30 mLs into feeding tube 2 (two) times daily.     ferrous sulfate 325 (65 FE) MG tablet  Take 1 tablet (325 mg total) by mouth 2 (two) times daily with a meal.     lactose free nutrition Liqd  Take 237 mLs by mouth 3 (three) times daily with meals.     feeding supplement (JEVITY 1.5 CAL/FIBER) Liqd  Place 275 mLs into feeding tube 4 (four) times  daily.     NOREL AD 4-10-325 MG Tabs  Generic drug:  Chlorphen-PE-Acetaminophen  Take 1 tablet by mouth 2 (two) times daily as needed. Reported on 06/18/2015     pyridostigmine 180 MG CR tablet  Commonly known as:  MESTINON  Take 180 mg by mouth at bedtime.     pyridostigmine 60 MG tablet  Commonly known as:  MESTINON  Take 1 tablet (60 mg total) by mouth 4 (four) times daily.     sertraline 100 MG tablet  Commonly known as:  ZOLOFT  Take 50 mg by mouth every morning.     Vitamin D (Ergocalciferol) 50000 units Caps capsule  Commonly known as:  DRISDOL  Take 1 capsule (50,000 Units total) by mouth every 7 (seven) days.        Discharge Instructions: Please refer to Patient Instructions section of EMR for full details.  Patient was counseled important signs and symptoms that should prompt return to medical care, changes in medications, dietary instructions, activity restrictions, and follow up appointments.   Follow-Up Appointments: Follow-up Information    Follow up with Andrena Mews, MD. Schedule an appointment as soon as possible for a visit in 1 week.  Specialty:  Family Medicine   Why:  For hospital follow-up   Contact information:   [place] [address] [phone]       Follow up with [facility].   Why:  home health nurse for tube feedings   Contact information:   [place] [address] [phone]       Proctor Carriker Dodd Alani Sabbagh, MD 08/12/2015, 11:01 AM PGY-1, [place]

## 2015-08-06 NOTE — Progress Notes (Signed)
Initial Nutrition Assessment  DOCUMENTATION CODES:   Morbid obesity  INTERVENTION:  If enteral nutrition is warranted, recommend Jevity 1.2 @ 20 ml/hr via tube and increase by 10 ml every 4 hours to goal rate of 60 ml/hr with 30 ml Prostat BID which will provide 1928 kcal (100% of needs), 110 grams of protein, and 1166 ml of H2O.   RD to continue to monitor.   NUTRITION DIAGNOSIS:   Inadequate oral intake related to dysphagia as evidenced by meal completion < 50%.  GOAL:   Patient will meet greater than or equal to 90% of their needs  MONITOR:   PO intake, Weight trends, Labs, I & O's  REASON FOR ASSESSMENT:   Malnutrition Screening Tool    ASSESSMENT:   55 y.o. female presenting with cough, shortness of breath, and difficulty swallowing. PMH is significant for myasthenia gravis, HFpEF, splenomegaly, HTN, depression/anxiety.  Pt was in procedure during attempted time of visit. RD unable to obtain most recent nutrition history. Pt underwent FEES today. Per SLP note, pt with severe aspiration risk and with severe chronic dysphagia and reports pt will likely need long term alternate means of nutrition/PEG and recommends NPO. Plans for pt to get an esophagram and GI has been consulted. Per SLP note, pt reports she had little PO since Friday. Per RN pt has been on a full liquid diet at home and has been consuming broths, yogurts, and applesauce. Per Epic weight records, pt with a 7.8% weight loss in 2 months. RD has provided TF recommendations above if warranted. RD to continue to monitor.   Unable to complete Nutrition-Focused physical exam at this time. RD to revisit.  Labs and medications reviewed.   Diet Order:  DIET - DYS 1 Room service appropriate?: Yes; Fluid consistency:: Nectar Thick  Skin:  Reviewed, no issues  Last BM:  6/4  Height:   Ht Readings from Last 1 Encounters:  08/05/15 5' (1.524 m)    Weight:   Wt Readings from Last 1 Encounters:  08/05/15 238 lb  8 oz (108.183 kg)    Ideal Body Weight:  45.45 kg  BMI:  Body mass index is 46.58 kg/(m^2).  Estimated Nutritional Needs:   Kcal:  [id number]  Protein:  105-115 grams  Fluid:  1.8 - 2 L/day  EDUCATION NEEDS:   No education needs identified at this time  Corrin Parker, MS, RD, LDN Pager # [phone] After hours/ weekend pager # [phone]

## 2015-08-06 NOTE — Progress Notes (Signed)
   08/06/15 0800  SLP G-Codes **NOT FOR INPATIENT CLASS**  Functional Assessment Tool Used (clinical judgement)  Functional Limitations Swallowing  Swallow Current Status [id number]) CK  Swallow Goal Status [id number]) CJ  SLP Evaluations  $ SLP Speech Visit 1 Procedure  SLP Evaluations  $BSS Swallow 1 Procedure  $Swallowing Treatment 1 Procedure

## 2015-08-06 NOTE — Procedures (Signed)
Objective Swallowing Evaluation: Type of Study: FEES-Fiberoptic Endoscopic Evaluation of Swallow  Patient Details  Name: Frances Medina MRN: [id number] Date of Birth: 28-Oct-1960  Today's Date: 08/06/2015 Time: SLP Start Time (ACUTE ONLY): 1330-SLP Stop Time (ACUTE ONLY): 1430 SLP Time Calculation (min) (ACUTE ONLY): 60 min  Past Medical History:  Past Medical History  Diagnosis Date  . GERD (gastroesophageal reflux disease)   . 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 Knee injury   . Depression   . Seasonal allergies     takes Zytrec  . Fibroid uterus     size of a dime  . Umbilical hernia     watching , no plans for surgery at present  . H/O hiatal hernia   . Hypertension     "went away when I stopped smoking"  . Anxiety   . Migraine     "maybe couple times/month" (03/20/2015)  . Arthritis     "knees" (03/20/2015)  . Osteoarthritis of right knee 08/30/2013  . Osteoarthritis of left knee 12/02/2013  . Myasthenia gravis ([place]) 2017  . E. coli UTI 04/07/2015   Past Surgical History:  Past Surgical History  Procedure Laterality Date  . Cesarean section  1987; 1989  . Dilation and curettage of uterus    . Tubal ligation  1989  . Vaginal hysterectomy  1990's?    "apparently took out one of my ovaries at the time too cause one's missing"  . Partial knee arthroplasty Right 08/30/2013    Procedure: RIGHT UNICOMPARTMENTAL KNEE;  Surgeon: Johnny Bridge, MD;  Location: [facility];  Service: Orthopedics;  Laterality: Right;  . Colonoscopy with propofol N/A 09/19/2013    Procedure: COLONOSCOPY WITH PROPOFOL;  Surgeon: Ladene Artist, MD;  Location: [facility];  Service: Endoscopy;  Laterality: N/A;  . Partial knee arthroplasty Left 12/02/2013    Procedure: LEFT KNEE UNI ARTHROPLASTY;  Surgeon: Johnny Bridge, MD;  Location: [facility];  Service: Orthopedics;  Laterality: Left;  . Esophagogastroduodenoscopy (egd) with propofol N/A 03/21/2015    Procedure: ESOPHAGOGASTRODUODENOSCOPY (EGD)  WITH PROPOFOL;  Surgeon: Gatha Mayer, MD;  Location: [facility];  Service: Endoscopy;  Laterality: N/A;   HPI: Frances Medina is a 55 y.o. female presenting with cough, shortness of breath, and difficulty swallowing. PMH is significant for myasthenia gravis, HFpEF, splenomegaly, HTN, depression/anxiety.  Pt was seen in April 2017 for an outpatient MBS after a noticable decline in swallowing at home. Pt was recommended to consume nectar and pureed foods with teaspoon bites/sips, a chin tuck and multiple swallow with concern for ability to tolerate given easy fatigue. Pt was seen last week at OP SLP who reinforced need to thicken ALL liquids to nectar consistency. Pt had a FEES in April showing partially incomplete glottic closure following prolonged intubation.   Subjective: pt alert in chair, frustrated with current situation, says she has hardly had PO intake since Friday  Assessment / Plan / Recommendation  CHL IP CLINICAL IMPRESSIONS 08/06/2015  Therapy Diagnosis Severe pharyngeal phase dysphagia;Severe cervical esophageal phase dysphagia  Clinical Impression Pt demonstrates three distinct impairments affecting safety with swallowing. Her hyolaryngeal mobility continues to be weak with inefficient peristalsis of bolus and incomplete airway closure leading to penetration to the vesitibule during the swallow and diffuse residuals requiring multiple weak swallows to transit each bite/sip. Her second impairment is persistent incomplete glottic closure. Her arytenoids adduct symmetrically, and her vocal folds are healthy in appearance except for one small  chink in the posterior portion of the right fold. When asked to hold breath, incomplete closure is visible and air escapes through the glottis. This incomplete closure allows for aspiration of the penetrated liquid during the swallow, which pt does not immediately sense. The quantity of immediate aspiration is minimal, and when eventually sensed leads to  coughing. This then leads to the thrid and most significant impairment, which is severe backflow from the cervical esophagus during any coughing, which the pt then aspirates leading to a cycle of coughing and gross aspiration.   Given the severity of deficits, pt is at very high risk of aspiration even with minimal PO intake and with modified textures. Suggest referal to GI most importantly for esophageal work up and consideration for long term alternate means of nutrition. This pt has a severe chronic dysphagia, already takes minimal amount of PO and struggles to nutritionally support herself. She could benefit from ongoing SLP therapy for pharyngeal strengthening exercises and outpatient consult with ENT regarding glottal incompetence to regain functional swallow. However any PO intake will come with high risk of aspiration. Discussed at length with pt and MD. Will follow acutely for pharyngeal strengthening with ice chips and education as needed.   Impact on safety and function Severe aspiration risk      CHL IP TREATMENT RECOMMENDATION 08/06/2015  Treatment Recommendations Therapy as outlined in treatment plan below     Prognosis 08/06/2015  Prognosis for Safe Diet Advancement Fair  Barriers to Reach Goals Severity of deficits  Barriers/Prognosis Comment --    CHL IP DIET RECOMMENDATION 08/06/2015  SLP Diet Recommendations NPO;Alternative means - long-term;Ice chips PRN after oral care  Liquid Administration via --  Medication Administration Crushed with puree  Compensations Slow rate;Small sips/bites;Multiple dry swallows after each bite/sip  Postural Changes --      CHL IP OTHER RECOMMENDATIONS 08/06/2015  Recommended Consults Consider ENT evaluation;Consider GI evaluation;Consider esophageal assessment  Oral Care Recommendations Oral care BID  Other Recommendations Have oral suction available      CHL IP FOLLOW UP RECOMMENDATIONS 08/06/2015  Follow up Recommendations Outpatient SLP       CHL IP FREQUENCY AND DURATION 08/06/2015  Speech Therapy Frequency (ACUTE ONLY) min 2x/week  Treatment Duration 2 weeks           CHL IP ORAL PHASE 08/06/2015  Oral Phase WFL  Oral - Pudding Teaspoon --  Oral - Pudding Cup --  Oral - Honey Teaspoon --  Oral - Honey Cup --  Oral - Nectar Teaspoon --  Oral - Nectar Cup --  Oral - Nectar Straw --  Oral - Thin Teaspoon --  Oral - Thin Cup --  Oral - Thin Straw --  Oral - Puree --  Oral - Mech Soft --  Oral - Regular --  Oral - Multi-Consistency --  Oral - Pill --  Oral Phase - Comment --    CHL IP PHARYNGEAL PHASE 08/06/2015  Pharyngeal Phase Impaired  Pharyngeal- Pudding Teaspoon --  Pharyngeal --  Pharyngeal- Pudding Cup --  Pharyngeal --  Pharyngeal- Honey Teaspoon --  Pharyngeal --  Pharyngeal- Honey Cup --  Pharyngeal --  Pharyngeal- Nectar Teaspoon --  Pharyngeal --  Pharyngeal- Nectar Cup Delayed swallow initiation-vallecula;Reduced epiglottic inversion;Reduced anterior laryngeal mobility;Reduced laryngeal elevation;Reduced airway/laryngeal closure;Reduced tongue base retraction;Penetration/Aspiration during swallow;Penetration/Aspiration before swallow;Significant aspiration (Amount);Pharyngeal residue - pyriform;Pharyngeal residue - valleculae;Lateral channel residue  Pharyngeal Material enters airway, passes BELOW cords without attempt by patient to eject out (silent aspiration);Material enters  airway, CONTACTS cords and not ejected out  Pharyngeal- Nectar Straw --  Pharyngeal --  Pharyngeal- Thin Teaspoon --  Pharyngeal --  Pharyngeal- Thin Cup Delayed swallow initiation-vallecula;Reduced epiglottic inversion;Reduced anterior laryngeal mobility;Reduced laryngeal elevation;Reduced airway/laryngeal closure;Reduced tongue base retraction;Penetration/Aspiration during swallow;Penetration/Aspiration before swallow;Significant aspiration (Amount);Pharyngeal residue - pyriform;Pharyngeal residue - valleculae;Lateral channel  residue  Pharyngeal Material enters airway, passes BELOW cords without attempt by patient to eject out (silent aspiration);Material enters airway, CONTACTS cords and not ejected out  Pharyngeal- Thin Straw --  Pharyngeal --  Pharyngeal- Puree Delayed swallow initiation-vallecula;Reduced epiglottic inversion;Reduced anterior laryngeal mobility;Reduced laryngeal elevation;Reduced airway/laryngeal closure;Reduced tongue base retraction;Lateral channel residue;Penetration/Aspiration before swallow;Pharyngeal residue - valleculae;Pharyngeal residue - pyriform  Pharyngeal --  Pharyngeal- Mechanical Soft --  Pharyngeal --  Pharyngeal- Regular --  Pharyngeal --  Pharyngeal- Multi-consistency --  Pharyngeal --  Pharyngeal- Pill --  Pharyngeal --  Pharyngeal Comment --     CHL IP CERVICAL ESOPHAGEAL PHASE 06/29/2015  Cervical Esophageal Phase WFL  Pudding Teaspoon --  Pudding Cup --  Honey Teaspoon --  Honey Cup --  Nectar Teaspoon --  Nectar Cup --  Nectar Straw --  Thin Teaspoon --  Thin Cup --  Thin Straw --  Puree --  Mechanical Soft --  Regular --  Multi-consistency --  Pill --  Cervical Esophageal Comment --    CHL IP GO 08/06/2015  Functional Assessment Tool Used (No Data)  Functional Limitations Swallowing  Swallow Current Status [id number]) CK  Swallow Goal Status [id number]) CJ  Swallow Discharge Status [id number]) (None)  Motor Speech Current Status [id number]) (None)  Motor Speech Goal Status UK:060616) (None)  Motor Speech Goal Status SA:931536) (None)  Spoken Language Comprehension Current Status MZ:5018135) (None)  Spoken Language Comprehension Goal Status YD:[id number]) (None)  Spoken Language Comprehension Discharge Status UF:4533880) (None)  Spoken Language Expression Current Status FP:837989) (None)  Spoken Language Expression Goal Status LT:9098795) (None)  Spoken Language Expression Discharge Status [phone]) (None)  Attention Current Status [id number]:1732502) (None)  Attention Goal Status EY:7266000) (None)   Attention Discharge Status PJ:4613913) (None)  Memory Current Status YL:3545582) (None)  Memory Goal Status CF:3682075) (None)  Memory Discharge Status QC:115444) (None)  Voice Current Status [id number]) (None)  Voice Goal Status EW:8517110) (None)  Voice Discharge Status JH:9561856) (None)  Other Speech-Language Pathology Functional Limitation UC:978821) (None)  Other Speech-Language Pathology Functional Limitation Goal Status XD:1448828) (None)  Other Speech-Language Pathology Functional Limitation Discharge Status [phone]) (None)   Herbie Baltimore, MA CCC-SLP [phone]  Lela Gell, [name] 08/06/2015, 2:38 PM

## 2015-08-06 NOTE — Progress Notes (Signed)
Pt. perfomed -57 on the NIF.

## 2015-08-06 NOTE — Evaluation (Signed)
Clinical/Bedside Swallow Evaluation Patient Details  Name: Frances Medina MRN: [id number] Date of Birth: 11-16-1960  Today's Date: 08/06/2015 Time: SLP Start Time (ACUTE ONLY): 0850 SLP Stop Time (ACUTE ONLY): 0913 SLP Time Calculation (min) (ACUTE ONLY): 23 min  Past Medical History:  Past Medical History  Diagnosis Date  . GERD (gastroesophageal reflux disease)   . 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 Knee injury   . Depression   . Seasonal allergies     takes Zytrec  . Fibroid uterus     size of a dime  . Umbilical hernia     watching , no plans for surgery at present  . H/O hiatal hernia   . Hypertension     "went away when I stopped smoking"  . Anxiety   . Migraine     "maybe couple times/month" (03/20/2015)  . Arthritis     "knees" (03/20/2015)  . Osteoarthritis of right knee 08/30/2013  . Osteoarthritis of left knee 12/02/2013  . Myasthenia gravis ([place]) 2017  . E. coli UTI 04/07/2015   Past Surgical History:  Past Surgical History  Procedure Laterality Date  . Cesarean section  1987; 1989  . Dilation and curettage of uterus    . Tubal ligation  1989  . Vaginal hysterectomy  1990's?    "apparently took out one of my ovaries at the time too cause one's missing"  . Partial knee arthroplasty Right 08/30/2013    Procedure: RIGHT UNICOMPARTMENTAL KNEE;  Surgeon: Johnny Bridge, MD;  Location: [facility];  Service: Orthopedics;  Laterality: Right;  . Colonoscopy with propofol N/A 09/19/2013    Procedure: COLONOSCOPY WITH PROPOFOL;  Surgeon: Ladene Artist, MD;  Location: [facility];  Service: Endoscopy;  Laterality: N/A;  . Partial knee arthroplasty Left 12/02/2013    Procedure: LEFT KNEE UNI ARTHROPLASTY;  Surgeon: Johnny Bridge, MD;  Location: [facility];  Service: Orthopedics;  Laterality: Left;  . Esophagogastroduodenoscopy (egd) with propofol N/A 03/21/2015    Procedure: ESOPHAGOGASTRODUODENOSCOPY (EGD) WITH PROPOFOL;  Surgeon: Gatha Mayer, MD;  Location: [facility];  Service: Endoscopy;  Laterality: N/A;   HPI:  Frances Medina is a 55 y.o. female presenting with cough, shortness of breath, and difficulty swallowing. PMH is significant for myasthenia gravis, HFpEF, splenomegaly, HTN, depression/anxiety.  Pt was seen in April 2017 for an outpatient MBS after a noticable decline in swallowing at home. Pt was recommended to consume nectar and pureed foods with teaspoon bites/sips, a chin tuck and multiple swallow with concern for ability to tolerate given easy fatigue. Pt was seen last week at OP SLP who reinforced need to thicken ALL liquids to nectar consistency. Pt had a FEES in April showing partially incomplete glottic closure following prolonged intubation.    Assessment / Plan / Recommendation Clinical Impression  Pt demonstrates ongoing concern for aspiration due to chronic dysphagia. Pt reports possible inconsistent thickening of liquids at home. Pts strength does appear improved since last MBS with pt only completing 1-2 swallows instead of 7-8, though concerned this could be due to lack of sensation with ongoing severe residuals that pt does not make the effort to clear. She is also noted to have persistent hoarse vocal quality, which was observed in April and attributed to prolonged intubation. This should have improved by now, so objective view ot airway during swallow assessment would be beneficial to determine if ENT referral is required. Will proceed with FEES today to determine best  texture choice and aid in decision making in the setting of probable aspiration pna.     Aspiration Risk  Severe aspiration risk    Diet Recommendation Dysphagia 1 (Puree);Nectar-thick liquid   Liquid Administration via: Cup;Spoon Medication Administration: Whole meds with liquid Supervision: Patient able to self feed Compensations: Slow rate;Small sips/bites;Multiple dry swallows after each bite/sip Postural Changes: Seated upright at 90 degrees;Remain  upright for at least 30 minutes after po intake    Other  Recommendations Recommended Consults: Consider ENT evaluation Oral Care Recommendations: Oral care BID Other Recommendations: Order thickener from pharmacy   Follow up Recommendations  24 hour supervision/assistance;Outpatient SLP    Frequency and Duration min 2x/week  2 weeks       Prognosis Prognosis for Safe Diet Advancement: Fair Barriers to Reach Goals: Severity of deficits      Swallow Study   General HPI: Frances Medina is a 55 y.o. female presenting with cough, shortness of breath, and difficulty swallowing. PMH is significant for myasthenia gravis, HFpEF, splenomegaly, HTN, depression/anxiety.  Pt was seen in April 2017 for an outpatient MBS after a noticable decline in swallowing at home. Pt was recommended to consume nectar and pureed foods with teaspoon bites/sips, a chin tuck and multiple swallow with concern for ability to tolerate given easy fatigue. Pt was seen last week at OP SLP who reinforced need to thicken ALL liquids to nectar consistency. Pt had a FEES in April showing partially incomplete glottic closure following prolonged intubation.  Type of Study: Bedside Swallow Evaluation Diet Prior to this Study: NPO Temperature Spikes Noted: No Respiratory Status: Nasal cannula History of Recent Intubation: No Behavior/Cognition: Alert;Cooperative;Pleasant mood Oral Cavity Assessment: Within Functional Limits Oral Care Completed by SLP: No Oral Cavity - Dentition: Adequate natural dentition Vision: Functional for self-feeding Self-Feeding Abilities: Able to feed self Patient Positioning: Upright in chair Baseline Vocal Quality: Breathy;Hoarse Volitional Cough: Strong;Congested Volitional Swallow: Able to elicit    Oral/Motor/Sensory Function Overall Oral Motor/Sensory Function: Within functional limits   Ice Chips Ice chips: Not tested   Thin Liquid Thin Liquid: Not tested    Nectar Thick Nectar Thick  Liquid: Impaired Pharyngeal Phase Impairments: Decreased hyoid-laryngeal movement   Honey Thick Honey Thick Liquid: Not tested   Puree Puree: Not tested   Solid   GO   Solid: Not tested       Herbie Baltimore, MA CCC-SLP [phone]  Lynann Beaver 08/06/2015,9:26 AM

## 2015-08-06 NOTE — Progress Notes (Signed)
NIF -60cmh20 with excellent effort

## 2015-08-07 ENCOUNTER — Ambulatory Visit: Payer: BC Managed Care – PPO | Admitting: Speech Pathology

## 2015-08-07 DIAGNOSIS — E43 Unspecified severe protein-calorie malnutrition: Secondary | ICD-10-CM

## 2015-08-07 DIAGNOSIS — R1310 Dysphagia, unspecified: Secondary | ICD-10-CM | POA: Diagnosis not present

## 2015-08-07 DIAGNOSIS — J9601 Acute respiratory failure with hypoxia: Secondary | ICD-10-CM | POA: Diagnosis not present

## 2015-08-07 DIAGNOSIS — T17998A Other foreign object in respiratory tract, part unspecified causing other injury, initial encounter: Secondary | ICD-10-CM | POA: Diagnosis not present

## 2015-08-07 HISTORY — DX: Unspecified severe protein-calorie malnutrition: E43

## 2015-08-07 LAB — CBC
HCT: 41.7 % (ref 36.0–46.0)
Hemoglobin: 12.4 g/dL (ref 12.0–15.0)
MCH: 23.1 pg — ABNORMAL LOW (ref 26.0–34.0)
MCHC: 29.7 g/dL — ABNORMAL LOW (ref 30.0–36.0)
MCV: 77.7 fL — ABNORMAL LOW (ref 78.0–100.0)
Platelets: 258 10*3/uL (ref 150–400)
RBC: 5.37 MIL/uL — ABNORMAL HIGH (ref 3.87–5.11)
RDW: 17.6 % — ABNORMAL HIGH (ref 11.5–15.5)
WBC: 5.9 10*3/uL (ref 4.0–10.5)

## 2015-08-07 LAB — BASIC METABOLIC PANEL
Anion gap: 6 (ref 5–15)
BUN: <5 mg/dL — ABNORMAL LOW (ref 6–20)
CO2: 30 mmol/L (ref 22–32)
Calcium: 9.1 mg/dL (ref 8.9–10.3)
Chloride: 106 mmol/L (ref 101–111)
Creatinine, Ser: 0.51 mg/dL (ref 0.44–1.00)
GFR calc Af Amer: >60 mL/min (ref >60)
GFR calc non Af Amer: >60 mL/min (ref >60)
Glucose, Bld: 97 mg/dL (ref 65–99)
Potassium: 3.7 mmol/L (ref 3.5–5.1)
Sodium: 142 mmol/L (ref 135–145)

## 2015-08-07 MED ORDER — TRAZODONE HCL 100 MG PO TABS
100.0000 mg | ORAL_TABLET | Freq: Every day | ORAL | Status: DC
  Administered 2015-08-07: 100 mg via ORAL
  Filled 2015-08-07: qty 1

## 2015-08-07 MED ORDER — FAMOTIDINE 20 MG PO TABS
20.0000 mg | ORAL_TABLET | Freq: Two times a day (BID) | ORAL | Status: DC
  Administered 2015-08-07 – 2015-08-11 (×7): 20 mg via ORAL
  Filled 2015-08-07 (×7): qty 1

## 2015-08-07 MED ORDER — BOOST PLUS PO LIQD
237.0000 mL | Freq: Three times a day (TID) | ORAL | Status: DC
  Administered 2015-08-08 – 2015-08-11 (×5): 237 mL via ORAL
  Filled 2015-08-07 (×16): qty 237

## 2015-08-07 MED ORDER — AZITHROMYCIN 250 MG PO TABS
250.0000 mg | ORAL_TABLET | Freq: Every day | ORAL | Status: AC
  Administered 2015-08-08 – 2015-08-09 (×2): 250 mg via ORAL
  Filled 2015-08-07 (×4): qty 1

## 2015-08-07 NOTE — Progress Notes (Signed)
   08/07/15 1800  Clinical Encounter Type  Visited With Patient;Family;Patient and family together  Visit Type Initial;Psychological support;Spiritual support;Social support  Referral From Nurse  Stress Factors  Patient Stress Factors Health changes;Loss of control;Major life changes  CH requested to visit with pt who is going through major transition; pt mentioned her depression and anxiety and talked about her husband, children and grandchildren as motivation for upcoming procedure; Pt aware and has accepted the limitations of trach and feeding tube and Ch was able to discuss quality of her life moving forward; [place] offered spiritual support, prayer and is available as needed. Gwynn Burly 6:29 PM

## 2015-08-07 NOTE — Progress Notes (Signed)
Patient was seen by me yesterday at 11am. Recent admission for desaturation and difficulty swallowing. She stated she feels much better now. She was wondering if her myasthenia gravis is worsening causing her poor oxygenation. I mentioned to her that the FMTS team are wondering if she might have COPD given hx of tobacco smoking and associated wheezing,. As discussed with her after hospital discharge I will likely refer her for PFT. All questions answered. I appreciate the excellent care provided by FMTS. I will see her for hospital follow up upon discharge.

## 2015-08-07 NOTE — Progress Notes (Signed)
Family Medicine Teaching Service Daily Progress Note Intern Pager: [phone]  Patient name: Frances Medina Medical record number: [id number] Date of birth: [birth year] Age: 55 y.o. Gender: female  Primary Care Provider: Andrena Mews, MD Consultants: None Code Status: Full  Pt Overview and Major Events to Date:  6/4: Admitted to FMTS with shortness of breath, likely 2/2 to chronic aspiration.  Assessment and Plan: Frances Medina is a 55 y.o. female presenting with cough, shortness of breath, and difficulty swallowing. PMH is significant for myasthenia gravis, HFpEF, splenomegaly, HTN, depression/anxiety.  GI: Severe chronic dysphagia: FEES testing performed by SLP on 6/5 showed severe backflow from the cervical esophagus, leading to gross aspiration. Esophagram (6/5) showed no esophageal obstruction or narrowing, decreased mid and distal esophageal motility with frequent to and fro motion of retained contrast in the cervical and proximal thoracic esophagus, and reflux of the contrast from the esophagus into the hypopharynx. - Dysphagia I diet - GI consult per SLP recommendations for esophageal work-up. May need to get ENT on board as well. - Will follow-up additional SLP recommendations. - May need to consider PEG tube  Resp: Chronic aspiration pneumonia/pneumonitis: Stable on room air overnight. On exam, she continues to have diffuse expiratory wheezing with normal work of breathing. - Azithromycin daily x 5 days  - Continue Prednisone 40mg  daily x 5 days. - Have been getting NIFs q12 hours. All have been normal. Will d/c today. - Blood cultures drawn in ED showing NG x < 24 hours. - Continue scheduled Albuterol q6hrs. - Would still recommend PFTs as outpatient, especially given her 20 pack year smoking history. - PT/OT consult- no PT f/u - Cardiac monitoring, continuous pulse ox  Neuro: Myasthenia Gravis: Stable. Follows with Dr. Porfirio Oar (Neurology) at [facility] Also, just  started speech therapy for assistance with swallowing this past week. Pt states she is not having any weakness at this time. - Continue home med: Pyridostigmine 60mg  qid and 180mg  at bedtime - Speech consult, as above.  CV: HFpEF: Stable. ECHO 04/09/15 performed due to SOB showed EF of 60-65% and G1DD. Patient currently appears euvolemic. BNP 20 this admission. - Continue to monitor  Heme: Splenomegaly w/Lymphadenopathy: Being followed by Oncologist Dr. Irene Limbo. Work-up thus far has shown HIV neg, mono screen neg, elevated LDH at 395 and low haptoglobin at <10. Lymphoma ruled out with PET scan performed on 3/13. - Continue to monitor CBCs   Hx of Normocytic Anemia: Also followed by Dr. Irene Limbo (heme-onc). Last anemia panel 04/2015: Iron 76 (nl), TIBC 286 (nl), ferritin 688 (high). Has had elevated LDH, low haptoglobin, increased reticulocyte count, and spherocytes on peripheral blood smear in the past. 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 Hgb 13.1 in the ED > 11.8 this morning, likely dilutional with drops in all cell lines. MCV 78. - Continue home ferrous sulfate 325mg  bid - Miralax prn - Daily CBCs  History of HTN: BP 149/95 this morning, but previously 115/80. - Not on any home anti-hypertensives - Continue to monitor. Will add prn if needed.   Seasonal allergies: Pt endorses rhinorrhea, itchy eyes, sore throat - Will start Claritin and ocean nasal spray  Psych: Depression and Anxiety: Stable - Zoloft 50mg  daily, Klonopin 0.5-1mg  bid prn  GERD: - Continue home Pepcid  FEN/GI: Dysphagia I diet per speech recommendations. Prophylaxis: Lovenox  Disposition: Pending GI and SLP recs. Pt would like to go home today if possible.  Subjective:  Pt states her breathing continues to get better. She  would really like to go home today. She has no concerns.  Objective: Temp:  [98.1 F (36.7 C)-98.3 F (36.8 C)] 98.1 F (36.7 C) (06/05 2008) Pulse Rate:  [79-82] 79 (06/05 2008) Resp:   [18-20] 18 (06/05 2008) BP: (115-141)/(80-95) 141/95 mmHg (06/05 2008) SpO2:  [90 %-98 %] 93 % (06/05 2139) Physical Exam: General: Well-appearing female, sitting up in chair, in NAD, pleasant HEENT: [place]/AT, EOMI,  MMM, hoarse voice. Neck: Supple, no lymphadenopathy Cardiovascular: RRR, no murmurs, 2+ DP pulses Respiratory: Normal work of breathing, diffuse expiratory wheezing in all lung fields Abdomen: +BS, soft, non-tender, non-distended MSK: No edema or cyanosis Skin: No rashes, no lesions Neuro: Awake, alert, oriented, CN 2-12 grossly intact, no focal deficits. Psych: Appropriate affect, normal behavior.  Laboratory:  Recent Labs Lab 08/05/15 1022 08/06/15 0643  WBC 9.4 6.6  HGB 13.1 11.8*  HCT 42.8 40.4  PLT 273 237    Recent Labs Lab 08/05/15 1022 08/06/15 0643 08/07/15 0435  NA 138 141 142  K 3.7 3.1* 3.7  CL 102 104 106  CO2 28 29 30   BUN 9 <5* <5*  CREATININE 0.68 0.63 0.51  CALCIUM 9.0 8.8* 9.1  PROT 6.6  --   --   BILITOT 0.4  --   --   ALKPHOS 76  --   --   ALT 23  --   --   AST 22  --   --   GLUCOSE 132* 105* 97   BNP 20  Imaging/Diagnostic Tests: -CXR (6/4): Bibasilar atelectasis, low lung volumes -Esophagram (6/5): No esophageal obstruction or narrowing. A 12.5 mm barium tablet passed freely to the stomach without delay, Decreased mid and distal esophageal motility with frequent to and fro motion of retained contrast in the cervical and proximal thoracic esophagus. While prone reflux of this contrast from the esophagus into the hypopharynx was observed, Intermittent small volume of barium aspiration which in general did not elicit a cough, No gastroesophageal reflux occurred spontaneously or was elicited.  Sela Hua, MD 08/07/2015, 7:06 AM PGY-1, [facility] Intern pager: [phone], text pages welcome

## 2015-08-07 NOTE — Progress Notes (Signed)
Pt performed NIF x2 best -60. Pt tolerated well with good effort.

## 2015-08-07 NOTE — Progress Notes (Signed)
Physical Therapy Treatment Patient Details Name: Frances Medina MRN: [id number] DOB: 1960/06/20 Today's Date: 08/07/2015    History of Present Illness Pt is a 56 y/o F presenting w/ coug, SOB, and difficulty swallowing.  Chest x-ray showed bibasilar atelectasis.  Pt's PMH includes myasthenia gravis, splenomegaly, depression, anxiety, intubated during last admission, diplopia, anemia, obesity, Lt and Rt partial knee arthroplasty.    PT Comments    Pt is making good progress, she demo's incr tolerance to activity today but does fatigue after amb  ~150' and requires sitting rest; discussed awareness of this at home as well importance of ramping up activity as able  Follow Up Recommendations  No PT follow up;Supervision - Intermittent     Equipment Recommendations  None recommended by PT    Recommendations for Other Services       Precautions / Restrictions Precautions Precautions: Fall Precaution Comments: monitor O2/HR Restrictions Weight Bearing Restrictions: No    Mobility  Bed Mobility                  Transfers   Equipment used: None Transfers: Sit to/from Stand Sit to Stand: Supervision         General transfer comment: Supervision for safety  Ambulation/Gait Ambulation/Gait assistance: Supervision Ambulation Distance (Feet): 150 Feet (x2) Assistive device: None Gait Pattern/deviations: Step-through pattern;Decreased stride length;Wide base of support     General Gait Details: supervision for safety, pt improving but does fatigue quickly; sitting rest between 15' distances; HR max 127, O2 sats 90-92% on RA during mobility   Stairs            Wheelchair Mobility    Modified Rankin (Stroke Patients Only)       Balance             Standing balance-Leahy Scale: Good               High level balance activites: Backward walking;Direction changes;Turns;Sudden stops;Head turns High Level Balance Comments: pt continues with steady  but slow gait; no LOB with activities but continues to fatigue and require incr time    Cognition Arousal/Alertness: Awake/alert Behavior During Therapy: WFL for tasks assessed/performed Overall Cognitive Status: Within Functional Limits for tasks assessed                      Exercises      General Comments        Pertinent Vitals/Pain Pain Assessment: No/denies pain    Home Living                      Prior Function            PT Goals (current goals can now be found in the care plan section) Acute Rehab PT Goals Patient Stated Goal: go home today PT Goal Formulation: With patient/family Time For Goal Achievement: 08/19/15 Potential to Achieve Goals: Good Progress towards PT goals: Progressing toward goals    Frequency  Min 3X/week    PT Plan Current plan remains appropriate    Co-evaluation             End of Session Equipment Utilized During Treatment: Gait belt Activity Tolerance: Patient tolerated treatment well Patient left: in chair;with call bell/phone within reach     Time: 0952-1008 PT Time Calculation (min) (ACUTE ONLY): 16 min  Charges:  $Gait Training: 8-22 mins  G CodesKenyon Ana 08/07/2015, 10:22 AM

## 2015-08-07 NOTE — Progress Notes (Addendum)
Speech Language Pathology Treatment: Dysphagia  Patient Details Name: Frances Medina MRN: [id number] DOB: 1960-11-09 Today's Date: 08/07/2015 Time: [id number] SLP Time Calculation (min) (ACUTE ONLY): 11 min  Assessment / Plan / Recommendation Clinical Impression  Results of yesterday's FEES reviewed with high aspiration risk and recommendation of NPO. MD initiated Dys 1 diet and nectar thick liquids. Per progress note, resident apparently attempted to contact Speech (Speech Pathologist performed yesterday's FEES is not working today). This SLP will attempt to contact her. FEES results reviewed with pt who appears to understand risk. Observed with 2 sips nectar thick liquid. Multiple swallows due to likely cervical esophageal backflow into pharynx. Pt asking questions re: PEG. Long term alternative means of nutrition would be beneficial for pt's primary nutritional support while continuing outpatient, primarily during times of Myasthenia Gravis exacerbation and decreased endurance and reserve. Educated pt on importance of swallow precautions.  Pt will most likely aspirate current diet/liquids per FEES results. Will follow up next date.   HPI HPI: Frances Medina is a 55 y.o. female presenting with cough, shortness of breath, and difficulty swallowing. PMH is significant for myasthenia gravis, HFpEF, splenomegaly, HTN, depression/anxiety.  Pt was seen in April 2017 for an outpatient MBS after a noticable decline in swallowing at home. Pt was recommended to consume nectar and pureed foods with teaspoon bites/sips, a chin tuck and multiple swallow with concern for ability to tolerate given easy fatigue. Pt was seen last week at OP SLP who reinforced need to thicken ALL liquids to nectar consistency. Pt had a FEES in April showing partially incomplete glottic closure following prolonged intubation.       SLP Plan  Continue with current plan of care     Recommendations  Diet recommendations: Dysphagia 1  (puree);Nectar-thick liquid Liquids provided via: Cup;No straw Medication Administration: Whole meds with puree Supervision: Patient able to self feed;Intermittent supervision to cue for compensatory strategies Compensations: Slow rate;Small sips/bites;Multiple dry swallows after each bite/sip;Hard cough after swallow Postural Changes and/or Swallow Maneuvers: Seated upright 90 degrees;Upright 30-60 min after meal             Oral Care Recommendations: Oral care BID Follow up Recommendations: Outpatient SLP Plan: Continue with current plan of care     GO                Houston Siren 08/07/2015, 2:51 PM   Orbie Pyo Colvin Caroli.Ed [employer] [phone]

## 2015-08-07 NOTE — Progress Notes (Signed)
Spoke with GI Jaci Standard, [place]) on the phone regarding the results of the esophagram performed yesterday. She states that there is nothing for GI to do. The esophageal dysmotility is likely secondary to Pt's myasthenia gravis. No need to do an endoscopy because the esophagram did not show stricture. If we need a PEG tube placed, they recommend calling IR for this.  Awaiting to hear back from SLP about discharge recommendations. We need to decide if she needs PEG this hospitalization versus continuing outpatient SLP with outpatient referral for PEG placement.  Hyman Bible, MD PGY-1

## 2015-08-07 NOTE — Progress Notes (Addendum)
Nutrition Follow-up  DOCUMENTATION CODES:   Severe malnutrition in context of chronic illness, Morbid obesity   Pt meets criteria for severe MALNUTRITION in the context of chronic illness as evidenced by 7.8% weight loss in 2 months and energy intake </= 75% for >/= 1 month.  INTERVENTION:  Provide Boost Plus PO TID (thickened to appropriate consistency), each supplement provides 360 kcal and 14 grams of protein.   Encourage adequate PO intake.   NUTRITION DIAGNOSIS:   Inadequate oral intake related to dysphagia as evidenced by meal completion < 50%; ongoing  GOAL:   Patient will meet greater than or equal to 90% of their needs; not met  MONITOR:   PO intake, Weight trends, Labs, I & O's  REASON FOR ASSESSMENT:   Malnutrition Screening Tool    ASSESSMENT:   55 y.o. female presenting with cough, shortness of breath, and difficulty swallowing. PMH is significant for myasthenia gravis, HFpEF, splenomegaly, HTN, depression/anxiety.  Pt reports she has only been consuming the applesauce from her trays. She reports having early satiety thus unable to eat a lot at one time. Pt reports she has been on a puree diet which has been ongoing for 2 months. Diet recall consists of applesauce and coffee for breakfast, and yogurt and Boost for later in the day. Pt reports a 50 lb weight loss since January. Per weight records, pt with a 7.8% weight loss in 2 months. RD to order Boost Plus to aid in caloric and protein needs. Per MD note, may need to consider PEG tube in future.   Pt with no observed significant fat or muscle mass loss.   Labs and medications reviewed.   Diet Order:  DIET - DYS 1 Room service appropriate?: Yes; Fluid consistency:: Nectar Thick  Skin:  Reviewed, no issues  Last BM:  6/5  Height:   Ht Readings from Last 1 Encounters:  08/05/15 5' (1.524 m)    Weight:   Wt Readings from Last 1 Encounters:  08/05/15 238 lb 8 oz (108.183 kg)    Ideal Body Weight:   45.45 kg  BMI:  Body mass index is 46.58 kg/(m^2).  Estimated Nutritional Needs:   Kcal:  6606-3016  Protein:  105-115 grams  Fluid:  1.8 - 2 L/day  EDUCATION NEEDS:   No education needs identified at this time  Corrin Parker, MS, RD, LDN Pager # [phone] After hours/ weekend pager # [phone]

## 2015-08-08 ENCOUNTER — Encounter (HOSPITAL_COMMUNITY): Payer: Self-pay | Admitting: Physician Assistant

## 2015-08-08 DIAGNOSIS — J9811 Atelectasis: Secondary | ICD-10-CM | POA: Diagnosis present

## 2015-08-08 DIAGNOSIS — I110 Hypertensive heart disease with heart failure: Secondary | ICD-10-CM | POA: Diagnosis present

## 2015-08-08 DIAGNOSIS — F419 Anxiety disorder, unspecified: Secondary | ICD-10-CM | POA: Diagnosis present

## 2015-08-08 DIAGNOSIS — E43 Unspecified severe protein-calorie malnutrition: Secondary | ICD-10-CM | POA: Diagnosis not present

## 2015-08-08 DIAGNOSIS — D592 Drug-induced nonautoimmune hemolytic anemia: Secondary | ICD-10-CM | POA: Diagnosis present

## 2015-08-08 DIAGNOSIS — J690 Pneumonitis due to inhalation of food and vomit: Secondary | ICD-10-CM | POA: Diagnosis present

## 2015-08-08 DIAGNOSIS — K429 Umbilical hernia without obstruction or gangrene: Secondary | ICD-10-CM | POA: Diagnosis present

## 2015-08-08 DIAGNOSIS — Z880 Allergy status to penicillin: Secondary | ICD-10-CM | POA: Diagnosis not present

## 2015-08-08 DIAGNOSIS — T17998A Other foreign object in respiratory tract, part unspecified causing other injury, initial encounter: Secondary | ICD-10-CM | POA: Diagnosis not present

## 2015-08-08 DIAGNOSIS — K224 Dyskinesia of esophagus: Secondary | ICD-10-CM

## 2015-08-08 DIAGNOSIS — J441 Chronic obstructive pulmonary disease with (acute) exacerbation: Secondary | ICD-10-CM | POA: Diagnosis present

## 2015-08-08 DIAGNOSIS — J302 Other seasonal allergic rhinitis: Secondary | ICD-10-CM | POA: Diagnosis present

## 2015-08-08 DIAGNOSIS — R633 Feeding difficulties: Secondary | ICD-10-CM | POA: Diagnosis not present

## 2015-08-08 DIAGNOSIS — E876 Hypokalemia: Secondary | ICD-10-CM | POA: Diagnosis present

## 2015-08-08 DIAGNOSIS — R1314 Dysphagia, pharyngoesophageal phase: Secondary | ICD-10-CM

## 2015-08-08 DIAGNOSIS — J40 Bronchitis, not specified as acute or chronic: Secondary | ICD-10-CM

## 2015-08-08 DIAGNOSIS — K219 Gastro-esophageal reflux disease without esophagitis: Secondary | ICD-10-CM | POA: Diagnosis present

## 2015-08-08 DIAGNOSIS — F329 Major depressive disorder, single episode, unspecified: Secondary | ICD-10-CM | POA: Diagnosis present

## 2015-08-08 DIAGNOSIS — Z9981 Dependence on supplemental oxygen: Secondary | ICD-10-CM | POA: Diagnosis not present

## 2015-08-08 DIAGNOSIS — R161 Splenomegaly, not elsewhere classified: Secondary | ICD-10-CM | POA: Diagnosis present

## 2015-08-08 DIAGNOSIS — J189 Pneumonia, unspecified organism: Secondary | ICD-10-CM | POA: Diagnosis not present

## 2015-08-08 DIAGNOSIS — G7000 Myasthenia gravis without (acute) exacerbation: Secondary | ICD-10-CM | POA: Diagnosis present

## 2015-08-08 DIAGNOSIS — R1310 Dysphagia, unspecified: Secondary | ICD-10-CM

## 2015-08-08 DIAGNOSIS — Z90710 Acquired absence of both cervix and uterus: Secondary | ICD-10-CM | POA: Diagnosis not present

## 2015-08-08 DIAGNOSIS — Z6841 Body Mass Index (BMI) 40.0 and over, adult: Secondary | ICD-10-CM | POA: Diagnosis not present

## 2015-08-08 DIAGNOSIS — R1313 Dysphagia, pharyngeal phase: Secondary | ICD-10-CM | POA: Diagnosis present

## 2015-08-08 DIAGNOSIS — I5032 Chronic diastolic (congestive) heart failure: Secondary | ICD-10-CM | POA: Diagnosis present

## 2015-08-08 DIAGNOSIS — T50995A Adverse effect of other drugs, medicaments and biological substances, initial encounter: Secondary | ICD-10-CM | POA: Diagnosis present

## 2015-08-08 DIAGNOSIS — Z87891 Personal history of nicotine dependence: Secondary | ICD-10-CM | POA: Diagnosis not present

## 2015-08-08 DIAGNOSIS — J9601 Acute respiratory failure with hypoxia: Secondary | ICD-10-CM | POA: Diagnosis present

## 2015-08-08 DIAGNOSIS — R591 Generalized enlarged lymph nodes: Secondary | ICD-10-CM | POA: Diagnosis present

## 2015-08-08 LAB — BLOOD CULTURE ID PANEL (REFLEXED)
Acinetobacter baumannii: NOT DETECTED
Candida albicans: NOT DETECTED
Candida glabrata: NOT DETECTED
Candida krusei: NOT DETECTED
Candida parapsilosis: NOT DETECTED
Candida tropicalis: NOT DETECTED
Carbapenem resistance: NOT DETECTED
Enterobacter cloacae complex: NOT DETECTED
Enterobacteriaceae species: NOT DETECTED
Enterococcus species: NOT DETECTED
Escherichia coli: NOT DETECTED
Haemophilus influenzae: NOT DETECTED
Klebsiella oxytoca: NOT DETECTED
Klebsiella pneumoniae: NOT DETECTED
Listeria monocytogenes: NOT DETECTED
Methicillin resistance: NOT DETECTED
Neisseria meningitidis: NOT DETECTED
Proteus species: NOT DETECTED
Pseudomonas aeruginosa: NOT DETECTED
Serratia marcescens: NOT DETECTED
Staphylococcus aureus (BCID): NOT DETECTED
Staphylococcus species: NOT DETECTED
Streptococcus agalactiae: NOT DETECTED
Streptococcus pneumoniae: NOT DETECTED
Streptococcus pyogenes: NOT DETECTED
Streptococcus species: NOT DETECTED
Vancomycin resistance: NOT DETECTED

## 2015-08-08 MED ORDER — ZOLPIDEM TARTRATE 5 MG PO TABS
5.0000 mg | ORAL_TABLET | Freq: Every day | ORAL | Status: DC
Start: 2015-08-08 — End: 2015-08-11
  Administered 2015-08-08 – 2015-08-10 (×3): 5 mg via ORAL
  Filled 2015-08-08 (×3): qty 1

## 2015-08-08 NOTE — Consult Note (Addendum)
[facility] Consult: 11:35 AM 08/08/2015     Referring Provider: Dr Erin Hearing  Primary Care Physician:  Andrena Mews, MD Primary Gastroenterologist:  Dr. Fuller Plan Neurologist: Dr. Audelia Acton of [facility]     Reason for Consultation:  PEG placement.    HPI: Frances Medina is a 55 y.o. female.  PMH morbid obesity. Anxiety.  Arthritis, s/p bil partial knee replacement. S/P multiple pelvic surgeries including tubal ligation, hysterectomy, C-section.  COPD, on chronic oxygen.  Diagnosed with Bell's Palsy in 02/02/2015 with persistent ptosis OS.  Ultimately diagnosed with myasthenia gravis during 03/2015 admission and treated with IVIG.  Pyloric channel ulcer 2012.   Dysphagia 03/2015 with  03/21/2015 EGD: mild antral gastritis, probably from Ibuprofen.  Empiric dilation of non-strictured esophagus encountering some resistance resulting in slight tear at proximal esophagus and some heme on dilator. 08/2013 Colonoscopy: avg risk screening. 4 sigmoid polyps 5 to 6 mm sized. 1 was TA, 3 were HP polyps.   04/2015 admission with dyspnea, myasthenia crisis.  Treated with IVIG, prednisone taper. .  Crisis may have been triggered by oupt Cipro used to treat UTI.  Note that she had nonspecific splenomegaly and thoracic lymphadenopathy noted on CT angiogram chest.  Dr Irene Limbo of hem-onc follows this and her previously normocytic anemia. Felt to have had hemolytic anemia secondary to IVIG.     3/14 - 06/08/15 admission with Myasthenia flare, resp failure requiring vent and ultimately trach placement after failing extubations.  NGT used for feeding.   Again treated with IVIG.  Treated for PNA, vent acquired and for E coli UTI.  At final SLP eval rec was for chopped/ D2 diet and nectar thick liquids.   08/05/15 admission with cough, dyspnea.   Hypoxemia.    BB atx on CXR.  Felt to have aspiration PNA.  On Prednisone, Azithromycin.   She reports difficulty swallowing thick sputum but no coughing/gagging with po meals.  FEES swallow study 08/06/15: "Severe pharyngeal phase dysphagia; severe cervical esophageal phase dysphagia".  High aspiration risk and recommended NPO.  IR unable to place G tube in next few days due to very busy schedule.  Therefore MDs requesting GI place PEG.  Pt is very eager to have feeding tube placed, except for oral meds she has been NPO since 6/4.      Past Medical History  Diagnosis Date  . GERD (gastroesophageal reflux disease)     hiatal hernia  . 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 Knee injury   . Depression with anxiety   . Seasonal allergies     takes Zytrec  . Fibroid uterus     size of a dime  . Umbilical hernia     watching , no plans for surgery at present  . Hypertension     "went away when I stopped smoking"  . Migraine     "maybe couple times/month" (03/20/2015)  . Osteoarthritis of right knee 08/30/2013  . Osteoarthritis of left knee 12/02/2013  . Myasthenia gravis ([place]) 2017  . E. coli UTI 04/07/2015  .  Dysphagia 03/2015     EGD, Dr Carlean Purl. mild antral gastritis, ? due to Ibuprofen.  no stricture but empirically maloney dilated esophagus.     Past Surgical History  Procedure Laterality Date  . Cesarean section  1987; 1989  . Dilation and curettage of uterus    . Tubal ligation  1989  . Vaginal hysterectomy  1990's?    "apparently took out one of my ovaries at the time too cause one's missing"  . Partial knee arthroplasty Right 08/30/2013    Procedure: RIGHT UNICOMPARTMENTAL KNEE;  Surgeon: Johnny Bridge, MD;  Location: [facility];  Service: Orthopedics;  Laterality: Right;  . Colonoscopy with propofol N/A 09/19/2013    Procedure: COLONOSCOPY WITH PROPOFOL;  Surgeon: Ladene Artist, MD;  Location: [facility];  Service: Endoscopy;  Laterality: N/A;  . Partial knee arthroplasty Left  12/02/2013    Procedure: LEFT KNEE UNI ARTHROPLASTY;  Surgeon: Johnny Bridge, MD;  Location: [facility];  Service: Orthopedics;  Laterality: Left;  . Esophagogastroduodenoscopy (egd) with propofol N/A 03/21/2015    Procedure: ESOPHAGOGASTRODUODENOSCOPY (EGD) WITH PROPOFOL;  Surgeon: Gatha Mayer, MD;  Location: [facility];  Service: Endoscopy;  Laterality: N/A;    Prior to Admission medications   Medication Sig Start Date End Date Taking? Authorizing Provider  albuterol (PROVENTIL HFA;VENTOLIN HFA) 108 (90 Base) MCG/ACT inhaler Inhale 2 puffs into the lungs every 4 (four) hours as needed for wheezing or shortness of breath. 04/07/15  Yes Shawn C Joy, PA-C  Chlorphen-PE-Acetaminophen (NOREL AD) 4-10-325 MG TABS Take 1 tablet by mouth 2 (two) times daily as needed. Reported on 06/18/2015   Yes Historical Provider, MD  clonazePAM (KLONOPIN) 1 MG tablet Take 0.5-1 tablets (0.5-1 mg total) by mouth 2 (two) times daily as needed for anxiety. 06/12/15  Yes [name], MD  estradiol (ESTRACE) 0.5 MG tablet Take 0.5 mg by mouth daily.   Yes Historical Provider, MD  famotidine (PEPCID) 20 MG tablet Take 1 tablet (20 mg total) by mouth 2 (two) times daily. 06/08/15  Yes Nicolette Bang, DO  ferrous sulfate 325 (65 FE) MG tablet Take 1 tablet (325 mg total) by mouth 2 (two) times daily with a meal. 04/07/15  Yes Shawn C Joy, PA-C  pyridostigmine (MESTINON) 180 MG CR tablet Take 180 mg by mouth at bedtime.   Yes Historical Provider, MD  pyridostigmine (MESTINON) 60 MG tablet Take 1 tablet (60 mg total) by mouth 4 (four) times daily. 06/08/15  Yes Nicolette Bang, DO  sertraline (ZOLOFT) 100 MG tablet Take 50 mg by mouth every morning.    Yes Historical Provider, MD  Vitamin D, Ergocalciferol, (DRISDOL) 50000 units CAPS capsule Take 1 capsule (50,000 Units total) by mouth every 7 (seven) days. 05/02/15  Yes [name], MD    Scheduled Meds: . azithromycin  250 mg Oral  Daily  . enoxaparin (LOVENOX) injection  40 mg Subcutaneous Q24H  . estradiol  0.5 mg Oral Daily  . famotidine  20 mg Oral BID  . ferrous sulfate  325 mg Oral BID WC  . ipratropium-albuterol  3 mL Nebulization TID  . lactose free nutrition  237 mL Oral TID WC  . loratadine  10 mg Oral Daily  . predniSONE  40 mg Oral Q breakfast  . pyridostigmine  180 mg Oral QHS  . pyridostigmine  60 mg Oral QID  . sertraline  50 mg Oral q morning - 10a  . sodium chloride flush  3  mL Intravenous Q12H  . traZODone  100 mg Oral QHS   Infusions: . dextrose 5 % and 0.9% NaCl 100 mL/hr at 08/07/15 0754   PRN Meds: acetaminophen **OR** acetaminophen, albuterol, clonazePAM, polyethylene glycol, RESOURCE THICKENUP CLEAR, sodium chloride   Allergies as of 08/05/2015 - Review Complete 08/05/2015  Allergen Reaction Noted  . Dicyclomine Hives 01/14/2011  . Methocarbamol Hives 09/19/2013  . Penicillins Hives 08/18/2013  . Ciprofloxacin  04/07/2015  . Shrimp [shellfish allergy]  04/05/2015    Family History  Problem Relation Age of Onset  . Heart disease Mother 55  . Hypertension Mother   . Stroke Father   . Hypertension Father   . Colon cancer Neg Hx   . Other Sister     Esophageal strictures  . Anemia Sister     Social History   Social History  . Marital Status: Married    Spouse Name: N/A  . Number of Children: N/A  . Years of Education: N/A   Occupational History  . Not on file.   Social History Main Topics  . Smoking status: Former Smoker -- 0.50 packs/day for 40 years    Types: Cigarettes    Quit date: 05/14/2012  . Smokeless tobacco: Never Used  . Alcohol Use: No  . Drug Use: No  . Sexual Activity: Not Currently    Birth Control/ Protection: Surgical   Other Topics Concern  . Not on file   Social History Narrative   Work or School: [employer] Situation: lives with husband and daughters      Spiritual Beliefs: Christian       Lifestyle: no regular exercise; trying to eat healthy             REVIEW OF SYSTEMS: Constitutional: 60 # weight loss over last several months.  No fatigue.  Some weakness ENT:  No nose bleeds.  Vocal cord dysfunction and associated diminished voice since intubation/trach.  Pulm:  Cough and dyspnea improved CV:  No palpitations, no LE edema.  GU:  No hematuria, no frequency GI:  No constipation, no rectal bleeding Heme:  Per HPI.  No unusual bleeding or bruising   Transfusions:  none  Neuro:  No headaches, no peripheral tingling or numbness Derm:  No itching, no rash or sores.  Endocrine:  No sweats or chills.  No polyuria or dysuria Immunization:  Reviewed.  Do not see documented pneumovax.  Travel:  None beyond local counties in last few months.    PHYSICAL EXAM: Vital signs in last 24 hours: Filed Vitals:   08/07/15 2025 08/08/15 0459  BP: 148/85 159/96  Pulse: 92 99  Temp: 98.1 F (36.7 C) 98.2 F (36.8 C)  Resp: 18 18   Wt Readings from Last 3 Encounters:  08/05/15 108.183 kg (238 lb 8 oz)  06/18/15 117.028 kg (258 lb)  06/08/15 116.393 kg (256 lb 9.6 oz)    General: pleasant, obese, comfortable AAF Head:  No asymmetry or swelling   Eyes:  No icterus or pallor. Do not appreciate ptosis, lid lag.   Ears:  Not HOH  Nose:  No discharge Mouth:  Clear and moist oral MM.   Tongue midline Neck:  No mass, no TMG.  No JVD Lungs:  Diminished BS bil in bases, right>> left. + cough.  Hoarse, diminished vocal strength. Heart: Refular, tachy.  No MRG.  S1/S2 present Abdomen:  Obese, soft, no mass.  No able to appreciate splenomegaly.  [name]  Rectal: deferred   Musc/Skeltl: no joint erythema or swelling>  Surgery scars at both knees.  Extremities:  No CCE.   Neurologic:  Fully alert.  Oriented x 3.  Moves all 4 limbs without tremor.  Did not strength test.   Skin:  No rash or sores Psych:  Pleasant, cooperative.  Calm but very eager to get PEG.    Intake/Output from  previous day: 06/06 0701 - 06/07 0700 In: 480 [P.O.:480] Out: -  Intake/Output this shift: Total I/O In: 240 [P.O.:240] Out: -   LAB RESULTS:  Recent Labs  08/06/15 0643 08/07/15 0904  WBC 6.6 5.9  HGB 11.8* 12.4  HCT 40.4 41.7  PLT 237 258   BMET Lab Results  Component Value Date   NA 142 08/07/2015   NA 141 08/06/2015   NA 138 08/05/2015   K 3.7 08/07/2015   K 3.1* 08/06/2015   K 3.7 08/05/2015   CL 106 08/07/2015   CL 104 08/06/2015   CL 102 08/05/2015   CO2 30 08/07/2015   CO2 29 08/06/2015   CO2 28 08/05/2015   GLUCOSE 97 08/07/2015   GLUCOSE 105* 08/06/2015   GLUCOSE 132* 08/05/2015   BUN <5* 08/07/2015   BUN <5* 08/06/2015   BUN 9 08/05/2015   CREATININE 0.51 08/07/2015   CREATININE 0.63 08/06/2015   CREATININE 0.68 08/05/2015   CALCIUM 9.1 08/07/2015   CALCIUM 8.8* 08/06/2015   CALCIUM 9.0 08/05/2015     RADIOLOGY STUDIES: Dg Esophagus W/water Sol Cm  08/06/2015  CLINICAL DATA:  55 year old female with abnormal speech pathology evaluation. Aspiration, but also frequent reflux observed endoscopically from the cervical esophagus into the hypopharynx. Initial encounter. EXAM: ESOPHOGRAM/BARIUM SWALLOW TECHNIQUE: Single contrast examination was performed using  thin barium. FLUOROSCOPY TIME:  Radiation Exposure Index (as provided by the fluoroscopic device): If the device does not provide the exposure index: Fluoroscopy Time:  2 minutes 24 seconds COMPARISON:  Chest CTA 04/07/2015. FINDINGS: No obstruction to the forward flow of contrast throughout the esophagus and into the stomach. Normal esophageal course and contour. There is mild tapering of the distal esophagus as it approaches the gastroesophageal junction, however, a 12.5 mm barium tablet was administered and passed freely into the stomach without delay. With prone swallows decreased mid and distal esophageal motility was noted, with proximal release of contrast and frequent - nearly constant - to and  fro motion of of retained thin barium in the upper thoracic esophagus (series 10). Reflux of contrast from the distal cervical esophagus into the hypopharynx was observed (series 10, images 26-30). And the patient was observed to frequently reflex of Lee swallow additional times. A small volume of barium aspiration was apparent early in the course of the exam, with barium staining of the ventral trachea (e.g. Series 4). In general no cough was elicited. No hiatal hernia. No gastroesophageal reflux occurred spontaneously or was elicited. IMPRESSION: 1. No esophageal obstruction or narrowing. A 12.5 mm barium tablet passed freely to the stomach without delay. 2. Decreased mid and distal esophageal motility with frequent to and fro motion of retained contrast in the cervical and proximal thoracic esophagus. While prone reflux of this contrast from the esophagus into the hypopharynx was observed. 3. Intermittent small volume of barium aspiration which in general did not elicit a cough. 4. No gastroesophageal reflux occurred spontaneously or was elicited. Electronically Signed   By: Genevie Ann M.D.   On: 08/06/2015 16:23    ENDOSCOPIC STUDIES: Per HPI  IMPRESSION:   *  Dysphagia pharyngeal and esophagieal, long standing in pt with myasthenia gravis diagnosed in 03/2015.  Has esophageal dysmotility also. All this leading to feeding problems.  *  Aspiration PNA.  Trach placed 05/2015, eventually removed.  Underlying oxygen dependent COPD.     PLAN:     *  Per Dr Carlean Purl. Note that last Lovenox dose at 1500 yesterday.  Pt's room phone # is [phone].  PEG set for 6/9 as this is soonest case can be done with MAC.  However if MD willing to perform with moderate sedation, could be done tomorrow.    Frances Medina  08/08/2015, 11:35 AM Pager: [phone]    [facility] Attending   I have taken an interval history, reviewed the chart and examined the patient. I agree with the Advanced Practitioner's note, impression  and recommendations.    PEG is reasonable but MAC appropriate given MG and potential respiratory problems so higher risk for procedure.  The risks and benefits as well as alternatives of endoscopic procedure(s) have been discussed and reviewed. All questions answered. The patient agrees to proceed.  Plan for PEG 6/9  Gatha Mayer, MD, [facility] [phone] (pager) [phone] after 5 PM, weekends and holidays  08/08/2015 6:47 PM

## 2015-08-08 NOTE — Progress Notes (Signed)
Family Medicine Teaching Service Daily Progress Note Intern Pager: [phone]  Patient name: Frances Medina Medical record number: [id number] Date of birth: August 09, 1960 Age: 55 y.o. Gender: female  Primary Care Provider: Andrena Mews, MD Consultants: None Code Status: Full  Pt Overview and Major Events to Date:  6/4: Admitted to FMTS with shortness of breath, likely 2/2 to chronic aspiration.  Assessment and Plan: ALMARIE Medina is a 55 y.o. female presenting with cough, shortness of breath, and difficulty swallowing. PMH is significant for myasthenia gravis, HFpEF, splenomegaly, HTN, depression/anxiety.  GI: Severe chronic dysphagia: FEES testing performed by SLP on 6/5 showed severe backflow from the cervical esophagus, leading to gross aspiration. Esophagram (6/5) showed no esophageal obstruction or narrowing, decreased mid and distal esophageal motility with frequent to and fro motion of retained contrast in the cervical and proximal thoracic esophagus, and reflux of the contrast from the esophagus into the hypopharynx. - Spoke with IR this morning and they cannot do the PEG tube today or likely not tomorrow either. - Spoke with GI. They will talk about it and let us know. - Will follow-up additional SLP recommendations.  Resp: Chronic aspiration pneumonia/pneumonitis: Stable on room air overnight. On exam, mild expiratory wheezing auscultated. Lung exam improved from yesterday. - Azithromycin daily x 5 days (Today is Day 4). - Continue Prednisone 40mg  daily x 5 days. (Today is Day 4). - Blood cultures drawn in ED showing NG x 2 days. - Albuterol q4hrs prn. - Would still recommend PFTs as outpatient, especially given her 20 pack year smoking history. - PT/OT consult- no PT or OT f/u - Cardiac monitoring, continuous pulse ox  Neuro: Myasthenia Gravis: Stable. Follows with Dr. Porfirio Oar (Neurology) at [facility]. Also, just started speech therapy for assistance with swallowing this  past week. Pt states she is not having any weakness at this time. - Continue home med: Pyridostigmine 60mg  qid and 180mg  at bedtime - Speech consult, as above.  CV: HFpEF: Stable. ECHO 04/09/15 performed due to SOB showed EF of 60-65% and G1DD. Patient currently appears euvolemic. BNP 20 this admission. - Continue to monitor  Heme: Splenomegaly w/Lymphadenopathy: Being followed by Oncologist Dr. Irene Limbo. Work-up thus far has shown HIV neg, mono screen neg, elevated LDH at 395 and low haptoglobin at <10. Lymphoma ruled out with PET scan performed on 3/13. - Continue to monitor CBCs   Hx of Normocytic Anemia: Also followed by Dr. Irene Limbo (heme-onc). Last anemia panel 04/2015: Iron 76 (nl), TIBC 286 (nl), ferritin 688 (high). Has had elevated LDH, low haptoglobin, increased reticulocyte count, and spherocytes on peripheral blood smear in the past. 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 Hgb 13.1 in the ED > 11.8 this morning, likely dilutional with drops in all cell lines. MCV 78. - Continue home ferrous sulfate 325mg  bid - Miralax prn - Daily CBCs  History of HTN: BPs have ranged from as low as 115/80 to as high as 159/96 in the last 24 hours. - Not on any home anti-hypertensives - Continue to monitor. Will add prn if needed.  - Recommend following this as an outpatient  Seasonal allergies: Pt endorses rhinorrhea, itchy eyes, sore throat - Continue Claritin and ocean nasal spray  Psych: Depression and Anxiety: Tearful this morning. - Zoloft 50mg  daily, Klonopin 0.5-1mg  bid prn  GERD: - Continue home Pepcid  FEN/GI: Dysphagia I diet per speech recommendations. Prophylaxis: Lovenox  Disposition: Pending PEG placement. If cannot be done today, may need to set this up  as an outpatient.  Subjective:  Pt states she still has not been able to sleep well. She would very much like to go home today.  Objective: Temp:  [98 F (36.7 C)-98.2 F (36.8 C)] 98.2 F (36.8 C) (06/07 0459) Pulse  Rate:  [76-99] 99 (06/07 0459) Resp:  [16-18] 18 (06/07 0459) BP: (131-159)/(71-96) 159/96 mmHg (06/07 0459) SpO2:  [90 %-97 %] 97 % (06/07 0459) Physical Exam: General: Well-appearing female, sitting up in chair, tearful HEENT: [place]/AT, EOMI,  MMM, hoarse voice. Neck: Supple, no lymphadenopathy Cardiovascular: RRR, no murmurs, 2+ DP pulses Respiratory: Normal work of breathing, mild diffuse expiratory wheeze improved from yesterday. Abdomen: +BS, soft, non-tender, non-distended MSK: No edema or cyanosis Skin: No rashes, no lesions Neuro: Awake, alert, oriented, CN 2-12 grossly intact, no focal deficits. Psych: Appropriate affect, normal behavior.  Laboratory:  Recent Labs Lab 08/05/15 1022 08/06/15 0643 08/07/15 0904  WBC 9.4 6.6 5.9  HGB 13.1 11.8* 12.4  HCT 42.8 40.4 41.7  PLT 273 237 258    Recent Labs Lab 08/05/15 1022 08/06/15 0643 08/07/15 0435  NA 138 141 142  K 3.7 3.1* 3.7  CL 102 104 106  CO2 28 29 30   BUN 9 <5* <5*  CREATININE 0.68 0.63 0.51  CALCIUM 9.0 8.8* 9.1  PROT 6.6  --   --   BILITOT 0.4  --   --   ALKPHOS 76  --   --   ALT 23  --   --   AST 22  --   --   GLUCOSE 132* 105* 97   BNP 20  Imaging/Diagnostic Tests: -CXR (6/4): Bibasilar atelectasis, low lung volumes -Esophagram (6/5): No esophageal obstruction or narrowing. A 12.5 mm barium tablet passed freely to the stomach without delay, Decreased mid and distal esophageal motility with frequent to and fro motion of retained contrast in the cervical and proximal thoracic esophagus. While prone reflux of this contrast from the esophagus into the hypopharynx was observed, Intermittent small volume of barium aspiration which in general did not elicit a cough, No gastroesophageal reflux occurred spontaneously or was elicited.  Sela Hua, MD 08/08/2015, 7:20 AM PGY-1, [place] Intern pager: [phone], text pages welcome

## 2015-08-08 NOTE — Progress Notes (Signed)
PHARMACY - PHYSICIAN COMMUNICATION CRITICAL VALUE ALERT - BLOOD CULTURE IDENTIFICATION (BCID)  Results for orders placed or performed during the hospital encounter of 08/05/15  Blood Culture ID Panel (Reflexed) (Collected: 08/05/2015 12:38 PM)  Result Value Ref Range   Enterococcus species NOT DETECTED NOT DETECTED   Vancomycin resistance NOT DETECTED NOT DETECTED   Listeria monocytogenes NOT DETECTED NOT DETECTED   Staphylococcus species NOT DETECTED NOT DETECTED   Staphylococcus aureus NOT DETECTED NOT DETECTED   Methicillin resistance NOT DETECTED NOT DETECTED   Streptococcus species NOT DETECTED NOT DETECTED   Streptococcus agalactiae NOT DETECTED NOT DETECTED   Streptococcus pneumoniae NOT DETECTED NOT DETECTED   Streptococcus pyogenes NOT DETECTED NOT DETECTED   Acinetobacter baumannii NOT DETECTED NOT DETECTED   Enterobacteriaceae species NOT DETECTED NOT DETECTED   Enterobacter cloacae complex NOT DETECTED NOT DETECTED   Escherichia coli NOT DETECTED NOT DETECTED   Klebsiella oxytoca NOT DETECTED NOT DETECTED   Klebsiella pneumoniae NOT DETECTED NOT DETECTED   Proteus species NOT DETECTED NOT DETECTED   Serratia marcescens NOT DETECTED NOT DETECTED   Carbapenem resistance NOT DETECTED NOT DETECTED   Haemophilus influenzae NOT DETECTED NOT DETECTED   Neisseria meningitidis NOT DETECTED NOT DETECTED   Pseudomonas aeruginosa NOT DETECTED NOT DETECTED   Candida albicans NOT DETECTED NOT DETECTED   Candida glabrata NOT DETECTED NOT DETECTED   Candida krusei NOT DETECTED NOT DETECTED   Candida parapsilosis NOT DETECTED NOT DETECTED   Candida tropicalis NOT DETECTED NOT DETECTED    Name of physician (or Provider) Contacted: Dr. Lincoln Brigham ([facility])  Changes to prescribed antibiotics required: None. Continue Azithromycin   Kyilee Gregg S. Alford Highland, PharmD, [place] Clinical Staff Pharmacist Pager [phone]  Eilene Ghazi [facility] 08/08/2015  7:07 PM

## 2015-08-08 NOTE — Progress Notes (Signed)
Speech Language Pathology Treatment: Dysphagia  Patient Details Name: Frances Medina MRN: [id number] DOB: [birth year] Today's Date: 08/08/2015 Time: [id number] SLP Time Calculation (min) (ACUTE ONLY): 11 min  Assessment / Plan / Recommendation Clinical Impression  Pt is NPO pending PEG placement. SLP answered pt questions and reinforced need for continued therapy and f/u. Pt suggested to take majority of nutrition via PEG with minimal oral intake for pleasure, therapy and to combat disuse atrophy. Outlined water protocol and best methods for intake. Given increased signs of regurgitation following purees and thickened liquids and minimal aspiration visible with thin liquids in isolation, recommend consuming small amounts of thin liquids, preferably water, (teaspoon or very small cup sips) within 30 minutes of oral care. Pt recommended to continue f/u with OP SLP to address base of tongue strength, laryngeal elevation and hyoid excursion in setting of ongoing neuromuscular weakness. Also recommend f/u with ENT to address persistent glottal incompetence and appearance of small "chink" in posterior vocal folds with dysphonia and dysphagia.    HPI HPI: Frances Medina is a 55 y.o. female presenting with cough, shortness of breath, and difficulty swallowing. PMH is significant for myasthenia gravis, HFpEF, splenomegaly, HTN, depression/anxiety.  Pt was seen in April 2017 for an outpatient MBS after a noticable decline in swallowing at home. Pt was recommended to consume nectar and pureed foods with teaspoon bites/sips, a chin tuck and multiple swallow with concern for ability to tolerate given easy fatigue. Pt was seen last week at OP SLP who reinforced need to thicken ALL liquids to nectar consistency. Pt had a FEES in April showing partially incomplete glottic closure following prolonged intubation.       SLP Plan  Continue with current plan of care     Recommendations  Diet recommendations: Dysphagia 1  (puree) (water protocol) Liquids provided via: Teaspoon;Cup Medication Administration: Whole meds with puree Supervision: Patient able to self feed;Intermittent supervision to cue for compensatory strategies Compensations: Slow rate;Small sips/bites;Multiple dry swallows after each bite/sip;Clear throat intermittently             Oral Care Recommendations: Oral care BID Follow up Recommendations: Outpatient SLP Plan: Continue with current plan of care     Friona Karen Huhta, MA CCC-SLP [id number]  Lynann Beaver 08/08/2015, 10:02 AM

## 2015-08-09 MED ORDER — FERROUS SULFATE 325 (65 FE) MG PO TABS
325.0000 mg | ORAL_TABLET | Freq: Two times a day (BID) | ORAL | Status: DC
  Administered 2015-08-11: 325 mg via ORAL
  Filled 2015-08-09: qty 1

## 2015-08-09 MED ORDER — VANCOMYCIN HCL IN DEXTROSE 1-5 GM/200ML-% IV SOLN
1000.0000 mg | INTRAVENOUS | Status: AC
  Filled 2015-08-09 (×2): qty 200

## 2015-08-09 NOTE — Progress Notes (Signed)
          Daily Rounding Note  08/09/2015, 11:44 AM  LOS: 1 day   SUBJECTIVE:       Feels well.  No complaints other than leg cramps  OBJECTIVE:         Vital signs in last 24 hours:    Temp:  [97.9 F (36.6 C)-99.2 F (37.3 C)] 97.9 F (36.6 C) (06/08 0412) Pulse Rate:  [99-117] 99 (06/08 0412) Resp:  [18-19] 18 (06/08 0412) BP: (124-130)/(53-77) 128/57 mmHg (06/08 0412) SpO2:  [92 %-98 %] 95 % (06/08 0932) Last BM Date: 08/08/15 Filed Weights   08/05/15 0954  Weight: 108.183 kg (238 lb 8 oz)   General: looks well.  comfortable   Heart: RRR Chest: clear bil.  No labored breathing.   Abdomen: obese, active BS, NT  Extremities: no pitting edema.   Neuro/Psych:  Oriented x 3.  Fully alert.  Calm.    Lab Results:  Recent Labs  08/07/15 0904  WBC 5.9  HGB 12.4  HCT 41.7  PLT 258   BMET  Recent Labs  08/07/15 0435  NA 142  K 3.7  CL 106  CO2 30  GLUCOSE 97  BUN <5*  CREATININE 0.51  CALCIUM 9.1     ASSESMENT:   * Dysphagia pharyngeal and esophagieal, long standing in pt with myasthenia gravis diagnosed in 03/2015. Has esophageal dysmotility also. All this leading to feeding problems.  * Aspiration PNA. Trach placed 05/2015, eventually removed. Underlying oxygen dependent COPD.  Completed 5 days azithromycin.   *  MG: on prednisone, Pyritostigmine.   *  Microcytosis with minor anemia.  On po Iron.     PLAN   *  PEG at 10:30 tomorrow.  As she has pcn and cipro allergy will prophylax with vancomycin. Hold Lovenox for procedure. Hold iron and resume on 6/10.      Olar Wirsing  08/09/2015, 11:44 AM Pager: [phone]  Agree w/ Frances Medina management. Gatha Mayer, MD, [facility] [phone] (pager) [phone] after 5 PM, weekends and holidays  08/09/2015 5:04 PM

## 2015-08-09 NOTE — Progress Notes (Signed)
Physical Therapy Treatment Patient Details Name: Frances Medina MRN: [id number] DOB: 1960/09/25 Today's Date: 08/09/2015    History of Present Illness Pt is a 55 y/o F presenting w/ coug, SOB, and difficulty swallowing.  Chest x-ray showed bibasilar atelectasis.  Pt's PMH includes myasthenia gravis, splenomegaly, depression, anxiety, intubated during last admission, diplopia, anemia, obesity, Lt and Rt partial knee arthroplasty.    PT Comments    Pt performed transfers with MOD I level of assist. Pt with c/o tightness in L foot.  Educated patient on stretching with strap to improve ROM and decrease pain and tightness.  Pt reports decreased pain but refused to ambulate after just ambulating prior to PTA arrival.     Follow Up Recommendations  No PT follow up;Supervision - Intermittent     Equipment Recommendations  None recommended by PT    Recommendations for Other Services       Precautions / Restrictions Precautions Precautions: Fall Precaution Comments: monitor O2/HR Restrictions Weight Bearing Restrictions: No    Mobility  Bed Mobility                  Transfers Overall transfer level: Independent Equipment used: None Transfers: Sit to/from Stand Sit to Stand: Modified independent (Device/Increase time)         General transfer comment: Good technique from chair and performed toilet transfer independently.    Ambulation/Gait Ambulation/Gait assistance: Supervision Ambulation Distance (Feet):  (15x2 to and from bathroom in room.   Pt reports ambulating this am and refused ambulation in halls.  Agreeable to walk to bathroom and back.  ) Assistive device: None           Stairs            Wheelchair Mobility    Modified Rankin (Stroke Patients Only)       Balance     Sitting balance-Leahy Scale: Good       Standing balance-Leahy Scale: Good                      Cognition Arousal/Alertness: Awake/alert Behavior During  Therapy: WFL for tasks assessed/performed Overall Cognitive Status: Within Functional Limits for tasks assessed                      Exercises Other Exercises Other Exercises: Pt performed 3x30 sec Heel cord stretching to B LEs, 1x30 sec to B quads and hamstrings.      General Comments        Pertinent Vitals/Pain Pain Assessment: Faces Faces Pain Scale: Hurts a little bit Pain Location: Reports cramping in instep of L foot.   Pain Intervention(s): Repositioned (instructed in heel cord stretch and pain alleviated. )    Home Living                      Prior Function            PT Goals (current goals can now be found in the care plan section) Acute Rehab PT Goals Patient Stated Goal: go home today Potential to Achieve Goals: Good Progress towards PT goals: Progressing toward goals    Frequency  Min 3X/week    PT Plan Current plan remains appropriate    Co-evaluation             End of Session Equipment Utilized During Treatment: Gait belt Activity Tolerance: Patient tolerated treatment well Patient left: in chair;with call bell/phone within reach  Time: [id number] PT Time Calculation (min) (ACUTE ONLY): 23 min  Charges:  $Therapeutic Exercise: 8-22 mins $Therapeutic Activity: 8-22 mins                    G Codes:      Cristela Blue 2015/08/10, 12:03 PM  Governor Rooks, PTA pager [phone]

## 2015-08-09 NOTE — Progress Notes (Signed)
Nutrition Follow-up  DOCUMENTATION CODES:   Severe malnutrition in context of chronic illness, Morbid obesity  INTERVENTION:  Continue Boost Plus po TID, each supplement provides 360 kcal and 14 grams of protein.   Once PEG is placed and stable/ready for use, consult dietitian, Recommend bolus tube feeds of Jevity 1.5 formula. Start at 150 ml and increase by 100 ml every 6 hours to goal volume of 275 ml, 4 times daily (QID) with 30 ml Prostat BID to provide 1850 kcal (100% of kcal needs), 100 grams of protein (95% of protein needs), and 836 ml of free water.   Recommend continuation of diet PO for comfort.   RD to continue to monitor.   NUTRITION DIAGNOSIS:   Inadequate oral intake related to dysphagia as evidenced by meal completion < 50%; ongoing  GOAL:   Patient will meet greater than or equal to 90% of their needs; not met  MONITOR:   PO intake, Weight trends, Labs, I & O's  REASON FOR ASSESSMENT:   Malnutrition Screening Tool    ASSESSMENT:   55 y.o. female presenting with cough, shortness of breath, and difficulty swallowing. PMH is significant for myasthenia gravis, HFpEF, splenomegaly, HTN, depression/anxiety.  Plans for PEG placement tomorrow. Pt is currently on a clear liquid diet. Meal completion has been 25%. Pt reports she has only been able to consume the juice on trays and drink her boost. Pt reports she was unable to consume the pureed food on her dysphagia 1 tray as it would make her cough. Pt reports once tube feeding may be started after PEG placement that she would like bolus tube feeds. Bolus tube feeding recommendations have been started above. Recommend continuation of diet po for comfort however sole nutrition will be via TF. RD to continue to monitor.   Diet Order:  Diet clear liquid Room service appropriate?: Yes; Fluid consistency:: Thin Diet NPO time specified Except for: Sips with Meds  Skin:  Reviewed, no issues  Last BM:  6/7  Height:    Ht Readings from Last 1 Encounters:  08/05/15 5' (1.524 m)    Weight:   Wt Readings from Last 1 Encounters:  08/05/15 238 lb 8 oz (108.183 kg)    Ideal Body Weight:  45.45 kg  BMI:  Body mass index is 46.58 kg/(m^2).  Estimated Nutritional Needs:   Kcal:  4314-2767  Protein:  105-115 grams  Fluid:  1.8 - 2 L/day  EDUCATION NEEDS:   No education needs identified at this time  Corrin Parker, MS, RD, LDN Pager # [phone] After hours/ weekend pager # [phone]

## 2015-08-09 NOTE — Progress Notes (Signed)
Family Medicine Teaching Service Daily Progress Note Intern Pager: [phone]  Patient name: Frances Medina Medical record number: [id number] Date of birth: 02/11/1961 Age: 55 y.o. Gender: female  Primary Care Provider: Andrena Mews, MD Consultants: None Code Status: Full  Pt Overview and Major Events to Date:  6/4: Admitted to [facility]MT[facility] with shortness of breath, likely 2/2 to chronic aspiration. 6/5: FEES testing showed severe backflow from the cervical esophagus, leading to gross aspiration. 6/5: Esophagram showed no esophageal obstruction or narrowing, decreased mid and distal esophageal motility with frequent to and fro motion of retained contrast in the cervical and proximal thoracic esophagus, and reflux of the contrast from the esophagus into the hypopharynx.  Assessment and Plan: Frances Medina is a 55 y.o. female presenting with cough, shortness of breath, and difficulty swallowing. PMH is significant for myasthenia gravis, HFpEF, splenomegaly, HTN, depression/anxiety.  GI: Severe chronic dysphagia:  - Plan for PEG with GI on 6/9. Will make her NPO at midnight. - Will follow-up additional SLP recommendations.  Resp: Chronic aspiration pneumonia/pneumonitis: Stable on room air overnight. On exam, mild-moderate expiratory wheezing auscultated. - Azithromycin daily x 5 days (Today is Day 5). - Continue Prednisone 40mg  daily x 5 days. (Today is Day 4). - Blood cultures drawn in ED on 6/4 grew gram positive rods in 1 bottle. No bacteria was detected on the initial ID panel. Likely contaminant.  - Albuterol q4hrs prn. - Would still recommend PFTs as outpatient, especially given her 20 pack year smoking history. - PT/OT consult- no PT or OT f/u - Cardiac monitoring, continuous pulse ox  Neuro: Myasthenia Gravis: Stable. Follows with Dr. Porfirio Oar (Neurology) at [facility]. Also, just started speech therapy for assistance with swallowing this past week. Pt states she is not having any  weakness at this time. - Continue home med: Pyridostigmine 60mg  qid and 180mg  at bedtime - Speech consult, as above.  CV: HFpEF: Stable. ECHO 04/09/15 performed due to SOB showed EF of 60-65% and G1DD. Patient currently appears euvolemic. BNP 20 this admission. - Continue to monitor  Heme: Splenomegaly w/Lymphadenopathy: Being followed by Oncologist Dr. Irene Limbo. Work-up thus far has shown HIV neg, mono screen neg, elevated LDH at 395 and low haptoglobin at <10. Lymphoma ruled out with PET scan performed on 3/13. - Continue to monitor CBCs   Hx of Normocytic Anemia: Also followed by Dr. Irene Limbo (heme-onc). Last anemia panel 04/2015: Iron 76 (nl), TIBC 286 (nl), ferritin 688 (high). Has had elevated LDH, low haptoglobin, increased reticulocyte count, and spherocytes on peripheral blood smear in the past. 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 Hgb stable at 12.4. - Continue home ferrous sulfate 325mg  bid - Miralax prn  History of HTN: BPs 128/57 this morning. - Not on any home anti-hypertensives - Continue to monitor. Will add prn if needed.  - Recommend following this as an outpatient  Seasonal allergies: Pt endorses rhinorrhea, itchy eyes, sore throat - Continue Claritin and ocean nasal spray  Psych: Depression and Anxiety: Tearful this morning. - Zoloft 50mg  daily, Klonopin 0.5-1mg  bid prn  GERD: - Continue home Pepcid  FEN/GI: Dysphagia I diet per speech recommendations. Prophylaxis: Lovenox  Disposition: Pending PEG placement. If cannot be done today, may need to set this up as an outpatient.  Subjective:  Pt states she is doing much better this morning. She slept very well last night. She is ready to have the NG tube tomorrow and then go home. No other concerns.  Objective: Temp:  [97.9  F (36.6 C)-99.2 F (37.3 C)] 97.9 F (36.6 C) (06/08 0412) Pulse Rate:  [99-117] 99 (06/08 0412) Resp:  [18-19] 18 (06/08 0412) BP: (124-130)/(53-77) 128/57 mmHg (06/08 0412) SpO2:  [92  %-98 %] 95 % (06/08 0412) Physical Exam: General: Well-appearing female, sitting up in chair, in NAD, plesant HEENT: [place]/AT, EOMI,  MMM, hoarse voice. Neck: Supple, no lymphadenopathy Cardiovascular: RRR, no murmurs, 2+ DP pulses Respiratory: Normal work of breathing, moderate diffuse expiratory wheezes Abdomen: +BS, soft, non-tender, non-distended MSK: No edema or cyanosis Skin: No rashes, no lesions Neuro: Awake, alert, oriented, CN 2-12 grossly intact, no focal deficits. Psych: Appropriate affect, normal behavior.  Laboratory:  Recent Labs Lab 08/05/15 1022 08/06/15 0643 08/07/15 0904  WBC 9.4 6.6 5.9  HGB 13.1 11.8* 12.4  HCT 42.8 40.4 41.7  PLT 273 237 258    Recent Labs Lab 08/05/15 1022 08/06/15 0643 08/07/15 0435  NA 138 141 142  K 3.7 3.1* 3.7  CL 102 104 106  CO2 28 29 30   BUN 9 <5* <5*  CREATININE 0.68 0.63 0.51  CALCIUM 9.0 8.8* 9.1  PROT 6.6  --   --   BILITOT 0.4  --   --   ALKPHOS 76  --   --   ALT 23  --   --   AST 22  --   --   GLUCOSE 132* 105* 97   BNP 20  Imaging/Diagnostic Tests: -CXR (6/4): Bibasilar atelectasis, low lung volumes -Esophagram (6/5): No esophageal obstruction or narrowing. A 12.5 mm barium tablet passed freely to the stomach without delay, Decreased mid and distal esophageal motility with frequent to and fro motion of retained contrast in the cervical and proximal thoracic esophagus. While prone reflux of this contrast from the esophagus into the hypopharynx was observed, Intermittent small volume of barium aspiration which in general did not elicit a cough, No gastroesophageal reflux occurred spontaneously or was elicited.  Sela Hua, MD 08/09/2015, 7:02 AM PGY-1, [place] Intern pager: [phone], text pages welcome

## 2015-08-10 ENCOUNTER — Inpatient Hospital Stay (HOSPITAL_COMMUNITY): Payer: BC Managed Care – PPO | Admitting: Anesthesiology

## 2015-08-10 ENCOUNTER — Encounter (HOSPITAL_COMMUNITY)
Admission: EM | Disposition: A | Discharge status: Home Health | Payer: Self-pay | Source: Home / Self Care | Attending: Family Medicine

## 2015-08-10 ENCOUNTER — Encounter (HOSPITAL_COMMUNITY): Payer: Self-pay | Admitting: *Deleted

## 2015-08-10 DIAGNOSIS — R633 Feeding difficulties: Secondary | ICD-10-CM

## 2015-08-10 DIAGNOSIS — T17998A Other foreign object in respiratory tract, part unspecified causing other injury, initial encounter: Secondary | ICD-10-CM

## 2015-08-10 DIAGNOSIS — T17908A Unspecified foreign body in respiratory tract, part unspecified causing other injury, initial encounter: Secondary | ICD-10-CM | POA: Insufficient documentation

## 2015-08-10 DIAGNOSIS — R6339 Other feeding difficulties: Secondary | ICD-10-CM | POA: Insufficient documentation

## 2015-08-10 HISTORY — PX: PEG PLACEMENT: SHX5437

## 2015-08-10 HISTORY — PX: ESOPHAGOGASTRODUODENOSCOPY: SHX5428

## 2015-08-10 LAB — CULTURE, BLOOD (ROUTINE X 2): Culture: NO GROWTH

## 2015-08-10 LAB — GLUCOSE, CAPILLARY: Glucose-Capillary: 157 mg/dL — ABNORMAL HIGH (ref 65–99)

## 2015-08-10 SURGERY — EGD (ESOPHAGOGASTRODUODENOSCOPY)
Anesthesia: Monitor Anesthesia Care

## 2015-08-10 MED ORDER — JEVITY 1.5 CAL/FIBER PO LIQD
275.0000 mL | Freq: Four times a day (QID) | ORAL | Status: DC
  Administered 2015-08-10: 100 mL
  Administered 2015-08-11: 140 mL
  Administered 2015-08-11: 200 mL
  Filled 2015-08-10 (×8): qty 1000

## 2015-08-10 MED ORDER — PRO-STAT SUGAR FREE PO LIQD
30.0000 mL | Freq: Two times a day (BID) | ORAL | Status: DC
  Administered 2015-08-11: 30 mL
  Filled 2015-08-10 (×2): qty 30

## 2015-08-10 MED ORDER — PROPOFOL 500 MG/50ML IV EMUL
INTRAVENOUS | Status: DC | PRN
  Administered 2015-08-10: 75 ug/kg/min via INTRAVENOUS

## 2015-08-10 MED ORDER — ALBUTEROL SULFATE (2.5 MG/3ML) 0.083% IN NEBU
2.5000 mg | INHALATION_SOLUTION | Freq: Once | RESPIRATORY_TRACT | Status: AC
  Administered 2015-08-10: 2.5 mg via RESPIRATORY_TRACT

## 2015-08-10 MED ORDER — VANCOMYCIN HCL 1000 MG IV SOLR
1000.0000 mg | INTRAVENOUS | Status: DC | PRN
  Administered 2015-08-10: 1000 mg via INTRAVENOUS

## 2015-08-10 MED ORDER — MIDAZOLAM HCL 5 MG/5ML IJ SOLN
INTRAMUSCULAR | Status: DC | PRN
  Administered 2015-08-10 (×2): 1 mg via INTRAVENOUS

## 2015-08-10 MED ORDER — ENOXAPARIN SODIUM 60 MG/0.6ML ~~LOC~~ SOLN
50.0000 mg | SUBCUTANEOUS | Status: DC
  Administered 2015-08-10: 50 mg via SUBCUTANEOUS
  Filled 2015-08-10: qty 0.6

## 2015-08-10 MED ORDER — PROPOFOL 10 MG/ML IV BOLUS: INTRAVENOUS | Status: DC | PRN

## 2015-08-10 MED ORDER — JEVITY 1.2 CAL PO LIQD: 1000.0000 mL | ORAL | Status: DC

## 2015-08-10 MED ORDER — LACTATED RINGERS IV SOLN
INTRAVENOUS | Status: DC | PRN
  Administered 2015-08-10: 10:00:00 via INTRAVENOUS

## 2015-08-10 MED ORDER — ALBUTEROL SULFATE (2.5 MG/3ML) 0.083% IN NEBU
INHALATION_SOLUTION | RESPIRATORY_TRACT | Status: AC
  Filled 2015-08-10: qty 3

## 2015-08-10 MED ORDER — SODIUM CHLORIDE 0.9 % IV SOLN: INTRAVENOUS | Status: DC

## 2015-08-10 NOTE — Progress Notes (Signed)
Nutrition Follow-up  DOCUMENTATION CODES:   Severe malnutrition in context of chronic illness, Morbid obesity  INTERVENTION:  Initiate bolus tube feeds of Jevity 1.5 formula. Start with slow administration of 100 ml and increase by 100 ml every 6 hours to goal volume of 275 ml, 4 times daily (QID) with 30 ml Prostat BID to provide 1850 kcal (100% of kcal needs), 100 grams of protein (95% of protein needs), and 836 ml of free water.   Recommend continuation of diet PO for comfort.   Once IVF are discontinued and free water flushes are warranted, recommend free water flushes of 200 ml TID per tube.   NUTRITION DIAGNOSIS:   Inadequate oral intake related to dysphagia as evidenced by meal completion < 50%; ongoing  GOAL:   Patient will meet greater than or equal to 90% of their needs; progressing  MONITOR:   PO intake, Supplement acceptance, TF tolerance, Weight trends, Labs, I & O's  REASON FOR ASSESSMENT:   Malnutrition Screening Tool    ASSESSMENT:   55 y.o. female presenting with cough, shortness of breath, and difficulty swallowing. PMH is significant for myasthenia gravis, HFpEF, splenomegaly, HTN, depression/anxiety.  PEG placed today. RD consulted for enteral/tube feeding initiation and management. RD "ok'ed" to start tube feeds. RD to order feeds. Plans for bolus tube feeds with slow administration to ensure tolerance. Plans for discharge tomorrow.   Diet Order:  Diet clear liquid Room service appropriate?: Yes; Fluid consistency:: Thin  Skin:  Reviewed, no issues  Last BM:  6/8  Height:   Ht Readings from Last 1 Encounters:  08/10/15 5' (1.524 m)    Weight:   Wt Readings from Last 1 Encounters:  08/10/15 238 lb (107.956 kg)    Ideal Body Weight:  45.45 kg  BMI:  Body mass index is 46.48 kg/(m^2).  Estimated Nutritional Needs:   Kcal:  [id number]  Protein:  105-115 grams  Fluid:  1.8 - 2 L/day  EDUCATION NEEDS:   No education needs identified at  this time  Corrin Parker, MS, RD, LDN Pager # [phone] After hours/ weekend pager # [phone]

## 2015-08-10 NOTE — Progress Notes (Signed)
I checked in on Frances Medina after her procedure this morning. She states she is doing really well. She is not having any pain. She states that her abdomen is "full" but not painful. She has no concerns.  Plan for discharge home tomorrow.  Hyman Bible, MD PGY-1

## 2015-08-10 NOTE — Anesthesia Preprocedure Evaluation (Signed)
Anesthesia Evaluation  Patient identified by MRN, date of birth, ID band Patient awake    Reviewed: Allergy & Precautions, NPO status , Patient's Chart, lab work & pertinent test results  Airway Mallampati: II  TM Distance: >3 FB Neck ROM: Full    Dental  (+) Teeth Intact, Dental Advisory Given   Pulmonary pneumonia, former smoker,  Chronic aspiration pna   breath sounds clear to auscultation       Cardiovascular hypertension,  Rhythm:Regular Rate:Normal     Neuro/Psych Myasthenia gravis  Neuromuscular disease    GI/Hepatic Neg liver ROS, GERD  ,  Endo/Other  negative endocrine ROS  Renal/GU negative Renal ROS     Musculoskeletal  (+) Arthritis ,   Abdominal (+) + obese,   Peds  Hematology negative hematology ROS (+)   Anesthesia Other Findings   Reproductive/Obstetrics                             Anesthesia Physical  Anesthesia Plan  ASA: III  Anesthesia Plan: MAC   Post-op Pain Management:    Induction: Intravenous  Airway Management Planned: Natural Airway and Simple Face Mask  Additional Equipment:   Intra-op Plan:   Post-operative Plan:   Informed Consent: I have reviewed the patients History and Physical, chart, labs and discussed the procedure including the risks, benefits and alternatives for the proposed anesthesia with the patient or authorized representative who has indicated his/her understanding and acceptance.   Dental advisory given  Plan Discussed with: CRNA and Anesthesiologist  Anesthesia Plan Comments:         Anesthesia Quick Evaluation

## 2015-08-10 NOTE — Anesthesia Postprocedure Evaluation (Signed)
Anesthesia Post Note  Patient: Frances Medina  Procedure(s) Performed: Procedure(s) (LRB): ESOPHAGOGASTRODUODENOSCOPY (EGD) (N/A) PERCUTANEOUS ENDOSCOPIC GASTROSTOMY (PEG) PLACEMENT (N/A)  Patient location during evaluation: PACU Anesthesia Type: MAC Level of consciousness: awake and alert Pain management: pain level controlled Vital Signs Assessment: post-procedure vital signs reviewed and stable Respiratory status: spontaneous breathing, nonlabored ventilation, respiratory function stable and patient connected to nasal cannula oxygen Cardiovascular status: stable and blood pressure returned to baseline Anesthetic complications: no    Last Vitals:  Filed Vitals:   08/10/15 1135 08/10/15 1229  BP: 147/89 147/81  Pulse: 102 84  Temp:  36.4 C  Resp: 20 20    Last Pain:  Filed Vitals:   08/10/15 1323  PainSc: 4                  Tiajuana Amass

## 2015-08-10 NOTE — Transfer of Care (Signed)
Immediate Anesthesia Transfer of Care Note  Patient: Frances Medina  Procedure(s) Performed: Procedure(s): ESOPHAGOGASTRODUODENOSCOPY (EGD) (N/A) PERCUTANEOUS ENDOSCOPIC GASTROSTOMY (PEG) PLACEMENT (N/A)  Patient Location: PACU and Endoscopy Unit  Anesthesia Type:MAC  Level of Consciousness: awake, alert , oriented and sedated  Airway & Oxygen Therapy: Patient Spontanous Breathing and Patient connected to nasal cannula oxygen  Post-op Assessment: Report given to RN, Post -op Vital signs reviewed and stable and Patient moving all extremities  Post vital signs: Reviewed and stable  Last Vitals:  Filed Vitals:   08/10/15 0939 08/10/15 1105  BP: 176/83 178/95  Pulse: 77 102  Temp: 36.7 C 36.4 C  Resp: 19 24    Last Pain:  Filed Vitals:   08/10/15 1107  PainSc: 0-No pain         Complications: No apparent anesthesia complications

## 2015-08-10 NOTE — Progress Notes (Signed)
Family Medicine Teaching Service Daily Progress Note Intern Pager: [phone]  Patient name: Frances Medina Medical record number: [id number] Date of birth: 07/29/1960 Age: 55 y.o. Gender: female  Primary Care Provider: Andrena Mews, MD Consultants: None Code Status: Full  Pt Overview and Major Events to Date:  6/4: Admitted to FMTS with shortness of breath, likely 2/2 to chronic aspiration. 6/5: FEES testing showed severe backflow from the cervical esophagus, leading to gross aspiration. 6/5: Esophagram showed no esophageal obstruction or narrowing, decreased mid and distal esophageal motility with frequent to and fro motion of retained contrast in the cervical and proximal thoracic esophagus, and reflux of the contrast from the esophagus into the hypopharynx.  Assessment and Plan: Frances Medina is a 55 y.o. female presenting with cough, shortness of breath, and difficulty swallowing. PMH is significant for myasthenia gravis, HFpEF, splenomegaly, HTN, depression/anxiety.  GI: Severe chronic dysphagia:  - Plan for PEG this morning. Will follow-up GI recs. - Speech recommending minimal oral intake for pleasure, therapy, and to combat disuse atrophy. - Resume outpatient SLP on discharge.  Resp: Chronic aspiration pneumonia/pneumonitis: Stable on room air overnight. On exam, mild-moderate expiratory wheezing auscultated. - s/p 5 day course of Azithromycin - Continue Prednisone 40mg  daily x 5 days. (Today is Day 5). - Blood cultures drawn in ED on 6/4 grew gram positive rods in 1 bottle. No bacteria was detected on the initial ID panel. Likely contaminant. Awaiting speciation.  - Albuterol q4hrs prn. - Would still recommend PFTs as outpatient, especially given her 20 pack year smoking history. - PT/OT consult- no PT or OT f/u - Cardiac monitoring, continuous pulse ox  Neuro: Myasthenia Gravis: Stable. Follows with Dr. Porfirio Oar (Neurology) at [facility]. Also, just started speech  therapy for assistance with swallowing this past week. Pt states she is not having any weakness at this time. - Continue home med: Pyridostigmine 60mg  qid and 180mg  at bedtime - Speech consult, as above.  CV: HFpEF: Stable. ECHO 04/09/15 performed due to SOB showed EF of 60-65% and G1DD. Patient currently appears euvolemic. BNP 20 this admission. - Continue to monitor  Heme: Splenomegaly w/Lymphadenopathy: Being followed by Oncologist Dr. Irene Limbo. Work-up thus far has shown HIV neg, mono screen neg, elevated LDH at 395 and low haptoglobin at <10. Lymphoma ruled out with PET scan performed on 3/13. - Continue to monitor CBCs   Hx of Normocytic Anemia: Also followed by Dr. Irene Limbo (heme-onc). Last anemia panel 04/2015: Iron 76 (nl), TIBC 286 (nl), ferritin 688 (high). Has had elevated LDH, low haptoglobin, increased reticulocyte count, and spherocytes on peripheral blood smear in the past. 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 Hgb stable at 12.4. - Continue home ferrous sulfate 325mg  bid - Miralax prn  History of HTN: BP 129/92 this morning. - Not on any home anti-hypertensives - Continue to monitor. Will add prn if needed.  - Recommend following this as an outpatient  Seasonal allergies: Pt endorses rhinorrhea, itchy eyes, sore throat - Continue Claritin and ocean nasal spray  Psych: Depression and Anxiety: Tearful this morning. - Zoloft 50mg  daily, Klonopin 0.5-1mg  bid prn  GERD: - Continue home Pepcid  FEN/GI: Dysphagia I diet per speech recommendations. Prophylaxis: Lovenox  Disposition: Plan for discharge home today after PEG tube.  Subjective:  Pt states she is doing really well this morning. She is ready to get the PEG tube and go home. She has no concerns.  Objective: Temp:  [97.7 F (36.5 C)] 97.7 F (36.5 C) (  06/08 2000) Pulse Rate:  [94-107] 107 (06/08 2000) Resp:  [18] 18 (06/08 2000) BP: (138-148)/(85-88) 148/88 mmHg (06/08 2000) SpO2:  [94 %-97 %] 97 % (06/08  2028) Physical Exam: General: Well-appearing female, sitting up in chair, in NAD, pleasant HEENT: [place]/AT, EOMI,  MMM, hoarse voice. Neck: Supple, no lymphadenopathy Cardiovascular: RRR, no murmurs, 2+ DP pulses Respiratory: Normal work of breathing, moderate diffuse expiratory wheezes Abdomen: +BS, soft, non-tender, non-distended MSK: No edema or cyanosis Skin: No rashes, no lesions Neuro: Awake, alert, oriented, CN 2-12 grossly intact, no focal deficits. Psych: Appropriate affect, normal behavior.  Laboratory:  Recent Labs Lab 08/05/15 1022 08/06/15 0643 08/07/15 0904  WBC 9.4 6.6 5.9  HGB 13.1 11.8* 12.4  HCT 42.8 40.4 41.7  PLT 273 237 258    Recent Labs Lab 08/05/15 1022 08/06/15 0643 08/07/15 0435  NA 138 141 142  K 3.7 3.1* 3.7  CL 102 104 106  CO2 28 29 30   BUN 9 <5* <5*  CREATININE 0.68 0.63 0.51  CALCIUM 9.0 8.8* 9.1  PROT 6.6  --   --   BILITOT 0.4  --   --   ALKPHOS 76  --   --   ALT 23  --   --   AST 22  --   --   GLUCOSE 132* 105* 97   BNP 20  Imaging/Diagnostic Tests: -CXR (6/4): Bibasilar atelectasis, low lung volumes -Esophagram (6/5): No esophageal obstruction or narrowing. A 12.5 mm barium tablet passed freely to the stomach without delay, Decreased mid and distal esophageal motility with frequent to and fro motion of retained contrast in the cervical and proximal thoracic esophagus. While prone reflux of this contrast from the esophagus into the hypopharynx was observed, Intermittent small volume of barium aspiration which in general did not elicit a cough, No gastroesophageal reflux occurred spontaneously or was elicited.  Sela Hua, MD 08/10/2015, 7:00 AM PGY-1, [place] Intern pager: [phone], text pages welcome

## 2015-08-10 NOTE — Op Note (Signed)
[facility] Patient Name: Frances Medina Procedure Date : 08/10/2015 MRN: [id number] Attending MD: Gatha Mayer , MD Date of Birth: Mar 08, 1960 CSN: [id number] Age: 55 Admit Type: Inpatient Procedure:                Upper GI endoscopy Indications:              Therapeutic procedure, Dysphagia, Place PEG due to                            dysphagia, Place PEG due to aspiration risk Providers:                Gatha Mayer, MD, Cleda Daub, RN, Zenon Mayo, RN, Elspeth Cho, Technician, Gertie Exon,                            CRNA Referring MD:              Medicines:                Monitored Anesthesia Care, Vancomycin 123XX123 mg IV Complications:            No immediate complications. Estimated Blood Loss:     Estimated blood loss was minimal. Procedure:                Pre-Anesthesia Assessment:                           - Prior to the procedure, a History and Physical                            was performed, and patient medications and                            allergies were reviewed. The patient's tolerance of                            previous anesthesia was also reviewed. The risks                            and benefits of the procedure and the sedation                            options and risks were discussed with the patient.                            All questions were answered, and informed consent                            was obtained. Prior Anticoagulants: The patient                            last took Lovenox (enoxaparin) 1 day prior to the  procedure. ASA Grade Assessment: III - A patient                            with severe systemic disease. After reviewing the                            risks and benefits, the patient was deemed in                            satisfactory condition to undergo the procedure.                           After obtaining informed consent, the endoscope was                     passed under direct vision. Throughout the                            procedure, the patient's blood pressure, pulse, and                            oxygen saturations were monitored continuously. The                            [id number]) scope was introduced through the                            mouth, and advanced to the second part of duodenum.                            The upper GI endoscopy was accomplished without                            difficulty. The patient tolerated the procedure                            fairly well. Scope In: Scope Out: Findings:      The examined esophagus was normal.      The entire examined stomach was normal.      The examined duodenum was normal. The patient was placed in the supine       position for PEG placement. The stomach was insufflated to appose       gastric and abdominal walls. A site was located in the body of the       stomach with good manual external pressure for placement. The abdominal       wall was marked and prepped in a sterile manner. The area was       anesthetized with 4 mL of 1% lidocaine. The trocar needle was introduced       through the abdominal wall and into the stomach under direct endoscopic       view. A snare was introduced through the endoscope and opened in the       gastric lumen. The guide wire was passed through the trocar and into the       open snare. The snare was closed around the  guide wire. The endoscope       and snare were removed, pulling the wire out through the mouth. A skin       incision was made at the site of needle insertion. The externally       removable 24 Fr EndoVive Safety gastrostomy tube was lubricated. The       G-tube was passed over the guide wire through the mouth, and into the       stomach. The trocar needle was removed, and the gastrostomy tube was       pulled out from the stomach through the skin. The guide wire was       removed, and the external  bumper attached to the gastrostomy tube. The       feeding tube was then cut to an appropriate length. The final position       of the gastrostomy tube was confirmed by relook endoscopy, and skin       marking noted to be 4.5 cm at the external bumper. The final tension and       compression of the abdominal wall by the PEG tube and external bumper       were checked. The feeding tube was capped, and the tube site was cleaned       and dressed. Estimated blood loss was minimal.      The cardia and gastric fundus were normal on retroflexion. Impression:               - Normal esophagus.                           - Normal stomach.                           - Normal examined duodenum.                           - An externally removable PEG placement was                            successfully completed.                           - No specimens collected. Moderate Sedation:      Please see anesthesia notes, moderate sedation not given Recommendation:           - Return patient to hospital ward for ongoing care.                           - Resume previous diet.                           - Resume Lovenox (enoxaparin) at prior dose today.                           - Dietitian to see and start enteral nutrition                           if she is ok tomorrow home w/ home health support  temporarily Procedure Code(s):        --- Professional ---                           [phone], Esophagogastroduodenoscopy, flexible,                            transoral; with directed placement of percutaneous                            gastrostomy tube Diagnosis Code(s):        --- Professional ---                           R13.10, Dysphagia, unspecified                           Z43.1, Encounter for attention to gastrostomy                           R63.3, Feeding difficulties CPT copyright 2016 American Medical Association. All rights reserved. The codes documented in this report are  preliminary and upon coder review may  be revised to meet current compliance requirements. Gatha Mayer, MD 08/10/2015 11:04:40 AM This report has been signed electronically. Number of Addenda: 0

## 2015-08-11 ENCOUNTER — Encounter (HOSPITAL_COMMUNITY): Payer: Self-pay | Admitting: Internal Medicine

## 2015-08-11 LAB — GLUCOSE, CAPILLARY
Glucose-Capillary: 104 mg/dL — ABNORMAL HIGH (ref 65–99)
Glucose-Capillary: 110 mg/dL — ABNORMAL HIGH (ref 65–99)
Glucose-Capillary: 113 mg/dL — ABNORMAL HIGH (ref 65–99)
Glucose-Capillary: 117 mg/dL — ABNORMAL HIGH (ref 65–99)

## 2015-08-11 MED ORDER — IPRATROPIUM-ALBUTEROL 0.5-2.5 (3) MG/3ML IN SOLN: 3.0000 mL | RESPIRATORY_TRACT | Status: DC | PRN

## 2015-08-11 MED ORDER — BOOST PLUS PO LIQD: 237.0000 mL | Freq: Three times a day (TID) | ORAL | Status: DC

## 2015-08-11 MED ORDER — PRO-STAT SUGAR FREE PO LIQD
30.0000 mL | Freq: Two times a day (BID) | ORAL | Status: DC
Start: 2015-08-11 — End: 2017-06-18

## 2015-08-11 MED ORDER — JEVITY 1.5 CAL/FIBER PO LIQD: 275.0000 mL | Freq: Four times a day (QID) | ORAL | Status: DC

## 2015-08-11 NOTE — Discharge Instructions (Signed)
Discharge Date: 08/11/2015  Reason for Hospitalization:  You were admitted to the hospital for increased cough and shortness of breath. Found to be aspirating food and PEG tube placed this admission. Breathing was treated with antibiotics and steroids and improved.   Please follow-up with PCP and GI as scheduled.    Please take medications as prescribed, including tube feeds.  Please seek medical attention if you have any signs of infection, or any other concerning symptoms.  Thank you for letting us participate in your care!  Care of a Feeding Tube People who have trouble swallowing or cannot take food or medicine by mouth are sometimes given feeding tubes. A feeding tube can go into the nose and down to the stomach or through the skin in the abdomen and into the stomach or small bowel. Some of the names of these feeding tubes are gastrostomy tubes, PEG lines, nasogastric tubes, and gastrojejunostomy tubes.  SUPPLIES NEEDED TO CARE FOR THE TUBE SITE  Clean gloves.  Clean wash cloth, gauze pads, or soft paper towel.  Cotton swabs.  Skin barrier ointment or cream.  Soap and water.  Pre-cut foam pads or gauze (that go around the tube).  Tube tape. TUBE SITE CARE 1. Have all supplies ready and available. 2. Wash hands well. 3. Put on clean gloves. 4. Remove the soiled foam pad or gauze, if present, that is found under the tube stabilizer. Change the foam pad or gauze daily or when soiled or moist. 5. Check the skin around the tube site for redness, rash, swelling, drainage, or extra tissue growth. If you notice any of these, call your caregiver. 6. Moisten gauze and cotton swabs with water and soap. 7. Wipe the area closest to the tube (right near the stoma) with cotton swabs. Wipe the surrounding skin with moistened gauze. Rinse with water. 8. Dry the skin and stoma site with a dry gauze pad or soft paper towel. Do not use antibiotic ointments at the tube site. 9. If the skin is  red, apply a skin barrier cream or ointment (such as petroleum jelly) in a circular motion, using a cotton swab. The cream or ointment will provide a moisture barrier for the skin and helps with wound healing. 10. Apply a new pre-cut foam pad or gauze around the tube. Secure it with tape around the edges. If no drainage is present, foam pads or gauze may be left off. 11. Use tape or an anchoring device to fasten the feeding tube to the skin for comfort or as directed. Rotate where you tape the tube to avoid skin damage from the adhesive. 12. Position the person in a semi-upright position (30-45 degree angle). 13. Throw away used supplies. 14. Remove gloves. 15. Wash hands. SUPPLIES NEEDED TO FLUSH A FEEDING TUBE  Clean gloves.  60 mL syringe (that connects to the feeding tube).  Towel.  Water. FLUSHING A FEEDING TUBE  1. Have all supplies ready and available. 2. Wash hands well. 3. Put on clean gloves. 4. Draw up 30 mL of water in the syringe. 5. Kink the feeding tube while disconnecting it from the feeding-bag tubing or while removing the plug at the end of the tube. Kinking closes the tube and prevents secretions in the tube from spilling out. 6. Insert the tip of the syringe into the end of the feeding tube. Release the kink. Slowly inject the water. 7. If unable to inject the water, the person with the feeding tube should lay on his  or her left side. The tip of the tube may be against the stomach wall, blocking fluid flow. Changing positions may move the tip away from the stomach wall. After repositioning, try injecting the water again. 8. After injecting the water, remove the syringe. 9. Always flush before giving the first medicine, between medicines, and after the final medicine before starting a feeding. This prevents medicines from clogging the tube. 10. Throw away used supplies. 11. Remove gloves. 12. Wash hands.   This information is not intended to replace advice given to  you by your health care provider. Make sure you discuss any questions you have with your health care provider.   Document Released: 02/17/2005 Document Revised: 02/04/2012 Document Reviewed: 10/02/2011 Elsevier Interactive Patient Education [employer].

## 2015-08-11 NOTE — Care Management Note (Signed)
Case Management Note  Patient Details  Name: Frances Medina MRN: [id number] Date of Birth: 02/25/1961  Subjective/Objective:                  ESOPHAGOGASTRODUODENOSCOPY (EGD) (N/A) PERCUTANEOUS ENDOSCOPIC GASTROSTOMY (PEG) PLACEMENT (N/A) Action/Plan: Discharge planning Expected Discharge Date:  08/11/15               Expected Discharge Plan:  [place]  In-House Referral:     Discharge planning Services  CM Consult  Post Acute Care Choice:    Choice offered to:  Patient  DME Arranged:  Tube feeding, Tube feeding pump DME Agency:  [place]:  RN [facility] Agency:  [place]  Status of Service:  Completed, signed off  Medicare Important Message Given:    Date Medicare IM Given:    Medicare IM give by:    Date Additional Medicare IM Given:    Additional Medicare Important Message give by:     If discussed at [place] of Stay Meetings, dates discussed:    Additional Comments: CM met with pt and family in room to offer choice of home health agency.  Pt chooses [facility] to render [facility] for tube feedings.  Pt has an O2 order for night use only and does not qualify for O2 during awake or during ambulation (sats 90s). CM spoke with [facility] rep, tiffany then explained to pt: pt would need to sign a waiver which states if overnight O2 oximetry test does not qualify her for overnight home O2, she will be liable for the home O2 (approx 450.00/mos).  Pt declines home O2.  CM has requested of RN and has placed a call to MD([phone]) for correct feeding orders for a 90 day supply.  [facility] rep, Jonelle Sidle has referral and is waiting for tube feeding order to be placed.   Sherlin, [place], RN 08/11/2015, 11:51 AM

## 2015-08-11 NOTE — Progress Notes (Signed)
Family Medicine Teaching Service Daily Progress Note Intern Pager: [phone]  Patient name: Frances Medina Medical record number: [id number] Date of birth: [birth year] Age: 55 y.o. Gender: female  Primary Care Provider: Andrena Mews, MD Consultants: None Code Status: Full  Pt Overview and Major Events to Date:  6/4: Admitted to FMTS with shortness of breath, likely 2/2 to chronic aspiration. 6/5: FEES testing showed severe backflow from the cervical esophagus, leading to gross aspiration. 6/5: Esophagram showed no esophageal obstruction or narrowing, decreased mid and distal esophageal motility with frequent to and fro motion of retained contrast in the cervical and proximal thoracic esophagus, and reflux of the contrast from the esophagus into the hypopharynx. 6/9: PEG placed by GI  Assessment and Plan: Frances Medina is a 55 y.o. female presenting with cough, shortness of breath, and difficulty swallowing. PMH is significant for myasthenia gravis, HFpEF, splenomegaly, HTN, depression/anxiety.  GI: Severe chronic dysphagia:  - PEG placed on 6/9 was uncomplicated. Tolerating tube feeds - Will follow-up GI recs for discharge - Speech recommending minimal oral intake for pleasure, therapy, and to combat disuse atrophy. - Resume outpatient SLP on discharge.  Resp: Chronic aspiration pneumonia/pneumonitis: Stable on room air during day but requires O2 at night for comfort. On exam, mild expiratory wheezing auscultated. - s/p 5 day course of Azithromycin and prednisone - Blood cultures drawn in ED on 6/4 grew gram positive rods in 1 bottle. No bacteria was detected on the initial ID panel. Likely contaminant. Awaiting speciation.  - Albuterol q4hrs prn. - Would still recommend PFTs as outpatient, especially given her 20 pack year smoking history. - PT/OT consult- no PT or OT f/u -DME order for home O2 given for night use only  Neuro: Myasthenia Gravis: Stable. Follows with Dr. Porfirio Oar  (Neurology) at [facility]. Also, just started speech therapy for assistance with swallowing this past week. Pt states she is not having any weakness at this time. - Continue home med: Pyridostigmine 60mg  qid and 180mg  at bedtime - Speech consult, as above.  CV: HFpEF: Stable. ECHO 04/09/15 performed due to SOB showed EF of 60-65% and G1DD. Patient currently appears euvolemic. BNP 20 this admission. - Continue to monitor  Heme: Splenomegaly w/Lymphadenopathy: Being followed by Oncologist Dr. Irene Limbo. Work-up thus far has shown HIV neg, mono screen neg, elevated LDH at 395 and low haptoglobin at <10. Lymphoma ruled out with PET scan performed on 3/13. Stable labs.  Hx of Normocytic Anemia: Also followed by Dr. Irene Limbo (heme-onc). Last anemia panel 04/2015: Iron 76 (nl), TIBC 286 (nl), ferritin 688 (high). Has had elevated LDH, low haptoglobin, increased reticulocyte count, and spherocytes on peripheral blood smear in the past. 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 Hgb stable at 12.4. - Continue home ferrous sulfate 325mg  bid - Miralax prn  History of HTN: Elevated overnight.  - Not on any home anti-hypertensives - Continue to monitor. Will add prn if needed.  - Recommend follow-up as an outpatient  Seasonal allergies: Pt endorses rhinorrhea, itchy eyes, sore throat - Continue Claritin and ocean nasal spray  Psych: Depression and Anxiety: Tearful this morning. - Zoloft 50mg  daily, Klonopin 0.5-1mg  bid prn  GERD: - Continue home Pepcid  FEN/GI: Dysphagia I diet per speech recommendations. Prophylaxis: Lovenox  Disposition: Plan for discharge home today.  Subjective:  Pt states she is doing really well this morning. She is ready to go home. Denies any comfort with PEG tube and is tolerating feeds.    Objective: Temp:  [  97.5 F (36.4 C)-98.1 F (36.7 C)] 98.1 F (36.7 C) (06/10 0421) Pulse Rate:  [67-102] 71 (06/10 0421) Resp:  [15-24] 16 (06/10 0421) BP: (130-181)/(77-100) 157/85  mmHg (06/10 0421) SpO2:  [96 %-99 %] 99 % (06/10 0421) Weight:  [238 lb (107.956 kg)] 238 lb (107.956 kg) (06/09 0939) Physical Exam: General: Well-appearing female, sitting up in chair, in NAD, pleasant HEENT: [place]/AT, EOMI,  MMM, hoarse voice. Cardiovascular: RRR, no murmurs,  Respiratory: Normal work of breathing, mild diffuse expiratory wheezes Abdomen: +BS, soft, non-tender, non-distended Skin: No rashes, no lesions Neuro: Awake, alert, oriented, no focal deficits.   Laboratory:  Recent Labs Lab 08/05/15 1022 08/06/15 0643 08/07/15 0904  WBC 9.4 6.6 5.9  HGB 13.1 11.8* 12.4  HCT 42.8 40.4 41.7  PLT 273 237 258    Recent Labs Lab 08/05/15 1022 08/06/15 0643 08/07/15 0435  NA 138 141 142  K 3.7 3.1* 3.7  CL 102 104 106  CO2 28 29 30   BUN 9 <5* <5*  CREATININE 0.68 0.63 0.51  CALCIUM 9.0 8.8* 9.1  PROT 6.6  --   --   BILITOT 0.4  --   --   ALKPHOS 76  --   --   ALT 23  --   --   AST 22  --   --   GLUCOSE 132* 105* 97   BNP 20  Imaging/Diagnostic Tests: -CXR (6/4): Bibasilar atelectasis, low lung volumes -Esophagram (6/5): No esophageal obstruction or narrowing. A 12.5 mm barium tablet passed freely to the stomach without delay, Decreased mid and distal esophageal motility with frequent to and fro motion of retained contrast in the cervical and proximal thoracic esophagus. While prone reflux of this contrast from the esophagus into the hypopharynx was observed, Intermittent small volume of barium aspiration which in general did not elicit a cough, No gastroesophageal reflux occurred spontaneously or was elicited.  Katheren Shams, DO 08/11/2015, 8:03 AM PGY-2, [place] Intern pager: [phone], text pages welcome

## 2015-08-11 NOTE — Progress Notes (Signed)
CROSS COVER [facility] Subjective: [name] is a 55 year old black female with multiple medical problems including myasthenia gravis, protein calorie malnutrition recurrent aspiration feeding difficulties, who had a PEG placed by Dr. Silvano Rusk yesterday. Post PEG check was requested by the family practice resident prior to discharge. Patient denies any abdominal pain nausea vomiting diarrhea constipation she is passing flatus. The only concern she has with increased abdominal girth after the PEG placement.  Objective : Vital signs in last 24 hours: Temp:  [97.7 F (36.5 C)-98.1 F (36.7 C)] 98.1 F (36.7 C) (06/10 0421) Pulse Rate:  [67-83] 71 (06/10 0421) Resp:  [16] 16 (06/10 0421) BP: (130-157)/(77-85) 157/85 mmHg (06/10 0421) SpO2:  [98 %-99 %] 99 % (06/10 0421) Last BM Date: 08/09/15  Intake/Output from previous day: 06/09 0701 - 06/10 0700 In: 2065 [P.O.:240; I.V.:1525; NG/GT:300] Out: -  Intake/Output this shift: Total I/O In: 140 [NG/GT:140] Out: -   General appearance: alert, cooperative, appears stated age, no distress, morbidly obese and pale Resp: clear to auscultation bilaterally Cardio: regular rate and rhythm, S1, S2 normal, no murmur, click, rub or gallop GI: soft, non-tender; bowel sounds normal; no masses,  no organomegaly  Medications: I have reviewed the patient's current medications.  Assessment/Plan: Feeding difficulties/dysphagia [MS] with recurrent aspiration/PCM:  The PEG seems to be in good position with the feedings infusing appropriately I do not see any problem with discharging the patient at this time. The family is being instructed how to use the PEG at home.  LOS: 3 days   Frances Medina 08/11/2015, 12:49 PM

## 2015-08-11 NOTE — Progress Notes (Signed)
Patient ambulated length of hallway. Oxygen saturation dropped from 98 to 95% and heart rate increased to 125 during ambulation. Patient complained of some fatigue, but tolerated well. Florian Buff, RN

## 2015-08-13 ENCOUNTER — Telehealth: Payer: Self-pay | Admitting: *Deleted

## 2015-08-13 ENCOUNTER — Other Ambulatory Visit: Payer: Self-pay | Admitting: Family Medicine

## 2015-08-13 ENCOUNTER — Encounter: Payer: Self-pay | Admitting: Speech Pathology

## 2015-08-13 ENCOUNTER — Ambulatory Visit: Payer: BC Managed Care – PPO | Admitting: Speech Pathology

## 2015-08-13 DIAGNOSIS — R1310 Dysphagia, unspecified: Secondary | ICD-10-CM

## 2015-08-13 DIAGNOSIS — G7000 Myasthenia gravis without (acute) exacerbation: Secondary | ICD-10-CM

## 2015-08-13 NOTE — Telephone Encounter (Signed)
Order placed on Epic. Please print and fax. Thank you.

## 2015-08-13 NOTE — Telephone Encounter (Signed)
Estill Bamberg, RN with [facility] regarding orders for speech therapy with [facility].  Patient was admitted to [facility] yesterday. Insurance will not cover speech therapy if it is not with [facility].  Also requested medication for yeast infection under patient's arm.  Please give her a call at [phone].  Derl Barrow, RN

## 2015-08-13 NOTE — Telephone Encounter (Signed)
Pt has an appt tomorrow with Dr. Ardelia Mems. Thomasine Klutts,CMA

## 2015-08-13 NOTE — Telephone Encounter (Signed)
Order faxed. Marlisa Caridi,CMA  

## 2015-08-13 NOTE — Therapy (Signed)
[facility] [address] [place], [place], [address] Phone: [phone]   Fax:  [phone]  Patient Details  Name: Frances Medina MRN: [id number] Date of Birth: 1960-09-07 Referring Provider:  No ref. provider found  Encounter Date: 08/13/2015   SPEECH THERAPY DISCHARGE SUMMARY  Visits from Start of Care: 2  Current functional level related to goals / functional outcomes:     SLP Long Term Goals - 08/03/15 1504    SLP LONG TERM GOAL #1   Title Pt will follow diet modifications and swallow precautions with rare min A over 3 sessions.   Time 4   Period Weeks   Status Not met   SLP LONG TERM GOAL #2   Title Pt will verbalize s/s of aspiration pna with rare min A   Time 4   Period Weeks   Status Not met   SLP LONG TERM GOAL #3   Title Pt will verbalize energy conservation strategies for maximized PO intake and report following these at home over 2 sessions.    Time 4   Period Weeks   Status Not met       Remaining deficits: Severe oropharyngeal dysphagia with aspiration pna. Pt d/c'd due to hospitalization with pna - PEG tube feed placed. Pt now home with [facility]   Education / Equipment: Diet modifications, energy conservation of PO intake due to muscle fatigue, Plan: Patient agrees to discharge.  Patient goals were not met. Patient is being discharged due to a change in medical status.  ?????        Lov[place], Annye Rusk  MS, CCC-SLP  08/13/2015, 12:05 PM  [facility] [address] [place], [place], [address] Phone: [phone]   Fax:  [phone]

## 2015-08-13 NOTE — Telephone Encounter (Signed)
Not certain if the lesion on her armpit is fungal or not. Could you please advise her to come in to be seen soon. Thanks.

## 2015-08-13 NOTE — Telephone Encounter (Signed)
Patient called need orders placed with [facility] for speech therapy continuation.  These can be faxed to [phone].  Please call patient with any questions. Jazmin Hartsell,CMA

## 2015-08-13 NOTE — Progress Notes (Signed)
[facility] for speech therapy continuation

## 2015-08-14 ENCOUNTER — Encounter: Payer: Self-pay | Admitting: Family Medicine

## 2015-08-14 ENCOUNTER — Ambulatory Visit (INDEPENDENT_AMBULATORY_CARE_PROVIDER_SITE_OTHER): Payer: BC Managed Care – PPO | Admitting: Family Medicine

## 2015-08-14 ENCOUNTER — Ambulatory Visit (HOSPITAL_COMMUNITY): Payer: BC Managed Care – PPO | Admitting: Psychiatry

## 2015-08-14 VITALS — BP 135/77 | HR 89 | Temp 97.8°F | Ht 60.0 in | Wt 240.0 lb

## 2015-08-14 DIAGNOSIS — G4734 Idiopathic sleep related nonobstructive alveolar hypoventilation: Secondary | ICD-10-CM

## 2015-08-14 DIAGNOSIS — G7000 Myasthenia gravis without (acute) exacerbation: Secondary | ICD-10-CM | POA: Diagnosis not present

## 2015-08-14 MED ORDER — ALBUTEROL SULFATE HFA 108 (90 BASE) MCG/ACT IN AERS: 2.0000 | INHALATION_SPRAY | RESPIRATORY_TRACT | Status: DC | PRN

## 2015-08-14 MED ORDER — NYSTATIN 100000 UNIT/GM EX CREA: 1.0000 "application " | TOPICAL_CREAM | Freq: Two times a day (BID) | CUTANEOUS | Status: DC

## 2015-08-14 NOTE — Progress Notes (Signed)
  HPI:  Frances Medina presents for hospital follow up. Patient was hospitalized from 08/05/15 to 08/11/15 with shortness of breath and difficulty swallowing, deemed likely due to presumed aspiration pneumonia/pneumonitis. Treated with azithromycin and prednisone, in addition to albuterol. Weaned to room air prior to discharge.  Regarding dysphagia - underwent fairly large workup and was found to be grossly aspirating. PEG tube was placed on 6/9. Discharged home with [facility] RN and outpatient speech therapy.  Patient now reports she's doing well. She scheduled the visit with me today out of concern for a rash beneath her armpits. Mildly itchy. Red in each area.  Also notes numbness of her R fifth finger. Had an IV placed in that site during her PEG tube placement procedure. Can feel touch, it is just diminished.  Has been using PEG at home without complication. Neither patient nor her daughters have questions about PEG tube at this time. Has gotten [facility] services. Using PEG for feeds and liquid intake. Takes medications by mouth still. She needs a referral to [facility] speech therapy as her insurance will not pay for both [facility] nursing services and outpatient speech therapy.  Also notes occasional blurry vision since being diagnosed with myasthenia gravis. Has not seen an ophthalmologist since this diagnosis. Has glasses, goes to optometry at [facility] for those.  Of note - discharge summary recommends outpatient PFTs and sleep study due to the pulmonary issues she had in the hospital. Breathing well now, no concerns. Agreeable to have both of these tests done.  Medication reconciliation completed. Needs rx for albuterol inhaler, it is on her medication list but has never been given one.  ROS: See HPI.  [place]: history of myasthenia gravis previously requiring intubation and tracheostomy, knee osteoarthritis, hypertension, anxiety/depression, GERD  PHYSICAL EXAM: BP 135/77 mmHg  Pulse 89  Temp(Src) 97.8 F (36.6  C) (Oral)  Ht 5' (1.524 m)  Wt 240 lb (108.863 kg)  BMI 46.87 kg/m2 Gen: NAD, pleasant, cooperative HEENT: normocephalic, atraumatic, moist mucous membranes, pupils equal round and reactive to light. Extraocular movements intact  Heart: regular rate and rhythm, no murmur Lungs: clear to auscultation bilaterally, normal work of breathing  Neuro: alert, grossly nonfocal, speech normal Ext: R hand with mild bruising over dorsal aspect. Grip 5/5 bilaterally. Sensation intact but slightly diminished over R 4th and 5th fingers. No wrist tenderness. No warmth or erythema overlying hand. Skin: erythematous plaque in intertriginous area of bilateral axillae, consistent with candida  ASSESSMENT/PLAN:  Frances Medina is a [name] presenting for hospital follow up. Doing well after her hospitalization.  1. Intertrigo - clearly candidal on exam. Will rx nystatin cream.  2. Nocturnal hypoxia in hospital - now without respiratory complaints. Sleep study ordered. Instructed patient to schedule appointment with Dr. Valentina Lucks for PFTs. She is agreeable. Rx albuterol inhaler so she has this on hand.  3. Chronic aspiration - home health speech ordered per insurance requirements. Continue PEG tube feeds, tolerating well.  4. Vision issues - not a main focus of today's visit. Has not been evaluated by ophthalmology since dx with myasthenia gravis. Will enter referral to ophtho.  5. R hand numbness - suspect nerve irritation from IV. Anticipate it will improve with time. Will monitor for now.  FOLLOW UP: Follow up in 4 weeks with PCP for chronic medical issues, sooner if needed. Referring to ophthalmology Schedule PFTs with Dr. Valentina Lucks.  Delorse Limber. Ardelia Mems, [place]

## 2015-08-14 NOTE — Patient Instructions (Signed)
Finger numbness - hopefully this will go away with time  Rash - appears like yeast infection. Use nystatin cream. Sent this in for you.  Ordered home health speech therapy  Referring to eye doctor (ophthalmologist)  Setting up sleep study - someone will call you to schedule  Schedule an appointment with Dr. Valentina Lucks to have pulmonary function testing done.  Follow up with Dr. Gwendlyn Deutscher in a month or so, sooner if you have any concerns.  Be well, Dr. Ardelia Mems

## 2015-08-17 ENCOUNTER — Telehealth: Payer: Self-pay | Admitting: *Deleted

## 2015-08-17 MED ORDER — ALBUTEROL SULFATE HFA 108 (90 BASE) MCG/ACT IN AERS: 2.0000 | INHALATION_SPRAY | RESPIRATORY_TRACT | Status: DC | PRN

## 2015-08-17 NOTE — Telephone Encounter (Signed)
Prior Authorization received from [employer] for Ventolin HFA. Formulary preferred is ProAir.  ProAir sent in to [facility].  Derl Barrow, RN

## 2015-08-20 ENCOUNTER — Telehealth: Payer: Self-pay | Admitting: *Deleted

## 2015-08-20 NOTE — Telephone Encounter (Signed)
Please call to give verbal order. Approved by me. Thanks.

## 2015-08-20 NOTE — Telephone Encounter (Signed)
Verbal orders given. Page, cma.  

## 2015-08-20 NOTE — Telephone Encounter (Signed)
Needs verbal orders for swallow therapy for 2x a week for the next 4 weeks.  Patient was evaluated today by speech therapy. Please contact Bryson Ha with orders [phone]. Jazmin Hartsell,CMA

## 2015-08-22 ENCOUNTER — Telehealth: Payer: Self-pay | Admitting: Family Medicine

## 2015-08-22 NOTE — Telephone Encounter (Signed)
Patient contacted our office wanting her home nurse to talk with me. I spoke with Alonna Minium who informed me that her BP was elevated today at 173/110 on two readings. She is completely asymptomatic. I reviewed her v/s flow sheet,her BP had alternated between normal and high. I recommended that she comes in to be seen. She has an appointment this Friday with Dr. Valentina Lucks. He can help recheck her BP and start medication if appropriate. Patient to monitor BP at home, if worsening and she becomes symptomatic, she is to go to the hospital. Her nurse stated she will relay information to her.  I will forward message to Dr. Valentina Lucks.

## 2015-08-24 ENCOUNTER — Ambulatory Visit (INDEPENDENT_AMBULATORY_CARE_PROVIDER_SITE_OTHER): Payer: BC Managed Care – PPO | Admitting: Pharmacist

## 2015-08-24 ENCOUNTER — Encounter: Payer: Self-pay | Admitting: Pharmacist

## 2015-08-24 VITALS — BP 119/66 | HR 61 | Ht 61.0 in | Wt 247.8 lb

## 2015-08-24 DIAGNOSIS — R0602 Shortness of breath: Secondary | ICD-10-CM

## 2015-08-24 DIAGNOSIS — I10 Essential (primary) hypertension: Secondary | ICD-10-CM | POA: Diagnosis not present

## 2015-08-24 NOTE — Progress Notes (Signed)
Patient ID: Frances Medina, female   DOB: May 17, 1960, 55 y.o.   MRN: [id number] Reviewed: Agree with Dr. Graylin Shiver documentation and management.

## 2015-08-24 NOTE — Progress Notes (Signed)
   S:    Patient arrives in good spirits. Daughter joined [place] for some of the visit. Presents for lung function evaluation.   Patient was referred on 08/14/2015.  Patient was last seen by Primary Care Provider on 08/14/2015.   Patient reports breathing has been diminished since time of diagnosis.   She reports shortness of breath with climbing one flight of stairs with requires her to stop to catch her breath for 30 seconds prior to climbing a second flight of stairs. Denies history of meds that could decrease lung function or employment where she would be around harmful fumes or harsh chemicals. Patient reports SOB when around strong cleaning supplies such as bleach (within the past 6 months).  Patient reports she has been prescribed an albuterol inhaler at last visit.  O:  See "scanned report" or Documentation Flowsheet (discrete results - PFTs) for  Spirometry results. Patient provided good effort while attempting spirometry.   Albuterol Neb  Lot# [id number]      Exp. Jan 2019 Filed Vitals:   08/24/15 0845  BP: 119/66  Pulse: 61    A/P:  Spirometry evaluation reveals moderate-severe restrictive lung disease. Most likely due to myasthenia gravis and barotrauma with recent intubations. Post nebulized albuterol tx revealed improvement in lung function. Instructed patient to start using albuterol inhaler 2 puffs every 6 hours as needed. Educated patient on purpose, proper use, potential adverse effects including jitteriness. Reviewed results of pulmonary function tests.  Pt verbalized understanding of results and education.  Written pt instructions provided.   Hx of hypertension with recent elevated BP reading at home (yesterday).   Repeat BP today in office is at goal.  No changes at this time.   F/U Clinic visit schedule with Dr. Gwendlyn Deutscher on 09/14/15.   Total time in face to face counseling 45 minutes.  Patient seen with Mikael Spray, PharmD Candidate and Joya San, PharmD [place]

## 2015-08-24 NOTE — Patient Instructions (Addendum)
Thank you for coming in today.  Your tests showed moderate-severe restrictive lung disease. Start using your albuterol inhaler 2 puffs every 6 hours as needed prior to exercise.

## 2015-08-24 NOTE — Assessment & Plan Note (Signed)
Spirometry evaluation reveals moderate-severe restrictive lung disease. Most likely due to myasthenia gravis and barotrauma with recent intubations. Post nebulized albuterol tx revealed improvement in lung function. Instructed patient to start using albuterol inhaler 2 puffs every 6 hours as needed. Educated patient on purpose, proper use, potential adverse effects including jitteriness. Reviewed results of pulmonary function tests.  Pt verbalized understanding of results and education.  Written pt instructions provided.

## 2015-08-24 NOTE — Assessment & Plan Note (Signed)
Hx of hypertension with recent elevated BP reading at home (yesterday).   Repeat BP today in office is at goal.  No changes at this time.

## 2015-08-27 ENCOUNTER — Encounter: Payer: BC Managed Care – PPO | Admitting: Speech Pathology

## 2015-08-30 ENCOUNTER — Encounter: Payer: BC Managed Care – PPO | Admitting: Speech Pathology

## 2015-08-30 ENCOUNTER — Other Ambulatory Visit: Payer: Self-pay | Admitting: Family Medicine

## 2015-08-30 NOTE — Telephone Encounter (Signed)
Please inform patient. I just contacted her pharmacy. She has two more refills and her refill is ready for pick up today. Thanks.

## 2015-08-30 NOTE — Telephone Encounter (Signed)
Needs refill on clonopin. [facility] on [address]

## 2015-08-30 NOTE — Telephone Encounter (Signed)
Patient is aware of this and will pick up medication today. Jazmin Hartsell,CMA

## 2015-09-03 ENCOUNTER — Encounter: Payer: BC Managed Care – PPO | Admitting: Speech Pathology

## 2015-09-05 ENCOUNTER — Encounter: Payer: Self-pay | Admitting: Family Medicine

## 2015-09-05 NOTE — Progress Notes (Signed)
Patient ID: Frances Medina, female   DOB: 1961-02-05, 55 y.o.   MRN: [id number] I got an Rx request for sterile glove. Per her daughter and Epic note she uses it for PEG dressing daily. Rx signed and faxed to [facility].

## 2015-09-06 ENCOUNTER — Encounter: Payer: BC Managed Care – PPO | Admitting: Speech Pathology

## 2015-09-10 ENCOUNTER — Telehealth: Payer: Self-pay | Admitting: *Deleted

## 2015-09-10 NOTE — Telephone Encounter (Signed)
Please have her see [place] soon if this becomes bothersome. Also advised ED visit if worsening.

## 2015-09-10 NOTE — Telephone Encounter (Signed)
Pt has an apt this Friday at 11am with Dr. Gwendlyn Deutscher. Page, cma.

## 2015-09-10 NOTE — Telephone Encounter (Signed)
Alonna Minium, nurse with [facility] states she saw patient today and she is complaining of frequent headaches that she rates at about a 7. Headaches are helping with ibuprofen but she is having them daily. Patient is also reporting trouble sleeping but she is drinking coffee at night which Ebony Hail has told her to stop doing to see if that helps. FYI to PCP.

## 2015-09-14 ENCOUNTER — Encounter: Payer: Self-pay | Admitting: Family Medicine

## 2015-09-14 ENCOUNTER — Ambulatory Visit (INDEPENDENT_AMBULATORY_CARE_PROVIDER_SITE_OTHER): Payer: BC Managed Care – PPO | Admitting: Family Medicine

## 2015-09-14 VITALS — BP 140/85 | HR 79 | Temp 98.3°F | Ht 61.0 in | Wt 251.0 lb

## 2015-09-14 DIAGNOSIS — R161 Splenomegaly, not elsewhere classified: Secondary | ICD-10-CM

## 2015-09-14 DIAGNOSIS — Z1159 Encounter for screening for other viral diseases: Secondary | ICD-10-CM | POA: Diagnosis not present

## 2015-09-14 DIAGNOSIS — Z0000 Encounter for general adult medical examination without abnormal findings: Secondary | ICD-10-CM

## 2015-09-14 DIAGNOSIS — R200 Anesthesia of skin: Secondary | ICD-10-CM

## 2015-09-14 DIAGNOSIS — R202 Paresthesia of skin: Secondary | ICD-10-CM | POA: Diagnosis not present

## 2015-09-14 DIAGNOSIS — R519 Headache, unspecified: Secondary | ICD-10-CM | POA: Insufficient documentation

## 2015-09-14 DIAGNOSIS — G7000 Myasthenia gravis without (acute) exacerbation: Secondary | ICD-10-CM

## 2015-09-14 DIAGNOSIS — I10 Essential (primary) hypertension: Secondary | ICD-10-CM

## 2015-09-14 DIAGNOSIS — R51 Headache: Secondary | ICD-10-CM | POA: Diagnosis not present

## 2015-09-14 NOTE — Patient Instructions (Signed)
It was nice seeing you today. I am glad you look much better today. Sorry about your headache, likely related to some of the medicine you are taking. If you do not really need the estrace please gradually get off it. Use Ibuprofen as needed for headache..  Also remember to schedule your sleep study.  Obtain wrist brace for your finger pain. If it persists we will do nerve study. We will obtain Hepatitis C test today. Please come see me soon for physical exam.

## 2015-09-14 NOTE — Assessment & Plan Note (Signed)
Unable to go for follow up due to multiple hospitalization and doctors visit. She is currently asymptomatic. She stated she will call her hematologist to schedule follow up soon.

## 2015-09-14 NOTE — Assessment & Plan Note (Signed)
Hep C screening recommended and she agreed with plan. F/U for CPE to discuss PAP and colonoscopy.

## 2015-09-14 NOTE — Assessment & Plan Note (Signed)
BP continues to fluctuate. A bit elevated today. Will still prefer to watch now given that she is already on so many medication. Perhaps prednisone is a contributor to this. Reduce salt intake, exercise for weight loss as tolerated. Continue home BP monitoring. Will reassess at next visit.

## 2015-09-14 NOTE — Progress Notes (Signed)
Subjective:     Patient ID: Frances Medina, female   DOB: Jul 04, 1960, 55 y.o.   MRN: [id number]  Headache  This is a new problem. Episode onset: few days ago, less than a week. The problem occurs intermittently ( No headache right now). The problem has been unchanged. The pain is located in the occipital and left unilateral region. The pain does not radiate. The pain quality is similar to prior headaches (started before she found out she has MG). The quality of the pain is described as sharp. The pain is at a severity of 7/10 (Currently does not have headache. She usually have HA during the day usually when she wakes up from sleep. Feels she didnt get enough sleep). The pain is moderate. Pertinent negatives include no anorexia, blurred vision, ear pain, eye pain, eye redness, facial sweating, fever, hearing loss, nausea, phonophobia, photophobia, seizures, tingling, visual change or vomiting. Associated symptoms comments: Tinnitus, also some heaviness and weakness of the right side of her face. Felt like when she had MG flare in the past.. Exacerbated by: Occurs when she wakes up from sleep. She has tried NSAIDs for the symptoms. The treatment provided moderate relief. Her past medical history is significant for migraine headaches. There is no history of recent head traumas.  Hand numbness: C/O right ring and little finger tingling and numbing sensation since her last hospitalization, she stated it started from the time she had IV line inserted. Symptoms is intermittent and worse with certain motion. She denies pain. MG: She is compliant with her Steroid and Mestinon. No new complaints. Splenomegaly: Had not follow up with Heme/Oncology. Elevated BP: Here for follow up. HM: Need up date. Here for follow up.  Current Outpatient Prescriptions on File Prior to Visit  Medication Sig Dispense Refill  . albuterol (PROAIR HFA) 108 (90 Base) MCG/ACT inhaler Inhale 2 puffs into the lungs every 4 (four) hours as  needed for wheezing or shortness of breath. 1 Inhaler 0  . Amino Acids-Protein Hydrolys (FEEDING SUPPLEMENT, PRO-STAT SUGAR FREE 64,) LIQD Place 30 mLs into feeding tube 2 (two) times daily. 900 mL 0  . clonazePAM (KLONOPIN) 1 MG tablet Take 0.5-1 tablets (0.5-1 mg total) by mouth 2 (two) times daily as needed for anxiety. 45 tablet 2  . estradiol (ESTRACE) 0.5 MG tablet Take 0.5 mg by mouth daily.    . ferrous sulfate 325 (65 FE) MG tablet Take 1 tablet (325 mg total) by mouth 2 (two) times daily with a meal. (Patient taking differently: Take 325 mg by mouth daily. ) 60 tablet 0  . nystatin cream (MYCOSTATIN) Apply 1 application topically 2 (two) times daily. 30 g 0  . omeprazole (PRILOSEC) 20 MG capsule Take 20 mg by mouth daily.    . predniSONE (DELTASONE) 10 MG tablet Take 10 mg by mouth daily with breakfast. On taper per neuro    . pyridostigmine (MESTINON) 180 MG CR tablet Take 180 mg by mouth at bedtime.    . pyridostigmine (MESTINON) 60 MG tablet Take 1 tablet (60 mg total) by mouth 4 (four) times daily. 120 tablet 1  . sertraline (ZOLOFT) 100 MG tablet Take 50 mg by mouth 2 (two) times daily.     . Vitamin D, Ergocalciferol, (DRISDOL) 50000 units CAPS capsule Take 1 capsule (50,000 Units total) by mouth every 7 (seven) days. 4 capsule 1  . ibuprofen (ADVIL,MOTRIN) 200 MG tablet Take 1,000 mg by mouth as needed. Reported on 09/14/2015    . lactose  free nutrition (BOOST PLUS) LIQD Take 237 mLs by mouth 3 (three) times daily with meals. 237 mL 2  . Nutritional Supplements (FEEDING SUPPLEMENT, JEVITY 1.5 CAL/FIBER,) LIQD Place 275 mLs into feeding tube 4 (four) times daily. 1000 mL 0   No current facility-administered medications on file prior to visit.   Past Medical History  Diagnosis Date  . GERD (gastroesophageal reflux disease)     hiatal hernia  . 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 Knee injury   . Depression with anxiety   . Seasonal allergies     takes Zytrec  . Fibroid uterus      size of a dime  . Umbilical hernia     watching , no plans for surgery at present  . Hypertension     "went away when I stopped smoking"  . Migraine     "maybe couple times/month" (03/20/2015)  . Osteoarthritis of right knee 08/30/2013  . Osteoarthritis of left knee 12/02/2013  . Myasthenia gravis ([place]) 2017  . E. coli UTI 04/07/2015  . Dysphagia 03/2015     EGD, Dr Carlean Purl. mild antral gastritis, ? due to Ibuprofen.  no stricture but empirically maloney dilated esophagus.   . Acute respiratory failure ([place])   . Acute respiratory failure with hypoxemia ([place])   . VAP (ventilator-associated pneumonia) ([place])   . CAP (community acquired pneumonia) 08/05/2015  . Enteritis due to Clostridium difficile   . Myasthenia gravis with acute exacerbation ([place]) 05/15/2015  . Endotracheally intubated   . Tracheostomy status ([place])   . Protein-calorie malnutrition, severe ([place]) 08/07/2015  . Aspiration into airway      Review of Systems  Constitutional: Negative for fever.  HENT: Negative for ear pain and hearing loss.   Eyes: Negative for blurred vision, photophobia, pain and redness.  Respiratory: Negative.   Cardiovascular: Negative.   Gastrointestinal: Negative.  Negative for nausea, vomiting and anorexia.  Neurological: Positive for headaches. Negative for tingling and seizures.       Numbness and tingling finger  Psychiatric/Behavioral: Negative.   All other systems reviewed and are negative.  Filed Vitals:   09/14/15 1104  BP: 140/85  Pulse: 79  Temp: 98.3 F (36.8 C)  TempSrc: Oral  Height: 5\' 1"  (1.549 m)  Weight: 251 lb (113.853 kg)       Objective:   Physical Exam  Constitutional: She is oriented to person, place, and time. She appears well-developed. No distress.  HENT:  Head: Normocephalic.    Right Ear: Tympanic membrane, external ear and ear canal normal.  Left Ear: Tympanic membrane, external ear and ear canal normal.  Mouth/Throat: Uvula is midline.  Eyes: Conjunctivae  and EOM are normal. Pupils are equal, round, and reactive to light.  Cardiovascular: Normal rate, regular rhythm and normal heart sounds.   No murmur heard. Pulmonary/Chest: Effort normal and breath sounds normal. No respiratory distress. She has no wheezes. She exhibits no tenderness.  Abdominal: Soft. Bowel sounds are normal. She exhibits no distension and no mass. There is no tenderness.  Musculoskeletal: She exhibits no edema.  Neurological: She is alert and oriented to person, place, and time. She has normal strength and normal reflexes. No cranial nerve deficit or sensory deficit. She displays a negative Romberg sign. She displays no Babinski's sign on the right side. She displays no Babinski's sign on the left side.  Psychiatric: She has a normal mood and affect.  Nursing note and vitals reviewed.  Assessment:     Headache Paraesthesia right ring and little finger.  Myasthenia Gravis Splenomegaly Elevated BP Health maintenance.    Plan:     Check problem list.

## 2015-09-14 NOTE — Assessment & Plan Note (Signed)
Right ring and little finger. Sounds more like ulnar tunnel syndrome. Recommended writs splinting and discuss elbow positioning to relieve symptoms. Advised if this persist she will benefit from [facility] and EMG. Will hold off on Gabapentin for now since it is mild and intermittent. F/U soon if no improvement.

## 2015-09-14 NOTE — Assessment & Plan Note (Addendum)
No neurologic deficit. Etiology unclear. Differentials include medication induced or stress vs Migraine vs OSA given hx of desaturation when sleeping. Hx of Migraine headache in the past but unusual presentation. CT and MRI done in Jan were neg for intracranial pathology. Medication reviewed: Mestinon, Prednisone and Estradiol she is taking all can cause headache. Patient advised to gradually get off Estradiol if she is having less hot flashes. Advised to schedule her sleep study soon to assess for sleep apnea. She stated she will do so as soon as possible. Tylenol or ibuprofen recommended prn headache.  Return precaution discussed.

## 2015-09-14 NOTE — Assessment & Plan Note (Signed)
Compliant with medication and neurology follow up. Continue management by her neurologist.

## 2015-09-15 LAB — HEPATITIS C ANTIBODY: HCV Ab: NEGATIVE

## 2015-10-10 ENCOUNTER — Encounter (HOSPITAL_COMMUNITY): Payer: Self-pay | Admitting: Emergency Medicine

## 2015-10-10 ENCOUNTER — Emergency Department (HOSPITAL_COMMUNITY)
Admission: EM | Admit: 2015-10-10 | Discharge: 2015-10-10 | Disposition: A | Discharge status: Home / Self Care | Payer: BC Managed Care – PPO | Attending: Emergency Medicine | Admitting: Emergency Medicine

## 2015-10-10 ENCOUNTER — Emergency Department (HOSPITAL_COMMUNITY): Payer: BC Managed Care – PPO

## 2015-10-10 ENCOUNTER — Telehealth: Payer: Self-pay | Admitting: *Deleted

## 2015-10-10 DIAGNOSIS — I10 Essential (primary) hypertension: Secondary | ICD-10-CM | POA: Diagnosis not present

## 2015-10-10 DIAGNOSIS — R0602 Shortness of breath: Secondary | ICD-10-CM | POA: Insufficient documentation

## 2015-10-10 DIAGNOSIS — Z96651 Presence of right artificial knee joint: Secondary | ICD-10-CM | POA: Insufficient documentation

## 2015-10-10 DIAGNOSIS — Z87891 Personal history of nicotine dependence: Secondary | ICD-10-CM | POA: Insufficient documentation

## 2015-10-10 DIAGNOSIS — Z79899 Other long term (current) drug therapy: Secondary | ICD-10-CM | POA: Diagnosis not present

## 2015-10-10 DIAGNOSIS — R0789 Other chest pain: Secondary | ICD-10-CM | POA: Diagnosis present

## 2015-10-10 LAB — CBC WITH DIFFERENTIAL/PLATELET
Basophils Absolute: 0 10*3/uL (ref 0.0–0.1)
Basophils Relative: 0 %
Eosinophils Absolute: 0 10*3/uL (ref 0.0–0.7)
Eosinophils Relative: 0 %
HCT: 40.4 % (ref 36.0–46.0)
Hemoglobin: 12.1 g/dL (ref 12.0–15.0)
Lymphocytes Relative: 18 %
Lymphs Abs: 2.1 10*3/uL (ref 0.7–4.0)
MCH: 22.4 pg — ABNORMAL LOW (ref 26.0–34.0)
MCHC: 30 g/dL (ref 30.0–36.0)
MCV: 75 fL — ABNORMAL LOW (ref 78.0–100.0)
Monocytes Absolute: 0.4 10*3/uL (ref 0.1–1.0)
Monocytes Relative: 3 %
Neutro Abs: 9.4 10*3/uL — ABNORMAL HIGH (ref 1.7–7.7)
Neutrophils Relative %: 79 %
Platelets: 245 10*3/uL (ref 150–400)
RBC: 5.39 MIL/uL — ABNORMAL HIGH (ref 3.87–5.11)
RDW: 18.4 % — ABNORMAL HIGH (ref 11.5–15.5)
WBC: 11.9 10*3/uL — ABNORMAL HIGH (ref 4.0–10.5)

## 2015-10-10 LAB — BASIC METABOLIC PANEL
Anion gap: 9 (ref 5–15)
BUN: 13 mg/dL (ref 6–20)
CO2: 28 mmol/L (ref 22–32)
Calcium: 9.2 mg/dL (ref 8.9–10.3)
Chloride: 104 mmol/L (ref 101–111)
Creatinine, Ser: 0.54 mg/dL (ref 0.44–1.00)
GFR calc Af Amer: >60 mL/min (ref >60)
GFR calc non Af Amer: >60 mL/min (ref >60)
Glucose, Bld: 91 mg/dL (ref 65–99)
Potassium: 3.3 mmol/L — ABNORMAL LOW (ref 3.5–5.1)
Sodium: 141 mmol/L (ref 135–145)

## 2015-10-10 MED ORDER — PREDNISONE 10 MG PO TABS: 40.0000 mg | ORAL_TABLET | Freq: Every day | ORAL | 0 refills | Status: DC

## 2015-10-10 MED ORDER — METHYLPREDNISOLONE SODIUM SUCC 125 MG IJ SOLR
INTRAMUSCULAR | Status: AC
  Filled 2015-10-10: qty 2

## 2015-10-10 MED ORDER — SODIUM CHLORIDE 0.9 % IV SOLN
INTRAVENOUS | Status: DC
  Administered 2015-10-10: 09:00:00 via INTRAVENOUS

## 2015-10-10 MED ORDER — METHYLPREDNISOLONE SODIUM SUCC 125 MG IJ SOLR
125.0000 mg | Freq: Once | INTRAMUSCULAR | Status: AC
  Administered 2015-10-10: 125 mg via INTRAVENOUS

## 2015-10-10 MED ORDER — IPRATROPIUM-ALBUTEROL 0.5-2.5 (3) MG/3ML IN SOLN
RESPIRATORY_TRACT | Status: AC
  Filled 2015-10-10: qty 3

## 2015-10-10 MED ORDER — IPRATROPIUM-ALBUTEROL 0.5-2.5 (3) MG/3ML IN SOLN
3.0000 mL | Freq: Once | RESPIRATORY_TRACT | Status: AC
  Administered 2015-10-10: 3 mL via RESPIRATORY_TRACT

## 2015-10-10 NOTE — ED Triage Notes (Signed)
Pt reports central chest pain and sob since Sunday. Pt reports productive cough, denies fevers or chills. Pt a/ox4.

## 2015-10-10 NOTE — ED Notes (Signed)
MD at bedside. 

## 2015-10-10 NOTE — Telephone Encounter (Signed)
Requesting nebulizer machine and medication for home use.  Patient recently in ED and given neb treatment and responded well to treatment.  [employer] nebulizer form signed by provider.  Please send in nebulizer medicine.  Derl Barrow, RN

## 2015-10-10 NOTE — Discharge Instructions (Signed)
Return for any new or worse symptoms. Start taking the increased dose of prednisone 40 mg once a day for the next 5 days. Follow up with your primary care doctor to see if they want to continue the prednisone at a reduced dose or return to your current dose. Also have him follow-up the potassium. Continue usual albuterol inhaler 2 puffs every 6 hours at least for the next 7 days.

## 2015-10-10 NOTE — ED Provider Notes (Signed)
[facility] DEPT Provider Note   CSN: [id number] Arrival date & time: 10/10/15  [id number]  First Provider Contact:  First MD Initiated Contact with Patient 10/10/15 [phone]        History   Chief Complaint Chief Complaint  Patient presents with  . Chest Pain    HPI Frances Medina is a 55 y.o. female.  The patient with a history of myasthenia gravis. Patient also history of acute respiratory failure. Patient is followed by family practice here at [facility]. Patient is currently taking prednisone 10 mg. Currently using albuterol inhaler. Patient states he been short of breath associated with chest tightness and cough nonproductive since Sunday. Symptoms got worse last night. Patient states she has upper respiratory infection symptoms with congestion.    Patient denies fevers. Patient states she is albuterol inhaler 3 times this morning still felt short of breath. Patient does not feel that this is consistent with worsening of her myasthenia gravis.  Past Medical History:  Diagnosis Date  . Acute respiratory failure ([place])   . Acute respiratory failure with hypoxemia ([place])   . Aspiration into airway   . CAP (community acquired pneumonia) 08/05/2015  . Depression with anxiety   . Dysphagia 03/2015    EGD, Dr Carlean Purl. mild antral gastritis, ? due to Ibuprofen.  no stricture but empirically maloney dilated esophagus.   . E. coli UTI 04/07/2015  . Endotracheally intubated   . Enteritis due to Clostridium difficile   . Fibroid uterus    size of a dime  . GERD (gastroesophageal reflux disease)    hiatal hernia  . Hypertension    "went away when I stopped smoking"  . Knee injury   . Migraine    "maybe couple times/month" (03/20/2015)  . Myasthenia gravis ([place]) 2017  . Myasthenia gravis with acute exacerbation ([place]) 05/15/2015  . Osteoarthritis of left knee 12/02/2013  . Osteoarthritis of right knee 08/30/2013  . Protein-calorie malnutrition, severe ([place]) 08/07/2015  . Seasonal allergies    takes Zytrec  . Tracheostomy status ([place])   . Tumors    "in my stomach"  . Umbilical hernia    watching , no plans for surgery at present  . VAP (ventilator-associated pneumonia) [facility])     Patient Active Problem List   Diagnosis Date Noted  . Headache 09/14/2015  . Numbness and tingling 09/14/2015  . Healthcare maintenance 09/14/2015  . Protein-calorie malnutrition, severe ([place]) 08/07/2015  . Headache, migraine   . Leukocytosis   . HTN (hypertension), benign   . Shortness of breath   . Diplopia   . Dysphagia, neurologic   . Elevated LDH 05/06/2015  . Elevated BP 05/01/2015  . Anemia 04/07/2015  . Neutropenia ([place]) 04/07/2015  . Lymphadenopathy 04/07/2015  . Splenomegaly 04/07/2015  . Myasthenia gravis ([place])   . Osteoarthritis of left knee 12/02/2013  . Benign neoplasm of colon 09/19/2013  . Osteoarthritis of right knee 08/30/2013  . GERD (gastroesophageal reflux disease) 06/23/2013  . Obesity 06/23/2013  . Anxiety and depression 06/23/2013  . Fibroids 06/23/2013    Past Surgical History:  Procedure Laterality Date  . CESAREAN SECTION  1987; 1989  . COLONOSCOPY WITH PROPOFOL N/A 09/19/2013   Procedure: COLONOSCOPY WITH PROPOFOL;  Surgeon: Ladene Artist, MD;  Location: [facility] ENDOSCOPY;  Service: Endoscopy;  Laterality: N/A;  . DILATION AND CURETTAGE OF UTERUS    . ESOPHAGOGASTRODUODENOSCOPY N/A 08/10/2015   Procedure: ESOPHAGOGASTRODUODENOSCOPY (EGD);  Surgeon: Gatha Mayer, MD;  Location: [facility] ENDOSCOPY;  Service: Endoscopy;  Laterality: N/A;  . ESOPHAGOGASTRODUODENOSCOPY (EGD) WITH PROPOFOL N/A 03/21/2015   Procedure: ESOPHAGOGASTRODUODENOSCOPY (EGD) WITH PROPOFOL;  Surgeon: Gatha Mayer, MD;  Location: [place];  Service: Endoscopy;  Laterality: N/A;  . PARTIAL KNEE ARTHROPLASTY Right 08/30/2013   Procedure: RIGHT UNICOMPARTMENTAL KNEE;  Surgeon: Johnny Bridge, MD;  Location: [place];  Service: Orthopedics;  Laterality: Right;  . PARTIAL KNEE ARTHROPLASTY Left  12/02/2013   Procedure: LEFT KNEE UNI ARTHROPLASTY;  Surgeon: Johnny Bridge, MD;  Location: [place];  Service: Orthopedics;  Laterality: Left;  . PEG PLACEMENT N/A 08/10/2015   Procedure: PERCUTANEOUS ENDOSCOPIC GASTROSTOMY (PEG) PLACEMENT;  Surgeon: Gatha Mayer, MD;  Location: [place];  Service: Endoscopy;  Laterality: N/A;  . TUBAL LIGATION  1989  . VAGINAL HYSTERECTOMY  1990's?   "apparently took out one of my ovaries at the time too cause one's missing"    OB History    No data available       Home Medications    Prior to Admission medications   Medication Sig Start Date End Date Taking? Authorizing Provider  albuterol (PROAIR HFA) 108 (90 Base) MCG/ACT inhaler Inhale 2 puffs into the lungs every 4 (four) hours as needed for wheezing or shortness of breath. 08/17/15  Yes Leeanne Rio, MD  clonazePAM (KLONOPIN) 1 MG tablet Take 0.5-1 tablets (0.5-1 mg total) by mouth 2 (two) times daily as needed for anxiety. 06/12/15  Yes [name], MD  estradiol (ESTRACE) 0.5 MG tablet Take 0.5 mg by mouth daily.   Yes Historical Provider, MD  ferrous sulfate 325 (65 FE) MG tablet Take 1 tablet (325 mg total) by mouth 2 (two) times daily with a meal. Patient taking differently: Take 325 mg by mouth daily.  04/07/15  Yes Shawn C Joy, PA-C  ibuprofen (ADVIL,MOTRIN) 200 MG tablet Take 800 mg by mouth as needed for mild pain. Reported on 09/14/2015   Yes Historical Provider, MD  lactose free nutrition (BOOST PLUS) LIQD Take 237 mLs by mouth 3 (three) times daily with meals. 08/11/15  Yes Katheren Shams, DO  nystatin cream (MYCOSTATIN) Apply 1 application topically 2 (two) times daily. 08/14/15  Yes Leeanne Rio, MD  omeprazole (PRILOSEC) 20 MG capsule Take 20 mg by mouth daily. 07/18/15  Yes Historical Provider, MD  predniSONE (DELTASONE) 10 MG tablet Take 10 mg by mouth daily with breakfast. On taper per neuro   Yes Historical Provider, MD  pyridostigmine  (MESTINON) 60 MG tablet Take 1 tablet (60 mg total) by mouth 4 (four) times daily. 06/08/15  Yes Nicolette Bang, DO  sertraline (ZOLOFT) 100 MG tablet Take 50 mg by mouth 2 (two) times daily.    Yes Historical Provider, MD  Vitamin D, Ergocalciferol, (DRISDOL) 50000 units CAPS capsule Take 1 capsule (50,000 Units total) by mouth every 7 (seven) days. 05/02/15  Yes [name], MD  Amino Acids-Protein Hydrolys (FEEDING SUPPLEMENT, PRO-STAT SUGAR FREE 64,) LIQD Place 30 mLs into feeding tube 2 (two) times daily. Patient not taking: Reported on 10/10/2015 08/11/15   Rogue Bussing, MD  Nutritional Supplements (FEEDING SUPPLEMENT, JEVITY 1.5 CAL/FIBER,) LIQD Place 275 mLs into feeding tube 4 (four) times daily. Patient not taking: Reported on 10/10/2015 08/11/15   Rogue Bussing, MD  predniSONE (DELTASONE) 10 MG tablet Take 4 tablets (40 mg total) by mouth daily. 10/10/15   Fredia Sorrow, MD    Family History Family History  Problem Relation Age of Onset  .  Heart disease Mother 65  . Hypertension Mother   . Stroke Father   . Hypertension Father   . Other Sister     Esophageal strictures  . Anemia Sister   . Colon cancer Neg Hx     Social History Social History  Substance Use Topics  . Smoking status: Former Smoker    Packs/day: 0.50    Years: 38.00    Types: Cigarettes    Start date: 03/03/1974    Quit date: 05/14/2012  . Smokeless tobacco: Never Used     Comment: denies thoughts of restarting  . Alcohol use No     Allergies   Ciprofloxacin; Shrimp [shellfish allergy]; Dicyclomine; Methocarbamol; and Penicillins   Review of Systems Review of Systems  Constitutional: Negative for fever.  HENT: Positive for congestion.   Eyes: Negative for redness.  Respiratory: Positive for shortness of breath and wheezing.   Cardiovascular: Positive for chest pain.  Gastrointestinal: Negative for abdominal pain.  Genitourinary: Negative for dysuria.  Musculoskeletal:  Negative for back pain.  Skin: Negative for rash.  Neurological: Negative for headaches.  Hematological: Does not bruise/bleed easily.  Psychiatric/Behavioral: Negative for confusion.     Physical Exam Updated Vital Signs BP 112/62   Pulse 93   Temp 98.3 F (36.8 C) (Oral)   Resp 21   SpO2 93%   Physical Exam  Constitutional: She is oriented to person, place, and time. She appears well-developed and well-nourished.  HENT:  Head: Normocephalic and atraumatic.  Mouth/Throat: Oropharynx is clear and moist.  Eyes: Conjunctivae and EOM are normal. Pupils are equal, round, and reactive to light.  Neck: Normal range of motion.  Cardiovascular: Normal rate and regular rhythm.   Pulmonary/Chest: Effort normal and breath sounds normal.  Abdominal: Soft. Bowel sounds are normal.  Musculoskeletal: Normal range of motion.  Neurological: She is alert and oriented to person, place, and time. No cranial nerve deficit.  Gross motor is intact.  Skin: Skin is warm. No rash noted.  Nursing note and vitals reviewed.    ED Treatments / Results  Labs (all labs ordered are listed, but only abnormal results are displayed) Labs Reviewed  CBC WITH DIFFERENTIAL/PLATELET - Abnormal; Notable for the following:       Result Value   WBC 11.9 (*)    RBC 5.39 (*)    MCV 75.0 (*)    MCH 22.4 (*)    RDW 18.4 (*)    Neutro Abs 9.4 (*)    All other components within normal limits  BASIC METABOLIC PANEL - Abnormal; Notable for the following:    Potassium 3.3 (*)    All other components within normal limits   Results for orders placed or performed during the hospital encounter of 10/10/15  CBC with Differential/Platelet  Result Value Ref Range   WBC 11.9 (H) 4.0 - 10.5 K/uL   RBC 5.39 (H) 3.87 - 5.11 MIL/uL   Hemoglobin 12.1 12.0 - 15.0 g/dL   HCT 40.4 36.0 - 46.0 %   MCV 75.0 (L) 78.0 - 100.0 fL   MCH 22.4 (L) 26.0 - 34.0 pg   MCHC 30.0 30.0 - 36.0 g/dL   RDW 18.4 (H) 11.5 - 15.5 %    Platelets 245 150 - 400 K/uL   Neutrophils Relative % 79 %   Neutro Abs 9.4 (H) 1.7 - 7.7 K/uL   Lymphocytes Relative 18 %   Lymphs Abs 2.1 0.7 - 4.0 K/uL   Monocytes Relative 3 %   Monocytes  Absolute 0.4 0.1 - 1.0 K/uL   Eosinophils Relative 0 %   Eosinophils Absolute 0.0 0.0 - 0.7 K/uL   Basophils Relative 0 %   Basophils Absolute 0.0 0.0 - 0.1 K/uL  Basic metabolic panel  Result Value Ref Range   Sodium 141 135 - 145 mmol/L   Potassium 3.3 (L) 3.5 - 5.1 mmol/L   Chloride 104 101 - 111 mmol/L   CO2 28 22 - 32 mmol/L   Glucose, Bld 91 65 - 99 mg/dL   BUN 13 6 - 20 mg/dL   Creatinine, Ser 0.54 0.44 - 1.00 mg/dL   Calcium 9.2 8.9 - 10.3 mg/dL   GFR calc non Af Amer >60 >60 mL/min   GFR calc Af Amer >60 >60 mL/min   Anion gap 9 5 - 15     EKG  EKG Interpretation  Date/Time:  Wednesday October 10 2015 07:50:09 EDT Ventricular Rate:  99 PR Interval:  172 QRS Duration: 76 QT Interval:  354 QTC Calculation: 454 R Axis:   59 Text Interpretation:  Normal sinus rhythm Low voltage QRS Borderline ECG Confirmed by Zaquan Duffner  MD, Herman Mell ([id number]) on 10/10/2015 7:56:52 AM       Radiology Dg Chest 2 View  Result Date: 10/10/2015 CLINICAL DATA:  Chest tightness and shortness of breath, hypertension, myasthenia gravis, GERD EXAM: CHEST  2 VIEW COMPARISON:  08/05/2015 FINDINGS: Upper normal heart size. Normal mediastinal contours. Pulmonary vascular congestion. Decreased lung volumes with bibasilar atelectasis similar to prior exam. No definite infiltrate, pleural effusion or pneumothorax. IMPRESSION: Chronic low lung volumes and bibasilar atelectasis. Electronically Signed   By: Lavonia Dana M.D.   On: 10/10/2015 08:50    Procedures Procedures (including critical care time)  Medications Ordered in ED Medications  0.9 %  sodium chloride infusion ( Intravenous Stopped 10/10/15 1021)  ipratropium-albuterol (DUONEB) 0.5-2.5 (3) MG/3ML nebulizer solution (  Not Given 10/10/15 0830)    methylPREDNISolone sodium succinate (SOLU-MEDROL) 125 mg/2 mL injection 125 mg (125 mg Intravenous Given 10/10/15 0842)  ipratropium-albuterol (DUONEB) 0.5-2.5 (3) MG/3ML nebulizer solution 3 mL (3 mLs Nebulization Given 10/10/15 0825)     Initial Impression / Assessment and Plan / ED Course  I have reviewed the triage vital signs and the nursing notes.  Pertinent labs & imaging results that were available during my care of the patient were reviewed by me and considered in my medical decision making (see chart for details).  Clinical Course    Patient with history of myasthenia gravis. Also must have some sort history of reactive airway disease. Patient uses albuterol record basis also takes prednisone currently down to 10 mg once a day. Select the prednisone is both for the respiratory and for the myasthenia gravis. Patient states that she feels as if she has not respiratory infection.  Patient treated here with albuterol Atrovent nebulizer as well as 125 mg Solu-Medrol. Symptoms resolved completely patient feels much better. Chest x-ray negative for pneumonia. Patient denies any significant motor weakness.  No real concern for a cardiac event. Symptoms seem to be all pulmonary.  Patient stable for discharge home.  Final Clinical Impressions(s) / ED Diagnoses   Final diagnoses:  SOB (shortness of breath)    New Prescriptions New Prescriptions   PREDNISONE (DELTASONE) 10 MG TABLET    Take 4 tablets (40 mg total) by mouth daily.     Fredia Sorrow, MD 10/10/15 1029

## 2015-10-10 NOTE — ED Notes (Signed)
Patient transported to X-ray 

## 2015-10-10 NOTE — Telephone Encounter (Signed)
Patient presented at the ED for mild respiratory distress. I went over to check on her. Full assessment was not completed. Her symptoms however improved after albuterol neb treatment. Patient will like to have this at home which is appropriate.

## 2015-10-11 ENCOUNTER — Other Ambulatory Visit: Payer: Self-pay | Admitting: Family Medicine

## 2015-10-11 DIAGNOSIS — J984 Other disorders of lung: Secondary | ICD-10-CM | POA: Insufficient documentation

## 2015-10-11 MED ORDER — ALBUTEROL SULFATE (2.5 MG/3ML) 0.083% IN NEBU: 2.5000 mg | INHALATION_SOLUTION | Freq: Four times a day (QID) | RESPIRATORY_TRACT | 2 refills | Status: DC | PRN

## 2015-10-16 ENCOUNTER — Other Ambulatory Visit: Payer: Self-pay | Admitting: Family Medicine

## 2015-10-16 DIAGNOSIS — E161 Other hypoglycemia: Secondary | ICD-10-CM

## 2015-10-16 NOTE — Progress Notes (Signed)
I ordered referral to sleep study. Please help schedule.

## 2015-10-16 NOTE — Progress Notes (Signed)
Ok, I see her appointment. I will cancel new referral.

## 2015-10-16 NOTE — Progress Notes (Signed)
What does she need help with?  She is scheduled for 11-19-15. Jazmin Hartsell,CMA

## 2015-10-19 ENCOUNTER — Other Ambulatory Visit (HOSPITAL_COMMUNITY)
Admission: RE | Admit: 2015-10-19 | Discharge: 2015-10-19 | Disposition: A | Discharge status: Home / Self Care | Payer: BC Managed Care – PPO | Source: Ambulatory Visit | Attending: Family Medicine | Admitting: Family Medicine

## 2015-10-19 ENCOUNTER — Encounter: Payer: Self-pay | Admitting: Family Medicine

## 2015-10-19 ENCOUNTER — Ambulatory Visit (INDEPENDENT_AMBULATORY_CARE_PROVIDER_SITE_OTHER): Payer: BC Managed Care – PPO | Admitting: Family Medicine

## 2015-10-19 ENCOUNTER — Other Ambulatory Visit: Payer: Self-pay | Admitting: Family Medicine

## 2015-10-19 VITALS — BP 122/79 | HR 99 | Temp 97.9°F | Ht 61.0 in | Wt 259.0 lb

## 2015-10-19 DIAGNOSIS — Z01419 Encounter for gynecological examination (general) (routine) without abnormal findings: Secondary | ICD-10-CM

## 2015-10-19 DIAGNOSIS — Z124 Encounter for screening for malignant neoplasm of cervix: Secondary | ICD-10-CM

## 2015-10-19 DIAGNOSIS — G7000 Myasthenia gravis without (acute) exacerbation: Secondary | ICD-10-CM

## 2015-10-19 DIAGNOSIS — R32 Unspecified urinary incontinence: Secondary | ICD-10-CM

## 2015-10-19 DIAGNOSIS — R030 Elevated blood-pressure reading, without diagnosis of hypertension: Secondary | ICD-10-CM | POA: Diagnosis not present

## 2015-10-19 DIAGNOSIS — Z13220 Encounter for screening for lipoid disorders: Secondary | ICD-10-CM

## 2015-10-19 DIAGNOSIS — IMO0001 Reserved for inherently not codable concepts without codable children: Secondary | ICD-10-CM

## 2015-10-19 DIAGNOSIS — N3941 Urge incontinence: Secondary | ICD-10-CM

## 2015-10-19 DIAGNOSIS — R161 Splenomegaly, not elsewhere classified: Secondary | ICD-10-CM

## 2015-10-19 DIAGNOSIS — Z1151 Encounter for screening for human papillomavirus (HPV): Secondary | ICD-10-CM | POA: Insufficient documentation

## 2015-10-19 DIAGNOSIS — R0602 Shortness of breath: Secondary | ICD-10-CM

## 2015-10-19 HISTORY — DX: Unspecified urinary incontinence: R32

## 2015-10-19 HISTORY — DX: Encounter for gynecological examination (general) (routine) without abnormal findings: Z01.419

## 2015-10-19 LAB — LIPID PANEL
Cholesterol: 164 mg/dL (ref 125–200)
HDL: 68 mg/dL (ref >=46)
LDL Cholesterol: 69 mg/dL (ref <130)
Total CHOL/HDL Ratio: 2.4 Ratio (ref <=5.0)
Triglycerides: 136 mg/dL (ref <150)
VLDL: 27 mg/dL (ref <30)

## 2015-10-19 NOTE — Assessment & Plan Note (Signed)
I worry her LUQ tenderness is due to her splenomegaly although she attributed it to fibroid. Recent CTA suggested worsening splenomegaly. Patient strongly advised to schedule follow up with hematology-oncology as previously instructed. Patient agreed with plan. I will also message my team to help schedule appointment if possible.

## 2015-10-19 NOTE — Assessment & Plan Note (Signed)
Gyn exam benign. PAP co-testing done. She will prefer 5 yearly PAP test if result is normal. I will contact her with result.

## 2015-10-19 NOTE — Assessment & Plan Note (Signed)
Patient requesting medication but unfortunately anticholinesterase is contraindicated in MG. Kegel exercise instruction given/ Continue use of depends pad. Consider urology referral if worsening despite conservative measures.

## 2015-10-19 NOTE — Progress Notes (Signed)
   10/19/15 0836  PHQ-9 Depression Scale - How often have you been bothered by each of the following symptoms during the past two weeks?  Little interest or pleasure in doing things 0  Feeling down, depressed, or hopeless 0  Trouble falling or staying asleep, or sleeping too much 0  Feeling tired or having little energy 0  Poor appetite or overeating 0  Feeling bad about yourself - or that you are a failure or have let yourself or your family down 0  Trouble concentrating on things, such as reading the newspaper or watching television 0  Moving or speaking so slowly that other people could have noticed. Or the opposite - being so fidgety or restless that you have been moving around a lot more than usual 0  Thoughts that you would be better off dead, or of hurting yourself in some way 0  Score 0

## 2015-10-19 NOTE — Assessment & Plan Note (Signed)
BP looks good today. We will monitor for now.

## 2015-10-19 NOTE — Progress Notes (Signed)
Subjective:     Patient ID: Frances Medina, female   DOB: 05/22/1960, 55 y.o.   MRN: [id number]  HPI Gyn exam: Here for PAP, denies GU symptoms. She had vaginal hysterectomy in 1990s, she stated she had partial hysterectomy. Here to be assessed. SOB: Here for follow up after ED visit for SOB for which she was treated with albuterol and solumedrol. Currently she continues with home albuterol as needed. She also mentioned that during her recent ED visit, her prednisone dose was increased from 10 mg to 20 mg. She felt her symptoms improved a lot on that. Elevated BP: Here for follow up. No neurologic concern. Myasthenia Gravis: She is compliant with medication and neurology follow up. Currently on  Urine incontinence: C/O losing her urine in the last few months, now worsening. She uses depends but she leak through it. Denies any other GU symptoms. Splenomegaly: Still yet to schedule follow up with oncology.  Review of Systems  Constitutional: Negative for fever.  Respiratory: Positive for chest tightness and shortness of breath.        Throat clearing  Cardiovascular: Negative.   Gastrointestinal: Negative.   Genitourinary: Positive for frequency and urgency. Negative for decreased urine volume, dysuria, enuresis, hematuria, vaginal bleeding, vaginal discharge and vaginal pain.       Feels like she wet her under pant before reaching the bathroom.  All other systems reviewed and are negative.      Objective:   Physical Exam  Constitutional: She is oriented to person, place, and time. She appears well-developed. No distress.  Cardiovascular: Normal rate, regular rhythm, normal heart sounds and intact distal pulses.   No murmur heard. Pulmonary/Chest: Effort normal and breath sounds normal. No respiratory distress. She has no wheezes.  Abdominal: Soft. Bowel sounds are normal. She exhibits no mass. There is no tenderness. There is no rebound and no guarding.  Obese. Mild left upper quadrant  tenderness.   Genitourinary: Vagina normal. Cervix exhibits no motion tenderness and no discharge.    Musculoskeletal: She exhibits no edema.  Neurological: She is alert and oriented to person, place, and time. No cranial nerve deficit.  Nursing note and vitals reviewed.      Assessment:     Gyn exam SOB Elevated BP MG\Urine incontinence Splenomegaly.    Plan:     Check problem list.

## 2015-10-19 NOTE — Assessment & Plan Note (Signed)
Improved from last time she visited the ED. Xray done at the ED was normal with no acute pathology. Query related to her MG vs restrictive lung disease. She will continue albuterol prn and Prednisone as instructed. F/U for her schedule sleep study appointment. Return precaution discussed.

## 2015-10-19 NOTE — Assessment & Plan Note (Signed)
Seems stable on Mestinon and prednisone. F/U with Neursurg as instructed.

## 2015-10-21 ENCOUNTER — Telehealth: Payer: Self-pay | Admitting: Family Medicine

## 2015-10-21 NOTE — Telephone Encounter (Signed)
Left to call back.   Please let her know her cholesterol test is normal when she calls backk.

## 2015-10-22 LAB — CYTOLOGY - PAP

## 2015-10-23 ENCOUNTER — Telehealth: Payer: Self-pay | Admitting: Internal Medicine

## 2015-10-23 ENCOUNTER — Ambulatory Visit: Payer: BC Managed Care – PPO | Admitting: Family Medicine

## 2015-10-23 NOTE — Telephone Encounter (Signed)
Would like her seen by APP or me in 1-2 days I could see her at 0815 tomorrow or 415 I suppose

## 2015-10-23 NOTE — Telephone Encounter (Signed)
Patient reports that her PEG site has been bleeding for about a week or 2.  She reports that there is purulent bloody drainage, with redness/rash at the site.  She reports she has been putting neosporin around the site.  Please advise

## 2015-10-23 NOTE — Telephone Encounter (Signed)
No APP appts.  Patient will come in tomorrow atn 8:15 with Dr. Carlean Purl

## 2015-10-23 NOTE — Telephone Encounter (Signed)
I discussed her FLP and PAP result with her. PAP result is normal. However transitional zone was absent, this is due to postmenopausal status/cervical atrophy. Although co-testing was done, she is advised repeat PAP in 3 yrs instead of 5 yrs. F/U as needed.  She mentioned irritation around her Peg tube with bleeding. This was placed by Dr. Carlean Purl. I advised her to call his office for follow up appointment today and if this is worsening to go to the hospital. She verbalized understanding.

## 2015-10-24 ENCOUNTER — Telehealth: Payer: Self-pay | Admitting: Hematology

## 2015-10-24 ENCOUNTER — Other Ambulatory Visit: Payer: Self-pay | Admitting: Family Medicine

## 2015-10-24 ENCOUNTER — Other Ambulatory Visit: Payer: BC Managed Care – PPO

## 2015-10-24 ENCOUNTER — Encounter: Payer: Self-pay | Admitting: Internal Medicine

## 2015-10-24 ENCOUNTER — Ambulatory Visit (INDEPENDENT_AMBULATORY_CARE_PROVIDER_SITE_OTHER): Payer: BC Managed Care – PPO | Admitting: Internal Medicine

## 2015-10-24 VITALS — BP 130/80 | HR 80 | Ht 60.0 in | Wt 260.0 lb

## 2015-10-24 DIAGNOSIS — K9422 Gastrostomy infection: Secondary | ICD-10-CM

## 2015-10-24 DIAGNOSIS — E559 Vitamin D deficiency, unspecified: Secondary | ICD-10-CM

## 2015-10-24 MED ORDER — NYSTATIN POWD: 0 refills | Status: DC

## 2015-10-24 MED ORDER — DOXYCYCLINE HYCLATE 100 MG PO CAPS: 100.0000 mg | ORAL_CAPSULE | Freq: Two times a day (BID) | ORAL | 0 refills | Status: DC

## 2015-10-24 NOTE — Progress Notes (Signed)
Please help patient schedule lab appointment for Vit D check.

## 2015-10-24 NOTE — Patient Instructions (Addendum)
  We have sent the following medications to your pharmacy for you to pick up at your convenience: Doxycyline    Also purchase an over the counter anti-fungal powder.  Come back to see [place] 10/31/15 at 9:30AM   Call us back if you develop more redness before your appointment.   I appreciate the opportunity to care for you. Silvano Rusk, MD, [facility]

## 2015-10-24 NOTE — Telephone Encounter (Signed)
Please advise patient to come in for Vit D lab recheck. In the mean time start OTC vitamin D supplement and Calcium.

## 2015-10-24 NOTE — Telephone Encounter (Signed)
Lft vm for pt to callback to schedule.

## 2015-10-24 NOTE — Progress Notes (Signed)
Spoke with patient and she is coming in this afternoon for lab work. Frances Medina,CMA

## 2015-10-24 NOTE — Progress Notes (Signed)
Patient ID: Frances Medina, female   DOB: 13-Mar-1960, 55 y.o.   MRN: [id number] Lab Tech was able to add Vitamin D lab to the previous blood drawn. I called patient to inform her that we are counseling her lab appointment. She verbalized understanding.

## 2015-10-25 ENCOUNTER — Telehealth: Payer: Self-pay | Admitting: Internal Medicine

## 2015-10-25 ENCOUNTER — Telehealth: Payer: Self-pay | Admitting: Family Medicine

## 2015-10-25 ENCOUNTER — Telehealth: Payer: Self-pay | Admitting: Hematology

## 2015-10-25 LAB — VITAMIN D 25 HYDROXY (VIT D DEFICIENCY, FRACTURES): Vit D, 25-Hydroxy: 26 ng/mL — ABNORMAL LOW (ref 30–100)

## 2015-10-25 MED ORDER — VITAMIN D (ERGOCALCIFEROL) 1.25 MG (50000 UNIT) PO CAPS: 50000.0000 [IU] | ORAL_CAPSULE | ORAL | 0 refills | Status: DC

## 2015-10-25 NOTE — Telephone Encounter (Signed)
Vitamin D result discussed with her. Still low but better than before. Will give four more weeks of Vit D 50,000. She is advised to start [place] after completing 4 wks of Vit D 50,000. She verbalized understanding.

## 2015-10-25 NOTE — Telephone Encounter (Signed)
Spoke with pt and questions were answered. 

## 2015-10-25 NOTE — Telephone Encounter (Signed)
Patient cld to schedule an appt w/[facility]. Last seen [place] on 3/2. Made an appt for 8/29 at 930am. Patient agreed. Demographics re-verified.

## 2015-10-28 ENCOUNTER — Encounter: Payer: Self-pay | Admitting: Internal Medicine

## 2015-10-28 NOTE — Progress Notes (Signed)
   Subjective:    Patient ID: Frances Medina, female    DOB: [birth year], 55 y.o.   MRN: [id number] Cc: bleeding and irritation at PEG site HPI Here with several days of redness and mild pain at PEG site - placed in June 2017. Functioning ok. Some yellowish DC also. No fever Medications, allergies, past medical history, past surgical history, family history and social history are reviewed and updated in the EMR.   Review of Systems As above    Objective:   Physical Exam BP 130/80 (BP Location: Right Arm, Patient Position: Sitting, Cuff Size: Normal)   Pulse 80   Ht 5' (1.524 m)   Wt 260 lb (117.9 kg)   BMI 50.78 kg/m  Abd obese, gastrostomy in LUQ w/ small area of erythema and skin is edematous with slight breakdown. Tan-yellow fluid material expressed when pressing, slightly tender w/ no fluctuance but ? Slight sq thickening. Area borders marked with Sharpie - photo taken for chart.       Assessment & Plan:   Encounter Diagnosis  Name Primary?  . 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 Rx doxycycline 100 mg bid x 10d and nystatin powder to site. RTC next week - sooner if not improving.

## 2015-10-30 ENCOUNTER — Ambulatory Visit (HOSPITAL_BASED_OUTPATIENT_CLINIC_OR_DEPARTMENT_OTHER): Payer: BC Managed Care – PPO

## 2015-10-30 ENCOUNTER — Ambulatory Visit (HOSPITAL_BASED_OUTPATIENT_CLINIC_OR_DEPARTMENT_OTHER): Payer: BC Managed Care – PPO | Admitting: Hematology

## 2015-10-30 ENCOUNTER — Encounter: Payer: Self-pay | Admitting: Hematology

## 2015-10-30 ENCOUNTER — Telehealth: Payer: Self-pay | Admitting: Hematology

## 2015-10-30 VITALS — BP 159/89 | HR 92 | Temp 98.2°F | Resp 17 | Ht 60.0 in | Wt 261.2 lb

## 2015-10-30 DIAGNOSIS — R591 Generalized enlarged lymph nodes: Secondary | ICD-10-CM

## 2015-10-30 DIAGNOSIS — G7000 Myasthenia gravis without (acute) exacerbation: Secondary | ICD-10-CM | POA: Diagnosis not present

## 2015-10-30 DIAGNOSIS — R590 Localized enlarged lymph nodes: Secondary | ICD-10-CM | POA: Diagnosis not present

## 2015-10-30 DIAGNOSIS — R161 Splenomegaly, not elsewhere classified: Secondary | ICD-10-CM | POA: Diagnosis not present

## 2015-10-30 DIAGNOSIS — R7402 Elevation of levels of lactic acid dehydrogenase (LDH): Secondary | ICD-10-CM

## 2015-10-30 DIAGNOSIS — R740 Nonspecific elevation of levels of transaminase and lactic acid dehydrogenase [LDH]: Secondary | ICD-10-CM

## 2015-10-30 LAB — CBC & DIFF AND RETIC
BASO%: 0.1 % (ref 0.0–2.0)
Basophils Absolute: 0 10*3/uL (ref 0.0–0.1)
EOS%: 0 % (ref 0.0–7.0)
Eosinophils Absolute: 0 10*3/uL (ref 0.0–0.5)
HCT: 38.9 % (ref 34.8–46.6)
HGB: 12.4 g/dL (ref 11.6–15.9)
Immature Retic Fract: 5.1 % (ref 1.60–10.00)
LYMPH%: 5.2 % — ABNORMAL LOW (ref 14.0–49.7)
MCH: 23 pg — ABNORMAL LOW (ref 25.1–34.0)
MCHC: 31.9 g/dL (ref 31.5–36.0)
MCV: 72.2 fL — ABNORMAL LOW (ref 79.5–101.0)
MONO#: 0.4 10*3/uL (ref 0.1–0.9)
MONO%: 1.8 % (ref 0.0–14.0)
NEUT#: 18.3 10*3/uL — ABNORMAL HIGH (ref 1.5–6.5)
NEUT%: 92.9 % — ABNORMAL HIGH (ref 38.4–76.8)
Platelets: 251 10*3/uL (ref 145–400)
RBC: 5.39 10*6/uL (ref 3.70–5.45)
RDW: 19 % — ABNORMAL HIGH (ref 11.2–14.5)
Retic %: 1.38 % (ref 0.70–2.10)
Retic Ct Abs: 74.38 10*3/uL (ref 33.70–90.70)
WBC: 19.7 10*3/uL — ABNORMAL HIGH (ref 3.9–10.3)
lymph#: 1 10*3/uL (ref 0.9–3.3)

## 2015-10-30 LAB — COMPREHENSIVE METABOLIC PANEL
ALT: 16 U/L (ref 0–55)
AST: 13 U/L (ref 5–34)
Albumin: 3.4 g/dL — ABNORMAL LOW (ref 3.5–5.0)
Alkaline Phosphatase: 80 U/L (ref 40–150)
Anion Gap: 10 mEq/L (ref 3–11)
BUN: 21.6 mg/dL (ref 7.0–26.0)
CO2: 26 mEq/L (ref 22–29)
Calcium: 9.9 mg/dL (ref 8.4–10.4)
Chloride: 105 mEq/L (ref 98–109)
Creatinine: 0.7 mg/dL (ref 0.6–1.1)
EGFR: >90 mL/min/{1.73_m2} (ref >90)
Glucose: 94 mg/dl (ref 70–140)
Potassium: 4.2 mEq/L (ref 3.5–5.1)
Sodium: 141 mEq/L (ref 136–145)
Total Bilirubin: 0.4 mg/dL (ref 0.20–1.20)
Total Protein: 7.1 g/dL (ref 6.4–8.3)

## 2015-10-30 LAB — LACTATE DEHYDROGENASE: LDH: 277 U/L — ABNORMAL HIGH (ref 125–245)

## 2015-10-30 NOTE — Telephone Encounter (Signed)
Patient sent back to lab and given avs report and appointments for September. Central radiology will call re scan -patient aware.

## 2015-10-31 ENCOUNTER — Other Ambulatory Visit: Payer: Self-pay | Admitting: *Deleted

## 2015-10-31 ENCOUNTER — Encounter: Payer: Self-pay | Admitting: Internal Medicine

## 2015-10-31 ENCOUNTER — Ambulatory Visit (INDEPENDENT_AMBULATORY_CARE_PROVIDER_SITE_OTHER): Payer: BC Managed Care – PPO | Admitting: Internal Medicine

## 2015-10-31 VITALS — BP 120/80 | HR 94 | Ht 60.0 in | Wt 261.0 lb

## 2015-10-31 DIAGNOSIS — K9422 Gastrostomy infection: Secondary | ICD-10-CM | POA: Diagnosis not present

## 2015-10-31 MED ORDER — CLONAZEPAM 1 MG PO TABS: 0.5000 mg | ORAL_TABLET | Freq: Two times a day (BID) | ORAL | 2 refills | Status: DC | PRN

## 2015-10-31 NOTE — Telephone Encounter (Signed)
I called in her prescription to pharmacy today.

## 2015-10-31 NOTE — Progress Notes (Signed)
   Subjective:    Patient ID: Frances Medina, female    DOB: 28-Sep-1960, 55 y.o.   MRN: [id number] Cc: f/u gastrsotomy site infection HPI  Improved - has noticed a tab from gastrostomy bumper created a defect in skin so keeps it off the skin. On doxy and applying Neosporin and nystatin powder  Review of Systems As above    Objective:   Physical Exam BP 120/80 (BP Location: Right Arm, Patient Position: Sitting, Cuff Size: Normal)   Pulse 94   Ht 5' (1.524 m)   Wt 261 lb (118.4 kg)   BMI 50.97 kg/m   Mild erythema around inferior aspect of gastrostomy site - see photo. There is a small defect w/ serosanguinous fluid in it but no dc.       Assessment & Plan:   Encounter Diagnosis  Name Primary?  . 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 Return 1-2 weeks to check.

## 2015-10-31 NOTE — Patient Instructions (Addendum)
  Continue to use your neosporin as your doing now.     We will see you again 11/08/15 at 2:15pm.  Call us if it gets worst between now and then.      I appreciate the opportunity to care for you. Silvano Rusk, MD, [facility]

## 2015-11-07 ENCOUNTER — Telehealth: Payer: Self-pay | Admitting: Family Medicine

## 2015-11-07 ENCOUNTER — Ambulatory Visit (INDEPENDENT_AMBULATORY_CARE_PROVIDER_SITE_OTHER): Payer: BC Managed Care – PPO | Admitting: *Deleted

## 2015-11-07 VITALS — BP 130/82 | HR 82 | Temp 98.0°F | Wt 264.4 lb

## 2015-11-07 DIAGNOSIS — R252 Cramp and spasm: Secondary | ICD-10-CM

## 2015-11-07 LAB — COMPLETE METABOLIC PANEL WITH GFR
ALT: 14 U/L (ref 6–29)
AST: 15 U/L (ref 10–35)
Albumin: 3.7 g/dL (ref 3.6–5.1)
Alkaline Phosphatase: 66 U/L (ref 33–130)
BUN: 17 mg/dL (ref 7–25)
CO2: 28 mmol/L (ref 20–31)
Calcium: 9 mg/dL (ref 8.6–10.4)
Chloride: 101 mmol/L (ref 98–110)
Creat: 0.72 mg/dL (ref 0.50–1.05)
GFR, Est African American: >89 mL/min (ref >=60)
GFR, Est Non African American: >89 mL/min (ref >=60)
Glucose, Bld: 73 mg/dL (ref 65–99)
Potassium: 4.1 mmol/L (ref 3.5–5.3)
Sodium: 136 mmol/L (ref 135–146)
Total Bilirubin: 0.4 mg/dL (ref 0.2–1.2)
Total Protein: 6.2 g/dL (ref 6.1–8.1)

## 2015-11-07 LAB — POCT URINALYSIS DIPSTICK
Bilirubin, UA: NEGATIVE
Blood, UA: NEGATIVE
Glucose, UA: NEGATIVE
Leukocytes, UA: NEGATIVE
Nitrite, UA: NEGATIVE
Spec Grav, UA: 1.02
Urobilinogen, UA: 0.2
pH, UA: 7

## 2015-11-07 LAB — TSH: TSH: 2.19 mIU/L

## 2015-11-07 LAB — POCT GLYCOSYLATED HEMOGLOBIN (HGB A1C): Hemoglobin A1C: 6

## 2015-11-07 NOTE — Progress Notes (Signed)
   Patient in nurse clinic for bilateral leg cramps (calves). Cramping occurs only when legs are elevated.  Denies when ambulation or legs.  Pain in calves with touching.  Dr. Gwendlyn Deutscher ordered lab work.  Patient to follow up with neurologist. Pt has appt on 11/22/15 with neurologist.  Will forward to PCP.  Derl Barrow, RN

## 2015-11-07 NOTE — Progress Notes (Signed)
Patient seen by me, no leg swelling, no erythema. I discussed the potential causes of her cramping. Lab done, I will call her soon with result. In the mean time keep well hydrated.

## 2015-11-07 NOTE — Telephone Encounter (Signed)
Patient called stating she started having B/L leg cramps which started few hours earlier. She denies swelling, no trauma to her legs.Cramp is mostly on her calf. Denies previous episode.  I advised hydration and ED visits if worsening, otherwise to schedule nurse visit the following day for assessment and lab work to R/O myositis. She agreed with plan. I will place future lab for her.  Note: She is on Mestinon for MG which can cause muscle cramps. If test are normal she likely has medication induced cramping.

## 2015-11-08 ENCOUNTER — Encounter: Payer: Self-pay | Admitting: Internal Medicine

## 2015-11-08 ENCOUNTER — Ambulatory Visit (INDEPENDENT_AMBULATORY_CARE_PROVIDER_SITE_OTHER): Payer: BC Managed Care – PPO | Admitting: Internal Medicine

## 2015-11-08 VITALS — BP 112/60 | HR 104 | Ht 60.0 in | Wt 265.0 lb

## 2015-11-08 DIAGNOSIS — K9422 Gastrostomy infection: Secondary | ICD-10-CM | POA: Diagnosis not present

## 2015-11-08 DIAGNOSIS — K224 Dyskinesia of esophagus: Secondary | ICD-10-CM | POA: Diagnosis not present

## 2015-11-08 HISTORY — DX: Gastrostomy infection: K94.22

## 2015-11-08 HISTORY — DX: Dyskinesia of esophagus: K22.4

## 2015-11-08 LAB — MYOGLOBIN, SERUM: Myoglobin: <30 mcg/L (ref <=66)

## 2015-11-08 NOTE — Patient Instructions (Signed)
  Continue using your bandage and cover the small hole area with a non-stick pad.    Continue using neosporin.     Follow up with Dr Carlean Purl as needed.       I appreciate the opportunity to care for you. Silvano Rusk, MD, [facility]

## 2015-11-08 NOTE — Progress Notes (Signed)
   Subjective:    Patient ID: Frances Medina, female    DOB: March 24, 1960, 55 y.o.   MRN: [id number] Chief Complaint: Follow-up of gastrostomy infection HPI The patient returns, the erythema around her wound is gone there is no pain there is still a little skin defect she reports. She is trying to keep the bumper off this area. Gastrostomy is functioning well she says. She is able to eat small amounts orally not much. Medications, allergies, past medical history, past surgical history, family history and social history are reviewed and updated in the EMR.  Review of Systems As above    Objective:   Physical Exam BP 112/60 (BP Location: Left Arm, Patient Position: Sitting, Cuff Size: Normal)   Pulse (!) 104   Ht 5' (1.524 m) Comment: height measured without shoes  Wt 265 lb (120.2 kg)   BMI 51.75 kg/m  Gastrostomies in left upper quadrant there is a small skin defect/ulcer. There is slight surrounding erythema immediately but no cellulitis or infection or tenderness or purulent discharge. Photograph taken and added to images      Assessment & Plan:   1. 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 This little skin ulcer I think is left over from when the tissues were moist in the bumper cause this erosion. It is improved. She will use a small nonstick pad on this area and try to keep the bumper off that. She will also continue to use Neosporin and she will see me as needed.

## 2015-11-09 ENCOUNTER — Telehealth: Payer: Self-pay | Admitting: Family Medicine

## 2015-11-09 ENCOUNTER — Telehealth: Payer: Self-pay

## 2015-11-09 LAB — CK ISOENZYMES
CK-BB: 0 %
CK-MB: 0 % (ref <5)
CK-MM: 100 % (ref 95–100)
Creatine Kinase, Total: 60 U/L (ref 29–143)

## 2015-11-09 NOTE — Telephone Encounter (Signed)
Lab result discussed with her, all looks normal. CK still pending. As discussed with her, her leg cramping is likely due to Mestinon. I advised she discussed this with her neurologist. She agreed with plan.

## 2015-11-12 ENCOUNTER — Ambulatory Visit: Payer: BC Managed Care – PPO | Admitting: Hematology

## 2015-11-12 ENCOUNTER — Other Ambulatory Visit: Payer: BC Managed Care – PPO

## 2015-11-13 ENCOUNTER — Ambulatory Visit: Payer: BC Managed Care – PPO | Admitting: Hematology

## 2015-11-16 ENCOUNTER — Telehealth: Payer: Self-pay | Admitting: *Deleted

## 2015-11-16 NOTE — Telephone Encounter (Signed)
Per pt request, this RN asked Dr. Irene Limbo if it would be appropriate for pt to have rx for ativan prior to CT scan on 9/22.  Per Dr. Irene Limbo, lvm with patient stating she could take the klonopin prescribed by her pcp 1hr prior to scan.  Pt scheduled to see Dr. Irene Limbo prior to scan 9/22.

## 2015-11-19 ENCOUNTER — Ambulatory Visit (HOSPITAL_BASED_OUTPATIENT_CLINIC_OR_DEPARTMENT_OTHER): Payer: BC Managed Care – PPO | Attending: Family Medicine | Admitting: Internal Medicine

## 2015-11-19 VITALS — Ht 60.0 in | Wt 264.0 lb

## 2015-11-19 DIAGNOSIS — G4733 Obstructive sleep apnea (adult) (pediatric): Secondary | ICD-10-CM | POA: Insufficient documentation

## 2015-11-19 DIAGNOSIS — G4730 Sleep apnea, unspecified: Secondary | ICD-10-CM | POA: Insufficient documentation

## 2015-11-19 DIAGNOSIS — R0902 Hypoxemia: Secondary | ICD-10-CM | POA: Insufficient documentation

## 2015-11-19 DIAGNOSIS — I10 Essential (primary) hypertension: Secondary | ICD-10-CM | POA: Insufficient documentation

## 2015-11-19 DIAGNOSIS — E669 Obesity, unspecified: Secondary | ICD-10-CM | POA: Diagnosis not present

## 2015-11-19 DIAGNOSIS — G4734 Idiopathic sleep related nonobstructive alveolar hypoventilation: Secondary | ICD-10-CM

## 2015-11-19 NOTE — Progress Notes (Signed)
[name]    HEMATOLOGY/ONCOLOGY CLINIC NOTE  Date of Service: .10/30/2015  Patient Care Team: [name], MD as PCP - General (Family Medicine)  CHIEF COMPLAINTS/PURPOSE OF CONSULTATION:   Lymphadenopathy, recent diagnosis of Myasthenia gravis  HISTORY OF PRESENTING ILLNESS:   Frances Medina is a wonderful 55 y.o. female who has been referred to [place] by Dr Lala Lund  for evaluation and management of Lymphadenopathy.  Patient has a history of hypertension,uterine fibroids, anxiety, migraines and recently diagnosed myasthenia gravis who was admitted to the hospital recently on 04/07/2015 with several day history of worsening shortness of breath.  She had a CTA of the chest that showed no pulmonary embolism but did show lymphadenopathy of her bilateral axillary, left subclavian and cervical lymph nodes and splenomegaly as well.  She was noted to have dysphagia related to her myasthenia and was noted to have a myasthenic crisis triggered by an Escherichia coli urinary tract infection.  She was seen by neurology and treated with high-dose steroids along with IVIG for 3 doses.  She was sent home on a prednisone taper and Mestinon with home physical therapy and home RN evaluation and management.  Her left eye ptosis and her dysphagia improved significantly with treatment.   She has completed her prednisone about 3 weeks ago and currently is only on Mestinon.  She has outpatient neurology followup.  She is here for further evaluation of her splenomegaly and lymphadenopathy. She notes no unexpected weight loss.  No fevers or chills.  No night sweats. Notes some abdominal fullness but no significant pain. Notes that her dysphagia is much improved on the Mestinon.  Has been having some generalized weakness. She notes no specific focal symptoms at this time.    Patient subsequently had a PET/CT on3/13/2017 which showed no hypermetabolic masses or lymphadenopathy though patient was on steroids..  No evidence of thymoma.   INTERVAL HISTORY Patient has been sent to a [place] for a followup consultation by her PCP. She notes that she has continued to have issues with dysphagia that has required placement of a feeding tube to reduce aspiration risk. She notes that she has been given a referral to [facility] neurology for further management of her Myasthenia gravis. Patient notes no fevers/chills/night sweats/unexpected weight loss. No new LNadenopathy noted. Patient still continues to be on prednisone for her MG   MEDICAL HISTORY:  Past Medical History:  Diagnosis Date  . Acute respiratory failure ([place])   . Acute respiratory failure with hypoxemia ([place])   . Aspiration into airway   . CAP (community acquired pneumonia) 08/05/2015  . Depression with anxiety   . Dysphagia 03/2015    EGD, Dr Carlean Purl. mild antral gastritis, ? due to Ibuprofen.  no stricture but empirically maloney dilated esophagus.   . E. coli UTI 04/07/2015  . Endotracheally intubated   . Enteritis due to Clostridium difficile   . Fibroid uterus    size of a dime  . 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 GERD (gastroesophageal reflux disease)    hiatal hernia  . Hypertension    "went away when I stopped smoking"  . Knee injury   . Migraine    "maybe couple times/month" (03/20/2015)  . Myasthenia gravis ([place]) 2017  . Myasthenia gravis with acute exacerbation ([place]) 05/15/2015  . Osteoarthritis of left knee 12/02/2013  . Osteoarthritis of right knee 08/30/2013  . Protein-calorie malnutrition, severe ([place]) 08/07/2015  . Seasonal allergies    takes Zytrec  . Tracheostomy status ([place])   .  Tumors    "in my stomach"  . Umbilical hernia    watching , no plans for surgery at present  . VAP (ventilator-associated pneumonia) ([place])     SURGICAL HISTORY: Past Surgical History:  Procedure Laterality Date  . CESAREAN SECTION  1987; 1989  . COLONOSCOPY WITH PROPOFOL N/A 09/19/2013   Procedure: COLONOSCOPY WITH PROPOFOL;  Surgeon: Ladene Artist, MD;  Location: [facility] ENDOSCOPY;  Service: Endoscopy;  Laterality: N/A;  . DILATION AND CURETTAGE OF UTERUS    . ESOPHAGOGASTRODUODENOSCOPY N/A 08/10/2015   Procedure: ESOPHAGOGASTRODUODENOSCOPY (EGD);  Surgeon: Gatha Mayer, MD;  Location: [facility] ENDOSCOPY;  Service: Endoscopy;  Laterality: N/A;  . ESOPHAGOGASTRODUODENOSCOPY (EGD) WITH PROPOFOL N/A 03/21/2015   Procedure: ESOPHAGOGASTRODUODENOSCOPY (EGD) WITH PROPOFOL;  Surgeon: Gatha Mayer, MD;  Location: [place];  Service: Endoscopy;  Laterality: N/A;  . PARTIAL KNEE ARTHROPLASTY Right 08/30/2013   Procedure: RIGHT UNICOMPARTMENTAL KNEE;  Surgeon: Johnny Bridge, MD;  Location: [place];  Service: Orthopedics;  Laterality: Right;  . PARTIAL KNEE ARTHROPLASTY Left 12/02/2013   Procedure: LEFT KNEE UNI ARTHROPLASTY;  Surgeon: Johnny Bridge, MD;  Location: [place];  Service: Orthopedics;  Laterality: Left;  . PEG PLACEMENT N/A 08/10/2015   Procedure: PERCUTANEOUS ENDOSCOPIC GASTROSTOMY (PEG) PLACEMENT;  Surgeon: Gatha Mayer, MD;  Location: [place];  Service: Endoscopy;  Laterality: N/A;  . TUBAL LIGATION  1989  . VAGINAL HYSTERECTOMY  1990's?   "apparently took out one of my ovaries at the time too cause one's missing"    SOCIAL HISTORY: Social History   Social History  . Marital status: Married    Spouse name: N/A  . Number of children: 3  . Years of education: N/A   Occupational History  . Not on file.   Social History Main Topics  . Smoking status: Former Smoker    Packs/day: 0.50    Years: 38.00    Types: Cigarettes    Start date: 03/03/1974    Quit date: 05/14/2012  . Smokeless tobacco: Never Used     Comment: denies thoughts of restarting  . Alcohol use No  . Drug use: No  . Sexual activity: Not Currently    Birth control/ protection: Surgical   Other Topics Concern  . Not on file   Social History Narrative   Work or School: [employer] Situation:  lives with husband and daughters      Spiritual Beliefs: Christian      Lifestyle: no regular exercise; trying to eat healthy             FAMILY HISTORY: Family History  Problem Relation Age of Onset  . Heart disease Mother 42  . Hypertension Mother   . Stroke Father   . Hypertension Father   . Other Sister     Esophageal strictures  . Anemia Sister   . Colon cancer Neg Hx     ALLERGIES:  is allergic to ciprofloxacin; shrimp [shellfish allergy]; dicyclomine; methocarbamol; and penicillins.  MEDICATIONS:  Current Outpatient Prescriptions  Medication Sig Dispense Refill  . albuterol (PROAIR HFA) 108 (90 Base) MCG/ACT inhaler Inhale 2 puffs into the lungs every 4 (four) hours as needed for wheezing or shortness of breath. 1 Inhaler 0  . albuterol (PROVENTIL) (2.5 MG/3ML) 0.083% nebulizer solution Take 3 mLs (2.5 mg total) by nebulization every 6 (six) hours as needed for shortness of breath. 150 mL 2  . Amino Acids-Protein Hydrolys (  FEEDING SUPPLEMENT, PRO-STAT SUGAR FREE 64,) LIQD Place 30 mLs into feeding tube 2 (two) times daily. 900 mL 0  . estradiol (ESTRACE) 0.5 MG tablet Take 0.5 mg by mouth daily.    . ferrous sulfate 325 (65 FE) MG tablet Take 1 tablet (325 mg total) by mouth 2 (two) times daily with a meal. (Patient taking differently: Take 325 mg by mouth daily. ) 60 tablet 0  . ibuprofen (ADVIL,MOTRIN) 200 MG tablet Take 800 mg by mouth as needed for mild pain. Reported on 09/14/2015    . lactose free nutrition (BOOST PLUS) LIQD Take 237 mLs by mouth 3 (three) times daily with meals. 237 mL 2  . Nutritional Supplements (FEEDING SUPPLEMENT, JEVITY 1.5 CAL/FIBER,) LIQD Place 275 mLs into feeding tube 4 (four) times daily. 1000 mL 0  . nystatin cream (MYCOSTATIN) Apply 1 application topically 2 (two) times daily. 30 g 0  . Nystatin POWD Apply to wound twice a day - G-tube site  0  . omeprazole (PRILOSEC) 20 MG capsule Take 20 mg by mouth daily.    . predniSONE (DELTASONE)  10 MG tablet Take 4 tablets (40 mg total) by mouth daily. 20 tablet 0  . pyridostigmine (MESTINON) 60 MG tablet Take 1 tablet (60 mg total) by mouth 4 (four) times daily. 120 tablet 1  . sertraline (ZOLOFT) 100 MG tablet Take 50 mg by mouth 2 (two) times daily.     . Vitamin D, Ergocalciferol, (DRISDOL) 50000 units CAPS capsule Take 1 capsule (50,000 Units total) by mouth every 7 (seven) days. 4 capsule 0  . clonazePAM (KLONOPIN) 1 MG tablet Take 0.5-1 tablets (0.5-1 mg total) by mouth 2 (two) times daily as needed for anxiety. 45 tablet 2   No current facility-administered medications for this visit.     REVIEW OF SYSTEMS:    10 Point review of Systems was done is negative except as noted above.  PHYSICAL EXAMINATION: ECOG PERFORMANCE STATUS: 2 - Symptomatic, <50% confined to bed  . Vitals:   10/30/15 0922  BP: (!) 159/89  Pulse: 92  Resp: 17  Temp: 98.2 F (36.8 C)   Filed Weights   10/30/15 0922  Weight: 261 lb 3.2 oz (118.5 kg)   .Body mass index is 51.01 kg/m.  GENERAL:alert, in no acute distress and comfortable SKIN: skin color, texture, turgor are normal, no rashes or significant lesions EYES: normal, conjunctiva are pink and non-injected, sclera clear OROPHARYNX:no exudate, no erythema and lips, buccal mucosa, and tongue normal  NECK: supple, no JVD, thyroid normal size, non-tender, without nodularity LYMPH:  no palpable lymphadenopathy in the cervical, axillary or inguinal LUNGS: clear to auscultation with normal respiratory effort HEART: regular rate & rhythm,  no murmurs and no lower extremity edema ABDOMEN: abdomen soft, non-tender, normoactive bowel sounds  Musculoskeletal: no cyanosis of digits and no clubbing  PSYCH: alert & oriented x 3 with fluent speech NEURO: no focal motor/sensory deficits  LABORATORY DATA:  I have reviewed the data as listed  . CBC Latest Ref Rng & Units 10/30/2015 10/10/2015 08/07/2015  WBC 3.9 - 10.3 10e3/uL 19.7(H) 11.9(H) 5.9    Hemoglobin 11.6 - 15.9 g/dL 12.4 12.1 12.4  Hematocrit 34.8 - 46.6 % 38.9 40.4 41.7  Platelets 145 - 400 10e3/uL 251 245 258   . Lab Results  Component Value Date   RETICCTPCT 1.38 10/30/2015   RBC 5.39 10/30/2015   RETICCTABS 74.38 10/30/2015    . CBC    Component Value Date/Time  WBC 19.7 (H) 10/30/2015 1112   WBC 11.9 (H) 10/10/2015 0840   RBC 5.39 10/30/2015 1112   RBC 5.39 (H) 10/10/2015 0840   HGB 12.4 10/30/2015 1112   HCT 38.9 10/30/2015 1112   PLT 251 10/30/2015 1112   MCV 72.2 (L) 10/30/2015 1112   MCH 23.0 (L) 10/30/2015 1112   MCH 22.4 (L) 10/10/2015 0840   MCHC 31.9 10/30/2015 1112   MCHC 30.0 10/10/2015 0840   RDW 19.0 (H) 10/30/2015 1112   LYMPHSABS 1.0 10/30/2015 1112   MONOABS 0.4 10/30/2015 1112   EOSABS 0.0 10/30/2015 1112   BASOSABS 0.0 10/30/2015 1112     . CMP Latest Ref Rng & Units 11/07/2015 10/30/2015 10/10/2015  Glucose 65 - 99 mg/dL 73 94 91  BUN 7 - 25 mg/dL 17 21.6 13  Creatinine 0.50 - 1.05 mg/dL 0.72 0.7 0.54  Sodium 135 - 146 mmol/L 136 141 141  Potassium 3.5 - 5.3 mmol/L 4.1 4.2 3.3(L)  Chloride 98 - 110 mmol/L 101 - 104  CO2 20 - 31 mmol/L 28 26 28   Calcium 8.6 - 10.4 mg/dL 9.0 9.9 9.2  Total Protein 6.1 - 8.1 g/dL 6.2 7.1 -  Total Bilirubin 0.2 - 1.2 mg/dL 0.4 0.40 -  Alkaline Phos 33 - 130 U/L 66 80 -  AST 10 - 35 U/L 15 13 -  ALT 6 - 29 U/L 14 16 -   Component     Latest Ref Rng 04/08/2015 05/01/2015 05/03/2015  Iron     28 - 170 ug/dL 76    TIBC     250 - 450 ug/dL 286    Saturation Ratios     10.4 - 31.8 % 27    UIBC      210    Vitamin B12     180 - 914 pg/mL 353    Folate     >5.9 ng/mL 7.6    Ferritin     11 - 307 ng/mL 688 (H)    TSH       2.71   HIV     NONREACTIVE  NONREACTIVE   Vitamin D, 25-Hydroxy     30 - 100 ng/mL  12 (L)   LDH     125 - 245 U/L   395 (H)  Sed Rate     0 - 40 mm/hr   45 (H)  Haptoglobin     34 - 200 mg/dL   <10 (L)    RADIOGRAPHIC STUDIES: I have personally reviewed the  radiological images as listed and agreed with the findings in the report. No results found.  ASSESSMENT & PLAN:   55 year old African American female with newly diagnosed myasthenia gravis  #1 lymphadenopathy- bilateral axillary, left subclavian and cervical lymph nodes + splenomegaly -  HIV negative, infectious mono screen negative. Elevated LDH- nearly 400 previously but noted to have some hemolysis likely from IVIG. CTA chest 04/07/2015 showed lymphadenopathy and splenomegaly as noted above PET/CT 05/14/2015: -No hypermetabolic masses or lymphadenopathy identified. No splenomegaly - ?resolution due to steroids or a temporary process such as a a viral infection or autoimmune condition. #2 Myasthenia gravis - persistent ..being followed by neurology Plan -we have been re-consulted in the setting of persistent myasthenia gravis. -we shall rpt CT C/A/P to re-assess forLnadenopathy to check for any evidence of associated lymphoma. -LDH, sed rate and CRP -if no evidence of lymphoma but elevated inflammatory markers would need autoimmuneworkup -has been given referral to [facility] neurology for further management  of myasthenia gravis.  #3 Microcytic anemia. ?ACD. Previously had nl ferritin levels -rpt iron profile and ferritin levels   RTC with CT chest/abd/pelvis with labs in 2 weeks   All of the patients questions were answered in details to her apparent satisfaction. The patient knows to call the clinic with any problems, questions or concerns.  I spent 20 minutes counseling the patient face to face. The total time spent in the appointment was 20 minutes and more than 50% was on counseling and direct patient cares.    Sullivan Lone MD [place] AAHIVMS [facility] Hematology/Oncology Physician [facility]  (Office):       [phone] (Work cell):  [phone] (Fax):           [phone]

## 2015-11-20 ENCOUNTER — Other Ambulatory Visit: Payer: Self-pay | Admitting: *Deleted

## 2015-11-20 MED ORDER — ALBUTEROL SULFATE HFA 108 (90 BASE) MCG/ACT IN AERS: 2.0000 | INHALATION_SPRAY | RESPIRATORY_TRACT | 4 refills | Status: DC | PRN

## 2015-11-23 ENCOUNTER — Ambulatory Visit: Payer: BC Managed Care – PPO | Admitting: Hematology

## 2015-11-23 ENCOUNTER — Other Ambulatory Visit: Payer: BC Managed Care – PPO

## 2015-11-23 ENCOUNTER — Ambulatory Visit (HOSPITAL_COMMUNITY)
Admission: RE | Admit: 2015-11-23 | Discharge: 2015-11-23 | Disposition: A | Discharge status: Home / Self Care | Payer: BC Managed Care – PPO | Source: Ambulatory Visit | Attending: Hematology | Admitting: Hematology

## 2015-11-23 DIAGNOSIS — M461 Sacroiliitis, not elsewhere classified: Secondary | ICD-10-CM | POA: Insufficient documentation

## 2015-11-23 DIAGNOSIS — K5730 Diverticulosis of large intestine without perforation or abscess without bleeding: Secondary | ICD-10-CM | POA: Insufficient documentation

## 2015-11-23 DIAGNOSIS — J9811 Atelectasis: Secondary | ICD-10-CM | POA: Diagnosis not present

## 2015-11-23 DIAGNOSIS — R591 Generalized enlarged lymph nodes: Secondary | ICD-10-CM | POA: Insufficient documentation

## 2015-11-23 DIAGNOSIS — R161 Splenomegaly, not elsewhere classified: Secondary | ICD-10-CM | POA: Insufficient documentation

## 2015-11-23 DIAGNOSIS — D259 Leiomyoma of uterus, unspecified: Secondary | ICD-10-CM | POA: Diagnosis not present

## 2015-11-23 DIAGNOSIS — R740 Nonspecific elevation of levels of transaminase and lactic acid dehydrogenase [LDH]: Secondary | ICD-10-CM | POA: Insufficient documentation

## 2015-11-23 DIAGNOSIS — Z931 Gastrostomy status: Secondary | ICD-10-CM | POA: Insufficient documentation

## 2015-11-23 DIAGNOSIS — N289 Disorder of kidney and ureter, unspecified: Secondary | ICD-10-CM | POA: Diagnosis not present

## 2015-11-23 DIAGNOSIS — R7402 Elevation of levels of lactic acid dehydrogenase (LDH): Secondary | ICD-10-CM

## 2015-11-23 MED ORDER — IOPAMIDOL (ISOVUE-300) INJECTION 61%
100.0000 mL | Freq: Once | INTRAVENOUS | Status: AC | PRN
  Administered 2015-11-23: 100 mL via INTRAVENOUS

## 2015-11-24 ENCOUNTER — Other Ambulatory Visit: Payer: Self-pay | Admitting: Family Medicine

## 2015-11-24 DIAGNOSIS — G4734 Idiopathic sleep related nonobstructive alveolar hypoventilation: Secondary | ICD-10-CM

## 2015-11-24 DIAGNOSIS — G4733 Obstructive sleep apnea (adult) (pediatric): Secondary | ICD-10-CM

## 2015-11-24 NOTE — Procedures (Signed)
  Patient Name: Frances Medina, Frances Medina Date: 11/19/2015 Gender: Female D.O.B: 1960/09/02 Age (years): 58 Referring Provider: Leeanne Rio Height (inches): 72 Interpreting Physician: Baird Lyons MD, ABSM Weight (lbs): 264 RPSGT: Baxter Flattery BMI: 52 MRN: [id number] Neck Size: 16.00 CLINICAL INFORMATION Sleep Study Type: NPSG   Indication for sleep study: Fatigue, Hypertension, Obesity, Snoring, Witnesses Apnea / Gasping During Sleep   Epworth Sleepiness Score: 5/24   SLEEP STUDY TECHNIQUE As per the AASM Manual for the Scoring of Sleep and Associated Events v2.3 (April 2016) with a hypopnea requiring 4% desaturations. The channels recorded and monitored were frontal, central and occipital EEG, electrooculogram (EOG), submentalis EMG (chin), nasal and oral airflow, thoracic and abdominal wall motion, anterior tibialis EMG, snore microphone, electrocardiogram, and pulse oximetry.  MEDICATIONS Patient's medications include: charted for review Medications self-administered by patient during sleep study : ALBUTEROL/ATROVENT, PREDNISONE, SERTRALINE,  CLONAZEPAM.  SLEEP ARCHITECTURE The study was initiated at 10:53:05 PM and ended at 5:08:51 AM. Sleep onset time was 16.4 minutes and the sleep efficiency was 78.2%. The total sleep time was 293.8 minutes. Stage REM latency was N/A minutes. The patient spent 11.91% of the night in stage N1 sleep, 88.09% in stage N2 sleep, 0.00% in stage N3 and 0.00% in REM. Alpha intrusion was absent. Supine sleep was 0.00%.  RESPIRATORY PARAMETERS The overall apnea/hypopnea index (AHI) was 10.0 per hour. There were 1 total apneas, including 1 obstructive, 0 central and 0 mixed apneas. There were 48 hypopneas and 14 RERAs. The AHI during Stage REM sleep was N/A per hour. AHI while supine was N/A per hour. The mean oxygen saturation was 90.72%. The minimum SpO2 during sleep was 86.00%. Loud snoring was noted during this study.  CARDIAC  DATA The 2 lead EKG demonstrated sinus rhythm. The mean heart rate was 72.93 beats per minute. Other EKG findings include: None.  LEG MOVEMENT DATA The total PLMS were 0 with a resulting PLMS index of 0.00. Associated arousal with leg movement index was 0.0 .  IMPRESSIONS - Mild obstructive sleep apnea occurred during this study (AHI = 10.0/h). - Insufficient early events to meet protocol requirement for split CPAP titration - No significant central sleep apnea occurred during this study (CAI = 0.0/h). - Mild oxygen desaturation was noted during this study (Min O2 = 86.00%). - The patient snored with Loud snoring volume. - No cardiac abnormalities were noted during this study. - Clinically significant periodic limb movements did not occur during sleep. No significant associated arousals.  DIAGNOSIS - Obstructive Sleep Apnea (327.23 [G47.33 ICD-10]) - Nocturnal Hypoxemia (327.26 [G47.36 ICD-10])  RECOMMENDATIONS - Therapeutic CPAP titration to determine optimal pressure required to alleviate sleep disordered breathing. - Avoid alcohol, sedatives and other CNS depressants that may worsen sleep apnea and disrupt normal sleep architecture. - Sleep hygiene should be reviewed to assess factors that may improve sleep quality. - Weight management and regular exercise should be initiated or continued if appropriate.  [Electronically signed] 11/24/2015 01:31 PM  Baird Lyons MD, [place], American Board of Sleep Medicine   NPI: [id number]  [place], American Board of Sleep Medicine  ELECTRONICALLY SIGNED ON:  11/24/2015, 1:28 PM [place] PH: [phone]   FX: [phone] [place]

## 2015-11-24 NOTE — Progress Notes (Signed)
Referral for CPAP titration.

## 2015-11-26 ENCOUNTER — Encounter: Payer: Self-pay | Admitting: Hematology

## 2015-11-26 ENCOUNTER — Ambulatory Visit (HOSPITAL_BASED_OUTPATIENT_CLINIC_OR_DEPARTMENT_OTHER): Payer: BC Managed Care – PPO | Admitting: Hematology

## 2015-11-26 VITALS — BP 128/67 | HR 78 | Temp 97.8°F | Resp 18 | Ht 60.0 in | Wt 265.5 lb

## 2015-11-26 DIAGNOSIS — D509 Iron deficiency anemia, unspecified: Secondary | ICD-10-CM

## 2015-11-26 DIAGNOSIS — R590 Localized enlarged lymph nodes: Secondary | ICD-10-CM | POA: Diagnosis not present

## 2015-11-26 DIAGNOSIS — R591 Generalized enlarged lymph nodes: Secondary | ICD-10-CM

## 2015-11-26 DIAGNOSIS — R161 Splenomegaly, not elsewhere classified: Secondary | ICD-10-CM | POA: Diagnosis not present

## 2015-11-26 NOTE — Progress Notes (Signed)
[name]    HEMATOLOGY/ONCOLOGY CLINIC NOTE  Date of Service: .11/26/2015  Patient Care Team: [name], MD as PCP - General (Family Medicine)  CHIEF COMPLAINTS/PURPOSE OF CONSULTATION:   Lymphadenopathy, recent diagnosis of Myasthenia gravis  HISTORY OF PRESENTING ILLNESS:   Frances Medina is a wonderful 55 y.o. female who has been referred to [place] by Dr Lala Lund  for evaluation and management of Lymphadenopathy.  Patient has a history of hypertension,uterine fibroids, anxiety, migraines and recently diagnosed myasthenia gravis who was admitted to the hospital recently on 04/07/2015 with several day history of worsening shortness of breath.  She had a CTA of the chest that showed no pulmonary embolism but did show lymphadenopathy of her bilateral axillary, left subclavian and cervical lymph nodes and splenomegaly as well.  She was noted to have dysphagia related to her myasthenia and was noted to have a myasthenic crisis triggered by an Escherichia coli urinary tract infection.  She was seen by neurology and treated with high-dose steroids along with IVIG for 3 doses.  She was sent home on a prednisone taper and Mestinon with home physical therapy and home RN evaluation and management.  Her left eye ptosis and her dysphagia improved significantly with treatment.   She has completed her prednisone about 3 weeks ago and currently is only on Mestinon.  She has outpatient neurology followup.  She is here for further evaluation of her splenomegaly and lymphadenopathy. She notes no unexpected weight loss.  No fevers or chills.  No night sweats. Notes some abdominal fullness but no significant pain. Notes that her dysphagia is much improved on the Mestinon.  Has been having some generalized weakness. She notes no specific focal symptoms at this time.    Patient subsequently had a PET/CT on3/13/2017 which showed no hypermetabolic masses or lymphadenopathy though patient was on steroids..  No evidence of thymoma.   INTERVAL HISTORY Patient notes she is feeling well and is here for follow-up of her CT scan results. She notes no acute new symptoms. CT chest abdomen pelvis done shows no lymphadenopathy or splenomegaly and no evidence of lymphoma or thymoma. She continues to be on 10 mg of prednisone daily and reports that she has a follow-up with [facility] on 02/05/2016 for further evaluation and management of her myasthenia gravis. Patient notes no fevers/chills/night sweats/unexpected weight loss. No new LNadenopathy noted.  MEDICAL HISTORY:  Past Medical History:  Diagnosis Date  . Acute respiratory failure ([place])   . Acute respiratory failure with hypoxemia ([place])   . Aspiration into airway   . CAP (community acquired pneumonia) 08/05/2015  . Depression with anxiety   . Dysphagia 03/2015    EGD, Dr Carlean Purl. mild antral gastritis, ? due to Ibuprofen.  no stricture but empirically maloney dilated esophagus.   . E. coli UTI 04/07/2015  . Endotracheally intubated   . Enteritis due to Clostridium difficile   . Fibroid uterus    size of a dime  . Gastrostomy infection ([place]) 11/08/2015  . GERD (gastroesophageal reflux disease)    hiatal hernia  . Hypertension    "went away when I stopped smoking"  . Knee injury   . Migraine    "maybe couple times/month" (03/20/2015)  . Myasthenia gravis ([place]) 2017  . Myasthenia gravis with acute exacerbation ([place]) 05/15/2015  . Osteoarthritis of left knee 12/02/2013  . Osteoarthritis of right knee 08/30/2013  . Protein-calorie malnutrition, severe ([place]) 08/07/2015  . Seasonal allergies    takes Zytrec  .  Tracheostomy status ([place])   . Tumors    "in my stomach"  . Umbilical hernia    watching , no plans for surgery at present  . VAP (ventilator-associated pneumonia) ([place])     SURGICAL HISTORY: Past Surgical History:  Procedure Laterality Date  . CESAREAN SECTION  1987; 1989  . COLONOSCOPY WITH PROPOFOL N/A 09/19/2013   Procedure:  COLONOSCOPY WITH PROPOFOL;  Surgeon: Ladene Artist, MD;  Location: [facility];  Service: Endoscopy;  Laterality: N/A;  . DILATION AND CURETTAGE OF UTERUS    . ESOPHAGOGASTRODUODENOSCOPY N/A 08/10/2015   Procedure: ESOPHAGOGASTRODUODENOSCOPY (EGD);  Surgeon: Gatha Mayer, MD;  Location: [facility] ENDOSCOPY;  Service: Endoscopy;  Laterality: N/A;  . ESOPHAGOGASTRODUODENOSCOPY (EGD) WITH PROPOFOL N/A 03/21/2015   Procedure: ESOPHAGOGASTRODUODENOSCOPY (EGD) WITH PROPOFOL;  Surgeon: Gatha Mayer, MD;  Location: [place];  Service: Endoscopy;  Laterality: N/A;  . PARTIAL KNEE ARTHROPLASTY Right 08/30/2013   Procedure: RIGHT UNICOMPARTMENTAL KNEE;  Surgeon: Johnny Bridge, MD;  Location: [place];  Service: Orthopedics;  Laterality: Right;  . PARTIAL KNEE ARTHROPLASTY Left 12/02/2013   Procedure: LEFT KNEE UNI ARTHROPLASTY;  Surgeon: Johnny Bridge, MD;  Location: [place];  Service: Orthopedics;  Laterality: Left;  . PEG PLACEMENT N/A 08/10/2015   Procedure: PERCUTANEOUS ENDOSCOPIC GASTROSTOMY (PEG) PLACEMENT;  Surgeon: Gatha Mayer, MD;  Location: [place];  Service: Endoscopy;  Laterality: N/A;  . TUBAL LIGATION  1989  . VAGINAL HYSTERECTOMY  1990's?   "apparently took out one of my ovaries at the time too cause one's missing"    SOCIAL HISTORY: Social History   Social History  . Marital status: Married    Spouse name: N/A  . Number of children: 3  . Years of education: N/A   Occupational History  . Not on file.   Social History Main Topics  . Smoking status: Former Smoker    Packs/day: 0.50    Years: 38.00    Types: Cigarettes    Start date: 03/03/1974    Quit date: 05/14/2012  . Smokeless tobacco: Never Used     Comment: denies thoughts of restarting  . Alcohol use No  . Drug use: No  . Sexual activity: Not Currently    Birth control/ protection: Surgical   Other Topics Concern  . Not on file   Social History Narrative   Work or School: [employer] Situation: lives with husband and daughters      Spiritual Beliefs: Christian      Lifestyle: no regular exercise; trying to eat healthy             FAMILY HISTORY: Family History  Problem Relation Age of Onset  . Heart disease Mother 67  . Hypertension Mother   . Stroke Father   . Hypertension Father   . Other Sister     Esophageal strictures  . Anemia Sister   . Colon cancer Neg Hx     ALLERGIES:  is allergic to ciprofloxacin; shrimp [shellfish allergy]; dicyclomine; methocarbamol; and penicillins.  MEDICATIONS:  Current Outpatient Prescriptions  Medication Sig Dispense Refill  . albuterol (PROAIR HFA) 108 (90 Base) MCG/ACT inhaler Inhale 2 puffs into the lungs every 4 (four) hours as needed for wheezing or shortness of breath. 1 Inhaler 4  . albuterol (PROVENTIL) (2.5 MG/3ML) 0.083% nebulizer solution Take 3 mLs (2.5 mg total) by nebulization every 6 (six) hours as needed for shortness of breath. 150 mL  2  . Amino Acids-Protein Hydrolys (FEEDING SUPPLEMENT, PRO-STAT SUGAR FREE 64,) LIQD Place 30 mLs into feeding tube 2 (two) times daily. 900 mL 0  . clonazePAM (KLONOPIN) 1 MG tablet Take 0.5-1 tablets (0.5-1 mg total) by mouth 2 (two) times daily as needed for anxiety. 45 tablet 2  . estradiol (ESTRACE) 0.5 MG tablet Take 0.5 mg by mouth daily.    . ferrous sulfate 325 (65 FE) MG tablet Take 1 tablet (325 mg total) by mouth 2 (two) times daily with a meal. (Patient taking differently: Take 325 mg by mouth daily. ) 60 tablet 0  . ibuprofen (ADVIL,MOTRIN) 200 MG tablet Take 800 mg by mouth as needed for mild pain. Reported on 09/14/2015    . lactose free nutrition (BOOST PLUS) LIQD Take 237 mLs by mouth 3 (three) times daily with meals. 237 mL 2  . Nutritional Supplements (FEEDING SUPPLEMENT, JEVITY 1.5 CAL/FIBER,) LIQD Place 275 mLs into feeding tube 4 (four) times daily. 1000 mL 0  . nystatin cream (MYCOSTATIN) Apply 1 application topically 2  (two) times daily. 30 g 0  . Nystatin POWD Apply to wound twice a day - G-tube site  0  . omeprazole (PRILOSEC) 20 MG capsule Take 20 mg by mouth daily.    . predniSONE (DELTASONE) 10 MG tablet Take 4 tablets (40 mg total) by mouth daily. 20 tablet 0  . pyridostigmine (MESTINON) 60 MG tablet Take 1 tablet (60 mg total) by mouth 4 (four) times daily. 120 tablet 1  . sertraline (ZOLOFT) 100 MG tablet Take 50 mg by mouth 2 (two) times daily.     . Vitamin D, Ergocalciferol, (DRISDOL) 50000 units CAPS capsule Take 1 capsule (50,000 Units total) by mouth every 7 (seven) days. 4 capsule 0   No current facility-administered medications for this visit.     REVIEW OF SYSTEMS:    10 Point review of Systems was done is negative except as noted above.  PHYSICAL EXAMINATION: ECOG PERFORMANCE STATUS: 2 - Symptomatic, <50% confined to bed  . Vitals:   11/26/15 0850  BP: 128/67  Pulse: 78  Resp: 18  Temp: 97.8 F (36.6 C)   Filed Weights   11/26/15 0850  Weight: 265 lb 8 oz (120.4 kg)   .Body mass index is 51.85 kg/m.  GENERAL:alert, in no acute distress and comfortable SKIN: skin color, texture, turgor are normal, no rashes or significant lesions EYES: normal, conjunctiva are pink and non-injected, sclera clear OROPHARYNX:no exudate, no erythema and lips, buccal mucosa, and tongue normal  NECK: supple, no JVD, thyroid normal size, non-tender, without nodularity LYMPH:  no palpable lymphadenopathy in the cervical, axillary or inguinal LUNGS: clear to auscultation with normal respiratory effort HEART: regular rate & rhythm,  no murmurs and no lower extremity edema ABDOMEN: abdomen soft, non-tender, normoactive bowel sounds  Musculoskeletal: no cyanosis of digits and no clubbing  PSYCH: alert & oriented x 3 with fluent speech NEURO: no focal motor/sensory deficits  LABORATORY DATA:  I have reviewed the data as listed  . CBC Latest Ref Rng & Units 10/30/2015 10/10/2015 08/07/2015  WBC 3.9  - 10.3 10e3/uL 19.7(H) 11.9(H) 5.9  Hemoglobin 11.6 - 15.9 g/dL 12.4 12.1 12.4  Hematocrit 34.8 - 46.6 % 38.9 40.4 41.7  Platelets 145 - 400 10e3/uL 251 245 258   . Lab Results  Component Value Date   RETICCTPCT 1.38 10/30/2015   RBC 5.39 10/30/2015   RETICCTABS 74.38 10/30/2015    . CBC    Component  Value Date/Time   WBC 19.7 (H) 10/30/2015 1112   WBC 11.9 (H) 10/10/2015 0840   RBC 5.39 10/30/2015 1112   RBC 5.39 (H) 10/10/2015 0840   HGB 12.4 10/30/2015 1112   HCT 38.9 10/30/2015 1112   PLT 251 10/30/2015 1112   MCV 72.2 (L) 10/30/2015 1112   MCH 23.0 (L) 10/30/2015 1112   MCH 22.4 (L) 10/10/2015 0840   MCHC 31.9 10/30/2015 1112   MCHC 30.0 10/10/2015 0840   RDW 19.0 (H) 10/30/2015 1112   LYMPHSABS 1.0 10/30/2015 1112   MONOABS 0.4 10/30/2015 1112   EOSABS 0.0 10/30/2015 1112   BASOSABS 0.0 10/30/2015 1112     . CMP Latest Ref Rng & Units 11/07/2015 10/30/2015 10/10/2015  Glucose 65 - 99 mg/dL 73 94 91  BUN 7 - 25 mg/dL 17 21.6 13  Creatinine 0.50 - 1.05 mg/dL 0.72 0.7 0.54  Sodium 135 - 146 mmol/L 136 141 141  Potassium 3.5 - 5.3 mmol/L 4.1 4.2 3.3(L)  Chloride 98 - 110 mmol/L 101 - 104  CO2 20 - 31 mmol/L 28 26 28   Calcium 8.6 - 10.4 mg/dL 9.0 9.9 9.2  Total Protein 6.1 - 8.1 g/dL 6.2 7.1 -  Total Bilirubin 0.2 - 1.2 mg/dL 0.4 0.40 -  Alkaline Phos 33 - 130 U/L 66 80 -  AST 10 - 35 U/L 15 13 -  ALT 6 - 29 U/L 14 16 -   . Lab Results  Component Value Date   LDH 277 (H) 10/30/2015     RADIOGRAPHIC STUDIES: I have personally reviewed the radiological images as listed and agreed with the findings in the report. Ct Chest W Contrast  Result Date: 11/23/2015 CLINICAL DATA:  Myasthenia gravis. Lymphadenopathy. Assessment for lymphoma or thymoma. Bleeding and irritation at percutaneous gastrostomy tube site. EXAM: CT CHEST, ABDOMEN, AND PELVIS WITH CONTRAST TECHNIQUE: Multidetector CT imaging of the chest, abdomen and pelvis was performed following the standard  protocol during bolus administration of intravenous contrast. CONTRAST:  141mL ISOVUE-300 IOPAMIDOL (ISOVUE-300) INJECTION 61% COMPARISON:  Nuclear medicine PET-CT from 05/14/2015 FINDINGS: CT CHEST FINDINGS Cardiovascular: No significant atherosclerotic calcification in the chest. Heart size normal. Mediastinum/Nodes: No findings of thymoma or pathologic mediastinal adenopathy. Lungs/Pleura: There is atelectasis along both hemidiaphragms, in the lower lobes, right middle lobe, and to a lesser extent in the lingula. Otherwise the lungs appear clear. Musculoskeletal: Thoracic spondylosis. CT ABDOMEN PELVIS FINDINGS Hepatobiliary: Unremarkable Pancreas: Unremarkable Spleen: Unremarkable Adrenals/Urinary Tract: Adrenal glands normal. A 1.7 by 1.3 cm left lower lobe partially exophytic hypodense lesion measures 5 Hounsfield units on portal venous phase images and 6 Hounsfield units on delayed phase images, this measured similarly on 11/22/2008 and is compatible with a cyst. Exophytic from the left kidney lower pole there is a 0.8 cm lesion which is technically too small to characterize, but not changed in size from 2010. Left-sided bladder diverticulum. Stomach/Bowel: Peg tube noted. No abnormal fluid collection or abnormal findings tracking along the PEG tube. The retention bowel component is well seated in the stomach. No abscess. Sigmoid colon diverticula. Vascular/Lymphatic: Small retroperitoneal lymph nodes are not pathologically enlarged. No significant atherosclerotic calcification in the abdomen. Reproductive: Mildly accentuated density and enlargement of the fundus of the uterus, favoring fundal fibroids. Similar findings shown on pelvic ultrasound 08/21/2009. Other: No supplemental non-categorized findings. Musculoskeletal: Sclerosis along both sacroiliac joints compatible with bilateral chronic sacroiliitis. Lower lumbar degenerative facet arthropathy without impingement. Small umbilical hernia contains  adipose tissue. IMPRESSION: 1. No findings of thymoma  or lymphoma in the chest, abdomen, or pelvis. 2. No abscess or significant inflammatory process around the PEG tube identified. The PEG tube is appropriately positioned. 3. Atelectasis along both hemidiaphragms. 4. Several small hypodense lesions of the left kidney lower pole are present. One of these is stable from 2010 and compatible with benign cyst. The other is stable from 2010 and technically nonspecific due to small size, although given the lack of change over the last 7 years is likely to be a benign lesion as well. 5. Mild sigmoid colon diverticulosis. 6. Uterine fibroids especially along the fundus. 7. Bilateral chronic sacroiliitis. Electronically Signed   By: Van Clines M.D.   On: 11/23/2015 15:40   Ct Abdomen Pelvis W Contrast  Result Date: 11/23/2015 CLINICAL DATA:  Myasthenia gravis. Lymphadenopathy. Assessment for lymphoma or thymoma. Bleeding and irritation at percutaneous gastrostomy tube site. EXAM: CT CHEST, ABDOMEN, AND PELVIS WITH CONTRAST TECHNIQUE: Multidetector CT imaging of the chest, abdomen and pelvis was performed following the standard protocol during bolus administration of intravenous contrast. CONTRAST:  158mL ISOVUE-300 IOPAMIDOL (ISOVUE-300) INJECTION 61% COMPARISON:  Nuclear medicine PET-CT from 05/14/2015 FINDINGS: CT CHEST FINDINGS Cardiovascular: No significant atherosclerotic calcification in the chest. Heart size normal. Mediastinum/Nodes: No findings of thymoma or pathologic mediastinal adenopathy. Lungs/Pleura: There is atelectasis along both hemidiaphragms, in the lower lobes, right middle lobe, and to a lesser extent in the lingula. Otherwise the lungs appear clear. Musculoskeletal: Thoracic spondylosis. CT ABDOMEN PELVIS FINDINGS Hepatobiliary: Unremarkable Pancreas: Unremarkable Spleen: Unremarkable Adrenals/Urinary Tract: Adrenal glands normal. A 1.7 by 1.3 cm left lower lobe partially exophytic  hypodense lesion measures 5 Hounsfield units on portal venous phase images and 6 Hounsfield units on delayed phase images, this measured similarly on 11/22/2008 and is compatible with a cyst. Exophytic from the left kidney lower pole there is a 0.8 cm lesion which is technically too small to characterize, but not changed in size from 2010. Left-sided bladder diverticulum. Stomach/Bowel: Peg tube noted. No abnormal fluid collection or abnormal findings tracking along the PEG tube. The retention bowel component is well seated in the stomach. No abscess. Sigmoid colon diverticula. Vascular/Lymphatic: Small retroperitoneal lymph nodes are not pathologically enlarged. No significant atherosclerotic calcification in the abdomen. Reproductive: Mildly accentuated density and enlargement of the fundus of the uterus, favoring fundal fibroids. Similar findings shown on pelvic ultrasound 08/21/2009. Other: No supplemental non-categorized findings. Musculoskeletal: Sclerosis along both sacroiliac joints compatible with bilateral chronic sacroiliitis. Lower lumbar degenerative facet arthropathy without impingement. Small umbilical hernia contains adipose tissue. IMPRESSION: 1. No findings of thymoma or lymphoma in the chest, abdomen, or pelvis. 2. No abscess or significant inflammatory process around the PEG tube identified. The PEG tube is appropriately positioned. 3. Atelectasis along both hemidiaphragms. 4. Several small hypodense lesions of the left kidney lower pole are present. One of these is stable from 2010 and compatible with benign cyst. The other is stable from 2010 and technically nonspecific due to small size, although given the lack of change over the last 7 years is likely to be a benign lesion as well. 5. Mild sigmoid colon diverticulosis. 6. Uterine fibroids especially along the fundus. 7. Bilateral chronic sacroiliitis. Electronically Signed   By: Van Clines M.D.   On: 11/23/2015 15:40    ASSESSMENT  & PLAN:   55 year old African American female with newly diagnosed myasthenia gravis  #1 lymphadenopathy- bilateral axillary, left subclavian and cervical lymph nodes + splenomegaly on CTA of the chest in February 2017 HIV negative, infectious mono  screen negative. Elevated LDH- nearly 400 previously but noted to have some hemolysis likely from IVIG. This is now down to near normal. CTA chest 04/07/2015 showed lymphadenopathy and splenomegaly as noted above PET/CT 05/14/2015: -No hypermetabolic masses or lymphadenopathy identified. No splenomegaly - ?resolution due to steroids or a temporary process such as a a viral infection or autoimmune condition. #2 Myasthenia gravis - persistent ..being followed by neurology Plan -patient had a CT of the chest abdomen pelvis as noted above show no evidence of lymphoma or thymoma. -Given her family history of some autoimmune disease would recommend consideration of rheumatology evaluation to rule out other associated autoimmune conditions that might drive her primary treatment. -If no evidence of other associated autoimmune conditions the primary treatment for her myasthenia gravis would be driven by her neurology team. We'll defer to them regarding considerations for the use of Rituxan or consideration of thymectomy for other specific interventions. -has been given referral to [facility] neurology for further management of myasthenia gravis plan she'll be seeing them on 02/05/2016.  -We shall defer further follow-up and management of the primary care physician and neurology. -We shall be happy to help administer Rituxan if that is the treatment decided by neurology.  #3 Microcytic anemia. ?ACD. Previously had nl ferritin levels -rpt iron profile and ferritin levels with PCP.  RTC with Dr. Irene Limbo on an as-needed basis. Continue follow-up with primary care physician and neurology team.   All of the patients questions were answered in details to her apparent  satisfaction. The patient knows to call the clinic with any problems, questions or concerns.  I spent 20 minutes counseling the patient face to face. The total time spent in the appointment was 25 minutes and more than 50% was on counseling and direct patient cares.    Sullivan Lone MD [place] AAHIVMS [facility] Hematology/Oncology Physician [facility]  (Office):       [phone] (Work cell):  [phone] (Fax):           [phone]

## 2015-11-28 ENCOUNTER — Telehealth: Payer: Self-pay

## 2015-11-28 NOTE — Telephone Encounter (Signed)
Spoke with the pt and scheduled appt with AD for 01/17/16 at 2 pm  Pt aware to arrive 15 min prior for check in  She req that I mail her consult forms, so I have verified her address and forms were mailed

## 2015-11-28 NOTE — Progress Notes (Signed)
With any sleep dr in office

## 2015-11-29 ENCOUNTER — Telehealth: Payer: Self-pay | Admitting: Family Medicine

## 2015-11-29 NOTE — Telephone Encounter (Signed)
Sleep study report discussed with her. She already have CPAP titration appointment set up. I also discussed her oncology visit with her and recommendation to be screened for autoimmune disorder. She is advised to schedule follow up appointment with me soon. She verbalized understanding.

## 2015-12-02 ENCOUNTER — Telehealth: Payer: Self-pay | Admitting: Hematology

## 2015-12-02 NOTE — Telephone Encounter (Signed)
No LOS.

## 2015-12-07 ENCOUNTER — Other Ambulatory Visit: Payer: BC Managed Care – PPO

## 2015-12-07 DIAGNOSIS — E559 Vitamin D deficiency, unspecified: Secondary | ICD-10-CM

## 2015-12-08 ENCOUNTER — Encounter: Payer: Self-pay | Admitting: Family Medicine

## 2015-12-08 LAB — VITAMIN D 25 HYDROXY (VIT D DEFICIENCY, FRACTURES): Vit D, 25-Hydroxy: 30 ng/mL (ref 30–100)

## 2015-12-09 ENCOUNTER — Ambulatory Visit (HOSPITAL_BASED_OUTPATIENT_CLINIC_OR_DEPARTMENT_OTHER): Payer: BC Managed Care – PPO | Attending: Family Medicine | Admitting: Internal Medicine

## 2015-12-09 DIAGNOSIS — G4734 Idiopathic sleep related nonobstructive alveolar hypoventilation: Secondary | ICD-10-CM | POA: Insufficient documentation

## 2015-12-09 DIAGNOSIS — G4733 Obstructive sleep apnea (adult) (pediatric): Secondary | ICD-10-CM | POA: Diagnosis not present

## 2015-12-09 DIAGNOSIS — Z9989 Dependence on other enabling machines and devices: Secondary | ICD-10-CM

## 2015-12-10 ENCOUNTER — Encounter: Payer: Self-pay | Admitting: Family Medicine

## 2015-12-15 DIAGNOSIS — G4733 Obstructive sleep apnea (adult) (pediatric): Secondary | ICD-10-CM

## 2015-12-15 DIAGNOSIS — Z9989 Dependence on other enabling machines and devices: Secondary | ICD-10-CM | POA: Diagnosis not present

## 2015-12-15 NOTE — Procedures (Signed)
   Patient Name: Frances Medina, Frances Medina Date: 12/09/2015 Gender: Female D.O.B: 1960/11/25 Age (years): 45 Referring Provider: Leeanne Rio Height (inches): 60 Interpreting Physician: Baird Lyons MD, ABSM Weight (lbs): 264 RPSGT: Jonna Coup BMI: 52 MRN: [id number] Neck Size: 16.00 CLINICAL INFORMATION The patient is referred for a CPAP titration to treat sleep apnea. Date of NPSG, Split Night or HST:  SLEEP STUDY TECHNIQUE As per the AASM Manual for the Scoring of Sleep and Associated Events v2.3 (April 2016) with a hypopnea requiring 4% desaturations. The channels recorded and monitored were frontal, central and occipital EEG, electrooculogram (EOG), submentalis EMG (chin), nasal and oral airflow, thoracic and abdominal wall motion, anterior tibialis EMG, snore microphone, electrocardiogram, and pulse oximetry. Continuous positive airway pressure (CPAP) was initiated at the beginning of the study and titrated to treat sleep-disordered breathing.  MEDICATIONS Medications self-administered by patient taken the night of the study : ALBUTEROL/ATROVENT, PREDNISONE, SERTRALINE, CLONAZEPAM  TECHNICIAN COMMENTS Comments added by technician: NONE  Comments added by scorer: N/A  RESPIRATORY PARAMETERS Optimal PAP Pressure (cm): 14 AHI at Optimal Pressure (/hr): 0.0 Overall Minimal O2 (%): 83.00 Supine % at Optimal Pressure (%): 0 Minimal O2 at Optimal Pressure (%): 94.0    SLEEP ARCHITECTURE The study was initiated at 10:40:14 PM and ended at 4:42:44 AM. Sleep onset time was 11.5 minutes and the sleep efficiency was 85.2%. The total sleep time was 309.0 minutes. The patient spent 1.62% of the night in stage N1 sleep, 73.46% in stage N2 sleep, 0.00% in stage N3 and 24.92% in REM.Stage REM latency was 113.5 minutes Wake after sleep onset was 42.0. Alpha intrusion was absent. Supine sleep was 62.30%.  CARDIAC DATA The 2 lead EKG demonstrated sinus rhythm. The mean heart rate  was 69.93 beats per minute. Other EKG findings include: None.  LEG MOVEMENT DATA The total Periodic Limb Movements of Sleep (PLMS) were 0. The PLMS index was 0.00. A PLMS index of <15 is considered normal in adults.  IMPRESSIONS The optimal PAP pressure was 14 cm of water. Central sleep apnea was not noted during this titration (CAI = 0.2/h). Moderate oxygen desaturations were observed during this titration (min O2 = 83.00%). The patient snored with Moderate snoring volume during this titration study. No cardiac abnormalities were observed during this study. Clinically significant periodic limb movements were not noted during this study. Arousals associated with PLMs were rare.  DIAGNOSIS Obstructive Sleep Apnea (327.23 [G47.33 ICD-10])  RECOMMENDATIONS Trial of CPAP therapy on 14 cm H2O with a Small size Fisher&Paykel Full Face Mask Simplus mask and heated humidification. Avoid alcohol, sedatives and other CNS depressants that may worsen sleep apnea and disrupt normal sleep architecture. Sleep hygiene should be reviewed to assess factors that may improve sleep quality. Weight management and regular exercise should be initiated or continued.   [Electronically signed] 12/15/2015 12:41 PM  Baird Lyons MD, [place], American Board of Sleep Medicine   NPI: [id number]  [place], American Board of Sleep Medicine  ELECTRONICALLY SIGNED ON:  12/15/2015, 12:39 PM [place] PH: [phone]   FX: [phone] [place]

## 2015-12-17 ENCOUNTER — Other Ambulatory Visit: Payer: Self-pay | Admitting: Family Medicine

## 2015-12-17 DIAGNOSIS — G4734 Idiopathic sleep related nonobstructive alveolar hypoventilation: Secondary | ICD-10-CM

## 2015-12-17 DIAGNOSIS — G4733 Obstructive sleep apnea (adult) (pediatric): Secondary | ICD-10-CM

## 2015-12-17 NOTE — Progress Notes (Signed)
Sleep study report. Will order CPAP machine    IMPRESSIONS The optimal PAP pressure was 14 cm of water. Central sleep apnea was not noted during this titration (CAI =  0.2/h). Moderate oxygen desaturations were observed during this titration  (min O2 = 83.00%). The patient snored with Moderate snoring volume during this  titration study. No cardiac abnormalities were observed during this study. Clinically significant periodic limb movements were not noted  during this study. Arousals associated with PLMs were rare.  DIAGNOSIS Obstructive Sleep Apnea (327.23 [G47.33 ICD-10])  RECOMMENDATIONS Trial of CPAP therapy on 14 cm H2O with a Small size  [employer] Full Face Mask Simplus mask and heated  humidification. Avoid alcohol, sedatives and other CNS depressants that may  worsen sleep apnea and disrupt normal sleep architecture. Sleep hygiene should be reviewed to assess factors that may  improve sleep quality. Weight management and regular exercise should be initiated or  continued.

## 2015-12-20 ENCOUNTER — Other Ambulatory Visit: Payer: Self-pay | Admitting: *Deleted

## 2015-12-21 ENCOUNTER — Other Ambulatory Visit: Payer: Self-pay | Admitting: *Deleted

## 2015-12-21 MED ORDER — SERTRALINE HCL 100 MG PO TABS: 50.0000 mg | ORAL_TABLET | Freq: Two times a day (BID) | ORAL | 1 refills | Status: DC

## 2015-12-21 NOTE — Telephone Encounter (Signed)
Patient states that is fine not getting a script on the estrace and also states that her previous PCP was the one who originally wrote for the sertraline.  Patient said that pharmacy sent the last refill request to the old provider and his office refilled it with only enough for 1 month and she made sure pharmacy sent it to the right provider this time.  Patient does need the sertraline since her month supply is out. Jazmin Hartsell,CMA

## 2015-12-21 NOTE — Telephone Encounter (Signed)
Spoke with provider and verbal ok given to send script to the pharmacy.  Patient is out of medication and didn't want to go the weekend without it. Robt Okuda,CMA

## 2015-12-21 NOTE — Telephone Encounter (Signed)
-----   Message from [name], MD sent at 12/20/2015  4:33 PM EDT ----- Please check with patient. I got refill request for Estrace and Sertraline which I never prescribe to her. I had discussed with her in the past that I will not prescribe estrace given risk for cancer. Also when was the last time she used the sertraline and who has been prescribing it to her?

## 2016-01-02 ENCOUNTER — Other Ambulatory Visit: Payer: Self-pay | Admitting: Family Medicine

## 2016-01-02 ENCOUNTER — Other Ambulatory Visit: Payer: Self-pay | Admitting: *Deleted

## 2016-01-02 MED ORDER — OMEPRAZOLE 20 MG PO CPDR: 20.0000 mg | DELAYED_RELEASE_CAPSULE | Freq: Every day | ORAL | 1 refills | Status: DC

## 2016-01-02 NOTE — Telephone Encounter (Signed)
Spoke with patient and she states that it was given to her while she was admitted in the hospital for her acid reflux.  She was never told to follow up with GI and hasn't seen them since discharge.  Patient does take this medication everyday but doesn't have a GI provider that she sees for it. Jazmin Hartsell,CMA

## 2016-01-02 NOTE — Telephone Encounter (Signed)
Medication was prescribed by GI. Please have patient request refill from GI. All need to confirm how long GI wants her to be on it since it can cause renal impairment.

## 2016-01-02 NOTE — Telephone Encounter (Signed)
She does have a GI specialist ( Dr Carlean Purl) She saw him in Sept months after she left the hospital. I will however refill meds, given she already knows the risk and she has been doing well on it.

## 2016-01-15 ENCOUNTER — Other Ambulatory Visit: Payer: Self-pay | Admitting: Family Medicine

## 2016-01-17 ENCOUNTER — Encounter (HOSPITAL_COMMUNITY): Payer: Self-pay | Admitting: Emergency Medicine

## 2016-01-17 ENCOUNTER — Other Ambulatory Visit: Payer: Self-pay

## 2016-01-17 ENCOUNTER — Ambulatory Visit (INDEPENDENT_AMBULATORY_CARE_PROVIDER_SITE_OTHER): Payer: BC Managed Care – PPO | Admitting: Family Medicine

## 2016-01-17 ENCOUNTER — Emergency Department (HOSPITAL_COMMUNITY)
Admission: EM | Admit: 2016-01-17 | Discharge: 2016-01-17 | Disposition: A | Discharge status: Home / Self Care | Payer: BC Managed Care – PPO | Attending: Emergency Medicine | Admitting: Emergency Medicine

## 2016-01-17 ENCOUNTER — Institutional Professional Consult (permissible substitution): Payer: BC Managed Care – PPO | Admitting: Pulmonary Disease

## 2016-01-17 ENCOUNTER — Encounter: Payer: Self-pay | Admitting: Family Medicine

## 2016-01-17 ENCOUNTER — Emergency Department (HOSPITAL_COMMUNITY): Payer: BC Managed Care – PPO

## 2016-01-17 DIAGNOSIS — I10 Essential (primary) hypertension: Secondary | ICD-10-CM | POA: Insufficient documentation

## 2016-01-17 DIAGNOSIS — J40 Bronchitis, not specified as acute or chronic: Secondary | ICD-10-CM | POA: Insufficient documentation

## 2016-01-17 DIAGNOSIS — Z87891 Personal history of nicotine dependence: Secondary | ICD-10-CM | POA: Diagnosis not present

## 2016-01-17 DIAGNOSIS — R0602 Shortness of breath: Secondary | ICD-10-CM | POA: Diagnosis not present

## 2016-01-17 DIAGNOSIS — Z96653 Presence of artificial knee joint, bilateral: Secondary | ICD-10-CM | POA: Diagnosis not present

## 2016-01-17 LAB — I-STAT CHEM 8, ED
BUN: 13 mg/dL (ref 6–20)
Calcium, Ion: 1.14 mmol/L — ABNORMAL LOW (ref 1.15–1.40)
Chloride: 105 mmol/L (ref 101–111)
Creatinine, Ser: 0.8 mg/dL (ref 0.44–1.00)
Glucose, Bld: 107 mg/dL — ABNORMAL HIGH (ref 65–99)
HCT: 37 % (ref 36.0–46.0)
Hemoglobin: 12.6 g/dL (ref 12.0–15.0)
Potassium: 3.4 mmol/L — ABNORMAL LOW (ref 3.5–5.1)
Sodium: 143 mmol/L (ref 135–145)
TCO2: 26 mmol/L (ref 0–100)

## 2016-01-17 LAB — CBC WITH DIFFERENTIAL/PLATELET
Basophils Absolute: 0 10*3/uL (ref 0.0–0.1)
Basophils Relative: 0 %
Eosinophils Absolute: 0 10*3/uL (ref 0.0–0.7)
Eosinophils Relative: 0 %
HCT: 37.1 % (ref 36.0–46.0)
Hemoglobin: 11.6 g/dL — ABNORMAL LOW (ref 12.0–15.0)
Lymphocytes Relative: 8 %
Lymphs Abs: 1 10*3/uL (ref 0.7–4.0)
MCH: 22.7 pg — ABNORMAL LOW (ref 26.0–34.0)
MCHC: 31.3 g/dL (ref 30.0–36.0)
MCV: 72.5 fL — ABNORMAL LOW (ref 78.0–100.0)
Monocytes Absolute: 0.5 10*3/uL (ref 0.1–1.0)
Monocytes Relative: 3 %
Neutro Abs: 11.7 10*3/uL — ABNORMAL HIGH (ref 1.7–7.7)
Neutrophils Relative %: 89 %
Platelets: 266 10*3/uL (ref 150–400)
RBC: 5.12 MIL/uL — ABNORMAL HIGH (ref 3.87–5.11)
RDW: 17.5 % — ABNORMAL HIGH (ref 11.5–15.5)
WBC: 13.2 10*3/uL — ABNORMAL HIGH (ref 4.0–10.5)

## 2016-01-17 LAB — BASIC METABOLIC PANEL
Anion gap: 9 (ref 5–15)
BUN: 12 mg/dL (ref 6–20)
CO2: 26 mmol/L (ref 22–32)
Calcium: 9.2 mg/dL (ref 8.9–10.3)
Chloride: 106 mmol/L (ref 101–111)
Creatinine, Ser: 0.68 mg/dL (ref 0.44–1.00)
GFR calc Af Amer: >60 mL/min (ref >60)
GFR calc non Af Amer: >60 mL/min (ref >60)
Glucose, Bld: 112 mg/dL — ABNORMAL HIGH (ref 65–99)
Potassium: 3.4 mmol/L — ABNORMAL LOW (ref 3.5–5.1)
Sodium: 141 mmol/L (ref 135–145)

## 2016-01-17 LAB — BRAIN NATRIURETIC PEPTIDE: B Natriuretic Peptide: 31.1 pg/mL (ref 0.0–100.0)

## 2016-01-17 MED ORDER — IPRATROPIUM-ALBUTEROL 0.5-2.5 (3) MG/3ML IN SOLN
3.0000 mL | RESPIRATORY_TRACT | Status: AC
  Administered 2016-01-17 (×3): 3 mL via RESPIRATORY_TRACT
  Filled 2016-01-17: qty 9

## 2016-01-17 NOTE — ED Notes (Signed)
Patient transported to X-ray 

## 2016-01-17 NOTE — ED Notes (Signed)
Respiratory at bedside. Pt returned from [place].

## 2016-01-17 NOTE — Progress Notes (Signed)
Came to eval pt- pt in radiology per RN.

## 2016-01-17 NOTE — ED Notes (Signed)
Pt is in stable condition upon d/c and ambulates from ED. 

## 2016-01-17 NOTE — Progress Notes (Signed)
Subjective:     Patient ID: Frances Medina, female   DOB: [birth year], 55 y.o.   MRN: [id number]  HPI Frances Medina is a 55yo female presenting today for shortness of breath. Reports 2 week history of productive cough, shortness of breath, wheezing. Reports this is intermittent and occurs every time she gets up from resting. Episodes last approximately 20 minutes and resolved with rest. She is also been using Robitussin and her nebulizer treatments without relief. Denies fever, diaphoresis, rash. Wear CPAP at night and occasionally during the day. Former Smoker. Reports previous pulmonary problems have been caused by Myasthenia Gravis flares. Significant weight gain noted with weight of 240pounds in June 2017 and weight of 175 pounds today. Echocardiogram in February 2017 showed EF of [id number], normal systolic function, grade 1 diastolic dysfunction. Denies paroxysmal nocturnal dyspnea and orthopnea. Denies chest pain.  Review of Systems Per history of present illness.     Vitals:   01/17/16 0855  BP: 139/77  Pulse: (!) 104  Temp: 97.6 F (36.4 C)  -With ambulation pulse ox dropped to 87% heart rate increased to 125. O2 sat 97% at rest. -Weight 275 pounds  Objective:   Physical Exam  Constitutional: She appears well-developed and well-nourished. She appears distressed.  HENT:  Head: Normocephalic and atraumatic.  Cardiovascular: Regular rhythm.   No murmur heard. Tachycardia noted.  Pulmonary/Chest:  Increased work of breathing. Hard to speak in complete sentences. Crackles noted over left lung base.  Abdominal: Soft. She exhibits no distension.  Right CVA tenderness. No abdominal tenderness.  Musculoskeletal:  2+ pitting edema  Psychiatric: She has a normal mood and affect. Her behavior is normal.      Assessment and Plan:     Shortness of breath Etiology difficult to discern at this point. Differential includes pneumonia, heart failure exacerbation, myasthenia gravis flare, COPD.  Will go to emergency department for further workup. Recommend chest x-ray, BMP, CBC, BNP, EKG, troponin, and any additional workup recommended by the emergency medicine. Discussed she may require hospitalization if workup is abnormal. Was scheduled for sleep apnea test later today; contacted [facility] pulmonology to cancel appointment. Recommend pulmonary function test as outpatient following improvement of acute symptoms.

## 2016-01-17 NOTE — ED Triage Notes (Signed)
Pt sts increasing SOB x 2 weeks worse with exertion

## 2016-01-17 NOTE — ED Provider Notes (Addendum)
[facility] DEPT Provider Note   CSN: [id number] Arrival date & time: 01/17/16  [id number]     History   Chief Complaint Chief Complaint  Patient presents with  . Shortness of Breath    HPI Frances Medina is a 55 y.o. female.  The history is provided by the patient.  Shortness of Breath  This is a new problem. The average episode lasts 2 weeks. The problem occurs continuously.The problem has been gradually worsening. Associated symptoms include rhinorrhea, cough and sputum production. Pertinent negatives include no fever and no chest pain. She has tried oral steroids and beta-agonist inhalers for the symptoms. Associated medical issues do not include asthma, COPD, PE, CAD or DVT. Associated medical issues comments: myasthesis gravis.    Past Medical History:  Diagnosis Date  . Acute respiratory failure ([place])   . Acute respiratory failure with hypoxemia ([place])   . Aspiration into airway   . CAP (community acquired pneumonia) 08/05/2015  . Depression with anxiety   . Dysphagia 03/2015    EGD, Dr Carlean Purl. mild antral gastritis, ? due to Ibuprofen.  no stricture but empirically maloney dilated esophagus.   . E. coli UTI 04/07/2015  . Endotracheally intubated   . Enteritis due to Clostridium difficile   . Fibroid uterus    size of a dime  . Gastrostomy infection ([place]) 11/08/2015  . GERD (gastroesophageal reflux disease)    hiatal hernia  . Hypertension    "went away when I stopped smoking"  . Knee injury   . Migraine    "maybe couple times/month" (03/20/2015)  . Myasthenia gravis ([place]) 2017  . Myasthenia gravis with acute exacerbation ([place]) 05/15/2015  . Osteoarthritis of left knee 12/02/2013  . Osteoarthritis of right knee 08/30/2013  . Protein-calorie malnutrition, severe ([place]) 08/07/2015  . Seasonal allergies    takes Zytrec  . Tracheostomy status ([place])   . Tumors    "in my stomach"  . Umbilical hernia    watching , no plans for surgery at present  . VAP (ventilator-associated  pneumonia) [facility])     Patient Active Problem List   Diagnosis Date Noted  . Gastrostomy infection ([place]) 11/08/2015  . Esophageal dysmotility 11/08/2015  . Encounter for routine gynecological examination 10/19/2015  . Urine incontinence 10/19/2015  . Restrictive lung disease 10/11/2015  . Headache 09/14/2015  . Numbness and tingling 09/14/2015  . Headache, migraine   . HTN (hypertension), benign   . Shortness of breath   . Diplopia   . Dysphagia, neurologic   . Elevated LDH 05/06/2015  . Lymphadenopathy 04/07/2015  . Splenomegaly 04/07/2015  . Myasthenia gravis ([place])   . Osteoarthritis of left knee 12/02/2013  . Osteoarthritis of right knee 08/30/2013  . GERD (gastroesophageal reflux disease) 06/23/2013  . Obesity 06/23/2013  . Anxiety and depression 06/23/2013  . Fibroids 06/23/2013    Past Surgical History:  Procedure Laterality Date  . CESAREAN SECTION  1987; 1989  . COLONOSCOPY WITH PROPOFOL N/A 09/19/2013   Procedure: COLONOSCOPY WITH PROPOFOL;  Surgeon: Ladene Artist, MD;  Location: [facility] ENDOSCOPY;  Service: Endoscopy;  Laterality: N/A;  . DILATION AND CURETTAGE OF UTERUS    . ESOPHAGOGASTRODUODENOSCOPY N/A 08/10/2015   Procedure: ESOPHAGOGASTRODUODENOSCOPY (EGD);  Surgeon: Gatha Mayer, MD;  Location: [facility] ENDOSCOPY;  Service: Endoscopy;  Laterality: N/A;  . ESOPHAGOGASTRODUODENOSCOPY (EGD) WITH PROPOFOL N/A 03/21/2015   Procedure: ESOPHAGOGASTRODUODENOSCOPY (EGD) WITH PROPOFOL;  Surgeon: Gatha Mayer, MD;  Location: [facility];  Service: Endoscopy;  Laterality: N/A;  . PARTIAL  KNEE ARTHROPLASTY Right 08/30/2013   Procedure: RIGHT UNICOMPARTMENTAL KNEE;  Surgeon: Johnny Bridge, MD;  Location: [place];  Service: Orthopedics;  Laterality: Right;  . PARTIAL KNEE ARTHROPLASTY Left 12/02/2013   Procedure: LEFT KNEE UNI ARTHROPLASTY;  Surgeon: Johnny Bridge, MD;  Location: [place];  Service: Orthopedics;  Laterality: Left;  . PEG PLACEMENT N/A 08/10/2015    Procedure: PERCUTANEOUS ENDOSCOPIC GASTROSTOMY (PEG) PLACEMENT;  Surgeon: Gatha Mayer, MD;  Location: [place];  Service: Endoscopy;  Laterality: N/A;  . TUBAL LIGATION  1989  . VAGINAL HYSTERECTOMY  1990's?   "apparently took out one of my ovaries at the time too cause one's missing"    OB History    No data available       Home Medications    Prior to Admission medications   Medication Sig Start Date End Date Taking? Authorizing Provider  albuterol (PROAIR HFA) 108 (90 Base) MCG/ACT inhaler Inhale 2 puffs into the lungs every 4 (four) hours as needed for wheezing or shortness of breath. 11/20/15  Yes [name], MD  albuterol (PROVENTIL) (2.5 MG/3ML) 0.083% nebulizer solution Take 3 mLs (2.5 mg total) by nebulization every 6 (six) hours as needed for shortness of breath. 10/11/15  Yes [name], MD  clonazePAM (KLONOPIN) 1 MG tablet TAKE ONE-HALF TO ONE TABLET BY MOUTH TWICE DAILY AS NEEDED FOR ANXIETY 01/15/16  Yes [name], MD  ferrous sulfate 325 (65 FE) MG tablet Take 1 tablet (325 mg total) by mouth 2 (two) times daily with a meal. Patient taking differently: Take 325 mg by mouth daily.  04/07/15  Yes Shawn C Joy, PA-C  guaiFENesin (ROBITUSSIN) 100 MG/5ML SOLN Take 15 mLs by mouth daily as needed for cough or to loosen phlegm.   Yes Historical Provider, MD  ibuprofen (ADVIL,MOTRIN) 200 MG tablet Take 800 mg by mouth as needed for mild pain. Reported on 09/14/2015   Yes Historical Provider, MD  Menthol (COUGH DROPS) 10 MG LOZG Take 1 lozenge by mouth daily as needed (cough).   Yes Historical Provider, MD  omeprazole (PRILOSEC) 20 MG capsule Take 1 capsule (20 mg total) by mouth daily. 01/02/16  Yes [name], MD  predniSONE (DELTASONE) 10 MG tablet Take 4 tablets (40 mg total) by mouth daily. 10/10/15  Yes Fredia Sorrow, MD  pyridostigmine (MESTINON) 60 MG tablet Take 1 tablet (60 mg total) by mouth 4 (four) times daily. 06/08/15  Yes Nicolette Bang, DO  sertraline (ZOLOFT) 100 MG tablet Take 0.5 tablets (50 mg total) by mouth 2 (two) times daily. 12/21/15  Yes [name], MD  Amino Acids-Protein Hydrolys (FEEDING SUPPLEMENT, PRO-STAT SUGAR FREE 64,) LIQD Place 30 mLs into feeding tube 2 (two) times daily. Patient not taking: Reported on 01/17/2016 08/11/15   Rogue Bussing, MD  lactose free nutrition (BOOST PLUS) LIQD Take 237 mLs by mouth 3 (three) times daily with meals. Patient not taking: Reported on 01/17/2016 08/11/15   Katheren Shams, DO  Nutritional Supplements (FEEDING SUPPLEMENT, JEVITY 1.5 CAL/FIBER,) LIQD Place 275 mLs into feeding tube 4 (four) times daily. Patient not taking: Reported on 01/17/2016 08/11/15   Rogue Bussing, MD  nystatin cream (MYCOSTATIN) Apply 1 application topically 2 (two) times daily. Patient not taking: Reported on 01/17/2016 08/14/15   Leeanne Rio, MD  Nystatin POWD Apply to wound twice a day - G-tube site Patient not taking: Reported on 01/17/2016 10/24/15   Gatha Mayer,  MD  Vitamin D, Ergocalciferol, (DRISDOL) 50000 units CAPS capsule Take 1 capsule (50,000 Units total) by mouth every 7 (seven) days. Patient not taking: Reported on 01/17/2016 10/25/15   [name], MD    Family History Family History  Problem Relation Age of Onset  . Heart disease Mother 59  . Hypertension Mother   . Stroke Father   . Hypertension Father   . Other Sister     Esophageal strictures  . Anemia Sister   . Colon cancer Neg Hx     Social History Social History  Substance Use Topics  . Smoking status: Former Smoker    Packs/day: 0.50    Years: 38.00    Types: Cigarettes    Start date: 03/03/1974    Quit date: 05/14/2012  . Smokeless tobacco: Never Used     Comment: denies thoughts of restarting  . Alcohol use No     Allergies   Ciprofloxacin; Shrimp [shellfish allergy]; Dicyclomine; Methocarbamol; and Penicillins   Review of Systems Review of Systems    Constitutional: Negative for fever.  HENT: Positive for rhinorrhea.   Respiratory: Positive for cough, sputum production and shortness of breath.   Cardiovascular: Negative for chest pain.  Ten systems are reviewed and are negative for acute change except as noted in the HPI    Physical Exam Updated Vital Signs BP 141/82 (BP Location: Right Arm)   Pulse 91   Temp 97.6 F (36.4 C) (Oral)   Resp 18   SpO2 96%   Physical Exam  Constitutional: She is oriented to person, place, and time. She appears well-developed and well-nourished. No distress.  HENT:  Head: Normocephalic and atraumatic.  Nose: Nose normal.  Eyes: Conjunctivae and EOM are normal. Pupils are equal, round, and reactive to light. Right eye exhibits no discharge. Left eye exhibits no discharge. No scleral icterus.  Neck: Normal range of motion. Neck supple.  Cardiovascular: Normal rate and regular rhythm.  Exam reveals no gallop and no friction rub.   No murmur heard. Pulmonary/Chest: Effort normal and breath sounds normal. No stridor. Tachypnea noted. No respiratory distress. She has no rales.  Abdominal: Soft. She exhibits no distension. There is no tenderness.  Musculoskeletal: She exhibits no edema or tenderness.  Neurological: She is alert and oriented to person, place, and time.  Skin: Skin is warm and dry. No rash noted. She is not diaphoretic. No erythema.  Psychiatric: She has a normal mood and affect.  Vitals reviewed.    ED Treatments / Results  Labs (all labs ordered are listed, but only abnormal results are displayed) Labs Reviewed  BASIC METABOLIC PANEL - Abnormal; Notable for the following:       Result Value   Potassium 3.4 (*)    Glucose, Bld 112 (*)    All other components within normal limits  CBC WITH DIFFERENTIAL/PLATELET - Abnormal; Notable for the following:    WBC 13.2 (*)    RBC 5.12 (*)    Hemoglobin 11.6 (*)    MCV 72.5 (*)    MCH 22.7 (*)    RDW 17.5 (*)    Neutro Abs 11.7  (*)    All other components within normal limits  I-STAT CHEM 8, ED - Abnormal; Notable for the following:    Potassium 3.4 (*)    Glucose, Bld 107 (*)    Calcium, Ion 1.14 (*)    All other components within normal limits  BRAIN NATRIURETIC PEPTIDE    EKG  EKG  Interpretation  Date/Time:  Thursday January 17 2016 09:34:42 EST Ventricular Rate:  100 PR Interval:  162 QRS Duration: 68 QT Interval:  306 QTC Calculation: 394 R Axis:   65 Text Interpretation:  Sinus rhythm with Premature supraventricular complexes Right atrial enlargement Abnormal ECG Baseline wander No significant change since last tracing Confirmed by [facility] MD, [place] [phone]) on 01/17/2016 6:23:56 PM       Radiology Dg Chest 2 View  Result Date: 01/17/2016 CLINICAL DATA:  Shortness of breath EXAM: CHEST  2 VIEW COMPARISON:  11/23/2015 FINDINGS: The heart size and mediastinal contours are within normal limits. Bibasilar atelectasis noted. The visualized skeletal structures are unremarkable. IMPRESSION: Basilar atelectasis. Electronically Signed   By: Kerby Moors M.D.   On: 01/17/2016 10:31    Procedures Procedures (including critical care time)  Medications Ordered in ED Medications  ipratropium-albuterol (DUONEB) 0.5-2.5 (3) MG/3ML nebulizer solution 3 mL (3 mLs Nebulization Given 01/17/16 1058)     Initial Impression / Assessment and Plan / ED Course  I have reviewed the triage vital signs and the nursing notes.  Pertinent labs & imaging results that were available during my care of the patient were reviewed by me and considered in my medical decision making (see chart for details).  Clinical Course     Negative respiratory force within normal limits. No evidence suggesting myasthenia gravis exacerbation. Leukocytosis secondary to emargination from chronic prednisone use. No evidence of pneumonia, pulmonary edema, pleural effusions on chest x-ray. Following breathing treatment patient was able to  cough up a significant amount of sputum which has significant improvement in her shortness of breath and symptomatology. Low suspicion for pulmonary embolism.  Patient safe for discharge with strict return precautions.    Final Clinical Impressions(s) / ED Diagnoses   Final diagnoses:  SOB (shortness of breath)  Bronchitis   Disposition: Discharge  Condition: Good  I have discussed the results, Dx and Tx plan with the patient who expressed understanding and agree(s) with the plan. Discharge instructions discussed at great length. The patient was given strict return precautions who verbalized understanding of the instructions. No further questions at time of discharge.    Discharge Medication List as of 01/17/2016  1:24 PM      Follow Up: [name], MD [place] [id number] [phone]  Schedule an appointment as soon as possible for a visit  in 3-5 days, If symptoms do not improve or  worsen        Fatima Blank, MD 01/17/16 [phone]

## 2016-01-17 NOTE — Assessment & Plan Note (Addendum)
Etiology difficult to discern at this point. Differential includes pneumonia, heart failure exacerbation, myasthenia gravis flare, COPD. Will go to emergency department for further workup. Recommend chest x-ray, BMP, CBC, BNP, EKG, troponin, and any additional workup recommended by the emergency medicine. Discussed she may require hospitalization if workup is abnormal. Was scheduled for sleep apnea test later today; contacted [facility] pulmonology to cancel appointment. Recommend pulmonary function test as outpatient following improvement of acute symptoms.

## 2016-01-17 NOTE — Patient Instructions (Signed)
Please go to ED for further workup °

## 2016-01-17 NOTE — ED Notes (Signed)
Called respiratory to do Neg inspiratory force and vital capacity and duoneb.

## 2016-01-17 NOTE — Progress Notes (Signed)
NIF > -60 x3 efforts VC 1.2 L x 2 efforts

## 2016-02-27 ENCOUNTER — Encounter (HOSPITAL_COMMUNITY): Payer: Self-pay

## 2016-02-27 ENCOUNTER — Emergency Department (HOSPITAL_COMMUNITY)
Admission: EM | Admit: 2016-02-27 | Discharge: 2016-02-27 | Disposition: A | Discharge status: Home / Self Care | Payer: BC Managed Care – PPO | Attending: Emergency Medicine | Admitting: Emergency Medicine

## 2016-02-27 ENCOUNTER — Emergency Department (HOSPITAL_COMMUNITY): Payer: BC Managed Care – PPO

## 2016-02-27 DIAGNOSIS — Z79899 Other long term (current) drug therapy: Secondary | ICD-10-CM | POA: Diagnosis not present

## 2016-02-27 DIAGNOSIS — I10 Essential (primary) hypertension: Secondary | ICD-10-CM | POA: Insufficient documentation

## 2016-02-27 DIAGNOSIS — Z87891 Personal history of nicotine dependence: Secondary | ICD-10-CM | POA: Insufficient documentation

## 2016-02-27 DIAGNOSIS — R0602 Shortness of breath: Secondary | ICD-10-CM | POA: Diagnosis present

## 2016-02-27 DIAGNOSIS — J40 Bronchitis, not specified as acute or chronic: Secondary | ICD-10-CM | POA: Diagnosis not present

## 2016-02-27 DIAGNOSIS — G7000 Myasthenia gravis without (acute) exacerbation: Secondary | ICD-10-CM | POA: Diagnosis not present

## 2016-02-27 LAB — BASIC METABOLIC PANEL
Anion gap: 6 (ref 5–15)
BUN: 13 mg/dL (ref 6–20)
CO2: 30 mmol/L (ref 22–32)
Calcium: 9.2 mg/dL (ref 8.9–10.3)
Chloride: 105 mmol/L (ref 101–111)
Creatinine, Ser: 0.63 mg/dL (ref 0.44–1.00)
GFR calc Af Amer: >60 mL/min (ref >60)
GFR calc non Af Amer: >60 mL/min (ref >60)
Glucose, Bld: 95 mg/dL (ref 65–99)
Potassium: 3.2 mmol/L — ABNORMAL LOW (ref 3.5–5.1)
Sodium: 141 mmol/L (ref 135–145)

## 2016-02-27 LAB — CBC
HCT: 36.9 % (ref 36.0–46.0)
Hemoglobin: 11.2 g/dL — ABNORMAL LOW (ref 12.0–15.0)
MCH: 21.8 pg — ABNORMAL LOW (ref 26.0–34.0)
MCHC: 30.4 g/dL (ref 30.0–36.0)
MCV: 71.8 fL — ABNORMAL LOW (ref 78.0–100.0)
Platelets: 284 10*3/uL (ref 150–400)
RBC: 5.14 MIL/uL — ABNORMAL HIGH (ref 3.87–5.11)
RDW: 17.5 % — ABNORMAL HIGH (ref 11.5–15.5)
WBC: 12.3 10*3/uL — ABNORMAL HIGH (ref 4.0–10.5)

## 2016-02-27 LAB — I-STAT TROPONIN, ED: Troponin i, poc: 0 ng/mL (ref 0.00–0.08)

## 2016-02-27 LAB — BRAIN NATRIURETIC PEPTIDE: B Natriuretic Peptide: 29 pg/mL (ref 0.0–100.0)

## 2016-02-27 MED ORDER — ALBUTEROL SULFATE (2.5 MG/3ML) 0.083% IN NEBU
2.5000 mg | INHALATION_SOLUTION | Freq: Once | RESPIRATORY_TRACT | Status: AC
  Administered 2016-02-27: 2.5 mg via RESPIRATORY_TRACT
  Filled 2016-02-27: qty 3

## 2016-02-27 NOTE — ED Triage Notes (Signed)
Pt presents for evaluation of sob starting last weekend. Pt. Reports using multiple breathing txt at home and inhalers with no improvement. Pt reports CP and ribcage pain. Pt. Reports having feed tube as well. Pt reports she feels her breathing declines when doing tube feeds.

## 2016-02-27 NOTE — ED Notes (Signed)
EDP at bedside  

## 2016-02-27 NOTE — ED Provider Notes (Signed)
[facility] DEPT Provider Note   CSN: [id number] Arrival date & time: 02/27/16  [id number]     History   Chief Complaint Chief Complaint  Patient presents with  . Shortness of Breath    HPI Frances Medina is a 55 y.o. female.  HPI Patient has felt increasingly short of breath for about a week. Much worse with exertion. Patient poor she has also been coughing. Minimal clear sputum. She does report aching under both breasts with coughing. Patient uses nighttime C Pap. She reports she never sleeps flat. Persistent mild shortness of breath at rest as well. No nausea vomiting or diarrhea. No lower extremity swelling or calf pain. No history of PE. History of myasthenia gravis. Patient reports she is compliant with her medications. She reports one medication was discontinued at the beginning of December due to leg cramping. She is currently taking pyridostigmine and mycophenolate and daily prednisone. Past Medical History:  Diagnosis Date  . Acute respiratory failure ([place])   . Acute respiratory failure with hypoxemia ([place])   . Aspiration into airway   . CAP (community acquired pneumonia) 08/05/2015  . Depression with anxiety   . Dysphagia 03/2015    EGD, Dr Carlean Purl. mild antral gastritis, ? due to Ibuprofen.  no stricture but empirically maloney dilated esophagus.   . E. coli UTI 04/07/2015  . Endotracheally intubated   . Enteritis due to Clostridium difficile   . Fibroid uterus    size of a dime  . Gastrostomy infection ([place]) 11/08/2015  . GERD (gastroesophageal reflux disease)    hiatal hernia  . Hypertension    "went away when I stopped smoking"  . Knee injury   . Migraine    "maybe couple times/month" (03/20/2015)  . Myasthenia gravis ([place]) 2017  . Myasthenia gravis with acute exacerbation ([place]) 05/15/2015  . Osteoarthritis of left knee 12/02/2013  . Osteoarthritis of right knee 08/30/2013  . Protein-calorie malnutrition, severe ([place]) 08/07/2015  . Seasonal allergies    takes Zytrec    . Tracheostomy status ([place])   . Tumors    "in my stomach"  . Umbilical hernia    watching , no plans for surgery at present  . VAP (ventilator-associated pneumonia) [facility])     Patient Active Problem List   Diagnosis Date Noted  . Gastrostomy infection ([place]) 11/08/2015  . Esophageal dysmotility 11/08/2015  . Encounter for routine gynecological examination 10/19/2015  . Urine incontinence 10/19/2015  . Restrictive lung disease 10/11/2015  . Headache 09/14/2015  . Numbness and tingling 09/14/2015  . Headache, migraine   . HTN (hypertension), benign   . Shortness of breath   . Diplopia   . Dysphagia, neurologic   . Elevated LDH 05/06/2015  . Lymphadenopathy 04/07/2015  . Splenomegaly 04/07/2015  . Myasthenia gravis ([place])   . Osteoarthritis of left knee 12/02/2013  . Osteoarthritis of right knee 08/30/2013  . GERD (gastroesophageal reflux disease) 06/23/2013  . Obesity 06/23/2013  . Anxiety and depression 06/23/2013  . Fibroids 06/23/2013    Past Surgical History:  Procedure Laterality Date  . CESAREAN SECTION  1987; 1989  . COLONOSCOPY WITH PROPOFOL N/A 09/19/2013   Procedure: COLONOSCOPY WITH PROPOFOL;  Surgeon: Ladene Artist, MD;  Location: [facility] ENDOSCOPY;  Service: Endoscopy;  Laterality: N/A;  . DILATION AND CURETTAGE OF UTERUS    . ESOPHAGOGASTRODUODENOSCOPY N/A 08/10/2015   Procedure: ESOPHAGOGASTRODUODENOSCOPY (EGD);  Surgeon: Gatha Mayer, MD;  Location: [facility] ENDOSCOPY;  Service: Endoscopy;  Laterality: N/A;  . ESOPHAGOGASTRODUODENOSCOPY (EGD) WITH PROPOFOL  N/A 03/21/2015   Procedure: ESOPHAGOGASTRODUODENOSCOPY (EGD) WITH PROPOFOL;  Surgeon: Gatha Mayer, MD;  Location: [facility];  Service: Endoscopy;  Laterality: N/A;  . PARTIAL KNEE ARTHROPLASTY Right 08/30/2013   Procedure: RIGHT UNICOMPARTMENTAL KNEE;  Surgeon: Johnny Bridge, MD;  Location: [facility];  Service: Orthopedics;  Laterality: Right;  . PARTIAL KNEE ARTHROPLASTY Left 12/02/2013   Procedure: LEFT KNEE UNI  ARTHROPLASTY;  Surgeon: Johnny Bridge, MD;  Location: [place] [facility];  Service: Orthopedics;  Laterality: Left;  . PEG PLACEMENT N/A 08/10/2015   Procedure: PERCUTANEOUS ENDOSCOPIC GASTROSTOMY (PEG) PLACEMENT;  Surgeon: Gatha Mayer, MD;  Location: [facility];  Service: Endoscopy;  Laterality: N/A;  . TUBAL LIGATION  1989  . VAGINAL HYSTERECTOMY  1990's?   "apparently took out one of my ovaries at the time too cause one's missing"    OB History    No data available       Home Medications    Prior to Admission medications   Medication Sig Start Date End Date Taking? Authorizing Provider  albuterol (PROAIR HFA) 108 (90 Base) MCG/ACT inhaler Inhale 2 puffs into the lungs every 4 (four) hours as needed for wheezing or shortness of breath. 11/20/15   [name], MD  albuterol (PROVENTIL) (2.5 MG/3ML) 0.083% nebulizer solution Take 3 mLs (2.5 mg total) by nebulization every 6 (six) hours as needed for shortness of breath. 10/11/15   [name], MD  Amino Acids-Protein Hydrolys (FEEDING SUPPLEMENT, PRO-STAT SUGAR FREE 64,) LIQD Place 30 mLs into feeding tube 2 (two) times daily. Patient not taking: Reported on 01/17/2016 08/11/15   Rogue Bussing, MD  clonazePAM (KLONOPIN) 1 MG tablet TAKE ONE-HALF TO ONE TABLET BY MOUTH TWICE DAILY AS NEEDED FOR ANXIETY 01/15/16   [name], MD  ferrous sulfate 325 (65 FE) MG tablet Take 1 tablet (325 mg total) by mouth 2 (two) times daily with a meal. Patient taking differently: Take 325 mg by mouth daily.  04/07/15   Shawn C Joy, PA-C  guaiFENesin (ROBITUSSIN) 100 MG/5ML SOLN Take 15 mLs by mouth daily as needed for cough or to loosen phlegm.    Historical Provider, MD  ibuprofen (ADVIL,MOTRIN) 200 MG tablet Take 800 mg by mouth as needed for mild pain. Reported on 09/14/2015    Historical Provider, MD  lactose free nutrition (BOOST PLUS) LIQD Take 237 mLs by mouth 3 (three) times daily with meals. Patient not taking:  Reported on 01/17/2016 08/11/15   Katheren Shams, DO  Menthol (COUGH DROPS) 10 MG LOZG Take 1 lozenge by mouth daily as needed (cough).    Historical Provider, MD  Nutritional Supplements (FEEDING SUPPLEMENT, JEVITY 1.5 CAL/FIBER,) LIQD Place 275 mLs into feeding tube 4 (four) times daily. Patient not taking: Reported on 01/17/2016 08/11/15   Rogue Bussing, MD  nystatin cream (MYCOSTATIN) Apply 1 application topically 2 (two) times daily. Patient not taking: Reported on 01/17/2016 08/14/15   Leeanne Rio, MD  Nystatin POWD Apply to wound twice a day - G-tube site Patient not taking: Reported on 01/17/2016 10/24/15   Gatha Mayer, MD  omeprazole (PRILOSEC) 20 MG capsule Take 1 capsule (20 mg total) by mouth daily. 01/02/16   [name], MD  predniSONE (DELTASONE) 10 MG tablet Take 4 tablets (40 mg total) by mouth daily. 10/10/15   Fredia Sorrow, MD  pyridostigmine (MESTINON) 60 MG tablet Take 1 tablet (60 mg total) by mouth 4 (four) times daily. 06/08/15  Nicolette Bang, DO  sertraline (ZOLOFT) 100 MG tablet Take 0.5 tablets (50 mg total) by mouth 2 (two) times daily. 12/21/15   [name], MD  Vitamin D, Ergocalciferol, (DRISDOL) 50000 units CAPS capsule Take 1 capsule (50,000 Units total) by mouth every 7 (seven) days. Patient not taking: Reported on 01/17/2016 10/25/15   [name], MD    Family History Family History  Problem Relation Age of Onset  . Heart disease Mother 24  . Hypertension Mother   . Stroke Father   . Hypertension Father   . Other Sister     Esophageal strictures  . Anemia Sister   . Colon cancer Neg Hx     Social History Social History  Substance Use Topics  . Smoking status: Former Smoker    Packs/day: 0.50    Years: 38.00    Types: Cigarettes    Start date: 03/03/1974    Quit date: 05/14/2012  . Smokeless tobacco: Never Used     Comment: denies thoughts of restarting  . Alcohol use No     Allergies     Ciprofloxacin; Shrimp [shellfish allergy]; Dicyclomine; Methocarbamol; and Penicillins   Review of Systems Review of Systems 10 Systems reviewed and are negative for acute change except as noted in the HPI.   Physical Exam Updated Vital Signs BP 154/85 (BP Location: Left Arm)   Pulse 101   Temp 98 F (36.7 C) (Oral)   Resp 24   Ht 5\' 4"  (1.626 m)   Wt 274 lb (124.3 kg)   SpO2 98%   BMI 47.03 kg/m   Physical Exam  Constitutional: She is oriented to person, place, and time.  Patient is alert and nontoxic. Mild tachypnea. Speaking in full sentences. Mental status clear. Color good. Patient is morbidly obese.  HENT:  Head: Normocephalic and atraumatic.  Mouth/Throat: Oropharynx is clear and moist.  Eyes: EOM are normal. Pupils are equal, round, and reactive to light.  Neck: Neck supple.  Cardiovascular: Normal rate, regular rhythm, normal heart sounds and intact distal pulses.   Pulmonary/Chest: Effort normal and breath sounds normal. She exhibits tenderness.  Bilateral lower thoracic wall tender to compression.  Abdominal: Soft. There is no tenderness. There is no guarding.  Musculoskeletal: Normal range of motion. She exhibits edema. She exhibits no tenderness.  Trace pitting edema at ankles. Calves are soft and nontender.  Neurological: She is alert and oriented to person, place, and time. She exhibits normal muscle tone. Coordination normal.  Skin: Skin is warm and dry.  Psychiatric: She has a normal mood and affect.     ED Treatments / Results  Labs (all labs ordered are listed, but only abnormal results are displayed) Labs Reviewed  BASIC METABOLIC PANEL - Abnormal; Notable for the following:       Result Value   Potassium 3.2 (*)    All other components within normal limits  CBC - Abnormal; Notable for the following:    WBC 12.3 (*)    RBC 5.14 (*)    Hemoglobin 11.2 (*)    MCV 71.8 (*)    MCH 21.8 (*)    RDW 17.5 (*)    All other components within normal  limits  BRAIN NATRIURETIC PEPTIDE  I-STAT TROPOININ, ED    EKG  EKG Interpretation  Date/Time:  Wednesday February 27 2016 08:33:13 EST Ventricular Rate:  90 PR Interval:  154 QRS Duration: 76 QT Interval:  354 QTC Calculation: 433 R Axis:   66  Text Interpretation:  Sinus rhythm with Premature supraventricular complexes Nonspecific ST abnormality Abnormal ECG no acute ischemic appearance. no sig change compared with old. Confirmed by Johnney Killian, [place], Jeannie Done [phone]) on 02/27/2016 10:23:45 AM       Radiology Dg Chest 2 View  Result Date: 02/27/2016 CLINICAL DATA:  Pt c/o SOB increasing after exertion; right sided chest pain radiating around to back; symptoms worsening X1 week; hx HTN, ex-smoker EXAM: CHEST  2 VIEW COMPARISON:  01/17/2016 FINDINGS: Cardiac silhouette is normal in size. No mediastinal or hilar masses or convincing adenopathy. There is linear opacity at both lung bases stable from the prior study, most likely atelectasis. Lungs are otherwise clear. No pleural effusion.  No pneumothorax. Skeletal structures are intact. IMPRESSION: 1. No acute cardiopulmonary disease. 2. Persistent lung base opacities consistent with atelectasis. Electronically Signed   By: Lajean Manes M.D.   On: 02/27/2016 08:54    Procedures Procedures (including critical care time)  Medications Ordered in ED Medications  albuterol (PROVENTIL) (2.5 MG/3ML) 0.083% nebulizer solution 2.5 mg (2.5 mg Nebulization Given 02/27/16 0941)     Initial Impression / Assessment and Plan / ED Course  I have reviewed the triage vital signs and the nursing notes.  Pertinent labs & imaging results that were available during my care of the patient were reviewed by me and considered in my medical decision making (see chart for details).  Clinical Course     Final Clinical Impressions(s) / ED Diagnoses   Final diagnoses:  Bronchitis  Myasthenia gravis ([place])  Patient's general condition appears stable. She is  not exhibiting muscular weakness. She has had persistent cough and aching bilateral lower ribs. Findings most consistent with bronchitis. Air flow is good on auscultation.CXR shows some atelectasis but no evidence of consolidation, no leukocytosis and no fevers. At this time, I do feel patient is appropriate for continued outpatient management. She reports she gets relief from coughing from over-the-counter Robitussin and has home albuterol to use. We will continue with this management with close PCP follow-up. Patient is aware of the necessity to return should she be developing any signs of weakness increasing respiratory distress or other concerning symptoms.  New Prescriptions New Prescriptions   No medications on file     Charlesetta Shanks, MD 02/27/16 1038

## 2016-02-28 NOTE — Progress Notes (Signed)
   Subjective:    Patient ID: Frances Medina, female    DOB: 07-Jun-1960, 55 y.o.   MRN: [id number]  HPI 55 y/o female presents for ED follow up.  Evaluated at [facility] ED on 02/27/16 for shortness of breath/cough. Patient has a history of Myasthenia Gravis with acute respiratory failure. CXR showed stable linear opacity at bilateral lung bases. BNP 29. Trop neg. WBC mildly elevated to 12.3. K 3.2. Diagnosed with bronchitis. Counseled to continue Robitussin and prn Albuterol. Patient currently taking Mestinon (Pyridostigmine) 60 mg QID and Cellcept 1000 mg BID and Prednisone 10 mg daily.   Symptoms present for a few weeks, continues to have dry cough, no sputum, does use CPAP at night, uses albuterol 3-4 times per day, does provide some relief of symptoms, also taking otc Robitussin.    MG - seen by Neurology on 12/5, continue prednisone and Mestinon, start Cellcept, referred to ENT and Pulmonology   Review of Systems  Constitutional: Negative for chills and fatigue.  Respiratory: Positive for cough and shortness of breath.   Gastrointestinal: Negative for abdominal pain, constipation, diarrhea and nausea.  Some sob with laying down at night Edema/weight gain associated with prednisone     Objective:   Physical Exam BP 128/78   Pulse 88   Temp 97.9 F (36.6 C) (Oral)   Wt 285 lb 3.2 oz (129.4 kg)   SpO2 94%   BMI 48.95 kg/m   Gen: pleasant female, NAD, taking in full sentences, no cough during exam HEENT: normocephalic, PERRL, EOMI, no scleral icterus, no rhinorrhea, MMM, no pharyngeal erythema or exudate, neck supple, no adenopathy Cardiac: RRR, S1 and S2 present, no murmur Resp: CTAB, no increased work of breathing Abd: G-tube present, bilateral upper abdomen/rib tenderness, normal bowel sounds Ext: 1+ edema in both legs to mid-shin  Labs 02/27/16 HBG 11.2 K 3.2 Trop neg  TSH (11/2015)[facility] (normal) LFT (11/2015) [facility] normal Renal function normal 11/2015  Echo 04/2015 EF  123456 Grade 1 Diastolic Dysfunction    Assessment & Plan:  Acute bronchitis Patient presents for ED follow up of acute bronchitis. Patient still symptomatic. Examination and workup (ED) not consistent with CHF/COPD/PNA.  -continue prn albuterol -continue otc Robitussin  Myasthenia gravis ([place]) Current respiratory symptoms are likely not due to MG flare.  -continue Mestinon, Cellcept, and Prednisone per [facility]  Hypokalemia Potassium 3.2 on most recent check. As starting lasix today will start Klor-con 20 mg daily.  -recheck K in one week  Leg swelling Leg swelling of bilateral legs. Clinically consistent with venous insufficiency and effect of chronic steroids. No anemia. Thyroid tests normal. Renal and Liver function normal. Recent BNP normal suggesting against CHF.  -continue compression stockings -trial of lasix 20 mg daily

## 2016-02-29 ENCOUNTER — Encounter: Payer: Self-pay | Admitting: Family Medicine

## 2016-02-29 ENCOUNTER — Ambulatory Visit (INDEPENDENT_AMBULATORY_CARE_PROVIDER_SITE_OTHER): Payer: BC Managed Care – PPO | Admitting: Family Medicine

## 2016-02-29 DIAGNOSIS — M7989 Other specified soft tissue disorders: Secondary | ICD-10-CM

## 2016-02-29 DIAGNOSIS — J209 Acute bronchitis, unspecified: Secondary | ICD-10-CM | POA: Diagnosis not present

## 2016-02-29 DIAGNOSIS — G7000 Myasthenia gravis without (acute) exacerbation: Secondary | ICD-10-CM

## 2016-02-29 DIAGNOSIS — E876 Hypokalemia: Secondary | ICD-10-CM

## 2016-02-29 MED ORDER — FUROSEMIDE 20 MG PO TABS: 20.0000 mg | ORAL_TABLET | Freq: Every day | ORAL | 3 refills | Status: DC

## 2016-02-29 MED ORDER — POTASSIUM CHLORIDE 20 MEQ PO PACK: 20.0000 meq | PACK | Freq: Every day | ORAL | 2 refills | Status: DC

## 2016-02-29 MED ORDER — PREDNISONE 10 MG PO TABS: 10.0000 mg | ORAL_TABLET | Freq: Every day | ORAL | 0 refills | Status: DC

## 2016-02-29 NOTE — Assessment & Plan Note (Signed)
Leg swelling of bilateral legs. Clinically consistent with venous insufficiency and effect of chronic steroids. No anemia. Thyroid tests normal. Renal and Liver function normal. Recent BNP normal suggesting against CHF.  -continue compression stockings -trial of lasix 20 mg daily

## 2016-02-29 NOTE — Assessment & Plan Note (Signed)
Potassium 3.2 on most recent check. As starting lasix today will start Klor-con 20 mg daily.  -recheck K in one week

## 2016-02-29 NOTE — Assessment & Plan Note (Signed)
Current respiratory symptoms are likely not due to MG flare.  -continue Mestinon, Cellcept, and Prednisone per [facility]

## 2016-02-29 NOTE — Patient Instructions (Signed)
It was nice to see you today.  Cough/shortness of breath - likely due to a virus/bronchitis, continue albuterol as needed, continue Robitussin as needed  Leg swelling - likely due to steroids, start lasix 20 mg daily  Low Potassium - start potassium 20 mEq daily  Return in one week to see Dr. Gwendlyn Deutscher. Check labs at that time as well.

## 2016-02-29 NOTE — Assessment & Plan Note (Signed)
Patient presents for ED follow up of acute bronchitis. Patient still symptomatic. Examination and workup (ED) not consistent with CHF/COPD/PNA.  -continue prn albuterol -continue otc Robitussin

## 2016-03-12 ENCOUNTER — Telehealth: Payer: Self-pay | Admitting: Family Medicine

## 2016-03-12 DIAGNOSIS — G7000 Myasthenia gravis without (acute) exacerbation: Secondary | ICD-10-CM

## 2016-03-12 NOTE — Telephone Encounter (Signed)
Patient's daughter came with a script from her mom written by Dr Merlene Laughter for referral to pulmonary rehab. Referral placed.

## 2016-03-24 ENCOUNTER — Other Ambulatory Visit: Payer: Self-pay | Admitting: *Deleted

## 2016-03-25 MED ORDER — CLONAZEPAM 1 MG PO TABS: 1.0000 mg | ORAL_TABLET | Freq: Two times a day (BID) | ORAL | 2 refills | Status: DC | PRN

## 2016-03-25 MED ORDER — SERTRALINE HCL 100 MG PO TABS: 100.0000 mg | ORAL_TABLET | Freq: Every day | ORAL | 2 refills | Status: DC

## 2016-03-25 NOTE — Telephone Encounter (Signed)
Both medication was called in to her pharmacy on file.

## 2016-03-25 NOTE — Telephone Encounter (Signed)
Prescription  Called in to the pharmacy

## 2016-03-27 ENCOUNTER — Encounter: Payer: Self-pay | Admitting: *Deleted

## 2016-03-27 ENCOUNTER — Telehealth: Payer: Self-pay | Admitting: Internal Medicine

## 2016-03-27 NOTE — Telephone Encounter (Signed)
Patient c/o abdominal pain with meals.  She is not using her feeding tube much and is also having some bleeding at the insertion site. She will come in tomorrow and see Nicoletta Ba PA

## 2016-03-28 ENCOUNTER — Other Ambulatory Visit (INDEPENDENT_AMBULATORY_CARE_PROVIDER_SITE_OTHER): Payer: BC Managed Care – PPO

## 2016-03-28 ENCOUNTER — Ambulatory Visit (INDEPENDENT_AMBULATORY_CARE_PROVIDER_SITE_OTHER): Payer: BC Managed Care – PPO | Admitting: Physician Assistant

## 2016-03-28 ENCOUNTER — Encounter: Payer: Self-pay | Admitting: Physician Assistant

## 2016-03-28 VITALS — BP 102/54 | HR 88 | Ht 60.0 in | Wt 290.4 lb

## 2016-03-28 DIAGNOSIS — Z931 Gastrostomy status: Secondary | ICD-10-CM | POA: Diagnosis not present

## 2016-03-28 DIAGNOSIS — R1013 Epigastric pain: Secondary | ICD-10-CM | POA: Diagnosis not present

## 2016-03-28 LAB — CBC WITH DIFFERENTIAL/PLATELET
Basophils Absolute: 0 10*3/uL (ref 0.0–0.1)
Basophils Relative: 0.2 % (ref 0.0–3.0)
Eosinophils Absolute: 0 10*3/uL (ref 0.0–0.7)
Eosinophils Relative: 0 % (ref 0.0–5.0)
HCT: 38.5 % (ref 36.0–46.0)
Hemoglobin: 12.4 g/dL (ref 12.0–15.0)
Lymphocytes Relative: 10 % — ABNORMAL LOW (ref 12.0–46.0)
Lymphs Abs: 1.5 10*3/uL (ref 0.7–4.0)
MCHC: 32.2 g/dL (ref 30.0–36.0)
MCV: 69.2 fl — ABNORMAL LOW (ref 78.0–100.0)
Monocytes Absolute: 0.4 10*3/uL (ref 0.1–1.0)
Monocytes Relative: 2.8 % — ABNORMAL LOW (ref 3.0–12.0)
Neutro Abs: 12.8 10*3/uL — ABNORMAL HIGH (ref 1.4–7.7)
Neutrophils Relative %: 87 % — ABNORMAL HIGH (ref 43.0–77.0)
Platelets: 304 10*3/uL (ref 150.0–400.0)
RBC: 5.57 Mil/uL — ABNORMAL HIGH (ref 3.87–5.11)
RDW: 19.3 % — ABNORMAL HIGH (ref 11.5–15.5)
WBC: 14.7 10*3/uL — ABNORMAL HIGH (ref 4.0–10.5)

## 2016-03-28 LAB — COMPREHENSIVE METABOLIC PANEL
ALT: 11 U/L (ref 0–35)
AST: 11 U/L (ref 0–37)
Albumin: 4.2 g/dL (ref 3.5–5.2)
Alkaline Phosphatase: 85 U/L (ref 39–117)
BUN: 14 mg/dL (ref 6–23)
CO2: 33 mEq/L — ABNORMAL HIGH (ref 19–32)
Calcium: 9.4 mg/dL (ref 8.4–10.5)
Chloride: 103 mEq/L (ref 96–112)
Creatinine, Ser: 0.75 mg/dL (ref 0.40–1.20)
GFR: 103.23 mL/min (ref >60.00)
Glucose, Bld: 94 mg/dL (ref 70–99)
Potassium: 4.5 mEq/L (ref 3.5–5.1)
Sodium: 142 mEq/L (ref 135–145)
Total Bilirubin: 0.2 mg/dL (ref 0.2–1.2)
Total Protein: 7.6 g/dL (ref 6.0–8.3)

## 2016-03-28 LAB — LIPASE: Lipase: 41 U/L (ref 11.0–59.0)

## 2016-03-28 NOTE — Progress Notes (Addendum)
Subjective:    Patient ID: Frances Medina, female    DOB: Jun 05, 1960, 56 y.o.   MRN: [id number]  HPI Frances "Sunday Spillers" is a 56 year old female known to Dr. Carlean Purl with history of myasthenia gravis and neurogenic dysphagia who had undergone PEG placement in June 2017. She describes an exacerbation of her myasthenia gravis which led to respiratory compromise and trach placement and then subsequent dysphagia. Patient also has history of hypertension and morbid obesity with BMI of 56. She says she has completely recovered her swallowing function and is eating whatever she wants without any difficulty. She denies any difficulty swallowing solids, liquids or pills. She has not been using the PEG tube regularly over the past several months but continues to flush it. She was last seen in the office in September at which time she had had some superficial PEG infection. She has undergone evaluation with ENT at [facility] and says she is scheduled for a procedure. In early February which she describes as a "flap" in her throat. Records were reviewed and she has a scar band traversing her trachea and is planned for a laryngoscopic excision. Patient comes in today stating that she's developed acute abdominal discomfort over the past 5 days area she says this is worse when she eats. She also feels that the PEG tube itself has been more irritated and she's uncomfortable to the left of the PEG tube. She generally is more uncomfortable with larger meals. She has not had any vomiting, no fever or chills, no diarrhea or changes in bowel habits. She has been taking Motrin on a regular basis once or twice daily, has been on omeprazole 20 mg daily.  Review of Systems Pertinent positive and negative review of systems were noted in the above HPI section.  All other review of systems was otherwise negative.  Outpatient Encounter Prescriptions as of 03/28/2016  Medication Sig  . albuterol (PROAIR HFA) 108 (90 Base) MCG/ACT  inhaler Inhale 2 puffs into the lungs every 4 (four) hours as needed for wheezing or shortness of breath.  [name] albuterol (PROVENTIL) (2.5 MG/3ML) 0.083% nebulizer solution Take 3 mLs (2.5 mg total) by nebulization every 6 (six) hours as needed for shortness of breath.  . Amino Acids-Protein Hydrolys (FEEDING SUPPLEMENT, PRO-STAT SUGAR FREE 64,) LIQD Place 30 mLs into feeding tube 2 (two) times daily.  . clonazePAM (KLONOPIN) 1 MG tablet Take 1 tablet (1 mg total) by mouth 2 (two) times daily as needed for anxiety.  . ferrous sulfate 325 (65 FE) MG tablet Take 1 tablet (325 mg total) by mouth 2 (two) times daily with a meal. (Patient taking differently: Take 325 mg by mouth daily. )  . furosemide (LASIX) 20 MG tablet Take 1 tablet (20 mg total) by mouth daily.  [name] guaiFENesin (ROBITUSSIN) 100 MG/5ML SOLN Take 15 mLs by mouth daily as needed for cough or to loosen phlegm.  [name] ibuprofen (ADVIL,MOTRIN) 200 MG tablet Take 800 mg by mouth as needed for mild pain. Reported on 09/14/2015  . lactose free nutrition (BOOST PLUS) LIQD Take 237 mLs by mouth 3 (three) times daily with meals.  . Menthol (COUGH DROPS) 10 MG LOZG Take 1 lozenge by mouth daily as needed (cough).  . mycophenolate (CELLCEPT) 500 MG tablet Take 1,000 mg by mouth 2 (two) times daily.   . Nutritional Supplements (FEEDING SUPPLEMENT, JEVITY 1.5 CAL/FIBER,) LIQD Place 275 mLs into feeding tube 4 (four) times daily.  [name] nystatin cream (MYCOSTATIN) Apply 1 application topically 2 (  two) times daily.  [name] Nystatin POWD Apply to wound twice a day - G-tube site  . omeprazole (PRILOSEC) 20 MG capsule Take 1 capsule (20 mg total) by mouth daily.  . potassium chloride (KLOR-CON) 20 MEQ packet Take 20 mEq by mouth daily.  . predniSONE (DELTASONE) 10 MG tablet Take 1 tablet (10 mg total) by mouth daily with breakfast.  . pyridostigmine (MESTINON) 60 MG tablet Take 1 tablet (60 mg total) by mouth 4 (four) times daily.  . sertraline (ZOLOFT) 100 MG tablet Take  1 tablet (100 mg total) by mouth daily.  . Vitamin D, Ergocalciferol, (DRISDOL) 50000 units CAPS capsule Take 1 capsule (50,000 Units total) by mouth every 7 (seven) days.   No facility-administered encounter medications on file as of 03/28/2016.    Allergies  Allergen Reactions  . Ciprofloxacin Shortness Of Breath and Rash  . Shrimp [Shellfish Allergy] Hives and Shortness Of Breath    ER required  . Dicyclomine Hives  . Methocarbamol Hives  . Penicillins Hives    Has patient had a PCN reaction causing immediate rash, facial/tongue/throat swelling, SOB or lightheadedness with hypotension: Yes Has patient had a PCN reaction causing severe rash involving mucus membranes or skin necrosis: No Has patient had a PCN reaction that required hospitalization No Has patient had a PCN reaction occurring within the last 10 years: No If all of the above answers are "NO", then may proceed with Cephalosporin use.    Patient Active Problem List   Diagnosis Date Noted  . Leg swelling 02/29/2016  . 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 Esophageal dysmotility 11/08/2015  . Encounter for routine gynecological examination 10/19/2015  . Urine incontinence 10/19/2015  . Restrictive lung disease 10/11/2015  . Headache 09/14/2015  . Numbness and tingling 09/14/2015  . Acute bronchitis 08/05/2015  . Headache, migraine   . HTN (hypertension), benign   . Shortness of breath   . Diplopia   . Dysphagia, neurologic   . Elevated LDH 05/06/2015  . Hypokalemia 04/07/2015  . Lymphadenopathy 04/07/2015  . Splenomegaly 04/07/2015  . Myasthenia gravis ([place])   . Osteoarthritis of left knee 12/02/2013  . Osteoarthritis of right knee 08/30/2013  . GERD (gastroesophageal reflux disease) 06/23/2013  . Obesity 06/23/2013  . Anxiety and depression 06/23/2013  . Fibroids 06/23/2013   Social History   Social History  . Marital status: Married    Spouse name: N/A  . Number of children: 3  . Years of  education: N/A   Occupational History  . disabled    Social History Main Topics  . Smoking status: Former Smoker    Packs/day: 0.50    Years: 38.00    Types: Cigarettes    Start date: 03/03/1974    Quit date: 05/14/2012  . Smokeless tobacco: Never Used     Comment: denies thoughts of restarting  . Alcohol use No  . Drug use: No  . Sexual activity: Not Currently    Birth control/ protection: Surgical   Other Topics Concern  . Not on file   Social History Narrative   Work or School: [employer] Situation: lives with husband and daughters      Spiritual Beliefs: Christian      Lifestyle: no regular exercise; trying to eat healthy             Ms. Buskirk's family history includes Anemia in her sister; Heart disease (age of onset: 110) in her mother; Hypertension  in her father and mother; Other in her sister; Stroke in her father.      Objective:    Vitals:   03/28/16 1449  BP: (!) 102/54  Pulse: 88    Physical Exam  well-developed morbidly obese  female in no acute distress. Blood pressure 102/54 pulse 88, height 5 foot, weight 290, BMI 56.7. HEENT; nontraumatic normocephalic EOMI PERRLA sclera anicteric, Cardiovascular; regular rate and rhythm with S1-S2 no murmur or gallop, Pulmonary; clear bilaterally, Abdomen ;morbidly obese soft sounds are present generalized tenderness across the upper abdomen there is no evidence of cellulitis, no palpable mass or hepatosplenomegaly PEG is present in the epigastrium, she has some mild skin irritation at the PEG site but no obvious infection, no drainage. Rectal ;exam not done, Ext; no clubbing cyanosis or edema skin warm and dry, Neuropsych ;mood and affect appropriate       Assessment & Plan:   #40 57 year old female with myasthenia gravis who is status post PEG placement for neurogenic dysphagia in June 2017. Dysphagia has resolved and she is eating all consistencies without difficulty and not  using PEG tube over the past couple of months. Patient now presents with 5-6 day history of generalized upper abdominal pain and increased pain postprandially. Etiology is not clear, she doesn't have any evidence of cellulitis, or pain with manipulation of the PEG, cannot rule out PEG  associated ulcer, malpositioning of PEG or other upper abdominal acute inflammatory process  #2 morbid obesity #3 respiratory failure spring 2017 requiring temporary trach  Plan; CBC with differential, CMET Increase omeprazole to 20 mg by mouth twice a day Small frequent feedings with soft bland diet Schedule for CT of the abdomen and pelvis with contrast. CT is unrevealing and symptoms are persisting she'll need EGD with Dr. Carlean Purl. We discussed potentially removing the PEG tube which she is reluctant to do at this time in the event of future exacerbation of myasthenia.  Amy S Esterwood PA-C 03/28/2016   Cc: [name], MD   Agree with Ms. Genia Harold assessment and plan.  If CT negative want to be sure to reassess sxs before going to EGD.  Gatha Mayer, MD, [facility]

## 2016-03-28 NOTE — Patient Instructions (Signed)
Your physician has requested that you go to the basement for the following lab work before leaving today: CBC, Lipase, CMET  Please increase your omeprazole to 2 tablets twice daily (you should already have this medication at home).  Please follow a soft, bland diet.  You have been scheduled for a CT scan of the abdomen and pelvis at [facility] ([address]---this is in the same building as [facility]).   You are scheduled on Thursday, 04/03/16 at 9:00 am. You should arrive 15 minutes prior to your appointment time for registration. Please follow the written instructions below on the day of your exam:  WARNING: IF YOU ARE ALLERGIC TO IODINE/X-RAY DYE, PLEASE NOTIFY RADIOLOGY IMMEDIATELY AT [phone]! YOU WILL BE GIVEN A 13 HOUR PREMEDICATION PREP.  1) Do not eat or drink anything after 5:00 am (4 hours prior to your test) 2) You have been given 2 bottles of oral contrast to drink. The solution may taste better if refrigerated, but do NOT add ice or any other liquid to this solution. Shake well before drinking.    Drink 1 bottle of contrast @ 7:00 am (2 hours prior to your exam)  Drink 1 bottle of contrast @ 8:00 am (1 hour prior to your exam)  You may take any medications as prescribed with a small amount of water except for the following: Metformin, Glucophage, Glucovance, Avandamet, Riomet, Fortamet, Actoplus Met, Janumet, Glumetza or Metaglip. The above medications must be held the day of the exam AND 48 hours after the exam.  The purpose of you drinking the oral contrast is to aid in the visualization of your intestinal tract. The contrast solution may cause some diarrhea. Before your exam is started, you will be given a small amount of fluid to drink. Depending on your individual set of symptoms, you may also receive an intravenous injection of x-ray contrast/dye. Plan on being at [facility] for 30 minutes or longer, depending on the type of exam you are  having performed.  This test typically takes 30-45 minutes to complete.  If you have any questions regarding your exam or if you need to reschedule, you may call the CT department at [phone] between the hours of 8:00 am and 5:00 pm, Monday-Friday.  ________________________________________________________________________  If you are age 45 or younger, your body mass index should be between 19-25. Your Body mass index is 56.71 kg/m. If this is out of the aformentioned range listed, please consider follow up with your Primary Care Provider.

## 2016-04-01 ENCOUNTER — Other Ambulatory Visit: Payer: Self-pay

## 2016-04-01 ENCOUNTER — Telehealth: Payer: Self-pay | Admitting: Physician Assistant

## 2016-04-01 DIAGNOSIS — D508 Other iron deficiency anemias: Secondary | ICD-10-CM

## 2016-04-01 NOTE — Telephone Encounter (Signed)
See lab notes

## 2016-04-03 ENCOUNTER — Other Ambulatory Visit: Payer: Self-pay

## 2016-04-03 ENCOUNTER — Ambulatory Visit (INDEPENDENT_AMBULATORY_CARE_PROVIDER_SITE_OTHER)
Admission: RE | Admit: 2016-04-03 | Discharge: 2016-04-03 | Disposition: A | Discharge status: Home / Self Care | Payer: BC Managed Care – PPO | Source: Ambulatory Visit | Attending: Physician Assistant | Admitting: Physician Assistant

## 2016-04-03 ENCOUNTER — Other Ambulatory Visit (INDEPENDENT_AMBULATORY_CARE_PROVIDER_SITE_OTHER): Payer: BC Managed Care – PPO

## 2016-04-03 DIAGNOSIS — Z931 Gastrostomy status: Secondary | ICD-10-CM

## 2016-04-03 DIAGNOSIS — D508 Other iron deficiency anemias: Secondary | ICD-10-CM

## 2016-04-03 DIAGNOSIS — R1013 Epigastric pain: Secondary | ICD-10-CM | POA: Diagnosis not present

## 2016-04-03 LAB — IBC PANEL
Iron: 40 ug/dL — ABNORMAL LOW (ref 42–145)
Saturation Ratios: 8.5 % — ABNORMAL LOW (ref 20.0–50.0)
Transferrin: 336 mg/dL (ref 212.0–360.0)

## 2016-04-03 LAB — FERRITIN: Ferritin: 21.4 ng/mL (ref 10.0–291.0)

## 2016-04-03 MED ORDER — IOPAMIDOL (ISOVUE-300) INJECTION 61%
100.0000 mL | Freq: Once | INTRAVENOUS | Status: AC | PRN
  Administered 2016-04-03: 100 mL via INTRAVENOUS

## 2016-04-03 MED ORDER — POLYSACCHARIDE IRON COMPLEX 150 MG PO CAPS: 150.0000 mg | ORAL_CAPSULE | Freq: Two times a day (BID) | ORAL | 2 refills | Status: DC

## 2016-04-11 ENCOUNTER — Telehealth: Payer: Self-pay | Admitting: Family Medicine

## 2016-04-11 NOTE — Telephone Encounter (Signed)
Patient requesting referral to pulm rehab. Order placed but apparently it seems it has to be faxed or taken to their office. A copy of order printed and given to her daughter. I also advised her to get me a fax number to fax the document.  I called Dr. Renaye Rakers office to further clarify what they are needing from me but I was unable to reach him. Message left for him to call me back.

## 2016-04-14 ENCOUNTER — Other Ambulatory Visit: Payer: Self-pay | Admitting: Family Medicine

## 2016-04-14 DIAGNOSIS — J984 Other disorders of lung: Secondary | ICD-10-CM

## 2016-04-14 DIAGNOSIS — R0602 Shortness of breath: Secondary | ICD-10-CM

## 2016-04-21 ENCOUNTER — Encounter: Payer: Self-pay | Admitting: Family Medicine

## 2016-04-21 ENCOUNTER — Ambulatory Visit (INDEPENDENT_AMBULATORY_CARE_PROVIDER_SITE_OTHER): Payer: BC Managed Care – PPO | Admitting: Family Medicine

## 2016-04-21 VITALS — BP 102/60 | HR 85 | Temp 98.5°F | Wt 286.0 lb

## 2016-04-21 DIAGNOSIS — L259 Unspecified contact dermatitis, unspecified cause: Secondary | ICD-10-CM

## 2016-04-21 DIAGNOSIS — J069 Acute upper respiratory infection, unspecified: Secondary | ICD-10-CM

## 2016-04-21 DIAGNOSIS — B9789 Other viral agents as the cause of diseases classified elsewhere: Secondary | ICD-10-CM

## 2016-04-21 MED ORDER — BENZONATATE 200 MG PO CAPS: 200.0000 mg | ORAL_CAPSULE | Freq: Two times a day (BID) | ORAL | 0 refills | Status: DC | PRN

## 2016-04-21 NOTE — Patient Instructions (Signed)
You some hydrocortisone cream for your neck if you want.  Restart the fluticasone nasal spray.  Also start afrin for the next couple of days.  We will send in [place].  If you are not getting better in the next 7 days, let us know.  Take care,  Dr Jerline Pain

## 2016-04-21 NOTE — Progress Notes (Signed)
    Subjective:  Frances Medina is a 56 y.o. female who presents to the [facility] today with a chief complaint of rash.   HPI:  Rash Started a couple of days ago. Her husband was sick and she was wearing a mask to keep form getting sick. The next day, she broke in the area where the mask was. She has been using anointing oil to the area and it has improved a lot over the past few days. No other contacts. No other treatments tried. No shortness of breath. No difficulty breathing or swallowing.   Cough Also started about 2 days ago. Has been taking children's robutussin which has not seemed to help. Cough is worse at night and is keeping her up. No fevers. No shortness of breath. Frequent cough is causing her to have rib and head pain.   ROS: Per HPI  PMH: Smoking history reviewed.   Objective:  Physical Exam: BP 102/60   Pulse 85   Temp 98.5 F (36.9 C) (Oral)   Wt 286 lb (129.7 kg)   SpO2 94%   BMI 55.86 kg/m   Gen: NAD, resting comfortably HEENT: MMM, OP clear.  CV: RRR with no murmurs appreciated Pulm: NWOB, CTAB with no crackles, wheezes, or rhonchi MSK: no edema, cyanosis, or clubbing noted Skin: Faintly erythematous confluent rash across throat and submandibular area with a few small, scattered, faint papules approximately 1-45mm in diameter.  Neuro: grossly normal, moves all extremities Psych: Normal affect and thought content  Assessment/Plan:  Contact Dermatitis Rash likely due to contact dermatitis. Now improving. Recommended OTC hydrocortisone cream as needed.  Viral URI with Cough No signs of bacterial infection. Will treat symptoms with tessalon, 3 days of afrin, and fluticasone nasal spray. Return precautions reviewed. Follow up as needed.   Algis Greenhouse. Jerline Pain, [place] Resident PGY-3 04/21/2016 11:50 AM

## 2016-04-28 ENCOUNTER — Telehealth (HOSPITAL_COMMUNITY): Payer: Self-pay | Admitting: *Deleted

## 2016-04-29 ENCOUNTER — Other Ambulatory Visit: Payer: Self-pay | Admitting: *Deleted

## 2016-04-29 NOTE — Telephone Encounter (Signed)
Patient had already reached out to her neurologist and he sent this in for her. Frances Medina,CMA

## 2016-04-29 NOTE — Telephone Encounter (Addendum)
,    Please contact patient; she need to get refill from her neurologist for Mestinon

## 2016-04-29 NOTE — Telephone Encounter (Signed)
Refill request for mestinon. [facility]

## 2016-05-09 ENCOUNTER — Other Ambulatory Visit: Payer: Self-pay | Admitting: *Deleted

## 2016-05-09 MED ORDER — CLONAZEPAM 1 MG PO TABS: 1.0000 mg | ORAL_TABLET | Freq: Two times a day (BID) | ORAL | 2 refills | Status: DC | PRN

## 2016-05-09 MED ORDER — BENZONATATE 200 MG PO CAPS: 200.0000 mg | ORAL_CAPSULE | Freq: Two times a day (BID) | ORAL | 0 refills | Status: DC | PRN

## 2016-06-03 ENCOUNTER — Other Ambulatory Visit: Payer: Self-pay | Admitting: Family Medicine

## 2016-06-03 NOTE — Telephone Encounter (Signed)
Need potassium recheck for next refill

## 2016-06-23 ENCOUNTER — Other Ambulatory Visit: Payer: Self-pay | Admitting: Family Medicine

## 2016-06-23 ENCOUNTER — Other Ambulatory Visit (HOSPITAL_COMMUNITY): Payer: Self-pay | Admitting: Family Medicine

## 2016-06-23 DIAGNOSIS — R1310 Dysphagia, unspecified: Secondary | ICD-10-CM

## 2016-06-23 NOTE — Progress Notes (Signed)
Daughter brought in fluoroscopy swallow eval request from [facility]. Order placed. To fax result to [phone].  I called radiology to discuss order request. I was advised to contact Neuro Rehab at [phone]. I called and spoke with Derald Macleod. She scheduled an appointment for Frances Medina for May 1st at 11:30 PM at [facility].  I placed order request at the front office to be faxed to [facility] at [phone]. Patient's daughter informed.

## 2016-06-24 ENCOUNTER — Other Ambulatory Visit: Payer: Self-pay | Admitting: Family Medicine

## 2016-07-01 ENCOUNTER — Ambulatory Visit (HOSPITAL_COMMUNITY): Admission: RE | Admit: 2016-07-01 | Payer: BC Managed Care – PPO | Source: Ambulatory Visit

## 2016-07-01 ENCOUNTER — Ambulatory Visit (HOSPITAL_COMMUNITY): Payer: BC Managed Care – PPO

## 2016-07-07 ENCOUNTER — Telehealth: Payer: Self-pay

## 2016-07-07 ENCOUNTER — Encounter: Payer: Self-pay | Admitting: Obstetrics and Gynecology

## 2016-07-07 ENCOUNTER — Ambulatory Visit (INDEPENDENT_AMBULATORY_CARE_PROVIDER_SITE_OTHER): Payer: BC Managed Care – PPO | Admitting: Obstetrics and Gynecology

## 2016-07-07 VITALS — BP 122/80 | HR 77 | Temp 97.7°F | Wt 297.0 lb

## 2016-07-07 DIAGNOSIS — M7989 Other specified soft tissue disorders: Secondary | ICD-10-CM

## 2016-07-07 MED ORDER — PREDNISONE 2.5 MG PO TABS: 2.5000 mg | ORAL_TABLET | Freq: Every day | ORAL | 0 refills | Status: DC

## 2016-07-07 MED ORDER — PYRIDOSTIGMINE BROMIDE 60 MG PO TABS: 30.0000 mg | ORAL_TABLET | Freq: Three times a day (TID) | ORAL | 0 refills | Status: DC

## 2016-07-07 NOTE — Telephone Encounter (Signed)
Pt contacted and informed of may 9th venous US apt at [place] at 9am. Pt voiced understanding.

## 2016-07-07 NOTE — Progress Notes (Signed)
Subjective:   Patient ID: Frances Medina, female    DOB: August 06, 1960, 56 y.o.   MRN: [id number]  Patient presents for Same Day Appointment  Chief Complaint  Patient presents with  . Edema    ankle     HPI: # Leg Swelling Has h/o leg swelling This current worsening she is unsure of when it started because she has been ignoring it Location: both legs but L>R Associated tenderness to leg on left New medications: no but has had medication adjustments recently On medication list is diuretics however patient states she had not been taking in over a month and just restarted this weekend History of Kidney problems: no History of Heart problems: no History of Liver problems: no History of cancer: no  Symptoms Chest pain: no Shortness of breath: sometimes with exertion Immobility: no but does spend a lot of time sitting Abdomen swelling: no History of blood clots: no Weight Gain: yes  Review of Systems   See HPI for ROS.   History  Smoking Status  . Former Smoker  . Packs/day: 0.50  . Years: 38.00  . Types: Cigarettes  . Start date: 03/03/1974  . Quit date: 05/14/2012  Smokeless Tobacco  . Never Used    Comment: denies thoughts of restarting   Current Outpatient Prescriptions on File Prior to Visit  Medication Sig Dispense Refill  . albuterol (PROAIR HFA) 108 (90 Base) MCG/ACT inhaler Inhale 2 puffs into the lungs every 4 (four) hours as needed for wheezing or shortness of breath. 1 Inhaler 4  . albuterol (PROVENTIL) (2.5 MG/3ML) 0.083% nebulizer solution Take 3 mLs (2.5 mg total) by nebulization every 6 (six) hours as needed for shortness of breath. 150 mL 2  . Amino Acids-Protein Hydrolys (FEEDING SUPPLEMENT, PRO-STAT SUGAR FREE 64,) LIQD Place 30 mLs into feeding tube 2 (two) times daily. 900 mL 0  . benzonatate (TESSALON) 200 MG capsule Take 1 capsule (200 mg total) by mouth 2 (two) times daily as needed for cough. 20 capsule 0  . clonazePAM (KLONOPIN) 1 MG tablet Take 1  tablet (1 mg total) by mouth 2 (two) times daily as needed for anxiety. 45 tablet 2  . ferrous sulfate 325 (65 FE) MG tablet Take 1 tablet (325 mg total) by mouth 2 (two) times daily with a meal. (Patient taking differently: Take 325 mg by mouth daily. ) 60 tablet 0  . furosemide (LASIX) 20 MG tablet TAKE 1 TABLET EVERY DAY 90 tablet 1  . guaiFENesin (ROBITUSSIN) 100 MG/5ML SOLN Take 15 mLs by mouth daily as needed for cough or to loosen phlegm.    [name] ibuprofen (ADVIL,MOTRIN) 200 MG tablet Take 800 mg by mouth as needed for mild pain. Reported on 09/14/2015    . iron polysaccharides (NU-IRON) 150 MG capsule Take 1 capsule (150 mg total) by mouth 2 (two) times daily. 60 capsule 2  . lactose free nutrition (BOOST PLUS) LIQD Take 237 mLs by mouth 3 (three) times daily with meals. 237 mL 2  . Menthol (COUGH DROPS) 10 MG LOZG Take 1 lozenge by mouth daily as needed (cough).    . mycophenolate (CELLCEPT) 500 MG tablet Take 1,000 mg by mouth 2 (two) times daily.     . Nutritional Supplements (FEEDING SUPPLEMENT, JEVITY 1.5 CAL/FIBER,) LIQD Place 275 mLs into feeding tube 4 (four) times daily. 1000 mL 0  . nystatin cream (MYCOSTATIN) Apply 1 application topically 2 (two) times daily. 30 g 0  . Nystatin POWD Apply to wound  twice a day - G-tube site  0  . omeprazole (PRILOSEC) 20 MG capsule Take 1 capsule (20 mg total) by mouth daily. 90 capsule 1  . potassium chloride (KLOR-CON) 20 MEQ packet MIX 1 PACKET DAILY AS DIRECTED 30 packet 0  . sertraline (ZOLOFT) 100 MG tablet TAKE 1 TABLET EVERY DAY 90 tablet 1  . Vitamin D, Ergocalciferol, (DRISDOL) 50000 units CAPS capsule Take 1 capsule (50,000 Units total) by mouth every 7 (seven) days. 4 capsule 0   No current facility-administered medications on file prior to visit.    Past medical history, surgical, family, and social history reviewed and updated in the EMR as appropriate.   Objective:  BP 122/80   Pulse 77   Temp 97.7 F (36.5 C) (Oral)   Wt 297 lb  (134.7 kg)   SpO2 97%   BMI 58.00 kg/m  Vitals and nursing note reviewed  Physical Exam  Constitutional: She is well-developed, well-nourished, and in no distress.  Cardiovascular: Normal rate.   Pulmonary/Chest: Effort normal. No respiratory distress.  Musculoskeletal: She exhibits edema.  1+ edema to RLE and 2+ edema to LLE. TTP on left leg over various veins.   Skin: Skin is warm and dry. No rash noted. No erythema.    Assessment & Plan:  1. Leg swelling Leg swelling of bilateral legs L>R. Clinically consistent with venous insufficiency and effect of chronic steroids. Patient had stopped doing compression stockings and Lasix. Encouraged patient to reinitiate these therapies with a double of her lasix for the next 3 days. In setting of tenderness to LLE and increased swelling will get [place] to r/o DVT. Prior work-up for other causes of leg swelling was unremarkable.   Diagnosis and plan along with any newly prescribed medication(s) were discussed in detail with this patient today. The patient verbalized understanding and agreed with the plan. Patient advised if symptoms worsen return to clinic or ER.   PATIENT EDUCATION PROVIDED: See AVS   Luiz Blare, DO 07/07/2016, 9:14 AM PGY-3, [place]

## 2016-07-07 NOTE — Patient Instructions (Signed)
Leg swelling likely from venous insufficiency  Important for you to take your Lasix daily  For the next 3 day increase dose to 20mg  twice a day, then go back to daily  Getting an [place] to check for blood clot  ELAVATE LEGS AND WEAR COMPRESSION STOCKINGS   Chronic Venous Insufficiency Chronic venous insufficiency, also called venous stasis, is a condition that prevents blood from being pumped effectively through the veins in your legs. Blood may no longer be pumped effectively from the legs back to the heart. This condition can range from mild to severe. With proper treatment, you should be able to continue with an active life. What are the causes? Chronic venous insufficiency occurs when the vein walls become stretched, weakened, or damaged, or when valves within the vein are damaged. Some common causes of this include:  High blood pressure inside the veins (venous hypertension).  Increased blood pressure in the leg veins from long periods of sitting or standing.  A blood clot that blocks blood flow in a vein (deep vein thrombosis, DVT).  Inflammation of a vein (phlebitis) that causes a blood clot to form.  Tumors in the pelvis that cause blood to back up. What increases the risk? The following factors may make you more likely to develop this condition:  Having a family history of this condition.  Obesity.  Pregnancy.  Living without enough physical activity or exercise (sedentary lifestyle).  Smoking.  Having a job that requires long periods of standing or sitting in one place.  Being a certain age. Women in their 21s and 104s and men in their 79s are more likely to develop this condition. What are the signs or symptoms? Symptoms of this condition include:  Veins that are enlarged, bulging, or twisted (varicose veins).  Skin breakdown or ulcers.  Reddened or discolored skin on the front of the leg.  Brown, smooth, tight, and painful skin just above the ankle, usually  on the inside of the leg (lipodermatosclerosis).  Swelling. How is this diagnosed? This condition may be diagnosed based on:  Your medical history.  A physical exam.  Tests, such as:  A procedure that creates an image of a blood vessel and nearby organs and provides information about blood flow through the blood vessel (duplex ultrasound).  A procedure that tests blood flow (plethysmography).  A procedure to look at the veins using X-ray and dye (venogram). How is this treated? The goals of treatment are to help you return to an active life and to minimize pain or disability. Treatment depends on the severity of your condition, and it may include:  Wearing compression stockings. These can help relieve symptoms and help prevent your condition from getting worse. However, they do not cure the condition.  Sclerotherapy. This is a procedure involving an injection of a material that "dissolves" damaged veins.  Surgery. This may involve:  Removing a diseased vein (vein stripping).  Cutting off blood flow through the vein (laser ablation surgery).  Repairing a valve. Follow these instructions at home:  Wear compression stockings as told by your health care provider. These stockings help to prevent blood clots and reduce swelling in your legs.  Take over-the-counter and prescription medicines only as told by your health care provider.  Stay active by exercising, walking, or doing different activities. Ask your health care provider what activities are safe for you and how much exercise you need.  Drink enough fluid to keep your urine clear or pale yellow.  Do  not use any products that contain nicotine or tobacco, such as cigarettes and e-cigarettes. If you need help quitting, ask your health care provider.  Keep all follow-up visits as told by your health care provider. This is important. Contact a health care provider if:  You have redness, swelling, or more pain in the affected  area.  You see a red streak or line that extends up or down from the affected area.  You have skin breakdown or a loss of skin in the affected area, even if the breakdown is small.  You get an injury in the affected area. Get help right away if:  You get an injury and an open wound in the affected area.  You have severe pain that does not get better with medicine.  You have sudden numbness or weakness in the foot or ankle below the affected area, or you have trouble moving your foot or ankle.  You have a fever and you have worse or persistent symptoms.  You have chest pain.  You have shortness of breath. Summary  Chronic venous insufficiency, also called venous stasis, is a condition that prevents blood from being pumped effectively through the veins in your legs.  Chronic venous insufficiency occurs when the vein walls become stretched, weakened, or damaged, or when valves within the vein are damaged.  Treatment for this condition depends on how severe your condition is, and it may involve wearing compression stockings or having a procedure.  Make sure you stay active by exercising, walking, or doing different activities. Ask your health care provider what activities are safe for you and how much exercise you need. This information is not intended to replace advice given to you by your health care provider. Make sure you discuss any questions you have with your health care provider. Document Released: 06/23/2006 Document Revised: 01/07/2016 Document Reviewed: 01/07/2016 Elsevier Interactive Patient Education  2017 [employer].

## 2016-07-08 ENCOUNTER — Encounter: Payer: Self-pay | Admitting: Family Medicine

## 2016-07-08 ENCOUNTER — Ambulatory Visit (INDEPENDENT_AMBULATORY_CARE_PROVIDER_SITE_OTHER): Payer: BC Managed Care – PPO | Admitting: Family Medicine

## 2016-07-08 NOTE — Patient Instructions (Addendum)
Diet Recommendations:  Starchy (carb) foods: Bread, rice, pasta, potatoes, corn, cereal, grits, crackers, bagels, muffins, all baked goods.  (Fruits, milk, and yogurt also have carbohydrate, but for your purposes, I recommend you focus mostly on these STARCHY foods, and not restrict fruit or dairy in normal portion sizes.)   Protein foods: Meat, fish, poultry, eggs, dairy foods, and beans such as pinto and kidney beans (beans also provide carbohydrate).   1. Eat at least 3 meals and 1-2 snacks per day. Never go more than 4-5 hours while awake without eating. Eat breakfast within the first hour of getting up.   2. Limit starchy foods to ONE to TWO portions per meal and no more than ONE starchy food per snack. ONE portion of a starchy  food is equal to the following:   - ONE slice of bread (or its equivalent, such as half of a hamburger bun).   - 1/2 cup of a "scoopable" starchy food such as potatoes or rice.   - 15 grams of Total Carbohydrate as shown on food label.  3. Include at every meal: a protein food, a carb food, and vegetables and/or fruit.   - Obtain twice the volume of veg's as protein or carbohydrate foods for both lunch and dinner.   - Fresh or frozen veg's are best.   - Keep frozen veg's on hand for a quick vegetable serving.      - Snack ideas:  Fresh fruit, unsweetened apple sauce, any veg's, yogurt, 5-6 whole grain crackers (such as Triscuits) with 1-2 oz cheese or a tbsp peanut butter, 1/2 sandwich, turkey-pickle wrap, nuts or nuts mixed with dried fruit.  (Portion out servings of nuts & dried fruit.)

## 2016-07-08 NOTE — Progress Notes (Signed)
Medical Nutrition Therapy:  Appt start time: 1030 end time:  1130.  Assessment:  Primary concerns today: Weight management, E66.01 (BMI 50 or higher).  Ms. Creegan lives with her husband and two daughters (including Deseree, on staff at [employer]).  She does most of the cooking, although she often eats a salad instead of what she cooks.  She tends to have a poor appetite until 8 or 9 PM, when she frequently craves chocolate.    Ms. Ozawa wants to lose weight, and to eat healthier.  She has been drinking 2 tsp apple cider vinegar in hot water, with 2 tsp lemon juice, small piece candied ginger, and 1 1/2 tsp honey twice daily in an effort to manage her weight.  She said she has no appetite, and gets very tired after eating, so usually sleeps most of the day.     She is on prednisone for myasthenia gravis, now down to 2.5 mg per day.  This has made weight control more difficult for her.    Ms. Stanczyk will be getting her G-tube removed May 29.  She had the tube placed more than a year ago b/c of swallowing problems related to myasthenia gravis.    Learning Readiness: Ready  Usual eating pattern includes 1-2 meals and 2 snacks per day. Frequent foods and beverages include coffee with 1 tsp sugar, 2 tsp pwdrd creamer, Sprite ~5 X wk, water; chx, veg's rice/potatoes.  Avoided foods include shellfish (allergy), cinnamon (allergy), pineapple (allergy).   Usual physical activity includes none currently.  24-hr recall: (Up at 4:45 AM; went back to bed ~noon; ) B (5:15 AM)-   2 c coffee, 2 tsp sugar, 3 tsp creamer Snk ( AM)-   ---  L (11:30 AM)-  1 large chef salad (ham, [place], chs), 3 tbsp balsamic vinaigrette, 12 oz Sprite Snk ( PM)-  --- D (5:30 PM)-  Red Lobster: 2 1/2 c chx alfredo, 2 slc garlic bread, 6-8 oz Coke Snk ( PM)-  --- Typical day? Yes.  Except usually eats out only ~2 X wk.    Progress Towards Goal(s):  In progress.   Nutritional Diagnosis:  [place]io-3.3 Overweight/obesity As related to  energy balance.  As evidenced by BMI >55.    Intervention:  Nutrition education  Handouts given during visit include:  AVS  Demonstrated degree of understanding via:  Teach Back  Barriers to learning/adherence to lifestyle change: poor mobility and minimal physical activity related to joint pain and fatigue.    Monitoring/Evaluation:  Dietary intake, exercise, and body weight in 6 week(s).

## 2016-07-09 ENCOUNTER — Telehealth: Payer: Self-pay | Admitting: Family Medicine

## 2016-07-09 ENCOUNTER — Ambulatory Visit (HOSPITAL_COMMUNITY)
Admission: RE | Admit: 2016-07-09 | Discharge: 2016-07-09 | Disposition: A | Discharge status: Home / Self Care | Payer: BC Managed Care – PPO | Source: Ambulatory Visit | Attending: Family Medicine | Admitting: Family Medicine

## 2016-07-09 ENCOUNTER — Ambulatory Visit: Payer: BC Managed Care – PPO | Admitting: Physician Assistant

## 2016-07-09 DIAGNOSIS — M7989 Other specified soft tissue disorders: Secondary | ICD-10-CM | POA: Insufficient documentation

## 2016-07-09 NOTE — Telephone Encounter (Signed)
Attempted to contact pt, however, no answer or option for VM. When pt calls back please inform her of negative [place] and schedule with pcp for next Friday or following Tuesday.

## 2016-07-09 NOTE — Telephone Encounter (Signed)
Neg Doppler U/S for DVT per Lyndee Leo from vascular lab. Please contact patient to schedule follow up for leg swelling with me in the next 1-2 weeks. Advise her to come in sooner if symptoms worsens.

## 2016-07-09 NOTE — Progress Notes (Signed)
*  PRELIMINARY RESULTS* Vascular Ultrasound Left lower extremity venous duplex has been completed.  Preliminary findings: No evidence of deep vein thrombosis in the visualized veins of the left lower extremity.  Negative for baker's cyst. Mild interstitial fluid seen anterior calf, area of pain.  Preliminary report called to Dr. Gwendlyn Deutscher @ 9:15, patient okay to leave.   Frances Medina 07/09/2016, 9:32 AM

## 2016-07-18 ENCOUNTER — Other Ambulatory Visit: Payer: Self-pay | Admitting: Family Medicine

## 2016-07-18 NOTE — Telephone Encounter (Signed)
Refill is due in June

## 2016-07-18 NOTE — Telephone Encounter (Signed)
I spoke with patient. She is out of her meds. She picked up two refills in April instead of one. Granted that I only gave her 45 tablets. I increased her tablet quantity to 60 per month. She is advised to only pick up med once a month. She agreed with plan.

## 2016-07-19 ENCOUNTER — Other Ambulatory Visit: Payer: Self-pay | Admitting: Family Medicine

## 2016-08-06 ENCOUNTER — Telehealth: Payer: Self-pay

## 2016-08-06 ENCOUNTER — Other Ambulatory Visit: Payer: Self-pay | Admitting: Family Medicine

## 2016-08-06 MED ORDER — OMEPRAZOLE 20 MG PO CPDR: 20.0000 mg | DELAYED_RELEASE_CAPSULE | Freq: Every day | ORAL | 1 refills | Status: DC

## 2016-08-06 NOTE — Telephone Encounter (Signed)
Refill completed. Thanks.

## 2016-08-06 NOTE — Telephone Encounter (Signed)
Pt requesting a refill on her acid reflux medication, please advise.

## 2016-08-21 ENCOUNTER — Ambulatory Visit (INDEPENDENT_AMBULATORY_CARE_PROVIDER_SITE_OTHER): Payer: BC Managed Care – PPO | Admitting: Family Medicine

## 2016-08-21 ENCOUNTER — Encounter: Payer: Self-pay | Admitting: Family Medicine

## 2016-08-21 DIAGNOSIS — Z6841 Body Mass Index (BMI) 40.0 and over, adult: Secondary | ICD-10-CM

## 2016-08-21 NOTE — Patient Instructions (Addendum)
-   I recommend that you follow up with Dr. Gwendlyn Deutscher about ankle/foot swelling.  She may have a suggestion for medication or something else to try for your swelling.    - Re-start your furosemide medication, and pay attention to how this affects the swelling.    - Having to get up and go to the bathroom is a very good thing, even though not always convenient.  Each time you do get up to use the bathroom, take that opportunity to do a short walk around the house.    - I would also like for you to talk with Dr. Gwendlyn Deutscher about your poor sleep.  As long as your sleep is inadequate, it will be much harder for you to lose weight.  Re-evaluating the fit and function of your C-PAP machine might be in order.    - The next time you see your doctor at [facility], mention the itchiness you feel is related to the mestinon medication.    - Call [employer] (closest one to where you live) and the [facility] to find out about water exercise classes and costs.  Inquire about the [employer] program or any other fitness benefit of your health insurance plan.    Goals: 1. Eat at least 4 times a day, including something (more than coffee) within the first hour (or at most, 2 hours) of getting up.   2. Include vegetables at least twice a day.    At follow-up, we'll establish a specific physical activity goal, but feel free to get started on this ASAP!  - Complete your Goals Sheet, and bring to follow-up appt.

## 2016-08-21 NOTE — Progress Notes (Signed)
Medical Nutrition Therapy:  Appt start time: 1400 end time:  1500.  Assessment:  Primary concerns today: Weight management, E66.01 (BMI 50 or higher).  Frances Medina had her G-tube removed in late May.  She said she has no appetite.  After her last nutrition appt, she stopped drinking soda, but she recently has been having a few sips of soda per day.  More often she drinks plain water or 16 oz water with 2 tsp each of honey, lemon, and apple cider vinegar.    Still having swelling in left foot.  Uses her compression sock and tries to keep it elevated, but has noticed little improvement.  Upon further questioning, it became clear that she actually has not been taking her furosemide (although this was not indicated in initial med reconciliation).    Frances Medina is getting very poor and inadequate sleep, going to bed ~11:30 PM, waking frequently, and getting up usually around 4:30 AM.  She doesn't feel that her C-PAP helps her.  She seldom naps during the day despite feeling tired.    Learning Readiness: Ready  Usual eating pattern includes eating 1-3 X day, seldom "real" meals. Recent physical activity includes sporadic walking ~30 min at the mall or [employer].    24-hr recall:  (Up at 4:30 AM; had gone to bed ~11:30 PM; tossed and turned per usual) B (5 AM)-  1coffee with 1 tsp sugar, 2 tsp pwdrd creamer Snk ( AM)-  --- L (1 PM)-  6 pc [employer] chx nuggets, small ff's, water Snk (3 PM)-  1 c watermelon, water D (5 PM)-  1 chx brst w/ marinara sauce, 1 c mac&chs, 2 c canned green beans, 4 oz gingerale Snk ( PM)-  --- Typical day? Yes.  Some days she has tuna on green salad with vinaigrette dressing at mid-day.    Progress Towards Goal(s):  In progress.   Nutritional Diagnosis: No progress on  [place]-3.3 Overweight/obesity As related to energy balance.  As evidenced by no weight loss since last appt and few dietary changes achieved.    Intervention:  Nutrition education  Handouts given during  visit include:  AVS  Demonstrated degree of understanding via:  Teach Back  Barriers to learning/adherence to lifestyle change: poor mobility and minimal physical activity related to joint pain and fatigue.    Monitoring/Evaluation:  Dietary intake, exercise, and body weight in 6 week(s).

## 2016-08-28 ENCOUNTER — Telehealth: Payer: Self-pay | Admitting: Family Medicine

## 2016-08-28 ENCOUNTER — Ambulatory Visit (INDEPENDENT_AMBULATORY_CARE_PROVIDER_SITE_OTHER): Payer: BC Managed Care – PPO | Admitting: Family Medicine

## 2016-08-28 ENCOUNTER — Encounter: Payer: Self-pay | Admitting: Family Medicine

## 2016-08-28 VITALS — BP 122/86 | HR 90 | Temp 97.8°F | Resp 20 | Ht 60.0 in | Wt 297.0 lb

## 2016-08-28 DIAGNOSIS — G4733 Obstructive sleep apnea (adult) (pediatric): Secondary | ICD-10-CM | POA: Diagnosis not present

## 2016-08-28 DIAGNOSIS — R0602 Shortness of breath: Secondary | ICD-10-CM

## 2016-08-28 DIAGNOSIS — R5383 Other fatigue: Secondary | ICD-10-CM | POA: Diagnosis not present

## 2016-08-28 DIAGNOSIS — J984 Other disorders of lung: Secondary | ICD-10-CM

## 2016-08-28 DIAGNOSIS — G7000 Myasthenia gravis without (acute) exacerbation: Secondary | ICD-10-CM

## 2016-08-28 MED ORDER — IPRATROPIUM BROMIDE 0.02 % IN SOLN
0.5000 mg | Freq: Once | RESPIRATORY_TRACT | Status: AC
  Administered 2016-08-28: 0.5 mg via RESPIRATORY_TRACT

## 2016-08-28 MED ORDER — IPRATROPIUM-ALBUTEROL 0.5-2.5 (3) MG/3ML IN SOLN: 3.0000 mL | Freq: Once | RESPIRATORY_TRACT | Status: DC

## 2016-08-28 MED ORDER — ALBUTEROL SULFATE (2.5 MG/3ML) 0.083% IN NEBU
2.5000 mg | INHALATION_SOLUTION | Freq: Once | RESPIRATORY_TRACT | Status: AC
  Administered 2016-08-28: 2.5 mg via RESPIRATORY_TRACT

## 2016-08-28 NOTE — Progress Notes (Signed)
Subjective:    Patient ID: Frances Medina , female   DOB: [birth year] , 56 y.o..   MRN: [id number]  HPI  Frances Medina is a 56 yo female with PMH of myasthenia gravis, HTN, restrictive lung disease, and OSA here for a same day visit for shortness of breath   1. Shortness of breath: Patient notes that earlier today she out and about doing errands with her grandchildren when she noticed she was more short of breath. She denied any chest pain, palpitations, lightheadedness, dizziness, or cough. She notes that typically when this happens she will use her albuterol nebulizer that she has at home. Patient admits to having left lower extremity swelling x 1 month which she has been seen in the clinic for. DVT was ruled out at that time with a negative U/S. Of note, she is getting over a URI that started 1 week ago. Her symptoms have been a runny nose that has been getting progressively better. Is having some fatigue with recent URI.   Review of Systems: Per HPI.   Past Medical History: Patient Active Problem List   Diagnosis Date Noted  . Leg swelling 02/29/2016  . 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 Esophageal dysmotility 11/08/2015  . Encounter for routine gynecological examination 10/19/2015  . Urine incontinence 10/19/2015  . Restrictive lung disease 10/11/2015  . Headache 09/14/2015  . Numbness and tingling 09/14/2015  . Acute bronchitis 08/05/2015  . Headache, migraine   . HTN (hypertension), benign   . Shortness of breath   . Diplopia   . Dysphagia, neurologic   . Elevated LDH 05/06/2015  . Hypokalemia 04/07/2015  . Lymphadenopathy 04/07/2015  . Splenomegaly 04/07/2015  . Myasthenia gravis ([place])   . Osteoarthritis of left knee 12/02/2013  . Osteoarthritis of right knee 08/30/2013  . GERD (gastroesophageal reflux disease) 06/23/2013  . Obesity 06/23/2013  . Anxiety and depression 06/23/2013  . Fibroids 06/23/2013    Medications:  Current Outpatient Prescriptions    Medication Sig Dispense Refill  . albuterol (PROAIR HFA) 108 (90 Base) MCG/ACT inhaler Inhale 2 puffs into the lungs every 4 (four) hours as needed for wheezing or shortness of breath. 1 Inhaler 4  . albuterol (PROVENTIL) (2.5 MG/3ML) 0.083% nebulizer solution Take 3 mLs (2.5 mg total) by nebulization every 6 (six) hours as needed for shortness of breath. 150 mL 2  . Amino Acids-Protein Hydrolys (FEEDING SUPPLEMENT, PRO-STAT SUGAR FREE 64,) LIQD Place 30 mLs into feeding tube 2 (two) times daily. (Patient not taking: Reported on 07/08/2016) 900 mL 0  . clonazePAM (KLONOPIN) 1 MG tablet TAKE 1 TABLET BY MOUTH TWICE DAILY AS NEEDED FOR ANXIETY 60 tablet 2  . ferrous sulfate 325 (65 FE) MG tablet Take 1 tablet (325 mg total) by mouth 2 (two) times daily with a meal. 60 tablet 0  . furosemide (LASIX) 20 MG tablet TAKE 1 TABLET EVERY DAY 90 tablet 1  . guaiFENesin (ROBITUSSIN) 100 MG/5ML SOLN Take 15 mLs by mouth daily as needed for cough or to loosen phlegm.    [name] ibuprofen (ADVIL,MOTRIN) 200 MG tablet Take 800 mg by mouth as needed for mild pain. Reported on 09/14/2015    . iron polysaccharides (NU-IRON) 150 MG capsule Take 1 capsule (150 mg total) by mouth 2 (two) times daily. 60 capsule 2  . Menthol (COUGH DROPS) 10 MG LOZG Take 1 lozenge by mouth daily as needed (cough).    . nystatin cream (MYCOSTATIN) Apply 1 application topically 2 (two) times  daily. 30 g 0  . omeprazole (PRILOSEC) 20 MG capsule Take 1 capsule (20 mg total) by mouth daily. 90 capsule 1  . potassium chloride (KLOR-CON) 20 MEQ packet MIX 1 PACKET DAILY AS DIRECTED 30 packet 1  . predniSONE (DELTASONE) 2.5 MG tablet Take 1 tablet (2.5 mg total) by mouth daily with breakfast. 30 tablet 0  . pyridostigmine (MESTINON) 60 MG tablet Take 0.5 tablets (30 mg total) by mouth 3 (three) times daily. 120 tablet 0  . sertraline (ZOLOFT) 100 MG tablet TAKE 1 TABLET EVERY DAY 90 tablet 1   No current facility-administered medications for this  visit.     Social Hx:  reports that she quit smoking about 4 years ago. Her smoking use included Cigarettes. She started smoking about 42 years ago. She has a 19.00 pack-year smoking history. She has never used smokeless tobacco.   Objective:   BP 122/86   Pulse 90   Temp 97.8 F (36.6 C) (Oral)   Resp 20   Ht 5' (1.524 m)   Wt 297 lb (134.7 kg)   SpO2 98%   BMI 58.00 kg/m  Physical Exam  Gen: NAD, alert, cooperative with exam, morbidly obese, in no acute distress HEENT: NCAT, PERRL, clear conjunctiva, oropharynx clear, supple neck, clear nasal discharge bilaterally Cardiac: Regular rate and rhythm, normal S1/S2, no murmur, 1+ pitting edema in left leg, capillary refill brisk Respiratory: Clear to auscultation bilaterally, no wheezes, non-labored breathing, slightly tachypneic Gastrointestinal: soft, non tender, non distended, bowel sounds present Neurological: no gross deficits.  Psych: good insight, normal mood and affect  Assessment & Plan:  Shortness of breath Likely multifactorial with known history of restrictive lung disease and OSA. Symptoms improved after 1 Duoneb treatment in clinic. Less likely pneumonia and patient afebrile with clear lung sounds. Less likely PE as symptoms improved after 1 Duoneb. Reassuringly patient has normal work of breathing and no hypoxia. No signs of myasthenia gravis crisis/flair. Patient does have some swelling in left leg which has been going on for 1 month (DVT has been ruled out last month). Wonder if this is possibly due to new heart failure however that would be odd to just have swelling in 1 leg and not both. Discussed patient with Dr. Andria Frames and the plan is as follows:  - Use albuterol inhaler 2 puffs q 4 hours x 24 hours and then PRN - Will order BNP, BMP, CBC, TSH - Patient should see a pulmonology, will place a referral - Discussed return precautions and red flag symptoms  Orders Placed This Encounter  Procedures  . Brain  natriuretic peptide  . Basic Metabolic Panel  . CBC with Differential  . TSH   Meds ordered this encounter  Medications  . DISCONTD: ipratropium-albuterol (DUONEB) 0.5-2.5 (3) MG/3ML nebulizer solution 3 mL  . albuterol (PROVENTIL) (2.5 MG/3ML) 0.083% nebulizer solution 2.5 mg  . ipratropium (ATROVENT) nebulizer solution 0.5 mg    Smitty Cords, MD [place], PGY-2

## 2016-08-28 NOTE — Patient Instructions (Addendum)
Thank you for coming in today, it was so nice to see you! Today we talked about:    Shortness of breath: This is likely from the myasthenia gravis. We have given your breathing treatment while you're here. For the next 24 hours I would like you to use your albuterol inhaler every 4 hours.  Reasons to go to the hospital would be if you have shortness of breath or wheezing that is not better with her albuterol inhalers or if you have chest pain or fever  We have done blood work today to make sure there is nothing else going on  Please follow up in 1 week with your PCP. You can schedule this appointment at the front desk before you leave or call the clinic.  Bring in all your medications or supplements to each appointment for review.   If we ordered any tests today, you will be notified via telephone of any abnormalities. If everything is normal you will get a letter in the mail.   If you have any questions or concerns, please do not hesitate to call the office at [phone]. You can also message me directly via MyChart.   Sincerely,  Smitty Cords, MD

## 2016-08-28 NOTE — Telephone Encounter (Signed)
I am out on vacation. Please check if Dr. McDiarmid will be willing to call or. Otherwise I will see her at follow-up. Have her come in soon if having any concern.

## 2016-08-28 NOTE — Telephone Encounter (Signed)
Will forward to MD to advise. Jazmin Hartsell,CMA  

## 2016-08-28 NOTE — Telephone Encounter (Signed)
Patient is being seen today at 3:45pm with Dr. Juanito Doom. Taci Sterling,CMA

## 2016-08-28 NOTE — Telephone Encounter (Signed)
Patient requesting to speak to Dr. Gwendlyn Deutscher about her wheezing and SOB. She has made an appt to see her next week about would like to speak to her before then. Please advise.

## 2016-08-28 NOTE — Telephone Encounter (Signed)
I asked Frances Medina to come into office for evaluation given history of MG.

## 2016-08-29 LAB — CBC WITH DIFFERENTIAL/PLATELET
Basophils Absolute: 0 10*3/uL (ref 0.0–0.2)
Basos: 0 %
EOS (ABSOLUTE): 0.1 10*3/uL (ref 0.0–0.4)
Eos: 1 %
Hematocrit: 32.5 % — ABNORMAL LOW (ref 34.0–46.6)
Hemoglobin: 10.8 g/dL — ABNORMAL LOW (ref 11.1–15.9)
Immature Grans (Abs): 0 10*3/uL (ref 0.0–0.1)
Immature Granulocytes: 0 %
Lymphocytes Absolute: 1.9 10*3/uL (ref 0.7–3.1)
Lymphs: 20 %
MCH: 28.8 pg (ref 26.6–33.0)
MCHC: 33.2 g/dL (ref 31.5–35.7)
MCV: 87 fL (ref 79–97)
Monocytes Absolute: 0.7 10*3/uL (ref 0.1–0.9)
Monocytes: 7 %
Neutrophils Absolute: 6.7 10*3/uL (ref 1.4–7.0)
Neutrophils: 72 %
Platelets: 264 10*3/uL (ref 150–379)
RBC: 3.75 x10E6/uL — ABNORMAL LOW (ref 3.77–5.28)
RDW: 13.5 % (ref 12.3–15.4)
WBC: 9.4 10*3/uL (ref 3.4–10.8)

## 2016-08-29 LAB — BASIC METABOLIC PANEL
BUN/Creatinine Ratio: 16 (ref 9–23)
BUN: 12 mg/dL (ref 6–24)
CO2: 24 mmol/L (ref 20–29)
Calcium: 9.6 mg/dL (ref 8.7–10.2)
Chloride: 103 mmol/L (ref 96–106)
Creatinine, Ser: 0.76 mg/dL (ref 0.57–1.00)
GFR calc Af Amer: 102 mL/min/{1.73_m2} (ref >59)
GFR calc non Af Amer: 89 mL/min/{1.73_m2} (ref >59)
Glucose: 81 mg/dL (ref 65–99)
Potassium: 4.1 mmol/L (ref 3.5–5.2)
Sodium: 143 mmol/L (ref 134–144)

## 2016-08-29 LAB — BRAIN NATRIURETIC PEPTIDE: BNP: 13.4 pg/mL (ref 0.0–100.0)

## 2016-08-29 NOTE — Assessment & Plan Note (Addendum)
Likely multifactorial with known history of restrictive lung disease and OSA. Symptoms improved after 1 Duoneb treatment in clinic. Less likely pneumonia and patient afebrile with clear lung sounds. Less likely PE as symptoms improved after 1 Duoneb. Reassuringly patient has normal work of breathing and no hypoxia. No signs of myasthenia gravis crisis/flair. Patient does have some swelling in left leg which has been going on for 1 month (DVT has been ruled out last month). Wonder if this is possibly due to new heart failure however that would be odd to just have swelling in 1 leg and not both. Discussed patient with Dr. Andria Frames and the plan is as follows:  - Use albuterol inhaler 2 puffs q 4 hours x 24 hours and then PRN - Will order BNP, BMP, CBC, TSH - Patient should see a pulmonology, will place a referral - Discussed return precautions and red flag symptoms

## 2016-09-01 ENCOUNTER — Encounter: Payer: Self-pay | Admitting: Family Medicine

## 2016-09-02 ENCOUNTER — Ambulatory Visit (INDEPENDENT_AMBULATORY_CARE_PROVIDER_SITE_OTHER): Payer: BC Managed Care – PPO | Admitting: Family Medicine

## 2016-09-02 ENCOUNTER — Encounter: Payer: Self-pay | Admitting: Family Medicine

## 2016-09-02 ENCOUNTER — Telehealth: Payer: Self-pay | Admitting: Family Medicine

## 2016-09-02 VITALS — BP 128/82 | HR 89 | Temp 98.2°F | Ht 61.45 in | Wt 296.0 lb

## 2016-09-02 DIAGNOSIS — R062 Wheezing: Secondary | ICD-10-CM

## 2016-09-02 DIAGNOSIS — M7989 Other specified soft tissue disorders: Secondary | ICD-10-CM

## 2016-09-02 DIAGNOSIS — R05 Cough: Secondary | ICD-10-CM

## 2016-09-02 DIAGNOSIS — R0602 Shortness of breath: Secondary | ICD-10-CM | POA: Diagnosis not present

## 2016-09-02 DIAGNOSIS — R059 Cough, unspecified: Secondary | ICD-10-CM

## 2016-09-02 LAB — TSH: TSH: 1.66 u[IU]/mL (ref 0.450–4.500)

## 2016-09-02 LAB — SPECIMEN STATUS REPORT

## 2016-09-02 NOTE — Progress Notes (Signed)
Subjective:     Patient ID: Frances Medina, female   DOB: Dec 12, 1960, 56 y.o.   MRN: [id number]  Shortness of Breath  This is a new problem. The current episode started 1 to 4 weeks ago (Started 1 week ago). The problem occurs constantly. The problem has been unchanged. Associated symptoms include leg swelling and wheezing. Pertinent negatives include no chest pain, fever, orthopnea, PND or rhinorrhea. The symptoms are aggravated by exercise (Throat gets dry and she starts coughing and things worsening. Walking, Exercise and climbing stairs aggravates her symptoms.). Associated symptoms comments: Cough. She has tried beta agonist inhalers and OTC cough suppressants (CPAP helps, OTC allergy meds) for the symptoms. The treatment provided mild relief. Her past medical history is significant for allergies. There is no history of asthma or COPD.  Edema: C/L worsening B/L LL edema. She is compliant with Lasix 20 mg qd without any major improvement. Denies excessive salt in her diet. She will like to go up on her Lasix. Morbid Obesity: Patient stated she is compliant with diet management. She eats less fat and more vegetables. A lot of times her appetite is low and she does not eat often. She is uncertain why she is Changes diet  Current Outpatient Prescriptions on File Prior to Visit  Medication Sig Dispense Refill  . albuterol (PROAIR HFA) 108 (90 Base) MCG/ACT inhaler Inhale 2 puffs into the lungs every 4 (four) hours as needed for wheezing or shortness of breath. 1 Inhaler 4  . albuterol (PROVENTIL) (2.5 MG/3ML) 0.083% nebulizer solution Take 3 mLs (2.5 mg total) by nebulization every 6 (six) hours as needed for shortness of breath. 150 mL 2  . ferrous sulfate 325 (65 FE) MG tablet Take 1 tablet (325 mg total) by mouth 2 (two) times daily with a meal. 60 tablet 0  . furosemide (LASIX) 20 MG tablet TAKE 1 TABLET EVERY DAY 90 tablet 1  . guaiFENesin (ROBITUSSIN) 100 MG/5ML SOLN Take 15 mLs by mouth daily as  needed for cough or to loosen phlegm.    [name] ibuprofen (ADVIL,MOTRIN) 200 MG tablet Take 800 mg by mouth as needed for mild pain. Reported on 09/14/2015    . Menthol (COUGH DROPS) 10 MG LOZG Take 1 lozenge by mouth daily as needed (cough).    . nystatin cream (MYCOSTATIN) Apply 1 application topically 2 (two) times daily. 30 g 0  . omeprazole (PRILOSEC) 20 MG capsule Take 1 capsule (20 mg total) by mouth daily. 90 capsule 1  . potassium chloride (KLOR-CON) 20 MEQ packet MIX 1 PACKET DAILY AS DIRECTED 30 packet 1  . predniSONE (DELTASONE) 2.5 MG tablet Take 1 tablet (2.5 mg total) by mouth daily with breakfast. 30 tablet 0  . pyridostigmine (MESTINON) 60 MG tablet Take 0.5 tablets (30 mg total) by mouth 3 (three) times daily. 120 tablet 0  . sertraline (ZOLOFT) 100 MG tablet TAKE 1 TABLET EVERY DAY 90 tablet 1  . Amino Acids-Protein Hydrolys (FEEDING SUPPLEMENT, PRO-STAT SUGAR FREE 64,) LIQD Place 30 mLs into feeding tube 2 (two) times daily. (Patient not taking: Reported on 07/08/2016) 900 mL 0  . clonazePAM (KLONOPIN) 1 MG tablet TAKE 1 TABLET BY MOUTH TWICE DAILY AS NEEDED FOR ANXIETY 60 tablet 2   No current facility-administered medications on file prior to visit.    Past Medical History:  Diagnosis Date  . Acute respiratory failure ([place])   . Acute respiratory failure with hypoxemia ([place])   . Aspiration into airway   . CAP (  community acquired pneumonia) 08/05/2015  . Depression with anxiety   . Diverticulosis   . Dysphagia 03/2015    EGD, Dr Carlean Purl. mild antral gastritis, ? due to Ibuprofen.  no stricture but empirically maloney dilated esophagus.   . E. coli UTI 04/07/2015  . Encounter for routine gynecological examination 10/19/2015  . Endotracheally intubated   . Enteritis due to Clostridium difficile   . Fibroid uterus    size of a dime  . Gastrostomy infection ([place]) 11/08/2015  . GERD (gastroesophageal reflux disease)    hiatal hernia  . Hypertension    "went away when I stopped  smoking"  . Knee injury   . Migraine    "maybe couple times/month" (03/20/2015)  . Myasthenia gravis ([place]) 2017  . Myasthenia gravis with acute exacerbation ([place]) 05/15/2015  . Osteoarthritis of left knee 12/02/2013  . Osteoarthritis of right knee 08/30/2013  . Protein-calorie malnutrition, severe ([place]) 08/07/2015  . Seasonal allergies    takes Zytrec  . Tracheostomy status ([place])   . Tubular adenoma of colon   . Tumors    "in my stomach"  . Umbilical hernia    watching , no plans for surgery at present  . VAP (ventilator-associated pneumonia) ([place])    Vitals:   09/02/16 0856  BP: 128/82  Pulse: 89  Temp: 98.2 F (36.8 C)  TempSrc: Oral  SpO2: 95%  Weight: 296 lb (134.3 kg)  Height: 5' 1.45" (1.561 m)  Ambulatory O2 Sat = 97% Body mass index is 55.11 kg/m.    Review of Systems  Constitutional: Negative for fever.  HENT: Negative for rhinorrhea.   Respiratory: Positive for shortness of breath and wheezing.   Cardiovascular: Positive for leg swelling. Negative for chest pain, orthopnea and PND.  Gastrointestinal: Negative.   Genitourinary: Negative.   Musculoskeletal: Negative.   Neurological: Negative.   All other systems reviewed and are negative.      Objective:   Physical Exam  Constitutional: She is oriented to person, place, and time. She appears well-developed. No distress.  Cardiovascular: Normal rate, regular rhythm and normal heart sounds.   No murmur heard. Pulmonary/Chest: Effort normal and breath sounds normal. No respiratory distress. She has no wheezes. She exhibits no tenderness.  Abdominal: Soft. She exhibits no mass. There is no tenderness. There is no guarding.  Obese abdomen  Musculoskeletal: Normal range of motion. She exhibits edema.  ++ pedal swelling up to few inches below her left knee and just few inches above her right ankle  Neurological: She is alert and oriented to person, place, and time.  Nursing note and vitals reviewed.       Assessment:     Dyspnea with wheezing Pedal edema Morbid obesity    Plan:     Check problem list.

## 2016-09-02 NOTE — Assessment & Plan Note (Signed)
With wheezing. Likely multifactorial ?? Related to myasthenia gravis given that her steroid and Mestinon doses were recently reduced. I will defer further management to her neurologist. Recent PFT showed severe restrictive lung disease likely due to her obesity vs MG. ?? Worsening CHF with associated leg edema. Recent BNP was normal. I will recheck ECHO to reassess her LVEF. Continue albuterol as needed. She has pulmonology appointment scheduled already. Chest xray ordered today to R/O infection. Ambulatory O2 normal. Return precaution discussed.

## 2016-09-02 NOTE — Telephone Encounter (Signed)
Attempted to call Mrs. Leider to discuss her lab results. No answer. Left voicemail. Will send a letter in the mail with her results.   Smitty Cords, MD [facility], PGY-3

## 2016-09-02 NOTE — Patient Instructions (Addendum)
It was nice seeing you today. I am sorry about your symptoms. Let us recheck your ECHO. We will get an xray as well. Please take Lasix 40 mg twice a week and take 20 mg daily. Hold Lasix dose if BP is less than 100/60. F/U in 4 weeks.

## 2016-09-02 NOTE — Assessment & Plan Note (Signed)
Diet discussed. She seems to be doing well with her diet. Difficulty in controlling her weight is likely medication induced as she is chronically on steroid. We will continue diet management for now and exercise as tolerated.

## 2016-09-02 NOTE — Assessment & Plan Note (Signed)
Venous insufficiency vs CHF. BNP normal. However, she is also obese which can make it falsely normal. ECHO reordered. Reduce salt in diet, elevate LL and wear compression stocking. BP low normal today hence skeptical about reducing lasix dose. I recommended Lasix 40 mg only twice a week x 1 week and continue Lasix 20 mg daily. She is advised to hold Lasix dose if BP is less than 100/60 and contact me as soon as possible especially if she is symptomatic. She agreed with plan. ECHO report pending. Take Lasix

## 2016-09-03 ENCOUNTER — Emergency Department (HOSPITAL_COMMUNITY)
Admission: EM | Admit: 2016-09-03 | Discharge: 2016-09-03 | Discharge status: Against Medical Advice | Payer: BC Managed Care – PPO | Attending: Emergency Medicine | Admitting: Emergency Medicine

## 2016-09-03 ENCOUNTER — Encounter (HOSPITAL_COMMUNITY): Payer: Self-pay | Admitting: *Deleted

## 2016-09-03 ENCOUNTER — Emergency Department (HOSPITAL_COMMUNITY): Payer: BC Managed Care – PPO

## 2016-09-03 DIAGNOSIS — R05 Cough: Secondary | ICD-10-CM | POA: Diagnosis not present

## 2016-09-03 DIAGNOSIS — Z5321 Procedure and treatment not carried out due to patient leaving prior to being seen by health care provider: Secondary | ICD-10-CM | POA: Insufficient documentation

## 2016-09-03 DIAGNOSIS — R0602 Shortness of breath: Secondary | ICD-10-CM | POA: Diagnosis not present

## 2016-09-03 MED ORDER — ALBUTEROL SULFATE (2.5 MG/3ML) 0.083% IN NEBU: 5.0000 mg | INHALATION_SOLUTION | Freq: Once | RESPIRATORY_TRACT | Status: DC

## 2016-09-03 NOTE — ED Notes (Signed)
RN contacted transport about where patient was taken. Transport informed RN that patient transported back to waiting room and told them that she was leaving. Nurse first made aware.

## 2016-09-03 NOTE — ED Notes (Signed)
RN went to retrieve patient from waiting room. No answer. Contacted xray who states that she has already done her xray and should be back. Nurse first made aware.

## 2016-09-03 NOTE — ED Triage Notes (Signed)
States she was seen by her PCP yest for cough and sob; states she was suppose to come to the hospital for chest xray and testing yest however didn't come

## 2016-09-04 ENCOUNTER — Telehealth: Payer: Self-pay | Admitting: Family Medicine

## 2016-09-04 NOTE — Telephone Encounter (Signed)
  Xray result discussed with patient. Radiology report pretty vague. I compared this image with the last one done in Dec; looks similar with increase lung markings. Patient did not endorse worsening of her symptoms. I recommended repeat xray vs CT chest if symptoms persist or worsens. She stated she will keep me updated about how she feels. Return as needed.  Dg Chest 2 View  Result Date: 09/03/2016 CLINICAL DATA:  Cough and shortness of breath yesterday. EXAM: CHEST  2 VIEW COMPARISON:  02/27/2016 . Multiple old exams as distant as 03/22/2015 FINDINGS: There is chronic volume loss in both lower lobes that is chronic and probably represents scarring. Chronic atelectasis/ basilar atelectatic pneumonia is possible. Upper lungs are clear. Vascularity is normal. Heart size is normal. Mediastinal shadows are normal. No significant bone finding. IMPRESSION: Chronic abnormal density at both lung bases, present since January of 2017. Presumably this represents chronic basilar scarring/atelectasis. One could not rule out an element of active inflammation. Electronically Signed   By: Nelson Chimes M.D.   On: 09/03/2016 07:10

## 2016-09-11 ENCOUNTER — Ambulatory Visit (INDEPENDENT_AMBULATORY_CARE_PROVIDER_SITE_OTHER): Payer: BC Managed Care – PPO | Admitting: Pulmonary Disease

## 2016-09-11 ENCOUNTER — Encounter: Payer: Self-pay | Admitting: Pulmonary Disease

## 2016-09-11 VITALS — BP 118/72 | HR 85 | Ht 60.0 in | Wt 297.8 lb

## 2016-09-11 DIAGNOSIS — Z9989 Dependence on other enabling machines and devices: Secondary | ICD-10-CM

## 2016-09-11 DIAGNOSIS — R0600 Dyspnea, unspecified: Secondary | ICD-10-CM | POA: Diagnosis not present

## 2016-09-11 DIAGNOSIS — R059 Cough, unspecified: Secondary | ICD-10-CM | POA: Insufficient documentation

## 2016-09-11 DIAGNOSIS — K219 Gastro-esophageal reflux disease without esophagitis: Secondary | ICD-10-CM | POA: Diagnosis not present

## 2016-09-11 DIAGNOSIS — R05 Cough: Secondary | ICD-10-CM | POA: Diagnosis not present

## 2016-09-11 DIAGNOSIS — G4733 Obstructive sleep apnea (adult) (pediatric): Secondary | ICD-10-CM | POA: Diagnosis not present

## 2016-09-11 DIAGNOSIS — R0602 Shortness of breath: Secondary | ICD-10-CM | POA: Insufficient documentation

## 2016-09-11 MED ORDER — RANITIDINE HCL 150 MG PO TABS: 150.0000 mg | ORAL_TABLET | Freq: Every day | ORAL | 3 refills | Status: DC

## 2016-09-11 NOTE — Progress Notes (Signed)
Subjective:    Patient ID: Frances Medina, female    DOB: Sep 23, 1960, 56 y.o.   MRN: [id number]  C.C.:  Follow-up for Cough, Dyspnea, OSA, & GERD.  HPI Patient was last seen by me in March 2017 while hospitalized for a myasthenia gravis flare that resulted in respiratory failure. Patient underwent tracheostomy and PEG tube placement. These have since been removed & decannulated. She is following with [facility]. Her Mestinon and Prednisone have gradually been tapered.   Cough:  She reports she has been doing well. However, 3 weeks ago she developed a "cold". She started with sinus congestion & drainage. This has improved but she continues to have a cough. She isn't sure if the cough is coming from her post-nasal drainage. Laughing and sometimes eating seems to precipitate her cough. Her cough produces a clear mucus if anything. She does feel the Albuterol inhaler helps with her cough.   Dyspnea:  This only occurs after her coughing spells. She does have occasional wheezing. She also notices increased dyspnea with eating.   GERD:  She reports reflux with certain foods. She denies any morning brash water taste. She is taking Prilosec once daily. She is using Menthol lozenges to help with her cough.   OSA:  Started on CPAP in 2017. She reports her quality of sleep is much better.   Review of Systems She reports a poor appetite. Has to force herself to eat. Does drink excessive amounts of coffee. No fever or chills. Does have sweats with her menopause. No chest pain or pressure. No dysphagia or odynophagia.   Allergies  Allergen Reactions  . Ciprofloxacin Shortness Of Breath and Rash  . Shrimp [Shellfish Allergy] Hives and Shortness Of Breath    ER required  . Dicyclomine Hives  . Methocarbamol Hives  . Penicillins Hives    Has patient had a PCN reaction causing immediate rash, facial/tongue/throat swelling, SOB or lightheadedness with hypotension: Yes Has patient had  a PCN reaction causing severe rash involving mucus membranes or skin necrosis: No Has patient had a PCN reaction that required hospitalization No Has patient had a PCN reaction occurring within the last 10 years: No If all of the above answers are "NO", then may proceed with Cephalosporin use.     Current Outpatient Prescriptions on File Prior to Visit  Medication Sig Dispense Refill  . albuterol (PROAIR HFA) 108 (90 Base) MCG/ACT inhaler Inhale 2 puffs into the lungs every 4 (four) hours as needed for wheezing or shortness of breath. 1 Inhaler 4  . albuterol (PROVENTIL) (2.5 MG/3ML) 0.083% nebulizer solution Take 3 mLs (2.5 mg total) by nebulization every 6 (six) hours as needed for shortness of breath. 150 mL 2  . clonazePAM (KLONOPIN) 1 MG tablet TAKE 1 TABLET BY MOUTH TWICE DAILY AS NEEDED FOR ANXIETY 60 tablet 2  . ferrous sulfate 325 (65 FE) MG tablet Take 1 tablet (325 mg total) by mouth 2 (two) times daily with a meal. 60 tablet 0  . furosemide (LASIX) 20 MG tablet TAKE 1 TABLET EVERY DAY 90 tablet 1  . guaiFENesin (ROBITUSSIN) 100 MG/5ML SOLN Take 15 mLs by mouth daily as needed for cough or to loosen phlegm.    [name] ibuprofen (ADVIL,MOTRIN) 200 MG tablet Take 800 mg by mouth as needed for mild pain. Reported on 09/14/2015    . Menthol (COUGH DROPS) 10 MG LOZG Take 1 lozenge by mouth daily as needed (cough).    . nystatin cream (  MYCOSTATIN) Apply 1 application topically 2 (two) times daily. 30 g 0  . omeprazole (PRILOSEC) 20 MG capsule Take 1 capsule (20 mg total) by mouth daily. 90 capsule 1  . potassium chloride (KLOR-CON) 20 MEQ packet MIX 1 PACKET DAILY AS DIRECTED 30 packet 1  . predniSONE (DELTASONE) 2.5 MG tablet Take 1 tablet (2.5 mg total) by mouth daily with breakfast. 30 tablet 0  . pyridostigmine (MESTINON) 60 MG tablet Take 0.5 tablets (30 mg total) by mouth 3 (three) times daily. 120 tablet 0  . sertraline (ZOLOFT) 100 MG tablet TAKE 1 TABLET EVERY DAY 90 tablet 1  . Amino  Acids-Protein Hydrolys (FEEDING SUPPLEMENT, PRO-STAT SUGAR FREE 64,) LIQD Place 30 mLs into feeding tube 2 (two) times daily. (Patient not taking: Reported on 09/11/2016) 900 mL 0   No current facility-administered medications on file prior to visit.     Past Medical History:  Diagnosis Date  . Acute respiratory failure ([place])   . Acute respiratory failure with hypoxemia ([place])   . Aspiration into airway   . CAP (community acquired pneumonia) 08/05/2015  . Depression with anxiety   . Diverticulosis   . Dysphagia 03/2015    EGD, Dr Carlean Purl. mild antral gastritis, ? due to Ibuprofen.  no stricture but empirically maloney dilated esophagus.   . E. coli UTI 04/07/2015  . Encounter for routine gynecological examination 10/19/2015  . Endotracheally intubated   . Enteritis due to Clostridium difficile   . Fibroid uterus    size of a dime  . Gastrostomy infection ([place]) 11/08/2015  . GERD (gastroesophageal reflux disease)    hiatal hernia  . Hypertension    "went away when I stopped smoking"  . Knee injury   . Migraine    "maybe couple times/month" (03/20/2015)  . Myasthenia gravis ([place]) 2017  . Myasthenia gravis with acute exacerbation ([place]) 05/15/2015  . Osteoarthritis of left knee 12/02/2013  . Osteoarthritis of right knee 08/30/2013  . Protein-calorie malnutrition, severe ([place]) 08/07/2015  . Seasonal allergies    takes Zytrec  . Tracheostomy status ([place])   . Tubular adenoma of colon   . Tumors    "in my stomach"  . Umbilical hernia    watching , no plans for surgery at present  . VAP (ventilator-associated pneumonia) [facility])     Past Surgical History:  Procedure Laterality Date  . CESAREAN SECTION  1987; 1989  . COLONOSCOPY WITH PROPOFOL N/A 09/19/2013   Procedure: COLONOSCOPY WITH PROPOFOL;  Surgeon: Ladene Artist, MD;  Location: [facility] ENDOSCOPY;  Service: Endoscopy;  Laterality: N/A;  . DILATION AND CURETTAGE OF UTERUS    . ESOPHAGOGASTRODUODENOSCOPY N/A 08/10/2015   Procedure:  ESOPHAGOGASTRODUODENOSCOPY (EGD);  Surgeon: Gatha Mayer, MD;  Location[facility] ENDOSCOPY;  Service: Endoscopy;  Laterality: N/A;  . ESOPHAGOGASTRODUODENOSCOPY (EGD) WITH PROPOFOL N/A 03/21/2015   Procedure: ESOPHAGOGASTRODUODENOSCOPY (EGD) WITH PROPOFOL;  Surgeon: Gatha Mayer, MD;  Location: [place];  Service: Endoscopy;  Laterality: N/A;  . PARTIAL KNEE ARTHROPLASTY Right 08/30/2013   Procedure: RIGHT UNICOMPARTMENTAL KNEE;  Surgeon: Johnny Bridge, MD;  Location: [place];  Service: Orthopedics;  Laterality: Right;  . PARTIAL KNEE ARTHROPLASTY Left 12/02/2013   Procedure: LEFT KNEE UNI ARTHROPLASTY;  Surgeon: Johnny Bridge, MD;  Location: [place];  Service: Orthopedics;  Laterality: Left;  . PEG PLACEMENT N/A 08/10/2015   Procedure: PERCUTANEOUS ENDOSCOPIC GASTROSTOMY (PEG) PLACEMENT;  Surgeon: Gatha Mayer, MD;  Location: [place];  Service: Endoscopy;  Laterality: N/A;  .  TUBAL LIGATION  1989  . VAGINAL HYSTERECTOMY  1990's?   "apparently took out one of my ovaries at the time too cause one's missing"    Family History  Problem Relation Age of Onset  . Heart disease Mother 49  . Hypertension Mother   . Stroke Father   . Hypertension Father   . Other Sister        Esophageal strictures  . Anemia Sister   . COPD Brother   . Leukemia Sister   . Leukemia Sister   . Colon cancer Neg Hx     Social History   Social History  . Marital status: Married    Spouse name: N/A  . Number of children: 3  . Years of education: N/A   Occupational History  . disabled    Social History Main Topics  . Smoking status: Former Smoker    Packs/day: 0.50    Years: 38.00    Types: Cigarettes    Start date: 03/03/1974    Quit date: 05/14/2012  . Smokeless tobacco: Never Used     Comment: denies thoughts of restarting  . Alcohol use No  . Drug use: No  . Sexual activity: Not Currently    Birth control/ protection: Surgical   Other Topics Concern  . None   Social  History Narrative   Work or School: [employer] Situation: lives with husband and daughters      Spiritual Beliefs: Christian      Lifestyle: no regular exercise; trying to eat healthy      [facility] (09/11/16):   Originally from [facility]. Has always lived in [place]. No pets currently. Does have carpet in her home in her bedroom. No bird exposure. Previously had mold under her kitchen sink. No indoor plants. Previously was working in [employer].                Objective:   Physical Exam BP 118/72 (BP Location: Right Arm, Patient Position: Sitting, Cuff Size: Large)   Pulse 85   Ht 5' (1.524 m)   Wt 297 lb 12.8 oz (135.1 kg)   SpO2 96%   BMI 58.16 kg/m  General:  Awake. Alert. No acute distress. Morbidly obese. Integument:  Warm & dry. No rash on exposed skin.  Extremities:  No cyanosis or clubbing.  HEENT:  Moist mucus membranes. No oral ulcers. Minimal nasal turbinate swelling. No scleral icterus. Cardiovascular:  Regular rate. Pitting lower extremity edema. No appreciable JVD given body habitus.  Pulmonary:  Good aeration & clear to auscultation bilaterally. Symmetric chest wall expansion. No accessory muscle use on room air. Abdomen: Soft. Normal bowel sounds. Protuberant. Musculoskeletal:  Normal bulk and tone. No joint deformity or effusion appreciated.  PFT 08/24/15: FVC 1.20 L (50%) FEV1 0.94 L (48%) FEV1/FVC 0.78 FEF 25-75 0.85 L (38%) negative bronchodilator response  POLYSOMNOGRAM (11/19/2015): IMPRESSIONS - Mild obstructive sleep apnea occurred during this study (AHI = 10.0/h). - Insufficient early events to meet protocol requirement for split CPAP titration - No significant central sleep apnea occurred during this study (CAI = 0.0/h). - Mild oxygen desaturation was noted during this study (Min O2 = 86.00%). - The patient snored with Loud snoring volume. - No cardiac abnormalities were noted during this study. -  Clinically significant periodic limb movements did not occur during sleep. No significant associated arousals.  CPAP TITRATION (12/15/15):  Optimal CPAP pressure 14 cm H2O with moderate snoring.  Central sleep apnea was not noted during titration. Moderate oxygen desaturation to 83%. No cardiac abnormalities observed. No clinically significant periodic limb movements either. Previous polysomnogram in September  IMAGING CXR PA/LAT 09/03/16 (personally reviewed by me):  Mild silhouetting of bilateral hemidiaphragms with low lung volumes. This is consistent with the atelectasis seen on previous abdominal and chest CT imaging by my review. No parenchymal mass or opacity appreciated. Low lung volumes. No pleural effusion. Heart normal in size & mediastinum normal in contour.  CARDIAC TTE (04/09/15):  LV normal in size with EF 60-65%. Normal regional wall motion. Grade 1 diastolic dysfunction. LA moderately dilated & RA normal in size. No aortic stenosis or regurgitation. Aortic root normal in size. No mitral stenosis or regurgitation. No pulmonic regurgitation. Mild tricuspid regurgitation. No pericardial effusion.    Assessment & Plan:  56 y.o. female with dyspnea and cough that is likely multifactorial in etiology. Certainly her weight could be contributing to her dyspnea on exertion. Her cough is likely secondary to an acute viral illness. Reviewing her x-ray showed no evidence of pneumonia. Interestingly, her previous spirometry showed no evidence of airway obstruction but this was likely masked by the reduction in her FVC. She is adherent to her CPAP therapy. We discussed her diet at length which includes an excessive amount of caffeine through coffee and likely be causing some silent laryngo-esophageal reflux. I instructed the patient contact my office if she had any new breathing problems or questions before her next appointment.  1. Cough/dyspnea: Likely multifactorial. Checking 6 minute walk test on room  air & full pulmonary function testing before next appointment. 2. OSA: Currently adherent to CPAP therapy. Continuing indefinitely. 3. GERD: Patient counseled on appropriate dietary and lifestyle modification. Continuing Prilosec. Starting Zantac 100 mg by mouth daily at bedtime. 4. Follow-up: Patient to return to clinic in 6 weeks or sooner if needed.  Sonia Baller Ashok Cordia, M.D. [facility] Pager:  [phone] After 3pm or if no response, call [phone] 10:09 AM 09/11/16

## 2016-09-11 NOTE — Addendum Note (Signed)
Addended by: Tyson Dense on: 09/11/2016 10:13 AM   Modules accepted: Orders

## 2016-09-11 NOTE — Patient Instructions (Signed)
   Continue taking your Prilosec and other medications as prescribed.  I am starting you on Zantac at night to help with any silent reflux/heartburn you may be having.  Eliminate any coffee after noon - only water. Also avoid eating within 2-3 hours of bedtime.  Call or e-mail me if you have any new breathing problems, questions or feel your cough is getting worse.  TESTS ORDERED: 1. Full PFTs before next appointment 2. 6MWT on room air before next appointment

## 2016-09-15 ENCOUNTER — Ambulatory Visit (HOSPITAL_COMMUNITY)
Admission: RE | Admit: 2016-09-15 | Discharge: 2016-09-15 | Disposition: A | Discharge status: Home / Self Care | Payer: BC Managed Care – PPO | Source: Ambulatory Visit | Attending: Family Medicine | Admitting: Family Medicine

## 2016-09-15 DIAGNOSIS — R05 Cough: Secondary | ICD-10-CM

## 2016-09-15 DIAGNOSIS — R0602 Shortness of breath: Secondary | ICD-10-CM | POA: Diagnosis not present

## 2016-09-15 DIAGNOSIS — Z87891 Personal history of nicotine dependence: Secondary | ICD-10-CM | POA: Diagnosis not present

## 2016-09-15 DIAGNOSIS — R062 Wheezing: Secondary | ICD-10-CM | POA: Insufficient documentation

## 2016-09-15 DIAGNOSIS — R059 Cough, unspecified: Secondary | ICD-10-CM

## 2016-09-15 DIAGNOSIS — I10 Essential (primary) hypertension: Secondary | ICD-10-CM | POA: Diagnosis not present

## 2016-09-15 NOTE — Progress Notes (Signed)
  Echocardiogram 2D Echocardiogram has been performed.  Jennette Dubin 09/15/2016, 9:01 AM

## 2016-09-16 ENCOUNTER — Telehealth: Payer: Self-pay | Admitting: *Deleted

## 2016-09-16 ENCOUNTER — Encounter: Payer: Self-pay | Admitting: Family Medicine

## 2016-09-16 NOTE — Telephone Encounter (Signed)
Patient is aware of normal echo. Jazmin Hartsell,CMA

## 2016-09-16 NOTE — Telephone Encounter (Signed)
-----   Message from [name], MD sent at 09/16/2016  9:10 AM EDT ----- Please advise patient that her ECHO is normal. F/U as needed.

## 2016-09-29 ENCOUNTER — Ambulatory Visit (INDEPENDENT_AMBULATORY_CARE_PROVIDER_SITE_OTHER): Payer: BC Managed Care – PPO | Admitting: Family Medicine

## 2016-09-29 ENCOUNTER — Encounter: Payer: Self-pay | Admitting: Family Medicine

## 2016-09-29 NOTE — Patient Instructions (Addendum)
Goals:  1. Take the 14 steps at home more than once a day, as follows:  Weeks 1 & 2 (starting July 29): Take the stairs 2 X day.   Weeks 3 & 4 (starting Aug 12): Take the stairs 2 X day every day except MWF, take 3 X day.    Week 5 and beyond (starting Aug 26): Take the stairs 3 X day 5 days a week and 2 X day on 2 days a week.      Track your progress on your Goals Sheet, provided today.    2. Eat at least 4 times each day.  Coffee and water do not count.  For example:      - Breakfast: 1 egg, 1 slice toast, 1 cup coffee.      - Lunch: small salad with tuna or chicken or [place] or any cooked meat.   - Dinner: meat, vegetable, and starch; for example: grilled chicken, baked sweet potato, any frozen veg microwaved.  (Pour veg's into bowl, and cover with a plate - not plastic wrap - and cook on high.)   - Other starch sources: Rice, pasta, bread, corn, white potatoes, crackers.  All of these foods are fine to eat; you just want to be careful with portion size.  Limit scoopable carb sources to 1 full cup per meal.     - Meats or fish: A good portion size is usually equal to the palm of your hand.     - Veg's portion size: You can't overdo veg's, but right now you may find you do not tolerate large portions.   - Snacks: If you feel hungry, EAT.  Otherwise, plan on about 4-5 hours between eating.  If meals are delayed longer than this, have a snack, e.g., fruit, apple sauce, or yogurt.  Probably a good eating schedule for you will be Breakfast, Lunch, afternoon snack, Dinner (no later than 8 PM).    3. MOVE at least 10-15 minutes after each meal, especially lunch and dinner.    Complete goals sheet, and bring to follow-up on August 28 at 10 AM.    PORTION SIZES:  AS YOU START WORKING ON THESE GOALS, START WITH SMALL AMOUNTS OF FOOD, AND SEE HOW YOU DO.  INCREASE AS YOU ARE ABLE.       Making this work wil require planning ahead, for example yesterday's options:  - Pack a lunch.  - Explore other  options besides [employer]  - Other choices at [employer], such as salad and small roast beef sandwich (with no fries or soda).

## 2016-09-29 NOTE — Progress Notes (Signed)
Medical Nutrition Therapy:  Appt start time: 1400 end time:  1500.  Assessment:  Primary concerns today: Weight management, E66.01 (BMI 50 or higher).  Ms. Rothery said that soon after her nutrition appt June 21, she started to have problems following eating with shortness of  breath, and pain in her side.  This went on for about 3 weeks.  Since her G-tube was taken out, she feels like the food just doesn't digest well, so she started consuming just crackers, some fruit, and about 4 cups of black coffee most days.  Had no SOB yesterday, even after [employer] meal, however, but did have some after climbing 14 steps at home.  No discomfort or SOB after a meal of a bacon, cheese, and roast beef sandwich with fries and a soda suggests this is not simple reflux.   Discussed the value of small, frequent meals rather than pattern such as yesterday.  Patient was adamant that she CAN and WILL follow today's recommendations, that she is motivated especially seeing today's weight of 300.6 lb.    Still experiencing swelling of left lower leg.    Ms. Druck is sleeping much better (8-9 hrs/night) now that she has changed the filters in her C-PAP machine.    Learning Readiness: Ready  Usual eating pattern includes eating 1 X day, with occasional snacks/drinks. Recent physical activity includes none.    24-hr recall:  (Up at 6 AM) B (6:30 AM)-  1 1/2 c coffee, 2 tsp sugar, 2 tsp powdered creamer per cup Snk ( AM)-  16 oz water L (3:45 PM)-  1 [employer] rb sandw w/ chs&bacon, med fries, 16 oz soda Snk ( PM)-  --- D ( PM)-  --- Snk (10 PM)-  16 oz water Typical day? Yes.      Progress Towards Goal(s):  In progress.   Nutritional Diagnosis: No progress on  [place]-3.3 Overweight/obesity As related to energy balance.  As evidenced by weight gain of 4 lb in 5 weeks.    Intervention:  Nutrition education  Handouts given during visit include:  AVS  Demonstrated degree of understanding via:  Teach Back  Barriers  to learning/adherence to lifestyle change: poor mobility and minimal physical activity related to joint pain and fatigue.    Monitoring/Evaluation:  Dietary intake, exercise, and body weight in 4 week(s).

## 2016-09-30 IMAGING — CT CT ANGIO CHEST
2 of 8 series · 18 of 46 positions shown · IV contrast (APPLIED)
Comparison: 03/22/2015

CLINICAL DATA: SHOB AND SOME LEFT SIDED CHEST PAIN SINCE THIS AM
4844 RASH TO BACK AND ARM WITH RECENT ANTIBIOTIC STARTED [REDACTED]
PMH: HTN, GERD (gastroesophageal reflux disease); Tumors; Knee
injury; Depression

EXAM:
CT ANGIOGRAPHY CHEST WITH CONTRAST
TECHNIQUE: Multidetector CT imaging of the chest was performed using the
standard protocol during bolus administration of intravenous
contrast. Multiplanar CT image reconstructions and MIPs were
obtained to evaluate the vascular anatomy.
CONTRAST:  80mL OMNIPAQUE IOHEXOL 350 MG/ML SOLN

[Series 5: thins · axial · 0.58mm/px · z∈[-286,-75]mm · 15 of 233 slices shown]
[im 11/233  lung]
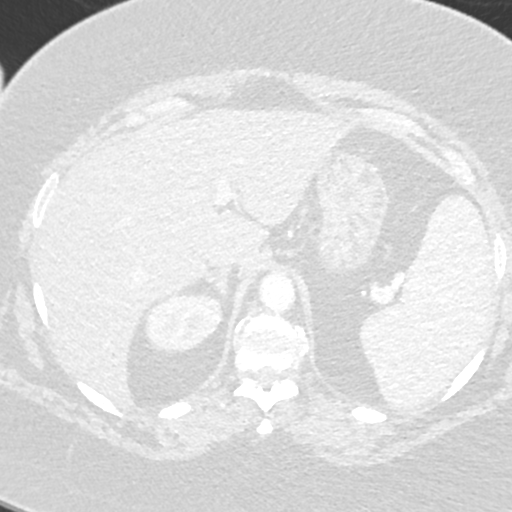
[im 32/233  soft-tissue]
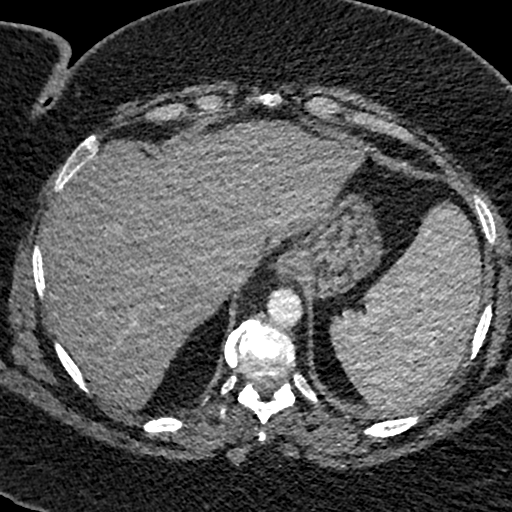
[im 43/233  lung]
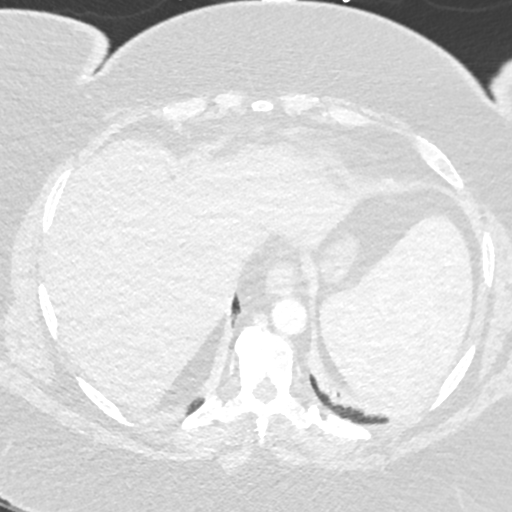
[im 53/233  soft-tissue]
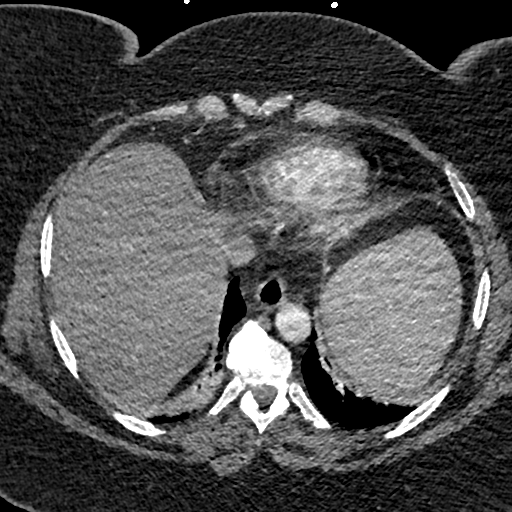
[im 74/233  lung]
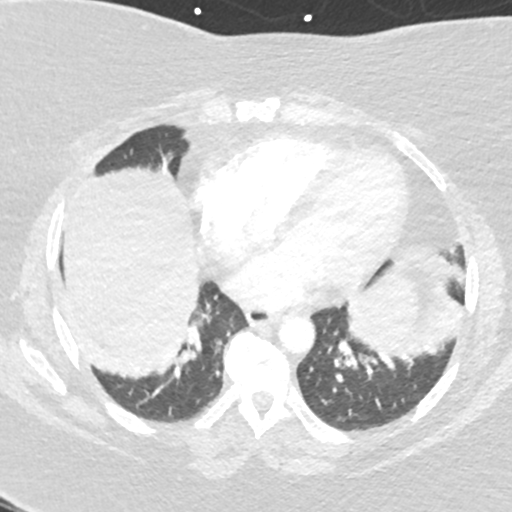
[im 85/233  soft-tissue]
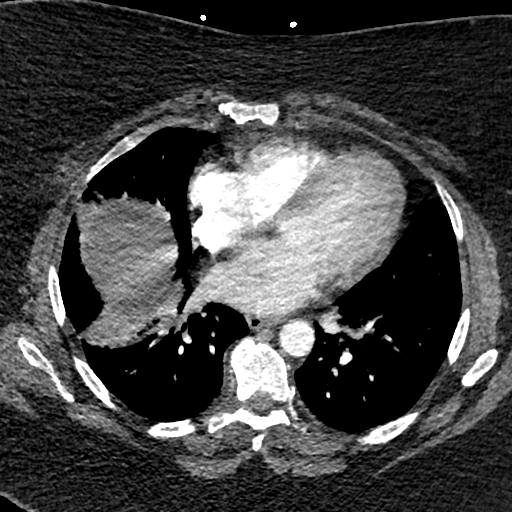
[im 106/233  lung]
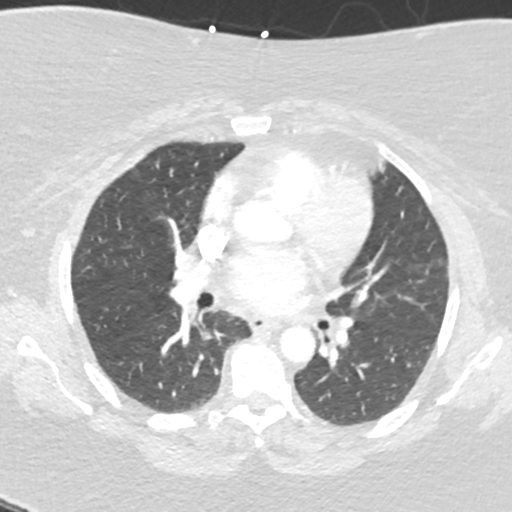
[im 117/233  soft-tissue]
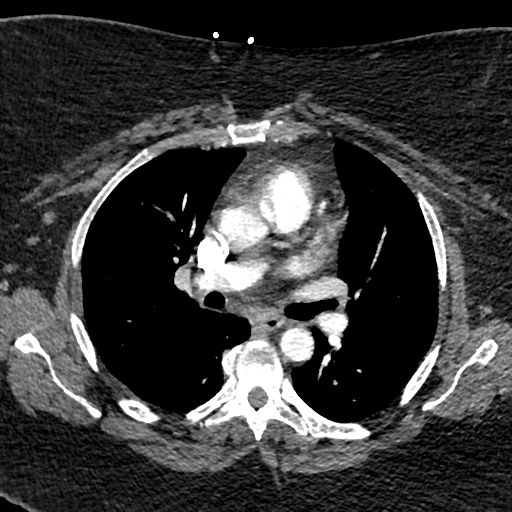
[im 127/233  lung]
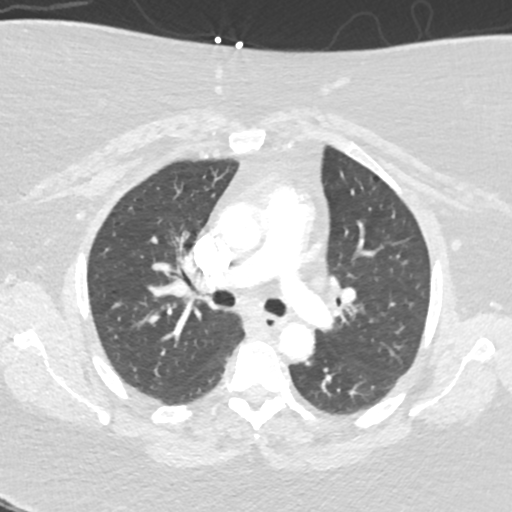
[im 148/233  soft-tissue]
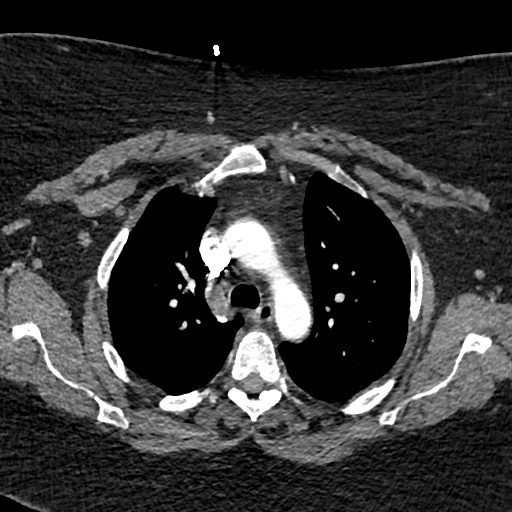
[im 159/233  lung]
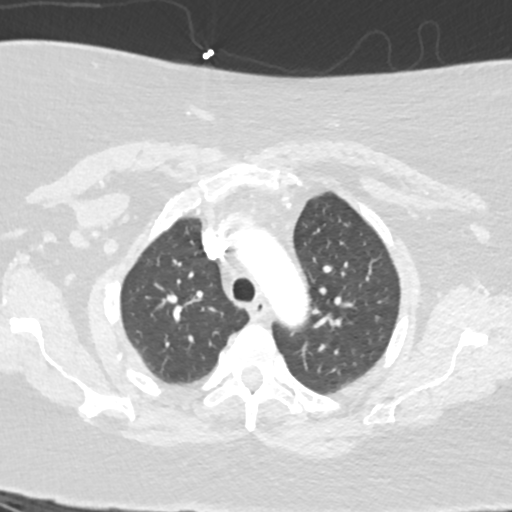
[im 180/233  soft-tissue]
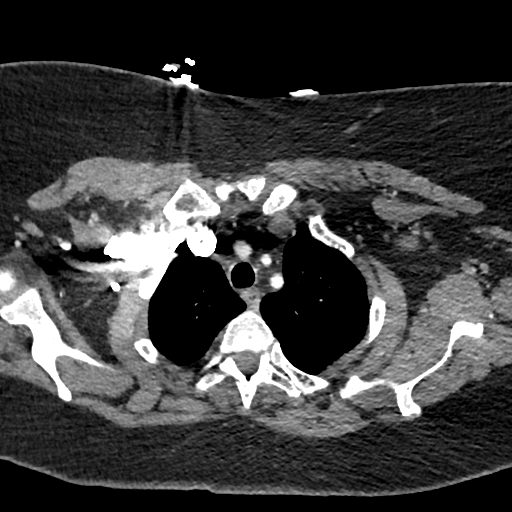
[im 190/233  lung]
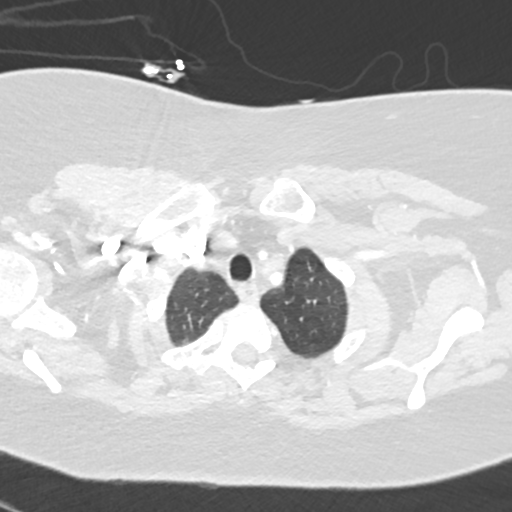
[im 201/233  soft-tissue]
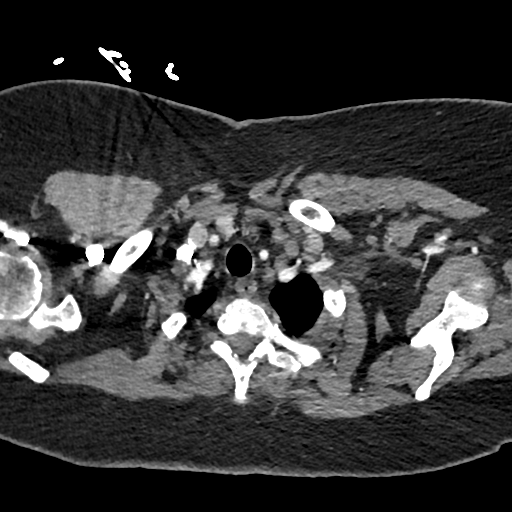
[im 222/233  lung]
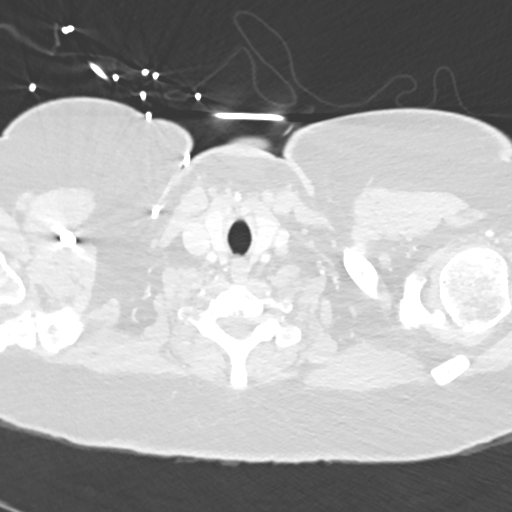

[Series 7: coronal mpr · coronal · 0.49mm/px · 3 of 121 slices shown]
[im 31/121  soft-tissue]
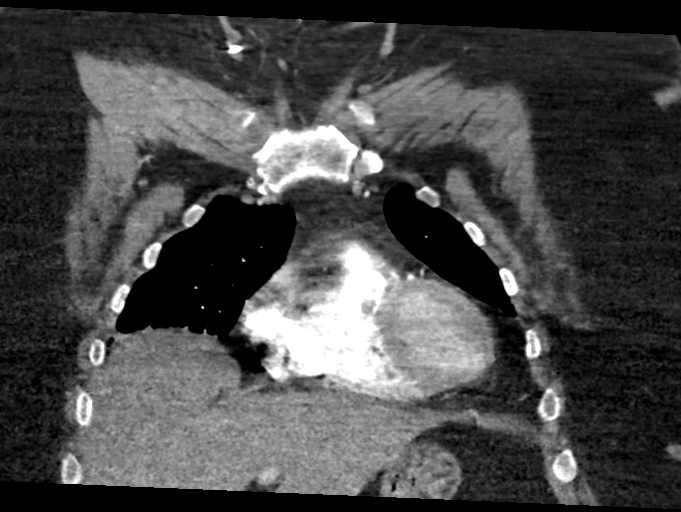
[im 61/121  soft-tissue]
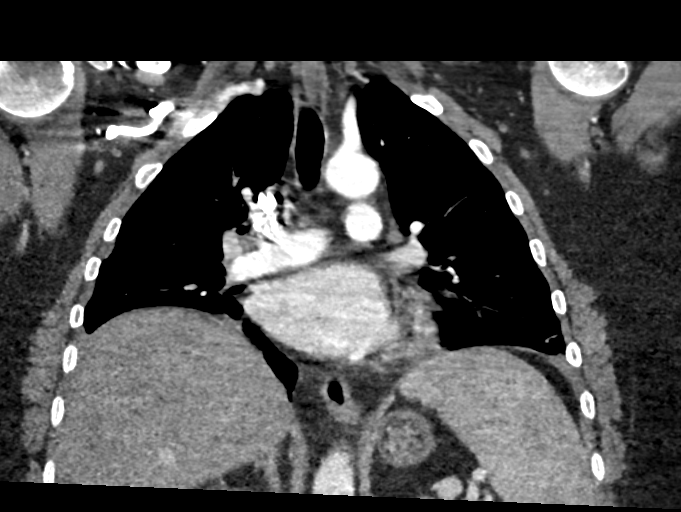
[im 91/121  soft-tissue]
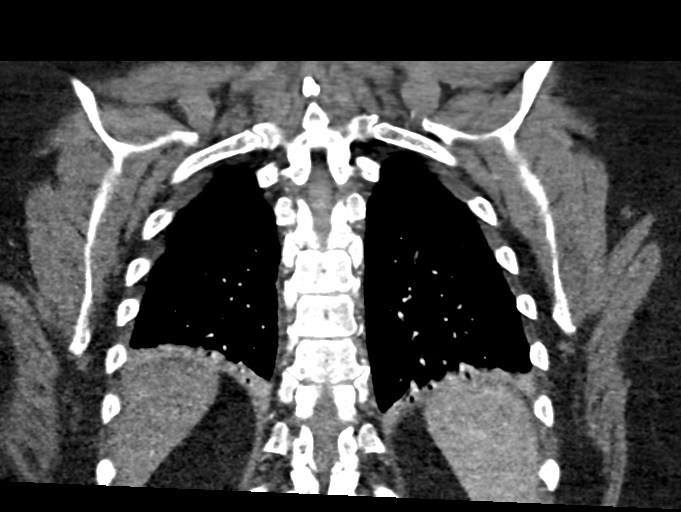

[18 of 46 positions shown; findings below may reference images not displayed]

FINDINGS: Right arm IV contrast injection. The SVC is patent. Right atrium
nondilated. RV is nondilated. Satisfactory opacification of
pulmonary arteries noted, and there is no evidence of pulmonary
emboli. Patent pulmonary veins. Adequate contrast opacification of
the thoracic aorta with no evidence of dissection, aneurysm, or
stenosis. There is classic 3-vessel brachiocephalic arch anatomy
without proximal stenosis.

No pleural or pericardial effusion. Prominent axillary, left
cervical level 4 and supraclavicular lymph nodes, new since prior
study.

Subsegmental atelectasis in basilar segments of both lower lobes.

Flowing osteophytes across several contiguous levels in the lower
thoracic spine. Sternum intact.

Borderline splenomegaly suspected, new. Remainder visualized
portions of upper abdomen unremarkable.

Review of the MIP images confirms the above findings.
IMPRESSION: 1. Negative for acute PE or thoracic aortic dissection.
2. Progressive splenomegaly as well as prominence of bilateral
axillary, left subclavian and cervical lymph nodes, possibly
reactive but nonspecific. Follow-up suggested to exclude neoplasm or
lymphoproliferative process.

## 2016-10-24 ENCOUNTER — Ambulatory Visit (INDEPENDENT_AMBULATORY_CARE_PROVIDER_SITE_OTHER): Payer: BC Managed Care – PPO | Admitting: *Deleted

## 2016-10-24 DIAGNOSIS — R0600 Dyspnea, unspecified: Secondary | ICD-10-CM | POA: Diagnosis not present

## 2016-10-24 NOTE — Progress Notes (Signed)
SIX MIN WALK 10/24/2016  Medications all meds @ 630 am today  Supplimental Oxygen during Test? (L/min) No  Laps 6  Partial Lap (in Meters) 0  Baseline BP (sitting) 124/80  Baseline Heartrate 68  Baseline Dyspnea (Borg Scale) 2  Baseline Fatigue (Borg Scale) 10  Baseline SPO2 100  BP (sitting) 160/94  Heartrate 125  Dyspnea (Borg Scale) 10  Fatigue (Borg Scale) 10  SPO2 97  BP (sitting) 142/80  Heartrate 78  SPO2 100  Stopped or Paused before Six Minutes No  Interpretation Hip pain  Distance Completed 288  Tech Comments: pt did c/o hip pain with walking.  no other complaints.  pt completed the test without difficulty

## 2016-10-27 ENCOUNTER — Ambulatory Visit (INDEPENDENT_AMBULATORY_CARE_PROVIDER_SITE_OTHER): Payer: BC Managed Care – PPO | Admitting: Pulmonary Disease

## 2016-10-27 ENCOUNTER — Encounter: Payer: Self-pay | Admitting: Pulmonary Disease

## 2016-10-27 VITALS — BP 122/82 | HR 72 | Ht 60.0 in | Wt 295.0 lb

## 2016-10-27 DIAGNOSIS — G4733 Obstructive sleep apnea (adult) (pediatric): Secondary | ICD-10-CM

## 2016-10-27 DIAGNOSIS — R059 Cough, unspecified: Secondary | ICD-10-CM

## 2016-10-27 DIAGNOSIS — Z9989 Dependence on other enabling machines and devices: Secondary | ICD-10-CM | POA: Diagnosis not present

## 2016-10-27 DIAGNOSIS — Z23 Encounter for immunization: Secondary | ICD-10-CM | POA: Diagnosis not present

## 2016-10-27 DIAGNOSIS — R05 Cough: Secondary | ICD-10-CM

## 2016-10-27 DIAGNOSIS — J4520 Mild intermittent asthma, uncomplicated: Secondary | ICD-10-CM

## 2016-10-27 DIAGNOSIS — R0600 Dyspnea, unspecified: Secondary | ICD-10-CM

## 2016-10-27 LAB — PULMONARY FUNCTION TEST
DL/VA % pred: 131 %
DL/VA: 5.57 ml/min/mmHg/L
DLCO cor % pred: 82 %
DLCO cor: 15.64 ml/min/mmHg
DLCO unc % pred: 84 %
DLCO unc: 15.92 ml/min/mmHg
FEF 25-75 Post: 3.06 L/sec
FEF 25-75 Pre: 1.56 L/sec
FEF2575-%Change-Post: 96 %
FEF2575-%Pred-Post: 155 %
FEF2575-%Pred-Pre: 79 %
FEV1-%Change-Post: 17 %
FEV1-%Pred-Post: 82 %
FEV1-%Pred-Pre: 70 %
FEV1-Post: 1.52 L
FEV1-Pre: 1.3 L
FEV1FVC-%Change-Post: 10 %
FEV1FVC-%Pred-Pre: 103 %
FEV6-%Change-Post: 4 %
FEV6-%Pred-Post: 72 %
FEV6-%Pred-Pre: 69 %
FEV6-Post: 1.64 L
FEV6-Pre: 1.56 L
FEV6FVC-%Change-Post: 0 %
FEV6FVC-%Pred-Post: 103 %
FEV6FVC-%Pred-Pre: 102 %
FVC-%Change-Post: 5 %
FVC-%Pred-Post: 70 %
FVC-%Pred-Pre: 66 %
FVC-Post: 1.66 L
FVC-Pre: 1.57 L
Post FEV1/FVC ratio: 92 %
Post FEV6/FVC ratio: 100 %
Pre FEV1/FVC ratio: 83 %
Pre FEV6/FVC Ratio: 100 %
RV % pred: 100 %
RV: 1.71 L
TLC % pred: 78 %
TLC: 3.49 L

## 2016-10-27 NOTE — Patient Instructions (Signed)
   Call our office if you start using your Albuterol inhaler daily or you have any new breathing problems before your next appointment.  We will see you back in 1 year or sooner if needed.

## 2016-10-27 NOTE — Addendum Note (Signed)
Addended by: Len Blalock on: 10/27/2016 12:21 PM   Modules accepted: Orders

## 2016-10-27 NOTE — Progress Notes (Signed)
Subjective:    Patient ID: Frances Medina, female    DOB: [birth year], 56 y.o.   MRN: [id number]  C.C.:  Follow-up for Cough/Dyspnea, OSA, & GERD.  HPI Cough/Dyspnea:  Likely multifactorial. Question possible asthmatic component given significant bronchodilator response today on pulmonary function testing. She reports Robitussin seems to help with her cough. She reports the Albuterol inhaler does help with both her cough & dyspnea. She is using her rescue medication rarely. She does wheeze with her cough.   OSA:  On CPAP since 2017. She is seeing a nutritionist to help with her weight loss. Compliant with CPAP therapy. Reports good quality of sleep.   GERD:  Started on Zantac qhs at last appointment while continuing Prilosec.  No reflux, dyspepsia, or morning brash water taste.   Review of Systems No chest pain or pressure. No chest tightness. No fever or chills. No rashes or bruising.   Allergies  Allergen Reactions  . Ciprofloxacin Shortness Of Breath and Rash  . Shrimp [Shellfish Allergy] Hives and Shortness Of Breath    ER required  . Dicyclomine Hives  . Methocarbamol Hives  . Penicillins Hives    Has patient had a PCN reaction causing immediate rash, facial/tongue/throat swelling, SOB or lightheadedness with hypotension: Yes Has patient had a PCN reaction causing severe rash involving mucus membranes or skin necrosis: No Has patient had a PCN reaction that required hospitalization No Has patient had a PCN reaction occurring within the last 10 years: No If all of the above answers are "NO", then may proceed with Cephalosporin use.     Current Outpatient Prescriptions on File Prior to Visit  Medication Sig Dispense Refill  . albuterol (PROAIR HFA) 108 (90 Base) MCG/ACT inhaler Inhale 2 puffs into the lungs every 4 (four) hours as needed for wheezing or shortness of breath. 1 Inhaler 4  . albuterol (PROVENTIL) (2.5 MG/3ML) 0.083% nebulizer solution Take 3 mLs (2.5 mg total) by  nebulization every 6 (six) hours as needed for shortness of breath. 150 mL 2  . Amino Acids-Protein Hydrolys (FEEDING SUPPLEMENT, PRO-STAT SUGAR FREE 64,) LIQD Place 30 mLs into feeding tube 2 (two) times daily. 900 mL 0  . clonazePAM (KLONOPIN) 1 MG tablet TAKE 1 TABLET BY MOUTH TWICE DAILY AS NEEDED FOR ANXIETY (Patient taking differently: TAKE 1 TABLET BY MOUTH ONCE DAILY AS NEEDED FOR ANXIETY) 60 tablet 2  . ferrous sulfate 325 (65 FE) MG tablet Take 1 tablet (325 mg total) by mouth 2 (two) times daily with a meal. 60 tablet 0  . furosemide (LASIX) 20 MG tablet TAKE 1 TABLET EVERY DAY 90 tablet 1  . guaiFENesin (ROBITUSSIN) 100 MG/5ML SOLN Take 15 mLs by mouth daily as needed for cough or to loosen phlegm.    [name] ibuprofen (ADVIL,MOTRIN) 200 MG tablet Take 800 mg by mouth as needed for mild pain. Reported on 09/14/2015    . Menthol (COUGH DROPS) 10 MG LOZG Take 1 lozenge by mouth daily as needed (cough).    . nystatin cream (MYCOSTATIN) Apply 1 application topically 2 (two) times daily. 30 g 0  . omeprazole (PRILOSEC) 20 MG capsule Take 1 capsule (20 mg total) by mouth daily. 90 capsule 1  . potassium chloride (KLOR-CON) 20 MEQ packet MIX 1 PACKET DAILY AS DIRECTED 30 packet 1  . predniSONE (DELTASONE) 2.5 MG tablet Take 1 tablet (2.5 mg total) by mouth daily with breakfast. 30 tablet 0  . pyridostigmine (MESTINON) 60 MG tablet Take 0.5 tablets (30  mg total) by mouth 3 (three) times daily. 120 tablet 0  . ranitidine (ZANTAC) 150 MG tablet Take 1 tablet (150 mg total) by mouth at bedtime. 30 tablet 3  . sertraline (ZOLOFT) 100 MG tablet TAKE 1 TABLET EVERY DAY 90 tablet 1   No current facility-administered medications on file prior to visit.     Past Medical History:  Diagnosis Date  . Acute respiratory failure ([place])   . Acute respiratory failure with hypoxemia ([place])   . Aspiration into airway   . CAP (community acquired pneumonia) 08/05/2015  . Depression with anxiety   . Diverticulosis   .  Dysphagia 03/2015    EGD, Dr Carlean Purl. mild antral gastritis, ? due to Ibuprofen.  no stricture but empirically maloney dilated esophagus.   . E. coli UTI 04/07/2015  . Encounter for routine gynecological examination 10/19/2015  . Endotracheally intubated   . Enteritis due to Clostridium difficile   . Fibroid uterus    size of a dime  . Gastrostomy infection ([place]) 11/08/2015  . GERD (gastroesophageal reflux disease)    hiatal hernia  . Hypertension    "went away when I stopped smoking"  . Knee injury   . Migraine    "maybe couple times/month" (03/20/2015)  . Myasthenia gravis ([place]) 2017  . Myasthenia gravis with acute exacerbation ([place]) 05/15/2015  . Osteoarthritis of left knee 12/02/2013  . Osteoarthritis of right knee 08/30/2013  . Protein-calorie malnutrition, severe ([place]) 08/07/2015  . Seasonal allergies    takes Zytrec  . Tracheostomy status ([place])   . Tubular adenoma of colon   . Tumors    "in my stomach"  . Umbilical hernia    watching , no plans for surgery at present  . VAP (ventilator-associated pneumonia) [facility])     Past Surgical History:  Procedure Laterality Date  . CESAREAN SECTION  1987; 1989  . COLONOSCOPY WITH PROPOFOL N/A 09/19/2013   Procedure: COLONOSCOPY WITH PROPOFOL;  Surgeon: Ladene Artist, MD;  Location: [facility] ENDOSCOPY;  Service: Endoscopy;  Laterality: N/A;  . DILATION AND CURETTAGE OF UTERUS    . ESOPHAGOGASTRODUODENOSCOPY N/A 08/10/2015   Procedure: ESOPHAGOGASTRODUODENOSCOPY (EGD);  Surgeon: Gatha Mayer, MD;  Location[facility] ENDOSCOPY;  Service: Endoscopy;  Laterality: N/A;  . ESOPHAGOGASTRODUODENOSCOPY (EGD) WITH PROPOFOL N/A 03/21/2015   Procedure: ESOPHAGOGASTRODUODENOSCOPY (EGD) WITH PROPOFOL;  Surgeon: Gatha Mayer, MD;  Location: [facility];  Service: Endoscopy;  Laterality: N/A;  . PARTIAL KNEE ARTHROPLASTY Right 08/30/2013   Procedure: RIGHT UNICOMPARTMENTAL KNEE;  Surgeon: Johnny Bridge, MD;  Location: [facility];  Service: Orthopedics;  Laterality:  Right;  . PARTIAL KNEE ARTHROPLASTY Left 12/02/2013   Procedure: LEFT KNEE UNI ARTHROPLASTY;  Surgeon: Johnny Bridge, MD;  Location: [facility];  Service: Orthopedics;  Laterality: Left;  . PEG PLACEMENT N/A 08/10/2015   Procedure: PERCUTANEOUS ENDOSCOPIC GASTROSTOMY (PEG) PLACEMENT;  Surgeon: Gatha Mayer, MD;  Location: [facility];  Service: Endoscopy;  Laterality: N/A;  . TUBAL LIGATION  1989  . VAGINAL HYSTERECTOMY  1990's?   "apparently took out one of my ovaries at the time too cause one's missing"    Family History  Problem Relation Age of Onset  . Heart disease Mother 53  . Hypertension Mother   . Stroke Father   . Hypertension Father   . Other Sister        Esophageal strictures  . Anemia Sister   . COPD Brother   . Leukemia Sister   . Leukemia Sister   .  Colon cancer Neg Hx     Social History   Social History  . Marital status: Married    Spouse name: N/A  . Number of children: 3  . Years of education: N/A   Occupational History  . disabled    Social History Main Topics  . Smoking status: Former Smoker    Packs/day: 0.50    Years: 38.00    Types: Cigarettes    Start date: 03/03/1974    Quit date: 05/14/2012  . Smokeless tobacco: Never Used     Comment: denies thoughts of restarting  . Alcohol use No  . Drug use: No  . Sexual activity: Not Currently    Birth control/ protection: Surgical   Other Topics Concern  . None   Social History Narrative   Work or School: [employer] Situation: lives with husband and daughters      Spiritual Beliefs: Christian      Lifestyle: no regular exercise; trying to eat healthy      [facility] (09/11/16):   Originally from [place] Has always lived in [place]. No pets currently. Does have carpet in her home in her bedroom. No bird exposure. Previously had mold under her kitchen sink. No indoor plants. Previously was working in [employer].                 Objective:   Physical Exam BP 122/82 (BP Location: Left Arm, Cuff Size: Large)   Pulse 72   Ht 5' (1.524 m)   Wt 295 lb (133.8 kg)   SpO2 100%   BMI 57.61 kg/m   General:  Morbid obesity. No distress. Comfortable. Integument:  Warm & dry. No rash on exposed skin. No bruising on exposed skin. Extremities:  No cyanosis or clubbing.  HEENT:  Moist mucus membranes. Moderate bilateral nasal turbinate swelling. No oral ulcers Cardiovascular:  Regular rate. Pitting lower extremity edema left greater than right. Unable to appreciate JVD.  Pulmonary:  Clear bilaterally to auscultation. Normal work of breathing on room air. Abdomen: Soft. Normal bowel sounds. Protuberant. Musculoskeletal:  Normal bulk and tone. No joint deformity or effusion appreciated.  PFT 10/27/16: FVC 1.57 L (66%) FEV1 1.30 L (70%) FEV1/FVC 0.83 FEF 25-75 1.56 L (79%) positive bronchodilator response TLC 3.49 L (78%) RV 100% ERV 41% DLCO corrected 82% 08/24/15: FVC 1.20 L (50%) FEV1 0.94 L (48%) FEV1/FVC 0.78 FEF 25-75 0.85 L (38%) negative bronchodilator response  6MWT 10/24/16:  Walked 288 meters / Baseline Sat 100% on RA / Nadir Sat 97% on RA @ end of test  POLYSOMNOGRAM (11/19/2015): IMPRESSIONS - Mild obstructive sleep apnea occurred during this study (AHI = 10.0/h). - Insufficient early events to meet protocol requirement for split CPAP titration - No significant central sleep apnea occurred during this study (CAI = 0.0/h). - Mild oxygen desaturation was noted during this study (Min O2 = 86.00%). - The patient snored with Loud snoring volume. - No cardiac abnormalities were noted during this study. - Clinically significant periodic limb movements did not occur during sleep. No significant associated arousals.  CPAP TITRATION (12/15/15):  Optimal CPAP pressure 14 cm H2O with moderate snoring. Central sleep apnea was not noted during titration. Moderate oxygen desaturation to 83%. No cardiac abnormalities observed.  No clinically significant periodic limb movements either. Previous polysomnogram in September  IMAGING CXR PA/LAT 09/03/16 (previously reviewed by me):  Mild silhouetting of bilateral hemidiaphragms with low lung volumes. This is  consistent with the atelectasis seen on previous abdominal and chest CT imaging by my review. No parenchymal mass or opacity appreciated. Low lung volumes. No pleural effusion. Heart normal in size & mediastinum normal in contour.  CARDIAC TTE (04/09/15):  LV normal in size with EF 60-65%. Normal regional wall motion. Grade 1 diastolic dysfunction. LA moderately dilated & RA normal in size. No aortic stenosis or regurgitation. Aortic root normal in size. No mitral stenosis or regurgitation. No pulmonic regurgitation. Mild tricuspid regurgitation. No pericardial effusion.    Assessment & Plan:  56 y.o. female with morbid obesity. This is reflected in the mild restriction on her lung volumes. Her spirometry demonstrates no evidence of fixed airway obstruction but does show a significant bronchodilator response which would correlate with underlying asthma. It's possible that with her obesity she has a reactive airways disease. Symptomatically this seems to be mild and as she does not require daily rescue medication I am holding on initiating inhaled corticosteroid therapy. Further complicating the patient's picture is her underlying sleep apnea and reflux both of which seem to be well-controlled at this time. I instructed the patient to contact me if she had any new breathing problems or questions before her next appointment.  1. Mild, intermittent asthma:  Holding on initiating inhaled corticosteroid therapy. Continuing albuterol inhaler as needed. 2. OSA: Excellent symptom control. Continuing CPAP indefinitely. Plan for repeat titration if patient loses significant weight between now and next appointment. 3. GERD: Controlled with Prilosec and Zantac. No changes. 4. Health  maintenance: Status post Tdap May 2015. Administering Pneumovax 23 today. Recommended influenza vaccine next month. 5. Follow-up: Return to clinic in 1 year or sooner if needed.  Sonia Baller Ashok Cordia, M.D. [facility] Pager:  [phone] After 3pm or if no response, call [phone] 12:18 PM 10/27/16

## 2016-10-27 NOTE — Progress Notes (Signed)
PFT done today. 

## 2016-10-28 ENCOUNTER — Ambulatory Visit (INDEPENDENT_AMBULATORY_CARE_PROVIDER_SITE_OTHER): Payer: BC Managed Care – PPO | Admitting: Family Medicine

## 2016-10-28 ENCOUNTER — Encounter: Payer: Self-pay | Admitting: Family Medicine

## 2016-10-28 NOTE — Progress Notes (Signed)
Medical Nutrition Therapy:  Appt start time: 1000 end time:  1100.  Assessment:  Primary concerns today: Weight management, E66.01 (BMI 50 or higher).  Frances Medina has been walking at least a couple times a week, usually in an air-conditioned store.  Knee pain has been a limiting factor for how long she walks.  She has been eating more vegetables, often microwaved, and she has been eating breakfast with regularity.  No problems with digestion currently.  She has been drinking mostly water, but will sometimes have sweet tea diluted with lemon juice.    She has been using a protein shake on some days, but did not know the brand name of the product or nutritional information.  Frances Medina also uses a different protein powder in her coffee.  Again, could not provide further information on it.  She is not  Interested in red meat, and protein foods in general, so protein intake is limited on some days, e.g., will sometimes have only canned broth for a meal.      Learning Readiness: Ready  Usual eating pattern includes 3 meals and 1-2 snacks per daily.   Recent physical activity includes 30-40 min 2 X wk.  She has made an effort to move more at home and is parking further away as well.  She has noticed her breathing is getting better.    24-hr recall:  (Up at 5:45 AM) B (6:15 AM)-  1 c coffee, 1 pkt of protein powder, 1 tsp cream, 1 boiled egg Snk ( AM)-  --- L (2 PM)-  1 large salad, 3-4 oz turkey&ham, 2 tbsp drsng, water Snk ( PM)-  --- D (8 PM)-  1 chx brst, 2 c grn beans, water Snk (10 PM)-  water Typical day? Yes.    Progress Towards Goal(s):  In progress.   Nutritional Diagnosis: Progress noted on  [place]-3.3 Overweight/obesity As related to energy balance.  As evidenced by weight loss of 4 lb in 4 weeks.    Intervention:  Nutrition education  Handouts given during visit include:  AVS  Demonstrated degree of understanding via:  Teach Back  Barriers to learning/adherence to lifestyle  change: poor mobility and minimal physical activity related to joint pain and fatigue.    Monitoring/Evaluation:  Dietary intake, exercise, and body weight in 8 week(s).

## 2016-10-28 NOTE — Patient Instructions (Addendum)
-   Email information about your protein supplement as well the protein powder you use:    [email or web address]  - Brand name, total calories, # of grams of protein and carbohydrate.  - Protein supplement:  Check the label to make sure it doesn't say to NOT add to a hot liquid.    Canned broth:  Look for the one with lowest sodium levels.  You can use this for a snack, but NOT for a meal.  Broth does not provide any protein.  If you want to incorporate to a meal, add some vegetables and some meat or other source of protein such as beans.  If you are using beans as a main protein source, you need to get at least 1 full cup for adequate protein.  If you buy canned beans, look for those without added salt.    Goals:  1. Include a meaningful amount of protein to each meal.    - Beans, chicken, egg, tuna fish, yogurt, cheese, meat.   2. Walk at least 30 minutes 5 times a week.    - Record the number of minutes you walk on the kitchen calendar.   3. Continue making the excellent food choices you are making:  Include vegetables twice a day, and get 3 meals and at least one snack daily.  Remember to use fruits for snacks sometimes.

## 2016-11-06 ENCOUNTER — Other Ambulatory Visit: Payer: Self-pay | Admitting: *Deleted

## 2016-11-06 ENCOUNTER — Telehealth: Payer: Self-pay

## 2016-11-06 MED ORDER — ALBUTEROL SULFATE HFA 108 (90 BASE) MCG/ACT IN AERS: 2.0000 | INHALATION_SPRAY | RESPIRATORY_TRACT | 4 refills | Status: DC | PRN

## 2016-11-06 NOTE — Telephone Encounter (Signed)
Attempted to contact pt to discuss, no answer or option for VM.

## 2016-11-06 NOTE — Addendum Note (Signed)
Addended by: Valerie Roys on: 11/06/2016 03:58 PM   Modules accepted: Orders

## 2016-11-06 NOTE — Telephone Encounter (Signed)
-----   Message from [name], MD sent at 11/06/2016  1:25 PM EDT ----- Please call for medication clarification. Is she on both Zantac and Omeprazole? I see that GI recently started her on Zantac. She should be on one or the other.  Thanks.

## 2016-11-06 NOTE — Telephone Encounter (Signed)
Please advise her that she will need to obtain both refills from GI then. I don't feel comfortable having her on both.Thanks.

## 2016-11-06 NOTE — Telephone Encounter (Signed)
Attempted to contact pt to clarify medication use. Pt did not answer and no option for VM.

## 2016-11-06 NOTE — Telephone Encounter (Signed)
Spoke with patient and she states that she takes the omeprazole in the mornings and her GI provider added the zantac to take at night. Frances Medina,CMA

## 2016-11-07 NOTE — Telephone Encounter (Signed)
Patient is aware and voiced understanding. Frances Medina,CMA

## 2016-11-20 ENCOUNTER — Encounter: Payer: Self-pay | Admitting: Internal Medicine

## 2016-11-20 ENCOUNTER — Ambulatory Visit (INDEPENDENT_AMBULATORY_CARE_PROVIDER_SITE_OTHER): Payer: BC Managed Care – PPO | Admitting: Internal Medicine

## 2016-11-20 ENCOUNTER — Ambulatory Visit: Payer: Medicare Other | Admitting: Internal Medicine

## 2016-11-20 DIAGNOSIS — H9202 Otalgia, left ear: Secondary | ICD-10-CM | POA: Diagnosis not present

## 2016-11-20 DIAGNOSIS — R05 Cough: Secondary | ICD-10-CM

## 2016-11-20 DIAGNOSIS — R059 Cough, unspecified: Secondary | ICD-10-CM

## 2016-11-20 MED ORDER — BENZONATATE 100 MG PO CAPS: 200.0000 mg | ORAL_CAPSULE | Freq: Three times a day (TID) | ORAL | 0 refills | Status: DC | PRN

## 2016-11-20 NOTE — Assessment & Plan Note (Signed)
Likely viral etiology. Lungs CTAB on exam with normal WOB on RA. Improved with albuterol and Robitussin. Will continue conservative management - Tessalon perles PRN - Continue albuterol inhaler and/or neb PRN - Strict return precautions

## 2016-11-20 NOTE — Patient Instructions (Addendum)
It was so nice meeting you today Mrs. Allcock!  For your cough, you can take one Tessalon perle up to every 8 hours as needed. You can also try eating a teaspoonful of honey, either alone or mixed into a warm beverage, 3-4 times a day. Please continue to use your albuterol inhaler and nebulizer as needed for cough and wheezing.   For ear pain and/or headache, you can keep taking ibuprofen or Tylenol.   If you are not getting better by Monday, please let us know.   If you have any questions or concerns, please feel free to call the clinic.  Thank you for trusting me with your care.   Be well,  Dr. Avon Gully

## 2016-11-20 NOTE — Assessment & Plan Note (Signed)
With decreased hearing. Likely 2/2 cerumen impaction. May also be component of viral otitis media, however ear irritation more likely 2/2 trying to clean out ear aggressively with bobby pin. Large amount of cerumen removed with irrigation and subsequent improvement in symptoms. Encouraged not to try to clean out ears from now, especially not with bobby pins. Can take ibuprofen or Tylenol for ear pain/sinus pressure.

## 2016-11-20 NOTE — Progress Notes (Signed)
   Subjective:   Patient: Frances Medina       Birthdate: Sep 13, 1960       MRN: [id number]      HPI  CHAPEL SILVERTHORN is a 56 y.o. female presenting for same day visit for cough and L ear pain.   Cough Began one week ago. Productive of clear sputum. Is not getting worse but has not improved. Has been using Robitussin as well as albuterol inhaler and neb with symptomatic improvement with both. Denies fevers. Endorses chills only at night which she says have been ongoing since beginning menopause. Decreased appetite but this is also not a new issue. She is drinking well. Endorses some sinus pressure but says she typically has sharp pains in her head 2/2 myasthenia gravis so is unsure whether this is truly due to sinus congestion.   L ear pain Ongoing for about a week as well. With accompanying decreased hearing. Tried to clean out her ear with a bobby pin and was able to remove a small amount of wax with some improvement in her hearing. Denies ear drainage. R ear with minimal pain but not nearly as bad as L.   Smoking status reviewed. Patient is former smoker (quit 2014).   Review of Systems See HPI.     Objective:  Physical Exam  Constitutional: She is oriented to person, place, and time and well-developed, well-nourished, and in no distress.  HENT:  Head: Normocephalic and atraumatic.  Nose: Nose normal.  Mouth/Throat: Oropharynx is clear and moist. No oropharyngeal exudate.  Cerumen impaction in L ear. Mild irritation of ear canal in both ears likely due to patient trying to clean ears with bobby pin. R ear with minimal cerumen and clear, non-bulging, non-erythematous TM.   Eyes: Pupils are equal, round, and reactive to light. Conjunctivae and EOM are normal. Right eye exhibits no discharge. Left eye exhibits no discharge.  Neck: Normal range of motion. Neck supple.  Cardiovascular: Normal rate, regular rhythm and normal heart sounds.   No murmur heard. Pulmonary/Chest: Effort normal and  breath sounds normal. No respiratory distress. She has no wheezes.  Lymphadenopathy:    She has no cervical adenopathy.  Neurological: She is alert and oriented to person, place, and time.  Skin: Skin is warm and dry.  Psychiatric: Affect and judgment normal.      Assessment & Plan:  Cough Likely viral etiology. Lungs CTAB on exam with normal WOB on RA. Improved with albuterol and Robitussin. Will continue conservative management - Tessalon perles PRN - Continue albuterol inhaler and/or neb PRN - Strict return precautions  Left ear pain With decreased hearing. Likely 2/2 cerumen impaction. May also be component of viral otitis media, however ear irritation more likely 2/2 trying to clean out ear aggressively with bobby pin. Large amount of cerumen removed with irrigation and subsequent improvement in symptoms. Encouraged not to try to clean out ears from now, especially not with bobby pins. Can take ibuprofen or Tylenol for ear pain/sinus pressure.    Adin Hector, MD, MPH PGY-3 [facility] Pager [phone]

## 2016-11-24 IMAGING — CR DG ABD PORTABLE 1V
1 series · 1 of 1 positions shown · non-contrast
Comparison: 05/26/2015 abdominal radiograph

CLINICAL DATA: Enteric tube placement

EXAM:
PORTABLE ABDOMEN - 1 VIEW

[AP]
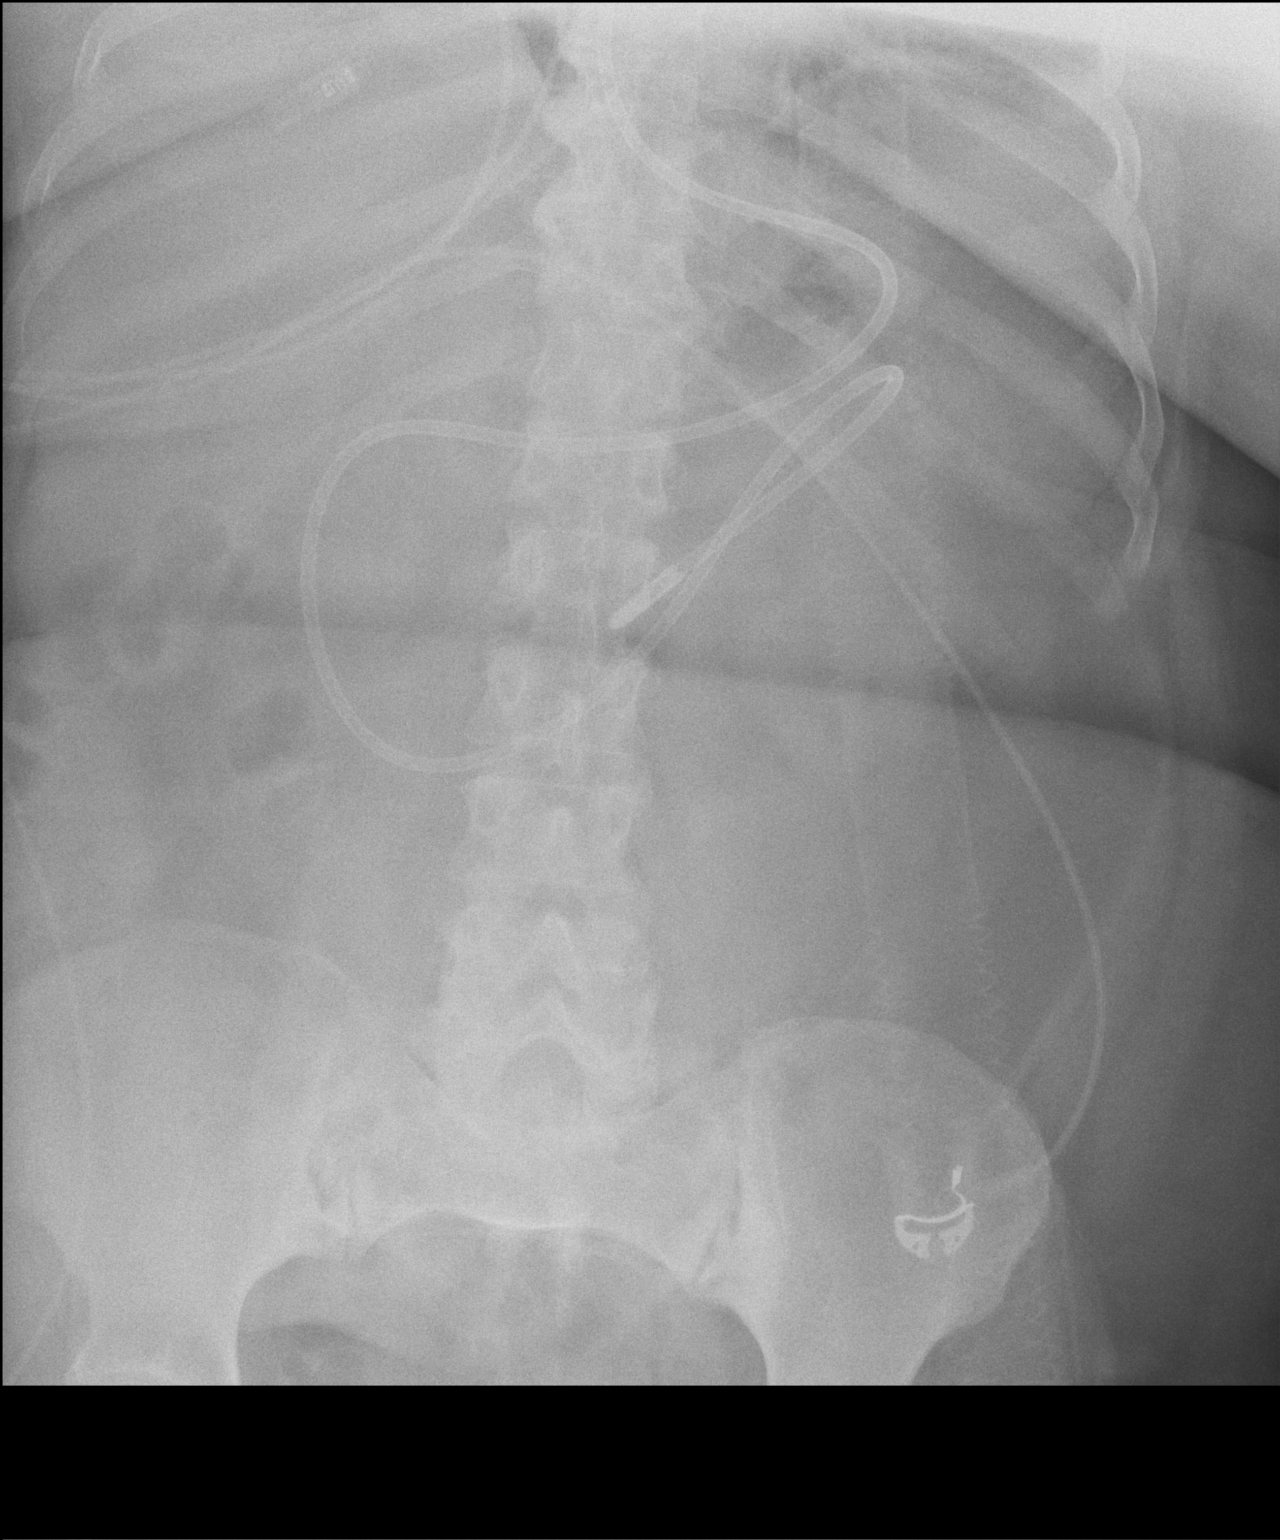

[1 of 1 positions shown; findings below may reference images not displayed]

FINDINGS: Weighted enteric tube traverses the stomach and loops in the distal
duodenum, with the tip of likely in the proximal jejunum versus
fourth portion of the duodenum. No dilated small bowel loops. No
evidence of pneumatosis or pneumoperitoneum.
IMPRESSION: Weighted enteric tube terminates in a post pyloric position, either
in the proximal jejunum or fourth portion of the duodenum.

## 2016-12-20 ENCOUNTER — Other Ambulatory Visit: Payer: Self-pay | Admitting: Family Medicine

## 2016-12-25 ENCOUNTER — Telehealth: Payer: Self-pay | Admitting: Family Medicine

## 2016-12-30 ENCOUNTER — Ambulatory Visit: Payer: Medicare Other | Admitting: Family Medicine

## 2017-01-07 ENCOUNTER — Other Ambulatory Visit: Payer: Self-pay | Admitting: Family Medicine

## 2017-01-08 DIAGNOSIS — G7000 Myasthenia gravis without (acute) exacerbation: Secondary | ICD-10-CM | POA: Diagnosis not present

## 2017-01-13 ENCOUNTER — Other Ambulatory Visit: Payer: Self-pay | Admitting: Pulmonary Disease

## 2017-01-26 ENCOUNTER — Other Ambulatory Visit: Payer: Self-pay | Admitting: Family Medicine

## 2017-02-16 ENCOUNTER — Other Ambulatory Visit: Payer: Self-pay

## 2017-02-16 ENCOUNTER — Ambulatory Visit (INDEPENDENT_AMBULATORY_CARE_PROVIDER_SITE_OTHER): Payer: BC Managed Care – PPO | Admitting: Internal Medicine

## 2017-02-16 ENCOUNTER — Encounter: Payer: Self-pay | Admitting: Internal Medicine

## 2017-02-16 DIAGNOSIS — M25552 Pain in left hip: Secondary | ICD-10-CM

## 2017-02-16 MED ORDER — TRAMADOL HCL 50 MG PO TABS: 50.0000 mg | ORAL_TABLET | Freq: Two times a day (BID) | ORAL | 0 refills | Status: DC | PRN

## 2017-02-16 MED ORDER — MELOXICAM 15 MG PO TABS: 15.0000 mg | ORAL_TABLET | Freq: Every day | ORAL | 0 refills | Status: DC

## 2017-02-16 NOTE — Patient Instructions (Addendum)
It was so nice to see you today!  I have changed your anti-inflammatory to Mobic. Please take 1 tablet once a day for the next 15 days. Please do NOT take Ibuprofen with this. I have also prescribed a few tablets of Tramadol to help with severe pain.   If your pain is not better, we could always consider sending you to physical therapy or trying an injection.  -Dr. Brett Albino

## 2017-02-18 DIAGNOSIS — M1612 Unilateral primary osteoarthritis, left hip: Secondary | ICD-10-CM | POA: Insufficient documentation

## 2017-02-18 DIAGNOSIS — M25552 Pain in left hip: Secondary | ICD-10-CM

## 2017-02-18 HISTORY — DX: Pain in left hip: M25.552

## 2017-02-18 NOTE — Progress Notes (Signed)
   [facility] Phone: [phone]  Subjective:  Frances Medina is a 56 year old female presenting to clinic with left leg pain for years. She states the pain has gotten worse over the last couple of months. She has been seen for this at [facility] before and has had injections performed under fluoroscopy. The injections were helpful for pain relief, but they are painful to have done. The pain is located in her groin. She is also having pain on the outside of her hip. She notes numbness on the outside of her hip as well, but does not know how long this has been going on. The pain is worse with walking, but she also has a hard time getting comfortable while sitting at church. She has been taking Ibuprofen 1000mg  tid, which takes the "sharpness" of the pain away. The pain is so bad that she has had to use a walker at times. She thinks the pain has gotten worse due to recent weight gain.  ROS: See HPI for pertinent positives and negatives  Past Medical History- HTN, myasthenia gravis, OSA, restrictive lung disease, anxiety, depression, morbid obesity  Family history reviewed for today's visit. No changes.  Social history- patient is a former smoker, quit in 2014.  Objective: BP 99/62   Pulse 67   Temp 98.1 F (36.7 C) (Oral)   Wt 299 lb (135.6 kg)   SpO2 97%   BMI 58.39 kg/m  Gen: NAD, alert, cooperative with exam Back: Decreased ROM in all directions due to pain, no midline tenderness, mild tenderness of the right paraspinal muscles of the lumbar spine. Left Hip: +tenderness to palpation over the trochanteric bursa, +FADIR, negative FABER, positive log roll test. Neuro: Straight leg raise negative bilaterally, +mild numbness noted in the lateral hip/upper thigh. Walks with a limp.  Assessment/Plan: Left Hip Pain: Has been going on for years and has had injections under fluoroscopy at [facility] in the past. Her pain seems to be in the hip joint, given that she  has pain in her groin and she has a positive FADIR test on exam. Unable to see prior imaging (including MRIs) that have been done at [facility]. She does seem to also have a component of trochanteric bursitis, as she has point tenderness to palpation over that area. Do not think this is coming from the lumbar spine, as she denies any back pain and her back exam is relatively unremarkable. - Discussed doing a trochanteric bursa injection in clinic today, but patient declined. - Also discussed doing an oral steroid burst, but patient declined, as she was just recently weaned off steroids for her myasthenia gravis. - Will switch patient from Ibuprofen to Meloxicam x 2 weeks to see if this helps her pain better. Have also given patient Tramadol #8 to use for severe/breakthrough pain. - Patient should follow-up with [facility] for further management. Will defer any additional imaging to them.   Hyman Bible, MD PGY-3

## 2017-02-18 NOTE — Assessment & Plan Note (Signed)
Has been going on for years and has had injections under fluoroscopy at [facility] in the past. Her pain seems to be in the hip joint, given that she has pain in her groin and she has a positive FADIR test on exam. Unable to see prior imaging (including MRIs) that have been done at [facility]. She does seem to also have a component of trochanteric bursitis, as she has point tenderness to palpation over that area. Do not think this is coming from the lumbar spine, as she denies any back pain and her back exam is relatively unremarkable. - Discussed doing a trochanteric bursa injection in clinic today, but patient declined. - Also discussed doing an oral steroid burst, but patient declined, as she was just recently weaned off steroids for her myasthenia gravis. - Will switch patient from Ibuprofen to Meloxicam x 2 weeks to see if this helps her pain better. Have also given patient Tramadol #8 to use for severe/breakthrough pain. - Patient should follow-up with [facility] for further management. Will defer any additional imaging to them.

## 2017-02-19 ENCOUNTER — Telehealth: Payer: Self-pay | Admitting: Family Medicine

## 2017-02-19 NOTE — Telephone Encounter (Signed)
I got a letter from [facility] that patient is on both Zantac and Omeprazole. I did not prescribe Zantac to her. I called her to confirm, she stated that her GI doctor  Recommended that she takes Omeprazole in the morning and Zantac at night. As discussed with her, since this was GI's recommendation, I will suggest she gets both refill from them. She stated she will contact her gastroenterologist's office right away to clarify this; in the mean time, I called her pharmacy and spoke with [facility] who canceled her Omeprazole refill.  Again, patient is aware she should not be on both unless her GI doctor strongly recommend it.

## 2017-02-19 NOTE — Telephone Encounter (Signed)
Pt wanted to let Frances Medina know that she got her medication situation figured out.

## 2017-02-19 NOTE — Telephone Encounter (Signed)
Will forward to MD to make her aware.  Jazmin Hartsell,CMA  

## 2017-03-06 ENCOUNTER — Other Ambulatory Visit: Payer: Self-pay | Admitting: Family Medicine

## 2017-03-06 NOTE — Telephone Encounter (Signed)
Pt would like a refill on tramadol that [facility] gave her at her last appointment. Please advise

## 2017-03-06 NOTE — Telephone Encounter (Signed)
LM for patient ok per DPR explaining message from MD. Frances Medina

## 2017-03-06 NOTE — Telephone Encounter (Signed)
Please advise patient. Tramadol was prescribed by [facility] for a short duration. She is not supposed to be on it chronically. F/U with orthopedic as planned.

## 2017-03-23 ENCOUNTER — Other Ambulatory Visit: Payer: Self-pay

## 2017-03-23 ENCOUNTER — Other Ambulatory Visit: Payer: Self-pay | Admitting: Family Medicine

## 2017-03-23 ENCOUNTER — Ambulatory Visit (INDEPENDENT_AMBULATORY_CARE_PROVIDER_SITE_OTHER): Payer: BC Managed Care – PPO | Admitting: Family Medicine

## 2017-03-23 VITALS — BP 150/90 | HR 80 | Temp 98.0°F | Ht 60.0 in | Wt 299.4 lb

## 2017-03-23 DIAGNOSIS — R69 Illness, unspecified: Secondary | ICD-10-CM | POA: Diagnosis not present

## 2017-03-23 DIAGNOSIS — J111 Influenza due to unidentified influenza virus with other respiratory manifestations: Secondary | ICD-10-CM

## 2017-03-23 MED ORDER — OSELTAMIVIR PHOSPHATE 75 MG PO CAPS: 75.0000 mg | ORAL_CAPSULE | Freq: Two times a day (BID) | ORAL | Status: AC

## 2017-03-23 MED ORDER — OSELTAMIVIR PHOSPHATE 75 MG PO CAPS: 75.0000 mg | ORAL_CAPSULE | Freq: Two times a day (BID) | ORAL | 0 refills | Status: AC

## 2017-03-23 MED ORDER — TRAMADOL HCL 50 MG PO TABS: 50.0000 mg | ORAL_TABLET | Freq: Three times a day (TID) | ORAL | 0 refills | Status: DC | PRN

## 2017-03-23 NOTE — Progress Notes (Signed)
   Subjective:    Patient ID: [name], female    DOB: [birth year], 57 y.o.   MRN: [id number] MARYLN EASTHAM is accompanied by daughter and husband Sources of clinical information for visit is/are patient and past medical records. Nursing assessment for this office visit was reviewed with the patient for accuracy and revision.  Previous Report(s) Reviewed: historical medical records  Depression screen [facility] 2/9 02/16/2017  Decreased Interest 0  Down, Depressed, Hopeless 0  PHQ - 2 Score 0  Altered sleeping -  Tired, decreased energy -  Change in appetite -  Feeling bad or failure about yourself  -  Trouble concentrating -  Moving slowly or fidgety/restless -  Suicidal thoughts -  PHQ-9 Score -  Difficult doing work/chores -   Fall Risk  02/29/2016 01/17/2016 06/18/2015 05/01/2015 05/01/2015  Falls in the past year? No No No No No    HPI UPPER RESPIRATORY INFECTION  Onset: Yesterday while at grocery store  Course: Acute onset  Meds tried: Robitussin AC and Ibuprofen 5 tablets twice a day Sick contacts: nieces, nephews, lots of children  Nasal discharge (color,laterality): no  Sinusitis Risk Factors Fever: no, But acute rigors with teeth rattling, unable to get warm   Headache/face pain: yes  Double sickening: no  Tooth pain: no   Allergy Risk Factors: Sneezing: no  Itchy scratchy throat: no  Seasonal sx: no   Flu Risk Factors Headache: yes  Muscle aches: yes  Severe fatigue: yes    Red Flags  Stiff neck: no  Dyspnea: no  Rash: no  Swallowing difficulty: no       Review of Systems See HPI    Objective:   Physical Exam VS reviewed GEN: Alert, Cooperative, Groomed, NAD, bundled up in clothing, shivering HEENT: PERRL; EAC bilaterally not occluded, TM's translucent with normal LM, (+) LR;                No cervical LAN, No thyromegaly, No palpable masses COR: RRR, No M/G/R, No JVD, Normal PMI size and location LUNGS: BCTA, No Acc mm use, speaking in full  sentences Chest: Tender to palpation bilateral anterior rib cage Gait: Normal speed, No significant path deviation, Step through +,  Psych: Normal affect/thought/speech/language    Assessment & Plan:  Influenza-like Illness - Rx Tamiflu 75 mg PO BID.  No case reports of worsening myasthenia with Tamiflu.  - Rest, po fluids, ibuprofen for ahces and fever - Tramadol 50 mg q 6 hours prn body aches and hip pain worsening with ILI. Disp#30. RF zero.  - RTC if worsening or not improved in a week.

## 2017-03-23 NOTE — Addendum Note (Signed)
Addended byWendy Poet, Devynn Hessler D on: 03/23/2017 04:52 PM   Modules accepted: Orders

## 2017-03-23 NOTE — Patient Instructions (Signed)

## 2017-03-23 NOTE — Progress Notes (Signed)
She seems to be getting Tramadol from various providers. I will avoid this meds if possible.

## 2017-04-09 DIAGNOSIS — G7000 Myasthenia gravis without (acute) exacerbation: Secondary | ICD-10-CM | POA: Diagnosis not present

## 2017-04-09 DIAGNOSIS — Z6841 Body Mass Index (BMI) 40.0 and over, adult: Secondary | ICD-10-CM | POA: Diagnosis not present

## 2017-04-13 ENCOUNTER — Other Ambulatory Visit: Payer: Self-pay | Admitting: Family Medicine

## 2017-04-13 DIAGNOSIS — J111 Influenza due to unidentified influenza virus with other respiratory manifestations: Secondary | ICD-10-CM

## 2017-04-13 DIAGNOSIS — R69 Illness, unspecified: Principal | ICD-10-CM

## 2017-04-14 ENCOUNTER — Other Ambulatory Visit: Payer: Self-pay | Admitting: Family Medicine

## 2017-04-14 ENCOUNTER — Telehealth: Payer: Self-pay | Admitting: *Deleted

## 2017-04-14 DIAGNOSIS — J111 Influenza due to unidentified influenza virus with other respiratory manifestations: Secondary | ICD-10-CM

## 2017-04-14 DIAGNOSIS — R69 Illness, unspecified: Principal | ICD-10-CM

## 2017-04-14 MED ORDER — DICLOFENAC POTASSIUM 50 MG PO TABS: 50.0000 mg | ORAL_TABLET | Freq: Three times a day (TID) | ORAL | 1 refills | Status: DC | PRN

## 2017-04-14 NOTE — Telephone Encounter (Signed)
Spoke with patient regarding Tramadol use.  States that she has gone to [facility] and has also tried physical therapy with no relief.  Patient states that she has tried the cortisone shots in her groin for her hip and they only last her a week and feel that they are a not worth continuing.  Patient said that she is seeing her [facility] specialist for possible bariatric surgery but would like to be able to exercise and try to get the weight off that way.  She is unable to do any exercise due to the pain from her bursitis.  Patient also needs a letter from PCP stating that she is using her CPAP as directed and would benefit from trying the "so-clean" brand that comes with it's own sterilization device.  Once letter is written it needs to be faxed to [facility] so they can send it to her insurance.  Will forward to MD to advise. Jazmin Hartsell,CMA

## 2017-04-14 NOTE — Telephone Encounter (Signed)
-----   Message from [name], MD sent at 04/13/2017  1:44 PM EST ----- Please contact patient and let her know.  What pain she using Tramadol for?  Tramadol was not intended for chronic pain control when prescribed by Dr. Brett Albino.  Dr. McDiarmid refilled it recently for flu-like pain symptoms.   Need full evaluation and diagnosis of what we are treating. There is documentation about hip pain for which she was seen by Jarome Matin. I will need a copy of the result and their diagnosis.  Has she followed with them?  See note below.        Has been going on for years and has had injections under fluoroscopy at [facility] in the past. Her pain seems to be in the hip joint, given that she has pain in her groin and she has a positive FADIR test on exam. Unable to see prior imaging (including MRIs) that have been done at [facility]. She does seem to also have a component of trochanteric bursitis, as she has point tenderness to palpation over that area. Do not think this is coming from the lumbar spine, as she denies any back pain and her back exam is relatively unremarkable. - Discussed doing a trochanteric bursa injection in clinic today, but patient declined. - Also discussed doing an oral steroid burst, but patient declined, as she was just recently weaned off steroids for her myasthenia gravis. - Will switch patient from Ibuprofen to Meloxicam x 2 weeks to see if this helps her pain better. Have also given patient Tramadol #8 to use for severe/breakthrough pain. - Patient should follow-up with [facility] for further management. Will defer any additional imaging to them.

## 2017-04-14 NOTE — Telephone Encounter (Signed)
Who referred her to Jarome Matin.  I have not seen her for Hip pain neither did I see any referral on file.  I need a report of MRI confirming her diagnosis.   I need a copy of her report from [employer] [facility].  Afterwards, she will need to sign pain contract if we have appropriate diagnosis.  Please help obtain this as soon as possible.  In the mean time, I will send in a pain medicine to her pharmacy.

## 2017-04-16 MED ORDER — TRAMADOL HCL 50 MG PO TABS: 50.0000 mg | ORAL_TABLET | Freq: Three times a day (TID) | ORAL | 1 refills | Status: DC | PRN

## 2017-04-16 MED ORDER — DICLOFENAC SODIUM 50 MG PO TBEC: 50.0000 mg | DELAYED_RELEASE_TABLET | Freq: Three times a day (TID) | ORAL | 0 refills | Status: DC | PRN

## 2017-04-16 NOTE — Telephone Encounter (Signed)
Chart reviewed. Yes, it is ok to switch. I will send in new rx for diclofenac sodium.  Leeanne Rio, MD

## 2017-04-16 NOTE — Telephone Encounter (Signed)
Spoke with patient and informed her that script was being sent to pharmacy.  I didn't see one sent this month and patient will need a refill.  She did make an appointment for Dr. McDiarmid next week to be seen a fully evaluated for her hip pain and possible pain contract depending on imaging.  Will forward to the preceptor to see if they can fill tramadol for patient.   Jazmin Hartsell,CMA

## 2017-04-16 NOTE — Addendum Note (Signed)
Addended by: Leeanne Rio on: 04/16/2017 03:00 PM   Modules accepted: Orders

## 2017-04-16 NOTE — Telephone Encounter (Signed)
Fax received from pharmacy stating that diclofenac potassium is on backorder until May and would like to know if it is ok to fill diclofenac sodium instead.  Will forward to covering MDs to advise. Jazmin Hartsell,CMA

## 2017-04-16 NOTE — Addendum Note (Signed)
Addended by: Leeanne Rio on: 04/16/2017 02:55 PM   Modules accepted: Orders

## 2017-04-23 ENCOUNTER — Ambulatory Visit (INDEPENDENT_AMBULATORY_CARE_PROVIDER_SITE_OTHER): Payer: BC Managed Care – PPO | Admitting: Family Medicine

## 2017-04-23 ENCOUNTER — Encounter: Payer: Self-pay | Admitting: Family Medicine

## 2017-04-23 VITALS — BP 136/88 | HR 91 | Temp 98.0°F | Ht 60.0 in | Wt 299.8 lb

## 2017-04-23 DIAGNOSIS — M1612 Unilateral primary osteoarthritis, left hip: Secondary | ICD-10-CM | POA: Diagnosis not present

## 2017-04-23 DIAGNOSIS — R1032 Left lower quadrant pain: Secondary | ICD-10-CM

## 2017-04-23 DIAGNOSIS — G8929 Other chronic pain: Secondary | ICD-10-CM

## 2017-04-23 MED ORDER — TRAMADOL HCL 50 MG PO TABS: 25.0000 mg | ORAL_TABLET | Freq: Two times a day (BID) | ORAL | 3 refills | Status: DC | PRN

## 2017-04-23 NOTE — Patient Instructions (Signed)
Start taking half a tablet of tramadol with ibuprofen twice a day for the left hip pain.  Pleae go to [facility] for an Xray of your left hip to get an idea of how severe the left hip arthritis is.

## 2017-04-24 ENCOUNTER — Other Ambulatory Visit: Payer: Self-pay | Admitting: Family Medicine

## 2017-04-24 ENCOUNTER — Ambulatory Visit
Admission: RE | Admit: 2017-04-24 | Discharge: 2017-04-24 | Disposition: A | Discharge status: Home / Self Care | Payer: BC Managed Care – PPO | Source: Ambulatory Visit | Attending: Family Medicine | Admitting: Family Medicine

## 2017-04-24 ENCOUNTER — Encounter: Payer: Self-pay | Admitting: Family Medicine

## 2017-04-24 DIAGNOSIS — R1032 Left lower quadrant pain: Principal | ICD-10-CM

## 2017-04-24 DIAGNOSIS — G8929 Other chronic pain: Secondary | ICD-10-CM

## 2017-04-24 DIAGNOSIS — M1612 Unilateral primary osteoarthritis, left hip: Secondary | ICD-10-CM | POA: Diagnosis not present

## 2017-04-24 NOTE — Progress Notes (Signed)
   Subjective:    Patient ID: Frances Medina, female    DOB: June 07, 1960, 57 y.o.   MRN: [id number] Frances Medina is alone Sources of clinical information for visit is/are patient and past medical records. Nursing assessment for this office visit was reviewed with the patient for accuracy and revision.  Previous Report(s) Reviewed: historical medical records, radiology reports and consult notes Dr Mardelle Matte and Dr Ron Agee from [facility] visits 07/21/14 , 04/18/14, 03/22/14  Lumbar MRI without CM 01/23/2104 report: Severe lower lumbar facet arthropathy, progressed and most pronounced L4-5.  Chronic epidural lipomatosis effacing CSF from the thecal sac at L5-S1 level. Depression screen PHQ 2/9 02/16/2017  Decreased Interest 0  Down, Depressed, Hopeless 0  PHQ - 2 Score 0  Altered sleeping -  Tired, decreased energy -  Change in appetite -  Feeling bad or failure about yourself  -  Trouble concentrating -  Moving slowly or fidgety/restless -  Suicidal thoughts -  PHQ-9 Score -  Difficult doing work/chores -   Fall Risk  02/29/2016 01/17/2016 06/18/2015 05/01/2015 05/01/2015  Falls in the past year? No No No No No    HPI  Left groin pain Onset: present for years, pt relates to starting afterleft TKR 12/2013 Location: left groin Quality: deep aching Severity: moderate to severe Function: interferes with prolonged standing and walking more than few hundred feet Pattern: persistent Course: chronic with worsening in last couple months.  Radiation: down into anterior thigh Relief: Adequate relief when takes two to three OTC Ibuprofen and half tablet of Tramadol 50 mg tablet twice a day Associated Symptoms: no incontinence of urine or stool. No weight loss, no falls.  Trauma (Acute or Chronic): No  Prior Diagnostic Testing or Treatments:  Dr Joya Salm ([facility]) has eval left hip pain in 2015-16 time period.    CT myelogram by Dr Joya Salm of lumbar spine which showed significantly  degenerative changes in lumbar spine with chronic sacroilitis.    A CT pelvis in 11/28/15 confimed these DJD in lumbar spine and bilateral chronic sacroilitis. Dr Luanna Cole last ov note 2016 thought that patient may have hip replacement in not too distant future, especially if she can lose weight.   Prior Physical Therapy with [facility] has helped only minimally.  Personal review of pelvic CT (04/03/16) showed sclerotic changes of left acetabulum and narrowing of the left femoral-acetabular joint space.   Relevant PMH/PSH: Prior fluoroscopic steroid injections by Dr Ron Agee at [place] office (07/21/14). Pt reported that it provided relief for only a few days.   Patient last seen in our office for left hip pain in 01/2017 by Dr Brett Albino treated with meloxicam x 2 weeks and few tablets of tramadol Myasthenia gravis   Review of Systems No fever No fecal incontience    Objective:   Physical Exam Left Hip: ROM IR: 33 Deg with pain, ER: 80 Deg, Flexion: 120 Deg, Extension: 100 Deg, Abduction: 45 Deg Strength Flexion: 5/5, Extension: 5/5, Abduction: 5/5, Adduction: 5/5 Pelvic alignment unremarkable to inspection and palpation. Standing hip rotation and gait without trendelenburg / unsteadiness. Greater trochanter without tenderness to palpation. TTP bilateral SI joint tenderness Gait: atalgic gait with reduced left stance phase compared to right stance phase    04/23/17 Review of [place] CSRS shows pt received total of 60 tablets of tramadol only from [facility] physicians in last 2 months.  Last Rx from Dr Dorcas Mcmurray of 30 tablets on 04/16/17.  Assessment & Plan:

## 2017-04-24 NOTE — Assessment & Plan Note (Signed)
Established problem Uncontrolled pain and is impairing function After discussion of options of repeat intraarticular hip steroid injection, referral for consideration of hip replacement, repeat PT, analgesics including tramadol 25 mg  and Ibuprofen 600 combination twice a day. She is planning on attending a Bariatric program at [facility] in preparation for future hip replacement. We agreed on prescription of tramadol 50 mg tablet, half tablet (25 mg) twice a day with refills 3.  I recommended that Frances Medina discuss competing a pain contract with Dr Gwendlyn Deutscher when she returns.  No early refills discussed with Frances Medina.  Frances Medina is to continue her HEP and use of cane.  I have ordered a left hip Xray to reassess severity of hip OA. RTC 6 wks to evaluate therapy

## 2017-05-15 ENCOUNTER — Other Ambulatory Visit: Payer: Self-pay | Admitting: *Deleted

## 2017-05-15 MED ORDER — RANITIDINE HCL 150 MG PO TABS: ORAL_TABLET | ORAL | 5 refills | Status: DC

## 2017-06-08 ENCOUNTER — Other Ambulatory Visit: Payer: Self-pay

## 2017-06-08 ENCOUNTER — Ambulatory Visit (INDEPENDENT_AMBULATORY_CARE_PROVIDER_SITE_OTHER): Payer: BC Managed Care – PPO | Admitting: Family Medicine

## 2017-06-08 ENCOUNTER — Encounter: Payer: Self-pay | Admitting: Family Medicine

## 2017-06-08 VITALS — BP 132/62 | HR 87 | Temp 98.0°F | Wt 298.0 lb

## 2017-06-08 DIAGNOSIS — K219 Gastro-esophageal reflux disease without esophagitis: Secondary | ICD-10-CM

## 2017-06-08 MED ORDER — RANITIDINE HCL 150 MG PO TABS: 150.0000 mg | ORAL_TABLET | Freq: Two times a day (BID) | ORAL | 2 refills | Status: DC

## 2017-06-08 NOTE — Patient Instructions (Signed)
Try ranitidine twice a day.   Gastroesophageal Reflux Disease, Adult Normally, food travels down the esophagus and stays in the stomach to be digested. If a person has gastroesophageal reflux disease (GERD), food and stomach acid move back up into the esophagus. When this happens, the esophagus becomes sore and swollen (inflamed). Over time, GERD can make small holes (ulcers) in the lining of the esophagus. Follow these instructions at home: Diet  Follow a diet as told by your doctor. You may need to avoid foods and drinks such as: ? Coffee and tea (with or without caffeine). ? Drinks that contain alcohol. ? Energy drinks and sports drinks. ? Carbonated drinks or sodas. ? Chocolate and cocoa. ? Peppermint and mint flavorings. ? Garlic and onions. ? Horseradish. ? Spicy and acidic foods, such as peppers, chili powder, curry powder, vinegar, hot sauces, and BBQ sauce. ? Citrus fruit juices and citrus fruits, such as oranges, lemons, and limes. ? Tomato-based foods, such as red sauce, chili, salsa, and pizza with red sauce. ? Fried and fatty foods, such as donuts, french fries, potato chips, and high-fat dressings. ? High-fat meats, such as hot dogs, rib eye steak, sausage, ham, and bacon. ? High-fat dairy items, such as whole milk, butter, and cream cheese.  Eat small meals often. Avoid eating large meals.  Avoid drinking large amounts of liquid with your meals.  Avoid eating meals during the 2-3 hours before bedtime.  Avoid lying down right after you eat.  Do not exercise right after you eat. General instructions  Pay attention to any changes in your symptoms.  Take over-the-counter and prescription medicines only as told by your doctor. Do not take aspirin, ibuprofen, or other NSAIDs unless your doctor says it is okay.  Do not use any tobacco products, including cigarettes, chewing tobacco, and e-cigarettes. If you need help quitting, ask your doctor.  Wear loose clothes. Do  not wear anything tight around your waist.  Raise (elevate) the head of your bed about 6 inches (15 cm).  Try to lower your stress. If you need help doing this, ask your doctor.  If you are overweight, lose an amount of weight that is healthy for you. Ask your doctor about a safe weight loss goal.  Keep all follow-up visits as told by your doctor. This is important. Contact a doctor if:  You have new symptoms.  You lose weight and you do not know why it is happening.  You have trouble swallowing, or it hurts to swallow.  You have wheezing or a cough that keeps happening.  Your symptoms do not get better with treatment.  You have a hoarse voice. Get help right away if:  You have pain in your arms, neck, jaw, teeth, or back.  You feel sweaty, dizzy, or light-headed.  You have chest pain or shortness of breath.  You throw up (vomit) and your throw up looks like blood or coffee grounds.  You pass out (faint).  Your poop (stool) is bloody or black.  You cannot swallow, drink, or eat. This information is not intended to replace advice given to you by your health care provider. Make sure you discuss any questions you have with your health care provider. Document Released: 08/06/2007 Document Revised: 07/26/2015 Document Reviewed: 06/14/2014 Elsevier Interactive Patient Education  2018 [place] for Gastroesophageal Reflux Disease, Adult When you have gastroesophageal reflux disease (GERD), the foods you eat and your eating habits are very important. Choosing the right  foods can help ease your discomfort. What guidelines do I need to follow?  Choose fruits, vegetables, whole grains, and low-fat dairy products.  Choose low-fat meat, fish, and poultry.  Limit fats such as oils, salad dressings, butter, nuts, and avocado.  Keep a food diary. This helps you identify foods that cause symptoms.  Avoid foods that cause symptoms. These may be different for  everyone.  Eat small meals often instead of 3 large meals a day.  Eat your meals slowly, in a place where you are relaxed.  Limit fried foods.  Cook foods using methods other than frying.  Avoid drinking alcohol.  Avoid drinking large amounts of liquids with your meals.  Avoid bending over or lying down until 2-3 hours after eating. What foods are not recommended? These are some foods and drinks that may make your symptoms worse: Vegetables Tomatoes. Tomato juice. Tomato and spaghetti sauce. Chili peppers. Onion and garlic. Horseradish. Fruits Oranges, grapefruit, and lemon (fruit and juice). Meats High-fat meats, fish, and poultry. This includes hot dogs, ribs, ham, sausage, salami, and bacon. Dairy Whole milk and chocolate milk. Sour cream. Cream. Butter. Ice cream. Cream cheese. Drinks Coffee and tea. Bubbly (carbonated) drinks or energy drinks. Condiments Hot sauce. Barbecue sauce. Sweets/Desserts Chocolate and cocoa. Donuts. Peppermint and spearmint. Fats and Oils High-fat foods. This includes [place] fries and potato chips. Other Vinegar. Strong spices. This includes black pepper, white pepper, red pepper, cayenne, curry powder, cloves, ginger, and chili powder. The items listed above may not be a complete list of foods and drinks to avoid. Contact your dietitian for more information. This information is not intended to replace advice given to you by your health care provider. Make sure you discuss any questions you have with your health care provider. Document Released: 08/19/2011 Document Revised: 07/26/2015 Document Reviewed: 12/22/2012 Elsevier Interactive Patient Education  2017 [employer].

## 2017-06-08 NOTE — Progress Notes (Signed)
    Subjective:  Frances Medina is a 57 y.o. female who presents to the [facility] today with a chief complaint of acid reflux.   HPI:  Was taken off omeprazole 1-2 months ago. Since then acid reflux has been out of control, burning and sour taste. Baking soda and water helps. Tums doesn't not help.  Previously on both omeprazole and zantac. Stomach pain, relieved by belching.  Does not eat late. Avoids greasy foods but does love decaf coffee and chocolate.  ROS: Per HPI  Objective:  Physical Exam: BP 132/62   Pulse 87   Temp 98 F (36.7 C) (Oral)   Wt 298 lb (135.2 kg)   SpO2 95%   BMI 58.20 kg/m   Gen: NAD, resting comfortably CV: RRR with no murmurs appreciated Pulm: NWOB, CTAB with no crackles, wheezes, or rhonchi GI: Normal bowel sounds present. Soft, Nondistended. TTP in epigastric area.  MSK: no edema, cyanosis, or clubbing noted Skin: warm, dry Neuro: grossly normal, moves all extremities Psych: Normal affect and thought content   Assessment/Plan:  1. Gastroesophageal reflux disease, esophagitis presence not specified Patient has had long-standing acid reflux.  Per chart review at one point she had been on omeprazole 20mg  every morning with ranitidine 150mg  every night.  It appears that since being taken off her omeprazole she has had significant acid reflux symptoms that she is also concurrently stopped her ranitidine at that time.  Discussed trial of ranitidine twice daily.  Patient also counseled on foods to avoid and given handout today - ranitidine (ZANTAC) 150 MG tablet; Take 1 tablet (150 mg total) by mouth 2 (two) times daily. TAKE 1 TABLET BY MOUTH EVERYDAY AT BEDTIME  Dispense: 60 tablet; Refill: [place], DO PGY-2, [facility] 06/08/2017 2:52 PM

## 2017-06-16 ENCOUNTER — Other Ambulatory Visit: Payer: Self-pay | Admitting: Family Medicine

## 2017-06-18 ENCOUNTER — Emergency Department (HOSPITAL_COMMUNITY)
Admission: EM | Admit: 2017-06-18 | Discharge: 2017-06-18 | Disposition: A | Discharge status: Home / Self Care | Payer: BC Managed Care – PPO | Attending: Emergency Medicine | Admitting: Emergency Medicine

## 2017-06-18 ENCOUNTER — Emergency Department (HOSPITAL_COMMUNITY): Payer: BC Managed Care – PPO

## 2017-06-18 ENCOUNTER — Other Ambulatory Visit: Payer: Self-pay

## 2017-06-18 ENCOUNTER — Telehealth: Payer: Self-pay | Admitting: *Deleted

## 2017-06-18 ENCOUNTER — Encounter (HOSPITAL_COMMUNITY): Payer: Self-pay | Admitting: Emergency Medicine

## 2017-06-18 DIAGNOSIS — Z79899 Other long term (current) drug therapy: Secondary | ICD-10-CM | POA: Diagnosis not present

## 2017-06-18 DIAGNOSIS — R7989 Other specified abnormal findings of blood chemistry: Secondary | ICD-10-CM | POA: Diagnosis not present

## 2017-06-18 DIAGNOSIS — Z87891 Personal history of nicotine dependence: Secondary | ICD-10-CM | POA: Insufficient documentation

## 2017-06-18 DIAGNOSIS — I10 Essential (primary) hypertension: Secondary | ICD-10-CM | POA: Diagnosis not present

## 2017-06-18 DIAGNOSIS — R079 Chest pain, unspecified: Secondary | ICD-10-CM | POA: Diagnosis not present

## 2017-06-18 DIAGNOSIS — R0602 Shortness of breath: Secondary | ICD-10-CM | POA: Diagnosis not present

## 2017-06-18 DIAGNOSIS — R6883 Chills (without fever): Secondary | ICD-10-CM | POA: Diagnosis not present

## 2017-06-18 DIAGNOSIS — R05 Cough: Secondary | ICD-10-CM | POA: Diagnosis not present

## 2017-06-18 DIAGNOSIS — R0789 Other chest pain: Secondary | ICD-10-CM | POA: Insufficient documentation

## 2017-06-18 LAB — BASIC METABOLIC PANEL
Anion gap: 11 (ref 5–15)
BUN: 11 mg/dL (ref 6–20)
CO2: 23 mmol/L (ref 22–32)
Calcium: 9.1 mg/dL (ref 8.9–10.3)
Chloride: 107 mmol/L (ref 101–111)
Creatinine, Ser: 0.69 mg/dL (ref 0.44–1.00)
GFR calc Af Amer: >60 mL/min (ref >60)
GFR calc non Af Amer: >60 mL/min (ref >60)
Glucose, Bld: 76 mg/dL (ref 65–99)
Potassium: 3.6 mmol/L (ref 3.5–5.1)
Sodium: 141 mmol/L (ref 135–145)

## 2017-06-18 LAB — I-STAT TROPONIN, ED
Troponin i, poc: 0 ng/mL (ref 0.00–0.08)
Troponin i, poc: 0 ng/mL (ref 0.00–0.08)

## 2017-06-18 LAB — CBC
HCT: 36.6 % (ref 36.0–46.0)
Hemoglobin: 11.1 g/dL — ABNORMAL LOW (ref 12.0–15.0)
MCH: 21.5 pg — ABNORMAL LOW (ref 26.0–34.0)
MCHC: 30.3 g/dL (ref 30.0–36.0)
MCV: 70.9 fL — ABNORMAL LOW (ref 78.0–100.0)
Platelets: 305 10*3/uL (ref 150–400)
RBC: 5.16 MIL/uL — ABNORMAL HIGH (ref 3.87–5.11)
RDW: 18.2 % — ABNORMAL HIGH (ref 11.5–15.5)
WBC: 6.3 10*3/uL (ref 4.0–10.5)

## 2017-06-18 LAB — I-STAT BETA HCG BLOOD, ED (MC, WL, AP ONLY): I-stat hCG, quantitative: <5 m[IU]/mL (ref <5)

## 2017-06-18 LAB — BRAIN NATRIURETIC PEPTIDE: B Natriuretic Peptide: 21.5 pg/mL (ref 0.0–100.0)

## 2017-06-18 LAB — D-DIMER, QUANTITATIVE (NOT AT ARMC): D-Dimer, Quant: 0.95 ug/mL-FEU — ABNORMAL HIGH (ref 0.00–0.50)

## 2017-06-18 LAB — I-STAT CG4 LACTIC ACID, ED
Lactic Acid, Venous: 1.07 mmol/L (ref 0.5–1.9)
Lactic Acid, Venous: 4.41 mmol/L (ref 0.5–1.9)

## 2017-06-18 MED ORDER — AEROCHAMBER PLUS FLO-VU LARGE MISC
1.0000 | Freq: Once | Status: AC
  Administered 2017-06-18: 1
  Filled 2017-06-18 (×2): qty 1

## 2017-06-18 MED ORDER — IPRATROPIUM BROMIDE 0.02 % IN SOLN
0.5000 mg | Freq: Once | RESPIRATORY_TRACT | Status: AC
  Administered 2017-06-18: 0.5 mg via RESPIRATORY_TRACT
  Filled 2017-06-18: qty 2.5

## 2017-06-18 MED ORDER — ALBUTEROL SULFATE (2.5 MG/3ML) 0.083% IN NEBU
5.0000 mg | INHALATION_SOLUTION | Freq: Once | RESPIRATORY_TRACT | Status: AC
  Administered 2017-06-18: 5 mg via RESPIRATORY_TRACT
  Filled 2017-06-18: qty 6

## 2017-06-18 MED ORDER — IOPAMIDOL (ISOVUE-370) INJECTION 76%
INTRAVENOUS | Status: AC
  Filled 2017-06-18: qty 100

## 2017-06-18 MED ORDER — AMOXICILLIN-POT CLAVULANATE 875-125 MG PO TABS: 1.0000 | ORAL_TABLET | Freq: Two times a day (BID) | ORAL | 0 refills | Status: DC

## 2017-06-18 MED ORDER — IOPAMIDOL (ISOVUE-370) INJECTION 76%
100.0000 mL | Freq: Once | INTRAVENOUS | Status: AC | PRN
  Administered 2017-06-18: 100 mL via INTRAVENOUS

## 2017-06-18 MED ORDER — CLARITHROMYCIN 500 MG PO TABS: 500.0000 mg | ORAL_TABLET | Freq: Two times a day (BID) | ORAL | 0 refills | Status: AC

## 2017-06-18 MED ORDER — PREDNISONE 10 MG (21) PO TBPK: ORAL_TABLET | Freq: Every day | ORAL | 0 refills | Status: DC

## 2017-06-18 MED ORDER — AMOXICILLIN-POT CLAVULANATE 875-125 MG PO TABS
1.0000 | ORAL_TABLET | Freq: Once | ORAL | Status: AC
  Administered 2017-06-18: 1 via ORAL
  Filled 2017-06-18: qty 1

## 2017-06-18 MED ORDER — CLARITHROMYCIN 500 MG PO TABS
500.0000 mg | ORAL_TABLET | Freq: Two times a day (BID) | ORAL | Status: AC
  Administered 2017-06-18: 500 mg via ORAL
  Filled 2017-06-18: qty 1

## 2017-06-18 NOTE — Telephone Encounter (Signed)
Covering for Dr. Gwendlyn Deutscher. Reviewed, agree with going to ED for evaluation. Leeanne Rio, MD

## 2017-06-18 NOTE — ED Provider Notes (Signed)
[facility] EMERGENCY DEPARTMENT Provider Note   CSN: [id number] Arrival date & time: 06/18/17  1152     History   Chief Complaint Chief Complaint  Patient presents with  . Shortness of Breath  . Chest Pain    HPI Frances Medina is a 56 y.o. female with a history of morbid obesity, OSA on CPAP, and myasthenia gravis resents to the emergency department for chief complaint of dyspnea and intimately productive cough that began 4 days ago.  Patient states the cough is worse at night and with exertion.  States she noticed bright red streaks when she coughed up mucus today.  She reports that she has become extremely short of breath with little exertion, such as going from room to room.  Denies fever, but reports she may have had some chills over the last few days.  She reports that she has been laying around more than usual due to not feeling well.  She denies palpitations, leg swelling, back pain, nausea, vomiting, diarrhea, abdominal pain.   She reports that today she developed non-radiating, constant discomfort under her right breast   There is worse with taking deep breaths.  No alleviating symptoms.  She reports that she has an albuterol nebulizer treatment at home and has used it several times over the last few days.  She states that she also has an inhaler, but is somewhat confused on when and how often she should take it.  No other treatment to arrival.  Upon initial entry into the room, the patient is extremely tachypneic and tachycardic with accessory muscle use while sitting upright in the bed.  She reports that she just ambulated back from the restroom.  She denies birth control, recent surgery, or long travel.  No history of PE or DVT.  She is a former smoker.  No history of asthma or COPD.  The history is provided by the patient. No language interpreter was used.    Past Medical History:  Diagnosis Date  . Acute respiratory failure ([place])   . Acute  respiratory failure with hypoxemia ([place])   . Aspiration into airway   . CAP (community acquired pneumonia) 08/05/2015  . Depression with anxiety   . Diverticulosis   . Dysphagia 03/2015    EGD, Dr Carlean Purl. mild antral gastritis, ? due to Ibuprofen.  no stricture but empirically maloney dilated esophagus.   . E. coli UTI 04/07/2015  . Encounter for routine gynecological examination 10/19/2015  . Endotracheally intubated   . Enteritis due to Clostridium difficile   . Fibroid uterus    size of a dime  . Gastrostomy infection ([place]) 11/08/2015  . GERD (gastroesophageal reflux disease)    hiatal hernia  . Hypertension    "went away when I stopped smoking"  . Knee injury   . Left hip pain 02/18/2017  . Migraine    "maybe couple times/month" (03/20/2015)  . Myasthenia gravis ([place]) 2017  . Myasthenia gravis with acute exacerbation ([place]) 05/15/2015  . Osteoarthritis of left knee 12/02/2013  . Osteoarthritis of right knee 08/30/2013  . Protein-calorie malnutrition, severe ([place]) 08/07/2015  . Seasonal allergies    takes Zytrec  . Tracheostomy status ([place])   . Tubular adenoma of colon   . Tumors    "in my stomach"  . Umbilical hernia    watching , no plans for surgery at present  . VAP (ventilator-associated pneumonia) [facility])     Patient Active Problem List   Diagnosis Date Noted  .  Osteoarthritis of left hip 02/18/2017  . Dyspnea 09/11/2016  . OSA on CPAP 09/11/2016  . Leg swelling 02/29/2016  . Gastrostomy infection ([place]) 11/08/2015  . Esophageal dysmotility 11/08/2015  . Urine incontinence 10/19/2015  . Restrictive lung disease 10/11/2015  . Headache 09/14/2015  . Numbness and tingling 09/14/2015  . Headache, migraine   . HTN (hypertension), benign   . Diplopia   . Dysphagia, neurologic   . Elevated LDH 05/06/2015  . Lymphadenopathy 04/07/2015  . Splenomegaly 04/07/2015  . Myasthenia gravis ([place])   . Osteoarthritis of left knee 12/02/2013  . Osteoarthritis of right knee 08/30/2013    . GERD (gastroesophageal reflux disease) 06/23/2013  . Morbid obesity ([place]) 06/23/2013  . Anxiety and depression 06/23/2013  . Fibroids 06/23/2013    Past Surgical History:  Procedure Laterality Date  . CESAREAN SECTION  1987; 1989  . COLONOSCOPY WITH PROPOFOL N/A 09/19/2013   Procedure: COLONOSCOPY WITH PROPOFOL;  Surgeon: Ladene Artist, MD;  Location: [facility] ENDOSCOPY;  Service: Endoscopy;  Laterality: N/A;  . DILATION AND CURETTAGE OF UTERUS    . ESOPHAGOGASTRODUODENOSCOPY N/A 08/10/2015   Procedure: ESOPHAGOGASTRODUODENOSCOPY (EGD);  Surgeon: Gatha Mayer, MD;  Location: [facility] ENDOSCOPY;  Service: Endoscopy;  Laterality: N/A;  . ESOPHAGOGASTRODUODENOSCOPY (EGD) WITH PROPOFOL N/A 03/21/2015   Procedure: ESOPHAGOGASTRODUODENOSCOPY (EGD) WITH PROPOFOL;  Surgeon: Gatha Mayer, MD;  Location: [facility];  Service: Endoscopy;  Laterality: N/A;  . PARTIAL KNEE ARTHROPLASTY Right 08/30/2013   Procedure: RIGHT UNICOMPARTMENTAL KNEE;  Surgeon: Johnny Bridge, MD;  Location: [facility];  Service: Orthopedics;  Laterality: Right;  . PARTIAL KNEE ARTHROPLASTY Left 12/02/2013   Procedure: LEFT KNEE UNI ARTHROPLASTY;  Surgeon: Johnny Bridge, MD;  Location: [facility];  Service: Orthopedics;  Laterality: Left;  . PEG PLACEMENT N/A 08/10/2015   Procedure: PERCUTANEOUS ENDOSCOPIC GASTROSTOMY (PEG) PLACEMENT;  Surgeon: Gatha Mayer, MD;  Location: [facility];  Service: Endoscopy;  Laterality: N/A;  . TUBAL LIGATION  1989  . VAGINAL HYSTERECTOMY  1990's?   "apparently took out one of my ovaries at the time too cause one's missing"     OB History   None      Home Medications    Prior to Admission medications   Medication Sig Start Date End Date Taking? Authorizing Provider  diclofenac (VOLTAREN) 50 MG EC tablet Take 1 tablet (50 mg total) by mouth 3 (three) times daily as needed. 04/16/17  Yes Leeanne Rio, MD  guaiFENesin (ROBITUSSIN) 100 MG/5ML SOLN Take 15 mLs by mouth  daily as needed for cough or to loosen phlegm.   Yes [provider]  Menthol (COUGH DROPS) 10 MG LOZG Take 1 lozenge by mouth daily as needed (cough).   Yes [provider]  nystatin cream (MYCOSTATIN) Apply 1 application topically 2 (two) times daily. 08/14/15  Yes Leeanne Rio, MD  ranitidine (ZANTAC) 150 MG tablet Take 1 tablet (150 mg total) by mouth 2 (two) times daily. TAKE 1 TABLET BY MOUTH EVERYDAY AT BEDTIME 06/08/17  Yes Orson Eva J, DO  sertraline (ZOLOFT) 100 MG tablet TAKE 1 TABLET BY MOUTH EVERY DAY 06/18/17  Yes Leeanne Rio, MD  traMADol (ULTRAM) 50 MG tablet Take 0.5 tablets (25 mg total) by mouth every 12 (twelve) hours as needed for moderate pain or severe pain. 04/23/17  Yes McDiarmid, Blane Ohara, MD  albuterol (PROAIR HFA) 108 (90 Base) MCG/ACT inhaler Inhale 2 puffs into the lungs every 4 (four) hours as needed for wheezing  or shortness of breath. 11/06/16   [name], MD  amoxicillin-clavulanate (AUGMENTIN) 875-125 MG tablet Take 1 tablet by mouth every 12 (twelve) hours for 5 days. 06/18/17 06/23/17  McDonald, Mia A, PA-C  clarithromycin (BIAXIN) 500 MG tablet Take 1 tablet (500 mg total) by mouth 2 (two) times daily for 5 days. 06/18/17 06/23/17  McDonald, Mia A, PA-C  clonazePAM (KLONOPIN) 1 MG tablet TAKE 1 TABLET BY MOUTH TWICE DAILY AS NEEDED FOR ANXIETY Patient taking differently: take 1 tablet by mouth daily 01/07/17   Andrena Mews T, MD  predniSONE (STERAPRED UNI-PAK 21 TAB) 10 MG (21) TBPK tablet Take by mouth daily. Take 6 tabs by mouth daily  for 2 days, then 5 tabs for 2 days, then 4 tabs for 2 days, then 3 tabs for 2 days, 2 tabs for 2 days, then 1 tab by mouth daily for 2 days 06/18/17   Joanne Gavel, PA-C    Family History Family History  Problem Relation Age of Onset  . Heart disease Mother 38  . Hypertension Mother   . Stroke Father   . Hypertension Father   . Other Sister        Esophageal strictures  . Anemia Sister    . COPD Brother   . Leukemia Sister   . Leukemia Sister   . Colon cancer Neg Hx     Social History Social History   Tobacco Use  . Smoking status: Former Smoker    Packs/day: 0.50    Years: 38.00    Pack years: 19.00    Types: Cigarettes    Start date: 03/03/1974    Last attempt to quit: 05/14/2012    Years since quitting: 5.1  . Smokeless tobacco: Never Used  . Tobacco comment: denies thoughts of restarting  Substance Use Topics  . Alcohol use: No    Alcohol/week: 0.0 oz  . Drug use: No     Allergies   Ciprofloxacin; Shrimp [shellfish allergy]; Dicyclomine; Methocarbamol; and Penicillins   Review of Systems Review of Systems  Constitutional: Positive for chills. Negative for activity change, diaphoresis and fever.  HENT: Negative for congestion.   Eyes: Negative for visual disturbance.  Respiratory: Positive for cough, shortness of breath and wheezing.   Cardiovascular: Positive for chest pain.  Gastrointestinal: Negative for abdominal pain, diarrhea, nausea and vomiting.  Genitourinary: Negative for dysuria and flank pain.  Musculoskeletal: Negative for back pain and neck stiffness.  Skin: Negative for rash.  Allergic/Immunologic: Negative for immunocompromised state.  Neurological: Negative for weakness and headaches.  Psychiatric/Behavioral: Negative for confusion.    Physical Exam Updated Vital Signs BP 126/81 (BP Location: Right Arm)   Pulse 99   Temp 98.6 F (37 C) (Oral)   Resp 18   Ht 5' (1.524 m)   Wt 135.6 kg (299 lb)   SpO2 99%   BMI 58.39 kg/m   Physical Exam  Constitutional:  Non-toxic appearance. She does not appear ill. No distress.  Morbidly obese  HENT:  Head: Normocephalic and atraumatic.  Eyes: Conjunctivae are normal.  Neck: Normal range of motion. Neck supple. No tracheal deviation present.  Cardiovascular: Regular rhythm. Tachycardia present. Exam reveals no gallop and no friction rub.  No murmur heard. Pulmonary/Chest:  Accessory muscle usage present. No stridor. Tachypnea noted. No respiratory distress. She has no decreased breath sounds. She has wheezes. She has rhonchi in the right lower field. She has no rales. She exhibits tenderness.  Reproducible tenderness to palpation below the  right breast, right lateral ribs, and right mid back.  No left-sided tenderness.  No deformities, crepitus, step-offs.  Abdominal: Soft. Bowel sounds are normal. She exhibits no distension and no mass. There is no tenderness. There is no rebound and no guarding. No hernia.  Musculoskeletal: Normal range of motion. She exhibits no edema, tenderness or deformity.  No lower extremity edema.  Neurological: She is alert.  Skin: Skin is warm. No rash noted.  Psychiatric: Her behavior is normal.  Nursing note and vitals reviewed.  ED Treatments / Results  Labs (all labs ordered are listed, but only abnormal results are displayed) Labs Reviewed  CBC - Abnormal; Notable for the following components:      Result Value   RBC 5.16 (*)    Hemoglobin 11.1 (*)    MCV 70.9 (*)    MCH 21.5 (*)    RDW 18.2 (*)    All other components within normal limits  D-DIMER, QUANTITATIVE (NOT AT [facility]) - Abnormal; Notable for the following components:   D-Dimer, Quant 0.95 (*)    All other components within normal limits  I-STAT CG4 LACTIC ACID, ED - Abnormal; Notable for the following components:   Lactic Acid, Venous 4.41 (*)    All other components within normal limits  BASIC METABOLIC PANEL  BRAIN NATRIURETIC PEPTIDE  I-STAT TROPONIN, ED  I-STAT BETA HCG BLOOD, ED ([facility], [facility], [facility] ONLY)  I-STAT CG4 LACTIC ACID, ED  I-STAT TROPONIN, ED    EKG None  Radiology Dg Chest 2 View  Result Date: 06/18/2017 CLINICAL DATA:  Chest pain EXAM: CHEST - 2 VIEW COMPARISON:  09/03/2016 FINDINGS: Mild bibasilar airspace disease unchanged from prior studies and most likely chronic atelectasis or scarring. No superimposed acute infiltrate effusion. Negative  for heart failure. IMPRESSION: Chronic bibasilar atelectasis/scarring. No acute cardiopulmonary abnormality. Electronically Signed   By: Franchot Gallo M.D.   On: 06/18/2017 12:43   Ct Angio Chest Pe W And/or Wo Contrast  Result Date: 06/18/2017 CLINICAL DATA:  Concern for pulmonary embolism.  Positive D-dimer. EXAM: CT ANGIOGRAPHY CHEST WITH CONTRAST TECHNIQUE: Multidetector CT imaging of the chest was performed using the standard protocol during bolus administration of intravenous contrast. Multiplanar CT image reconstructions and MIPs were obtained to evaluate the vascular anatomy. CONTRAST:  174mL ISOVUE-370 IOPAMIDOL (ISOVUE-370) INJECTION 76% COMPARISON:  None. FINDINGS: Initial study was limited due to poor concentration of the contrast bolus in the pulmonary arteries as well as body habitus. The second attempt should little improvement. Therefore the exam is limited evaluation of the peripheral/segmental pulmonary arteries. Cardiovascular: No filling defects in the proximal pulmonary arteries. Distal pulmonary arteries are poorly evaluated as above. No acute findings aorta great vessels Mediastinum/Nodes: No axillary or supraclavicular adenopathy. No mediastinal hilar adenopathy. Lungs/Pleura: Focus of consolidation/atelectasis in the RIGHT lower lobe. Bandlike atelectasis in LEFT lower lobe to a lesser degree. No pulmonary edema. No infarction Upper Abdomen: Limited view of the liver, kidneys, pancreas are unremarkable. Normal adrenal glands. Musculoskeletal: No aggressive osseous lesion. Review of the MIP images confirms the above findings. IMPRESSION: 1. No evidence of central pulmonary embolism on limited exam. Patient is a poor candidate for CTPA. Consider nuclear medicine ventilation perfusion scan if continued concern pulmonary embolism. 2. Basilar atelectasis greater on the RIGHT. Electronically Signed   By: Suzy Bouchard M.D.   On: 06/18/2017 21:16    Procedures Procedures (including  critical care time)  Medications Ordered in ED Medications  iopamidol (ISOVUE-370) 76 % injection (has no administration in  time range)  iopamidol (ISOVUE-370) 76 % injection (has no administration in time range)  albuterol (PROVENTIL) (2.5 MG/3ML) 0.083% nebulizer solution 5 mg (5 mg Nebulization Given 06/18/17 1214)  albuterol (PROVENTIL) (2.5 MG/3ML) 0.083% nebulizer solution 5 mg (5 mg Nebulization Given 06/18/17 1920)  ipratropium (ATROVENT) nebulizer solution 0.5 mg (0.5 mg Nebulization Given 06/18/17 1921)  iopamidol (ISOVUE-370) 76 % injection 100 mL (100 mLs Intravenous Contrast Given 06/18/17 2031)  clarithromycin (BIAXIN) tablet 500 mg (500 mg Oral Given 06/18/17 2245)  amoxicillin-clavulanate (AUGMENTIN) 875-125 MG per tablet 1 tablet (1 tablet Oral Given 06/18/17 2244)  AEROCHAMBER PLUS FLO-VU LARGE MISC 1 each (1 each Other Provided for home use 06/18/17 2246)     Initial Impression / Assessment and Plan / ED Course  I have reviewed the triage vital signs and the nursing notes.  Pertinent labs & imaging results that were available during my care of the patient were reviewed by me and considered in my medical decision making (see chart for details).     57 year old female with a history of myasthenia gravis, OSA on CPAP, and morbid obesity presenting with dyspnea on exertion, right-sided chest discomfort, and intermittent productive cough that began to have red streaks in it earlier today.  The patient was discussed with Dr. Lita Mains, attending physician.  Tachycardic in the 100s on arrival.  SaO2 at 97 to 99% on room air.  Afebrile.  On initial exam, the patient had just ambulated from the restroom and was tachycardic, tachypneic, with accessory muscle use.  Lungs with diffuse inspiratory and expiratory wheezes and right lower lobe rhonchi.  She was given a nebulizer treatment with solution of the wheezing kept for end expiratory wheezing.   No leukocytosis on CBC.  Troponin is  negative.  EKG with no acute changes.  CXR with mild chronic bibasilar atelectasis.  D-dimer is elevated, but CT PE study is negative for PE, but there is a focus of consolidation in the right lower lobe.   Discussed these findings with the patient.  I suspect there is a component of obesity hypoventilation syndrome.  Given concern for right lower lobe consolidation, will treat for CAP.  Will treat with clarithromycin and Augmentin due to concern for exacerbation of myasthenia gravis with doxycycline.  She was given a spacer for her albuterol inhaler and educated on when to use the inhaler versus the nebulizer treatments and how frequently.  She was ambulated through the department and maintained SaO2 in the high 90s with mild increase in tachycardia.  She states that she is feeling much better.  She is hemodynamically stable and in no acute distress at this time.  We will discharge the patient home with outpatient follow-up.  Strict return precautions given.  She is safe for outpatient follow-up at this time.  Final Clinical Impressions(s) / ED Diagnoses   Final diagnoses:  Shortness of breath    ED Discharge Orders        Ordered    amoxicillin-clavulanate (AUGMENTIN) 875-125 MG tablet  Every 12 hours     06/18/17 2222    clarithromycin (BIAXIN) 500 MG tablet  2 times daily     06/18/17 2222    predniSONE (STERAPRED UNI-PAK 21 TAB) 10 MG (21) TBPK tablet  Daily     06/18/17 2222       Joline Maxcy A, PA-C 06/19/17 0105    Julianne Rice, MD 06/22/17 [phone]

## 2017-06-18 NOTE — ED Notes (Signed)
Called lab to add on blood work 

## 2017-06-18 NOTE — ED Notes (Signed)
ED Provider at bedside. 

## 2017-06-18 NOTE — Discharge Instructions (Addendum)
Take 1 tablet of Augmentin every 12 hours for the next 5 days.  Take 1 tablet of clarithromycin every 12 hours for the next 5 days.  For prednisone: take 6 tabs by mouth daily  for 2 days, then 5 tabs for 2 days, then 4 tabs for 2 days, then 3 tabs for 2 days, 2 tabs for 2 days, then 1 tab by mouth daily for 2 days.  For shortness of breath, you can use 2 puffs of your albuterol inhaler with a spacer or one nebulizer treatment every 4 hours as needed for shortness of breath.  Please do not use both of these medications only one or the other.  For fever or pain, take at thousand milligrams of Tylenol once every 8 hours.  Call and schedule follow-up appointment with your primary care provider when they reopen in 3 to 4 days.  If you develop any new or worsening symptoms including respiratory distress, chest pain that worsens, becomes constant, fever that does not improve with Tylenol, difficulty breathing, or other concerning symptoms, please return to the emergency department for reevaluation.

## 2017-06-18 NOTE — ED Triage Notes (Addendum)
Pt states centralized chest pain and shortness of breath since Monday. Pt has hx of myasthenia gravis. RLL has ronchi noted. Pt also has a cough with productive mucous with some red streaks in it.

## 2017-06-18 NOTE — ED Notes (Signed)
Pt ambulated approximately 40 ft total down hallway and back to room. Pt's O2 stayed between 97%-99%, but her heart rate went from 95 to 129 bpm. PA is aware.

## 2017-06-18 NOTE — ED Notes (Signed)
Patient transported to CT 

## 2017-06-18 NOTE — Telephone Encounter (Signed)
Patient concerned with an ongoing cough that hasn't improved through the week.  She states that it started Monday and has only gotten worse.  She is using her inhalers along with her nebulizer treatments and is getting no relief.  Patient states that her cough is productive and this morning had some blood in it.  She denies any pain or chills.  She is experiencing SOB with little exertion and is just concerned for what could be causing this.  Informed patient that she should seek care especially before the long weekend since our office is closed and she opted to go to the ED.  Informed patient that we could try to work her in here this afternoon but that it could be a wait.  She was fine with going to the ED.  Will forward to MD to make her aware. Jazmin Hartsell,CMA

## 2017-06-18 NOTE — Telephone Encounter (Signed)
Blue team, please ask patient to schedule follow up with PCP (she now has availability in May) Thanks Leeanne Rio, MD

## 2017-06-18 NOTE — ED Notes (Signed)
Pt back in room from CT 

## 2017-06-21 ENCOUNTER — Other Ambulatory Visit: Payer: Self-pay | Admitting: Family Medicine

## 2017-06-22 ENCOUNTER — Encounter: Payer: Self-pay | Admitting: Internal Medicine

## 2017-06-22 ENCOUNTER — Ambulatory Visit (INDEPENDENT_AMBULATORY_CARE_PROVIDER_SITE_OTHER): Payer: BC Managed Care – PPO | Admitting: Internal Medicine

## 2017-06-22 VITALS — BP 139/80 | HR 79 | Temp 98.2°F | Wt 295.6 lb

## 2017-06-22 DIAGNOSIS — J4520 Mild intermittent asthma, uncomplicated: Secondary | ICD-10-CM

## 2017-06-22 DIAGNOSIS — R059 Cough, unspecified: Secondary | ICD-10-CM

## 2017-06-22 DIAGNOSIS — R05 Cough: Secondary | ICD-10-CM | POA: Diagnosis not present

## 2017-06-22 HISTORY — DX: Mild intermittent asthma, uncomplicated: J45.20

## 2017-06-22 MED ORDER — ALBUTEROL SULFATE HFA 108 (90 BASE) MCG/ACT IN AERS: 2.0000 | INHALATION_SPRAY | RESPIRATORY_TRACT | 4 refills | Status: DC | PRN

## 2017-06-22 MED ORDER — FLUTICASONE PROPIONATE 50 MCG/ACT NA SUSP
2.0000 | Freq: Every day | NASAL | 6 refills | Status: DC
Start: 2017-06-22 — End: 2017-11-23

## 2017-06-22 MED ORDER — ALBUTEROL SULFATE (2.5 MG/3ML) 0.083% IN NEBU
2.5000 mg | INHALATION_SOLUTION | RESPIRATORY_TRACT | 0 refills | Status: DC | PRN
Start: 2017-06-22 — End: 2017-06-24

## 2017-06-22 MED ORDER — BENZONATATE 100 MG PO CAPS: 100.0000 mg | ORAL_CAPSULE | Freq: Two times a day (BID) | ORAL | 0 refills | Status: DC | PRN

## 2017-06-22 NOTE — Assessment & Plan Note (Addendum)
Coughing improved.  Patient seen in the ED and started on clarithromycin and prednisone.   Review of last pulmonology note she does have some mild intermittent asthma.  Likely her cough and dyspnea is from viral respiratory infection she developed on last week.  Given she does have some intermittent asthma cough may take some time to resolve -Continue clarithromycin as patient with a history of immunosuppression -Continue prednisone -Provide patient with Flonase, Tessalon Perles, albuterol nebulizer -Follow-up in 2 to 3 days if she worsens or does not seem to be improving.

## 2017-06-22 NOTE — Progress Notes (Signed)
   [facility] Kerrin Mo, MD Phone: [phone]  Reason For Visit: Hospital Follow up    # ED Follow up  Patient has had cold like symptoms over the past week. Patient noted some red speckled pheglm and went to the ED. She did not have an appetite that day. Overall patient feels like she improving. Patient has been taking prednisone does not feel like it has made much differnence Patient coughed all night for a couple of nights. Though she states that last night she had no cough wearing her CPAP machine. Patient notes she is congested in her nose. Patient has been taking certizine. Patient has been Robitissun for the cough. Patient states the cough has gotten better. Patient still has difficult with walking and breathing.   Immunocompromised: Yes, taking Mycepholate for Myasthenia Gravis - these symptoms are nothing like her symptoms for MG.  She does have a history of smoking in the past-quit about 8 years ago She has been seen by pulmonology and was diagnosed with mild intermittent asthma She does take her rantidine regularly   Past Medical History Reviewed problem list.  Medications- reviewed and updated No additions to family history Social history- patient is a non-smoker  Objective: BP 139/80 (BP Location: Left Arm, Patient Position: Sitting, Cuff Size: Large)   Pulse 79   Temp 98.2 F (36.8 C) (Oral)   Wt 295 lb 9.6 oz (134.1 kg)   SpO2 98%   BMI 57.73 kg/m  Gen: NAD, alert, cooperative with exam HEENT: Normal    Neck: No masses palpated. No lymphadenopathy    Nose: nasal turbinates slight erythematous     Throat: moist mucus membranes, no erythema Cardio: regular rate and rhythm, S1S2 heard, no murmurs appreciated Pulm: clear to auscultation bilaterally, no wheezes, rhonchi or rales Skin: dry, intact, no rashes or lesions  Assessment/Plan: See problem based a/p  Cough Coughing improved.  Patient seen in the ED and started on clarithromycin  and prednisone.   Review of last pulmonology note she does have some mild intermittent asthma.  Likely her cough and dyspnea is from viral respiratory infection she developed on last week.  Given she does have some intermittent asthma cough may take some time to resolve -Continue clarithromycin as patient with a history of immunosuppression -Continue prednisone -Provide patient with Flonase, Tessalon Perles, albuterol nebulizer -Follow-up in 2 to 3 days if she worsens or does not seem to be improving.

## 2017-06-22 NOTE — Patient Instructions (Addendum)
Nice to see you today.  I want you to continue the prednisone and antibiotics as was prescribed.  I am also going to refill your nebulizer machine.  For your cough I will give you some Tessalon Perles please take these. I want you to use the Flonase as well daily to help with post-nasal drip. If no improvement in 2-3 days, please return for follow up

## 2017-07-06 ENCOUNTER — Other Ambulatory Visit: Payer: Self-pay | Admitting: *Deleted

## 2017-07-06 DIAGNOSIS — R059 Cough, unspecified: Secondary | ICD-10-CM

## 2017-07-06 DIAGNOSIS — R05 Cough: Secondary | ICD-10-CM

## 2017-07-07 MED ORDER — BENZONATATE 100 MG PO CAPS: 100.0000 mg | ORAL_CAPSULE | Freq: Two times a day (BID) | ORAL | 0 refills | Status: DC | PRN

## 2017-07-08 ENCOUNTER — Other Ambulatory Visit: Payer: Self-pay | Admitting: Family Medicine

## 2017-07-13 ENCOUNTER — Other Ambulatory Visit: Payer: Self-pay | Admitting: Family Medicine

## 2017-07-25 ENCOUNTER — Inpatient Hospital Stay (HOSPITAL_COMMUNITY)
Admission: EM | Admit: 2017-07-25 | Discharge: 2017-07-28 | DRG: 440 | Disposition: A | Discharge status: Home / Self Care | Payer: BC Managed Care – PPO | Attending: Family Medicine | Admitting: Family Medicine

## 2017-07-25 ENCOUNTER — Encounter (HOSPITAL_COMMUNITY): Payer: Self-pay | Admitting: Emergency Medicine

## 2017-07-25 ENCOUNTER — Inpatient Hospital Stay (HOSPITAL_COMMUNITY): Payer: BC Managed Care – PPO

## 2017-07-25 ENCOUNTER — Emergency Department (HOSPITAL_COMMUNITY): Payer: BC Managed Care – PPO

## 2017-07-25 DIAGNOSIS — J4520 Mild intermittent asthma, uncomplicated: Secondary | ICD-10-CM | POA: Diagnosis present

## 2017-07-25 DIAGNOSIS — I10 Essential (primary) hypertension: Secondary | ICD-10-CM | POA: Diagnosis present

## 2017-07-25 DIAGNOSIS — F329 Major depressive disorder, single episode, unspecified: Secondary | ICD-10-CM | POA: Diagnosis present

## 2017-07-25 DIAGNOSIS — M1612 Unilateral primary osteoarthritis, left hip: Secondary | ICD-10-CM | POA: Diagnosis present

## 2017-07-25 DIAGNOSIS — Z881 Allergy status to other antibiotic agents status: Secondary | ICD-10-CM

## 2017-07-25 DIAGNOSIS — R51 Headache: Secondary | ICD-10-CM | POA: Diagnosis present

## 2017-07-25 DIAGNOSIS — K219 Gastro-esophageal reflux disease without esophagitis: Secondary | ICD-10-CM | POA: Diagnosis present

## 2017-07-25 DIAGNOSIS — T402X5A Adverse effect of other opioids, initial encounter: Secondary | ICD-10-CM | POA: Diagnosis not present

## 2017-07-25 DIAGNOSIS — F419 Anxiety disorder, unspecified: Secondary | ICD-10-CM | POA: Diagnosis present

## 2017-07-25 DIAGNOSIS — Z87891 Personal history of nicotine dependence: Secondary | ICD-10-CM | POA: Diagnosis not present

## 2017-07-25 DIAGNOSIS — R1013 Epigastric pain: Secondary | ICD-10-CM | POA: Diagnosis not present

## 2017-07-25 DIAGNOSIS — Z91013 Allergy to seafood: Secondary | ICD-10-CM

## 2017-07-25 DIAGNOSIS — K449 Diaphragmatic hernia without obstruction or gangrene: Secondary | ICD-10-CM | POA: Diagnosis present

## 2017-07-25 DIAGNOSIS — G7000 Myasthenia gravis without (acute) exacerbation: Secondary | ICD-10-CM | POA: Diagnosis present

## 2017-07-25 DIAGNOSIS — K859 Acute pancreatitis without necrosis or infection, unspecified: Secondary | ICD-10-CM

## 2017-07-25 DIAGNOSIS — G4733 Obstructive sleep apnea (adult) (pediatric): Secondary | ICD-10-CM | POA: Diagnosis present

## 2017-07-25 DIAGNOSIS — K5900 Constipation, unspecified: Secondary | ICD-10-CM | POA: Diagnosis present

## 2017-07-25 DIAGNOSIS — Z7952 Long term (current) use of systemic steroids: Secondary | ICD-10-CM

## 2017-07-25 DIAGNOSIS — K8020 Calculus of gallbladder without cholecystitis without obstruction: Secondary | ICD-10-CM | POA: Diagnosis not present

## 2017-07-25 DIAGNOSIS — D509 Iron deficiency anemia, unspecified: Secondary | ICD-10-CM | POA: Diagnosis not present

## 2017-07-25 DIAGNOSIS — R112 Nausea with vomiting, unspecified: Secondary | ICD-10-CM | POA: Diagnosis not present

## 2017-07-25 DIAGNOSIS — Z79899 Other long term (current) drug therapy: Secondary | ICD-10-CM

## 2017-07-25 DIAGNOSIS — Z96653 Presence of artificial knee joint, bilateral: Secondary | ICD-10-CM | POA: Diagnosis present

## 2017-07-25 DIAGNOSIS — R0602 Shortness of breath: Secondary | ICD-10-CM | POA: Diagnosis not present

## 2017-07-25 DIAGNOSIS — Z880 Allergy status to penicillin: Secondary | ICD-10-CM

## 2017-07-25 DIAGNOSIS — Z888 Allergy status to other drugs, medicaments and biological substances status: Secondary | ICD-10-CM | POA: Diagnosis not present

## 2017-07-25 HISTORY — DX: Acute pancreatitis without necrosis or infection, unspecified: K85.90

## 2017-07-25 LAB — LIPID PANEL
Cholesterol: 161 mg/dL (ref 0–200)
HDL: 60 mg/dL
LDL Cholesterol: 92 mg/dL (ref 0–99)
Total CHOL/HDL Ratio: 2.7 ratio
Triglycerides: 44 mg/dL
VLDL: 9 mg/dL (ref 0–40)

## 2017-07-25 LAB — COMPREHENSIVE METABOLIC PANEL
ALT: 24 U/L (ref 14–54)
AST: 40 U/L (ref 15–41)
Albumin: 3.4 g/dL — ABNORMAL LOW (ref 3.5–5.0)
Alkaline Phosphatase: 121 U/L (ref 38–126)
Anion gap: 9 (ref 5–15)
BUN: 12 mg/dL (ref 6–20)
CO2: 28 mmol/L (ref 22–32)
Calcium: 9.3 mg/dL (ref 8.9–10.3)
Chloride: 105 mmol/L (ref 101–111)
Creatinine, Ser: 0.75 mg/dL (ref 0.44–1.00)
GFR calc Af Amer: >60 mL/min (ref >60)
GFR calc non Af Amer: >60 mL/min (ref >60)
Glucose, Bld: 93 mg/dL (ref 65–99)
Potassium: 3.7 mmol/L (ref 3.5–5.1)
Sodium: 142 mmol/L (ref 135–145)
Total Bilirubin: 0.5 mg/dL (ref 0.3–1.2)
Total Protein: 6.7 g/dL (ref 6.5–8.1)

## 2017-07-25 LAB — URINALYSIS, ROUTINE W REFLEX MICROSCOPIC
Bilirubin Urine: NEGATIVE
Glucose, UA: NEGATIVE mg/dL
Hgb urine dipstick: NEGATIVE
Ketones, ur: NEGATIVE mg/dL
Leukocytes, UA: NEGATIVE
Nitrite: NEGATIVE
Protein, ur: NEGATIVE mg/dL
Specific Gravity, Urine: 1.004 — ABNORMAL LOW (ref 1.005–1.030)
pH: 7 (ref 5.0–8.0)

## 2017-07-25 LAB — I-STAT TROPONIN, ED: Troponin i, poc: 0.01 ng/mL (ref 0.00–0.08)

## 2017-07-25 LAB — CBC
HCT: 37.3 % (ref 36.0–46.0)
Hemoglobin: 10.9 g/dL — ABNORMAL LOW (ref 12.0–15.0)
MCH: 20.8 pg — ABNORMAL LOW (ref 26.0–34.0)
MCHC: 29.2 g/dL — ABNORMAL LOW (ref 30.0–36.0)
MCV: 71.2 fL — ABNORMAL LOW (ref 78.0–100.0)
Platelets: 353 10*3/uL (ref 150–400)
RBC: 5.24 MIL/uL — ABNORMAL HIGH (ref 3.87–5.11)
RDW: 19.6 % — ABNORMAL HIGH (ref 11.5–15.5)
WBC: 9.5 10*3/uL (ref 4.0–10.5)

## 2017-07-25 LAB — IRON AND TIBC
Iron: 23 ug/dL — ABNORMAL LOW (ref 28–170)
Saturation Ratios: 6 % — ABNORMAL LOW (ref 10.4–31.8)
TIBC: 384 ug/dL (ref 250–450)
UIBC: 361 ug/dL

## 2017-07-25 LAB — FERRITIN: Ferritin: 45 ng/mL (ref 11–307)

## 2017-07-25 LAB — HEMOGLOBIN A1C
Hgb A1c MFr Bld: 6 % — ABNORMAL HIGH (ref 4.8–5.6)
Mean Plasma Glucose: 125.5 mg/dL

## 2017-07-25 LAB — LIPASE, BLOOD: Lipase: 2587 U/L — ABNORMAL HIGH (ref 11–51)

## 2017-07-25 MED ORDER — ACETAMINOPHEN 650 MG RE SUPP: 650.0000 mg | Freq: Four times a day (QID) | RECTAL | Status: DC | PRN

## 2017-07-25 MED ORDER — HYDROMORPHONE HCL 2 MG/ML IJ SOLN
1.0000 mg | Freq: Once | INTRAMUSCULAR | Status: AC | PRN
  Administered 2017-07-25: 1 mg via INTRAVENOUS
  Filled 2017-07-25: qty 1

## 2017-07-25 MED ORDER — SODIUM CHLORIDE 0.9 % IV BOLUS
1000.0000 mL | Freq: Once | INTRAVENOUS | Status: AC
Start: 2017-07-25 — End: 2017-07-25
  Administered 2017-07-25: 1000 mL via INTRAVENOUS

## 2017-07-25 MED ORDER — ONDANSETRON HCL 4 MG/2ML IJ SOLN
4.0000 mg | Freq: Four times a day (QID) | INTRAMUSCULAR | Status: DC | PRN
  Administered 2017-07-25: 4 mg via INTRAVENOUS
  Filled 2017-07-25: qty 2

## 2017-07-25 MED ORDER — MORPHINE SULFATE (PF) 4 MG/ML IV SOLN
4.0000 mg | Freq: Once | INTRAVENOUS | Status: AC
  Administered 2017-07-25: 4 mg via INTRAVENOUS
  Filled 2017-07-25: qty 1

## 2017-07-25 MED ORDER — FAMOTIDINE 20 MG PO TABS: 20.0000 mg | ORAL_TABLET | Freq: Two times a day (BID) | ORAL | Status: DC

## 2017-07-25 MED ORDER — ONDANSETRON HCL 4 MG/2ML IJ SOLN
4.0000 mg | Freq: Once | INTRAMUSCULAR | Status: AC
  Administered 2017-07-25: 4 mg via INTRAVENOUS
  Filled 2017-07-25: qty 2

## 2017-07-25 MED ORDER — ACETAMINOPHEN 325 MG PO TABS
650.0000 mg | ORAL_TABLET | Freq: Four times a day (QID) | ORAL | Status: DC | PRN
  Administered 2017-07-26 – 2017-07-28 (×4): 650 mg via ORAL
  Filled 2017-07-25 (×4): qty 2

## 2017-07-25 MED ORDER — LORATADINE 10 MG PO TABS
10.0000 mg | ORAL_TABLET | Freq: Every day | ORAL | Status: DC
  Administered 2017-07-26 – 2017-07-28 (×3): 10 mg via ORAL
  Filled 2017-07-25 (×3): qty 1

## 2017-07-25 MED ORDER — ONDANSETRON HCL 4 MG PO TABS
4.0000 mg | ORAL_TABLET | Freq: Four times a day (QID) | ORAL | Status: DC | PRN
  Administered 2017-07-26: 4 mg via ORAL
  Filled 2017-07-25: qty 1

## 2017-07-25 MED ORDER — PREDNISONE 10 MG PO TABS
10.0000 mg | ORAL_TABLET | Freq: Every day | ORAL | Status: DC
  Filled 2017-07-25 (×2): qty 1

## 2017-07-25 MED ORDER — SODIUM CHLORIDE 0.9 % IV BOLUS
1000.0000 mL | Freq: Once | INTRAVENOUS | Status: AC
  Administered 2017-07-25: 1000 mL via INTRAVENOUS

## 2017-07-25 MED ORDER — POLYETHYLENE GLYCOL 3350 17 G PO PACK: 17.0000 g | PACK | Freq: Every day | ORAL | Status: DC | PRN

## 2017-07-25 MED ORDER — FAMOTIDINE IN NACL 20-0.9 MG/50ML-% IV SOLN
20.0000 mg | Freq: Two times a day (BID) | INTRAVENOUS | Status: DC
  Administered 2017-07-25 – 2017-07-27 (×5): 20 mg via INTRAVENOUS
  Filled 2017-07-25 (×6): qty 50

## 2017-07-25 MED ORDER — ALBUTEROL SULFATE (2.5 MG/3ML) 0.083% IN NEBU: 2.5000 mg | INHALATION_SOLUTION | RESPIRATORY_TRACT | Status: DC | PRN

## 2017-07-25 MED ORDER — MORPHINE SULFATE (PF) 4 MG/ML IV SOLN: 4.0000 mg | INTRAVENOUS | Status: DC | PRN

## 2017-07-25 MED ORDER — BENZONATATE 100 MG PO CAPS: 100.0000 mg | ORAL_CAPSULE | Freq: Two times a day (BID) | ORAL | Status: DC | PRN

## 2017-07-25 MED ORDER — SERTRALINE HCL 100 MG PO TABS
100.0000 mg | ORAL_TABLET | Freq: Every day | ORAL | Status: DC
  Administered 2017-07-26 – 2017-07-28 (×3): 100 mg via ORAL
  Filled 2017-07-25 (×4): qty 1

## 2017-07-25 MED ORDER — CLONAZEPAM 1 MG PO TABS
1.0000 mg | ORAL_TABLET | Freq: Two times a day (BID) | ORAL | Status: DC
Start: 2017-07-25 — End: 2017-07-28
  Administered 2017-07-25 – 2017-07-28 (×6): 1 mg via ORAL
  Filled 2017-07-25 (×6): qty 1

## 2017-07-25 MED ORDER — MORPHINE SULFATE (PF) 2 MG/ML IV SOLN
2.0000 mg | INTRAVENOUS | Status: DC | PRN
  Administered 2017-07-25 – 2017-07-27 (×3): 2 mg via INTRAVENOUS
  Filled 2017-07-25 (×3): qty 1

## 2017-07-25 MED ORDER — MYCOPHENOLATE MOFETIL 500 MG PO TABS
1000.0000 mg | ORAL_TABLET | Freq: Two times a day (BID) | ORAL | Status: DC
  Administered 2017-07-25: 1000 mg via ORAL
  Filled 2017-07-25 (×2): qty 2

## 2017-07-25 MED ORDER — ENOXAPARIN SODIUM 40 MG/0.4ML ~~LOC~~ SOLN
40.0000 mg | SUBCUTANEOUS | Status: DC
  Administered 2017-07-25 – 2017-07-27 (×3): 40 mg via SUBCUTANEOUS
  Filled 2017-07-25 (×3): qty 0.4

## 2017-07-25 MED ORDER — IOHEXOL 300 MG/ML  SOLN
100.0000 mL | Freq: Once | INTRAMUSCULAR | Status: AC | PRN
  Administered 2017-07-25: 100 mL via INTRAVENOUS

## 2017-07-25 MED ORDER — GUAIFENESIN 100 MG/5ML PO SOLN: 15.0000 mL | Freq: Every day | ORAL | Status: DC | PRN

## 2017-07-25 MED ORDER — SODIUM CHLORIDE 0.9 % IV SOLN
INTRAVENOUS | Status: DC
  Administered 2017-07-25 – 2017-07-28 (×6): via INTRAVENOUS

## 2017-07-25 NOTE — ED Provider Notes (Signed)
[facility] EMERGENCY DEPARTMENT Provider Note   CSN: [id number] Arrival date & time: 07/25/17  2595     History   Chief Complaint Chief Complaint  Patient presents with  . Abdominal Pain    HPI Frances Medina is a 57 y.o. female with past medical history significant for myasthenia gravis, depression/anxiety, hypertension, GERD, presenting with severe epigastric pain starting after breakfast this morning.  She reports that her pain initially started on 07/15/2017 but has been bearable and progressed until this morning where it was unbearable.  Reports mild nausea but no vomiting.  She had been trying ibuprofen and Tums without some relief.  Patient reports a hard stool bowel movement this morning.  She denies any fever, chills, dysuria, hematuria.  HPI  Past Medical History:  Diagnosis Date  . Acute respiratory failure ([place])   . Acute respiratory failure with hypoxemia ([place])   . Aspiration into airway   . CAP (community acquired pneumonia) 08/05/2015  . Depression with anxiety   . Diverticulosis   . Dysphagia 03/2015    EGD, Dr Carlean Purl. mild antral gastritis, ? due to Ibuprofen.  no stricture but empirically maloney dilated esophagus.   . E. coli UTI 04/07/2015  . Encounter for routine gynecological examination 10/19/2015  . Endotracheally intubated   . Enteritis due to Clostridium difficile   . Fibroid uterus    size of a dime  . Gastrostomy infection ([place]) 11/08/2015  . GERD (gastroesophageal reflux disease)    hiatal hernia  . Hypertension    "went away when I stopped smoking"  . Knee injury   . Left hip pain 02/18/2017  . Migraine    "maybe couple times/month" (03/20/2015)  . Mild intermittent asthma 06/22/2017  . Myasthenia gravis ([place]) 2017  . Myasthenia gravis with acute exacerbation ([place]) 05/15/2015  . Osteoarthritis of left knee 12/02/2013  . Osteoarthritis of right knee 08/30/2013  . Protein-calorie malnutrition, severe ([place]) 08/07/2015  . Seasonal  allergies    takes Zytrec  . Tracheostomy status ([place])   . Tubular adenoma of colon   . Tumors    "in my stomach"  . Umbilical hernia    watching , no plans for surgery at present  . VAP (ventilator-associated pneumonia) [facility])     Patient Active Problem List   Diagnosis Date Noted  . Pancreatitis 07/25/2017  . Mild intermittent asthma 06/22/2017  . Osteoarthritis of left hip 02/18/2017  . OSA on CPAP 09/11/2016  . Gastrostomy infection ([place]) 11/08/2015  . Esophageal dysmotility 11/08/2015  . Urine incontinence 10/19/2015  . Restrictive lung disease 10/11/2015  . Headache 09/14/2015  . Numbness and tingling 09/14/2015  . Headache, migraine   . HTN (hypertension), benign   . Dysphagia, neurologic   . Elevated LDH 05/06/2015  . Splenomegaly 04/07/2015  . Myasthenia gravis ([place])   . Osteoarthritis of left knee 12/02/2013  . Osteoarthritis of right knee 08/30/2013  . GERD (gastroesophageal reflux disease) 06/23/2013  . Morbid obesity ([place]) 06/23/2013  . Anxiety and depression 06/23/2013  . Fibroids 06/23/2013    Past Surgical History:  Procedure Laterality Date  . CESAREAN SECTION  1987; 1989  . COLONOSCOPY WITH PROPOFOL N/A 09/19/2013   Procedure: COLONOSCOPY WITH PROPOFOL;  Surgeon: Ladene Artist, MD;  Location: [facility];  Service: Endoscopy;  Laterality: N/A;  . DILATION AND CURETTAGE OF UTERUS    . ESOPHAGOGASTRODUODENOSCOPY N/A 08/10/2015   Procedure: ESOPHAGOGASTRODUODENOSCOPY (EGD);  Surgeon: Gatha Mayer, MD;  Location: [facility];  Service:  Endoscopy;  Laterality: N/A;  . ESOPHAGOGASTRODUODENOSCOPY (EGD) WITH PROPOFOL N/A 03/21/2015   Procedure: ESOPHAGOGASTRODUODENOSCOPY (EGD) WITH PROPOFOL;  Surgeon: Gatha Mayer, MD;  Location: [facility];  Service: Endoscopy;  Laterality: N/A;  . PARTIAL KNEE ARTHROPLASTY Right 08/30/2013   Procedure: RIGHT UNICOMPARTMENTAL KNEE;  Surgeon: Johnny Bridge, MD;  Location: [facility];  Service: Orthopedics;  Laterality:  Right;  . PARTIAL KNEE ARTHROPLASTY Left 12/02/2013   Procedure: LEFT KNEE UNI ARTHROPLASTY;  Surgeon: Johnny Bridge, MD;  Location: [place] [facility];  Service: Orthopedics;  Laterality: Left;  . PEG PLACEMENT N/A 08/10/2015   Procedure: PERCUTANEOUS ENDOSCOPIC GASTROSTOMY (PEG) PLACEMENT;  Surgeon: Gatha Mayer, MD;  Location: [facility];  Service: Endoscopy;  Laterality: N/A;  . TUBAL LIGATION  1989  . VAGINAL HYSTERECTOMY  1990's?   "apparently took out one of my ovaries at the time too cause one's missing"     OB History   None      Home Medications    Prior to Admission medications   Medication Sig Start Date End Date Taking? Authorizing Provider  albuterol (PROVENTIL) (2.5 MG/3ML) 0.083% nebulizer solution USE 1 VIAL IN NEBULIZER EVERY 6 HOURS AS NEEDED FOR SHORTNESS OF BREATH 06/24/17  Yes Leeanne Rio, MD  benzonatate (TESSALON) 100 MG capsule Take 1 capsule (100 mg total) by mouth 2 (two) times daily as needed for cough. 07/07/17  Yes Alveda Reasons, MD  cetirizine (ZYRTEC) 10 MG tablet Take 10 mg by mouth 2 (two) times daily as needed for itching. 04/29/17  Yes [provider]  chlorhexidine (PERIDEX) 0.12 % solution 15 mLs by Mouth Rinse route 2 (two) times daily. For 14 days 06/08/17  Yes [provider]  clonazePAM (KLONOPIN) 1 MG tablet TAKE 1 TABLET BY MOUTH TWICE A DAY AS NEEDED FOR ANXIETY 07/09/17  Yes Alveda Reasons, MD  diclofenac (VOLTAREN) 50 MG EC tablet Take 1 tablet (50 mg total) by mouth 2 (two) times daily as needed. 07/13/17  Yes [name], MD  guaiFENesin (ROBITUSSIN) 100 MG/5ML SOLN Take 15 mLs by mouth daily as needed for cough or to loosen phlegm.   Yes [provider]  ibuprofen (ADVIL,MOTRIN) 200 MG tablet Take 800 mg by mouth every 6 (six) hours as needed for fever or moderate pain.   Yes [provider]  Menthol (COUGH DROPS) 10 MG LOZG Take 1 lozenge by mouth daily as needed (cough).   Yes  [provider]  mycophenolate (CELLCEPT) 500 MG tablet Take 1,000 mg by mouth 2 (two) times daily. 07/24/17  Yes [provider]  nystatin cream (MYCOSTATIN) Apply 1 application topically 2 (two) times daily. 08/14/15  Yes Leeanne Rio, MD  predniSONE (DELTASONE) 10 MG tablet Take 10 mg by mouth daily with breakfast.   Yes [provider]  ranitidine (ZANTAC) 150 MG tablet Take 1 tablet (150 mg total) by mouth 2 (two) times daily. TAKE 1 TABLET BY MOUTH EVERYDAY AT BEDTIME 06/08/17  Yes Orson Eva J, DO  sertraline (ZOLOFT) 100 MG tablet TAKE 1 TABLET BY MOUTH EVERY DAY 06/18/17  Yes Leeanne Rio, MD  traMADol (ULTRAM) 50 MG tablet Take 0.5 tablets (25 mg total) by mouth every 12 (twelve) hours as needed for moderate pain or severe pain. 04/23/17  Yes McDiarmid, Blane Ohara, MD  fluticasone (FLONASE) 50 MCG/ACT nasal spray Place 2 sprays into both nostrils daily. Patient not taking: Reported on 07/25/2017 06/22/17   Tonette Bihari, MD  predniSONE (STERAPRED UNI-PAK 21 TAB) 10 MG (21) TBPK tablet Take by mouth daily. Take 6 tabs by mouth daily  for 2 days, then 5 tabs for 2 days, then 4 tabs for 2 days, then 3 tabs for 2 days, 2 tabs for 2 days, then 1 tab by mouth daily for 2 days Patient not taking: Reported on 07/25/2017 06/18/17   Joanne Gavel, PA-C    Family History Family History  Problem Relation Age of Onset  . Heart disease Mother 41  . Hypertension Mother   . Stroke Father   . Hypertension Father   . Other Sister        Esophageal strictures  . Anemia Sister   . COPD Brother   . Leukemia Sister   . Leukemia Sister   . Colon cancer Neg Hx     Social History Social History   Tobacco Use  . Smoking status: Former Smoker    Packs/day: 0.50    Years: 38.00    Pack years: 19.00    Types: Cigarettes    Start date: 03/03/1974    Last attempt to quit: 05/14/2012    Years since quitting: 5.2  . Smokeless tobacco: Never Used  . Tobacco  comment: denies thoughts of restarting  Substance Use Topics  . Alcohol use: No    Alcohol/week: 0.0 oz  . Drug use: No     Allergies   Ciprofloxacin; Shrimp [shellfish allergy]; Dicyclomine; Methocarbamol; and Penicillins   Review of Systems Review of Systems  Constitutional: Negative for chills, diaphoresis, fatigue and fever.  HENT: Negative for trouble swallowing.   Eyes: Negative for visual disturbance.  Respiratory: Negative for cough, choking, chest tightness, shortness of breath, wheezing and stridor.   Cardiovascular: Negative for chest pain, palpitations and leg swelling.  Gastrointestinal: Positive for abdominal pain and nausea. Negative for abdominal distention, anal bleeding, blood in stool, diarrhea and vomiting.       Upper quadrants and epigastric pain  Genitourinary: Negative for decreased urine volume, difficulty urinating, dysuria, flank pain, frequency, hematuria and pelvic pain.  Musculoskeletal: Negative for arthralgias, back pain, myalgias, neck pain and neck stiffness.  Skin: Negative for color change, pallor and rash.  Neurological: Negative for dizziness, seizures, syncope, weakness, light-headedness, numbness and headaches.     Physical Exam Updated Vital Signs BP (!) 142/82 (BP Location: Left Wrist)   Pulse 90   Temp 98.3 F (36.8 C) (Oral)   Resp 18   SpO2 93%   Physical Exam  Constitutional: She appears well-developed and well-nourished.  Non-toxic appearance. She does not appear ill. No distress.  Afebrile, nontoxic-appearing, lying in bed in no acute distress.  HENT:  Head: Normocephalic and atraumatic.  Eyes: Conjunctivae and EOM are normal. No scleral icterus.  Neck: Neck supple.  Cardiovascular: Normal rate, regular rhythm and normal heart sounds.  No murmur heard. Pulmonary/Chest: Effort normal and breath sounds normal. No respiratory distress.  Abdominal: Soft. Bowel sounds are normal. She exhibits no distension, no fluid wave and no  mass. There is tenderness in the right upper quadrant, epigastric area and left upper quadrant. There is positive Murphy's sign. There is no rigidity, no rebound, no guarding, no CVA tenderness and no tenderness at McBurney's point.  Musculoskeletal: She exhibits no edema.  Neurological: She is alert.  Skin: Skin is warm and dry. No rash noted. She is not diaphoretic. No cyanosis or erythema. No pallor.  Psychiatric: She has a normal mood and affect.  Nursing note and vitals  reviewed.    ED Treatments / Results  Labs (all labs ordered are listed, but only abnormal results are displayed) Labs Reviewed  LIPASE, BLOOD - Abnormal; Notable for the following components:      Result Value   Lipase 2,587 (*)    All other components within normal limits  COMPREHENSIVE METABOLIC PANEL - Abnormal; Notable for the following components:   Albumin 3.4 (*)    All other components within normal limits  CBC - Abnormal; Notable for the following components:   RBC 5.24 (*)    Hemoglobin 10.9 (*)    MCV 71.2 (*)    MCH 20.8 (*)    MCHC 29.2 (*)    RDW 19.6 (*)    All other components within normal limits  URINALYSIS, ROUTINE W REFLEX MICROSCOPIC - Abnormal; Notable for the following components:   Color, Urine COLORLESS (*)    Specific Gravity, Urine 1.004 (*)    All other components within normal limits  HEMOGLOBIN A1C - Abnormal; Notable for the following components:   Hgb A1c MFr Bld 6.0 (*)    All other components within normal limits  LIPID PANEL  HIV ANTIBODY (ROUTINE TESTING)  COMPREHENSIVE METABOLIC PANEL  CBC WITH DIFFERENTIAL/PLATELET  IGG 4  IRON AND TIBC  FERRITIN  I-STAT TROPONIN, ED   Hemoglobin  Date Value Ref Range Status  07/25/2017 10.9 (L) 12.0 - 15.0 g/dL Final  06/18/2017 11.1 (L) 12.0 - 15.0 g/dL Final    Comment:    REPEATED TO VERIFY  08/28/2016 10.8 (L) 11.1 - 15.9 g/dL Final  03/28/2016 12.4 12.0 - 15.0 g/dL Final  02/27/2016 11.2 (L) 12.0 - 15.0 g/dL Final    HGB  Date Value Ref Range Status  10/30/2015 12.4 11.6 - 15.9 g/dL Final  05/03/2015 11.3 (L) 11.6 - 15.9 g/dL Final    EKG EKG Interpretation  Date/Time:  Saturday Jul 25 2017 10:13:04 EDT Ventricular Rate:  88 PR Interval:  158 QRS Duration: 76 QT Interval:  370 QTC Calculation: 447 R Axis:   64 Text Interpretation:  Sinus rhythm with Premature atrial complexes Nonspecific ST abnormality Abnormal ECG No significant change since last tracing Confirmed by Dorie Rank [phone]) on 07/25/2017 4:09:45 PM   Radiology Dg Chest 2 View  Result Date: 07/25/2017 CLINICAL DATA:  Patient complains of epigastric pain since 5/10. States no pain when she woke but pain started today after breakfast. Pt denies any SOB but does state she is having some tightness in the center of her chest. EXAM: CHEST - 2 VIEW COMPARISON:  [id number] FINDINGS: Low lung volumes. Linear scarring or subsegmental atelectasis at the lung bases stable. No new airspace infiltrate. Heart size and mediastinal contours are within normal limits. No effusion. Visualized bones unremarkable. IMPRESSION: Low lung volumes with stable bibasilar changes.  No acute disease. Electronically Signed   By: Lucrezia Europe M.D.   On: 07/25/2017 10:38   US Abdomen Limited  Result Date: 07/25/2017 CLINICAL DATA:  Upper abdominal pain EXAM: ULTRASOUND ABDOMEN LIMITED RIGHT UPPER QUADRANT COMPARISON:  CT abdomen and pelvis November 23, 2015 FINDINGS: Gallbladder: Within the gallbladder, there are multiple echogenic foci which move and shadow consistent with cholelithiasis. Largest individual gallstone measures 7 mm in length. No gallbladder wall thickening or pericholecystic fluid. No sonographic Murphy sign noted by sonographer. Common bile duct: Diameter: 5 mm. No intrahepatic or extrahepatic biliary duct dilatation. Liver: No focal lesion identified. Within normal limits in parenchymal echogenicity. Portal vein is patent on color Doppler imaging  with normal  direction of blood flow towards the liver. IMPRESSION: Cholelithiasis.  Study otherwise within normal limits. Electronically Signed   By: Lowella Grip III M.D.   On: 07/25/2017 15:12    Procedures Procedures (including critical care time)  Medications Ordered in ED Medications  enoxaparin (LOVENOX) injection 40 mg (40 mg Subcutaneous Given 07/25/17 1828)  acetaminophen (TYLENOL) tablet 650 mg (has no administration in time range)    Or  acetaminophen (TYLENOL) suppository 650 mg (has no administration in time range)  polyethylene glycol (MIRALAX / GLYCOLAX) packet 17 g (has no administration in time range)  ondansetron (ZOFRAN) tablet 4 mg (has no administration in time range)    Or  ondansetron (ZOFRAN) injection 4 mg (has no administration in time range)  0.9 %  sodium chloride infusion ( Intravenous New Bag/Given 07/25/17 1828)  morphine 2 MG/ML injection 2 mg (has no administration in time range)  albuterol (PROVENTIL) (2.5 MG/3ML) 0.083% nebulizer solution 2.5 mg (has no administration in time range)  loratadine (CLARITIN) tablet 10 mg (has no administration in time range)  sertraline (ZOLOFT) tablet 100 mg (has no administration in time range)  mycophenolate (CELLCEPT) tablet 1,000 mg (has no administration in time range)  benzonatate (TESSALON) capsule 100 mg (has no administration in time range)  guaiFENesin (ROBITUSSIN) 100 MG/5ML solution 300 mg (has no administration in time range)  clonazePAM (KLONOPIN) tablet 1 mg (has no administration in time range)  famotidine (PEPCID) IVPB 20 mg premix (has no administration in time range)  predniSONE (DELTASONE) tablet 10 mg (has no administration in time range)  sodium chloride 0.9 % bolus 1,000 mL (0 mLs Intravenous Stopped 07/25/17 1526)  ondansetron (ZOFRAN) injection 4 mg (4 mg Intravenous Given 07/25/17 1220)  morphine 4 MG/ML injection 4 mg (4 mg Intravenous Given 07/25/17 1220)  sodium chloride 0.9 % bolus 1,000 mL (1,000 mLs  Intravenous New Bag/Given 07/25/17 1533)  HYDROmorphone (DILAUDID) injection 1 mg (1 mg Intravenous Given 07/25/17 1530)  ondansetron (ZOFRAN) injection 4 mg (4 mg Intravenous Given 07/25/17 1645)     Initial Impression / Assessment and Plan / ED Course  I have reviewed the triage vital signs and the nursing notes.  Pertinent labs & imaging results that were available during my care of the patient were reviewed by me and considered in my medical decision making (see chart for details).   Patient with history of myasthenia gravis presenting with severe epigastric pain of sudden onset this morning after breakfast.  Reports a progressive onset of milder symptoms since 07/15/2017.  She reports associated nausea but no vomiting.  Patient is a non-smoker who quit smoking approximately 20 years ago and denies any alcohol use.  This is a first occurrence for her.  Ordered IV fluids and pain management and will reassess. Ordered ultrasound which showed cholelithiasis without obstruction or evidence of cholecystitis. Patient initially improved with analgesia and experienced pain recurrence after her ultrasound.  She was provided with further analgesia with improvement.  Patient will need to be admitted for pain management, IV fluids, pancreatic rest and observation. Patient of [facility].  Consulted Family medicine for admission. Spoke to Dr. Kris Mouton from [facility] who will be admitting patient. Final Clinical Impressions(s) / ED Diagnoses   Final diagnoses:  Acute pancreatitis, unspecified complication status, unspecified pancreatitis type    ED Discharge Orders    None       Dossie Der 07/25/17 2033    Dorie Rank, MD 07/27/17 [phone]

## 2017-07-25 NOTE — H&P (Addendum)
[facility] Admission History and Physical Service Pager: [phone]  Patient name: Frances Medina Medical record number: [id number] Date of birth: [birth year] Age: 57 y.o. Gender: female  Primary Care Provider: [name], MD Consultants: none Code Status: full  Chief Complaint: abdominal pain  Assessment and Plan: Frances Medina is a 57 y.o. female presenting with 10 day history of worsening abdominal pain. Findings consistent with pancreatitis.  Abdominal pain 2/2 pancreatitis  57 year old female who presents with progressively worsening epigastric abdominal pain over 10 days with acute worsening this morning after eating breakfast. Only associated symptoms appear to be headache and possibly constipation. Patient with markedly elevated lipase to 2587 which is consistent with diagnosis. Per chart review, lipase only 41 in 2018 and 36 in 2017 so no consistent with acute on chronic.  Patient is a never smoker and does not drink alcohol. RUQ ultrasound performed which showed gallstones inside the gallbladder and no signs of a common bile duct stone. Other etiologies to consider are hyperlipidemia, autoimmune, malignancy, drug induced and anatomical variant. Will evaluate each of these but aware that idiopathic etiology exists. Per review of medication list the medications with slight association with pancreatitis appear to be zoloft, cellcept, and prednisone. The data behind each of these agents as a cause for pancreatitis is very limited and do not display a strong link.  Autoimmune is certainly higher on differential than in other patients given her history of myasthenia gravis. - admit to inpatient, medsurg, Dr. Mingo Amber  - obtain CT A/P w contrast to evaluate anatomy and r/o malignancy  - NPO for now - IVF - NS at 125 mL/hr  - pain control with morphine 2mg  q4 hours prn  - tylenol suppository prn  - lipid panel - IgG4 antibody to eval for Autoimmune  pancreatitis type I  - lovenox for dvt ppx  - vital signs per floor routine  Myasthenia Gravis Recently resolved flare and took steroid course. Is no longer taking the steroids and is only taking home regimen of cellcept and daily prednisone. Was suffering from shortness of breath prior to getting steroid burst but has now resolved.   - Continue home cellcept 1g bid  - Continue prednisone 10 mg daily   Microcytic anemia Hgb 10.9 on admission. This appears to be close to her baseline of 11.1. MCV 71 on admission cbc. Suspect this could be due to iron deficiency or anemia of chronic disease. Will draw iron, ferritin, and TIBC to evaluate.  Morbid Obesity Suspect this is likely contributing somewhat to shortness of breath and OSA. Could benefit from weight loss plan set up by PCP after discharge. - unsure if using CPAP at home but can add if needed   GERD Zantac 150 mg bid as outpt. Currently NPO, will switch to pepcid 20 mg bid.     Asthma Well controlled. Almost never needs albuterol per her report.  - albuterol nebs q 6 hours prn - loratadine 10mg  daily  Depression  Anxiety Well controlled on current meds.  At home on zoloft and klonopin.  - Continue zoloft 100mg  daily - Continue klonopin 1mg  bid   FEN/GI: NPO  Prophylaxis: lovenox  Disposition: admit to medsurg, attending Dr. Mingo Amber  History of Present Illness:  Frances Medina is a 57 y.o. female presenting with acutely worsening abdominal pain. The abdominal pain started on 5/15 which she described as epigastric and radiating across her upper abdomen. The pain has been consistent in character  but she feels like the intensity has slowly been getting worse of the last 10 days. She states that she does not think the pain gets worse with eating. The pain became acutely worse after eating lunch on 5/25. She denies any nausea or vomiting.  She states that she has been short of breath which she attributes to a myasthenia gravis  flare. She recently completed a course of steroids for this flare and has generally been feeling much better from a respiratory standpoint. She has been having some constipation for the last 10 days but does not attribute this to her pain. She states this is a chronic problem. She also endorses a headache during this time period. She states that is feels "like a normal headache". Patient also complains of some increased urination and thirst during this time period.  Workup in the ED consisted of a cmp, lipase, troponin, cbc, ua, chest xray, abdominal ultrasound, and ekg. CMP all within normal limits, lipase markedly elevated at 2,587. CBC with hemoglobin of 10.9, mcv 71. Troponin 0.01. Albumin 3.4. UA within normal limits. EKG sinus rhythm. Chest xray with low lung volumes in the bases. Abdominal US shows cholelithiasis within the gallbladder, but no signs of common duct dilatation or findings to account for presumed pancreatitis. Received NS bolus x2L, and morphine injection 4mg  IV x2 in ed. Also received dilaudid 1mg  x1 in ed.  Review Of Systems: Per HPI with the following additions:   Review of Systems  Constitutional: Negative for chills, fever and weight loss.  HENT: Negative for congestion.        Hoarseness  Eyes: Negative for pain.  Respiratory: Positive for shortness of breath. Negative for cough.   Cardiovascular: Negative for chest pain, palpitations and leg swelling.  Gastrointestinal: Positive for abdominal pain and constipation. Negative for nausea and vomiting.  Genitourinary: Negative for dysuria.  Musculoskeletal: Negative for myalgias.  Neurological: Positive for headaches. Negative for dizziness.  Psychiatric/Behavioral: Negative for depression.    Patient Active Problem List   Diagnosis Date Noted  . Pancreatitis 07/25/2017  . Mild intermittent asthma 06/22/2017  . Osteoarthritis of left hip 02/18/2017  . OSA on CPAP 09/11/2016  . 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 Esophageal dysmotility 11/08/2015  . Urine incontinence 10/19/2015  . Restrictive lung disease 10/11/2015  . Headache 09/14/2015  . Numbness and tingling 09/14/2015  . Headache, migraine   . HTN (hypertension), benign   . Dysphagia, neurologic   . Elevated LDH 05/06/2015  . Splenomegaly 04/07/2015  . Myasthenia gravis ([place])   . Osteoarthritis of left knee 12/02/2013  . Osteoarthritis of right knee 08/30/2013  . GERD (gastroesophageal reflux disease) 06/23/2013  . Morbid obesity ([place]) 06/23/2013  . Anxiety and depression 06/23/2013  . Fibroids 06/23/2013    Past Medical History: Past Medical History:  Diagnosis Date  . Acute respiratory failure ([place])   . Acute respiratory failure with hypoxemia ([place])   . Aspiration into airway   . CAP (community acquired pneumonia) 08/05/2015  . Depression with anxiety   . Diverticulosis   . Dysphagia 03/2015    EGD, Dr Carlean Purl. mild antral gastritis, ? due to Ibuprofen.  no stricture but empirically maloney dilated esophagus.   . E. coli UTI 04/07/2015  . Encounter for routine gynecological examination 10/19/2015  . Endotracheally intubated   . Enteritis due to Clostridium difficile   . Fibroid uterus    size of a dime  . 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 GERD (gastroesophageal reflux disease)  hiatal hernia  . Hypertension    "went away when I stopped smoking"  . Knee injury   . Left hip pain 02/18/2017  . Migraine    "maybe couple times/month" (03/20/2015)  . Mild intermittent asthma 06/22/2017  . Myasthenia gravis ([place]) 2017  . Myasthenia gravis with acute exacerbation ([place]) 05/15/2015  . Osteoarthritis of left knee 12/02/2013  . Osteoarthritis of right knee 08/30/2013  . Protein-calorie malnutrition, severe ([place]) 08/07/2015  . Seasonal allergies    takes Zytrec  . Tracheostomy status ([place])   . Tubular adenoma of colon   . Tumors    "in my stomach"  . Umbilical hernia    watching , no plans for surgery at present  . VAP  (ventilator-associated pneumonia) [facility])     Past Surgical History: Past Surgical History:  Procedure Laterality Date  . CESAREAN SECTION  1987; 1989  . COLONOSCOPY WITH PROPOFOL N/A 09/19/2013   Procedure: COLONOSCOPY WITH PROPOFOL;  Surgeon: Ladene Artist, MD;  Location: [facility] ENDOSCOPY;  Service: Endoscopy;  Laterality: N/A;  . DILATION AND CURETTAGE OF UTERUS    . ESOPHAGOGASTRODUODENOSCOPY N/A 08/10/2015   Procedure: ESOPHAGOGASTRODUODENOSCOPY (EGD);  Surgeon: Gatha Mayer, MD;  Location: [facility] ENDOSCOPY;  Service: Endoscopy;  Laterality: N/A;  . ESOPHAGOGASTRODUODENOSCOPY (EGD) WITH PROPOFOL N/A 03/21/2015   Procedure: ESOPHAGOGASTRODUODENOSCOPY (EGD) WITH PROPOFOL;  Surgeon: Gatha Mayer, MD;  Location: [facility];  Service: Endoscopy;  Laterality: N/A;  . PARTIAL KNEE ARTHROPLASTY Right 08/30/2013   Procedure: RIGHT UNICOMPARTMENTAL KNEE;  Surgeon: Johnny Bridge, MD;  Location: [place] [facility];  Service: Orthopedics;  Laterality: Right;  . PARTIAL KNEE ARTHROPLASTY Left 12/02/2013   Procedure: LEFT KNEE UNI ARTHROPLASTY;  Surgeon: Johnny Bridge, MD;  Location: [facility];  Service: Orthopedics;  Laterality: Left;  . PEG PLACEMENT N/A 08/10/2015   Procedure: PERCUTANEOUS ENDOSCOPIC GASTROSTOMY (PEG) PLACEMENT;  Surgeon: Gatha Mayer, MD;  Location: [facility];  Service: Endoscopy;  Laterality: N/A;  . TUBAL LIGATION  1989  . VAGINAL HYSTERECTOMY  1990's?   "apparently took out one of my ovaries at the time too cause one's missing"    Social History: Social History   Tobacco Use  . Smoking status: Former Smoker    Packs/day: 0.50    Years: 38.00    Pack years: 19.00    Types: Cigarettes    Start date: 03/03/1974    Last attempt to quit: 05/14/2012    Years since quitting: 5.2  . Smokeless tobacco: Never Used  . Tobacco comment: denies thoughts of restarting  Substance Use Topics  . Alcohol use: No    Alcohol/week: 0.0 oz  . Drug use: No   Additional social  history:  Please also refer to relevant sections of EMR.  Family History: Family History  Problem Relation Age of Onset  . Heart disease Mother 14  . Hypertension Mother   . Stroke Father   . Hypertension Father   . Other Sister        Esophageal strictures  . Anemia Sister   . COPD Brother   . Leukemia Sister   . Leukemia Sister   . Colon cancer Neg Hx     Allergies and Medications: Allergies  Allergen Reactions  . Ciprofloxacin Shortness Of Breath and Rash  . Shrimp [Shellfish Allergy] Hives and Shortness Of Breath    ER required  . Dicyclomine Hives  . Methocarbamol Hives  . Penicillins Hives    Has patient had a PCN reaction causing  immediate rash, facial/tongue/throat swelling, SOB or lightheadedness with hypotension: Yes Has patient had a PCN reaction causing severe rash involving mucus membranes or skin necrosis: No Has patient had a PCN reaction that required hospitalization No Has patient had a PCN reaction occurring within the last 10 years: No If all of the above answers are "NO", then may proceed with Cephalosporin use.    No current facility-administered medications on file prior to encounter.    Current Outpatient Medications on File Prior to Encounter  Medication Sig Dispense Refill  . albuterol (PROVENTIL) (2.5 MG/3ML) 0.083% nebulizer solution USE 1 VIAL IN NEBULIZER EVERY 6 HOURS AS NEEDED FOR SHORTNESS OF BREATH 150 mL 0  . benzonatate (TESSALON) 100 MG capsule Take 1 capsule (100 mg total) by mouth 2 (two) times daily as needed for cough. 20 capsule 0  . cetirizine (ZYRTEC) 10 MG tablet Take 10 mg by mouth 2 (two) times daily as needed for itching.  12  . chlorhexidine (PERIDEX) 0.12 % solution 15 mLs by Mouth Rinse route 2 (two) times daily. For 14 days  0  . clonazePAM (KLONOPIN) 1 MG tablet TAKE 1 TABLET BY MOUTH TWICE A DAY AS NEEDED FOR ANXIETY 60 tablet 0  . diclofenac (VOLTAREN) 50 MG EC tablet Take 1 tablet (50 mg total) by mouth 2 (two) times  daily as needed. 90 tablet 0  . guaiFENesin (ROBITUSSIN) 100 MG/5ML SOLN Take 15 mLs by mouth daily as needed for cough or to loosen phlegm.    [name] ibuprofen (ADVIL,MOTRIN) 200 MG tablet Take 800 mg by mouth every 6 (six) hours as needed for fever or moderate pain.    . Menthol (COUGH DROPS) 10 MG LOZG Take 1 lozenge by mouth daily as needed (cough).    . mycophenolate (CELLCEPT) 500 MG tablet Take 1,000 mg by mouth 2 (two) times daily.    [name] nystatin cream (MYCOSTATIN) Apply 1 application topically 2 (two) times daily. 30 g 0  . predniSONE (DELTASONE) 10 MG tablet Take 10 mg by mouth daily with breakfast.    . ranitidine (ZANTAC) 150 MG tablet Take 1 tablet (150 mg total) by mouth 2 (two) times daily. TAKE 1 TABLET BY MOUTH EVERYDAY AT BEDTIME 60 tablet 2  . sertraline (ZOLOFT) 100 MG tablet TAKE 1 TABLET BY MOUTH EVERY DAY 90 tablet 0  . traMADol (ULTRAM) 50 MG tablet Take 0.5 tablets (25 mg total) by mouth every 12 (twelve) hours as needed for moderate pain or severe pain. 30 tablet 3  . fluticasone (FLONASE) 50 MCG/ACT nasal spray Place 2 sprays into both nostrils daily. (Patient not taking: Reported on 07/25/2017) 16 g 6  . predniSONE (STERAPRED UNI-PAK 21 TAB) 10 MG (21) TBPK tablet Take by mouth daily. Take 6 tabs by mouth daily  for 2 days, then 5 tabs for 2 days, then 4 tabs for 2 days, then 3 tabs for 2 days, 2 tabs for 2 days, then 1 tab by mouth daily for 2 days (Patient not taking: Reported on 07/25/2017) 42 tablet 0    Objective: BP (!) 142/82 (BP Location: Left Wrist)   Pulse 90   Temp 98.3 F (36.8 C) (Oral)   Resp 18   SpO2 93%    Exam: General: fatigued appearing, african [place] female, NAD  Eyes: eomi, perrla ENTM: no external signs of trauma in forehead, or ears. Posterior larynx without erythema. Neck: range of motion intact, supple  Cardiovascular: rrr, no m/r/g. Palpable pt/dp bilaterally Respiratory: lungs clear to ausculation,  no increased work of  breathing Gastrointestinal: soft, non-tender, non-distended. MSK: 5/5 strength all muscle groups BLE, BUE. Derm: warm and dry Neuro: ao x3. CN 2-12, no neuro deficits Psych: alert and appropriate  Labs and Imaging: CBC BMET  Recent Labs  Lab 07/25/17 1012  WBC 9.5  HGB 10.9*  HCT 37.3  PLT 353   Recent Labs  Lab 07/25/17 1012  NA 142  K 3.7  CL 105  CO2 28  BUN 12  CREATININE 0.75  GLUCOSE 93  CALCIUM 9.3     Guadalupe Dawn, MD [place] PGY-1   I have separately seen and evaluated the above patient with Dr. Kris Mouton and agree with his documentation.  I have included my edits in blue.    Lovenia Kim, MD PGY-2, [facility]

## 2017-07-25 NOTE — ED Notes (Signed)
Patient transported to Ultrasound 

## 2017-07-25 NOTE — ED Triage Notes (Signed)
Patient complains of epigastric pain since 5/10. States no pain when she woke but pain started today after breakfast. Alert and oriented and in no apparent distress at this time.

## 2017-07-25 NOTE — ED Notes (Signed)
Pt ambulated to bathroom 

## 2017-07-25 NOTE — Progress Notes (Signed)
Patient arrived to 6n28, A&Ox4, VSS, IV intact.  Patient rates 2/10 at this time.  Family at bedside.  Patient and family oriented to room and equipment.  Will continue to monitor.

## 2017-07-26 DIAGNOSIS — G7000 Myasthenia gravis without (acute) exacerbation: Secondary | ICD-10-CM

## 2017-07-26 LAB — COMPREHENSIVE METABOLIC PANEL
ALT: 164 U/L — ABNORMAL HIGH (ref 14–54)
AST: 185 U/L — ABNORMAL HIGH (ref 15–41)
Albumin: 3 g/dL — ABNORMAL LOW (ref 3.5–5.0)
Alkaline Phosphatase: 152 U/L — ABNORMAL HIGH (ref 38–126)
Anion gap: 11 (ref 5–15)
BUN: 9 mg/dL (ref 6–20)
CO2: 25 mmol/L (ref 22–32)
Calcium: 8.6 mg/dL — ABNORMAL LOW (ref 8.9–10.3)
Chloride: 107 mmol/L (ref 101–111)
Creatinine, Ser: 0.68 mg/dL (ref 0.44–1.00)
GFR calc Af Amer: >60 mL/min (ref >60)
GFR calc non Af Amer: >60 mL/min (ref >60)
Glucose, Bld: 87 mg/dL (ref 65–99)
Potassium: 3.7 mmol/L (ref 3.5–5.1)
Sodium: 143 mmol/L (ref 135–145)
Total Bilirubin: 0.8 mg/dL (ref 0.3–1.2)
Total Protein: 6 g/dL — ABNORMAL LOW (ref 6.5–8.1)

## 2017-07-26 LAB — CBC WITH DIFFERENTIAL/PLATELET
Abs Immature Granulocytes: 0.1 10*3/uL (ref 0.0–0.1)
Basophils Absolute: 0 10*3/uL (ref 0.0–0.1)
Basophils Relative: 0 %
Eosinophils Absolute: 0 10*3/uL (ref 0.0–0.7)
Eosinophils Relative: 0 %
HCT: 33.6 % — ABNORMAL LOW (ref 36.0–46.0)
Hemoglobin: 9.8 g/dL — ABNORMAL LOW (ref 12.0–15.0)
Immature Granulocytes: 1 %
Lymphocytes Relative: 18 %
Lymphs Abs: 1.9 10*3/uL (ref 0.7–4.0)
MCH: 20.9 pg — ABNORMAL LOW (ref 26.0–34.0)
MCHC: 29.2 g/dL — ABNORMAL LOW (ref 30.0–36.0)
MCV: 71.8 fL — ABNORMAL LOW (ref 78.0–100.0)
Monocytes Absolute: 0.5 10*3/uL (ref 0.1–1.0)
Monocytes Relative: 5 %
Neutro Abs: 7.9 10*3/uL — ABNORMAL HIGH (ref 1.7–7.7)
Neutrophils Relative %: 76 %
Platelets: 313 10*3/uL (ref 150–400)
RBC: 4.68 MIL/uL (ref 3.87–5.11)
RDW: 19.3 % — ABNORMAL HIGH (ref 11.5–15.5)
WBC: 10.4 10*3/uL (ref 4.0–10.5)

## 2017-07-26 LAB — HIV ANTIBODY (ROUTINE TESTING W REFLEX): HIV Screen 4th Generation wRfx: NONREACTIVE

## 2017-07-26 LAB — AMYLASE: Amylase: 563 U/L — ABNORMAL HIGH (ref 28–100)

## 2017-07-26 MED ORDER — MYCOPHENOLATE MOFETIL 250 MG PO CAPS
1000.0000 mg | ORAL_CAPSULE | Freq: Two times a day (BID) | ORAL | Status: DC
  Administered 2017-07-26 – 2017-07-28 (×5): 1000 mg via ORAL
  Filled 2017-07-26 (×6): qty 4

## 2017-07-26 NOTE — Progress Notes (Signed)
Family Medicine Teaching Service Daily Progress Note Intern Pager: [phone]  Patient name: Frances Medina Medical record number: [id number] Date of birth: [birth year] Age: 57 y.o. Gender: female  Primary Care Provider: [name], MD Consultants: none Code Status: full  Pt Overview and Major Events to Date:  5/25- admitted to [facility] w/ abdominal pain, elevated lipase  Assessment and Plan: Frances Medina is a 57 y.o. female with PMH of anxiety/depression, myasthenia gravis, HLD, HTN, obesity, GERD, morbid obesity, anemia presenting with 10 day history of worsening abdominal pain. Findings consistent with pancreatitis.  Abdominal pain- improving On admission markedly elevated lipase to 2587 making pancreatitis most likely diagnosis. Patient does not drink alcohol. RUQ ultrasound gallstones, no CBG dilatation or stone. CT abdomen/pelvis only showed gallstones, no pancreatic inflammation, no masses or other concerning findings. Triglycerides low at 44. Per review of medication list the medications with slight association with pancreatitis appear to be zoloft, cellcept, and prednisone. The data behind each of these agents as a cause for pancreatitis is very limited and do not display a strong link.  Autoimmune is certainly higher on differential than in other patients given her history of myasthenia gravis. CMP this morning demonstrated increasing LFTs which were normal on admission.  - continue NPO - IVF - NS at 125 mL/hr  - add amylase to AM labs - pain control with morphine 2mg  q4 hours prn - required once last night - tylenol suppository prn  - IgG4 antibody to eval for Autoimmune pancreatitis type I  - vital signs per floor  - repeat CMP, CBC tomorrow  Myasthenia Gravis- chronic, stable home regimen of cellcept and daily prednisone  - Continue home cellcept 1g bid  - Continue prednisone 10 mg daily   Microcytic anemia- chronic, stable Hgb baseline of 11.1, 10.4 this am.Iron  studies consistent with iron deficiency anemia -will start oral iron once taking PO  Morbid Obesity Suspect this is likely contributing somewhat to shortness of breath and OSA. Could benefit from weight loss plan set up by PCP after discharge. - unsure if using CPAP at home but can add if needed   GERD- chronic, stable Zantac 150 mg bid as outpt.  - pepcid 20 mg bid while NPO   Asthma- chronic, stable - albuterol nebs q 6 hours prn - loratadine 10mg  daily  Depression  Anxiety- chronic, stable At home on zoloft and klonopin.  - Continue zoloft 100mg  daily - Continue klonopin 1mg  bid  FEN/GI: NPO PPx: lovenox  Disposition: continued inpatient care  Subjective:  Reports feeling bloated today and has full feeling especially when she needs to urinate. Denies fevers, chills, dysuria. She is not hungry.   Objective: Temp:  [98.1 F (36.7 C)-98.6 F (37 C)] 98.6 F (37 C) (05/26 0539) Pulse Rate:  [84-100] 88 (05/26 0539) Resp:  [12-22] 18 (05/25 1749) BP: (105-155)/(70-91) 120/73 (05/26 0539) SpO2:  [90 %-100 %] 95 % (05/26 0539) Physical Exam: General: obese woman, laying in bed in NAD Cardiovascular: RRR, no MRG Respiratory: CTAB. Normal work of breathing.  Abdomen: distended. Soft. Tender to palpation of epigastric area with guarding. No rebound. +BS Extremities: no edema or cyanosis  Laboratory: Recent Labs  Lab 07/25/17 1012 07/26/17 0310  WBC 9.5 10.4  HGB 10.9* 9.8*  HCT 37.3 33.6*  PLT 353 313   Recent Labs  Lab 07/25/17 1012 07/26/17 0310  NA 142 143  K 3.7 3.7  CL 105 107  CO2 28 25  BUN 12 9  CREATININE 0.75 0.68  CALCIUM 9.3 8.6*  PROT 6.7 6.0*  BILITOT 0.5 0.8  ALKPHOS 121 152*  ALT 24 164*  AST 40 185*  GLUCOSE 93 87   Lipase     Component Value Date/Time   LIPASE 2,587 (H) 07/25/2017 1012     Imaging/Diagnostic Tests: Dg Chest 2 View  Result Date: 07/25/2017 CLINICAL DATA:  Patient complains of epigastric pain since 5/10.  States no pain when she woke but pain started today after breakfast. Pt denies any SOB but does state she is having some tightness in the center of her chest. EXAM: CHEST - 2 VIEW COMPARISON:  [id number] FINDINGS: Low lung volumes. Linear scarring or subsegmental atelectasis at the lung bases stable. No new airspace infiltrate. Heart size and mediastinal contours are within normal limits. No effusion. Visualized bones unremarkable. IMPRESSION: Low lung volumes with stable bibasilar changes.  No acute disease. Electronically Signed   By: Lucrezia Europe M.D.   On: 07/25/2017 10:38   Ct Abdomen Pelvis W Contrast  Result Date: 07/26/2017 CLINICAL DATA:  Pancreatitis, gallstones, upper abdominal pain EXAM: CT ABDOMEN AND PELVIS WITH CONTRAST TECHNIQUE: Multidetector CT imaging of the abdomen and pelvis was performed using the standard protocol following bolus administration of intravenous contrast. CONTRAST:  174mL OMNIPAQUE IOHEXOL 300 MG/ML  SOLN COMPARISON:  Ultrasound 07/25/2017, CT 04/03/2016, 11/23/2015 FINDINGS: Lower chest: Lung bases demonstrate partial atelectasis in the right lower lobe and left lung base. No pleural effusion. Heart size within normal limits Hepatobiliary: No focal hepatic abnormality or biliary dilatation. Small calcified stones in the gallbladder Pancreas: Unremarkable. No pancreatic ductal dilatation or surrounding inflammatory changes. Spleen: Normal in size without focal abnormality. Adrenals/Urinary Tract: Adrenal glands are within normal limits. No hydronephrosis. Stable 18 mm intermediate density exophytic lesion lower pole left kidney, likely benign given lack of growth. Bladder within normal limits Stomach/Bowel: Stomach is within normal limits. Appendix appears normal. No evidence of bowel wall thickening, distention, or inflammatory changes. Vascular/Lymphatic: No significant vascular findings are present. No enlarged abdominal or pelvic lymph nodes. Reproductive: Uterus and bilateral  adnexa are unremarkable. Other: Negative for free air or free fluid. Small fat in the umbilical region Musculoskeletal: No acute or significant osseous findings. IMPRESSION: 1. No CT evidence for acute intra-abdominal or pelvic abnormality 2. Gallstones Electronically Signed   By: Donavan Foil M.D.   On: 07/26/2017 01:08   US Abdomen Limited  Result Date: 07/25/2017 CLINICAL DATA:  Upper abdominal pain EXAM: ULTRASOUND ABDOMEN LIMITED RIGHT UPPER QUADRANT COMPARISON:  CT abdomen and pelvis November 23, 2015 FINDINGS: Gallbladder: Within the gallbladder, there are multiple echogenic foci which move and shadow consistent with cholelithiasis. Largest individual gallstone measures 7 mm in length. No gallbladder wall thickening or pericholecystic fluid. No sonographic Murphy sign noted by sonographer. Common bile duct: Diameter: 5 mm. No intrahepatic or extrahepatic biliary duct dilatation. Liver: No focal lesion identified. Within normal limits in parenchymal echogenicity. Portal vein is patent on color Doppler imaging with normal direction of blood flow towards the liver. IMPRESSION: Cholelithiasis.  Study otherwise within normal limits. Electronically Signed   By: Lowella Grip III M.D.   On: 07/25/2017 15:12     Steve Rattler, DO 07/26/2017, 10:00 AM PGY-2, [place] Intern pager: [phone], text pages welcome

## 2017-07-27 LAB — COMPREHENSIVE METABOLIC PANEL
ALT: 117 U/L — ABNORMAL HIGH (ref 14–54)
AST: 71 U/L — ABNORMAL HIGH (ref 15–41)
Albumin: 3.1 g/dL — ABNORMAL LOW (ref 3.5–5.0)
Alkaline Phosphatase: 138 U/L — ABNORMAL HIGH (ref 38–126)
Anion gap: 9 (ref 5–15)
BUN: <5 mg/dL — ABNORMAL LOW (ref 6–20)
CO2: 26 mmol/L (ref 22–32)
Calcium: 8.9 mg/dL (ref 8.9–10.3)
Chloride: 108 mmol/L (ref 101–111)
Creatinine, Ser: 0.62 mg/dL (ref 0.44–1.00)
GFR calc Af Amer: >60 mL/min (ref >60)
GFR calc non Af Amer: >60 mL/min (ref >60)
Glucose, Bld: 102 mg/dL — ABNORMAL HIGH (ref 65–99)
Potassium: 3.6 mmol/L (ref 3.5–5.1)
Sodium: 143 mmol/L (ref 135–145)
Total Bilirubin: 0.5 mg/dL (ref 0.3–1.2)
Total Protein: 6.2 g/dL — ABNORMAL LOW (ref 6.5–8.1)

## 2017-07-27 LAB — CBC
HCT: 33 % — ABNORMAL LOW (ref 36.0–46.0)
Hemoglobin: 9.7 g/dL — ABNORMAL LOW (ref 12.0–15.0)
MCH: 21.1 pg — ABNORMAL LOW (ref 26.0–34.0)
MCHC: 29.4 g/dL — ABNORMAL LOW (ref 30.0–36.0)
MCV: 71.9 fL — ABNORMAL LOW (ref 78.0–100.0)
Platelets: 295 10*3/uL (ref 150–400)
RBC: 4.59 MIL/uL (ref 3.87–5.11)
RDW: 19.2 % — ABNORMAL HIGH (ref 11.5–15.5)
WBC: 8.9 10*3/uL (ref 4.0–10.5)

## 2017-07-27 LAB — AMYLASE: Amylase: 103 U/L — ABNORMAL HIGH (ref 28–100)

## 2017-07-27 LAB — LIPASE, BLOOD: Lipase: 33 U/L (ref 11–51)

## 2017-07-27 LAB — IGG 4: IgG, Subclass 4: 40 mg/dL (ref 2–96)

## 2017-07-27 MED ORDER — KETOROLAC TROMETHAMINE 30 MG/ML IJ SOLN
30.0000 mg | Freq: Three times a day (TID) | INTRAMUSCULAR | Status: DC | PRN
  Administered 2017-07-27 (×2): 30 mg via INTRAVENOUS
  Filled 2017-07-27 (×3): qty 1

## 2017-07-27 NOTE — Progress Notes (Signed)
Family Medicine Teaching Service Daily Progress Note Intern Pager: [phone]  Patient name: Frances Medina Medical record number: [id number] Date of birth: 1960/09/04 Age: 57 y.o. Gender: female  Primary Care Provider: [name], MD Consultants: none Code Status: full  Pt Overview and Major Events to Date:  5/25- admitted to [facility] w/ abdominal pain, elevated lipase  Assessment and Plan: [name] is a 57 y.o. female with PMH of anxiety/depression, myasthenia gravis, HLD, HTN, obesity, GERD, morbid obesity, anemia presenting with 10 day history of worsening abdominal pain. Findings consistent with pancreatitis.  Abdominal pain- improving. Lipase and amylase trended down and patient with less abdominal pain however unable to tolerate clear diet - clear diet, ADAT - IVF - NS at 125 mL/hr  - switch morphine to toradol due to headache with morphine - tylenol prn  - IgG4 antibody to eval for Autoimmune pancreatitis type I pending - vital signs per floor  - follow CMP, CBC tomorrow  Myasthenia Gravis- chronic, stable home regimen of cellcept and daily prednisone  - Continue home cellcept 1g bid  - Continue prednisone 10 mg daily   Microcytic anemia- chronic, stable Hgb baseline of 11.1, 10.4 this am.Iron studies consistent with iron deficiency anemia -will start oral iron once taking PO  Morbid Obesity Suspect this is likely contributing somewhat to shortness of breath and OSA. Could benefit from weight loss plan set up by PCP after discharge. - unsure if using CPAP at home but can add if needed   GERD- chronic, stable Zantac 150 mg bid as outpt.  - pepcid 20 mg bid while NPO   Asthma- chronic, stable - albuterol nebs q 6 hours prn - loratadine 10mg  daily  Depression  Anxiety- chronic, stable At home on zoloft and klonopin.  - Continue zoloft 100mg  daily - Continue klonopin 1mg  bid  FEN/GI: clear diet, advance to full liquid PPx: lovenox  Disposition:  continued inpatient care  Subjective:  Reports she tried soup last night and had severe abdominal pain. Took morphine and developed intense headache. She reports she has passed gas, no BM. Has been ambulating. No other complaints.  Objective: Temp:  [97.9 F (36.6 C)-98.4 F (36.9 C)] 98.4 F (36.9 C) (05/27 0457) Pulse Rate:  [73-96] 96 (05/27 0457) Resp:  [17] 17 (05/26 2117) BP: (116-147)/(68-94) 147/94 (05/27 0457) SpO2:  [94 %-97 %] 97 % (05/27 0457) Physical Exam: General: obese woman, laying in bed in NAD Cardiovascular: RRR, no MRG Respiratory: CTAB. Normal work of breathing.  Abdomen: distended. Soft. Non-tender. +BS Extremities: no edema or cyanosis  Laboratory: Recent Labs  Lab 07/25/17 1012 07/26/17 0310 07/27/17 0456  WBC 9.5 10.4 8.9  HGB 10.9* 9.8* 9.7*  HCT 37.3 33.6* 33.0*  PLT 353 313 295   Recent Labs  Lab 07/25/17 1012 07/26/17 0310 07/27/17 0456  NA 142 143 143  K 3.7 3.7 3.6  CL 105 107 108  CO2 28 25 26   BUN 12 9 <5*  CREATININE 0.75 0.68 0.62  CALCIUM 9.3 8.6* 8.9  PROT 6.7 6.0* 6.2*  BILITOT 0.5 0.8 0.5  ALKPHOS 121 152* 138*  ALT 24 164* 117*  AST 40 185* 71*  GLUCOSE 93 87 102*   Lipase     Component Value Date/Time   LIPASE 33 07/27/2017 0456   Amylase    Component Value Date/Time   AMYLASE 103 (H) 07/27/2017 0456     Steve Rattler, DO 07/27/2017, 8:02 AM PGY-2, [facility] Intern  pager: [phone], text pages welcome

## 2017-07-28 DIAGNOSIS — D509 Iron deficiency anemia, unspecified: Secondary | ICD-10-CM

## 2017-07-28 DIAGNOSIS — R0602 Shortness of breath: Secondary | ICD-10-CM

## 2017-07-28 LAB — COMPREHENSIVE METABOLIC PANEL
ALT: 76 U/L — ABNORMAL HIGH (ref 14–54)
AST: 31 U/L (ref 15–41)
Albumin: 3 g/dL — ABNORMAL LOW (ref 3.5–5.0)
Alkaline Phosphatase: 119 U/L (ref 38–126)
Anion gap: 6 (ref 5–15)
BUN: <5 mg/dL — ABNORMAL LOW (ref 6–20)
CO2: 28 mmol/L (ref 22–32)
Calcium: 8.7 mg/dL — ABNORMAL LOW (ref 8.9–10.3)
Chloride: 110 mmol/L (ref 101–111)
Creatinine, Ser: 0.65 mg/dL (ref 0.44–1.00)
GFR calc Af Amer: >60 mL/min (ref >60)
GFR calc non Af Amer: >60 mL/min (ref >60)
Glucose, Bld: 95 mg/dL (ref 65–99)
Potassium: 3.4 mmol/L — ABNORMAL LOW (ref 3.5–5.1)
Sodium: 144 mmol/L (ref 135–145)
Total Bilirubin: 0.7 mg/dL (ref 0.3–1.2)
Total Protein: 5.9 g/dL — ABNORMAL LOW (ref 6.5–8.1)

## 2017-07-28 LAB — CBC
HCT: 32 % — ABNORMAL LOW (ref 36.0–46.0)
Hemoglobin: 9.3 g/dL — ABNORMAL LOW (ref 12.0–15.0)
MCH: 21.2 pg — ABNORMAL LOW (ref 26.0–34.0)
MCHC: 29.1 g/dL — ABNORMAL LOW (ref 30.0–36.0)
MCV: 73.1 fL — ABNORMAL LOW (ref 78.0–100.0)
Platelets: 267 10*3/uL (ref 150–400)
RBC: 4.38 MIL/uL (ref 3.87–5.11)
RDW: 19.3 % — ABNORMAL HIGH (ref 11.5–15.5)
WBC: 7.2 10*3/uL (ref 4.0–10.5)

## 2017-07-28 MED ORDER — FERROUS SULFATE 325 (65 FE) MG PO TABS: 325.0000 mg | ORAL_TABLET | Freq: Every day | ORAL | Status: DC

## 2017-07-28 MED ORDER — FERROUS SULFATE 325 (65 FE) MG PO TABS: 325.0000 mg | ORAL_TABLET | Freq: Every day | ORAL | 0 refills | Status: DC

## 2017-07-28 MED ORDER — FAMOTIDINE 20 MG PO TABS
20.0000 mg | ORAL_TABLET | Freq: Two times a day (BID) | ORAL | Status: DC
  Administered 2017-07-28: 20 mg via ORAL
  Filled 2017-07-28: qty 1

## 2017-07-28 NOTE — Discharge Instructions (Signed)
You were admitted for pancreatitis. This is inflammation of the pancreas. You were treated with pain medications, fluids, and nothing to eat. While we did an extensive workup we did not find the cause for the pancreatitis which happens about 25% of the time. Please keep your follow up appointment. Come back to the hospital if you start having severe abdominal pain again. Acute Pancreatitis Acute pancreatitis is a condition in which the pancreas suddenly gets irritated and swollen (has inflammation). The pancreas is a large gland behind the stomach. It makes enzymes that help to digest food. The pancreas also makes hormones that help to control your blood sugar. Acute pancreatitis happens when the enzymes attack the pancreas and damage it. Most attacks last a couple of days and can cause serious problems. Follow these instructions at home: Eating and drinking  Follow instructions from your doctor about diet. You may need to: ? Avoid alcohol. ? Limit how much fat is in your diet.  Eat small meals often. Avoid eating big meals.  Drink enough fluid to keep your pee (urine) clear or pale yellow.  Do not drink alcohol if it caused your condition. General instructions  Take over-the-counter and prescription medicines only as told by your doctor.  Do not use any tobacco products. These include cigarettes, chewing tobacco, and e-cigarettes. If you need help quitting, ask your doctor.  Get plenty of rest.  If directed, check your blood sugar at home as told by your doctor.  Keep all follow-up visits as told by your doctor. This is important. Contact a doctor if:  You do not get better as quickly as expected.  You have new symptoms.  Your symptoms get worse.  You have lasting pain or weakness.  You continue to feel sick to your stomach (nauseous).  You get better and then you have another pain attack.  You have a fever. Get help right away if:  You cannot eat or keep fluids  down.  Your pain becomes very bad.  Your skin or the white part of your eyes turns yellow (jaundice).  You throw up (vomit).  You feel dizzy or you pass out (faint).  Your blood sugar is high (over 300 mg/dL). This information is not intended to replace advice given to you by your health care provider. Make sure you discuss any questions you have with your health care provider. Document Released: 08/06/2007 Document Revised: 07/26/2015 Document Reviewed: 11/21/2014 Elsevier Interactive Patient Education  2018 [employer].

## 2017-07-28 NOTE — Progress Notes (Signed)
PCP SOCIAL NOTE:   The patient was admitted for pancreatitis. Today, she denies any abdominal pain, and she is gradually advancing her diet. Patient stated she is ready to go home. As discussed with her, the team is uncertain the trigger for her pancreatitis; however, increase adipose fat or obesity can remotely predispose to pancreatitis. She is working on her diet, and she gets as much exercise as she can. I appreciate the care provided to my patient by the inpatient team. I will see her at follow-up upon discharge from the hospital.

## 2017-07-28 NOTE — Plan of Care (Signed)
  Problem: Education: Goal: Knowledge of General Education information will improve Outcome: Progressing   Problem: Health Behavior/Discharge Planning: Goal: Ability to manage health-related needs will improve Outcome: Progressing   Problem: Clinical Measurements: Goal: Ability to maintain clinical measurements within normal limits will improve Outcome: Progressing   Problem: Activity: Goal: Risk for activity intolerance will decrease Outcome: Progressing   Problem: Elimination: Goal: Will not experience complications related to bowel motility Outcome: Progressing

## 2017-07-28 NOTE — Discharge Summary (Signed)
[facility] Discharge Summary  Patient name: Frances Medina Medical record number: [id number] Date of birth: Oct 12, 1960 Age: 57 y.o. Gender: female Date of Admission: 07/25/2017  Date of Discharge: 07/28/2017 Admitting Physician: Alveda Reasons, MD  Primary Care Provider: [name], MD Consultants: none  Indication for Hospitalization: pancreatitis  Discharge Diagnoses/Problem List:  Abdominal pain  loose stools Myasthenia gravis Microcytic anemia Morbid obesity GERD Asthma depression  Disposition: home  Discharge Condition: stable  Discharge Exam: General: obese AA female resting comfortably in bed Cardiovascular: Regular Rate Rhythm, no Murmurs/Rubs/Gallops Respiratory: Clear to Ausculation Bilaterally. Normal work of breathing.  Abdomen: distended. Soft. Non-tender. +BS Extremities: no edema or cyanosis  Brief Hospital Course:  [name] who presented on 5/26 with severe epigastric abdominal pain. Lipase >2500 at admission.  Pancreatitis Patient made NPO, started on fluids, and pain control. Patient with no history of etoh abuse or smoking. RUQ U/S performed did show incidental gall stones, but no evidence of common bile duct stone. Patient had CT abdomen and pelvis w/ contrast which was unremarkable. It did not show inflammation of pancreas and the only findings was incidental gallstones. Recheck of lipase on 5/27 showed complete resolution and was down to 33. She was advanced to Clear Liquid Diet on 5/27 and did have some residual pain. She continued to improve and on 5/28 was feeling well enough for food. Advanced to regular diet in am of 5/28 which she tolerated with no issues. She was discharged in afternoon of 5/28, F/U with pcp scheduled on 5/31.  Iron Deficiency Anemia Noted to have mcv of 71 on admission and hgb of 10.9. Had iron panel drawn which was consistent with iron deficiency anemia. Patient did  have some drop in wbc, hgb, and platelets during admission. This was felt to be dilutional due to large amount of fluids. Patient was started on ferrous sulfate at discharge.  Myasthenia gravis Continued cellcept during admission. Tolerated well.  Issues for Follow Up:  1. Follow up resolution of abdominal pain 2. Consider repeat cbc to make sure likely dilutional decrease in hgb, plt, wbc resolves 3. Make sure patient able to get iron pills and is tolerating well  Significant Procedures:  Significant Labs and Imaging:  Recent Labs  Lab 07/26/17 0310 07/27/17 0456 07/28/17 0625  WBC 10.4 8.9 7.2  HGB 9.8* 9.7* 9.3*  HCT 33.6* 33.0* 32.0*  PLT 313 295 267   Recent Labs  Lab 07/25/17 1012 07/26/17 0310 07/27/17 0456 07/28/17 0625  NA 142 143 143 144  K 3.7 3.7 3.6 3.4*  CL 105 107 108 110  CO2 28 25 26 28   GLUCOSE 93 87 102* 95  BUN 12 9 <5* <5*  CREATININE 0.75 0.68 0.62 0.65  CALCIUM 9.3 8.6* 8.9 8.7*  ALKPHOS 121 152* 138* 119  AST 40 185* 71* 31  ALT 24 164* 117* 76*  ALBUMIN 3.4* 3.0* 3.1* 3.0*    Results/Tests Pending at Time of Discharge:   Discharge Medications:  Allergies as of 07/28/2017      Reactions   Ciprofloxacin Shortness Of Breath, Rash   Shrimp [shellfish Allergy] Hives, Shortness Of Breath   ER required   Dicyclomine Hives   Methocarbamol Hives   Penicillins Hives   Has patient had a PCN reaction causing immediate rash, facial/tongue/throat swelling, SOB or lightheadedness with hypotension: Yes Has patient had a PCN reaction causing severe rash involving mucus membranes or skin necrosis: No Has patient  had a PCN reaction that required hospitalization No Has patient had a PCN reaction occurring within the last 10 years: No If all of the above answers are "NO", then may proceed with Cephalosporin use.      Medication List    TAKE these medications   albuterol (2.5 MG/3ML) 0.083% nebulizer solution Commonly known as:  PROVENTIL USE 1 VIAL  IN NEBULIZER EVERY 6 HOURS AS NEEDED FOR SHORTNESS OF BREATH   benzonatate 100 MG capsule Commonly known as:  TESSALON Take 1 capsule (100 mg total) by mouth 2 (two) times daily as needed for cough.   cetirizine 10 MG tablet Commonly known as:  ZYRTEC Take 10 mg by mouth 2 (two) times daily as needed for itching.   chlorhexidine 0.12 % solution Commonly known as:  PERIDEX 15 mLs by Mouth Rinse route 2 (two) times daily. For 14 days   clonazePAM 1 MG tablet Commonly known as:  KLONOPIN TAKE 1 TABLET BY MOUTH TWICE A DAY AS NEEDED FOR ANXIETY   COUGH DROPS 10 MG Lozg Generic drug:  Menthol Take 1 lozenge by mouth daily as needed (cough).   diclofenac 50 MG EC tablet Commonly known as:  VOLTAREN Take 1 tablet (50 mg total) by mouth 2 (two) times daily as needed.   ferrous sulfate 325 (65 FE) MG tablet Take 1 tablet (325 mg total) by mouth daily with breakfast. Start taking on:  07/29/2017   fluticasone 50 MCG/ACT nasal spray Commonly known as:  FLONASE Place 2 sprays into both nostrils daily.   guaiFENesin 100 MG/5ML Soln Commonly known as:  ROBITUSSIN Take 15 mLs by mouth daily as needed for cough or to loosen phlegm.   ibuprofen 200 MG tablet Commonly known as:  ADVIL,MOTRIN Take 800 mg by mouth every 6 (six) hours as needed for fever or moderate pain.   mycophenolate 500 MG tablet Commonly known as:  CELLCEPT Take 1,000 mg by mouth 2 (two) times daily.   nystatin cream Commonly known as:  MYCOSTATIN Apply 1 application topically 2 (two) times daily.   predniSONE 10 MG tablet Commonly known as:  DELTASONE Take 10 mg by mouth daily with breakfast. What changed:  Another medication with the same name was removed. Continue taking this medication, and follow the directions you see here.   ranitidine 150 MG tablet Commonly known as:  ZANTAC Take 1 tablet (150 mg total) by mouth 2 (two) times daily. TAKE 1 TABLET BY MOUTH EVERYDAY AT BEDTIME   sertraline 100 MG  tablet Commonly known as:  ZOLOFT TAKE 1 TABLET BY MOUTH EVERY DAY   traMADol 50 MG tablet Commonly known as:  ULTRAM Take 0.5 tablets (25 mg total) by mouth every 12 (twelve) hours as needed for moderate pain or severe pain.       Discharge Instructions: Please refer to Patient Instructions section of EMR for full details.  Patient was counseled important signs and symptoms that should prompt return to medical care, changes in medications, dietary instructions, activity restrictions, and follow up appointments.   Follow-Up Appointments: Follow-up Information    [name], MD Follow up on 07/31/2017.   Specialty:  Family Medicine Why:  Please follow up with Dr. Gwendlyn Deutscher on 5/31 at 9:10am. Please arrive about 15 minutes early. Contact information: [place] [address] [phone]           Guadalupe Dawn, MD 07/28/2017, 2:36 PM PGY-1, [facility]

## 2017-07-28 NOTE — Progress Notes (Addendum)
  Family Medicine Teaching Service Daily Progress Note Intern Pager: [phone]  Patient name: Frances Medina Medical record number: [id number] Date of birth: 1960/12/16 Age: 57 y.o. Gender: female  Primary Care Provider: [name], MD Consultants: none Code Status: full  Pt Overview and Major Events to Date:  5/25- admitted to [facility] w/ abdominal pain, elevated lipase  Assessment and Plan: Frances Medina is a 57 y.o. female with PMH of anxiety/depression, myasthenia gravis, HLD, HTN, obesity, GERD, morbid obesity, anemia presenting with 10 day history of worsening abdominal pain. Found to have pancreatitis which has now resolved.  Abdominal pain  loose stools Lipase trended from >2500 to 33. Tolerated breakfast well this morning. If is able to tolerate lunch can D/C. There is no clear etiology of the pancreatitis. Ruled out many causes including etoh, smoking, gallstones, drug induced, lipid induced. IgG4 w/n/l which effectively rules out autoimmune pancreatitis type I. Cannot rule out type II but unlikely given clinical course. Did have loose stools after eating breakfast. Likely just gastric and intestinal secretions, expect to become more well formed later in day. - follow up toleration of lunch, likely dc this afternoon  Myasthenia Gravis home regimen of cellcept - Continue home cellcept 1g bid   Microcytic anemia Hgb baseline of 11.1, 10.4 this am.Iron studies consistent with iron deficiency anemia - starting iron sulfate 325mg  daily  Morbid Obesity Suspect this is likely contributing somewhat to shortness of breath and OSA. Could benefit from weight loss plan set up by PCP after discharge.  GERD- chronic, stable Zantac 150 mg bid as outpt.    Asthma- chronic, stable - albuterol nebs q 6 hours prn - loratadine 10mg  daily  Depression  Anxiety- chronic, stable At home on zoloft and klonopin.  - Continue zoloft 100mg  daily - Continue klonopin 1mg  bid  FEN/GI:  regular diet, advance to full liquid PPx: lovenox  Disposition: likely home  Subjective:  Tolerated breakfast with a few loose stools.  Objective: Temp:  [97.9 F (36.6 C)-98.1 F (36.7 C)] 98.1 F (36.7 C) (05/28 1132) Pulse Rate:  [67-81] 78 (05/28 1132) Resp:  [17-20] 18 (05/28 1132) BP: (114-146)/(54-78) 146/73 (05/28 1132) SpO2:  [94 %-100 %] 99 % (05/28 1132) Physical Exam: General: obese AA female resting comfortably in bed Cardiovascular: Regular Rate Rhythm, no Murmurs/Rubs/Gallops Respiratory: Clear to Ausculation Bilaterally. Normal work of breathing.  Abdomen: distended. Soft. Non-tender. +BS Extremities: no edema or cyanosis  Laboratory: Recent Labs  Lab 07/26/17 0310 07/27/17 0456 07/28/17 0625  WBC 10.4 8.9 7.2  HGB 9.8* 9.7* 9.3*  HCT 33.6* 33.0* 32.0*  PLT 313 295 267   Recent Labs  Lab 07/26/17 0310 07/27/17 0456 07/28/17 0625  NA 143 143 144  K 3.7 3.6 3.4*  CL 107 108 110  CO2 25 26 28   BUN 9 <5* <5*  CREATININE 0.68 0.62 0.65  CALCIUM 8.6* 8.9 8.7*  PROT 6.0* 6.2* 5.9*  BILITOT 0.8 0.5 0.7  ALKPHOS 152* 138* 119  ALT 164* 117* 76*  AST 185* 71* 31  GLUCOSE 87 102* 95   Lipase     Component Value Date/Time   LIPASE 33 07/27/2017 0456   Amylase    Component Value Date/Time   AMYLASE 103 (H) 07/27/2017 0456   Guadalupe Dawn, MD 07/28/2017, 12:49 PM PGY-1, [facility] Intern pager: [phone], text pages welcome

## 2017-07-31 ENCOUNTER — Other Ambulatory Visit: Payer: Self-pay | Admitting: Family Medicine

## 2017-07-31 ENCOUNTER — Encounter: Payer: Self-pay | Admitting: Family Medicine

## 2017-07-31 ENCOUNTER — Other Ambulatory Visit: Payer: Self-pay

## 2017-07-31 ENCOUNTER — Ambulatory Visit (INDEPENDENT_AMBULATORY_CARE_PROVIDER_SITE_OTHER): Payer: BC Managed Care – PPO | Admitting: Family Medicine

## 2017-07-31 VITALS — BP 124/88 | HR 77 | Temp 97.7°F | Wt 301.0 lb

## 2017-07-31 DIAGNOSIS — R05 Cough: Secondary | ICD-10-CM

## 2017-07-31 DIAGNOSIS — M545 Low back pain, unspecified: Secondary | ICD-10-CM

## 2017-07-31 DIAGNOSIS — D509 Iron deficiency anemia, unspecified: Secondary | ICD-10-CM

## 2017-07-31 DIAGNOSIS — M549 Dorsalgia, unspecified: Secondary | ICD-10-CM

## 2017-07-31 DIAGNOSIS — R059 Cough, unspecified: Secondary | ICD-10-CM

## 2017-07-31 DIAGNOSIS — R7303 Prediabetes: Secondary | ICD-10-CM

## 2017-07-31 DIAGNOSIS — Z1231 Encounter for screening mammogram for malignant neoplasm of breast: Secondary | ICD-10-CM | POA: Diagnosis not present

## 2017-07-31 DIAGNOSIS — D508 Other iron deficiency anemias: Secondary | ICD-10-CM | POA: Diagnosis not present

## 2017-07-31 DIAGNOSIS — K859 Acute pancreatitis without necrosis or infection, unspecified: Secondary | ICD-10-CM | POA: Diagnosis not present

## 2017-07-31 DIAGNOSIS — Z1239 Encounter for other screening for malignant neoplasm of breast: Secondary | ICD-10-CM

## 2017-07-31 HISTORY — DX: Dorsalgia, unspecified: M54.9

## 2017-07-31 LAB — POCT HEMOGLOBIN: Hemoglobin: 10.6 g/dL — AB (ref 12.2–16.2)

## 2017-07-31 MED ORDER — BACLOFEN 5 MG PO TABS: 5.0000 mg | ORAL_TABLET | Freq: Three times a day (TID) | ORAL | 0 refills | Status: AC | PRN

## 2017-07-31 MED ORDER — BENZONATATE 100 MG PO CAPS: 100.0000 mg | ORAL_CAPSULE | Freq: Two times a day (BID) | ORAL | 0 refills | Status: DC | PRN

## 2017-07-31 NOTE — Progress Notes (Signed)
Subjective:     Patient ID: Frances Medina, female   DOB: 28-Nov-1960, 57 y.o.   MRN: [id number]  HPI Pancreatitis:Here for follow-up after hospital discharge. This morning she woke up with lower belly pain feels like hunger pain.She is yet to have breakfast today because she is afraid. No other GI symptoms. Back pain:Lower back pain. 8/10 in severity. No radiation. Similar to the pain she had while she was in the hospital few days ago. No recent injury to her back. However, she felt that the hospital bed might have triggered her pain. No other concern. Anemia: Here for f/u. She started Iron pills,her first dose this morning. Obesity: She does as much exercise as she can tolerate and her diet is mostly healthy. She is interested in losing weight. Pre-DM: Here for follow-up. No concern.   Current Outpatient Medications on File Prior to Visit  Medication Sig Dispense Refill  . albuterol (PROVENTIL) (2.5 MG/3ML) 0.083% nebulizer solution USE 1 VIAL IN NEBULIZER EVERY 6 HOURS AS NEEDED FOR SHORTNESS OF BREATH 150 mL 0  . benzonatate (TESSALON) 100 MG capsule Take 1 capsule (100 mg total) by mouth 2 (two) times daily as needed for cough. 20 capsule 0  . cetirizine (ZYRTEC) 10 MG tablet Take 10 mg by mouth 2 (two) times daily as needed for itching.  12  . chlorhexidine (PERIDEX) 0.12 % solution 15 mLs by Mouth Rinse route 2 (two) times daily. For 14 days  0  . clonazePAM (KLONOPIN) 1 MG tablet TAKE 1 TABLET BY MOUTH TWICE A DAY AS NEEDED FOR ANXIETY 60 tablet 0  . diclofenac (VOLTAREN) 50 MG EC tablet Take 1 tablet (50 mg total) by mouth 2 (two) times daily as needed. 90 tablet 0  . ferrous sulfate 325 (65 FE) MG tablet Take 1 tablet (325 mg total) by mouth daily with breakfast. 30 tablet 0  . fluticasone (FLONASE) 50 MCG/ACT nasal spray Place 2 sprays into both nostrils daily. (Patient not taking: Reported on 07/25/2017) 16 g 6  . guaiFENesin (ROBITUSSIN) 100 MG/5ML SOLN Take 15 mLs by mouth daily as  needed for cough or to loosen phlegm.    [name] ibuprofen (ADVIL,MOTRIN) 200 MG tablet Take 800 mg by mouth every 6 (six) hours as needed for fever or moderate pain.    . Menthol (COUGH DROPS) 10 MG LOZG Take 1 lozenge by mouth daily as needed (cough).    . mycophenolate (CELLCEPT) 500 MG tablet Take 1,000 mg by mouth 2 (two) times daily.    [name] nystatin cream (MYCOSTATIN) Apply 1 application topically 2 (two) times daily. 30 g 0  . predniSONE (DELTASONE) 10 MG tablet Take 10 mg by mouth daily with breakfast.    . ranitidine (ZANTAC) 150 MG tablet Take 1 tablet (150 mg total) by mouth 2 (two) times daily. TAKE 1 TABLET BY MOUTH EVERYDAY AT BEDTIME 60 tablet 2  . sertraline (ZOLOFT) 100 MG tablet TAKE 1 TABLET BY MOUTH EVERY DAY 90 tablet 0  . traMADol (ULTRAM) 50 MG tablet Take 0.5 tablets (25 mg total) by mouth every 12 (twelve) hours as needed for moderate pain or severe pain. 30 tablet 3   No current facility-administered medications on file prior to visit.    Past Medical History:  Diagnosis Date  . Acute respiratory failure ([place])   . Acute respiratory failure with hypoxemia ([place])   . Aspiration into airway   . CAP (community acquired pneumonia) 08/05/2015  . Depression with anxiety   . Diverticulosis   .  Dysphagia 03/2015    EGD, Dr Carlean Purl. mild antral gastritis, ? due to Ibuprofen.  no stricture but empirically maloney dilated esophagus.   . E. coli UTI 04/07/2015  . Encounter for routine gynecological examination 10/19/2015  . Endotracheally intubated   . Enteritis due to Clostridium difficile   . Fibroid uterus    size of a dime  . Gastrostomy infection ([facility]) 11/08/2015  . Gastrostomy infection ([place]) 11/08/2015  . GERD (gastroesophageal reflux disease)    hiatal hernia  . Hypertension    "went away when I stopped smoking"  . Knee injury   . Left hip pain 02/18/2017  . Migraine    "maybe couple times/month" (03/20/2015)  . Mild intermittent asthma 06/22/2017  . Myasthenia gravis ([place])  2017  . Myasthenia gravis with acute exacerbation ([place]) 05/15/2015  . Osteoarthritis of left knee 12/02/2013  . Osteoarthritis of right knee 08/30/2013  . Protein-calorie malnutrition, severe ([place]) 08/07/2015  . Seasonal allergies    takes Zytrec  . Tracheostomy status ([place])   . Tubular adenoma of colon   . Tumors    "in my stomach"  . Umbilical hernia    watching , no plans for surgery at present  . VAP (ventilator-associated pneumonia) ([place])    Vitals:   07/31/17 0902  BP: 124/88  Pulse: 77  Temp: 97.7 F (36.5 C)  TempSrc: Oral  SpO2: 96%  Weight: (!) 301 lb (136.5 kg)     Review of Systems  Respiratory: Negative.   Cardiovascular: Negative.   Gastrointestinal: Positive for abdominal pain. Negative for constipation and diarrhea.  Genitourinary: Negative.   All other systems reviewed and are negative.      Objective:   Physical Exam  Constitutional: She is oriented to person, place, and time. She appears well-developed. No distress.  Cardiovascular: Normal rate and regular rhythm.  No murmur heard. Pulmonary/Chest: Effort normal and breath sounds normal. No stridor. No respiratory distress.  Abdominal: Soft. Normal appearance and bowel sounds are normal. There is tenderness in the right upper quadrant. There is no rigidity and no CVA tenderness.  Musculoskeletal: She exhibits no edema.       Lumbar back: She exhibits tenderness and spasm. She exhibits normal range of motion and no deformity.  Neurological: She is alert and oriented to person, place, and time.  Nursing note and vitals reviewed.      Assessment:     Pancreatitis: Back pain Anemia: Obesity: Pre-DM:    Plan:     Check problem list.   NB: I discussed + ANA done by a different provider with her. Titer low. Might recheck in the future.

## 2017-07-31 NOTE — Assessment & Plan Note (Signed)
Weight loss counseling done. Referred to a nutritionist. Instruction given for her to call for an appointment.

## 2017-07-31 NOTE — Assessment & Plan Note (Signed)
Hospital follow up. Abdominal pain started this morning. ?? Reoccurrence. Lipase checked today. I will contact her with result. She is advised to go to the ED if symptoms worsen. Tylenol as needed for pain.

## 2017-07-31 NOTE — Assessment & Plan Note (Signed)
Diet and exercise counseling done. Referred to nutritionist. She will benefit from weight loss surgery. Will discuss at next visit.

## 2017-07-31 NOTE — Assessment & Plan Note (Signed)
Likely MSK pain. Low back muscle tensed some with exam. Use Tylenol as needed. Baclofen prescribed prn spasm.

## 2017-07-31 NOTE — Assessment & Plan Note (Signed)
POC Hemoglobin looks good. Continue iron supplement. Recheck in 4 weeks.

## 2017-07-31 NOTE — Patient Instructions (Signed)

## 2017-08-01 ENCOUNTER — Telehealth: Payer: Self-pay | Admitting: Family Medicine

## 2017-08-01 LAB — LIPASE: Lipase: 41 U/L (ref 14–72)

## 2017-08-01 NOTE — Telephone Encounter (Signed)
HIPPA compliant message left for patient.    Note: Lipase level is normal from yesterday.

## 2017-08-06 ENCOUNTER — Ambulatory Visit
Admission: RE | Admit: 2017-08-06 | Discharge: 2017-08-06 | Disposition: A | Discharge status: Home / Self Care | Payer: BC Managed Care – PPO | Source: Ambulatory Visit | Attending: Family Medicine | Admitting: Family Medicine

## 2017-08-06 ENCOUNTER — Telehealth: Payer: Self-pay

## 2017-08-06 DIAGNOSIS — Z1231 Encounter for screening mammogram for malignant neoplasm of breast: Secondary | ICD-10-CM

## 2017-08-06 NOTE — Telephone Encounter (Signed)
LVM informing patient of normal mammogram results.

## 2017-08-06 NOTE — Telephone Encounter (Signed)
-----   Message from [name], MD sent at 08/06/2017  2:25 PM EDT ----- Please call to let patient know that her Mammogram result is normal. Repeat in 1 year.

## 2017-08-21 ENCOUNTER — Other Ambulatory Visit: Payer: Self-pay | Admitting: Family Medicine

## 2017-08-26 ENCOUNTER — Other Ambulatory Visit: Payer: Self-pay | Admitting: Family Medicine

## 2017-08-28 ENCOUNTER — Other Ambulatory Visit: Payer: Self-pay | Admitting: Family Medicine

## 2017-08-30 ENCOUNTER — Other Ambulatory Visit: Payer: Self-pay | Admitting: Family Medicine

## 2017-08-30 DIAGNOSIS — K219 Gastro-esophageal reflux disease without esophagitis: Secondary | ICD-10-CM

## 2017-08-31 ENCOUNTER — Other Ambulatory Visit: Payer: Self-pay | Admitting: Family Medicine

## 2017-08-31 DIAGNOSIS — R059 Cough, unspecified: Secondary | ICD-10-CM

## 2017-08-31 DIAGNOSIS — R05 Cough: Secondary | ICD-10-CM

## 2017-09-02 ENCOUNTER — Other Ambulatory Visit: Payer: Self-pay | Admitting: Family Medicine

## 2017-09-02 MED ORDER — CETIRIZINE HCL 10 MG PO TABS: 10.0000 mg | ORAL_TABLET | Freq: Every day | ORAL | 2 refills | Status: DC

## 2017-09-09 ENCOUNTER — Other Ambulatory Visit: Payer: Self-pay | Admitting: Family Medicine

## 2017-09-28 ENCOUNTER — Other Ambulatory Visit: Payer: Self-pay | Admitting: Family Medicine

## 2017-09-28 DIAGNOSIS — R05 Cough: Secondary | ICD-10-CM

## 2017-09-28 DIAGNOSIS — R059 Cough, unspecified: Secondary | ICD-10-CM

## 2017-10-01 ENCOUNTER — Encounter: Payer: Self-pay | Admitting: Nurse Practitioner

## 2017-10-01 ENCOUNTER — Other Ambulatory Visit (INDEPENDENT_AMBULATORY_CARE_PROVIDER_SITE_OTHER): Payer: BC Managed Care – PPO

## 2017-10-01 ENCOUNTER — Ambulatory Visit (INDEPENDENT_AMBULATORY_CARE_PROVIDER_SITE_OTHER): Payer: BC Managed Care – PPO | Admitting: Nurse Practitioner

## 2017-10-01 VITALS — BP 104/60 | HR 88 | Ht 60.0 in | Wt 312.5 lb

## 2017-10-01 DIAGNOSIS — R945 Abnormal results of liver function studies: Secondary | ICD-10-CM | POA: Diagnosis not present

## 2017-10-01 DIAGNOSIS — Z8719 Personal history of other diseases of the digestive system: Secondary | ICD-10-CM

## 2017-10-01 DIAGNOSIS — R1010 Upper abdominal pain, unspecified: Secondary | ICD-10-CM | POA: Diagnosis not present

## 2017-10-01 DIAGNOSIS — K8020 Calculus of gallbladder without cholecystitis without obstruction: Secondary | ICD-10-CM

## 2017-10-01 DIAGNOSIS — D509 Iron deficiency anemia, unspecified: Secondary | ICD-10-CM

## 2017-10-01 DIAGNOSIS — R6881 Early satiety: Secondary | ICD-10-CM

## 2017-10-01 DIAGNOSIS — R7989 Other specified abnormal findings of blood chemistry: Secondary | ICD-10-CM

## 2017-10-01 LAB — CBC
HCT: 36.3 % (ref 36.0–46.0)
Hemoglobin: 11.4 g/dL — ABNORMAL LOW (ref 12.0–15.0)
MCHC: 31.3 g/dL (ref 30.0–36.0)
MCV: 69.9 fl — ABNORMAL LOW (ref 78.0–100.0)
Platelets: 309 10*3/uL (ref 150.0–400.0)
RBC: 5.2 Mil/uL — ABNORMAL HIGH (ref 3.87–5.11)
RDW: 19.4 % — ABNORMAL HIGH (ref 11.5–15.5)
WBC: 8.5 10*3/uL (ref 4.0–10.5)

## 2017-10-01 LAB — COMPREHENSIVE METABOLIC PANEL
ALT: 11 U/L (ref 0–35)
AST: 14 U/L (ref 0–37)
Albumin: 4.2 g/dL (ref 3.5–5.2)
Alkaline Phosphatase: 100 U/L (ref 39–117)
BUN: 10 mg/dL (ref 6–23)
CO2: 29 mEq/L (ref 19–32)
Calcium: 9.4 mg/dL (ref 8.4–10.5)
Chloride: 105 mEq/L (ref 96–112)
Creatinine, Ser: 0.74 mg/dL (ref 0.40–1.20)
GFR: 104.26 mL/min (ref >60.00)
Glucose, Bld: 86 mg/dL (ref 70–99)
Potassium: 4 mEq/L (ref 3.5–5.1)
Sodium: 142 mEq/L (ref 135–145)
Total Bilirubin: 0.3 mg/dL (ref 0.2–1.2)
Total Protein: 7.4 g/dL (ref 6.0–8.3)

## 2017-10-01 LAB — LIPASE: Lipase: 34 U/L (ref 11.0–59.0)

## 2017-10-01 MED ORDER — OMEPRAZOLE 20 MG PO CPDR: 20.0000 mg | DELAYED_RELEASE_CAPSULE | ORAL | 2 refills | Status: DC

## 2017-10-01 NOTE — Patient Instructions (Addendum)
If you are age 56 or older, your body mass index should be between 23-30. Your Body mass index is 61.03 kg/m. If this is out of the aforementioned range listed, please consider follow up with your Primary Care Provider.  If you are age 53 or younger, your body mass index should be between 19-25. Your Body mass index is 61.03 kg/m. If this is out of the aformentioned range listed, please consider follow up with your Primary Care Provider.   Your provider has requested that you go to the basement level for lab work before leaving today. Press "B" on the elevator. The lab is located at the first door on the left as you exit the elevator.  We have sent the following medications to your pharmacy for you to pick up at your convenience: Omeprazole  STOP Diclofenac and Ibuprofen.  Follow up with me in 3-4 weeks.   Your appointment is on October 22, 2017 at 2:30 pm.  Thank you for choosing me and [facility].   Tye Savoy, NP

## 2017-10-01 NOTE — Progress Notes (Addendum)
P Primary GI:   Lucio Edward, MD   Chief Complaint:   Feels full in abdomen  IMPRESSION and PLAN:    #1. 57 yo female with complaints of early satiety, upper abdominal discomfort without weight loss. The upper abdominal discomfort is mainly related to the fullness, especially after eating. She has been taking NSAIDs several times a week and also on prednisone so consider gastritis / PUD. [name] Additionally, she was admitted late May for unexplained acute pancreatitis though current symptoms feel different -CBC, lipase CMET.  -stop ALL NSAIDS for now -Start Omeprazole 20 mg q am -Will call her with results and to get condition update in few days. If not improving, consider repeat CT scan and / or EGD. 'm hesitant to arrange for EGD until see is seen at [facility] for SOB. Patient has myasthenia gravis which often affects her breathing and lately breathing has been worse. She follows up at [facility] on 8/13.    #2. History of acute (biochemical) pancreatitis late May, etiology undetermined. Transaminases were elevated, cholelithiasis on CT scan but no evidence for choledocholithiasis.  -check liver chemistries today  #3.  Microcytic anemia in setting of NSAIDS. Hgb 10-11 in May, down from baseline around 12 but admitted at the time with pancreatitis, received large amount of IVF so could have been dilutional . Though MCV low, ferritin ~ 45 so not clearly iron deficient. She stopped iron two weeks ago due to constipation.  No overt GI bleeding.  -Will recheck CBC today.  -depending on clinical course may need EGD at some point. Not due for surveillance colonoscopy until next July but consider performing early if anemic   #3. GERD, asymptomatic on zantac at bedtime.   #4. Hx of colon polyps (screening colonoscopy by Dr. Fuller Plan in 2015), surveillance colonoscopy due July 2020      HPI:     Patient is a 57 year old female with a history of myasthenia gravis with respiratory involvement,  morbid obesity,  asthma, GERD, and iron deficiency anemia.  We saw her last in January 2018 and that was for evaluation of postprandial upper abdominal pain.  Subsequent CT scan was unremarkable.  Patient was hospitalized for 4 days late May with pancreatitis with significantly elevated lipase, elevated transaminases with AST of 185 / ALT of 164.   CT scan remarkable for cholelithiasis, no pancreatitis or biliary duct dilation.  Her abdominal pain resolved within a few days and she was tolerating solid food.    Frances Medina is here with complaints of early satiety, upper abdominal discomfort. Feels so full after eating that it makes her SOB sometimes. Symptoms don't feel like pancreatitis. No weight loss, she has gained weight. She has been taking ibuprofen and Diclofenac. No N/V. No black stools. She was started on iron during May hospitalization when Hgb was 10-11, Ferritin was 45, MCV 71. She stopped the iron two weeks ago due to constipation.    Review of systems:     No chest pain, no SOB, no fevers, no urinary sx   Past Medical History:  Diagnosis Date  . Acute respiratory failure ([place])   . Acute respiratory failure with hypoxemia ([place])   . Aspiration into airway   . CAP (community acquired pneumonia) 08/05/2015  . Depression with anxiety   . Diverticulosis   . Dysphagia 03/2015    EGD, Dr Carlean Purl. mild antral gastritis, ? due to Ibuprofen.  no stricture but empirically maloney dilated esophagus.   . E. coli UTI 04/07/2015  .  Encounter for routine gynecological examination 10/19/2015  . Endotracheally intubated   . Enteritis due to Clostridium difficile   . Fibroid uterus    size of a dime  . Gastrostomy infection ([place]) 11/08/2015  . Gastrostomy infection ([place]) 11/08/2015  . GERD (gastroesophageal reflux disease)    hiatal hernia  . Hypertension    "went away when I stopped smoking"  . Knee injury   . Left hip pain 02/18/2017  . Migraine    "maybe couple times/month" (03/20/2015)  . Mild intermittent asthma  06/22/2017  . Myasthenia gravis ([place]) 2017  . Myasthenia gravis with acute exacerbation ([place]) 05/15/2015  . Osteoarthritis of left knee 12/02/2013  . Osteoarthritis of right knee 08/30/2013  . Protein-calorie malnutrition, severe ([place]) 08/07/2015  . Seasonal allergies    takes Zytrec  . Tracheostomy status ([place])   . Tubular adenoma of colon   . Tumors    "in my stomach"  . Umbilical hernia    watching , no plans for surgery at present  . VAP (ventilator-associated pneumonia) ([place])     Patient's surgical history, family medical history, social history, medications and allergies were all reviewed in Epic   Creatinine clearance cannot be calculated (Patient's most recent lab result is older than the maximum 21 days allowed.)  Current Outpatient Medications  Medication Sig Dispense Refill  . albuterol (PROVENTIL) (2.5 MG/3ML) 0.083% nebulizer solution USE 1 VIAL IN NEBULIZER EVERY 6 HOURS AS NEEDED FOR SHORTNESS OF BREATH 150 mL 0  . Baclofen 5 MG TABS Take 1 tablet by mouth 3 (three) times daily.    . benzonatate (TESSALON) 100 MG capsule TAKE ONE CAPSULE BY MOUTH TWICE A DAY AS NEEDED FOR COUGH 20 capsule 0  . cetirizine (ZYRTEC) 10 MG tablet Take 1 tablet (10 mg total) by mouth daily. 90 tablet 2  . clonazePAM (KLONOPIN) 1 MG tablet TAKE 1 TABLET BY MOUTH TWICE A DAY AS NEEDED FOR ANXIETY 60 tablet 1  . diclofenac (VOLTAREN) 50 MG EC tablet TAKE 1 TABLET BY MOUTH TWICE DAILY AS NEEDED 90 tablet 1  . ferrous sulfate 325 (65 FE) MG tablet TAKE 1 TABLET BY MOUTH EVERY DAY WITH BREAKFAST 90 tablet 3  . fluticasone (FLONASE) 50 MCG/ACT nasal spray Place 2 sprays into both nostrils daily. 16 g 6  . guaiFENesin (ROBITUSSIN) 100 MG/5ML SOLN Take 15 mLs by mouth daily as needed for cough or to loosen phlegm.    [name] ibuprofen (ADVIL,MOTRIN) 200 MG tablet Take 800 mg by mouth every 6 (six) hours as needed for fever or moderate pain.    . Menthol (COUGH DROPS) 10 MG LOZG Take 1 lozenge by mouth daily as  needed (cough).    . mycophenolate (CELLCEPT) 500 MG tablet Take 1,000 mg by mouth 2 (two) times daily.    [name] nystatin cream (MYCOSTATIN) Apply 1 application topically 2 (two) times daily. 30 g 0  . predniSONE (DELTASONE) 10 MG tablet Take 10 mg by mouth daily with breakfast.    . ranitidine (ZANTAC) 150 MG tablet Take 1 tablet (150 mg total) by mouth daily. 90 tablet 1  . sertraline (ZOLOFT) 100 MG tablet TAKE 1 TABLET BY MOUTH EVERY DAY 90 tablet 3  . traMADol (ULTRAM) 50 MG tablet Take 0.5 tablets (25 mg total) by mouth every 12 (twelve) hours as needed for moderate pain or severe pain. 30 tablet 3   No current facility-administered medications for this visit.     Physical Exam:     Ht  5' (1.524 m)   Wt (!) 312 lb 8 oz (141.7 kg)   BMI 61.03 kg/m   GENERAL:  Pleasant female in NAD PSYCH: : Cooperative, normal affect EENT:  conjunctiva pink, mucous membranes moist, neck supple without masses CARDIAC:  RRR,  no peripheral edema PULM: Normal respiratory effort, lungs CTA bilaterally, no wheezing ABDOMEN:  Nondistended, soft, nontender. Normal bowel sounds SKIN:  turgor, no lesions seen Musculoskeletal:  Normal muscle tone, normal strength NEURO: Alert and oriented x 3, no focal neurologic deficits   Tye Savoy , NP 10/01/2017, 2:08 PM

## 2017-10-06 ENCOUNTER — Encounter: Payer: Self-pay | Admitting: Nurse Practitioner

## 2017-10-13 DIAGNOSIS — G7000 Myasthenia gravis without (acute) exacerbation: Secondary | ICD-10-CM | POA: Diagnosis not present

## 2017-10-14 NOTE — Progress Notes (Signed)
Reviewed and agree with initial management plan.  Shmiel Morton T. Davisha Linthicum, MD FACG 

## 2017-10-22 ENCOUNTER — Ambulatory Visit: Payer: Medicare Other | Admitting: Nurse Practitioner

## 2017-10-30 ENCOUNTER — Telehealth: Payer: Self-pay | Admitting: Family Medicine

## 2017-10-30 ENCOUNTER — Encounter: Payer: Self-pay | Admitting: Family Medicine

## 2017-10-30 ENCOUNTER — Other Ambulatory Visit: Payer: Self-pay | Admitting: Family Medicine

## 2017-10-30 DIAGNOSIS — R05 Cough: Secondary | ICD-10-CM

## 2017-10-30 DIAGNOSIS — R059 Cough, unspecified: Secondary | ICD-10-CM

## 2017-10-30 MED ORDER — BENZONATATE 100 MG PO CAPS: 100.0000 mg | ORAL_CAPSULE | Freq: Two times a day (BID) | ORAL | 1 refills | Status: DC | PRN

## 2017-10-30 NOTE — Telephone Encounter (Signed)
I called to check on patient. She stated that she is depressed about her weight gain. Denies suicidal or homicidal ideation. She sees a Ambulance person at [facility] and they referred her to weight loss class in [place] scheduled for Sept 18th.  She requested a referral to a bariatric surgeon here in [place]. Referral placed. I will not recommend weight loss pills at this time.

## 2017-11-10 ENCOUNTER — Encounter: Payer: Self-pay | Admitting: Nurse Practitioner

## 2017-11-10 ENCOUNTER — Other Ambulatory Visit (INDEPENDENT_AMBULATORY_CARE_PROVIDER_SITE_OTHER): Payer: BC Managed Care – PPO

## 2017-11-10 ENCOUNTER — Ambulatory Visit (INDEPENDENT_AMBULATORY_CARE_PROVIDER_SITE_OTHER): Payer: BC Managed Care – PPO | Admitting: Nurse Practitioner

## 2017-11-10 VITALS — BP 130/70 | HR 82 | Ht 60.0 in | Wt 310.0 lb

## 2017-11-10 DIAGNOSIS — R1010 Upper abdominal pain, unspecified: Secondary | ICD-10-CM

## 2017-11-10 DIAGNOSIS — D509 Iron deficiency anemia, unspecified: Secondary | ICD-10-CM

## 2017-11-10 LAB — FERRITIN: Ferritin: 15 ng/mL (ref 10.0–291.0)

## 2017-11-10 LAB — VITAMIN B12: Vitamin B-12: 352 pg/mL (ref 211–911)

## 2017-11-10 LAB — FOLATE: Folate: 8 ng/mL (ref >5.9)

## 2017-11-10 NOTE — Patient Instructions (Signed)
If you are age 56 or older, your body mass index should be between 23-30. Your Body mass index is 60.54 kg/m. If this is out of the aforementioned range listed, please consider follow up with your Primary Care Provider.  If you are age 80 or younger, your body mass index should be between 19-25. Your Body mass index is 60.54 kg/m. If this is out of the aformentioned range listed, please consider follow up with your Primary Care Provider.   Your provider has requested that you go to the basement level for lab work before leaving today. Press "B" on the elevator. The lab is located at the first door on the left as you exit the elevator. Anemia Panel  Continue Omeprazole every morning for three months, then call us. If better, may stop Omeprazole.  Thank you for choosing me and [facility].   Tye Savoy, NP

## 2017-11-10 NOTE — Progress Notes (Signed)
Reviewed and agree with initial management plan.  Kaoru Benda T. Dywane Peruski, MD FACG 

## 2017-11-10 NOTE — Progress Notes (Signed)
GI Provider:  Lucio Edward, MD  Chief Complaint:  Follow up on abdominal pain, anemia    IMPRESSION and PLAN:    #1. 57 yo female here for follow up on early satiety, upper abdominal discomfort -Feels so much better after reducing amount of Ibuprofen and starting Omeprazole. - I asked her to continue daily Omeprazole for a few months. She should call in 3-4 months, if still doing better will to get her off PPI.  -call us in interim for any recurrent abdominal pain   #2. History of acute (biochemical) pancreatitis late May with lipase of 2500, etiology undetermined. Transaminases were elevated, cholelithiasis on CT scan but no evidence for choledocholithiasis / pancreatic mass.  -repeat liver tests 10/01/17 were normal.   #3.  Chronic microcytic anemia, improving.  Her baseline hgb is ~ 12, down to 9-10 range in May while in hospital getting fluid resuscitation for pancreatitis. Repeat labs at last office visit 10/01/17 - hgb up to 11.4 but MCV still low at 69.  -Feritin ~ 45 (but that was drawn in hospital in setting of pancreatitis.).  No overt GI bleeding. I would lke to recheck anemia panel today. She has been off iron for a few weeks due to constipation.   #3. GERD, asymptomatic on zantac at bedtime.   #4. Hx of colon polyps (screening colonoscopy by Dr. Fuller Plan in 2015), surveillance colonoscopy due July 2020      HPI:     Patient is a 57 year old female with myasthenia gravis, obesity , and GERD who I saw a few weeks ago for evaluation of early satiety, upper abdominal pain without weight loss and microcytic anemia.  Obtained labs and serum chemistries, lipase were normal.  Her white count was normal.  Repeat hemoglobin had actually come up from being in the 9 range (while hospitalized with pancreatitis and getting aggressive IV fluids) to 11.4. MCV ~ 69. Started her on Prilosec, discontinued NSAIDs.She is here for follow-up.  Patient says she feels so much better.  She is  taking Zantac at bedtime for GERD, taking daily omeprazole.  She has significantly reduced the amount of ibuprofen intake.  No nausea or vomiting.  Bowels moving okay.  She is attending meetings for bariatric surgery  Review of systems:     No chest pain, no SOB, no fevers, no urinary sx   Past Medical History:  Diagnosis Date  . Acute respiratory failure with hypoxemia ([place])   . Aspiration into airway   . CAP (community acquired pneumonia) 08/05/2015  . Depression with anxiety   . Diverticulosis   . Dysphagia 03/2015    EGD, Dr Carlean Purl. mild antral gastritis, ? due to Ibuprofen.  no stricture but empirically maloney dilated esophagus.   . E. coli UTI 04/07/2015  . Encounter for routine gynecological examination 10/19/2015  . Endotracheally intubated   . Enteritis due to Clostridium difficile   . Fibroid uterus    size of a dime  . GERD (gastroesophageal reflux disease)    hiatal hernia  . Hypertension    "went away when I stopped smoking"  . Knee injury   . Migraine    "maybe couple times/month" (03/20/2015)  . Mild intermittent asthma 06/22/2017  . Myasthenia gravis ([place]) 2017  . Myasthenia gravis with acute exacerbation ([place]) 05/15/2015  . Osteoarthritis of left knee 12/02/2013  . Osteoarthritis of right knee 08/30/2013  . Protein-calorie malnutrition, severe ([place]) 08/07/2015  . Seasonal allergies    takes Zytrec  .  Tubular adenoma of colon   . Tumors    "in my stomach"  . Umbilical hernia    watching , no plans for surgery at present  . VAP (ventilator-associated pneumonia) ([place])     Patient's surgical history, family medical history, social history, medications and allergies were all reviewed in Epic   Creatinine clearance cannot be calculated (Patient's most recent lab result is older than the maximum 21 days allowed.)  Current Outpatient Medications  Medication Sig Dispense Refill  . albuterol (PROVENTIL) (2.5 MG/3ML) 0.083% nebulizer solution USE 1 VIAL IN NEBULIZER EVERY 6  HOURS AS NEEDED FOR SHORTNESS OF BREATH 150 mL 0  . Baclofen 5 MG TABS Take 1 tablet by mouth 3 (three) times daily.    . benzonatate (TESSALON) 100 MG capsule Take 1 capsule (100 mg total) by mouth 2 (two) times daily as needed for cough. 20 capsule 1  . cetirizine (ZYRTEC) 10 MG tablet Take 1 tablet (10 mg total) by mouth daily. 90 tablet 2  . clonazePAM (KLONOPIN) 1 MG tablet TAKE 1 TABLET BY MOUTH TWICE A DAY AS NEEDED FOR ANXIETY 60 tablet 1  . ferrous sulfate 325 (65 FE) MG tablet TAKE 1 TABLET BY MOUTH EVERY DAY WITH BREAKFAST 90 tablet 3  . fluticasone (FLONASE) 50 MCG/ACT nasal spray Place 2 sprays into both nostrils daily. 16 g 6  . guaiFENesin (ROBITUSSIN) 100 MG/5ML SOLN Take 15 mLs by mouth daily as needed for cough or to loosen phlegm.    . Menthol (COUGH DROPS) 10 MG LOZG Take 1 lozenge by mouth daily as needed (cough).    . mycophenolate (CELLCEPT) 500 MG tablet Take 1,000 mg by mouth 2 (two) times daily.    [name] nystatin cream (MYCOSTATIN) Apply 1 application topically 2 (two) times daily. 30 g 0  . omeprazole (PRILOSEC) 20 MG capsule Take 1 capsule (20 mg total) by mouth every morning. Take 30-60 minutes before breakfast. 30 capsule 2  . predniSONE (DELTASONE) 10 MG tablet Take 10 mg by mouth daily with breakfast.    . ranitidine (ZANTAC) 150 MG tablet Take 1 tablet (150 mg total) by mouth daily. 90 tablet 1  . sertraline (ZOLOFT) 100 MG tablet TAKE 1 TABLET BY MOUTH EVERY DAY 90 tablet 3  . traMADol (ULTRAM) 50 MG tablet Take 0.5 tablets (25 mg total) by mouth every 12 (twelve) hours as needed for moderate pain or severe pain. 30 tablet 3   No current facility-administered medications for this visit.     Physical Exam:     BP 130/70   Pulse 82   Ht 5' (1.524 m)   Wt (!) 310 lb (140.6 kg)   BMI 60.54 kg/m   GENERAL:  Pleasant female in NAD PSYCH: : Cooperative, normal affect EENT:  conjunctiva pink, mucous membranes moist, neck supple without masses CARDIAC:  RRR,  no  peripheral edema PULM: Normal respiratory effort, lungs CTA bilaterally, no wheezing ABDOMEN:  Nondistended, soft, nontender. No obvious masses, no hepatomegaly,  normal bowel sounds SKIN:  turgor, no lesions seen Musculoskeletal:  Normal muscle tone, normal strength NEURO: Alert and oriented x 3, no focal neurologic deficits   Tye Savoy , NP 11/10/2017, 9:13 AM

## 2017-11-10 NOTE — Addendum Note (Signed)
Addended by: Isaiah Serge D on: 11/10/2017 09:54 AM   Modules accepted: Orders

## 2017-11-11 LAB — IRON AND TIBC
Iron Saturation: 9 % — CL (ref 15–55)
Iron: 34 ug/dL (ref 27–159)
Total Iron Binding Capacity: 381 ug/dL (ref 250–450)
UIBC: 347 ug/dL (ref 131–425)

## 2017-11-23 ENCOUNTER — Other Ambulatory Visit: Payer: Self-pay

## 2017-11-23 DIAGNOSIS — R05 Cough: Secondary | ICD-10-CM

## 2017-11-23 DIAGNOSIS — R059 Cough, unspecified: Secondary | ICD-10-CM

## 2017-11-23 MED ORDER — FLUTICASONE PROPIONATE 50 MCG/ACT NA SUSP: 2.0000 | Freq: Every day | NASAL | 6 refills | Status: DC

## 2017-12-21 ENCOUNTER — Other Ambulatory Visit: Payer: Self-pay | Admitting: Nurse Practitioner

## 2017-12-21 ENCOUNTER — Other Ambulatory Visit: Payer: Self-pay | Admitting: Family Medicine

## 2018-01-01 ENCOUNTER — Other Ambulatory Visit: Payer: Self-pay | Admitting: Family Medicine

## 2018-01-01 DIAGNOSIS — R05 Cough: Secondary | ICD-10-CM

## 2018-01-01 DIAGNOSIS — R059 Cough, unspecified: Secondary | ICD-10-CM

## 2018-01-11 ENCOUNTER — Encounter: Payer: Self-pay | Admitting: Family Medicine

## 2018-01-11 ENCOUNTER — Ambulatory Visit
Admission: RE | Admit: 2018-01-11 | Discharge: 2018-01-11 | Disposition: A | Discharge status: Home / Self Care | Payer: BC Managed Care – PPO | Source: Ambulatory Visit | Attending: Family Medicine | Admitting: Family Medicine

## 2018-01-11 ENCOUNTER — Ambulatory Visit (INDEPENDENT_AMBULATORY_CARE_PROVIDER_SITE_OTHER): Payer: BC Managed Care – PPO | Admitting: Family Medicine

## 2018-01-11 ENCOUNTER — Ambulatory Visit: Payer: Medicare Other

## 2018-01-11 VITALS — BP 125/82 | HR 82 | Temp 98.6°F | Wt 313.0 lb

## 2018-01-11 DIAGNOSIS — R0600 Dyspnea, unspecified: Secondary | ICD-10-CM

## 2018-01-11 DIAGNOSIS — J019 Acute sinusitis, unspecified: Secondary | ICD-10-CM

## 2018-01-11 DIAGNOSIS — R0609 Other forms of dyspnea: Secondary | ICD-10-CM

## 2018-01-11 DIAGNOSIS — B9689 Other specified bacterial agents as the cause of diseases classified elsewhere: Secondary | ICD-10-CM | POA: Diagnosis not present

## 2018-01-11 MED ORDER — CEFDINIR 300 MG PO CAPS: 300.0000 mg | ORAL_CAPSULE | Freq: Two times a day (BID) | ORAL | 0 refills | Status: AC

## 2018-01-11 NOTE — Patient Instructions (Signed)
It was wonderful to see you today.  Thank you for choosing [facility].   Please call [phone] with any questions about today's appointment.  Please be sure to schedule follow up at the front  desk before you leave today.   Dorris Singh, MD  Family Medicine     Go to get your x-ray today  I will call you with blood work results tomorrow

## 2018-01-11 NOTE — Progress Notes (Signed)
  Patient Name: [name] Date of Birth: 04/01/1960 Date of Visit: 01/11/18 PCP: [name], MD  Chief Complaint: cough, congestion, pain with coughing   Subjective: JIMENA WIECZOREK is a pleasant 57 y.o. year old woman with medical history significant for myasthenia gravis on mycophenolate therapy, morbid obesity, obstructive sleep apnea, restrictive lung disease by pulmonary function testing (likely related to obesity), and anxiety presenting with cough and congestion.  The patient reports a one-week history of cough.  This worsened over the last week to include congestion, sinus pressure, and dyspnea upon exertion. Sunday Spillers reports cough that causes pain in the abdomen, low back, and lower chest. She denies chest pain with exertion, chest pain at rest, palpitations, nausea, or vomiting. She has tried albuterol with some relief. She also has as needed prednisone at home (prescibe several years ago). She tried 1-2 doses of this. No difficulty speaking, difficulty swallowing, or difficulty breathing. She is using her CPAP nightly.  ROS:  ROS  Negative as above.   I have reviewed the patient's medical, surgical, family, and social history as appropriate.   Vitals:   01/11/18 0933  BP: 125/82  Pulse: 82  Temp: 98.6 F (37 C)  SpO2: 97%   Filed Weights   01/11/18 0933  Weight: (!) 313 lb (142 kg)   HEENT: Sclera anicteric. Dentition is moderate. Appears well hydrated. TTP on right maxillary sinus.  Neck: Supple Cardiac: Regular rate and rhythm. Normal S1/S2. No murmurs, rubs, or gallops appreciated. Lungs: Clear bilaterally to ascultation. Good effort, no egophony, no wheezes, rales, rhonchi.  Abdomen: Normoactive bowel sounds. No tenderness to deep or light palpation. No rebound or guarding.  Extremities: Warm, well perfused without edema.  Skin: Warm, dry Psych: Pleasant and appropriate     Sunday Spillers is a pleasant 57 year old woman with a history of myasthenia gravis  presenting with a one-week history of cough with worsening dyspnea on exertion and pleuritic type chest pain with her coughing.  Differential includes pneumonia we will evaluate further as below.  Most likely given her normal pulse oximetry and her tenderness to palpation along her sinuses this may represent a sinusitis alone.  She is at risk for progression of her bacterial illness given her myasthenia gravis.  She has a history of prolonged hospital stay related to the diagnosis of pneumonia and myasthenia gravis.  Reviewed strict signs and symptoms to return to care.  I do not think this is a pulmonary embolism (no family history, no personal history, no recent travel, no estrogen-containing medications, no tachycardia on exam) or new onset heart failure or acute coronary syndrome.  Her symptoms are most consistent with a viral illness which progressed into a bacterial complication.  Will evaluate further as below.  I carefully reviewed the third-generation cephalosporin.  This should not worsen her myasthenia.  Additionally, she has tolerated the first generation cephalosporin in the past.  She has a history of hives to penicillin  Acute bacterial sinusitis  -     cefdinir (OMNICEF) 300 MG capsule; Take 1 capsule (300 mg total) by mouth 2 (two) times daily for 10 days.  Dyspnea on exertion -     DG Chest 2 View; Future -     Basic Metabolic Panel -     CBC with Differential    Dorris Singh, MD  Family Medicine Teaching Service

## 2018-01-12 ENCOUNTER — Telehealth: Payer: Self-pay | Admitting: Family Medicine

## 2018-01-12 LAB — BASIC METABOLIC PANEL
BUN/Creatinine Ratio: 16 (ref 9–23)
BUN: 11 mg/dL (ref 6–24)
CO2: 22 mmol/L (ref 20–29)
Calcium: 9.6 mg/dL (ref 8.7–10.2)
Chloride: 105 mmol/L (ref 96–106)
Creatinine, Ser: 0.68 mg/dL (ref 0.57–1.00)
GFR calc Af Amer: 113 mL/min/{1.73_m2} (ref >59)
GFR calc non Af Amer: 98 mL/min/{1.73_m2} (ref >59)
Glucose: 98 mg/dL (ref 65–99)
Potassium: 3.9 mmol/L (ref 3.5–5.2)
Sodium: 145 mmol/L — ABNORMAL HIGH (ref 134–144)

## 2018-01-12 LAB — CBC WITH DIFFERENTIAL/PLATELET
Basophils Absolute: 0 10*3/uL (ref 0.0–0.2)
Basos: 0 %
EOS (ABSOLUTE): 0 10*3/uL (ref 0.0–0.4)
Eos: 0 %
Hematocrit: 36.7 % (ref 34.0–46.6)
Hemoglobin: 10.9 g/dL — ABNORMAL LOW (ref 11.1–15.9)
Immature Grans (Abs): 0.1 10*3/uL (ref 0.0–0.1)
Immature Granulocytes: 1 %
Lymphocytes Absolute: 1.7 10*3/uL (ref 0.7–3.1)
Lymphs: 16 %
MCH: 21.2 pg — ABNORMAL LOW (ref 26.6–33.0)
MCHC: 29.7 g/dL — ABNORMAL LOW (ref 31.5–35.7)
MCV: 71 fL — ABNORMAL LOW (ref 79–97)
Monocytes Absolute: 0.4 10*3/uL (ref 0.1–0.9)
Monocytes: 4 %
Neutrophils Absolute: 8.4 10*3/uL — ABNORMAL HIGH (ref 1.4–7.0)
Neutrophils: 79 %
Platelets: 323 10*3/uL (ref 150–450)
RBC: 5.14 x10E6/uL (ref 3.77–5.28)
RDW: 17.2 % — ABNORMAL HIGH (ref 12.3–15.4)
WBC: 10.6 10*3/uL (ref 3.4–10.8)

## 2018-01-12 NOTE — Telephone Encounter (Signed)
Called patient with results. Anemia persists. She has had an EGD and CSY, due June 2020. She is taking ferrous sulfate once per day with PPI. Recommended spacing PPI and ferrous sulfate--- more than 2 hours apart (should take iron before PPI). Repeat CBC in 4-6 weeks. All questions answered.

## 2018-01-20 DIAGNOSIS — G4733 Obstructive sleep apnea (adult) (pediatric): Secondary | ICD-10-CM | POA: Diagnosis not present

## 2018-01-20 DIAGNOSIS — R5383 Other fatigue: Secondary | ICD-10-CM | POA: Diagnosis not present

## 2018-01-20 DIAGNOSIS — G7000 Myasthenia gravis without (acute) exacerbation: Secondary | ICD-10-CM | POA: Diagnosis not present

## 2018-01-20 DIAGNOSIS — J9503 Malfunction of tracheostomy stoma: Secondary | ICD-10-CM | POA: Diagnosis not present

## 2018-01-22 ENCOUNTER — Other Ambulatory Visit (HOSPITAL_COMMUNITY): Payer: Self-pay | Admitting: General Surgery

## 2018-01-27 ENCOUNTER — Other Ambulatory Visit: Payer: Self-pay | Admitting: Family Medicine

## 2018-01-27 DIAGNOSIS — R05 Cough: Secondary | ICD-10-CM

## 2018-01-27 DIAGNOSIS — R059 Cough, unspecified: Secondary | ICD-10-CM

## 2018-02-01 ENCOUNTER — Telehealth: Payer: Self-pay

## 2018-02-01 NOTE — Telephone Encounter (Signed)
SENT REFERRAL TO SCHEDULING AND FILED NOTES 

## 2018-02-04 ENCOUNTER — Ambulatory Visit (HOSPITAL_COMMUNITY)
Admission: RE | Admit: 2018-02-04 | Discharge: 2018-02-04 | Disposition: A | Discharge status: Home / Self Care | Payer: BC Managed Care – PPO | Source: Ambulatory Visit | Attending: General Surgery | Admitting: General Surgery

## 2018-02-04 ENCOUNTER — Telehealth: Payer: Self-pay

## 2018-02-04 NOTE — Telephone Encounter (Signed)
Sent 2 referral to scheduling and filed notes

## 2018-02-11 ENCOUNTER — Encounter: Payer: BC Managed Care – PPO | Attending: General Surgery | Admitting: Dietician

## 2018-02-11 VITALS — Ht 61.0 in | Wt 313.9 lb

## 2018-02-11 DIAGNOSIS — E669 Obesity, unspecified: Secondary | ICD-10-CM

## 2018-02-11 NOTE — Progress Notes (Signed)
Bariatric Pre-Op Nutrition Assessment for Planned Sleeve Gastrectomy Surgery Medical Nutrition Therapy  Appt Start Time: 9:15am  End time: 10:15am  Patient was seen on 02/11/2018 for Pre-Operative Nutrition Assessment. Assessment and letter of approval faxed to [facility] Bariatric Surgery Program coordinator on 02/11/2018.   Pt expectation of surgery:  To have more energy, feel physically better, and get her appetite back.   Pt expectation of dietitian: To provide education on better eating habits.  Anthropometrics  Start weight at [facility]: 313.9 lbs Height: 61 in BMI: 59.3 kg/m2    Clinical  Medical Hx: obesity, sleep apnea, protein-calorie malnutrition, myasthenia gravis, hypertension, GERD, dysphagia, diverticulosis, depression, ARF Surgeries: hysterectomy, PEG placement, bilateral partial knee arthroplasty Medications: benzonatate, cetirizine, mycophenolate, doxycycline, omeprazole, sertraline, clonazepam Allergies: ciprofloxacin, dicyclomine, methocarbamol, penicillin, shrimp  Psychosocial/Lifestyle Pt lives with her husband and daughters. Pt currently does not work as she is on disability. Pt states she knows people who have had bariatric surgery. Pt is talkative and friendly, and seems motivated for surgery.   24-Hr Dietary Recall  First Meal: coffee + cream & sugar Snack: none Second Meal: vegetables (corn/peas or salad veggies) Snack: none Third Meal: chicken + veggies Snack: none Beverages: soda, sometimes water  Food & Nutrition Related Hx Dietary Hx: Pt states she loves salad and vegetables. Pt states she typically only eats 1 or 2 meals per day. Pt states her medications suppress her appetite and make her very drowsy. Pt states this loss of appetite (dt medications and habitual patterns of skipping meals) leaves her with little to no energy. Pt states she drinks lots of coffee. Foods eaten include chicken, eggs, and beans. Pt states she does all the cooking  for her family at home.  Supplements: iron Estimated Daily Fluid Intake: not stated GI / Other Notable Symptoms: none stated   Physical Activity  Current average weekly physical activity: None. Pt states she is in a lot of physical pain, preventing her from being physically active.   Estimated Energy Needs Calories: 1600 Carbohydrate: 180g Protein: 120g Fat: 44g  Pre-Op Goals Reviewed with the Patient . Track food and beverage intake (try MyFitness Pal or the Baritastic app) . Make healthy food choices . Avoid concentrated sugars and fried foods . Keep fat & sugar in the single digits per serving on food labels . Practice CHEWING your food (aim for applesauce consistency) . Practice not drinking 15 minutes before, during, and 30 minutes after each meal and snack . Avoid all carbonated beverages (ex: soda, sparkling beverages)  . Limit caffeinated beverages (ex: coffee, tea, energy drinks) . Avoid all sugar-sweetened beverages (ex: regular soda, sports drinks)  . Avoid alcohol  . Consume 3 meals per day or try to eat every 3-5 hours . Make a list of non-food related activities . Aim for 64-100 ounces of FLUID daily (with at least half of fluid intake being plain water)  . Aim for at least 60-80 grams of PROTEIN daily . Look for a liquid protein source that contains ?15 g protein and ?5 g carbohydrate (ex: shakes, drinks, shots) . Physical activity is an important part of a healthy lifestyle so keep it moving! The goal is to reach 150 minutes of exercise per week, including cardiovascular and weight baring activity.  *Goals that are bolded indicate the pt would like to start working towards these  Handouts Provided Include  . Pre-Op Goals . Bariatric Surgery Vitamins & Minerals . Bariatric Surgery Protein Shakes  . Snack Ideas  Learning  Style & Readiness for Change Teaching method utilized: Visual & Auditory  Demonstrated degree of understanding via: Teach Back  Barriers to  learning/adherence to lifestyle change: Pain limits physical activity; medications interfere with appetite and energy levels  RD's Notes for Next Visit   Address progress made on pre-op goals, especially eating at least 3 times/day, as well as fluid intake and caffeine intake.    Next Steps Supervised Weight Loss (SWL) Visits Needed: 0  Patient is to return to [facility] for Pre-Op Class (>2 weeks before surgery) and call to enroll in Post-Op Class (2 weeks after surgery) for further nutrition education when surgery date is scheduled.

## 2018-02-11 NOTE — Patient Instructions (Signed)
   Work on [employer] discussed during today's visit.   Start taking multivitamin and vitamin D supplements.

## 2018-02-21 NOTE — Progress Notes (Signed)
Cardiology Office Note:    Date:  02/22/2018   ID:  Frances Medina, DOB Feb 05, 1961, MRN [id number]  PCP:  [name], MD  Cardiologist:  No primary care provider on file.  Electrophysiologist:  None   Referring MD: Mickeal Skinner, *   Bariatric surgery pre operative exam  History of Present Illness:    Frances Medina is a 57 y.o. female with a hx of medical history significant for myasthenia gravis on mycophenolate therapy, morbid obesity, obstructive sleep apnea, restrictive lung disease by pulmonary function testing likely secondary to obesity, and anxiety. She presents for preoperative evaluation prior to bariatric surgery.   Preoperative cardiovascular evaluation Pertinent past cardiac history: none Prior cardiac workup: Normal echo 2018 History of valve disease: none  History of CAD/PAD/CVA/TIA: none History of heart failure: none History of arrhythmia: none  On anticoagulation: no Additional history (hypertension, diabetes/on insulin, CKD/current creatinine, OSA, anesthesia complications): OSA Current symptoms: dyspnea on exertion Functional capacity: approximately 4 METS  Past Medical History:  Diagnosis Date  . Acute respiratory failure ([place])   . Acute respiratory failure with hypoxemia ([place])   . Aspiration into airway   . CAP (community acquired pneumonia) 08/05/2015  . Depression with anxiety   . Diverticulosis   . Dysphagia 03/2015    EGD, Dr Carlean Purl. mild antral gastritis, ? due to Ibuprofen.  no stricture but empirically maloney dilated esophagus.   . E. coli UTI 04/07/2015  . Encounter for routine gynecological examination 10/19/2015  . Endotracheally intubated   . Enteritis due to Clostridium difficile   . Fibroid uterus    size of a dime  . Gastrostomy infection ([place]) 11/08/2015  . Gastrostomy infection ([place]) 11/08/2015  . GERD (gastroesophageal reflux disease)    hiatal hernia  . Hypertension    "went away when I stopped smoking"  . Knee injury    . Left hip pain 02/18/2017  . Migraine    "maybe couple times/month" (03/20/2015)  . Mild intermittent asthma 06/22/2017  . Myasthenia gravis ([place]) 2017  . Myasthenia gravis with acute exacerbation ([place]) 05/15/2015  . Osteoarthritis of left knee 12/02/2013  . Osteoarthritis of right knee 08/30/2013  . Protein-calorie malnutrition, severe ([place]) 08/07/2015  . Seasonal allergies    takes Zytrec  . Tracheostomy status ([place])   . Tubular adenoma of colon   . Tumors    "in my stomach"  . Umbilical hernia    watching , no plans for surgery at present  . VAP (ventilator-associated pneumonia) [facility])     Past Surgical History:  Procedure Laterality Date  . CESAREAN SECTION  1987; 1989  . COLONOSCOPY WITH PROPOFOL N/A 09/19/2013   Procedure: COLONOSCOPY WITH PROPOFOL;  Surgeon: Ladene Artist, MD;  Location: [facility];  Service: Endoscopy;  Laterality: N/A;  . DILATION AND CURETTAGE OF UTERUS    . ESOPHAGOGASTRODUODENOSCOPY N/A 08/10/2015   Procedure: ESOPHAGOGASTRODUODENOSCOPY (EGD);  Surgeon: Gatha Mayer, MD;  Location: [facility] ENDOSCOPY;  Service: Endoscopy;  Laterality: N/A;  . ESOPHAGOGASTRODUODENOSCOPY (EGD) WITH PROPOFOL N/A 03/21/2015   Procedure: ESOPHAGOGASTRODUODENOSCOPY (EGD) WITH PROPOFOL;  Surgeon: Gatha Mayer, MD;  Location: [place];  Service: Endoscopy;  Laterality: N/A;  . PARTIAL KNEE ARTHROPLASTY Right 08/30/2013   Procedure: RIGHT UNICOMPARTMENTAL KNEE;  Surgeon: Johnny Bridge, MD;  Location: [place];  Service: Orthopedics;  Laterality: Right;  . PARTIAL KNEE ARTHROPLASTY Left 12/02/2013   Procedure: LEFT KNEE UNI ARTHROPLASTY;  Surgeon: Johnny Bridge, MD;  Location: [place];  Service: Orthopedics;  Laterality: Left;  . PEG PLACEMENT N/A 08/10/2015   Procedure: PERCUTANEOUS ENDOSCOPIC GASTROSTOMY (PEG) PLACEMENT;  Surgeon: Gatha Mayer, MD;  Location: [place];  Service: Endoscopy;  Laterality: N/A;  . TUBAL LIGATION  1989  . VAGINAL HYSTERECTOMY   1990's?   "apparently took out one of my ovaries at the time too cause one's missing"    Current Medications: No outpatient medications have been marked as taking for the 02/22/18 encounter (Office Visit) with Elouise Munroe, MD.     Allergies:   Ciprofloxacin; Shrimp [shellfish allergy]; Dicyclomine; Methocarbamol; Penicillins; and Morphine and related   Social History   Socioeconomic History  . Marital status: Married    Spouse name: Not on file  . Number of children: 3  . Years of education: Not on file  . Highest education level: Not on file  Occupational History  . Occupation: disabled  Social Needs  . Financial resource strain: Not on file  . Food insecurity:    Worry: Not on file    Inability: Not on file  . Transportation needs:    Medical: Not on file    Non-medical: Not on file  Tobacco Use  . Smoking status: Former Smoker    Packs/day: 0.50    Years: 38.00    Pack years: 19.00    Types: Cigarettes    Start date: 03/03/1974    Last attempt to quit: 05/14/2012    Years since quitting: 5.7  . Smokeless tobacco: Never Used  . Tobacco comment: denies thoughts of restarting  Substance and Sexual Activity  . Alcohol use: No    Alcohol/week: 0.0 standard drinks  . Drug use: No  . Sexual activity: Not Currently    Birth control/protection: Surgical  Lifestyle  . Physical activity:    Days per week: Not on file    Minutes per session: Not on file  . Stress: Not on file  Relationships  . Social connections:    Talks on phone: Not on file    Gets together: Not on file    Attends religious service: Not on file    Active member of club or organization: Not on file    Attends meetings of clubs or organizations: Not on file    Relationship status: Not on file  Other Topics Concern  . Not on file  Social History Narrative   Work or School: [employer] Situation: lives with husband and daughters      Spiritual Beliefs:  Christian      Lifestyle: no regular exercise; trying to eat healthy      [facility] (09/11/16):   Originally from [facility]. Has always lived in [place]. No pets currently. Does have carpet in her home in her bedroom. No bird exposure. Previously had mold under her kitchen sink. No indoor plants. Previously was working in [employer].            Family History: The patient's family history includes Anemia in her sister; COPD in her brother; Heart disease (age of onset: 41) in her mother; Hypertension in her father and mother; Leukemia in her sister and sister; Other in her sister; Stroke in her father. There is no history of Colon cancer.  ROS:   Please see the history of present illness.    All other systems reviewed and are negative.  EKGs/Labs/Other Studies Reviewed:    The following studies were reviewed today:  EKG:  NSR, ventricular rate 78 bpm  Recent Labs: 06/18/2017: B Natriuretic Peptide 21.5 10/01/2017: ALT 11 01/11/2018: BUN 11; Creatinine, Ser 0.68; Hemoglobin 10.9; Platelets 323; Potassium 3.9; Sodium 145  Recent Lipid Panel    Component Value Date/Time   CHOL 161 07/25/2017 1846   TRIG 44 07/25/2017 1846   HDL 60 07/25/2017 1846   CHOLHDL 2.7 07/25/2017 1846   VLDL 9 07/25/2017 1846   LDLCALC 92 07/25/2017 1846    Physical Exam:    VS:  BP 126/70   Pulse 78   Ht 5' (1.524 m)   Wt (!) 314 lb 12.8 oz (142.8 kg)   BMI 61.48 kg/m     Wt Readings from Last 3 Encounters:  02/22/18 (!) 314 lb 12.8 oz (142.8 kg)  02/11/18 (!) 313 lb 14.4 oz (142.4 kg)  01/11/18 (!) 313 lb (142 kg)     Constitutional: No acute distress Eyes: pupils equally round and reactive to light, sclera non-icteric, normal conjunctiva and lids ENMT: normal dentition, moist mucous membranes Cardiovascular: regular rhythm, normal rate, no murmurs. S1 and S2 normal. Radial pulses normal bilaterally. No jugular venous distention.  Respiratory: clear to auscultation bilaterally GI  : normal bowel sounds, soft and nontender. No distention. Obese abdomen MSK: extremities warm, well perfused. No edema.  NEURO: grossly nonfocal exam, moves all extremities. PSYCH: alert and oriented x 3, normal mood and affect.   ASSESSMENT:    1. Preop cardiovascular exam    PLAN:    Frances Medina denies exertional chest pain.  She has exertional shortness of breath likely secondary to deconditioning.  She had a normal echocardiogram last year.  I have also reviewed a CT pulmonary artery angiogram performed in April 2019 which does not reveal significant coronary artery calcifications.  She has infrequent palpitations that seem related to eating dark chocolate.  She has appropriately treated sleep apnea.  She does have a family history of coronary artery disease with her mother having an MI that led to her death at age 62.  She also has a smoking history of approximately 20 pack years.  With her family history and smoking history as well as her obesity, she is likely intermediate risk for general anesthesia.  No further cardiovascular testing is required today.  I understand that her gastric bypass surgery has not been scheduled yet, therefore I will see her back in 6 months or just prior to her surgery to ensure that she has not had any cardiovascular decompensation.  At that time we will comment on cardiovascular optimization.  Medication Adjustments/Labs and Tests Ordered: Current medicines are reviewed at length with the patient today.  Concerns regarding medicines are outlined above.  No orders of the defined types were placed in this encounter.  No orders of the defined types were placed in this encounter.   There are no Patient Instructions on file for this visit.   Signed, Elouise Munroe, MD  02/22/2018 9:11 AM    [place]

## 2018-02-22 ENCOUNTER — Encounter: Payer: Self-pay | Admitting: Internal Medicine

## 2018-02-22 ENCOUNTER — Ambulatory Visit (INDEPENDENT_AMBULATORY_CARE_PROVIDER_SITE_OTHER): Payer: BC Managed Care – PPO | Admitting: Internal Medicine

## 2018-02-22 VITALS — BP 126/70 | HR 78 | Ht 60.0 in | Wt 314.8 lb

## 2018-02-22 DIAGNOSIS — Z01810 Encounter for preprocedural cardiovascular examination: Secondary | ICD-10-CM | POA: Diagnosis not present

## 2018-02-22 NOTE — Patient Instructions (Signed)
Medication Instructions:  Your physician recommends that you continue on your current medications as directed. Please refer to the Current Medication list given to you today.  If you need a refill on your cardiac medications before your next appointment, please call your pharmacy.   Lab work: None ordered If you have labs (blood work) drawn today and your tests are completely normal, you will receive your results only by: . MyChart Message (if you have MyChart) OR . A paper copy in the mail If you have any lab test that is abnormal or we need to change your treatment, we will call you to review the results.  Testing/Procedures: None ordered  Follow-Up: At [facility], you and your health needs are our priority.  As part of our continuing mission to provide you with exceptional heart care, we have created designated Provider Care Teams.  These Care Teams include your primary Cardiologist (physician) and Advanced Practice Providers (APPs -  Physician Assistants and Nurse Practitioners) who all work together to provide you with the care you need, when you need it. You will need a follow up appointment in 6 months.  Please call our office 2 months in advance to schedule this appointment.  You may see Dr.Acharya or one of the following Advanced Practice Providers on your designated Care Team:   Rhonda Barrett, PA-C . Kathryn Lawrence, DNP, ANP  Any Other Special Instructions Will Be Listed Below (If Applicable).    

## 2018-02-25 ENCOUNTER — Encounter: Payer: Self-pay | Admitting: Family Medicine

## 2018-02-25 ENCOUNTER — Other Ambulatory Visit: Payer: Self-pay

## 2018-02-25 ENCOUNTER — Ambulatory Visit (INDEPENDENT_AMBULATORY_CARE_PROVIDER_SITE_OTHER): Payer: BC Managed Care – PPO | Admitting: Family Medicine

## 2018-02-25 ENCOUNTER — Other Ambulatory Visit: Payer: Self-pay | Admitting: Family Medicine

## 2018-02-25 VITALS — BP 124/75 | Temp 97.8°F | Ht 61.0 in | Wt 313.0 lb

## 2018-02-25 DIAGNOSIS — R7309 Other abnormal glucose: Secondary | ICD-10-CM | POA: Diagnosis not present

## 2018-02-25 DIAGNOSIS — R7303 Prediabetes: Secondary | ICD-10-CM

## 2018-02-25 DIAGNOSIS — J309 Allergic rhinitis, unspecified: Secondary | ICD-10-CM

## 2018-02-25 DIAGNOSIS — J329 Chronic sinusitis, unspecified: Secondary | ICD-10-CM | POA: Insufficient documentation

## 2018-02-25 DIAGNOSIS — D508 Other iron deficiency anemias: Secondary | ICD-10-CM | POA: Diagnosis not present

## 2018-02-25 DIAGNOSIS — Z23 Encounter for immunization: Secondary | ICD-10-CM

## 2018-02-25 DIAGNOSIS — G7000 Myasthenia gravis without (acute) exacerbation: Secondary | ICD-10-CM | POA: Diagnosis not present

## 2018-02-25 DIAGNOSIS — K219 Gastro-esophageal reflux disease without esophagitis: Secondary | ICD-10-CM | POA: Diagnosis not present

## 2018-02-25 HISTORY — DX: Chronic sinusitis, unspecified: J32.9

## 2018-02-25 LAB — POCT GLYCOSYLATED HEMOGLOBIN (HGB A1C): HbA1c, POC (controlled diabetic range): 5.9 % (ref 0.0–7.0)

## 2018-02-25 MED ORDER — MONTELUKAST SODIUM 10 MG PO TABS: 10.0000 mg | ORAL_TABLET | Freq: Every day | ORAL | 1 refills | Status: DC

## 2018-02-25 NOTE — Assessment & Plan Note (Signed)
I advised her regarding the recall status for Ranitidine. She prefer to continue this medication until her GI states otherwise. She will f/u with her GI on this.

## 2018-02-25 NOTE — Progress Notes (Signed)
Subjective:     Patient ID: Frances Medina, female   DOB: Jun 09, 1960, 57 y.o.   MRN: [id number]  HPI Sinus Problem: This has been ongoing for more than 2 weeks. She was seen by her ENT who told her that her right nostril was swollen. She was send home on A/B which she completed.She denies fever,she endorsed ear drainage, + cough. No hearing loss. TD:[id number] with her meds and neuro f/u. No concern today. PreDM: Diet control. Here for f/u.  Anemia:Not taking her iron supplement. Morbid obesity:Approved for Bariatric surgery. She starts bariatric class in January for weight loss prior to her procedure. GERD: On Omeprazole and Ranitidine per GI.  Current Outpatient Medications on File Prior to Visit  Medication Sig Dispense Refill  . albuterol (PROVENTIL) (2.5 MG/3ML) 0.083% nebulizer solution USE 1 VIAL IN NEBULIZER EVERY 6 HOURS AS NEEDED FOR SHORTNESS OF BREATH 150 mL 0  . Baclofen 5 MG TABS Take 1 tablet by mouth 3 (three) times daily.    . benzonatate (TESSALON) 100 MG capsule TAKE 1 CAPSULE BY MOUTH TWICE A DAY AS NEEDED FOR COUGH 20 capsule 3  . clonazePAM (KLONOPIN) 1 MG tablet TAKE 1 TABLET BY MOUTH TWICE A DAY AS NEEDED FOR ANXIETY 60 tablet 1  . ferrous sulfate 325 (65 FE) MG tablet TAKE 1 TABLET BY MOUTH EVERY DAY WITH BREAKFAST 90 tablet 3  . fluticasone (FLONASE) 50 MCG/ACT nasal spray Place 2 sprays into both nostrils daily. 16 g 6  . guaiFENesin (ROBITUSSIN) 100 MG/5ML SOLN Take 15 mLs by mouth daily as needed for cough or to loosen phlegm.    . Menthol (COUGH DROPS) 10 MG LOZG Take 1 lozenge by mouth daily as needed (cough).    . mycophenolate (CELLCEPT) 500 MG tablet Take 1,000 mg by mouth 2 (two) times daily.    [name] nystatin cream (MYCOSTATIN) Apply 1 application topically 2 (two) times daily. 30 g 0  . omeprazole (PRILOSEC) 20 MG capsule TAKE 1 CAPSULE BY MOUTH EVERY MORNING. TAKE 30-60 MINUTES BEFORE BREAKFAST. 90 capsule 0  . ranitidine (ZANTAC) 150 MG tablet Take 1 tablet  (150 mg total) by mouth daily. 90 tablet 1  . sertraline (ZOLOFT) 100 MG tablet TAKE 1 TABLET BY MOUTH EVERY DAY 90 tablet 3   No current facility-administered medications on file prior to visit.    Past Medical History:  Diagnosis Date  . Acute respiratory failure ([place])   . Acute respiratory failure with hypoxemia ([place])   . Aspiration into airway   . CAP (community acquired pneumonia) 08/05/2015  . Depression with anxiety   . Diverticulosis   . Dysphagia 03/2015    EGD, Dr Carlean Purl. mild antral gastritis, ? due to Ibuprofen.  no stricture but empirically maloney dilated esophagus.   . E. coli UTI 04/07/2015  . Elevated LDH 05/06/2015  . Encounter for routine gynecological examination 10/19/2015  . Endotracheally intubated   . Enteritis due to Clostridium difficile   . Fibroid uterus    size of a dime  . Gastrostomy infection ([place]) 11/08/2015  . Gastrostomy infection ([place]) 11/08/2015  . GERD (gastroesophageal reflux disease)    hiatal hernia  . Hypertension    "went away when I stopped smoking"  . Knee injury   . Left hip pain 02/18/2017  . Migraine    "maybe couple times/month" (03/20/2015)  . Mild intermittent asthma 06/22/2017  . Myasthenia gravis ([place]) 2017  . Myasthenia gravis with acute exacerbation ([place]) 05/15/2015  . Osteoarthritis of left  knee 12/02/2013  . Osteoarthritis of right knee 08/30/2013  . Pancreatitis 07/25/2017  . Protein-calorie malnutrition, severe ([place]) 08/07/2015  . Seasonal allergies    takes Zytrec  . Tracheostomy status ([place])   . Tubular adenoma of colon   . Tumors    "in my stomach"  . Umbilical hernia    watching , no plans for surgery at present  . VAP (ventilator-associated pneumonia) ([place])    Vitals:   02/25/18 1408  BP: 124/75  Temp: 97.8 F (36.6 C)  TempSrc: Oral  Weight: (!) 313 lb (142 kg)  Height: 5\' 1"  (1.549 m)     Review of Systems  HENT: Positive for postnasal drip and rhinorrhea. Negative for hearing loss, nosebleeds and sinus pain.         Sinus problem  Respiratory: Negative.   Cardiovascular: Negative.   Gastrointestinal: Negative.   Neurological: Negative.   All other systems reviewed and are negative.      Objective:   Physical Exam Vitals signs and nursing note reviewed.  Constitutional:      Appearance: She is obese. She is not ill-appearing.  HENT:     Head: Normocephalic.     Right Ear: Tympanic membrane, ear canal and external ear normal. No drainage or tenderness. No middle ear effusion. Tympanic membrane is not bulging.     Left Ear: Tympanic membrane, ear canal and external ear normal. No drainage or tenderness.  No middle ear effusion. Tympanic membrane is not bulging.  Neck:     Musculoskeletal: Normal range of motion. No neck rigidity.  Cardiovascular:     Rate and Rhythm: Normal rate.     Pulses: Normal pulses.     Heart sounds: No murmur.  Pulmonary:     Effort: Pulmonary effort is normal. No respiratory distress.     Breath sounds: Normal breath sounds. No wheezing.  Abdominal:     General: Abdomen is flat. Bowel sounds are normal. There is no distension.     Palpations: There is no mass.     Tenderness: There is no abdominal tenderness. There is no guarding.  Neurological:     General: No focal deficit present.     Mental Status: She is alert.        Assessment:     Allergic sinusitis MG PreDM Anemia Morbid obesity GERD    Plan:     Check problem list.

## 2018-02-25 NOTE — Assessment & Plan Note (Signed)
Determined to loss weight. Bariatric surgery planned.

## 2018-02-25 NOTE — Assessment & Plan Note (Signed)
No acute flare. Stable on current regimen. Not on steroid. Neurology F/U as planned.

## 2018-02-25 NOTE — Assessment & Plan Note (Signed)
Poor compliance with her iron supplement. She will start taking it now. Recheck CBC in 4 weeks.

## 2018-02-25 NOTE — Assessment & Plan Note (Signed)
A1C improved a lot. Likely previously elevated due to chronic steroid use. We will monitor closely.

## 2018-02-25 NOTE — Patient Instructions (Signed)
Sleeve Gastrectomy A sleeve gastrectomy is a surgery in which a large portion of the stomach is removed. After the surgery, the stomach is a narrow tube about the size of a banana. The reduced size of the stomach restricts the amount of food that you can eat, which helps you to lose weight. Tell a health care provider about:  Any allergies you have.  All medicines you are taking, including vitamins, herbs, eye drops, creams, and over-the-counter medicines.  Any problems you or family members have had with anesthetic medicines.  Any blood disorders you have.  Any surgeries you have had.  Any medical conditions you have.  Whether you are pregnant or may be pregnant, if this applies. What are the risks? Generally, this is a safe procedure. However, problems may occur, including:  Infection.  Bleeding.  Allergic reactions to medicines.  Damage to other structures or organs.  Blood clots.  Leakage of fluid from the stomach into the abdominal cavity. (This is rare.) What happens before the procedure?  Ask your health care provider about: ? Changing or stopping your regular medicines. This is especially important if you are taking diabetes medicines or blood thinners. ? Taking medicines such as aspirin and ibuprofen. These medicines can thin your blood. Do not take these medicines before your procedure if your health care provider instructs you not to.  You may have tests, including: ? Blood tests. ? Urine tests. ? Stool tests. ? X-rays. ? Ultrasound. ? Esophageal manometry. This checks the tube that carries food and liquids from your mouth to your stomach (esophagus).  Follow instructions from your health care provider about eating or drinking restrictions.  Plan to have someone take you home after the procedure.  If you will be going home right after the procedure, plan to have someone with you for 24 hours.  Ask your health care provider how your surgical site will be  marked or identified.  You may be given antibiotic medicine to help prevent infection. What happens during the procedure?  To reduce your risk of infection: ? Your health care team will wash or sanitize their hands. ? Your skin will be washed with soap.  An IV tube will be inserted into one of your veins.  You will be given a medicine to make you fall asleep (general anesthetic). You may also be given a medicine to help you relax (sedative).  Several small incisions will be made in your abdomen.  A thin, lighted tube with a tiny camera on the end (laparoscope) will be inserted into one of the incisions. The camera sends a picture to a TV screen in the operating room. This gives the surgeon a good view of the stomach.  Small surgical instruments will be put through the other incisions.  Part of your stomach will be cut and removed through one of the incisions.  The remaining part of your stomach will be closed using stitches (sutures).  A small tube (drain) may be placed through one of the incisions to allow extra fluid to flow from the area.  Your incisions may be closed with sutures, staples, or skin glue.  Your incisions may be covered with a bandage (dressing). The procedure may vary among health care providers and hospitals. What happens after the procedure?  You will continue to receive fluids and medicines through an IV tube.  You may have some pain and nausea. Medicines will be available to help you.  You may have fluid leaking from a drain  in one of your incisions.  You may have to wear compression stockings. These stockings help to prevent blood clots and reduce swelling in your legs.  You will be encouraged to walk around several times a day. This helps to prevent blood clots.  You will be started on a liquid diet the first day after your procedure. You may have some tests to check if you are ready to start the liquid diet.  You will be encouraged to cough and to  perform deep-breathing exercises. This helps to prevent a lung infection.  Do not drive for 24 hours if you received a sedative. This information is not intended to replace advice given to you by your health care provider. Make sure you discuss any questions you have with your health care provider. Document Released: 12/15/2008 Document Revised: 11/12/2015 Document Reviewed: 08/11/2014 Elsevier Interactive Patient Education  2019 [employer].

## 2018-02-25 NOTE — Assessment & Plan Note (Signed)
Zyrtec refilled. I started her on Singulair. F/U with ENT soon.

## 2018-03-14 ENCOUNTER — Other Ambulatory Visit: Payer: Self-pay | Admitting: Nurse Practitioner

## 2018-03-15 ENCOUNTER — Ambulatory Visit: Payer: Medicare Other

## 2018-03-22 ENCOUNTER — Encounter: Payer: BC Managed Care – PPO | Attending: General Surgery | Admitting: Skilled Nursing Facility1

## 2018-03-22 DIAGNOSIS — E669 Obesity, unspecified: Secondary | ICD-10-CM

## 2018-03-23 ENCOUNTER — Encounter: Payer: Self-pay | Admitting: Skilled Nursing Facility1

## 2018-03-23 NOTE — Progress Notes (Signed)
Pre-Operative Nutrition Class:  Appt start time: 2076   End time:  1830.  Patient was seen on 03/22/2018 for Pre-Operative Bariatric Surgery Education at [facility].   Sleeve  Start weight at [facility]: 313.9 lbs Weight: 315.5 BMI: 59.61 kg/m2  Samples given per MNT protocol. Patient educated on appropriate usage: Bariatric Advantage Multivitamin Lot # [id number] Exp: 04/20  Bariatric Advantage Calcium  Lot # [id number] Exp: may/05/2018  Renee Pain Protein Shake Lot # [id number] Exp: May 31 2018 The following the learning objectives were met by the patient during this course:  Identify Pre-Op Dietary Goals and will begin 2 weeks pre-operatively  Identify appropriate sources of fluids and proteins   State protein recommendations and appropriate sources pre and post-operatively  Identify Post-Operative Dietary Goals and will follow for 2 weeks post-operatively  Identify appropriate multivitamin and calcium sources  Describe the need for physical activity post-operatively and will follow MD recommendations  State when to call healthcare provider regarding medication questions or post-operative complications  Handouts given during class include:  Pre-Op Bariatric Surgery Diet Handout  Protein Shake Handout  Post-Op Bariatric Surgery Nutrition Handout  BELT Program Information Flyer  Support Group Information Flyer  [facility] Bariatric Supplements Price List  Follow-Up Plan: Patient will follow-up at [facility] 2 weeks post operatively for diet advancement per MD.

## 2018-05-05 ENCOUNTER — Other Ambulatory Visit: Payer: Self-pay | Admitting: Family Medicine

## 2018-06-08 ENCOUNTER — Other Ambulatory Visit: Payer: Self-pay | Admitting: Nurse Practitioner

## 2018-06-21 ENCOUNTER — Other Ambulatory Visit: Payer: Self-pay

## 2018-06-21 ENCOUNTER — Other Ambulatory Visit: Payer: Self-pay | Admitting: Family Medicine

## 2018-06-21 ENCOUNTER — Telehealth (INDEPENDENT_AMBULATORY_CARE_PROVIDER_SITE_OTHER): Payer: BC Managed Care – PPO | Admitting: Family Medicine

## 2018-06-21 DIAGNOSIS — R059 Cough, unspecified: Secondary | ICD-10-CM

## 2018-06-21 DIAGNOSIS — R109 Unspecified abdominal pain: Secondary | ICD-10-CM | POA: Insufficient documentation

## 2018-06-21 DIAGNOSIS — R1011 Right upper quadrant pain: Secondary | ICD-10-CM

## 2018-06-21 DIAGNOSIS — R05 Cough: Secondary | ICD-10-CM

## 2018-06-21 DIAGNOSIS — R0602 Shortness of breath: Secondary | ICD-10-CM

## 2018-06-21 MED ORDER — DOCUSATE SODIUM 100 MG PO CAPS: 100.0000 mg | ORAL_CAPSULE | Freq: Two times a day (BID) | ORAL | 0 refills | Status: DC | PRN

## 2018-06-21 NOTE — Progress Notes (Signed)
[facility] Tele[facility]icine Visit  Patient consented to have virtual visit. Method of visit: Video  Encounter participants: Patient: Frances Medina - located at Home Provider: Andrena Mews - located at Office Others (if applicable): NA  Chief Complaint: Bloating and RUQ abdominal pain  HPI:  Abdominal Pain  This is a recurrent (RUQ pain) problem. The current episode started more than 1 month ago. The onset quality is gradual. The problem occurs intermittently. The problem has been waxing and waning. The pain is located in the RUQ. The pain is at a severity of 6/10 (At times about 10/10 in severity but currently it is about 6/10 in severity). The pain is moderate. The quality of the pain is aching. The abdominal pain radiates to the back. Associated symptoms include anorexia and constipation. Pertinent negatives include no diarrhea, fever, hematochezia, melena, nausea or vomiting. Associated symptoms comments: Appetite is low due to pain. Whenever she eats, feels like she will have difficulty breathing. She is constipated, last BM was today, uses MgCitrate prn constipation. . Nothing aggravates the pain. The pain is relieved by bowel movements. Treatments tried: Magnesium Citrate prn constipation and Ibuprofen. The treatment provided moderate relief. Her past medical history is significant for gallstones and pancreatitis. Feels like this is related to her Gallstones  Shortness of Breath  This is a new (Feels it is related to her stomach, only occurs whenever she is eating or feels bloated) problem. The problem occurs intermittently. Progression since onset: Occurs only when she is bloated. Associated symptoms include abdominal pain. Pertinent negatives include no chest pain, coryza, fever, leg swelling, orthopnea, PND or vomiting. The symptoms are aggravated by eating (Bloating). Associated symptoms comments: Wears CPAP at night. No new leg swelling. Treatments tried: Bowel  movement. The treatment provided significant relief. (Feels like this is related to her Gallstones)     ROS: per HPI  Pertinent PMHx: Problem list reviewed  Exam:  Respiratory: No respiratory distress  Assessment/Plan:  Abdominal pain ?? Constipation associated with bloating. Differentials includes cholecystitis vs pancreatitis.  She has hx of mild pancreatitis in the past. Colace prescribed for constipation. May continue Magnesium citrate prn. Ibuprofen as needed for pain. GasX for bloating recommended. She will obtain OTC. Avoid fatty food or trigger food. She will benefit from RUQ [place] if symptoms persists. Red flag signs and return precaution discussed. She will contact me soon or go to the ED if there is no improvement of her symptoms\  SOB (shortness of breath) Related to feeling bloated. Denies Cardiopulm symptoms. Treat bloating. Return precaution discussed.    Time spent during visit with patient: 18 minutes

## 2018-06-21 NOTE — Assessment & Plan Note (Signed)
Related to feeling bloated. Denies Cardiopulm symptoms. Treat bloating. Return precaution discussed.

## 2018-06-21 NOTE — Assessment & Plan Note (Addendum)
??   Constipation associated with bloating. Differentials includes cholecystitis vs pancreatitis.  She has hx of mild pancreatitis in the past. Colace prescribed for constipation. May continue Magnesium citrate prn. Ibuprofen as needed for pain. GasX for bloating recommended. She will obtain OTC. Avoid fatty food or trigger food. She will benefit from RUQ [place] if symptoms persists. Red flag signs and return precaution discussed. She will contact me soon or go to the ED if there is no improvement of her symptoms\

## 2018-07-22 DIAGNOSIS — Z79899 Other long term (current) drug therapy: Secondary | ICD-10-CM | POA: Diagnosis not present

## 2018-07-29 DIAGNOSIS — G7000 Myasthenia gravis without (acute) exacerbation: Secondary | ICD-10-CM | POA: Diagnosis not present

## 2018-07-29 DIAGNOSIS — R0602 Shortness of breath: Secondary | ICD-10-CM | POA: Diagnosis not present

## 2018-07-29 DIAGNOSIS — Z79899 Other long term (current) drug therapy: Secondary | ICD-10-CM | POA: Diagnosis not present

## 2018-08-01 ENCOUNTER — Other Ambulatory Visit: Payer: Self-pay | Admitting: Family Medicine

## 2018-08-02 ENCOUNTER — Telehealth: Payer: Self-pay | Admitting: Family Medicine

## 2018-08-02 NOTE — Telephone Encounter (Signed)
Attempted to schedule patient but she needs to check her schedule and get back to me about date/time works best for her.  Kaelei Wheeler,CMA

## 2018-08-02 NOTE — Telephone Encounter (Signed)
Hello [place] blue team,  Please, help patient schedule office visit appointment for SOB. She was recently seen at [facility] and she requires further evaluation.  Thanks.   Differentials: Anxiety, Obesity, Sleep apnea.

## 2018-08-04 ENCOUNTER — Encounter: Payer: Self-pay | Admitting: Family Medicine

## 2018-08-06 ENCOUNTER — Other Ambulatory Visit: Payer: Self-pay

## 2018-08-06 ENCOUNTER — Ambulatory Visit (INDEPENDENT_AMBULATORY_CARE_PROVIDER_SITE_OTHER): Payer: BC Managed Care – PPO | Admitting: Family Medicine

## 2018-08-06 ENCOUNTER — Encounter: Payer: Self-pay | Admitting: Family Medicine

## 2018-08-06 VITALS — BP 125/75 | HR 76 | Ht 61.0 in | Wt 321.8 lb

## 2018-08-06 DIAGNOSIS — Z9989 Dependence on other enabling machines and devices: Secondary | ICD-10-CM | POA: Diagnosis not present

## 2018-08-06 DIAGNOSIS — D509 Iron deficiency anemia, unspecified: Secondary | ICD-10-CM | POA: Diagnosis not present

## 2018-08-06 DIAGNOSIS — G4733 Obstructive sleep apnea (adult) (pediatric): Secondary | ICD-10-CM

## 2018-08-06 DIAGNOSIS — R0602 Shortness of breath: Secondary | ICD-10-CM

## 2018-08-06 DIAGNOSIS — G7000 Myasthenia gravis without (acute) exacerbation: Secondary | ICD-10-CM | POA: Diagnosis not present

## 2018-08-06 DIAGNOSIS — F411 Generalized anxiety disorder: Secondary | ICD-10-CM | POA: Diagnosis not present

## 2018-08-06 DIAGNOSIS — F321 Major depressive disorder, single episode, moderate: Secondary | ICD-10-CM

## 2018-08-06 DIAGNOSIS — D649 Anemia, unspecified: Secondary | ICD-10-CM

## 2018-08-06 MED ORDER — SERTRALINE HCL 50 MG PO TABS: 150.0000 mg | ORAL_TABLET | Freq: Every day | ORAL | 3 refills | Status: DC

## 2018-08-06 NOTE — Assessment & Plan Note (Signed)
No acute flare. Monitor closely with neuro f/u.

## 2018-08-06 NOTE — Assessment & Plan Note (Signed)
Recurrent. Continue current dose of Clonazepam. Increase Zoloft from 100 mg qd to 150 mg qd. Psych referral done. I will also connect her with our [facility] personnel for counseling. Not currently suicidal. ED precaution discussed.

## 2018-08-06 NOTE — Assessment & Plan Note (Signed)
Check CBC today.  

## 2018-08-06 NOTE — Assessment & Plan Note (Signed)
Continue current dose of Clonazepam. Increase Zoloft from 100 mg qd to 150 mg qd. Psych referral done. I will also connect her with our [facility] personnel for counseling. Not currently suicidal. ED precaution discussed.

## 2018-08-06 NOTE — Progress Notes (Signed)
Subjective:     Patient ID: Frances Medina, female   DOB: 1961/01/23, 58 y.o.   MRN: [id number]  Shortness of Breath  This is a chronic problem. The current episode started more than 1 month ago (Started 3 months ago). The problem has been gradually worsening. Associated symptoms include orthopnea, PND and wheezing. Pertinent negatives include no chest pain, fever, leg swelling, rhinorrhea, sputum production or vomiting. The symptoms are aggravated by emotional upset, exercise and eating (Walking around makes it worse, even from her kitchen to the living room). Associated symptoms comments: Currently no chest pain. She endorsed chest pain whenever she has SOB. . She has tried beta agonist inhalers for the symptoms. The treatment provided moderate relief. There is no history of asthma or COPD.  Anxiety  Presents for follow-up visit. Symptoms include decreased concentration, insomnia, nervous/anxious behavior and shortness of breath. Patient reports no chest pain, depressed mood, restlessness or suicidal ideas. Primary symptoms comment: She is stress because of her sick mother inlaw. Two family members passed away. Difficulty with sleeping at night because she feels over tired. . Symptoms occur most days. The quality of sleep is fair. Nighttime awakenings: several.   There is no history of asthma or suicide attempts. Compliance with medications is 76-100% (On Klonazepam and Zoloft. She is compliant with her meds.).  Depression       (She is stress because of her sick mother inlaw. Two family members passed away. Difficulty with sleeping at night because she feels over tired. )  This is a recurrent problem.  The current episode started more than 1 month ago.   The onset quality is gradual.   The problem occurs intermittently.  The problem has been gradually worsening since onset.  Associated symptoms include decreased concentration, hopelessness, insomnia, irritable, decreased interest, appetite change and  sad.  Associated symptoms include no restlessness and no suicidal ideas.     The symptoms are aggravated by social issues and family issues.  Past treatments include SSRIs - Selective serotonin reuptake inhibitors (Zoloft 100 mg qd).  Compliance with treatment is good.  Previous treatment provided moderate relief.  Risk factors: Lost two family members in the last few weeks.   Past medical history includes chronic illness and anxiety.     Pertinent negatives include no suicide attempts. OSA: Nightly CPAP. Helps a lot. Feels some choking as if she can't get air while sleeping.  Anemia: Compliant with her iron pills. No bleeding. Here for f/u. Obesity: She has not been able to get some exercise due to her breathing. She eats poorly as well. Appetite is low, hence she eats junk food and candy.  Current Outpatient Medications on File Prior to Visit  Medication Sig Dispense Refill  . albuterol (PROVENTIL) (2.5 MG/3ML) 0.083% nebulizer solution USE 1 VIAL IN NEBULIZER EVERY 6 HOURS AS NEEDED FOR SHORTNESS OF BREATH 150 mL 0  . benzonatate (TESSALON) 100 MG capsule TAKE 1 CAPSULE BY MOUTH TWICE A DAY AS NEEDED FOR COUGH 20 capsule 3  . cetirizine (ZYRTEC) 10 MG tablet TAKE 1 TABLET BY MOUTH EVERY DAY 90 tablet 2  . clonazePAM (KLONOPIN) 0.5 MG tablet Take 1 tablet (0.5 mg total) by mouth 2 (two) times daily as needed for anxiety. 60 tablet 1  . ferrous sulfate 325 (65 FE) MG tablet TAKE 1 TABLET BY MOUTH EVERY DAY WITH BREAKFAST 90 tablet 3  . fluticasone (FLONASE) 50 MCG/ACT nasal spray Place 2 sprays into both nostrils daily. 16 g 6  .  guaiFENesin (ROBITUSSIN) 100 MG/5ML SOLN Take 15 mLs by mouth daily as needed for cough or to loosen phlegm.    . montelukast (SINGULAIR) 10 MG tablet Take 1 tablet (10 mg total) by mouth at bedtime. 90 tablet 1  . mycophenolate (CELLCEPT) 500 MG tablet Take 1,000 mg by mouth 2 (two) times daily.    [name] nystatin cream (MYCOSTATIN) Apply 1 application topically 2 (two) times  daily. 30 g 0  . omeprazole (PRILOSEC) 20 MG capsule TAKE 1 CAPSULE BY MOUTH EVERY MORNING. TAKE 30-60 MINUTES BEFORE BREAKFAST. 90 capsule 0  . docusate sodium (COLACE) 100 MG capsule Take 1 capsule (100 mg total) by mouth 2 (two) times daily as needed for mild constipation or moderate constipation. (Patient not taking: Reported on 08/06/2018) 10 capsule 0  . LACTOBACILLUS PO Take 1 tablet by mouth.    . Menthol (COUGH DROPS) 10 MG LOZG Take 1 lozenge by mouth daily as needed (cough).     No current facility-administered medications on file prior to visit.    Past Medical History:  Diagnosis Date  . Acute respiratory failure ([place])   . Acute respiratory failure with hypoxemia ([place])   . Aspiration into airway   . CAP (community acquired pneumonia) 08/05/2015  . Depression with anxiety   . Diverticulosis   . Dysphagia 03/2015    EGD, Dr Carlean Purl. mild antral gastritis, ? due to Ibuprofen.  no stricture but empirically maloney dilated esophagus.   . E. coli UTI 04/07/2015  . Elevated LDH 05/06/2015  . Encounter for routine gynecological examination 10/19/2015  . Endotracheally intubated   . Enteritis due to Clostridium difficile   . Fibroid uterus    size of a dime  . Gastrostomy infection ([place]) 11/08/2015  . Gastrostomy infection ([place]) 11/08/2015  . GERD (gastroesophageal reflux disease)    hiatal hernia  . Hypertension    "went away when I stopped smoking"  . Knee injury   . Left hip pain 02/18/2017  . Migraine    "maybe couple times/month" (03/20/2015)  . Mild intermittent asthma 06/22/2017  . Myasthenia gravis ([place]) 2017  . Myasthenia gravis with acute exacerbation ([place]) 05/15/2015  . Osteoarthritis of left knee 12/02/2013  . Osteoarthritis of right knee 08/30/2013  . Pancreatitis 07/25/2017  . Protein-calorie malnutrition, severe ([place]) 08/07/2015  . Seasonal allergies    takes Zytrec  . Tracheostomy status ([place])   . Tubular adenoma of colon   . Tumors    "in my stomach"  . Umbilical hernia     watching , no plans for surgery at present  . VAP (ventilator-associated pneumonia) ([place])    Vitals:   08/06/18 1519 08/06/18 1528  BP: 125/75   Pulse: 76   SpO2: 96% 96%  Weight: (!) 321 lb 12.8 oz (146 kg)   Height: 5\' 1"  (1.549 m)      Review of Systems  Constitutional: Positive for appetite change. Negative for fever.  HENT: Negative for rhinorrhea and trouble swallowing.   Respiratory: Positive for shortness of breath and wheezing. Negative for sputum production.   Cardiovascular: Positive for orthopnea and PND. Negative for chest pain and leg swelling.  Gastrointestinal: Negative for vomiting.  Musculoskeletal: Negative.   Neurological: Negative.   Psychiatric/Behavioral: Positive for decreased concentration and depression. Negative for suicidal ideas. The patient is nervous/anxious and has insomnia.        Objective:   Physical Exam Vitals signs and nursing note reviewed.  Constitutional:      General: She is  irritable.     Appearance: She is well-developed. She is obese.  Neck:     Musculoskeletal: Normal range of motion and neck supple.  Cardiovascular:     Rate and Rhythm: Normal rate and regular rhythm.     Pulses: Normal pulses.     Heart sounds: Normal heart sounds. No murmur.  Pulmonary:     Effort: Pulmonary effort is normal. No respiratory distress.     Breath sounds: Normal breath sounds. No wheezing or rhonchi.  Abdominal:     General: Bowel sounds are normal. There is no distension.     Palpations: Abdomen is soft. There is no mass.     Tenderness: There is no abdominal tenderness.  Musculoskeletal:     Comments: B/L trace edema  Neurological:     Mental Status: She is alert and oriented to person, place, and time.  Psychiatric:        Attention and Perception: Attention normal.        Mood and Affect: Affect normal.        Speech: Speech normal.        Behavior: Behavior normal.        Thought Content: Thought content normal.         Cognition and Memory: Cognition normal.        Judgment: Judgment normal.    GAD 7 : Generalized Anxiety Score 08/06/2018  Nervous, Anxious, on Edge 2  Control/stop worrying 0  Worry too much - different things 3  Trouble relaxing 2  Restless 2  Easily annoyed or irritable 3  Afraid - awful might happen 2  Total GAD 7 Score 14  Anxiety Difficulty Very difficult      Office Visit from 08/06/2018 in [place]  PHQ-9 Total Score  24         Assessment:     Dyspnea Anxiety Depression OSA Anemia Obesity    Plan:     Check problem list

## 2018-08-06 NOTE — Assessment & Plan Note (Signed)
Compliant with CPAP. ?? Need setting titration given her new symptoms. I referred her to sleep clinic for CPAP titration.

## 2018-08-06 NOTE — Assessment & Plan Note (Signed)
Body mass index is 60.8 kg/m. Weight increased. Diet counseling Done. I will discuss nutritionist referral at f/u. Monitor weight closely. We had discussed bariatric surgery in the past. I will follow-up on it.

## 2018-08-06 NOTE — Assessment & Plan Note (Signed)
Likely multifactorial. Anxiety vs OSA vs Obesity Vs Anemia Vs MG. R/O CHF. See treatment for anxiety and depression below. Check CBC. Obtain ECHO. Referral to Dr. Valentina Lucks for repeat PFT. 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 Continue albuterol as needed. Risk for COPD low given no smoking hx. F/U Neuro for MG.

## 2018-08-06 NOTE — Patient Instructions (Addendum)
  Please schedule lung function test with Dr. Valentina Lucks.  Shortness of Breath, Adult Shortness of breath means you have trouble breathing. Shortness of breath could be a sign of a medical problem. Follow these instructions at home:   Watch for any changes in your symptoms.  Do not use any products that contain nicotine or tobacco, such as cigarettes, e-cigarettes, and chewing tobacco.  Do not smoke. Smoking can cause shortness of breath. If you need help to quit smoking, ask your doctor.  Avoid things that can make it harder to breathe, such as: ? Mold. ? Dust. ? Air pollution. ? Chemical smells. ? Things that can cause allergy symptoms (allergens), if you have allergies.  Keep your living space clean. Use products that help remove mold and dust.  Rest as needed. Slowly return to your normal activities.  Take over-the-counter and prescription medicines only as told by your doctor. This includes oxygen therapy and inhaled medicines.  Keep all follow-up visits as told by your doctor. This is important. Contact a doctor if:  Your condition does not get better as soon as expected.  You have a hard time doing your normal activities, even after you rest.  You have new symptoms. Get help right away if:  Your shortness of breath gets worse.  You have trouble breathing when you are resting.  You feel light-headed or you pass out (faint).  You have a cough that is not helped by medicines.  You cough up blood.  You have pain with breathing.  You have pain in your chest, arms, shoulders, or belly (abdomen).  You have a fever.  You cannot walk up stairs.  You cannot exercise the way you normally do. These symptoms may represent a serious problem that is an emergency. Do not wait to see if the symptoms will go away. Get medical help right away. Call your local emergency services (911 in the [place] Do not drive yourself to the hospital. Summary  Shortness of breath is when you  have trouble breathing enough air. It can be a sign of a medical problem.  Avoid things that make it hard for you to breathe, such as smoking, pollution, mold, and dust.  Watch for any changes in your symptoms. Contact your doctor if you do not get better or you get worse. This information is not intended to replace advice given to you by your health care provider. Make sure you discuss any questions you have with your health care provider. Document Released: 08/06/2007 Document Revised: 07/20/2017 Document Reviewed: 07/20/2017 Elsevier Interactive Patient Education  2019 [employer].

## 2018-08-07 LAB — CMP14+EGFR
ALT: 12 IU/L (ref 0–32)
AST: 12 IU/L (ref 0–40)
Albumin/Globulin Ratio: 2 (ref 1.2–2.2)
Albumin: 4.3 g/dL (ref 3.8–4.9)
Alkaline Phosphatase: 116 IU/L (ref 39–117)
BUN/Creatinine Ratio: 16 (ref 9–23)
BUN: 9 mg/dL (ref 6–24)
Bilirubin Total: <0.2 mg/dL (ref 0.0–1.2)
CO2: 22 mmol/L (ref 20–29)
Calcium: 9.1 mg/dL (ref 8.7–10.2)
Chloride: 104 mmol/L (ref 96–106)
Creatinine, Ser: 0.55 mg/dL — ABNORMAL LOW (ref 0.57–1.00)
GFR calc Af Amer: 120 mL/min/{1.73_m2} (ref >59)
GFR calc non Af Amer: 104 mL/min/{1.73_m2} (ref >59)
Globulin, Total: 2.2 g/dL (ref 1.5–4.5)
Glucose: 85 mg/dL (ref 65–99)
Potassium: 3.8 mmol/L (ref 3.5–5.2)
Sodium: 142 mmol/L (ref 134–144)
Total Protein: 6.5 g/dL (ref 6.0–8.5)

## 2018-08-07 LAB — CBC WITH DIFFERENTIAL/PLATELET
Basophils Absolute: 0 10*3/uL (ref 0.0–0.2)
Basos: 0 %
EOS (ABSOLUTE): 0 10*3/uL (ref 0.0–0.4)
Eos: 0 %
Hematocrit: 35.6 % (ref 34.0–46.6)
Hemoglobin: 11.2 g/dL (ref 11.1–15.9)
Immature Grans (Abs): 0 10*3/uL (ref 0.0–0.1)
Immature Granulocytes: 0 %
Lymphocytes Absolute: 2 10*3/uL (ref 0.7–3.1)
Lymphs: 22 %
MCH: 22 pg — ABNORMAL LOW (ref 26.6–33.0)
MCHC: 31.5 g/dL (ref 31.5–35.7)
MCV: 70 fL — ABNORMAL LOW (ref 79–97)
Monocytes Absolute: 0.5 10*3/uL (ref 0.1–0.9)
Monocytes: 6 %
Neutrophils Absolute: 6.4 10*3/uL (ref 1.4–7.0)
Neutrophils: 72 %
Platelets: 258 10*3/uL (ref 150–450)
RBC: 5.09 x10E6/uL (ref 3.77–5.28)
RDW: 16.4 % — ABNORMAL HIGH (ref 11.7–15.4)
WBC: 9 10*3/uL (ref 3.4–10.8)

## 2018-08-07 LAB — TSH: TSH: 2.1 u[IU]/mL (ref 0.450–4.500)

## 2018-08-09 ENCOUNTER — Telehealth: Payer: Self-pay | Admitting: Family Medicine

## 2018-08-09 NOTE — Telephone Encounter (Signed)
I spoke with [facility] with first initial of her last name "B".  ECHO approved. Authorization number: [id number] Valid date: 08/09/2018 to 02/04/2019

## 2018-08-10 ENCOUNTER — Encounter (HOSPITAL_COMMUNITY): Payer: Self-pay

## 2018-08-10 ENCOUNTER — Ambulatory Visit (HOSPITAL_COMMUNITY): Payer: BC Managed Care – PPO

## 2018-08-10 ENCOUNTER — Telehealth: Payer: Self-pay | Admitting: Licensed Clinical Social Worker

## 2018-08-10 NOTE — Telephone Encounter (Signed)
   Phone Outreach Note  08/10/2018 Name: Frances Medina MRN: [id number] DOB: 05-06-1960  Referred by: [name], MD Reason for referral : Care Coordination (depression and anxiety)  Confirmed patient's name: Yes    Confirmed patient's date of birth  Yes   Frances Medina is a 58 y.o. female referred by Dr. Gwendlyn Deutscher for concerns with symptoms of depression and anxiety.  Called patient to assess needs. Patient would like to schedule phone appointment for next week.    Plan: Phone appointment scheduled June 19th at 9:30 with LCSW. [name], MD has been notified of this outreach and Ms. Briane A Hemminger's plan.   Casimer Lanius, [facility]   [phone] 2:03 PM

## 2018-08-10 NOTE — Progress Notes (Signed)
I want to refer to [facility]. I will double check to make sure order is in place. Thanks.

## 2018-08-10 NOTE — Progress Notes (Signed)
Psych referral was placed on 6/5 to [place]. Kennyth Lose, could you please help with this?   To Specialty To Provider To Location/Place of Service To Department  Psychiatry none none [facility] [place]

## 2018-08-12 ENCOUNTER — Other Ambulatory Visit: Payer: Self-pay | Admitting: Family Medicine

## 2018-08-12 DIAGNOSIS — G4733 Obstructive sleep apnea (adult) (pediatric): Secondary | ICD-10-CM

## 2018-08-12 DIAGNOSIS — R0602 Shortness of breath: Secondary | ICD-10-CM

## 2018-08-12 DIAGNOSIS — G7000 Myasthenia gravis without (acute) exacerbation: Secondary | ICD-10-CM | POA: Diagnosis not present

## 2018-08-12 DIAGNOSIS — Z9989 Dependence on other enabling machines and devices: Secondary | ICD-10-CM

## 2018-08-12 NOTE — Addendum Note (Signed)
Addended by: Andrena Mews T on: 08/12/2018 09:02 AM   Modules accepted: Orders

## 2018-08-17 ENCOUNTER — Telehealth: Payer: Self-pay

## 2018-08-17 NOTE — Telephone Encounter (Signed)
Spoke with pt. She said that she will check with her husband to see what days he would be available. I told pt to call us back with that information so that we can get her ECHO scheduled at [place], CMA

## 2018-08-18 ENCOUNTER — Other Ambulatory Visit: Payer: Self-pay | Admitting: Family Medicine

## 2018-08-18 NOTE — Telephone Encounter (Signed)
Spoke with pt.  She states that she is available any day but prefers early AM.  She would like at least one notice so her husband can take off.  Will forward back to [facility] team. Christen Bame, CMA

## 2018-08-18 NOTE — Telephone Encounter (Signed)
LM for cardiovascular lab to call back and schedule echo for patient.  Frances Medina,CMA

## 2018-08-18 NOTE — Telephone Encounter (Signed)
appt made for 08-23-2018 at 8:15a.  Patient made aware. Jazmin Hartsell,CMA

## 2018-08-18 NOTE — Telephone Encounter (Signed)
Spoke with pt again. She said that she still doesn't have any dates as to when she will be able to make an appt to have her ECHO done.  Pt said that she will call us back when she gets a day that she can schedule  that appt. Salvatore Marvel, CMA

## 2018-08-19 ENCOUNTER — Ambulatory Visit: Payer: Medicare Other | Admitting: Pharmacist

## 2018-08-20 ENCOUNTER — Ambulatory Visit: Payer: Medicare Other

## 2018-08-23 ENCOUNTER — Other Ambulatory Visit: Payer: Self-pay

## 2018-08-23 ENCOUNTER — Ambulatory Visit (HOSPITAL_COMMUNITY)
Admission: RE | Admit: 2018-08-23 | Discharge: 2018-08-23 | Disposition: A | Discharge status: Home / Self Care | Payer: BC Managed Care – PPO | Source: Ambulatory Visit | Attending: Family Medicine | Admitting: Family Medicine

## 2018-08-23 DIAGNOSIS — G4733 Obstructive sleep apnea (adult) (pediatric): Secondary | ICD-10-CM | POA: Diagnosis not present

## 2018-08-23 DIAGNOSIS — R0602 Shortness of breath: Secondary | ICD-10-CM | POA: Insufficient documentation

## 2018-08-23 DIAGNOSIS — Z9989 Dependence on other enabling machines and devices: Secondary | ICD-10-CM | POA: Insufficient documentation

## 2018-08-23 NOTE — Progress Notes (Signed)
  Echocardiogram 2D Echocardiogram has been performed.  Jannett Celestine 08/23/2018, 9:37 AM

## 2018-08-26 ENCOUNTER — Ambulatory Visit: Payer: Medicare Other | Admitting: Pharmacist

## 2018-08-29 ENCOUNTER — Other Ambulatory Visit: Payer: Self-pay | Admitting: Family Medicine

## 2018-08-31 ENCOUNTER — Telehealth: Payer: Self-pay | Admitting: *Deleted

## 2018-08-31 ENCOUNTER — Telehealth: Payer: Self-pay | Admitting: Licensed Clinical Social Worker

## 2018-08-31 NOTE — Telephone Encounter (Signed)
Care Coordination  Telephone Outreach Note  08/31/2018 Name: Frances Medina MRN: [id number] DOB: 1960-03-26  Referred by: [name], MD Reason for referral : Care Coordination (F/U call for symptoms of depression)  Phone call to [name] reference assessing for the above referral. Phone appointment scheduled with patient for June 19th however patient canceled the appointment.   Confirmed patient's date of birth  Yes   ASSESSMENT: Patient reports she is taking medication daily and noticed she is doing much betting since the increase in Sertraline from 100mg  to 150mg .  She does not believe she needs any additional F/U from LCSW but was appreciative for the F.U call  Intervention:Client interviewed and appropriate assessments performed. Review of patient's consultants notes from appropriate care team members was performed as part of referral. Discussed: community resource options, symptoms on medication and ongoing support.  Plan: LCSW will discontinue outreach calls at patient's request but will gladly be available at any time to provide services to [name] if needed.   [name], MD has been notified of this outreach and Ms. Abriel A Whitecotton's plan.   Casimer Medina, [place]   [phone] 9:29 AM

## 2018-08-31 NOTE — Telephone Encounter (Signed)
A message was left, re: follow up visit. 

## 2018-09-02 ENCOUNTER — Other Ambulatory Visit: Payer: Self-pay | Admitting: Nurse Practitioner

## 2018-09-06 ENCOUNTER — Other Ambulatory Visit: Payer: Self-pay | Admitting: Family Medicine

## 2018-09-07 ENCOUNTER — Other Ambulatory Visit: Payer: Self-pay | Admitting: Family Medicine

## 2018-09-07 NOTE — Telephone Encounter (Signed)
A second message was left, re: follow up visit. 

## 2018-09-09 ENCOUNTER — Encounter: Payer: Self-pay | Admitting: Pharmacist

## 2018-09-09 ENCOUNTER — Ambulatory Visit (INDEPENDENT_AMBULATORY_CARE_PROVIDER_SITE_OTHER): Payer: BC Managed Care – PPO | Admitting: Pharmacist

## 2018-09-09 ENCOUNTER — Other Ambulatory Visit: Payer: Self-pay

## 2018-09-09 ENCOUNTER — Ambulatory Visit: Payer: Medicare Other | Admitting: Pharmacist

## 2018-09-09 DIAGNOSIS — J984 Other disorders of lung: Secondary | ICD-10-CM

## 2018-09-09 NOTE — Patient Instructions (Signed)
Nice to see you today.   Lung function test results were similar to those completed in 2018.   No change in your medications today.   Please try to take your benzonatate TWICE daily (take an dose at bedtime).   Please try to increase walking/activity.  Follow up with Dr. Gwendlyn Deutscher.

## 2018-09-09 NOTE — Assessment & Plan Note (Signed)
Patient has been experiencing shortness of breath since her hospitalization in 2017.  Currently she reports taking albuterol 3 to 4 times a day.  Spirometry evaluation reveals possible restrictive/ near normal lung function.  Spirometry is unchanged since last evaluation in 2018.  Post nebulized albuterol tx revealed non-significant change in lung function.   -no change in therapy today   -We briefly discussed how her breathing may improve post bariatric procedure she anticipates in the future. encouraged walking to improve lung conditioning/fitness.  -Reviewed results of pulmonary function tests.  Pt verbalized understanding of results and education.

## 2018-09-09 NOTE — Progress Notes (Signed)
   S:    Patient arrives to clinicin good spirits and ambulating without assistance. She presents for lung function evaluation.  History is significant for myasthenia gravis. Patient was referred and last seen by Primary Care Provider on 08/06/2018.   Patient reports SOB multiple times a day. Patient reports using albuterol inhaler 3 to 4 times a day. Breathing is worst after eating full meals, so patient sometimes eats only snacks. Patient also reports trach site is bothersome and complains of cough to clear her throat is routine, throughout the day.   Patient reports adherence to medications Patient reports last dose of restrictive lung disease medications was yesterday.  Current restrictive lung disease medications: albuterol, benzonatate for cough, additionally; montelukast, and mycophenolate. Rescue inhaler use frequency: 3-4 times a day Patient exacerbation hx: frequent  O: Physical Exam Constitutional:      Appearance: She is obese.  Pulmonary:     Effort: Pulmonary effort is normal.  Neurological:     Mental Status: She is alert.  Psychiatric:        Mood and Affect: Mood normal.    Review of Systems  Respiratory: Positive for cough, sputum production and shortness of breath.    Vitals BP 146/82 (right arm, sitting)  HR 78  Weight 320 lbs SpO2 98%   See "scanned report" or Documentation Flowsheet (discrete results - PFTs) for Spirometry results. Patient provided good effort while attempting spirometry.   Albuterol Neb  Lot# [id number]     Exp. 02/2020  A/P:  Patient has been experiencing shortness of breath since her hospitalization in 2017.  Currently she reports taking albuterol 3 to 4 times a day.  Spirometry evaluation reveals possible restrictive/ near normal lung function.  Spirometry is unchanged since last evaluation in 2018.  Post nebulized albuterol tx revealed non-significant change in lung function.   -no change in therapy today   -We briefly discussed how her  breathing may improve post bariatric procedure she anticipates in the future. encouraged walking to improve lung conditioning/fitness.  -Reviewed results of pulmonary function tests.  Pt verbalized understanding of results and education.    History of tobacco abuse 1.5 ppd for > 30 years.  - Encouraged continued abstinence.   Written pt instructions provided.  F/U Clinic visit with PCP PRN in 2-3 months. .    Total time in face to face counseling 60 minutes.  Patient seen with Acie Fredrickson, PharmD Candidate and Pearla Dubonnet, Medical Student.

## 2018-09-10 ENCOUNTER — Encounter: Payer: Self-pay | Admitting: Gastroenterology

## 2018-09-14 NOTE — Progress Notes (Signed)
Patient ID: Frances Medina, female   DOB: [birth year], 58 y.o.   MRN: [id number] Reviewed: Agree with Dr. Graylin Shiver documentation and management.

## 2018-09-15 ENCOUNTER — Other Ambulatory Visit: Payer: Self-pay

## 2018-09-23 ENCOUNTER — Other Ambulatory Visit: Payer: Self-pay

## 2018-09-23 MED ORDER — ALBUTEROL SULFATE (2.5 MG/3ML) 0.083% IN NEBU: INHALATION_SOLUTION | RESPIRATORY_TRACT | 0 refills | Status: DC

## 2018-09-27 ENCOUNTER — Telehealth: Payer: Self-pay | Admitting: *Deleted

## 2018-09-27 ENCOUNTER — Other Ambulatory Visit: Payer: Self-pay | Admitting: Family Medicine

## 2018-09-27 MED ORDER — ALBUTEROL SULFATE HFA 108 (90 BASE) MCG/ACT IN AERS: 2.0000 | INHALATION_SPRAY | Freq: Four times a day (QID) | RESPIRATORY_TRACT | 3 refills | Status: DC | PRN

## 2018-09-27 NOTE — Telephone Encounter (Signed)
Refill complete 

## 2018-09-27 NOTE — Telephone Encounter (Signed)
Received refill request on proair.  Not on current med list.  Will forward to PCP. Christen Bame, CMA

## 2018-11-01 ENCOUNTER — Other Ambulatory Visit: Payer: Self-pay

## 2018-11-01 ENCOUNTER — Telehealth: Payer: Self-pay | Admitting: Family Medicine

## 2018-11-01 DIAGNOSIS — Z20822 Contact with and (suspected) exposure to covid-19: Secondary | ICD-10-CM

## 2018-11-01 DIAGNOSIS — Z20828 Contact with and (suspected) exposure to other viral communicable diseases: Secondary | ICD-10-CM

## 2018-11-01 NOTE — Telephone Encounter (Signed)
COVID Drive-Up Test Referral Criteria  Patient age: 58 y.o.  Symptoms: Shortness of breath, Muscle pain and Vomiting or diarrhea.. She has SOB at baseline and there is no change from her baseline. Muscle aches and diarrhea started 1 week ago.  Underlying Conditions: Severe obesity, OSA on CPAP, Restrictive lung disease, Mild Asthma, Myasthenia gravis  Is the patient a first responder? No  Does the patient live or work in a high risk or high density environment: No  Is the patient a COVID convalescent patient who is 14-28 days symptom-free and interested in donating plasma for use as a therapeutic product? No   Patient's husband tested COVID-19 positive.

## 2018-11-02 LAB — NOVEL CORONAVIRUS, NAA: SARS-CoV-2, NAA: NOT DETECTED

## 2018-11-03 ENCOUNTER — Telehealth: Payer: Self-pay | Admitting: Family Medicine

## 2018-11-03 NOTE — Telephone Encounter (Signed)
COVID-19 test negative discussed with her. Recommended flu shot. She will schedule flu clinic appointment.

## 2018-11-04 ENCOUNTER — Other Ambulatory Visit: Payer: Self-pay | Admitting: Family Medicine

## 2018-11-27 ENCOUNTER — Other Ambulatory Visit: Payer: Self-pay | Admitting: Nurse Practitioner

## 2018-11-29 ENCOUNTER — Ambulatory Visit (INDEPENDENT_AMBULATORY_CARE_PROVIDER_SITE_OTHER): Payer: BC Managed Care – PPO | Admitting: *Deleted

## 2018-11-29 ENCOUNTER — Other Ambulatory Visit: Payer: Self-pay

## 2018-11-29 DIAGNOSIS — Z23 Encounter for immunization: Secondary | ICD-10-CM

## 2018-12-19 ENCOUNTER — Other Ambulatory Visit: Payer: Self-pay | Admitting: Family Medicine

## 2018-12-19 DIAGNOSIS — R05 Cough: Secondary | ICD-10-CM

## 2018-12-19 DIAGNOSIS — R059 Cough, unspecified: Secondary | ICD-10-CM

## 2019-01-06 DIAGNOSIS — G4733 Obstructive sleep apnea (adult) (pediatric): Secondary | ICD-10-CM | POA: Diagnosis not present

## 2019-01-11 ENCOUNTER — Ambulatory Visit: Payer: Self-pay

## 2019-01-11 NOTE — Chronic Care Management (AMB) (Signed)
Care Management   Initial Visit Note  01/11/2019 Name: CIRCE IWAI MRN: [id number] DOB: February 01, 1961  Assessment: [name] is a 58 y.o. year old female who sees [name], MD for primary care. The care management team was consulted for assistance with care management and care coordination needs related to Disease Management Educational Needs.   Review of patient status, including review of consultants reports, relevant laboratory and other test results, and collaboration with appropriate care team members and the patient's provider was performed as part of comprehensive patient evaluation and provision of care management services.    SDOH (Social Determinants of Health) screening performed today: None. See Care Plan for related entries.   Advanced Directives: See Care Plan and Vynca application for related entries.   Outpatient Encounter Medications as of 01/11/2019  Medication Sig  . albuterol (PROVENTIL) (2.5 MG/3ML) 0.083% nebulizer solution USE 1 VIAL IN NEBULIZER EVERY 6 HOURS AS NEEDED FOR SHORTNESS OF BREATH AND WHEEZING  . albuterol (VENTOLIN HFA) 108 (90 Base) MCG/ACT inhaler Inhale 2 puffs into the lungs every 6 (six) hours as needed for wheezing or shortness of breath.  . benzonatate (TESSALON) 100 MG capsule TAKE 1 CAPSULE BY MOUTH TWICE A DAY AS NEEDED FOR COUGH  . cetirizine (ZYRTEC) 10 MG tablet TAKE 1 TABLET BY MOUTH EVERY DAY  . clonazePAM (KLONOPIN) 0.5 MG tablet TAKE 1 TABLET BY MOUTH TWICE A DAY AS NEEDED FOR ANXIETY  . docusate sodium (COLACE) 100 MG capsule Take 1 capsule (100 mg total) by mouth 2 (two) times daily as needed for mild constipation or moderate constipation.  . ferrous sulfate 325 (65 FE) MG tablet TAKE 1 TABLET BY MOUTH EVERY DAY WITH BREAKFAST  . fluticasone (FLONASE) 50 MCG/ACT nasal spray Place 2 sprays into both nostrils daily.  [name] guaiFENesin (ROBITUSSIN) 100 MG/5ML SOLN Take 15 mLs by mouth daily as needed for cough or to loosen  phlegm.  [name] LACTOBACILLUS PO Take 1 tablet by mouth.  . Menthol (COUGH DROPS) 10 MG LOZG Take 1 lozenge by mouth daily as needed (cough).  . montelukast (SINGULAIR) 10 MG tablet TAKE 1 TABLET BY MOUTH EVERYDAY AT BEDTIME  . mycophenolate (CELLCEPT) 500 MG tablet Take 1,000 mg by mouth 2 (two) times daily.  [name] nystatin cream (MYCOSTATIN) Apply 1 application topically 2 (two) times daily.  [name] omeprazole (PRILOSEC) 20 MG capsule TAKE 1 CAPSULE BY MOUTH EVERY MORNING. TAKE 30-60 MINUTES BEFORE BREAKFAST.  [name] sertraline (ZOLOFT) 50 MG tablet TAKE 3 TABLETS BY MOUTH EVERY DAY   No facility-administered encounter medications on file as of 01/11/2019.    "The patient agrees to case management services but states she does not wish to currently engage and feels her health conditions are currently well controlled.  Follow up plan:  The care management team will reach out to the patient again over the next 90 days.   Ms. Tiberi was given information about Care Management services today including:  1. Care Management services include personalized support from designated clinical staff supervised by a physician, including individualized plan of care and coordination with other care providers 2. 24/7 contact phone numbers for assistance for urgent and routine care needs. 3. The patient may stop Care Management services at any time (effective at the end of the month) by phone call to the office staff.  Patient agreed to services and verbal consent obtained.  [name] RN, BSN, [facility] Care Management Coordinator [place] Phone: [phone]  Office: [phone]  Fax: [phone]

## 2019-01-11 NOTE — Chronic Care Management (AMB) (Deleted)
Visit Information The patient was contacted as a new referral for Myasthenia Gravis, OSA, Depression and Anxiety.  She was notified of the care management program at [facility].  She stated that she is interested but at this time has no needs.  She states "you never know about the future."  Goals Addressed   None     The patient verbalized understanding of instructions provided today and declined a print copy of patient instruction materials.   The patient has been provided with contact information for the care management team and has been advised to call with any health related questions or concerns.   [name] RN, BSN, [facility] Care Management Coordinator [place] Phone: [phone]  Office: [phone] Fax: [phone]

## 2019-01-12 NOTE — Patient Instructions (Signed)
Visit Information  Mrs. Frances Medina I have enjoyed speaking with you today.  Please feel free to call me with any questions that you may have.  I will out reach you again in January.    [name] was given information about Care Management services today including:  1. Care Management services include personalized support from designated clinical staff supervised by her physician, including individualized plan of care and coordination with other care providers 2. 24/7 contact phone numbers for assistance for urgent and routine care needs. 3. The patient may stop CCM services at any time (effective at the end of the month) by phone call to the office staff.  Patient agreed to services and verbal consent obtained.   The patient verbalized understanding of instructions provided today and declined a print copy of patient instruction materials.   The care management team will reach out to the patient again over the next 90 days.   [name] RN, BSN, [facility] Care Management Coordinator [place] Phone: [phone]  Office: [phone] Fax: [phone]

## 2019-02-03 DIAGNOSIS — G7000 Myasthenia gravis without (acute) exacerbation: Secondary | ICD-10-CM | POA: Diagnosis not present

## 2019-02-10 ENCOUNTER — Other Ambulatory Visit: Payer: Self-pay | Admitting: Family Medicine

## 2019-02-17 ENCOUNTER — Other Ambulatory Visit: Payer: Self-pay | Admitting: Family Medicine

## 2019-02-18 ENCOUNTER — Other Ambulatory Visit: Payer: Self-pay | Admitting: Family Medicine

## 2019-02-18 ENCOUNTER — Telehealth (INDEPENDENT_AMBULATORY_CARE_PROVIDER_SITE_OTHER): Payer: BC Managed Care – PPO | Admitting: Family Medicine

## 2019-02-18 ENCOUNTER — Encounter: Payer: Self-pay | Admitting: Family Medicine

## 2019-02-18 ENCOUNTER — Other Ambulatory Visit: Payer: Self-pay

## 2019-02-18 DIAGNOSIS — J302 Other seasonal allergic rhinitis: Secondary | ICD-10-CM | POA: Insufficient documentation

## 2019-02-18 DIAGNOSIS — R519 Headache, unspecified: Secondary | ICD-10-CM

## 2019-02-18 DIAGNOSIS — G4733 Obstructive sleep apnea (adult) (pediatric): Secondary | ICD-10-CM

## 2019-02-18 DIAGNOSIS — Z1231 Encounter for screening mammogram for malignant neoplasm of breast: Secondary | ICD-10-CM

## 2019-02-18 DIAGNOSIS — Z9989 Dependence on other enabling machines and devices: Secondary | ICD-10-CM | POA: Diagnosis not present

## 2019-02-18 DIAGNOSIS — G43809 Other migraine, not intractable, without status migrainosus: Secondary | ICD-10-CM

## 2019-02-18 MED ORDER — MONTELUKAST SODIUM 10 MG PO TABS: 10.0000 mg | ORAL_TABLET | Freq: Every day | ORAL | 3 refills | Status: DC

## 2019-02-18 MED ORDER — NAPROXEN 500 MG PO TABS: 500.0000 mg | ORAL_TABLET | Freq: Two times a day (BID) | ORAL | 0 refills | Status: DC | PRN

## 2019-02-18 NOTE — Patient Instructions (Signed)
Form - Headache Record There are many types and causes of headaches. A headache record can help guide your treatment plan. Use this form to record the details. Bring this form with you to your follow-up visits. Follow your health care provider's instructions on how to describe your headache. You may be asked to:  Use a pain scale. This is a tool to rate the intensity of your headache using words or numbers.  Describe what your headache feels like, such as dull, achy, throbbing, or sharp. Headache record Date: _______________ Time (from start to end): ____________________ Location of the headache: _________________________  Intensity of the headache: ____________________ Description of the headache: ______________________________________________________________  Hours of sleep the night before the headache: __________  Food or drinks before the headache started: ______________________________________________________________________________________  Events before the headache started: _______________________________________________________________________________________________  Symptoms before the headache started: __________________________________________________________________________________________  Symptoms during the headache: __________________________________________________________________________________________________  Treatment: ________________________________________________________________________________________________________________  Effect of treatment: _________________________________________________________________________________________________________  Other comments: ___________________________________________________________________________________________________________ Date: _______________ Time (from start to end): ____________________ Location of the headache: _________________________  Intensity of the headache: ____________________ Description of the  headache: ______________________________________________________________  Hours of sleep the night before the headache: __________  Food or drinks before the headache started: ______________________________________________________________________________________  Events before the headache started: ____________________________________________________________________________________________  Symptoms before the headache started: _________________________________________________________________________________________  Symptoms during the headache: _______________________________________________________________________________________________  Treatment: ________________________________________________________________________________________________________________  Effect of treatment: _________________________________________________________________________________________________________  Other comments: ___________________________________________________________________________________________________________ Date: _______________ Time (from start to end): ____________________ Location of the headache: _________________________  Intensity of the headache: ____________________ Description of the headache: ______________________________________________________________  Hours of sleep the night before the headache: __________  Food or drinks before the headache started: ______________________________________________________________________________________  Events before the headache started: ____________________________________________________________________________________________  Symptoms before the headache started: _________________________________________________________________________________________  Symptoms during the headache: _______________________________________________________________________________________________  Treatment:  ________________________________________________________________________________________________________________  Effect of treatment: _________________________________________________________________________________________________________  Other comments: ___________________________________________________________________________________________________________ Date: _______________ Time (from start to end): ____________________ Location of the headache: _________________________  Intensity of the headache: ____________________ Description of the headache: ______________________________________________________________  Hours of sleep the night before the headache: _________  Food or drinks before the headache started: ______________________________________________________________________________________  Events before the headache started: ____________________________________________________________________________________________  Symptoms before the headache started: _________________________________________________________________________________________  Symptoms during the headache: _______________________________________________________________________________________________  Treatment: ________________________________________________________________________________________________________________  Effect of treatment: _________________________________________________________________________________________________________  Other comments: ___________________________________________________________________________________________________________ Date: _______________ Time (from start to end): ____________________ Location of the headache: _________________________  Intensity of the headache: ____________________ Description of the headache: ______________________________________________________________  Hours of sleep the night before the headache: _________  Food or drinks  before the headache started: ______________________________________________________________________________________  Events before the headache started: ____________________________________________________________________________________________  Symptoms before the headache started: _________________________________________________________________________________________  Symptoms during the headache: _______________________________________________________________________________________________  Treatment: ________________________________________________________________________________________________________________  Effect of treatment: _________________________________________________________________________________________________________  Other comments: ___________________________________________________________________________________________________________ This information is not intended to replace advice given to you by your health care provider. Make sure you discuss any questions you have with your health care provider. Document Released: 03/08/2018 Document Revised: 03/08/2018 Document Reviewed: 03/08/2018 Elsevier Patient Education  [place].

## 2019-02-18 NOTE — Assessment & Plan Note (Signed)
??   Migraine headache. May also be medication induced I.e Zoloft and Cellcept. Her CPAP machine also need titration. This could be contributing to her headache. ?? Need of eye glasses adjustment, but less likely the cause. Given mild change in vision, I recommended that she contacts her ophthalmologist ASAP for reeval. Based on minimal exam on video visit, no obvious neurologic deficit. She also recently say her neurologist for headache who recommended PCP follow-up. We will hold up on obtaining imaging for now. Try Naproxen as needed for headache. ED precaution discussed. Keep headache diary and f/u in 2-3 weeks. Referral again to sleep clinic for CPAP titration. F/U as needed.

## 2019-02-18 NOTE — Assessment & Plan Note (Signed)
Add Singulair to regimen. Avoid triggers.

## 2019-02-18 NOTE — Progress Notes (Signed)
[facility] Telemedicine Visit  Patient consented to have virtual visit. Method of visit: Video  Encounter participants: Patient: Frances Medina - located at Home Provider: Andrena Mews - located at Office Others (if applicable): N/A  Chief Complaint: Headache  HPI:  Headache  This is a new problem. Episode onset: Started about 1 month. The problem occurs daily. The problem has been gradually worsening. The pain is located in the left unilateral region. The pain radiates to the left neck. The pain quality is similar to prior headaches (Had migrain in the past but more constant). The quality of the pain is described as squeezing and sharp. The pain is at a severity of 0/10 (at times pain is about 7/10 in severity). The pain is moderate. Associated symptoms include anorexia, coughing and sinus pressure. Pertinent negatives include no ear pain, eye pain, eye redness, fever, nausea, phonophobia, photophobia, scalp tenderness, sore throat, vomiting or weakness. Associated symptoms comments: A little blurry vision. Last saw her ophthalmologist earlier this year. The symptoms are aggravated by emotional stress. She has tried NSAIDs for the symptoms. The treatment provided moderate (Ibuprofen makes it go away, and then it comes back) relief. Her past medical history is significant for migraine headaches.  Sinusitis This is a chronic problem. The problem has been waxing and waning since onset. There has been no fever. Associated symptoms include congestion, coughing, headaches and sinus pressure. Pertinent negatives include no ear pain, sneezing or sore throat. (Rhinorrhea. Whenever she goes outside she comes back in coughing a lot and sneezing) Treatments tried: Flonase and Zyrtec. The treatment provided moderate relief.     Review of Systems  Constitutional: Negative for fever.  HENT: Positive for congestion and sinus pressure. Negative for ear pain, sneezing and sore  throat.   Eyes: Negative for photophobia, pain and redness.  Respiratory: Positive for cough.   Gastrointestinal: Positive for anorexia. Negative for nausea and vomiting.  Neurological: Positive for headaches. Negative for weakness.       No numbness     Pertinent PMHx: Problem list reviewed  Exam:  Physical Exam Constitutional:      Appearance: She is obese.  HENT:     Head: Normocephalic and atraumatic.     Comments: No facial asymmetry  Pulmonary:     Effort: Pulmonary effort is normal. No respiratory distress.  Musculoskeletal:     Comments: Could move upper extremities without difficulty  Neurological:     Mental Status: She is alert and oriented to person, place, and time.     Comments: No slurring of her speech  Psychiatric:        Mood and Affect: Mood normal.        Behavior: Behavior normal.     Assessment/Plan:  Headache, migraine ?? Migraine headache. May also be medication induced I.e Zoloft and Cellcept. Her CPAP machine also need titration. This could be contributing to her headache. ?? Need of eye glasses adjustment, but less likely the cause. Given mild change in vision, I recommended that she contacts her ophthalmologist ASAP for reeval. Based on minimal exam on video visit, no obvious neurologic deficit. She also recently say her neurologist for headache who recommended PCP follow-up. We will hold up on obtaining imaging for now. Try Naproxen as needed for headache. ED precaution discussed. Keep headache diary and f/u in 2-3 weeks. Referral again to sleep clinic for CPAP titration. F/U as needed.  Seasonal allergies Continue current regimen. Avoid triggers. NB: Although she is  uncertain if on Singulair, it is on her med list. Med refilled. Watch for improvement.  Time spent during visit with patient: 30 minutes  NB: Due for PAP and Mammogram.  Mammogram ordered. Schedule f/u appointment for PAP.

## 2019-03-01 ENCOUNTER — Telehealth: Payer: Self-pay | Admitting: *Deleted

## 2019-03-01 NOTE — Telephone Encounter (Signed)
Patient made an appt for 03-07-2019, you didn't have any availability until February.  Amabel Stmarie,CMA

## 2019-03-01 NOTE — Telephone Encounter (Signed)
Please help patient schedule an inperson appointment with me or earliest available in the next few days.  ED precaution if symptoms worsens.

## 2019-03-01 NOTE — Telephone Encounter (Signed)
Patient called and states that she stopped the naproxen last week due to experiencing SOB when taking it.  She also said that her headaches will last about 15 mins and will be sharp in the beginning and then will fade to dull.  It is all happening on the left side over her eye.  States that she is currently taking ibuprofen with mild relief.  Layah Skousen,CMA

## 2019-03-07 ENCOUNTER — Telehealth (INDEPENDENT_AMBULATORY_CARE_PROVIDER_SITE_OTHER): Payer: BC Managed Care – PPO | Admitting: Family Medicine

## 2019-03-07 ENCOUNTER — Ambulatory Visit: Payer: BC Managed Care – PPO

## 2019-03-07 ENCOUNTER — Other Ambulatory Visit: Payer: Self-pay

## 2019-03-07 ENCOUNTER — Encounter: Payer: Self-pay | Admitting: Family Medicine

## 2019-03-07 DIAGNOSIS — R519 Headache, unspecified: Secondary | ICD-10-CM | POA: Diagnosis not present

## 2019-03-07 DIAGNOSIS — G43809 Other migraine, not intractable, without status migrainosus: Secondary | ICD-10-CM | POA: Diagnosis not present

## 2019-03-07 MED ORDER — MELOXICAM 15 MG PO TABS: 15.0000 mg | ORAL_TABLET | Freq: Every day | ORAL | 2 refills | Status: DC

## 2019-03-07 NOTE — Progress Notes (Signed)
  [facility] Telemedicine Visit  Patient consented to have virtual visit. Method of visit: Telephone  Encounter participants: Patient: Frances Medina - located at home address for patient  Provider: Stark Klein - located at home address for provider    Chief Complaint: daily HA  HPI:  Patient states she has experienced daily HAs for a few months now. She previously thought the HA were due to her sinuses or her MG but is unsure as to why they occur so frequently. She describes the Has as beginning on the left side of her head and in her left eye. She usually has 15 minutes of intense sharp pain and then the pain becomes a dull ache, severity is rated as 10/10. She reports some intermittent nausea and emesis during HA episodes and states that her appetite has been decreased for a while. The pain often keeps her awake at night.  She also endorses vision changes that occur at variable times. Patient reports bilaterally tearing and rhinorrhea with her headaches. She states that she has been able to take naproxen, ibuprofen with some relief however she feels SOB when taking naproxen.  Takes ibuprofen every 4-6 hours, last took medication this morning, usually takes 1000 mg in the AM, 1000mg  more throughout the day and then another 1000mg  to try and get some sleep at night. Patient denies numbness or inability to move limbs due to HA.    ROS: per HPI  Pertinent PMHx: Hx of Migraine Headaches, MG   Exam:  Respiratory: speaking in full sentences, with no signs of respiratory distress   Assessment/Plan:  Headache, migraine This is most likely migraine headache given patient's long history of frequent HA. Reported history of lacrimation would cause one to consider cluster headache and history of psychological stressors could contribute to tension HA as well.  -Prescribed Meloxicam once per day 15mg  with food  -Referral to headache clinic  -Referral to Chronic care  management -counseled patietn on medication overuse HA and concern for heavy use of NSAIDS could contribute to GI ulcers, patient verbalized understanding      Time spent during visit with patient: 25 minutes

## 2019-03-09 ENCOUNTER — Ambulatory Visit: Payer: Self-pay | Admitting: Licensed Clinical Social Worker

## 2019-03-09 NOTE — Chronic Care Management (AMB) (Signed)
    Clinical Social Work  Care Management Outreach Note  03/09/2019 Name: Frances Medina MRN: [id number] DOB: 1960-08-16  Frances Medina is a 60 y.o. year old female who is a primary care patient of Frances Medina, Frances Myron, MD . LCSW was consulted for assistance with helping patient with coping skills and managing stressors. LCSW reached out to Frances Medina today by phone to assess needs and provide interventions.  Assessment: Patient reports she does not needs any assistance and is not interested in counseling or support.  States she talks to her son, Frances Medina and utilizes prayer to cope with stress. Interventions:   1.. Reminded patient she has a F/U call scheduled 03/16/19 with RN care Medina Traci. 2. Discussed advance directives with patient, she was not interested  Follow Up Plan: RN care Medina will F/U with patient next week. No F/U needed by LCSW at this time.  Casimer Lanius, LCSW Clinical Social Worker [place] / [place]   [phone] 12:42 PM   . .

## 2019-03-09 NOTE — Assessment & Plan Note (Signed)
This is most likely migraine headache given patient's long history of frequent HA. Reported history of lacrimation would cause one to consider cluster headache and history of psychological stressors could contribute to tension HA as well.  -Prescribed Meloxicam once per day 15mg  with food  -Referral to headache clinic  -Referral to Chronic care management -counseled patietn on medication overuse HA and concern for heavy use of NSAIDS could contribute to GI ulcers, patient verbalized understanding

## 2019-03-13 ENCOUNTER — Other Ambulatory Visit: Payer: Self-pay | Admitting: Nurse Practitioner

## 2019-03-16 ENCOUNTER — Other Ambulatory Visit: Payer: Self-pay

## 2019-03-16 ENCOUNTER — Ambulatory Visit: Payer: BC Managed Care – PPO

## 2019-03-16 NOTE — Chronic Care Management (AMB) (Signed)
  Care Management   Outreach Note  03/16/2019 Name: Frances Medina MRN: [id number] DOB: 1960-08-11  Referred by: [name], MD Reason for referral : Care Coordination (Care Management RNCM 90 day F/U)   An unsuccessful telephone outreach was attempted today. The patient was referred to the case management team by for assistance with care management and care coordination.   Follow Up Plan: A HIPPA compliant phone message was left for the patient providing contact information and requesting a return call.  The care management team will reach out to the patient again over the next 7 days.   [name] RN, BSN, [facility] Care Management Coordinator [place] Phone: [phone] Fax: [phone]

## 2019-03-22 ENCOUNTER — Other Ambulatory Visit: Payer: Self-pay

## 2019-03-22 ENCOUNTER — Ambulatory Visit: Payer: BC Managed Care – PPO

## 2019-03-22 ENCOUNTER — Ambulatory Visit: Payer: Self-pay

## 2019-03-22 NOTE — Patient Instructions (Signed)
Visit Information  Goals Addressed            This Visit's Progress   . " I want to lose weight to help me breath better" (pt-stated)       Current Barriers:  [name] Knowledge Deficits related to weight loss strategies as evidenced by weight gain reported weight 320 lbs. . Chronic Disease Management support and education needs related to weight management in patient with  OSA/Restrictive Lung Diease Nurse Case Manager Clinical Goal(s):  [name] Over the next 30 days, patient will verbalize basic understanding of plan for initiation of weight loss strategies . Over the next 90 days, patient will demonstrate improved health management independence as evidenced by keeping daily food log/diary and increasing daily water intake (patient does not have provider prescribed fluid restriction) . Over the next 30 days, patient will work with RN Case Manager to address needs related to any barriers that she may have related to weight loss  Interventions: 03/22/19 . Evaluation of current treatment plan related to weight loss and patient's adherence to plan as established by provider . Provided verbal and/or written education to patient re: recommended life style changes: avoid fad diets, make small/incremental dietary and light exercise changes( IE.., walk around her kitchen table, sit in a chair and exercise her arms with  small water bottles for a few minutes a day.), eat at the table and avoid eating in front of the TV, plan management of cravings, monitor snacking and cravings in food diary . Discussed plans with patient for ongoing care management follow up and provided patient with direct contact information for care management team . Reduces sodium in her diet, discussed the effects of sodium on the body. . Monitor fried foods . Weigh monthly . Be realistic on weight to lose.  Patient states 1-2 lbs to start.  Patient Self Care Activities:  . Performs ADL's independently . Calls provider office for new  concerns or questions . Unable to independently self manage weight loss  Initial goal documentation        Ms. Runion was given information about Care Management services today including:  1. Care Management services include personalized support from designated clinical staff supervised by her physician, including individualized plan of care and coordination with other care providers 2. 24/7 contact phone numbers for assistance for urgent and routine care needs. 3. The patient may stop CCM services at any time (effective at the end of the month) by phone call to the office staff.  Patient agreed to services and verbal consent obtained.   The patient verbalized understanding of instructions provided today and declined a print copy of patient instruction materials.   The care management team will reach out to the patient again over the next 14 days.  The patient has been provided with contact information for the care management team and has been advised to call with any health related questions or concerns.   [name] RN, BSN, [facility] Care Management Coordinator [place] Phone: [phone] Fax: [phone]

## 2019-03-22 NOTE — Chronic Care Management (AMB) (Signed)
  Care Management   Outreach Note  03/22/2019 Name: Frances Medina MRN: [id number] DOB: 26-Apr-1960  Referred by: [name], MD Reason for referral : Care Coordination (Care Management RNCM Restrictive lung diease /OSA)   A second unsuccessful telephone outreach was attempted today. The patient was referred to the case management team for assistance with care management and care coordination.   Follow Up Plan: A HIPPA compliant phone message was left for the patient providing contact information and requesting a return call.  The care management team will reach out to the patient again over the next 14 days.   [name] RN, BSN, [facility] Care Management Coordinator [place] Phone: [phone] Fax: [phone]

## 2019-03-22 NOTE — Chronic Care Management (AMB) (Signed)
Care Management   Initial Visit Note  03/22/2019 Name: Frances Medina MRN: [id number] DOB: 07-19-1960  Assessment: Frances Medina is a 59 y.o. year old female who sees [name], MD for primary care. The care management team was consulted for assistance with care management and care coordination needs related to Disease Management Educational Needs Restrictive lung Diease/ OSA.   Review of patient status, including review of consultants reports, relevant laboratory and other test results, and collaboration with appropriate care team members and the patient's provider was performed as part of comprehensive patient evaluation and provision of care management services.    SDOH (Social Determinants of Health) screening performed today: None. See Care Plan for related entries.    Outpatient Encounter Medications as of 03/22/2019  Medication Sig  . albuterol (PROVENTIL) (2.5 MG/3ML) 0.083% nebulizer solution USE 1 VIAL IN NEBULIZER EVERY 6 HOURS AS NEEDED FOR SHORTNESS OF BREATH AND WHEEZING  . albuterol (VENTOLIN HFA) 108 (90 Base) MCG/ACT inhaler Inhale 2 puffs into the lungs every 6 (six) hours as needed for wheezing or shortness of breath.  . benzonatate (TESSALON) 100 MG capsule TAKE 1 CAPSULE BY MOUTH TWICE A DAY AS NEEDED FOR COUGH  . cetirizine (ZYRTEC) 10 MG tablet TAKE 1 TABLET BY MOUTH EVERY DAY  . clonazePAM (KLONOPIN) 0.5 MG tablet TAKE 1 TABLET BY MOUTH TWICE A DAY AS NEEDED FOR ANXIETY  . docusate sodium (COLACE) 100 MG capsule Take 1 capsule (100 mg total) by mouth 2 (two) times daily as needed for mild constipation or moderate constipation.  . ferrous sulfate 325 (65 FE) MG tablet TAKE 1 TABLET BY MOUTH EVERY DAY WITH BREAKFAST  . fluticasone (FLONASE) 50 MCG/ACT nasal spray Place 2 sprays into both nostrils daily.  [name] guaiFENesin (ROBITUSSIN) 100 MG/5ML SOLN Take 15 mLs by mouth daily as needed for cough or to loosen phlegm.  [name] LACTOBACILLUS PO Take 1 tablet by mouth.  .  meloxicam (MOBIC) 15 MG tablet Take 1 tablet (15 mg total) by mouth daily.  . Menthol (COUGH DROPS) 10 MG LOZG Take 1 lozenge by mouth daily as needed (cough).  . montelukast (SINGULAIR) 10 MG tablet Take 1 tablet (10 mg total) by mouth at bedtime.  . mycophenolate (CELLCEPT) 500 MG tablet Take 1,000 mg by mouth 2 (two) times daily.  [name] omeprazole (PRILOSEC) 20 MG capsule TAKE 1 CAPSULE BY MOUTH EVERY MORNING. TAKE 30-60 MINUTES BEFORE BREAKFAST.  [name] sertraline (ZOLOFT) 50 MG tablet TAKE 3 TABLETS BY MOUTH EVERY DAY   No facility-administered encounter medications on file as of 03/22/2019.    Goals Addressed            This Visit's Progress   . " I want to lose weight to help me breath better" (pt-stated)       Current Barriers:  [name] Knowledge Deficits related to weight loss strategies as evidenced by weight gain reported weight 320 lbs. . Chronic Disease Management support and education needs related to weight management in patient with  OSA/Restrictive Lung Diease Nurse Case Manager Clinical Goal(s):  [name] Over the next 30 days, patient will verbalize basic understanding of plan for initiation of weight loss strategies . Over the next 90 days, patient will demonstrate improved health management independence as evidenced by keeping daily food log/diary and increasing daily water intake (patient does not have provider prescribed fluid restriction) . Over the next 30 days, patient will work with RN Case Manager to address needs related to any barriers that  she may have related to weight loss  Interventions: 03/22/19 . Evaluation of current treatment plan related to weight loss and patient's adherence to plan as established by provider . Provided verbal and/or written education to patient re: recommended life style changes: avoid fad diets, make small/incremental dietary and light exercise changes( IE.., walk around her kitchen table, sit in a chair and exercise her arms with  small water bottles for  a few minutes a day.), eat at the table and avoid eating in front of the TV, plan management of cravings, monitor snacking and cravings in food diary . Discussed plans with patient for ongoing care management follow up and provided patient with direct contact information for care management team . Reduces sodium in her diet, discussed the effects of sodium on the body. . Monitor fried foods . Weigh monthly . Be realistic on weight to lose.  Patient states 1-2 lbs to start.  Patient Self Care Activities:  . Performs ADL's independently . Calls provider office for new concerns or questions . Unable to independently self manage weight loss  Initial goal documentation        Wt Readings from Last 3 Encounters:  09/09/18 (!) 320 lb 3.2 oz (145.2 kg)  08/06/18 (!) 321 lb 12.8 oz (146 kg)  03/23/18 (!) 315 lb 8 oz (143.1 kg)     Follow up plan:  The care management team will reach out to the patient again over the next 14 days.  The patient has been provided with contact information for the care management team and has been advised to call with any health related questions or concerns.   [name] was given information about Care Management services today including:  1. Care Management services include personalized support from designated clinical staff supervised by a physician, including individualized plan of care and coordination with other care providers 2. 24/7 contact phone numbers for assistance for urgent and routine care needs. 3. The patient may stop Care Management services at any time (effective at the end of the month) by phone call to the office staff.  Patient agreed to services and verbal consent obtained.  [name] RN, BSN, [facility] Care Management Coordinator [place] Phone: [phone] Fax: [phone]

## 2019-03-22 NOTE — Patient Instructions (Signed)
Visit Information  Goals Addressed   None     Ms. Westling was given information about Care Management services today including:  1. Care Management services include personalized support from designated clinical staff supervised by her physician, including individualized plan of care and coordination with other care providers 2. 24/7 contact phone numbers for assistance for urgent and routine care needs. 3. The patient may stop CCM services at any time (effective at the end of the month) by phone call to the office staff.  Patient agreed to services and verbal consent obtained.   The patient verbalized understanding of instructions provided today and declined a print copy of patient instruction materials.   A HIPPA compliant phone message was left for the patient providing contact information and requesting a return call.  The care management team will reach out to the patient again over the next 14 days.   [name] RN, BSN, [facility] Care Management Coordinator [place] Phone: [phone] Fax: [phone]

## 2019-03-31 ENCOUNTER — Telehealth: Payer: BC Managed Care – PPO

## 2019-04-04 ENCOUNTER — Encounter: Payer: Self-pay | Admitting: Internal Medicine

## 2019-04-04 ENCOUNTER — Other Ambulatory Visit: Payer: Self-pay

## 2019-04-04 ENCOUNTER — Ambulatory Visit (INDEPENDENT_AMBULATORY_CARE_PROVIDER_SITE_OTHER): Payer: BC Managed Care – PPO | Admitting: Internal Medicine

## 2019-04-04 VITALS — BP 132/78 | HR 84 | Temp 97.2°F | Ht 60.0 in | Wt 329.4 lb

## 2019-04-04 DIAGNOSIS — R1013 Epigastric pain: Secondary | ICD-10-CM

## 2019-04-04 DIAGNOSIS — I10 Essential (primary) hypertension: Secondary | ICD-10-CM

## 2019-04-04 DIAGNOSIS — R079 Chest pain, unspecified: Secondary | ICD-10-CM

## 2019-04-04 DIAGNOSIS — R0602 Shortness of breath: Secondary | ICD-10-CM

## 2019-04-04 NOTE — Patient Instructions (Signed)
Medication Instructions:  STOP TAKING THE IBUPROFEN DAILY   CONTINUE ALL OTHER MEDICATIONS *If you need a refill on your cardiac medications before your next appointment, please call your pharmacy*  Lab Work: NOT NEEDED  Testing/Procedures: WILL BE SCHEDULE AT [place] [address] Your physician has requested that you have a lexiscan myoview. Please follow instruction sheet, as given.    Follow-Up: At [facility], you and your health needs are our priority.  As part of our continuing mission to provide you with exceptional heart care, we have created designated Provider Care Teams.  These Care Teams include your primary Cardiologist (physician) and Advanced Practice Providers (APPs -  Physician Assistants and Nurse Practitioners) who all work together to provide you with the care you need, when you need it.  Your next appointment:   4 week(s)  The format for your next appointment:   In Person  Provider:   Cherlynn Kaiser, MD  Other Instructions n/a

## 2019-04-04 NOTE — Progress Notes (Signed)
Cardiology Office Note:    Date:  04/04/2019   ID:  Frances Medina, DOB [birth year], MRN [id number]  PCP:  [name], MD  Cardiologist:  No primary care provider on file.  Electrophysiologist:  None   Referring MD: [name], MD   Chief Complaint: Follow-up; epigastric pain  History of Present Illness:    Frances Medina is a 59 y.o. female with a hx of myasthenia gravis on mycophenolate therapy, morbid obesity, obstructive sleep apnea, restrictive lung disease by pulmonary function testing likely secondary to obesity, andanxiety.  She presents today for annual follow-up.  She was last seen by me in December 2019 for preoperative evaluation for bariatric surgery.  She has not yet completed this surgery due to the COVID-19 pandemic.  She hopes to continue this evaluation.  She has a history of myasthenia gravis with previous respiratory failure and tracheostomy placement.  She successfully had tracheostomy removal.  Review of systems today, the patient describes a bandlike subcostal and epigastric chest pain can occur with and without eating.  She says it feels like muscle spasms.  She also has a sense of early satiety.  So that she can eat without prandial pain, she takes an 81 mg aspirin to ease off the pain.  She also has a history of chronic headaches and takes 5 ibuprofen 3-4 times a day and has been doing so for years.  She has a history of chronic dyspnea and also has OSA with CPAP.  Her chest pain is not worsened by exertion and not worsened by deep breathing.  Past Medical History:  Diagnosis Date  . Acute respiratory failure ([place])   . Acute respiratory failure with hypoxemia ([place])   . Aspiration into airway   . CAP (community acquired pneumonia) 08/05/2015  . Depression with anxiety   . Diverticulosis   . Dysphagia 03/2015    EGD, Dr Carlean Purl. mild antral gastritis, ? due to Ibuprofen.  no stricture but empirically maloney dilated esophagus.   . E. coli UTI  04/07/2015  . Elevated LDH 05/06/2015  . Encounter for routine gynecological examination 10/19/2015  . Endotracheally intubated   . Enteritis due to Clostridium difficile   . Fibroid uterus    size of a dime  . Gastrostomy infection ([place]) 11/08/2015  . Gastrostomy infection ([place]) 11/08/2015  . GERD (gastroesophageal reflux disease)    hiatal hernia  . Hypertension    "went away when I stopped smoking"  . Knee injury   . Left hip pain 02/18/2017  . Migraine    "maybe couple times/month" (03/20/2015)  . Mild intermittent asthma 06/22/2017  . Myasthenia gravis ([place]) 2017  . Myasthenia gravis with acute exacerbation ([place]) 05/15/2015  . Osteoarthritis of left knee 12/02/2013  . Osteoarthritis of right knee 08/30/2013  . Pancreatitis 07/25/2017  . Protein-calorie malnutrition, severe ([place]) 08/07/2015  . Seasonal allergies    takes Zytrec  . Tracheostomy status ([place])   . Tubular adenoma of colon   . Tumors    "in my stomach"  . Umbilical hernia    watching , no plans for surgery at present  . VAP (ventilator-associated pneumonia) [facility])     Past Surgical History:  Procedure Laterality Date  . CESAREAN SECTION  1987; 1989  . COLONOSCOPY WITH PROPOFOL N/A 09/19/2013   Procedure: COLONOSCOPY WITH PROPOFOL;  Surgeon: Ladene Artist, MD;  Location: [facility] END[facility]COPY;  Service: Endoscopy;  Laterality: N/A;  . DILATION AND CURETTAGE OF UTERUS    .  ESOPHAGOGASTRODUODENOSCOPY N/A 08/10/2015   Procedure: ESOPHAGOGASTRODUODENOSCOPY (EGD);  Surgeon: Gatha Mayer, MD;  Location: [facility] ENDOSCOPY;  Service: Endoscopy;  Laterality: N/A;  . ESOPHAGOGASTRODUODENOSCOPY (EGD) WITH PROPOFOL N/A 03/21/2015   Procedure: ESOPHAGOGASTRODUODENOSCOPY (EGD) WITH PROPOFOL;  Surgeon: Gatha Mayer, MD;  Location: [facility] [place];  Service: Endoscopy;  Laterality: N/A;  . PARTIAL KNEE ARTHROPLASTY Right 08/30/2013   Procedure: RIGHT UNICOMPARTMENTAL KNEE;  Surgeon: Johnny Bridge, MD;  Location: [place];  Service: Orthopedics;   Laterality: Right;  . PARTIAL KNEE ARTHROPLASTY Left 12/02/2013   Procedure: LEFT KNEE UNI ARTHROPLASTY;  Surgeon: Johnny Bridge, MD;  Location: [place];  Service: Orthopedics;  Laterality: Left;  . PEG PLACEMENT N/A 08/10/2015   Procedure: PERCUTANEOUS ENDOSCOPIC GASTROSTOMY (PEG) PLACEMENT;  Surgeon: Gatha Mayer, MD;  Location: [place];  Service: Endoscopy;  Laterality: N/A;  . TUBAL LIGATION  1989  . VAGINAL HYSTERECTOMY  1990's?   "apparently took out one of my ovaries at the time too cause one's missing"    Current Medications: Current Meds  Medication Sig  . albuterol (PROVENTIL) (2.5 MG/3ML) 0.083% nebulizer solution USE 1 VIAL IN NEBULIZER EVERY 6 HOURS AS NEEDED FOR SHORTNESS OF BREATH AND WHEEZING  . albuterol (VENTOLIN HFA) 108 (90 Base) MCG/ACT inhaler Inhale 2 puffs into the lungs every 6 (six) hours as needed for wheezing or shortness of breath.  . benzonatate (TESSALON) 100 MG capsule TAKE 1 CAPSULE BY MOUTH TWICE A DAY AS NEEDED FOR COUGH  . cetirizine (ZYRTEC) 10 MG tablet TAKE 1 TABLET BY MOUTH EVERY DAY  . clonazePAM (KLONOPIN) 0.5 MG tablet TAKE 1 TABLET BY MOUTH TWICE A DAY AS NEEDED FOR ANXIETY  . docusate sodium (COLACE) 100 MG capsule Take 1 capsule (100 mg total) by mouth 2 (two) times daily as needed for mild constipation or moderate constipation.  . ferrous sulfate 325 (65 FE) MG tablet TAKE 1 TABLET BY MOUTH EVERY DAY WITH BREAKFAST  . fluticasone (FLONASE) 50 MCG/ACT nasal spray Place 2 sprays into both nostrils daily.  [name] guaiFENesin (ROBITUSSIN) 100 MG/5ML SOLN Take 15 mLs by mouth daily as needed for cough or to loosen phlegm.  [name] LACTOBACILLUS PO Take 1 tablet by mouth.  . meloxicam (MOBIC) 15 MG tablet Take 1 tablet (15 mg total) by mouth daily.  . Menthol (COUGH DROPS) 10 MG LOZG Take 1 lozenge by mouth daily as needed (cough).  . montelukast (SINGULAIR) 10 MG tablet Take 1 tablet (10 mg total) by mouth at bedtime.  . mycophenolate  (CELLCEPT) 500 MG tablet Take 1,000 mg by mouth 2 (two) times daily.  [name] omeprazole (PRILOSEC) 20 MG capsule TAKE 1 CAPSULE BY MOUTH EVERY MORNING. TAKE 30-60 MINUTES BEFORE BREAKFAST.  [name] sertraline (ZOLOFT) 50 MG tablet TAKE 3 TABLETS BY MOUTH EVERY DAY     Allergies:   Ciprofloxacin, Naproxen, Shrimp [shellfish allergy], Dicyclomine, Methocarbamol, Penicillins, and Morphine and related   Social History   Socioeconomic History  . Marital status: Married    Spouse name: Not on file  . Number of children: 3  . Years of education: Not on file  . Highest education level: Not on file  Occupational History  . Occupation: disabled  Tobacco Use  . Smoking status: Former Smoker    Packs/day: 1.50    Years: 38.00    Pack years: 57.00    Types: Cigarettes    Start date: 03/03/1974    Quit date: 05/14/2012    Years since quitting: 6.9  .  Smokeless tobacco: Never Used  . Tobacco comment: denies thoughts of restarting  Substance and Sexual Activity  . Alcohol use: No    Alcohol/week: 0.0 standard drinks  . Drug use: No  . Sexual activity: Not Currently    Birth control/protection: Surgical  Other Topics Concern  . Not on file  Social History Narrative   Work or School: [employer] Situation: lives with husband and daughters      Spiritual Beliefs: Christian      Lifestyle: no regular exercise; trying to eat healthy      [facility] (09/11/16):   Originally from [place]. Has always lived in [place]. No pets currently. Does have carpet in her home in her bedroom. No bird exposure. Previously had mold under her kitchen sink. No indoor plants. Previously was working in [employer].          Social Determinants of Health   Financial Resource Strain:   . Difficulty of Paying Living Expenses: Not on file  Food Insecurity:   . Worried About Charity fundraiser in the Last Year: Not on file  . Ran Out of Food in the Last Year: Not on file    Transportation Needs:   . Lack of Transportation (Medical): Not on file  . Lack of Transportation (Non-Medical): Not on file  Physical Activity:   . Days of Exercise per Week: Not on file  . Minutes of Exercise per Session: Not on file  Stress:   . Feeling of Stress : Not on file  Social Connections:   . Frequency of Communication with Friends and Family: Not on file  . Frequency of Social Gatherings with Friends and Family: Not on file  . Attends Religious Services: Not on file  . Active Member of Clubs or Organizations: Not on file  . Attends Archivist Meetings: Not on file  . Marital Status: Not on file     Family History: The patient's family history includes Anemia in her sister; COPD in her brother; Heart disease (age of onset: 65) in her mother; Hypertension in her father and mother; Leukemia in her sister and sister; Other in her sister; Stroke in her father. There is no history of Colon cancer.  ROS:   Please see the history of present illness.    All other systems reviewed and are negative.  EKGs/Labs/Other Studies Reviewed:    The following studies were reviewed today:  EKG: Sinus rhythm with PACs, rate 84, nonspecific ST abnormality lead III and aVL  Recent Labs: 08/06/2018: ALT 12; BUN 9; Creatinine, Ser 0.55; Hemoglobin 11.2; Platelets 258; Potassium 3.8; Sodium 142; TSH 2.100  Recent Lipid Panel    Component Value Date/Time   CHOL 161 07/25/2017 1846   TRIG 44 07/25/2017 1846   HDL 60 07/25/2017 1846   CHOLHDL 2.7 07/25/2017 1846   VLDL 9 07/25/2017 1846   LDLCALC 92 07/25/2017 1846    Physical Exam:    VS:  BP 132/78   Pulse 84   Temp (!) 97.2 F (36.2 C)   Ht 5' (1.524 m)   Wt (!) 329 lb 6.4 oz (149.4 kg)   SpO2 95%   BMI 64.33 kg/m     Wt Readings from Last 5 Encounters:  04/04/19 (!) 329 lb 6.4 oz (149.4 kg)  09/09/18 (!) 320 lb 3.2 oz (145.2 kg)  08/06/18 (!) 321 lb 12.8 oz (146 kg)  03/23/18 (!) 315 lb  8 oz (143.1 kg)   02/25/18 (!) 313 lb (142 kg)     Constitutional: No acute distress Eyes: sclera non-icteric, normal conjunctiva and lids ENMT: normal dentition, moist mucous membranes Cardiovascular: regular rhythm, normal rate, no murmurs. S1 and S2 normal. Radial pulses normal bilaterally. No jugular venous distention.  Respiratory: clear to auscultation bilaterally GI : normal bowel sounds, soft and nontender. No distention.   MSK: extremities warm, well perfused. No edema.  NEURO: grossly nonfocal exam, moves all extremities. PSYCH: alert and oriented x 3, normal mood and affect.   ASSESSMENT:    1. Chest pain, unspecified type   2. Shortness of breath   3. Morbid obesity (HCC)   4. Epigastric pain   5. Essential hypertension    PLAN:    Chest pain-she describes this as a bandlike sensation in the lower substernal region/epigastric region.  I am concerned with her history of smoking, hypertension, and morbid obesity that this may represent ischemia.  It will be necessary to perform stress testing.  I have independently reviewed the CT pulmonary embolism angiography performed, where she was felt to be a suboptimal candidate for CT pulmonary angiography.  This limits her ability to use CT coronary angiography so as to not administer unnecessary radiation referral potentially suboptimal study.  Stress testing will be challenging given body habitus however we may have reasonable image quality on the D SPECT camera at our [facility] location.  Patient should only be scanned on the D SPECT camera.  We will perform stress testing to exclude ischemia and evaluate for 3 transient ischemic dilatation.  Epigastric pain-very concerned with the patient's excessive use of NSAIDs for headaches.  This may very well be the source of her epigastric discomfort with eating.  I have cautioned her strongly on the deleterious effects of excessive NSAID use which she has been doing for years, including GI and renal  issues.  I have asked her to stop her use of NSAIDs right away.  It is imperative that we obtain a CBC and a BMP to evaluate renal function as well as hemoglobin.  I am concerned about NSAID gastritis and NSAID induced ulcers as a source of her pain.  Strongly recommend GI consultation.  Medication overuse headache-the patient is exhibiting features of medication overuse headache with high-dose NSAIDs multiple times a day for many years.  She states that her headache is daily and I have counseled her briefly on medication overuse headache.  It will be helpful for her to stop NSAIDs right away and evaluate if her headache improves.  Recommend further evaluation with PCP or neurology.  Hypertension-blood pressure is under the goal of 140/90, we should aim for 120/80.  Not currently on any antihypertensive therapy.  Continue to observe.  Myasthenia Audrie Lia reports that this is overall relatively stable and she continues on mycophenolate.  Total time of encounter: 45 minutes total time of encounter, including 30 minutes spent in face-to-face patient care. This time includes coordination of care and counseling regarding above-mentioned issues. Remainder of non-face-to-face time involved reviewing chart documents/testing relevant to the patient encounter and documentation in the medical record.  Cherlynn Kaiser, MD [place]  [facility]   Medication Adjustments/Labs and Tests Ordered: Current medicines are reviewed at length with the patient today.  Concerns regarding medicines are outlined above.  Orders Placed This Encounter  Procedures  . MYOCARDIAL PERFUSION IMAGING  . EKG 12-Lead   No orders of the defined types were placed in this encounter.  Patient Instructions  Medication Instructions:  STOP TAKING THE IBUPROFEN DAILY   CONTINUE ALL OTHER MEDICATIONS *If you need a refill on your cardiac medications before your next appointment, please call your pharmacy*  Lab Work: NOT  NEEDED  Testing/Procedures: WILL BE SCHEDULE AT [place] [address] Your physician has requested that you have a lexiscan myoview. Please follow instruction sheet, as given.    Follow-Up: At [facility], you and your health needs are our priority.  As part of our continuing mission to provide you with exceptional heart care, we have created designated Provider Care Teams.  These Care Teams include your primary Cardiologist (physician) and Advanced Practice Providers (APPs -  Physician Assistants and Nurse Practitioners) who all work together to provide you with the care you need, when you need it.  Your next appointment:   4 week(s)  The format for your next appointment:   In Person  Provider:   Cherlynn Kaiser, MD  Other Instructions n/a

## 2019-04-07 ENCOUNTER — Ambulatory Visit: Payer: BC Managed Care – PPO

## 2019-04-07 ENCOUNTER — Other Ambulatory Visit: Payer: Self-pay

## 2019-04-07 NOTE — Chronic Care Management (AMB) (Signed)
  Care Management   Outreach Note  04/07/2019 Name: Frances Medina MRN: [id number] DOB: March 02, 1961  Referred by: [name], MD Reason for referral : Care Coordination (Care management RNCM RLD/ OSA)   An unsuccessful telephone outreach was attempted today. The patient was referred to the case management team by for assistance with care management and care coordination.   Follow Up Plan: A HIPPA compliant phone message was left for the patient providing contact information and requesting a return call.  The care management team will reach out to the patient again over the next 14 days.   [name] RN, BSN, [facility] Care Management Coordinator [place] Phone: [phone] Fax: [phone]

## 2019-04-11 ENCOUNTER — Other Ambulatory Visit: Payer: Self-pay

## 2019-04-11 ENCOUNTER — Ambulatory Visit
Admission: RE | Admit: 2019-04-11 | Discharge: 2019-04-11 | Disposition: A | Discharge status: Home / Self Care | Payer: BC Managed Care – PPO | Source: Ambulatory Visit | Attending: Family Medicine | Admitting: Family Medicine

## 2019-04-11 DIAGNOSIS — Z1231 Encounter for screening mammogram for malignant neoplasm of breast: Secondary | ICD-10-CM

## 2019-04-12 ENCOUNTER — Telehealth (HOSPITAL_COMMUNITY): Payer: Self-pay | Admitting: *Deleted

## 2019-04-12 ENCOUNTER — Other Ambulatory Visit: Payer: Self-pay

## 2019-04-12 NOTE — Telephone Encounter (Signed)
Left message on voicemail per DPR in reference to upcoming appointment scheduled on 04/18/19 at 1:15 with detailed instructions given per Myocardial Perfusion Study Information Sheet for the test. LM to arrive 15 minutes early, and that it is imperative to arrive on time for appointment to keep from having the test rescheduled. If you need to cancel or reschedule your appointment, please call the office within 24 hours of your appointment. Failure to do so may result in a cancellation of your appointment, and a $50 no show fee. Phone number given for call back for any questions.

## 2019-04-15 ENCOUNTER — Other Ambulatory Visit: Payer: Self-pay

## 2019-04-15 MED ORDER — OMEPRAZOLE 20 MG PO CPDR: 20.0000 mg | DELAYED_RELEASE_CAPSULE | Freq: Every day | ORAL | 1 refills | Status: DC

## 2019-04-15 NOTE — Telephone Encounter (Signed)
Frances Medina, I haven't seen her in over a year. Yes, I thought she could eventually get off the PPI. Ok to refill  x2 if still needs it but please ask her to follow up with me to discuss.  Thanks

## 2019-04-15 NOTE — Telephone Encounter (Signed)
Prescription sent to pharmacy. Message left for patient to call the office to schedule an appointment.

## 2019-04-18 ENCOUNTER — Other Ambulatory Visit: Payer: Self-pay

## 2019-04-18 ENCOUNTER — Ambulatory Visit (HOSPITAL_COMMUNITY): Payer: BC Managed Care – PPO | Attending: Internal Medicine

## 2019-04-18 ENCOUNTER — Ambulatory Visit: Payer: BC Managed Care – PPO

## 2019-04-18 DIAGNOSIS — R0602 Shortness of breath: Secondary | ICD-10-CM | POA: Insufficient documentation

## 2019-04-18 MED ORDER — TECHNETIUM TC 99M TETROFOSMIN IV KIT
32.6000 | PACK | Freq: Once | INTRAVENOUS | Status: AC | PRN
  Administered 2019-04-18: 32.6 via INTRAVENOUS
  Filled 2019-04-18: qty 33

## 2019-04-18 NOTE — Chronic Care Management (AMB) (Signed)
  Care Management   Outreach Note  04/18/2019 Name: Frances Medina MRN: [id number] DOB: [birth year]  Referred by: [name], MD Reason for referral : Care Coordination (Care Management RNCM RLD/OSA 2nd attempt)   A second unsuccessful telephone outreach was attempted today. The patient was referred to the case management team for assistance with care management and care coordination.   Follow Up Plan: A HIPPA compliant phone message was left for the patient providing contact information and requesting a return call.  The care management team will reach out to the patient again over the next 5-7 days.   [name] RN, BSN, [name] Care Management Coordinator [place] Phone: [phone] Fax: [phone]

## 2019-04-19 ENCOUNTER — Other Ambulatory Visit: Payer: Self-pay

## 2019-04-19 ENCOUNTER — Ambulatory Visit (HOSPITAL_COMMUNITY): Payer: BC Managed Care – PPO | Attending: Cardiology

## 2019-04-19 DIAGNOSIS — R0602 Shortness of breath: Secondary | ICD-10-CM | POA: Diagnosis not present

## 2019-04-19 LAB — MYOCARDIAL PERFUSION IMAGING
LV dias vol: 75 mL (ref 46–106)
LV sys vol: 26 mL
Peak HR: 108 {beats}/min
Rest HR: 73 {beats}/min
SDS: 3
SRS: 0
SSS: 3
TID: 1.09

## 2019-04-19 MED ORDER — TECHNETIUM TC 99M TETROFOSMIN IV KIT
27.9000 | PACK | Freq: Once | INTRAVENOUS | Status: AC | PRN
  Administered 2019-04-19: 27.9 via INTRAVENOUS
  Filled 2019-04-19: qty 28

## 2019-04-19 MED ORDER — REGADENOSON 0.4 MG/5ML IV SOLN
0.4000 mg | Freq: Once | INTRAVENOUS | Status: AC
  Administered 2019-04-19: 0.4 mg via INTRAVENOUS

## 2019-04-22 ENCOUNTER — Other Ambulatory Visit: Payer: Self-pay

## 2019-04-22 ENCOUNTER — Ambulatory Visit: Payer: BC Managed Care – PPO

## 2019-04-22 NOTE — Chronic Care Management (AMB) (Signed)
  Care Management   Outreach Note  04/22/2019 Name: Frances Medina MRN: [id number] DOB: 07-Sep-1960  Referred by: [name], MD Reason for referral : Care Coordination (Care Management RNCM RLS/OSA 3rd attempt)   Third unsuccessful telephone outreach was attempted today. The patient was referred to the case management team for assistance with care management and care coordination. The patient's primary care provider has been notified of our unsuccessful attempts to make or maintain contact with the patient. The care management team is pleased to engage with this patient at any time in the future should he/she be interested in assistance from the care management team.   Follow Up Plan: A HIPPA compliant phone message was left for the patient providing contact information and requesting a return call.  The care management team is available to follow up with the patient after provider conversation with the patient regarding recommendation for care management engagement and subsequent re-referral to the care management team.   [name] RN, BSN, [facility] Care Management Coordinator [place] Phone: [phone] Fax: [phone]

## 2019-04-25 ENCOUNTER — Ambulatory Visit: Payer: Self-pay

## 2019-04-26 ENCOUNTER — Other Ambulatory Visit: Payer: Self-pay | Admitting: Family Medicine

## 2019-05-02 ENCOUNTER — Other Ambulatory Visit: Payer: Self-pay

## 2019-05-02 ENCOUNTER — Ambulatory Visit (INDEPENDENT_AMBULATORY_CARE_PROVIDER_SITE_OTHER): Payer: BC Managed Care – PPO | Admitting: Family Medicine

## 2019-05-02 VITALS — BP 130/80 | HR 75 | Wt 323.4 lb

## 2019-05-02 DIAGNOSIS — R6883 Chills (without fever): Secondary | ICD-10-CM

## 2019-05-02 DIAGNOSIS — R251 Tremor, unspecified: Secondary | ICD-10-CM | POA: Diagnosis not present

## 2019-05-02 NOTE — Patient Instructions (Addendum)
Thank you for coming to see me today. It was a pleasure! Today we talked about:   We will release your lab results on my chart and call you if there is anything abnormal.  Please follow-up with your neurologist regarding this headache.  If your symptoms worsen or do not improve then do not hesitate to come back to the office.  It was wonderful meeting you today.  Please follow-up as needed.  If you have any questions or concerns, please do not hesitate to call the office at [phone].  Take Care,   [place] Armya Westerhoff, DO

## 2019-05-02 NOTE — Progress Notes (Signed)
CHIEF COMPLAINT / HPI: 59 year old female with PMH of myasthenia gravis here today for tremors.  Diffuse tremors/Chills: Last Tuesday patient received IV contrast for a heart study in a couple of hours later experienced head to toe body chills that caused her to have a headache, body ache and lasted over an hour.  Patient was told that this could happen after the contrast that she did not think anything of it at the time. However on Saturday patient had another episode of head to toe shaking with chills.  She states she took a Klonopin when this started in order to see if that helped but it did not seem to.  She states she noticed that she felt really cold prior to the episode.  She states that during the event she was having some shortness of breath and trialed using her CPAP which did not help.  She states that she was able to talk through it but had some stutter because her mouth was shivering so much.  She stated that nothing helped her out of this.  She felt sleepy and tired afterwards and took a nap at which point she woke up and felt fine.  Patient reports she is never had anything like this happen again.  She denies any new medication aside from meloxicam that was started in January.  Patient states that on Tuesday she had some diarrhea but she did not have any on Saturday.  She denies any nausea, vomiting, fever, congestion.  States that the headache only occurred with the events.  Patient states that she has been compliant with her CellCept for myasthenia gravis with no changes in her medication in quite some time. She denies any changes to her diet and states that she did not eat anything with red meat on the day of these events.  She denies any dysuria, increased frequency, increased urgency.  She denies any chest pain.  She reports no history of thyroid issues.  PERTINENT  PMH / PSH: Myasthenia gravis, GERD, OSA, restrictive lung disease, prediabetes, GAD,h/o anemia  OBJECTIVE: BP 130/80    Pulse 75   Wt (!) 323 lb 6 oz (146.7 kg)   SpO2 97%   BMI 63.15 kg/m   General: NAD, pleasant Neck: Supple, no LAD Cardiovascular: RRR, no m/r/g, no LE edema Respiratory: CTA BL\, normal work of breathing Neuro: CN II-XII grossly intact, no focal deficit noted Psych: AOx3, appropriate affect  ASSESSMENT / PLAN:  Shaking chills Unclear etiology.  Episode on Tuesday could have been 2/2 receiving IV contrast however unclear why patient has similar episode on Saturday.  No new medications.   No change in diet. No change in myasthenia gravis treatment.  Patient reached out to her neurologist who told her she should be evaluated here first.  Less likely anxiety given that patient's Klonopin did not help during second episode. Less concerning from seizure given that patient was able to talk through the episode.  Patient with no known source of infection or clear indication of infection- not currently having any symptoms. Patient with no murmur on exam making endocarditis less likely, lungs CTA B.  Currently not having any urinary symptoms either but will obtain urine culture to rule this out. -Obtain CBC with differential, CMP, TSH and free T4 to rule out thyroid, urine culture as above. -Encourage patient to follow-up with her neurologist given that it may be resultant of myasthenia gravis -Patient to return if any further episodes occur or if she develops any fevers  or other systemic symptoms.    [place] Artemis Koller, DO PGY-3, [facility]

## 2019-05-03 LAB — CBC WITH DIFFERENTIAL/PLATELET
Basophils Absolute: 0 10*3/uL (ref 0.0–0.2)
Basos: 0 %
EOS (ABSOLUTE): 0 10*3/uL (ref 0.0–0.4)
Eos: 0 %
Hematocrit: 37.6 % (ref 34.0–46.6)
Hemoglobin: 11.9 g/dL (ref 11.1–15.9)
Immature Grans (Abs): 0 10*3/uL (ref 0.0–0.1)
Immature Granulocytes: 0 %
Lymphocytes Absolute: 1.8 10*3/uL (ref 0.7–3.1)
Lymphs: 23 %
MCH: 22.2 pg — ABNORMAL LOW (ref 26.6–33.0)
MCHC: 31.6 g/dL (ref 31.5–35.7)
MCV: 70 fL — ABNORMAL LOW (ref 79–97)
Monocytes Absolute: 0.4 10*3/uL (ref 0.1–0.9)
Monocytes: 5 %
Neutrophils Absolute: 5.8 10*3/uL (ref 1.4–7.0)
Neutrophils: 72 %
Platelets: 248 10*3/uL (ref 150–450)
RBC: 5.35 x10E6/uL — ABNORMAL HIGH (ref 3.77–5.28)
RDW: 16.3 % — ABNORMAL HIGH (ref 11.7–15.4)
WBC: 8.1 10*3/uL (ref 3.4–10.8)

## 2019-05-03 LAB — CMP14+EGFR
ALT: 12 IU/L (ref 0–32)
AST: 17 IU/L (ref 0–40)
Albumin/Globulin Ratio: 1.9 (ref 1.2–2.2)
Albumin: 4.5 g/dL (ref 3.8–4.9)
Alkaline Phosphatase: 122 IU/L — ABNORMAL HIGH (ref 39–117)
BUN/Creatinine Ratio: 13 (ref 9–23)
BUN: 9 mg/dL (ref 6–24)
Bilirubin Total: 0.3 mg/dL (ref 0.0–1.2)
CO2: 21 mmol/L (ref 20–29)
Calcium: 9.3 mg/dL (ref 8.7–10.2)
Chloride: 104 mmol/L (ref 96–106)
Creatinine, Ser: 0.68 mg/dL (ref 0.57–1.00)
GFR calc Af Amer: 112 mL/min/{1.73_m2} (ref >59)
GFR calc non Af Amer: 97 mL/min/{1.73_m2} (ref >59)
Globulin, Total: 2.4 g/dL (ref 1.5–4.5)
Glucose: 92 mg/dL (ref 65–99)
Potassium: 4.2 mmol/L (ref 3.5–5.2)
Sodium: 142 mmol/L (ref 134–144)
Total Protein: 6.9 g/dL (ref 6.0–8.5)

## 2019-05-03 LAB — TSH: TSH: 3.69 u[IU]/mL (ref 0.450–4.500)

## 2019-05-03 LAB — T4, FREE: Free T4: 1.26 ng/dL (ref 0.82–1.77)

## 2019-05-04 NOTE — Chronic Care Management (AMB) (Signed)
Care Management   Follow Up Note   05/04/2019 Name: Frances Medina MRN: [id number] DOB: [birth year]  Referred by: [name], MD Reason for referral : Care Coordination (Care Management RNCM RLD/OSA)   Frances Medina is a 59 y.o. year old female who is a primary care patient of [name], MD. The care management team was consulted for assistance with care management and care coordination needs.    Review of patient status, including review of consultants reports, relevant laboratory and other test results, and collaboration with appropriate care team members and the patient's provider was performed as part of comprehensive patient evaluation and provision of chronic care management services.    SDOH (Social Determinants of Health) assessments performed: No See Care Plan activities for detailed interventions related to [facility])     Advanced Directives: See Care Plan and Vynca application for related entries.   Goals Addressed            This Visit's Progress   . " I want to lose weight to help me breath better" (pt-stated)       Current Barriers:  [name] Knowledge Deficits related to weight loss strategies as evidenced by weight gain reported weight 320 lbs. . Chronic Disease Management support and education needs related to weight management in patient with  OSA/Restrictive Lung Diease Nurse Case Manager Clinical Goal(s):  [name] Over the next 30 days, patient will verbalize basic understanding of plan for initiation of weight loss strategies . Over the next 90 days, patient will demonstrate improved health management independence as evidenced by keeping daily food log/diary and increasing daily water intake (patient does not have provider prescribed fluid restriction) . Over the next 30 days, patient will work with RN Case Manager to address needs related to any barriers that she may have related to weight loss  Interventions: 03/22/19 . Evaluation of current treatment plan  related to weight loss and patient's adherence to plan as established by provider . Provided verbal and/or written education to patient re: recommended life style changes: avoid fad diets, make small/incremental dietary and light exercise changes( IE.., walk around her kitchen table, sit in a chair and exercise her arms with  small water bottles for a few minutes a day.), eat at the table and avoid eating in front of the TV, plan management of cravings, monitor snacking and cravings in food diary . Discussed plans with patient for ongoing care management follow up and provided patient with direct contact information for care management team . Reduces sodium in her diet, discussed the effects of sodium on the body. . Monitor fried foods . Weigh monthly . Be realistic on weight to lose.  Patient states 1-2 lbs to start.  . 04/25/19 . Patient returned call and stated that she has been out of the home going to appointment and missing my calls . She states that she is still working on her meals to lose weight.  We discussed calories per day and discussed a 2000 calorie/day meal plan.  I sent her a 2000 calorie meal plan to look at  to get ideas. . She states that she eats a lot of salad and drinks water. I advised she needs to have a variety of foods so she will not get bored with the same thing  and try to have healthy choices. . As far as exercise, the main thing is chores that she does in the home.  I advised her not to worry about the  exercise as much but to focus on eating healthy.  Not to over exert herself.  Patient verbalized understanding. . We have scheduled a face to face appointment but she will call and let me know if she will be able to keep the appointment due to transportation issues.  Patient Self Care Activities:  . Performs ADL's independently . Calls provider office for new concerns or questions . Unable to independently self manage weight loss  Initial goal documentation           The patient has been provided with contact information for the care management team and has been advised to call with any health related questions or concerns.   [name] RN, BSN, [facility] Care Management Coordinator [place] Phone: [phone] Fax: [phone]

## 2019-05-04 NOTE — Patient Instructions (Signed)
Visit Information  Goals Addressed            This Visit's Progress   . " I want to lose weight to help me breath better" (pt-stated)       Current Barriers:  [name] Knowledge Deficits related to weight loss strategies as evidenced by weight gain reported weight 320 lbs. . Chronic Disease Management support and education needs related to weight management in patient with  OSA/Restrictive Lung Diease Nurse Case Manager Clinical Goal(s):  [name] Over the next 30 days, patient will verbalize basic understanding of plan for initiation of weight loss strategies . Over the next 90 days, patient will demonstrate improved health management independence as evidenced by keeping daily food log/diary and increasing daily water intake (patient does not have provider prescribed fluid restriction) . Over the next 30 days, patient will work with RN Case Manager to address needs related to any barriers that she may have related to weight loss  Interventions: 03/22/19 . Evaluation of current treatment plan related to weight loss and patient's adherence to plan as established by provider . Provided verbal and/or written education to patient re: recommended life style changes: avoid fad diets, make small/incremental dietary and light exercise changes( IE.., walk around her kitchen table, sit in a chair and exercise her arms with  small water bottles for a few minutes a day.), eat at the table and avoid eating in front of the TV, plan management of cravings, monitor snacking and cravings in food diary . Discussed plans with patient for ongoing care management follow up and provided patient with direct contact information for care management team . Reduces sodium in her diet, discussed the effects of sodium on the body. . Monitor fried foods . Weigh monthly . Be realistic on weight to lose.  Patient states 1-2 lbs to start.  . 04/25/19 . Patient returned call and stated that she has been out of the home going to  appointment and missing my calls . She states that she is still working on her meals to lose weight.  We discussed calories per day and discussed a 2000 calorie/day meal plan.  I sent her a 2000 calorie meal plan to look at  to get ideas. . She states that she eats a lot of salad and drinks water. I advised she needs to have a variety of foods so she will not get bored with the same thing  and try to have healthy choices. . As far as exercise, the main thing is chores that she does in the home.  I advised her not to worry about the exercise as much but to focus on eating healthy.  Not to over exert herself.  Patient verbalized understanding. . We have scheduled a face to face appointment but she will call and let me know if she will be able to keep the appointment due to transportation issues.  Patient Self Care Activities:  . Performs ADL's independently . Calls provider office for new concerns or questions . Unable to independently self manage weight loss  Initial goal documentation        Ms. Scalia was given information about Care Management services today including:  1. Care Management services include personalized support from designated clinical staff supervised by her physician, including individualized plan of care and coordination with other care providers 2. 24/7 contact phone numbers for assistance for urgent and routine care needs. 3. The patient may stop CCM services at any time (effective at the end  of the month) by phone call to the office staff.  Patient agreed to services and verbal consent obtained.   The patient verbalized understanding of instructions provided today and declined a print copy of patient instruction materials.   The patient has been provided with contact information for the care management team and has been advised to call with any health related questions or concerns.   [name] RN, BSN, [facility] Care Management Coordinator [place] Phone: [phone] Fax: [phone]

## 2019-05-05 ENCOUNTER — Other Ambulatory Visit: Payer: Self-pay | Admitting: Family Medicine

## 2019-05-05 DIAGNOSIS — R6883 Chills (without fever): Secondary | ICD-10-CM | POA: Insufficient documentation

## 2019-05-05 NOTE — Assessment & Plan Note (Addendum)
Unclear etiology.  Episode on Tuesday could have been 2/2 receiving IV contrast however unclear why patient has similar episode on Saturday.  No new medications.   No change in diet. No change in myasthenia gravis treatment.  Patient reached out to her neurologist who told her she should be evaluated here first.  Less likely anxiety given that patient's Klonopin did not help during second episode. Less concerning from seizure given that patient was able to talk through the episode.  Patient with no known source of infection or clear indication of infection- not currently having any symptoms. Patient with no murmur on exam making endocarditis less likely, lungs CTA B.  Currently not having any urinary symptoms either but will obtain urine culture to rule this out. -Obtain CBC with differential, CMP, TSH and free T4 to rule out thyroid, urine culture as above. -Encourage patient to follow-up with her neurologist given that it may be resultant of myasthenia gravis -Patient to return if any further episodes occur or if she develops any fevers or other systemic symptoms.

## 2019-05-10 ENCOUNTER — Ambulatory Visit: Payer: BC Managed Care – PPO | Admitting: Internal Medicine

## 2019-05-16 ENCOUNTER — Other Ambulatory Visit: Payer: Self-pay

## 2019-05-16 ENCOUNTER — Ambulatory Visit: Payer: BC Managed Care – PPO

## 2019-05-16 NOTE — Chronic Care Management (AMB) (Signed)
Care Management   Follow Up Note   05/16/2019 Name: Frances Medina MRN: [id number] DOB: [birth year]  Referred by: Frances Feil, MD Reason for referral : Care Coordination (Care Management RNCM RLD/Obesity)   Frances Medina is a 59 y.o. year old female who is a primary care patient of Frances Feil, MD. The care management team was consulted for assistance with care management and care coordination needs.    Review of patient status, including review of consultants reports, relevant laboratory and other test results, and collaboration with appropriate care team members and the patient's provider was performed as part of comprehensive patient evaluation and provision of chronic care management services.    SDOH (Social Determinants of Health) assessments performed: Yes See Care Plan activities for detailed interventions related to [facility])     Advanced Directives: See Care Plan and Vynca application for related entries.   Goals Addressed            This Visit's Progress   . " I want to lose weight to help me breath better" (pt-stated)       Current Barriers:  [name] Knowledge Deficits related to weight loss strategies as evidenced by weight gain reported weight 320 lbs. . Chronic Disease Management support and education needs related to weight management in patient with  OSA/Restrictive Lung Diease Nurse Case Manager Clinical Goal(s):  [name] Over the next 30 days, patient will verbalize basic understanding of plan for initiation of weight loss strategies . Over the next 90 days, patient will demonstrate improved health management independence as evidenced by keeping daily food log/diary and increasing daily water intake (patient does not have provider prescribed fluid restriction) . Over the next 30 days, patient will work with RN Case Manager to address needs related to any barriers that she may have related to weight loss  Interventions: 03/22/19 . Evaluation of current treatment plan  related to weight loss and patient's adherence to plan as established by provider . Provided verbal and/or written education to patient re: recommended life style changes: avoid fad diets, make small/incremental dietary and light exercise changes( IE.., walk around her kitchen table, sit in a chair and exercise her arms with  small water bottles for a few minutes a day.), eat at the table and avoid eating in front of the TV, plan management of cravings, monitor snacking and cravings in food diary . Discussed plans with patient for ongoing care management follow up and provided patient with direct contact information for care management team . Reduces sodium in her diet, discussed the effects of sodium on the body. . Monitor fried foods . Weigh monthly . Be realistic on weight to lose.  Patient states 1-2 lbs to start.  . 04/25/19 . Patient returned call and stated that she has been out of the home going to appointment and missing my calls . She states that she is still working on her meals to lose weight.  We discussed calories per day and discussed a 2000 calorie/day meal plan.  I sent her a 2000 calorie meal plan to look at  to get ideas. . She states that she eats a lot of salad and drinks water. I advised she needs to have a variety of foods so she will not get bored with the same thing  and try to have healthy choices. . As far as exercise, the main thing is chores that she does in the home.  I advised her not to worry about the  exercise as much but to focus on eating healthy.  Not to over exert herself.  Patient verbalized understanding. . We have scheduled a face to face appointment but she will call and let me know if she will be able to keep the appointment due to transportation issues. 05/16/19 o Patient states that she is feeling better from her visit in the office on 3/1 denies any symptoms currently. o She states that she has no appetite, no swelling but has shortness of breath "off and  on"   o She is having some constipation but is taking a stool softener and drinking water  o She states that she has some nausea.  We discussed her medications and she take her Iron pill on an empty stomach.  I advised that sometimes iron pills can make you nauseated.  She should eat something when takin the pill and drinking orange juice will help absorption. o She continues to take her medications as prescribed. o Patient states that she is exercising, she has a floor foot bike that she is using with bands for her arms.  She is doing 15-minute intervals three times a day.  Told the patient not to over do but that it is a wonderful way to get her exercise in.  Patient Self Care Activities:  . Performs ADL's independently . Calls provider office for new concerns or questions . Unable to independently self manage weight loss  Initial goal documentation         The care management team will reach out to the patient again over the next 14 days.  The patient has been provided with contact information for the care management team and has been advised to call with any health related questions or concerns.   [name] RN, BSN, [facility] Care Management Coordinator [place] Phone: [phone] Fax: [phone]

## 2019-05-16 NOTE — Patient Instructions (Signed)
Visit Information  Goals Addressed            This Visit's Progress   . " I want to lose weight to help me breath better" (pt-stated)       Current Barriers:  [name] Knowledge Deficits related to weight loss strategies as evidenced by weight gain reported weight 320 lbs. . Chronic Disease Management support and education needs related to weight management in patient with  OSA/Restrictive Lung Diease Nurse Case Manager Clinical Goal(s):  [name] Over the next 30 days, patient will verbalize basic understanding of plan for initiation of weight loss strategies . Over the next 90 days, patient will demonstrate improved health management independence as evidenced by keeping daily food log/diary and increasing daily water intake (patient does not have provider prescribed fluid restriction) . Over the next 30 days, patient will work with RN Case Manager to address needs related to any barriers that she may have related to weight loss  Interventions: 03/22/19 . Evaluation of current treatment plan related to weight loss and patient's adherence to plan as established by provider . Provided verbal and/or written education to patient re: recommended life style changes: avoid fad diets, make small/incremental dietary and light exercise changes( IE.., walk around her kitchen table, sit in a chair and exercise her arms with  small water bottles for a few minutes a day.), eat at the table and avoid eating in front of the TV, plan management of cravings, monitor snacking and cravings in food diary . Discussed plans with patient for ongoing care management follow up and provided patient with direct contact information for care management team . Reduces sodium in her diet, discussed the effects of sodium on the body. . Monitor fried foods . Weigh monthly . Be realistic on weight to lose.  Patient states 1-2 lbs to start.  . 04/25/19 . Patient returned call and stated that she has been out of the home going to  appointment and missing my calls . She states that she is still working on her meals to lose weight.  We discussed calories per day and discussed a 2000 calorie/day meal plan.  I sent her a 2000 calorie meal plan to look at  to get ideas. . She states that she eats a lot of salad and drinks water. I advised she needs to have a variety of foods so she will not get bored with the same thing  and try to have healthy choices. . As far as exercise, the main thing is chores that she does in the home.  I advised her not to worry about the exercise as much but to focus on eating healthy.  Not to over exert herself.  Patient verbalized understanding. . We have scheduled a face to face appointment but she will call and let me know if she will be able to keep the appointment due to transportation issues. 05/16/19 o Patient states that she is feeling better from her visit in the office on 3/1 denies any symptoms currently. o She states that she has no appetite, no swelling but has shortness of breath "off and on"   o She is having some constipation but is taking a stool softener and drinking water  o She states that she has some nausea.  We discussed her medications and she take her Iron pill on an empty stomach.  I advised that sometimes iron pills can make you nauseated.  She should eat something when takin the pill and drinking orange  juice will help absorption. o She continues to take her medications as prescribed. o Patient states that she is exercising, she has a floor foot bike that she is using with bands for her arms.  She is doing 15-minute intervals three times a day.  Told the patient not to over do but that it is a wonderful way to get her exercise in.  Patient Self Care Activities:  . Performs ADL's independently . Calls provider office for new concerns or questions . Unable to independently self manage weight loss  Initial goal documentation        Frances Medina was given information about  Care Management services today including:  1. Care Management services include personalized support from designated clinical staff supervised by her physician, including individualized plan of care and coordination with other care providers 2. 24/7 contact phone numbers for assistance for urgent and routine care needs. 3. The patient may stop CCM services at any time (effective at the end of the month) by phone call to the office staff.  Patient agreed to services and verbal consent obtained.   The patient verbalized understanding of instructions provided today and declined a print copy of patient instruction materials.   The care management team will reach out to the patient again over the next 14 days.  The patient has been provided with contact information for the care management team and has been advised to call with any health related questions or concerns.   [name] RN, BSN, [facility] Care Management Coordinator [place] Phone: [phone] Fax: [phone]

## 2019-05-27 ENCOUNTER — Encounter: Payer: Self-pay | Admitting: Physician Assistant

## 2019-05-27 ENCOUNTER — Ambulatory Visit (INDEPENDENT_AMBULATORY_CARE_PROVIDER_SITE_OTHER): Payer: BC Managed Care – PPO | Admitting: Physician Assistant

## 2019-05-27 VITALS — BP 132/84 | HR 76 | Temp 97.2°F | Ht 61.0 in | Wt 326.8 lb

## 2019-05-27 DIAGNOSIS — K219 Gastro-esophageal reflux disease without esophagitis: Secondary | ICD-10-CM

## 2019-05-27 DIAGNOSIS — Z86010 Personal history of colonic polyps: Secondary | ICD-10-CM | POA: Diagnosis not present

## 2019-05-27 MED ORDER — NA SULFATE-K SULFATE-MG SULF 17.5-3.13-1.6 GM/177ML PO SOLN: 1.0000 | Freq: Once | ORAL | 0 refills | Status: AC

## 2019-05-27 MED ORDER — OMEPRAZOLE 20 MG PO CPDR: 20.0000 mg | DELAYED_RELEASE_CAPSULE | Freq: Every day | ORAL | 3 refills | Status: DC

## 2019-05-27 NOTE — Patient Instructions (Addendum)
If you are age 58 or older, your body mass index should be between 23-30. Your Body mass index is 61.75 kg/m. If this is out of the aforementioned range listed, please consider follow up with your Primary Care Provider.  If you are age 18 or younger, your body mass index should be between 19-25. Your Body mass index is 61.75 kg/m. If this is out of the aformentioned range listed, please consider follow up with your Primary Care Provider.   You have been scheduled for an endoscopy and colonoscopy. Please follow the written instructions given to you at your visit today. Please pick up your prep supplies at the pharmacy within the next 1-3 days. If you use inhalers (even only as needed), please bring them with you on the day of your procedure.  Refills of your Omeprazole have been sent to the pharmacy.  Follow up pending results of your procedures.

## 2019-05-27 NOTE — Progress Notes (Signed)
Chief Complaint: GERD  HPI:    Frances Medina is a 59 year old female with past medical history as listed below, known to Dr. Fuller Plan, who presents clinic today with a complaint of reflux.    11/10/2017 patient seen in clinic by Tye Savoy.  At that time she was following up on early satiety and upper abdominal discomfort.  She felt better after reducing the ibuprofen and starting Omeprazole.  Patient also had chronic microcytic anemia which was in proving.  She had reflux which is asymptomatic on Zantac at bedtime.  It was noted that she had a history of colon polyps with her last colonoscopy in 2015 with recommendation for repeat in July 2020.    Today, the patient presents clinic and tells me she is doing very well.  She is currently only using her Omeprazole 20 mg daily and her reflux is well controlled.  She has no other GI complaints.  Tells me that she is trying to get gastric bypass surgery at some point soon but was told that she needed to follow-up with [place] prior to having this procedure.  Does not have any of her paperwork with her.    Denies fever, chills, weight loss, nausea, vomiting or blood in her stool.  Past Medical History:  Diagnosis Date  . Acute respiratory failure ([place])   . Acute respiratory failure with hypoxemia ([place])   . Aspiration into airway   . CAP (community acquired pneumonia) 08/05/2015  . Depression with anxiety   . Diverticulosis   . Dysphagia 03/2015    EGD, Dr Carlean Purl. mild antral gastritis, ? due to Ibuprofen.  no stricture but empirically maloney dilated esophagus.   . E. coli UTI 04/07/2015  . Elevated LDH 05/06/2015  . Encounter for routine gynecological examination 10/19/2015  . Endotracheally intubated   . Enteritis due to Clostridium difficile   . Fibroid uterus    size of a dime  . Gastrostomy infection ([place]) 11/08/2015  . Gastrostomy infection ([place]) 11/08/2015  . GERD (gastroesophageal reflux disease)    hiatal hernia  . Hypertension    "went away when  I stopped smoking"  . Knee injury   . Left hip pain 02/18/2017  . Migraine    "maybe couple times/month" (03/20/2015)  . Mild intermittent asthma 06/22/2017  . Myasthenia gravis ([place]) 2017  . Myasthenia gravis with acute exacerbation ([place]) 05/15/2015  . Osteoarthritis of left knee 12/02/2013  . Osteoarthritis of right knee 08/30/2013  . Pancreatitis 07/25/2017  . Protein-calorie malnutrition, severe ([place]) 08/07/2015  . Seasonal allergies    takes Zytrec  . Tracheostomy status ([place])   . Tubular adenoma of colon   . Tumors    "in my stomach"  . Umbilical hernia    watching , no plans for surgery at present  . VAP (ventilator-associated pneumonia) [facility])     Past Surgical History:  Procedure Laterality Date  . CESAREAN SECTION  1987; 1989  . COLONOSCOPY WITH PROPOFOL N/A 09/19/2013   Procedure: COLONOSCOPY WITH PROPOFOL;  Surgeon: Ladene Artist, MD;  Location: [facility];  Service: Endoscopy;  Laterality: N/A;  . DILATION AND CURETTAGE OF UTERUS    . ESOPHAGOGASTRODUODENOSCOPY N/A 08/10/2015   Procedure: ESOPHAGOGASTRODUODENOSCOPY (EGD);  Surgeon: Gatha Mayer, MD;  Location: [facility];  Service: Endoscopy;  Laterality: N/A;  . ESOPHAGOGASTRODUODENOSCOPY (EGD) WITH PROPOFOL N/A 03/21/2015   Procedure: ESOPHAGOGASTRODUODENOSCOPY (EGD) WITH PROPOFOL;  Surgeon: Gatha Mayer, MD;  Location: [place];  Service: Endoscopy;  Laterality: N/A;  .  PARTIAL KNEE ARTHROPLASTY Right 08/30/2013   Procedure: RIGHT UNICOMPARTMENTAL KNEE;  Surgeon: Johnny Bridge, MD;  Location: [place];  Service: Orthopedics;  Laterality: Right;  . PARTIAL KNEE ARTHROPLASTY Left 12/02/2013   Procedure: LEFT KNEE UNI ARTHROPLASTY;  Surgeon: Johnny Bridge, MD;  Location: [place];  Service: Orthopedics;  Laterality: Left;  . PEG PLACEMENT N/A 08/10/2015   Procedure: PERCUTANEOUS ENDOSCOPIC GASTROSTOMY (PEG) PLACEMENT;  Surgeon: Gatha Mayer, MD;  Location: [place];  Service: Endoscopy;   Laterality: N/A;  . TUBAL LIGATION  1989  . VAGINAL HYSTERECTOMY  1990's?   "apparently took out one of my ovaries at the time too cause one's missing"    Current Outpatient Medications  Medication Sig Dispense Refill  . albuterol (PROVENTIL) (2.5 MG/3ML) 0.083% nebulizer solution USE 1 VIAL IN NEBULIZER EVERY 6 HOURS AS NEEDED FOR SHORTNESS OF BREATH AND WHEEZING 150 mL 0  . albuterol (VENTOLIN HFA) 108 (90 Base) MCG/ACT inhaler INHALE 2 PUFFS INTO THE LUNGS EVERY 6 HOURS AS NEEDED FOR WHEEZE OR SHORTNESS OF BREATH 18 g 3  . benzonatate (TESSALON) 100 MG capsule TAKE 1 CAPSULE BY MOUTH TWICE A DAY AS NEEDED FOR COUGH 20 capsule 3  . cetirizine (ZYRTEC) 10 MG tablet TAKE 1 TABLET BY MOUTH EVERY DAY 90 tablet 2  . clonazePAM (KLONOPIN) 0.5 MG tablet TAKE 1 TABLET BY MOUTH TWICE A DAY AS NEEDED FOR ANXIETY 60 tablet 2  . docusate sodium (COLACE) 100 MG capsule Take 1 capsule (100 mg total) by mouth 2 (two) times daily as needed for mild constipation or moderate constipation. 10 capsule 0  . ferrous sulfate 325 (65 FE) MG tablet TAKE 1 TABLET BY MOUTH EVERY DAY WITH BREAKFAST 90 tablet 3  . fluticasone (FLONASE) 50 MCG/ACT nasal spray Place 2 sprays into both nostrils daily. 16 g 6  . guaiFENesin (ROBITUSSIN) 100 MG/5ML SOLN Take 15 mLs by mouth daily as needed for cough or to loosen phlegm.    [name] LACTOBACILLUS PO Take 1 tablet by mouth.    . meloxicam (MOBIC) 15 MG tablet Take 1 tablet (15 mg total) by mouth daily. 30 tablet 2  . Menthol (COUGH DROPS) 10 MG LOZG Take 1 lozenge by mouth daily as needed (cough).    . montelukast (SINGULAIR) 10 MG tablet Take 1 tablet (10 mg total) by mouth at bedtime. 30 tablet 3  . mycophenolate (CELLCEPT) 500 MG tablet Take 1,000 mg by mouth 2 (two) times daily.    [name] omeprazole (PRILOSEC) 20 MG capsule Take 1 capsule (20 mg total) by mouth daily. 30 capsule 1  . sertraline (ZOLOFT) 50 MG tablet TAKE 3 TABLETS BY MOUTH EVERY DAY 270 tablet 2   No current  facility-administered medications for this visit.    Allergies as of 05/27/2019 - Review Complete 05/02/2019  Allergen Reaction Noted  . Ciprofloxacin Shortness Of Breath and Rash 04/07/2015  . Naproxen Shortness Of Breath 03/01/2019  . Shrimp [shellfish allergy] Hives and Shortness Of Breath 04/05/2015  . Dicyclomine Hives 01/14/2011  . Methocarbamol Hives 09/19/2013  . Penicillins Hives 08/18/2013  . Morphine and related Other (See Comments) 10/01/2017    Family History  Problem Relation Age of Onset  . Heart disease Mother 64  . Hypertension Mother   . Stroke Father   . Hypertension Father   . Other Sister        Esophageal strictures  . Anemia Sister   . COPD Brother   . Leukemia Sister   .  Leukemia Sister   . Colon cancer Neg Hx     Social History   Socioeconomic History  . Marital status: Married    Spouse name: Not on file  . Number of children: 3  . Years of education: Not on file  . Highest education level: Not on file  Occupational History  . Occupation: disabled  Tobacco Use  . Smoking status: Former Smoker    Packs/day: 1.50    Years: 38.00    Pack years: 57.00    Types: Cigarettes    Start date: 03/03/1974    Quit date: 05/14/2012    Years since quitting: 7.0  . Smokeless tobacco: Never Used  . Tobacco comment: denies thoughts of restarting  Substance and Sexual Activity  . Alcohol use: No    Alcohol/week: 0.0 standard drinks  . Drug use: No  . Sexual activity: Not Currently    Birth control/protection: Surgical  Other Topics Concern  . Not on file  Social History Narrative   Work or School: [employer] Situation: lives with husband and daughters      Spiritual Beliefs: Christian      Lifestyle: no regular exercise; trying to eat healthy      [facility] (09/11/16):   Originally from [facility]. Has always lived in [place]. No pets currently. Does have carpet in her home in her bedroom. No bird exposure.  Previously had mold under her kitchen sink. No indoor plants. Previously was working in [employer].          Social Determinants of Health   Financial Resource Strain:   . Difficulty of Paying Living Expenses:   Food Insecurity:   . Worried About Charity fundraiser in the Last Year:   . Arboriculturist in the Last Year:   Transportation Needs:   . Film/video editor (Medical):   [name] Lack of Transportation (Non-Medical):   Physical Activity: Unknown  . Days of Exercise per Week: 3 days  . Minutes of Exercise per Session: Not on file  Stress:   . Feeling of Stress :   Social Connections:   . Frequency of Communication with Friends and Family:   . Frequency of Social Gatherings with Friends and Family:   . Attends Religious Services:   . Active Member of Clubs or Organizations:   . Attends Archivist Meetings:   [name] Marital Status:   Intimate Partner Violence:   . Fear of Current or Ex-Partner:   . Emotionally Abused:   [name] Physically Abused:   . Sexually Abused:     Review of Systems:    Constitutional: No weight loss, fever or chills Cardiovascular: No chest pain Respiratory: No SOB Gastrointestinal: See HPI and otherwise negative   Physical Exam:  Vital signs: BP 132/84   Pulse 76   Temp (!) 97.2 F (36.2 C)   Ht 5\' 1"  (1.549 m)   Wt (!) 326 lb 12.8 oz (148.2 kg)   BMI 61.75 kg/m   Constitutional:   Pleasant morbidly obese female appears to be in NAD, Well developed, Well nourished, alert and cooperative Respiratory: Respirations even and unlabored. Lungs clear to auscultation bilaterally.   No wheezes, crackles, or rhonchi.  Cardiovascular: Normal S1, S2. No MRG. Regular rate and rhythm. No peripheral edema, cyanosis or pallor.  Gastrointestinal:  Soft, nondistended, nontender. No rebound or guarding. Normal bowel sounds. No appreciable masses or hepatomegaly. Rectal:  Not  performed.  Psychiatric: Demonstrates good judgement and reason  without abnormal affect or behaviors.  RELEVANT LABS AND IMAGING: CBC    Component Value Date/Time   WBC 8.1 05/02/2019 1212   WBC 8.5 10/01/2017 1501   RBC 5.35 (H) 05/02/2019 1212   RBC 5.20 (H) 10/01/2017 1501   HGB 11.9 05/02/2019 1212   HGB 12.4 10/30/2015 1112   HCT 37.6 05/02/2019 1212   HCT 38.9 10/30/2015 1112   PLT 248 05/02/2019 1212   MCV 70 (L) 05/02/2019 1212   MCV 72.2 (L) 10/30/2015 1112   MCH 22.2 (L) 05/02/2019 1212   MCH 21.2 (L) 07/28/2017 0625   MCHC 31.6 05/02/2019 1212   MCHC 31.3 10/01/2017 1501   RDW 16.3 (H) 05/02/2019 1212   RDW 19.0 (H) 10/30/2015 1112   LYMPHSABS 1.8 05/02/2019 1212   LYMPHSABS 1.0 10/30/2015 1112   MONOABS 0.5 07/26/2017 0310   MONOABS 0.4 10/30/2015 1112   EOSABS 0.0 05/02/2019 1212   BASOSABS 0.0 05/02/2019 1212   BASOSABS 0.0 10/30/2015 1112    CMP     Component Value Date/Time   NA 142 05/02/2019 1212   NA 141 10/30/2015 1112   K 4.2 05/02/2019 1212   K 4.2 10/30/2015 1112   CL 104 05/02/2019 1212   CO2 21 05/02/2019 1212   CO2 26 10/30/2015 1112   GLUCOSE 92 05/02/2019 1212   GLUCOSE 86 10/01/2017 1501   GLUCOSE 94 10/30/2015 1112   BUN 9 05/02/2019 1212   BUN 21.6 10/30/2015 1112   CREATININE 0.68 05/02/2019 1212   CREATININE 0.72 11/07/2015 0911   CREATININE 0.7 10/30/2015 1112   CALCIUM 9.3 05/02/2019 1212   CALCIUM 9.9 10/30/2015 1112   PROT 6.9 05/02/2019 1212   PROT 7.1 10/30/2015 1112   ALBUMIN 4.5 05/02/2019 1212   ALBUMIN 3.4 (L) 10/30/2015 1112   AST 17 05/02/2019 1212   AST 13 10/30/2015 1112   ALT 12 05/02/2019 1212   ALT 16 10/30/2015 1112   ALKPHOS 122 (H) 05/02/2019 1212   ALKPHOS 80 10/30/2015 1112   BILITOT 0.3 05/02/2019 1212   BILITOT 0.40 10/30/2015 1112   GFRNONAA 97 05/02/2019 1212   GFRNONAA >89 11/07/2015 0911   GFRAA 112 05/02/2019 1212   GFRAA >89 11/07/2015 0911    Assessment: 1.  GERD: Well-controlled with Omeprazole 20 mg daily 2.  History of polyps: Last  colonoscopy in 2015 with recommendations to repeat in 5 years 3.  Upcoming gastric bypass: Question whether or not [facility] would like an EGD prior to procedure, last EGD in 2017  Plan: 1.  We have called [facility] and are waiting on their recommendations whether or not they need an EGD prior to gastric bypass.  If they do, we will schedule the patient for an EGD and colonoscopy at the hospital due to her BMI. 2.  Patient is also due a surveillance colonoscopy given her history of colon polyps.  Again this will need to be scheduled in the hospital due to her BMI.  Went ahead and discussed risks, benefits, limitations and alternatives and the patient agrees to proceed.  She will be scheduled with Dr. Fuller Plan. 3.  Continue Omeprazole 20 mg daily.  Refilled #90 with 3 refills. 4.  We will contact the patient once we are aware of [facility]'s recommendations to schedule her for procedures. 5.  Patient will follow in clinic per recommendations from Dr. Fuller Plan after time of procedures.  Ellouise Newer, PA-C [facility] 05/27/2019, 8:26 AM  Cc: Gwendlyn Deutscher,  Phill Myron, MD

## 2019-05-28 ENCOUNTER — Other Ambulatory Visit: Payer: Self-pay | Admitting: Family Medicine

## 2019-05-29 NOTE — Progress Notes (Signed)
Reviewed and agree with management plan.  Caige Almeda T. Lusia Greis, MD FACG [facility]  

## 2019-05-30 ENCOUNTER — Telehealth: Payer: Self-pay

## 2019-05-30 ENCOUNTER — Other Ambulatory Visit: Payer: Self-pay

## 2019-05-30 ENCOUNTER — Encounter: Payer: Self-pay | Admitting: Internal Medicine

## 2019-05-30 ENCOUNTER — Ambulatory Visit (INDEPENDENT_AMBULATORY_CARE_PROVIDER_SITE_OTHER): Payer: BC Managed Care – PPO | Admitting: Internal Medicine

## 2019-05-30 VITALS — BP 126/84 | HR 74 | Temp 97.0°F | Ht 60.0 in | Wt 328.0 lb

## 2019-05-30 DIAGNOSIS — R0602 Shortness of breath: Secondary | ICD-10-CM | POA: Diagnosis not present

## 2019-05-30 DIAGNOSIS — R1013 Epigastric pain: Secondary | ICD-10-CM | POA: Diagnosis not present

## 2019-05-30 DIAGNOSIS — G4733 Obstructive sleep apnea (adult) (pediatric): Secondary | ICD-10-CM

## 2019-05-30 DIAGNOSIS — R079 Chest pain, unspecified: Secondary | ICD-10-CM

## 2019-05-30 DIAGNOSIS — I10 Essential (primary) hypertension: Secondary | ICD-10-CM

## 2019-05-30 DIAGNOSIS — Z9989 Dependence on other enabling machines and devices: Secondary | ICD-10-CM

## 2019-05-30 DIAGNOSIS — G7000 Myasthenia gravis without (acute) exacerbation: Secondary | ICD-10-CM

## 2019-05-30 NOTE — Progress Notes (Signed)
Cardiology Office Note:    Date:  05/30/2019   ID:  Frances Medina, DOB October 01, 1960, MRN [id number]  PCP:  [name], MD  Cardiologist:  No primary care provider on file.  Electrophysiologist:  None   Referring MD: [name], MD   Chief Complaint: epigastric pain  History of Present Illness:    Frances Medina is a 59 y.o. female with a history of myasthenia gravis on mycophenolate therapy, morbid obesity, obstructive sleep apnea, restrictive lung disease by pulmonary function testinglikely secondary to obesity, andanxiety.  Epigastric pain gone after stopping ibuprofen.   Stress test low risk.  We discussed that there is an area of possible ischemia versus artifact in the mid and apical anterior wall.  We discussed that this is an area of breast attenuation and given the distribution of her weight, this area may represent ischemia.  We participated in shared decision making and determined that since her epigastric pain resolved with stopping ibuprofen which we suspected it would, we will observe this at this and not proceed further with ischemic testing at this time.  She is otherwise well, in good spirits as always, and has no other concerns today.  Past Medical History:  Diagnosis Date  . Acute respiratory failure ([place])   . Acute respiratory failure with hypoxemia ([place])   . Aspiration into airway   . CAP (community acquired pneumonia) 08/05/2015  . Depression with anxiety   . Diverticulosis   . Dysphagia 03/2015    EGD, Dr Carlean Purl. mild antral gastritis, ? due to Ibuprofen.  no stricture but empirically maloney dilated esophagus.   . E. coli UTI 04/07/2015  . Elevated LDH 05/06/2015  . Encounter for routine gynecological examination 10/19/2015  . Endotracheally intubated   . Enteritis due to Clostridium difficile   . Fibroid uterus    size of a dime  . Gastrostomy infection ([place]) 11/08/2015  . Gastrostomy infection ([place]) 11/08/2015  . GERD (gastroesophageal reflux  disease)    hiatal hernia  . Hypertension    "went away when I stopped smoking"  . Knee injury   . Left hip pain 02/18/2017  . Migraine    "maybe couple times/month" (03/20/2015)  . Mild intermittent asthma 06/22/2017  . Myasthenia gravis ([place]) 2017  . Myasthenia gravis with acute exacerbation ([place]) 05/15/2015  . Osteoarthritis of left knee 12/02/2013  . Osteoarthritis of right knee 08/30/2013  . Pancreatitis 07/25/2017  . Protein-calorie malnutrition, severe ([place]) 08/07/2015  . Seasonal allergies    takes Zytrec  . Tracheostomy status ([place])   . Tubular adenoma of colon   . Tumors    "in my stomach"  . Umbilical hernia    watching , no plans for surgery at present  . VAP (ventilator-associated pneumonia) [facility])     Past Surgical History:  Procedure Laterality Date  . CESAREAN SECTION  1987; 1989  . COLONOSCOPY WITH PROPOFOL N/A 09/19/2013   Procedure: COLONOSCOPY WITH PROPOFOL;  Surgeon: Ladene Artist, MD;  Location: [facility];  Service: Endoscopy;  Laterality: N/A;  . DILATION AND CURETTAGE OF UTERUS    . ESOPHAGOGASTRODUODENOSCOPY N/A 08/10/2015   Procedure: ESOPHAGOGASTRODUODENOSCOPY (EGD);  Surgeon: Gatha Mayer, MD;  Location: [facility];  Service: Endoscopy;  Laterality: N/A;  . ESOPHAGOGASTRODUODENOSCOPY (EGD) WITH PROPOFOL N/A 03/21/2015   Procedure: ESOPHAGOGASTRODUODENOSCOPY (EGD) WITH PROPOFOL;  Surgeon: Gatha Mayer, MD;  Location: [place];  Service: Endoscopy;  Laterality: N/A;  . PARTIAL KNEE ARTHROPLASTY Right 08/30/2013   Procedure: RIGHT  UNICOMPARTMENTAL KNEE;  Surgeon: Johnny Bridge, MD;  Location: [place];  Service: Orthopedics;  Laterality: Right;  . PARTIAL KNEE ARTHROPLASTY Left 12/02/2013   Procedure: LEFT KNEE UNI ARTHROPLASTY;  Surgeon: Johnny Bridge, MD;  Location: [place];  Service: Orthopedics;  Laterality: Left;  . PEG PLACEMENT N/A 08/10/2015   Procedure: PERCUTANEOUS ENDOSCOPIC GASTROSTOMY (PEG) PLACEMENT;  Surgeon: Gatha Mayer, MD;  Location: [place];  Service: Endoscopy;  Laterality: N/A;  . TUBAL LIGATION  1989  . VAGINAL HYSTERECTOMY  1990's?   "apparently took out one of my ovaries at the time too cause one's missing"    Current Medications: Current Meds  Medication Sig  . albuterol (PROVENTIL) (2.5 MG/3ML) 0.083% nebulizer solution USE 1 VIAL IN NEBULIZER EVERY 6 HOURS AS NEEDED FOR SHORTNESS OF BREATH AND WHEEZING  . albuterol (VENTOLIN HFA) 108 (90 Base) MCG/ACT inhaler INHALE 2 PUFFS INTO THE LUNGS EVERY 6 HOURS AS NEEDED FOR WHEEZE OR SHORTNESS OF BREATH  . benzonatate (TESSALON) 100 MG capsule TAKE 1 CAPSULE BY MOUTH TWICE A DAY AS NEEDED FOR COUGH  . cetirizine (ZYRTEC) 10 MG tablet TAKE 1 TABLET BY MOUTH EVERY DAY  . clonazePAM (KLONOPIN) 0.5 MG tablet TAKE 1 TABLET BY MOUTH TWICE A DAY AS NEEDED FOR ANXIETY  . docusate sodium (COLACE) 100 MG capsule Take 1 capsule (100 mg total) by mouth 2 (two) times daily as needed for mild constipation or moderate constipation.  . ferrous sulfate 325 (65 FE) MG tablet TAKE 1 TABLET BY MOUTH EVERY DAY WITH BREAKFAST  . fluticasone (FLONASE) 50 MCG/ACT nasal spray Place 2 sprays into both nostrils daily.  [name] guaiFENesin (ROBITUSSIN) 100 MG/5ML SOLN Take 15 mLs by mouth daily as needed for cough or to loosen phlegm.  [name] LACTOBACILLUS PO Take 1 tablet by mouth.  . meloxicam (MOBIC) 15 MG tablet TAKE 1 TABLET BY MOUTH EVERY DAY  . Menthol (COUGH DROPS) 10 MG LOZG Take 1 lozenge by mouth daily as needed (cough).  . montelukast (SINGULAIR) 10 MG tablet Take 1 tablet (10 mg total) by mouth at bedtime.  . mycophenolate (CELLCEPT) 500 MG tablet Take 1,000 mg by mouth 2 (two) times daily.  [name] omeprazole (PRILOSEC) 20 MG capsule Take 1 capsule (20 mg total) by mouth daily.  . sertraline (ZOLOFT) 50 MG tablet TAKE 3 TABLETS BY MOUTH EVERY DAY     Allergies:   Ciprofloxacin, Naproxen, Shrimp [shellfish allergy], Dicyclomine, Methocarbamol, Penicillins, and Morphine  and related   Social History   Socioeconomic History  . Marital status: Married    Spouse name: Not on file  . Number of children: 3  . Years of education: Not on file  . Highest education level: Not on file  Occupational History  . Occupation: disabled  Tobacco Use  . Smoking status: Former Smoker    Packs/day: 1.50    Years: 38.00    Pack years: 57.00    Types: Cigarettes    Start date: 03/03/1974    Quit date: 05/14/2012    Years since quitting: 7.0  . Smokeless tobacco: Never Used  . Tobacco comment: denies thoughts of restarting  Substance and Sexual Activity  . Alcohol use: No    Alcohol/week: 0.0 standard drinks  . Drug use: No  . Sexual activity: Not Currently    Birth control/protection: Surgical  Other Topics Concern  . Not on file  Social History Narrative   Work or School: [employer]  Home Situation: lives with husband and daughters      Spiritual Beliefs: Christian      Lifestyle: no regular exercise; trying to eat healthy      [facility] (09/11/16):   Originally from [facility]. Has always lived in [place]. No pets currently. Does have carpet in her home in her bedroom. No bird exposure. Previously had mold under her kitchen sink. No indoor plants. Previously was working in [employer].          Social Determinants of Health   Financial Resource Strain:   . Difficulty of Paying Living Expenses:   Food Insecurity:   . Worried About Charity fundraiser in the Last Year:   . Arboriculturist in the Last Year:   Transportation Needs:   . Film/video editor (Medical):   [name] Lack of Transportation (Non-Medical):   Physical Activity: Unknown  . Days of Exercise per Week: 3 days  . Minutes of Exercise per Session: Not on file  Stress:   . Feeling of Stress :   Social Connections:   . Frequency of Communication with Friends and Family:   . Frequency of Social Gatherings with Friends and Family:   . Attends Religious  Services:   . Active Member of Clubs or Organizations:   . Attends Archivist Meetings:   [name] Marital Status:      Family History: The patient's family history includes Anemia in her sister; COPD in her brother; Heart disease (age of onset: 49) in her mother; Hypertension in her father and mother; Leukemia in her sister and sister; Other in her sister; Stroke in her father. There is no history of Colon cancer.  ROS:   Please see the history of present illness.    All other systems reviewed and are negative.  EKGs/Labs/Other Studies Reviewed:    The following studies were reviewed today:  EKG: Not performed today  Recent Labs: 05/02/2019: ALT 12; BUN 9; Creatinine, Ser 0.68; Hemoglobin 11.9; Platelets 248; Potassium 4.2; Sodium 142; TSH 3.690  Recent Lipid Panel    Component Value Date/Time   CHOL 161 07/25/2017 1846   TRIG 44 07/25/2017 1846   HDL 60 07/25/2017 1846   CHOLHDL 2.7 07/25/2017 1846   VLDL 9 07/25/2017 1846   LDLCALC 92 07/25/2017 1846    Physical Exam:    VS:  BP 126/84 (BP Location: Left Arm, Patient Position: Sitting, Cuff Size: Large)   Pulse 74   Temp (!) 97 F (36.1 C)   Ht 5' (1.524 m)   Wt (!) 328 lb (148.8 kg)   BMI 64.06 kg/m     Wt Readings from Last 5 Encounters:  05/30/19 (!) 328 lb (148.8 kg)  05/27/19 (!) 326 lb 12.8 oz (148.2 kg)  05/02/19 (!) 323 lb 6 oz (146.7 kg)  04/19/19 (!) 329 lb (149.2 kg)  04/04/19 (!) 329 lb 6.4 oz (149.4 kg)     Constitutional: No acute distress Eyes: sclera non-icteric, normal conjunctiva and lids ENMT: Mask in place Cardiovascular: regular rhythm, normal rate, no murmurs. S1 and S2 normal. Radial pulses normal bilaterally. No jugular venous distention.  Respiratory: clear to auscultation bilaterally GI : normal bowel sounds, soft and nontender. No distention.   MSK: extremities warm, well perfused. No edema.  NEURO: grossly nonfocal exam, moves all extremities. PSYCH: alert and oriented x 3,  normal mood and affect.   ASSESSMENT:    1. Chest pain, unspecified type   2. Shortness  of breath   3. Morbid obesity (HCC)   4. Epigastric pain   5. Essential hypertension   6. OSA on CPAP   7. Myasthenia gravis ([place])    PLAN:    Chest pain, unspecified type Epigastric pain Shortness of breath -Stress test was overall low risk.  We discussed this in detail and determined that it is likely the abnormalities on her stress test represent artifact, however if she has any recurrence of symptoms, we will pursue defining her coronary anatomy given her risk factors.  Morbid obesity (HCC)-continues to address with diet lifestyle modification.  Essential hypertension-not currently on antihypertensive therapy.  Blood pressure well controlled today, observe  OSA on CPAP-encourage compliance  Myasthenia gravis (HCC)-stable per PCP  Total time of encounter: 30 minutes total time of encounter, including 18 minutes spent in face-to-face patient care on the date of this encounter. This time includes coordination of care and counseling regarding above mentioned problem list. Remainder of non-face-to-face time involved reviewing chart documents/testing relevant to the patient encounter and documentation in the medical record. I have independently reviewed documentation from referring provider.   Cherlynn Kaiser, MD [place]  [facility]    Medication Adjustments/Labs and Tests Ordered: Current medicines are reviewed at length with the patient today.  Concerns regarding medicines are outlined above.  No orders of the defined types were placed in this encounter.  No orders of the defined types were placed in this encounter.   Patient Instructions  Medication Instructions:  No changes   *If you need a refill on your cardiac medications before your next appointment, please call your pharmacy*   Lab Work: Not needed   Testing/Procedures: Not needed  Follow-Up: At [facility], you and your health needs are our priority.  As part of our continuing mission to provide you with exceptional heart care, we have created designated Provider Care Teams.  These Care Teams include your primary Cardiologist (physician) and Advanced Practice Providers (APPs -  Physician Assistants and Nurse Practitioners) who all work together to provide you with the care you need, when you need it.    Your next appointment:   6 month(s)  The format for your next appointment:   In Person  Provider:   Dr Cherlynn Kaiser

## 2019-05-30 NOTE — Patient Instructions (Signed)
Medication Instructions:  No changes   *If you need a refill on your cardiac medications before your next appointment, please call your pharmacy*   Lab Work: Not needed   Testing/Procedures: Not needed  Follow-Up: At [facility], you and your health needs are our priority.  As part of our continuing mission to provide you with exceptional heart care, we have created designated Provider Care Teams.  These Care Teams include your primary Cardiologist (physician) and Advanced Practice Providers (APPs -  Physician Assistants and Nurse Practitioners) who all work together to provide you with the care you need, when you need it.    Your next appointment:   6 month(s)  The format for your next appointment:   In Person  Provider:   Dr Cherlynn Kaiser

## 2019-05-30 NOTE — Telephone Encounter (Signed)
Attempted to reach pt. No answer. LVM for pt to call office to make a virtual appt with Dr. Gwendlyn Deutscher. To discuss Mobic side effects. Salvatore Marvel, CMA

## 2019-05-31 ENCOUNTER — Ambulatory Visit: Payer: BC Managed Care – PPO

## 2019-05-31 NOTE — Chronic Care Management (AMB) (Signed)
  Care Management   Outreach Note  05/31/2019 Name: Frances Medina MRN: [id number] DOB: 03/24/1960  Referred by: [name], MD Reason for referral : Care Coordination (Care Management RNCM RLD/ Obesity )   An unsuccessful telephone outreach was attempted today. The patient was referred to the case management team for assistance with care management and care coordination.   Follow Up Plan: A HIPPA compliant phone message was left for the patient providing contact information and requesting a return call.  The care management team will reach out to the patient again over the next 7 days.   [name] RN, BSN, [facility] Care Management Coordinator [place] Phone: [phone] Fax: [phone]

## 2019-06-03 ENCOUNTER — Telehealth: Payer: BC Managed Care – PPO

## 2019-06-09 ENCOUNTER — Ambulatory Visit: Payer: BC Managed Care – PPO

## 2019-06-09 ENCOUNTER — Other Ambulatory Visit: Payer: Self-pay

## 2019-06-09 NOTE — Chronic Care Management (AMB) (Signed)
  Care Management   Outreach Note  06/09/2019 Name: Frances Medina MRN: [id number] DOB: September 15, 1960  Referred by: [name], MD Reason for referral : Care Coordination (Care Management RNCM RLD/ Obesity  2 nd attempt )   A second unsuccessful telephone outreach was attempted today. The patient was referred to the case management team for assistance with care management and care coordination.   Follow Up Plan: A HIPPA compliant phone message was left for the patient providing contact information and requesting a return call.  The care management team will reach out to the patient again over the next 5-7 days.   [name] RN, BSN, [facility] Care Management Coordinator [place] Phone: [phone] Fax: [phone]

## 2019-06-16 ENCOUNTER — Ambulatory Visit: Payer: Self-pay

## 2019-06-16 ENCOUNTER — Other Ambulatory Visit: Payer: Self-pay

## 2019-06-16 ENCOUNTER — Ambulatory Visit: Payer: BC Managed Care – PPO

## 2019-06-16 NOTE — Chronic Care Management (AMB) (Signed)
  Care Management   Outreach Note  06/16/2019 Name: Frances Medina MRN: [id number] DOB: 02/04/1961  Referred by: [name], MD Reason for referral : Care Coordination (Care Management  RNCM RLD/ Obesity  3rd attempt)   Third unsuccessful telephone outreach was attempted today. The patient was referred to the case management team for assistance with care management and care coordination. The patient's primary care provider has been notified of our unsuccessful attempts to make or maintain contact with the patient. The care management team is pleased to engage with this patient at any time in the future should he/she be interested in assistance from the care management team.   Follow Up Plan: The patient has been provided with contact information for the care management team and has been advised to call with any health related questions or concerns.  The care management team is available to follow up with the patient after provider conversation with the patient regarding recommendation for care management engagement and subsequent re-referral to the care management team.   [name] RN, BSN, [facility] Care Management Coordinator [place] Phone: [phone] Fax: [phone]

## 2019-06-16 NOTE — Chronic Care Management (AMB) (Signed)
Care Management   Follow Up Note   06/16/2019 Name: Frances Medina MRN: [id number] DOB: Dec 06, 1960  Referred by: [name], MD Reason for referral : Care Coordination (Care Management RNCM RLD/Obesity 3 rd attempt)   Frances Medina is a 59 y.o. year old female who is a primary care patient of [name], MD. The care management team was consulted for assistance with care management and care coordination needs.    Review of patient status, including review of consultants reports, relevant laboratory and other test results, and collaboration with appropriate care team members and the patient's provider was performed as part of comprehensive patient evaluation and provision of chronic care management services.    SDOH (Social Determinants of Health) assessments performed: No See Care Plan activities for detailed interventions related to [facility])     Advanced Directives: See Care Plan and Vynca application for related entries.   Goals Addressed            This Visit's Progress   . " I want to lose weight to help me breath better" (pt-stated)       Current Barriers:  [name] Knowledge Deficits related to weight loss strategies as evidenced by weight gain reported weight 320 lbs. . Chronic Disease Management support and education needs related to weight management in patient with  OSA/Restrictive Lung Diease Nurse Case Manager Clinical Goal(s):  [name] Over the next 30 days, patient will verbalize basic understanding of plan for initiation of weight loss strategies . Over the next 90 days, patient will demonstrate improved health management independence as evidenced by keeping daily food log/diary and increasing daily water intake (patient does not have provider prescribed fluid restriction) . Over the next 30 days, patient will work with RN Case Manager to address needs related to any barriers that she may have related to weight loss  Interventions: 03/22/19 . Evaluation of current  treatment plan related to weight loss and patient's adherence to plan as established by provider . Provided verbal and/or written education to patient re: recommended life style changes: avoid fad diets, make small/incremental dietary and light exercise changes( IE.., walk around her kitchen table, sit in a chair and exercise her arms with  small water bottles for a few minutes a day.), eat at the table and avoid eating in front of the TV, plan management of cravings, monitor snacking and cravings in food diary . Discussed plans with patient for ongoing care management follow up and provided patient with direct contact information for care management team . Reduces sodium in her diet, discussed the effects of sodium on the body. . Monitor fried foods . Weigh monthly . Be realistic on weight to lose.  Patient states 1-2 lbs to start.  . 04/25/19 . Patient returned call and stated that she has been out of the home going to appointment and missing my calls . She states that she is still working on her meals to lose weight.  We discussed calories per day and discussed a 2000 calorie/day meal plan.  I sent her a 2000 calorie meal plan to look at  to get ideas. . She states that she eats a lot of salad and drinks water. I advised she needs to have a variety of foods so she will not get bored with the same thing  and try to have healthy choices. . As far as exercise, the main thing is chores that she does in the home.  I advised her not to  worry about the exercise as much but to focus on eating healthy.  Not to over exert herself.  Patient verbalized understanding. . We have scheduled a face to face appointment but she will call and let me know if she will be able to keep the appointment due to transportation issues. 05/16/19 o Patient states that she is feeling better from her visit in the office on 3/1 denies any symptoms currently. o She states that she has no appetite, no swelling but has shortness of  breath "off and on"   o She is having some constipation but is taking a stool softener and drinking water  o She states that she has some nausea.  We discussed her medications and she take her Iron pill on an empty stomach.  I advised that sometimes iron pills can make you nauseated.  She should eat something when takin the pill and drinking orange juice will help absorption. o She continues to take her medications as prescribed. o Patient states that she is exercising, she has a floor foot bike that she is using with bands for her arms.  She is doing 15-minute intervals three times a day.  Told the patient not to over do but that it is a wonderful way to get her exercise in. o 06/16/19 o Spoke with th epatient today and she states that she is doing well  o She states that she feel very top heavy and it causes her to be shob.  She states that her bra size has gone from a 44 DD to 52 H.  She been thinking about having a breast reduction.  She said when she lifts up her breast if makes a big difference.  She has a virtual visit with Dr Gwendlyn Deutscher tomorrow  and she is going to talk with her about it. o She states that last time she weighed herself it was at the [facility] doctors and it was 326 lbs. o She states that she only eats salads and vegetables and can't understand why she has the weight. o She is schedule to have colonoscopy and EGD on may 4th.    Patient Self Care Activities:  . Performs ADL's independently . Calls provider office for new concerns or questions . Unable to independently self manage weight loss  Please see past updates related to this goal by clicking on the "Past Updates" button in the selected goal          The care management team will reach out to the patient again over the next 14 days.  The patient has been provided with contact information for the care management team and has been advised to call with any health related questions or concerns.   [name] RN, BSN, [facility] Care  Management Coordinator [place] Phone: [phone] Fax: [phone]

## 2019-06-17 ENCOUNTER — Telehealth: Payer: BC Managed Care – PPO

## 2019-06-17 ENCOUNTER — Other Ambulatory Visit: Payer: Self-pay

## 2019-06-17 ENCOUNTER — Encounter: Payer: Self-pay | Admitting: Family Medicine

## 2019-06-17 ENCOUNTER — Telehealth (INDEPENDENT_AMBULATORY_CARE_PROVIDER_SITE_OTHER): Payer: BC Managed Care – PPO | Admitting: Family Medicine

## 2019-06-17 DIAGNOSIS — G43809 Other migraine, not intractable, without status migrainosus: Secondary | ICD-10-CM

## 2019-06-17 DIAGNOSIS — R0602 Shortness of breath: Secondary | ICD-10-CM

## 2019-06-17 DIAGNOSIS — N62 Hypertrophy of breast: Secondary | ICD-10-CM | POA: Insufficient documentation

## 2019-06-17 NOTE — Patient Instructions (Signed)

## 2019-06-17 NOTE — Assessment & Plan Note (Signed)
Mobic is working. I discussed potential interaction of NSAID with Zoloft/SSRI I.e increase bleeding risk. Unclear if it increases risk of serotonin syndrome. SHe denies GI bleed or GI symptoms. She will monitor closely for side effects.

## 2019-06-17 NOTE — Assessment & Plan Note (Signed)
She is compliant with good diet. F/U with [facility] Associate for weight loss surgery.

## 2019-06-17 NOTE — Progress Notes (Signed)
    SUBJECTIVE:   CHIEF COMPLAINT / HPI:  SOB/Large breast: Patient still having issues with her breathing. She feels this is from her heavy breast weighing on her chest. Her bra size has increased in the last few months. Weight:The patient stated despite good diet she is still not losing weight. Her bypass surgery was postponed due to COVID-19 pandemic. Migraine: Uses Mobic daily with improvement. Appointment made to discuss Mobic and Zoloft use for depression. HM:She is scheduled for EGD and Colonoscopy. Also to f/u on PAP   PERTINENT  PMH / PSH: PMX reviewed.  OBJECTIVE:   There were no vitals taken for this visit.  Physical Exam Constitutional:      Appearance: She is obese.  HENT:     Head: Normocephalic.  Pulmonary:     Effort: Pulmonary effort is normal. No respiratory distress.  Neurological:     Mental Status: She is alert.      ASSESSMENT/PLAN:   Shortness of breath Multifactorial - Obesity, Myasthenia Gravis, Restrictive lung disease (Large breast/chest wall heaviness) As discussed with her, weight loss will help with her breathing. Awaiting final weight loss surgery approval. We will also explore breat reduction surgery. Continue current regimen for MG.   Macromastia Large breast Impeding her breathing. I referred her to Gen surg for breast reduction. She is interested in getting breast reduction. She is already established with [place] [facility] for bypass surgery, hence I referred her to them for breast reduction as well.  Morbid obesity ([place]) She is compliant with good diet. F/U with [facility] for weight loss surgery.  Headache, migraine Mobic is working. I discussed potential interaction of NSAID with Zoloft/SSRI I.e increase bleeding risk. Unclear if it increases risk of serotonin syndrome. SHe denies GI bleed or GI symptoms. She will monitor closely for side effects.    Health maintenance: She is scheduled for colonoscopy. I  discussed her most recent PAP report done about three years ago which was normal. Since she had co-testing done, I discussed she cn get PAP done soon or defer to a total of 5 years. She prefers to defer PAP for later since she has other health concerns she is dealing with. This is reasonable. I will adjust her HM to 5 yearly PAP. Andrena Mews, MD [place]

## 2019-06-17 NOTE — Assessment & Plan Note (Signed)
Large breast Impeding her breathing. I referred her to Gen surg for breast reduction. She is interested in getting breast reduction. She is already established with [place] [facility] for bypass surgery, hence I referred her to them for breast reduction as well.

## 2019-06-17 NOTE — Assessment & Plan Note (Signed)
Multifactorial - Obesity, Myasthenia Gravis, Restrictive lung disease (Large breast/chest wall heaviness) As discussed with her, weight loss will help with her breathing. Awaiting final weight loss surgery approval. We will also explore breat reduction surgery. Continue current regimen for MG.

## 2019-06-17 NOTE — H&P (View-Only) (Signed)
    SUBJECTIVE:   CHIEF COMPLAINT / HPI:  SOB/Large breast: Patient still having issues with her breathing. She feels this is from her heavy breast weighing on her chest. Her bra size has increased in the last few months. Weight:The patient stated despite good diet she is still not losing weight. Her bypass surgery was postponed due to COVID-19 pandemic. Migraine: Uses Mobic daily with improvement. Appointment made to discuss Mobic and Zoloft use for depression. HM:She is scheduled for EGD and Colonoscopy. Also to f/u on PAP   PERTINENT  PMH / PSH: PMX reviewed.  OBJECTIVE:   There were no vitals taken for this visit.  Physical Exam Constitutional:      Appearance: She is obese.  HENT:     Head: Normocephalic.  Pulmonary:     Effort: Pulmonary effort is normal. No respiratory distress.  Neurological:     Mental Status: She is alert.      ASSESSMENT/PLAN:   Shortness of breath Multifactorial - Obesity, Myasthenia Gravis, Restrictive lung disease (Large breast/chest wall heaviness) As discussed with her, weight loss will help with her breathing. Awaiting final weight loss surgery approval. We will also explore breat reduction surgery. Continue current regimen for MG.   Macromastia Large breast Impeding her breathing. I referred her to Gen surg for breast reduction. She is interested in getting breast reduction. She is already established with [place] [facility] for bypass surgery, hence I referred her to them for breast reduction as well.  Morbid obesity ([place]) She is compliant with good diet. F/U with [facility] for weight loss surgery.  Headache, migraine Mobic is working. I discussed potential interaction of NSAID with Zoloft/SSRI I.e increase bleeding risk. Unclear if it increases risk of serotonin syndrome. SHe denies GI bleed or GI symptoms. She will monitor closely for side effects.    Health maintenance: She is scheduled for colonoscopy. I  discussed her most recent PAP report done about three years ago which was normal. Since she had co-testing done, I discussed she cn get PAP done soon or defer to a total of 5 years. She prefers to defer PAP for later since she has other health concerns she is dealing with. This is reasonable. I will adjust her HM to 5 yearly PAP. Andrena Mews, MD [place]

## 2019-06-30 ENCOUNTER — Other Ambulatory Visit: Payer: Self-pay

## 2019-06-30 ENCOUNTER — Telehealth: Payer: Self-pay | Admitting: Physician Assistant

## 2019-06-30 ENCOUNTER — Ambulatory Visit: Payer: BC Managed Care – PPO

## 2019-06-30 NOTE — Patient Instructions (Signed)
Visit Information  Goals Addressed            This Visit's Progress   . " I want to lose weight to help me breath better" (pt-stated)       Current Barriers:  [name] Knowledge Deficits related to weight loss strategies as evidenced by weight gain reported weight 320 lbs. . Chronic Disease Management support and education needs related to weight management in patient with  OSA/Restrictive Lung Diease Nurse Case Manager Clinical Goal(s):  [name] Over the next 30 days, patient will verbalize basic understanding of plan for initiation of weight loss strategies . Over the next 90 days, patient will demonstrate improved health management independence as evidenced by keeping daily food log/diary and increasing daily water intake (patient does not have provider prescribed fluid restriction) . Over the next 30 days, patient will work with RN Case Manager to address needs related to any barriers that she may have related to weight loss  Interventions: 03/22/19 . Evaluation of current treatment plan related to weight loss and patient's adherence to plan as established by provider . Provided verbal and/or written education to patient re: recommended life style changes: avoid fad diets, make small/incremental dietary and light exercise changes( IE.., walk around her kitchen table, sit in a chair and exercise her arms with  small water bottles for a few minutes a day.), eat at the table and avoid eating in front of the TV, plan management of cravings, monitor snacking and cravings in food diary . Discussed plans with patient for ongoing care management follow up and provided patient with direct contact information for care management team . Reduces sodium in her diet, discussed the effects of sodium on the body. . Monitor fried foods . Weigh monthly . Be realistic on weight to lose.  Patient states 1-2 lbs to start.  . 04/25/19 . Patient returned call and stated that she has been out of the home going to  appointment and missing my calls . She states that she is still working on her meals to lose weight.  We discussed calories per day and discussed a 2000 calorie/day meal plan.  I sent her a 2000 calorie meal plan to look at  to get ideas. . She states that she eats a lot of salad and drinks water. I advised she needs to have a variety of foods so she will not get bored with the same thing  and try to have healthy choices. . As far as exercise, the main thing is chores that she does in the home.  I advised her not to worry about the exercise as much but to focus on eating healthy.  Not to over exert herself.  Patient verbalized understanding. . We have scheduled a face to face appointment but she will call and let me know if she will be able to keep the appointment due to transportation issues. 05/16/19 o Patient states that she is feeling better from her visit in the office on 3/1 denies any symptoms currently. o She states that she has no appetite, no swelling but has shortness of breath "off and on"   o She is having some constipation but is taking a stool softener and drinking water  o She states that she has some nausea.  We discussed her medications and she take her Iron pill on an empty stomach.  I advised that sometimes iron pills can make you nauseated.  She should eat something when takin the pill and drinking orange  juice will help absorption. o She continues to take her medications as prescribed. o Patient states that she is exercising, she has a floor foot bike that she is using with bands for her arms.  She is doing 15-minute intervals three times a day.  Told the patient not to over do but that it is a wonderful way to get her exercise in. o 06/16/19 o Spoke with th epatient today and she states that she is doing well  o She states that she feel very top heavy and it causes her to be shob.  She states that her bra size has gone from a 44 DD to 52 H.  She been thinking about having a breast  reduction.  She said when she lifts up her breast if makes a big difference.  She has a virtual visit with Dr Gwendlyn Deutscher tomorrow  and she is going to talk with her about it. o She states that last time she weighed herself [facility] was [facility] and it was 326 lbs. o She states that she only eats salads and vegetables and can't understand why she has the weight. o She is schedule to have colonoscopy and EGD on may 4th.   o 06/30/19 o Patient states that she is tired has little energy  o She is waiting to have her colonoscopy and EGD on the 4th of May o She states that she talked with Dr. Gwendlyn Deutscher about her breast and the heaviness that she feels .  She states that Dr . Gwendlyn Deutscher will mention it to [facility] surgery  o The patient states that her weight has stayed the same at 326.6 o She is just eating vegetables, some rice, drinking protein drinks, and water.  Patient Self Care Activities:  . Performs ADL's independently . Calls provider office for new concerns or questions . Unable to independently self manage weight loss  Please see past updates related to this goal by clicking on the "Past Updates" button in the selected goal         Frances Medina was given information about Care Management services today including:  1. Care Management services include personalized support from designated clinical staff supervised by her physician, including individualized plan of care and coordination with other care providers 2. 24/7 contact phone numbers for assistance for urgent and routine care needs. 3. The patient may stop CCM services at any time (effective at the end of the month) by phone call to the office staff.  Patient agreed to services and verbal consent obtained.   The patient verbalized understanding of instructions provided today and declined a print copy of patient instruction materials.   The care management team will reach out to the patient again over the next 14 days.  The patient has  been provided with contact information for the care management team and has been advised to call with any health related questions or concerns.   [name] RN, BSN, [facility] Care Management Coordinator [place] Phone: [phone] Fax: [phone]

## 2019-06-30 NOTE — Chronic Care Management (AMB) (Signed)
Care Management   Follow Up Note   06/30/2019 Name: Frances Medina MRN: [id number] DOB: [birth year]  Referred by: [name], MD Reason for referral : Care Coordination ([place] 14 day f/u)   Frances Medina is a 59 y.o. year old female who is a primary care patient of [name], MD. The care management team was consulted for assistance with care management and care coordination needs.    Review of patient status, including review of consultants reports, relevant laboratory and other test results, and collaboration with appropriate care team members and the patient's provider was performed as part of comprehensive patient evaluation and provision of chronic care management services.    SDOH (Social Determinants of Health) assessments performed: No See Care Plan activities for detailed interventions related to [facility])     Advanced Directives: See Care Plan and Vynca application for related entries.   Goals Addressed            This Visit's Progress   . " I want to lose weight to help me breath better" (pt-stated)       Current Barriers:  [name] Knowledge Deficits related to weight loss strategies as evidenced by weight gain reported weight 320 lbs. . Chronic Disease Management support and education needs related to weight management in patient with  OSA/Restrictive Lung Diease Nurse Case Manager Clinical Goal(s):  [name] Over the next 30 days, patient will verbalize basic understanding of plan for initiation of weight loss strategies . Over the next 90 days, patient will demonstrate improved health management independence as evidenced by keeping daily food log/diary and increasing daily water intake (patient does not have provider prescribed fluid restriction) . Over the next 30 days, patient will work with RN Case Manager to address needs related to any barriers that she may have related to weight loss  Interventions: 03/22/19 . Evaluation of current treatment plan  related to weight loss and patient's adherence to plan as established by provider . Provided verbal and/or written education to patient re: recommended life style changes: avoid fad diets, make small/incremental dietary and light exercise changes( IE.., walk around her kitchen table, sit in a chair and exercise her arms with  small water bottles for a few minutes a day.), eat at the table and avoid eating in front of the TV, plan management of cravings, monitor snacking and cravings in food diary . Discussed plans with patient for ongoing care management follow up and provided patient with direct contact information for care management team . Reduces sodium in her diet, discussed the effects of sodium on the body. . Monitor fried foods . Weigh monthly . Be realistic on weight to lose.  Patient states 1-2 lbs to start.  . 04/25/19 . Patient returned call and stated that she has been out of the home going to appointment and missing my calls . She states that she is still working on her meals to lose weight.  We discussed calories per day and discussed a 2000 calorie/day meal plan.  I sent her a 2000 calorie meal plan to look at  to get ideas. . She states that she eats a lot of salad and drinks water. I advised she needs to have a variety of foods so she will not get bored with the same thing  and try to have healthy choices. . As far as exercise, the main thing is chores that she does in the home.  I advised her not to worry  about the exercise as much but to focus on eating healthy.  Not to over exert herself.  Patient verbalized understanding. . We have scheduled a face to face appointment but she will call and let me know if she will be able to keep the appointment due to transportation issues. 05/16/19 o Patient states that she is feeling better from her visit in the office on 3/1 denies any symptoms currently. o She states that she has no appetite, no swelling but has shortness of breath "off and  on"   o She is having some constipation but is taking a stool softener and drinking water  o She states that she has some nausea.  We discussed her medications and she take her Iron pill on an empty stomach.  I advised that sometimes iron pills can make you nauseated.  She should eat something when takin the pill and drinking orange juice will help absorption. o She continues to take her medications as prescribed. o Patient states that she is exercising, she has a floor foot bike that she is using with bands for her arms.  She is doing 15-minute intervals three times a day.  Told the patient not to over do but that it is a wonderful way to get her exercise in. o 06/16/19 o Spoke with th epatient today and she states that she is doing well  o She states that she feel very top heavy and it causes her to be shob.  She states that her bra size has gone from a 44 DD to 52 H.  She been thinking about having a breast reduction.  She said when she lifts up her breast if makes a big difference.  She has a virtual visit with Dr Gwendlyn Deutscher tomorrow  and she is going to talk with her about it. o She states that last time she weighed herself it was at the [facility] and it was 326 lbs. o She states that she only eats salads and vegetables and can't understand why she has the weight. o She is schedule to have colonoscopy and EGD on may 4th.   o 06/30/19 o Patient states that she is tired has little energy  o She is waiting to have her colonoscopy and EGD on the 4th of May o She states that she talked with Dr. Gwendlyn Deutscher about her breast and the heaviness that she feels .  She states that Dr . Gwendlyn Deutscher will mention it to [name] [facility]  o The patient states that her weight has stayed the same at 326.6 o She is just eating vegetables, some rice, drinking protein drinks, and water.  Patient Self Care Activities:  . Performs ADL's independently . Calls provider office for new concerns or questions . Unable to independently  self manage weight loss  Please see past updates related to this goal by clicking on the "Past Updates" button in the selected goal          The care management team will reach out to the patient again over the next 14 days.  The patient has been provided with contact information for the care management team and has been advised to call with any health related questions or concerns.   [name] RN, BSN, [facility] Care Management Coordinator [place] Phone: [phone] Fax: [phone]

## 2019-07-01 ENCOUNTER — Other Ambulatory Visit (HOSPITAL_COMMUNITY)
Admission: RE | Admit: 2019-07-01 | Discharge: 2019-07-01 | Disposition: A | Discharge status: Home / Self Care | Payer: BC Managed Care – PPO | Source: Ambulatory Visit | Attending: Gastroenterology | Admitting: Gastroenterology

## 2019-07-01 DIAGNOSIS — Z01812 Encounter for preprocedural laboratory examination: Secondary | ICD-10-CM | POA: Insufficient documentation

## 2019-07-01 DIAGNOSIS — Z20822 Contact with and (suspected) exposure to covid-19: Secondary | ICD-10-CM | POA: Diagnosis not present

## 2019-07-01 LAB — SARS CORONAVIRUS 2 (TAT 6-24 HRS): SARS Coronavirus 2: NEGATIVE

## 2019-07-01 MED ORDER — NA SULFATE-K SULFATE-MG SULF 17.5-3.13-1.6 GM/177ML PO SOLN: 1.0000 | Freq: Once | ORAL | 0 refills | Status: AC

## 2019-07-01 NOTE — Telephone Encounter (Signed)
Patient has been advised that suprep has been resent to pharmacy with discount code.

## 2019-07-04 NOTE — Anesthesia Preprocedure Evaluation (Addendum)
Anesthesia Evaluation  Patient identified by MRN, date of birth, ID band Patient awake    Reviewed: Allergy & Precautions, NPO status , Patient's Chart, lab work & pertinent test results  History of Anesthesia Complications Negative for: history of anesthetic complications  Airway Mallampati: II  TM Distance: >3 FB Neck ROM: Full    Dental  (+) Dental Advisory Given, Partial Lower, Partial Upper   Pulmonary neg pulmonary ROS, asthma , sleep apnea and Continuous Positive Airway Pressure Ventilation , former smoker,    Pulmonary exam normal        Cardiovascular hypertension, Normal cardiovascular exam     Neuro/Psych  Headaches, PSYCHIATRIC DISORDERS Anxiety Depression  Myasthenia gravis  negative psych ROS   GI/Hepatic Neg liver ROS, GERD  Medicated and Controlled,  Endo/Other  Morbid obesity   Renal/GU negative Renal ROS     Musculoskeletal negative musculoskeletal ROS (+) Arthritis ,   Abdominal (+) + obese,   Peds  Hematology negative hematology ROS (+)   Anesthesia Other Findings   Reproductive/Obstetrics                            Anesthesia Physical Anesthesia Plan  ASA: IV  Anesthesia Plan: General   Post-op Pain Management:    Induction: Intravenous  PONV Risk Score and Plan: 2 and Propofol infusion and Treatment may vary due to age or medical condition  Airway Management Planned: Oral ETT  Additional Equipment: None  Intra-op Plan:   Post-operative Plan: Extubation in OR  Informed Consent: I have reviewed the patients History and Physical, chart, labs and discussed the procedure including the risks, benefits and alternatives for the proposed anesthesia with the patient or authorized representative who has indicated his/her understanding and acceptance.     Dental advisory given  Plan Discussed with: CRNA and Anesthesiologist  Anesthesia Plan Comments:         Anesthesia Quick Evaluation

## 2019-07-05 ENCOUNTER — Ambulatory Visit (HOSPITAL_COMMUNITY): Payer: BC Managed Care – PPO | Admitting: Certified Registered"

## 2019-07-05 ENCOUNTER — Ambulatory Visit (HOSPITAL_COMMUNITY)
Admission: RE | Admit: 2019-07-05 | Discharge: 2019-07-05 | Disposition: A | Discharge status: Home / Self Care | Payer: BC Managed Care – PPO | Attending: Gastroenterology | Admitting: Gastroenterology

## 2019-07-05 ENCOUNTER — Other Ambulatory Visit: Payer: Self-pay

## 2019-07-05 ENCOUNTER — Encounter (HOSPITAL_COMMUNITY): Payer: Self-pay | Admitting: Gastroenterology

## 2019-07-05 ENCOUNTER — Encounter (HOSPITAL_COMMUNITY)
Admission: RE | Disposition: A | Discharge status: Home / Self Care | Payer: Self-pay | Source: Home / Self Care | Attending: Gastroenterology

## 2019-07-05 DIAGNOSIS — Z791 Long term (current) use of non-steroidal anti-inflammatories (NSAID): Secondary | ICD-10-CM | POA: Diagnosis not present

## 2019-07-05 DIAGNOSIS — Z860100 Personal history of colon polyps, unspecified: Secondary | ICD-10-CM

## 2019-07-05 DIAGNOSIS — G7000 Myasthenia gravis without (acute) exacerbation: Secondary | ICD-10-CM | POA: Diagnosis not present

## 2019-07-05 DIAGNOSIS — G43909 Migraine, unspecified, not intractable, without status migrainosus: Secondary | ICD-10-CM | POA: Insufficient documentation

## 2019-07-05 DIAGNOSIS — R0602 Shortness of breath: Secondary | ICD-10-CM | POA: Diagnosis not present

## 2019-07-05 DIAGNOSIS — N62 Hypertrophy of breast: Secondary | ICD-10-CM | POA: Diagnosis not present

## 2019-07-05 DIAGNOSIS — K621 Rectal polyp: Secondary | ICD-10-CM | POA: Diagnosis not present

## 2019-07-05 DIAGNOSIS — R6881 Early satiety: Secondary | ICD-10-CM | POA: Insufficient documentation

## 2019-07-05 DIAGNOSIS — G4730 Sleep apnea, unspecified: Secondary | ICD-10-CM | POA: Insufficient documentation

## 2019-07-05 DIAGNOSIS — Z1211 Encounter for screening for malignant neoplasm of colon: Secondary | ICD-10-CM | POA: Insufficient documentation

## 2019-07-05 DIAGNOSIS — K635 Polyp of colon: Secondary | ICD-10-CM | POA: Insufficient documentation

## 2019-07-05 DIAGNOSIS — D124 Benign neoplasm of descending colon: Secondary | ICD-10-CM

## 2019-07-05 DIAGNOSIS — J984 Other disorders of lung: Secondary | ICD-10-CM | POA: Diagnosis not present

## 2019-07-05 DIAGNOSIS — Z79899 Other long term (current) drug therapy: Secondary | ICD-10-CM | POA: Insufficient documentation

## 2019-07-05 DIAGNOSIS — R1013 Epigastric pain: Secondary | ICD-10-CM | POA: Insufficient documentation

## 2019-07-05 DIAGNOSIS — K2950 Unspecified chronic gastritis without bleeding: Secondary | ICD-10-CM | POA: Diagnosis not present

## 2019-07-05 DIAGNOSIS — D128 Benign neoplasm of rectum: Secondary | ICD-10-CM

## 2019-07-05 DIAGNOSIS — K219 Gastro-esophageal reflux disease without esophagitis: Secondary | ICD-10-CM

## 2019-07-05 HISTORY — PX: POLYPECTOMY: SHX5525

## 2019-07-05 HISTORY — PX: BIOPSY: SHX5522

## 2019-07-05 HISTORY — PX: ESOPHAGOGASTRODUODENOSCOPY (EGD) WITH PROPOFOL: SHX5813

## 2019-07-05 HISTORY — PX: COLONOSCOPY WITH PROPOFOL: SHX5780

## 2019-07-05 SURGERY — COLONOSCOPY WITH PROPOFOL
Anesthesia: Monitor Anesthesia Care

## 2019-07-05 MED ORDER — ALBUTEROL SULFATE HFA 108 (90 BASE) MCG/ACT IN AERS
INHALATION_SPRAY | RESPIRATORY_TRACT | Status: DC | PRN
  Administered 2019-07-05: 2 via RESPIRATORY_TRACT

## 2019-07-05 MED ORDER — ONDANSETRON HCL 4 MG/2ML IJ SOLN
INTRAMUSCULAR | Status: DC | PRN
  Administered 2019-07-05: 4 mg via INTRAVENOUS

## 2019-07-05 MED ORDER — FENTANYL CITRATE (PF) 250 MCG/5ML IJ SOLN
INTRAMUSCULAR | Status: DC | PRN
  Administered 2019-07-05 (×2): 50 ug via INTRAVENOUS

## 2019-07-05 MED ORDER — LACTATED RINGERS IV SOLN
INTRAVENOUS | Status: DC
  Administered 2019-07-05: 1000 mL via INTRAVENOUS

## 2019-07-05 MED ORDER — PROPOFOL 500 MG/50ML IV EMUL
INTRAVENOUS | Status: AC
  Filled 2019-07-05: qty 50

## 2019-07-05 MED ORDER — ALBUTEROL SULFATE HFA 108 (90 BASE) MCG/ACT IN AERS
INHALATION_SPRAY | RESPIRATORY_TRACT | Status: AC
  Filled 2019-07-05: qty 6.7

## 2019-07-05 MED ORDER — EPHEDRINE SULFATE-NACL 50-0.9 MG/10ML-% IV SOSY
PREFILLED_SYRINGE | INTRAVENOUS | Status: DC | PRN
  Administered 2019-07-05 (×6): 5 mg via INTRAVENOUS

## 2019-07-05 MED ORDER — FENTANYL CITRATE (PF) 100 MCG/2ML IJ SOLN
INTRAMUSCULAR | Status: AC
  Filled 2019-07-05: qty 2

## 2019-07-05 MED ORDER — SODIUM CHLORIDE 0.9 % IV SOLN: INTRAVENOUS | Status: DC

## 2019-07-05 MED ORDER — LIDOCAINE 2% (20 MG/ML) 5 ML SYRINGE
INTRAMUSCULAR | Status: DC | PRN
  Administered 2019-07-05: 60 mg via INTRAVENOUS

## 2019-07-05 MED ORDER — SUCCINYLCHOLINE CHLORIDE 200 MG/10ML IV SOSY
PREFILLED_SYRINGE | INTRAVENOUS | Status: DC | PRN
  Administered 2019-07-05: 160 mg via INTRAVENOUS

## 2019-07-05 MED ORDER — PROPOFOL 10 MG/ML IV BOLUS
INTRAVENOUS | Status: AC
  Filled 2019-07-05: qty 20

## 2019-07-05 MED ORDER — PROPOFOL 10 MG/ML IV BOLUS
INTRAVENOUS | Status: DC | PRN
  Administered 2019-07-05: 70 mg via INTRAVENOUS
  Administered 2019-07-05: 200 mg via INTRAVENOUS

## 2019-07-05 SURGICAL SUPPLY — 22 items

## 2019-07-05 NOTE — Anesthesia Procedure Notes (Signed)
Procedure Name: Intubation Date/Time: 07/05/2019 8:39 AM Performed by: Eben Burow, CRNA Pre-anesthesia Checklist: Patient identified, Emergency Drugs available, Suction available, Patient being monitored and Timeout performed Patient Re-evaluated:Patient Re-evaluated prior to induction Oxygen Delivery Method: Circle system utilized Preoxygenation: Pre-oxygenation with 100% oxygen Induction Type: IV induction Ventilation: Mask ventilation without difficulty Laryngoscope Size: Glidescope and 4 Grade View: Grade I Tube type: Oral Tube size: 7.0 mm Number of attempts: 1 Airway Equipment and Method: Stylet Placement Confirmation: ETT inserted through vocal cords under direct vision,  positive ETCO2 and breath sounds checked- equal and bilateral Secured at: 21 cm Tube secured with: Tape Dental Injury: Teeth and Oropharynx as per pre-operative assessment

## 2019-07-05 NOTE — Discharge Instructions (Signed)
YOU HAD AN ENDOSCOPIC PROCEDURE TODAY: Refer to the procedure report and other information in the discharge instructions given to you for any specific questions about what was found during the examination. If this information does not answer your questions, please call [place] office at [phone] to clarify.  ° °YOU SHOULD EXPECT: Some feelings of bloating in the abdomen. Passage of more gas than usual. Walking can help get rid of the air that was put into your GI tract during the procedure and reduce the bloating. If you had a lower endoscopy (such as a colonoscopy or flexible sigmoidoscopy) you may notice spotting of blood in your stool or on the toilet paper. Some abdominal soreness may be present for a day or two, also. ° °DIET: Your first meal following the procedure should be a light meal and then it is ok to progress to your normal diet. A half-sandwich or bowl of soup is an example of a good first meal. Heavy or fried foods are harder to digest and may make you feel nauseous or bloated. Drink plenty of fluids but you should avoid alcoholic beverages for 24 hours. If you had a esophageal dilation, please see attached instructions for diet.   ° °ACTIVITY: Your care partner should take you home directly after the procedure. You should plan to take it easy, moving slowly for the rest of the day. You can resume normal activity the day after the procedure however YOU SHOULD NOT DRIVE, use power tools, machinery or perform tasks that involve climbing or major physical exertion for 24 hours (because of the sedation medicines used during the test).  ° °SYMPTOMS TO REPORT IMMEDIATELY: °A gastroenterologist can be reached at any hour. Please call [phone]  for any of the following symptoms:  °Following lower endoscopy (colonoscopy, flexible sigmoidoscopy) °Excessive amounts of blood in the stool  °Significant tenderness, worsening of abdominal pains  °Swelling of the abdomen that is new, acute  °Fever of 100° or  higher  °Following upper endoscopy (EGD, EUS, ERCP, esophageal dilation) °Vomiting of blood or coffee ground material  °New, significant abdominal pain  °New, significant chest pain or pain under the shoulder blades  °Painful or persistently difficult swallowing  °New shortness of breath  °Black, tarry-looking or red, bloody stools ° °FOLLOW UP:  °If any biopsies were taken you will be contacted by phone or by letter within the next 1-3 weeks. Call [phone]  if you have not heard about the biopsies in 3 weeks.  °Please also call with any specific questions about appointments or follow up tests. ° °

## 2019-07-05 NOTE — Op Note (Signed)
[facility] Patient Name: Frances Medina Procedure Date: 07/05/2019 MRN: [id number] Attending MD: Ladene Artist , MD Date of Birth: 1960-09-28 CSN: [id number] Age: 59 Admit Type: Outpatient Procedure:                Colonoscopy Indications:              Surveillance: Personal history of adenomatous                            polyps on last colonoscopy > 5 years ago Providers:                Pricilla Riffle. Fuller Plan, MD, Elmer Ramp. Tilden Dome, RN, Marguerita Merles, Technician, Jefm Miles CRNA Referring MD:             [name], MD Medicines:                General Anesthesia Complications:            No immediate complications. Estimated Blood Loss:     Estimated blood loss was minimal. Procedure:                Pre-Anesthesia Assessment:                           - Prior to the procedure, a History and Physical                            was performed, and patient medications and                            allergies were reviewed. The patient's tolerance of                            previous anesthesia was also reviewed. The risks                            and benefits of the procedure and the sedation                            options and risks were discussed with the patient.                            All questions were answered, and informed consent                            was obtained. Prior Anticoagulants: The patient has                            taken no previous anticoagulant or antiplatelet                            agents. ASA Grade Assessment: III - A patient with  severe systemic disease. After reviewing the risks                            and benefits, the patient was deemed in                            satisfactory condition to undergo the procedure.                           After obtaining informed consent, the colonoscope                            was passed under direct vision. Throughout the                             procedure, the patient's blood pressure, pulse, and                            oxygen saturations were monitored continuously. The                            [id number] Olympus colonoscope was                            introduced through the anus and advanced to the the                            cecum, identified by appendiceal orifice and                            ileocecal valve. Scope In: 8:49:39 AM Scope Out: 9:05:23 AM Scope Withdrawal Time: 0 hours 14 minutes 23 seconds  Total Procedure Duration: 0 hours 15 minutes 43 seconds  Findings:      The perianal and digital rectal examinations were normal.      Two sessile polyps were found in the rectum and descending colon. The       polyps were 5 to 7 mm in size. These polyps were removed with a cold       snare. Resection and retrieval were complete.      The exam was otherwise normal throughout the examined colon. Impression:               - Two 5 to 7 mm polyps in the rectum and in the                            descending colon, removed with a cold snare.                            Resected and retrieved. Moderate Sedation:      Not Applicable - Patient had care per Anesthesia. Recommendation:           - Patient has a contact number available for                            emergencies. The signs  and symptoms of potential                            delayed complications were discussed with the                            patient. Return to normal activities tomorrow.                            Written discharge instructions were provided to the                            patient.                           - Resume previous diet.                           - Continue present medications.                           - Await pathology results.                           - Repeat colonoscopy after studies are complete for                            surveillance based on pathology results, likely 7                             years. Procedure Code(s):        --- Professional ---                           [phone], Colonoscopy, flexible; with removal of                            tumor(s), polyp(s), or other lesion(s) by snare                            technique Diagnosis Code(s):        --- Professional ---                           Z86.010, Personal history of colonic polyps                           K62.1, Rectal polyp                           K63.5, Polyp of colon CPT copyright 2019 American [employer] Association. All rights reserved. The codes documented in this report are preliminary and upon coder review may  be revised to meet current compliance requirements. Ladene Artist, MD 07/05/2019 9:09:41 AM This report has been signed electronically. Number of Addenda: 0

## 2019-07-05 NOTE — Anesthesia Postprocedure Evaluation (Signed)
Anesthesia Post Note  Patient: UNA ATHERHOLT  Procedure(s) Performed: COLONOSCOPY WITH PROPOFOL (N/A ) ESOPHAGOGASTRODUODENOSCOPY (EGD) WITH PROPOFOL (N/A ) POLYPECTOMY BIOPSY     Patient location during evaluation: PACU Anesthesia Type: General Level of consciousness: awake and alert Pain management: pain level controlled Vital Signs Assessment: post-procedure vital signs reviewed and stable Respiratory status: spontaneous breathing, nonlabored ventilation and respiratory function stable Cardiovascular status: blood pressure returned to baseline and stable Postop Assessment: no apparent nausea or vomiting Anesthetic complications: no    Last Vitals:  Vitals:   07/05/19 0950 07/05/19 1000  BP: (!) 141/107 (!) 149/49  Pulse: 82 85  Resp: 13 14  Temp:    SpO2: 100% 97%    Last Pain:  Vitals:   07/05/19 1000  TempSrc:   PainSc: 0-No pain                 Audry Pili

## 2019-07-05 NOTE — Transfer of Care (Signed)
Immediate Anesthesia Transfer of Care Note  Patient: Frances Medina  Procedure(s) Performed: COLONOSCOPY WITH PROPOFOL (N/A ) ESOPHAGOGASTRODUODENOSCOPY (EGD) WITH PROPOFOL (N/A ) POLYPECTOMY BIOPSY  Patient Location: PACU and Endoscopy Unit  Anesthesia Type:General  Level of Consciousness: awake, alert  and patient cooperative  Airway & Oxygen Therapy: Patient Spontanous Breathing and Patient connected to face mask oxygen  Post-op Assessment: Report given to RN and Post -op Vital signs reviewed and stable  Post vital signs: Reviewed and stable  Last Vitals:  Vitals Value Taken Time  BP    Temp    Pulse 97 07/05/19 0934  Resp 13 07/05/19 0934  SpO2 100 % 07/05/19 0934  Vitals shown include unvalidated device data.  Last Pain:  Vitals:   07/05/19 0725  TempSrc: Oral  PainSc: 0-No pain         Complications: No apparent anesthesia complications

## 2019-07-05 NOTE — Interval H&P Note (Signed)
History and Physical Interval Note:  07/05/2019 8:32 AM  Frances Medina  has presented today for surgery, with the diagnosis of History of Polyps, GERD.  The various methods of treatment have been discussed with the patient and family. After consideration of risks, benefits and other options for treatment, the patient has consented to  Procedure(s): COLONOSCOPY WITH PROPOFOL (N/A) ESOPHAGOGASTRODUODENOSCOPY (EGD) WITH PROPOFOL (N/A) as a surgical intervention.  The patient's history has been reviewed, patient examined, no change in status, stable for surgery.  I have reviewed the patient's chart and labs.  Questions were answered to the patient's satisfaction.     Pricilla Riffle. Fuller Plan

## 2019-07-05 NOTE — Op Note (Addendum)
[facility] Patient Name: Frances Medina Procedure Date: 07/05/2019 MRN: [id number] Attending MD: Ladene Artist , MD Date of Birth: [birth year] CSN: [id number] Age: 59 Admit Type: Outpatient Procedure:                Upper GI endoscopy Indications:              Epigastric abdominal pain, Iron deficiency anemia,                            Early satiety Providers:                Pricilla Riffle. Fuller Plan, MD, Elmer Ramp. Tilden Dome, RN, Marguerita Merles, Technician, Jefm Miles CRNA Referring MD:             [name], MD Medicines:                General Anesthesia Complications:            No immediate complications. Estimated Blood Loss:     Estimated blood loss was minimal. Procedure:                Pre-Anesthesia Assessment:                           - Prior to the procedure, a History and Physical                            was performed, and patient medications and                            allergies were reviewed. The patient's tolerance of                            previous anesthesia was also reviewed. The risks                            and benefits of the procedure and the sedation                            options and risks were discussed with the patient.                            All questions were answered, and informed consent                            was obtained. Prior Anticoagulants: The patient has                            taken no previous anticoagulant or antiplatelet                            agents. ASA Grade Assessment: III - A patient with  severe systemic disease. After reviewing the risks                            and benefits, the patient was deemed in                            satisfactory condition to undergo the procedure.                           - Prior to the procedure, a History and Physical                            was performed, and patient medications and                             allergies were reviewed. The patient's tolerance of                            previous anesthesia was also reviewed. The risks                            and benefits of the procedure and the sedation                            options and risks were discussed with the patient.                            All questions were answered, and informed consent                            was obtained. Prior Anticoagulants: The patient has                            taken no previous anticoagulant or antiplatelet                            agents. ASA Grade Assessment: III - A patient with                            severe systemic disease. After reviewing the risks                            and benefits, the patient was deemed in                            satisfactory condition to undergo the procedure.                           After obtaining informed consent, the endoscope was                            passed under direct vision. Throughout the  procedure, the patient's blood pressure, pulse, and                            oxygen saturations were monitored continuously. The                            [id number]) Olympus gastroscope was                            introduced through the mouth, and advanced to the                            second part of duodenum. The upper GI endoscopy was                            accomplished without difficulty. The patient                            tolerated the procedure well. Scope In: Scope Out: Findings:      The examined esophagus was normal.      Localized mildly erythematous mucosa without bleeding was found on the       posterior wall of the gastric antrum. Biopsies were taken with a cold       forceps for histology.      Localized moderately erythematous mucosa with bleeding on contact was       found on the anterior wall of the gastric body. Biopsies were taken with       a cold forceps for  histology.      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      The exam of the stomach was otherwise normal.      The duodenal bulb and second portion of the duodenum were normal. Impression:               - Normal esophagus.                           - Erythematous mucosa in the posterior wall of the                            gastric antrum. Biopsied.                           - Erythematous mucosa in the anterior wall of the                            gastric body. Biopsied.                           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                           -  Normal duodenal bulb and second portion of the                            duodenum. Moderate Sedation:      Not Applicable - Patient had care per Anesthesia. Recommendation:           - Patient has a contact number available for                            emergencies. The signs and symptoms of potential                            delayed complications were discussed with the                            patient. Return to normal activities tomorrow.                            Written discharge instructions were provided to the                            patient.                           - Resume previous diet.                           - Continue present medications.                           - Await pathology results.                           - No aspirin, ibuprofen, naproxen, or other                            non-steroidal anti-inflammatory drugs. Procedure Code(s):        --- Professional ---                           [phone], Esophagogastroduodenoscopy, flexible,                            transoral; with biopsy, single or multiple Diagnosis Code(s):        --- Professional ---                           K31.89, Other diseases of stomach and duodenum                           K91.89, Other  postprocedural complications and                            disorders of digestive system                           R10.13, Epigastric pain  D50.9, Iron deficiency anemia, unspecified                           R68.81, Early satiety CPT copyright 2019 American Medical Association. All rights reserved. The codes documented in this report are preliminary and upon coder review may  be revised to meet current compliance requirements. Ladene Artist, MD 07/05/2019 9:47:42 AM This report has been signed electronically. Number of Addenda: 0

## 2019-07-06 ENCOUNTER — Encounter: Payer: Self-pay | Admitting: Gastroenterology

## 2019-07-06 ENCOUNTER — Telehealth: Payer: Self-pay | Admitting: Family Medicine

## 2019-07-06 ENCOUNTER — Other Ambulatory Visit: Payer: Self-pay

## 2019-07-06 LAB — SURGICAL PATHOLOGY

## 2019-07-06 MED ORDER — BACLOFEN 5 MG PO TABS: 5.0000 mg | ORAL_TABLET | Freq: Three times a day (TID) | ORAL | 0 refills | Status: AC | PRN

## 2019-07-06 NOTE — Telephone Encounter (Signed)
Patient's daughter messaged me about her shaking all over. She c/o generalized body trembling, chills and pain which started a few minutes ago and is not getting better.  She had similar episode a while back following an IV contrast.  Yesterday, she had colonoscopy done under general anesthesia. She followed through with the appropriate colonoscopy bowel prep instructions.  As discussed with her, this could be anesthesia withdrawal symptoms, although it's been more than 24 hours since her procedure.  I advised her that this should self resolve soon, otherwise, ED recommended if symptoms persists.  I will escribed Baclofen prn muscle tremors and pain. She agreed with the plan and verbalized understanding.

## 2019-07-11 ENCOUNTER — Other Ambulatory Visit: Payer: Self-pay | Admitting: Family Medicine

## 2019-07-11 ENCOUNTER — Telehealth: Payer: Self-pay | Admitting: *Deleted

## 2019-07-11 NOTE — Telephone Encounter (Signed)
Noted  

## 2019-07-11 NOTE — Telephone Encounter (Signed)
Pt calls to let PCP know that Dr. Fuller Plan took her off of meloxicam.  She wanted to let PCP know so she could take it off her file. Christen Bame, CMA

## 2019-07-14 ENCOUNTER — Telehealth: Payer: BC Managed Care – PPO

## 2019-07-19 ENCOUNTER — Other Ambulatory Visit: Payer: Self-pay

## 2019-07-19 ENCOUNTER — Telehealth: Payer: Self-pay | Admitting: *Deleted

## 2019-07-19 ENCOUNTER — Telehealth (INDEPENDENT_AMBULATORY_CARE_PROVIDER_SITE_OTHER): Payer: BC Managed Care – PPO | Admitting: Family Medicine

## 2019-07-19 ENCOUNTER — Encounter: Payer: Self-pay | Admitting: Family Medicine

## 2019-07-19 DIAGNOSIS — R251 Tremor, unspecified: Secondary | ICD-10-CM | POA: Diagnosis not present

## 2019-07-19 DIAGNOSIS — R0602 Shortness of breath: Secondary | ICD-10-CM

## 2019-07-19 NOTE — Assessment & Plan Note (Signed)
Unclear what the situation is with bariatric surgeon. She will contact their office directly to schedule follow-up appointment. She will like to get connected with the dietician. I gave her Dr. Jenne Campus card to schedule and appointment and placed referral.

## 2019-07-19 NOTE — Progress Notes (Signed)
[place] Telemedicine Visit  Patient consented to have virtual visit and was identified by name and date of birth. Method of visit: Video  Encounter participants: Patient: Frances Medina - located at Home Provider: Andrena Mews - located at Office Others (if applicable): N/A  Chief Complaint: Tremors, stomach pain  HPI:  Tremors: She had a shaking episode a day after her colonoscopy. This lasted a few minutes and self resolved. She was prescribed Baclofen prn but had not needed to use it. She thinks the trigger is her nerves/anxiety. She is able to better control the symptoms by relaxing and taking her Clonazepam. Denies any new concerns.  SOB: Since she was taken off Mobic, her chest heaviness and SOB has resolved.   Stomach pain: Endorsed some stomach pain on and off since Colonoscopy. No GI bleed or other GI symptoms. She uses Tylenol as needed.  Weight: Yet to here back from surgery for her weight loss surgery.  ROS: per HPI  Pertinent PMHx: PMX reviewed  Exam:  There were no vitals taken for this visit.   Physical Exam Constitutional:      Appearance: Normal appearance. She is obese. She is not ill-appearing.  Pulmonary:     Effort: No respiratory distress.  Neurological:     Mental Status: She is oriented to person, place, and time.      Assessment/Plan:  Tremor Etiology unclear. Improved with Clonazepam and calmness. Monitor closely for now.  Shortness of breath Improved off Mobic. Continue to hold for now. Monitor closely.  Morbid obesity ([place]) Unclear what the situation is with bariatric surgeon. She will contact their office directly to schedule follow-up appointment. She will like to get connected with the dietician. I gave her Dr. Jenne Campus card to schedule and appointment and placed referral.   Stomach pain: Following colonoscopy. No other GI symptoms. Use tylenol as needed and monitor for now. She will f/u soon if it  persists or worsens. Time spent during visit with patient: 15 minutes

## 2019-07-19 NOTE — Assessment & Plan Note (Signed)
Improved off Mobic. Continue to hold for now. Monitor closely.

## 2019-07-19 NOTE — Patient Instructions (Signed)

## 2019-07-19 NOTE — Telephone Encounter (Signed)
LVM informing pt I was calling to do pre-visit questions. In message said I would try back in 10-15 minutes. Frances Medina, CMA

## 2019-07-19 NOTE — Assessment & Plan Note (Signed)
Etiology unclear. Improved with Clonazepam and calmness. Monitor closely for now.

## 2019-07-21 NOTE — Addendum Note (Signed)
Addended by: Andrena Mews T on: 07/21/2019 03:31 PM   Modules accepted: Level of Service

## 2019-07-26 ENCOUNTER — Ambulatory Visit: Payer: BC Managed Care – PPO

## 2019-07-26 ENCOUNTER — Other Ambulatory Visit: Payer: Self-pay

## 2019-07-30 ENCOUNTER — Other Ambulatory Visit: Payer: Self-pay | Admitting: Family Medicine

## 2019-08-07 NOTE — Patient Instructions (Addendum)
Visit Information  Goals Addressed            This Visit's Progress   . " I want to lose weight to help me breath better" (pt-stated)       Current Barriers:  [name] Knowledge Deficits related to weight loss strategies as evidenced by weight gain reported weight 320 lbs. . Chronic Disease Management support and education needs related to weight management in patient with  OSA/Restrictive Lung Diease Nurse Case Manager Clinical Goal(s):  [name] Over the next 30 days, patient will verbalize basic understanding of plan for initiation of weight loss strategies . Over the next 90 days, patient will demonstrate improved health management independence as evidenced by keeping daily food log/diary and increasing daily water intake (patient does not have provider prescribed fluid restriction) . Over the next 30 days, patient will work with RN Case Manager to address needs related to any barriers that she may have related to weight loss  Interventions:   Evaluation of current treatment plan related to weight loss and patient's adherence to plan as established by provider . Provided verbal and/or written education to patient re: recommended life style changes: avoid fad diets, make small/incremental dietary and light exercise changes( IE.., walk around her kitchen table, sit in a chair and exercise her Medina with  small water bottles for a few minutes a day.), eat at the table and avoid eating in front of the TV, plan management of cravings, monitor snacking and cravings in food diary . Discussed plans with patient for ongoing care management follow up and provided patient with direct contact information for care management team . Reduces sodium in her diet, discussed the effects of sodium on the body. . Monitor fried foods . Weigh monthly . Be realistic on weight to lose.  Patient states 1-2 lbs to start.  . 07/26/19 . Patient states that she has been drinking green smoothies x 1 week . She ate a salad today   and drank coffee . She is still using the floor exercise bike and going up and down the stairs there a 14 steps and she will go twice . She states that she  plans to call the nutritionist in the office . She has called about her weight loss surgery and they stated that they are short handed right now . We discussed her meds and she stopped taking her meloxicam.  She felt that is was causing her to have chest pain.    Patient Self Care Activities:  . Performs ADL's independently . Calls provider office for new concerns or questions . Unable to independently self manage weight loss  Please see past updates related to this goal by clicking on the "Past Updates" button in the selected goal         Frances Medina was given information about Care Management services today including:  1. Care Management services include personalized support from designated clinical staff supervised by her physician, including individualized plan of care and coordination with other care providers 2. 24/7 contact phone numbers for assistance for urgent and routine care needs. 3. The patient may stop CCM services at any time (effective at the end of the month) by phone call to the office staff.  Patient agreed to services and verbal consent obtained.   The patient verbalized understanding of instructions provided today and declined a print copy of patient instruction materials.    RNCM will follow up with the patient within the month of  June  Frances Arms RN, BSN, [facility] Care Management Coordinator [place] Phone: [phone] Fax: [phone]

## 2019-08-07 NOTE — Chronic Care Management (AMB) (Signed)
Care Management   Follow Up Note   08/07/2019 Name: Frances Medina MRN: [id number] DOB: 1960/05/01  Referred by: [name], MD Reason for referral : Care Coordination (Care Management  2 week F/U)   Frances Medina is a 59 y.o. year old female who is a primary care patient of [name], MD. The care management team was consulted for assistance with care management and care coordination needs.    Review of patient status, including review of consultants reports, relevant laboratory and other test results, and collaboration with appropriate care team members and the patient's provider was performed as part of comprehensive patient evaluation and provision of chronic care management services.    SDOH (Social Determinants of Health) assessments performed: No See Care Plan activities for detailed interventions related to [facility])     Advanced Directives: See Care Plan and Vynca application for related entries.   Goals Addressed            This Visit's Progress   . " I want to lose weight to help me breath better" (pt-stated)       Current Barriers:  [name] Knowledge Deficits related to weight loss strategies as evidenced by weight gain reported weight 320 lbs. . Chronic Disease Management support and education needs related to weight management in patient with  OSA/Restrictive Lung Diease Nurse Case Manager Clinical Goal(s):  [name] Over the next 30 days, patient will verbalize basic understanding of plan for initiation of weight loss strategies . Over the next 90 days, patient will demonstrate improved health management independence as evidenced by keeping daily food log/diary and increasing daily water intake (patient does not have provider prescribed fluid restriction) . Over the next 30 days, patient will work with RN Case Manager to address needs related to any barriers that she may have related to weight loss  Interventions:   Evaluation of current treatment plan related to  weight loss and patient's adherence to plan as established by provider . Provided verbal and/or written education to patient re: recommended life style changes: avoid fad diets, make small/incremental dietary and light exercise changes( IE.., walk around her kitchen table, sit in a chair and exercise her arms with  small water bottles for a few minutes a day.), eat at the table and avoid eating in front of the TV, plan management of cravings, monitor snacking and cravings in food diary . Discussed plans with patient for ongoing care management follow up and provided patient with direct contact information for care management team . Reduces sodium in her diet, discussed the effects of sodium on the body. . Monitor fried foods . Weigh monthly . Be realistic on weight to lose.  Patient states 1-2 lbs to start.  . 07/26/19 . Patient states that she has been drinking green smoothies x 1 week . She ate a salad today  and drank coffee . She is still using the floor exercise bike and going up and down the stairs there a 14 steps and she will go twice . She states that she  plans to call the nutritionist in the office . She has called about her weight loss surgery and they stated that they are short handed right now . We discussed her meds and she stopped taking her meloxicam.  She felt that is was causing her to have chest pain.    Patient Self Care Activities:  . Performs ADL's independently . Calls provider office for new concerns or questions . Unable  to independently self manage weight loss  Please see past updates related to this goal by clicking on the "Past Updates" button in the selected goal          RNCM will follow up with the patient within the next in the month of June  Ademola Vert RN, BSN, [place] Phone: [phone] Fax: [phone]

## 2019-08-09 ENCOUNTER — Ambulatory Visit (INDEPENDENT_AMBULATORY_CARE_PROVIDER_SITE_OTHER): Payer: BC Managed Care – PPO | Admitting: Family Medicine

## 2019-08-09 ENCOUNTER — Other Ambulatory Visit: Payer: Self-pay

## 2019-08-09 DIAGNOSIS — R7303 Prediabetes: Secondary | ICD-10-CM

## 2019-08-09 NOTE — Patient Instructions (Addendum)
-   We tend to get accustomed to whatever eating schedule we impose on ourselves.  Then we like what we get used to.  This means you can choose the type of eating schedule you want to follow, and become adapted to it.   - This matters because spreading your food out over the whole day, avoiding long periods of time of not eating during the day, is best for managing your appetite, which in turn helps to manage blood sugar levels and weight.  This is the eating pattern you want to practice.  Ideally, you will eat breakfast within the first hour after getting up, followed by meals/snacks no further apart than 4-5 hours.    1. Physical activity goals:      Use pedal exerciser for at least 5 minutes every Mon, Wed, and Fri.           Use [employer] for at least 15 minutes on Tue, Thur, and Saturday.      Walk in place and alternate with that side-to-side steps at least 3 times a day.       Document each time you exercise, recording what you do & how many minutes.    2. Eat whole, real food for at least 2 of your three meals per day.  (Smoothies can be your third meal.  Limit banana to 1/2 per smoothie.)      Any meal should include a source of protein, starch, and vegetables and/or fruit.  Aim for using fruit at breakfast and vegetables for lunch and dinner.    3. Each day, think about the next day:     Where will I be at lunch / dinner time tomorrow?    When will it be possible for me to break for lunch?    What will I eat for my meals tomorrow?    WRITE DOWN YOUR PLANS FOR TOMORROW.      - Use the Meal Planning form provided today to come up with some default meals.  Use these meal plans as a basis for shopping, so you can make them at any time.    Follow-up: Telehealth visit via [place] on Tuesday, July 6 at 9:30 AM.

## 2019-08-09 NOTE — Progress Notes (Signed)
Telehealth Encounter (Email link to [email or web address] ) PCP Andrena Mews, MD I connected with ELLORIE KINDALL (MRN [id number]) on 08/09/2019 by MyChart video-enabled, HIPAA-compliant telemedicine application, verified that I was speaking with the correct person using two identifiers, and that the patient was in a private environment conducive to confidentiality.  The patient agreed to proceed.  Provider was Kennith Center, PhD, RD, LDN, CEDRD Provider was located at [facility] during this telehealth encounter; patient was at home  Appt start time: 1000 end time: 1100 (1 hour)  Reason for telehealth visit: Referred by Andrena Mews, MD for Medical Nutrition Therapy related to obesity and pre-diabetes.    Relevant history/background: Ms. Retz was seen for MNT in 2018, and was working on improving nutritional balance of meals (adequate protein, fruits, and veg's) and increasing physical activity.  Knee, hip, and back pain, as well as lung disease make PA more challenging.  She also has a dx of myesthenia gravis for which she has previously had a G-tube placed for swallowing difficulties (now removed), and which has necessitated prednisone intermittently.    Assessment:  Ms. Goebel is considering bariatric surgery.  Meeting on 07/26/19 with [name], RN for chronic care mgmt, Ms. Reust agreed to start exercise consisting of light exercise (walking around kitchen table, chair exercises with water bottles) and keeping a food record.  Plan also included eating at the table without TV, reducing dietary Na, monitoring fried foods, and weighing monthly.  Wt loss goal is 1-2 lb/week.  Usual eating pattern: 1 meal and 2-3 snacks per day.  Often using smoothie for bkfst and lunch.   Frequent foods and beverages: smoothies 2 X day (4 oz alm milk, handful grapes or berries, 1/2 Granny Smith apple, 1 tbsp peanut butter, handful spinach, 1 banana), 2 c coffee/day, each with 2 tbsp  creamer & 2 tbsp sugar, water, 12 oz Coke ~2 X wk; chx, hmbgr, veg's, potatoes, rice.   Avoided foods: fried foods, shellfish (allergy), milk (disliked), most yogurt.   Usual physical activity: walking 14-step stairs at home 2 X day.  Has a pedal exerciser, but has not used recently.   Sleep: Estimates average of 7-8 hours of sleep/night.  Progress on 07/26/19 behavioral goals: Has sporadically keeping a food record, has not managed exercise recommended.   24-hr recall:  (Up at 7:30 AM) B (8 AM)-  12 oz beef broth Snk ( AM)-  --- L (2 PM)-  1 c pinto beans, water Snk ( PM)-  --- D (6 PM)-  1 chsbgr w/ tom&onion, 1 hotdog (no bun), 2 oz chips, 12 oz Coke Snk (10 PM)-  12 oz Coke Typical day? No.  Has usually been having smoothies for bkfast and lunch.     Intervention: Completed diet and exercise history, and established behavioral goals.   For recommendations and goals, see Patient Instructions.    Follow-up: Telehealth appt in 4 weeks.    Folasade Mooty,JEANNIE

## 2019-08-16 ENCOUNTER — Telehealth (INDEPENDENT_AMBULATORY_CARE_PROVIDER_SITE_OTHER): Payer: BC Managed Care – PPO | Admitting: Family Medicine

## 2019-08-16 ENCOUNTER — Encounter: Payer: Self-pay | Admitting: Family Medicine

## 2019-08-16 ENCOUNTER — Other Ambulatory Visit: Payer: Self-pay

## 2019-08-16 DIAGNOSIS — R109 Unspecified abdominal pain: Secondary | ICD-10-CM

## 2019-08-16 NOTE — Patient Instructions (Signed)

## 2019-08-16 NOTE — Progress Notes (Signed)
[place] Telemedicine Visit  Patient consented to have virtual visit and was identified by name and date of birth. Method of visit: Video  Encounter participants: Patient: Frances Medina - located at Home Provider: Andrena Mews - located at [facility] Office Others (if applicable): N/A  Chief Complaint: Abdominal pain  HPI:  Abdominal Pain This is a new problem. The current episode started in the past 7 days (Started 2 days ago). The onset quality is gradual. The problem occurs intermittently. The problem has been waxing and waning. The pain is located in the LLQ. The pain is at a severity of 0/10 (10/10 in severity whenever it happens. Last episode was yesterday). The quality of the pain is sharp. The abdominal pain does not radiate. Associated symptoms include constipation. Pertinent negatives include no anorexia, diarrhea, dysuria, fever, melena, nausea or vomiting. Associated symptoms comments: Last bowel movement was yesterday. Moves bowel daily. Stool hard. Relieved by: Rest. She has tried acetaminophen for the symptoms. The treatment provided moderate relief.    ROS: per HPI  Pertinent PMHx: PMX reviewed  Exam:  There were no vitals taken for this visit.  Physical Exam Constitutional:      Appearance: Normal appearance.     Comments: Video visit  Pulmonary:     Effort: Pulmonary effort is normal. No respiratory distress.  Abdominal:     Comments: Unable to perform  Neurological:     Mental Status: She is oriented to person, place, and time.      Assessment/Plan:  Abdominal pain: Sounds more like MSK pain given that it is triggered by certain position. I reviewed and discussed her colonoscopy and endoscopy report which are negative for malignancy. I discussed keeping pain diary - document trigger, duration, relieving factors, e.t.c. ED visit if pain worsens. She will see me in about 1-2 weeks so I can examine her. Consider Abdominal US vs CT  if pain persists or worsens. She agreed with the plan.  Time spent during visit with patient: 20 minutes

## 2019-08-19 ENCOUNTER — Other Ambulatory Visit: Payer: Self-pay

## 2019-08-19 ENCOUNTER — Ambulatory Visit: Payer: BC Managed Care – PPO

## 2019-08-19 NOTE — Chronic Care Management (AMB) (Signed)
  Care Management   Outreach Note  08/19/2019 Name: Frances Medina MRN: [id number] DOB: 12-27-1960  Referred by: [name], MD Reason for referral : Care Coordination (Care Management RNCM Obesity )   An unsuccessful telephone outreach was attempted today. The patient was referred to the case management team for assistance with care management and care coordination.   Follow Up Plan: A HIPPA compliant phone message was left for the patient providing contact information and requesting a return call.  The care management team will reach out to the patient again over the next 5-7 days.   [name] RN, BSN, [facility] Care Management Coordinator [place] Phone: [phone] Fax: [phone]

## 2019-08-22 ENCOUNTER — Ambulatory Visit: Payer: Self-pay

## 2019-08-23 NOTE — Patient Instructions (Signed)
Visit Information  Goals Addressed              This Visit's Progress   .  " I want to lose weight to help me breath better" (pt-stated)        Current Barriers:  [name] Knowledge Deficits related to weight loss strategies as evidenced by weight gain reported weight 320 lbs. . Chronic Disease Management support and education needs related to weight management in patient with  OSA/Restrictive Lung Diease Nurse Case Manager Clinical Goal(s):  [name] Over the next 30 days, patient will verbalize basic understanding of plan for initiation of weight loss strategies . Over the next 90 days, patient will demonstrate improved health management independence as evidenced by keeping daily food log/diary and increasing daily water intake (patient does not have provider prescribed fluid restriction) . Over the next 30 days, patient will work with RN Case Manager to address needs related to any barriers that she may have related to weight loss  Interventions:   Evaluation of current treatment plan related to weight loss and patient's adherence to plan as established by provider . Provided verbal and/or written education to patient re: recommended life style changes: avoid fad diets, make small/incremental dietary and light exercise changes( IE.., walk around her kitchen table, sit in a chair and exercise her arms with  small water bottles for a few minutes a day.), eat at the table and avoid eating in front of the TV, plan management of cravings, monitor snacking and cravings in food diary . Discussed plans with patient for ongoing care management follow up and provided patient with direct contact information for care management team . Reduces sodium in her diet, discussed the effects of sodium on the body. . Monitor fried foods . Weigh monthly . Be realistic on weight to lose.  Patient states 1-2 lbs to start.  . 08/22/19 . Patient states that she continues drinking green smoothies  . She is still using the  floor exercise bike and going up and down the stairs M-W-F . Uses her Body [employer] on [address]. She also keeps a log.  She states that she has spoken with the nutritionist. . She states that she is taking all of her meds . She called about her weight lost surgery that she has waited on for about a year and six months.  They stated that they have been short staffed        Patient Self Care Activities:  . Performs ADL's independently . Calls provider office for new concerns or questions . Unable to independently self manage weight loss  Please see past updates related to this goal by clicking on the "Past Updates" button in the selected goal         Ms. Hartel was given information about Care Management services today including:  1. Care Management services include personalized support from designated clinical staff supervised by her physician, including individualized plan of care and coordination with other care providers 2. 24/7 contact phone numbers for assistance for urgent and routine care needs. 3. The patient may stop CCM services at any time (effective at the end of the month) by phone call to the office staff.  Patient agreed to services and verbal consent obtained.   The patient verbalized understanding of instructions provided today and declined a print copy of patient instruction materials.   The care management team will reach out to the patient again over the next 14 days.  The patient has been provided  with contact information for the care management team and has been advised to call with any health related questions or concerns.   [name] RN, BSN, [facility] Care Management Coordinator [place] Phone: [phone] Fax: [phone]

## 2019-08-23 NOTE — Chronic Care Management (AMB) (Signed)
Care Management   Follow Up Note   08/23/2019 Name: Frances Medina MRN: [id number] DOB: 1960/10/28  Referred by: [name], MD Reason for referral : Care Coordination (Care Management RNCM Obesity )   [name] is a 59 y.o. year old female who is a primary care patient of [name], MD. The care management team was consulted for assistance with care management and care coordination needs.    Review of patient status, including review of consultants reports, relevant laboratory and other test results, and collaboration with appropriate care team members and the patient's provider was performed as part of comprehensive patient evaluation and provision of chronic care management services.    SDOH (Social Determinants of Health) assessments performed: No See Care Plan activities for detailed interventions related to [facility])     Advanced Directives: See Care Plan and Vynca application for related entries.   Goals Addressed              This Visit's Progress   .  " I want to lose weight to help me breath better" (pt-stated)        Current Barriers:  [name] Knowledge Deficits related to weight loss strategies as evidenced by weight gain reported weight 320 lbs. . Chronic Disease Management support and education needs related to weight management in patient with  OSA/Restrictive Lung Diease Nurse Case Manager Clinical Goal(s):  [name] Over the next 30 days, patient will verbalize basic understanding of plan for initiation of weight loss strategies . Over the next 90 days, patient will demonstrate improved health management independence as evidenced by keeping daily food log/diary and increasing daily water intake (patient does not have provider prescribed fluid restriction) . Over the next 30 days, patient will work with RN Case Manager to address needs related to any barriers that she may have related to weight loss  Interventions:   Evaluation of current treatment plan  related to weight loss and patient's adherence to plan as established by provider . Provided verbal and/or written education to patient re: recommended life style changes: avoid fad diets, make small/incremental dietary and light exercise changes( IE.., walk around her kitchen table, sit in a chair and exercise her arms with  small water bottles for a few minutes a day.), eat at the table and avoid eating in front of the TV, plan management of cravings, monitor snacking and cravings in food diary . Discussed plans with patient for ongoing care management follow up and provided patient with direct contact information for care management team . Reduces sodium in her diet, discussed the effects of sodium on the body. . Monitor fried foods . Weigh monthly . Be realistic on weight to lose.  Patient states 1-2 lbs to start.  . 08/22/19 . Patient states that she continues drinking green smoothies  . She is still using the floor exercise bike and going up and down the stairs M-W-F . Uses her [employer] on [address]. She also keeps a log.  She states that she has spoken with the nutritionist. . She states that she is taking all of her meds . She called about her weight lost surgery that she has waited on for about a year and six months.  They stated that they have been short staffed        Patient Self Care Activities:  . Performs ADL's independently . Calls provider office for new concerns or questions . Unable to independently self manage weight loss  Please  see past updates related to this goal by clicking on the "Past Updates" button in the selected goal          The care management team will reach out to the patient again over the next 14 days.  The patient has been provided with contact information for the care management team and has been advised to call with any health related questions or concerns.   [name] RN, BSN, [facility] Care Management Coordinator [place] Phone: [phone] Fax: [phone]

## 2019-08-25 ENCOUNTER — Encounter: Payer: Self-pay | Admitting: Family Medicine

## 2019-08-26 ENCOUNTER — Other Ambulatory Visit: Payer: Self-pay

## 2019-08-26 ENCOUNTER — Encounter: Payer: Self-pay | Admitting: Family Medicine

## 2019-08-26 ENCOUNTER — Ambulatory Visit (INDEPENDENT_AMBULATORY_CARE_PROVIDER_SITE_OTHER): Payer: BC Managed Care – PPO | Admitting: Family Medicine

## 2019-08-26 VITALS — BP 132/80 | HR 74 | Ht 60.0 in | Wt 322.6 lb

## 2019-08-26 DIAGNOSIS — G7000 Myasthenia gravis without (acute) exacerbation: Secondary | ICD-10-CM

## 2019-08-26 DIAGNOSIS — R768 Other specified abnormal immunological findings in serum: Secondary | ICD-10-CM | POA: Insufficient documentation

## 2019-08-26 DIAGNOSIS — R109 Unspecified abdominal pain: Secondary | ICD-10-CM | POA: Diagnosis not present

## 2019-08-26 DIAGNOSIS — M25552 Pain in left hip: Secondary | ICD-10-CM

## 2019-08-26 DIAGNOSIS — Z9989 Dependence on other enabling machines and devices: Secondary | ICD-10-CM

## 2019-08-26 DIAGNOSIS — G43809 Other migraine, not intractable, without status migrainosus: Secondary | ICD-10-CM

## 2019-08-26 DIAGNOSIS — R7689 Other specified abnormal immunological findings in serum: Secondary | ICD-10-CM

## 2019-08-26 DIAGNOSIS — M25551 Pain in right hip: Secondary | ICD-10-CM | POA: Diagnosis not present

## 2019-08-26 DIAGNOSIS — G4733 Obstructive sleep apnea (adult) (pediatric): Secondary | ICD-10-CM

## 2019-08-26 NOTE — Patient Instructions (Signed)
It was nice seeing you today. I am sorry about your headache. Please use Magnesium Oxide 400 mg as needed for headache plus Tylenol. Let me know if there is no improvement. Then we will send you for a CT head. Also go to the radiology for xray of your hips.  Migraine Headache A migraine headache is a very strong throbbing pain on one side or both sides of your head. This type of headache can also cause other symptoms. It can last from 4 hours to 3 days. Talk with your doctor about what things may bring on (trigger) this condition. What are the causes? The exact cause of this condition is not known. This condition may be triggered or caused by:  Drinking alcohol.  Smoking.  Taking medicines, such as: ? Medicine used to treat chest pain (nitroglycerin). ? Birth control pills. ? Estrogen. ? Some blood pressure medicines.  Eating or drinking certain products.  Doing physical activity. Other things that may trigger a migraine headache include:  Having a menstrual period.  Pregnancy.  Hunger.  Stress.  Not getting enough sleep or getting too much sleep.  Weather changes.  Tiredness (fatigue). What increases the risk?  Being 81-30 years old.  Being female.  Having a family history of migraine headaches.  Being Caucasian.  Having depression or anxiety.  Being very overweight. What are the signs or symptoms?  A throbbing pain. This pain may: ? Happen in any area of the head, such as on one side or both sides. ? Make it hard to do daily activities. ? Get worse with physical activity. ? Get worse around bright lights or loud noises.  Other symptoms may include: ? Feeling sick to your stomach (nauseous). ? Vomiting. ? Dizziness. ? Being sensitive to bright lights, loud noises, or smells.  Before you get a migraine headache, you may get warning signs (an aura). An aura may include: ? Seeing flashing lights or having blind spots. ? Seeing bright spots, halos, or  zigzag lines. ? Having tunnel vision or blurred vision. ? Having numbness or a tingling feeling. ? Having trouble talking. ? Having weak muscles.  Some people have symptoms after a migraine headache (postdromal phase), such as: ? Tiredness. ? Trouble thinking (concentrating). How is this treated?  Taking medicines that: ? Relieve pain. ? Relieve the feeling of being sick to your stomach. ? Prevent migraine headaches.  Treatment may also include: ? Having acupuncture. ? Avoiding foods that bring on migraine headaches. ? Learning ways to control your body functions (biofeedback). ? Therapy to help you know and deal with negative thoughts (cognitive behavioral therapy). Follow these instructions at home: Medicines  Take over-the-counter and prescription medicines only as told by your doctor.  Ask your doctor if the medicine prescribed to you: ? Requires you to avoid driving or using heavy machinery. ? Can cause trouble pooping (constipation). You may need to take these steps to prevent or treat trouble pooping:  Drink enough fluid to keep your pee (urine) pale yellow.  Take over-the-counter or prescription medicines.  Eat foods that are high in fiber. These include beans, whole grains, and fresh fruits and vegetables.  Limit foods that are high in fat and sugar. These include fried or sweet foods. Lifestyle  Do not drink alcohol.  Do not use any products that contain nicotine or tobacco, such as cigarettes, e-cigarettes, and chewing tobacco. If you need help quitting, ask your doctor.  Get at least 8 hours of sleep every night.  Limit and deal with stress. General instructions      Keep a journal to find out what may bring on your migraine headaches. For example, write down: ? What you eat and drink. ? How much sleep you get. ? Any change in what you eat or drink. ? Any change in your medicines.  If you have a migraine headache: ? Avoid things that make your  symptoms worse, such as bright lights. ? It may help to lie down in a dark, quiet room. ? Do not drive or use heavy machinery. ? Ask your doctor what activities are safe for you.  Keep all follow-up visits as told by your doctor. This is important. Contact a doctor if:  You get a migraine headache that is different or worse than others you have had.  You have more than 15 headache days in one month. Get help right away if:  Your migraine headache gets very bad.  Your migraine headache lasts longer than 72 hours.  You have a fever.  You have a stiff neck.  You have trouble seeing.  Your muscles feel weak or like you cannot control them.  You start to lose your balance a lot.  You start to have trouble walking.  You pass out (faint).  You have a seizure. Summary  A migraine headache is a very strong throbbing pain on one side or both sides of your head. These headaches can also cause other symptoms.  This condition may be treated with medicines and changes to your lifestyle.  Keep a journal to find out what may bring on your migraine headaches.  Contact a doctor if you get a migraine headache that is different or worse than others you have had.  Contact your doctor if you have more than 15 headache days in a month. This information is not intended to replace advice given to you by your health care provider. Make sure you discuss any questions you have with your health care provider. Document Revised: 06/11/2018 Document Reviewed: 04/01/2018 Elsevier Patient Education  [place].

## 2019-08-26 NOTE — Assessment & Plan Note (Addendum)
Hx of OA hip in the past. Xray ordered for further eval. She declined PT referral. Tylenol as needed for pain. Continue home exercise.

## 2019-08-26 NOTE — Assessment & Plan Note (Signed)
She is stable on Cellcept and compliant with neurology f/u.

## 2019-08-26 NOTE — Progress Notes (Signed)
SUBJECTIVE:   CHIEF COMPLAINT / HPI:  Abdominal Pain This is a new (Started a few weeks ago. However, she has not had any more episodes since her last visit 2 weeks ago) problem. The problem has been resolved. Pain location: Lower abdominal pain. Associated symptoms include headaches. Pertinent negatives include no diarrhea or vomiting. Associated symptoms comments: Some constipation, improves with stool softner. Felt nauseous yesterday. Nothing aggravates the pain. She has tried nothing for the symptoms.  Hip Pain  Incident onset: Groin pain radiating to the back of her hip started about a week ago. Incident location: denies injury to her joints. She started home gym a few days after her pain started. There was no injury mechanism. The pain is at a severity of 8/10 (On both hips). The pain has been fluctuating since onset. Pertinent negatives include no inability to bear weight or muscle weakness. The symptoms are aggravated by movement. Treatments tried: Baclofen helps a lot. She uses Tylenol with minimal improvement. The treatment provided moderate relief.  Headache  This is a chronic problem. The problem occurs intermittently. The problem has been waxing and waning. The pain is located in the frontal (Last episode was last night on the left frontal head and then it went to the back of her head. She will have headache almost every other day) region. The quality of the pain is described as throbbing and sharp. The pain is at a severity of 0/10 (She does not have a headache now. However, whenever she does it's about 10/10 in severity). Associated symptoms include abdominal pain. Pertinent negatives include no vomiting. Nothing aggravates the symptoms. Treatments tried: Rest. No meds. The treatment provided moderate relief. Her past medical history is significant for migraine headaches.  OSA: Compliant with her sleep machine. Her machine was last checked many years ago.  MG: Compliant with her  Cellcept. Recent neuro f/u and her next appointment is in Dec. ANA +: No symptoms related to lupus.   PERTINENT  PMH / PSH: PMX reviewed.  OBJECTIVE:   BP 132/80   Pulse 74   Ht 5' (1.524 m)   Wt (!) 322 lb 9.6 oz (146.3 kg)   SpO2 100%   BMI 63.00 kg/m   Physical Exam Vitals and nursing note reviewed.  Constitutional:      Appearance: She is obese.  Cardiovascular:     Rate and Rhythm: Normal rate and regular rhythm.     Heart sounds: Normal heart sounds. No murmur heard.   Pulmonary:     Effort: Pulmonary effort is normal.     Breath sounds: Normal breath sounds. No wheezing or rhonchi.  Abdominal:     General: Bowel sounds are normal.     Palpations: Abdomen is soft.     Tenderness: There is abdominal tenderness.     Comments: Obese, mild RUQ and LUQ tenderness. Mild LLQ tenderness with deep palpation  Musculoskeletal:     Right hip: No tenderness. Normal range of motion.     Left hip: No tenderness. Normal range of motion.     Comments: Mild tenderness with palpation over her hip anteriorly and bilaterally  Neurological:     General: No focal deficit present.     Mental Status: She is oriented to person, place, and time.     Cranial Nerves: Cranial nerves are intact.     Gait: Gait is intact.      ASSESSMENT/PLAN:   Abdominal pain Mostly superficial MSK tenderness. ? + RUQ MSK  related. However, CMEt checked to assess her Liver status. Most recent hepatitis panel was normal. I will contact her soon with test results. Otherwise, call or go to the ED if symptoms worsens. She agreed with the plan.  Hip pain, bilateral Hx of OA hip in the past. Xray ordered for further eval. She declined PT referral. Tylenol as needed for pain. Continue home exercise.  Headache, migraine No neurologic deficit on exam. ?? Related to sleep apnea. Trial of Magnesium oxide and tylenol as needed. Imaging discussed. She will like to defer for now. F/U soon if no  improvement.  OSA on CPAP Compliant with sleep machine. Need to reassess her CPAP settings. Referral placed to sleep medicine.  Myasthenia gravis ([place]) She is stable on Cellcept and compliant with neurology f/u.  Positive ANA (antinuclear antibody) This was positive on lab ordered in the hospital from her 2017 hospitalization. She is unaware of this report. Test repeated today. I will contact her with result.     Andrena Mews, MD [place]

## 2019-08-26 NOTE — Assessment & Plan Note (Signed)
Compliant with sleep machine. Need to reassess her CPAP settings. Referral placed to sleep medicine.

## 2019-08-26 NOTE — Assessment & Plan Note (Signed)
Mostly superficial MSK tenderness. ? + RUQ MSK related. However, CMEt checked to assess her Liver status. Most recent hepatitis panel was normal. I will contact her soon with test results. Otherwise, call or go to the ED if symptoms worsens. She agreed with the plan.

## 2019-08-26 NOTE — Assessment & Plan Note (Signed)
This was positive on lab ordered in the hospital from her 2017 hospitalization. She is unaware of this report. Test repeated today. I will contact her with result.

## 2019-08-26 NOTE — Assessment & Plan Note (Signed)
No neurologic deficit on exam. ?? Related to sleep apnea. Trial of Magnesium oxide and tylenol as needed. Imaging discussed. She will like to defer for now. F/U soon if no improvement.

## 2019-08-28 LAB — CMP14+EGFR
ALT: 10 IU/L (ref 0–32)
AST: 14 IU/L (ref 0–40)
Albumin/Globulin Ratio: 1.7 (ref 1.2–2.2)
Albumin: 4.3 g/dL (ref 3.8–4.9)
Alkaline Phosphatase: 129 IU/L — ABNORMAL HIGH (ref 48–121)
BUN/Creatinine Ratio: 15 (ref 9–23)
BUN: 11 mg/dL (ref 6–24)
Bilirubin Total: 0.2 mg/dL (ref 0.0–1.2)
CO2: 24 mmol/L (ref 20–29)
Calcium: 9.2 mg/dL (ref 8.7–10.2)
Chloride: 104 mmol/L (ref 96–106)
Creatinine, Ser: 0.71 mg/dL (ref 0.57–1.00)
GFR calc Af Amer: 109 mL/min/{1.73_m2} (ref >59)
GFR calc non Af Amer: 94 mL/min/{1.73_m2} (ref >59)
Globulin, Total: 2.5 g/dL (ref 1.5–4.5)
Glucose: 86 mg/dL (ref 65–99)
Potassium: 4.3 mmol/L (ref 3.5–5.2)
Sodium: 141 mmol/L (ref 134–144)
Total Protein: 6.8 g/dL (ref 6.0–8.5)

## 2019-08-28 LAB — ANA W/RFX TO ALL IF POSITIVE: Anti Nuclear Antibody (ANA): NEGATIVE

## 2019-08-29 ENCOUNTER — Telehealth: Payer: Self-pay | Admitting: Family Medicine

## 2019-08-29 ENCOUNTER — Other Ambulatory Visit: Payer: Self-pay | Admitting: Family Medicine

## 2019-08-29 DIAGNOSIS — R748 Abnormal levels of other serum enzymes: Secondary | ICD-10-CM

## 2019-08-29 DIAGNOSIS — G4733 Obstructive sleep apnea (adult) (pediatric): Secondary | ICD-10-CM

## 2019-08-29 DIAGNOSIS — R109 Unspecified abdominal pain: Secondary | ICD-10-CM

## 2019-08-29 NOTE — Telephone Encounter (Signed)
Appt made for 09-07-19 at 9am.  Lab appt right after ultrasound.  Maiah Sinning,CMA

## 2019-08-29 NOTE — Progress Notes (Signed)
I discussed test result with her.   Mildly elevated Alk Phos. May be medication related, I.e Clonazepam.  Given mild RUQ pain and chronic abdominal pain, we will go ahead and obtain US. She prefers imaging than waiting and watching. We will obtain hepatitis panel when next she comes in. She will schedule lab appointment this week she says.

## 2019-08-29 NOTE — Telephone Encounter (Signed)
HIPAA compliant callback message left. Please attempt to connect me with the patient whenever she calls back to discuss her results. Thanks.

## 2019-08-29 NOTE — Telephone Encounter (Signed)
I discussed test result with her.   Mildly elevated Alk Phos. May be medication related, I.e Clonazepam.  Given mild RUQ pain and chronic abdominal pain, we will go ahead and obtain US. She prefers imaging than waiting and watching. We will obtain hepatitis panel when next she comes in. She will schedule lab appointment this week she says.   

## 2019-09-06 ENCOUNTER — Ambulatory Visit (INDEPENDENT_AMBULATORY_CARE_PROVIDER_SITE_OTHER): Payer: BC Managed Care – PPO | Admitting: Family Medicine

## 2019-09-06 ENCOUNTER — Other Ambulatory Visit: Payer: Self-pay

## 2019-09-06 DIAGNOSIS — R7303 Prediabetes: Secondary | ICD-10-CM | POA: Diagnosis not present

## 2019-09-06 DIAGNOSIS — R6881 Early satiety: Secondary | ICD-10-CM

## 2019-09-06 NOTE — Patient Instructions (Addendum)
One alternative to soda you may like is to dilute fruit juice with club soda or sparkling water.   Goals: 1. Exercise a total of at least 30 minutes per day.  Aim for moving especially lunch and dinner.   2. On non-pedaling days, get at least 3500 steps.   3. Obtain a meaningful protein source (at least 20 grams) a minimum 3 times per day.  Protein foods you can add to your salads:  Eggs, tuna, any meat or poultry, beans, plain [place] yogurt.   If you use a dairy alternative, use soy milk.  All other dairy milk alternatives are missing protein.   You might want to try a protein drink with coffee for breakfast, which will provide at least some protein and nutrition, and will be better for you than creamer and sugar.   Document the progress on your goals on the Goals Sheet provided today.    Follow-up: Call in September to schedule a follow-up appt:  [phone].

## 2019-09-06 NOTE — Progress Notes (Signed)
Telehealth Encounter (Email link to [email or web address] ) PCP Andrena Mews, MD I connected with Frances Medina (MRN [id number]) on 09/06/2019 by MyChart video-enabled, HIPAA-compliant telemedicine application, verified that I was speaking with the correct person using two identifiers, and that the patient was in a private environment conducive to confidentiality.  The patient agreed to proceed.  Provider was Kennith Center, PhD, RD, LDN, CEDRD Provider was located at [facility] during this telehealth encounter; patient was at home  Appt start time: 0930 end time: 1030 (1 hour)  Reason for telehealth visit: Referred by Andrena Mews, MD for Medical Nutrition Therapy related to obesity and pre-diabetes.    Relevant history/background: Frances Medina was seen for MNT in 2018, and was working on improving nutritional balance of meals (adequate protein, fruits, and veg's) and increasing physical activity.  Knee, hip, and back pain, as well as lung disease make PA more challenging.  She also has a dx of myesthenia gravis for which she has previously had a G-tube placed for swallowing difficulties (now removed), and which has necessitated prednisone intermittently.    Assessment:  Frances Medina has been keeping a daily food record.  She has been eating a lot of salads for lunch, and meeting the goal of using more whole, solid food in place of smoothies, which she is still having once a day.  She has not used the Meal Planning form, not planning further ahead than the next meal, which she feels works well for her.  Often getting protein only once a day unless she has a protein drink.  Also drinking 12 oz soda daily.   Recent usual eating pattern: 2 meal and 1-2 snacks per day.      Usual physical activity:  Using pedal exerciser 5-7 min for a total of 30 min/day 3 X wk.  Standing and walking in place and side to side 5-7 min for a total of 30 min/day 3 X wk.  Still walking 14-step stairs at  home 2-3 X day.   Exercise getting easier, but still significantly hurts knees and in her stomach.    24-hr recall:  (Up at 8 AM) B (8:15 AM)-  1 c coffee, 2 tsp creamer, 2 tsp sugar Snk ( AM)-  water L (4 PM)-  2 c mixed veg's, 1 banana, water Snk ( PM)-  water D (7:30 PM)-  1 large salad, 6 oz [place], 1 oz chs, 3 tbsp dressing, 12 oz soda, water Snk ( PM)-  water Typical day? Yes.   Drinking close to a gallon of water and 12 oz soda daily.  Intervention: Completed diet and exercise history, and re-established behavioral goals.  Answered questions about supplements, and discouraged use of "Blood Sugar focus," suggesting there is inadequate evidence to recommend its use.    For recommendations and goals, see Patient Instructions.    Follow-up: Telehealth appt prn, September 2021.    Frances Medina,Frances Medina

## 2019-09-07 ENCOUNTER — Other Ambulatory Visit: Payer: BC Managed Care – PPO

## 2019-09-07 ENCOUNTER — Other Ambulatory Visit: Payer: Self-pay

## 2019-09-07 ENCOUNTER — Ambulatory Visit (HOSPITAL_COMMUNITY)
Admission: RE | Admit: 2019-09-07 | Discharge: 2019-09-07 | Disposition: A | Discharge status: Home / Self Care | Payer: BC Managed Care – PPO | Source: Ambulatory Visit | Attending: Family Medicine | Admitting: Family Medicine

## 2019-09-07 DIAGNOSIS — R748 Abnormal levels of other serum enzymes: Secondary | ICD-10-CM | POA: Insufficient documentation

## 2019-09-07 DIAGNOSIS — R109 Unspecified abdominal pain: Secondary | ICD-10-CM | POA: Insufficient documentation

## 2019-09-08 ENCOUNTER — Telehealth: Payer: Self-pay | Admitting: Family Medicine

## 2019-09-08 LAB — HEPATITIS PANEL, ACUTE
Hep A IgM: NEGATIVE
Hep B C IgM: NEGATIVE
Hep C Virus Ab: 0.2 s/co ratio (ref 0.0–0.9)
Hepatitis B Surface Ag: NEGATIVE

## 2019-09-08 LAB — HEPATITIS B SURFACE ANTIBODY,QUALITATIVE: Hep B Surface Ab, Qual: NONREACTIVE

## 2019-09-08 NOTE — Telephone Encounter (Signed)
Hepatitis panel result discussed.  She is hepatitis B nonimmune. She will call back to schedule nurse visit for hepatitis B shots.  Liver US showed fatty liver. Weight loss discussed.

## 2019-09-09 ENCOUNTER — Ambulatory Visit: Payer: BC Managed Care – PPO

## 2019-09-09 ENCOUNTER — Other Ambulatory Visit: Payer: Self-pay

## 2019-09-09 NOTE — Patient Instructions (Signed)
Visit Information  Goals Addressed              This Visit's Progress   .  " I want to lose weight to help me breath better" (pt-stated)        Current Barriers:  [name] Knowledge Deficits related to weight loss strategies as evidenced by weight gain reported weight 320 lbs. . Chronic Disease Management support and education needs related to weight management in patient with  OSA/Restrictive Lung Diease Nurse Case Manager Clinical Goal(s):  [name] Over the next 30 days, patient will verbalize basic understanding of plan for initiation of weight loss strategies . Over the next 90 days, patient will demonstrate improved health management independence as evidenced by keeping daily food log/diary and increasing daily water intake (patient does not have provider prescribed fluid restriction) . Over the next 30 days, patient will work with RN Case Manager to address needs related to any barriers that she may have related to weight loss  Interventions:   Evaluation of current treatment plan related to weight loss and patient's adherence to plan as established by provider . Provided verbal and/or written education to patient re: recommended life style changes: avoid fad diets, make small/incremental dietary and light exercise changes( IE.., walk around her kitchen table, sit in a chair and exercise her arms with  small water bottles for a few minutes a day.), eat at the table and avoid eating in front of the TV, plan management of cravings, monitor snacking and cravings in food diary . Discussed plans with patient for ongoing care management follow up and provided patient with direct contact information for care management team . Reduces sodium in her diet, discussed the effects of sodium on the body. . Monitor fried foods . Weigh monthly . Be realistic on weight to lose.  Patient states 1-2 lbs to start.  . 09/09/19 . Patient states that she has lost five lbs  She feel that it has help her  breathing. . She is working with the nutritionist and still monitoring her diet;.  Changing her habits about her drinks and rtying to get more protein in. . She continue to exercise and walking 5000 steps . She has an appointment set up on Monday to  have her 1st Hep B shot      Patient Self Care Activities:  . Performs ADL's independently . Calls provider office for new concerns or questions . Unable to independently self manage weight loss  Please see past updates related to this goal by clicking on the "Past Updates" button in the selected goal         Ms. Vanhoose was given information about Care Management services today including:  1. Care Management services include personalized support from designated clinical staff supervised by her physician, including individualized plan of care and coordination with other care providers 2. 24/7 contact phone numbers for assistance for urgent and routine care needs. 3. The patient may stop CCM services at any time (effective at the end of the month) by phone call to the office staff.  Patient agreed to services and verbal consent obtained.   The patient verbalized understanding of instructions provided today and declined a print copy of patient instruction materials.   The care management team will reach out to the patient again over the next 14 days.   [name] RN, BSN, [facility] Care Management Coordinator [place] Phone: [phone] Fax: [phone]

## 2019-09-09 NOTE — Chronic Care Management (AMB) (Signed)
Care Management   Follow Up Note   09/09/2019 Name: Frances Medina MRN: [id number] DOB: 02-09-1961  Referred by: [name], MD Reason for referral : Chronic Care Management (Obesity / RLD)   Frances Medina is a 59 y.o. year old female who is a primary care patient of [name], MD. The care management team was consulted for assistance with care management and care coordination needs.    Review of patient status, including review of consultants reports, relevant laboratory and other test results, and collaboration with appropriate care team members and the patient's provider was performed as part of comprehensive patient evaluation and provision of chronic care management services.    SDOH (Social Determinants of Health) assessments performed: No See Care Plan activities for detailed interventions related to [facility])     Advanced Directives: See Care Plan and Vynca application for related entries.   Goals Addressed              This Visit's Progress   .  " I want to lose weight to help me breath better" (pt-stated)        Current Barriers:  [name] Knowledge Deficits related to weight loss strategies as evidenced by weight gain reported weight 320 lbs. . Chronic Disease Management support and education needs related to weight management in patient with  OSA/Restrictive Lung Diease Nurse Case Manager Clinical Goal(s):  [name] Over the next 30 days, patient will verbalize basic understanding of plan for initiation of weight loss strategies . Over the next 90 days, patient will demonstrate improved health management independence as evidenced by keeping daily food log/diary and increasing daily water intake (patient does not have provider prescribed fluid restriction) . Over the next 30 days, patient will work with RN Case Manager to address needs related to any barriers that she may have related to weight loss  Interventions:   Evaluation of current treatment plan related to  weight loss and patient's adherence to plan as established by provider . Provided verbal and/or written education to patient re: recommended life style changes: avoid fad diets, make small/incremental dietary and light exercise changes( IE.., walk around her kitchen table, sit in a chair and exercise her arms with  small water bottles for a few minutes a day.), eat at the table and avoid eating in front of the TV, plan management of cravings, monitor snacking and cravings in food diary . Discussed plans with patient for ongoing care management follow up and provided patient with direct contact information for care management team . Reduces sodium in her diet, discussed the effects of sodium on the body. . Monitor fried foods . Weigh monthly . Be realistic on weight to lose.  Patient states 1-2 lbs to start.  . 09/09/19 . Patient states that she has lost five lbs  She feel that it has help her breathing. . She is working with the nutritionist and still monitoring her diet;.  Changing her habits about her drinks and rtying to get more protein in. . She continue to exercise and walking 5000 steps . She has an appointment set up on Monday to  have her 1st Hep B shot      Patient Self Care Activities:  . Performs ADL's independently . Calls provider office for new concerns or questions . Unable to independently self manage weight loss  Please see past updates related to this goal by clicking on the "Past Updates" button in the selected goal  The care management team will reach out to the patient again over the next 14 days.   [name] RN, BSN, [facility] Care Management Coordinator [place] Phone: [phone] Fax: [phone]

## 2019-09-12 ENCOUNTER — Ambulatory Visit (INDEPENDENT_AMBULATORY_CARE_PROVIDER_SITE_OTHER): Payer: BC Managed Care – PPO

## 2019-09-12 ENCOUNTER — Telehealth: Payer: Self-pay | Admitting: Family Medicine

## 2019-09-12 ENCOUNTER — Other Ambulatory Visit: Payer: Self-pay

## 2019-09-12 DIAGNOSIS — Z23 Encounter for immunization: Secondary | ICD-10-CM

## 2019-09-12 NOTE — Telephone Encounter (Signed)
Patient is dropping off Disability parking placard to be filled out. Last date of service is 08/26/19.  I have placed form in blue team folder.

## 2019-09-12 NOTE — Telephone Encounter (Signed)
Clinical info completed on Disability placard form.  Place form in *PCP's box for completion.  Salvatore Marvel, CMA

## 2019-09-12 NOTE — Progress Notes (Signed)
Patient reports to nurse clinic for Hep B vaccination, per Dr. Gwendlyn Deutscher. Given in RD, site unremarkable.   Forwarding to Dr. Gwendlyn Deutscher. Is patient to receive full series (1 month and 6 month), or is titers to be redrawn after initial injection?  Please advise  Talbot Grumbling, RN

## 2019-09-13 NOTE — Telephone Encounter (Signed)
Form completed and left in RN's box

## 2019-09-14 NOTE — Telephone Encounter (Signed)
Given to patients daughter.

## 2019-09-18 ENCOUNTER — Other Ambulatory Visit: Payer: Self-pay | Admitting: Family Medicine

## 2019-09-21 ENCOUNTER — Other Ambulatory Visit: Payer: Self-pay | Admitting: Family Medicine

## 2019-09-21 MED ORDER — BACLOFEN 5 MG PO TABS
10.0000 mg | ORAL_TABLET | Freq: Three times a day (TID) | ORAL | 0 refills | Status: AC | PRN
Start: 2019-09-21 — End: 2019-10-05

## 2019-09-21 NOTE — Telephone Encounter (Signed)
Patient informed of message from MD and voiced understanding.  She will call back next week to make an appointment if she doesn't feel like the pain has improved.  Dimitri Shakespeare,CMA

## 2019-09-21 NOTE — Telephone Encounter (Signed)
Please advise patient.  Baclofen was prescribed for a short period for tremors and muscle spasm not for pain. She can trial Tylenol as needed for pain. Please, advise her that I will give an additional short course treatment for this pain and she will not need to be on this meds as long term.

## 2019-09-21 NOTE — Telephone Encounter (Signed)
Patient called in today to inquire about her refill.  Patient said that she was walking around [place] and is now having increased pain.  Patient is unable to come in for this.  Will forward to MD to advise.  Logan Baltimore,CMA

## 2019-09-21 NOTE — Addendum Note (Signed)
Addended by: Andrena Mews T on: 09/21/2019 01:53 PM   Modules accepted: Orders

## 2019-09-23 ENCOUNTER — Ambulatory Visit: Payer: BC Managed Care – PPO

## 2019-09-23 ENCOUNTER — Telehealth: Payer: Self-pay | Admitting: Family Medicine

## 2019-09-23 ENCOUNTER — Other Ambulatory Visit: Payer: Self-pay

## 2019-09-23 ENCOUNTER — Telehealth: Payer: Self-pay

## 2019-09-23 NOTE — Telephone Encounter (Addendum)
   Care Management   Outreach Note  09/23/2019 Name: Frances Medina MRN: [id number] DOB: 02-11-1961  Referred by: [name], MD Reason for referral : No chief complaint on file.   An unsuccessful telephone outreach was attempted today. The patient was referred to the case management team for assistance with care management and care coordination.   Follow Up Plan: A HIPPA compliant phone message was left for the patient providing contact information and requesting a return call.  The care management team will reach out to the patient again over the next 7-14 days.   [name] RN, BSN, [facility] Care Management Coordinator [place] Phone: [phone] Fax: [phone]

## 2019-09-23 NOTE — Telephone Encounter (Signed)
She called to report that her CPAP machine is faulty and had already contacted [facility] to replace it. However, her sleep study for CPAP titration is slated for next Monday.  I received a form from [facility] for her CPAP machine. However, she is yet to update her machine settings. I called and asked if she prefers that I complete the form now or defer till she gets new machine settings as her current CPAP is still functional, although less so.  She prefers to wait till she gets a new setting. I will hold on to the form.

## 2019-09-23 NOTE — Patient Instructions (Signed)
Visit Information  Goals Addressed              This Visit's Progress   .  " I want to lose weight to help me breath better" (pt-stated)        Current Barriers:  [name] Knowledge Deficits related to weight loss strategies as evidenced by weight gain reported weight 320 lbs. . Chronic Disease Management support and education needs related to weight management in patient with  OSA/Restrictive Lung Diease Nurse Case Manager Clinical Goal(s):  [name] Over the next 30 days, patient will verbalize basic understanding of plan for initiation of weight loss strategies . Over the next 90 days, patient will demonstrate improved health management independence as evidenced by keeping daily food log/diary and increasing daily water intake (patient does not have provider prescribed fluid restriction) . Over the next 30 days, patient will work with RN Case Manager to address needs related to any barriers that she may have related to weight loss  Interventions:   Evaluation of current treatment plan related to weight loss and patient's adherence to plan as established by provider . Provided verbal and/or written education to patient re: recommended life style changes: avoid fad diets, make small/incremental dietary and light exercise changes( IE.., walk around her kitchen table, sit in a chair and exercise her Medina with  small water bottles for a few minutes a day.), eat at the table and avoid eating in front of the TV, plan management of cravings, monitor snacking and cravings in food diary . Discussed plans with patient for ongoing care management follow up and provided patient with direct contact information for care management team . Reduces sodium in her diet, discussed the effects of sodium on the body. . Monitor fried foods . Weigh monthly . Be realistic on weight to lose.  Patient states 1-2 lbs to start.  . 09/23/19 . Patient returned my call and states that she is doing well. . She states that she was a  little disappointed that she she has not had her weight loss surgery yet.  She has waited over a year due to a mistake at the weight loss office. . She is going to continue her efforts with her food and exercise.  She has not drank any soda s in about 2 weeks.  Eating salads and drinking smoothies . She is so appreciative of every one in our office for all of there calls and the care they have given her.      Patient Self Care Activities:  . Performs ADL's independently . Calls provider office for new concerns or questions . Unable to independently self manage weight loss  Please see past updates related to this goal by clicking on the "Past Updates" button in the selected goal         Frances Medina was given information about Care Management services today including:  1. Care Management services include personalized support from designated clinical staff supervised by her physician, including individualized plan of care and coordination with other care providers 2. 24/7 contact phone numbers for assistance for urgent and routine care needs. 3. The patient may stop CCM services at any time (effective at the end of the month) by phone call to the office staff.  Patient agreed to services and verbal consent obtained.   The patient verbalized understanding of instructions provided today and declined a print copy of patient instruction materials.   The care management team will reach out to the patient again  over the next 21 days.   Frances Arms RN, BSN, [facility] Care Management Coordinator [place] Phone: [phone] Fax: [phone]

## 2019-09-23 NOTE — Chronic Care Management (AMB) (Signed)
Care Management   Follow Up Note   09/23/2019 Name: Frances Medina MRN: [id number] DOB: 01/01/1961  Referred by: [name], MD Reason for referral : Chronic Care Management (Obesity/RDL)   Frances Medina is a 59 y.o. year old female who is a primary care patient of [name], MD. The care management team was consulted for assistance with care management and care coordination needs.    Review of patient status, including review of consultants reports, relevant laboratory and other test results, and collaboration with appropriate care team members and the patient's provider was performed as part of comprehensive patient evaluation and provision of chronic care management services.    SDOH (Social Determinants of Health) assessments performed: No See Care Plan activities for detailed interventions related to [facility])     Advanced Directives: See Care Plan and Vynca application for related entries.   Goals Addressed              This Visit's Progress   .  " I want to lose weight to help me breath better" (pt-stated)        Current Barriers:  [name] Knowledge Deficits related to weight loss strategies as evidenced by weight gain reported weight 320 lbs. . Chronic Disease Management support and education needs related to weight management in patient with  OSA/Restrictive Lung Diease Nurse Case Manager Clinical Goal(s):  [name] Over the next 30 days, patient will verbalize basic understanding of plan for initiation of weight loss strategies . Over the next 90 days, patient will demonstrate improved health management independence as evidenced by keeping daily food log/diary and increasing daily water intake (patient does not have provider prescribed fluid restriction) . Over the next 30 days, patient will work with RN Case Manager to address needs related to any barriers that she may have related to weight loss  Interventions:   Evaluation of current treatment plan related to weight  loss and patient's adherence to plan as established by provider . Provided verbal and/or written education to patient re: recommended life style changes: avoid fad diets, make small/incremental dietary and light exercise changes( IE.., walk around her kitchen table, sit in a chair and exercise her arms with  small water bottles for a few minutes a day.), eat at the table and avoid eating in front of the TV, plan management of cravings, monitor snacking and cravings in food diary . Discussed plans with patient for ongoing care management follow up and provided patient with direct contact information for care management team . Reduces sodium in her diet, discussed the effects of sodium on the body. . Monitor fried foods . Weigh monthly . Be realistic on weight to lose.  Patient states 1-2 lbs to start.  . 09/23/19 . Patient returned my call and states that she is doing well. . She states that she was a little disappointed that she she has not had her weight loss surgery yet.  She has waited over a year due to a mistake at the weight loss office. . She is going to continue her efforts with her food and exercise.  She has not drank any soda s in about 2 weeks.  Eating salads and drinking smoothies . She is so appreciative of every one in our office for all of there calls and the care they have given her.      Patient Self Care Activities:  . Performs ADL's independently . Calls provider office for new concerns or questions . Unable  to independently self manage weight loss  Please see past updates related to this goal by clicking on the "Past Updates" button in the selected goal          The care management team will reach out to the patient again over the next 21 days.   SIGNATURE

## 2019-09-26 ENCOUNTER — Ambulatory Visit (HOSPITAL_BASED_OUTPATIENT_CLINIC_OR_DEPARTMENT_OTHER): Payer: BC Managed Care – PPO | Attending: Family Medicine | Admitting: Internal Medicine

## 2019-09-26 ENCOUNTER — Other Ambulatory Visit: Payer: Self-pay

## 2019-09-26 VITALS — Ht 60.0 in | Wt 325.0 lb

## 2019-09-26 DIAGNOSIS — G4733 Obstructive sleep apnea (adult) (pediatric): Secondary | ICD-10-CM | POA: Diagnosis not present

## 2019-10-01 ENCOUNTER — Other Ambulatory Visit: Payer: Self-pay | Admitting: Family Medicine

## 2019-10-01 ENCOUNTER — Encounter: Payer: Self-pay | Admitting: Family Medicine

## 2019-10-01 NOTE — Progress Notes (Addendum)
Form completion CPAP machine pressure per recent sleep study = 12 cmH2O Form completed based on this setting. I placed the form in the fax box in the front office to be faxed.

## 2019-10-01 NOTE — Patient Instructions (Signed)
Form completion CPAP machine pressure per recent sleep study = 14 cmH2O AHI = 10 Form completed based on this setting. I placed the form in the fax box in the front office to be faxed.

## 2019-10-02 DIAGNOSIS — G4733 Obstructive sleep apnea (adult) (pediatric): Secondary | ICD-10-CM | POA: Diagnosis not present

## 2019-10-02 DIAGNOSIS — Z9989 Dependence on other enabling machines and devices: Secondary | ICD-10-CM

## 2019-10-02 NOTE — Procedures (Signed)
Patient Name: Frances Medina, Frances Medina Date: 09/26/2019 Gender: Female D.O.B: [birth year] Age (years): 60 Referring Provider: Andrena Mews Height (inches): 60 Interpreting Physician: Baird Lyons MD, ABSM Weight (lbs): 325 RPSGT: Laren Everts BMI: 63 MRN: [id number] Neck Size: 16.00  CLINICAL INFORMATION The patient is referred for a CPAP titration to treat sleep apnea.  Date of NPSG, Split Night or HST:   NPSG 11/19/15  AHI 10/ hr, desatturation to 86%, body weight 264 lbs  SLEEP STUDY TECHNIQUE As per the AASM Manual for the Scoring of Sleep and Associated Events v2.3 (April 2016) with a hypopnea requiring 4% desaturations.  The channels recorded and monitored were frontal, central and occipital EEG, electrooculogram (EOG), submentalis EMG (chin), nasal and oral airflow, thoracic and abdominal wall motion, anterior tibialis EMG, snore microphone, electrocardiogram, and pulse oximetry. Continuous positive airway pressure (CPAP) was initiated at the beginning of the study and titrated to treat sleep-disordered breathing.  MEDICATIONS Medications self-administered by patient taken the night of the study : CETIRIZINE, SERTRALINE, MONTELUKAST, MYCOPHENOLATE MOFETIL  TECHNICIAN COMMENTS Comments added by technician: REM NOT ACHIEVED. Comments added by scorer: N/A RESPIRATORY PARAMETERS Optimal PAP Pressure (cm): 12 AHI at Optimal Pressure (/hr): 0.0 Overall Minimal O2 (%): 90.0 Supine % at Optimal Pressure (%): 100 Minimal O2 at Optimal Pressure (%): 93.0   SLEEP ARCHITECTURE The study was initiated at 9:44:35 PM and ended at 4:41:42 AM.  Sleep onset time was 44.5 minutes and the sleep efficiency was 74.3%%. The total sleep time was 310 minutes.  The patient spent 8.1%% of the night in stage N1 sleep, 91.9%% in stage N2 sleep, 0.0%% in stage N3 and 0% in REM.Stage REM latency was N/A minutes  Wake after sleep onset was 62.6. Alpha intrusion was absent. Supine sleep was  80.81%.  CARDIAC DATA The 2 lead EKG demonstrated sinus rhythm. The mean heart rate was 71.6 beats per minute. Other EKG findings include: None.  LEG MOVEMENT DATA The total Periodic Limb Movements of Sleep (PLMS) were 0. The PLMS index was 0.0. A PLMS index of <15 is considered normal in adults.  IMPRESSIONS - The optimal PAP pressure was 12 cm of water. - Central sleep apnea was not noted during this titration (CAI = 0.2/h). - Significant oxygen desaturations were not observed during this titration (min O2 = 90.0%). - No snoring was audible during this study. - No cardiac abnormalities were observed during this study. - Clinically significant periodic limb movements were not noted during this study. Arousals associated with PLMs were rare.  DIAGNOSIS - Obstructive Sleep Apnea (G47.33)  RECOMMENDATIONS - Trial of CPAP therapy on 12 cm H2O or autopap 5-15. - Patient used a Small size Resmed Full Face Mask AirFit 20 for Her mask and heated humidification. - Be careful with alcohol, sedatives and other CNS depressants that may worsen sleep apnea and disrupt normal sleep architecture. - Sleep hygiene should be reviewed to assess factors that may improve sleep quality. - Weight management and regular exercise should be initiated or continued.  [Electronically signed] 10/02/2019 11:03 AM  Baird Lyons MD, ABSM Diplomate, American Board of Sleep Medicine   NPI: [id number]                         [place]  ELECTRONICALLY SIGNED ON:  10/02/2019, 11:01 AM [place] PH: [phone]   FX: [phone] [place]

## 2019-10-05 ENCOUNTER — Telehealth: Payer: Self-pay | Admitting: *Deleted

## 2019-10-05 NOTE — Chronic Care Management (AMB) (Signed)
  Care Management   Note  10/05/2019 Name: Frances Medina MRN: [id number] DOB: [birth year]  Frances Medina is a 59 y.o. year old female who is a primary care patient of [name], MD and is actively engaged with the care management team. I reached out to Frances Medina by phone today to assist with re-scheduling a follow up visit with the RN Case Manager.  Follow up plan: Telephone appointment with care management team member scheduled for: 10/21/2019  [place], [place], [place] [address] Direct Dial: [email or web address] Website: [email or web address]

## 2019-10-07 ENCOUNTER — Encounter: Payer: Self-pay | Admitting: Family Medicine

## 2019-10-14 ENCOUNTER — Telehealth: Payer: BC Managed Care – PPO

## 2019-10-21 ENCOUNTER — Other Ambulatory Visit: Payer: Self-pay

## 2019-10-21 ENCOUNTER — Ambulatory Visit: Payer: BC Managed Care – PPO

## 2019-10-21 NOTE — Chronic Care Management (AMB) (Signed)
Care Management   Follow Up Note   10/21/2019 Name: Frances Medina MRN: [id number] DOB: 15-May-1960  Referred by: [name], MD Reason for referral : Appointment (Obesity Lauris Chroman)   [name] is a 59 y.o. year old female who is a primary care patient of [name], MD. The care management team was consulted for assistance with care management and care coordination needs.    Review of patient status, including review of consultants reports, relevant laboratory and other test results, and collaboration with appropriate care team members and the patient's provider was performed as part of comprehensive patient evaluation and provision of chronic care management services.    SDOH (Social Determinants of Health) assessments performed: No See Care Plan activities for detailed interventions related to [facility])     Advanced Directives: See Care Plan and Vynca application for related entries.   Goals Addressed              This Visit's Progress   .  " I want to lose weight to help me breath better" (pt-stated)        Current Barriers:  [name] Knowledge Deficits related to weight loss strategies as evidenced by weight gain reported weight 320 lbs. . Chronic Disease Management support and education needs related to weight management in patient with  OSA/Restrictive Lung Diease Nurse Case Manager Clinical Goal(s):  [name] Over the next 30 days, patient will verbalize basic understanding of plan for initiation of weight loss strategies . Over the next 90 days, patient will demonstrate improved health management independence as evidenced by keeping daily food log/diary and increasing daily water intake (patient does not have provider prescribed fluid restriction) . Over the next 30 days, patient will work with RN Case Manager to address needs related to any barriers that she may have related to weight loss  Interventions:   Evaluation of current treatment plan related to weight loss and  patient's adherence to plan as established by provider . Provided verbal and/or written education to patient re: recommended life style changes: avoid fad diets, make small/incremental dietary and light exercise changes( IE.., walk around her kitchen table, sit in a chair and exercise her Medina with  small water bottles for a few minutes a day.), eat at the table and avoid eating in front of the TV, plan management of cravings, monitor snacking and cravings in food diary . Discussed plans with patient for ongoing care management follow up and provided patient with direct contact information for care management team . Reduces sodium in her diet, discussed the effects of sodium on the body. . Monitor fried foods . Weigh monthly . Be realistic on weight to lose.  Patient states 1-2 lbs to start.  . 10/21/19 . Spoke with the patient today and she is doing well . She is scheduled for Bariatric classes at [facility] on the 30th of this month. . She states that she has been a little sad that she has had to wait so long with the other office she started out with for weight loss surgery. . She is still drinking her protein drinks, water and her half juice and half soda water drinks.  She has not drank a soda in a long time,  she is eating boiled eggs. . She states that she does not have much of an appetite because those things fill her up. . She tried to get some exercise in but her body hurts at time.  I advised her to  take it slow and easy.  To rest when needed.  It is a slow process and she need not overexert herself.      Patient Self Care Activities:  . Performs ADL's independently . Calls provider office for new concerns or questions . Unable to independently self manage weight loss  Please see past updates related to this goal by clicking on the "Past Updates" button in the selected goal          The care management team will reach out to the patient again over the next 14 days.   Frances Arms  RN, BSN, [facility] Care Management Coordinator [place] Phone: [phone] Fax: [phone]

## 2019-10-21 NOTE — Patient Instructions (Signed)
Visit Information  Goals Addressed              This Visit's Progress   .  " I want to lose weight to help me breath better" (pt-stated)        Current Barriers:  [name] Knowledge Deficits related to weight loss strategies as evidenced by weight gain reported weight 320 lbs. . Chronic Disease Management support and education needs related to weight management in patient with  OSA/Restrictive Lung Diease Nurse Case Manager Clinical Goal(s):  [name] Over the next 30 days, patient will verbalize basic understanding of plan for initiation of weight loss strategies . Over the next 90 days, patient will demonstrate improved health management independence as evidenced by keeping daily food log/diary and increasing daily water intake (patient does not have provider prescribed fluid restriction) . Over the next 30 days, patient will work with RN Case Manager to address needs related to any barriers that she may have related to weight loss  Interventions:   Evaluation of current treatment plan related to weight loss and patient's adherence to plan as established by provider . Provided verbal and/or written education to patient re: recommended life style changes: avoid fad diets, make small/incremental dietary and light exercise changes( IE.., walk around her kitchen table, sit in a chair and exercise her arms with  small water bottles for a few minutes a day.), eat at the table and avoid eating in front of the TV, plan management of cravings, monitor snacking and cravings in food diary . Discussed plans with patient for ongoing care management follow up and provided patient with direct contact information for care management team . Reduces sodium in her diet, discussed the effects of sodium on the body. . Monitor fried foods . Weigh monthly . Be realistic on weight to lose.  Patient states 1-2 lbs to start.  . 10/21/19 . Spoke with the patient today and she is doing well . She is scheduled for Bariatric  classes at [facility] on the 30th of this month. . She states that she has been a little sad that she has had to wait so long with the other office she started out with for weight loss surgery. . She is still drinking her protein drinks, water and her half juice and half soda water drinks.  She has not drank a soda in a long time,  she is eating boiled eggs. . She states that she does not have much of an appetite because those things fill her up. . She tried to get some exercise in but her body hurts at time.  I advised her to take it slow and easy.  To rest when needed.  It is a slow process and she need not overexert herself.      Patient Self Care Activities:  . Performs ADL's independently . Calls provider office for new concerns or questions . Unable to independently self manage weight loss  Please see past updates related to this goal by clicking on the "Past Updates" button in the selected goal          The patient verbalized understanding of instructions provided today and declined a print copy of patient instruction materials.   The care management team will reach out to the patient again over the next 14 days.   [name] RN, BSN, [facility] Care Management Coordinator [place] Phone: [phone] Fax: [phone]

## 2019-10-31 ENCOUNTER — Telehealth: Payer: Self-pay | Admitting: *Deleted

## 2019-10-31 NOTE — Chronic Care Management (AMB) (Signed)
  Care Management   Note  10/31/2019 Name: [name] MRN: [id number] DOB: [birth year]  Frances Medina is a 59 y.o. year old female who is a primary care patient of [name], MD and is actively engaged with the care management team. I reached out to Frances Medina by phone today to assist with re-scheduling a follow up visit with the RN Case Manager.  Follow up plan: Unsuccessful telephone outreach attempt made. A HIPPA compliant phone message was left for the patient providing contact information and requesting a return call. The care management team will reach out to the patient again over the next 7 days. If patient returns call to provider office, please advise to call [place] at [phone].  [place] Management

## 2019-11-02 ENCOUNTER — Other Ambulatory Visit: Payer: Self-pay | Admitting: Family Medicine

## 2019-11-04 ENCOUNTER — Telehealth: Payer: BC Managed Care – PPO

## 2019-11-08 ENCOUNTER — Encounter: Payer: Self-pay | Admitting: Family Medicine

## 2019-11-09 NOTE — Chronic Care Management (AMB) (Signed)
  Care Management   Note  11/09/2019 Name: Frances Medina MRN: [id number] DOB: 1960/04/20  Frances Medina is a 59 y.o. year old female who is a primary care patient of [name], MD and is actively engaged with the care management team. I reached out to Frances Medina by phone today to assist with re-scheduling a follow up visit with the RN Case Manager.  Follow up plan: Telephone appointment with care management team member scheduled for:11/23/2019  [place] Management

## 2019-11-23 ENCOUNTER — Telehealth: Payer: Self-pay

## 2019-11-23 ENCOUNTER — Telehealth: Payer: BC Managed Care – PPO

## 2019-11-23 NOTE — Telephone Encounter (Signed)
  Care Management   Outreach Note  11/23/2019 Name: Frances Medina MRN: [id number] DOB: 1960/03/20  Referred by: [name], MD Reason for referral : Appointment (Obesity Lauris Chroman)   An unsuccessful telephone outreach was attempted today. The patient was referred to the case management team for assistance with care management and care coordination.   Follow Up Plan: A HIPAA compliant phone message was left for the patient providing contact information and requesting a return call.  The care management team will reach out to the patient again over the next 7-14 days.   [name] RN, BSN, [facility] Care Management Coordinator [place] Phone: [phone] Fax: [phone]

## 2019-11-24 ENCOUNTER — Telehealth: Payer: Self-pay | Admitting: *Deleted

## 2019-11-24 ENCOUNTER — Other Ambulatory Visit: Payer: Self-pay | Admitting: Family Medicine

## 2019-11-24 NOTE — Chronic Care Management (AMB) (Signed)
  Care Management   Note  11/24/2019 Name: ANALISSE RANDLE MRN: [id number] DOB: 1960-12-25  [name] is a 59 y.o. year old female who is a primary care patient of [name], MD and is actively engaged with the care management team. I reached out to [name] by phone today to assist with re-scheduling a follow up visit with the RN Case Manager.  Follow up plan: Unsuccessful telephone outreach attempt made. A HIPAA compliant phone message was left for the patient providing contact information and requesting a return call.  The care management team will reach out to the patient again over the next 7 days.  If patient returns call to provider office, please advise to call [place] at [phone].  [place] Management

## 2019-11-24 NOTE — Chronic Care Management (AMB) (Signed)
  Care Management   Note  11/24/2019 Name: Frances Medina MRN: [id number] DOB: January 20, 1961  Frances Medina is a 59 y.o. year old female who is a primary care patient of [name], MD and is actively engaged with the care management team. I reached out to Frances Medina by phone today to assist with re-scheduling a follow up visit with the RN Case Manager.  Follow up plan: Telephone appointment with care management team member scheduled for:12/15/2019  [place] Management

## 2019-12-05 ENCOUNTER — Telehealth: Payer: BC Managed Care – PPO

## 2019-12-12 ENCOUNTER — Ambulatory Visit: Payer: BC Managed Care – PPO | Admitting: Family Medicine

## 2019-12-13 ENCOUNTER — Ambulatory Visit: Payer: BC Managed Care – PPO

## 2019-12-13 NOTE — Patient Instructions (Signed)
Visit Information  Goals Addressed              This Visit's Progress   .  " I want to lose weight to help me breath better" (pt-stated)   On track     Current Barriers:  [name] Knowledge Deficits related to weight loss strategies as evidenced by weight gain reported weight 320 lbs. . Chronic Disease Management support and education needs related to weight management in patient with  OSA/Restrictive Lung Diease Nurse Case Manager Clinical Goal(s):  [name] Over the next 90 days, patient will verbalize basic understanding of plan for initiation of weight loss strategies . Over the next 90 days, patient will demonstrate improved health management independence as evidenced by keeping daily food log/diary and increasing daily water intake (patient does not have provider prescribed fluid restriction) . Over the next 30 days, patient will work with RN Case Manager to address needs related to any barriers that she may have related to weight loss  Interventions:   Evaluation of current treatment plan related to weight loss and patient's adherence to plan as established by provider . Provided verbal and/or written education to patient re: recommended life style changes: avoid fad diets, make small/incremental dietary and light exercise changes( IE.., walk around her kitchen table, sit in a chair and exercise her arms with  small water bottles for a few minutes a day.), eat at the table and avoid eating in front of the TV, plan management of cravings, monitor snacking and cravings in food diary . Discussed plans with patient for ongoing care management follow up and provided patient with direct contact information for care management team . Reduces sodium in her diet, discussed the effects of sodium on the body. . Monitor fried foods . Weigh monthly . Be realistic on weight to lose.  Patient states 1-2 lbs to start.  [name] Spoke with the patient today and she stated that she is doing well,  she states tha she has  completed all of her refresher classes and has one more appointment in December at [place].  She is waiting on an appointment for her surgery. . She is drinking her smoothie, salads, and drinking seltzer water and juice.  She states drinking the seltzer water and juices makes her full and she does not want to eat but it also makes it hard for her to breath.  She has an appointment with cardiology on the 18th and she is going to talk with them about that. . She is taking her medications as prescribed and exercising by walking stairs for 30 minutes. She does this in breaks of 10 so she will not over exert herself and get short of breath. . She is scheduled to come into [facility] to get her 1st covid shot 12/19/19.     Patient Self Care Activities:  . Performs ADL's independently . Calls provider office for new concerns or questions . Unable to independently self manage weight loss  Please see past updates related to this goal by clicking on the "Past Updates" button in the selected goal         Frances Medina was given information about Care Management services today including:  1. Care Management services include personalized support from designated clinical staff supervised by her physician, including individualized plan of care and coordination with other care providers 2. 24/7 contact phone numbers for assistance for urgent and routine care needs. 3. The patient may stop CCM services at any time (effective at  the end of the month) by phone call to the office staff.  Patient agreed to services and verbal consent obtained.   The patient verbalized understanding of instructions provided today and declined a print copy of patient instruction materials.   The care management team will reach out to the patient again over the next 14 days.   [name] RN, BSN, [facility] Care Management Coordinator [place] Phone: [phone] Fax: [phone]

## 2019-12-13 NOTE — Chronic Care Management (AMB) (Signed)
Care Management   Follow Up Note   12/13/2019 Name: Frances Medina MRN: [id number] DOB: 05-30-1960  Referred by: [name], MD Reason for referral : Chronic Care Management (Obesity/RLD)   Frances Medina is a 59 y.o. year old female who is a primary care patient of [name], MD. The care management team was consulted for assistance with care management and care coordination needs.    Review of patient status, including review of consultants reports, relevant laboratory and other test results, and collaboration with appropriate care team members and the patient's provider was performed as part of comprehensive patient evaluation and provision of chronic care management services.    SDOH (Social Determinants of Health) assessments performed: No See Care Plan activities for detailed interventions related to [facility])     Advanced Directives: See Care Plan and Vynca application for related entries.   Goals Addressed              This Visit's Progress   .  " I want to lose weight to help me breath better" (pt-stated)   On track     Current Barriers:  [name] Knowledge Deficits related to weight loss strategies as evidenced by weight gain reported weight 320 lbs. . Chronic Disease Management support and education needs related to weight management in patient with  OSA/Restrictive Lung Diease Nurse Case Manager Clinical Goal(s):  [name] Over the next 90 days, patient will verbalize basic understanding of plan for initiation of weight loss strategies . Over the next 90 days, patient will demonstrate improved health management independence as evidenced by keeping daily food log/diary and increasing daily water intake (patient does not have provider prescribed fluid restriction) . Over the next 30 days, patient will work with RN Case Manager to address needs related to any barriers that she may have related to weight loss  Interventions:   Evaluation of current treatment plan related  to weight loss and patient's adherence to plan as established by provider . Provided verbal and/or written education to patient re: recommended life style changes: avoid fad diets, make small/incremental dietary and light exercise changes( IE.., walk around her kitchen table, sit in a chair and exercise her arms with  small water bottles for a few minutes a day.), eat at the table and avoid eating in front of the TV, plan management of cravings, monitor snacking and cravings in food diary . Discussed plans with patient for ongoing care management follow up and provided patient with direct contact information for care management team . Reduces sodium in her diet, discussed the effects of sodium on the body. . Monitor fried foods . Weigh monthly . Be realistic on weight to lose.  Patient states 1-2 lbs to start.  [name] Spoke with the patient today and she stated that she is doing well,  she states tha she has completed all of her refresher classes and has one more appointment in December at [place].  She is waiting on an appointment for her surgery. . She is drinking her smoothie, salads, and drinking seltzer water and juice.  She states drinking the seltzer water and juices makes her full and she does not want to eat but it also makes it hard for her to breath.  She has an appointment with cardiology on the 18th and she is going to talk with them about that. . She is taking her medications as prescribed and exercising by walking stairs for 30 minutes. She does this in breaks  of 10 so she will not over exert herself and get short of breath. . She is scheduled to come into [facility] to get her 1st covid shot 12/19/19.     Patient Self Care Activities:  . Performs ADL's independently . Calls provider office for new concerns or questions . Unable to independently self manage weight loss  Please see past updates related to this goal by clicking on the "Past Updates" button in the selected goal          The  care management team will reach out to the patient again over the next 14 days.   [name] RN, BSN, [facility] Care Management Coordinator [place] Phone: [phone] Fax: [phone]

## 2019-12-19 ENCOUNTER — Other Ambulatory Visit: Payer: Self-pay

## 2019-12-19 ENCOUNTER — Ambulatory Visit (INDEPENDENT_AMBULATORY_CARE_PROVIDER_SITE_OTHER): Payer: BC Managed Care – PPO | Admitting: Internal Medicine

## 2019-12-19 ENCOUNTER — Ambulatory Visit (INDEPENDENT_AMBULATORY_CARE_PROVIDER_SITE_OTHER): Payer: BC Managed Care – PPO | Admitting: Family Medicine

## 2019-12-19 ENCOUNTER — Encounter: Payer: Self-pay | Admitting: Internal Medicine

## 2019-12-19 VITALS — BP 128/82 | HR 70 | Wt 320.0 lb

## 2019-12-19 VITALS — BP 122/86 | HR 76 | Ht 60.0 in | Wt 320.0 lb

## 2019-12-19 DIAGNOSIS — G4733 Obstructive sleep apnea (adult) (pediatric): Secondary | ICD-10-CM | POA: Diagnosis not present

## 2019-12-19 DIAGNOSIS — Z23 Encounter for immunization: Secondary | ICD-10-CM

## 2019-12-19 DIAGNOSIS — Z9989 Dependence on other enabling machines and devices: Secondary | ICD-10-CM

## 2019-12-19 DIAGNOSIS — Z0000 Encounter for general adult medical examination without abnormal findings: Secondary | ICD-10-CM

## 2019-12-19 DIAGNOSIS — I10 Essential (primary) hypertension: Secondary | ICD-10-CM | POA: Diagnosis not present

## 2019-12-19 DIAGNOSIS — R079 Chest pain, unspecified: Secondary | ICD-10-CM | POA: Diagnosis not present

## 2019-12-19 DIAGNOSIS — G7000 Myasthenia gravis without (acute) exacerbation: Secondary | ICD-10-CM

## 2019-12-19 NOTE — Progress Notes (Signed)
    SUBJECTIVE:   CHIEF COMPLAINT / HPI: immunizations  Patient presents today for flu shot and 1st COVID-19 shot. She has several allergies to medications, however none of the allergies are to any of the components of either vaccination. She saw her cardiologist earlier today and is doing well. No new signs or symptoms of infection symptoms, feels in her usual state of health. Will give both flu shot and COVID-19 injections today and monitor for 30 min to ensure no anaphylactic response.  PERTINENT  PMH / PSH: many allergies to medications  OBJECTIVE:   BP 128/82   Pulse 70   Wt (!) 320 lb (145.2 kg)   SpO2 99%   BMI 62.50 kg/m   Physical Exam Vitals and nursing note reviewed.  Constitutional:      General: She is not in acute distress.    Appearance: Normal appearance. She is obese. She is not ill-appearing, toxic-appearing or diaphoretic.  HENT:     Head: Normocephalic and atraumatic.  Cardiovascular:     Rate and Rhythm: Normal rate and regular rhythm.     Pulses: Normal pulses.     Heart sounds: Normal heart sounds.  Pulmonary:     Effort: Pulmonary effort is normal.     Breath sounds: Normal breath sounds.  Skin:    General: Skin is warm and dry.  Neurological:     General: No focal deficit present.     Mental Status: She is alert. Mental status is at baseline.  Psychiatric:        Mood and Affect: Mood normal.        Behavior: Behavior normal.    ASSESSMENT/PLAN:   Healthcare maintenance Flu vaccine and first COVID-19 immunization give today. Patient tolerated well with no adverse symptoms. Monitored for over 30 minutes. 2nd COVID-19 shot in 2 weeks.     Gladys Damme, MD [place]

## 2019-12-19 NOTE — Progress Notes (Signed)
Cardiology Office Note:    Date:  12/19/2019   ID:  [name], DOB 1960-03-07, MRN [id number]  PCP:  [name], MD  Cardiologist:  No primary care provider on file.  Electrophysiologist:  None   Referring MD: [name], MD   Chief Complaint/Reason for Referral: Follow-up chest pain  History of Present Illness:    Frances Medina is a 59 y.o. female with a history of myasthenia gravis on mycophenolate therapy, morbid obesity, obstructive sleep apnea, restrictive lung disease by pulmonary function testing likely secondary to obesity, and anxiety.   Epigastric pain gone after stopping ibuprofen.    Stress test low risk.   Abnormality noted on stress most likely due to artifact given body habitus.  She is in good spirits as always and notes that her chest pain is most consistent with acid reflux.  She has had no exertional chest discomfort.  She is trying to exercise and while her weight has not decreased, she is doing the exercises recommended to her by her bariatric surgery program.  She is seeking evaluation at [facility] for bariatric surgery and is currently on a wait list.   Past Medical History:  Diagnosis Date  . Acute respiratory failure ([place])   . Acute respiratory failure with hypoxemia ([place])   . Aspiration into airway   . Back pain 07/31/2017  . Benign neoplasm of rectum   . CAP (community acquired pneumonia) 08/05/2015  . Depression with anxiety   . Diverticulosis   . Dysphagia 03/2015    EGD, Dr Carlean Purl. mild antral gastritis, ? due to Ibuprofen.  no stricture but empirically maloney dilated esophagus.   [name] Dysphagia, neurologic   . E. coli UTI 04/07/2015  . Elevated LDH 05/06/2015  . Encounter for routine gynecological examination 10/19/2015  . Endotracheally intubated   . Enteritis due to Clostridium difficile   . Esophageal dysmotility 11/08/2015  . Fibroid uterus    size of a dime  . Gastrostomy infection ([place]) 11/08/2015  . Gastrostomy infection ([place])  11/08/2015  . GERD (gastroesophageal reflux disease)    hiatal hernia  . Hypertension    "went away when I stopped smoking"  . Knee injury   . Left hip pain 02/18/2017  . Migraine    "maybe couple times/month" (03/20/2015)  . Mild intermittent asthma 06/22/2017  . Myasthenia gravis ([place]) 2017  . Myasthenia gravis with acute exacerbation ([place]) 05/15/2015  . Osteoarthritis of left knee 12/02/2013  . Osteoarthritis of right knee 08/30/2013  . Pancreatitis 07/25/2017  . Protein-calorie malnutrition, severe ([place]) 08/07/2015  . Seasonal allergies    takes Zytrec  . Tracheostomy status ([place])   . Tubular adenoma of colon   . Tumors    "in my stomach"  . Umbilical hernia    watching , no plans for surgery at present  . Urine incontinence 10/19/2015  . VAP (ventilator-associated pneumonia) [facility])     Past Surgical History:  Procedure Laterality Date  . BIOPSY  07/05/2019   Procedure: BIOPSY;  Surgeon: Ladene Artist, MD;  Location: [facility];  Service: Endoscopy;;  . [place]; 1989  . COLONOSCOPY WITH PROPOFOL N/A 09/19/2013   Procedure: COLONOSCOPY WITH PROPOFOL;  Surgeon: Ladene Artist, MD;  Location: [facility] END[facility];  Service: Endoscopy;  Laterality: N/A;  . COLONOSCOPY WITH PROPOFOL N/A 07/05/2019   Procedure: COLONOSCOPY WITH PROPOFOL;  Surgeon: Ladene Artist, MD;  Location: [facility] END[facility];  Service: Endoscopy;  Laterality: N/A;  . DILATION AND  CURETTAGE OF UTERUS    . ESOPHAGOGASTRODUODENOSCOPY N/A 08/10/2015   Procedure: ESOPHAGOGASTRODUODENOSCOPY (EGD);  Surgeon: Gatha Mayer, MD;  Location[facility] END[facility]COPY;  Service: Endoscopy;  Laterality: N/A;  . ESOPHAGOGASTRODUODENOSCOPY (EGD) WITH PROPOFOL N/A 03/21/2015   Procedure: ESOPHAGOGASTRODUODENOSCOPY (EGD) WITH PROPOFOL;  Surgeon: Gatha Mayer, MD;  Location: [facility];  Service: Endoscopy;  Laterality: N/A;  . ESOPHAGOGASTRODUODENOSCOPY (EGD) WITH PROPOFOL N/A 07/05/2019   Procedure: ESOPHAGOGASTRODUODENOSCOPY (EGD) WITH  PROPOFOL;  Surgeon: Ladene Artist, MD;  Location: [facility] END[facility];  Service: Endoscopy;  Laterality: N/A;  . PARTIAL KNEE ARTHROPLASTY Right 08/30/2013   Procedure: RIGHT UNICOMPARTMENTAL KNEE;  Surgeon: Johnny Bridge, MD;  Location: [facility];  Service: Orthopedics;  Laterality: Right;  . PARTIAL KNEE ARTHROPLASTY Left 12/02/2013   Procedure: LEFT KNEE UNI ARTHROPLASTY;  Surgeon: Johnny Bridge, MD;  Location: [facility];  Service: Orthopedics;  Laterality: Left;  . PEG PLACEMENT N/A 08/10/2015   Procedure: PERCUTANEOUS ENDOSCOPIC GASTROSTOMY (PEG) PLACEMENT;  Surgeon: Gatha Mayer, MD;  Location: [facility];  Service: Endoscopy;  Laterality: N/A;  . POLYPECTOMY  07/05/2019   Procedure: POLYPECTOMY;  Surgeon: Ladene Artist, MD;  Location: [facility] END[facility];  Service: Endoscopy;;  . TUBAL LIGATION  1989  . VAGINAL HYSTERECTOMY  1990's?   "apparently took out one of my ovaries at the time too cause one's missing"    Current Medications: Current Meds  Medication Sig  . albuterol (PROVENTIL) (2.5 MG/3ML) 0.083% nebulizer solution USE 1 VIAL IN NEBULIZER EVERY 6 HOURS AS NEEDED FOR SHORTNESS OF BREATH AND WHEEZING  . albuterol (VENTOLIN HFA) 108 (90 Base) MCG/ACT inhaler INHALE 2 PUFFS INTO THE LUNGS EVERY 6 HOURS AS NEEDED FOR WHEEZE OR SHORTNESS OF BREATH  . Ascorbic Acid (VITAMIN C PO) Take 1 tablet by mouth daily.  . benzonatate (TESSALON) 100 MG capsule TAKE 1 CAPSULE BY MOUTH TWICE A DAY AS NEEDED FOR COUGH  . cetirizine (ZYRTEC) 10 MG tablet Take 1 tablet (10 mg total) by mouth daily. No Therapeutic Substitution  . clonazePAM (KLONOPIN) 0.5 MG tablet Take 1 tablet (0.5 mg total) by mouth 2 (two) times daily as needed for anxiety.  . Cyanocobalamin (VITAMIN B12 PO) Take 1 tablet by mouth daily.  [name] docusate sodium (COLACE) 100 MG capsule Take 1 capsule (100 mg total) by mouth 2 (two) times daily as needed for mild constipation or moderate constipation.  . ferrous sulfate 325 (65  FE) MG tablet Take 1 tablet (325 mg total) by mouth daily.  . fluticasone (FLONASE) 50 MCG/ACT nasal spray Place 2 sprays into both nostrils daily.  [name] guaiFENesin (ROBITUSSIN) 100 MG/5ML SOLN Take 15 mLs by mouth daily as needed for cough or to loosen phlegm.  . Menthol (COUGH DROPS) 10 MG LOZG Take 1 lozenge by mouth daily as needed (cough).  . montelukast (SINGULAIR) 10 MG tablet Take 1 tablet (10 mg total) by mouth at bedtime.  . mycophenolate (CELLCEPT) 500 MG tablet Take 1,000 mg by mouth 2 (two) times daily.  [name] omeprazole (PRILOSEC) 20 MG capsule Take 1 capsule (20 mg total) by mouth daily.  . sertraline (ZOLOFT) 50 MG tablet TAKE 3 TABLETS BY MOUTH EVERY DAY (Patient taking differently: Take 50 mg by mouth 3 (three) times daily. )  . VITAMIN D, CHOLECALCIFEROL, PO Take 1 tablet by mouth daily.     Allergies:   Ciprofloxacin, Naproxen, Shrimp [shellfish allergy], Dicyclomine, Methocarbamol, Penicillins, Pineapple, and Morphine and related   Social History   Tobacco Use  .  Smoking status: Former Smoker    Packs/day: 1.50    Years: 38.00    Pack years: 57.00    Types: Cigarettes    Start date: 03/03/1974    Quit date: 05/14/2012    Years since quitting: 7.6  . Smokeless tobacco: Never Used  . Tobacco comment: denies thoughts of restarting  Vaping Use  . Vaping Use: Former  Substance Use Topics  . Alcohol use: No    Alcohol/week: 0.0 standard drinks  . Drug use: No     Family History: The patient's family history includes Anemia in her sister; COPD in her brother; Heart disease (age of onset: 55) in her mother; Hypertension in her father and mother; Leukemia in her sister and sister; Other in her sister; Stroke in her father. There is no history of Colon cancer.  ROS:   Please see the history of present illness.    All other systems reviewed and are negative.  EKGs/Labs/Other Studies Reviewed:    The following studies were reviewed today:  EKG: Normal sinus rhythm   Recent Labs: 05/02/2019: Hemoglobin 11.9; Platelets 248; TSH 3.690 08/26/2019: ALT 10; BUN 11; Creatinine, Ser 0.71; Potassium 4.3; Sodium 141  Recent Lipid Panel    Component Value Date/Time   CHOL 161 07/25/2017 1846   TRIG 44 07/25/2017 1846   HDL 60 07/25/2017 1846   CHOLHDL 2.7 07/25/2017 1846   VLDL 9 07/25/2017 1846   LDLCALC 92 07/25/2017 1846    Physical Exam:    VS:  BP 122/86 (BP Location: Left Arm, Patient Position: Sitting, Cuff Size: Large)   Pulse 76   Ht 5' (1.524 m)   Wt (!) 320 lb (145.2 kg)   BMI 62.50 kg/m     Wt Readings from Last 5 Encounters:  12/19/19 (!) 320 lb (145.2 kg)  09/26/19 (!) 325 lb (147.4 kg)  08/26/19 (!) 322 lb 9.6 oz (146.3 kg)  05/30/19 (!) 328 lb (148.8 kg)  05/27/19 (!) 326 lb 12.8 oz (148.2 kg)    Constitutional: No acute distress Eyes: sclera non-icteric, normal conjunctiva and lids ENMT: normal dentition, moist mucous membranes Cardiovascular: regular rhythm, normal rate, no murmurs. S1 and S2 normal. Radial pulses normal bilaterally. No jugular venous distention.  Respiratory: clear to auscultation bilaterally GI : normal bowel sounds, soft and nontender. No distention.   MSK: extremities warm, well perfused. No edema.  NEURO: grossly nonfocal exam, moves all extremities. PSYCH: alert and oriented x 3, normal mood and affect.   ASSESSMENT:    1. Chest pain, unspecified type   2. Morbid obesity ([place])   3. Essential hypertension   4. OSA on CPAP   5. Myasthenia gravis ([place])    PLAN:    Chest pain, unspecified type Epigastric pain Shortness of breath -Stress test was overall low risk.  She continues to have mild epigastric and chest discomfort which she feels is entirely representative of acid reflux.  I discussed precautions to return to the office for further evaluation as well as red flag symptoms to go to the ER.   Morbid obesity (HCC)-currently undergoing evaluation at [facility] for bariatric surgery.   Essential  hypertension-not currently on antihypertensive therapy.  Blood pressure well controlled today, observe   OSA on CPAP-encourage compliance   Myasthenia gravis (HCC)-stable per PCP  Total time of encounter: 20 minutes total time of encounter, including 15 minutes spent in face-to-face patient care on the date of this encounter. This time includes coordination of care and counseling regarding above  mentioned problem list. Remainder of non-face-to-face time involved reviewing chart documents/testing relevant to the patient encounter and documentation in the medical record. I have independently reviewed documentation from referring provider.   Cherlynn Kaiser, MD [place]  [facility]    Medication Adjustments/Labs and Tests Ordered: Current medicines are reviewed at length with the patient today.  Concerns regarding medicines are outlined above.   Orders Placed This Encounter  Procedures  . EKG 12-Lead    No orders of the defined types were placed in this encounter.   Patient Instructions  Medication Instructions:  No Changes In Medications at this time.  *If you need a refill on your cardiac medications before your next appointment, please call your pharmacy*  Lab Work: None Ordered At This Time.  If you have labs (blood work) drawn today and your tests are completely normal, you will receive your results only by: [name] MyChart Message (if you have MyChart) OR . A paper copy in the mail If you have any lab test that is abnormal or we need to change your treatment, we will call you to review the results.  Testing/Procedures: None Ordered At This Time.   Follow-Up: At [facility], you and your health needs are our priority.  As part of our continuing mission to provide you with exceptional heart care, we have created designated Provider Care Teams.  These Care Teams include your primary Cardiologist (physician) and Advanced Practice Providers (APPs -  Physician Assistants and  Nurse Practitioners) who all work together to provide you with the care you need, when you need it.  Your next appointment:   6 month(s)  The format for your next appointment:   In Person  Provider:   Cherlynn Kaiser, MD

## 2019-12-19 NOTE — Patient Instructions (Signed)
Medication Instructions:  No Changes In Medications at this time.  *If you need a refill on your cardiac medications before your next appointment, please call your pharmacy*  Lab Work: None Ordered At This Time.  If you have labs (blood work) drawn today and your tests are completely normal, you will receive your results only by: . MyChart Message (if you have MyChart) OR . A paper copy in the mail If you have any lab test that is abnormal or we need to change your treatment, we will call you to review the results.  Testing/Procedures: None Ordered At This Time.   Follow-Up: At [facility], you and your health needs are our priority.  As part of our continuing mission to provide you with exceptional heart care, we have created designated Provider Care Teams.  These Care Teams include your primary Cardiologist (physician) and Advanced Practice Providers (APPs -  Physician Assistants and Nurse Practitioners) who all work together to provide you with the care you need, when you need it.  Your next appointment:   6 month(s)  The format for your next appointment:   In Person  Provider:   Gayatri Acharya, MD   

## 2019-12-19 NOTE — Assessment & Plan Note (Signed)
Flu vaccine and first COVID-19 immunization give today. Patient tolerated well with no adverse symptoms. Monitored for over 30 minutes. 2nd COVID-19 shot in 2 weeks.

## 2019-12-21 ENCOUNTER — Telehealth: Payer: Self-pay | Admitting: Family Medicine

## 2019-12-21 NOTE — Telephone Encounter (Signed)
Hello Traci,  Can you help Ms. Scholler obtain a new CPAP machine. I placed an order a while back. Thanks.

## 2019-12-23 ENCOUNTER — Ambulatory Visit: Payer: BC Managed Care – PPO

## 2019-12-23 ENCOUNTER — Other Ambulatory Visit: Payer: Self-pay | Admitting: Family Medicine

## 2019-12-23 DIAGNOSIS — G4733 Obstructive sleep apnea (adult) (pediatric): Secondary | ICD-10-CM

## 2019-12-23 NOTE — Chronic Care Management (AMB) (Signed)
   RNCM Care Management Collaboration 12/23/2019 Name: Frances Medina MRN: [id number] DOB: 1960/03/13   Frances Medina is a 59 y.o. year old female who sees [name], MD for primary care. RNCM was consulted by Dr Gwendlyn Deutscher to assistance patient with  Care Coordination for CIPAP.     Intervention: Patient was not interviewed or contacted during this encounter. Review of patient status, including review of consultants reports, relevant laboratory and other test results, and collaboration with appropriate care team members and the patient's provider was performed as part of comprehensive patient evaluation and provision of chronic care management services.  RNCM sent a message to [employer] informing them that the patient had a sleep study done in July of this year and the PCP had faxed in the orders for a new CIPAP.  The patient had the old one for over 5 years.  RNCM received message back from [facility] that he did not see orders for the patient.    Plan: 1. RNCN sent staff message  PCP with information and asking for another order for CIPAP.  Once in the system I will contact [employer] so they can pull the order and get the process started for the patient.  [name] RN, BSN, [facility] Care Management Coordinator [place] Phone: [phone] Fax: [phone]

## 2019-12-27 ENCOUNTER — Ambulatory Visit: Payer: BC Managed Care – PPO

## 2019-12-27 NOTE — Chronic Care Management (AMB) (Signed)
RN  Care Management   Follow Up Note   12/27/2019 Name: Frances Medina MRN: [id number] DOB: 1960/05/16  Reason for referral : Appointment (Obesity/RLD)   Frances Medina is a 59 y.o. year old female who is a primary care patient of Frances Feil, MD. The care management team was consulted for assistance with care management and care coordination needs.    Subjective: " I am doing well just a little tired"   Assessment: Called the the patient to follow up on her weight loss goals. She is doing well.  Goals Addressed              This Visit's Progress   .  " I want to lose weight to help me breath better" (pt-stated)        Current Barriers:  Frances Medina Knowledge Deficits related to weight loss strategies as evidenced by weight gain reported weight 320 lbs. . Chronic Disease Management support and education needs related to weight management in patient with  OSA/Restrictive Lung Diease Nurse Case Manager Clinical Goal(s):  Frances Medina Over the next 90 days, patient will verbalize basic understanding of plan for initiation of weight loss strategies . Over the next 90 days, patient will demonstrate improved health management independence as evidenced by keeping daily food log/diary and increasing daily water intake (patient does not have provider prescribed fluid restriction) . Over the next 30 days, patient will work with RN Case Manager to address needs related to any barriers that she may have related to weight loss  Interventions:   Evaluation of current treatment plan related to weight loss and patient's adherence to plan as established by provider . Provided verbal and/or written education to patient re: recommended life style changes: avoid fad diets, make small/incremental dietary and light exercise changes( IE.., walk around her Medina table, sit in a chair and exercise her arms with  small water bottles for a few minutes a day.), eat at the table and avoid eating in front of the TV, plan  management of cravings, monitor snacking and cravings in food diary . Discussed plans with patient for ongoing care management follow up and provided patient with direct contact information for care management team . Reduces sodium in her diet, discussed the effects of sodium on the body. . Monitor fried foods . Weigh monthly . Be realistic on weight to lose.  Patient states 1-2 lbs to start.  Frances Medina Spoke with the patient today and she stated that she is doing well,  She continues to follow her diet and exercise.  She is measuring her food and needs to send in a food plan in. Congratulated her on her continued efforts. . Taking her medications as prescribed staying focused on her goal . Informed the patient that a new order was sent in to [employer] for her new CIPAP and she should here something this week.  I received a message that they received it .  If she does not I have asked that she inform me and let me follow up on it.   Patient Self Care Activities:  . Performs ADL's independently . Calls provider office for new concerns or questions . Unable to independently self manage weight loss  Please see past updates related to this goal by clicking on the "Past Updates" button in the selected goal          Review of patient status, including review of consultants reports, relevant laboratory and other test results, and collaboration with  appropriate care team members and the patient's provider was performed as part of comprehensive patient evaluation and provision of chronic care management services.    SDOH (Social Determinants of Health) assessments performed: No See Care Plan activities for detailed interventions related to SDOH)         Plan: Telephone follow up with Frances Medina over the next 21 days  [name] RN, BSN, [place] Phone: [phone] Fax: [phone]

## 2019-12-27 NOTE — Patient Instructions (Addendum)
Visit Information  Goals    .  " I want to lose weight to help me breath better" (pt-stated)      Current Barriers:  [name] Knowledge Deficits related to weight loss strategies as evidenced by weight gain reported weight 320 lbs. . Chronic Disease Management support and education needs related to weight management in patient with  OSA/Restrictive Lung Diease Nurse Case Manager Clinical Goal(s):  [name] Over the next 90 days, patient will verbalize basic understanding of plan for initiation of weight loss strategies . Over the next 90 days, patient will demonstrate improved health management independence as evidenced by keeping daily food log/diary and increasing daily water intake (patient does not have provider prescribed fluid restriction) . Over the next 30 days, patient will work with RN Case Manager to address needs related to any barriers that she may have related to weight loss  Interventions:   Evaluation of current treatment plan related to weight loss and patient's adherence to plan as established by provider . Provided verbal and/or written education to patient re: recommended life style changes: avoid fad diets, make small/incremental dietary and light exercise changes( IE.., walk around her kitchen table, sit in a chair and exercise her arms with  small water bottles for a few minutes a day.), eat at the table and avoid eating in front of the TV, plan management of cravings, monitor snacking and cravings in food diary . Discussed plans with patient for ongoing care management follow up and provided patient with direct contact information for care management team . Reduces sodium in her diet, discussed the effects of sodium on the body. . Monitor fried foods . Weigh monthly . Be realistic on weight to lose.  Patient states 1-2 lbs to start.  [name] Spoke with the patient today and she stated that she is doing well,  She continues to follow her diet and exercise.  She is measuring her food and  needs to send in a food plan in. Congratulated her on her continued efforts. . Taking her medications as prescribed staying focused on her goal . Informed the patient that a new order was sent in to [employer] for her new CIPAP and she should here something this week.  I received a message that they received it .  If she does not I have asked that she inform me and let me follow up on it.   Patient Self Care Activities:  . Performs ADL's independently . Calls provider office for new concerns or questions . Unable to independently self manage weight loss  Please see past updates related to this goal by clicking on the "Past Updates" button in the selected goal          Ms. Muto was given information about Care Management services today including:  1. Care Management services include personalized support from designated clinical staff supervised by her physician, including individualized plan of care and coordination with other care providers 2. 24/7 contact phone numbers for assistance for urgent and routine care needs. 3. The patient may stop CCM services at any time (effective at the end of the month) by phone call to the office staff.  Patient agreed to services and verbal consent obtained.   The patient verbalized understanding of instructions provided today and declined a print copy of patient instruction materials.   Plan: Telephone follow up with [name] over the next 21 days  [name] RN, BSN, [place]  Phone: [phone] Fax: [phone]

## 2020-01-16 ENCOUNTER — Ambulatory Visit (INDEPENDENT_AMBULATORY_CARE_PROVIDER_SITE_OTHER): Payer: BC Managed Care – PPO

## 2020-01-16 ENCOUNTER — Other Ambulatory Visit: Payer: Self-pay

## 2020-01-16 DIAGNOSIS — Z23 Encounter for immunization: Secondary | ICD-10-CM | POA: Diagnosis not present

## 2020-01-16 NOTE — Progress Notes (Signed)
   Covid-19 Vaccination Clinic  Name:  Frances Medina    MRN: [id number] DOB: May 20, 1960  01/16/2020   Patient presents to nurse clinic for second Tibur[place] vaccination. Patient denies previous allergic reaction and answers no to all screening questions. Administered in RD, site unremarkable, tolerated injection well.   Ms. Moravek was observed post Covid-19 immunization for 15 minutes without incident. She was provided with Vaccine Information Sheet and instruction to access the V-Safe system.   Ms. Donner was instructed to call 911 with any severe reactions post vaccine: Difficulty breathing  Swelling of face and throat  A fast heartbeat  A bad rash all over body  Dizziness and weakness    Provided patient with updated immunization card and record.   Talbot Grumbling, RN

## 2020-01-17 ENCOUNTER — Telehealth: Payer: Self-pay

## 2020-01-17 ENCOUNTER — Telehealth: Payer: BC Managed Care – PPO

## 2020-01-17 NOTE — Telephone Encounter (Signed)
  Care Management   Outreach Note  01/17/2020 Name: Frances Medina MRN: [id number] DOB: Nov 05, 1960  Referred by: [name], MD Reason for referral : Chronic Care Management (Obesity RLD)   An unsuccessful telephone outreach was attempted today. The patient was referred to the case management team for assistance with care management and care coordination.   Follow Up Plan: A HIPAA compliant phone message was left for the patient providing contact information and requesting a return call.  The care management team will reach out to the patient again over the next 7-14 days.   [name] RN, BSN, [facility] Care Management Coordinator [place] Phone: [phone] Fax: [phone]

## 2020-01-18 ENCOUNTER — Telehealth: Payer: Self-pay | Admitting: *Deleted

## 2020-01-18 NOTE — Chronic Care Management (AMB) (Signed)
  Care Management   Note  01/18/2020 Name: [name] MRN: [id number] DOB: [birth year]  Frances Medina is a 59 y.o. year old female who is a primary care patient of [name], MD and is actively engaged with the care management team. I reached out to Frances Medina by phone today to assist with re-scheduling a follow up visit with the RN Case Manager.  Follow up plan: Unsuccessful telephone outreach attempt made. A HIPAA compliant phone message was left for the patient providing contact information and requesting a return call.  The care management team will reach out to the patient again over the next 7 days.  If patient returns call to provider office, please advise to call [place] at [place] Management

## 2020-01-21 ENCOUNTER — Other Ambulatory Visit: Payer: Self-pay | Admitting: Family Medicine

## 2020-01-25 NOTE — Chronic Care Management (AMB) (Signed)
  Care Management   Note  01/25/2020 Name: Frances Medina MRN: [id number] DOB: 15-Feb-1961  Frances Medina is a 59 y.o. year old female who is a primary care patient of [name], MD and is actively engaged with the care management team. I reached out to Frances Medina by phone today to assist with re-scheduling a follow up visit with the RN Case Manager.  Follow up plan: Telephone appointment with care management team member scheduled for:02/13/2020  [place] [facility]

## 2020-01-25 NOTE — Telephone Encounter (Signed)
Called spoke with patient rescheduled for 02/13/2020

## 2020-02-13 ENCOUNTER — Ambulatory Visit: Payer: BC Managed Care – PPO

## 2020-02-13 NOTE — Patient Instructions (Signed)
Visit Information  Goals Addressed            This Visit's Progress   . Develop a Weight Loss Readiness Plan       Timeframe:  Long-Range Goal Priority:  Medium Start Date:      02/13/20                       Expected End Date:        03/05/20               Follow Up Date 03/05/20   Patient Goals/Self Care Activities:  . Performs ADL's independently . Calls provider office for new concerns or questions . get rid of junk food . identify pros and cons of weight loss . identify what might get in the way of success . list ways to deal with barriers to success . stock up on healthy food choices . track feelings, emotional and physical, when eating . track current amount and type of exercise . track what and where I am eating     Why is this important?    Losing weight requires time to prepare your mind and your home.   Identify your eating habits and the emotions attached to eating.   Think about ways to be successful.   When you are not ready, you could lose motivation and give up on your plan.     Notes: Patient is working with weight loss center to have Bariatric surgery.       Frances Medina was given information about Care Management services today including:  1. Care Management services include personalized support from designated clinical staff supervised by her physician, including individualized plan of care and coordination with other care providers 2. 24/7 contact phone numbers for assistance for urgent and routine care needs. 3. The patient may stop CCM services at any time (effective at the end of the month) by phone call to the office staff.  Patient agreed to services and verbal consent obtained.   The patient verbalized understanding of instructions, educational materials, and care plan provided today and declined offer to receive copy of patient instructions, educational materials, and care plan.   The care management team will reach out to the patient  again over the next 30 days.   [name] RN, BSN, [facility] Care Management Coordinator [place] Phone: [phone] I Fax: [phone]

## 2020-02-13 NOTE — Chronic Care Management (AMB) (Signed)
RN  Care Management   Follow Up Note   02/13/2020 Name: Frances Medina MRN: [id number] DOB: [birth year]  Reason for referral : Chronic Care Management (Obesity/RLD)   Frances Medina is a 59 y.o. year old female who is a primary care patient of [name], MD. The care management team was consulted for assistance with care management and care coordination needs.      Assessment: Called to follow up with the patient about her weight loss.  The patient states that she is doing fine continuing to exercise and eat right.  Patient Care Plan: RN Case Manager  Problem Identified: Weight Management (Obesity)   Long-Range Goal: Weight Loss   Start Date: 03/22/2019  Priority: Medium  Note:   Current Barriers:  [name] Knowledge Deficits related to weight loss strategies as evidenced by weight gain reported weight 315 lbs. . Chronic Disease Management support and education needs related to weight management in patient with OSA/Restrictive Lung Diease Nurse Case Manager Clinical Goal(s):  [name] Over the next 90 days, patient will verbalize basic understanding of plan for initiation of weight loss strategies . Over the next 90 days, patient will demonstrate improved health management independence as evidenced by keeping daily food log/diary and increasing daily water intake (patient does not have provider prescribed fluid restriction) . Over the next 30 days, patient will work with RN Case Manager to address needs related to any barriers that she may have related to weight loss  Interventions:   Evaluation of current treatment plan related to weight loss and patient's adherence to plan as established by provider . Provided verbal and/or written education to patient re: recommended life style changes: avoid fad diets, make small/incremental dietary and light exercise changes( IE.., walk around her kitchen table, sit in a chair and exercise her arms with  small water bottles for a few minutes a day.),  eat at the table and avoid eating in front of the TV, plan management of cravings, monitor snacking and cravings in food diary . Discussed plans with patient for ongoing care management follow up and provided patient with direct contact information for care management team . Reduces sodium in her diet, discussed the effects of sodium on the body. . Weigh monthly . Be realistic on weight to lose.  Patient states 1-2 lbs to start. [name] activation or motivation to change monitored . activity and exercise based on tolerance encouraged . barriers to physical activity or exercise addressed . encouragement and support provided . participation in weight loss program or support group encouraged . success praised . Spoke with the patient today and she stated that she is doing well,  She continues to follow her diet and exercise.  She has an appointment with Neurology  to get the approval for her weight loss surgery. . Taking her medications as prescribed staying focused on her goal . Patient has been contacted about her CPAP and is waiting for the machine.  Patient Goals/Self Care Activities:  . Performs ADL's independently . Calls provider office for new concerns or questions . get rid of junk food . identify pros and cons of weight loss . identify what might get in the way of success . list ways to deal with barriers to success . stock up on healthy food choices . track feelings, emotional and physical, when eating . track current amount and type of exercise . track what and where I am eating   Follow up Plan: The care management team  will reach out to the patient again over the next 30 days.        Review of patient status, including review of consultants reports, relevant laboratory and other test results, and collaboration with appropriate care team members and the patient's provider was performed as part of comprehensive patient evaluation and provision of chronic care management  services.    SDOH (Social Determinants of Health) assessments performed: No See Care Plan activities for detailed interventions related to SDOH)     [place], BSN, [place] Management Coordinator [place] Phone: [phone] Fax: [phone]

## 2020-03-05 ENCOUNTER — Telehealth: Payer: Self-pay | Admitting: *Deleted

## 2020-03-05 ENCOUNTER — Telehealth: Payer: BC Managed Care – PPO

## 2020-03-05 NOTE — Chronic Care Management (AMB) (Signed)
  Care Management   Note  03/05/2020 Name: Frances Medina MRN: [id number] DOB: 1960-08-30  Frances Medina is a 60 y.o. year old female who is a primary care patient of Doreene Eland, MD and is actively engaged with the care management team. I reached out to Frances Medina by phone today to assist with re-scheduling a follow up visit with the RN Case Manager  Follow up plan: Unsuccessful telephone outreach attempt made. A HIPAA compliant phone message was left for the patient providing contact information and requesting a return call. The care management team will reach out to the patient again over the next 7 days. If patient returns call to provider office, please advise to call Embedded Care Management Care Guide Gwenevere Ghazi at [phone].  Gwenevere Ghazi  Care Guide, Embedded Care Coordination [facility] Management

## 2020-03-06 ENCOUNTER — Encounter: Payer: Self-pay | Admitting: Family Medicine

## 2020-03-12 NOTE — Chronic Care Management (AMB) (Signed)
  Care Management   Note  03/12/2020 Name: HAGAR SADIQ MRN: [id number] DOB: 07/30/1960  [name] is a 60 y.o. year old female who is a primary care patient of [name], MD and is actively engaged with the care management team. I reached out to [name] by phone today to assist with re-scheduling a follow up visit with the RN Case Manager.  Follow up plan: Telephone appointment with care management team member scheduled for:03/19/2020  [place] Management

## 2020-03-13 ENCOUNTER — Encounter: Payer: Self-pay | Admitting: Family Medicine

## 2020-03-19 ENCOUNTER — Other Ambulatory Visit: Payer: Self-pay

## 2020-03-19 ENCOUNTER — Telehealth: Payer: Medicare Other

## 2020-03-22 ENCOUNTER — Telehealth: Payer: Self-pay

## 2020-03-22 ENCOUNTER — Telehealth: Payer: Medicare Other

## 2020-03-22 NOTE — Telephone Encounter (Signed)
  Care Management   Outreach Note  03/22/2020 Name: Frances Medina MRN: [id number] DOB: [birth year]  Referred by: [name], MD Reason for referral : Chronic Care Management (Obesity  RLD)   Frances Medina is enrolled in a Managed Medicaid Health Plan: No  An unsuccessful telephone outreach was attempted today. The patient was referred to the case management team for assistance with care management and care coordination.   Follow Up Plan: A HIPAA compliant phone message was left for the patient providing contact information and requesting a return call.  The care management team will reach out to the patient again over the next 7-14 days.   [name] RN, BSN, [facility] Care Management Coordinator [place] Phone: [phone] I Fax: [phone]

## 2020-03-23 NOTE — Telephone Encounter (Signed)
Rescheduled for 03/30/2020 pt was helping mother in law when you called said she is sorry she missed your call

## 2020-03-26 DIAGNOSIS — I10 Essential (primary) hypertension: Secondary | ICD-10-CM | POA: Diagnosis not present

## 2020-03-26 DIAGNOSIS — G4733 Obstructive sleep apnea (adult) (pediatric): Secondary | ICD-10-CM | POA: Diagnosis not present

## 2020-03-26 DIAGNOSIS — F32A Depression, unspecified: Secondary | ICD-10-CM | POA: Diagnosis not present

## 2020-03-26 DIAGNOSIS — D509 Iron deficiency anemia, unspecified: Secondary | ICD-10-CM | POA: Diagnosis not present

## 2020-03-26 DIAGNOSIS — J984 Other disorders of lung: Secondary | ICD-10-CM | POA: Diagnosis not present

## 2020-03-26 DIAGNOSIS — F411 Generalized anxiety disorder: Secondary | ICD-10-CM | POA: Diagnosis not present

## 2020-03-26 DIAGNOSIS — R7303 Prediabetes: Secondary | ICD-10-CM | POA: Diagnosis not present

## 2020-03-26 DIAGNOSIS — R251 Tremor, unspecified: Secondary | ICD-10-CM | POA: Diagnosis not present

## 2020-03-26 DIAGNOSIS — E569 Vitamin deficiency, unspecified: Secondary | ICD-10-CM | POA: Diagnosis not present

## 2020-03-26 DIAGNOSIS — G7000 Myasthenia gravis without (acute) exacerbation: Secondary | ICD-10-CM | POA: Diagnosis not present

## 2020-03-26 DIAGNOSIS — Z01818 Encounter for other preprocedural examination: Secondary | ICD-10-CM | POA: Diagnosis not present

## 2020-03-26 DIAGNOSIS — Z9989 Dependence on other enabling machines and devices: Secondary | ICD-10-CM | POA: Diagnosis not present

## 2020-03-30 ENCOUNTER — Ambulatory Visit: Payer: Medicare Other

## 2020-03-30 NOTE — Chronic Care Management (AMB) (Signed)
Care Management    RN Visit Note  03/30/2020 Name: Frances Medina MRN: [id number] DOB: [birth year]  Subjective: Frances Medina is a 60 y.o. year old female who is a primary care patient of Frances Feil, MD. The care management team was consulted for assistance with disease management and care coordination needs.    Engaged with patient by telephone for follow up visit in response to provider referral for case management and/or care coordination services.   Consent to Services:   Frances Medina was given information about Care Management services today including:  1. Care Management services includes personalized support from designated clinical staff supervised by her physician, including individualized plan of care and coordination with other care providers 2. 24/7 contact phone numbers for assistance for urgent and routine care needs. 3. The patient may stop case management services at any time by phone call to the office staff.  Patient agreed to services and consent obtained.    Assessment: Patient is making progress with with her weight loss goals and preparing herself for weight loss procedure. . See Care Plan below for interventions and patient self-care actives. Follow up Plan: Patient would like continued follow-up.  CCM RNCM will outreach the patient within the next 21 days. Patient will call office if needed prior to next encounter Review of patient past medical history, allergies, medications, health status, including review of consultants reports, laboratory and other test data, was performed as part of comprehensive evaluation and provision of chronic care management services.   SDOH (Social Determinants of Health) assessments and interventions performed:    Care Plan  Allergies  Allergen Reactions  . Ciprofloxacin Shortness Of Breath and Rash  . Naproxen Shortness Of Breath  . Shrimp [Shellfish Allergy] Hives and Shortness Of Breath    ER required  . Dicyclomine  Hives  . Methocarbamol Hives  . Penicillins Hives    Has patient had a PCN reaction causing immediate rash, facial/tongue/throat swelling, SOB or lightheadedness with hypotension: Yes Has patient had a PCN reaction causing severe rash involving mucus membranes or skin necrosis: No Has patient had a PCN reaction that required hospitalization No Has patient had a PCN reaction occurring within the last 10 years: No If all of the above answers are "NO", then may proceed with Cephalosporin use.   [name] Pineapple Itching    Itchy lips and tongue  . Morphine And Related Other (See Comments)    Severe headache    Outpatient Encounter Medications as of 03/30/2020  Medication Sig Note  . albuterol (PROVENTIL) (2.5 MG/3ML) 0.083% nebulizer solution USE 1 VIAL IN NEBULIZER EVERY 6 HOURS AS NEEDED FOR SHORTNESS OF BREATH AND WHEEZING   . albuterol (VENTOLIN HFA) 108 (90 Base) MCG/ACT inhaler INHALE 2 PUFFS INTO THE LUNGS EVERY 6 HOURS AS NEEDED FOR WHEEZE OR SHORTNESS OF BREATH   . Ascorbic Acid (VITAMIN C PO) Take 1 tablet by mouth daily.   . benzonatate (TESSALON) 100 MG capsule TAKE 1 CAPSULE BY MOUTH TWICE A DAY AS NEEDED FOR COUGH   . cetirizine (ZYRTEC) 10 MG tablet Take 1 tablet (10 mg total) by mouth daily. No Therapeutic Substitution   . clonazePAM (KLONOPIN) 0.5 MG tablet TAKE 1 TABLET BY MOUTH TWICE A DAY AS NEEDED FOR ANXIETY   . Cyanocobalamin (VITAMIN B12 PO) Take 1 tablet by mouth daily.   [name] docusate sodium (COLACE) 100 MG capsule Take 1 capsule (100 mg total) by mouth 2 (two) times daily as needed for mild  constipation or moderate constipation.   . ferrous sulfate 325 (65 FE) MG tablet Take 1 tablet (325 mg total) by mouth daily.   [name] guaiFENesin (ROBITUSSIN) 100 MG/5ML SOLN Take 15 mLs by mouth daily as needed for cough or to loosen phlegm.   . Menthol 10 MG LOZG Take 1 lozenge by mouth daily as needed (cough).   . montelukast (SINGULAIR) 10 MG tablet Take 1 tablet (10 mg total) by mouth at  bedtime.   . mycophenolate (CELLCEPT) 500 MG tablet Take 1,000 mg by mouth 2 (two) times daily.   [name] omeprazole (PRILOSEC) 20 MG capsule Take 1 capsule (20 mg total) by mouth daily.   . ondansetron (ZOFRAN-ODT) 4 MG disintegrating tablet Take 4 mg by mouth every 8 (eight) hours as needed for nausea or vomiting. 03/30/2020: Patient was given RX by [facility]  . VITAMIN D, CHOLECALCIFEROL, PO Take 1 tablet by mouth daily.   . fluticasone (FLONASE) 50 MCG/ACT nasal spray Place 2 sprays into both nostrils daily. (Patient not taking: Reported on 03/30/2020)   . sertraline (ZOLOFT) 50 MG tablet TAKE 3 TABLETS BY MOUTH EVERY DAY (Patient taking differently: Take 50 mg by mouth 3 (three) times daily. ) 03/30/2020: Patient is taking as needed.   No facility-administered encounter medications on file as of 03/30/2020.    Patient Active Problem List   Diagnosis Date Noted  . Abdominal pain 08/26/2019  . Hip pain, bilateral 08/26/2019  . Positive ANA (antinuclear antibody) 08/26/2019  . Tremor 07/19/2019  . History of colonic polyps   . Epigastric pain   . Early satiety   . Macromastia 06/17/2019  . Seasonal allergies 02/18/2019  . GAD (generalized anxiety disorder) 08/06/2018  . Iron deficiency anemia 07/31/2017  . Pre-diabetes 07/31/2017  . Osteoarthritis of left hip 02/18/2017  . OSA on CPAP 09/11/2016  . Restrictive lung disease 10/11/2015  . Healthcare maintenance 09/14/2015  . Major depression   . Headache, migraine   . Shortness of breath   . Splenomegaly 04/07/2015  . Myasthenia gravis ([place])   . Osteoarthritis of left knee 12/02/2013  . Benign neoplasm of descending colon 09/19/2013  . Osteoarthritis of right knee 08/30/2013  . GERD (gastroesophageal reflux disease) 06/23/2013  . Morbid obesity ([place]) 06/23/2013  . Fibroids 06/23/2013    Conditions to be addressed/monitored: Obesity/RLD  Care Plan : RN Case Manager  Updates made by [name], RN since 03/30/2020 12:00 AM  Problem:  Weight Management (Obesity)   Long-Range Goal: Weight Loss   Start Date: 03/22/2019  Expected End Date: 06/29/2020  This Visit's Progress: On track  Priority: Medium  Current Barriers:  [name] Knowledge Deficits related to weight loss strategies as evidenced by weight gain reported weight 315 lbs. . Chronic Disease Management support and education needs related to weight management in patient with OSA/Restrictive Lung Diease Nurse Case Manager Clinical Goal(s):  [name] Over the next 90 days, patient will verbalize basic understanding of plan for initiation of weight loss strategies . Over the next 90 days, patient will demonstrate improved health management independence as evidenced by keeping daily food log/diary and increasing daily water intake (patient does not have provider prescribed fluid restriction) . Over the next 30 days, patient will work with RN Case Manager to address needs related to any barriers that she may have related to weight loss  Interventions:  Evaluation of current treatment plan related to weight loss and patient's adherence to plan as established by provider . Provided verbal and/or written education to  patient re: recommended life style changes: avoid fad diets, make small/incremental dietary and light exercise changes( IE.., walk around her kitchen table, sit in a chair and exercise her arms with  small water bottles for a few minutes a day.), eat at the table and avoid eating in front of the TV, plan management of cravings, monitor snacking and cravings in food diary . Discussed plans with patient for ongoing care management follow up and provided patient with direct contact information for care management team . Reduces sodium in her diet, discussed the effects of sodium on the body. . Weigh monthly . Be realistic on weight to lose.  Patient states 1-2 lbs to start. [name] activation or motivation to change monitored . activity and exercise based on tolerance  encouraged . barriers to physical activity or exercise addressed . encouragement and support provided . participation in weight loss program or support group encouraged . success praised . Spoke with the patient today and she stated that she is doing well,  She continues to follow her diet and exercise.  Patient reports that her weight loss surgery has been scheduled for 04/10/20. She will stay overnight. . Taking her medications as prescribed staying focused on her goal.  She was given a new prescription for Zofran 4 mg to take after her procedure. . Patient states that she still has not received her CPAP. RNCM sent a community message to [employer] concerning the CPAP for the patient. . Patient reports that she has a  sinus infection.  No fever some cough and congestion.  She is taking medication to help with her sinuses.  Advised her that if it continues and it starts to become worse she needs to make an appointment to be seen.  Patient Goals/Self Care Activities:  . Performs ADL's independently . Calls provider office for new concerns or questions . get rid of junk food . identify pros and cons of weight loss . identify what might get in the way of success . list ways to deal with barriers to success . stock up on healthy food choices . track feelings, emotional and physical, when eating . track current amount and type of exercise . track what and where I am eating        [place], BSN, [facility] Care Management Coordinator [place] Phone: [phone] I Fax: [phone]

## 2020-03-30 NOTE — Patient Instructions (Signed)
Visit Information  Goals Addressed            This Visit's Progress   . Develop a Weight Loss Readiness Plan       Timeframe:  Long-Range Goal Priority:  Medium Start Date:      02/13/20                       Expected End Date:    06/29/20           Follow Up Date 04/13/20   Patient Goals/Self Care Activities:  . Performs ADL's independently . Calls provider office for new concerns or questions . get rid of junk food . identify pros and cons of weight loss . identify what might get in the way of success . list ways to deal with barriers to success . stock up on healthy food choices . track feelings, emotional and physical, when eating . track current amount and type of exercise . track what and where I am eating     Why is this important?    Losing weight requires time to prepare your mind and your home.   Identify your eating habits and the emotions attached to eating.   Think about ways to be successful.   When you are not ready, you could lose motivation and give up on your plan.     Notes:        The patient verbalized understanding of instructions, educational materials, and care plan provided today and declined offer to receive copy of patient instructions, educational materials, and care plan.   Follow up Plan: Patient would like continued follow-up.  CCM RNCM  will outreach the patient within the next 21 days.. Patient will call office if needed prior to next encounter  [name] RN, BSN, [facility] Care Management Coordinator [place] Phone: [phone] I Fax: [phone]

## 2020-03-31 ENCOUNTER — Other Ambulatory Visit: Payer: Self-pay | Admitting: Family Medicine

## 2020-03-31 DIAGNOSIS — R059 Cough, unspecified: Secondary | ICD-10-CM

## 2020-04-01 ENCOUNTER — Encounter: Payer: Self-pay | Admitting: Family Medicine

## 2020-04-02 ENCOUNTER — Ambulatory Visit (INDEPENDENT_AMBULATORY_CARE_PROVIDER_SITE_OTHER): Payer: BC Managed Care – PPO | Admitting: Family Medicine

## 2020-04-02 DIAGNOSIS — J309 Allergic rhinitis, unspecified: Secondary | ICD-10-CM | POA: Diagnosis not present

## 2020-04-02 DIAGNOSIS — R059 Cough, unspecified: Secondary | ICD-10-CM

## 2020-04-02 MED ORDER — DEBROX 6.5 % OT SOLN: 5.0000 [drp] | Freq: Two times a day (BID) | OTIC | 0 refills | Status: DC

## 2020-04-02 MED ORDER — FLUTICASONE PROPIONATE 50 MCG/ACT NA SUSP: 2.0000 | Freq: Every day | NASAL | 6 refills | Status: DC

## 2020-04-02 NOTE — Patient Instructions (Signed)
Thank you for coming to see me today. It was a pleasure.   Your sinusitis is likely due to allergies.   Please uses Flonase nasal spray to each nostril twice a day two weeks then decrease to once a day.  Take your Cetirazine 10 mg daily.   Take Tessalon Pearls 100 mg twice a day as needed  Use Debrox in your Left ear for 1 week.  If your symptoms worsen or are no better or if you develop a fever by Friday please let me know and I will order some antibiotics.  Please let me know if your shortness of breath worsens I will add another inhaler to help.   Please follow-up with PCP as needed  If you have any questions or concerns, please do not hesitate to call the office at [phone].  Best,   Carollee Leitz, MD   Sinusitis, Adult Sinusitis is soreness and swelling (inflammation) of your sinuses. Sinuses are hollow spaces in the bones around your face. They are located:  Around your eyes.  In the middle of your forehead.  Behind your nose.  In your cheekbones. Your sinuses and nasal passages are lined with a fluid called mucus. Mucus drains out of your sinuses. Swelling can trap mucus in your sinuses. This lets germs (bacteria, virus, or fungus) grow, which leads to infection. Most of the time, this condition is caused by a virus. What are the causes? This condition is caused by:  Allergies.  Asthma.  Germs.  Things that block your nose or sinuses.  Growths in the nose (nasal polyps).  Chemicals or irritants in the air.  Fungus (rare). What increases the risk? You are more likely to develop this condition if:  You have a weak body defense system (immune system).  You do a lot of swimming or diving.  You use nasal sprays too much.  You smoke. What are the signs or symptoms? The main symptoms of this condition are pain and a feeling of pressure around the sinuses. Other symptoms include:  Stuffy nose (congestion).  Runny nose (drainage).  Swelling and  warmth in the sinuses.  Headache.  Toothache.  A cough that may get worse at night.  Mucus that collects in the throat or the back of the nose (postnasal drip).  Being unable to smell and taste.  Being very tired (fatigue).  A fever.  Sore throat.  Bad breath. How is this diagnosed? This condition is diagnosed based on:  Your symptoms.  Your medical history.  A physical exam.  Tests to find out if your condition is short-term (acute) or long-term (chronic). Your doctor may: ? Check your nose for growths (polyps). ? Check your sinuses using a tool that has a light (endoscope). ? Check for allergies or germs. ? Do imaging tests, such as an MRI or CT scan. How is this treated? Treatment for this condition depends on the cause and whether it is short-term or long-term.  If caused by a virus, your symptoms should go away on their own within 10 days. You may be given medicines to relieve symptoms. They include: ? Medicines that shrink swollen tissue in the nose. ? Medicines that treat allergies (antihistamines). ? A spray that treats swelling of the nostrils. ? Rinses that help get rid of thick mucus in your nose (nasal saline washes).  If caused by bacteria, your doctor may wait to see if you will get better without treatment. You may be given antibiotic medicine if you  have: ? A very bad infection. ? A weak body defense system.  If caused by growths in the nose, you may need to have surgery. Follow these instructions at home: Medicines  Take, use, or apply over-the-counter and prescription medicines only as told by your doctor. These may include nasal sprays.  If you were prescribed an antibiotic medicine, take it as told by your doctor. Do not stop taking the antibiotic even if you start to feel better. Hydrate and humidify  Drink enough water to keep your pee (urine) pale yellow.  Use a cool mist humidifier to keep the humidity level in your home above  50%.  Breathe in steam for 10-15 minutes, 3-4 times a day, or as told by your doctor. You can do this in the bathroom while a hot shower is running.  Try not to spend time in cool or dry air.   Rest  Rest as much as you can.  Sleep with your head raised (elevated).  Make sure you get enough sleep each night. General instructions  Put a warm, moist washcloth on your face 3-4 times a day, or as often as told by your doctor. This will help with discomfort.  Wash your hands often with soap and water. If there is no soap and water, use hand sanitizer.  Do not smoke. Avoid being around people who are smoking (secondhand smoke).  Keep all follow-up visits as told by your doctor. This is important.   Contact a doctor if:  You have a fever.  Your symptoms get worse.  Your symptoms do not get better within 10 days. Get help right away if:  You have a very bad headache.  You cannot stop throwing up (vomiting).  You have very bad pain or swelling around your face or eyes.  You have trouble seeing.  You feel confused.  Your neck is stiff.  You have trouble breathing. Summary  Sinusitis is swelling of your sinuses. Sinuses are hollow spaces in the bones around your face.  This condition is caused by tissues in your nose that become inflamed or swollen. This traps germs. These can lead to infection.  If you were prescribed an antibiotic medicine, take it as told by your doctor. Do not stop taking it even if you start to feel better.  Keep all follow-up visits as told by your doctor. This is important. This information is not intended to replace advice given to you by your health care provider. Make sure you discuss any questions you have with your health care provider. Document Revised: 07/20/2017 Document Reviewed: 07/20/2017 Elsevier Patient Education  2021 [employer].

## 2020-04-02 NOTE — Progress Notes (Signed)
    SUBJECTIVE:   CHIEF COMPLAINT / HPI: sinus issues for 1 month  Primary symptom:sinus pressure Duration:1 month Severity:worsening symptoms Associated symptoms:frontal headache, clear nasal discharge, chronic productive cough with clear phlegm, bilateral ear drainage, worsening shortness of breath Fever? Tmax?: none Sick contacts: none Covid test: none Covid vaccination(s): x2  Reports that symptoms are similar to previous sinus infections. Used OTC Flovent with improvement of symptoms.  She reports an increase in use of albuterol for the past few days, 2-3 times a day that has helped with her asthma. She has not had to used this prior to clinic  PERTINENT  PMH / PSH:  Asthma Class 3 obesity MG  OBJECTIVE:   BP 118/68   Pulse (!) 113   SpO2 96%    General: Alert, no acute distress HEENT: TM's not completely visible secondary to increased cerumen, no drainage, erythema or edema noted. Frontal and maxillary sinus tenderness,nasal turbinates swollen with mild erythema, nolymphadenopathy Cardio: Normal S1 and S2, RRR, no r/m/g Pulm: CTAB, normal work of breathing Abdomen: Bowel sounds normal. Abdomen soft and non-tender.  Extremities: No peripheral edema.     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   Continue Flovent nasal spray BID Continue home Cetirizine  Continue home Tessalon pearls 100 mg as needed If symptoms no better, develops fevers or worsen by Fri patient will reach out to and will reassess need for antibiotics Strict return precaurtions provided      Carollee Leitz, MD [place]

## 2020-04-05 ENCOUNTER — Encounter: Payer: Self-pay | Admitting: Family Medicine

## 2020-04-05 DIAGNOSIS — J309 Allergic rhinitis, unspecified: Secondary | ICD-10-CM | POA: Insufficient documentation

## 2020-04-05 NOTE — Assessment & Plan Note (Signed)
Likely secondary to allergies.   Continue Flovent nasal spray BID Continue home Cetirizine  Continue home Tessalon pearls 100 mg as needed If symptoms no better, develops fevers or worsen by Fri patient will reach out to and will reassess need for antibiotics Strict return precaurtions provided

## 2020-04-07 DIAGNOSIS — Z01818 Encounter for other preprocedural examination: Secondary | ICD-10-CM | POA: Diagnosis not present

## 2020-04-07 DIAGNOSIS — Z20822 Contact with and (suspected) exposure to covid-19: Secondary | ICD-10-CM | POA: Diagnosis not present

## 2020-04-10 DIAGNOSIS — Z539 Procedure and treatment not carried out, unspecified reason: Secondary | ICD-10-CM | POA: Diagnosis not present

## 2020-04-10 DIAGNOSIS — F419 Anxiety disorder, unspecified: Secondary | ICD-10-CM | POA: Diagnosis present

## 2020-04-10 DIAGNOSIS — R7309 Other abnormal glucose: Secondary | ICD-10-CM | POA: Diagnosis not present

## 2020-04-10 DIAGNOSIS — I10 Essential (primary) hypertension: Secondary | ICD-10-CM | POA: Diagnosis present

## 2020-04-10 DIAGNOSIS — G7000 Myasthenia gravis without (acute) exacerbation: Secondary | ICD-10-CM | POA: Diagnosis present

## 2020-04-10 DIAGNOSIS — Z87891 Personal history of nicotine dependence: Secondary | ICD-10-CM | POA: Diagnosis not present

## 2020-04-10 DIAGNOSIS — Z931 Gastrostomy status: Secondary | ICD-10-CM | POA: Diagnosis not present

## 2020-04-10 DIAGNOSIS — F32A Depression, unspecified: Secondary | ICD-10-CM | POA: Diagnosis present

## 2020-04-10 DIAGNOSIS — Z6841 Body Mass Index (BMI) 40.0 and over, adult: Secondary | ICD-10-CM | POA: Diagnosis not present

## 2020-04-10 DIAGNOSIS — G4733 Obstructive sleep apnea (adult) (pediatric): Secondary | ICD-10-CM | POA: Diagnosis present

## 2020-04-10 DIAGNOSIS — Z880 Allergy status to penicillin: Secondary | ICD-10-CM | POA: Diagnosis not present

## 2020-04-10 DIAGNOSIS — Z881 Allergy status to other antibiotic agents status: Secondary | ICD-10-CM | POA: Diagnosis not present

## 2020-04-10 DIAGNOSIS — G4730 Sleep apnea, unspecified: Secondary | ICD-10-CM | POA: Diagnosis not present

## 2020-04-10 DIAGNOSIS — K219 Gastro-esophageal reflux disease without esophagitis: Secondary | ICD-10-CM | POA: Diagnosis present

## 2020-04-11 ENCOUNTER — Telehealth: Payer: Self-pay

## 2020-04-11 NOTE — Telephone Encounter (Signed)
Transition Care Management Unsuccessful Follow-up Telephone Call  Date of discharge and from where:  04/10/20 from [facility]  Attempts:  1st Attempt  Reason for unsuccessful TCM follow-up call:  Unable to leave message

## 2020-04-12 NOTE — Telephone Encounter (Signed)
Transition Care Management Unsuccessful Follow-up Telephone Call  Date of discharge and from where:  04/10/20 from [facility]  Attempts:  2nd Attempt  Reason for unsuccessful TCM follow-up call:  Unable to leave message

## 2020-04-12 NOTE — Telephone Encounter (Signed)
Transition Care Management Follow-up Telephone Call  Date of discharge and from where: 04/10/20 from   How have you been since you were released from the hospital? Pt states that she is feeling okay, having some gas pain (which she expected). Pt stated, that the surgeon was concerned enough about it to cancel the procedure.   Any questions or concerns? No  Items Reviewed:  Did the pt receive and understand the discharge instructions provided? Yes   Medications obtained and verified? Yes   Other? No   Any new allergies since your discharge? No   Dietary orders reviewed? N/A  Do you have support at home? Yes   Functional Questionnaire: (I = Independent and D = Dependent) ADLs: I  Bathing/Dressing- I  Meal Prep- I  Eating- I  Maintaining continence- I  Transferring/Ambulation- I  Managing Meds- I   Follow up appointments reviewed:   PCP Hospital f/u appt confirmed? Yes  Scheduled to see Andrena Mews, MD on 04/17/20 @ 8:50am.  [facility] f/u appt confirmed? No    Are transportation arrangements needed? No  If their condition worsens, is the pt aware to call PCP or go to the Emergency Dept.? Yes Was the patient provided with contact information for the PCP's office or ED? Yes Was to pt encouraged to call back with questions or concerns? Yes

## 2020-04-13 ENCOUNTER — Ambulatory Visit: Payer: Medicare Other

## 2020-04-14 NOTE — Chronic Care Management (AMB) (Signed)
Care Management    RN Visit Note  04/14/2020 Name: Frances Medina MRN: [id number] DOB: 01/25/1961  Subjective: Frances Medina is a 60 y.o. year old female who is a primary care patient of [name], MD. The care management team was consulted for assistance with disease management and care coordination needs.    Engaged with patient by telephone for follow up visit in response to provider referral for case management and/or care coordination services.   Consent to Services:   [name] was given information about Care Management services today including:  1. Care Management services includes personalized support from designated clinical staff supervised by her physician, including individualized plan of care and coordination with other care providers 2. 24/7 contact phone numbers for assistance for urgent and routine care needs. 3. The patient may stop case management services at any time by phone call to the office staff.  Patient agreed to services and consent obtained.    Assessment: Patient is currently experiencing symptoms of  pain  which seems to be exacerbated by the surgery the she started to have... See Care Plan below for interventions and patient self-care actives. Follow up Plan: Patient would like continued follow-up.  CCM RNCM will outreach the patient within the next 21 days.. Patient will call office if needed prior to next encounter  Review of patient past medical history, allergies, medications, health status, including review of consultants reports, laboratory and other test data, was performed as part of comprehensive evaluation and provision of chronic care management services.   SDOH (Social Determinants of Health) assessments and interventions performed:    Care Plan  Allergies  Allergen Reactions  . Ciprofloxacin Shortness Of Breath and Rash  . Naproxen Shortness Of Breath  . Shrimp [Shellfish Allergy] Hives and Shortness Of Breath    ER required  .  Dicyclomine Hives  . Methocarbamol Hives  . Penicillins Hives    Has patient had a PCN reaction causing immediate rash, facial/tongue/throat swelling, SOB or lightheadedness with hypotension: Yes Has patient had a PCN reaction causing severe rash involving mucus membranes or skin necrosis: No Has patient had a PCN reaction that required hospitalization No Has patient had a PCN reaction occurring within the last 10 years: No If all of the above answers are "NO", then may proceed with Cephalosporin use.   [name] Pineapple Itching    Itchy lips and tongue  . Morphine And Related Other (See Comments)    Severe headache    Outpatient Encounter Medications as of 04/13/2020  Medication Sig Note  . albuterol (PROVENTIL) (2.5 MG/3ML) 0.083% nebulizer solution USE 1 VIAL IN NEBULIZER EVERY 6 HOURS AS NEEDED FOR SHORTNESS OF BREATH AND WHEEZING   . albuterol (VENTOLIN HFA) 108 (90 Base) MCG/ACT inhaler INHALE 2 PUFFS INTO THE LUNGS EVERY 6 HOURS AS NEEDED FOR WHEEZE OR SHORTNESS OF BREATH   . Ascorbic Acid (VITAMIN C PO) Take 1 tablet by mouth daily.   . benzonatate (TESSALON) 100 MG capsule TAKE 1 CAPSULE BY MOUTH TWICE A DAY AS NEEDED FOR COUGH   . carbamide peroxide (DEBROX) 6.5 % OTIC solution Place 5 drops into the left ear 2 (two) times daily.   . cetirizine (ZYRTEC) 10 MG tablet Take 1 tablet (10 mg total) by mouth daily. No Therapeutic Substitution   . clonazePAM (KLONOPIN) 0.5 MG tablet TAKE 1 TABLET BY MOUTH TWICE A DAY AS NEEDED FOR ANXIETY   . Cyanocobalamin (VITAMIN B12 PO) Take 1 tablet by mouth  daily.   . docusate sodium (COLACE) 100 MG capsule Take 1 capsule (100 mg total) by mouth 2 (two) times daily as needed for mild constipation or moderate constipation.   . ferrous sulfate 325 (65 FE) MG tablet Take 1 tablet (325 mg total) by mouth daily.   . fluticasone (FLONASE) 50 MCG/ACT nasal spray Place 2 sprays into both nostrils daily.   [name] guaiFENesin (ROBITUSSIN) 100 MG/5ML SOLN Take 15 mLs  by mouth daily as needed for cough or to loosen phlegm.   . Menthol 10 MG LOZG Take 1 lozenge by mouth daily as needed (cough).   . montelukast (SINGULAIR) 10 MG tablet Take 1 tablet (10 mg total) by mouth at bedtime.   . mycophenolate (CELLCEPT) 500 MG tablet Take 1,000 mg by mouth 2 (two) times daily.   [name] omeprazole (PRILOSEC) 20 MG capsule Take 1 capsule (20 mg total) by mouth daily.   . ondansetron (ZOFRAN-ODT) 4 MG disintegrating tablet Take 4 mg by mouth every 8 (eight) hours as needed for nausea or vomiting. 03/30/2020: Patient was given RX by [facility]  . sertraline (ZOLOFT) 50 MG tablet TAKE 3 TABLETS BY MOUTH EVERY DAY (Patient taking differently: Take 50 mg by mouth 3 (three) times daily. ) 03/30/2020: Patient is taking as needed.  [name] VITAMIN D, CHOLECALCIFEROL, PO Take 1 tablet by mouth daily.    No facility-administered encounter medications on file as of 04/13/2020.    Patient Active Problem List   Diagnosis Date Noted  . Allergic rhinosinusitis 04/05/2020  . Abdominal pain 08/26/2019  . Hip pain, bilateral 08/26/2019  . Positive ANA (antinuclear antibody) 08/26/2019  . Tremor 07/19/2019  . History of colonic polyps   . Epigastric pain   . Early satiety   . Macromastia 06/17/2019  . Seasonal allergies 02/18/2019  . GAD (generalized anxiety disorder) 08/06/2018  . Iron deficiency anemia 07/31/2017  . Pre-diabetes 07/31/2017  . Osteoarthritis of left hip 02/18/2017  . OSA on CPAP 09/11/2016  . Restrictive lung disease 10/11/2015  . Healthcare maintenance 09/14/2015  . Major depression   . Headache, migraine   . Shortness of breath   . Splenomegaly 04/07/2015  . Myasthenia gravis ([place])   . Osteoarthritis of left knee 12/02/2013  . Benign neoplasm of descending colon 09/19/2013  . Osteoarthritis of right knee 08/30/2013  . GERD (gastroesophageal reflux disease) 06/23/2013  . Morbid obesity ([place]) 06/23/2013  . Fibroids 06/23/2013    Conditions to be addressed/monitored:  Obesity  Care Plan : RN Case Manager  Updates made by [name], RN since 04/14/2020 12:00 AM  Problem: Weight Management (Obesity)   Long-Range Goal: Weight Loss   Start Date: 03/22/2019  Expected End Date: 07/31/2020  Recent Progress: On track  Priority: Medium  Note:   Current Barriers:  [name] Knowledge Deficits related to weight loss strategies as evidenced by weight gain reported weight 315 lbs. . Chronic Disease Management support and education needs related to weight management in patient with OSA/Restrictive Lung Diease Nurse Case Manager Clinical Goal(s):  [name] Over the next 90 days, patient will verbalize basic understanding of plan for initiation of weight loss strategies . Over the next 90 days, patient will demonstrate improved health management independence as evidenced by keeping daily food log/diary and increasing daily water intake (patient does not have provider prescribed fluid restriction) . Over the next 30 days, patient will work with RN Case Manager to address needs related to any barriers that she may have related to weight loss  Interventions:  Evaluation of current treatment plan related to weight loss and patient's adherence to plan as established by provider . Provided verbal and/or written education to patient re: recommended life style changes: avoid fad diets, make small/incremental dietary and light exercise changes( IE.., walk around her kitchen table, sit in a chair and exercise her arms with  small water bottles for a few minutes a day.), eat at the table and avoid eating in front of the TV, plan management of cravings, monitor snacking and cravings in food diary . Discussed plans with patient for ongoing care management follow up and provided patient with direct contact information for care management team . Reduces sodium in her diet, discussed the effects of sodium on the body. . Weigh monthly . Be realistic on weight to lose.  Patient states 1-2 lbs to  start. [name] activation or motivation to change monitored . activity and exercise based on tolerance encouraged . barriers to physical activity or exercise addressed . encouragement and support provided . participation in weight loss program or support group encouraged . success praised .  Taking her medications as prescribed  . Spoke with the patient today and she stated that she was unable to have her surgery.  During the surgery they saw that her liver was enlarged and the physician felt that it could have caused a problem the surgery was discontinued. She is having some some pain but taking it easy and resting.  She is not eating any big meals just broth for now and staying hydrated.  Patient Goals/Self Care Activities:  . Performs ADL's independently . Calls provider office for new concerns or questions        [name] RN, BSN, [facility] Care Management Coordinator [place] Phone: [phone] I Fax: [phone]

## 2020-04-14 NOTE — Patient Instructions (Signed)
Visit Information  Frances Medina  it was nice speaking with you. Please call me directly [phone] if you have questions about the goals we discussed.  Goals Addressed            This Visit's Progress   . Develop a Weight Loss Readiness Plan       Timeframe:  Long-Range Goal Priority:  Medium Start Date:      02/13/20                       Expected End Date:    07/31/20      Follow Up Date 04/27/20   Patient Goals/Self Care Activities:  . Performs ADL's independently . Calls provider office for new concerns or questions . Stay hydrated and eat small meals       Why is this important?    Losing weight requires time to prepare your mind and your home.   Identify your eating habits and the emotions attached to eating.   Think about ways to be successful.   When you are not ready, you could lose motivation and give up on your plan.     Notes:        The patient verbalized understanding of instructions, educational materials, and care plan provided today and declined offer to receive copy of patient instructions, educational materials, and care plan.   Follow up Plan: Patient would like continued follow-up.  CCM RNCM will outreach the patient within the next 21 days .[name] Patient will call office if needed prior to next encounter  [name], RN

## 2020-04-17 ENCOUNTER — Telehealth (INDEPENDENT_AMBULATORY_CARE_PROVIDER_SITE_OTHER): Payer: BC Managed Care – PPO | Admitting: Family Medicine

## 2020-04-17 ENCOUNTER — Encounter: Payer: Self-pay | Admitting: Family Medicine

## 2020-04-17 ENCOUNTER — Other Ambulatory Visit: Payer: Self-pay

## 2020-04-17 DIAGNOSIS — G7000 Myasthenia gravis without (acute) exacerbation: Secondary | ICD-10-CM | POA: Diagnosis not present

## 2020-04-17 DIAGNOSIS — F329 Major depressive disorder, single episode, unspecified: Secondary | ICD-10-CM | POA: Diagnosis not present

## 2020-04-17 DIAGNOSIS — Z1231 Encounter for screening mammogram for malignant neoplasm of breast: Secondary | ICD-10-CM

## 2020-04-17 NOTE — Assessment & Plan Note (Signed)
Stable. I discussed need for Shngrix with her. However, there has been report of MG flare following Shingrix vaccination. I advised her to discuss vaccination with her neurologist at her next appointment. She will let me know if she can proceed with vaccination.

## 2020-04-17 NOTE — Assessment & Plan Note (Signed)
Mildly aggravated by a recent failed surgery. Continue Trazodone at current dose. Counseling discussed, but she feels she can do well without referral. She is otherwise not a danger to self or others.

## 2020-04-17 NOTE — Progress Notes (Signed)
Patient ID: Frances Medina, female   DOB: [birth year], 60 y.o.   MRN: [id number] [place] Telemedicine Visit  Patient consented to have virtual visit and was identified by name and date of birth. Method of visit: Video  Encounter participants: Patient: NEVAH DALAL - located at Home Provider: Andrena Mews - located at [address] [facility] office Others (if applicable): N/A  Chief Complaint: Weight loss process, large liver  HPI:  Weight management:  The patient could not complete her weight loss surgery process due to a large Liver which obscured the surgeon's view. She still feels some discomfort in her stomach post-op. She is on a chicken, beef broth meal as well as jello and Ginger ale. No other concerns.  MDD: She feels sad and alone since she had a failed weight loss surgery. She denies self-harm.  DT:OIZT mammogram and Shingrix  ROS: per HPI  Pertinent PMHx: PMX reviewed  Exam:   There were no vitals taken for this visit.   Physical Exam Pulmonary:     Effort: No respiratory distress.  Neurological:     Mental Status: She is alert.  Psychiatric:        Mood and Affect: Mood normal.        Behavior: Behavior normal.     Flowsheet Row Video Visit from 04/17/2020 in [place]  PHQ-9 Total Score 10      Assessment/Plan:  Morbid obesity ([place]) I reviewed and discussed her surgery report with her. The surgeon recommended a gastric sleeve procedure which her husband declined. I encouraged her to schedule f/u with surgery to discuss her future options. Regarding enlarged liver, perhaps weight loss can help. She will continue to work and her diet and exercise to improve her weight in anticipation of future surgery.  Major depression Mildly aggravated by a recent failed surgery. Continue Trazodone at current dose. Counseling discussed, but she feels she can do well without referral. She is otherwise not a danger to self or  others.   Myasthenia gravis ([place]) Stable. I discussed need for Shngrix with her. However, there has been report of MG flare following Shingrix vaccination. I advised her to discuss vaccination with her neurologist at her next appointment. She will let me know if she can proceed with vaccination.   HM: Mammogram discussed and was ordered.  Time spent during visit with patient: 25 minutes

## 2020-04-17 NOTE — Patient Instructions (Signed)
Zoster Vaccine, Recombinant injection What is this medicine? ZOSTER VACCINE (ZOS ter vak SEEN) is a vaccine used to reduce the risk of getting shingles. This vaccine is not used to treat shingles or nerve pain from shingles. This medicine may be used for other purposes; ask your health care provider or pharmacist if you have questions. COMMON BRAND NAME(S): [facility] What should I tell my health care provider before I take this medicine? They need to know if you have any of these conditions:  cancer  immune system problems  an unusual or allergic reaction to Zoster vaccine, other medications, foods, dyes, or preservatives  pregnant or trying to get pregnant  breast-feeding How should I use this medicine? This vaccine is injected into a muscle. It is given by a health care provider. A copy of Vaccine Information Statements will be given before each vaccination. Be sure to read this information carefully each time. This sheet may change often. Talk to your health care provider about the use of this vaccine in children. This vaccine is not approved for use in children. Overdosage: If you think you have taken too much of this medicine contact a poison control center or emergency room at once. NOTE: This medicine is only for you. Do not share this medicine with others. What if I miss a dose? Keep appointments for follow-up (booster) doses. It is important not to miss your dose. Call your health care provider if you are unable to keep an appointment. What may interact with this medicine?  medicines that suppress your immune system  medicines to treat cancer  steroid medicines like prednisone or cortisone This list may not describe all possible interactions. Give your health care provider a list of all the medicines, herbs, non-prescription drugs, or dietary supplements you use. Also tell them if you smoke, drink alcohol, or use illegal drugs. Some items may interact with your medicine. What  should I watch for while using this medicine? Visit your health care provider regularly. This vaccine, like all vaccines, may not fully protect everyone. What side effects may I notice from receiving this medicine? Side effects that you should report to your doctor or health care professional as soon as possible:  allergic reactions (skin rash, itching or hives; swelling of the face, lips, or tongue)  trouble breathing Side effects that usually do not require medical attention (report these to your doctor or health care professional if they continue or are bothersome):  chills  headache  fever  nausea  pain, redness, or irritation at site where injected  tiredness  vomiting This list may not describe all possible side effects. Call your doctor for medical advice about side effects. You may report side effects to FDA at 1-800-FDA-1088. Where should I keep my medicine? This vaccine is only given by a health care provider. It will not be stored at home. NOTE: This sheet is a summary. It may not cover all possible information. If you have questions about this medicine, talk to your doctor, pharmacist, or health care provider.  2021 Elsevier/Gold Standard (2019-03-25 16:23:07)

## 2020-04-17 NOTE — Assessment & Plan Note (Signed)
I reviewed and discussed her surgery report with her. The surgeon recommended a gastric sleeve procedure which her husband declined. I encouraged her to schedule f/u with surgery to discuss her future options. Regarding enlarged liver, perhaps weight loss can help. She will continue to work and her diet and exercise to improve her weight in anticipation of future surgery.

## 2020-04-19 ENCOUNTER — Other Ambulatory Visit: Payer: Self-pay | Admitting: Family Medicine

## 2020-04-20 ENCOUNTER — Encounter: Payer: Self-pay | Admitting: Family Medicine

## 2020-04-23 ENCOUNTER — Ambulatory Visit: Payer: Medicare Other

## 2020-04-23 ENCOUNTER — Other Ambulatory Visit: Payer: Self-pay | Admitting: Family Medicine

## 2020-04-23 NOTE — Patient Instructions (Signed)
Visit Information  Ms. Myhre  it was nice speaking with you. Please call me directly [phone] if you have questions about the goals we discussed.  Goals Addressed            This Visit's Progress   . Develop a Weight Loss Readiness Plan       Timeframe:  Long-Range Goal Priority:  Medium Start Date:      02/13/20                       Expected End Date:    07/31/20      Follow Up Date 04/27/20   Patient Goals/Self Care Activities:  . Performs ADL's independently . Calls provider office for new concerns or questions . Move as tolerated and rest when needed       Why is this important?    Losing weight requires time to prepare your mind and your home.   Identify your eating habits and the emotions attached to eating.   Think about ways to be successful.   When you are not ready, you could lose motivation and give up on your plan.     Notes:        The patient verbalized understanding of instructions, educational materials, and care plan provided today and declined offer to receive copy of patient instructions, educational materials, and care plan.   Follow up Plan: Patient would like continued follow-up.  CCM RNCM will follow up with the patient at the next scheduled interval. Patient will call office if needed prior to next encounter  [name], RN

## 2020-04-23 NOTE — Chronic Care Management (AMB) (Signed)
Care Management    RN Visit Note  04/23/2020 Name: Frances Medina MRN: [id number] DOB: 1960/08/22  Subjective: Frances Medina is a 60 y.o. year old female who is a primary care patient of [name], MD. The care management team was consulted for assistance with disease management and care coordination needs.    Engaged with patient by telephone for follow up visit in response to provider referral for case management and/or care coordination services.   Consent to Services:   Ms. Haft was given information about Care Management services today including:  1. Care Management services includes personalized support from designated clinical staff supervised by her physician, including individualized plan of care and coordination with other care providers 2. 24/7 contact phone numbers for assistance for urgent and routine care needs. 3. The patient may stop case management services at any time by phone call to the office staff.  Patient agreed to services and consent obtained.    Assessment: Patient needed follow up informatio for her CPAP. See Care Plan below for interventions and patient self-care actives. Follow up Plan: Patient would like continued follow-up.  CCM RNCM will follow up with the patient at the next scheduled interval. Patient will call office if needed prior to next encounter  Review of patient past medical history, allergies, medications, health status, including review of consultants reports, laboratory and other test data, was performed as part of comprehensive evaluation and provision of chronic care management services.   SDOH (Social Determinants of Health) assessments and interventions performed:    Care Plan  Allergies  Allergen Reactions  . Ciprofloxacin Shortness Of Breath and Rash  . Naproxen Shortness Of Breath  . Shrimp [Shellfish Allergy] Hives and Shortness Of Breath    ER required  . Dicyclomine Hives  . Methocarbamol Hives  . Penicillins  Hives    Has patient had a PCN reaction causing immediate rash, facial/tongue/throat swelling, SOB or lightheadedness with hypotension: Yes Has patient had a PCN reaction causing severe rash involving mucus membranes or skin necrosis: No Has patient had a PCN reaction that required hospitalization No Has patient had a PCN reaction occurring within the last 10 years: No If all of the above answers are "NO", then may proceed with Cephalosporin use.   [name] Pineapple Itching    Itchy lips and tongue  . Morphine And Related Other (See Comments)    Severe headache    Outpatient Encounter Medications as of 04/23/2020  Medication Sig Note  . albuterol (PROVENTIL) (2.5 MG/3ML) 0.083% nebulizer solution USE 1 VIAL IN NEBULIZER EVERY 6 HOURS AS NEEDED FOR SHORTNESS OF BREATH AND WHEEZING (Patient not taking: Reported on 04/17/2020)   . albuterol (VENTOLIN HFA) 108 (90 Base) MCG/ACT inhaler INHALE 2 PUFFS INTO THE LUNGS EVERY 6 HOURS AS NEEDED FOR WHEEZE OR SHORTNESS OF BREATH (Patient not taking: Reported on 04/17/2020)   . Ascorbic Acid (VITAMIN C PO) Take 1 tablet by mouth daily.   . benzonatate (TESSALON) 100 MG capsule TAKE 1 CAPSULE BY MOUTH TWICE A DAY AS NEEDED FOR COUGH (Patient not taking: Reported on 04/17/2020)   . carbamide peroxide (DEBROX) 6.5 % OTIC solution Place 5 drops into the left ear 2 (two) times daily.   . cetirizine (ZYRTEC) 10 MG tablet TAKE 1 TABLET (10 MG TOTAL) BY MOUTH DAILY. NO THERAPEUTIC SUBSTITUTION   . clonazePAM (KLONOPIN) 0.5 MG tablet TAKE 1 TABLET BY MOUTH TWICE A DAY AS NEEDED FOR ANXIETY (Patient not taking: Reported on 04/17/2020)   .  Cyanocobalamin (VITAMIN B12 PO) Take 1 tablet by mouth daily.   [name] docusate sodium (COLACE) 100 MG capsule Take 1 capsule (100 mg total) by mouth 2 (two) times daily as needed for mild constipation or moderate constipation. (Patient not taking: Reported on 04/17/2020)   . ferrous sulfate 325 (65 FE) MG tablet Take 1 tablet (325 mg total) by  mouth daily.   . fluticasone (FLONASE) 50 MCG/ACT nasal spray Place 2 sprays into both nostrils daily.   [name] gabapentin (NEURONTIN) 300 MG capsule Take 300 mg by mouth 3 (three) times daily as needed.   [name] guaiFENesin (ROBITUSSIN) 100 MG/5ML SOLN Take 15 mLs by mouth daily as needed for cough or to loosen phlegm. (Patient not taking: Reported on 04/17/2020)   . Menthol 10 MG LOZG Take 1 lozenge by mouth daily as needed (cough). (Patient not taking: Reported on 04/17/2020)   . montelukast (SINGULAIR) 10 MG tablet TAKE 1 TABLET BY MOUTH EVERYDAY AT BEDTIME   . mycophenolate (CELLCEPT) 500 MG tablet Take 1,000 mg by mouth 2 (two) times daily.   [name] omeprazole (PRILOSEC) 20 MG capsule Take 1 capsule (20 mg total) by mouth daily.   . sertraline (ZOLOFT) 50 MG tablet TAKE 3 TABLETS BY MOUTH EVERY DAY (Patient taking differently: Take 50 mg by mouth 3 (three) times daily.) 03/30/2020: Patient is taking as needed.  [name] VITAMIN D, CHOLECALCIFEROL, PO Take 1 tablet by mouth daily.    No facility-administered encounter medications on file as of 04/23/2020.    Patient Active Problem List   Diagnosis Date Noted  . Allergic rhinosinusitis 04/05/2020  . Abdominal pain 08/26/2019  . Hip pain, bilateral 08/26/2019  . Positive ANA (antinuclear antibody) 08/26/2019  . Tremor 07/19/2019  . History of colonic polyps   . Epigastric pain   . Early satiety   . Macromastia 06/17/2019  . Seasonal allergies 02/18/2019  . GAD (generalized anxiety disorder) 08/06/2018  . Iron deficiency anemia 07/31/2017  . Pre-diabetes 07/31/2017  . Osteoarthritis of left hip 02/18/2017  . OSA on CPAP 09/11/2016  . Restrictive lung disease 10/11/2015  . Healthcare maintenance 09/14/2015  . Major depression   . Headache, migraine   . Shortness of breath   . Splenomegaly 04/07/2015  . Myasthenia gravis ([place])   . Osteoarthritis of left knee 12/02/2013  . Benign neoplasm of descending colon 09/19/2013  . Osteoarthritis of right knee  08/30/2013  . GERD (gastroesophageal reflux disease) 06/23/2013  . Morbid obesity ([place]) 06/23/2013  . Fibroids 06/23/2013    Conditions to be addressed/monitored: Obesity/CPAP     Patient Care Plan: RN Case Manager    Problem Identified: Weight Management (Obesity)     Long-Range Goal: Weight Loss   Start Date: 03/22/2019  Expected End Date: 07/31/2020  Recent Progress: On track  Priority: Medium  Note:   Current Barriers:  [name] Knowledge Deficits related to weight loss strategies as evidenced by weight gain reported weight 315 lbs. . Chronic Disease Management support and education needs related to weight management in patient with OSA/Restrictive Lung Diease Nurse Case Manager Clinical Goal(s):  [name] Over the next 90 days, patient will verbalize basic understanding of plan for initiation of weight loss strategies . Over the next 90 days, patient will demonstrate improved health management independence as evidenced by keeping daily food log/diary and increasing daily water intake (patient does not have provider prescribed fluid restriction) . Over the next 30 days, patient will work with RN Case Freight forwarder to address needs related to any  barriers that she may have related to weight loss  Interventions:  Evaluation of current treatment plan related to weight loss and patient's adherence to plan as established by provider . Provided verbal and/or written education to patient re: recommended life style changes: avoid fad diets, make small/incremental dietary and light exercise changes( IE.., walk around her kitchen table, sit in a chair and exercise her arms with  small water bottles for a few minutes a day.), eat at the table and avoid eating in front of the TV, plan management of cravings, monitor snacking and cravings in food diary . Discussed plans with patient for ongoing care management follow up and provided patient with direct contact information for care management team . Reduces sodium  in her diet, discussed the effects of sodium on the body. . Weigh monthly . Be realistic on weight to lose.  Patient states 1-2 lbs to start. [name] activation or motivation to change monitored . activity and exercise based on tolerance encouraged . barriers to physical activity or exercise addressed . encouragement and support provided . participation in weight loss program or support group encouraged . success praised .  Taking her medications as prescribed  . Patient called and stated that she had been contacted about her CPAP and had erased the information off her machine.  She asked if RNCM could contact [employer] and have them to reach out to her again or give her a contact number to call them.  RNCM reached out to [place] and he stated that he would ask someone one on the team to contact her but also gave a number to call if she needed   . [phone].  RNCM called the patient and gave her the information. . Patient also stated that she is feeling some better and is moving around but if she starts to feel pain she will sit and rest.  Patient Goals/Self Care Activities:  . Performs ADL's independently . Calls provider office for new concerns or questions        [name], RN

## 2020-04-27 ENCOUNTER — Ambulatory Visit: Payer: Medicare Other

## 2020-04-28 NOTE — Chronic Care Management (AMB) (Signed)
Care Management    RN Visit Note  04/28/2020 Name: Frances Medina MRN: [id number] DOB: 1960/10/02  Subjective: Frances Medina is a 60 y.o. year old female who is a primary care patient of Frances Feil, MD. The care management team was consulted for assistance with disease management and care coordination needs.    Engaged with patient by telephone for follow up visit in response to provider referral for case management and/or care coordination services.   Consent to Services:   Frances Medina was given information about Care Management services today including:  1. Care Management services includes personalized support from designated clinical staff supervised by her physician, including individualized plan of care and coordination with other care providers 2. 24/7 contact phone numbers for assistance for urgent and routine care needs. 3. The patient may stop case management services at any time by phone call to the office staff.  Patient agreed to services and consent obtained.    Assessment: Patient is making progress after her surgeryby ambulating.. See Care Plan below for interventions and patient self-care actives. Follow up Plan: Patient would like continued follow-up.  CCM RNCM will outreach the patient within 30 days.. Patient will call office if needed prior to next encounter : Review of patient past medical history, allergies, medications, health status, including review of consultants reports, laboratory and other test data, was performed as part of comprehensive evaluation and provision of chronic care management services.   SDOH (Social Determinants of Health) assessments and interventions performed:    Care Plan  Allergies  Allergen Reactions  . Ciprofloxacin Shortness Of Breath and Rash  . Naproxen Shortness Of Breath  . Shrimp [Shellfish Allergy] Hives and Shortness Of Breath    ER required  . Dicyclomine Hives  . Methocarbamol Hives  . Penicillins Hives    Has  patient had a PCN reaction causing immediate rash, facial/tongue/throat swelling, SOB or lightheadedness with hypotension: Yes Has patient had a PCN reaction causing severe rash involving mucus membranes or skin necrosis: No Has patient had a PCN reaction that required hospitalization No Has patient had a PCN reaction occurring within the last 10 years: No If all of the above answers are "NO", then may proceed with Cephalosporin use.   [name] Pineapple Itching    Itchy lips and tongue  . Morphine And Related Other (See Comments)    Severe headache    Outpatient Encounter Medications as of 04/27/2020  Medication Sig Note  . albuterol (PROVENTIL) (2.5 MG/3ML) 0.083% nebulizer solution USE 1 VIAL IN NEBULIZER EVERY 6 HOURS AS NEEDED FOR SHORTNESS OF BREATH AND WHEEZING (Patient not taking: Reported on 04/17/2020)   . albuterol (VENTOLIN HFA) 108 (90 Base) MCG/ACT inhaler INHALE 2 PUFFS INTO THE LUNGS EVERY 6 HOURS AS NEEDED FOR WHEEZE OR SHORTNESS OF BREATH (Patient not taking: Reported on 04/17/2020)   . Ascorbic Acid (VITAMIN C PO) Take 1 tablet by mouth daily.   . benzonatate (TESSALON) 100 MG capsule TAKE 1 CAPSULE BY MOUTH TWICE A DAY AS NEEDED FOR COUGH (Patient not taking: Reported on 04/17/2020)   . carbamide peroxide (DEBROX) 6.5 % OTIC solution Place 5 drops into the left ear 2 (two) times daily.   . cetirizine (ZYRTEC) 10 MG tablet TAKE 1 TABLET (10 MG TOTAL) BY MOUTH DAILY. NO THERAPEUTIC SUBSTITUTION   . clonazePAM (KLONOPIN) 0.5 MG tablet TAKE 1 TABLET BY MOUTH TWICE A DAY AS NEEDED FOR ANXIETY (Patient not taking: Reported on 04/17/2020)   . Cyanocobalamin (  VITAMIN B12 PO) Take 1 tablet by mouth daily.   [name] docusate sodium (COLACE) 100 MG capsule Take 1 capsule (100 mg total) by mouth 2 (two) times daily as needed for mild constipation or moderate constipation. (Patient not taking: Reported on 04/17/2020)   . ferrous sulfate 325 (65 FE) MG tablet Take 1 tablet (325 mg total) by mouth daily.    . fluticasone (FLONASE) 50 MCG/ACT nasal spray Place 2 sprays into both nostrils daily.   [name] gabapentin (NEURONTIN) 300 MG capsule Take 300 mg by mouth 3 (three) times daily as needed.   [name] guaiFENesin (ROBITUSSIN) 100 MG/5ML SOLN Take 15 mLs by mouth daily as needed for cough or to loosen phlegm. (Patient not taking: Reported on 04/17/2020)   . Menthol 10 MG LOZG Take 1 lozenge by mouth daily as needed (cough). (Patient not taking: Reported on 04/17/2020)   . montelukast (SINGULAIR) 10 MG tablet TAKE 1 TABLET BY MOUTH EVERYDAY AT BEDTIME   . mycophenolate (CELLCEPT) 500 MG tablet Take 1,000 mg by mouth 2 (two) times daily.   [name] omeprazole (PRILOSEC) 20 MG capsule Take 1 capsule (20 mg total) by mouth daily.   . sertraline (ZOLOFT) 50 MG tablet TAKE 3 TABLETS BY MOUTH EVERY DAY (Patient taking differently: Take 50 mg by mouth 3 (three) times daily.) 03/30/2020: Patient is taking as needed.  [name] VITAMIN D, CHOLECALCIFEROL, PO Take 1 tablet by mouth daily.    No facility-administered encounter medications on file as of 04/27/2020.    Patient Active Problem List   Diagnosis Date Noted  . Allergic rhinosinusitis 04/05/2020  . Abdominal pain 08/26/2019  . Hip pain, bilateral 08/26/2019  . Positive ANA (antinuclear antibody) 08/26/2019  . Tremor 07/19/2019  . History of colonic polyps   . Epigastric pain   . Early satiety   . Macromastia 06/17/2019  . Seasonal allergies 02/18/2019  . GAD (generalized anxiety disorder) 08/06/2018  . Iron deficiency anemia 07/31/2017  . Pre-diabetes 07/31/2017  . Osteoarthritis of left hip 02/18/2017  . OSA on CPAP 09/11/2016  . Restrictive lung disease 10/11/2015  . Healthcare maintenance 09/14/2015  . Major depression   . Headache, migraine   . Shortness of breath   . Splenomegaly 04/07/2015  . Myasthenia gravis ([place])   . Osteoarthritis of left knee 12/02/2013  . Benign neoplasm of descending colon 09/19/2013  . Osteoarthritis of right knee 08/30/2013  .  GERD (gastroesophageal reflux disease) 06/23/2013  . Morbid obesity ([place]) 06/23/2013  . Fibroids 06/23/2013    Conditions to be addressed/monitored: Obesity  Care Plan : RN Case Manager  Updates made by [name], RN since 04/28/2020 12:00 AM  Problem: Weight Management (Obesity)   Long-Range Goal: Weight Loss   Start Date: 03/22/2019  Expected End Date: 07/31/2020  Recent Progress: On track  Priority: Medium  Note:   Current Barriers:  [name] Knowledge Deficits related to weight loss strategies as evidenced by weight gain reported weight 315 lbs. . Chronic Disease Management support and education needs related to weight management in patient with OSA/Restrictive Lung Diease Nurse Case Manager Clinical Goal(s):  [name] Over the next 90 days, patient will verbalize basic understanding of plan for initiation of weight loss strategies . Over the next 90 days, patient will demonstrate improved health management independence as evidenced by keeping daily food log/diary and increasing daily water intake (patient does not have provider prescribed fluid restriction) . Over the next 30 days, patient will work with RN Case Freight forwarder to address needs related  to any barriers that she may have related to weight loss  Interventions:  Evaluation of current treatment plan related to weight loss and patient's adherence to plan as established by provider . Provided verbal and/or written education to patient re: recommended life style changes: avoid fad diets, make small/incremental dietary and light exercise changes( IE.., walk around her kitchen table, sit in a chair and exercise her arms with  small water bottles for a few minutes a day.), eat at the table and avoid eating in front of the TV, plan management of cravings, monitor snacking and cravings in food diary . Discussed plans with patient for ongoing care management follow up and provided patient with direct contact information for care management  team . Reduces sodium in her diet, discussed the effects of sodium on the body. . Weigh monthly . Be realistic on weight to lose.  Patient states 1-2 lbs to start. [name] activation or motivation to change monitored . activity and exercise based on tolerance encouraged . barriers to physical activity or exercise addressed . encouragement and support provided . participation in weight loss program or support group encouraged . success praised .  Taking her medications as prescribed  . Patient reports that she is feeling better.  She is able to ambulate and inhale and exhale with no problems.  She is not eating big meals yet still eating broths.  She has an appointment with Dr [place] next month 05/22/20 at [facility] for follow up. . Patient was able to speak with [employer] representative and her CPAP is being delayed.  They are behind because of shipping issues.   Patient Goals/Self Care Activities:  . Performs ADL's independently . Calls provider office for new concerns or questions        [name] RN, BSN, [facility] Care Management Coordinator [place] Phone: [phone] Fax: [phone]

## 2020-04-29 NOTE — Patient Instructions (Signed)
Visit Information  Ms. Hannula  it was nice speaking with you. Please call me directly [phone] if you have questions about the goals we discussed.  Goals Addressed            This Visit's Progress   . Develop a Weight Loss Readiness Plan       Timeframe:  Long-Range Goal Priority:  Medium Start Date:      02/13/20                       Expected End Date:    07/31/20      Follow Up Date 05/18/20   Patient Goals/Self Care Activities:  . Performs ADL's independently . Calls provider office for new concerns or questions . Move as tolerated and rest when needed       Why is this important?    Losing weight requires time to prepare your mind and your home.   Identify your eating habits and the emotions attached to eating.   Think about ways to be successful.   When you are not ready, you could lose motivation and give up on your plan.     Notes:        The patient verbalized understanding of instructions, educational materials, and care plan provided today and declined offer to receive copy of patient instructions, educational materials, and care plan.   Follow up Plan: Patient would like continued follow-up.  CCM RNCM will outreach the patient within the next 30 days.. Patient will call office if needed prior to next encounter  [name], RN

## 2020-05-02 ENCOUNTER — Other Ambulatory Visit: Payer: Self-pay

## 2020-05-02 ENCOUNTER — Emergency Department (HOSPITAL_COMMUNITY): Payer: BC Managed Care – PPO

## 2020-05-02 ENCOUNTER — Observation Stay (HOSPITAL_COMMUNITY)
Admission: EM | Admit: 2020-05-02 | Discharge: 2020-05-05 | Disposition: A | Discharge status: Home / Self Care | Payer: BC Managed Care – PPO | Attending: Family Medicine | Admitting: Family Medicine

## 2020-05-02 ENCOUNTER — Encounter (HOSPITAL_COMMUNITY): Payer: Self-pay | Admitting: Emergency Medicine

## 2020-05-02 DIAGNOSIS — D509 Iron deficiency anemia, unspecified: Secondary | ICD-10-CM | POA: Diagnosis present

## 2020-05-02 DIAGNOSIS — Z823 Family history of stroke: Secondary | ICD-10-CM | POA: Diagnosis not present

## 2020-05-02 DIAGNOSIS — E876 Hypokalemia: Secondary | ICD-10-CM | POA: Diagnosis present

## 2020-05-02 DIAGNOSIS — G7000 Myasthenia gravis without (acute) exacerbation: Secondary | ICD-10-CM | POA: Diagnosis not present

## 2020-05-02 DIAGNOSIS — Z91013 Allergy to seafood: Secondary | ICD-10-CM | POA: Diagnosis not present

## 2020-05-02 DIAGNOSIS — K219 Gastro-esophageal reflux disease without esophagitis: Secondary | ICD-10-CM | POA: Insufficient documentation

## 2020-05-02 DIAGNOSIS — Z79899 Other long term (current) drug therapy: Secondary | ICD-10-CM | POA: Diagnosis not present

## 2020-05-02 DIAGNOSIS — F418 Other specified anxiety disorders: Secondary | ICD-10-CM | POA: Diagnosis present

## 2020-05-02 DIAGNOSIS — B9620 Unspecified Escherichia coli [E. coli] as the cause of diseases classified elsewhere: Secondary | ICD-10-CM | POA: Diagnosis present

## 2020-05-02 DIAGNOSIS — F411 Generalized anxiety disorder: Secondary | ICD-10-CM | POA: Diagnosis present

## 2020-05-02 DIAGNOSIS — J984 Other disorders of lung: Secondary | ICD-10-CM | POA: Diagnosis present

## 2020-05-02 DIAGNOSIS — Z885 Allergy status to narcotic agent status: Secondary | ICD-10-CM

## 2020-05-02 DIAGNOSIS — M170 Bilateral primary osteoarthritis of knee: Secondary | ICD-10-CM | POA: Diagnosis present

## 2020-05-02 DIAGNOSIS — Z880 Allergy status to penicillin: Secondary | ICD-10-CM

## 2020-05-02 DIAGNOSIS — Z888 Allergy status to other drugs, medicaments and biological substances status: Secondary | ICD-10-CM

## 2020-05-02 DIAGNOSIS — Z86010 Personal history of colonic polyps: Secondary | ICD-10-CM

## 2020-05-02 DIAGNOSIS — N390 Urinary tract infection, site not specified: Secondary | ICD-10-CM | POA: Insufficient documentation

## 2020-05-02 DIAGNOSIS — J9811 Atelectasis: Secondary | ICD-10-CM | POA: Diagnosis not present

## 2020-05-02 DIAGNOSIS — G4733 Obstructive sleep apnea (adult) (pediatric): Secondary | ICD-10-CM | POA: Diagnosis present

## 2020-05-02 DIAGNOSIS — I10 Essential (primary) hypertension: Secondary | ICD-10-CM | POA: Diagnosis present

## 2020-05-02 DIAGNOSIS — Z825 Family history of asthma and other chronic lower respiratory diseases: Secondary | ICD-10-CM

## 2020-05-02 DIAGNOSIS — G43909 Migraine, unspecified, not intractable, without status migrainosus: Secondary | ICD-10-CM | POA: Diagnosis present

## 2020-05-02 DIAGNOSIS — Z6841 Body Mass Index (BMI) 40.0 and over, adult: Secondary | ICD-10-CM | POA: Diagnosis not present

## 2020-05-02 DIAGNOSIS — R0602 Shortness of breath: Secondary | ICD-10-CM

## 2020-05-02 DIAGNOSIS — Z8249 Family history of ischemic heart disease and other diseases of the circulatory system: Secondary | ICD-10-CM

## 2020-05-02 DIAGNOSIS — Z20822 Contact with and (suspected) exposure to covid-19: Secondary | ICD-10-CM | POA: Diagnosis present

## 2020-05-02 DIAGNOSIS — Z87891 Personal history of nicotine dependence: Secondary | ICD-10-CM | POA: Diagnosis not present

## 2020-05-02 DIAGNOSIS — R0609 Other forms of dyspnea: Secondary | ICD-10-CM | POA: Diagnosis present

## 2020-05-02 DIAGNOSIS — G7001 Myasthenia gravis with (acute) exacerbation: Secondary | ICD-10-CM | POA: Diagnosis not present

## 2020-05-02 DIAGNOSIS — R0600 Dyspnea, unspecified: Principal | ICD-10-CM | POA: Insufficient documentation

## 2020-05-02 NOTE — ED Triage Notes (Signed)
Patient here for complaint of shortness of breath that started a couple of days ago, history of myasthenia gravis. Shortness of breath increases when laying down. Denies chest pain, patient alert, oriented, and in no apparent distress at this time.

## 2020-05-02 NOTE — ED Provider Notes (Signed)
[place] EMERGENCY DEPARTMENT Provider Note   CSN: [id number] Arrival date & time: 05/02/20  1542     History Chief Complaint  Patient presents with  . Shortness of Breath    ALISEN MARSIGLIA is a 60 y.o. female.  Patient presents to the emergency department for evaluation of shortness of breath.  Patient reports that she first noticed shortness of breath when lying on her back several days ago.  This has worsened and today she has noticed that she is short of breath when she walks around.  She feels a tightness in her chest with this.  Patient has a history of myasthenia gravis.  Her neurologist at [facility] told her to come to the ER to be evaluated.        Past Medical History:  Diagnosis Date  . Acute respiratory failure ([place])   . Acute respiratory failure with hypoxemia ([place])   . Aspiration into airway   . Back pain 07/31/2017  . Benign neoplasm of rectum   . CAP (community acquired pneumonia) 08/05/2015  . Depression with anxiety   . Diverticulosis   . Dysphagia 03/2015    EGD, Dr Carlean Purl. mild antral gastritis, ? due to Ibuprofen.  no stricture but empirically maloney dilated esophagus.   [name] Dysphagia, neurologic   . E. coli UTI 04/07/2015  . Elevated LDH 05/06/2015  . Encounter for routine gynecological examination 10/19/2015  . Endotracheally intubated   . Enteritis due to Clostridium difficile   . Esophageal dysmotility 11/08/2015  . Fibroid uterus    size of a dime  . Gastrostomy infection ([place]) 11/08/2015  . Gastrostomy infection ([place]) 11/08/2015  . GERD (gastroesophageal reflux disease)    hiatal hernia  . Hypertension    "went away when I stopped smoking"  . Knee injury   . Left hip pain 02/18/2017  . Migraine    "maybe couple times/month" (03/20/2015)  . Mild intermittent asthma 06/22/2017  . Myasthenia gravis ([place]) 2017  . Myasthenia gravis with acute exacerbation ([place]) 05/15/2015  . Osteoarthritis of left knee 12/02/2013  . Osteoarthritis of right  knee 08/30/2013  . Pancreatitis 07/25/2017  . Protein-calorie malnutrition, severe ([place]) 08/07/2015  . Seasonal allergies    takes Zytrec  . Tracheostomy status ([place])   . Tubular adenoma of colon   . Tumors    "in my stomach"  . Umbilical hernia    watching , no plans for surgery at present  . Urine incontinence 10/19/2015  . VAP (ventilator-associated pneumonia) [facility])     Patient Active Problem List   Diagnosis Date Noted  . Allergic rhinosinusitis 04/05/2020  . Abdominal pain 08/26/2019  . Hip pain, bilateral 08/26/2019  . Positive ANA (antinuclear antibody) 08/26/2019  . Tremor 07/19/2019  . History of colonic polyps   . Epigastric pain   . Early satiety   . Macromastia 06/17/2019  . Seasonal allergies 02/18/2019  . GAD (generalized anxiety disorder) 08/06/2018  . Iron deficiency anemia 07/31/2017  . Pre-diabetes 07/31/2017  . Osteoarthritis of left hip 02/18/2017  . OSA on CPAP 09/11/2016  . Restrictive lung disease 10/11/2015  . Healthcare maintenance 09/14/2015  . Major depression   . Headache, migraine   . Shortness of breath   . Splenomegaly 04/07/2015  . Myasthenia gravis ([place])   . Osteoarthritis of left knee 12/02/2013  . Benign neoplasm of descending colon 09/19/2013  . Osteoarthritis of right knee 08/30/2013  . GERD (gastroesophageal reflux disease) 06/23/2013  . Morbid obesity ([place])  06/23/2013  . Fibroids 06/23/2013    Past Surgical History:  Procedure Laterality Date  . BIOPSY  07/05/2019   Procedure: BIOPSY;  Surgeon: Ladene Artist, MD;  Location: [facility];  Service: Endoscopy;;  . [place]; 1989  . COLONOSCOPY WITH PROPOFOL N/A 09/19/2013   Procedure: COLONOSCOPY WITH PROPOFOL;  Surgeon: Ladene Artist, MD;  Location: [facility] ENDOSCOPY;  Service: Endoscopy;  Laterality: N/A;  . COLONOSCOPY WITH PROPOFOL N/A 07/05/2019   Procedure: COLONOSCOPY WITH PROPOFOL;  Surgeon: Ladene Artist, MD;  Location: [facility] ENDOS[facility];  Service: Endoscopy;   Laterality: N/A;  . DILATION AND CURETTAGE OF UTERUS    . ESOPHAGOGASTRODUODENOSCOPY N/A 08/10/2015   Procedure: ESOPHAGOGASTRODUODENOSCOPY (EGD);  Surgeon: Gatha Mayer, MD;  Location: [facility] ENDOSCOPY;  Service: Endoscopy;  Laterality: N/A;  . ESOPHAGOGASTRODUODENOSCOPY (EGD) WITH PROPOFOL N/A 03/21/2015   Procedure: ESOPHAGOGASTRODUODENOSCOPY (EGD) WITH PROPOFOL;  Surgeon: Gatha Mayer, MD;  Location: [facility];  Service: Endoscopy;  Laterality: N/A;  . ESOPHAGOGASTRODUODENOSCOPY (EGD) WITH PROPOFOL N/A 07/05/2019   Procedure: ESOPHAGOGASTRODUODENOSCOPY (EGD) WITH PROPOFOL;  Surgeon: Ladene Artist, MD;  Location: [facility];  Service: Endoscopy;  Laterality: N/A;  . PARTIAL KNEE ARTHROPLASTY Right 08/30/2013   Procedure: RIGHT UNICOMPARTMENTAL KNEE;  Surgeon: Johnny Bridge, MD;  Location: [facility];  Service: Orthopedics;  Laterality: Right;  . PARTIAL KNEE ARTHROPLASTY Left 12/02/2013   Procedure: LEFT KNEE UNI ARTHROPLASTY;  Surgeon: Johnny Bridge, MD;  Location: [facility];  Service: Orthopedics;  Laterality: Left;  . PEG PLACEMENT N/A 08/10/2015   Procedure: PERCUTANEOUS ENDOSCOPIC GASTROSTOMY (PEG) PLACEMENT;  Surgeon: Gatha Mayer, MD;  Location: [facility];  Service: Endoscopy;  Laterality: N/A;  . POLYPECTOMY  07/05/2019   Procedure: POLYPECTOMY;  Surgeon: Ladene Artist, MD;  Location: [facility];  Service: Endoscopy;;  . TUBAL LIGATION  1989  . VAGINAL HYSTERECTOMY  1990's?   "apparently took out one of my ovaries at the time too cause one's missing"     OB History   No obstetric history on file.     Family History  Problem Relation Age of Onset  . Heart disease Mother 26  . Hypertension Mother   . Stroke Father   . Hypertension Father   . Other Sister        Esophageal strictures  . Anemia Sister   . COPD Brother   . Leukemia Sister   . Leukemia Sister   . Colon cancer Neg Hx     Social History   Tobacco Use  . Smoking status: Former  Smoker    Packs/day: 1.50    Years: 38.00    Pack years: 57.00    Types: Cigarettes    Start date: 03/03/1974    Quit date: 05/14/2012    Years since quitting: 7.9  . Smokeless tobacco: Never Used  . Tobacco comment: denies thoughts of restarting  Vaping Use  . Vaping Use: Former  Substance Use Topics  . Alcohol use: No    Alcohol/week: 0.0 standard drinks  . Drug use: No    Home Medications Prior to Admission medications   Medication Sig Start Date End Date Taking? Authorizing Provider  acetaminophen (TYLENOL) 500 MG tablet Take 500 mg by mouth in the morning and at bedtime.   Yes [provider]  albuterol (PROVENTIL) (2.5 MG/3ML) 0.083% nebulizer solution USE 1 VIAL IN NEBULIZER EVERY 6 HOURS AS NEEDED FOR SHORTNESS OF BREATH AND WHEEZING 09/23/18  Yes [name], MD  albuterol (VENTOLIN HFA) 108 (90 Base) MCG/ACT inhaler INHALE 2 PUFFS INTO THE LUNGS EVERY 6 HOURS AS NEEDED FOR WHEEZE OR SHORTNESS OF BREATH Patient taking differently: Inhale into the lungs every 6 (six) hours as needed for shortness of breath or wheezing. 04/26/19  Yes [name], MD  APPLE CIDER VINEGAR PO Take 1 capsule by mouth daily.   Yes [provider]  Ascorbic Acid (VITAMIN C PO) Take 1,000 mg by mouth daily.   Yes [provider]  cetirizine (ZYRTEC) 10 MG tablet TAKE 1 TABLET (10 MG TOTAL) BY MOUTH DAILY. NO THERAPEUTIC SUBSTITUTION Patient taking differently: Take 10 mg by mouth daily. 04/19/20  Yes Eniola, Phill Myron, MD  clonazePAM (KLONOPIN) 0.5 MG tablet TAKE 1 TABLET BY MOUTH TWICE A DAY AS NEEDED FOR ANXIETY Patient taking differently: Take by mouth 2 (two) times daily as needed for anxiety. 01/23/20  Yes [name], MD  Cyanocobalamin (VITAMIN B12 PO) Take 1 tablet by mouth daily.   Yes [provider]  docusate sodium (COLACE) 100 MG capsule Take 1 capsule (100 mg total) by mouth 2 (two) times daily as needed for mild constipation or moderate  constipation. 06/21/18  Yes [name], MD  ferrous sulfate 325 (65 FE) MG tablet Take 1 tablet (325 mg total) by mouth daily. 10/03/19  Yes [name], MD  fluticasone (FLONASE) 50 MCG/ACT nasal spray Place 2 sprays into both nostrils daily. Patient taking differently: Place 2 sprays into both nostrils daily as needed for allergies. 04/02/20  Yes Carollee Leitz, MD  gabapentin (NEURONTIN) 300 MG capsule Take 300 mg by mouth 3 (three) times daily as needed (pain).   Yes [provider]  guaiFENesin (ROBITUSSIN) 100 MG/5ML SOLN Take 15 mLs by mouth daily as needed for cough or to loosen phlegm.   Yes [provider]  Menthol 10 MG LOZG Take 1 lozenge by mouth daily as needed (cough).   Yes [provider]  montelukast (SINGULAIR) 10 MG tablet TAKE 1 TABLET BY MOUTH EVERYDAY AT BEDTIME Patient taking differently: Take 10 mg by mouth at bedtime. 04/23/20  Yes [name], MD  mycophenolate (CELLCEPT) 500 MG tablet Take 1,000 mg by mouth 2 (two) times daily. 07/24/17  Yes [provider]  omeprazole (PRILOSEC) 20 MG capsule Take 1 capsule (20 mg total) by mouth daily. 05/27/19  Yes Levin Erp, PA  oxyCODONE (OXY IR/ROXICODONE) 5 MG immediate release tablet Take 5 mg by mouth every 6 (six) hours as needed for moderate pain. 04/10/20  Yes [provider]  sertraline (ZOLOFT) 50 MG tablet TAKE 3 TABLETS BY MOUTH EVERY DAY Patient taking differently: Take 50 mg by mouth 3 (three) times daily. 05/05/19  Yes Andrena Mews T, MD  VITAMIN D, CHOLECALCIFEROL, PO Take 1 tablet by mouth daily. otc   Yes [provider]  Vitamin D, Ergocalciferol, (DRISDOL) 1.25 MG (50000 UNIT) CAPS capsule Take 50,000 Units by mouth once a week. 11/25/19  Yes [provider]  carbamide peroxide (DEBROX) 6.5 % OTIC solution Place 5 drops into the left ear 2 (two) times daily. Patient not taking: Reported on 05/03/2020 04/02/20   Carollee Leitz, MD     Allergies    Ciprofloxacin, Naproxen, Shrimp [shellfish allergy], Dicyclomine, Methocarbamol, Penicillins, Pineapple, and Morphine and related  Review of Systems   Review of Systems  Respiratory: Positive for chest tightness and shortness of breath.   All other systems reviewed and are negative.   Physical  Exam Updated Vital Signs BP 91/67   Pulse 70   Temp 98 F (36.7 C) (Oral)   Resp (!) 23   SpO2 98%   Physical Exam Vitals and nursing note reviewed.  Constitutional:      General: She is not in acute distress.    Appearance: Normal appearance. She is well-developed and well-nourished.  HENT:     Head: Normocephalic and atraumatic.     Right Ear: Hearing normal.     Left Ear: Hearing normal.     Nose: Nose normal.     Mouth/Throat:     Mouth: Oropharynx is clear and moist and mucous membranes are normal.  Eyes:     Extraocular Movements: EOM normal.     Conjunctiva/sclera: Conjunctivae normal.     Pupils: Pupils are equal, round, and reactive to light.  Cardiovascular:     Rate and Rhythm: Regular rhythm.     Heart sounds: S1 normal and S2 normal. No murmur heard. No friction rub. No gallop.   Pulmonary:     Effort: Pulmonary effort is normal. No respiratory distress.     Breath sounds: Normal breath sounds.  Chest:     Chest wall: No tenderness.  Abdominal:     General: Bowel sounds are normal.     Palpations: Abdomen is soft. There is no hepatosplenomegaly.     Tenderness: There is no abdominal tenderness. There is no guarding or rebound. Negative signs include Murphy's sign and McBurney's sign.     Hernia: No hernia is present.  Musculoskeletal:        General: Normal range of motion.     Cervical back: Normal range of motion and neck supple.     Right lower leg: Edema present.     Left lower leg: Edema present.  Skin:    General: Skin is warm, dry and intact.     Findings: No rash.     Nails: There is no cyanosis.  Neurological:     Mental Status:  She is alert and oriented to person, place, and time.     GCS: GCS eye subscore is 4. GCS verbal subscore is 5. GCS motor subscore is 6.     Cranial Nerves: No cranial nerve deficit.     Sensory: No sensory deficit.     Coordination: Coordination normal.     Deep Tendon Reflexes: Strength normal.  Psychiatric:        Mood and Affect: Mood and affect normal.        Speech: Speech normal.        Behavior: Behavior normal.        Thought Content: Thought content normal.     ED Results / Procedures / Treatments   Labs (all labs ordered are listed, but only abnormal results are displayed) Labs Reviewed  CBC WITH DIFFERENTIAL/PLATELET - Abnormal; Notable for the following components:      Result Value   RBC 5.21 (*)    Hemoglobin 11.6 (*)    MCV 73.7 (*)    MCH 22.3 (*)    RDW 19.8 (*)    All other components within normal limits  COMPREHENSIVE METABOLIC PANEL - Abnormal; Notable for the following components:   Potassium 3.3 (*)    Glucose, Bld 105 (*)    Albumin 3.4 (*)    All other components within normal limits  BRAIN NATRIURETIC PEPTIDE - Abnormal; Notable for the following components:   B Natriuretic Peptide 198.9 (*)    All  other components within normal limits  URINALYSIS, ROUTINE W REFLEX MICROSCOPIC - Abnormal; Notable for the following components:   APPearance HAZY (*)    Nitrite POSITIVE (*)    Leukocytes,Ua TRACE (*)    Bacteria, UA RARE (*)    All other components within normal limits  URINE CULTURE  TROPONIN I (HIGH SENSITIVITY)  TROPONIN I (HIGH SENSITIVITY)    EKG EKG Interpretation  Date/Time:  Wednesday May 02 2020 15:50:23 EST Ventricular Rate:  77 PR Interval:  166 QRS Duration: 68 QT Interval:  364 QTC Calculation: 411 R Axis:   50 Text Interpretation: Normal sinus rhythm Low voltage QRS Nonspecific ST abnormality Abnormal ECG No significant change since last tracing Confirmed by Orpah Greek [phone]) on 05/02/2020 11:21:17  PM   Radiology DG Chest 2 View  Result Date: 05/02/2020 CLINICAL DATA:  Shortness of breath EXAM: CHEST - 2 VIEW COMPARISON:  02/04/2018 FINDINGS: Cardiac shadow is within normal limits. Mild right basilar scarring is noted stable from the prior study. No new focal infiltrate or effusion is seen. Degenerative changes of the thoracic spine are noted. IMPRESSION: Mild scarring in the right lung base.  No acute abnormality noted. Electronically Signed   By: Inez Catalina M.D.   On: 05/02/2020 16:45    Procedures Procedures   Medications Ordered in ED Medications  cetirizine HCl (Zyrtec) 5 MG/5ML solution 5 mg (5 mg Oral Given 05/03/20 0342)  furosemide (LASIX) injection 40 mg (40 mg Intravenous Given 05/03/20 0507)    ED Course  I have reviewed the triage vital signs and the nursing notes.  Pertinent labs & imaging results that were available during my care of the patient were reviewed by me and considered in my medical decision making (see chart for details).    MDM Rules/Calculators/A&P                          Patient presents to the emergency department for evaluation of shortness of breath.  Patient initially noticed shortness of breath when she lays flat, now she is experiencing dyspnea on exertion as well.  Patient does have a history of myasthenia gravis which appears to be fairly well controlled on CellCept.  She does not have any ptosis, vision change including diplopia, dysarthria or dysphagia.  Facial muscles, neck and limb strength normal.  Discussed with Dr. Theda Sers, on-call for neurology.  Based on her examination, he feels that this is not secondary to her myasthenia gravis.  Does not require any specific neurology intervention.  Examination reveals bilateral lower extremity edema.  She does, however, report that this is somewhat chronic for her.  Chest x-ray does not show any overt edema.  BNP is mildly elevated.  Patient given Lasix with some diuresis.  She reports slight  improvement as she is diuresing but now is experiencing more pleuritic chest pain.  Will perform CT angiography to rule out PE. Will sign out to oncoming ER physician.   Final Clinical Impression(s) / ED Diagnoses Final diagnoses:  SOB (shortness of breath)    Rx / DC Orders ED Discharge Orders    None       Rebekka Lobello, Gwenyth Allegra, MD 05/03/20 0700

## 2020-05-03 ENCOUNTER — Other Ambulatory Visit: Payer: Self-pay

## 2020-05-03 ENCOUNTER — Encounter (HOSPITAL_COMMUNITY): Payer: Self-pay | Admitting: Family Medicine

## 2020-05-03 ENCOUNTER — Emergency Department (HOSPITAL_COMMUNITY): Payer: BC Managed Care – PPO

## 2020-05-03 ENCOUNTER — Observation Stay (HOSPITAL_BASED_OUTPATIENT_CLINIC_OR_DEPARTMENT_OTHER): Payer: BC Managed Care – PPO

## 2020-05-03 DIAGNOSIS — G7001 Myasthenia gravis with (acute) exacerbation: Secondary | ICD-10-CM | POA: Diagnosis not present

## 2020-05-03 DIAGNOSIS — R0602 Shortness of breath: Secondary | ICD-10-CM | POA: Diagnosis not present

## 2020-05-03 DIAGNOSIS — G7000 Myasthenia gravis without (acute) exacerbation: Secondary | ICD-10-CM

## 2020-05-03 DIAGNOSIS — R0600 Dyspnea, unspecified: Secondary | ICD-10-CM | POA: Diagnosis not present

## 2020-05-03 DIAGNOSIS — J9811 Atelectasis: Secondary | ICD-10-CM | POA: Diagnosis not present

## 2020-05-03 DIAGNOSIS — R0609 Other forms of dyspnea: Secondary | ICD-10-CM | POA: Diagnosis present

## 2020-05-03 LAB — ECHOCARDIOGRAM COMPLETE
AR max vel: 2.08 cm2
AV Area VTI: 2.18 cm2
AV Area mean vel: 2.13 cm2
AV Mean grad: 5 mmHg
AV Peak grad: 11.4 mmHg
Ao pk vel: 1.69 m/s
Area-P 1/2: 2.91 cm2
Calc EF: 65.7 %
S' Lateral: 2.4 cm
Single Plane A2C EF: 62.8 %
Single Plane A4C EF: 70.4 %

## 2020-05-03 LAB — RESP PANEL BY RT-PCR (FLU A&B, COVID) ARPGX2
Influenza A by PCR: NEGATIVE
Influenza B by PCR: NEGATIVE
SARS Coronavirus 2 by RT PCR: NEGATIVE

## 2020-05-03 LAB — COMPREHENSIVE METABOLIC PANEL
ALT: 14 U/L (ref 0–44)
AST: 18 U/L (ref 15–41)
Albumin: 3.4 g/dL — ABNORMAL LOW (ref 3.5–5.0)
Alkaline Phosphatase: 75 U/L (ref 38–126)
Anion gap: 9 (ref 5–15)
BUN: 7 mg/dL (ref 6–20)
CO2: 24 mmol/L (ref 22–32)
Calcium: 8.9 mg/dL (ref 8.9–10.3)
Chloride: 103 mmol/L (ref 98–111)
Creatinine, Ser: 0.59 mg/dL (ref 0.44–1.00)
GFR, Estimated: >60 mL/min (ref >60)
Glucose, Bld: 105 mg/dL — ABNORMAL HIGH (ref 70–99)
Potassium: 3.3 mmol/L — ABNORMAL LOW (ref 3.5–5.1)
Sodium: 136 mmol/L (ref 135–145)
Total Bilirubin: 0.5 mg/dL (ref 0.3–1.2)
Total Protein: 6.7 g/dL (ref 6.5–8.1)

## 2020-05-03 LAB — URINALYSIS, ROUTINE W REFLEX MICROSCOPIC
Bilirubin Urine: NEGATIVE
Glucose, UA: NEGATIVE mg/dL
Hgb urine dipstick: NEGATIVE
Ketones, ur: NEGATIVE mg/dL
Nitrite: POSITIVE — AB
Protein, ur: NEGATIVE mg/dL
Specific Gravity, Urine: 1.012 (ref 1.005–1.030)
pH: 6 (ref 5.0–8.0)

## 2020-05-03 LAB — CBC WITH DIFFERENTIAL/PLATELET
Abs Immature Granulocytes: 0.03 10*3/uL (ref 0.00–0.07)
Basophils Absolute: 0 10*3/uL (ref 0.0–0.1)
Basophils Relative: 0 %
Eosinophils Absolute: 0 10*3/uL (ref 0.0–0.5)
Eosinophils Relative: 0 %
HCT: 38.4 % (ref 36.0–46.0)
Hemoglobin: 11.6 g/dL — ABNORMAL LOW (ref 12.0–15.0)
Immature Granulocytes: 0 %
Lymphocytes Relative: 25 %
Lymphs Abs: 2.1 10*3/uL (ref 0.7–4.0)
MCH: 22.3 pg — ABNORMAL LOW (ref 26.0–34.0)
MCHC: 30.2 g/dL (ref 30.0–36.0)
MCV: 73.7 fL — ABNORMAL LOW (ref 80.0–100.0)
Monocytes Absolute: 0.4 10*3/uL (ref 0.1–1.0)
Monocytes Relative: 5 %
Neutro Abs: 5.9 10*3/uL (ref 1.7–7.7)
Neutrophils Relative %: 70 %
Platelets: 151 10*3/uL (ref 150–400)
RBC: 5.21 MIL/uL — ABNORMAL HIGH (ref 3.87–5.11)
RDW: 19.8 % — ABNORMAL HIGH (ref 11.5–15.5)
WBC: 8.4 10*3/uL (ref 4.0–10.5)
nRBC: 0 % (ref 0.0–0.2)

## 2020-05-03 LAB — TROPONIN I (HIGH SENSITIVITY)
Troponin I (High Sensitivity): 3 ng/L (ref <18)
Troponin I (High Sensitivity): 4 ng/L (ref <18)

## 2020-05-03 LAB — BRAIN NATRIURETIC PEPTIDE: B Natriuretic Peptide: 198.9 pg/mL — ABNORMAL HIGH (ref 0.0–100.0)

## 2020-05-03 MED ORDER — LORATADINE 10 MG PO TABS
10.0000 mg | ORAL_TABLET | Freq: Every day | ORAL | Status: DC
  Administered 2020-05-03 – 2020-05-05 (×3): 10 mg via ORAL
  Filled 2020-05-03 (×3): qty 1

## 2020-05-03 MED ORDER — DOCUSATE SODIUM 100 MG PO CAPS
100.0000 mg | ORAL_CAPSULE | Freq: Two times a day (BID) | ORAL | Status: DC | PRN
  Administered 2020-05-05: 100 mg via ORAL
  Filled 2020-05-03: qty 1

## 2020-05-03 MED ORDER — IOHEXOL 350 MG/ML SOLN
100.0000 mL | Freq: Once | INTRAVENOUS | Status: AC | PRN
  Administered 2020-05-03: 80 mL via INTRAVENOUS

## 2020-05-03 MED ORDER — GABAPENTIN 300 MG PO CAPS: 300.0000 mg | ORAL_CAPSULE | Freq: Three times a day (TID) | ORAL | Status: DC | PRN

## 2020-05-03 MED ORDER — MYCOPHENOLATE MOFETIL 250 MG PO CAPS
1000.0000 mg | ORAL_CAPSULE | Freq: Two times a day (BID) | ORAL | Status: DC
  Administered 2020-05-03 – 2020-05-05 (×5): 1000 mg via ORAL
  Filled 2020-05-03 (×7): qty 4

## 2020-05-03 MED ORDER — MONTELUKAST SODIUM 10 MG PO TABS
10.0000 mg | ORAL_TABLET | Freq: Every day | ORAL | Status: DC
  Administered 2020-05-03 – 2020-05-04 (×2): 10 mg via ORAL
  Filled 2020-05-03 (×2): qty 1

## 2020-05-03 MED ORDER — CLONAZEPAM 0.5 MG PO TABS
0.5000 mg | ORAL_TABLET | Freq: Every day | ORAL | Status: DC | PRN
  Administered 2020-05-03: 0.5 mg via ORAL
  Filled 2020-05-03: qty 1

## 2020-05-03 MED ORDER — FUROSEMIDE 10 MG/ML IJ SOLN
40.0000 mg | Freq: Once | INTRAMUSCULAR | Status: AC
  Administered 2020-05-03: 40 mg via INTRAVENOUS
  Filled 2020-05-03: qty 4

## 2020-05-03 MED ORDER — SERTRALINE HCL 50 MG PO TABS
50.0000 mg | ORAL_TABLET | Freq: Every day | ORAL | Status: DC | PRN
Start: 2020-05-03 — End: 2020-05-04

## 2020-05-03 MED ORDER — ACETAMINOPHEN 650 MG RE SUPP: 650.0000 mg | Freq: Four times a day (QID) | RECTAL | Status: DC | PRN

## 2020-05-03 MED ORDER — FLUTICASONE PROPIONATE 50 MCG/ACT NA SUSP
2.0000 | Freq: Every day | NASAL | Status: DC | PRN
  Filled 2020-05-03: qty 16

## 2020-05-03 MED ORDER — FERROUS SULFATE 325 (65 FE) MG PO TABS
325.0000 mg | ORAL_TABLET | Freq: Every day | ORAL | Status: DC
Start: 2020-05-03 — End: 2020-05-05
  Administered 2020-05-03 – 2020-05-05 (×3): 325 mg via ORAL
  Filled 2020-05-03 (×3): qty 1

## 2020-05-03 MED ORDER — ACETAMINOPHEN 325 MG PO TABS
650.0000 mg | ORAL_TABLET | Freq: Four times a day (QID) | ORAL | Status: DC | PRN
  Administered 2020-05-03 – 2020-05-04 (×2): 650 mg via ORAL
  Filled 2020-05-03 (×2): qty 2

## 2020-05-03 MED ORDER — CETIRIZINE HCL 5 MG/5ML PO SOLN
5.0000 mg | ORAL | Status: AC
  Administered 2020-05-03: 5 mg via ORAL
  Filled 2020-05-03: qty 5

## 2020-05-03 MED ORDER — ENOXAPARIN SODIUM 40 MG/0.4ML ~~LOC~~ SOLN
40.0000 mg | SUBCUTANEOUS | Status: DC
  Filled 2020-05-03: qty 0.4

## 2020-05-03 MED ORDER — VITAMIN B-12 100 MCG PO TABS
100.0000 ug | ORAL_TABLET | Freq: Every day | ORAL | Status: DC
  Administered 2020-05-03 – 2020-05-05 (×3): 100 ug via ORAL
  Filled 2020-05-03 (×3): qty 1

## 2020-05-03 MED ORDER — ALBUTEROL SULFATE HFA 108 (90 BASE) MCG/ACT IN AERS
2.0000 | INHALATION_SPRAY | RESPIRATORY_TRACT | Status: DC | PRN
  Filled 2020-05-03: qty 6.7

## 2020-05-03 MED ORDER — PANTOPRAZOLE SODIUM 40 MG PO TBEC
40.0000 mg | DELAYED_RELEASE_TABLET | Freq: Every day | ORAL | Status: DC
  Administered 2020-05-04 – 2020-05-05 (×2): 40 mg via ORAL
  Filled 2020-05-03 (×2): qty 1

## 2020-05-03 NOTE — ED Notes (Signed)
RT consulted for negative inspiratory force and vital capacity order.

## 2020-05-03 NOTE — Progress Notes (Signed)
Patient performed a NIF of -40 and a VC of 1.2L with great effort on first attempt.

## 2020-05-03 NOTE — Plan of Care (Signed)
  Problem: Clinical Measurements: Goal: Ability to maintain clinical measurements within normal limits will improve Outcome: Progressing Goal: Will remain free from infection Outcome: Progressing Goal: Diagnostic test results will improve Outcome: Progressing Goal: Respiratory complications will improve Outcome: Progressing Goal: Cardiovascular complication will be avoided Outcome: Progressing   Problem: Activity: Goal: Risk for activity intolerance will decrease Outcome: Progressing   Problem: Nutrition: Goal: Adequate nutrition will be maintained Outcome: Progressing   Problem: Coping: Goal: Level of anxiety will decrease Outcome: Progressing   Problem: Elimination: Goal: Will not experience complications related to bowel motility Outcome: Progressing Goal: Will not experience complications related to urinary retention Outcome: Progressing   Problem: Pain Managment: Goal: General experience of comfort will improve Outcome: Progressing   

## 2020-05-03 NOTE — H&P (Signed)
[facility] Admission History and Physical Service Pager: [phone]  Patient name: Frances Medina Medical record number: [id number] Date of birth: 08-26-1960 Age: 60 y.o. Gender: female  Primary Care Provider: [name], MD Consultants: Neuro  Code Status: Full Preferred Emergency Contact:  Christie Nottingham, spouse, [phone].   Delray Alt, daughter, [phone]  Chief Complaint: dyspnea on exertion   Assessment and Plan: Frances Medina is a 60 y.o. female presenting with acute dyspnea on exertion. PMH is significant for well-controlled myasthenia gravis on mycophenolate, osteoarthritis, morbid obesity, GERD, migraine headaches, iron deficiency anemia, OSA on CPAP, mild restrictive lung disease evident on PFTs, and anxiety.  Acute dyspnea on exertion without hypoxia: Stable. Relatively sudden onset of DOE with orthopnea in the past few days. Unclear etiology, may be multifactorial and considering MG exacerbation, deconditioning, heart failure/anginal equivalent.  Reassured that she has been neurologically intact since arrival, however given her history of worsening dyspnea on exertion with episode of diplopia/ptosis yesterday in the setting of her known myasthenia gravis, do have concern for early presentation of exacerbation. Do note she had a similar presentation prior to ICU admission in 2017.  Considered new onset heart failure given presentation with elevated BNP at 198, however without improvement following Lasix, lung edema on CT, or significant physical manifestations concerning for volume overload suspect this is less likely.  Lastly, had a low risk Lexiscan Myoview in 04/2019 with EKG NSR and troponin WNL X2, making acute MI or cardiac etiology less likely. However given her risk factors and significant family history could consider further cardiac evaluation if no longer concerned for MG.  Minimal lung restriction on PFTs in 2020 without change  in chronic cough, doubt exacerbation or COPD manifestation. Fortunately, no evidence of PE, pneumonia, pneumothorax, or edema on CTA.  No worsening anemia or renal/liver function contributing.  Flu/Covid negative. -Admit to cardiac telemetry, attending Dr. Nori Riis -Neurology formally consulted, appreciate recommendations -Monitor telemetry and pulse ox -Consult respiratory therapy  -Albuterol as needed -Obtain echocardiogram -Daily CMP, CBC  Acute left-sided headache, in setting of chronic migraines: Improving. Started yesterday with associated left eye twitching and ptosis, only mild residual headache remaining.  Neurologically intact currently.  May be related to above concerns vs related to history of previous migraines. -Tylenol as needed -Evaluation as above  Abnormal U/A: U/a showing positive nitrite with trace leukocytes, 0-5 squamous epithelium.  She endorses chronic urinary frequency, however denies any recent dysuria, urgency, or suprapubic tenderness.  Given no significant urinary symptoms, will not treat.  Pending urine culture already collected in the ED, could consider treatment if develops symptoms. -Follow urine culture  Myasthenia gravis: Chronic. Diagnosed in 2016/2017, has required one ICU admission/intubation since that time and overall has had appropriate control.  Currently follows with [facility] neurolog[facility], Dr. Venetia Maxon.  Taking mycophenolate twice daily without any missed doses.   -Continue mycophenolate twice daily -RT/neuro as above  OSA on CPAP: Chronic, stable. -Offer CPAP nightly  Iron deficiency anemia: Chronic, stable. Hemoglobin 11.6, at baseline around 10-11.  -Monitor CBC -Continue home ferrous sulfate daily  Generalized anxiety: Chronic, stable. Currently well controlled.  Takes Klonopin 0.5 mg 1-2 times daily as needed in addition to Zoloft 50 mg as needed (she understands this has been prescribed as 150mg  daily).  -Continue Klonopin and Zoloft as  needed -Consider further discussion on scheduling Zoloft daily  Restrictive lung disease: Very mild on PFTs in 2020.  Likely in the setting of BMI/OSA. -Albuterol as needed  Hypokalemia: Acute. K 3.3. -Give K-Dur 40 meq -Check BMP in am   Prediabetes: Chronic, stable. A1c 6.0 in 2019. -Check A1c -Already working towards weight loss  FEN/GI: heart healthy  Prophylaxis: Lovenox  Disposition: Pending work-up as above.  History of Present Illness:  Frances Medina is a 60 y.o. female presenting with dyspnea on exertion.   Reports a few day history of dyspnea on exertion and orthopnea. Noticed it was a sudden change to her previous. She wears a CPAP for OSA, felt like she was suffocating. Got worse yesterday and called her neurologist who recommended ED evaluation. L eye twiching eyelid ptosis with a headache yesterday for a few hours, which has largely resolved, still has some residual mild headache. She does have occasional cough at her baseline. Feels some pressure in her chest with she feels short of breath doing activities.  Not present at rest.  Denies any fever, sick contacts, no new medications changes, new leg swelling. Chronic left lower leg swelling not worse than usual. No recent injury or trauma. Albuterol at home helped a little bit.   Prior to this could walk around the house and do chores, almost 6000 steps daily, without dyspnea. Low risk lexiscan myoview in 04/2019. She does report a history of SOB, but feels this is much different. Smoking history from 60 yo to 40's, 1.5 packs about daily.  Mother passed away from MI at age 62.  She denies any associated extremity weakness, dysphagia, difficulty with speech, or persistent visual changes.  [facility]ist, Dr. Venetia Maxon. Diagnosed with MG a few years ago, denies a flare in the past several years.  No missed doses of her mycophenolate.  She was admitted to the ICU in 05/2015 due to myasthenia crisis with proptosis, diplopia,  and dyspnea.  Of note, she recently had an attempted laparoscopic bariatric surgery on 2/8, however due to fatty tissue it was not able to be done.  She has been trying to follow a balanced diet and working towards weight loss.  ED course: On arrival she was afebrile and hemodynamically stable.  Noted to be dyspneic on exertion without any associated hypoxia.  Chest x-ray clear and subsequent CTA without evidence of PE, pneumonia, or significant edema.  Covid and flu negative.  No leukocytosis.  Renal and liver function normal.  Anemia at baseline.  EKG NSR without significant ST changes.  BNP 198, given a trial of Lasix with minimal change in breathing.  Neurology, Dr. Theda Sers, was curb sided in the ED given her history of myasthenia gravis however recommended continued exploration of alternative etiology of DOE.  Family medicine teaching service was consulted for admission given persistent DOE.  Review Of Systems: Per HPI with the following additions:   Review of Systems  Constitutional: Positive for activity change. Negative for diaphoresis, fatigue and fever.  HENT: Negative for drooling and trouble swallowing.   Respiratory: Positive for cough, chest tightness and shortness of breath. Negative for wheezing.   Cardiovascular: Negative for palpitations and leg swelling.  Gastrointestinal: Negative for abdominal pain, constipation, diarrhea, nausea and vomiting.  Genitourinary: Negative for dysuria, flank pain, frequency and urgency.  Musculoskeletal: Negative for neck pain and neck stiffness.  Neurological: Positive for headaches. Negative for dizziness, syncope, speech difficulty, weakness and light-headedness.     Patient Active Problem List   Diagnosis Date Noted  . Allergic rhinosinusitis 04/05/2020  . Abdominal pain 08/26/2019  . Hip pain, bilateral 08/26/2019  . Positive ANA (antinuclear antibody) 08/26/2019  .  Tremor 07/19/2019  . History of colonic polyps   . Epigastric pain   .  Early satiety   . Macromastia 06/17/2019  . Seasonal allergies 02/18/2019  . GAD (generalized anxiety disorder) 08/06/2018  . Iron deficiency anemia 07/31/2017  . Pre-diabetes 07/31/2017  . Osteoarthritis of left hip 02/18/2017  . OSA on CPAP 09/11/2016  . Restrictive lung disease 10/11/2015  . Healthcare maintenance 09/14/2015  . Major depression   . Headache, migraine   . Shortness of breath   . Splenomegaly 04/07/2015  . Myasthenia gravis ([place])   . Osteoarthritis of left knee 12/02/2013  . Benign neoplasm of descending colon 09/19/2013  . Osteoarthritis of right knee 08/30/2013  . GERD (gastroesophageal reflux disease) 06/23/2013  . Morbid obesity ([place]) 06/23/2013  . Fibroids 06/23/2013    Past Medical History: Past Medical History:  Diagnosis Date  . Acute respiratory failure ([place])   . Acute respiratory failure with hypoxemia ([place])   . Aspiration into airway   . Back pain 07/31/2017  . Benign neoplasm of rectum   . CAP (community acquired pneumonia) 08/05/2015  . Depression with anxiety   . Diverticulosis   . Dysphagia 03/2015    EGD, Dr Carlean Purl. mild antral gastritis, ? due to Ibuprofen.  no stricture but empirically maloney dilated esophagus.   [name] Dysphagia, neurologic   . E. coli UTI 04/07/2015  . Elevated LDH 05/06/2015  . Encounter for routine gynecological examination 10/19/2015  . Endotracheally intubated   . Enteritis due to Clostridium difficile   . Esophageal dysmotility 11/08/2015  . Fibroid uterus    size of a dime  . Gastrostomy infection ([place]) 11/08/2015  . Gastrostomy infection ([place]) 11/08/2015  . GERD (gastroesophageal reflux disease)    hiatal hernia  . Hypertension    "went away when I stopped smoking"  . Knee injury   . Left hip pain 02/18/2017  . Migraine    "maybe couple times/month" (03/20/2015)  . Mild intermittent asthma 06/22/2017  . Myasthenia gravis ([place]) 2017  . Myasthenia gravis with acute exacerbation ([place]) 05/15/2015  . Osteoarthritis of left  knee 12/02/2013  . Osteoarthritis of right knee 08/30/2013  . Pancreatitis 07/25/2017  . Protein-calorie malnutrition, severe ([place]) 08/07/2015  . Seasonal allergies    takes Zytrec  . Tracheostomy status ([place])   . Tubular adenoma of colon   . Tumors    "in my stomach"  . Umbilical hernia    watching , no plans for surgery at present  . Urine incontinence 10/19/2015  . VAP (ventilator-associated pneumonia) [facility])     Past Surgical History: Past Surgical History:  Procedure Laterality Date  . BIOPSY  07/05/2019   Procedure: BIOPSY;  Surgeon: Ladene Artist, MD;  Location: [facility];  Service: Endoscopy;;  . [place]; 1989  . COLONOSCOPY WITH PROPOFOL N/A 09/19/2013   Procedure: COLONOSCOPY WITH PROPOFOL;  Surgeon: Ladene Artist, MD;  Location: [facility] ENDOSCOPY;  Service: Endoscopy;  Laterality: N/A;  . COLONOSCOPY WITH PROPOFOL N/A 07/05/2019   Procedure: COLONOSCOPY WITH PROPOFOL;  Surgeon: Ladene Artist, MD;  Location: [facility] ENDOSCOPY;  Service: Endoscopy;  Laterality: N/A;  . DILATION AND CURETTAGE OF UTERUS    . ESOPHAGOGASTRODUODENOSCOPY N/A 08/10/2015   Procedure: ESOPHAGOGASTRODUODENOSCOPY (EGD);  Surgeon: Gatha Mayer, MD;  Location: [facility] ENDOSCOPY;  Service: Endoscopy;  Laterality: N/A;  . ESOPHAGOGASTRODUODENOSCOPY (EGD) WITH PROPOFOL N/A 03/21/2015   Procedure: ESOPHAGOGASTRODUODENOSCOPY (EGD) WITH PROPOFOL;  Surgeon: Gatha Mayer, MD;  Location: [facility];  Service:  Endoscopy;  Laterality: N/A;  . ESOPHAGOGASTRODUODENOSCOPY (EGD) WITH PROPOFOL N/A 07/05/2019   Procedure: ESOPHAGOGASTRODUODENOSCOPY (EGD) WITH PROPOFOL;  Surgeon: Ladene Artist, MD;  Location: [facility] ENDOSCOPY;  Service: Endoscopy;  Laterality: N/A;  . PARTIAL KNEE ARTHROPLASTY Right 08/30/2013   Procedure: RIGHT UNICOMPARTMENTAL KNEE;  Surgeon: Johnny Bridge, MD;  Location: [facility];  Service: Orthopedics;  Laterality: Right;  . PARTIAL KNEE ARTHROPLASTY Left 12/02/2013   Procedure: LEFT KNEE UNI  ARTHROPLASTY;  Surgeon: Johnny Bridge, MD;  Location: [facility];  Service: Orthopedics;  Laterality: Left;  . PEG PLACEMENT N/A 08/10/2015   Procedure: PERCUTANEOUS ENDOSCOPIC GASTROSTOMY (PEG) PLACEMENT;  Surgeon: Gatha Mayer, MD;  Location: [facility];  Service: Endoscopy;  Laterality: N/A;  . POLYPECTOMY  07/05/2019   Procedure: POLYPECTOMY;  Surgeon: Ladene Artist, MD;  Location: [facility] ENDOSCOPY;  Service: Endoscopy;;  . TUBAL LIGATION  1989  . VAGINAL HYSTERECTOMY  1990's?   "apparently took out one of my ovaries at the time too cause one's missing"    Social History: Social History   Tobacco Use  . Smoking status: Former Smoker    Packs/day: 1.50    Years: 38.00    Pack years: 57.00    Types: Cigarettes    Start date: 03/03/1974    Quit date: 05/14/2012    Years since quitting: 7.9  . Smokeless tobacco: Never Used  . Tobacco comment: denies thoughts of restarting  Vaping Use  . Vaping Use: Former  Substance Use Topics  . Alcohol use: No    Alcohol/week: 0.0 standard drinks  . Drug use: No   Additional social history: Previous tobacco history as above.  Lives with spouse. Please also refer to relevant sections of EMR.  Family History: Family History  Problem Relation Age of Onset  . Heart disease Mother 63  . Hypertension Mother   . Stroke Father   . Hypertension Father   . Other Sister        Esophageal strictures  . Anemia Sister   . COPD Brother   . Leukemia Sister   . Leukemia Sister   . Colon cancer Neg Hx     Allergies and Medications: Allergies  Allergen Reactions  . Ciprofloxacin Shortness Of Breath and Rash  . Naproxen Shortness Of Breath  . Shrimp [Shellfish Allergy] Hives and Shortness Of Breath    ER required  . Dicyclomine Hives  . Methocarbamol Hives  . Penicillins Hives    Has patient had a PCN reaction causing immediate rash, facial/tongue/throat swelling, SOB or lightheadedness with hypotension: Yes Has patient had a  PCN reaction causing severe rash involving mucus membranes or skin necrosis: No Has patient had a PCN reaction that required hospitalization No Has patient had a PCN reaction occurring within the last 10 years: No If all of the above answers are "NO", then may proceed with Cephalosporin use.   [name] Pineapple Itching    Itchy lips and tongue  . Morphine And Related Other (See Comments)    Severe headache   No current facility-administered medications on file prior to encounter.   Current Outpatient Medications on File Prior to Encounter  Medication Sig Dispense Refill  . acetaminophen (TYLENOL) 500 MG tablet Take 500 mg by mouth in the morning and at bedtime.    [name] albuterol (PROVENTIL) (2.5 MG/3ML) 0.083% nebulizer solution USE 1 VIAL IN NEBULIZER EVERY 6 HOURS AS NEEDED FOR SHORTNESS OF BREATH AND WHEEZING 150 mL 0  . albuterol (VENTOLIN HFA)  108 (90 Base) MCG/ACT inhaler INHALE 2 PUFFS INTO THE LUNGS EVERY 6 HOURS AS NEEDED FOR WHEEZE OR SHORTNESS OF BREATH (Patient taking differently: Inhale into the lungs every 6 (six) hours as needed for shortness of breath or wheezing.) 18 g 3  . APPLE CIDER VINEGAR PO Take 1 capsule by mouth daily.    . Ascorbic Acid (VITAMIN C PO) Take 1,000 mg by mouth daily.    . cetirizine (ZYRTEC) 10 MG tablet TAKE 1 TABLET (10 MG TOTAL) BY MOUTH DAILY. NO THERAPEUTIC SUBSTITUTION (Patient taking differently: Take 10 mg by mouth daily.) 90 tablet 2  . clonazePAM (KLONOPIN) 0.5 MG tablet TAKE 1 TABLET BY MOUTH TWICE A DAY AS NEEDED FOR ANXIETY (Patient taking differently: Take by mouth 2 (two) times daily as needed for anxiety.) 60 tablet 1  . Cyanocobalamin (VITAMIN B12 PO) Take 1 tablet by mouth daily.    [name] docusate sodium (COLACE) 100 MG capsule Take 1 capsule (100 mg total) by mouth 2 (two) times daily as needed for mild constipation or moderate constipation. 10 capsule 0  . ferrous sulfate 325 (65 FE) MG tablet Take 1 tablet (325 mg total) by mouth daily. 90 tablet  3  . fluticasone (FLONASE) 50 MCG/ACT nasal spray Place 2 sprays into both nostrils daily. (Patient taking differently: Place 2 sprays into both nostrils daily as needed for allergies.) 16 g 6  . gabapentin (NEURONTIN) 300 MG capsule Take 300 mg by mouth 3 (three) times daily as needed (pain).    [name] guaiFENesin (ROBITUSSIN) 100 MG/5ML SOLN Take 15 mLs by mouth daily as needed for cough or to loosen phlegm.    . Menthol 10 MG LOZG Take 1 lozenge by mouth daily as needed (cough).    . montelukast (SINGULAIR) 10 MG tablet TAKE 1 TABLET BY MOUTH EVERYDAY AT BEDTIME (Patient taking differently: Take 10 mg by mouth at bedtime.) 90 tablet 3  . mycophenolate (CELLCEPT) 500 MG tablet Take 1,000 mg by mouth 2 (two) times daily.    [name] omeprazole (PRILOSEC) 20 MG capsule Take 1 capsule (20 mg total) by mouth daily. 90 capsule 3  . oxyCODONE (OXY IR/ROXICODONE) 5 MG immediate release tablet Take 5 mg by mouth every 6 (six) hours as needed for moderate pain.    [name] sertraline (ZOLOFT) 50 MG tablet TAKE 3 TABLETS BY MOUTH EVERY DAY (Patient taking differently: Take 50 mg by mouth 3 (three) times daily.) 270 tablet 2  . VITAMIN D, CHOLECALCIFEROL, PO Take 1 tablet by mouth daily. otc    . Vitamin D, Ergocalciferol, (DRISDOL) 1.25 MG (50000 UNIT) CAPS capsule Take 50,000 Units by mouth once a week.    . carbamide peroxide (DEBROX) 6.5 % OTIC solution Place 5 drops into the left ear 2 (two) times daily. (Patient not taking: Reported on 05/03/2020) 15 mL 0    Objective: BP 117/74   Pulse 69   Temp 98 F (36.7 C) (Oral)   Resp 18   SpO2 97%  Exam: General: Alert, no acute distress, daughter at bedside Eyes: EOMI without nystagmus or pain, PERRLA, conjunctiva clear bilaterally ENTM: No nasal congestion present.  Mucous membranes moist.  No appreciable JVD. Neck: Supple without significant lymphadenopathy Cardiovascular: Distant heart sounds, however regular rate and rhythm, no murmur appreciated Respiratory: Normal  work of breathing at rest, satting appropriately on room air, clear throughout anterior/posterior lung fields able to speak in full sentences without concern however does have to catch her breath with a lot of movement  Gastrointestinal: Soft, nondistended, normal active bowel sounds, tender at recent laparoscopic insertion scars Derm/MSK: No rashes or erythema seen, nonpitting edema to bilateral lower extremity, slightly more on left chronically. Neuro: Alert and oriented.  CN II-XII intact without ptosis seen.  Able to move all extremities spontaneously with 5/5 upper and lower extremity strength bilaterally.  No muscle fatigue noted in upper extremity after several strength testings.  Neck strength preserved.  Speech easily understandable, able to follow complex commands. Psych: Normal mood and affect.  Follows normal conversation.  Labs and Imaging: CBC BMET  Recent Labs  Lab 05/02/20 2319  WBC 8.4  HGB 11.6*  HCT 38.4  PLT 151   Recent Labs  Lab 05/02/20 2319  NA 136  K 3.3*  CL 103  CO2 24  BUN 7  CREATININE 0.59  GLUCOSE 105*  CALCIUM 8.9     EKG: My own interpretation (not copied from electronic read) NSR with nonspecific ST changes, similar to previous.  DG Chest 2 View  Result Date: 05/02/2020 CLINICAL DATA:  Shortness of breath EXAM: CHEST - 2 VIEW COMPARISON:  02/04/2018 FINDINGS: Cardiac shadow is within normal limits. Mild right basilar scarring is noted stable from the prior study. No new focal infiltrate or effusion is seen. Degenerative changes of the thoracic spine are noted. IMPRESSION: Mild scarring in the right lung base.  No acute abnormality noted. Electronically Signed   By: Inez Catalina M.D.   On: 05/02/2020 16:45   CT ANGIO CHEST PE W OR WO CONTRAST  Result Date: 05/03/2020 CLINICAL DATA:  Shortness of breath EXAM: CT ANGIOGRAPHY CHEST WITH CONTRAST TECHNIQUE: Multidetector CT imaging of the chest was performed using the standard protocol during bolus  administration of intravenous contrast. Multiplanar CT image reconstructions and MIPs were obtained to evaluate the vascular anatomy. CONTRAST:  50mL OMNIPAQUE IOHEXOL 350 MG/ML SOLN COMPARISON:  06/18/2017 FINDINGS: Cardiovascular: Satisfactory pulmonary artery opacification but there is bolus dispersion, artifact from body habitus, and multiple areas of streak artifact. No visible pulmonary embolism. Normal heart size. No pericardial effusion. Negative aorta. Mediastinum/Nodes: Negative for adenopathy or mass. Lungs/Pleura: Band of atelectasis at the bases, subsegmental. There is no edema, consolidation, effusion, or pneumothorax. Upper Abdomen: No acute finding. Musculoskeletal: Spondylosis with bridging osteophytes in the lower thoracic spine. Review of the MIP images confirms the above findings. IMPRESSION: 1. Limited study due to patient factors. No evidence of pulmonary embolism. 2. Lower lobe atelectasis. Electronically Signed   By: Monte Fantasia M.D.   On: 05/03/2020 08:37    Patriciaann Clan, DO 05/03/2020, 10:08 AM PGY-3, [place] Intern pager: [phone], text pages welcome

## 2020-05-03 NOTE — Consult Note (Signed)
Neurology Consultation  Reason for Consult: Patient with history of myasthenia gravis with dyspnea on exertion, transient episode of ptosis at home yesterday 3/2 Referring Physician: Dr. Nori Riis  CC: Dyspnea on exertion  History is obtained from: Patient, chart review  HPI: Frances Medina is a 60 y.o. female with medical history significant for diverticulosis, gastroesophageal reflux disease, hypertension, myasthenia gravis with crisis in March of 2017 requiring intubation, on mycophenolate twice daily, pancreatitis, osteoarthritis of bilateral knees, migraine headaches, iron deficiency anemia, obstructive sleep apnea on CPAP, anxiety, mild restrictive lung disease on PFTs, and morbid obesity who presented to [facility] ED on 3/2 for evaluation of dyspnea on exertion. She states that for the last few days she has been becoming short of breath with any activity, even going to the restroom which is abnormal for her. She is normally able to walk 6,000 steps daily without dyspnea. Two nights ago, she laid down to go to sleep and felt she could not breathe despite using her CPAP machine. She remained dyspneic but on 3/1 when she walked to the restroom, she developed an intense left-frontal headache with left-eye twitching. When she looked into the mirror, she noted ptosis of her left eye prompting her to call her neurologist at [facility] who recommended she immediately seek treatment. She states the ptosis resolved in approximately 45 minutes. She denies sick contacts/exposure, fevers, chills, and diaphoresis at this time. She also denies extremity weakness, diplopia, or fatigue and endorses compliance with mycophenolate.   ROS: A 14 point ROS was performed and is negative except as noted in the HPI.  Past Medical History:  Diagnosis Date  . Acute respiratory failure ([place])   . Acute respiratory failure with hypoxemia ([place])   . Aspiration into airway   . Back pain 07/31/2017  . Benign neoplasm of rectum   . CAP  (community acquired pneumonia) 08/05/2015  . Depression with anxiety   . Diverticulosis   . Dysphagia 03/2015    EGD, Dr Carlean Purl. mild antral gastritis, ? due to Ibuprofen.  no stricture but empirically maloney dilated esophagus.   [name] Dysphagia, neurologic   . E. coli UTI 04/07/2015  . Elevated LDH 05/06/2015  . Encounter for routine gynecological examination 10/19/2015  . Endotracheally intubated   . Enteritis due to Clostridium difficile   . Esophageal dysmotility 11/08/2015  . Fibroid uterus    size of a dime  . Gastrostomy infection ([place]) 11/08/2015  . Gastrostomy infection ([place]) 11/08/2015  . GERD (gastroesophageal reflux disease)    hiatal hernia  . Hypertension    "went away when I stopped smoking"  . Knee injury   . Left hip pain 02/18/2017  . Migraine    "maybe couple times/month" (03/20/2015)  . Mild intermittent asthma 06/22/2017  . Myasthenia gravis ([place]) 2017  . Myasthenia gravis with acute exacerbation ([place]) 05/15/2015  . Osteoarthritis of left knee 12/02/2013  . Osteoarthritis of right knee 08/30/2013  . Pancreatitis 07/25/2017  . Protein-calorie malnutrition, severe ([place]) 08/07/2015  . Seasonal allergies    takes Zytrec  . Tracheostomy status ([place])   . Tubular adenoma of colon   . Tumors    "in my stomach"  . Umbilical hernia    watching , no plans for surgery at present  . Urine incontinence 10/19/2015  . VAP (ventilator-associated pneumonia) ([place])    Family History  Problem Relation Age of Onset  . Heart disease Mother 85  . Hypertension Mother   . Stroke Father   . Hypertension Father   .  Other Sister        Esophageal strictures  . Anemia Sister   . COPD Brother   . Leukemia Sister   . Leukemia Sister   . Colon cancer Neg Hx    Social History:   reports that she quit smoking about 7 years ago. Her smoking use included cigarettes. She started smoking about 46 years ago. She has a 57.00 pack-year smoking history. She has never used smokeless tobacco. She reports  that she does not drink alcohol and does not use drugs.  Medications  Current Facility-Administered Medications:  .  acetaminophen (TYLENOL) tablet 650 mg, 650 mg, Oral, Q6H PRN **OR** acetaminophen (TYLENOL) suppository 650 mg, 650 mg, Rectal, Q6H PRN, Beard, Samantha N, DO .  albuterol (VENTOLIN HFA) 108 (90 Base) MCG/ACT inhaler 2 puff, 2 puff, Inhalation, Q4H PRN, Beard, Samantha N, DO .  clonazePAM (KLONOPIN) tablet 0.5 mg, 0.5 mg, Oral, Daily PRN, Beard, Samantha N, DO .  docusate sodium (COLACE) capsule 100 mg, 100 mg, Oral, BID PRN, Beard, Samantha N, DO .  enoxaparin (LOVENOX) injection 40 mg, 40 mg, Subcutaneous, Q24H, Beard, Samantha N, DO .  ferrous sulfate tablet 325 mg, 325 mg, Oral, Daily, Beard, Samantha N, DO .  fluticasone (FLONASE) 50 MCG/ACT nasal spray 2 spray, 2 spray, Each Nare, Daily PRN, Beard, Samantha N, DO .  gabapentin (NEURONTIN) capsule 300 mg, 300 mg, Oral, TID PRN, Beard, Samantha N, DO .  montelukast (SINGULAIR) tablet 10 mg, 10 mg, Oral, QHS, Beard, Samantha N, DO .  mycophenolate (CELLCEPT) capsule 1,000 mg, 1,000 mg, Oral, BID, Beard, Samantha N, DO .  pantoprazole (PROTONIX) EC tablet 40 mg, 40 mg, Oral, Daily, Beard, Samantha N, DO .  sertraline (ZOLOFT) tablet 50 mg, 50 mg, Oral, Daily PRN, Beard, Samantha N, DO .  vitamin B-12 (CYANOCOBALAMIN) tablet 100 mcg, 100 mcg, Oral, Daily, Beard, Samantha N, DO  Current Outpatient Medications:  .  acetaminophen (TYLENOL) 500 MG tablet, Take 500 mg by mouth in the morning and at bedtime., Disp: , Rfl:  .  albuterol (PROVENTIL) (2.5 MG/3ML) 0.083% nebulizer solution, USE 1 VIAL IN NEBULIZER EVERY 6 HOURS AS NEEDED FOR SHORTNESS OF BREATH AND WHEEZING, Disp: 150 mL, Rfl: 0 .  albuterol (VENTOLIN HFA) 108 (90 Base) MCG/ACT inhaler, INHALE 2 PUFFS INTO THE LUNGS EVERY 6 HOURS AS NEEDED FOR WHEEZE OR SHORTNESS OF BREATH (Patient taking differently: Inhale into the lungs every 6 (six) hours as needed for shortness of  breath or wheezing.), Disp: 18 g, Rfl: 3 .  APPLE CIDER VINEGAR PO, Take 1 capsule by mouth daily., Disp: , Rfl:  .  Ascorbic Acid (VITAMIN C PO), Take 1,000 mg by mouth daily., Disp: , Rfl:  .  cetirizine (ZYRTEC) 10 MG tablet, TAKE 1 TABLET (10 MG TOTAL) BY MOUTH DAILY. NO THERAPEUTIC SUBSTITUTION (Patient taking differently: Take 10 mg by mouth daily.), Disp: 90 tablet, Rfl: 2 .  clonazePAM (KLONOPIN) 0.5 MG tablet, TAKE 1 TABLET BY MOUTH TWICE A DAY AS NEEDED FOR ANXIETY (Patient taking differently: Take by mouth 2 (two) times daily as needed for anxiety.), Disp: 60 tablet, Rfl: 1 .  Cyanocobalamin (VITAMIN B12 PO), Take 1 tablet by mouth daily., Disp: , Rfl:  .  docusate sodium (COLACE) 100 MG capsule, Take 1 capsule (100 mg total) by mouth 2 (two) times daily as needed for mild constipation or moderate constipation., Disp: 10 capsule, Rfl: 0 .  ferrous sulfate 325 (65 FE) MG tablet, Take 1 tablet (325 mg  total) by mouth daily., Disp: 90 tablet, Rfl: 3 .  fluticasone (FLONASE) 50 MCG/ACT nasal spray, Place 2 sprays into both nostrils daily. (Patient taking differently: Place 2 sprays into both nostrils daily as needed for allergies.), Disp: 16 g, Rfl: 6 .  gabapentin (NEURONTIN) 300 MG capsule, Take 300 mg by mouth 3 (three) times daily as needed (pain)., Disp: , Rfl:  .  guaiFENesin (ROBITUSSIN) 100 MG/5ML SOLN, Take 15 mLs by mouth daily as needed for cough or to loosen phlegm., Disp: , Rfl:  .  Menthol 10 MG LOZG, Take 1 lozenge by mouth daily as needed (cough)., Disp: , Rfl:  .  montelukast (SINGULAIR) 10 MG tablet, TAKE 1 TABLET BY MOUTH EVERYDAY AT BEDTIME (Patient taking differently: Take 10 mg by mouth at bedtime.), Disp: 90 tablet, Rfl: 3 .  mycophenolate (CELLCEPT) 500 MG tablet, Take 1,000 mg by mouth 2 (two) times daily., Disp: , Rfl:  .  omeprazole (PRILOSEC) 20 MG capsule, Take 1 capsule (20 mg total) by mouth daily., Disp: 90 capsule, Rfl: 3 .  oxyCODONE (OXY IR/ROXICODONE) 5 MG  immediate release tablet, Take 5 mg by mouth every 6 (six) hours as needed for moderate pain., Disp: , Rfl:  .  sertraline (ZOLOFT) 50 MG tablet, TAKE 3 TABLETS BY MOUTH EVERY DAY (Patient taking differently: Take 50 mg by mouth 3 (three) times daily.), Disp: 270 tablet, Rfl: 2 .  VITAMIN D, CHOLECALCIFEROL, PO, Take 1 tablet by mouth daily. otc, Disp: , Rfl:  .  Vitamin D, Ergocalciferol, (DRISDOL) 1.25 MG (50000 UNIT) CAPS capsule, Take 50,000 Units by mouth once a week., Disp: , Rfl:  .  carbamide peroxide (DEBROX) 6.5 % OTIC solution, Place 5 drops into the left ear 2 (two) times daily. (Patient not taking: Reported on 05/03/2020), Disp: 15 mL, Rfl: 0  Exam: Current vital signs: BP 102/73   Pulse 73   Temp 98 F (36.7 C) (Oral)   Resp 17   SpO2 98%  Vital signs in last 24 hours: Temp:  [98 F (36.7 C)-98.1 F (36.7 C)] 98 F (36.7 C) (03/03 0445) Pulse Rate:  [61-107] 73 (03/03 1130) Resp:  [9-38] 17 (03/03 1130) BP: (77-167)/(53-113) 102/73 (03/03 1130) SpO2:  [92 %-100 %] 98 % (03/03 1130)  GENERAL: Awake, alert in NAD, sitting up in bed Head: Normocephalic and atraumatic, without obvious abnormality EENT: No OP obstruction, normal conjunctiva, wears eyeglasses  LUNGS - Normal respiratory effort. Non-labored breathing on assessment CV - RRR on tele ABDOMEN - Soft, nontender Ext: warm, well perfused  NEURO:  Mental Status: alert and oriented to self, place, year, age, and situation. She is able to give a clear and coherent history of present illness. Speech is clear without dysarthria.  No evidence of aphasia or neglect. Cranial Nerves:  II: PERRL 4 mm/brisk. Visual fields full.  III, IV, VI: EOMI. Subtle left eye ptosis with eye strain noted on examination.  V: Sensation is intact to light touch and symmetrical to face.  VII: Face is symmetric resting and smiling. Able to puff cheeks and raise eyebrows.  VIII: Hearing intact to voice IX, X: Palate elevation is symmetric.  Phonation normal.  XI: Normal sternocleidomastoid and trapezius muscle strength XII: Tongue protrudes midline. Motor: 5/5 strength is all muscle groups. Left lower extremity 5/5 with some pain limitation of movement due to history of bursitis. Tone is normal. Bulk is normal.  Sensation: Intact to light touch bilaterally in all four extremities. Extinction intact. 2 point discrimination.  Coordination: FTN intact bilaterally. No pronator drift.   DTRs: 2+ and symmetric in the biceps.  Gait- deferred  Labs I have reviewed labs in epic and the results pertinent to this consultation are:  CBC Latest Ref Rng & Units 05/02/2020 05/02/2019 08/06/2018  WBC 4.0 - 10.5 K/uL 8.4 8.1 9.0  Hemoglobin 12.0 - 15.0 g/dL 11.6(L) 11.9 11.2  Hematocrit 36.0 - 46.0 % 38.4 37.6 35.6  Platelets 150 - 400 K/uL 151 248 258   CMP Latest Ref Rng & Units 05/02/2020 08/26/2019 05/02/2019  Glucose 70 - 99 mg/dL 105(H) 86 92  BUN 6 - 20 mg/dL 7 11 9   Creatinine 0.44 - 1.00 mg/dL 0.59 0.71 0.68  Sodium 135 - 145 mmol/L 136 141 142  Potassium 3.5 - 5.1 mmol/L 3.3(L) 4.3 4.2  Chloride 98 - 111 mmol/L 103 104 104  CO2 22 - 32 mmol/L 24 24 21   Calcium 8.9 - 10.3 mg/dL 8.9 9.2 9.3  Total Protein 6.5 - 8.1 g/dL 6.7 6.8 6.9  Total Bilirubin 0.3 - 1.2 mg/dL 0.5 0.2 0.3  Alkaline Phos 38 - 126 U/L 75 129(H) 122(H)  AST 15 - 41 U/L 18 14 17   ALT 0 - 44 U/L 14 10 12    BNP    Component Value Date/Time   BNP 198.9 (H) 05/02/2020 2319   Assessment: 60 year old female with history as above who presented 3/1 for concerns of acute onset dyspnea on exertion with one episode of left frontal headache with left-eye twitching and ptosis that lasted approximately 45 minutes before resolution.  - Diagnosed with myasthenia crisis in March of 2017 with initial myasthenia presentation requiring ICU admission and intubation. Presentation at that time significant for proptosis, dyspnea, and diplopia.  - On assessment, patient endorses improvement  in shortness of breath today but attributes the improvement to rest without movement. States when she gets up to go to the restroom, she becomes dyspneic. With eye fatigue on assessment, there is a mild degree of ptosis noted on the left eye without residual eye twitching or reports of diplopia. - Initial work-up reveals BNP elevated at 198.9, hemoglobin at 11.6 with baseline hemoglobin 10-11, CXR without acute abnormality, patient afebrile without leukocytosis, CT angio chest with evidence of lower lobe atelectasis, and a UA with nitrites, trace leukocytes, and rare bacteria. PFTs reviewed from 2020 with evidence of possible restrictive/ near normal lung function. (November 2017, nif -60 x 3 efforts, VC 1.2 L x 2 efforts)   Recommendations: - Negative inspiratory force and vital capacity measurements - Telemetry monitoring  - Continue BID mycophenolate  - If treating abnormal UA due to immunosuppressed status: Antibiotics to avoid include:   - Aminoglycosides: e.g., streptomycin, tobramycin, kanamycin   - Quinolones: e.g., ciprofloxacin, levofloxacin, ofloxacin, gatifloxacin   - Macrolides: e.g., erythromycin, azithromycin, telithromycin   Pt seen by NP/Neuro and later by MD. Note/plan to be edited by MD as needed.  Anibal Henderson, AGAC-NP [facility] Pager: [phone]  I have seen the patient reviewed the above note.  She states that she feels this is different than her myasthenia, and she feels different than she felt back in 2017.  She has baseline left-sided ptosis which she states is always present.  With sustained upgaze it might worsen very minimally, but not significantly.  She has no diplopia with sustained upgaze, no difficulty swallowing.  She has good palatal elevation, 5/5 neck flexion, able to count greater than 20 single breath (started coughing which stopped her).  At this  time, this seems unusual for a myasthenic exacerbation.  I would continue working up  alternative causes, but we could consider adding Mestinon if no alternative is found.  I would check serial NIF/VC.  Neurology will continue to follow.  Roland Rack, MD [facility] [phone]  If 7pm- 7am, please page neurology on call as listed in [place].

## 2020-05-03 NOTE — ED Notes (Signed)
Pt ambulated around the room on room air. Pt sp02 95%... Pt reports slight sob after ambulation.

## 2020-05-03 NOTE — Progress Notes (Signed)
[facility] imaging downtime prelim  CTA chest was read from the scanner.    There is limitation from habitus and streak.  No convincing PE.    Mild atelectasis.  JWatts MD

## 2020-05-03 NOTE — Progress Notes (Signed)
  Echocardiogram 2D Echocardiogram has been performed.  Luisa Hart  RDCS 05/03/2020, 3:03 PM

## 2020-05-03 NOTE — ED Provider Notes (Signed)
60 yo female morbidly obese, ho myasthenia gravis, presents with dyspnea Physical exam without neuro changed by Watts doe. But no sat drop here with ambulation. OK at rest Physical Exam  BP (!) 132/93   Pulse 61   Temp 98 F (36.7 C) (Oral)   Resp (!) 9   SpO2 96%   Physical Exam  ED Course/Procedures    BNP 200- ?mild chf CTA pending Procedures  MDM    Plan  Result cta Reassess Consider d/c  60 year old female history of myasthenia gravis, morbid obesity presents today with dyspnea.  She reports to me that she had an episode of diplopia last night but is not having any currently. Of note, Dr. Betsey Holiday did speak with neurology during the night last night. Patient continues to feel dyspneic with any exertion. CT angiogram is negative for PE Patient had some diuresis here in the ED but does not feel any clinical change in her dyspnea with exertion. Plan to consult family practice for further evaluation and management. They will see for evaluation   Pattricia Boss, MD 05/03/20 1009

## 2020-05-03 NOTE — Therapy (Signed)
Nif -40

## 2020-05-04 DIAGNOSIS — R0602 Shortness of breath: Secondary | ICD-10-CM | POA: Diagnosis not present

## 2020-05-04 DIAGNOSIS — E876 Hypokalemia: Secondary | ICD-10-CM | POA: Diagnosis present

## 2020-05-04 DIAGNOSIS — Z888 Allergy status to other drugs, medicaments and biological substances status: Secondary | ICD-10-CM | POA: Diagnosis not present

## 2020-05-04 DIAGNOSIS — G7000 Myasthenia gravis without (acute) exacerbation: Secondary | ICD-10-CM | POA: Diagnosis present

## 2020-05-04 DIAGNOSIS — N390 Urinary tract infection, site not specified: Secondary | ICD-10-CM | POA: Diagnosis present

## 2020-05-04 DIAGNOSIS — R0609 Other forms of dyspnea: Secondary | ICD-10-CM | POA: Diagnosis present

## 2020-05-04 DIAGNOSIS — B9620 Unspecified Escherichia coli [E. coli] as the cause of diseases classified elsewhere: Secondary | ICD-10-CM | POA: Diagnosis present

## 2020-05-04 DIAGNOSIS — J984 Other disorders of lung: Secondary | ICD-10-CM | POA: Diagnosis present

## 2020-05-04 DIAGNOSIS — Z86010 Personal history of colonic polyps: Secondary | ICD-10-CM | POA: Diagnosis not present

## 2020-05-04 DIAGNOSIS — G43909 Migraine, unspecified, not intractable, without status migrainosus: Secondary | ICD-10-CM | POA: Diagnosis present

## 2020-05-04 DIAGNOSIS — Z823 Family history of stroke: Secondary | ICD-10-CM | POA: Diagnosis not present

## 2020-05-04 DIAGNOSIS — Z87891 Personal history of nicotine dependence: Secondary | ICD-10-CM | POA: Diagnosis not present

## 2020-05-04 DIAGNOSIS — F411 Generalized anxiety disorder: Secondary | ICD-10-CM | POA: Diagnosis present

## 2020-05-04 DIAGNOSIS — Z825 Family history of asthma and other chronic lower respiratory diseases: Secondary | ICD-10-CM | POA: Diagnosis not present

## 2020-05-04 DIAGNOSIS — D509 Iron deficiency anemia, unspecified: Secondary | ICD-10-CM | POA: Diagnosis present

## 2020-05-04 DIAGNOSIS — Z91013 Allergy to seafood: Secondary | ICD-10-CM | POA: Diagnosis not present

## 2020-05-04 DIAGNOSIS — Z20822 Contact with and (suspected) exposure to covid-19: Secondary | ICD-10-CM | POA: Diagnosis present

## 2020-05-04 DIAGNOSIS — R0600 Dyspnea, unspecified: Secondary | ICD-10-CM | POA: Diagnosis not present

## 2020-05-04 DIAGNOSIS — K219 Gastro-esophageal reflux disease without esophagitis: Secondary | ICD-10-CM | POA: Diagnosis present

## 2020-05-04 DIAGNOSIS — M170 Bilateral primary osteoarthritis of knee: Secondary | ICD-10-CM | POA: Diagnosis present

## 2020-05-04 DIAGNOSIS — I10 Essential (primary) hypertension: Secondary | ICD-10-CM | POA: Diagnosis present

## 2020-05-04 DIAGNOSIS — Z885 Allergy status to narcotic agent status: Secondary | ICD-10-CM | POA: Diagnosis not present

## 2020-05-04 DIAGNOSIS — Z6841 Body Mass Index (BMI) 40.0 and over, adult: Secondary | ICD-10-CM | POA: Diagnosis not present

## 2020-05-04 DIAGNOSIS — Z8249 Family history of ischemic heart disease and other diseases of the circulatory system: Secondary | ICD-10-CM | POA: Diagnosis not present

## 2020-05-04 DIAGNOSIS — G4733 Obstructive sleep apnea (adult) (pediatric): Secondary | ICD-10-CM | POA: Diagnosis present

## 2020-05-04 LAB — HEMOGLOBIN A1C
Hgb A1c MFr Bld: 6.1 % — ABNORMAL HIGH (ref 4.8–5.6)
Mean Plasma Glucose: 128.37 mg/dL

## 2020-05-04 LAB — CBC
HCT: 37.7 % (ref 36.0–46.0)
Hemoglobin: 11.7 g/dL — ABNORMAL LOW (ref 12.0–15.0)
MCH: 22.1 pg — ABNORMAL LOW (ref 26.0–34.0)
MCHC: 31 g/dL (ref 30.0–36.0)
MCV: 71.1 fL — ABNORMAL LOW (ref 80.0–100.0)
Platelets: 140 10*3/uL — ABNORMAL LOW (ref 150–400)
RBC: 5.3 MIL/uL — ABNORMAL HIGH (ref 3.87–5.11)
RDW: 19.7 % — ABNORMAL HIGH (ref 11.5–15.5)
WBC: 7.1 10*3/uL (ref 4.0–10.5)
nRBC: 0 % (ref 0.0–0.2)

## 2020-05-04 LAB — BASIC METABOLIC PANEL
Anion gap: 10 (ref 5–15)
BUN: 8 mg/dL (ref 6–20)
CO2: 26 mmol/L (ref 22–32)
Calcium: 9.2 mg/dL (ref 8.9–10.3)
Chloride: 103 mmol/L (ref 98–111)
Creatinine, Ser: 0.64 mg/dL (ref 0.44–1.00)
GFR, Estimated: >60 mL/min (ref >60)
Glucose, Bld: 111 mg/dL — ABNORMAL HIGH (ref 70–99)
Potassium: 3.3 mmol/L — ABNORMAL LOW (ref 3.5–5.1)
Sodium: 139 mmol/L (ref 135–145)

## 2020-05-04 LAB — HIV ANTIBODY (ROUTINE TESTING W REFLEX): HIV Screen 4th Generation wRfx: NONREACTIVE

## 2020-05-04 MED ORDER — PYRIDOSTIGMINE BROMIDE 60 MG PO TABS
30.0000 mg | ORAL_TABLET | Freq: Three times a day (TID) | ORAL | Status: DC
  Administered 2020-05-04 – 2020-05-05 (×3): 30 mg via ORAL
  Filled 2020-05-04 (×4): qty 0.5

## 2020-05-04 MED ORDER — POTASSIUM CHLORIDE 20 MEQ PO PACK
40.0000 meq | PACK | Freq: Once | ORAL | Status: AC
  Administered 2020-05-04: 40 meq via ORAL
  Filled 2020-05-04: qty 2

## 2020-05-04 MED ORDER — ENOXAPARIN SODIUM 60 MG/0.6ML ~~LOC~~ SOLN
60.0000 mg | SUBCUTANEOUS | Status: DC
  Administered 2020-05-04 – 2020-05-05 (×2): 60 mg via SUBCUTANEOUS
  Filled 2020-05-04 (×2): qty 0.6

## 2020-05-04 MED ORDER — CEPHALEXIN 250 MG PO CAPS
500.0000 mg | ORAL_CAPSULE | Freq: Two times a day (BID) | ORAL | Status: DC
  Administered 2020-05-04 – 2020-05-05 (×3): 500 mg via ORAL
  Filled 2020-05-04 (×3): qty 2

## 2020-05-04 MED ORDER — SERTRALINE HCL 100 MG PO TABS
100.0000 mg | ORAL_TABLET | Freq: Every day | ORAL | Status: DC
  Administered 2020-05-04 – 2020-05-05 (×2): 100 mg via ORAL
  Filled 2020-05-04 (×2): qty 1

## 2020-05-04 NOTE — Progress Notes (Addendum)
Family Medicine Teaching Service Daily Progress Note Intern Pager: [phone]  Patient name: Frances Medina Medical record number: [id number] Date of birth: 1960-12-20 Age: 60 y.o. Gender: female  Primary Care Provider: [name], MD Consultants: Neurology Code Status: Full  Pt Overview and Major Events to Date:  3/3 - Admitted  Assessment and Plan: [name] is a 60 y.o. female presenting with acute dyspnea on exertion. PMH is significant for well-controlled myasthenia gravis on mycophenolate, osteoarthritis, morbid obesity, GERD, migraine headaches, iron deficiency anemia, OSA on CPAP, mild restrictive lung disease evident on PFTs, and anxiety.  Acute dyspnea on exertion without hypoxia: Stable. Echocardiogram: LVEF 60-65%, moderate LVH. NIF -40 and patient on room air. Patient has been seen by me only during rest, as patient was admitted for concern for worsening dyspnea on exertion it is important that patient is evaluated by physical therapy for safe ambulatory evaluation. At this time we do not have high suspicion for cardiac source, but this presenting symptom if persistent or worsening would be concerning then we will proceed with consultation to cardiology.  - Neuro following, appreciate recs - Telemetry and pulse ox - Albuterol PRN - Daily CMP, CBC  UTI with gram negative rods: Urine culture  > 100,000 colonies, reactivated for better growth with susceptibilities to follow. Appears to be a simple UTI due to presence of nitrites and leukocytes on UA on admission.  - Starting keflex 500mg  BID x7 days  Hypokalemia: Acute. K 3.3 > 76mEq > 3.3 > repleted with 57mEq - F/u BMP in pm  Acute left-sided headache, in setting of chronic migraines: Improving. - Tylenol PRN  Myasthenia gravis: Chronic. - Continue mycophenolate BID  OSA on CPAP: Chronic, stable - Offer CPAP QHS  Iron deficiency anemia: Chronic, stable. - Monitor CBC - Continue home ferrous  sulfate  Generalized anxiety: Chronic, stable. Patient reports that she has been taking her Zoloft once daily, but on days she does forget she ends up doubling of the dose. - Scheduling Zoloft is 100 mg daily  - Continue home Klonopin - Counsel patient on appropriate daily dosing of Zoloft  Restrictive lung disease: - Albuterol PRN  Prediabetes: Chronic, stable A1c 6.1 - Patient working towards weight loss  FEN/GI: Heart healthy PPx: Lovenox   Status is: Observation  The patient remains OBS appropriate and will d/c before 2 midnights.  Dispo: The patient is from: Home              Anticipated d/c is to: Home              Patient currently is not medically stable to d/c.   Difficult to place patient No    Subjective:  Patient reports that she is feeling much better today. She states that neurology mentioned possibly restarting her previous medication of m  Objective: Temp:  [97.8 F (36.6 C)-98.3 F (36.8 C)] 98.3 F (36.8 C) (03/04 0742) Pulse Rate:  [67-85] 84 (03/04 0742) Resp:  [14-19] 14 (03/04 0742) BP: (102-133)/(56-87) 113/69 (03/04 0742) SpO2:  [9 %-99 %] 95 % (03/04 0742) Weight:  [139.9 kg-140.1 kg] 140.1 kg (03/04 0432) Physical Exam: General: NAD, supine in bed, very pleasant Cardiovascular: distant heart sounds, RRR, no murmur appreciated Respiratory: normal WOB, comfortable on room air, able to speak in full sentences, CTAB Abdomen: soft, non-distended, non-tender, bowel sounds present Neuro: A&Ox3, CN II-XII grossly intact with no ptosis on exam today. 5/5 strenth in BLE and BUE.  Laboratory: Recent Labs  Lab  05/02/20 2319 05/04/20 0420  WBC 8.4 7.1  HGB 11.6* 11.7*  HCT 38.4 37.7  PLT 151 140*   Recent Labs  Lab 05/02/20 2319 05/04/20 0420  NA 136 139  K 3.3* 3.3*  CL 103 103  CO2 24 26  BUN 7 8  CREATININE 0.59 0.64  CALCIUM 8.9 9.2  PROT 6.7  --   BILITOT 0.5  --   ALKPHOS 75  --   ALT 14  --   AST 18  --   GLUCOSE 105*  111*   Urine culture: > 100,000 colonies.  Incubated for growth.  Susceptibilities to follow  Imaging/Diagnostic Tests: ECHOCARDIOGRAM COMPLETE  Result Date: 05/03/2020    ECHOCARDIOGRAM REPORT   Patient Name:   Frances Medina Date of Exam: 05/03/2020 Medical Rec #:  [id number]      Height:       60.0 in Accession #:    [id number]     Weight:       320.0 lb Date of Birth:  04/20/1960      BSA:          2.279 m Patient Age:    42 years       BP:           102/73 mmHg Patient Gender: F              HR:           65 bpm. Exam Location:  Inpatient Procedure: 2D Echo, Cardiac Doppler and Color Doppler Indications:    Shortness of breath  History:        Patient has prior history of Echocardiogram examinations, most                 recent 08/23/2018. Signs/Symptoms:Shortness of Breath; Risk                 Factors:Hypertension and Diabetes.  Sonographer:    Luisa Hart RDCS Referring Phys: [place]  1. Left ventricular ejection fraction, by estimation, is 60 to 65%. The left ventricle has normal function. The left ventricle has no regional wall motion abnormalities. There is moderate concentric left ventricular hypertrophy. Left ventricular diastolic parameters were normal.  2. Right ventricular systolic function is normal. The right ventricular size is normal. There is normal pulmonary artery systolic pressure.  3. The mitral valve is grossly normal. Trivial mitral valve regurgitation.  4. The aortic valve is tricuspid. There is mild calcification of the aortic valve. There is mild thickening of the aortic valve. Aortic valve regurgitation is not visualized. Mild aortic valve sclerosis is present, with no evidence of aortic valve stenosis. Comparison(s): No significant change from prior study. FINDINGS  Left Ventricle: Left ventricular ejection fraction, by estimation, is 60 to 65%. The left ventricle has normal function. The left ventricle has no regional wall motion abnormalities. The left  ventricular internal cavity size was normal in size. There is  moderate concentric left ventricular hypertrophy. Left ventricular diastolic parameters were normal. Right Ventricle: The right ventricular size is normal. Right vetricular wall thickness was not well visualized. Right ventricular systolic function is normal. There is normal pulmonary artery systolic pressure. The tricuspid regurgitant velocity is 2.35 m/s, and with an assumed right atrial pressure of 3 mmHg, the estimated right ventricular systolic pressure is 82.8 mmHg. Left Atrium: Left atrial size was normal in size. Right Atrium: Right atrial size was normal in size. Pericardium: There is no evidence of pericardial effusion. Mitral Valve:  The mitral valve is grossly normal. Mild mitral annular calcification. Trivial mitral valve regurgitation. Tricuspid Valve: The tricuspid valve is normal in structure. Tricuspid valve regurgitation is trivial. Aortic Valve: The aortic valve is tricuspid. There is mild calcification of the aortic valve. There is mild thickening of the aortic valve. Aortic valve regurgitation is not visualized. Mild aortic valve sclerosis is present, with no evidence of aortic valve stenosis. Aortic valve mean gradient measures 5.0 mmHg. Aortic valve peak gradient measures 11.4 mmHg. Aortic valve area, by VTI measures 2.18 cm. Pulmonic Valve: The pulmonic valve was not well visualized. Pulmonic valve regurgitation is trivial. Aorta: The aortic root and ascending aorta are structurally normal, with no evidence of dilitation. IAS/Shunts: No atrial level shunt detected by color flow Doppler.  LEFT VENTRICLE PLAX 2D LVIDd:         3.80 cm     Diastology LVIDs:         2.40 cm     LV e' lateral:   0.07 cm/s LV PW:         1.30 cm     LV E/e' lateral: 7.2 LV IVS:        1.30 cm LVOT diam:     1.80 cm LV SV:         71 LV SV Index:   31 LVOT Area:     2.54 cm  LV Volumes (MOD) LV vol d, MOD A2C: 39.8 ml LV vol d, MOD A4C: 56.1 ml LV vol  s, MOD A2C: 14.8 ml LV vol s, MOD A4C: 16.6 ml LV SV MOD A2C:     25.0 ml LV SV MOD A4C:     56.1 ml LV SV MOD BP:      31.7 ml RIGHT VENTRICLE RV S prime:     16.10 cm/s TAPSE (M-mode): 1.5 cm LEFT ATRIUM           Index       RIGHT ATRIUM           Index LA diam:      2.80 cm 1.23 cm/m  RA Area:     22.20 cm LA Vol (A4C): 52.4 ml 22.99 ml/m RA Volume:   73.10 ml  32.07 ml/m  AORTIC VALVE                    PULMONIC VALVE AV Area (Vmax):    2.08 cm     PV Vmax:       0.75 m/s AV Area (Vmean):   2.13 cm     PV Vmean:      81.400 cm/s AV Area (VTI):     2.18 cm     PV VTI:        0.266 m AV Vmax:           169.00 cm/s  PV Peak grad:  2.2 mmHg AV Vmean:          107.000 cm/s PV Mean grad:  3.0 mmHg AV VTI:            0.324 m AV Peak Grad:      11.4 mmHg AV Mean Grad:      5.0 mmHg LVOT Vmax:         138.00 cm/s LVOT Vmean:        89.600 cm/s LVOT VTI:          0.278 m LVOT/AV VTI ratio: 0.86  AORTA Ao Root diam: 2.90 cm Ao Asc diam:  2.70 cm MITRAL VALVE               TRICUSPID VALVE MV Area (PHT): 2.91 cm    TR Peak grad:   22.1 mmHg MV Decel Time: 261 msec    TR Vmax:        235.00 cm/s MV E velocity: 0.51 cm/s MV A velocity: 85.70 cm/s  SHUNTS MV E/A ratio:  0.01        Systemic VTI:  0.28 m                            Systemic Diam: 1.80 cm Gwyndolyn Kaufman MD Electronically signed by Gwyndolyn Kaufman MD Signature Date/Time: 05/03/2020/4:16:11 PM    Final      Rise Patience, DO 05/04/2020, 11:15 AM PGY-1, [place] Intern pager: [phone], text pages welcome

## 2020-05-04 NOTE — Plan of Care (Signed)
  Problem: Education: Goal: Knowledge of General Education information will improve Description Including pain rating scale, medication(s)/side effects and non-pharmacologic comfort measures Outcome: Progressing   

## 2020-05-04 NOTE — Hospital Course (Addendum)
Frances Medina is a 60 y.o. female presenting with acute dyspnea on exertion. PMH is significant for well-controlled myasthenia gravis on mycophenolate, osteoarthritis, morbid obesity, GERD, migraine headaches, iron deficiency anemia, OSA on CPAP, mild restrictive lung disease evident on PFTs, and anxiety.  Acute dyspnea on exertion without hypoxia: Stable. Myasthenia gravis Patient admitted with dyspnea on exertion, initially concerning for myasthenic crisis/flare as patient was also noted to have ptosis present on exam. Neurology was consulted and there was low suspicion for myasthenic crisis. NIFs were obtained and patient remained at -40. Patient was started on Mestinon with reported improvement in symptoms. Minimal concern for possible cardiac origin as troponin was negative and echocardiogram was obtained and showed EF 60-65% with moderate concentric LVH and normal diastolic parameters. Patient worked with physical therapy and was able to ambulate a flight of stairs with no continued symptoms.  E. Coli UTI: Patient found to have a UA with nitrites and leukocytes, culture collected and notable for E. Coli >100,000 CFU. Patient was started on Keflex on 3/4 and discharged with the medication to continue for a total of 7 antibiotic days.   Hypokalemia Patient had hypokalemia, which was repleted in the hospital and remained stable throughout admission.   Issues for follow-up: Recommend outpatient follow-up to monitor resolution of UTI symptoms. Patient restarted on prior medication of Mestinon by neurology in the hospital as ptosis was noted and there was no other identifiable cause of worsening dyspnea and ptosis.

## 2020-05-04 NOTE — Progress Notes (Signed)
Patient had documented hives with penicillin use >10 years ago. In 2019 she receive at least 4 days of augmentin, meeting criteria for tolerating an oral challenge of a penicillin. Based on my chart review and interviewing the patient, I removed the penicillin allergy in the patient's chart.  Norina Buzzard, PharmD PGY1 Pharmacy Resident 05/04/2020 2:36 PM

## 2020-05-04 NOTE — Plan of Care (Signed)

## 2020-05-04 NOTE — Progress Notes (Signed)
NIF -40 

## 2020-05-04 NOTE — Progress Notes (Signed)
Subjective: Feels significantly less SOB today. All NIFs have been > -40(our meters only measure up to -40)  Exam: Vitals:   05/04/20 0742 05/04/20 1244  BP: 113/69 120/70  Pulse: 84 89  Resp: 14 20  Temp: 98.3 F (36.8 C) 98.3 F (36.8 C)  SpO2: 95% 96%   Gen: In bed, NAD Resp: non-labored breathing, no acute distress She is able to count to 21 on a single breath Abd: soft, nt  Neuro: MS: Awake, alert, interactive and appropriate CN: EOMI, no diplopia, she does have mild left ptosis which very slightly worsens with sustained upgaze Motor: 5/5  neck flexion Sensory: Intact light touch  Pertinent Labs: BMP-unremarkable  Impression: 60 year old female with myasthenia gravis who presents with shortness of breath without other signs of myasthenic crisis or exacerbation.  She does have a urinary tract infection which can worsen myasthenic symptoms,?  Whether this could be playing a role.  Given that she does have some ptosis, I do think that starting Mestinon would be reasonable, but with her symptomatically improving I would favor treating her underlying UTI.  I would not favor aggressive immune suppression with either steroids or IVIG at this time.  Recommendations: 1) Mestinon 30 mg 3 times daily 2) neurology will follow  Roland Rack, MD [facility] [phone]  If 7pm- 7am, please page neurology on call as listed in [place].

## 2020-05-04 NOTE — Therapy (Signed)
NIF -40x3 

## 2020-05-04 NOTE — Evaluation (Signed)
Occupational Therapy Evaluation/Discharge  Patient Details Name: Frances Medina MRN: [id number] DOB: [birth year] Today's Date: 05/04/2020    History of Present Illness Frances Medina is a 60 y.o. female presenting with acute dyspnea on exertion. PMH is significant for well-controlled myasthenia gravis on mycophenolate, osteoarthritis, morbid obesity, GERD, migraine headaches, iron deficiency anemia, OSA on CPAP, mild restrictive lung disease evident on PFTs, and anxiety.   Clinical Impression   PTA, pt lives with family and reports Independence in all daily tasks without typical use of AD. Pt reports exercising daily via flight of steps at home, motivated to lose weight and follow healthy lifestyle. Pt reports improvement of symptoms since admission, able to demo laps in room Independently with 1/4 DOE and stable vitals. Pt reports if activity was prolonged, pt may experience chest heaviness as she did initially in ED with minimal activity. Encouraged energy conservation strategies, breathing techniques and gradual increase in activity tolerance. Strength of B UE 5/5, symmetrical and vision WFL. Pt overall Independent with ADLs and no further skilled OT services indicated at acute level. OT to sign off. Please reconsult if needs change.     Follow Up Recommendations  No OT follow up    Equipment Recommendations  None recommended by OT    Recommendations for Other Services       Precautions / Restrictions Precautions Precautions: None Precaution Comments: low fall Restrictions Weight Bearing Restrictions: No      Mobility Bed Mobility               General bed mobility comments: in chair on entry    Transfers Overall transfer level: Independent Equipment used: None             General transfer comment: Independent for sit to stand from recliner and short mobility laps in room without AD    Balance Overall balance assessment: No apparent balance deficits (not formally  assessed)                                         ADL either performed or assessed with clinical judgement   ADL Overall ADL's : Independent                                       General ADL Comments: Has been mobilizing to bathroom independently without use of AD. Able to demo laps in room without AD, denies SOB with this light activity though may experience chest heaviness with prolonged activity. VSS on RA     Vision Baseline Vision/History: Wears glasses Wears Glasses: At all times Patient Visual Report: Blurring of vision Vision Assessment?: Vision impaired- to be further tested in functional context Additional Comments: hx of L eye ptosis (mild). reports blurring of L eye that improves with glasses wear. able to track movement in all planes without eye jumping though pt reports L eye jumping when this episode started     Perception     Praxis      Pertinent Vitals/Pain Pain Assessment: No/denies pain     Hand Dominance Right   Extremity/Trunk Assessment Upper Extremity Assessment Upper Extremity Assessment: Overall WFL for tasks assessed   Lower Extremity Assessment Lower Extremity Assessment: Defer to PT evaluation   Cervical / Trunk Assessment Cervical / Trunk Assessment: Normal   Communication Communication  Communication: No difficulties   Cognition Arousal/Alertness: Awake/alert Behavior During Therapy: WFL for tasks assessed/performed Overall Cognitive Status: Within Functional Limits for tasks assessed                                     General Comments  O2 96% and above on RA, HR up to 121bpm with activity 1/4 DOE with short laps of mobility.    Exercises     Shoulder Instructions      Home Living Family/patient expects to be discharged to:: Private residence Living Arrangements: Spouse/significant other;Children;Other (Comment) (mother in law) Available Help at Discharge: Family;Available  PRN/intermittently Type of Home: House       Home Layout: Two level Alternate Level Stairs-Number of Steps: 14             Home Equipment: Cane - single point   Additional Comments: 3 steps into garage apartment for mother in law      Prior Functioning/Environment Level of Independence: Independent        Comments: Does not typically use cane inside home, may use it outside in community. 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 Pt was exercising daily, up/down flight of steps in home        OT Problem List:        OT Treatment/Interventions:      OT Goals(Current goals can be found in the care plan section) Acute Rehab OT Goals Patient Stated Goal: figure out what caused this episode, return to normal activities, normalize liver size OT Goal Formulation: All assessment and education complete, DC therapy  OT Frequency:     Barriers to D/C:            Co-evaluation              AM-PAC OT "6 Clicks" Daily Activity     Outcome Measure Help from another person eating meals?: None Help from another person taking care of personal grooming?: None Help from another person toileting, which includes using toliet, bedpan, or urinal?: None Help from another person bathing (including washing, rinsing, drying)?: None Help from another person to put on and taking off regular upper body clothing?: None Help from another person to put on and taking off regular lower body clothing?: None 6 Click Score: 24   End of Session Nurse Communication: Mobility status  Activity Tolerance: Patient tolerated treatment well Patient left: in chair;with call bell/phone within reach;with nursing/sitter in room  OT Visit Diagnosis: Other (comment) (decreased cardiopulmonary tolerance)                Time: 2094-7096 OT Time Calculation (min): 20 min Charges:  OT General Charges $OT Visit: 1  Visit OT Evaluation $OT Eval Low Complexity: 1 Low  Malachy Chamber, OTR/L Acute Rehab Services Office: [phone]  Frances Medina 05/04/2020, 12:56 PM

## 2020-05-05 DIAGNOSIS — G7000 Myasthenia gravis without (acute) exacerbation: Secondary | ICD-10-CM | POA: Diagnosis not present

## 2020-05-05 DIAGNOSIS — R0602 Shortness of breath: Secondary | ICD-10-CM | POA: Diagnosis not present

## 2020-05-05 DIAGNOSIS — R0600 Dyspnea, unspecified: Secondary | ICD-10-CM | POA: Diagnosis not present

## 2020-05-05 LAB — BASIC METABOLIC PANEL
Anion gap: 10 (ref 5–15)
BUN: 14 mg/dL (ref 6–20)
CO2: 27 mmol/L (ref 22–32)
Calcium: 9.5 mg/dL (ref 8.9–10.3)
Chloride: 104 mmol/L (ref 98–111)
Creatinine, Ser: 0.66 mg/dL (ref 0.44–1.00)
GFR, Estimated: >60 mL/min (ref >60)
Glucose, Bld: 109 mg/dL — ABNORMAL HIGH (ref 70–99)
Potassium: 4.1 mmol/L (ref 3.5–5.1)
Sodium: 141 mmol/L (ref 135–145)

## 2020-05-05 LAB — CBC
HCT: 39 % (ref 36.0–46.0)
Hemoglobin: 11.8 g/dL — ABNORMAL LOW (ref 12.0–15.0)
MCH: 22.1 pg — ABNORMAL LOW (ref 26.0–34.0)
MCHC: 30.3 g/dL (ref 30.0–36.0)
MCV: 72.9 fL — ABNORMAL LOW (ref 80.0–100.0)
Platelets: 141 10*3/uL — ABNORMAL LOW (ref 150–400)
RBC: 5.35 MIL/uL — ABNORMAL HIGH (ref 3.87–5.11)
RDW: 19.5 % — ABNORMAL HIGH (ref 11.5–15.5)
WBC: 8.3 10*3/uL (ref 4.0–10.5)
nRBC: 0 % (ref 0.0–0.2)

## 2020-05-05 MED ORDER — CEPHALEXIN 500 MG PO CAPS: 500.0000 mg | ORAL_CAPSULE | Freq: Two times a day (BID) | ORAL | 0 refills | Status: DC

## 2020-05-05 MED ORDER — PYRIDOSTIGMINE BROMIDE 60 MG PO TABS: 30.0000 mg | ORAL_TABLET | Freq: Three times a day (TID) | ORAL | 0 refills | Status: DC

## 2020-05-05 MED ORDER — SERTRALINE HCL 100 MG PO TABS: 100.0000 mg | ORAL_TABLET | Freq: Every day | ORAL | 0 refills | Status: DC

## 2020-05-05 NOTE — Evaluation (Signed)
Physical Therapy Evaluation Patient Details Name: Frances Medina MRN: [id number] DOB: 10/29/1960 Today's Date: 05/05/2020   History of Present Illness  The pt is a 60 yo female presenting 3/2 with x3 days SOB. Pt found to have increased bilateral LE edema, imaging ruled out PE, PNA, or pneumothorax. PMH includes: well-controlled myasthenia gravis, osteoarthritis, morbid obeisty, GERD, anemia, OSA, and depression/anxiety.    Clinical Impression  Pt in bed upon arrival of PT, agreeable to evaluation at this time. Prior to admission the pt was completely independent with mobility and ADLs, living with multiple members of her family in a home with 4 steps to enter and 14 steps to her bedroom.  The pt now presents with minor limitations in functional mobility and endurance due to above dx, but is safe to return home with family support when medically cleared. The pt was able to complete multiple transfers and ambulation in her room without assist or AD, and completed 250 ft hallway ambulation and stairs with cane or rail. The pt was educated on progressive walking HEP and expressed understanding of how to execute and importance. The pt has no further acute PT needs, thank you for the consult.     Follow Up Recommendations No PT follow up;Supervision - Intermittent    Equipment Recommendations  None recommended by PT (pt has needed equipment)    Recommendations for Other Services       Precautions / Restrictions Precautions Precautions: None Precaution Comments: low fall Restrictions Weight Bearing Restrictions: No      Mobility  Bed Mobility Overal bed mobility: Independent             General bed mobility comments: in chair on entry    Transfers Overall transfer level: Independent Equipment used: None             General transfer comment: Independent for sit to stand from recliner and short mobility laps in room without AD  Ambulation/Gait Ambulation/Gait assistance:  Modified independent (Device/Increase time) Gait Distance (Feet): 250 Feet Assistive device: Straight cane Gait Pattern/deviations: Step-through pattern;Wide base of support Gait velocity: 0.66 m/s Gait velocity interpretation: 1.31 - 2.62 ft/sec, indicative of limited community ambulator General Gait Details: pt with wide BOS and slightly increased lateral movement, but able to maintain SpO2 >90% and no LOB with gait  Stairs Stairs: Yes Stairs assistance: Supervision Stair Management: One rail Left;Step to pattern;Forwards Number of Stairs: 12 General stair comments: pt able to complete with step-to pattern and use of single rail     Balance Overall balance assessment: Mild deficits observed, not formally tested                                           Pertinent Vitals/Pain Pain Assessment: No/denies pain    Home Living Family/patient expects to be discharged to:: Private residence Living Arrangements: Spouse/significant other;Children;Other (Comment) (mother in law) Available Help at Discharge: Family;Available PRN/intermittently Type of Home: House Home Access: Stairs to enter Entrance Stairs-Rails: Left Entrance Stairs-Number of Steps: 4 Home Layout: Two level Home Equipment: Walker - 2 wheels;Cane - single point Additional Comments: 3 steps into garage apartment for mother in law    Prior Function Level of Independence: Independent         Comments: Does not typically use cane inside home, may use it outside in community. Independent with ADLs, IADLs and assists mother in law  in AM while family/children at work (does not require physical assistance). Pt was exercising daily, up/down flight of steps in home     Hand Dominance   Dominant Hand: Right    Extremity/Trunk Assessment   Upper Extremity Assessment Upper Extremity Assessment: Overall WFL for tasks assessed    Lower Extremity Assessment Lower Extremity Assessment: Overall WFL for  tasks assessed    Cervical / Trunk Assessment Cervical / Trunk Assessment: Other exceptions Cervical / Trunk Exceptions: large body habitus  Communication   Communication: No difficulties  Cognition Arousal/Alertness: Awake/alert Behavior During Therapy: WFL for tasks assessed/performed Overall Cognitive Status: Within Functional Limits for tasks assessed                                 General Comments: pt with good understanding of imporance of exercise, walking program, able to demo good self-monitoring      General Comments General comments (skin integrity, edema, etc.): HR to 120s with stair navigation. SpO2 98% on RA after 250 ft ambulation on RA    Exercises     Assessment/Plan    PT Assessment Patent does not need any further PT services  PT Problem List         PT Treatment Interventions      PT Goals (Current goals can be found in the Care Plan section)  Acute Rehab PT Goals Patient Stated Goal: figure out what caused this episode, return to normal activities, normalize liver size PT Goal Formulation: With patient Time For Goal Achievement: 05/05/20 Potential to Achieve Goals: Good     AM-PAC PT "6 Clicks" Mobility  Outcome Measure Help needed turning from your back to your side while in a flat bed without using bedrails?: None Help needed moving from lying on your back to sitting on the side of a flat bed without using bedrails?: None Help needed moving to and from a bed to a chair (including a wheelchair)?: None Help needed standing up from a chair using your arms (e.g., wheelchair or bedside chair)?: None Help needed to walk in hospital room?: None Help needed climbing 3-5 steps with a railing? : A Little 6 Click Score: 23    End of Session Equipment Utilized During Treatment: Gait belt Activity Tolerance: Patient tolerated treatment well Patient left: in chair;with call bell/phone within reach (with respiratory therapist) Nurse  Communication: Mobility status PT Visit Diagnosis: Other abnormalities of gait and mobility (R26.89)    Time: 6606-3016 PT Time Calculation (min) (ACUTE ONLY): 20 min   Charges:   PT Evaluation $PT Eval Low Complexity: 1 Low          Karma Ganja, PT, DPT   Acute Rehabilitation Department Pager #: [phone]  Otho Bellows 05/05/2020, 8:39 AM

## 2020-05-05 NOTE — Progress Notes (Signed)
Discharge orders discussed with patient and new medications reviewed. Patient questions answered

## 2020-05-05 NOTE — Progress Notes (Signed)
Subjective: No SOB, feels back to normal.   Exam: Vitals:   05/05/20 0345 05/05/20 0800  BP: (!) 144/82 (!) 138/92  Pulse:  77  Resp: (!) 21 20  Temp: 98 F (36.7 C) 98.7 F (37.1 C)  SpO2: 97% 98%   Gen: In bed, NAD Resp: non-labored breathing, no acute distress She is able to count to 23 on a single breath Abd: soft, nt  Neuro: MS: Awake, alert, interactive and appropriate CN: EOMI, no diplopia, she does have mild left ptosis which very slightly worsens with sustained upgaze, ? Slightly less prominent today Motor: 5/5  neck flexion Sensory: Intact light touch  Pertinent Labs: BMP-unremarkable  Impression: 60 year old female with myasthenia gravis who presents with shortness of breath without other signs of myasthenic crisis or exacerbation.  She has improved and is tolerating the mestinon well. I would not pursue other immunosuppressant at this time.   If needed, she has significant room for increasing her mestinon dose. If night time SOB is an issue, could add a mestinon extended release capsule 180mg  QHS, but would only add things as needed.   Recommendations: 1) Mestinon 30 mg 3 times daily 2) f/u with regular outpatient neurology. Please call with further questions or concerns.   Roland Rack, MD [facility] [phone]  If 7pm- 7am, please page neurology on call as listed in [place].

## 2020-05-05 NOTE — Discharge Summary (Signed)
[facility] Discharge Summary  Patient name: Frances Medina Medical record number: [id number] Date of birth: 07/28/1960 Age: 60 y.o. Gender: female Date of Admission: 05/02/2020  Date of Discharge: 05/05/2020  Admitting Physician: Rise Patience, DO  Primary Care Provider: [name], MD Consultants: Neurology  Indication for Hospitalization: Dyspnea on exertion  Discharge Diagnoses/Problem List:  Dyspnea on exertion E. Coli UTI Myasthenia gravis Hypokalemia GERD Morbid obesity OA  Depression/Anxiety  Disposition: Home  Discharge Condition: Stable, improved  Discharge Exam:  Gen: NAD, sitting up in chair next to bed, in good spirits, had just worked with PT CV: RRR, no murmur appreciated, no peripheral edema noted Respiratory: CTAB, comfortable on room air, no increased WOB Extremities: able to move all extremities equally and appropriately, no peripheral edema noted  Brief Hospital Course:  Frances Medina is a 60 y.o. female presenting with acute dyspnea on exertion. PMH is significant for well-controlled myasthenia gravis on mycophenolate, osteoarthritis, morbid obesity, GERD, migraine headaches, iron deficiency anemia, OSA on CPAP, mild restrictive lung disease evident on PFTs, and anxiety.  Acute dyspnea on exertion without hypoxia: Stable. Myasthenia gravis Patient admitted with dyspnea on exertion, initially concerning for myasthenic crisis/flare as patient was also noted to have ptosis present on exam. Neurology was consulted and there was low suspicion for myasthenic crisis. NIFs were obtained and patient remained at -40. Patient was started on Mestinon with reported improvement in symptoms. Minimal concern for possible cardiac origin as troponin was negative and echocardiogram was obtained and showed EF 60-65% with moderate concentric LVH and normal diastolic parameters. Patient worked with physical therapy and was able to ambulate a flight  of stairs with no continued symptoms.  E. Coli UTI: Patient found to have a UA with nitrites and leukocytes, culture collected and notable for E. Coli >100,000 CFU. Patient was started on Keflex on 3/4 and discharged with the medication to continue for a total of 7 antibiotic days.   Hypokalemia Patient had hypokalemia, which was repleted in the hospital and remained stable throughout admission.   Issues for follow-up: 1. Recommend outpatient follow-up to monitor resolution of UTI symptoms. 2. Patient restarted on prior medication of Mestinon by neurology in the hospital as ptosis was noted and there was no other identifiable cause of worsening dyspnea and ptosis.   Significant Procedures: None  Significant Labs and Imaging:  Recent Labs  Lab 05/02/20 2319 05/04/20 0420 05/05/20 0332  WBC 8.4 7.1 8.3  HGB 11.6* 11.7* 11.8*  HCT 38.4 37.7 39.0  PLT 151 140* 141*   Recent Labs  Lab 05/02/20 2319 05/04/20 0420 05/05/20 0332  NA 136 139 141  K 3.3* 3.3* 4.1  CL 103 103 104  CO2 24 26 27   GLUCOSE 105* 111* 109*  BUN 7 8 14   CREATININE 0.59 0.64 0.66  CALCIUM 8.9 9.2 9.5  ALKPHOS 75  --   --   AST 18  --   --   ALT 14  --   --   ALBUMIN 3.4*  --   --     DG Chest 2 View  Result Date: 05/02/2020 CLINICAL DATA:  Shortness of breath EXAM: CHEST - 2 VIEW COMPARISON:  02/04/2018 FINDINGS: Cardiac shadow is within normal limits. Mild right basilar scarring is noted stable from the prior study. No new focal infiltrate or effusion is seen. Degenerative changes of the thoracic spine are noted. IMPRESSION: Mild scarring in the right lung base.  No acute abnormality noted. Electronically  Signed   By: Inez Catalina M.D.   On: 05/02/2020 16:45   CT ANGIO CHEST PE W OR WO CONTRAST  Result Date: 05/03/2020 CLINICAL DATA:  Shortness of breath EXAM: CT ANGIOGRAPHY CHEST WITH CONTRAST TECHNIQUE: Multidetector CT imaging of the chest was performed using the standard protocol during bolus  administration of intravenous contrast. Multiplanar CT image reconstructions and MIPs were obtained to evaluate the vascular anatomy. CONTRAST:  70mL OMNIPAQUE IOHEXOL 350 MG/ML SOLN COMPARISON:  06/18/2017 FINDINGS: Cardiovascular: Satisfactory pulmonary artery opacification but there is bolus dispersion, artifact from body habitus, and multiple areas of streak artifact. No visible pulmonary embolism. Normal heart size. No pericardial effusion. Negative aorta. Mediastinum/Nodes: Negative for adenopathy or mass. Lungs/Pleura: Band of atelectasis at the bases, subsegmental. There is no edema, consolidation, effusion, or pneumothorax. Upper Abdomen: No acute finding. Musculoskeletal: Spondylosis with bridging osteophytes in the lower thoracic spine. Review of the MIP images confirms the above findings. IMPRESSION: 1. Limited study due to patient factors. No evidence of pulmonary embolism. 2. Lower lobe atelectasis. Electronically Signed   By: Monte Fantasia M.D.   On: 05/03/2020 08:37   ECHOCARDIOGRAM COMPLETE  Result Date: 05/03/2020    ECHOCARDIOGRAM REPORT   Patient Name:   Frances Medina Date of Exam: 05/03/2020 Medical Rec #:  [id number]      Height:       60.0 in Accession #:    [id number]     Weight:       320.0 lb Date of Birth:  1960-09-24      BSA:          2.279 m Patient Age:    23 years       BP:           102/73 mmHg Patient Gender: F              HR:           65 bpm. Exam Location:  Inpatient Procedure: 2D Echo, Cardiac Doppler and Color Doppler Indications:    Shortness of breath  History:        Patient has prior history of Echocardiogram examinations, most                 recent 08/23/2018. Signs/Symptoms:Shortness of Breath; Risk                 Factors:Hypertension and Diabetes.  Sonographer:    Luisa Hart RDCS Referring Phys: [place]  1. Left ventricular ejection fraction, by estimation, is 60 to 65%. The left ventricle has normal function. The left ventricle has no  regional wall motion abnormalities. There is moderate concentric left ventricular hypertrophy. Left ventricular diastolic parameters were normal.  2. Right ventricular systolic function is normal. The right ventricular size is normal. There is normal pulmonary artery systolic pressure.  3. The mitral valve is grossly normal. Trivial mitral valve regurgitation.  4. The aortic valve is tricuspid. There is mild calcification of the aortic valve. There is mild thickening of the aortic valve. Aortic valve regurgitation is not visualized. Mild aortic valve sclerosis is present, with no evidence of aortic valve stenosis. Comparison(s): No significant change from prior study. FINDINGS  Left Ventricle: Left ventricular ejection fraction, by estimation, is 60 to 65%. The left ventricle has normal function. The left ventricle has no regional wall motion abnormalities. The left ventricular internal cavity size was normal in size. There is  moderate concentric left ventricular hypertrophy. Left ventricular diastolic parameters were  normal. Right Ventricle: The right ventricular size is normal. Right vetricular wall thickness was not well visualized. Right ventricular systolic function is normal. There is normal pulmonary artery systolic pressure. The tricuspid regurgitant velocity is 2.35 m/s, and with an assumed right atrial pressure of 3 mmHg, the estimated right ventricular systolic pressure is 95.2 mmHg. Left Atrium: Left atrial size was normal in size. Right Atrium: Right atrial size was normal in size. Pericardium: There is no evidence of pericardial effusion. Mitral Valve: The mitral valve is grossly normal. Mild mitral annular calcification. Trivial mitral valve regurgitation. Tricuspid Valve: The tricuspid valve is normal in structure. Tricuspid valve regurgitation is trivial. Aortic Valve: The aortic valve is tricuspid. There is mild calcification of the aortic valve. There is mild thickening of the aortic valve.  Aortic valve regurgitation is not visualized. Mild aortic valve sclerosis is present, with no evidence of aortic valve stenosis. Aortic valve mean gradient measures 5.0 mmHg. Aortic valve peak gradient measures 11.4 mmHg. Aortic valve area, by VTI measures 2.18 cm. Pulmonic Valve: The pulmonic valve was not well visualized. Pulmonic valve regurgitation is trivial. Aorta: The aortic root and ascending aorta are structurally normal, with no evidence of dilitation. IAS/Shunts: No atrial level shunt detected by color flow Doppler.  LEFT VENTRICLE PLAX 2D LVIDd:         3.80 cm     Diastology LVIDs:         2.40 cm     LV e' lateral:   0.07 cm/s LV PW:         1.30 cm     LV E/e' lateral: 7.2 LV IVS:        1.30 cm LVOT diam:     1.80 cm LV SV:         71 LV SV Index:   31 LVOT Area:     2.54 cm  LV Volumes (MOD) LV vol d, MOD A2C: 39.8 ml LV vol d, MOD A4C: 56.1 ml LV vol s, MOD A2C: 14.8 ml LV vol s, MOD A4C: 16.6 ml LV SV MOD A2C:     25.0 ml LV SV MOD A4C:     56.1 ml LV SV MOD BP:      31.7 ml RIGHT VENTRICLE RV S prime:     16.10 cm/s TAPSE (M-mode): 1.5 cm LEFT ATRIUM           Index       RIGHT ATRIUM           Index LA diam:      2.80 cm 1.23 cm/m  RA Area:     22.20 cm LA Vol (A4C): 52.4 ml 22.99 ml/m RA Volume:   73.10 ml  32.07 ml/m  AORTIC VALVE                    PULMONIC VALVE AV Area (Vmax):    2.08 cm     PV Vmax:       0.75 m/s AV Area (Vmean):   2.13 cm     PV Vmean:      81.400 cm/s AV Area (VTI):     2.18 cm     PV VTI:        0.266 m AV Vmax:           169.00 cm/s  PV Peak grad:  2.2 mmHg AV Vmean:          107.000 cm/s PV Mean grad:  3.0 mmHg AV  VTI:            0.324 m AV Peak Grad:      11.4 mmHg AV Mean Grad:      5.0 mmHg LVOT Vmax:         138.00 cm/s LVOT Vmean:        89.600 cm/s LVOT VTI:          0.278 m LVOT/AV VTI ratio: 0.86  AORTA Ao Root diam: 2.90 cm Ao Asc diam:  2.70 cm MITRAL VALVE               TRICUSPID VALVE MV Area (PHT): 2.91 cm    TR Peak grad:   22.1 mmHg MV Decel  Time: 261 msec    TR Vmax:        235.00 cm/s MV E velocity: 0.51 cm/s MV A velocity: 85.70 cm/s  SHUNTS MV E/A ratio:  0.01        Systemic VTI:  0.28 m                            Systemic Diam: 1.80 cm Gwyndolyn Kaufman MD Electronically signed by Gwyndolyn Kaufman MD Signature Date/Time: 05/03/2020/4:16:11 PM    Final     Results/Tests Pending at Time of Discharge: None  Discharge Medications:  Allergies as of 05/05/2020      Reactions   Ciprofloxacin Shortness Of Breath, Rash   Naproxen Shortness Of Breath   Shrimp [shellfish Allergy] Hives, Shortness Of Breath   ER required   Dicyclomine Hives   Methocarbamol Hives   Pineapple Itching   Itchy lips and tongue   Morphine And Related Other (See Comments)   Severe headache      Medication List    STOP taking these medications   Debrox 6.5 % OTIC solution Generic drug: carbamide peroxide     TAKE these medications   acetaminophen 500 MG tablet Commonly known as: TYLENOL Take 500 mg by mouth in the morning and at bedtime.   albuterol (2.5 MG/3ML) 0.083% nebulizer solution Commonly known as: PROVENTIL USE 1 VIAL IN NEBULIZER EVERY 6 HOURS AS NEEDED FOR SHORTNESS OF BREATH AND WHEEZING What changed: Another medication with the same name was changed. Make sure you understand how and when to take each.   albuterol 108 (90 Base) MCG/ACT inhaler Commonly known as: VENTOLIN HFA INHALE 2 PUFFS INTO THE LUNGS EVERY 6 HOURS AS NEEDED FOR WHEEZE OR SHORTNESS OF BREATH What changed: See the new instructions.   APPLE CIDER VINEGAR PO Take 1 capsule by mouth daily.   cephALEXin 500 MG capsule Commonly known as: KEFLEX Take 1 capsule (500 mg total) by mouth 2 (two) times daily.   cetirizine 10 MG tablet Commonly known as: ZYRTEC TAKE 1 TABLET (10 MG TOTAL) BY MOUTH DAILY. NO THERAPEUTIC SUBSTITUTION What changed: additional instructions   clonazePAM 0.5 MG tablet Commonly known as: KLONOPIN TAKE 1 TABLET BY MOUTH TWICE A DAY AS  NEEDED FOR ANXIETY What changed: See the new instructions.   docusate sodium 100 MG capsule Commonly known as: COLACE Take 1 capsule (100 mg total) by mouth 2 (two) times daily as needed for mild constipation or moderate constipation.   ferrous sulfate 325 (65 FE) MG tablet Take 1 tablet (325 mg total) by mouth daily.   fluticasone 50 MCG/ACT nasal spray Commonly known as: FLONASE Place 2 sprays into both nostrils daily. What changed:   when to take this  reasons to take this   gabapentin 300 MG capsule Commonly known as: NEURONTIN Take 300 mg by mouth 3 (three) times daily as needed (pain).   guaiFENesin 100 MG/5ML Soln Commonly known as: ROBITUSSIN Take 15 mLs by mouth daily as needed for cough or to loosen phlegm.   Menthol 10 MG Lozg Take 1 lozenge by mouth daily as needed (cough).   montelukast 10 MG tablet Commonly known as: SINGULAIR TAKE 1 TABLET BY MOUTH EVERYDAY AT BEDTIME What changed: See the new instructions.   mycophenolate 500 MG tablet Commonly known as: CELLCEPT Take 1,000 mg by mouth 2 (two) times daily.   omeprazole 20 MG capsule Commonly known as: PRILOSEC Take 1 capsule (20 mg total) by mouth daily.   oxyCODONE 5 MG immediate release tablet Commonly known as: Oxy IR/ROXICODONE Take 5 mg by mouth every 6 (six) hours as needed for moderate pain.   pyridostigmine 60 MG tablet Commonly known as: MESTINON Take 0.5 tablets (30 mg total) by mouth 3 (three) times daily.   sertraline 100 MG tablet Commonly known as: ZOLOFT Take 1 tablet (100 mg total) by mouth daily. Start taking on: May 06, 2020 What changed:   medication strength  how much to take   VITAMIN B12 PO Take 1 tablet by mouth daily.   VITAMIN C PO Take 1,000 mg by mouth daily.   VITAMIN D (CHOLECALCIFEROL) PO Take 1 tablet by mouth daily. otc   Vitamin D (Ergocalciferol) 1.25 MG (50000 UNIT) Caps capsule Commonly known as: DRISDOL Take 50,000 Units by mouth once a  week.       Discharge Instructions: Please refer to Patient Instructions section of EMR for full details.  Patient was counseled important signs and symptoms that should prompt return to medical care, changes in medications, dietary instructions, activity restrictions, and follow up appointments.   Follow-Up Appointments:  Follow-up Information    [name], MD. Go on 05/18/2020.   Specialty: Family Medicine Why:  go to appt at 1:30 PM Contact information: [place] [address] [phone]              Neurology  - Please follow-up with or contact your outpatient neurologist in the next 1-2 weeks.   Rise Patience, DO 05/05/2020, 12:42 PM PGY-1, [place]

## 2020-05-05 NOTE — Plan of Care (Signed)
  Problem: Activity: Goal: Risk for activity intolerance will decrease Outcome: Progressing   Problem: Clinical Measurements: Goal: Ability to maintain clinical measurements within normal limits will improve Outcome: Progressing Goal: Will remain free from infection Outcome: Progressing Goal: Diagnostic test results will improve Outcome: Progressing Goal: Respiratory complications will improve Outcome: Progressing Goal: Cardiovascular complication will be avoided Outcome: Progressing

## 2020-05-05 NOTE — Plan of Care (Signed)

## 2020-05-05 NOTE — Discharge Instructions (Signed)
You were hospitalized due to having increasing difficulty with breathing during exertion.  There was initially some concern that it could be related to your myasthenia gravis, neurology saw you in the hospital and it was determined that this was not a myasthenic crisis.  You underwent further evaluation including an echocardiogram, which did not have any concerns for cardiac origin of this difficulty with breathing.  You were restarted on Mestinon per neurology, and it is important that you follow-up with your outpatient neurologist. You were also found to have a urinary tract infection and was started on an antibiotic called Keflex.  You will continue this antibiotic for the next 5 days.  Please follow-up with your primary care provider to ensure resolution of symptoms.

## 2020-05-06 LAB — URINE CULTURE: Culture: >=100000 — AB

## 2020-05-08 NOTE — Progress Notes (Signed)
Tx cephalexin Sensitivity reviewed and OK

## 2020-05-18 ENCOUNTER — Ambulatory Visit: Payer: Medicare Other

## 2020-05-18 NOTE — Chronic Care Management (AMB) (Signed)
Care Management    RN Visit Note  05/18/2020 Name: Frances Medina MRN: [id number] DOB: 09-Mar-1960  Subjective: Frances Medina is a 60 y.o. year old female who is a primary care patient of [name], MD. The care management team was consulted for assistance with disease management and care coordination needs.    Engaged with patient by telephone for follow up visit in response to provider referral for case management and/or care coordination services.   Consent to Services:   Frances Medina was given information about Care Management services today including:  1. Care Management services includes personalized support from designated clinical staff supervised by her physician, including individualized plan of care and coordination with other care providers 2. 24/7 contact phone numbers for assistance for urgent and routine care needs. 3. The patient may stop case management services at any time by phone call to the office staff.  Patient agreed to services and consent obtained.    Assessment: Patient is making progress with her breathing and weight loss. . See Care Plan below for interventions and patient self-care actives. Follow up Plan: Patient would like continued follow-up.  CCM RNCM will outreach the patient within the next 30 days.. Patient will call office if needed prior to next encounter  Review of patient past medical history, allergies, medications, health status, including review of consultants reports, laboratory and other test data, was performed as part of comprehensive evaluation and provision of chronic care management services.   SDOH (Social Determinants of Health) assessments and interventions performed:    Care Plan  Allergies  Allergen Reactions  . Ciprofloxacin Shortness Of Breath and Rash  . Naproxen Shortness Of Breath  . Shrimp [Shellfish Allergy] Hives and Shortness Of Breath    ER required  . Dicyclomine Hives  . Methocarbamol Hives  . Pineapple  Itching    Itchy lips and tongue  . Morphine And Related Other (See Comments)    Severe headache    Outpatient Encounter Medications as of 05/18/2020  Medication Sig  . acetaminophen (TYLENOL) 500 MG tablet Take 500 mg by mouth in the morning and at bedtime.  [name] albuterol (PROVENTIL) (2.5 MG/3ML) 0.083% nebulizer solution USE 1 VIAL IN NEBULIZER EVERY 6 HOURS AS NEEDED FOR SHORTNESS OF BREATH AND WHEEZING  . albuterol (VENTOLIN HFA) 108 (90 Base) MCG/ACT inhaler INHALE 2 PUFFS INTO THE LUNGS EVERY 6 HOURS AS NEEDED FOR WHEEZE OR SHORTNESS OF BREATH (Patient taking differently: Inhale into the lungs every 6 (six) hours as needed for shortness of breath or wheezing.)  . APPLE CIDER VINEGAR PO Take 1 capsule by mouth daily.  . Ascorbic Acid (VITAMIN C PO) Take 1,000 mg by mouth daily.  . cephALEXin (KEFLEX) 500 MG capsule Take 1 capsule (500 mg total) by mouth 2 (two) times daily.  . cetirizine (ZYRTEC) 10 MG tablet TAKE 1 TABLET (10 MG TOTAL) BY MOUTH DAILY. NO THERAPEUTIC SUBSTITUTION (Patient taking differently: Take 10 mg by mouth daily.)  . clonazePAM (KLONOPIN) 0.5 MG tablet TAKE 1 TABLET BY MOUTH TWICE A DAY AS NEEDED FOR ANXIETY (Patient taking differently: Take by mouth 2 (two) times daily as needed for anxiety.)  . Cyanocobalamin (VITAMIN B12 PO) Take 1 tablet by mouth daily.  [name] docusate sodium (COLACE) 100 MG capsule Take 1 capsule (100 mg total) by mouth 2 (two) times daily as needed for mild constipation or moderate constipation.  . ferrous sulfate 325 (65 FE) MG tablet Take 1 tablet (325 mg total) by  mouth daily.  . fluticasone (FLONASE) 50 MCG/ACT nasal spray Place 2 sprays into both nostrils daily. (Patient taking differently: Place 2 sprays into both nostrils daily as needed for allergies.)  . gabapentin (NEURONTIN) 300 MG capsule Take 300 mg by mouth 3 (three) times daily as needed (pain).  [name] guaiFENesin (ROBITUSSIN) 100 MG/5ML SOLN Take 15 mLs by mouth daily as needed for cough or  to loosen phlegm.  . Menthol 10 MG LOZG Take 1 lozenge by mouth daily as needed (cough).  . montelukast (SINGULAIR) 10 MG tablet TAKE 1 TABLET BY MOUTH EVERYDAY AT BEDTIME (Patient taking differently: Take 10 mg by mouth at bedtime.)  . mycophenolate (CELLCEPT) 500 MG tablet Take 1,000 mg by mouth 2 (two) times daily.  [name] omeprazole (PRILOSEC) 20 MG capsule Take 1 capsule (20 mg total) by mouth daily.  [name] oxyCODONE (OXY IR/ROXICODONE) 5 MG immediate release tablet Take 5 mg by mouth every 6 (six) hours as needed for moderate pain.  [name] pyridostigmine (MESTINON) 60 MG tablet Take 0.5 tablets (30 mg total) by mouth 3 (three) times daily.  . sertraline (ZOLOFT) 100 MG tablet Take 1 tablet (100 mg total) by mouth daily.  [name] VITAMIN D, CHOLECALCIFEROL, PO Take 1 tablet by mouth daily. otc  . Vitamin D, Ergocalciferol, (DRISDOL) 1.25 MG (50000 UNIT) CAPS capsule Take 50,000 Units by mouth once a week.   No facility-administered encounter medications on file as of 05/18/2020.    Patient Active Problem List   Diagnosis Date Noted  . Dyspnea on exertion 05/03/2020  . Allergic rhinosinusitis 04/05/2020  . Abdominal pain 08/26/2019  . Hip pain, bilateral 08/26/2019  . Positive ANA (antinuclear antibody) 08/26/2019  . Tremor 07/19/2019  . History of colonic polyps   . Epigastric pain   . Early satiety   . Macromastia 06/17/2019  . Seasonal allergies 02/18/2019  . GAD (generalized anxiety disorder) 08/06/2018  . Iron deficiency anemia 07/31/2017  . Pre-diabetes 07/31/2017  . Osteoarthritis of left hip 02/18/2017  . OSA on CPAP 09/11/2016  . Restrictive lung disease 10/11/2015  . Healthcare maintenance 09/14/2015  . Major depression   . Headache, migraine   . Shortness of breath   . Splenomegaly 04/07/2015  . Myasthenia gravis ([place])   . Osteoarthritis of left knee 12/02/2013  . Benign neoplasm of descending colon 09/19/2013  . Osteoarthritis of right knee 08/30/2013  . GERD (gastroesophageal  reflux disease) 06/23/2013  . Morbid obesity ([place]) 06/23/2013  . Fibroids 06/23/2013    Conditions to be addressed/monitored: Weight loss  Care Plan : RN Case Manager  Updates made by [name], RN since 05/18/2020 12:00 AM  Problem: Weight Management (Obesity)   Long-Range Goal: Weight Loss   Start Date: 03/22/2019  Expected End Date: 07/31/2020  Recent Progress: On track  Priority: Medium  Note:   Current Barriers:  [name] Knowledge Deficits related to weight loss strategies as evidenced by weight gain reported weight 315 lbs. . Chronic Disease Management support and education needs related to weight management in patient with OSA/Restrictive Lung Diease Nurse Case Manager Clinical Goal(s):  [name] Over the next 90 days, patient will verbalize basic understanding of plan for initiation of weight loss strategies . Over the next 90 days, patient will demonstrate improved health management independence as evidenced by keeping daily food log/diary and increasing daily water intake (patient does not have provider prescribed fluid restriction) . Over the next 30 days, patient will work with RN Case Freight forwarder to address needs related to any  barriers that she may have related to weight loss  Interventions:  Evaluation of current treatment plan related to weight loss and patient's adherence to plan as established by provider . Provided verbal and/or written education to patient re: recommended life style changes: avoid fad diets, make small/incremental dietary and light exercise changes( IE.., walk around her kitchen table, sit in a chair and exercise her arms with  small water bottles for a few minutes a day.), eat at the table and avoid eating in front of the TV, plan management of cravings, monitor snacking and cravings in food diary . Discussed plans with patient for ongoing care management follow up and provided patient with direct contact information for care management team . Reduces sodium in her  diet, discussed the effects of sodium on the body. . Weigh monthly . Be realistic on weight to lose.  Patient states 1-2 lbs to start. [name] activation or motivation to change monitored . activity and exercise based on tolerance encouraged . barriers to physical activity or exercise addressed . encouragement and support provided . participation in weight loss program or support group encouraged . success praised . Continue toi monitor food and fluid intake .  Taking her medications as prescribed  . Patient reports that she is feeling better.  She had a hospital stay from 3/322-05/05/20 for shortness of breath.  She states that she was started on Mestinon.  She reports that she also lost weight she is down to 308 lbs. . Patient is still waiting for CPAP.  [employer] is behind because of shipping issues.   Patient Goals/Self Care Activities:  . Performs ADL's independently . Calls provider office for new concerns or questions        [name] RN, BSN, [facility] Care Management Coordinator [place] Phone: [phone] I Fax: [phone]

## 2020-05-18 NOTE — Patient Instructions (Signed)
Visit Information  Frances Medina  it was nice speaking with you. Please call me directly [phone] if you have questions about the goals we discussed.  Goals Addressed            This Visit's Progress   . Develop a Weight Loss Readiness Plan       Timeframe:  Long-Range Goal Priority:  Medium Start Date:      02/13/20                       Expected End Date:    07/31/20      Follow Up Date 06/18/20   Patient Goals/Self Care Activities:  . Performs ADL's independently . Calls provider office for new concerns or questions . Move as tolerated and rest when needed       Why is this important?    Losing weight requires time to prepare your mind and your home.   Identify your eating habits and the emotions attached to eating.   Think about ways to be successful.   When you are not ready, you could lose motivation and give up on your plan.     Notes:         The patient verbalized understanding of instructions, educational materials, and care plan provided today and declined offer to receive copy of patient instructions, educational materials, and care plan.   Follow up Plan: Patient would like continued follow-up.  CCM RNCM will outreach the patient within the next 30 days.. Patient will call office if needed prior to next encounter  [name], RN

## 2020-05-19 ENCOUNTER — Other Ambulatory Visit: Payer: Self-pay | Admitting: Family Medicine

## 2020-05-22 DIAGNOSIS — Z6841 Body Mass Index (BMI) 40.0 and over, adult: Secondary | ICD-10-CM | POA: Diagnosis not present

## 2020-05-22 DIAGNOSIS — Z713 Dietary counseling and surveillance: Secondary | ICD-10-CM | POA: Diagnosis not present

## 2020-06-14 ENCOUNTER — Other Ambulatory Visit: Payer: Self-pay | Admitting: Physician Assistant

## 2020-06-18 ENCOUNTER — Ambulatory Visit: Payer: Medicare Other

## 2020-06-18 NOTE — Chronic Care Management (AMB) (Signed)
Care Management    RN Visit Note  06/18/2020 Name: Frances Medina MRN: [id number] DOB: 02-02-1961  Subjective: Frances Medina is a 60 y.o. year old female who is a primary care patient of [name], MD. The care management team was consulted for assistance with disease management and care coordination needs.    Engaged with patient by telephone for follow up visit in response to provider referral for case management and/or care coordination services.   The patient was given information about Chronic Care Management services, agreed to services, and gave verbal consent prior to initiation of services.  Please see initial visit note for detailed documentation.  Patient agreed to services and consent obtained.   Assessment: Review of patient past medical history, allergies, medications, health status, including review of consultants reports, laboratory and other test data, was performed as part of comprehensive evaluation and provision of chronic care management services.   SDOH (Social Determinants of Health) assessments and interventions performed:    Care Plan  Allergies  Allergen Reactions  . Ciprofloxacin Shortness Of Breath and Rash  . Naproxen Shortness Of Breath  . Shrimp [Shellfish Allergy] Hives and Shortness Of Breath    ER required  . Dicyclomine Hives  . Methocarbamol Hives  . Pineapple Itching    Itchy lips and tongue  . Morphine And Related Other (See Comments)    Severe headache    Outpatient Encounter Medications as of 06/18/2020  Medication Sig  . acetaminophen (TYLENOL) 500 MG tablet Take 500 mg by mouth in the morning and at bedtime.  [name] albuterol (PROVENTIL) (2.5 MG/3ML) 0.083% nebulizer solution USE 1 VIAL IN NEBULIZER EVERY 6 HOURS AS NEEDED FOR SHORTNESS OF BREATH AND WHEEZING  . albuterol (VENTOLIN HFA) 108 (90 Base) MCG/ACT inhaler INHALE 2 PUFFS INTO THE LUNGS EVERY 6 HOURS AS NEEDED FOR WHEEZE OR SHORTNESS OF BREATH (Patient taking differently:  Inhale into the lungs every 6 (six) hours as needed for shortness of breath or wheezing.)  . APPLE CIDER VINEGAR PO Take 1 capsule by mouth daily.  . Ascorbic Acid (VITAMIN C PO) Take 1,000 mg by mouth daily.  . cephALEXin (KEFLEX) 500 MG capsule Take 1 capsule (500 mg total) by mouth 2 (two) times daily.  . cetirizine (ZYRTEC) 10 MG tablet TAKE 1 TABLET (10 MG TOTAL) BY MOUTH DAILY. NO THERAPEUTIC SUBSTITUTION (Patient taking differently: Take 10 mg by mouth daily.)  . clonazePAM (KLONOPIN) 0.5 MG tablet TAKE 1 TABLET BY MOUTH TWICE A DAY AS NEEDED FOR ANXIETY  . Cyanocobalamin (VITAMIN B12 PO) Take 1 tablet by mouth daily.  [name] docusate sodium (COLACE) 100 MG capsule Take 1 capsule (100 mg total) by mouth 2 (two) times daily as needed for mild constipation or moderate constipation.  . ferrous sulfate 325 (65 FE) MG tablet Take 1 tablet (325 mg total) by mouth daily.  . fluticasone (FLONASE) 50 MCG/ACT nasal spray Place 2 sprays into both nostrils daily. (Patient taking differently: Place 2 sprays into both nostrils daily as needed for allergies.)  . gabapentin (NEURONTIN) 300 MG capsule Take 300 mg by mouth 3 (three) times daily as needed (pain).  [name] guaiFENesin (ROBITUSSIN) 100 MG/5ML SOLN Take 15 mLs by mouth daily as needed for cough or to loosen phlegm.  . Menthol 10 MG LOZG Take 1 lozenge by mouth daily as needed (cough).  . montelukast (SINGULAIR) 10 MG tablet TAKE 1 TABLET BY MOUTH EVERYDAY AT BEDTIME (Patient taking differently: Take 10 mg by mouth at  bedtime.)  . mycophenolate (CELLCEPT) 500 MG tablet Take 1,000 mg by mouth 2 (two) times daily.  [name] omeprazole (PRILOSEC) 20 MG capsule Take 1 capsule (20 mg total) by mouth daily. Please schedule a yearly follow up visit for further refills. Thank you  . oxyCODONE (OXY IR/ROXICODONE) 5 MG immediate release tablet Take 5 mg by mouth every 6 (six) hours as needed for moderate pain.  [name] pyridostigmine (MESTINON) 60 MG tablet Take 0.5 tablets (30 mg  total) by mouth 3 (three) times daily.  . sertraline (ZOLOFT) 100 MG tablet Take 1 tablet (100 mg total) by mouth daily.  [name] VITAMIN D, CHOLECALCIFEROL, PO Take 1 tablet by mouth daily. otc  . Vitamin D, Ergocalciferol, (DRISDOL) 1.25 MG (50000 UNIT) CAPS capsule Take 50,000 Units by mouth once a week.  . WEGOVY 0.25 MG/0.5ML SOAJ SMARTSIG:0.5 Milliliter(s) SUB-Q Once a Week   No facility-administered encounter medications on file as of 06/18/2020.    Patient Active Problem List   Diagnosis Date Noted  . Dyspnea on exertion 05/03/2020  . Allergic rhinosinusitis 04/05/2020  . Abdominal pain 08/26/2019  . Hip pain, bilateral 08/26/2019  . Positive ANA (antinuclear antibody) 08/26/2019  . Tremor 07/19/2019  . History of colonic polyps   . Epigastric pain   . Early satiety   . Macromastia 06/17/2019  . Seasonal allergies 02/18/2019  . GAD (generalized anxiety disorder) 08/06/2018  . Iron deficiency anemia 07/31/2017  . Pre-diabetes 07/31/2017  . Osteoarthritis of left hip 02/18/2017  . OSA on CPAP 09/11/2016  . Restrictive lung disease 10/11/2015  . Healthcare maintenance 09/14/2015  . Major depression   . Headache, migraine   . Shortness of breath   . Splenomegaly 04/07/2015  . Myasthenia gravis ([place])   . Osteoarthritis of left knee 12/02/2013  . Benign neoplasm of descending colon 09/19/2013  . Osteoarthritis of right knee 08/30/2013  . GERD (gastroesophageal reflux disease) 06/23/2013  . Morbid obesity ([place]) 06/23/2013  . Fibroids 06/23/2013    Conditions to be addressed/monitored: Weight Loss  Care Plan : RN Case Manager  Updates made by [name], RN since 06/18/2020 12:00 AM  Problem: Weight Management (Obesity)   Long-Range Goal: Weight Loss   Start Date: 03/22/2019  Expected End Date: 08/30/2020  Recent Progress: On track  Priority: Medium  Note:   Current Barriers:  [name] Knowledge Deficits related to weight loss strategies as evidenced by weight gain reported  weight 315 lbs. . Chronic Disease Management support and education needs related to weight management in patient with OSA/Restrictive Lung Diease Nurse Case Manager Clinical Goal(s):  [name] Over the next 90 days, patient will verbalize basic understanding of plan for initiation of weight loss strategies . Over the next 90 days, patient will demonstrate improved health management independence as evidenced by keeping daily food log/diary and increasing daily water intake (patient does not have provider prescribed fluid restriction) . Over the next 30 days, patient will work with RN Case Manager to address needs related to any barriers that she may have related to weight loss  Interventions:  Evaluation of current treatment plan related to weight loss and patient's adherence to plan as established by provider . Provided verbal and/or written education to patient re: recommended life style changes: avoid fad diets, make small/incremental dietary and light exercise changes( IE.., walk around her kitchen table, sit in a chair and exercise her arms with  small water bottles for a few minutes a day.), eat at the table and avoid eating in  front of the TV, plan management of cravings, monitor snacking and cravings in food diary . Discussed plans with patient for ongoing care management follow up and provided patient with direct contact information for care management team . Reduces sodium in her diet, discussed the effects of sodium on the body. . Weigh monthly . Be realistic on weight to lose.  Patient states 1-2 lbs to start. [name] activation or motivation to change monitored . activity and exercise based on tolerance encouraged . barriers to physical activity or exercise addressed . encouragement and support provided . participation in weight loss program or support group encouraged . success praised . Continue toi monitor food and fluid intake .  Taking her medications as prescribed  . Patient reports that  she is feeling better.  She reports that she is taking Wegovy  shots for weight loss.  She has been doing this for 5 weeks.  She does not have much of an appetite. She is eating boiled eggs, salads, applesause, and drinking smoothies.  She states she is walking and trying to do 5-6 thousand steps a day.  . Patient was contacted regarding her CPAP and she just needs to pick it up. Patient Goals/Self Care Activities:  . Performs ADL's independently . Calls provider office for new concerns or questions        [name] RN, BSN, [facility] Care Management Coordinator [place] Phone: [phone] I Fax: [phone]

## 2020-06-18 NOTE — Patient Instructions (Signed)
Visit Information  Ms. Monterosso  it was nice speaking with you. Please call me directly [phone] if you have questions about the goals we discussed.  Goals Addressed            This Visit's Progress   . Develop a Weight Loss Readiness Plan       Timeframe:  Long-Range Goal Priority:  Medium Start Date:      02/13/20                       Expected End Date:    08/30/20    Patient Goals/Self Care Activities:  . Performs ADL's independently . Calls provider office for new concerns or questions . Move as tolerated and rest when needed . Take medications as prescribed       Why is this important?    Losing weight requires time to prepare your mind and your home.   Identify your eating habits and the emotions attached to eating.   Think about ways to be successful.   When you are not ready, you could lose motivation and give up on your plan.     Notes:        The patient verbalized understanding of instructions, educational materials, and care plan provided today and declined offer to receive copy of patient instructions, educational materials, and care plan.   Follow up Plan: Patient would like continued follow-up.  CCM RNCM  will outreach the patient within the next 5 Weeks.  Patient will call office if needed prior to next encounter  [name], RN  [phone]

## 2020-06-27 ENCOUNTER — Telehealth (INDEPENDENT_AMBULATORY_CARE_PROVIDER_SITE_OTHER): Payer: BC Managed Care – PPO | Admitting: Internal Medicine

## 2020-06-27 ENCOUNTER — Encounter: Payer: Self-pay | Admitting: Internal Medicine

## 2020-06-27 VITALS — Ht 60.0 in | Wt 308.0 lb

## 2020-06-27 DIAGNOSIS — G4733 Obstructive sleep apnea (adult) (pediatric): Secondary | ICD-10-CM

## 2020-06-27 DIAGNOSIS — R079 Chest pain, unspecified: Secondary | ICD-10-CM | POA: Diagnosis not present

## 2020-06-27 DIAGNOSIS — I10 Essential (primary) hypertension: Secondary | ICD-10-CM

## 2020-06-27 DIAGNOSIS — R0602 Shortness of breath: Secondary | ICD-10-CM

## 2020-06-27 DIAGNOSIS — G7000 Myasthenia gravis without (acute) exacerbation: Secondary | ICD-10-CM

## 2020-06-27 DIAGNOSIS — Z9989 Dependence on other enabling machines and devices: Secondary | ICD-10-CM

## 2020-06-27 NOTE — Patient Instructions (Signed)
Medication Instructions:  No Changes In Medications at this time.  *If you need a refill on your cardiac medications before your next appointment, please call your pharmacy*  Follow-Up: At [facility], you and your health needs are our priority.  As part of our continuing mission to provide you with exceptional heart care, we have created designated Provider Care Teams.  These Care Teams include your primary Cardiologist (physician) and Advanced Practice Providers (APPs -  Physician Assistants and Nurse Practitioners) who all work together to provide you with the care you need, when you need it.  Your next appointment:   1 year(s)  The format for your next appointment:   In Person  Provider:   Gayatri Acharya, MD  

## 2020-06-27 NOTE — Progress Notes (Signed)
Virtual Visit via Video Note   This visit type was conducted due to national recommendations for restrictions regarding the COVID-19 Pandemic (e.g. social distancing) in an effort to limit this patient's exposure and mitigate transmission in our community.  Due to her co-morbid illnesses, this patient is at least at moderate risk for complications without adequate follow up.  This format is felt to be most appropriate for this patient at this time.  All issues noted in this document were discussed and addressed.  A limited physical exam was performed with this format.  Please refer to the patient's chart for her consent to telehealth for [facility].  Date:  06/27/2020   ID:  Frances Medina, DOB 1960/06/03, MRN [id number] The patient was identified using 2 identifiers.  Patient Location: Home Provider Location: Office/Clinic  PCP:  [name], MD   [place]  Cardiologist:  No primary care provider on file.  Advanced Practice Provider:  No care team member to display Electrophysiologist:  None   Evaluation Performed:  Follow-Up Visit  Chief Complaint:  Follow up  History of Present Illness:    Frances Medina is a 60 y.o. female with myasthenia gravis on mycophenolate therapy, morbid obesity, obstructive sleep apnea, restrictive lung disease by pulmonary function testing likely secondary to obesity, and anxiety. Epigastric pain gone after stopping ibuprofen. Stress test low risk.   Abnormality noted on stress most likely due to artifact given body habitus.  Recently hospitalized for acute dyspnea on exertion, worked up for myasthenic crisis, but felt to be low suspicion for that by neurology.  Started on pyridostigmine with improvement in symptoms.  Cardiovascular evaluation was unremarkable at the time of hospitalization, and felt to be noncontributory to her presentation.  She is feeling well today and remains in good spirits as always.  She has a  remarkable positive attitude.  Epigastric pain has not come back and we both feel it was related to increased ibuprofen use due to joint pain.  Shortness of breath has improved as well, and overall she has no concerns.  Was also evaluated at [facility] for weight loss surgery.  As she tells me, during laparoscopic evaluation, her liver was felt to be too large for visualization of all anatomic structures needed for successful gastric bypass, and surgery was deferred.  She does not intend to pursue this again.  The patient does not have symptoms concerning for COVID-19 infection (fever, chills, cough, or new shortness of breath).    Past Medical History:  Diagnosis Date  . Acute respiratory failure ([place])   . Acute respiratory failure with hypoxemia ([place])   . Aspiration into airway   . Back pain 07/31/2017  . Benign neoplasm of rectum   . CAP (community acquired pneumonia) 08/05/2015  . Depression with anxiety   . Diverticulosis   . Dysphagia 03/2015    EGD, Dr Carlean Purl. mild antral gastritis, ? due to Ibuprofen.  no stricture but empirically maloney dilated esophagus.   [name] Dysphagia, neurologic   . E. coli UTI 04/07/2015  . Elevated LDH 05/06/2015  . Encounter for routine gynecological examination 10/19/2015  . Endotracheally intubated   . Enteritis due to Clostridium difficile   . Esophageal dysmotility 11/08/2015  . Fibroid uterus    size of a dime  . Gastrostomy infection ([place]) 11/08/2015  . Gastrostomy infection ([place]) 11/08/2015  . GERD (gastroesophageal reflux disease)    hiatal hernia  . Hypertension    "went away when  I stopped smoking"  . Knee injury   . Left hip pain 02/18/2017  . Migraine    "maybe couple times/month" (03/20/2015)  . Mild intermittent asthma 06/22/2017  . Myasthenia gravis ([place]) 2017  . Myasthenia gravis with acute exacerbation ([place]) 05/15/2015  . Osteoarthritis of left knee 12/02/2013  . Osteoarthritis of right knee 08/30/2013  . Pancreatitis 07/25/2017  . Protein-calorie  malnutrition, severe ([place]) 08/07/2015  . Seasonal allergies    takes Zytrec  . Tracheostomy status ([place])   . Tubular adenoma of colon   . Tumors    "in my stomach"  . Umbilical hernia    watching , no plans for surgery at present  . Urine incontinence 10/19/2015  . VAP (ventilator-associated pneumonia) [facility])    Past Surgical History:  Procedure Laterality Date  . BIOPSY  07/05/2019   Procedure: BIOPSY;  Surgeon: Ladene Artist, MD;  Location: [facility];  Service: Endoscopy;;  . [place]; 1989  . COLONOSCOPY WITH PROPOFOL N/A 09/19/2013   Procedure: COLONOSCOPY WITH PROPOFOL;  Surgeon: Ladene Artist, MD;  Location: [facility] ENDOSCOPY;  Service: Endoscopy;  Laterality: N/A;  . COLONOSCOPY WITH PROPOFOL N/A 07/05/2019   Procedure: COLONOSCOPY WITH PROPOFOL;  Surgeon: Ladene Artist, MD;  Location: [facility] ENDOSCOPY;  Service: Endoscopy;  Laterality: N/A;  . DILATION AND CURETTAGE OF UTERUS    . ESOPHAGOGASTRODUODENOSCOPY N/A 08/10/2015   Procedure: ESOPHAGOGASTRODUODENOSCOPY (EGD);  Surgeon: Gatha Mayer, MD;  Location[facility] END[facility];  Service: Endoscopy;  Laterality: N/A;  . ESOPHAGOGASTRODUODENOSCOPY (EGD) WITH PROPOFOL N/A 03/21/2015   Procedure: ESOPHAGOGASTRODUODENOSCOPY (EGD) WITH PROPOFOL;  Surgeon: Gatha Mayer, MD;  Location: [place];  Service: Endoscopy;  Laterality: N/A;  . ESOPHAGOGASTRODUODENOSCOPY (EGD) WITH PROPOFOL N/A 07/05/2019   Procedure: ESOPHAGOGASTRODUODENOSCOPY (EGD) WITH PROPOFOL;  Surgeon: Ladene Artist, MD;  Location: [facility] ENDOS[facility];  Service: Endoscopy;  Laterality: N/A;  . PARTIAL KNEE ARTHROPLASTY Right 08/30/2013   Procedure: RIGHT UNICOMPARTMENTAL KNEE;  Surgeon: Johnny Bridge, MD;  Location: [place];  Service: Orthopedics;  Laterality: Right;  . PARTIAL KNEE ARTHROPLASTY Left 12/02/2013   Procedure: LEFT KNEE UNI ARTHROPLASTY;  Surgeon: Johnny Bridge, MD;  Location: [place];  Service: Orthopedics;  Laterality: Left;  . PEG  PLACEMENT N/A 08/10/2015   Procedure: PERCUTANEOUS ENDOSCOPIC GASTROSTOMY (PEG) PLACEMENT;  Surgeon: Gatha Mayer, MD;  Location: [place];  Service: Endoscopy;  Laterality: N/A;  . POLYPECTOMY  07/05/2019   Procedure: POLYPECTOMY;  Surgeon: Ladene Artist, MD;  Location: [facility] ENDOSCOPY;  Service: Endoscopy;;  . TUBAL LIGATION  1989  . VAGINAL HYSTERECTOMY  1990's?   "apparently took out one of my ovaries at the time too cause one's missing"     Current Meds  Medication Sig  . acetaminophen (TYLENOL) 500 MG tablet Take 500 mg by mouth in the morning and at bedtime.  [name] albuterol (PROVENTIL) (2.5 MG/3ML) 0.083% nebulizer solution USE 1 VIAL IN NEBULIZER EVERY 6 HOURS AS NEEDED FOR SHORTNESS OF BREATH AND WHEEZING  . albuterol (VENTOLIN HFA) 108 (90 Base) MCG/ACT inhaler INHALE 2 PUFFS INTO THE LUNGS EVERY 6 HOURS AS NEEDED FOR WHEEZE OR SHORTNESS OF BREATH (Patient taking differently: Inhale into the lungs every 6 (six) hours as needed for shortness of breath or wheezing.)  . APPLE CIDER VINEGAR PO Take 1 capsule by mouth daily.  . Ascorbic Acid (VITAMIN C PO) Take 1,000 mg by mouth daily.  . cetirizine (ZYRTEC) 10 MG tablet TAKE 1 TABLET (10 MG TOTAL)  BY MOUTH DAILY. NO THERAPEUTIC SUBSTITUTION (Patient taking differently: Take 10 mg by mouth daily.)  . clonazePAM (KLONOPIN) 0.5 MG tablet TAKE 1 TABLET BY MOUTH TWICE A DAY AS NEEDED FOR ANXIETY  . Cyanocobalamin (VITAMIN B12 PO) Take 1 tablet by mouth daily.  [name] docusate sodium (COLACE) 100 MG capsule Take 1 capsule (100 mg total) by mouth 2 (two) times daily as needed for mild constipation or moderate constipation.  . ferrous sulfate 325 (65 FE) MG tablet Take 1 tablet (325 mg total) by mouth daily.  . fluticasone (FLONASE) 50 MCG/ACT nasal spray Place 2 sprays into both nostrils daily. (Patient taking differently: Place 2 sprays into both nostrils daily as needed for allergies.)  . gabapentin (NEURONTIN) 300 MG capsule Take 300 mg by mouth 3  (three) times daily as needed (pain).  [name] guaiFENesin (ROBITUSSIN) 100 MG/5ML SOLN Take 15 mLs by mouth daily as needed for cough or to loosen phlegm.  . Menthol 10 MG LOZG Take 1 lozenge by mouth daily as needed (cough).  . montelukast (SINGULAIR) 10 MG tablet TAKE 1 TABLET BY MOUTH EVERYDAY AT BEDTIME (Patient taking differently: Take 10 mg by mouth at bedtime.)  . mycophenolate (CELLCEPT) 500 MG tablet Take 1,000 mg by mouth 2 (two) times daily.  [name] omeprazole (PRILOSEC) 20 MG capsule Take 1 capsule (20 mg total) by mouth daily. Please schedule a yearly follow up visit for further refills. Thank you  . oxyCODONE (OXY IR/ROXICODONE) 5 MG immediate release tablet Take 5 mg by mouth every 6 (six) hours as needed for moderate pain.  [name] sertraline (ZOLOFT) 100 MG tablet Take 1 tablet (100 mg total) by mouth daily.  [name] VITAMIN D, CHOLECALCIFEROL, PO Take 1 tablet by mouth daily. otc  . Vitamin D, Ergocalciferol, (DRISDOL) 1.25 MG (50000 UNIT) CAPS capsule Take 50,000 Units by mouth once a week.  . WEGOVY 0.25 MG/0.5ML SOAJ SMARTSIG:0.5 Milliliter(s) SUB-Q Once a Week     Allergies:   Ciprofloxacin, Naproxen, Shrimp [shellfish allergy], Dicyclomine, Methocarbamol, Pineapple, and Morphine and related   Social History   Tobacco Use  . Smoking status: Former Smoker    Packs/day: 1.50    Years: 38.00    Pack years: 57.00    Types: Cigarettes    Start date: 03/03/1974    Quit date: 05/14/2012    Years since quitting: 8.1  . Smokeless tobacco: Never Used  . Tobacco comment: denies thoughts of restarting  Vaping Use  . Vaping Use: Former  Substance Use Topics  . Alcohol use: No    Alcohol/week: 0.0 standard drinks  . Drug use: No     Family Hx: The patient's family history includes Anemia in her sister; COPD in her brother; Heart disease (age of onset: 6) in her mother; Hypertension in her father and mother; Leukemia in her sister and sister; Other in her sister; Stroke in her father. There is no  history of Colon cancer.  ROS:   Please see the history of present illness.     All other systems reviewed and are negative.   Prior CV studies:   The following studies were reviewed today:    Labs/Other Tests and Data Reviewed:    EKG:  No ECG reviewed.  Recent Labs: 05/02/2020: ALT 14; B Natriuretic Peptide 198.9 05/05/2020: BUN 14; Creatinine, Ser 0.66; Hemoglobin 11.8; Platelets 141; Potassium 4.1; Sodium 141   Recent Lipid Panel Lab Results  Component Value Date/Time   CHOL 161 07/25/2017 06:46 PM   TRIG 44  07/25/2017 06:46 PM   HDL 60 07/25/2017 06:46 PM   CHOLHDL 2.7 07/25/2017 06:46 PM   LDLCALC 92 07/25/2017 06:46 PM    Wt Readings from Last 3 Encounters:  06/27/20 (!) 308 lb (139.7 kg)  05/05/20 (!) 311 lb 9.6 oz (141.3 kg)  12/19/19 (!) 320 lb (145.2 kg)     Risk Assessment/Calculations:      Objective:    Vital Signs:  Ht 5' (1.524 m)   Wt (!) 308 lb (139.7 kg)   BMI 60.15 kg/m    VITAL SIGNS:  reviewed GEN:  no acute distress EYES:  sclerae anicteric, EOMI - Extraocular Movements Intact RESPIRATORY:  normal respiratory effort, symmetric expansion CARDIOVASCULAR:  no peripheral edema SKIN:  no rash, lesions or ulcers. MUSCULOSKELETAL:  no obvious deformities. NEURO:  alert and oriented x 3, no obvious focal deficit PSYCH:  normal affect  ASSESSMENT & PLAN:    1. Chest pain, unspecified type   2. Morbid obesity ([place])   3. Essential hypertension   4. OSA on CPAP   5. Myasthenia gravis ([place])   6. Shortness of breath    Chest pain -We will recurrence since stopping ibuprofen, symptoms are overall stable.  Dyspnea-improved with initiation of pyridostigmine.  OSA-continue CPAP.  COVID-19 Education: The signs and symptoms of COVID-19 were discussed with the patient and how to seek care for testing (follow up with PCP or arrange E-visit).  The importance of social distancing was discussed today.  Time:   Today, I have spent 15 minutes with  the patient with telehealth technology discussing the above problems.     Medication Adjustments/Labs and Tests Ordered: Current medicines are reviewed at length with the patient today.  Concerns regarding medicines are outlined above.   Patient Instructions  Medication Instructions:  No Changes In Medications at this time.  *If you need a refill on your cardiac medications before your next appointment, please call your pharmacy*  Follow-Up: At [facility], you and your health needs are our priority.  As part of our continuing mission to provide you with exceptional heart care, we have created designated Provider Care Teams.  These Care Teams include your primary Cardiologist (physician) and Advanced Practice Providers (APPs -  Physician Assistants and Nurse Practitioners) who all work together to provide you with the care you need, when you need it.  Your next appointment:   1 year(s)  The format for your next appointment:   In Person  Provider:   Cherlynn Kaiser, MD    Signed, Elouise Munroe, MD  06/27/2020 9:16 AM    [place]

## 2020-07-13 ENCOUNTER — Other Ambulatory Visit: Payer: Self-pay | Admitting: Gastroenterology

## 2020-07-23 ENCOUNTER — Telehealth: Payer: Medicare Other

## 2020-07-26 ENCOUNTER — Ambulatory Visit
Admission: RE | Admit: 2020-07-26 | Discharge: 2020-07-26 | Disposition: A | Discharge status: Home / Self Care | Payer: BC Managed Care – PPO | Source: Ambulatory Visit | Attending: Family Medicine | Admitting: Family Medicine

## 2020-07-26 ENCOUNTER — Other Ambulatory Visit: Payer: Self-pay

## 2020-07-26 DIAGNOSIS — Z1231 Encounter for screening mammogram for malignant neoplasm of breast: Secondary | ICD-10-CM

## 2020-08-01 ENCOUNTER — Ambulatory Visit: Payer: BC Managed Care – PPO

## 2020-08-01 NOTE — Patient Instructions (Signed)
Visit Information  Ms. Chapple  it was nice speaking with you. Please call me directly [phone] if you have questions about the goals we discussed.  Goals Addressed            This Visit's Progress   . Develop a Weight Loss Readiness Plan       Timeframe:  Long-Range Goal Priority:  Medium Start Date:      02/13/20                       Expected End Date:   11/30/20  Patient Goals/Self Care Activities:  . Performs ADL's independently . Calls provider office for new concerns or questions . Move as tolerated and rest when needed . Take medications as prescribed       Why is this important?    Losing weight requires time to prepare your mind and your home.   Identify your eating habits and the emotions attached to eating.   Think about ways to be successful.   When you are not ready, you could lose motivation and give up on your plan.     Notes:        The patient verbalized understanding of instructions, educational materials, and care plan provided today and declined offer to receive copy of patient instructions, educational materials, and care plan.   Follow up Plan: Patient would like continued follow-up.  CCM CMRN will outreach the patient within the next 8 weeks  Patient will call office if needed prior to next encounter  [name], RN  [phone]

## 2020-08-01 NOTE — Chronic Care Management (AMB) (Signed)
Care Management    RN Visit Note  08/01/2020 Name: Frances Medina MRN: [id number] DOB: 15-Dec-1960  Subjective: Frances Medina is a 60 y.o. year old female who is a primary care patient of [name], MD. The care management team was consulted for assistance with disease management and care coordination needs.    Engaged with patient by telephone for follow up visit in response to provider referral for case management and/or care coordination services.   Consent to Services:   Frances Medina was given information about Care Management services today including:  1. Care Management services includes personalized support from designated clinical staff supervised by her physician, including individualized plan of care and coordination with other care providers 2. 24/7 contact phone numbers for assistance for urgent and routine care needs. 3. The patient may stop case management services at any time by phone call to the office staff.  Patient agreed to services and consent obtained.    Assessment: Patient is making progress with weight loss . See Care Plan below for interventions and patient self-care actives. Follow up Plan: Patient would like continued follow-up.  CCM RNCM will outreach the patient within the next 8 weeks  Patient will call office if needed prior to next encounterReview of patient past medical history, allergies, medications, health status, including review of consultants reports, laboratory and other test data, was performed as part of comprehensive evaluation and provision of chronic care management services.   SDOH (Social Determinants of Health) assessments and interventions performed:    Care Plan  Allergies  Allergen Reactions  . Ciprofloxacin Shortness Of Breath and Rash  . Naproxen Shortness Of Breath  . Shrimp [Shellfish Allergy] Hives and Shortness Of Breath    ER required  . Dicyclomine Hives  . Methocarbamol Hives  . Pineapple Itching    Itchy lips and  tongue  . Morphine And Related Other (See Comments)    Severe headache    Outpatient Encounter Medications as of 08/01/2020  Medication Sig  . acetaminophen (TYLENOL) 500 MG tablet Take 500 mg by mouth in the morning and at bedtime.  [name] albuterol (PROVENTIL) (2.5 MG/3ML) 0.083% nebulizer solution USE 1 VIAL IN NEBULIZER EVERY 6 HOURS AS NEEDED FOR SHORTNESS OF BREATH AND WHEEZING  . albuterol (VENTOLIN HFA) 108 (90 Base) MCG/ACT inhaler INHALE 2 PUFFS INTO THE LUNGS EVERY 6 HOURS AS NEEDED FOR WHEEZE OR SHORTNESS OF BREATH (Patient taking differently: Inhale into the lungs every 6 (six) hours as needed for shortness of breath or wheezing.)  . APPLE CIDER VINEGAR PO Take 1 capsule by mouth daily.  . Ascorbic Acid (VITAMIN C PO) Take 1,000 mg by mouth daily.  . cetirizine (ZYRTEC) 10 MG tablet TAKE 1 TABLET (10 MG TOTAL) BY MOUTH DAILY. NO THERAPEUTIC SUBSTITUTION (Patient taking differently: Take 10 mg by mouth daily.)  . clonazePAM (KLONOPIN) 0.5 MG tablet TAKE 1 TABLET BY MOUTH TWICE A DAY AS NEEDED FOR ANXIETY  . Cyanocobalamin (VITAMIN B12 PO) Take 1 tablet by mouth daily.  [name] docusate sodium (COLACE) 100 MG capsule Take 1 capsule (100 mg total) by mouth 2 (two) times daily as needed for mild constipation or moderate constipation.  . ferrous sulfate 325 (65 FE) MG tablet Take 1 tablet (325 mg total) by mouth daily.  . fluticasone (FLONASE) 50 MCG/ACT nasal spray Place 2 sprays into both nostrils daily. (Patient taking differently: Place 2 sprays into both nostrils daily as needed for allergies.)  . gabapentin (NEURONTIN) 300 MG  capsule Take 300 mg by mouth 3 (three) times daily as needed (pain).  [name] guaiFENesin (ROBITUSSIN) 100 MG/5ML SOLN Take 15 mLs by mouth daily as needed for cough or to loosen phlegm.  . Menthol 10 MG LOZG Take 1 lozenge by mouth daily as needed (cough).  . montelukast (SINGULAIR) 10 MG tablet TAKE 1 TABLET BY MOUTH EVERYDAY AT BEDTIME (Patient taking differently: Take 10 mg  by mouth at bedtime.)  . mycophenolate (CELLCEPT) 500 MG tablet Take 1,000 mg by mouth 2 (two) times daily.  [name] omeprazole (PRILOSEC) 20 MG capsule TAKE 1 CAPSULE BY MOUTH DAILY. PLEASE SCHEDULE A YEARLY FOLLOW UP VISIT FOR FURTHER REFILLS.  [name] oxyCODONE (OXY IR/ROXICODONE) 5 MG immediate release tablet Take 5 mg by mouth every 6 (six) hours as needed for moderate pain.  [name] pyridostigmine (MESTINON) 60 MG tablet Take 0.5 tablets (30 mg total) by mouth 3 (three) times daily.  . sertraline (ZOLOFT) 100 MG tablet Take 1 tablet (100 mg total) by mouth daily.  [name] VITAMIN D, CHOLECALCIFEROL, PO Take 1 tablet by mouth daily. otc  . Vitamin D, Ergocalciferol, (DRISDOL) 1.25 MG (50000 UNIT) CAPS capsule Take 50,000 Units by mouth once a week.  . WEGOVY 0.25 MG/0.5ML SOAJ SMARTSIG:0.5 Milliliter(s) SUB-Q Once a Week   No facility-administered encounter medications on file as of 08/01/2020.    Patient Active Problem List   Diagnosis Date Noted  . Dyspnea on exertion 05/03/2020  . Allergic rhinosinusitis 04/05/2020  . Abdominal pain 08/26/2019  . Hip pain, bilateral 08/26/2019  . Positive ANA (antinuclear antibody) 08/26/2019  . Tremor 07/19/2019  . History of colonic polyps   . Epigastric pain   . Early satiety   . Macromastia 06/17/2019  . Seasonal allergies 02/18/2019  . GAD (generalized anxiety disorder) 08/06/2018  . Iron deficiency anemia 07/31/2017  . Pre-diabetes 07/31/2017  . Osteoarthritis of left hip 02/18/2017  . OSA on CPAP 09/11/2016  . Restrictive lung disease 10/11/2015  . Healthcare maintenance 09/14/2015  . Major depression   . Headache, migraine   . Shortness of breath   . Splenomegaly 04/07/2015  . Myasthenia gravis ([place])   . Osteoarthritis of left knee 12/02/2013  . Benign neoplasm of descending colon 09/19/2013  . Osteoarthritis of right knee 08/30/2013  . GERD (gastroesophageal reflux disease) 06/23/2013  . Morbid obesity ([place]) 06/23/2013  . Fibroids 06/23/2013     Conditions to be addressed/monitored: weight loss  Care Plan : RN Case Manager  Updates made by [name], RN since 08/01/2020 12:00 AM    Problem: Weight Management (Obesity)     Long-Range Goal: Weight Loss   Start Date: 03/22/2019  Expected End Date: 08/30/2020  Recent Progress: On track  Priority: Medium  Note:   Current Barriers:  [name] Knowledge Deficits related to weight loss strategies as evidenced by weight gain reported weight 315 lbs. . Chronic Disease Management support and education needs related to weight management in patient with OSA/Restrictive Lung Diease Nurse Case Manager Clinical Goal(s):  [name] Over the next 90 days, patient will verbalize basic understanding of plan for initiation of weight loss strategies . Over the next 90 days, patient will demonstrate improved health management independence as evidenced by keeping daily food log/diary and increasing daily water intake (patient does not have provider prescribed fluid restriction) . Over the next 30 days, patient will work with RN Case Manager to address needs related to any barriers that she may have related to weight loss  Interventions:  Evaluation of  current treatment plan related to weight loss and patient's adherence to plan as established by provider . Provided verbal and/or written education to patient re: recommended life style changes: avoid fad diets, make small/incremental dietary and light exercise changes( IE.., walk around her kitchen table, sit in a chair and exercise her arms with  small water bottles for a few minutes a day.), eat at the table and avoid eating in front of the TV, plan management of cravings, monitor snacking and cravings in food diary . Discussed plans with patient for ongoing care management follow up and provided patient with direct contact information for care management team . Reduces sodium in her diet, discussed the effects of sodium on the body. . Weigh monthly . Be realistic  on weight to lose.  Patient states she has lost weight, however she has not weighed herself.  Patient's clothes are "falling off her" and she can tell her by the excess skin hanging down. [name] activation or motivation to change monitored . activity and exercise based on tolerance encouraged . barriers to physical activity or exercise addressed . encouragement and support provided . participation in weight loss program or support group encouraged . success praised . Continue toi monitor food and fluid intake .  Taking her medications as prescribed  . Patient reports that she is feeling better.  She reports that she was taken off the [facility] and as been switched to [place].  She does not have much of an appetite and has nausea. Patient Goals/Self Care Activities:  . Performs ADL's independently . Calls provider office for new concerns or questions        Plan: Telephone follow up appointment with care management team member scheduled for:  8 weeks [name] RN, BSN, [facility] Care Management Coordinator [place] Phone: [phone] I Fax: [phone]

## 2020-08-15 ENCOUNTER — Other Ambulatory Visit: Payer: Self-pay | Admitting: Gastroenterology

## 2020-08-22 ENCOUNTER — Other Ambulatory Visit: Payer: Self-pay | Admitting: Family Medicine

## 2020-08-27 ENCOUNTER — Ambulatory Visit (INDEPENDENT_AMBULATORY_CARE_PROVIDER_SITE_OTHER): Payer: Medicare Other | Admitting: Otolaryngology

## 2020-09-02 ENCOUNTER — Encounter: Payer: Self-pay | Admitting: Family Medicine

## 2020-09-02 ENCOUNTER — Other Ambulatory Visit: Payer: Self-pay | Admitting: Family Medicine

## 2020-09-04 ENCOUNTER — Telehealth: Payer: Self-pay | Admitting: Family Medicine

## 2020-09-04 MED ORDER — SERTRALINE HCL 50 MG PO TABS: 150.0000 mg | ORAL_TABLET | Freq: Every day | ORAL | 1 refills | Status: DC

## 2020-09-04 NOTE — Telephone Encounter (Signed)
I called to discuss her Zoloft. I reviewed her record; it appears that her Zoloft dose was decreased to 100 mg from 150 mg during her last hospitalization. I called her, and she confirmed that she is still on her 150 mg daily regimen and will need a refill. I refilled this med and contacted her pharmacy to clarify. I spoke with Frances Medina and she said her prescription would be ready for pick up soon.

## 2020-09-26 ENCOUNTER — Ambulatory Visit: Payer: BC Managed Care – PPO

## 2020-09-26 NOTE — Chronic Care Management (AMB) (Signed)
Chronic Care Management   CCM RN Visit Note  09/26/2020 Name: Frances Medina MRN: [id number] DOB: Feb 10, 1961  Subjective: Frances Medina is a 60 y.o. year old female who is a primary care patient of [name], MD. The care management team was consulted for assistance with disease management and care coordination needs.    Engaged with patient by telephone for follow up visit in response to provider referral for case management and/or care coordination services.   Consent to Services:  The patient was given information about Chronic Care Management services, agreed to services, and gave verbal consent prior to initiation of services.  Please see initial visit note for detailed documentation.   Patient agreed to services and verbal consent obtained.    Assessment:  The patient is making progress with her weight loss.  She states that she has experienced some shortness of breath but her medication has been helping. . See Care Plan below for interventions and patient self-care actives. Follow up Plan: Patient would like continued follow-up.  CCM RNCM will outreach the patient within the next 4 weeks.  Patient will call office if needed prior to next encounter  Review of patient past medical history, allergies, medications, health status, including review of consultants reports, laboratory and other test data, was performed as part of comprehensive evaluation and provision of chronic care management services.   SDOH (Social Determinants of Health) assessments and interventions performed:    CCM Care Plan  Allergies  Allergen Reactions   Ciprofloxacin Shortness Of Breath and Rash   Naproxen Shortness Of Breath   Shrimp [Shellfish Allergy] Hives and Shortness Of Breath    ER required   Dicyclomine Hives   Methocarbamol Hives   Pineapple Itching    Itchy lips and tongue   Morphine And Related Other (See Comments)    Severe headache    Outpatient Encounter Medications as of  09/26/2020  Medication Sig   acetaminophen (TYLENOL) 500 MG tablet Take 500 mg by mouth in the morning and at bedtime.   albuterol (PROVENTIL) (2.5 MG/3ML) 0.083% nebulizer solution USE 1 VIAL IN NEBULIZER EVERY 6 HOURS AS NEEDED FOR SHORTNESS OF BREATH AND WHEEZING   albuterol (VENTOLIN HFA) 108 (90 Base) MCG/ACT inhaler INHALE 2 PUFFS INTO THE LUNGS EVERY 6 HOURS AS NEEDED FOR WHEEZE OR SHORTNESS OF BREATH (Patient taking differently: Inhale into the lungs every 6 (six) hours as needed for shortness of breath or wheezing.)   APPLE CIDER VINEGAR PO Take 1 capsule by mouth daily.   Ascorbic Acid (VITAMIN C PO) Take 1,000 mg by mouth daily.   cetirizine (ZYRTEC) 10 MG tablet TAKE 1 TABLET (10 MG TOTAL) BY MOUTH DAILY. NO THERAPEUTIC SUBSTITUTION (Patient taking differently: Take 10 mg by mouth daily.)   clonazePAM (KLONOPIN) 0.5 MG tablet TAKE 1 TABLET BY MOUTH TWICE A DAY AS NEEDED FOR ANXIETY   Cyanocobalamin (VITAMIN B12 PO) Take 1 tablet by mouth daily.   docusate sodium (COLACE) 100 MG capsule Take 1 capsule (100 mg total) by mouth 2 (two) times daily as needed for mild constipation or moderate constipation.   ferrous sulfate 325 (65 FE) MG tablet Take 1 tablet (325 mg total) by mouth daily.   fluticasone (FLONASE) 50 MCG/ACT nasal spray Place 2 sprays into both nostrils daily. (Patient taking differently: Place 2 sprays into both nostrils daily as needed for allergies.)   gabapentin (NEURONTIN) 300 MG capsule Take 300 mg by mouth 3 (three) times daily as needed (pain).  guaiFENesin (ROBITUSSIN) 100 MG/5ML SOLN Take 15 mLs by mouth daily as needed for cough or to loosen phlegm.   Menthol 10 MG LOZG Take 1 lozenge by mouth daily as needed (cough).   montelukast (SINGULAIR) 10 MG tablet TAKE 1 TABLET BY MOUTH EVERYDAY AT BEDTIME (Patient taking differently: Take 10 mg by mouth at bedtime.)   mycophenolate (CELLCEPT) 500 MG tablet Take 1,000 mg by mouth 2 (two) times daily.   omeprazole (PRILOSEC)  20 MG capsule TAKE 1 CAPSULE BY MOUTH DAILY. PLEASE SCHEDULE A YEARLY FOLLOW UP VISIT FOR FURTHER REFILLS.   sertraline (ZOLOFT) 50 MG tablet Take 3 tablets (150 mg total) by mouth daily.   VITAMIN D, CHOLECALCIFEROL, PO Take 1 tablet by mouth daily. otc   Vitamin D, Ergocalciferol, (DRISDOL) 1.25 MG (50000 UNIT) CAPS capsule Take 50,000 Units by mouth once a week.   oxyCODONE (OXY IR/ROXICODONE) 5 MG immediate release tablet Take 5 mg by mouth every 6 (six) hours as needed for moderate pain. (Patient not taking: Reported on 09/26/2020)   pyridostigmine (MESTINON) 60 MG tablet Take 0.5 tablets (30 mg total) by mouth 3 (three) times daily.   WEGOVY 0.25 MG/0.5ML SOAJ SMARTSIG:0.5 Milliliter(s) SUB-Q Once a Week (Patient not taking: Reported on 09/26/2020)   No facility-administered encounter medications on file as of 09/26/2020.    Patient Active Problem List   Diagnosis Date Noted   Dyspnea on exertion 05/03/2020   Allergic rhinosinusitis 04/05/2020   Abdominal pain 08/26/2019   Hip pain, bilateral 08/26/2019   Positive ANA (antinuclear antibody) 08/26/2019   Tremor 07/19/2019   History of colonic polyps    Epigastric pain    Early satiety    Macromastia 06/17/2019   Seasonal allergies 02/18/2019   GAD (generalized anxiety disorder) 08/06/2018   Iron deficiency anemia 07/31/2017   Pre-diabetes 07/31/2017   Osteoarthritis of left hip 02/18/2017   OSA on CPAP 09/11/2016   Restrictive lung disease 10/11/2015   Healthcare maintenance 09/14/2015   Major depression    Headache, migraine    Shortness of breath    Splenomegaly 04/07/2015   Myasthenia gravis ([place])    Osteoarthritis of left knee 12/02/2013   Benign neoplasm of descending colon 09/19/2013   Osteoarthritis of right knee 08/30/2013   GERD (gastroesophageal reflux disease) 06/23/2013   Morbid obesity ([place]) 06/23/2013   Fibroids 06/23/2013    Conditions to be addressed/monitored: Weight And Restrictive Lung Diease  Care  Plan : RN Case Manager  Updates made by [name], RN since 09/26/2020 12:00 AM     Problem: Weight Management (Obesity)      Long-Range Goal: Weight Loss   Start Date: 03/22/2019  Expected End Date: 08/30/2020  Recent Progress: On track  Priority: Medium  Note:   Current Barriers:  Knowledge Deficits related to weight loss strategies as evidenced by weight gain reported weight 315 lbs. Chronic Disease Management support and education needs related to weight management in patient with OSA/Restrictive Lung Diease Nurse Case Manager Clinical Goal(s):  Over the next 90 days, patient will verbalize basic understanding of plan for initiation of weight loss strategies Over the next 90 days, patient will demonstrate improved health management independence as evidenced by keeping daily food log/diary and increasing daily water intake (patient does not have provider prescribed fluid restriction) Over the next 30 days, patient will work with RN Case Manager to address needs related to any barriers that she may have related to weight loss  Interventions:  Evaluation of current treatment plan  related to weight loss and patient's adherence to plan as established by provider Provided verbal and/or written education to patient re: recommended life style changes: avoid fad diets, make small/incremental dietary and light exercise changes( IE.., walk around her kitchen table, sit in a chair and exercise her arms with  small water bottles for a few minutes a day.), eat at the table and avoid eating in front of the TV, plan management of cravings, monitor snacking and cravings in food diary Discussed plans with patient for ongoing care management follow up and provided patient with direct contact information for care management team Reduces sodium in her diet, discussed the effects of sodium on the body. Weigh monthly Be realistic on weight to lose.  Patient states she has lost weight, however she has not  weighed herself.  Patient's clothes are "falling off her" and she can tell by the way her clothes are fitting. activation or motivation to change monitored activity and exercise based on tolerance encouraged barriers to physical activity or exercise addressed encouragement and support provided participation in weight loss program or support group encouraged success praised Continue toi monitor food and fluid intake  Taking her medications as prescribed  Patient reports that she is feeling better.  She reports that she was taken off the [facility] and as been switched to [place].  She does not have much of an appetite and has nausea.    Patient Goals/Self Care Activities:  Performs ADL's independently Calls provider office for new concerns or questions       Problem: Breathing   Priority: High  Onset Date: 09/26/2020     Long-Range Goal: Patient to monitor and prevent any excaerbation with her breathing   Start Date: 09/26/2020  Expected End Date: 11/30/2020  This Visit's Progress: On track  Priority: High  Note:   Current Barriers:  Knowledge Deficits related to plan of care for management of Restrictive lung diease  Chronic Disease Management support and education needs related to Restrictive lung diease   RNCM Clinical Goal(s):  Patient will verbalize understanding of plan for management of  Restrictive lung diease  take all medications exactly as prescribed and will call provider for medication related questions demonstrate ongoing self health care management ability through collaboration with RN Care manager, provider, and care team.   Interventions: 1:1 collaboration with primary care provider regarding development and update of comprehensive plan of care as evidenced by provider attestation and co-signature Inter-disciplinary care team collaboration (see longitudinal plan of care)     (Status: New goal.) Evaluation of current treatment plan related to Restrictive lung  diease  Discussed plans with patient for ongoing care management follow up and provided patient with direct contact information for care management team Advised patient to continue to take her medications as prescribed, monitor her breathing, and contact her doctor if her breathing becomes worse. Patient notified me that she still has not received her new CPAP machine.  She states that she has been receiving text stateing that it is ready to pick up but when she calls the office she it told it is not ready.  RNCM has sent a Message to the [employer] staff via Epic asking if they can check on the situation for the patient.  Patient Goals/Self-Care Activities: Patient will self administer medications as prescribed Patient will attend all scheduled provider appointments Patient will call pharmacy for medication refills Patient will call provider office for new concerns or questions  Follow Up Plan: Telephone follow up appointment  with care management team member scheduled for: August      Plan:Telephone follow up appointment with care management team member scheduled for:  August   [name] RN, BSN, [facility] Care Management Coordinator [place] Phone: [phone] I Fax: [phone]

## 2020-09-26 NOTE — Patient Instructions (Signed)
Visit Information  Ms. Hehir  it was nice speaking with you. Please call me directly [phone] if you have questions about the goals we discussed.   Goals Addressed               This Visit's Progress     Develop a Weight Loss Readiness Plan   On track     Timeframe:  Long-Range Goal Priority:  Medium Start Date:      02/13/20                       Expected End Date:  11/30/20  Patient Goals/Self Care Activities:  Performs ADL's independently Calls provider office for new concerns or questions Move as tolerated and rest when needed Take medications as prescribed       Why is this important?   Losing weight requires time to prepare your mind and your home.  Identify your eating habits and the emotions attached to eating.  Think about ways to be successful.  When you are not ready, you could lose motivation and give up on your plan.     Notes:       I have had some shortness of breath. (pt-stated)   On track     Timeframe:  Long-Range Goal Priority:  High Start Date:  09/26/20                           Expected End Date:    11/30/20                    Patient will self administer medications as prescribed Patient will attend all scheduled provider appointments Patient will call pharmacy for medication refills Patient will call provider office for new concerns or questions        The patient verbalized understanding of instructions, educational materials, and care plan provided today and declined offer to receive copy of patient instructions, educational materials, and care plan.   Follow up Plan: Patient would like continued follow-up.  CCM RNCM will outreach the patient within the next 4 weeks  Patient will call office if needed prior to next encounter  [name], RN  [phone]

## 2020-09-27 ENCOUNTER — Other Ambulatory Visit: Payer: Self-pay | Admitting: Family Medicine

## 2020-10-08 DIAGNOSIS — Z713 Dietary counseling and surveillance: Secondary | ICD-10-CM | POA: Diagnosis not present

## 2020-10-08 DIAGNOSIS — Z760 Encounter for issue of repeat prescription: Secondary | ICD-10-CM | POA: Diagnosis not present

## 2020-10-08 DIAGNOSIS — Z6841 Body Mass Index (BMI) 40.0 and over, adult: Secondary | ICD-10-CM | POA: Diagnosis not present

## 2020-10-09 ENCOUNTER — Telehealth: Payer: Self-pay

## 2020-10-09 ENCOUNTER — Other Ambulatory Visit: Payer: Self-pay

## 2020-10-09 ENCOUNTER — Ambulatory Visit (INDEPENDENT_AMBULATORY_CARE_PROVIDER_SITE_OTHER): Payer: BC Managed Care – PPO | Admitting: Otolaryngology

## 2020-10-09 DIAGNOSIS — J310 Chronic rhinitis: Secondary | ICD-10-CM

## 2020-10-09 DIAGNOSIS — J358 Other chronic diseases of tonsils and adenoids: Secondary | ICD-10-CM

## 2020-10-09 DIAGNOSIS — H6122 Impacted cerumen, left ear: Secondary | ICD-10-CM

## 2020-10-09 NOTE — Telephone Encounter (Signed)
LVM to have pt call back to schedule AWV.   RE: confirm insurance and schedule AWV on my schedule if times are convenient for patient or other AWV schedule as template permits.   

## 2020-10-09 NOTE — Progress Notes (Signed)
HPI: Frances Medina is a 60 y.o. female who presents is referred by her PCP for evaluation of ear and throat complaints.  Patient has previously been followed by Dr. Ernesto Rutherford several years ago.  She has had a diagnosis of myasthenia gravis several years ago and is doing much better with this following treatment. Today she complains of some ear drainage and fullness.  She is also has some throat complaints where she feels like she gets occasional white debris from her throat. She is obese and working on losing weight and uses nasal CPAP.[name]  Past Medical History:  Diagnosis Date   Acute respiratory failure ([place])    Acute respiratory failure with hypoxemia (HCC)    Aspiration into airway    Back pain 07/31/2017   Benign neoplasm of rectum    CAP (community acquired pneumonia) 08/05/2015   Depression with anxiety    Diverticulosis    Dysphagia 03/2015    EGD, Dr Carlean Purl. mild antral gastritis, ? due to Ibuprofen.  no stricture but empirically maloney dilated esophagus.    Dysphagia, neurologic    E. coli UTI 04/07/2015   Elevated LDH 05/06/2015   Encounter for routine gynecological examination 10/19/2015   Endotracheally intubated    Enteritis due to Clostridium difficile    Esophageal dysmotility 11/08/2015   Fibroid uterus    size of a dime   Gastrostomy infection ([place]) 11/08/2015   Gastrostomy infection ([place]) 11/08/2015   GERD (gastroesophageal reflux disease)    hiatal hernia   Hypertension    "went away when I stopped smoking"   Knee injury    Left hip pain 02/18/2017   Migraine    "maybe couple times/month" (03/20/2015)   Mild intermittent asthma 06/22/2017   Myasthenia gravis ([place]) 2017   Myasthenia gravis with acute exacerbation ([place]) 05/15/2015   Osteoarthritis of left knee 12/02/2013   Osteoarthritis of right knee 08/30/2013   Pancreatitis 07/25/2017   Protein-calorie malnutrition, severe ([place]) 08/07/2015   Seasonal allergies    takes Zytrec   Tracheostomy status ([place])    Tubular adenoma  of colon    Tumors    "in my stomach"   Umbilical hernia    watching , no plans for surgery at present   Urine incontinence 10/19/2015   VAP (ventilator-associated pneumonia) [facility])    Past Surgical History:  Procedure Laterality Date   BIOPSY  07/05/2019   Procedure: BIOPSY;  Surgeon: Ladene Artist, MD;  Location: [facility]COPY;  Service: Endoscopy;;   [place]; 1989   COLONOSCOPY WITH PROPOFOL N/A 09/19/2013   Procedure: COLONOSCOPY WITH PROPOFOL;  Surgeon: Ladene Artist, MD;  Location: [facility];  Service: Endoscopy;  Laterality: N/A;   COLONOSCOPY WITH PROPOFOL N/A 07/05/2019   Procedure: COLONOSCOPY WITH PROPOFOL;  Surgeon: Ladene Artist, MD;  [facility]: [facility];  Service: Endoscopy;  Laterality: N/A;   DILATION AND CURETTAGE OF UTERUS     ESOPHAGOGASTRODUODENOSCOPY N/A 08/10/2015   Procedure: ESOPHAGOGASTRODUODENOSCOPY (EGD);  Surgeon: Gatha Mayer, MD;  Location: [facility] ENDOSCOPY;  Service: Endoscopy;  Laterality: N/A;   ESOPHAGOGASTRODUODENOSCOPY (EGD) WITH PROPOFOL N/A 03/21/2015   Procedure: ESOPHAGOGASTRODUODENOSCOPY (EGD) WITH PROPOFOL;  Surgeon: Gatha Mayer, MD;  Location: [place];  Service: Endoscopy;  Laterality: N/A;   ESOPHAGOGASTRODUODENOSCOPY (EGD) WITH PROPOFOL N/A 07/05/2019   Procedure: ESOPHAGOGASTRODUODENOSCOPY (EGD) WITH PROPOFOL;  Surgeon: Ladene Artist, MD;  [facility];  Service: Endoscopy;  Laterality: N/A;   PARTIAL KNEE ARTHROPLASTY Right 08/30/2013   Procedure: RIGHT UNICOMPARTMENTAL  KNEE;  Surgeon: Johnny Bridge, MD;  Location: [place];  Service: Orthopedics;  Laterality: Right;   PARTIAL KNEE ARTHROPLASTY Left 12/02/2013   Procedure: LEFT KNEE UNI ARTHROPLASTY;  Surgeon: Johnny Bridge, MD;  Location: [place];  Service: Orthopedics;  Laterality: Left;   PEG PLACEMENT N/A 08/10/2015   Procedure: PERCUTANEOUS ENDOSCOPIC GASTROSTOMY (PEG) PLACEMENT;  Surgeon: Gatha Mayer, MD;  Location: [place];   Service: Endoscopy;  Laterality: N/A;   POLYPECTOMY  07/05/2019   Procedure: POLYPECTOMY;  Surgeon: Ladene Artist, MD;  [facility];  Service: Endoscopy;;   [place]  1990's?   "apparently took out one of my ovaries at the time too cause one's missing"   Social History   Socioeconomic History   Marital status: Married    Spouse name: Not on file   Number of children: 3   Years of education: Not on file   Highest education level: Not on file  Occupational History   Occupation: disabled  Tobacco Use   Smoking status: Former    Packs/day: 1.50    Years: 38.00    Pack years: 57.00    Types: Cigarettes    Start date: 03/03/1974    Quit date: 05/14/2012    Years since quitting: 8.4   Smokeless tobacco: Never   Tobacco comments:    denies thoughts of restarting  Vaping Use   Vaping Use: Former  Substance and Sexual Activity   Alcohol use: No    Alcohol/week: 0.0 standard drinks   Drug use: No   Sexual activity: Not Currently    Birth control/protection: Surgical  Other Topics Concern   Not on file  Social History Narrative   Work or School: [employer] Situation: lives with husband and daughters      Spiritual Beliefs: Christian      Lifestyle: no regular exercise; trying to eat healthy      [facility] (09/11/16):   Originally from [facility]. Has always lived in [place]. No pets currently. Does have carpet in her home in her bedroom. No bird exposure. Previously had mold under her kitchen sink. No indoor plants. Previously was working in [employer].          Social Determinants of Health   Financial Resource Strain: Not on file  Food Insecurity: Not on file  Transportation Needs: Not on file  Physical Activity: Not on file  Stress: Not on file  Social Connections: Not on file   Family History  Problem Relation Age of Onset   Heart disease Mother 18   Hypertension Mother     Stroke Father    Hypertension Father    Other Sister        Esophageal strictures   Anemia Sister    COPD Brother    Leukemia Sister    Leukemia Sister    Colon cancer Neg Hx    Allergies  Allergen Reactions   Ciprofloxacin Shortness Of Breath and Rash   Naproxen Shortness Of Breath   Shrimp [Shellfish Allergy] Hives and Shortness Of Breath    ER required   Dicyclomine Hives   Methocarbamol Hives   Pineapple Itching    Itchy lips and tongue   Morphine And Related Other (See Comments)    Severe headache   Prior to Admission medications   Medication Sig Start Date End Date Taking? Authorizing Provider  acetaminophen (  TYLENOL) 500 MG tablet Take 500 mg by mouth in the morning and at bedtime.    [provider]  albuterol (PROVENTIL) (2.5 MG/3ML) 0.083% nebulizer solution USE 1 VIAL IN NEBULIZER EVERY 6 HOURS AS NEEDED FOR SHORTNESS OF BREATH AND WHEEZING 09/23/18   [name], MD  albuterol (VENTOLIN HFA) 108 (90 Base) MCG/ACT inhaler INHALE 2 PUFFS INTO THE LUNGS EVERY 6 HOURS AS NEEDED FOR WHEEZE OR SHORTNESS OF BREATH Patient taking differently: Inhale into the lungs every 6 (six) hours as needed for shortness of breath or wheezing. 04/26/19   [name], MD  APPLE CIDER VINEGAR PO Take 1 capsule by mouth daily.    [provider]  Ascorbic Acid (VITAMIN C PO) Take 1,000 mg by mouth daily.    [provider]  cetirizine (ZYRTEC) 10 MG tablet TAKE 1 TABLET (10 MG TOTAL) BY MOUTH DAILY. NO THERAPEUTIC SUBSTITUTION Patient taking differently: Take 10 mg by mouth daily. 04/19/20   [name], MD  clonazePAM (KLONOPIN) 0.5 MG tablet TAKE 1 TABLET BY MOUTH TWICE A DAY AS NEEDED FOR ANXIETY 08/22/20   [name], MD  Cyanocobalamin (VITAMIN B12 PO) Take 1 tablet by mouth daily.    [provider]  docusate sodium (COLACE) 100 MG capsule Take 1 capsule (100 mg total) by mouth 2 (two) times daily as needed for mild constipation or  moderate constipation. 06/21/18   [name], MD  ferrous sulfate 325 (65 FE) MG tablet Take 1 tablet (325 mg total) by mouth daily. 10/03/19   [name], MD  fluticasone (FLONASE) 50 MCG/ACT nasal spray Place 2 sprays into both nostrils daily. Patient taking differently: Place 2 sprays into both nostrils daily as needed for allergies. 04/02/20   Carollee Leitz, MD  gabapentin (NEURONTIN) 300 MG capsule Take 300 mg by mouth 3 (three) times daily as needed (pain).    [provider]  guaiFENesin (ROBITUSSIN) 100 MG/5ML SOLN Take 15 mLs by mouth daily as needed for cough or to loosen phlegm.    [provider]  Menthol 10 MG LOZG Take 1 lozenge by mouth daily as needed (cough).    [provider]  montelukast (SINGULAIR) 10 MG tablet TAKE 1 TABLET BY MOUTH EVERYDAY AT BEDTIME Patient taking differently: Take 10 mg by mouth at bedtime. 04/23/20   [name], MD  mycophenolate (CELLCEPT) 500 MG tablet Take 1,000 mg by mouth 2 (two) times daily. 07/24/17   [provider]  omeprazole (PRILOSEC) 20 MG capsule TAKE 1 CAPSULE BY MOUTH DAILY. PLEASE SCHEDULE A YEARLY FOLLOW UP VISIT FOR FURTHER REFILLS. 07/13/20   Ladene Artist, MD  oxyCODONE (OXY IR/ROXICODONE) 5 MG immediate release tablet Take 5 mg by mouth every 6 (six) hours as needed for moderate pain. Patient not taking: Reported on 09/26/2020 04/10/20   [provider]  pyridostigmine (MESTINON) 60 MG tablet Take 0.5 tablets (30 mg total) by mouth 3 (three) times daily. 05/05/20 06/04/20  Lilland, Alana, DO  sertraline (ZOLOFT) 50 MG tablet TAKE 3 TABLETS BY MOUTH EVERY DAY 09/27/20   Andrena Mews T, MD  VITAMIN D, CHOLECALCIFEROL, PO Take 1 tablet by mouth daily. otc    [provider]  Vitamin D, Ergocalciferol, (DRISDOL) 1.25 MG (50000 UNIT) CAPS capsule Take 50,000 Units by mouth once a week. 11/25/19   [provider]  WEGOVY 0.25 MG/0.5ML SOAJ SMARTSIG:0.5 Milliliter(s)  SUB-Q Once a Week Patient not taking: Reported on 09/26/2020 05/23/20  [provider]     Positive ROS: Otherwise negative  All other systems have been reviewed and were otherwise negative with the exception of those mentioned in the HPI and as above.  Physical Exam: Constitutional: Alert, well-appearing, no acute distress Ears: External ears without lesions or tenderness.  Right ear with some slight scabbing crusting within the concha area.  However the right ear canal and TM are clear with good mobility on pneumatic otoscopy.  No hearing problems on tuning fork testing.  On examination of the left side she has wax partially obstructing the left ear canal visualization of the left TM that was removed with suction.  The TM was otherwise clear with good hearing on tuning fork testing. Nasal: External nose without lesions. Septum with minimal deformity and mild rhinitis.  After decongesting the nose both nasal passages are clear with no polyps.  Both middle meatus regions are clear with no signs of infection.. Oral: Lips and gums without lesions. Tongue and palate mucosa without lesions. Posterior oropharynx clear.  She has cryptic small tonsils bilaterally that are nonobstructing.  She does have a little bit of white debris within the some the tonsillar crypts but no signs of infection.  Indirect laryngoscopy revealed a clear base of tongue vallecula and epiglottis.  Vocal cords were clear bilaterally. Neck: No palpable adenopathy or masses Respiratory: Breathing comfortably  Skin: No facial/neck lesions or rash noted.  Cerumen impaction removal  Date/Time: 10/09/2020 4:26 PM Performed by: Rozetta Nunnery, MD Authorized by: Rozetta Nunnery, MD   Consent:    Consent obtained:  Verbal   Consent given by:  Patient   Risks discussed:  Pain and bleeding Procedure details:    Location:  L ear   Procedure type: suction   Post-procedure details:    Inspection:  TM intact and  canal normal   Hearing quality:  Improved   Procedure completion:  Tolerated well, no immediate complications Comments:     Left ear canal with cerumen that was obstructing the ear canal and TM that was removed with suction.  The TMs were clear bilaterally otherwise.  Assessment: Clear oropharyngeal and upper airway examination with history of tonsil stones.  Tonsils are small and benign bilaterally otherwise on clinical exam. Mild rhinitis.  Plan: Ear canals were cleaned in the office today with normal ear canal and TMs otherwise.  For the crusting in the concha area recommended use of Vaseline or skin lotion to help with the crusting. Recommended regular use of Flonase 2 sprays each nostril at night to help with any nasal congestion and postnasal drainage as well as use of saline rinses which she already has. She will follow-up as needed.   Radene Journey, MD   CC:

## 2020-10-24 ENCOUNTER — Ambulatory Visit: Payer: BC Managed Care – PPO

## 2020-10-24 NOTE — Patient Instructions (Signed)
Visit Information  Ms. Sligh  it was nice speaking with you. Please call me directly [phone] if you have questions about the goals we discussed.   Goals Addressed               This Visit's Progress     I have had some shortness of breath. (pt-stated)        Timeframe:  Long-Range Goal Priority:  High Start Date:  09/26/20                           Expected End Date:    11/30/20               Patient will self administer medications as prescribed Patient will attend all scheduled provider appointments Patient will call pharmacy for medication refills Patient will call provider office for new concerns or questions      The patient verbalizes understanding of the information and instructions discussed today.  Our next appointment is scheduled for  11/28/20.   Please feel free to call me or the office if you have any questions or concerns.  [name] RN, BSN, [facility] Care Management Coordinator [place] Phone: [phone] Fax: [phone]

## 2020-10-24 NOTE — Chronic Care Management (AMB) (Signed)
Chronic Care Management   CCM RN Visit Note  10/24/2020 Name: Frances Medina MRN: [id number] DOB: December 16, 1960  Subjective: Frances Medina is a 60 y.o. year old female who is a primary care patient of [name], MD. The care management team was consulted for assistance with disease management and care coordination needs.    Engaged with patient by telephone for follow up visit in response to provider referral for case management and/or care coordination services.   Consent to Services:  The patient was given information about Chronic Care Management services, agreed to services, and gave verbal consent prior to initiation of services.  Please see initial visit note for detailed documentation.   Patient agreed to services and verbal consent obtained.    Assessment:  The patient continues to maintain positive progress with care plan goals.. See Care Plan below for interventions and patient self-care actives. Follow up Plan: Patient would like continued follow-up.  CCM RNCM will outreach the patient within the next 5 weeks  Patient will call office if needed prior to next encounter Review of patient past medical history, allergies, medications, health status, including review of consultants reports, laboratory and other test data, was performed as part of comprehensive evaluation and provision of chronic care management services.   SDOH (Social Determinants of Health) assessments and interventions performed:    CCM Care Plan  Allergies  Allergen Reactions   Ciprofloxacin Shortness Of Breath and Rash   Naproxen Shortness Of Breath   Shrimp [Shellfish Allergy] Hives and Shortness Of Breath    ER required   Dicyclomine Hives   Methocarbamol Hives   Pineapple Itching    Itchy lips and tongue   Morphine And Related Other (See Comments)    Severe headache    Outpatient Encounter Medications as of 10/24/2020  Medication Sig   acetaminophen (TYLENOL) 500 MG tablet Take 500 mg by  mouth in the morning and at bedtime.   albuterol (PROVENTIL) (2.5 MG/3ML) 0.083% nebulizer solution USE 1 VIAL IN NEBULIZER EVERY 6 HOURS AS NEEDED FOR SHORTNESS OF BREATH AND WHEEZING   albuterol (VENTOLIN HFA) 108 (90 Base) MCG/ACT inhaler INHALE 2 PUFFS INTO THE LUNGS EVERY 6 HOURS AS NEEDED FOR WHEEZE OR SHORTNESS OF BREATH (Patient taking differently: Inhale into the lungs every 6 (six) hours as needed for shortness of breath or wheezing.)   APPLE CIDER VINEGAR PO Take 1 capsule by mouth daily.   Ascorbic Acid (VITAMIN C PO) Take 1,000 mg by mouth daily.   cetirizine (ZYRTEC) 10 MG tablet TAKE 1 TABLET (10 MG TOTAL) BY MOUTH DAILY. NO THERAPEUTIC SUBSTITUTION (Patient taking differently: Take 10 mg by mouth daily.)   clonazePAM (KLONOPIN) 0.5 MG tablet TAKE 1 TABLET BY MOUTH TWICE A DAY AS NEEDED FOR ANXIETY   Cyanocobalamin (VITAMIN B12 PO) Take 1 tablet by mouth daily.   docusate sodium (COLACE) 100 MG capsule Take 1 capsule (100 mg total) by mouth 2 (two) times daily as needed for mild constipation or moderate constipation.   ferrous sulfate 325 (65 FE) MG tablet Take 1 tablet (325 mg total) by mouth daily.   fluticasone (FLONASE) 50 MCG/ACT nasal spray Place 2 sprays into both nostrils daily. (Patient taking differently: Place 2 sprays into both nostrils daily as needed for allergies.)   gabapentin (NEURONTIN) 300 MG capsule Take 300 mg by mouth 3 (three) times daily as needed (pain).   guaiFENesin (ROBITUSSIN) 100 MG/5ML SOLN Take 15 mLs by mouth daily as needed for cough or  to loosen phlegm.   Menthol 10 MG LOZG Take 1 lozenge by mouth daily as needed (cough).   montelukast (SINGULAIR) 10 MG tablet TAKE 1 TABLET BY MOUTH EVERYDAY AT BEDTIME (Patient taking differently: Take 10 mg by mouth at bedtime.)   mycophenolate (CELLCEPT) 500 MG tablet Take 1,000 mg by mouth 2 (two) times daily.   omeprazole (PRILOSEC) 20 MG capsule TAKE 1 CAPSULE BY MOUTH DAILY. PLEASE SCHEDULE A YEARLY FOLLOW UP  VISIT FOR FURTHER REFILLS.   oxyCODONE (OXY IR/ROXICODONE) 5 MG immediate release tablet Take 5 mg by mouth every 6 (six) hours as needed for moderate pain. (Patient not taking: Reported on 09/26/2020)   pyridostigmine (MESTINON) 60 MG tablet Take 0.5 tablets (30 mg total) by mouth 3 (three) times daily.   sertraline (ZOLOFT) 50 MG tablet TAKE 3 TABLETS BY MOUTH EVERY DAY   VITAMIN D, CHOLECALCIFEROL, PO Take 1 tablet by mouth daily. otc   Vitamin D, Ergocalciferol, (DRISDOL) 1.25 MG (50000 UNIT) CAPS capsule Take 50,000 Units by mouth once a week.   WEGOVY 0.25 MG/0.5ML SOAJ SMARTSIG:0.5 Milliliter(s) SUB-Q Once a Week (Patient not taking: Reported on 09/26/2020)   No facility-administered encounter medications on file as of 10/24/2020.    Patient Active Problem List   Diagnosis Date Noted   Dyspnea on exertion 05/03/2020   Allergic rhinosinusitis 04/05/2020   Abdominal pain 08/26/2019   Hip pain, bilateral 08/26/2019   Positive ANA (antinuclear antibody) 08/26/2019   Tremor 07/19/2019   History of colonic polyps    Epigastric pain    Early satiety    Macromastia 06/17/2019   Seasonal allergies 02/18/2019   GAD (generalized anxiety disorder) 08/06/2018   Iron deficiency anemia 07/31/2017   Pre-diabetes 07/31/2017   Osteoarthritis of left hip 02/18/2017   OSA on CPAP 09/11/2016   Restrictive lung disease 10/11/2015   Healthcare maintenance 09/14/2015   Major depression    Headache, migraine    Shortness of breath    Splenomegaly 04/07/2015   Myasthenia gravis ([place])    Osteoarthritis of left knee 12/02/2013   Benign neoplasm of descending colon 09/19/2013   Osteoarthritis of right knee 08/30/2013   GERD (gastroesophageal reflux disease) 06/23/2013   Morbid obesity ([place]) 06/23/2013   Fibroids 06/23/2013    Conditions to be addressed/monitored: Weight loss  and Breathing  Care Plan : RN Case Manager  Updates made by [name], RN since 10/24/2020 12:00 AM     Problem:  Weight Management (Obesity)      Long-Range Goal: Weight Loss   Start Date: 03/22/2019  Expected End Date: 11/30/2020  Recent Progress: On track  Priority: Medium  Note:   Current Barriers:  Knowledge Deficits related to weight loss strategies as evidenced by weight gain reported weight 315 lbs. Chronic Disease Management support and education needs related to weight management in patient with OSA/Restrictive Lung Diease Nurse Case Manager Clinical Goal(s):  Over the next 90 days, patient will verbalize basic understanding of plan for initiation of weight loss strategies Over the next 90 days, patient will demonstrate improved health management independence as evidenced by keeping daily food log/diary and increasing daily water intake (patient does not have provider prescribed fluid restriction) Over the next 30 days, patient will work with RN Case Manager to address needs related to any barriers that she may have related to weight loss  Interventions:  Evaluation of current treatment plan related to weight loss and patient's adherence to plan as established by provider Provided verbal and/or written education  to patient re: recommended life style changes: avoid fad diets, make small/incremental dietary and light exercise changes( IE.., walk around her kitchen table, sit in a chair and exercise her arms with  small water bottles for a few minutes a day.), eat at the table and avoid eating in front of the TV, plan management of cravings, monitor snacking and cravings in food diary Discussed plans with patient for ongoing care management follow up and provided patient with direct contact information for care management team Reduces sodium in her diet, discussed the effects of sodium on the body. Weigh monthly Be realistic on weight to lose.  Patient states she has lost weight, She had an appointment with the weight loss clinic on 10/08/20 and she has lost a total of 40 lbs. She now weighs 292  lbs. activation or motivation to change monitored activity and exercise based on tolerance encouraged barriers to physical activity or exercise addressed encouragement and support provided participation in weight loss program or support group encouraged success praised Continue toi monitor food and fluid intake  Taking her medications as prescribed  Patient reports that she is feeling better.  She is taking [place] and plans to cntinue to use the medications for weight loss.   Patient Goals/Self Care Activities:  Performs ADL's independently Calls provider office for new concerns or questions       Problem: Breathing   Priority: High  Onset Date: 09/26/2020     Long-Range Goal: Patient to monitor and prevent any excaerbation with her breathing   Start Date: 09/26/2020  Expected End Date: 11/30/2020  Recent Progress: On track  Priority: High  Note:   Current Barriers:  Knowledge Deficits related to plan of care for management of Restrictive lung diease  Chronic Disease Management support and education needs related to Restrictive lung diease   RNCM Clinical Goal(s):  Patient will verbalize understanding of plan for management of  Restrictive lung diease  take all medications exactly as prescribed and will call provider for medication related questions demonstrate ongoing self health care management ability through collaboration with RN Care manager, provider, and care team.   Interventions: 1:1 collaboration with primary care provider regarding development and update of comprehensive plan of care as evidenced by provider attestation and co-signature Inter-disciplinary care team collaboration (see longitudinal plan of care)     (Status: New goal.) Evaluation of current treatment plan related to Restrictive lung diease  Discussed plans with patient for ongoing care management follow up and provided patient with direct contact information for care management team Advised  patient to continue to take her medications as prescribed, monitor her breathing, and contact her doctor if her breathing becomes worse. Patient notified me that she still has not received her new CPAP machine.  Patient states that she was contacted by a representative at [facility] and the informed heer that the CPAPs are on back order. She states that she is breathing better and tking her medications as prescribed.  Patient Goals/Self-Care Activities: Patient will self administer medications as prescribed Patient will attend all scheduled provider appointments Patient will call pharmacy for medication refills Patient will call provider office for new concerns or questions  Follow Up Plan: Telephone follow up appointment with care management team member scheduled for: August      Antwon Rochin RN, BSN, [place] Management Coordinator [place] Phone: [phone] I Fax: [phone]

## 2020-10-25 ENCOUNTER — Other Ambulatory Visit: Payer: Self-pay | Admitting: Gastroenterology

## 2020-10-25 ENCOUNTER — Other Ambulatory Visit: Payer: Self-pay | Admitting: Family Medicine

## 2020-11-06 NOTE — Telephone Encounter (Signed)
LVM to have pt call back to schedule AWV.   RE: confirm insurance and schedule AWV on my schedule if times are convenient for patient or other AWV schedule as template permits.   Closing note

## 2020-11-28 ENCOUNTER — Telehealth: Payer: Self-pay

## 2020-11-28 ENCOUNTER — Telehealth: Payer: BC Managed Care – PPO

## 2020-11-28 NOTE — Telephone Encounter (Signed)
   RN Case Manager Care Management   Phone Outreach    11/28/2020 Name: Frances Medina MRN: [id number] DOB: Oct 17, 1960  [name] is a 60 y.o. year old female who is a primary care patient of Eniola, Phill Myron, MD .   Telephone outreach was unsuccessful A HIPPA compliant phone message was left for the patient providing contact information and requesting a return call.   Plan:Will route chart to Care Guide to see if patient would like to reschedule phone appointment   Review of patient status, including review of consultants reports, relevant laboratory and other test results, and collaboration with appropriate care team members and the patient's provider was performed as part of comprehensive patient evaluation and provision of care management services.    [name] RN, BSN, [facility] Care Management Coordinator [place] Phone: [phone] I Fax: [phone]

## 2020-12-05 ENCOUNTER — Ambulatory Visit: Payer: BC Managed Care – PPO

## 2020-12-05 NOTE — Chronic Care Management (AMB) (Signed)
   RN Case Manager Care Management   Phone Outreach    12/05/2020 Name: AFTIN LYE MRN: [id number] DOB: April 24, 1960  [name] is a 60 y.o. year old female who is a primary care patient of Eniola, Tawanna Solo T, MD .   I received a call back from the patient; she stated that she had a breathing attack earlier and could not catch her breath. She had taken her medication and used her inhaler and was breathing some better but was a little shaky. I advised her I would reschedule the call when she had time to relax and get calmed down. Her health is our main concern.   Follow Up Plan: RNCM will follow up at the next scheduled Interval.  Review of patient status, including review of consultants reports, relevant laboratory and other test results, and collaboration with appropriate care team members and the patient's provider was performed as part of comprehensive patient evaluation and provision of care management services.    [name] RN, BSN, [facility] Care Management Coordinator [place] Phone: [phone] I Fax: [phone]

## 2020-12-13 ENCOUNTER — Telehealth: Payer: Medicare Other

## 2020-12-13 ENCOUNTER — Ambulatory Visit: Payer: Medicare Other

## 2020-12-13 NOTE — Patient Instructions (Signed)
Visit Information  Ms. Boulter  it was nice speaking with you. Please call me directly [phone] if you have questions about the goals we discussed.   Goals Addressed               This Visit's Progress     Develop a Weight Loss Readiness Plan        Timeframe:  Long-Range Goal Priority:  Medium Start Date:      02/13/20                       Expected End Date:  12/30/20  Patient Goals/Self Care Activities:  Performs ADL's independently Calls provider office for new concerns or questions Move as tolerated and rest when needed Take medications as prescribed       Why is this important?   Losing weight requires time to prepare your mind and your home.  Identify your eating habits and the emotions attached to eating.  Think about ways to be successful.  When you are not ready, you could lose motivation and give up on your plan.     Notes:       I have had some shortness of breath. (pt-stated)        Timeframe:  Long-Range Goal Priority:  High Start Date:  09/26/20                           Expected End Date:    12/30/20               Patient will self administer medications as prescribed Patient will attend all scheduled provider appointments Patient will call pharmacy for medication refills Patient will call provider office for new concerns or questions        The patient verbalized understanding of instructions, educational materials, and care plan provided today and declined offer to receive copy of patient instructions, educational materials, and care plan.   Follow up Plan: Patient would like continued follow-up.  CCM RNCM will outreach the patient within the next 5 weeks.  Patient will call office if needed prior to next encounter  [name], RN  [phone]

## 2020-12-13 NOTE — Chronic Care Management (AMB) (Signed)
Chronic Care Management   CCM RN Visit Note  12/13/2020 Name: Frances Medina MRN: [id number] DOB: 01/27/1961  Subjective: Frances Medina is a 60 y.o. year old female who is a primary care patient of [name], MD. The care management team was consulted for assistance with disease management and care coordination needs.    Engaged with patient by telephone for follow up visit in response to provider referral for case management and/or care coordination services.   Consent to Services:  The patient was given information about Chronic Care Management services, agreed to services, and gave verbal consent prior to initiation of services.  Please see initial visit note for detailed documentation.   Patient agreed to services and verbal consent obtained.    Assessment:  The patient continues to maintain positive progress with care plan goals.. See Care Plan below for interventions and patient self-care actives. Follow up Plan: Patient would like continued follow-up.  CCM RNCM will outreach the patient within the next 5 weeks.  Patient will call office if needed prior to next encounter Review of patient past medical history, allergies, medications, health status, including review of consultants reports, laboratory and other test data, was performed as part of comprehensive evaluation and provision of chronic care management services.   SDOH (Social Determinants of Health) assessments and interventions performed:    CCM Care Plan  Allergies  Allergen Reactions   Ciprofloxacin Shortness Of Breath and Rash   Naproxen Shortness Of Breath   Shrimp [Shellfish Allergy] Hives and Shortness Of Breath    ER required   Dicyclomine Hives   Methocarbamol Hives   Pineapple Itching    Itchy lips and tongue   Morphine And Related Other (See Comments)    Severe headache    Outpatient Encounter Medications as of 12/13/2020  Medication Sig   acetaminophen (TYLENOL) 500 MG tablet Take 500 mg by  mouth in the morning and at bedtime.   albuterol (PROVENTIL) (2.5 MG/3ML) 0.083% nebulizer solution USE 1 VIAL IN NEBULIZER EVERY 6 HOURS AS NEEDED FOR SHORTNESS OF BREATH AND WHEEZING   albuterol (VENTOLIN HFA) 108 (90 Base) MCG/ACT inhaler INHALE 2 PUFFS INTO THE LUNGS EVERY 6 HOURS AS NEEDED FOR WHEEZE OR SHORTNESS OF BREATH (Patient taking differently: Inhale into the lungs every 6 (six) hours as needed for shortness of breath or wheezing.)   APPLE CIDER VINEGAR PO Take 1 capsule by mouth daily.   Ascorbic Acid (VITAMIN C PO) Take 1,000 mg by mouth daily.   cetirizine (ZYRTEC) 10 MG tablet TAKE 1 TABLET (10 MG TOTAL) BY MOUTH DAILY. NO THERAPEUTIC SUBSTITUTION (Patient taking differently: Take 10 mg by mouth daily.)   clonazePAM (KLONOPIN) 0.5 MG tablet TAKE 1 TABLET BY MOUTH TWICE A DAY AS NEEDED FOR ANXIETY   Cyanocobalamin (VITAMIN B12 PO) Take 1 tablet by mouth daily.   docusate sodium (COLACE) 100 MG capsule Take 1 capsule (100 mg total) by mouth 2 (two) times daily as needed for mild constipation or moderate constipation.   ferrous sulfate 325 (65 FE) MG tablet Take 1 tablet (325 mg total) by mouth daily.   fluticasone (FLONASE) 50 MCG/ACT nasal spray Place 2 sprays into both nostrils daily. (Patient taking differently: Place 2 sprays into both nostrils daily as needed for allergies.)   gabapentin (NEURONTIN) 300 MG capsule Take 300 mg by mouth 3 (three) times daily as needed (pain).   guaiFENesin (ROBITUSSIN) 100 MG/5ML SOLN Take 15 mLs by mouth daily as needed for cough or  to loosen phlegm.   Menthol 10 MG LOZG Take 1 lozenge by mouth daily as needed (cough).   montelukast (SINGULAIR) 10 MG tablet TAKE 1 TABLET BY MOUTH EVERYDAY AT BEDTIME (Patient taking differently: Take 10 mg by mouth at bedtime.)   mycophenolate (CELLCEPT) 500 MG tablet Take 1,000 mg by mouth 2 (two) times daily.   omeprazole (PRILOSEC) 20 MG capsule TAKE 1 CAPSULE BY MOUTH DAILY. PLEASE SCHEDULE A YEARLY FOLLOW UP  VISIT FOR FURTHER REFILLS.   oxyCODONE (OXY IR/ROXICODONE) 5 MG immediate release tablet Take 5 mg by mouth every 6 (six) hours as needed for moderate pain. (Patient not taking: Reported on 09/26/2020)   pyridostigmine (MESTINON) 60 MG tablet Take 0.5 tablets (30 mg total) by mouth 3 (three) times daily.   sertraline (ZOLOFT) 50 MG tablet TAKE 3 TABLETS BY MOUTH EVERY DAY   VITAMIN D, CHOLECALCIFEROL, PO Take 1 tablet by mouth daily. otc   Vitamin D, Ergocalciferol, (DRISDOL) 1.25 MG (50000 UNIT) CAPS capsule Take 50,000 Units by mouth once a week.   WEGOVY 0.25 MG/0.5ML SOAJ SMARTSIG:0.5 Milliliter(s) SUB-Q Once a Week (Patient not taking: Reported on 09/26/2020)   No facility-administered encounter medications on file as of 12/13/2020.    Patient Active Problem List   Diagnosis Date Noted   Dyspnea on exertion 05/03/2020   Allergic rhinosinusitis 04/05/2020   Abdominal pain 08/26/2019   Hip pain, bilateral 08/26/2019   Positive ANA (antinuclear antibody) 08/26/2019   Tremor 07/19/2019   History of colonic polyps    Epigastric pain    Early satiety    Macromastia 06/17/2019   Seasonal allergies 02/18/2019   GAD (generalized anxiety disorder) 08/06/2018   Iron deficiency anemia 07/31/2017   Pre-diabetes 07/31/2017   Osteoarthritis of left hip 02/18/2017   OSA on CPAP 09/11/2016   Restrictive lung disease 10/11/2015   Healthcare maintenance 09/14/2015   Major depression    Headache, migraine    Shortness of breath    Splenomegaly 04/07/2015   Myasthenia gravis ([place])    Osteoarthritis of left knee 12/02/2013   Benign neoplasm of descending colon 09/19/2013   Osteoarthritis of right knee 08/30/2013   GERD (gastroesophageal reflux disease) 06/23/2013   Morbid obesity ([place]) 06/23/2013   Fibroids 06/23/2013    Conditions to be addressed/monitored: Weight loss and Breathing  Care Plan : RN Case Manager  Updates made by [name], RN since 12/13/2020 12:00 AM     Problem:  Weight Management (Obesity)      Long-Range Goal: Weight Loss   Start Date: 03/22/2019  Expected End Date: 11/30/2020  Recent Progress: On track  Priority: Medium  Note:   Current Barriers:  Knowledge Deficits related to weight loss strategies as evidenced by weight gain reported weight 315 lbs. Chronic Disease Management support and education needs related to weight management in patient with OSA/Restrictive Lung Diease Nurse Case Manager Clinical Goal(s):  Over the next 90 days, patient will verbalize basic understanding of plan for initiation of weight loss strategies Over the next 90 days, patient will demonstrate improved health management independence as evidenced by keeping daily food log/diary and increasing daily water intake (patient does not have provider prescribed fluid restriction) Over the next 30 days, patient will work with RN Case Manager to address needs related to any barriers that she may have related to weight loss  Interventions:  Evaluation of current treatment plan related to weight loss and patient's adherence to plan as established by provider Provided verbal and/or written education to  patient re: recommended life style changes: avoid fad diets, make small/incremental dietary and light exercise changes( IE.., walk around her kitchen table, sit in a chair and exercise her arms with  small water bottles for a few minutes a day.), eat at the table and avoid eating in front of the TV, plan management of cravings, monitor snacking and cravings in food diary Discussed plans with patient for ongoing care management follow up and provided patient with direct contact information for care management team Reduces sodium in her diet, discussed the effects of sodium on the body. Weigh monthly Be realistic on weight to lose.   activation or motivation to change monitored activity and exercise based on tolerance encouraged barriers to physical activity or exercise  addressed encouragement and support provided participation in weight loss program or support group encouraged success praised Continue toi monitor food and fluid intake  12/13/20: Talking with Mrs. Isenhower, she continues to do well with her weight loss.  She can tell the difference in her clothes.  she is taking her medications and still taking the Saxenda shot, monitoring her diet, and doing her chores around the house that has her walking 7000 steps or more.    Patient Goals/Self Care Activities:  Performs ADL's independently Calls provider office for new concerns or questions       Problem: Breathing   Priority: High  Onset Date: 09/26/2020     Long-Range Goal: Patient to monitor and prevent any excaerbation with her breathing   Start Date: 09/26/2020  Expected End Date: 11/30/2020  Recent Progress: On track  Priority: High  Note:   Current Barriers:  Knowledge Deficits related to plan of care for management of Restrictive lung diease  Chronic Disease Management support and education needs related to Restrictive lung diease   RNCM Clinical Goal(s):  Patient will verbalize understanding of plan for management of  Restrictive lung diease  take all medications exactly as prescribed and will call provider for medication related questions demonstrate ongoing self health care management ability through collaboration with RN Care manager, provider, and care team.   Interventions: 1:1 collaboration with primary care provider regarding development and update of comprehensive plan of care as evidenced by provider attestation and co-signature Inter-disciplinary care team collaboration (see longitudinal plan of care)     (Status: New goal.) Evaluation of current treatment plan related to Restrictive lung diease  Discussed plans with patient for ongoing care management follow up and provided patient with direct contact information for care management team Advised patient to continue to  take her medications as prescribed, monitor her breathing, and contact her doctor if her breathing becomes worse. Patient notified me that she still has not received her new CPAP machine.   The weight loss has helped much with her breathing; she can tell the difference and is pleased.  Especially now, she has some complex family issues that she is dealing with that have her under some stress. The patient also let me know that she has not received her CPAP yet but they are keeping her informed with what progress is being made.     Patient Goals/Self-Care Activities: Patient will self administer medications as prescribed Patient will attend all scheduled provider appointments Patient will call pharmacy for medication refills Patient will call provider office for new concerns or questions  Follow Up Plan: Telephone follow up appointment with care management team member scheduled for: August      Cleota Pellerito RN, BSN, [facility] Care Management Coordinator [place]  [place] Phone: [phone] I Fax: [phone]

## 2021-01-08 ENCOUNTER — Ambulatory Visit: Payer: Self-pay

## 2021-01-08 NOTE — Chronic Care Management (AMB) (Signed)
   RN Case Manager Care Management   Phone Outreach    01/08/2021 Name: TAHARI CLABAUGH MRN: [id number] DOB: 09/19/1960  [name] is a 60 y.o. year old female who is a primary care patient of [name], MD .   CM RN received a call today from Mrs. Ravelo to inform me that she received her CPAP on the 31st of October, and she is delighted with it. She thanked me for all the hard work I had put in to help her receive it. I told her I was so happy that she could finally receive it.  Follow Up Plan:  RNCM will follow up at the next schedule interval.    Review of patient status, including review of consultants reports, relevant laboratory and other test results, and collaboration with appropriate care team members and the patient's provider was performed as part of comprehensive patient evaluation and provision of care management services.    [name] RN, BSN, [facility] Care Management Coordinator [place] Phone: [phone] I Fax: [phone]

## 2021-01-17 ENCOUNTER — Ambulatory Visit: Payer: Medicare Other

## 2021-01-17 NOTE — Chronic Care Management (AMB) (Signed)
Chronic Care Management   CCM RN Visit Note  01/17/2021 Name: Frances Medina MRN: [id number] DOB: [birth year]  Subjective: Frances Medina is a 60 y.o. year old female who is a primary care patient of [name], MD. The care management team was consulted for assistance with disease management and care coordination needs.    Engaged with patient by telephone for follow up visit in response to provider referral for case management and/or care coordination services.   Consent to Services:  The patient was given information about Chronic Care Management services, agreed to services, and gave verbal consent prior to initiation of services.  Please see initial visit note for detailed documentation.   Patient agreed to services and verbal consent obtained.    Assessment:  The patient continues to maintain positive progress with care plan goals.. See Care Plan below for interventions and patient self-care actives. Follow up Plan: Patient would like continued follow-up.  CCM RNCM will outreach the patient within the next 5 weeks.  Patient will call office if needed prior to next encounter  Review of patient past medical history, allergies, medications, health status, including review of consultants reports, laboratory and other test data, was performed as part of comprehensive evaluation and provision of chronic care management services.   SDOH (Social Determinants of Health) assessments and interventions performed:    CCM Care Plan  Allergies  Allergen Reactions   Ciprofloxacin Shortness Of Breath and Rash   Naproxen Shortness Of Breath   Shrimp [Shellfish Allergy] Hives and Shortness Of Breath    ER required   Dicyclomine Hives   Methocarbamol Hives   Pineapple Itching    Itchy lips and tongue   Morphine And Related Other (See Comments)    Severe headache    Outpatient Encounter Medications as of 01/17/2021  Medication Sig   acetaminophen (TYLENOL) 500 MG tablet Take 500 mg  by mouth in the morning and at bedtime.   albuterol (PROVENTIL) (2.5 MG/3ML) 0.083% nebulizer solution USE 1 VIAL IN NEBULIZER EVERY 6 HOURS AS NEEDED FOR SHORTNESS OF BREATH AND WHEEZING   albuterol (VENTOLIN HFA) 108 (90 Base) MCG/ACT inhaler INHALE 2 PUFFS INTO THE LUNGS EVERY 6 HOURS AS NEEDED FOR WHEEZE OR SHORTNESS OF BREATH (Patient taking differently: Inhale into the lungs every 6 (six) hours as needed for shortness of breath or wheezing.)   APPLE CIDER VINEGAR PO Take 1 capsule by mouth daily.   Ascorbic Acid (VITAMIN C PO) Take 1,000 mg by mouth daily.   cetirizine (ZYRTEC) 10 MG tablet TAKE 1 TABLET (10 MG TOTAL) BY MOUTH DAILY. NO THERAPEUTIC SUBSTITUTION (Patient taking differently: Take 10 mg by mouth daily.)   clonazePAM (KLONOPIN) 0.5 MG tablet TAKE 1 TABLET BY MOUTH TWICE A DAY AS NEEDED FOR ANXIETY   Cyanocobalamin (VITAMIN B12 PO) Take 1 tablet by mouth daily.   docusate sodium (COLACE) 100 MG capsule Take 1 capsule (100 mg total) by mouth 2 (two) times daily as needed for mild constipation or moderate constipation.   ferrous sulfate 325 (65 FE) MG tablet Take 1 tablet (325 mg total) by mouth daily.   fluticasone (FLONASE) 50 MCG/ACT nasal spray Place 2 sprays into both nostrils daily. (Patient taking differently: Place 2 sprays into both nostrils daily as needed for allergies.)   gabapentin (NEURONTIN) 300 MG capsule Take 300 mg by mouth 3 (three) times daily as needed (pain).   guaiFENesin (ROBITUSSIN) 100 MG/5ML SOLN Take 15 mLs by mouth daily as needed for cough  or to loosen phlegm.   Menthol 10 MG LOZG Take 1 lozenge by mouth daily as needed (cough).   montelukast (SINGULAIR) 10 MG tablet TAKE 1 TABLET BY MOUTH EVERYDAY AT BEDTIME (Patient taking differently: Take 10 mg by mouth at bedtime.)   mycophenolate (CELLCEPT) 500 MG tablet Take 1,000 mg by mouth 2 (two) times daily.   omeprazole (PRILOSEC) 20 MG capsule TAKE 1 CAPSULE BY MOUTH DAILY. PLEASE SCHEDULE A YEARLY FOLLOW UP  VISIT FOR FURTHER REFILLS.   oxyCODONE (OXY IR/ROXICODONE) 5 MG immediate release tablet Take 5 mg by mouth every 6 (six) hours as needed for moderate pain. (Patient not taking: Reported on 09/26/2020)   pyridostigmine (MESTINON) 60 MG tablet Take 0.5 tablets (30 mg total) by mouth 3 (three) times daily.   sertraline (ZOLOFT) 50 MG tablet TAKE 3 TABLETS BY MOUTH EVERY DAY   VITAMIN D, CHOLECALCIFEROL, PO Take 1 tablet by mouth daily. otc   Vitamin D, Ergocalciferol, (DRISDOL) 1.25 MG (50000 UNIT) CAPS capsule Take 50,000 Units by mouth once a week.   WEGOVY 0.25 MG/0.5ML SOAJ SMARTSIG:0.5 Milliliter(s) SUB-Q Once a Week (Patient not taking: Reported on 09/26/2020)   No facility-administered encounter medications on file as of 01/17/2021.    Patient Active Problem List   Diagnosis Date Noted   Allergic rhinosinusitis 04/05/2020   Positive ANA (antinuclear antibody) 08/26/2019   History of colonic polyps    Macromastia 06/17/2019   Seasonal allergies 02/18/2019   GAD (generalized anxiety disorder) 08/06/2018   Iron deficiency anemia 07/31/2017   Pre-diabetes 07/31/2017   Osteoarthritis of left hip 02/18/2017   OSA on CPAP 09/11/2016   Restrictive lung disease 10/11/2015   Major depression    Headache, migraine    Splenomegaly 04/07/2015   Myasthenia gravis ([place])    Osteoarthritis of left knee 12/02/2013   Benign neoplasm of descending colon 09/19/2013   Osteoarthritis of right knee 08/30/2013   GERD (gastroesophageal reflux disease) 06/23/2013   Morbid obesity ([place]) 06/23/2013   Fibroids 06/23/2013    Conditions to be addressed/monitored: Weight loss and Breathing  Care Plan : RN Case Manager  Updates made by [name], RN since 01/17/2021 12:00 AM     Problem: Weight Management (Obesity)      Long-Range Goal: Weight Loss   Start Date: 03/22/2019  Expected End Date: 03/01/2021  Recent Progress: On track  Priority: Medium  Note:   Current Barriers:  Knowledge Deficits  related to weight loss strategies as evidenced by weight gain reported weight 315 lbs. Chronic Disease Management support and education needs related to weight management in patient with OSA/Restrictive Lung Diease Nurse Case Manager Clinical Goal(s):  Over the next 90 days, patient will verbalize basic understanding of plan for initiation of weight loss strategies Over the next 90 days, patient will demonstrate improved health management independence as evidenced by keeping daily food log/diary and increasing daily water intake (patient does not have provider prescribed fluid restriction) Over the next 30 days, patient will work with RN Case Manager to address needs related to any barriers that she may have related to weight loss  Interventions:  Evaluation of current treatment plan related to weight loss and patient's adherence to plan as established by provider Provided verbal and/or written education to patient re: recommended life style changes: avoid fad diets, make small/incremental dietary and light exercise changes( IE.., walk around her kitchen table, sit in a chair and exercise her arms with  small water bottles for a few minutes a day.),  eat at the table and avoid eating in front of the TV, plan management of cravings, monitor snacking and cravings in food diary Discussed plans with patient for ongoing care management follow up and provided patient with direct contact information for care management team Reduces sodium in her diet, discussed the effects of sodium on the body. Weigh monthly Be realistic on weight to lose.   activation or motivation to change monitored activity and exercise based on tolerance encouraged barriers to physical activity or exercise addressed encouragement and support provided participation in weight loss program or support group encouraged success praised Continue to monitor food and fluid intake 01/17/21: Mrs. Lieser is doing well. She continues to lose  weight but has not weighed herself; she fears getting on the scale but can tell in her clothes. On December 8th, Mrs. Ercole has an appointment with Weight Management and will check her weight then. She has a visit with Dr. Gwendlyn Deutscher tomorrow for a check-up and flu shot.    Patient Goals/Self Care Activities: -Patient/Caregiver will self-administer medications as prescribed as evidenced by self-report/primary caregiver report , -Patient/Caregiver will attend all scheduled provider appointments as evidenced by clinician review of documented attendance to scheduled appointments and patient/caregiver report,  -Patient/Caregiver will call pharmacy for medication refills as evidenced by patient report and review of pharmacy fill history as appropriate,  -Patient/Caregiver will call provider office for new concerns or questions as evidenced by review of documented incoming telephone call notes and patient report, -Patient/Caregiver verbalizes understanding of plan, and  -Patient/Caregiver will focus on medication adherence by taking medications as prescribed - drink 6 to 8 glasses of water each day - eat 5 or 6 small meals each day - join a weight loss program - manage portion size      Problem: Breathing   Priority: High  Onset Date: 09/26/2020     Long-Range Goal: Patient to monitor and prevent any excaerbation with her breathing   Start Date: 09/26/2020  Expected End Date: 03/01/2021  Recent Progress: On track  Priority: High  Note:   Current Barriers:  Knowledge Deficits related to plan of care for management of Restrictive lung diease  Chronic Disease Management support and education needs related to Restrictive lung diease   RNCM Clinical Goal(s):  Patient will verbalize understanding of plan for management of  Restrictive lung diease  take all medications exactly as prescribed and will call provider for medication related questions demonstrate ongoing self health care management ability  through collaboration with RN Care manager, provider, and care team.   Interventions: 1:1 collaboration with primary care provider regarding development and update of comprehensive plan of care as evidenced by provider attestation and co-signature Inter-disciplinary care team collaboration (see longitudinal plan of care)     (Status: New goal.) Evaluation of current treatment plan related to Restrictive lung diease  Discussed plans with patient for ongoing care management follow up and provided patient with direct contact information for care management team Advised patient to continue to take her medications as prescribed, monitor her breathing, and contact her doctor if her breathing becomes worse. Patient notified me that she still has not received her new CPAP machine.   01/17/21:  Weight loss has helped with her breathing problems. She has not had any issues recently. Her appetite is not good; she sometimes does not want to eat and has to make herself. She said it might be due to stress. We discussed ways to reduce stress, exercise, meditate, and spiritual practices. I  advised her to continue to take her medications, exercise as tolerated, and eat small meals and snacks.     Patient Goals/Self Care Activities: -Patient/Caregiver will self-administer medications as prescribed as evidenced by self-report/primary caregiver report , -Patient/Caregiver will attend all scheduled provider appointments as evidenced by clinician review of documented attendance to scheduled appointments and patient/caregiver report,  -Patient/Caregiver will call pharmacy for medication refills as evidenced by patient report and review of pharmacy fill history as appropriate,  -Patient/Caregiver will call provider office for new concerns or questions as evidenced by review of documented incoming telephone call notes and patient report,  -Patient/Caregiver verbalizes understanding of plan, and -Patient/Caregiver will  focus on medication adherence by taking medications as prescribed.       [name] RN, BSN, [facility] Care Management Coordinator [place] Phone: [phone] I Fax: [phone]

## 2021-01-17 NOTE — Patient Instructions (Signed)
Visit Information  Frances Medina  it was nice speaking with you. Please call me directly [phone] if you have questions about the goals we discussed.  Patient Goals/Self Care Activities: -Patient/Caregiver will self-administer medications as prescribed as evidenced by self-report/primary caregiver report , -Patient/Caregiver will attend all scheduled provider appointments as evidenced by clinician review of documented attendance to scheduled appointments and patient/caregiver report,  -Patient/Caregiver will call pharmacy for medication refills as evidenced by patient report and review of pharmacy fill history as appropriate,  -Patient/Caregiver will call provider office for new concerns or questions as evidenced by review of documented incoming telephone call notes and patient report, -Patient/Caregiver verbalizes understanding of plan, and  -Patient/Caregiver will focus on medication adherence by taking medications as prescribed - drink 6 to 8 glasses of water each day - eat 5 or 6 small meals each day - join a weight loss program - manage portion size   The patient verbalized understanding of instructions, educational materials, and care plan provided today and declined offer to receive copy of patient instructions, educational materials, and care plan.   Follow up Plan: Patient would like continued follow-up.  CCM RNCM will outreach the patient within the next 5 weeks.  Patient will call office if needed prior to next encounter  [name], RN  [phone]

## 2021-01-18 ENCOUNTER — Ambulatory Visit (INDEPENDENT_AMBULATORY_CARE_PROVIDER_SITE_OTHER): Payer: BC Managed Care – PPO | Admitting: Family Medicine

## 2021-01-18 ENCOUNTER — Encounter: Payer: Self-pay | Admitting: Family Medicine

## 2021-01-18 ENCOUNTER — Ambulatory Visit (INDEPENDENT_AMBULATORY_CARE_PROVIDER_SITE_OTHER): Payer: BC Managed Care – PPO

## 2021-01-18 ENCOUNTER — Other Ambulatory Visit: Payer: Self-pay

## 2021-01-18 VITALS — BP 126/60 | HR 83 | Ht 60.0 in | Wt 292.8 lb

## 2021-01-18 DIAGNOSIS — Z0000 Encounter for general adult medical examination without abnormal findings: Secondary | ICD-10-CM | POA: Diagnosis not present

## 2021-01-18 DIAGNOSIS — R7303 Prediabetes: Secondary | ICD-10-CM

## 2021-01-18 DIAGNOSIS — G7000 Myasthenia gravis without (acute) exacerbation: Secondary | ICD-10-CM

## 2021-01-18 DIAGNOSIS — R7309 Other abnormal glucose: Secondary | ICD-10-CM

## 2021-01-18 DIAGNOSIS — F329 Major depressive disorder, single episode, unspecified: Secondary | ICD-10-CM

## 2021-01-18 DIAGNOSIS — Z23 Encounter for immunization: Secondary | ICD-10-CM | POA: Diagnosis not present

## 2021-01-18 LAB — POCT GLYCOSYLATED HEMOGLOBIN (HGB A1C): HbA1c, POC (controlled diabetic range): 5.5 % (ref 0.0–7.0)

## 2021-01-18 MED ORDER — ZOSTER VAC RECOMB ADJUVANTED 50 MCG/0.5ML IM SUSR: 0.5000 mL | Freq: Once | INTRAMUSCULAR | 1 refills | Status: AC

## 2021-01-18 MED ORDER — SERTRALINE HCL 50 MG PO TABS
150.0000 mg | ORAL_TABLET | Freq: Every day | ORAL | 1 refills | Status: DC
Start: 2021-01-18 — End: 2021-09-30

## 2021-01-18 MED ORDER — ALBUTEROL SULFATE HFA 108 (90 BASE) MCG/ACT IN AERS: INHALATION_SPRAY | RESPIRATORY_TRACT | 3 refills | Status: DC

## 2021-01-18 MED ORDER — MONTELUKAST SODIUM 10 MG PO TABS: 10.0000 mg | ORAL_TABLET | Freq: Every day | ORAL | 1 refills | Status: DC

## 2021-01-18 NOTE — Progress Notes (Signed)
Subjective:     Frances Medina is a 60 y.o. female and is here for a comprehensive physical exam. The patient reports no problems. Also medication refills and routine follow-up on chronic health problems. She follows up regularly with her weight management clinic and she takes [place] 3mg  IM QD for weight management.  Social History   Socioeconomic History   Marital status: Married    Spouse name: Not on file   Number of children: 3   Years of education: Not on file   Highest education level: Not on file  Occupational History   Occupation: disabled  Tobacco Use   Smoking status: Former    Packs/day: 1.50    Years: 38.00    Pack years: 57.00    Types: Cigarettes    Start date: 03/03/1974    Quit date: 05/14/2012    Years since quitting: 8.6   Smokeless tobacco: Never   Tobacco comments:    denies thoughts of restarting  Vaping Use   Vaping Use: Former  Substance and Sexual Activity   Alcohol use: No    Alcohol/week: 0.0 standard drinks   Drug use: No   Sexual activity: Not Currently    Birth control/protection: Surgical  Other Topics Concern   Not on file  Social History Narrative   Work or School: [employer] Situation: lives with husband and daughters      Spiritual Beliefs: Christian      Lifestyle: no regular exercise; trying to eat healthy      [facility] (09/11/16):   Originally from [facility]. Has always lived in [place]. No pets currently. Does have carpet in her home in her bedroom. No bird exposure. Previously had mold under her kitchen sink. No indoor plants. Previously was working in [employer].          Social Determinants of Health   Financial Resource Strain: Not on file  Food Insecurity: Not on file  Transportation Needs: Not on file  Physical Activity: Not on file  Stress: Not on file  Social Connections: Not on file  Intimate Partner Violence: Not on file   Health Maintenance  Topic Date Due   Zoster  Vaccines- Shingrix (1 of 2) Never done   Pneumococcal Vaccine 53-43 Years old (2 - PCV) 10/27/2017   COVID-19 Vaccine (3 - Pfizer risk series) 02/13/2020   INFLUENZA VACCINE  10/01/2020   PAP SMEAR-Modifier  10/18/2020   MAMMOGRAM  07/26/2021   TETANUS/TDAP  07/22/2023   COLONOSCOPY (Pts 45-43yrs Insurance coverage will need to be confirmed)  07/04/2029   Hepatitis C Screening  Completed   HIV Screening  Completed   HPV VACCINES  Aged Out    The following portions of the patient's history were reviewed and updated as appropriate: allergies, current medications, past family history, past medical history, past social history, past surgical history, and problem list.  Review of Systems Pertinent items noted in HPI and remainder of comprehensive ROS otherwise negative.   Objective:    BP 126/60   Pulse 83   Ht 5' (1.524 m)   Wt 292 lb 12.8 oz (132.8 kg)   SpO2 100%   BMI 57.18 kg/m  General appearance: alert and cooperative Head: Normocephalic, without obvious abnormality, atraumatic Eyes: conjunctivae/corneas clear. PERRL, EOM's intact. Fundi benign. Ears: normal TM's and external ear canals both ears Throat: lips, mucosa, and tongue normal; teeth and gums normal Neck: no adenopathy, no carotid bruit,  no JVD, supple, symmetrical, trachea midline, and thyroid not enlarged, symmetric, no tenderness/mass/nodules Lungs: clear to auscultation bilaterally Heart: regular rate and rhythm, S1, S2 normal, no murmur, click, rub or gallop Abdomen: soft, non-tender; bowel sounds normal; no masses,  no organomegaly Extremities: extremities normal, atraumatic, no cyanosis or edema Lymph nodes: Cervical, supraclavicular, and axillary nodes normal. Neurologic: Alert and oriented X 3, normal strength and tone. Normal symmetric reflexes. Normal coordination and gait    Assessment:    Healthy female exam. Normal exam     Plan:  Normal exam. She declined PAP - does not want to get screening  done anymore. Counseling provided regarding PAP screening. PCV20, Flu shot and COVID shot given during this visit. I counseled her on Shingrix and gave her script to take to her pharmacy for vaccination. For her morbid obesity, she is down to 292 from 308 - I congratulated her on her weight loss success. Continue Saxenda as instructed. FLP checked today. For her Prediabetes, her A1C is down to 5.5 - likely related to her use of Liraglutide and weight loss. Monitor closely. She is stable from MG standpoint.   See After Visit Summary for Counseling Recommendations

## 2021-01-18 NOTE — Assessment & Plan Note (Signed)
She is stable from MG standpoint.

## 2021-01-18 NOTE — Assessment & Plan Note (Signed)
For her morbid obesity, she is down to 292 from 308 - I congratulated her on her weight loss success. Continue Saxenda as instructed.

## 2021-01-18 NOTE — Patient Instructions (Signed)
Zoster Vaccine, Recombinant injection What is this medication? ZOSTER VACCINE (ZOS ter vak SEEN) is a vaccine used to reduce the risk of getting shingles. This vaccine is not used to treat shingles or nerve pain from shingles. This medicine may be used for other purposes; ask your health care provider or pharmacist if you have questions. COMMON BRAND NAME(S): [facility] What should I tell my care team before I take this medication? They need to know if you have any of these conditions: cancer immune system problems an unusual or allergic reaction to Zoster vaccine, other medications, foods, dyes, or preservatives pregnant or trying to get pregnant breast-feeding How should I use this medication? This vaccine is injected into a muscle. It is given by a health care provider. A copy of Vaccine Information Statements will be given before each vaccination. Be sure to read this information carefully each time. This sheet may change often. Talk to your health care provider about the use of this vaccine in children. This vaccine is not approved for use in children. Overdosage: If you think you have taken too much of this medicine contact a poison control center or emergency room at once. NOTE: This medicine is only for you. Do not share this medicine with others. What if I miss a dose? Keep appointments for follow-up (booster) doses. It is important not to miss your dose. Call your health care provider if you are unable to keep an appointment. What may interact with this medication? medicines that suppress your immune system medicines to treat cancer steroid medicines like prednisone or cortisone This list may not describe all possible interactions. Give your health care provider a list of all the medicines, herbs, non-prescription drugs, or dietary supplements you use. Also tell them if you smoke, drink alcohol, or use illegal drugs. Some items may interact with your medicine. What should I watch for  while using this medication? Visit your health care provider regularly. This vaccine, like all vaccines, may not fully protect everyone. What side effects may I notice from receiving this medication? Side effects that you should report to your doctor or health care professional as soon as possible: allergic reactions (skin rash, itching or hives; swelling of the face, lips, or tongue) trouble breathing Side effects that usually do not require medical attention (report these to your doctor or health care professional if they continue or are bothersome): chills headache fever nausea pain, redness, or irritation at site where injected tiredness vomiting This list may not describe all possible side effects. Call your doctor for medical advice about side effects. You may report side effects to FDA at 1-800-FDA-1088. Where should I keep my medication? This vaccine is only given by a health care provider. It will not be stored at home. NOTE: This sheet is a summary. It may not cover all possible information. If you have questions about this medicine, talk to your doctor, pharmacist, or health care provider.  2022 Elsevier/Gold Standard (2020-11-06 00:00:00)

## 2021-01-18 NOTE — Assessment & Plan Note (Signed)
Stable. [place] Office Visit from 01/18/2021 in [place]  PHQ-9 Total Score 8

## 2021-01-18 NOTE — Assessment & Plan Note (Signed)
For her Prediabetes, her A1C is down to 5.5 - likely related to her use of Liraglutide and weight loss. Monitor closely.

## 2021-01-19 LAB — LIPID PANEL
Chol/HDL Ratio: 4 ratio (ref 0.0–4.4)
Cholesterol, Total: 178 mg/dL (ref 100–199)
HDL: 45 mg/dL (ref >39)
LDL Chol Calc (NIH): 118 mg/dL — ABNORMAL HIGH (ref 0–99)
Triglycerides: 80 mg/dL (ref 0–149)
VLDL Cholesterol Cal: 15 mg/dL (ref 5–40)

## 2021-01-21 ENCOUNTER — Telehealth: Payer: Self-pay | Admitting: Family Medicine

## 2021-01-21 NOTE — Telephone Encounter (Signed)
I have discussed test result. LDL elevated compared to before.  ASCVD risk of 4.6%. Diet and exercise discussed. Recheck in 6-12 months. All questions were answered.

## 2021-02-11 ENCOUNTER — Other Ambulatory Visit: Payer: Self-pay | Admitting: Gastroenterology

## 2021-02-14 DIAGNOSIS — G7000 Myasthenia gravis without (acute) exacerbation: Secondary | ICD-10-CM | POA: Diagnosis not present

## 2021-02-21 ENCOUNTER — Ambulatory Visit: Payer: BC Managed Care – PPO

## 2021-02-22 NOTE — Chronic Care Management (AMB) (Signed)
Chronic Care Management   CCM RN Visit Note  02/22/2021 Name: Frances Medina MRN: [id number] DOB: 18-May-1960  Subjective: Frances Medina is a 60 y.o. year old female who is a primary care patient of [name], MD. The care management team was consulted for assistance with disease management and care coordination needs.    Engaged with patient by telephone for follow up visit in response to provider referral for case management and/or care coordination services.   Consent to Services:  The patient was given information about Chronic Care Management services, agreed to services, and gave verbal consent prior to initiation of services.  Please see initial visit note for detailed documentation.   Patient agreed to services and verbal consent obtained.    Assessment: Patient is making progress with her weight loss , buti s having difficulty with her left hip and knee and unable to exercise at the moment. See Care Plan below for interventions and patient self-care actives. Follow up Plan: Patient would like continued follow-up.  CCM RNCM will outreach the patient within the next 7 weeks  Patient will call office if needed prior to next encounter : Review of patient past medical history, allergies, medications, health status, including review of consultants reports, laboratory and other test data, was performed as part of comprehensive evaluation and provision of chronic care management services.   SDOH (Social Determinants of Health) assessments and interventions performed:    CCM Care Plan  Allergies  Allergen Reactions   Ciprofloxacin Shortness Of Breath and Rash   Naproxen Shortness Of Breath   Shrimp [Shellfish Allergy] Hives and Shortness Of Breath    ER required   Dicyclomine Hives   Methocarbamol Hives   Pineapple Itching    Itchy lips and tongue   Morphine And Related Other (See Comments)    Severe headache    Outpatient Encounter Medications as of 02/21/2021   Medication Sig   acetaminophen (TYLENOL) 500 MG tablet Take 500 mg by mouth in the morning and at bedtime. (Patient not taking: Reported on 01/18/2021)   albuterol (PROVENTIL) (2.5 MG/3ML) 0.083% nebulizer solution USE 1 VIAL IN NEBULIZER EVERY 6 HOURS AS NEEDED FOR SHORTNESS OF BREATH AND WHEEZING (Patient not taking: Reported on 01/18/2021)   albuterol (VENTOLIN HFA) 108 (90 Base) MCG/ACT inhaler INHALE 2 PUFFS INTO THE LUNGS EVERY 6 HOURS AS NEEDED FOR WHEEZE OR SHORTNESS OF BREATH   APPLE CIDER VINEGAR PO Take 1 capsule by mouth daily.   Ascorbic Acid (VITAMIN C PO) Take 1,000 mg by mouth daily.   cetirizine (ZYRTEC) 10 MG tablet TAKE 1 TABLET (10 MG TOTAL) BY MOUTH DAILY. NO THERAPEUTIC SUBSTITUTION (Patient taking differently: Take 10 mg by mouth daily.)   clonazePAM (KLONOPIN) 0.5 MG tablet TAKE 1 TABLET BY MOUTH TWICE A DAY AS NEEDED FOR ANXIETY   Cyanocobalamin (VITAMIN B12 PO) Take 1 tablet by mouth daily.   docusate sodium (COLACE) 100 MG capsule Take 1 capsule (100 mg total) by mouth 2 (two) times daily as needed for mild constipation or moderate constipation. (Patient not taking: Reported on 01/18/2021)   ferrous sulfate 325 (65 FE) MG tablet Take 1 tablet (325 mg total) by mouth daily.   fluticasone (FLONASE) 50 MCG/ACT nasal spray Place 2 sprays into both nostrils daily. (Patient not taking: Reported on 01/18/2021)   gabapentin (NEURONTIN) 300 MG capsule Take 300 mg by mouth 3 (three) times daily as needed (pain).   guaiFENesin (ROBITUSSIN) 100 MG/5ML SOLN Take 15 mLs by mouth  daily as needed for cough or to loosen phlegm. (Patient not taking: Reported on 01/18/2021)   Liraglutide -Weight Management (SAXENDA) 18 MG/3ML SOPN Inject 3 mg into the skin daily.   Menthol 10 MG LOZG Take 1 lozenge by mouth daily as needed (cough). (Patient not taking: Reported on 01/18/2021)   montelukast (SINGULAIR) 10 MG tablet Take 1 tablet (10 mg total) by mouth at bedtime.   mycophenolate (CELLCEPT)  500 MG tablet Take 1,000 mg by mouth 2 (two) times daily.   omeprazole (PRILOSEC) 20 MG capsule TAKE 1 CAPSULE BY MOUTH DAILY. PLEASE SCHEDULE A YEARLY FOLLOW UP VISIT FOR FURTHER REFILLS.   sertraline (ZOLOFT) 50 MG tablet Take 3 tablets (150 mg total) by mouth daily.   VITAMIN D, CHOLECALCIFEROL, PO Take 1 tablet by mouth daily. otc   No facility-administered encounter medications on file as of 02/21/2021.    Patient Active Problem List   Diagnosis Date Noted   Allergic rhinosinusitis 04/05/2020   Positive ANA (antinuclear antibody) 08/26/2019   History of colonic polyps    Macromastia 06/17/2019   Seasonal allergies 02/18/2019   GAD (generalized anxiety disorder) 08/06/2018   Iron deficiency anemia 07/31/2017   Pre-diabetes 07/31/2017   Osteoarthritis of left hip 02/18/2017   OSA on CPAP 09/11/2016   Restrictive lung disease 10/11/2015   Major depression    Headache, migraine    Splenomegaly 04/07/2015   Myasthenia gravis ([place])    Osteoarthritis of left knee 12/02/2013   Benign neoplasm of descending colon 09/19/2013   Osteoarthritis of right knee 08/30/2013   GERD (gastroesophageal reflux disease) 06/23/2013   Morbid obesity ([place]) 06/23/2013   Fibroids 06/23/2013    Conditions to be addressed/monitored: Weight loss and Brreathing  Care Plan : RN Case Manager  Updates made by [name], RN since 02/22/2021 12:00 AM     Problem: Weight Management (Obesity)      Long-Range Goal: Weight Loss   Start Date: 03/22/2019  Expected End Date: 04/02/2021  Recent Progress: On track  Priority: Medium  Note:   Current Barriers:  Knowledge Deficits related to weight loss strategies as evidenced by weight gain reported weight 315 lbs. Chronic Disease Management support and education needs related to weight management in patient with OSA/Restrictive Lung Diease Nurse Case Manager Clinical Goal(s):  Over the next 90 days, patient will verbalize basic understanding of plan for  initiation of weight loss strategies Over the next 90 days, patient will demonstrate improved health management independence as evidenced by keeping daily food log/diary and increasing daily water intake (patient does not have provider prescribed fluid restriction) Over the next 30 days, patient will work with RN Case Manager to address needs related to any barriers that she may have related to weight loss  Interventions: 1:1 collaboration with primary care provider regarding development and update of comprehensive plan of care as evidenced by provider attestation and co-signature Inter-disciplinary care team collaboration (see longitudinal plan of care) Evaluation of current treatment plan related to  self management and patient's adherence to plan as established by provider   Weight Loss:  (Status: Goal on Track (progressing): YES.) Long Term  Evaluation of current treatment plan related to weight loss and patient's adherence to plan as established by provider Discussed plans with patient for ongoing care management follow up and provided patient with direct contact information for care management team Reduces sodium in her diet, discussed the effects of sodium on the body. Weigh monthly Be realistic on weight to lose.  activation or motivation to change monitored activity and exercise based on tolerance encouraged barriers to physical activity or exercise addressed encouragement and support provided success praised Continue to monitor food and fluid intake 12/23//22: Speaking with Frances Medina, she is doing well and at a standstill with her weight. She has lost a total of 40 lbs. She is still monitoring her diet but cannot exercise as she wants due to problems with her hip and left knee. She plans to have the issues addressed after the holidays. She will also follow up with the weight loss clinic after the holidays.   Patient Goals/Self Care Activities: -Patient/Caregiver will  self-administer medications as prescribed as evidenced by self-report/primary caregiver report ,  -Patient/Caregiver will attend all scheduled provider appointments as evidenced by clinician review of documented attendance to scheduled appointments and patient/caregiver report,  -Patient/Caregiver will call pharmacy for medication refills as evidenced by patient report and review of pharmacy fill history as appropriate,  -Patient/Caregiver will call provider office for new concerns or questions as evidenced by review of documented incoming telephone call notes and patient report,  -Patient/Caregiver verbalizes understanding of plan, and  -Patient/Caregiver will focus on medication adherence by taking medications as prescribed - drink 6 to 8 glasses of water each day - eat 5 or 6 small meals each day - join a weight loss program - manage portion size      Problem: Breathing   Priority: High  Onset Date: 09/26/2020     Long-Range Goal: Patient to monitor and prevent any excaerbation with her breathing   Start Date: 09/26/2020  Expected End Date: 04/02/2021  Recent Progress: On track  Priority: High  Note:   Current Barriers:  Knowledge Deficits related to plan of care for management of Restrictive lung diease  Chronic Disease Management support and education needs related to Restrictive lung diease   RNCM Clinical Goal(s):  Patient will verbalize understanding of plan for management of  Restrictive lung diease  take all medications exactly as prescribed and will call provider for medication related questions demonstrate ongoing self health care management ability through collaboration with RN Care manager, provider, and care team.   Interventions: 1:1 collaboration with primary care provider regarding development and update of comprehensive plan of care as evidenced by provider attestation and co-signature Inter-disciplinary care team collaboration (see longitudinal plan of care)      (Status: Goal on Track (progressing): YES.) Long Term Evaluation of current treatment plan related to Restrictive lung diease  Discussed plans with patient for ongoing care management follow up and provided patient with direct contact information for care management team Advised patient to continue to take her medications as prescribed, monitor her breathing, and contact her doctor if her breathing becomes worse. Patient notified me that she still has not received her new CPAP machine.   12/23//22:  Her breathing has been fine no problems. The weight loss has significantly helped.    Patient Goals/Self Care Activities: -Patient/Caregiver will self-administer medications as prescribed as evidenced by self-report/primary caregiver report ,  -Patient/Caregiver will attend all scheduled provider appointments as evidenced by clinician review of documented attendance to scheduled appointments and patient/caregiver report,  -Patient/Caregiver will call pharmacy for medication refills as evidenced by patient report and review of pharmacy fill history as appropriate,  -Patient/Caregiver will call provider office for new concerns or questions as evidenced by review of documented incoming telephone call notes and patient report,  -Patient/Caregiver verbalizes understanding of plan,  -Patient/Caregiver will focus on medication adherence  by taking medications as prescribed.        [name] RN, BSN, [facility] Care Management Coordinator [place] Phone: [phone] I Fax: [phone]

## 2021-02-22 NOTE — Patient Instructions (Signed)
Visit Information  Ms. Cargo  it was nice speaking with you. Please call me directly [phone] if you have questions about the goals we discussed.    Patient Goals/Self Care Activities: -Patient/Caregiver will self-administer medications as prescribed as evidenced by self-report/primary caregiver report ,  -Patient/Caregiver will attend all scheduled provider appointments as evidenced by clinician review of documented attendance to scheduled appointments and patient/caregiver report,  -Patient/Caregiver will call pharmacy for medication refills as evidenced by patient report and review of pharmacy fill history as appropriate,  -Patient/Caregiver will call provider office for new concerns or questions as evidenced by review of documented incoming telephone call notes and patient report,  -Patient/Caregiver verbalizes understanding of plan, and  -Patient/Caregiver will focus on medication adherence by taking medications as prescribed - drink 6 to 8 glasses of water each day - eat 5 or 6 small meals each day - join a weight loss program - manage portion size    The patient verbalized understanding of instructions, educational materials, and care plan provided today and declined offer to receive copy of patient instructions, educational materials, and care plan.   Follow up Plan: Patient would like continued follow-up.  CCM RNCM will outreach the patient within the next 7 weeks.  Patient will call office if needed prior to next encounter  [name], RN  [phone]

## 2021-02-28 ENCOUNTER — Ambulatory Visit: Payer: BC Managed Care – PPO | Admitting: Family Medicine

## 2021-02-28 ENCOUNTER — Ambulatory Visit (INDEPENDENT_AMBULATORY_CARE_PROVIDER_SITE_OTHER): Payer: BC Managed Care – PPO | Admitting: Family Medicine

## 2021-02-28 ENCOUNTER — Other Ambulatory Visit: Payer: Self-pay

## 2021-02-28 ENCOUNTER — Telehealth: Payer: Self-pay

## 2021-02-28 ENCOUNTER — Encounter: Payer: Self-pay | Admitting: Family Medicine

## 2021-02-28 DIAGNOSIS — J0110 Acute frontal sinusitis, unspecified: Secondary | ICD-10-CM

## 2021-02-28 MED ORDER — AMOXICILLIN-POT CLAVULANATE 875-125 MG PO TABS: 1.0000 | ORAL_TABLET | Freq: Two times a day (BID) | ORAL | 0 refills | Status: DC

## 2021-02-28 MED ORDER — ACETAMINOPHEN-CODEINE 300-30 MG PO TABS: 1.0000 | ORAL_TABLET | Freq: Four times a day (QID) | ORAL | 0 refills | Status: AC | PRN

## 2021-02-28 MED ORDER — SULFAMETHOXAZOLE-TRIMETHOPRIM 800-160 MG PO TABS: 1.0000 | ORAL_TABLET | Freq: Two times a day (BID) | ORAL | 0 refills | Status: DC

## 2021-02-28 NOTE — Telephone Encounter (Signed)
error 

## 2021-03-01 NOTE — Progress Notes (Addendum)
Patient well-known to me he called for an appointment.  Unfortunately we do not have any available appointments today.  I spoke with her on the phone.  She has had several days of increasing facial and headache type pain that she has had before with sinusitis.  She is also having a lot of congestion and relatively bad taste in her mouth with postnasal drainage.  She is also had some cough that she thinks is mostly related to drainage.  No documented fever.  She is concerned because of her underlying diagnosis of multiple sclerosis and does not want to get "really sick" before she get into see the doctor.  I discussed with her at length and we will place her on antibiotics.  Initially I gave her Augmentin but when she went to pick it up the pharmacy said they had her is allergic to penicillin.  I discontinued that prescription and have sent Bactrim in.    Per our allergy list, we do not have any indication that she has allergy to penicillin.  I will leave it to PCP to further evaluate that and we have made an appointment for her to see her PCP next week.    I have also given her some Tylenol 3 for cough.  Strict instructions that she will call or go to the emergency room with any new or worsening symptoms. Patient consented for telephone call  Telephone call with patient at her home, provider in [facility] office 5 minutes total time Patient identified by date of birth and name

## 2021-03-04 NOTE — Progress Notes (Signed)
Please see note from the pharmacy from 05/04/2020 regarding Penicillin allergy. ..........................................................................................................................................[name] Cala Bradford, [facility]  Pharmacist Pharmacy Progress Notes     Signed Date of Service:  05/04/2020  2:34 PM  Signed     Patient had documented hives with penicillin use >10 years ago. In 2019 she receive at least 4 days of augmentin, meeting criteria for tolerating an oral challenge of a penicillin. Based on my chart review and interviewing the patient, I removed the penicillin allergy in the patient's chart.   Norina Buzzard, PharmD PGY1 Pharmacy Resident 05/04/2020 2:36 PM

## 2021-03-05 ENCOUNTER — Other Ambulatory Visit: Payer: Self-pay | Admitting: Gastroenterology

## 2021-03-05 DIAGNOSIS — Z760 Encounter for issue of repeat prescription: Secondary | ICD-10-CM | POA: Diagnosis not present

## 2021-03-05 DIAGNOSIS — Z6841 Body Mass Index (BMI) 40.0 and over, adult: Secondary | ICD-10-CM | POA: Diagnosis not present

## 2021-03-05 DIAGNOSIS — R7303 Prediabetes: Secondary | ICD-10-CM | POA: Diagnosis not present

## 2021-03-05 DIAGNOSIS — Z713 Dietary counseling and surveillance: Secondary | ICD-10-CM | POA: Diagnosis not present

## 2021-03-06 ENCOUNTER — Ambulatory Visit (INDEPENDENT_AMBULATORY_CARE_PROVIDER_SITE_OTHER): Payer: BC Managed Care – PPO | Admitting: Family Medicine

## 2021-03-06 ENCOUNTER — Encounter: Payer: Self-pay | Admitting: Family Medicine

## 2021-03-06 ENCOUNTER — Other Ambulatory Visit: Payer: Self-pay

## 2021-03-06 DIAGNOSIS — J321 Chronic frontal sinusitis: Secondary | ICD-10-CM | POA: Diagnosis not present

## 2021-03-06 DIAGNOSIS — K219 Gastro-esophageal reflux disease without esophagitis: Secondary | ICD-10-CM | POA: Diagnosis not present

## 2021-03-06 DIAGNOSIS — M1612 Unilateral primary osteoarthritis, left hip: Secondary | ICD-10-CM | POA: Diagnosis not present

## 2021-03-06 NOTE — Progress Notes (Addendum)
° ° °  SUBJECTIVE:   CHIEF COMPLAINT / HPI:   Sinus infection: She completed her Bactrim. Symptoms improved just a little. She still endorses throat irritation, postnasal drip, and mild frontal headache. She is compliant with her Flonase and Singulair but not her Zyrtec.  GERD: She self increased her Omperazole to 20 mg BID since she now has nightly reflux. No other GI symptoms. She tried famotidine in the past with no improvement in her symptoms.   Hip pain:  C/O left hip pain radiating to her thigh. She was given Tylenol #3, which helped a lot. Pain is worse with ambulation and weight bearing. She had PT and sports medicine evaluations in the past and steroid injections with minimal effects.   PERTINENT  PMH / PSH: PMHx reviewed  OBJECTIVE:   BP 120/61    Pulse 81    Ht 5' (1.524 m)    Wt 292 lb (132.5 kg)    SpO2 97%    BMI 57.03 kg/m   Physical Exam Vitals and nursing note reviewed.  HEENT: Mildly inflamed right ear canal, no discharge, Normal TM B/L. Normal oropharyngeal exam.  Cardiovascular:     Rate and Rhythm: Normal rate and regular rhythm.     Heart sounds: Normal heart sounds. No murmur heard. Pulmonary:     Effort: Pulmonary effort is normal. No respiratory distress.     Breath sounds: Normal breath sounds. No wheezing.  Abdominal:     General: Bowel sounds are normal.     Palpations: Abdomen is soft. There is no mass.     Tenderness: There is no abdominal tenderness.     Comments: Laparoscopy scars without inflammation  Musculoskeletal:     Comments: Antalgic gait Decrease ROM of her left hip due to pain     ASSESSMENT/PLAN:   Sinusitis Today marks day 10 of her symptoms. ?? Seasonal allergy vs bacterial sinusitis Mild improvement with A/B. I discussed use of steroid nasal spray/Flonase and Zyrtec (Med refilled). Will refer to ENT for further evaluation if there is no improvement in the next few weeks. She agreed with the plan.   GERD (gastroesophageal  reflux disease) I reviewed her colonoscopy and EGD from 2021. Stomach mucosa biopsy report - Mild chronic gastritis Unwilling to trail Famotidine as this did not help in the past. I recommended Omeprazole holiday as tolerated. She agreed with the plan. Monitor closely for now.  Consider GI referral if worsening.   Osteoarthritis of left hip Left hip xray from 2019 shows moderately severe osteoarthritic change in the left hip joint with moderate osteoarthritic change in the left sacroiliac joint. She is not interested in PT for now. Ok with orthopedic referral. Does not tolerate NSAID due to GERD. As discussed with her, I will not want her to continue Tylenol #3 - switch back to Tylenol recommended. Referral to orthopedic placed.     Andrena Mews, MD [place]

## 2021-03-06 NOTE — Assessment & Plan Note (Signed)
Today marks day 10 of her symptoms. ?? Seasonal allergy vs bacterial sinusitis Mild improvement with A/B. I discussed use of steroid nasal spray/Flonase and Zyrtec (Med refilled). Will refer to ENT for further evaluation if there is no improvement in the next few weeks. She agreed with the plan.

## 2021-03-06 NOTE — Assessment & Plan Note (Signed)
Left hip xray from 2019 shows moderately severe osteoarthritic change in the left hip joint with moderate osteoarthritic change in the left sacroiliac joint. She is not interested in PT for now. Ok with orthopedic referral. Does not tolerate NSAID due to GERD. As discussed with her, I will not want her to continue Tylenol #3 - switch back to Tylenol recommended. Referral to orthopedic placed.

## 2021-03-06 NOTE — Assessment & Plan Note (Signed)
I reviewed her colonoscopy and EGD from 2021. Stomach mucosa biopsy report - Mild chronic gastritis Unwilling to trail Famotidine as this did not help in the past. I recommended Omeprazole holiday as tolerated. She agreed with the plan. Monitor closely for now.  Consider GI referral if worsening.

## 2021-03-06 NOTE — Patient Instructions (Signed)
Hip Pain The hip is the joint between the upper legs and the lower pelvis. The bones, cartilage, tendons, and muscles of your hip joint support your body and allow you to move around. Hip pain can range from a minor ache to severe pain in one or both of your hips. The pain may be felt on the inside of the hip joint near the groin, or on the outside near the buttocks and upper thigh. You may also have swelling or stiffness in your hip area. Follow these instructions at home: Managing pain, stiffness, and swelling   If directed, put ice on the painful area. To do this: Put ice in a plastic bag. Place a towel between your skin and the bag. Leave the ice on for 20 minutes, 2-3 times a day. If directed, apply heat to the affected area as often as told by your health care provider. Use the heat source that your health care provider recommends, such as a moist heat pack or a heating pad. Place a towel between your skin and the heat source. Leave the heat on for 20-30 minutes. Remove the heat if your skin turns bright red. This is especially important if you are unable to feel pain, heat, or cold. You may have a greater risk of getting burned. Activity Do exercises as told by your health care provider. Avoid activities that cause pain. General instructions  Take over-the-counter and prescription medicines only as told by your health care provider. Keep a journal of your symptoms. Write down: How often you have hip pain. The location of your pain. What the pain feels like. What makes the pain worse. Sleep with a pillow between your legs on your most comfortable side. Keep all follow-up visits as told by your health care provider. This is important. Contact a health care provider if: You cannot put weight on your leg. Your pain or swelling continues or gets worse after one week. It gets harder to walk. You have a fever. Get help right away if: You fall. You have a sudden increase in pain and  swelling in your hip. Your hip is red or swollen or very tender to touch. Summary Hip pain can range from a minor ache to severe pain in one or both of your hips. The pain may be felt on the inside of the hip joint near the groin, or on the outside near the buttocks and upper thigh. Avoid activities that cause pain. Write down how often you have hip pain, the location of the pain, what makes it worse, and what it feels like. This information is not intended to replace advice given to you by your health care provider. Make sure you discuss any questions you have with your health care provider. Document Revised: 07/05/2018 Document Reviewed: 07/05/2018 Elsevier Patient Education  2022 [employer]  

## 2021-03-07 ENCOUNTER — Telehealth: Payer: Self-pay | Admitting: Family Medicine

## 2021-03-07 NOTE — Telephone Encounter (Signed)
HIPAA compliant callback message left.   Please connect her with me to help set up her MyChart account whenever she calls.   Staff can also help her set it up. Thanks.

## 2021-03-07 NOTE — Telephone Encounter (Signed)
Patient unable to change her password and unable to log in to her MyChart account with her old PW.  I created a new password at her request - [place] and advised her to login and update her PW to a different one.  She will contact me to let me know if this works or not.

## 2021-04-03 ENCOUNTER — Other Ambulatory Visit: Payer: Self-pay | Admitting: Family Medicine

## 2021-04-03 ENCOUNTER — Other Ambulatory Visit: Payer: Self-pay | Admitting: Gastroenterology

## 2021-04-11 ENCOUNTER — Telehealth: Payer: Self-pay | Admitting: Family Medicine

## 2021-04-11 MED ORDER — DOXYCYCLINE HYCLATE 100 MG PO TABS: 100.0000 mg | ORAL_TABLET | Freq: Two times a day (BID) | ORAL | 0 refills | Status: AC

## 2021-04-11 NOTE — Telephone Encounter (Signed)
Patient reached out through her daughter regarding worsening sinus drainage, cough and headache since last week. No SOB, no chest pain, and no fever. Similar to her presentation last month. Nothing is working and [facility] makes her nose burn.  F/U appointment scheduled. Treat with Doxy  and Tylenol prn pain till her appointment. Doxy  escribed.

## 2021-04-15 ENCOUNTER — Other Ambulatory Visit: Payer: Self-pay | Admitting: Family Medicine

## 2021-04-15 MED ORDER — OMEPRAZOLE 20 MG PO CPDR: DELAYED_RELEASE_CAPSULE | ORAL | 1 refills | Status: DC

## 2021-04-16 ENCOUNTER — Telehealth (INDEPENDENT_AMBULATORY_CARE_PROVIDER_SITE_OTHER): Payer: BC Managed Care – PPO | Admitting: Family Medicine

## 2021-04-16 ENCOUNTER — Encounter: Payer: Self-pay | Admitting: Family Medicine

## 2021-04-16 ENCOUNTER — Other Ambulatory Visit: Payer: Self-pay

## 2021-04-16 DIAGNOSIS — J321 Chronic frontal sinusitis: Secondary | ICD-10-CM | POA: Diagnosis not present

## 2021-04-16 DIAGNOSIS — F411 Generalized anxiety disorder: Secondary | ICD-10-CM | POA: Diagnosis not present

## 2021-04-16 NOTE — Assessment & Plan Note (Signed)
Continue allergy regimen and complete course of A/B treatment. Tussionex was discussed, but we will hold off, given that it contains opioids. She is responding well to Tessalon. We will monitor her closely on this for now. She will contact me soon if there is no improvement, in which case, we will consider referral to ENT for throat irritation. She agreed with the plan.

## 2021-04-16 NOTE — Progress Notes (Signed)
° ° ° °  [facility] Visit  Patient consented to have virtual visit and was identified by name and date of birth. Method of visit: Video  Encounter participants: Patient: Frances Medina - located at Home Provider: Andrena Mews - located at [facility] office Others (if applicable): N/A  Chief Complaint: SINUS PROBLEM  HPI:  Sinusitis: She still has an issue with sinus drainage with postnasal drip causing throat irritation. She denies fever and no SOB. Her cough is due to her postnasal drip in an attempt to clear her throat. She is compliant with her Zyrtec, Singulair, and Flonase. She has a few more days of her antibiotic. She started [employer] a few days ago, which helped with throat irritation and cough.  NB: She has had issues with throat irritation since she got a trach, but now a bit worsened with her postnasal drip. No other concerns.   Anxiety: Needs refill of her Klonopin.  ROS: per HPI  Pertinent PMHx: Revieved  Exam:  There were no vitals taken for this visit.  Physical Exam Constitutional:      General: She is not in acute distress.    Appearance: Normal appearance.  Pulmonary:     Comments: She could complete a full sentence.  Neurological:     Mental Status: She is alert and oriented to person, place, and time.     Assessment/Plan:  Sinusitis  Continue allergy regimen and complete course of A/B treatment. Tussionex was discussed, but we will hold off, given that it contains opioids. She is responding well to Tessalon. We will monitor her closely on this for now. She will contact me soon if there is no improvement, in which case, we will consider referral to ENT for throat irritation. She agreed with the plan.  GAD (generalized anxiety disorder) No acute flare. PDMP reviewed and is appropriate. Klonopin refilled.    Time spent during visit with patient: 25 minutes   Refill Denny Levy

## 2021-04-16 NOTE — Assessment & Plan Note (Signed)
No acute flare. PDMP reviewed and is appropriate. Klonopin refilled.

## 2021-04-16 NOTE — Patient Instructions (Signed)
Benzonatate Capsules What is this medication? BENZONATATE (ben ZOE na tate) is used to relieve cough. It works by calming your cough reflex. It belongs to a group of medications called cough suppressants. This medicine may be used for other purposes; ask your health care provider or pharmacist if you have questions. COMMON BRAND NAME(S): Tessalon Perles, Zonatuss What should I tell my care team before I take this medication? They need to know if you have any of these conditions: Kidney or liver disease An unusual or allergic reaction to benzonatate, anesthetics, other medications, foods, dyes, or preservatives Pregnant or trying to get pregnant Breast-feeding How should I use this medication? Take this medication by mouth with a glass of water. Follow the directions on the prescription label. Avoid breaking, chewing, or sucking the capsule, as this can cause serious side effects. Take your medication at regular intervals. Do not take your medication more often than directed. Talk to your care team about the use of this medication in children. While this medication may be prescribed for children as young as 69 years old for selected conditions, precautions do apply. Overdosage: If you think you have taken too much of this medicine contact a poison control center or emergency room at once. NOTE: This medicine is only for you. Do not share this medicine with others. What if I miss a dose? If you miss a dose, take it as soon as you can. If it is almost time for your next dose, take only that dose. Do not take double or extra doses. What may interact with this medication? Do not take this medication with any of the following: MAOIs like Carbex, Eldepryl, Marplan, Nardil, and Parnate This list may not describe all possible interactions. Give your health care provider a list of all the medicines, herbs, non-prescription drugs, or dietary supplements you use. Also tell them if you smoke, drink alcohol,  or use illegal drugs. Some items may interact with your medicine. What should I watch for while using this medication? Tell your care team if your symptoms do not improve or if they get worse. If you have a high fever, skin rash, or headache, see your care team. You may get drowsy or dizzy. Do not drive, use machinery, or do anything that needs mental alertness until you know how this medication affects you. Do not sit or stand up quickly, especially if you are an older patient. This reduces the risk of dizzy or fainting spells. What side effects may I notice from receiving this medication? Side effects that you should report to your care team as soon as possible: Allergic reactions--skin rash, itching, hives, swelling of the face, lips, tongue, or throat Confusion Hallucinations Side effects that usually do not require medical attention (report to your care team if they continue or are bothersome): Burning or tingling of the tongue, mouth, throat, or face Dizziness Drowsiness This list may not describe all possible side effects. Call your doctor for medical advice about side effects. You may report side effects to FDA at 1-800-FDA-1088. Where should I keep my medication? Keep out of the reach of children. Store at room temperature between 15 and 30 degrees C (59 and 86 degrees F). Keep tightly closed. Protect from light and moisture. Throw away any unused medication after the expiration date. NOTE: This sheet is a summary. It may not cover all possible information. If you have questions about this medicine, talk to your doctor, pharmacist, or health care provider.  2022 Elsevier/Gold Standard (2020-05-30  00:00:00) ° °

## 2021-04-18 ENCOUNTER — Ambulatory Visit: Payer: BC Managed Care – PPO

## 2021-04-19 NOTE — Chronic Care Management (AMB) (Signed)
Chronic Care Management   CCM RN Visit Note  04/19/2021 Name: Frances Medina MRN: [id number] DOB: 05-Jun-1960  Subjective: Frances Medina is a 61 y.o. year old female who is a primary care patient of [name], MD. The care management team was consulted for assistance with disease management and care coordination needs.    Engaged with patient by telephone for follow up visit in response to provider referral for case management and/or care coordination services.   Consent to Services:  The patient was given information about Chronic Care Management services, agreed to services, and gave verbal consent prior to initiation of services.  Please see initial visit note for detailed documentation.   Patient agreed to services and verbal consent obtained.    Summary: Patient is making progress with weight loss , but experienced difficulty with breathing last night due to something she ate. See Care Plan below for interventions and patient self-care actives.  Recommendation: The patient may benefit from monitoring her food intake, stay hydrated, exercise as tolerated, take her medication as prescribed, and rest.  The patient agrees.  Follow up Plan: Patient would like continued follow-up.  CCM RNCM will outreach the patient within the next 5 weeks.  Patient will call office if needed prior to next encounter   Assessment: Review of patient past medical history, allergies, medications, health status, including review of consultants reports, laboratory and other test data, was performed as part of comprehensive evaluation and provision of chronic care management services.   SDOH (Social Determinants of Health) assessments and interventions performed:    CCM Care Plan  Conditions to be addressed/monitored: Weight loss and Breathing  Care Plan : RN Case Manager  Updates made by [name], RN since 04/19/2021 12:00 AM     Problem: Weight Management (Obesity) and Breathing   Priority:  High  Onset Date: 09/26/2020     Long-Range Goal: Patient will will contniue to lose weight to help with and prevent any excaerbation with her breathing   Start Date: 09/26/2020  Expected End Date: 06/28/2021  Recent Progress: On track  Priority: High  Note:   Current Barriers:  Knowledge Deficits related to 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  Over the next 90 days, patient will verbalize basic understanding of plan for initiation of weight loss strategies Over the next 90 days, patient will demonstrate improved health management independence as evidenced by keeping daily food log/diary and increasing daily water intake (patient does not have provider prescribed fluid restriction)  Interventions: 1:1 collaboration with primary care provider regarding development and update of comprehensive plan of care as evidenced by provider attestation and co-signature Inter-disciplinary care team collaboration (see longitudinal plan of care)   Breathing:  (Status: Goal on Track (progressing): YES.) Long Term Evaluation of current treatment plan related to Restrictive lung diease  Discussed plans with patient for ongoing care management follow up and provided patient with direct contact information for care management team Advised patient to continue to take her medications as  prescribed, monitor her breathing, and contact her doctor if  her breathing becomes worse. Patient notified me that she still has not received her new CPAP machine.    Weight Loss:  (Status: Goal on Track (progressing): YES.) Long Term  Evaluation of current treatment plan related to weight loss and patient's adherence to plan as established by provider Discussed plans with patient for ongoing care management follow up and provided patient with direct contact information for care management team Reduces sodium in her diet, discussed the effects of sodium on the body. Weigh monthly Be realistic on weight to lose.   activation or motivation to change monitored activity and exercise based on tolerance encouraged barriers to physical activity or exercise addressed encouragement and support provided success praised Continue to monitor food and fluid intake 04/18/21: I talked with [facility] today, and she said she is doing well.  She recently had her baby brother pass; it has been heartbreaking.  Last night she ate some food that did not agree with her, which caused her to feel bloated and have some breathing issues.  She vomited and felt better but was weak and shaky.  She drank fluids and laid down, and eventually went to sleep.  I advised her to monitor her food intake, stay hydrated, take her medication as prescribed, and rest.      Patient Goals/Self Care Activities: -Patient/Caregiver will self-administer medications as prescribed as evidenced by self-report/primary caregiver report ,  -Patient/Caregiver will attend all scheduled provider appointments as evidenced by clinician review of documented attendance to scheduled appointments and patient/caregiver report,  -Patient/Caregiver will call pharmacy for medication refills as evidenced by patient report and review of pharmacy fill history as appropriate,  -Patient/Caregiver will call provider office for new concerns or questions as evidenced by review of documented incoming telephone call notes and  patient report,  -Patient/Caregiver verbalizes understanding of plan, and  -Patient/Caregiver will focus on medication adherence by taking medications as prescribed -drink 6 to 8 glasses of water each day -monitor her food intake, exercise as tolerated 30 a day, and rest.      [name] RN, BSN, [place]  Phone: [phone]

## 2021-04-19 NOTE — Patient Instructions (Signed)
Visit Information  Ms. Scovell  it was nice speaking with you. Please call me directly [phone] if you have questions about the goals we discussed.    Patient Goals/Self Care Activities: -Patient/Caregiver will self-administer medications as prescribed as evidenced by self-report/primary caregiver report ,  -Patient/Caregiver will attend all scheduled provider appointments as evidenced by clinician review of documented attendance to scheduled appointments and patient/caregiver report,  -Patient/Caregiver will call pharmacy for medication refills as evidenced by patient report and review of pharmacy fill history as appropriate,  -Patient/Caregiver will call provider office for new concerns or questions as evidenced by review of documented incoming telephone call notes and patient report,  -Patient/Caregiver verbalizes understanding of plan, and  -Patient/Caregiver will focus on medication adherence by taking medications as prescribed -drink 6 to 8 glasses of water each day -monitor her food intake, exercise as tolerated 30 a day, and rest.    The patient verbalized understanding of instructions, educational materials, and care plan provided today and agreed to receive a mailed copy of patient instructions, educational materials, and care plan.   Follow up Plan: Patient would like continued follow-up.  CCM RNCM will outreach the patient within the next 5 weeks.  Patient will call office if needed prior to next encounter  [name], RN  [phone]

## 2021-04-23 ENCOUNTER — Encounter: Payer: Self-pay | Admitting: Family Medicine

## 2021-04-23 ENCOUNTER — Other Ambulatory Visit: Payer: Self-pay | Admitting: Family Medicine

## 2021-04-26 ENCOUNTER — Encounter: Payer: Self-pay | Admitting: Family Medicine

## 2021-04-26 ENCOUNTER — Other Ambulatory Visit: Payer: Self-pay | Admitting: Family Medicine

## 2021-04-26 DIAGNOSIS — R059 Cough, unspecified: Secondary | ICD-10-CM

## 2021-05-16 ENCOUNTER — Ambulatory Visit: Payer: Medicare Other

## 2021-05-16 NOTE — Patient Instructions (Signed)
Visit Information ? ?Ms. Schuchard  it was nice speaking with you. Please call me directly [phone] if you have questions about the goals we discussed. ? ?Patient Goals/Self Care Activities: ?-Patient/Caregiver will self-administer medications as prescribed as evidenced by self-report/primary caregiver report ,  ?-Patient/Caregiver will attend all scheduled provider appointments as evidenced by clinician review of documented attendance to scheduled appointments and patient/caregiver report,  ?-Patient/Caregiver will call pharmacy for medication refills as evidenced by patient report and review of pharmacy fill history as appropriate,  ?-Patient/Caregiver will call provider office for new concerns or questions as evidenced by review of documented incoming telephone call notes and patient report,  ?-Patient/Caregiver verbalizes understanding of plan, ?-Patient/Caregiver will focus on medication adherence by taking medications as prescribed ?-drink 6 to 8 glasses of water each day ?-monitor her food intake, exercise as tolerated 30 a day, and rest.  ? ? ?Patient verbalizes understanding of instructions and care plan provided today and agrees to view in [place]. Active MyChart status confirmed with patient.   ? ?Follow up Plan: Patient would like continued follow-up.  CCM RNCM will outreach the patient within the next 5 weeks.  Patient will call office if needed prior to next encounter ? ?[name], RN ? ?[phone]  ?

## 2021-05-16 NOTE — Chronic Care Management (AMB) (Signed)
?Chronic Care Management  ? ?CCM RN Visit Note ? ?05/16/2021 ?Name: Frances Medina MRN: [id number] DOB: 1960/03/08 ? ?Subjective: ?Frances Medina is a 61 y.o. year old female who is a primary care patient of [place], Phill Myron, MD. The care management team was consulted for assistance with disease management and care coordination needs.   ? ?Engaged with patient by telephone for follow up visit in response to provider referral for case management and/or care coordination services.  ? ?Consent to Services:  ?The patient was given information about Chronic Care Management services, agreed to services, and gave verbal consent prior to initiation of services.  Please see initial visit note for detailed documentation.  ? ?Patient agreed to services and verbal consent obtained.  ? ? ?Summary:  The patient continues to maintain positive progress with care plan goals. See Care Plan below for interventions and patient self-care actives. ? ?Recommendation: The patient may benefit from taking medications as prescribed, Exercise as tolerated, and Monitor food intake. The patient agrees. ? ?Follow up Plan: Patient would like continued follow-up.  CCM RNCM will outreach the patient within the next 5 weeks.  Patient will call office if needed prior to next encounter ? ? ?Assessment: Review of patient past medical history, allergies, medications, health status, including review of consultants reports, laboratory and other test data, was performed as part of comprehensive evaluation and provision of chronic care management services.  ? ?SDOH (Social Determinants of Health) assessments and interventions performed:  No ? ?CCM Care Plan ? ?Conditions to be addressed/monitored: Weight Management and Breathing ? ?Care Plan : RN Case Manager  ?Updates made by [name], RN since 05/16/2021 12:00 AM  ?  ? ?Problem: Weight Management (Obesity) and Breathing   ?Priority: High  ?Onset Date: 09/26/2020  ?  ? 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 ?RNCM Clinical Goal(s):  ?take all medications exactly as prescribed and will call provider for medication related questions ?demonstrate ongoing self health care management ability through collaboration with RN Care manager, provider, and care team.  ?Over the next 90 days, patient will verbalize basic understanding of plan for initiation of weight loss strategies ? ?Interventions: ?1:1 collaboration with primary care provider regarding development and update of comprehensive plan of care as evidenced by provider attestation and co-signature ?Inter-disciplinary care team collaboration (see longitudinal plan of care) ? ? 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 ? ?Weight Loss:  (Status: Goal on Track (progressing): YES.) Long Term ? ?Evaluation of current treatment plan related to weight loss and patient's adherence to plan as established by provider ?Discussed plans with patient for ongoing care management follow up and provided patient with direct contact  information for care management team ?Weigh monthly ?Be  realistic on weight to lose.   ?activation or motivation to change monitored ?activity and exercise based on tolerance encouraged ?encouragement and support provided ?success praised ?Continue to monitor food and fluid intake ?05/16/21: [name] is doing well. She is walking when she can, and her appetite has decreased. She is back on [place], taking one shot a week. The weight continues to come off. She could not tell me her exact weight today because she has not weighed recently. Her clothes are becoming too big, and she said she has a lot of loose skin. I congratulated her on her efforts. ?The weight loss has helped her breathing; she has not had any problems not having to use her more than usual.  ? ? ?Patient Goals/Self Care Activities: ?-Patient/Caregiver will self-administer medications as prescribed as evidenced by self-report/primary caregiver report ,  ?-Patient/Caregiver will attend all scheduled provider appointments as evidenced by clinician review of documented attendance to scheduled appointments and patient/caregiver report,  ?-Patient/Caregiver will call pharmacy for medication refills as evidenced by patient report and review of pharmacy fill history as appropriate,  ?-Patient/Caregiver will call provider office for new concerns or questions as evidenced by review of documented incoming telephone call notes and patient report,  ?-Patient/Caregiver verbalizes understanding of plan, ?-Patient/Caregiver will focus on medication adherence by taking medications as prescribed ?-drink 6 to 8 glasses of water each day ?-monitor her food intake, exercise as tolerated 30 a day, and rest.  ? ?  ? ?[name] RN, BSN, [place] ?Care Management Coordinator ?[place]  ?Phone: [phone]  ?  ? ? ? ? ? ? ? ? ? ?

## 2021-06-18 ENCOUNTER — Ambulatory Visit: Payer: Medicare Other

## 2021-06-18 NOTE — Patient Instructions (Signed)
Visit Information ? ?Ms. Bomberger  it was nice speaking with you. Please call me directly [phone] if you have questions about the goals we discussed. ? ?  ?Patient Goals/Self Care Activities: ?-Patient/Caregiver will self-administer medications as prescribed as evidenced by self-report/primary caregiver report ,  ?-Patient/Caregiver will attend all scheduled provider appointments as evidenced by clinician review of documented attendance to scheduled appointments and patient/caregiver report,  ?-Patient/Caregiver will call pharmacy for medication refills as evidenced by patient report and review of pharmacy fill history as appropriate,  ?-Patient/Caregiver will call provider office for new concerns or questions as evidenced by review of documented incoming telephone call notes and patient report,  ?-Patient/Caregiver verbalizes understanding of plan, ?-Patient/Caregiver will focus on medication adherence by taking medications as prescribed ?-drink 6 to 8 glasses of water each day ?-monitor her food intake, exercise as tolerated 30 a day, and rest.  ?  ?  ? ? ?Patient verbalizes understanding of instructions and care plan provided today and agrees to view in [place]. Active MyChart status confirmed with patient.   ? ?Follow up Plan: Patient would like continued follow-up.  CCM RNCM will outreach the patient within the next 5 weeks.  Patient will call office if needed prior to next encounter ? ?[name], RN ? ?[phone]  ?

## 2021-06-18 NOTE — Chronic Care Management (AMB) (Signed)
?Chronic Care Management  ? ?CCM RN Visit Note ? ?06/18/2021 ?Name: Frances Medina MRN: [id number] DOB: 03/07/1960 ? ?Subjective: ?Frances Medina is a 61 y.o. year old female who is a primary care patient of Eniola, Phill Myron, MD. The care management team was consulted for assistance with disease management and care coordination needs.   ? ?Engaged with patient by telephone for follow up visit in response to provider referral for case management and/or care coordination services.  ? ?Consent to Services:  ?The patient was given information about Chronic Care Management services, agreed to services, and gave verbal consent prior to initiation of services.  Please see initial visit note for detailed documentation.  ? ?Patient agreed to services and verbal consent obtained.  ? ? ?Summary:  The patient continues to maintain positive progress with care plan goals. See Care Plan below for interventions and patient self-care actives. ? ?Recommendation: The patient may benefit from taking medications as prescribed, exercising as tolerated, monitoring food intake, watch fluid intake, and The patient agrees. ? ?Follow up Plan: Patient would like continued follow-up.  CCM RNCM will outreach the patient within the next 5 weeks.  Patient will call office if needed prior to next encounter ? ? ?Assessment: Review of patient past medical history, allergies, medications, health status, including review of consultants reports, laboratory and other test data, was performed as part of comprehensive evaluation and provision of chronic care management services.  ? ?SDOH (Social Determinants of Health) assessments and interventions performed:  No ? ?CCM Care Plan ? ?Conditions to be addressed/monitored: Weight loss and Breathing ? ?Care Plan : RN Case Manager  ?Updates made by [name], RN since 06/18/2021 12:00 AM  ?  ? ?Problem: Weight Management (Obesity) and Breathing   ?Priority: High  ?Onset Date: 09/26/2020  ?  ? 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 ?RNCM Clinical Goal(s):  ?take all medications exactly as prescribed and will call provider for medication related questions ?demonstrate ongoing self health care management ability of weight loss through collaboration with RN Care manager, provider, and care team.  ?Over the next 90 days, patient will verbalize basic understanding of plan for initiation of weight loss strategies ? ?Interventions: ?1:1 collaboration with primary care provider regarding development and update of comprehensive plan of care as evidenced by provider attestation and co-signature ?Inter-disciplinary care team collaboration (see longitudinal plan of care) ? ? 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 ? ?Weight Loss:  (Status: Goal on Track (progressing): YES.) Long Term ? ?Evaluation of current treatment plan related to weight loss and patient's adherence to plan as established by provider ?Discussed plans with patient for ongoing care management follow up and provided patient with direct contact information for care management team ?Weigh monthly ?Be  realistic on weight to lose.   ?activity and exercise  based on tolerance encouraged ?encouragement and support provided ?success praised ?Continue to monitor food and fluid intake ?06/18/21: I spoke with Frances Medina is doing well. She has lost a total of 50 lbs. It has started to cause pain in her back and knees due to the loose skin in her stomach because it pulls. Her weight started at 280 lbs, and now she is 250 lbs, but she does not like getting on the scale and is afraid of disappointing herself. It has helped with her breathing no problems with any exacerbations. I congratulated her on her work. ? ?Patient Goals/Self Care Activities: ?-Patient/Caregiver will self-administer medications as prescribed as evidenced by self-report/primary caregiver report ,  ?-Patient/Caregiver will attend all scheduled provider appointments as evidenced by clinician review of documented attendance to scheduled appointments and patient/caregiver report,  ?-Patient/Caregiver will call pharmacy for medication refills as evidenced by patient report and review of pharmacy fill history as appropriate,  ?-Patient/Caregiver will call provider office for new concerns or questions as evidenced by review of documented incoming telephone call notes and patient report,  ?-Patient/Caregiver verbalizes understanding of plan, ?-Patient/Caregiver will focus on medication adherence by taking medications as prescribed ?-drink 6 to 8 glasses of water each day ?-monitor her food intake, exercise as tolerated 30 a day, and rest.  ? ?  ? ?[name] RN, BSN, [place] ?Care Management Coordinator ?[place]  ?Phone: [phone]  ?  ? ? ? ? ? ?

## 2021-06-26 DIAGNOSIS — R7303 Prediabetes: Secondary | ICD-10-CM | POA: Diagnosis not present

## 2021-06-26 DIAGNOSIS — Z713 Dietary counseling and surveillance: Secondary | ICD-10-CM | POA: Diagnosis not present

## 2021-06-26 DIAGNOSIS — Z6841 Body Mass Index (BMI) 40.0 and over, adult: Secondary | ICD-10-CM | POA: Diagnosis not present

## 2021-06-26 DIAGNOSIS — Z79899 Other long term (current) drug therapy: Secondary | ICD-10-CM | POA: Diagnosis not present

## 2021-07-01 ENCOUNTER — Other Ambulatory Visit: Payer: Self-pay | Admitting: Family Medicine

## 2021-07-01 DIAGNOSIS — Z1231 Encounter for screening mammogram for malignant neoplasm of breast: Secondary | ICD-10-CM

## 2021-07-10 ENCOUNTER — Ambulatory Visit: Payer: BC Managed Care – PPO

## 2021-07-11 ENCOUNTER — Ambulatory Visit (INDEPENDENT_AMBULATORY_CARE_PROVIDER_SITE_OTHER): Payer: BC Managed Care – PPO | Admitting: Family Medicine

## 2021-07-11 VITALS — BP 122/81 | HR 88 | Ht 60.0 in | Wt 289.0 lb

## 2021-07-11 DIAGNOSIS — L304 Erythema intertrigo: Secondary | ICD-10-CM

## 2021-07-11 MED ORDER — NYSTATIN 100000 UNIT/GM EX POWD
1.0000 | Freq: Three times a day (TID) | CUTANEOUS | 0 refills | Status: DC
Start: 2021-07-11 — End: 2022-07-29

## 2021-07-11 NOTE — Progress Notes (Signed)
? ? ? ?  SUBJECTIVE:  ? ?CHIEF COMPLAINT / HPI:  ? ?Rash ?Located under her arms bilaterally. Ongoing for months and wanted to get it checked out in case it was an infection. Now with pruritis, burning, and odor. She has used vaseline and gold bond with temporary minimal relief. She denies fever and rashes in other areas.  ? ?PERTINENT  PMH / PSH: As above.  ? ?OBJECTIVE:  ? ?BP 122/81   Pulse 88   Ht 5' (1.524 m)   Wt 289 lb (131.1 kg)   SpO2 97%   BMI 56.44 kg/m?   ?General: Appears well, no acute distress. Age appropriate. ?Respiratory: normal effort ?Skin: Bilateral axilla rash that is mildly raised, erythematous and with satellite lesions. ?Neuro: alert and oriented ?Psych: normal affect ?  ?Media Information ?Document Information ? ?  ?Media Information ?Document Information ? ?ASSESSMENT/PLAN:  ? ?Intertrigo ?Ongoing several months.  Located in axilla bilaterally.  Worsening with pruritus, burning, odor.  No other systemic symptoms.  Rash with well demarcated borders slightly raised and erythematous with satellite lesions.  High suspicion for candidiasis.  Will start nystatin powder for 14 days.  Encouraged to follow-up in 1 to 2 weeks if no improvement. ? ?Gerlene Fee, DO ?[place]  ?

## 2021-07-11 NOTE — Patient Instructions (Addendum)
It was wonderful to see you today. ? ?Please bring ALL of your medications with you to every visit.  ? ?Today we talked about: ? ?Rash in the armpits is likely a yeast infection.  Please use powder 3 times daily follow-up in 1 to 2 weeks if no improvement. ? ?Please be sure to schedule follow up at the front  desk before you leave today.  ? ?If you haven't already, sign up for My Chart to have easy access to your labs results, and communication with your primary care physician. ? ?Please call the clinic at [phone] if your symptoms worsen or you have any concerns. It was our pleasure to serve you. ? ?Dr. Janus Molder ? ?

## 2021-07-17 ENCOUNTER — Ambulatory Visit: Payer: Medicare Other

## 2021-07-17 NOTE — Patient Instructions (Signed)
Visit Information ? ?Ms. Reifsteck  it was nice speaking with you. Please call me directly [phone] if you have questions about the goals we discussed. ? ?Patient Goals/Self Care Activities: ?-Patient/Caregiver will self-administer medications as prescribed as evidenced by self-report/primary caregiver report ,  ?-Patient/Caregiver will attend all scheduled provider appointments as evidenced by clinician review of documented attendance to scheduled appointments and patient/caregiver report,  ?-Patient/Caregiver will call pharmacy for medication refills as evidenced by patient report and review of pharmacy fill history as appropriate,  ?-Patient/Caregiver will call provider office for new concerns or questions as evidenced by review of documented incoming telephone call notes and patient report,  ?-Patient/Caregiver verbalizes understanding of plan, ?-Patient/Caregiver will focus on medication adherence by taking medications as prescribed ?-drink 6 to 8 glasses of water each day ?-monitor her food intake, exercise as tolerated 30 a day, and rest.  ?  ? ? ?Patient verbalizes understanding of instructions and care plan provided today and agrees to view in [facility]. Active MyChart status and patient understanding of how to access instructions and care plan via MyChart confirmed with patient.    ? ?Follow up Plan: Patient would like continued follow-up.  CCM RNCM will outreach the patient within the next 8 weeks.  Patient will call office if needed prior to next encounter ? ?[name], RN ? ?[phone]  ?

## 2021-07-17 NOTE — Chronic Care Management (AMB) (Signed)
?Chronic Care Management  ? ?CCM RN Visit Note ? ?07/17/2021 ?Name: Frances Medina MRN: [id number] DOB: 1960-09-26 ? ?Subjective: ?Frances Medina is a 61 y.o. year old female who is a primary care patient of Eniola, Phill Myron, MD. The care management team was consulted for assistance with disease management and care coordination needs.   ? ?Engaged with patient by telephone for follow up visit in response to provider referral for case management and/or care coordination services.  ? ?Consent to Services:  ?The patient was given information about Chronic Care Management services, agreed to services, and gave verbal consent prior to initiation of services.  Please see initial visit note for detailed documentation.  ? ?Patient agreed to services and verbal consent obtained.  ? ? ?Summary:  The patient continues to maintain positive progress with care plan goals. The patient also disclosed that she is experiencing a yeast infection under her arms. Her physician recommended using talcum powder to help alleviate the issue.See Care Plan below for interventions and patient self-care actives. ? ?Recommendation: The patient may benefit from taking medications as prescribed, exercising as tolerated, monitoring food intake, and The patient agrees. ? ?Follow up Plan: Patient would like continued follow-up.  CCM RNCM will outreach the patient within the next 8 weeks.  Patient will call office if needed prior to next encounter ? ? ?Assessment: Review of patient past medical history, allergies, medications, health status, including review of consultants reports, laboratory and other test data, was performed as part of comprehensive evaluation and provision of chronic care management services.  ? ?SDOH (Social Determinants of Health) assessments and interventions performed:  No ? ?CCM Care Plan ? ? ? ?Conditions to be addressed/monitored: Weight loss and Breathing ? ?Care Plan : RN Case Manager  ?Updates made by [name], RN since  07/17/2021 12:00 AM  ?  ? ?Problem: Weight Management (Obesity) and Breathing   ?Priority: High  ?Onset Date: 09/26/2020  ?  ? 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 ?RNCM Clinical Goal(s):  ?take all medications exactly as prescribed and will call provider for medication related questions ?demonstrate ongoing self health care management ability of weight loss through collaboration with RN Care manager, provider, and care team.  ?Over the next 90 days, patient will verbalize basic understanding of plan for initiation of weight loss strategies ? ?Interventions: ?1:1 collaboration with primary care provider regarding development and update of comprehensive plan of care as evidenced by provider attestation and co-signature ?Inter-disciplinary care team collaboration (see longitudinal plan of care) ? ? 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 ? ?Weight Loss:  (Status: Goal on Track (progressing): YES.) Long Term ? ?Evaluation of current treatment plan related to weight loss and patient's adherence to plan as established by provider ?Discussed  plans with patient for ongoing care management follow up and  provided patient with direct contact information for care management team ?Weigh monthly ?Be realistic on weight to lose.   ?activity and exercise based on tolerance encouraged ?encouragement and support provided ?success praised ?Continue to monitor food and fluid intake ?07/17/21: This morning, the patient reported feeling well and having no breathing difficulties. She has been successfully losing weight with the help of the [facility] program. During her last visit with her weight loss physician, she was advised to increase her water intake to facilitate faster weight loss. As a result, she has been exclusively drinking water for the past two weeks. ?The patient also disclosed that she is experiencing a yeast infection under her arms. Her physician recommended using talcum powder to help alleviate the issue. ? ? ?Patient Goals/Self Care Activities: ?-Patient/Caregiver will self-administer medications as prescribed as evidenced by self-report/primary caregiver report ,  ?-Patient/Caregiver will attend all scheduled provider appointments as evidenced by clinician review of documented attendance to scheduled appointments and patient/caregiver report,  ?-Patient/Caregiver will call pharmacy for medication refills as evidenced by patient report and review of pharmacy fill history as appropriate,  ?-Patient/Caregiver will call provider office for new concerns or questions as evidenced by review of documented incoming telephone call notes and patient report,  ?-Patient/Caregiver verbalizes understanding of plan, ?-Patient/Caregiver will focus on medication adherence by taking medications as prescribed ?-drink 6 to 8 glasses of water each day ?-monitor her food intake, exercise as tolerated 30 a day, and rest.  ? ?  ? ?[name] RN, BSN, [place] ?Care Management Coordinator ?[place]  ?Phone: [phone]  ?  ? ? ? ? ? ? ? ? ?

## 2021-07-30 ENCOUNTER — Ambulatory Visit
Admission: RE | Admit: 2021-07-30 | Discharge: 2021-07-30 | Disposition: A | Discharge status: Home / Self Care | Payer: BC Managed Care – PPO | Source: Ambulatory Visit | Attending: Family Medicine | Admitting: Family Medicine

## 2021-07-30 DIAGNOSIS — Z1231 Encounter for screening mammogram for malignant neoplasm of breast: Secondary | ICD-10-CM

## 2021-08-06 ENCOUNTER — Encounter: Payer: Self-pay | Admitting: *Deleted

## 2021-08-28 ENCOUNTER — Ambulatory Visit: Payer: Self-pay | Admitting: Licensed Clinical Social Worker

## 2021-08-28 NOTE — Chronic Care Management (AMB) (Signed)
  Care Management   Social Work Visit Note  08/28/2021  Opened in error!  Lenor Derrick, MSW  Social Worker [facility] Care Management  [phone]

## 2021-09-16 ENCOUNTER — Ambulatory Visit: Payer: BC Managed Care – PPO

## 2021-09-16 NOTE — Patient Instructions (Signed)
Visit Information  Ms. Alperin  it was nice speaking with you. Please call me directly [phone] if you have questions about the goals we discussed.  Patient Goals/Self Care Activities: -Patient/Caregiver will self-administer medications as prescribed as evidenced by self-report/primary caregiver report ,  -Patient/Caregiver will attend all scheduled provider appointments as evidenced by clinician review of documented attendance to scheduled appointments and patient/caregiver report,  -Patient/Caregiver will call pharmacy for medication refills as evidenced by patient report and review of pharmacy fill history as appropriate,  -Patient/Caregiver will call provider office for new concerns or questions as evidenced by review of documented incoming telephone call notes and patient report,  -Patient/Caregiver verbalizes understanding of plan, -Patient/Caregiver will focus on medication adherence by taking medications as prescribed -drink 6 to 8 glasses of water each day -monitor her food intake, exercise as tolerated 30 a day, and rest.    Patient verbalizes understanding of instructions and care plan provided today and agrees to view in [place]. Active MyChart status and patient understanding of how to access instructions and care plan via MyChart confirmed with patient.     Follow up Plan: Patient would like continued follow-up.  CM RNCM will outreach the patient within the next 4 weeks.  Patient will call office if needed prior to next encounter  [name], RN  [phone]

## 2021-09-16 NOTE — Chronic Care Management (AMB) (Signed)
RNCM Care Management Note 09/16/2021 Name: Frances Medina MRN: [id number] DOB: [birth year]   Frances Medina is a 61 y.o. year old female who sees [name], MD for primary care. RNCM was consulted  to assistance patient with  Care Coordination.      Engaged with patient by telephone for follow up visit in response to provider referral for Care Coordination.     Summary:  The patient today.said her breathing is okay. She feels like she's getting a sinus infection, but she hasn't been out in the heat. I advised her to see her doctor if her condition persists despite using her nasal spray. She's working on losing weight, but she has no appetite.See Care Plan below for interventions and patient self-care actives.  Recommendation: The patient may benefit from taking medications as prescribed, exercising as tolerated, monitoring food intake, and The patient agrees.  Follow up Plan: Patient would like continued follow-up.  CM RNCM will outreach the patient within the next 4 weeks.  Patient will call office if needed prior to next encounter   SDOH (Social Determinants of Health) screening and interventions performed today: no     RN Plan of Care for Conditions/Un Met Needs Addressed and Monitored  Patient Care Plan: RN Case Manager     Problem Identified: Weight Management (Obesity) and Breathing   Priority: High  Onset Date: 09/26/2020     Long-Range Goal: Patient will will contniue to lose weight to help with and prevent any excaerbation with her breathing Completed 09/16/2021  Start Date: 09/26/2020  Expected End Date: 08/30/2021  Recent Progress: On track  Priority: High  Note:   Current Barriers:  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  Weight Loss:  (Status: Goal on Track (progressing): YES.) Long Term  Evaluation of current treatment plan related to weight loss and patient's adherence to plan as established by provider Discussed plans with patient for ongoing care management follow up and provided patient with direct contact information for care management team Weigh monthly Be realistic on weight to lose.   activity and exercise based on tolerance encouraged encouragement and support provided success praised Continue to monitor food and fluid intake 09/16/21: I spoke with the patient today. She said her breathing is okay. She feels like she's getting a sinus infection, but she hasn't been out in the heat. I advised her to see her doctor if her condition persists despite using  her nasal spray. She's working on losing weight, but she has no appetite. I explained that she needs to eat to keep her metabolism up and avoid weight gain. I  suggested she try protein shakes or eating six small meals a day.  I will follow up on her progress at our next call.   Patient Goals/Self Care Activities: -Patient/Caregiver will self-administer medications as prescribed as evidenced by self-report/primary caregiver report ,  -Patient/Caregiver will attend all scheduled provider appointments as evidenced by clinician review of documented attendance to scheduled appointments and patient/caregiver report,  -Patient/Caregiver will call pharmacy for medication refills as evidenced by patient report and review of pharmacy fill history as appropriate,  -Patient/Caregiver will call provider office for new concerns or questions as evidenced by review of documented incoming telephone call notes and patient report,  -Patient/Caregiver verbalizes understanding of plan, -Patient/Caregiver will focus on medication adherence by taking medications as prescribed -drink 6 to 8 glasses of water each day -monitor her food intake, exercise as tolerated 30 a day, and rest.   Resolving due to duplicate goal         [name] RN, BSN, [place]  Phone: [phone]

## 2021-09-28 ENCOUNTER — Other Ambulatory Visit: Payer: Self-pay | Admitting: Family Medicine

## 2021-10-01 ENCOUNTER — Ambulatory Visit (INDEPENDENT_AMBULATORY_CARE_PROVIDER_SITE_OTHER): Payer: BC Managed Care – PPO | Admitting: Family Medicine

## 2021-10-01 DIAGNOSIS — J329 Chronic sinusitis, unspecified: Secondary | ICD-10-CM | POA: Diagnosis not present

## 2021-10-01 NOTE — Assessment & Plan Note (Signed)
-  etiology most consistent with sinusitis given history and exam findings. No signs of bacterial etiology thus no indication for antibiotics or imaging at this time -recommended to take flonase 2 sprays per nostril daily and continue to take zyrtec daily, may increase flonase to twice daily if needed in a week -supportive care extensively discussed, warm team for cough and humidifier for congestion along with avoiding triggers such as environmental factors -follow up with PCP as appropriate

## 2021-10-01 NOTE — Patient Instructions (Signed)
It was great seeing you today!  Today we discussed your cough and other symptoms, I believe this is due to a sinus infection that does not require antibiotics. Please take flonase 2 sprays in each nostril daily and continue to take zyrtec daily. Try this for a week, if you are not noticing much improvement then try the flonase twice daily. Warm tea can help with the cough and using a humidifier can help with the congestion.   Please follow up at your next scheduled appointment, if anything arises between now and then, please don't hesitate to contact our office.   Thank you for allowing [place] to be a part of your medical care!  Thank you, Dr. Larae Grooms

## 2021-10-01 NOTE — Progress Notes (Addendum)
    SUBJECTIVE:   CHIEF COMPLAINT / HPI:   Patients presents with ongoing headache, minimally productive cough, rhinorrhea and congestion for the past month. Denies fever, chills, vomiting, diarrhea, dysuria, dyspnea and sick contacts. Aggravating factors include going outside so she tries to minimize her time outdoors. Relieving factors include tylenol. Has been trying to use her flonase daily but uses it most days (often skipping a day or two) and also takes zyrtec daily which helps.   OBJECTIVE:   BP (!) 123/55   Pulse 75   Ht 5' (1.524 m)   Wt 282 lb 2 oz (128 kg)   SpO2 97%   BMI 55.10 kg/m   General: Patient well-appearing, in no acute distress. HEENT: PERRLA, no tonsillar exudates or erythema noted, sclera white, normal conjunctiva, non-bulging TM bilaterally without erythema or drainage, presence of anterior cervical LAD, facial tenderness on palpation bilaterally  CV: RRR, no murmurs or gallops auscultated Resp: CTAB, no wheezing, rales or rhonchi noted, no focal findings, breathing comfortably on room air Ext: radial and distal pulses strong and equal bilaterally  ASSESSMENT/PLAN:   Sinusitis -etiology most consistent with sinusitis given history and exam findings. No signs of bacterial etiology thus no indication for antibiotics or imaging at this time -recommended to take flonase 2 sprays per nostril daily and continue to take zyrtec daily, may increase flonase to twice daily if needed in a week -supportive care extensively discussed, warm team for cough and humidifier for congestion along with avoiding triggers such as environmental factors -follow up with PCP as appropriate      Donney Dice, [place]

## 2021-10-15 ENCOUNTER — Encounter: Payer: Self-pay | Admitting: Family Medicine

## 2021-10-16 ENCOUNTER — Telehealth: Payer: Self-pay | Admitting: Family Medicine

## 2021-10-16 ENCOUNTER — Other Ambulatory Visit: Payer: Self-pay | Admitting: Family Medicine

## 2021-10-16 MED ORDER — SULFAMETHOXAZOLE-TRIMETHOPRIM 800-160 MG PO TABS: 1.0000 | ORAL_TABLET | Freq: Two times a day (BID) | ORAL | 0 refills | Status: AC

## 2021-10-16 NOTE — Telephone Encounter (Signed)
HIPAA compliant callback message left.  Called to check on the patient regarding her MyChart message.  When she returns call, please, help her schedule an appointment with me soon.

## 2021-10-16 NOTE — Telephone Encounter (Signed)
I spoke with the patient. She continues to have persistent sinus symptoms with headache. Flonase is ineffective.  Bactrim prescribed. Return precaution discussed.  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

## 2021-10-18 ENCOUNTER — Ambulatory Visit: Payer: Self-pay

## 2021-10-18 NOTE — Patient Instructions (Addendum)
Goals Addressed               This Visit's Progress     I want to continue on my weight loss journey and monitor my breathing (pt-stated)        Care Coordination Interventions: The patient is doing well. She is avoiding exposure to heat and experiencing no respiratory issues. I suggested modifying her exercise routine. She is also increasing her water intake and monitoring her diet. She has already lost 50 pounds and aims to lose another 50. Active listening / Reflection utilized  Emotional Support Provided           Our next appointment is by telephone on 10/18 at 11 am  Please call the care guide team at [phone] if you need to cancel or reschedule your appointment.   Patient verbalizes understanding of instructions and care plan provided today and agrees to view in [place]. Active MyChart status and patient understanding of how to access instructions and care plan via MyChart confirmed with patient.     [name] RN, BSN, [facility]   Phone: [phone]

## 2021-10-18 NOTE — Patient Outreach (Addendum)
  Care Coordination   Follow Up Visit Note   10/18/2021 Name: Frances Medina MRN: [id number] DOB: 1961-01-24  Frances Medina is a 61 y.o. year old female who sees [name], MD for primary care. I spoke with  Frances Medina by phone today  What matters to the patients health and wellness today?  I am at a standstill with my weight loss.    Goals Addressed               This Visit's Progress     I want to continue on my weight loss journey and monitor my breathing (pt-stated)        Care Coordination Interventions: The patient is doing well. She is avoiding exposure to heat and experiencing no respiratory issues. I suggested modifying her exercise routine. She is also increasing her water intake and monitoring her diet. She has already lost 50 pounds and aims to lose another 50. Active listening / Reflection utilized  Emotional Support Provided         SDOH assessments and interventions completed:  No     Care Coordination Interventions Activated:  Yes  Care Coordination Interventions:  Yes, provided   Follow up plan: Follow up call scheduled for 10/18 at 11 am    Encounter Outcome:  Pt. Scheduled   [name] RN, BSN, [facility]   Phone: [phone]

## 2021-10-28 ENCOUNTER — Other Ambulatory Visit: Payer: Self-pay | Admitting: Family Medicine

## 2021-10-30 DIAGNOSIS — Z713 Dietary counseling and surveillance: Secondary | ICD-10-CM | POA: Diagnosis not present

## 2021-11-26 ENCOUNTER — Encounter: Payer: Self-pay | Admitting: Internal Medicine

## 2021-11-26 ENCOUNTER — Telehealth: Payer: Self-pay | Admitting: Family Medicine

## 2021-11-26 ENCOUNTER — Ambulatory Visit: Payer: BC Managed Care – PPO | Attending: Internal Medicine | Admitting: Internal Medicine

## 2021-11-26 ENCOUNTER — Encounter: Payer: Self-pay | Admitting: Family Medicine

## 2021-11-26 VITALS — BP 144/84 | HR 75 | Ht 60.0 in | Wt 283.8 lb

## 2021-11-26 DIAGNOSIS — I10 Essential (primary) hypertension: Secondary | ICD-10-CM | POA: Diagnosis not present

## 2021-11-26 DIAGNOSIS — R0602 Shortness of breath: Secondary | ICD-10-CM

## 2021-11-26 DIAGNOSIS — R252 Cramp and spasm: Secondary | ICD-10-CM | POA: Diagnosis not present

## 2021-11-26 DIAGNOSIS — G4733 Obstructive sleep apnea (adult) (pediatric): Secondary | ICD-10-CM

## 2021-11-26 DIAGNOSIS — R079 Chest pain, unspecified: Secondary | ICD-10-CM | POA: Diagnosis not present

## 2021-11-26 DIAGNOSIS — Z9989 Dependence on other enabling machines and devices: Secondary | ICD-10-CM | POA: Diagnosis not present

## 2021-11-26 DIAGNOSIS — G7000 Myasthenia gravis without (acute) exacerbation: Secondary | ICD-10-CM | POA: Diagnosis not present

## 2021-11-26 MED ORDER — GABAPENTIN 300 MG PO CAPS: 300.0000 mg | ORAL_CAPSULE | Freq: Two times a day (BID) | ORAL | 1 refills | Status: DC

## 2021-11-26 NOTE — Patient Instructions (Signed)
Medication Instructions:  PLEASE START: MAGNESIUM OXIDE '400mg'$  DAILY- YOU CAN GET THIS FROM YOUR LOCAL PHARMACY OVER THE COUNTER- THIS WILL HELP WITH LEG CRAMPS *If you need a refill on your cardiac medications before your next appointment, please call your pharmacy*  Lab Work: BLOOD WORK TODAY  If you have labs (blood work) drawn today and your tests are completely normal, you will receive your results only by: [place] (if you have MyChart) OR A paper copy in the mail If you have any lab test that is abnormal or we need to change your treatment, we will call you to review the results.  Follow-Up: At [facility], you and your health needs are our priority.  As part of our continuing mission to provide you with exceptional heart care, we have created designated Provider Care Teams.  These Care Teams include your primary Cardiologist (physician) and Advanced Practice Providers (APPs -  Physician Assistants and Nurse Practitioners) who all work together to provide you with the care you need, when you need it.  Your next appointment:   1 year(s)  The format for your next appointment:   In Person  Provider:   Elouise Munroe, MD     Other Instructions  Hypertension, Adult Hypertension is another name for high blood pressure. High blood pressure forces your heart to work harder to pump blood. This can cause problems over time. There are two numbers in a blood pressure reading. There is a top number (systolic) over a bottom number (diastolic). It is best to have a blood pressure that is below 120/80. What are the causes? The cause of this condition is not known. Some other conditions can lead to high blood pressure. What increases the risk? Some lifestyle factors can make you more likely to develop high blood pressure: Smoking. Not getting enough exercise or physical activity. Being overweight. Having too much fat, sugar, calories, or salt (sodium) in your  diet. Drinking too much alcohol. Other risk factors include: Having any of these conditions: Heart disease. Diabetes. High cholesterol. Kidney disease. Obstructive sleep apnea. Having a family history of high blood pressure and high cholesterol. Age. The risk increases with age. Stress. What are the signs or symptoms? High blood pressure may not cause symptoms. Very high blood pressure (hypertensive crisis) may cause: Headache. Fast or uneven heartbeats (palpitations). Shortness of breath. Nosebleed. Vomiting or feeling like you may vomit (nauseous). Changes in how you see. Very bad chest pain. Feeling dizzy. Seizures. How is this treated? This condition is treated by making healthy lifestyle changes, such as: Eating healthy foods. Exercising more. Drinking less alcohol. Your doctor may prescribe medicine if lifestyle changes do not help enough and if: Your top number is above 130. Your bottom number is above 80. Your personal target blood pressure may vary. Follow these instructions at home: Eating and drinking  If told, follow the DASH eating plan. To follow this plan: Fill one half of your plate at each meal with fruits and vegetables. Fill one fourth of your plate at each meal with whole grains. Whole grains include whole-wheat pasta, brown rice, and whole-grain bread. Eat or drink low-fat dairy products, such as skim milk or low-fat yogurt. Fill one fourth of your plate at each meal with low-fat (lean) proteins. Low-fat proteins include fish, chicken without skin, eggs, beans, and tofu. Avoid fatty meat, cured and processed meat, or chicken with skin. Avoid pre-made or processed food. Limit the amount of salt in your diet to  less than 1,500 mg each day. Do not drink alcohol if: Your doctor tells you not to drink. You are pregnant, may be pregnant, or are planning to become pregnant. If you drink alcohol: Limit how much you have to: 0-1 drink a day for women. 0-2  drinks a day for men. Know how much alcohol is in your drink. In the [place] one drink equals one 12 oz bottle of beer (355 mL), one 5 oz glass of wine (148 mL), or one 1 oz glass of hard liquor (44 mL). Lifestyle  Work with your doctor to stay at a healthy weight or to lose weight. Ask your doctor what the best weight is for you. Get at least 30 minutes of exercise that causes your heart to beat faster (aerobic exercise) most days of the week. This may include walking, swimming, or biking. Get at least 30 minutes of exercise that strengthens your muscles (resistance exercise) at least 3 days a week. This may include lifting weights or doing Pilates. Do not smoke or use any products that contain nicotine or tobacco. If you need help quitting, ask your doctor. Check your blood pressure at home as told by your doctor. Keep all follow-up visits. Medicines Take over-the-counter and prescription medicines only as told by your doctor. Follow directions carefully. Do not skip doses of blood pressure medicine. The medicine does not work as well if you skip doses. Skipping doses also puts you at risk for problems. Ask your doctor about side effects or reactions to medicines that you should watch for. Contact a doctor if: You think you are having a reaction to the medicine you are taking. You have headaches that keep coming back. You feel dizzy. You have swelling in your ankles. You have trouble with your vision. Get help right away if: You get a very bad headache. You start to feel mixed up (confused). You feel weak or numb. You feel faint. You have very bad pain in your: Chest. Belly (abdomen). You vomit more than once. You have trouble breathing. These symptoms may be an emergency. Get help right away. Call 911. Do not wait to see if the symptoms will go away. Do not drive yourself to the hospital. Summary Hypertension is another name for high blood pressure. High blood pressure forces  your heart to work harder to pump blood. For most people, a normal blood pressure is less than 120/80. Making healthy choices can help lower blood pressure. If your blood pressure does not get lower with healthy choices, you may need to take medicine. This information is not intended to replace advice given to you by your health care provider. Make sure you discuss any questions you have with your health care provider. Document Revised: 12/06/2020 Document Reviewed: 12/06/2020 Elsevier Patient Education  [place].

## 2021-11-26 NOTE — Progress Notes (Signed)
Cardiology Office Note:    Date:  11/26/2021  ID:  Frances Medina, DOB November 08, 1960, MRN [id number]  PCP:  [name], MD  Cardiologist:  Elouise Munroe, MD  Electrophysiologist:  None   Referring MD: [name], MD   Chief Complaint/Reason for Referral: Follow-up chest pain  History of Present Illness:    Frances Medina is a 61 y.o. female with a history of myasthenia gravis on mycophenolate therapy, morbid obesity, obstructive sleep apnea, restrictive lung disease by pulmonary function testing likely secondary to obesity, and anxiety.   Epigastric pain gone after stopping ibuprofen.    Stress test low risk.   Abnormality noted on stress most likely due to artifact given body habitus.  Cramps in legs since she started losing weight - about 1 year. We discussed mag ox supplementation.   Losing weight on Wegovy. Feeling well.  She is in good spirits as always and notes that her chest pain is most consistent with acid reflux.  She has had no exertional chest discomfort.    The patient denies chest pain, chest pressure, dyspnea at rest or with exertion, palpitations, PND, orthopnea, or leg swelling. Denies cough, fever, chills. Denies nausea, vomiting. Denies syncope or presyncope. Denies dizziness or lightheadedness.  Past Medical History:  Diagnosis Date   Acute respiratory failure ([place])    Acute respiratory failure with hypoxemia (HCC)    Aspiration into airway    Back pain 07/31/2017   Benign neoplasm of rectum    CAP (community acquired pneumonia) 08/05/2015   Depression with anxiety    Diverticulosis    Dysphagia 03/2015    EGD, Dr Carlean Purl. mild antral gastritis, ? due to Ibuprofen.  no stricture but empirically maloney dilated esophagus.    Dysphagia, neurologic    E. coli UTI 04/07/2015   Elevated LDH 05/06/2015   Encounter for routine gynecological examination 10/19/2015   Endotracheally intubated    Enteritis due to Clostridium difficile    Esophageal  dysmotility 11/08/2015   Fibroid uterus    size of a dime   Gastrostomy infection ([place]) 11/08/2015   Gastrostomy infection ([place]) 11/08/2015   GERD (gastroesophageal reflux disease)    hiatal hernia   Hypertension    "went away when I stopped smoking"   Knee injury    Left hip pain 02/18/2017   Migraine    "maybe couple times/month" (03/20/2015)   Mild intermittent asthma 06/22/2017   Myasthenia gravis ([place]) 2017   Myasthenia gravis with acute exacerbation ([place]) 05/15/2015   Osteoarthritis of left knee 12/02/2013   Osteoarthritis of right knee 08/30/2013   Pancreatitis 07/25/2017   Protein-calorie malnutrition, severe ([place]) 08/07/2015   Seasonal allergies    takes Zytrec   Tracheostomy status ([place])    Tubular adenoma of colon    Tumors    "in my stomach"   Umbilical hernia    watching , no plans for surgery at present   Urine incontinence 10/19/2015   VAP (ventilator-associated pneumonia) [facility])     Past Surgical History:  Procedure Laterality Date   BIOPSY  07/05/2019   Procedure: BIOPSY;  Surgeon: Ladene Artist, MD;  Location: [facility];  Service: Endoscopy;;   [place]; 1989   COLONOSCOPY WITH PROPOFOL N/A 09/19/2013   Procedure: COLONOSCOPY WITH PROPOFOL;  Surgeon: Ladene Artist, MD;  Location: [facility];  Service: Endoscopy;  Laterality: N/A;   COLONOSCOPY WITH PROPOFOL N/A 07/05/2019   Procedure: COLONOSCOPY WITH PROPOFOL;  Surgeon: Lucio Edward  T, MD;  Location: [facility];  Service: Endoscopy;  Laterality: N/A;   DILATION AND CURETTAGE OF UTERUS     ESOPHAGOGASTRODUODENOSCOPY N/A 08/10/2015   Procedure: ESOPHAGOGASTRODUODENOSCOPY (EGD);  Surgeon: Gatha Mayer, MD;  Location: [facility] ENDOSCOPY;  Service: Endoscopy;  Laterality: N/A;   ESOPHAGOGASTRODUODENOSCOPY (EGD) WITH PROPOFOL N/A 03/21/2015   Procedure: ESOPHAGOGASTRODUODENOSCOPY (EGD) WITH PROPOFOL;  Surgeon: Gatha Mayer, MD;  Location: [place];  Service: Endoscopy;  Laterality: N/A;    ESOPHAGOGASTRODUODENOSCOPY (EGD) WITH PROPOFOL N/A 07/05/2019   Procedure: ESOPHAGOGASTRODUODENOSCOPY (EGD) WITH PROPOFOL;  Surgeon: Ladene Artist, MD;  [facility]: [facility];  Service: Endoscopy;  Laterality: N/A;   PARTIAL KNEE ARTHROPLASTY Right 08/30/2013   Procedure: RIGHT UNICOMPARTMENTAL KNEE;  Surgeon: Johnny Bridge, MD;  Location: [place];  Service: Orthopedics;  Laterality: Right;   PARTIAL KNEE ARTHROPLASTY Left 12/02/2013   Procedure: LEFT KNEE UNI ARTHROPLASTY;  Surgeon: Johnny Bridge, MD;  Location: [place];  Service: Orthopedics;  Laterality: Left;   PEG PLACEMENT N/A 08/10/2015   Procedure: PERCUTANEOUS ENDOSCOPIC GASTROSTOMY (PEG) PLACEMENT;  Surgeon: Gatha Mayer, MD;  Location: [place];  Service: Endoscopy;  Laterality: N/A;   POLYPECTOMY  07/05/2019   Procedure: POLYPECTOMY;  Surgeon: Ladene Artist, MD;  [facility];  Service: Endoscopy;;   [place]  1990's?   "apparently took out one of my ovaries at the time too cause one's missing"    Current Medications: Current Meds  Medication Sig   acetaminophen (TYLENOL) 500 MG tablet Take 500 mg by mouth in the morning and at bedtime. (Patient not taking: Reported on 12/03/2021)   albuterol (PROVENTIL) (2.5 MG/3ML) 0.083% nebulizer solution USE 1 VIAL IN NEBULIZER EVERY 6 HOURS AS NEEDED FOR SHORTNESS OF BREATH AND WHEEZING   APPLE CIDER VINEGAR PO Take 1 capsule by mouth daily.   Ascorbic Acid (VITAMIN C PO) Take 1,000 mg by mouth daily.   benzonatate (TESSALON) 100 MG capsule TAKE 1 CAPSULE BY MOUTH TWICE A DAY AS NEEDED FOR COUGH (Patient not taking: Reported on 12/03/2021)   cetirizine (ZYRTEC) 10 MG tablet TAKE 1 TABLET (10 MG TOTAL) BY MOUTH DAILY. NO THERAPEUTIC SUBSTITUTION   clonazePAM (KLONOPIN) 0.5 MG tablet TAKE 1 TABLET BY MOUTH TWICE A DAY AS NEEDED FOR ANXIETY   Cyanocobalamin (VITAMIN B12 PO) Take 1 tablet by mouth daily.   docusate sodium  (COLACE) 100 MG capsule Take 1 capsule (100 mg total) by mouth 2 (two) times daily as needed for mild constipation or moderate constipation. (Patient not taking: Reported on 12/03/2021)   ferrous sulfate 325 (65 FE) MG tablet Take 1 tablet (325 mg total) by mouth daily.   Menthol 10 MG LOZG Take 1 lozenge by mouth daily as needed (cough).   montelukast (SINGULAIR) 10 MG tablet TAKE 1 TABLET BY MOUTH EVERYDAY AT BEDTIME   mycophenolate (CELLCEPT) 500 MG tablet Take 1,000 mg by mouth 2 (two) times daily.   nystatin (MYCOSTATIN/NYSTOP) powder Apply 1 application. topically 3 (three) times daily. (Patient not taking: Reported on 12/03/2021)   omeprazole (PRILOSEC) 20 MG capsule TAKE ONE TABLET DAILY AS NEEDED (Patient not taking: Reported on 12/03/2021)   sertraline (ZOLOFT) 50 MG tablet TAKE 3 TABLETS BY MOUTH EVERY DAY   VITAMIN D, CHOLECALCIFEROL, PO Take 1 tablet by mouth daily. otc   WEGOVY 1.7 MG/0.75ML SOAJ Inject 1.7 mg as directed once a week.   [DISCONTINUED] fluticasone (FLONASE) 50 MCG/ACT nasal spray Place 2 sprays  into both nostrils daily.   [DISCONTINUED] gabapentin (NEURONTIN) 300 MG capsule Take 300 mg by mouth 3 (three) times daily as needed (pain).     Allergies:   Ciprofloxacin, Naproxen, Shrimp [shellfish allergy], Dicyclomine, Methocarbamol, Penicillins, Pineapple, and Morphine and related   Social History   Tobacco Use   Smoking status: Former    Packs/day: 1.50    Years: 38.00    Total pack years: 57.00    Types: Cigarettes    Start date: 03/03/1974    Quit date: 05/14/2012    Years since quitting: 9.5   Smokeless tobacco: Never   Tobacco comments:    denies thoughts of restarting  Vaping Use   Vaping Use: Former  Substance Use Topics   Alcohol use: No    Alcohol/week: 0.0 standard drinks of alcohol   Drug use: No     Family History: The patient's family history includes Anemia in her sister; COPD in her brother; Heart disease (age of onset: 37) in her mother;  Hypertension in her father and mother; Leukemia in her sister and sister; Other in her sister; Stroke in her father. There is no history of Colon cancer.  ROS:   Please see the history of present illness.    All other systems reviewed and are negative.  EKGs/Labs/Other Studies Reviewed:    The following studies were reviewed today:  EKG: Normal sinus rhythm, PACs  Recent Labs: 11/26/2021: BUN 12; Creatinine, Ser 0.65; Hemoglobin 11.2; Magnesium 2.2; Platelets 171; Potassium 4.0; Sodium 141  Recent Lipid Panel    Component Value Date/Time   CHOL 190 12/03/2021 1040   TRIG 91 12/03/2021 1040   HDL 48 12/03/2021 1040   CHOLHDL 4.0 12/03/2021 1040   CHOLHDL 2.7 07/25/2017 1846   VLDL 9 07/25/2017 1846   LDLCALC 125 (H) 12/03/2021 1040    Physical Exam:    VS:  BP (!) 144/84   Pulse 75   Ht 5' (1.524 m)   Wt 283 lb 12.8 oz (128.7 kg)   SpO2 94%   BMI 55.43 kg/m     Wt Readings from Last 5 Encounters:  12/03/21 286 lb (129.7 kg)  11/26/21 283 lb 12.8 oz (128.7 kg)  10/01/21 282 lb 2 oz (128 kg)  07/11/21 289 lb (131.1 kg)  03/06/21 292 lb (132.5 kg)    Constitutional: No acute distress Eyes: sclera non-icteric, normal conjunctiva and lids ENMT: normal dentition, moist mucous membranes Cardiovascular: regular rhythm, normal rate, no murmurs. S1 and S2 normal. No jugular venous distention.  Respiratory: clear to auscultation bilaterally GI : normal bowel sounds, soft and nontender. No distention.   MSK: extremities warm, well perfused. No edema.  NEURO: grossly nonfocal exam, moves all extremities. PSYCH: alert and oriented x 3, normal mood and affect.   ASSESSMENT:    1. Essential hypertension   2. Chest pain, unspecified type   3. Morbid obesity ([place])   4. OSA on CPAP   5. Myasthenia gravis ([place])   6. Shortness of breath    PLAN:    Chest pain, unspecified type Epigastric pain Shortness of breath -Stress test was overall low risk.  No significant chest  pain.   Morbid obesity (HCC)-losing weight with Wegovy, feeling well.    Essential hypertension-not currently on antihypertensive therapy.  Blood pressure mildly elevated today, otherwise has been normal.   OSA on CPAP-encourage compliance   Myasthenia gravis (HCC)-stable per PCP  Cramping - consider OTC mag ox supplement.   Total time of encounter:  30 minutes total time of encounter, including 20 minutes spent in face-to-face patient care on the date of this encounter. This time includes coordination of care and counseling regarding above mentioned problem list. Remainder of non-face-to-face time involved reviewing chart documents/testing relevant to the patient encounter and documentation in the medical record. I have independently reviewed documentation from referring provider.   Cherlynn Kaiser, MD, [facility]     Medication Adjustments/Labs and Tests Ordered: Current medicines are reviewed at length with the patient today.  Concerns regarding medicines are outlined above.   Orders Placed This Encounter  Procedures   Basic metabolic panel   CBC   Magnesium   EKG 12-Lead    No orders of the defined types were placed in this encounter.   Patient Instructions  Medication Instructions:  PLEASE START: MAGNESIUM OXIDE '400mg'$  DAILY- YOU CAN GET THIS FROM YOUR LOCAL PHARMACY OVER THE COUNTER- THIS WILL HELP WITH LEG CRAMPS *If you need a refill on your cardiac medications before your next appointment, please call your pharmacy*  Lab Work: BLOOD WORK TODAY  If you have labs (blood work) drawn today and your tests are completely normal, you will receive your results only by: [place] (if you have MyChart) OR A paper copy in the mail If you have any lab test that is abnormal or we need to change your treatment, we will call you to review the results.  Follow-Up: At [facility], you and your health needs are our priority.  As part of our  continuing mission to provide you with exceptional heart care, we have created designated Provider Care Teams.  These Care Teams include your primary Cardiologist (physician) and Advanced Practice Providers (APPs -  Physician Assistants and Nurse Practitioners) who all work together to provide you with the care you need, when you need it.  Your next appointment:   1 year(s)  The format for your next appointment:   In Person  Provider:   Elouise Munroe, MD     Other Instructions  Hypertension, Adult Hypertension is another name for high blood pressure. High blood pressure forces your heart to work harder to pump blood. This can cause problems over time. There are two numbers in a blood pressure reading. There is a top number (systolic) over a bottom number (diastolic). It is best to have a blood pressure that is below 120/80. What are the causes? The cause of this condition is not known. Some other conditions can lead to high blood pressure. What increases the risk? Some lifestyle factors can make you more likely to develop high blood pressure: Smoking. Not getting enough exercise or physical activity. Being overweight. Having too much fat, sugar, calories, or salt (sodium) in your diet. Drinking too much alcohol. Other risk factors include: Having any of these conditions: Heart disease. Diabetes. High cholesterol. Kidney disease. Obstructive sleep apnea. Having a family history of high blood pressure and high cholesterol. Age. The risk increases with age. Stress. What are the signs or symptoms? High blood pressure may not cause symptoms. Very high blood pressure (hypertensive crisis) may cause: Headache. Fast or uneven heartbeats (palpitations). Shortness of breath. Nosebleed. Vomiting or feeling like you may vomit (nauseous). Changes in how you see. Very bad chest pain. Feeling dizzy. Seizures. How is this treated? This condition is treated by making healthy  lifestyle changes, such as: Eating healthy foods. Exercising more. Drinking less alcohol. Your doctor may prescribe medicine if lifestyle changes do not  help enough and if: Your top number is above 130. Your bottom number is above 80. Your personal target blood pressure may vary. Follow these instructions at home: Eating and drinking  If told, follow the DASH eating plan. To follow this plan: Fill one half of your plate at each meal with fruits and vegetables. Fill one fourth of your plate at each meal with whole grains. Whole grains include whole-wheat pasta, brown rice, and whole-grain bread. Eat or drink low-fat dairy products, such as skim milk or low-fat yogurt. Fill one fourth of your plate at each meal with low-fat (lean) proteins. Low-fat proteins include fish, chicken without skin, eggs, beans, and tofu. Avoid fatty meat, cured and processed meat, or chicken with skin. Avoid pre-made or processed food. Limit the amount of salt in your diet to less than 1,500 mg each day. Do not drink alcohol if: Your doctor tells you not to drink. You are pregnant, may be pregnant, or are planning to become pregnant. If you drink alcohol: Limit how much you have to: 0-1 drink a day for women. 0-2 drinks a day for men. Know how much alcohol is in your drink. In the [place] one drink equals one 12 oz bottle of beer (355 mL), one 5 oz glass of wine (148 mL), or one 1 oz glass of hard liquor (44 mL). Lifestyle  Work with your doctor to stay at a healthy weight or to lose weight. Ask your doctor what the best weight is for you. Get at least 30 minutes of exercise that causes your heart to beat faster (aerobic exercise) most days of the week. This may include walking, swimming, or biking. Get at least 30 minutes of exercise that strengthens your muscles (resistance exercise) at least 3 days a week. This may include lifting weights or doing Pilates. Do not smoke or use any products that contain  nicotine or tobacco. If you need help quitting, ask your doctor. Check your blood pressure at home as told by your doctor. Keep all follow-up visits. Medicines Take over-the-counter and prescription medicines only as told by your doctor. Follow directions carefully. Do not skip doses of blood pressure medicine. The medicine does not work as well if you skip doses. Skipping doses also puts you at risk for problems. Ask your doctor about side effects or reactions to medicines that you should watch for. Contact a doctor if: You think you are having a reaction to the medicine you are taking. You have headaches that keep coming back. You feel dizzy. You have swelling in your ankles. You have trouble with your vision. Get help right away if: You get a very bad headache. You start to feel mixed up (confused). You feel weak or numb. You feel faint. You have very bad pain in your: Chest. Belly (abdomen). You vomit more than once. You have trouble breathing. These symptoms may be an emergency. Get help right away. Call 911. Do not wait to see if the symptoms will go away. Do not drive yourself to the hospital. Summary Hypertension is another name for high blood pressure. High blood pressure forces your heart to work harder to pump blood. For most people, a normal blood pressure is less than 120/80. Making healthy choices can help lower blood pressure. If your blood pressure does not get lower with healthy choices, you may need to take medicine. This information is not intended to replace advice given to you by your health care provider. Make sure you discuss any questions  you have with your health care provider. Document Revised: 12/06/2020 Document Reviewed: 12/06/2020 Elsevier Patient Education  [place].

## 2021-11-26 NOTE — Telephone Encounter (Signed)
I called to check on her regarding her Gabapentin. Upon record review, it appears that we agreed to d/c this in 2017, given that her symptoms were mild and intermittent. She had [place] done at [facility] then, which was abnormal. She stated that her symptoms improved for a while, but now she has burning pain in her legs and intermittent weakness. I will refill her Gabapentin, and an appointment was made for reassessment. She is advised to f/u sooner if symptoms worsen. She agreed with the plan.

## 2021-11-27 LAB — CBC
Hematocrit: 36.5 % (ref 34.0–46.6)
Hemoglobin: 11.2 g/dL (ref 11.1–15.9)
MCH: 21.2 pg — ABNORMAL LOW (ref 26.6–33.0)
MCHC: 30.7 g/dL — ABNORMAL LOW (ref 31.5–35.7)
MCV: 69 fL — ABNORMAL LOW (ref 79–97)
Platelets: 171 10*3/uL (ref 150–450)
RBC: 5.29 x10E6/uL — ABNORMAL HIGH (ref 3.77–5.28)
RDW: 18.5 % — ABNORMAL HIGH (ref 11.7–15.4)
WBC: 6.9 10*3/uL (ref 3.4–10.8)

## 2021-11-27 LAB — BASIC METABOLIC PANEL
BUN/Creatinine Ratio: 18 (ref 12–28)
BUN: 12 mg/dL (ref 8–27)
CO2: 25 mmol/L (ref 20–29)
Calcium: 9.3 mg/dL (ref 8.7–10.3)
Chloride: 103 mmol/L (ref 96–106)
Creatinine, Ser: 0.65 mg/dL (ref 0.57–1.00)
Glucose: 82 mg/dL (ref 70–99)
Potassium: 4 mmol/L (ref 3.5–5.2)
Sodium: 141 mmol/L (ref 134–144)
eGFR: 101 mL/min/{1.73_m2} (ref >59)

## 2021-11-27 LAB — MAGNESIUM: Magnesium: 2.2 mg/dL (ref 1.6–2.3)

## 2021-11-28 ENCOUNTER — Other Ambulatory Visit: Payer: Self-pay | Admitting: Family Medicine

## 2021-12-03 ENCOUNTER — Ambulatory Visit (INDEPENDENT_AMBULATORY_CARE_PROVIDER_SITE_OTHER): Payer: BC Managed Care – PPO | Admitting: Family Medicine

## 2021-12-03 ENCOUNTER — Encounter: Payer: Self-pay | Admitting: Family Medicine

## 2021-12-03 VITALS — BP 138/63 | HR 76 | Ht 60.0 in | Wt 286.0 lb

## 2021-12-03 DIAGNOSIS — R059 Cough, unspecified: Secondary | ICD-10-CM

## 2021-12-03 DIAGNOSIS — M545 Low back pain, unspecified: Secondary | ICD-10-CM | POA: Diagnosis not present

## 2021-12-03 DIAGNOSIS — G7000 Myasthenia gravis without (acute) exacerbation: Secondary | ICD-10-CM | POA: Diagnosis not present

## 2021-12-03 DIAGNOSIS — M5416 Radiculopathy, lumbar region: Secondary | ICD-10-CM | POA: Diagnosis not present

## 2021-12-03 DIAGNOSIS — I10 Essential (primary) hypertension: Secondary | ICD-10-CM

## 2021-12-03 DIAGNOSIS — Z23 Encounter for immunization: Secondary | ICD-10-CM

## 2021-12-03 DIAGNOSIS — R070 Pain in throat: Secondary | ICD-10-CM | POA: Diagnosis not present

## 2021-12-03 DIAGNOSIS — J329 Chronic sinusitis, unspecified: Secondary | ICD-10-CM

## 2021-12-03 LAB — POCT RAPID STREP A (OFFICE): Rapid Strep A Screen: NEGATIVE

## 2021-12-03 MED ORDER — FLUTICASONE PROPIONATE 50 MCG/ACT NA SUSP: 2.0000 | Freq: Every day | NASAL | 6 refills | Status: DC

## 2021-12-03 MED ORDER — BACLOFEN 10 MG PO TABS: 10.0000 mg | ORAL_TABLET | Freq: Every evening | ORAL | 0 refills | Status: DC | PRN

## 2021-12-03 MED ORDER — SULFAMETHOXAZOLE-TRIMETHOPRIM 800-160 MG PO TABS: 1.0000 | ORAL_TABLET | Freq: Two times a day (BID) | ORAL | 0 refills | Status: AC

## 2021-12-03 NOTE — Assessment & Plan Note (Signed)
Good response to Gabapentin. May continue Gabapentin for pain. Baclofen added to her regimen MRI spine ordered PT referral discussed and referred due to increase fall risk. F/U as needed.

## 2021-12-03 NOTE — Patient Instructions (Signed)
Back Exercises These exercises help to make your trunk and back strong. They also help to keep the lower back flexible. Doing these exercises can help to prevent or lessen pain in your lower back. If you have back pain, try to do these exercises 2-3 times each day or as told by your doctor. As you get better, do the exercises once each day. Repeat the exercises more often as told by your doctor. To stop back pain from coming back, do the exercises once each day, or as told by your doctor. Do exercises exactly as told by your doctor. Stop right away if you feel sudden pain or your pain gets worse. Exercises Single knee to chest Do these steps 3-5 times in a row for each leg: Lie on your back on a firm bed or the floor with your legs stretched out. Bring one knee to your chest. Grab your knee or thigh with both hands and hold it in place. Pull on your knee until you feel a gentle stretch in your lower back or butt. Keep doing the stretch for 10-30 seconds. Slowly let go of your leg and straighten it. Pelvic tilt Do these steps 5-10 times in a row: Lie on your back on a firm bed or the floor with your legs stretched out. Bend your knees so they point up to the ceiling. Your feet should be flat on the floor. Tighten your lower belly (abdomen) muscles to press your lower back against the floor. This will make your tailbone point up to the ceiling instead of pointing down to your feet or the floor. Stay in this position for 5-10 seconds while you gently tighten your muscles and breathe evenly. Cat-cow Do these steps until your lower back bends more easily: Get on your hands and knees on a firm bed or the floor. Keep your hands under your shoulders, and keep your knees under your hips. You may put padding under your knees. Let your head hang down toward your chest. Tighten (contract) the muscles in your belly. Point your tailbone toward the floor so your lower back becomes rounded like the back of a  cat. Stay in this position for 5 seconds. Slowly lift your head. Let the muscles of your belly relax. Point your tailbone up toward the ceiling so your back forms a sagging arch like the back of a cow. Stay in this position for 5 seconds.  Press-ups Do these steps 5-10 times in a row: Lie on your belly (face-down) on a firm bed or the floor. Place your hands near your head, about shoulder-width apart. While you keep your back relaxed and keep your hips on the floor, slowly straighten your arms to raise the top half of your body and lift your shoulders. Do not use your back muscles. You may change where you place your hands to make yourself more comfortable. Stay in this position for 5 seconds. Keep your back relaxed. Slowly return to lying flat on the floor.  Bridges Do these steps 10 times in a row: Lie on your back on a firm bed or the floor. Bend your knees so they point up to the ceiling. Your feet should be flat on the floor. Your arms should be flat at your sides, next to your body. Tighten your butt muscles and lift your butt off the floor until your waist is almost as high as your knees. If you do not feel the muscles working in your butt and the back of   your thighs, slide your feet 1-2 inches (2.5-5 cm) farther away from your butt. Stay in this position for 3-5 seconds. Slowly lower your butt to the floor, and let your butt muscles relax. If this exercise is too easy, try doing it with your arms crossed over your chest. Belly crunches Do these steps 5-10 times in a row: Lie on your back on a firm bed or the floor with your legs stretched out. Bend your knees so they point up to the ceiling. Your feet should be flat on the floor. Cross your arms over your chest. Tip your chin a little bit toward your chest, but do not bend your neck. Tighten your belly muscles and slowly raise your chest just enough to lift your shoulder blades a tiny bit off the floor. Avoid raising your body  higher than that because it can put too much stress on your lower back. Slowly lower your chest and your head to the floor. Back lifts Do these steps 5-10 times in a row: Lie on your belly (face-down) with your arms at your sides, and rest your forehead on the floor. Tighten the muscles in your legs and your butt. Slowly lift your chest off the floor while you keep your hips on the floor. Keep the back of your head in line with the curve in your back. Look at the floor while you do this. Stay in this position for 3-5 seconds. Slowly lower your chest and your face to the floor. Contact a doctor if: Your back pain gets a lot worse when you do an exercise. Your back pain does not get better within 2 hours after you exercise. If you have any of these problems, stop doing the exercises. Do not do them again unless your doctor says it is okay. Get help right away if: You have sudden, very bad back pain. If this happens, stop doing the exercises. Do not do them again unless your doctor says it is okay. This information is not intended to replace advice given to you by your health care provider. Make sure you discuss any questions you have with your health care provider. Document Revised: 05/02/2020 Document Reviewed: 05/02/2020 Elsevier Patient Education  2023 [employer]vier Inc.  

## 2021-12-03 NOTE — Assessment & Plan Note (Signed)
Weight reviewed. Continue f/u with weight management

## 2021-12-03 NOTE — Assessment & Plan Note (Signed)
Chronic recurrent. Flonase refilled. Rapid strep test neg. COVID 19 pending. I doubt bacterial infection. However, she stated that is the only thing that worked in the past. Unfortunately, given her MG and multiple antibiotic allergy, I am only able to prescribe Bactrim. Consider ENT referral or imaging in the future.

## 2021-12-03 NOTE — Progress Notes (Signed)
    SUBJECTIVE:   CHIEF COMPLAINT / HPI:   Sinus: She c/o nasal congestion, facial pain, ear and throat pain ongoing for one week. Typical of her sinus infection. She endorsed associated night coughing with occasional gagging. She feels the throat pain coming from her tonsils.  Weight: Her Wegovy dose was recently reduced, and she has been gaining weight since then.  Back pain: She c/o Lower back pain that has been ongoing for years but worsening and associated with B/L LL weakness, tingling sensation, and intermittent numbness of her left LL. Sometimes, her leg feels weak, such as she feels she might fall. No recent falls. Back pain is  6/10 at rest but worsens to 10/10 with ambulation.  MG: Compliant with [employer]   PERTINENT  PMH / PSH: PMHx reviewed  OBJECTIVE:   BP 138/63   Pulse 76   Ht 5' (1.524 m)   Wt 286 lb (129.7 kg)   SpO2 98%   BMI 55.86 kg/m   Physical Exam Constitutional:      Appearance: Normal appearance.  HENT:     Head: Normocephalic.     Right Ear: Tympanic membrane normal.     Left Ear: Tympanic membrane normal.     Mouth/Throat:     Mouth: Mucous membranes are moist.     Pharynx: No oropharyngeal exudate or posterior oropharyngeal erythema.  Eyes:     Conjunctiva/sclera: Conjunctivae normal.  Cardiovascular:     Rate and Rhythm: Normal rate and regular rhythm.     Heart sounds: Normal heart sounds.  Pulmonary:     Effort: Pulmonary effort is normal. No respiratory distress.     Breath sounds: Normal breath sounds. No wheezing.  Abdominal:     Palpations: Abdomen is soft.  Musculoskeletal:     Cervical back: Neck supple.     Comments: Gait is slow and steady. Reduce lower back ROM due to pain.   Neurological:     Mental Status: She is alert and oriented to person, place, and time.     Cranial Nerves: No cranial nerve deficit.     Sensory: No sensory deficit.     Gait: Gait normal.      ASSESSMENT/PLAN:   Sinusitis Chronic  recurrent. Flonase refilled. Rapid strep test neg. COVID 19 pending. I doubt bacterial infection. However, she stated that is the only thing that worked in the past. Unfortunately, given her MG and multiple antibiotic allergy, I am only able to prescribe Bactrim. Consider ENT referral or imaging in the future.  Morbid obesity ([place]) Weight reviewed. Continue f/u with weight management  Lumbar radiculopathy Good response to Gabapentin. May continue Gabapentin for pain. Baclofen added to her regimen MRI spine ordered PT referral discussed and referred due to increase fall risk. F/U as needed.  Myasthenia gravis ([place]) Stable     Andrena Mews, MD [place]

## 2021-12-03 NOTE — Assessment & Plan Note (Signed)
Stable

## 2021-12-04 ENCOUNTER — Telehealth: Payer: Self-pay | Admitting: Family Medicine

## 2021-12-04 LAB — NOVEL CORONAVIRUS, NAA: SARS-CoV-2, NAA: NOT DETECTED

## 2021-12-04 LAB — LIPID PANEL
Chol/HDL Ratio: 4 ratio (ref 0.0–4.4)
Cholesterol, Total: 190 mg/dL (ref 100–199)
HDL: 48 mg/dL (ref >39)
LDL Chol Calc (NIH): 125 mg/dL — ABNORMAL HIGH (ref 0–99)
Triglycerides: 91 mg/dL (ref 0–149)
VLDL Cholesterol Cal: 17 mg/dL (ref 5–40)

## 2021-12-04 NOTE — Progress Notes (Signed)
Hello team,  Please, contact patient and let her know that her COVID 19 test is negative.

## 2021-12-04 NOTE — Telephone Encounter (Addendum)
HIPAA compliant callback message left.  Note that her LDL is elevated. Please, advise:  Your cholesterol LDL increased from last year. Your risk for cardiovascular event base on this result is low. We can continue to work on diet change and exercise. Repeat in 1-2 year.   Thanks.

## 2021-12-13 NOTE — Progress Notes (Signed)
Contacted the pt to tell her about her results. Pt understood feedback

## 2021-12-18 ENCOUNTER — Ambulatory Visit: Payer: Self-pay

## 2021-12-18 ENCOUNTER — Encounter: Payer: Self-pay | Admitting: Family Medicine

## 2021-12-18 NOTE — Patient Instructions (Signed)
Visit Information  Thank you for taking time to visit with me today. Please don't hesitate to contact me if I can be of assistance to you.   Following are the goals we discussed today:   Goals Addressed               This Visit's Progress     I want to continue on my weight loss journey and monitor my breathing (pt-stated)        Care Coordination Interventions: Active listening / Reflection utilized  Emotional Support Provided Patient will self administer medications as prescribed Patient will attend all scheduled provider appointments Patient will call pharmacy for medication refills Patient will call provider office for new concerns or questions Move as tolerated and rest when needed Mrs. Way has been suffering from a sinus infection, but despite taking bactrim, she is still experiencing drainage. She has also reported feeling short of breath with exertion. I advised her to contact the doctor's office to update them on her condition, and if she experiences any distress, she should call 911 immediately.        Our next appointment is by telephone on 11/21 at 11 am  Please call the care guide team at [phone] if you need to cancel or reschedule your appointment.   If you are experiencing a Mental Health or [place] or need someone to talk to, please call 1-800-273-TALK (toll free, 24 hour hotline)  Patient verbalizes understanding of instructions and care plan provided today and agrees to view in [place]. Active MyChart status and patient understanding of how to access instructions and care plan via MyChart confirmed with patient.     [name] RN, BSN, [facility]   Phone: [phone]

## 2021-12-18 NOTE — Patient Outreach (Signed)
  Care Coordination   Follow Up Visit Note   12/18/2021 Name: Frances Medina MRN: [id number] DOB: Jun 21, 1960  Frances Medina is a 61 y.o. year old female who sees [name], MD for primary care. I spoke with  Frances Medina by phone today.  What matters to the patients health and wellness today?  Sinus infection    Goals Addressed               This Visit's Progress     I want to continue on my weight loss journey and monitor my breathing (pt-stated)        Care Coordination Interventions: Active listening / Reflection utilized  Emotional Support Provided Patient will self administer medications as prescribed Patient will attend all scheduled provider appointments Patient will call pharmacy for medication refills Patient will call provider office for new concerns or questions Move as tolerated and rest when needed Frances Medina has been suffering from a sinus infection, but despite taking bactrim, she is still experiencing drainage. She has also reported feeling short of breath with exertion. I advised her to contact the doctor's office to update them on her condition, and if she experiences any distress, she should call 911 immediately.        SDOH assessments and interventions completed:  No     Care Coordination Interventions Activated:  Yes  Care Coordination Interventions:  Yes, provided   Follow up plan: Follow up call scheduled for 11/21 11 am    Encounter Outcome:  Pt. Visit Completed   [name] RN, BSN, [facility]   Phone: [phone]

## 2021-12-18 NOTE — Telephone Encounter (Signed)
Called patient regarding message. She reports that she has been having issues with breathing since after visit with PCP on 12/03/21.  She feels that sinus infection is lingering.   Patient is not having SHOB at time of conversation. She reports that she "gets out of breath with going up and down the stairs and getting in and out of the car." Denies chest pain.   She has been using inhaler and breathing treatments when she is having these episodes. She reports that this helps her symptoms.    Advised of ED precautions. Patient would like to avoid going to the ED if possible. *At time of conversation, patient was not having any chest pain, shortness of breath or difficulty breathing.   Will forward to PCP.   Talbot Grumbling, RN

## 2021-12-25 ENCOUNTER — Other Ambulatory Visit: Payer: Self-pay | Admitting: Family Medicine

## 2021-12-27 DIAGNOSIS — Z6841 Body Mass Index (BMI) 40.0 and over, adult: Secondary | ICD-10-CM | POA: Diagnosis not present

## 2021-12-31 ENCOUNTER — Encounter: Payer: Self-pay | Admitting: Family Medicine

## 2021-12-31 ENCOUNTER — Ambulatory Visit (INDEPENDENT_AMBULATORY_CARE_PROVIDER_SITE_OTHER): Payer: BC Managed Care – PPO | Admitting: Family Medicine

## 2021-12-31 VITALS — BP 133/81 | HR 74 | Ht 60.0 in | Wt 285.1 lb

## 2021-12-31 DIAGNOSIS — R0609 Other forms of dyspnea: Secondary | ICD-10-CM

## 2021-12-31 DIAGNOSIS — R053 Chronic cough: Secondary | ICD-10-CM | POA: Diagnosis not present

## 2021-12-31 DIAGNOSIS — G7000 Myasthenia gravis without (acute) exacerbation: Secondary | ICD-10-CM

## 2021-12-31 DIAGNOSIS — R0600 Dyspnea, unspecified: Secondary | ICD-10-CM | POA: Diagnosis not present

## 2021-12-31 DIAGNOSIS — F411 Generalized anxiety disorder: Secondary | ICD-10-CM | POA: Diagnosis not present

## 2021-12-31 MED ORDER — BUDESONIDE 90 MCG/ACT IN AEPB: 1.0000 | INHALATION_SPRAY | Freq: Two times a day (BID) | RESPIRATORY_TRACT | 1 refills | Status: DC

## 2021-12-31 MED ORDER — OMEPRAZOLE 20 MG PO CPDR: DELAYED_RELEASE_CAPSULE | ORAL | 1 refills | Status: DC

## 2021-12-31 MED ORDER — CLONAZEPAM 0.5 MG PO TABS: ORAL_TABLET | ORAL | 1 refills | Status: DC

## 2021-12-31 NOTE — Patient Instructions (Signed)
It was nice seeing you today.  Please, schedule PFT with Dr. Valentina Lucks and Annual wellness exam with the RN team. I have ordered CT f your chest and ECHo to assess the reason for your shortness of breath.  I will also start a steroid inhaler which I will send to your pharmacy soon. The plan is to reconnect you with your pulmonologist.

## 2021-12-31 NOTE — Progress Notes (Signed)
    SUBJECTIVE:   CHIEF COMPLAINT / HPI:   SOB:  Her SOB and cough persist despite A/B treatment for possible bronchitis. She has been needing to use her albuterol at least twice daily. Her cough is non-productive and often triggered by drinking cold drinks. SOB is worse on exertion and relieved by rest. Occasional chest tightness but no wheezing. She denies orthopenia or PND and has no leg swelling.  Back pain:  No change from the baseline. Pain still radiates down both legs. No recent falls. She did not schedule PT appointment but has an upcoming MRI appointment. Gabapentin seems to be working, although it makes her itch. She premedicates by taking Benadryl prior to taking her Gabapentin.  Anxiety: Need medication refilled.    PERTINENT  PMH / PSH: PMHx reviewed  OBJECTIVE:   BP 133/81   Pulse 74   Ht 5' (1.524 m)   Wt 285 lb 2 oz (129.3 kg)   SpO2 97%   BMI 55.68 kg/m   Physical Exam Vitals and nursing note reviewed.  Cardiovascular:     Rate and Rhythm: Normal rate and regular rhythm.     Pulses: Normal pulses.     Heart sounds: Normal heart sounds. No murmur heard. Pulmonary:     Effort: Pulmonary effort is normal. No respiratory distress.     Breath sounds: Normal breath sounds. No wheezing or rhonchi.  Musculoskeletal:     Right lower leg: No edema.     Left lower leg: No edema.     Comments: Gait slow but steady, uses a walking cane  Neurological:     General: No focal deficit present.     Mental Status: She is oriented to person, place, and time.      ASSESSMENT/PLAN:   Dyspnea Dyspnea with chronic cough No signs of infection and she has had multiple A/B treatment. This is likely multifactorial - restrictive lung disease, myasthenia gravis, obesity, and OSA. Per her record, she had her last PFT in 2020, which showed restrictive lung disease. Plan to repeat PFT with Dr. Valentina Lucks - she will schedule. ECHO for SOB, chronic cough in a patient with hx of  smoking ordered. She has not been evaluated by a pulmonologist in many years. I discussed a referral, and she agreed with this. ECHO ordered to assess her LVEF. Trial of ICS discussed pending pulmonology assessment. Med escribed.   Myasthenia gravis ([place]) Follow up with neurology as planned. Continue current regimen.  GAD (generalized anxiety disorder) Klonopin escribed   Andrena Mews, MD [place]

## 2021-12-31 NOTE — Assessment & Plan Note (Signed)
Follow up with neurology as planned. Continue current regimen.

## 2021-12-31 NOTE — Assessment & Plan Note (Signed)
Klonopin escribed

## 2021-12-31 NOTE — Assessment & Plan Note (Signed)
Dyspnea with chronic cough No signs of infection and she has had multiple A/B treatment. This is likely multifactorial - restrictive lung disease, myasthenia gravis, obesity, and OSA. Per her record, she had her last PFT in 2020, which showed restrictive lung disease. Plan to repeat PFT with Dr. Valentina Lucks - she will schedule. ECHO for SOB, chronic cough in a patient with hx of smoking ordered. She has not been evaluated by a pulmonologist in many years. I discussed a referral, and she agreed with this. ECHO ordered to assess her LVEF. Trial of ICS discussed pending pulmonology assessment. Med escribed.

## 2022-01-03 ENCOUNTER — Encounter: Payer: Self-pay | Admitting: Family Medicine

## 2022-01-04 ENCOUNTER — Ambulatory Visit
Admission: RE | Admit: 2022-01-04 | Discharge: 2022-01-04 | Disposition: A | Discharge status: Home / Self Care | Payer: BC Managed Care – PPO | Source: Ambulatory Visit | Attending: Family Medicine | Admitting: Family Medicine

## 2022-01-04 DIAGNOSIS — M4316 Spondylolisthesis, lumbar region: Secondary | ICD-10-CM | POA: Diagnosis not present

## 2022-01-04 DIAGNOSIS — M545 Low back pain, unspecified: Secondary | ICD-10-CM

## 2022-01-04 MED ORDER — GADOPICLENOL 0.5 MMOL/ML IV SOLN
10.0000 mL | Freq: Once | INTRAVENOUS | Status: AC | PRN
  Administered 2022-01-04: 10 mL via INTRAVENOUS

## 2022-01-08 ENCOUNTER — Telehealth: Payer: Self-pay | Admitting: Family Medicine

## 2022-01-08 ENCOUNTER — Encounter: Payer: Self-pay | Admitting: Family Medicine

## 2022-01-08 NOTE — Telephone Encounter (Signed)
HIPAA compliant callback message left.  Need to discuss MRI report with her.

## 2022-01-08 NOTE — Telephone Encounter (Signed)
 -----   Message ----- From: Valerie Roys, CMA Sent: 01/07/2022   7:38 PM EST To: [name], MD Subject: denied                                          Here is the denial reason and the phone number to call and try to get it reversed.  I did attach notes to the original authorization.     Order ID: [id number] Please call [phone]  Clinical Rationale:   Your doctor told us that you have been coughing. Your doctor ordered a CT scan of your chest. A CT is a way to take pictures of the inside of your body. This test should be done when an exam by your doctor and other tests have not explained the reason for the cough. These tests should include a chest x-ray and breathing tests. You also should have been coughing for more than eight weeks, with no improvement, despite treatment by your doctor. We reviewed the notes we have. The notes do not show that the cause of your cough is unexplained. Based on the information we have, this test is not medically necessary. We used [employer] Benefits Management Clinical Guideline titled Imaging of the Chest to make this decision. You may view this guideline at [email or web address]/mbm-[email or web address].

## 2022-01-08 NOTE — Telephone Encounter (Signed)
Information faxed.  Marylyn Appenzeller,CMA

## 2022-01-08 NOTE — Telephone Encounter (Signed)
I called the number provided. They requested that the following document be faxed over to them before  '@Fax'$ : [phone] before 01/10/22. Attention to Appeal Department.  Most recent encounter note. Most recent chest x-ray The most recent lab result Letter of necessity: Find in the letter tab.  Frances Medina, can you help with faxing the information over, please? Thank you.

## 2022-01-09 ENCOUNTER — Ambulatory Visit (INDEPENDENT_AMBULATORY_CARE_PROVIDER_SITE_OTHER): Payer: BC Managed Care – PPO | Admitting: Pharmacist

## 2022-01-09 ENCOUNTER — Encounter: Payer: Self-pay | Admitting: Pharmacist

## 2022-01-09 ENCOUNTER — Ambulatory Visit (HOSPITAL_COMMUNITY): Payer: BC Managed Care – PPO

## 2022-01-09 ENCOUNTER — Ambulatory Visit (HOSPITAL_COMMUNITY)
Admission: RE | Admit: 2022-01-09 | Discharge: 2022-01-09 | Disposition: A | Discharge status: Home / Self Care | Payer: BC Managed Care – PPO | Source: Ambulatory Visit | Attending: Family Medicine | Admitting: Family Medicine

## 2022-01-09 VITALS — Ht 60.0 in | Wt 284.6 lb

## 2022-01-09 DIAGNOSIS — J984 Other disorders of lung: Secondary | ICD-10-CM

## 2022-01-09 DIAGNOSIS — I10 Essential (primary) hypertension: Secondary | ICD-10-CM | POA: Diagnosis not present

## 2022-01-09 DIAGNOSIS — R0609 Other forms of dyspnea: Secondary | ICD-10-CM | POA: Insufficient documentation

## 2022-01-09 DIAGNOSIS — R053 Chronic cough: Secondary | ICD-10-CM | POA: Insufficient documentation

## 2022-01-09 LAB — ECHOCARDIOGRAM COMPLETE
AR max vel: 1.67 cm2
AV Area VTI: 1.77 cm2
AV Area mean vel: 1.61 cm2
AV Mean grad: 6 mmHg
AV Peak grad: 13.7 mmHg
Ao pk vel: 1.85 m/s
Area-P 1/2: 5.27 cm2
S' Lateral: 2.3 cm

## 2022-01-09 NOTE — Assessment & Plan Note (Signed)
Patient has been experiencing increased cough for the past few weeks. She endorses she only uses allergy medications (i.e., montelukast, fluticasone nasal spray, and cetirizine) and has not used albuterol in >1 month. Unclear etiology of chronic cough as lung function is relatively unchanged from PFT in 2018 and 2020. Spirometry evaluation no change compared to previous spirometry.  -no change in treatment plan. Continue taking albuterol PRN. Patient has not yet   picked up new rx for budesonide.  Discussed that she may consider waiting until her pulmonologist appointment to avoid duplicate cost as they may change her inhaler regimen.

## 2022-01-09 NOTE — Progress Notes (Signed)
*  PRELIMINARY RESULTS* Echocardiogram 2D Echocardiogram has been performed.  Frances Medina 01/09/2022, 8:52 AM

## 2022-01-09 NOTE — Patient Instructions (Signed)
Nice to see you again today.   Please plan to keep your visit with your Pulmonologist.

## 2022-01-09 NOTE — Progress Notes (Signed)
   S:    Chief Complaint  Patient presents with   Medication Management    PFT   Frances Medina is a 61 y.o. female who presents for lung function evaluation.  PMH is significant for myasthenia gravis, GERD, OSA, osteoarthritis, and GAD.  Patient was referred and last seen by Primary Care Provider, Dr. Gwendlyn Deutscher, on 12/31/2021.  At last visit, she endorsed dyspnea with chronic cough. Thought to be multifactorial.  Patient reports breathing has recently been worse since getting her flu shot. She originally thought her symptoms were related to a sinus infection. However, symptoms are unchanged from PCP visit. Endorses getting breathless going upstairs. She uses CPAP at night. Former smoker, started at 61 YO and quit >10 years ago. Patient reports atopic sx consistent with allergic rhinitis.  Medication adherence reported.  Patient reports last dose of asthma medications was >1 month ago  Current asthma medications: montelukast (Singulair) 10 mg daily, cetirizine (Zyrtec) 10 mg daily, fluticasone (Flonase), albuterol PRN; has NOT started budesonide   Rescue inhaler use frequency: not using  Level of asthma sx control- in the last 4 weeks: Question Scoring Patient Score  Daytime sx > 2x/week Yes (1)   No (0) 1  Any nighttime waking due to asthma Yes (1)   No (0) 0  Reliever needed >2x/week Yes (1)   No (0) 0  Any activity limitation due to asthma Yes (1)   No (0) 1   Total Score 2  Well controlled - 0, Partly controlled - 1-2, Uncontrolled 3-4  O: Review of Systems  Respiratory:  Positive for cough. Negative for hemoptysis and sputum production.   All other systems reviewed and are negative.   Physical Exam Constitutional:      Appearance: Normal appearance.  Pulmonary:     Effort: Pulmonary effort is normal.  Neurological:     Mental Status: She is alert.  Psychiatric:        Mood and Affect: Mood normal.        Behavior: Behavior normal.    A/P: Patient has been experiencing  increased cough for the past few weeks. She endorses she only uses allergy medications (i.e., montelukast, fluticasone nasal spray, and cetirizine) and has not used albuterol in >1 month. Unclear etiology of chronic cough as lung function is relatively unchanged from PFT in 2018 and 2020. Spirometry evaluation no change compared to previous spirometry.  -no change in treatment plan. Continue taking albuterol PRN. Patient has not yet   picked up new rx for budesonide.  Discussed that she may consider waiting until her pulmonologist appointment to avoid duplicate cost as they may change her inhaler regimen.  -Follow up with pulmonologist on 01/20/2022.   -Reviewed results of pulmonary function tests. Patient verbalized understanding of treatment plan.   Written patient instructions provided.  Total time in face to face counseling 32 minutes.    Follow-up:  Pharmacist PRN. Schedule PCP clinic visit after pulmonologist appointment.  Patient seen with Geraldo Docker, PharmD Candidate, [place],  PharmD Candidate.and Joseph Art, PharmD, PGY2 Pharmacy Resident.

## 2022-01-10 ENCOUNTER — Other Ambulatory Visit: Payer: Self-pay | Admitting: Family Medicine

## 2022-01-10 ENCOUNTER — Encounter: Payer: Self-pay | Admitting: Family Medicine

## 2022-01-10 DIAGNOSIS — Z122 Encounter for screening for malignant neoplasm of respiratory organs: Secondary | ICD-10-CM

## 2022-01-17 ENCOUNTER — Telehealth: Payer: Self-pay

## 2022-01-17 NOTE — Telephone Encounter (Signed)
Called [facility] spoke with Corry. She spoke with the [facility] there. And they suggested that since patient has on CT of the chest with contrast already scheduled. To hold off on getting the CT Lung Cancer Screening, and if the results from the Ct that's already scheduled is clear. Then they will go ahead and clear to have the CT Lung Cancer Screening scheduled. Salvatore Marvel, CMA

## 2022-01-20 ENCOUNTER — Ambulatory Visit (INDEPENDENT_AMBULATORY_CARE_PROVIDER_SITE_OTHER): Payer: BC Managed Care – PPO | Admitting: Pulmonary Disease

## 2022-01-20 ENCOUNTER — Encounter: Payer: Self-pay | Admitting: Pulmonary Disease

## 2022-01-20 VITALS — BP 126/74 | HR 77 | Ht 60.0 in | Wt 285.2 lb

## 2022-01-20 DIAGNOSIS — J4520 Mild intermittent asthma, uncomplicated: Secondary | ICD-10-CM | POA: Diagnosis not present

## 2022-01-20 DIAGNOSIS — J984 Other disorders of lung: Secondary | ICD-10-CM

## 2022-01-20 DIAGNOSIS — G4733 Obstructive sleep apnea (adult) (pediatric): Secondary | ICD-10-CM

## 2022-01-20 MED ORDER — FLUTICASONE-SALMETEROL 115-21 MCG/ACT IN AERO: 2.0000 | INHALATION_SPRAY | Freq: Two times a day (BID) | RESPIRATORY_TRACT | 12 refills | Status: DC | PRN

## 2022-01-20 NOTE — Patient Instructions (Addendum)
We will check full pulmonary function tests.   Try Advair HFA inhaler 1-2 puffs twice daily as needed for the cough, wheezing or dyspnea - rinse mouth out after each use  Continue to use albuterol inhaler 1-2 puffs every 4-6 hours as needed.   We will check a high resolution CT Chest scan to evaluate the restrictive defect on your recent breathing tests.  Follow up in 2 months.

## 2022-01-20 NOTE — Progress Notes (Signed)
Synopsis: Referred in November 2023 for cough and shortness of breath by Andrena Mews, MD  Subjective:   PATIENT ID: Frances Medina GENDER: female DOB: 09-20-1960, MRN: [id number]  HPI  Chief Complaint  Patient presents with   Consult    Referred by PCP for cough and SOB. States this has been doing on for the past few months. The SOB occurs after coughing fits.    Frances Medina is a 61 year old woman, former smoker with GERD, hypertension, OSA on CPAP, seasonal allergies, positive ANA in 2017, myasthenia gravis and asthma who is referred to pulmonary clinic for cough and dyspnea.   PFTs in 2018 showed mild restrictive defect and significant bronchodilator response with out evidence of obstruction.   In 2017 she reports being in ICU and had trach placed and was diagnosed with myasthenia gravis. She is currently followed by [facility].   She reports intermittent cough with phlegm production that is clear in color. She does have sinus congestion and post nasal drainage. She has intermittent wheezing when the cough is bad. She is using as needed albuterol inhaler and nebulizer treatments. She is not using these medications on a weekly basis. She also has headaches. She reports developing a rash on her arms and legs about 1 week ago. She is in a bariatric weight loss program and has lost 45lbs since last year after starting [facility] and bariatric diet.  She is not currently working and has been on disability since her critical illness in 2017. 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 She is the primary care taker for her mother-in-law. She quit smoking 10 years ago and smoked for 37 years, half a pack to 1 pack per day. She has 3 children, boy and 2 girls. Her youngest is being recruited to play basketball at [employer].  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   Past Medical History:  Diagnosis Date   Acute respiratory failure  ([place])    Acute respiratory failure with hypoxemia (HCC)    Aspiration into airway    Back pain 07/31/2017   Benign neoplasm of rectum    CAP (community acquired pneumonia) 08/05/2015   Depression with anxiety    Diverticulosis    Dysphagia 03/2015    EGD, Dr Carlean Purl. mild antral gastritis, ? due to Ibuprofen.  no stricture but empirically maloney dilated esophagus.    Dysphagia, neurologic    E. coli UTI 04/07/2015   Elevated LDH 05/06/2015   Encounter for routine gynecological examination 10/19/2015   Endotracheally intubated    Enteritis due to Clostridium difficile    Esophageal dysmotility 11/08/2015   Fibroid uterus    size of a dime   Gastrostomy infection ([place]) 11/08/2015   Gastrostomy infection ([place]) 11/08/2015   GERD (gastroesophageal reflux disease)    hiatal hernia   Hypertension    "went away when I stopped smoking"   Knee injury    Left hip pain 02/18/2017   Migraine    "maybe couple times/month" (03/20/2015)   Mild intermittent asthma 06/22/2017   Myasthenia gravis ([place]) 2017   Myasthenia gravis with acute exacerbation ([place]) 05/15/2015   Osteoarthritis of left knee 12/02/2013   Osteoarthritis of right knee 08/30/2013   Pancreatitis 07/25/2017   Protein-calorie malnutrition, severe ([place]) 08/07/2015   Seasonal allergies    takes Zytrec   Tracheostomy status ([place])    Tubular adenoma of colon  Tumors    "in my stomach"   Umbilical hernia    watching , no plans for surgery at present   Urine incontinence 10/19/2015   VAP (ventilator-associated pneumonia) ([place])      Family History  Problem Relation Age of Onset   Heart disease Mother 30   Hypertension Mother    Stroke Father    Hypertension Father    Other Sister        Esophageal strictures   Anemia Sister    COPD Brother    Leukemia Sister    Leukemia Sister    Colon cancer Neg Hx      Social History   Socioeconomic History   Marital status: Married    Spouse name: Not on file   Number of children: 3   Years  of education: Not on file   Highest education level: Not on file  Occupational History   Occupation: disabled  Tobacco Use   Smoking status: Former    Packs/day: 1.50    Years: 38.00    Total pack years: 57.00    Types: Cigarettes    Start date: 03/03/1974    Quit date: 05/14/2012    Years since quitting: 9.7   Smokeless tobacco: Never   Tobacco comments:    denies thoughts of restarting  Vaping Use   Vaping Use: Former  Substance and Sexual Activity   Alcohol use: No    Alcohol/week: 0.0 standard drinks of alcohol   Drug use: No   Sexual activity: Not Currently    Birth control/protection: Surgical  Other Topics Concern   Not on file  Social History Narrative   Work or School: [employer] Situation: lives with husband and daughters      Spiritual Beliefs: Christian      Lifestyle: no regular exercise; trying to eat healthy      [facility] (09/11/16):   Originally from [facility]. Has always lived in [place]. No pets currently. Does have carpet in her home in her bedroom. No bird exposure. Previously had mold under her kitchen sink. No indoor plants. Previously was working in [employer].          Social Determinants of Health   Financial Resource Strain: Not on file  Food Insecurity: Not on file  Transportation Needs: Not on file  Physical Activity: Unknown (05/16/2019)   Exercise Vital Sign    Days of Exercise per Week: 3 days    Minutes of Exercise per Session: Not on file  Stress: Not on file  Social Connections: Not on file  Intimate Partner Violence: Not on file     Allergies  Allergen Reactions   Ciprofloxacin Shortness Of Breath and Rash   Naproxen Shortness Of Breath   Shrimp [Shellfish Allergy] Hives and Shortness Of Breath    ER required   Dicyclomine Hives   Methocarbamol Hives   Penicillins Hives    Has patient had a PCN reaction causing immediate rash, facial/tongue/throat swelling, SOB or lightheadedness  with hypotension: Yes Has patient had a PCN reaction causing severe rash involving mucus membranes or skin necrosis: No Has patient had a PCN reaction that required hospitalization No Has patient had a PCN reaction occurring within the last 10 years: No If all of the above answers are "NO", then may proceed with Cephalosporin use.    Pineapple Itching    Itchy lips and tongue   Morphine And Related Other (See Comments)  Severe headache     Outpatient Medications Prior to Visit  Medication Sig Dispense Refill   acetaminophen (TYLENOL) 500 MG tablet Take 500 mg by mouth in the morning and at bedtime.     albuterol (PROVENTIL) (2.5 MG/3ML) 0.083% nebulizer solution USE 1 VIAL IN NEBULIZER EVERY 6 HOURS AS NEEDED FOR SHORTNESS OF BREATH AND WHEEZING 150 mL 0   albuterol (VENTOLIN HFA) 108 (90 Base) MCG/ACT inhaler INHALE 2 PUFFS INTO THE LUNGS EVERY 6 HOURS AS NEEDED FOR WHEEZE OR SHORTNESS OF BREATH 18 g 3   APPLE CIDER VINEGAR PO Take 1 capsule by mouth daily.     Ascorbic Acid (VITAMIN C PO) Take 1,000 mg by mouth daily.     baclofen (LIORESAL) 10 MG tablet TAKE 1 TABLET BY MOUTH AT BEDTIME AS NEEDED FOR MUSCLE SPASMS 90 tablet 1   benzonatate (TESSALON) 100 MG capsule TAKE 1 CAPSULE BY MOUTH TWICE A DAY AS NEEDED FOR COUGH 20 capsule 1   cetirizine (ZYRTEC) 10 MG tablet TAKE 1 TABLET (10 MG TOTAL) BY MOUTH DAILY. NO THERAPEUTIC SUBSTITUTION 90 tablet 2   clonazePAM (KLONOPIN) 0.5 MG tablet TAKE 1 TABLET BY MOUTH TWICE A DAY AS NEEDED FOR ANXIETY 60 tablet 1   Cyanocobalamin (VITAMIN B12 PO) Take 1 tablet by mouth daily.     fluticasone (FLONASE) 50 MCG/ACT nasal spray Place 2 sprays into both nostrils daily. 16 g 6   gabapentin (NEURONTIN) 300 MG capsule Take 1 capsule (300 mg total) by mouth 2 (two) times daily. 60 capsule 1   Menthol 10 MG LOZG Take 1 lozenge by mouth daily as needed (cough).     montelukast (SINGULAIR) 10 MG tablet TAKE 1 TABLET BY MOUTH EVERYDAY AT BEDTIME 90 tablet  1   mycophenolate (CELLCEPT) 500 MG tablet Take 1,000 mg by mouth 2 (two) times daily.     nystatin (MYCOSTATIN/NYSTOP) powder Apply 1 application. topically 3 (three) times daily. 15 g 0   omeprazole (PRILOSEC) 20 MG capsule TAKE ONE TABLET DAILY AS NEEDED 90 capsule 1   Semaglutide-Weight Management 2.4 MG/0.75ML SOAJ Inject 2.4 mg as directed once a week.     sertraline (ZOLOFT) 50 MG tablet TAKE 3 TABLETS BY MOUTH EVERY DAY 270 tablet 1   VITAMIN D, CHOLECALCIFEROL, PO Take 1 tablet by mouth daily. otc     Budesonide 90 MCG/ACT inhaler Inhale 1 puff into the lungs 2 (two) times daily. (Patient not taking: Reported on 01/09/2022) 1 each 1   docusate sodium (COLACE) 100 MG capsule Take 1 capsule (100 mg total) by mouth 2 (two) times daily as needed for mild constipation or moderate constipation. (Patient not taking: Reported on 12/03/2021) 10 capsule 0   ferrous sulfate 325 (65 FE) MG tablet Take 1 tablet (325 mg total) by mouth daily. (Patient not taking: Reported on 01/09/2022) 90 tablet 3   No facility-administered medications prior to visit.    Review of Systems  Constitutional:  Negative for chills, fever, malaise/fatigue and weight loss.  HENT:  Positive for congestion. Negative for sinus pain and sore throat.   Eyes: Negative.   Respiratory:  Positive for cough, sputum production and shortness of breath. Negative for hemoptysis and wheezing.   Cardiovascular:  Negative for chest pain, palpitations, orthopnea, claudication and leg swelling.  Gastrointestinal:  Positive for heartburn. Negative for abdominal pain, nausea and vomiting.  Genitourinary: Negative.   Musculoskeletal:  Positive for joint pain. Negative for myalgias.  Skin:  Positive for rash.  Neurological:  Positive  for headaches. Negative for weakness.  Endo/Heme/Allergies:  Positive for environmental allergies.  Psychiatric/Behavioral:  Positive for depression. The patient is nervous/anxious.       Objective:    Vitals:   01/20/22 0829  BP: 126/74  Pulse: 77  SpO2: 97%  Weight: 285 lb 3.2 oz (129.4 kg)  Height: 5' (1.524 m)     Physical Exam Constitutional:      General: She is not in acute distress.    Appearance: She is obese. She is not ill-appearing.  HENT:     Head: Normocephalic and atraumatic.  Eyes:     General: No scleral icterus.    Conjunctiva/sclera: Conjunctivae normal.     Pupils: Pupils are equal, round, and reactive to light.  Cardiovascular:     Rate and Rhythm: Normal rate and regular rhythm.     Pulses: Normal pulses.     Heart sounds: Normal heart sounds. No murmur heard. Pulmonary:     Effort: Pulmonary effort is normal.     Breath sounds: Normal breath sounds. No wheezing, rhonchi or rales.  Abdominal:     General: Bowel sounds are normal.     Palpations: Abdomen is soft.  Musculoskeletal:     Right lower leg: No edema.     Left lower leg: No edema.  Lymphadenopathy:     Cervical: No cervical adenopathy.  Skin:    General: Skin is warm and dry.  Neurological:     General: No focal deficit present.     Mental Status: She is alert.  Psychiatric:        Mood and Affect: Mood normal.        Behavior: Behavior normal.        Thought Content: Thought content normal.        Judgment: Judgment normal.       CBC    Component Value Date/Time   WBC 6.9 11/26/2021 0903   WBC 8.3 05/05/2020 0332   RBC 5.29 (H) 11/26/2021 0903   RBC 5.35 (H) 05/05/2020 0332   HGB 11.2 11/26/2021 0903   HGB 12.4 10/30/2015 1112   HCT 36.5 11/26/2021 0903   HCT 38.9 10/30/2015 1112   PLT 171 11/26/2021 0903   MCV 69 (L) 11/26/2021 0903   MCV 72.2 (L) 10/30/2015 1112   MCH 21.2 (L) 11/26/2021 0903   MCH 22.1 (L) 05/05/2020 0332   MCHC 30.7 (L) 11/26/2021 0903   MCHC 30.3 05/05/2020 0332   RDW 18.5 (H) 11/26/2021 0903   RDW 19.0 (H) 10/30/2015 1112   LYMPHSABS 2.1 05/02/2020 2319   LYMPHSABS 1.8 05/02/2019 1212   LYMPHSABS 1.0 10/30/2015 1112   MONOABS 0.4  05/02/2020 2319   MONOABS 0.4 10/30/2015 1112   EOSABS 0.0 05/02/2020 2319   EOSABS 0.0 05/02/2019 1212   BASOSABS 0.0 05/02/2020 2319   BASOSABS 0.0 05/02/2019 1212   BASOSABS 0.0 10/30/2015 1112      Latest Ref Rng & Units 11/26/2021    9:03 AM 05/05/2020    3:32 AM 05/04/2020    4:20 AM  BMP  Glucose 70 - 99 mg/dL 82  109  111   BUN 8 - 27 mg/dL '12  14  8   '$ Creatinine 0.57 - 1.00 mg/dL 0.65  0.66  0.64   BUN/Creat Ratio 12 - 28 18     Sodium 134 - 144 mmol/L 141  141  139   Potassium 3.5 - 5.2 mmol/L 4.0  4.1  3.3   Chloride 96 -  106 mmol/L 103  104  103   CO2 20 - 29 mmol/L '25  27  26   '$ Calcium 8.7 - 10.3 mg/dL 9.3  9.5  9.2    Chest imaging: CTA Chest 05/03/20 Cardiovascular: Satisfactory pulmonary artery opacification but there is bolus dispersion, artifact from body habitus, and multiple areas of streak artifact. No visible pulmonary embolism. Normal heart size. No pericardial effusion. Negative aorta.   Mediastinum/Nodes: Negative for adenopathy or mass.   Lungs/Pleura: Band of atelectasis at the bases, subsegmental. There is no edema, consolidation, effusion, or pneumothorax.   Upper Abdomen: No acute finding.   Musculoskeletal: Spondylosis with bridging osteophytes in the lower thoracic spine.  PFT:    Latest Ref Rng & Units 10/27/2016    9:36 AM  PFT Results  FVC-Pre L 1.57   FVC-Predicted Pre % 66   FVC-Post L 1.66   FVC-Predicted Post % 70   Pre FEV1/FVC % % 83   Post FEV1/FCV % % 92   FEV1-Pre L 1.30   FEV1-Predicted Pre % 70   FEV1-Post L 1.52   DLCO uncorrected ml/min/mmHg 15.92   DLCO UNC% % 84   DLCO corrected ml/min/mmHg 15.64   DLCO COR %Predicted % 82   DLVA Predicted % 131   TLC L 3.49   TLC % Predicted % 78   RV % Predicted % 100     Labs:  Path:  Echo:  Heart Catheterization:  Sleep Study CPAP Titration 2021 Date of NPSG, Split Night or HST:   NPSG 11/19/15  AHI 10/ hr, desatturation to 86%, body weight 264 lbs     IMPRESSIONS - The optimal PAP pressure was 12 cm of water. - Central sleep apnea was not noted during this titration (CAI = 0.2/h). - Significant oxygen desaturations were not observed during this titration (min O2 = 90.0%). - No snoring was audible during this study. - No cardiac abnormalities were observed during this study. - Clinically significant periodic limb movements were not noted during this study. Arousals associated with PLMs were rare.  Assessment & Plan:   Mild intermittent reactive airway disease without complication - Plan: fluticasone-salmeterol (ADVAIR HFA) 115-21 MCG/ACT inhaler, Pulmonary Function Test  Restrictive lung disease - Plan: CT CHEST HIGH RESOLUTION, Pulmonary Function Test  OSA on CPAP  Discussion: Frances Medina is a 61 year old woman, former smoker with GERD, hypertension, OSA on CPAP, seasonal allergies, positive ANA in 2017, myasthenia gravis and asthma who is referred to pulmonary clinic for cough and dyspnea.   She appears to have intermittent reactive airways disease with cough and wheezing that is relieved by albuterol. She is to try advair HFA 115-81mg 2 puffs twice daily as needed and monitor for relief.   We will check full pulmonary function tests to further evaluate the restrictive defect noted on recent spirometry. The restrictive defect is likely from her weight and we discussed this. [facility]   We will check a HRCT chest to rule out any ILD involvement with the restrictive defect.   Follow up in 2 months with PFTs.   JFreda Jackson MD [facility] Office: [phone]  Current Outpatient Medications:    acetaminophen (TYLENOL) 500 MG tablet, Take 500 mg by mouth in the morning and at bedtime., Disp: , Rfl:    albuterol (PROVENTIL) (2.5 MG/3ML) 0.083% nebulizer solution, USE 1 VIAL IN NEBULIZER EVERY 6 HOURS AS NEEDED FOR SHORTNESS  OF BREATH  AND WHEEZING, Disp: 150 mL, Rfl: 0   albuterol (VENTOLIN HFA) 108 (90 Base) MCG/ACT inhaler, INHALE 2 PUFFS INTO THE LUNGS EVERY 6 HOURS AS NEEDED FOR WHEEZE OR SHORTNESS OF BREATH, Disp: 18 g, Rfl: 3   APPLE CIDER VINEGAR PO, Take 1 capsule by mouth daily., Disp: , Rfl:    Ascorbic Acid (VITAMIN C PO), Take 1,000 mg by mouth daily., Disp: , Rfl:    baclofen (LIORESAL) 10 MG tablet, TAKE 1 TABLET BY MOUTH AT BEDTIME AS NEEDED FOR MUSCLE SPASMS, Disp: 90 tablet, Rfl: 1   benzonatate (TESSALON) 100 MG capsule, TAKE 1 CAPSULE BY MOUTH TWICE A DAY AS NEEDED FOR COUGH, Disp: 20 capsule, Rfl: 1   cetirizine (ZYRTEC) 10 MG tablet, TAKE 1 TABLET (10 MG TOTAL) BY MOUTH DAILY. NO THERAPEUTIC SUBSTITUTION, Disp: 90 tablet, Rfl: 2   clonazePAM (KLONOPIN) 0.5 MG tablet, TAKE 1 TABLET BY MOUTH TWICE A DAY AS NEEDED FOR ANXIETY, Disp: 60 tablet, Rfl: 1   Cyanocobalamin (VITAMIN B12 PO), Take 1 tablet by mouth daily., Disp: , Rfl:    fluticasone (FLONASE) 50 MCG/ACT nasal spray, Place 2 sprays into both nostrils daily., Disp: 16 g, Rfl: 6   fluticasone-salmeterol (ADVAIR HFA) 115-21 MCG/ACT inhaler, Inhale 2 puffs into the lungs every 12 (twelve) hours as needed., Disp: 1 each, Rfl: 12   gabapentin (NEURONTIN) 300 MG capsule, Take 1 capsule (300 mg total) by mouth 2 (two) times daily., Disp: 60 capsule, Rfl: 1   Menthol 10 MG LOZG, Take 1 lozenge by mouth daily as needed (cough)., Disp: , Rfl:    montelukast (SINGULAIR) 10 MG tablet, TAKE 1 TABLET BY MOUTH EVERYDAY AT BEDTIME, Disp: 90 tablet, Rfl: 1   mycophenolate (CELLCEPT) 500 MG tablet, Take 1,000 mg by mouth 2 (two) times daily., Disp: , Rfl:    nystatin (MYCOSTATIN/NYSTOP) powder, Apply 1 application. topically 3 (three) times daily., Disp: 15 g, Rfl: 0   omeprazole (PRILOSEC) 20 MG capsule, TAKE ONE TABLET DAILY AS NEEDED, Disp: 90 capsule, Rfl: 1   Semaglutide-Weight Management 2.4 MG/0.75ML SOAJ, Inject 2.4 mg as directed once a week., Disp: , Rfl:     sertraline (ZOLOFT) 50 MG tablet, TAKE 3 TABLETS BY MOUTH EVERY DAY, Disp: 270 tablet, Rfl: 1   VITAMIN D, CHOLECALCIFEROL, PO, Take 1 tablet by mouth daily. otc, Disp: , Rfl:

## 2022-01-21 ENCOUNTER — Telehealth: Payer: Self-pay

## 2022-01-21 NOTE — Patient Outreach (Signed)
  Care Coordination   01/21/2022 Name: Frances Medina MRN: [id number] DOB: 01/20/1961   Care Coordination Outreach Attempts:  An unsuccessful telephone outreach was attempted today to offer the patient information about available care coordination services as a benefit of their health plan.   Follow Up Plan:  Additional outreach attempts will be made to offer the patient care coordination information and services.   Encounter Outcome:  No Answer  Care Coordination Interventions Activated:  No   Care Coordination Interventions:  No, not indicated    [name] RN, BSN, [facility]   Phone: [phone]

## 2022-01-27 ENCOUNTER — Encounter: Payer: Self-pay | Admitting: Pulmonary Disease

## 2022-01-27 ENCOUNTER — Ambulatory Visit (INDEPENDENT_AMBULATORY_CARE_PROVIDER_SITE_OTHER): Payer: BC Managed Care – PPO | Admitting: Family Medicine

## 2022-01-27 VITALS — BP 122/84 | HR 86 | Ht 60.0 in | Wt 286.0 lb

## 2022-01-27 DIAGNOSIS — R21 Rash and other nonspecific skin eruption: Secondary | ICD-10-CM | POA: Diagnosis not present

## 2022-01-27 NOTE — Progress Notes (Signed)
SUBJECTIVE:   CHIEF COMPLAINT / HPI:  Frances Medina is a 61 y.o. female with a past medical history of myasthenia gravis, allergic rhinosinusitis, OSA on CPAP, and GERD presenting to the clinic for 2 months of progressively worsening pruritic diffuse rash.  Rash Patient has had a rash since last clinic visit on 10/3 (almost 2 months ago), but at that time rash was hardly noticeable and restricted to her wrists.  Since then, rash has progressively spread across her body sparing the palms and soles of feet and has become intensely pruritic in nature.  Patient is having trouble sleeping at night due to her itching and she states that recently has begun to burn.  She has noted some relief with Benadryl and is currently taking cetirizine 10 mg 2-3 times a day.  Of note, patient has a standing diagnosis of myasthenia gravis.  Patient denies angioedema, wheezing, fever, and myalgias/arthralgias.  She endorses some shortness of breath but states that she has had this at baseline for quite some time.  Patient also reports that the rash is not evanescent and appears to be constantly present.  After her tracheotomy during myasthenia gravis hospitalization in 05/2020, she has been using albuterol inhaler and a albuterol nebulizer twice a day, alternating the two.  She is prescribed Advair a week ago by her pulmonologist and has not yet picked it up from the pharmacy.  Patient has a couple of possible new environmental exposures recent times including a new laundry detergent which she has been using 3 months ago.  She also has pungent air freshener in the house that her husband enjoys using.  She was also taking newly prescribed baclofen and gabapentin, but discontinued them or 2 weeks ago without any improvement of symptoms since.   PERTINENT  PMH / PSH: Myasthenia gravis, allergic rhinosinusitis, OSA on CPAP, shellfish and environmental allergies  Patient Care Team: [name], MD as PCP - General  (Family Medicine) Elouise Munroe, MD as PCP - Cardiology (Cardiology) Kennith Center, RD as Dietitian (Family Medicine) [name], RN as Case Manager  OBJECTIVE:   BP 122/84   Pulse 86   Ht 5' (1.524 m)   Wt 286 lb (129.7 kg)   SpO2 99%   BMI 55.86 kg/m   General: Age-appropriate, resting uncomfortably in chair, no acute distress, obese, alert and at baseline. HEENT:  Head: Normocephalic, atraumatic. Eyes: Sclera without injection or icterus. Ears: TMs non-bulging and non-erythematous bilaterally. No erythema of external ear canal. No cerumen impaction. Nose: Non-erythematous turbinates. Mouth/Oral: Clear, no tonsillar exudate. MMM.  No oral lesions noted. Neck: Supple. No LAD. Cardiovascular: Regular rate and rhythm. Normal S1/S2. No murmurs, rubs, or gallops appreciated. 2+ radial pulses. Pulmonary: Clear bilaterally to ascultation. No increased WOB, no accessory muscle usage. No wheezes, rales, or crackles. Abdominal: Normoactive bowel sounds. No tenderness to deep or light palpation. No rebound or guarding. No HSM. Skin: Scattered flat, macular, and confluent rash with marginal erythema and central clearing (see images below, more attached to chart in media).  Excoriations visible across body. Extremities: No peripheral edema bilaterally.   Left elbow  chest  {Show previous vital signs (optional):23777}    ASSESSMENT/PLAN:   Rash Etiology of rash is unclear however, differential includes erythema multiforme versus diffuse urticarial rash.  Erythema multiforme itself has a broad array of differential diagnoses.  Patient has a few possible environmental exposures that could have triggered urticaria but patient is also on a fairly high dose  of antihistamines with her cetrizine and has not yet experienced relief of symptoms.  Systemic steroids will be Treatment, however patient is asked about steroids given past weight gain and adverse effects on the treatment.  At  this time patient is interested in pursuing referral to allergy specialist.  There is a possibility that this rash is related to patient's recent diagnosis of myasthenia gravis and further follow-up with patient's neurologist was advised (patient plans to [facility] through [facility]).  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 - Allergy referral placed - Patient will follow up with neurologist about myasthenia gravis - Continue cetrizine 10 mg 2-3 times daily  Follow up in 4 weeks or as needed if not improving.  Dimitry Mining engineer, [place]   Rash clinically appears more like erythema multiforme rather than urticaria given absence of wheals and rash is not raised. Patient was seen by PCP Dr. Gwendlyn Deutscher during medical student evaluation.  Patient declined corticosteroids, which probably would be of limited utility at this point given time course.  I was personally present and performed or re-performed the history, physical exam and medical decision making activities of this service and have verified that the service and findings are accurately documented in the student's note.  Zola Button, MD                  01/27/2022, 9:35 PM

## 2022-01-27 NOTE — Assessment & Plan Note (Addendum)
Etiology of rash is unclear however, differential includes erythema multiforme versus diffuse urticarial rash.  Erythema multiforme itself has a broad array of differential diagnoses.  Patient has a few possible environmental exposures that could have triggered urticaria but patient is also on a fairly high dose of antihistamines with her cetrizine and has not yet experienced relief of symptoms.  Systemic steroids will be Treatment, however patient is asked about steroids given past weight gain and adverse effects on the treatment.  At this time patient is interested in pursuing referral to allergy specialist.  There is a possibility that this rash is related to patient's recent diagnosis of myasthenia gravis and further follow-up with patient's neurologist was advised (patient plans to contact neurologist at [facility] through [facility]).  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 - Allergy referral placed - Patient will follow up with neurologist about myasthenia gravis - Continue cetrizine 10 mg 2-3 times daily

## 2022-01-27 NOTE — Progress Notes (Deleted)
    SUBJECTIVE:   CHIEF COMPLAINT / HPI:  No chief complaint on file.   ***  PERTINENT  PMH / PSH: Depression, anxiety, myasthenia gravis, OSA on CPAP  Patient Care Team: [name], MD as PCP - General (Family Medicine) Elouise Munroe, MD as PCP - Cardiology (Cardiology) Kennith Center, RD as Dietitian (Family Medicine) [name], RN as Case Manager   OBJECTIVE:   There were no vitals taken for this visit.  Physical Exam      12/31/2021    9:27 AM  Depression screen PHQ 2/9  Decreased Interest 0  Down, Depressed, Hopeless 0  PHQ - 2 Score 0  Altered sleeping 3  Tired, decreased energy 3  Change in appetite 3  Feeling bad or failure about yourself  0  Trouble concentrating 0  Moving slowly or fidgety/restless 0  Suicidal thoughts 0  PHQ-9 Score 9  Difficult doing work/chores Not difficult at all     {Show previous vital signs (optional):23777}  {Labs  Heme  Chem  Endocrine  Serology  Results Review (optional):23779}  ASSESSMENT/PLAN:   No problem-specific Assessment & Plan notes found for this encounter.    No follow-ups on file.   Zola Button, MD [place]

## 2022-01-27 NOTE — Patient Instructions (Addendum)
It was nice seeing you today!  We are not 100% sure what is causing your rash, but there are a few possible causes.  This could be an allergic reaction to something in your home or environment.  You are already taking a lot of Zyrtec, which should have helped by now with the allergy symptoms.  This could also be related to your myasthenia gravis.  It would be helpful to follow up with your myasthenia specialist.  Also, please pick up your Advair and start taking that soon.  We are also referring you to an allergist to further look into this problem.  If you experience shortness of breath, lip/tongue swelling, or any other concerning symptoms, please seek medical help in the ED right away.  Stay well, Frances Button, MD [place] [phone]  --  Make sure to check out at the front desk before you leave today.  Please arrive at least 15 minutes prior to your scheduled appointments.  If you had blood work today, I will send you a MyChart message or a letter if results are normal. Otherwise, I will give you a call.  If you had a referral placed, they will call you to set up an appointment. Please give [place] a call if you don't hear back in the next 2 weeks.  If you need additional refills before your next appointment, please call your pharmacy first.

## 2022-01-29 ENCOUNTER — Telehealth: Payer: Self-pay

## 2022-01-29 NOTE — Patient Outreach (Signed)
  Care Coordination   Reschedule Call  Visit Note   01/29/2022 Name: SHELBIE FRANKEN MRN: [id number] DOB: May 30, 1960  Frances Medina is a 61 y.o. year old female who sees [name], MD for primary care. I spoke with  Frances Medina by phone today.  What matters to the patients health and wellness today?  Patient waas unable to keep phone appointment      SDOH assessments and interventions completed:  No     Care Coordination Interventions:  No, not indicated   Follow up plan: Follow up call scheduled for 12/1/  1015 am    Encounter Outcome:  Pt. Visit Completed   .[name] RN, BSN, [place] [facility]   Phone: [phone]

## 2022-01-31 ENCOUNTER — Ambulatory Visit: Payer: Self-pay

## 2022-02-01 NOTE — Patient Outreach (Signed)
  Care Coordination   Follow Up Visit Note   01/31/2022 Name: Frances Medina MRN: [id number] DOB: September 11, 1960  Frances Medina is a 61 y.o. year old female who sees Frances Feil, MD for primary care. I spoke with  Frances Medina by phone today.  What matters to the patients health and wellness today?  During my conversation with Frances Medina, she mentioned that she was not experiencing any issues with her breathing but had developed a rash that covered her entire body. She expressed that the rash was causing her intense itching and burning sensations. Despite undergoing tests, doctors were still unsure about the origin of the rash, and Frances Medina was waiting for the results of her blood test and an appointment to test for allergies. In the meantime, she was taking Benedryl and Zyrtec to alleviate the symptoms. I suggested an Aveno Oatmeal bath as a possible solution for the itching, which Frances Medina found to be a helpful suggestion.    Goals Addressed               This Visit's Progress     I want to continue on my weight loss journey and monitor my breathing (pt-stated)        Care Coordination Interventions: Active listening / Reflection utilized  Emotional Support Provided Patient will self administer medications as prescribed Patient will attend all scheduled provider appointments Patient will call pharmacy for medication refills Patient will call provider office for new concerns or questions Move as tolerated and rest when needed        SDOH assessments and interventions completed:  No     Care Coordination Interventions:  Yes, provided   Follow up plan: Follow up call scheduled for 12/19  10 am    Encounter Outcome:  Pt. Visit Completed   [name] RN, BSN, [place]   Phone: [phone]

## 2022-02-01 NOTE — Patient Instructions (Signed)
Visit Information  Thank you for taking time to visit with me today. Please don't hesitate to contact me if I can be of assistance to you.   Following are the goals we discussed today:   Goals Addressed               This Visit's Progress     I want to continue on my weight loss journey and monitor my breathing (pt-stated)        Care Coordination Interventions: Active listening / Reflection utilized  Emotional Support Provided Patient will self administer medications as prescribed Patient will attend all scheduled provider appointments Patient will call pharmacy for medication refills Patient will call provider office for new concerns or questions Move as tolerated and rest when needed        Our next appointment is by telephone on 12/19 at 10 am  Please call the care guide team at [phone] if you need to cancel or reschedule your appointment.   If you are experiencing a Mental Health or [place] or need someone to talk to, please call 1-800-273-TALK (toll free, 24 hour hotline)  Patient verbalizes understanding of instructions and care plan provided today and agrees to view in [place]. Active MyChart status and patient understanding of how to access instructions and care plan via MyChart confirmed with patient.     [name] RN, BSN, [place] [facility]   Phone: [phone]

## 2022-02-06 ENCOUNTER — Telehealth: Payer: Self-pay | Admitting: Pulmonary Disease

## 2022-02-07 ENCOUNTER — Inpatient Hospital Stay: Admission: RE | Admit: 2022-02-07 | Payer: BC Managed Care – PPO | Source: Ambulatory Visit

## 2022-02-07 NOTE — Telephone Encounter (Signed)
Dr. Erin Fulling ordered pt to have a high resolution CT scan at the consultation pt had with him as he is trying to rule out any ILD involvement. The CT that pt is needing to have is the HRCT that was ordered by Dr. Erin Fulling as this will determine if pt does have ILD.  Attempted to call [place] but unable to reach. Left message for her to return call.

## 2022-02-07 NOTE — Telephone Encounter (Signed)
Called Frances Medina back and there was no answer- LMTCB.

## 2022-02-10 NOTE — Telephone Encounter (Signed)
Received a secure chat message from [place] on pt 12/8 and stated to her that pt would receive a phone call about getting the HRCT scheduled and she verbalized understanding.

## 2022-02-19 ENCOUNTER — Ambulatory Visit: Payer: Self-pay

## 2022-02-19 NOTE — Patient Instructions (Signed)
Visit Information  Thank you for taking time to visit with me today. Please don't hesitate to contact me if I can be of assistance to you.   Following are the goals we discussed today:   Goals Addressed               This Visit's Progress     I want to continue on my weight loss journey and monitor my breathing (pt-stated)        Care Coordination Interventions: Active listening / Reflection utilized  Emotional Support Provided Patient will self administer medications as prescribed Patient will attend all scheduled provider appointments Patient will call pharmacy for medication refills Patient will call provider office for new concerns or questions Move as tolerated and rest when needed Mrs. Martenson reported that her rash had cleared up for a while, so she stopped taking her medications. However, about a week ago, on Sunday, the rash came back. Since then, she has started back on her drugs and plans to continue taking them until her appointment with Dr. Posey Pronto at the allergy office on 03/14/22. Although she is currently doing fine with her breathing, she mentioned that the rash affected her breathing when it started to flare up again. I advised her to continue her medications and other treatments as prescribed and recommended. She confirmed that she understood my advice.        Our next appointment is by telephone on 04/17/22 at 10 am  Please call the care guide team at [phone] if you need to cancel or reschedule your appointment.   If you are experiencing a Mental Health or [place] or need someone to talk to, please call 1-800-273-TALK (toll free, 24 hour hotline)  Patient verbalizes understanding of instructions and care plan provided today and agrees to view in [place]. Active MyChart status and patient understanding of how to access instructions and care plan via MyChart confirmed with patient.     [name] RN, BSN, [place]  [facility]   Phone: [phone]

## 2022-02-19 NOTE — Patient Outreach (Signed)
  Care Coordination   Follow Up Visit Note   02/19/2022 Name: Frances Medina MRN: [id number] DOB: [birth year]  Frances Medina is a 61 y.o. year old female who sees [name], MD for primary care. I spoke with  Frances Medina by phone today.  What matters to the patients health and wellness today?  I still have the rash but the medication has helped with the itching.    Goals Addressed               This Visit's Progress     I want to continue on my weight loss journey and monitor my breathing (pt-stated)        Care Coordination Interventions: Active listening / Reflection utilized  Emotional Support Provided Patient will self administer medications as prescribed Patient will attend all scheduled provider appointments Patient will call pharmacy for medication refills Patient will call provider office for new concerns or questions Move as tolerated and rest when needed Frances Medina reported that her rash had cleared up for a while, so she stopped taking her medications. However, about a week ago, on Sunday, the rash came back. Since then, she has started back on her drugs and plans to continue taking them until her appointment with Dr. Posey Pronto at the allergy office on 03/14/22. Although she is currently doing fine with her breathing, she mentioned that the rash affected her breathing when it started to flare up again. I advised her to continue her medications and other treatments as prescribed and recommended. She confirmed that she understood my advice.        SDOH assessments and interventions completed:  No     Care Coordination Interventions:  Yes, provided   Follow up plan: Follow up call scheduled for 04/17/22  10 am    Encounter Outcome:  Pt. Visit Completed   [name] RN, BSN, [facility] [facility]   Phone: [phone]

## 2022-03-10 ENCOUNTER — Encounter: Payer: Self-pay | Admitting: Family Medicine

## 2022-03-10 NOTE — Progress Notes (Unsigned)
Subjective:   Frances Medina is a 62 y.o. female who presents for an Initial Medicare Annual Wellness Visit. Patient agreed to conduct part of this visit by telephone on 1/8 and part in[place] on 03/11/22. Review of Systems    ***       Objective:    There were no vitals filed for this visit. There is no height or weight on file to calculate BMI.     01/27/2022    2:25 PM 12/31/2021    9:26 AM 12/03/2021    8:31 AM 10/01/2021    8:28 AM 07/11/2021    9:11 AM 03/06/2021    8:30 AM 01/18/2021    8:30 AM  Advanced Directives  Does Patient Have a Medical Advance Directive? No No No No No No No  Would patient like information on creating a medical advance directive? No - Patient declined No - Patient declined No - Patient declined No - Patient declined  No - Patient declined No - Patient declined    Current Medications (verified) Outpatient Encounter Medications as of 03/11/2022  Medication Sig   baclofen (LIORESAL) 10 MG tablet TAKE 1 TABLET BY MOUTH AT BEDTIME AS NEEDED FOR MUSCLE SPASMS   benzonatate (TESSALON) 100 MG capsule TAKE 1 CAPSULE BY MOUTH TWICE A DAY AS NEEDED FOR COUGH   cetirizine (ZYRTEC) 10 MG tablet TAKE 1 TABLET (10 MG TOTAL) BY MOUTH DAILY. NO THERAPEUTIC SUBSTITUTION   clonazePAM (KLONOPIN) 0.5 MG tablet TAKE 1 TABLET BY MOUTH TWICE A DAY AS NEEDED FOR ANXIETY   Cyanocobalamin (VITAMIN B12 PO) Take 1 tablet by mouth daily.   fluticasone (FLONASE) 50 MCG/ACT nasal spray Place 2 sprays into both nostrils daily.   fluticasone-salmeterol (ADVAIR HFA) 115-21 MCG/ACT inhaler Inhale 2 puffs into the lungs every 12 (twelve) hours as needed.   gabapentin (NEURONTIN) 300 MG capsule Take 1 capsule (300 mg total) by mouth 2 (two) times daily.   montelukast (SINGULAIR) 10 MG tablet TAKE 1 TABLET BY MOUTH EVERYDAY AT BEDTIME   mycophenolate (CELLCEPT) 500 MG tablet Take 1,000 mg by mouth 2 (two) times daily.   omeprazole (PRILOSEC) 20 MG capsule TAKE ONE TABLET DAILY AS NEEDED    Semaglutide-Weight Management 2.4 MG/0.75ML SOAJ Inject 2.4 mg as directed once a week.   sertraline (ZOLOFT) 50 MG tablet TAKE 3 TABLETS BY MOUTH EVERY DAY   VITAMIN D, CHOLECALCIFEROL, PO Take 1 tablet by mouth daily. otc   acetaminophen (TYLENOL) 500 MG tablet Take 500 mg by mouth in the morning and at bedtime. (Patient not taking: Reported on 03/10/2022)   albuterol (PROVENTIL) (2.5 MG/3ML) 0.083% nebulizer solution USE 1 VIAL IN NEBULIZER EVERY 6 HOURS AS NEEDED FOR SHORTNESS OF BREATH AND WHEEZING (Patient not taking: Reported on 03/10/2022)   albuterol (VENTOLIN HFA) 108 (90 Base) MCG/ACT inhaler INHALE 2 PUFFS INTO THE LUNGS EVERY 6 HOURS AS NEEDED FOR WHEEZE OR SHORTNESS OF BREATH (Patient not taking: Reported on 03/10/2022)   APPLE CIDER VINEGAR PO Take 1 capsule by mouth daily.   Ascorbic Acid (VITAMIN C PO) Take 1,000 mg by mouth daily.   Menthol 10 MG LOZG Take 1 lozenge by mouth daily as needed (cough).   nystatin (MYCOSTATIN/NYSTOP) powder Apply 1 application. topically 3 (three) times daily. (Patient not taking: Reported on 03/10/2022)   No facility-administered encounter medications on file as of 03/11/2022.    Allergies (verified) Ciprofloxacin, Naproxen, Shrimp [shellfish allergy], Dicyclomine, Methocarbamol, Penicillins, Pineapple, and Morphine and related   History: Past Medical History:  Diagnosis Date   Acute respiratory failure (HCC)    Acute respiratory failure with hypoxemia (HCC)    Aspiration into airway    Back pain 07/31/2017   Benign neoplasm of rectum    CAP (community acquired pneumonia) 08/05/2015   Depression with anxiety    Diverticulosis    Dysphagia 03/2015    EGD, Dr Carlean Purl. mild antral gastritis, ? due to Ibuprofen.  no stricture but empirically maloney dilated esophagus.    Dysphagia, neurologic    E. coli UTI 04/07/2015   Elevated LDH 05/06/2015   Encounter for routine gynecological examination 10/19/2015   Endotracheally intubated    Enteritis due to  Clostridium difficile    Esophageal dysmotility 11/08/2015   Fibroid uterus    size of a dime   Gastrostomy infection ([place]) 11/08/2015   Gastrostomy infection ([place]) 11/08/2015   GERD (gastroesophageal reflux disease)    hiatal hernia   Hypertension    "went away when I stopped smoking"   Knee injury    Left hip pain 02/18/2017   Migraine    "maybe couple times/month" (03/20/2015)   Mild intermittent asthma 06/22/2017   Myasthenia gravis ([place]) 2017   Myasthenia gravis with acute exacerbation ([place]) 05/15/2015   Osteoarthritis of left knee 12/02/2013   Osteoarthritis of right knee 08/30/2013   Pancreatitis 07/25/2017   Protein-calorie malnutrition, severe ([place]) 08/07/2015   Seasonal allergies    takes Zytrec   Tracheostomy status ([place])    Tubular adenoma of colon    Tumors    "in my stomach"   Umbilical hernia    watching , no plans for surgery at present   Urine incontinence 10/19/2015   VAP (ventilator-associated pneumonia) [facility])    Past Surgical History:  Procedure Laterality Date   BIOPSY  07/05/2019   Procedure: BIOPSY;  Surgeon: Ladene Artist, MD;  Location: [facility] ENDOSCOPY;  Service: Endoscopy;;   [place]; 1989   COLONOSCOPY WITH PROPOFOL N/A 09/19/2013   Procedure: COLONOSCOPY WITH PROPOFOL;  Surgeon: Ladene Artist, MD;  Location: [facility];  Service: Endoscopy;  Laterality: N/A;   COLONOSCOPY WITH PROPOFOL N/A 07/05/2019   Procedure: COLONOSCOPY WITH PROPOFOL;  Surgeon: Ladene Artist, MD;  Location: [facility];  Service: Endoscopy;  Laterality: N/A;   DILATION AND CURETTAGE OF UTERUS     ESOPHAGOGASTRODUODENOSCOPY N/A 08/10/2015   Procedure: ESOPHAGOGASTRODUODENOSCOPY (EGD);  Surgeon: Gatha Mayer, MD;  Location: [facility] ENDOSCOPY;  Service: Endoscopy;  Laterality: N/A;   ESOPHAGOGASTRODUODENOSCOPY (EGD) WITH PROPOFOL N/A 03/21/2015   Procedure: ESOPHAGOGASTRODUODENOSCOPY (EGD) WITH PROPOFOL;  Surgeon: Gatha Mayer, MD;  Location: [place];  Service: Endoscopy;   Laterality: N/A;   ESOPHAGOGASTRODUODENOSCOPY (EGD) WITH PROPOFOL N/A 07/05/2019   Procedure: ESOPHAGOGASTRODUODENOSCOPY (EGD) WITH PROPOFOL;  Surgeon: Ladene Artist, MD;  Location: [facility];  Service: Endoscopy;  Laterality: N/A;   PARTIAL KNEE ARTHROPLASTY Right 08/30/2013   Procedure: RIGHT UNICOMPARTMENTAL KNEE;  Surgeon: Johnny Bridge, MD;  Location: [place];  Service: Orthopedics;  Laterality: Right;   PARTIAL KNEE ARTHROPLASTY Left 12/02/2013   Procedure: LEFT KNEE UNI ARTHROPLASTY;  Surgeon: Johnny Bridge, MD;  Location: [place];  Service: Orthopedics;  Laterality: Left;   PEG PLACEMENT N/A 08/10/2015   Procedure: PERCUTANEOUS ENDOSCOPIC GASTROSTOMY (PEG) PLACEMENT;  Surgeon: Gatha Mayer, MD;  Location: [place];  Service: Endoscopy;  Laterality: N/A;   POLYPECTOMY  07/05/2019   Procedure: POLYPECTOMY;  Surgeon: Ladene Artist, MD;  Location: [facility];  Service: Endoscopy;;  TUBAL LIGATION  1989   VAGINAL HYSTERECTOMY  1990's?   "apparently took out one of my ovaries at the time too cause one's missing"   Family History  Problem Relation Age of Onset   Heart disease Mother 22   Hypertension Mother    Stroke Father    Hypertension Father    Other Sister        Esophageal strictures   Anemia Sister    COPD Brother    Leukemia Sister    Leukemia Sister    Colon cancer Neg Hx    Social History   Socioeconomic History   Marital status: Married    Spouse name: Not on file   Number of children: 3   Years of education: Not on file   Highest education level: Not on file  Occupational History   Occupation: disabled  Tobacco Use   Smoking status: Former    Packs/day: 1.50    Years: 38.00    Total pack years: 57.00    Types: Cigarettes    Start date: 03/03/1974    Quit date: 05/14/2012    Years since quitting: 9.8   Smokeless tobacco: Never   Tobacco comments:    denies thoughts of restarting  Vaping Use   Vaping Use: Former  Substance  and Sexual Activity   Alcohol use: No    Alcohol/week: 0.0 standard drinks of alcohol   Drug use: No   Sexual activity: Not Currently    Birth control/protection: Surgical  Other Topics Concern   Not on file  Social History Narrative   Work or School: [place] Situation: lives with husband and daughters      Spiritual Beliefs: Christian      Lifestyle: no regular exercise; trying to eat healthy      [facility] (09/11/16):   Originally from [facility]. Has always lived in [place]. No pets currently. Does have carpet in her home in her bedroom. No bird exposure. Previously had mold under her kitchen sink. No indoor plants. Previously was working in [employer].          Social Determinants of Health   Financial Resource Strain: Not on file  Food Insecurity: No Food Insecurity (03/10/2022)   Hunger Vital Sign    Worried About Running Out of Food in the Last Year: Never true    Ran Out of Food in the Last Year: Never true  Transportation Needs: No Transportation Needs (03/10/2022)   PRAPARE - Hydrologist (Medical): No    Lack of Transportation (Non-Medical): No  Physical Activity: Insufficiently Active (03/10/2022)   Exercise Vital Sign    Days of Exercise per Week: 2 days    Minutes of Exercise per Session: 60 min  Stress: Not on file  Social Connections: Not on file    Tobacco Counseling Counseling given: Yes Tobacco comments: denies thoughts of restarting   Clinical Intake:                 Diabetic?***         Activities of Daily Living     No data to display          Patient Care Team: [name], MD as PCP - General (Family Medicine) Elouise Munroe, MD as PCP - Cardiology (Cardiology) Kennith Center, RD as Dietitian (Family Medicine) [name], RN as Case Manager  Indicate any recent [place]  you may have received from other than [facility] providers in the  past year (date may be approximate).     Assessment:   This is a routine wellness examination for [place].  Hearing/Vision screen No results found.  Dietary issues and exercise activities discussed: Current Exercise Habits: Structured exercise class, Time (Minutes): 60, Frequency (Times/Week): 2, Weekly Exercise (Minutes/Week): 120, Intensity: Moderate (Walking exercise in the store twice weekly)   Goals Addressed             This Visit's Progress    Weight (lb) < 200 lb (90.7 kg)       I want to continue to lose weigh. My weight fluctuate despite being on [place]. I want to weight between 150-200 lbs. Monitor over the next 6 months.       Depression Screen    03/10/2022   11:12 AM 01/27/2022    2:25 PM 12/31/2021    9:27 AM 12/03/2021    8:30 AM 10/01/2021    8:28 AM 07/11/2021    9:28 AM 04/16/2021    9:29 AM  PHQ 2/9 Scores  PHQ - 2 Score 0 0 0 0  0 1  PHQ- 9 Score '6 7 9 6  7 4  '$ Exception Documentation     Patient refusal      Fall Risk    09/26/2020   11:19 AM 06/18/2020   12:16 PM 03/30/2020    9:27 AM 07/19/2019    9:24 AM 06/17/2019   10:29 AM  Fall Risk   Falls in the past year? 0 0 0 0 0  Number falls in past yr:    0 0  Injury with Fall?     0  Follow up    Falls evaluation completed     FALL RISK PREVENTION PERTAINING TO THE HOME:  Any stairs in or around the home? Yes  If so, are there any without handrails? Yes  Home free of loose throw rugs in walkways, pet beds, electrical cords, etc? Yes  Adequate lighting in your home to reduce risk of falls? Yes   ASSISTIVE DEVICES UTILIZED TO PREVENT FALLS:  Life alert? No  Use of a cane, walker or w/c? Yes  Grab bars in the bathroom? No  Shower chair or bench in shower? Yes  Elevated toilet seat or a handicapped toilet? No   TIMED UP AND GO:  Was the test performed? {YES/NO:21197}.  Length of time to ambulate 10 feet: *** sec.   {Appearance of ZYSA:6301601}  Cognitive Function:         Immunizations Immunization History  Administered Date(s) Administered   Hepatitis B, adult 09/12/2019   Influenza,inj,Quad PF,6+ Mos 03/22/2015, 02/25/2018, 11/29/2018, 12/19/2019, 01/18/2021, 12/03/2021   PFIZER(Purple Top)SARS-COV-2 Vaccination 12/19/2019, 01/16/2020   PNEUMOCOCCAL CONJUGATE-20 01/18/2021   Pfizer Covid-19 Vaccine Bivalent Booster 24yr & up 01/18/2021   Pneumococcal Polysaccharide-23 10/27/2016   Tdap 07/21/2013   Zoster Recombinat (Shingrix) 03/01/2021    TDAP status: Up to date  Flu Vaccine status: Up to date  Pneumococcal vaccine status: Up to date  {Covid-19 vaccine status:2101808}  Qualifies for Shingles Vaccine? {YES/NO:21197}  Zostavax completed {YES/NO:21197}  {Shingrix Completed?:2101804}  Screening Tests Health Maintenance  Topic Date Due   Medicare Annual Wellness (AWV)  Never done   PAP SMEAR-Modifier  10/18/2020   Lung Cancer Screening  05/03/2021   COVID-19 Vaccine (4 - 2023-24 season) 11/01/2021   MAMMOGRAM  07/31/2022   DTaP/Tdap/Td (2 - Td or Tdap) 07/22/2023   COLONOSCOPY (Pts  45-71yr Insurance coverage will need to be confirmed)  07/04/2029   INFLUENZA VACCINE  Completed   Hepatitis C Screening  Completed   HIV Screening  Completed   Zoster Vaccines- Shingrix  Completed   HPV VACCINES  Aged Out    Health Maintenance  Health Maintenance Due  Topic Date Due   Medicare Annual Wellness (AWV)  Never done   PAP SMEAR-Modifier  10/18/2020   Lung Cancer Screening  05/03/2021   COVID-19 Vaccine (4 - 2023-24 season) 11/01/2021    Colorectal cancer screening: Type of screening: Colonoscopy. Completed 07/05/19. Repeat every 7 years  Mammogram status: Completed 07/2021. Repeat every year   Lung Cancer Screening: (Low Dose CT Chest recommended if Age 61-80years, 30 pack-year currently smoking OR have quit w/in 15years.) {DOES NOT does:27190::"does not"} qualify.   Lung Cancer Screening Referral: ***  Additional  Screening:  Hepatitis C Screening: does qualify; Completed 09/2019  Vision Screening: Recommended annual ophthalmology exams for early detection of glaucoma and other disorders of the eye. Is the patient up to date with their annual eye exam?  {YES/NO:21197} Who is the provider or what is the name of the office in which the patient attends annual eye exams? *** If pt is not established with a provider, would they like to be referred to a provider to establish care? {YES/NO:21197}.   Dental Screening: Recommended annual dental exams for proper oral hygiene  Community Resource Referral / Chronic Care Management: CRR required this visit?  {YES/NO:21197}  CCM required this visit?  {YES/NO:21197}     Plan:     I have personally reviewed and noted the following in the patient's chart:   Medical and social history Use of alcohol, tobacco or illicit drugs  Current medications and supplements including opioid prescriptions. {Opioid Prescriptions:[phone]} Functional ability and status Nutritional status Physical activity Advanced directives List of other physicians Hospitalizations, surgeries, and ER visits in previous 12 months Vitals Screenings to include cognitive, depression, and falls Referrals and appointments  In addition, I have reviewed and discussed with patient certain preventive protocols, quality metrics, and best practice recommendations. A written personalized care plan for preventive services as well as general preventive health recommendations were provided to patient.     KAndrena Mews MD   03/10/2022

## 2022-03-10 NOTE — Patient Instructions (Addendum)
Lung Cancer Screening A lung cancer screening is a test that checks for lung cancer when there are no symptoms or history of that disease. The screening is done to look for lung cancer in its very early stages. Finding cancer early improves the chances of successful treatment. It may save your life. Who should have a screening? You should be screened for lung cancer if all of these apply: You currently smoke, or you have quit smoking within the past 15 years. You are between the ages of 50 and 80 years old. Screening may be recommended up to age 80 depending on your overall health and other factors. You have a smoking history of 1 pack of cigarettes a day for 20 years or 2 packs a day for 10 years. How is screening done?  The recommended screening test is a low-dose computed tomography (LDCT) scan. This scan takes detailed images of the lungs. This allows a health care provider to look for abnormal cells. If you are at risk for lung cancer, it is recommended that you get screened once a year. Talk to your health care provider about the risks, benefits, and limitations of screening. What are the benefits of screening? Screening can find lung cancer early, before symptoms start and before it has spread outside of the lungs. The chances of curing lung cancer are greater if the cancer is diagnosed early. What are the risks of screening? The screening may show lung cancer when no cancer is present. Talk with your health care provider about what your results mean. In some cases, your health care provider may do more testing to confirm the results. The screening may not find lung cancer when it is present. You will be exposed to radiation from repeated LDCT tests, which can cause cancer in otherwise healthy people. How can I lower my risk of lung cancer? Make these lifestyle changes to lower your risk of developing lung cancer: Do not use any products that contain nicotine or tobacco. These products  include cigarettes, chewing tobacco, and vaping devices, such as e-cigarettes. If you need help quitting, ask your health care provider. Avoid secondhand smoke. Avoid exposure to radiation. Avoid exposure to radon gas. Have your home checked for radon regularly. Avoid things that cause cancer (carcinogens). Avoid living or working in places with high air pollution or diesel exhaust. Questions to ask your health care provider Am I eligible for lung cancer screening? Does my health insurance cover the cost of lung cancer screening? What happens if the lung cancer screening shows something of concern? How soon will I have results from my lung cancer screening? Is there anything that I need to do to prepare for my lung cancer screening? What happens if I decide not to have lung cancer screening? Where to find more information Ask your health care provider about the risks and benefits of screening. More information and resources are available from these organizations: American Cancer Society (ACS): www.cancer.org American Lung Association: www.lung.org National Cancer Institute: www.cancer.gov Contact a health care provider if: You start to show symptoms of lung cancer, including: A cough that will not go away. High-pitched whistling sounds when you breathe, most often when you breathe out (wheezing). Chest pain. Coughing up blood. Shortness of breath. Weight loss that cannot be explained. Constant tiredness (fatigue). Hoarse voice. Summary Lung cancer screening may find lung cancer before symptoms appear. Finding cancer early improves the chances of successful treatment. It may save your life. The recommended screening test is a low-dose   computed tomography (LDCT) scan that looks for abnormal cells in the lungs. If you are at risk for lung cancer, it is recommended that you get screened once a year. You can make lifestyle changes to lower your risk of lung cancer. Ask your health care  provider about the risks and benefits of screening. This information is not intended to replace advice given to you by your health care provider. Make sure you discuss any questions you have with your health care provider. Document Revised: 08/08/2020 Document Reviewed: 08/08/2020 Elsevier Patient Education  2023 Elsevier Inc.  

## 2022-03-11 ENCOUNTER — Encounter: Payer: Self-pay | Admitting: Family Medicine

## 2022-03-11 ENCOUNTER — Other Ambulatory Visit (HOSPITAL_COMMUNITY)
Admission: RE | Admit: 2022-03-11 | Discharge: 2022-03-11 | Disposition: A | Discharge status: Home / Self Care | Payer: BC Managed Care – PPO | Source: Ambulatory Visit | Attending: Family Medicine | Admitting: Family Medicine

## 2022-03-11 ENCOUNTER — Ambulatory Visit (INDEPENDENT_AMBULATORY_CARE_PROVIDER_SITE_OTHER): Payer: BC Managed Care – PPO | Admitting: Family Medicine

## 2022-03-11 VITALS — BP 123/54 | HR 76 | Ht 60.0 in | Wt 284.2 lb

## 2022-03-11 DIAGNOSIS — Z124 Encounter for screening for malignant neoplasm of cervix: Secondary | ICD-10-CM | POA: Diagnosis not present

## 2022-03-11 DIAGNOSIS — Z01419 Encounter for gynecological examination (general) (routine) without abnormal findings: Secondary | ICD-10-CM | POA: Diagnosis not present

## 2022-03-11 DIAGNOSIS — R7309 Other abnormal glucose: Secondary | ICD-10-CM | POA: Diagnosis not present

## 2022-03-11 DIAGNOSIS — Z0000 Encounter for general adult medical examination without abnormal findings: Secondary | ICD-10-CM | POA: Diagnosis not present

## 2022-03-11 DIAGNOSIS — Z23 Encounter for immunization: Secondary | ICD-10-CM

## 2022-03-11 DIAGNOSIS — Z1151 Encounter for screening for human papillomavirus (HPV): Secondary | ICD-10-CM | POA: Diagnosis not present

## 2022-03-11 NOTE — Addendum Note (Signed)
Addended by: Andrena Mews T on: 03/11/2022 11:59 AM   Modules accepted: Level of Service

## 2022-03-12 ENCOUNTER — Encounter: Payer: Self-pay | Admitting: Family Medicine

## 2022-03-12 LAB — HEMOGLOBIN A1C
Est. average glucose Bld gHb Est-mCnc: 108 mg/dL
Hgb A1c MFr Bld: 5.4 % (ref 4.8–5.6)

## 2022-03-13 ENCOUNTER — Telehealth: Payer: Self-pay | Admitting: Family Medicine

## 2022-03-13 LAB — CYTOLOGY - PAP
Comment: NEGATIVE
Diagnosis: NEGATIVE
High risk HPV: NEGATIVE

## 2022-03-13 NOTE — Telephone Encounter (Signed)
A1C result and PAP result discussed with her. No further questions.

## 2022-03-14 ENCOUNTER — Encounter: Payer: Self-pay | Admitting: Internal Medicine

## 2022-03-14 ENCOUNTER — Ambulatory Visit (INDEPENDENT_AMBULATORY_CARE_PROVIDER_SITE_OTHER): Payer: BC Managed Care – PPO | Admitting: Internal Medicine

## 2022-03-14 ENCOUNTER — Other Ambulatory Visit: Payer: Self-pay

## 2022-03-14 VITALS — BP 138/76 | HR 78 | Temp 97.7°F | Resp 20 | Ht 60.0 in | Wt 284.1 lb

## 2022-03-14 DIAGNOSIS — T360X5A Adverse effect of penicillins, initial encounter: Secondary | ICD-10-CM

## 2022-03-14 DIAGNOSIS — L501 Idiopathic urticaria: Secondary | ICD-10-CM

## 2022-03-14 DIAGNOSIS — T7802XA Anaphylactic reaction due to shellfish (crustaceans), initial encounter: Secondary | ICD-10-CM

## 2022-03-14 DIAGNOSIS — L500 Allergic urticaria: Secondary | ICD-10-CM | POA: Diagnosis not present

## 2022-03-14 DIAGNOSIS — K219 Gastro-esophageal reflux disease without esophagitis: Secondary | ICD-10-CM | POA: Diagnosis not present

## 2022-03-14 DIAGNOSIS — Z889 Allergy status to unspecified drugs, medicaments and biological substances status: Secondary | ICD-10-CM | POA: Diagnosis not present

## 2022-03-14 DIAGNOSIS — T50905A Adverse effect of unspecified drugs, medicaments and biological substances, initial encounter: Secondary | ICD-10-CM

## 2022-03-14 DIAGNOSIS — T781XXA Other adverse food reactions, not elsewhere classified, initial encounter: Secondary | ICD-10-CM | POA: Diagnosis not present

## 2022-03-14 DIAGNOSIS — J3089 Other allergic rhinitis: Secondary | ICD-10-CM | POA: Diagnosis not present

## 2022-03-14 MED ORDER — CETIRIZINE HCL 10 MG PO TABS: 10.0000 mg | ORAL_TABLET | Freq: Two times a day (BID) | ORAL | 5 refills | Status: DC | PRN

## 2022-03-14 MED ORDER — AMOXICILLIN 250 MG/5ML PO SUSR: 500.0000 mg | Freq: Every day | ORAL | 0 refills | Status: AC

## 2022-03-14 MED ORDER — FLUTICASONE PROPIONATE 50 MCG/ACT NA SUSP: 2.0000 | Freq: Every day | NASAL | 6 refills | Status: DC

## 2022-03-14 MED ORDER — FAMOTIDINE 20 MG PO TABS: 20.0000 mg | ORAL_TABLET | Freq: Two times a day (BID) | ORAL | 5 refills | Status: DC | PRN

## 2022-03-14 NOTE — Progress Notes (Signed)
NEW PATIENT  Date of Service/Encounter:  03/14/22  Consult requested by: [name], MD   Subjective:   Frances Medina (DOB: Feb 27, 1961) is a 62 y.o. female who presents to the clinic on 03/14/2022 with a chief complaint of Allergic Rhinitis , Urticaria, Rash, Wheezing, and Breathing Problem .    History obtained from: chart review and patient.  Rash: Reports onset of a rash that was like hives around December and it went on for weeks.  The hives were occurring almost daily and last occurred was earlier this month in January.  Rash was itchy, raised and red.  It did burn when she would scratch it but no pain or scarring.  She talked to Neurology as she has Myasthenia and they told her this was not related to it.  Denies any new medications.  She did note mold in her car that got on her clothes around the same time.  She has been taking Zyrtec PRN which does seem to help.  Last use was 3 days ago.    01/27/2022: seen by Dr. Nancy Fetter for nonspecific rash- macular confluent with marginal erythema and central clearing, lots of excoriations. Thought to be possibly urticarial vs erythema multiforme.  Tried high dose anti histamines.  Referred to Allergy.  Informed to continue Zyrtec '10mg'$  2-3 times a day.   Concern for Food Allergy: Pineapple: itching tongue; occurred less than 1 year ago, fresh pineapple bothers her Avocado: itching tongue; occurred less than 1 year Shellfish: hives, SOB; about 10 years, no Epipen Avoids all of these foods.   Rhinitis:  Started around age 70s. Symptoms include: nasal congestion, rhinorrhea, post nasal drainage, watery eyes, and itchy eyes  Occurs year-round Potential triggers: noted mold in car  Treatments tried:  Flonase Zyrtec PRN; last use was over 3 days Singulair daily  Previous allergy testing: no History of sinus surgery: no Nonallergic triggers: strong odor, smoke   GERD She does have reflux and heartburn but it is controlled by  omeprazole '20mg'$  daily. She does sometimes forget to take this and has worse symptoms.   History of Penicillin Allergy: Reports having hives with penicillin over 10 years ago.  Has avoided it since then. She might have gotten amoxicillin in 2019 per chart review but she does not recall this.   Asthma: Of note, she is followed by [facility] for mild intermittent reactive airway disease and restrictive lung disease.  Reports history of asthma. Plan for HRCT.  Started on Advair 115-21 2 puffs BID and PRN Albuterol.  Also has OSA on CPAP and seasonal allergies.   In 2017, she reports being in ICU and had a trach and was diagnosed with myasthenia gravis. On Cellcept.   Past Medical History: Past Medical History:  Diagnosis Date   Acute respiratory failure ([place])    Acute respiratory failure with hypoxemia (H[facility])    Aspiration into airway    Back pain 07/31/2017   Benign neoplasm of rectum    CAP (community acquired pneumonia) 08/05/2015   Depression with anxiety    Diverticulosis    Dysphagia 03/2015    EGD, Dr Carlean Purl. mild antral gastritis, ? due to Ibuprofen.  no stricture but empirically maloney dilated esophagus.    Dysphagia, neurologic    E. coli UTI 04/07/2015   Elevated LDH 05/06/2015   Encounter for routine gynecological examination 10/19/2015   Endotracheally intubated    Enteritis due to Clostridium difficile    Esophageal dysmotility 11/08/2015   Fibroid uterus  size of a dime   Gastrostomy infection ([place]) 11/08/2015   Gastrostomy infection ([place]) 11/08/2015   GERD (gastroesophageal reflux disease)    hiatal hernia   Hypertension    "went away when I stopped smoking"   Knee injury    Left hip pain 02/18/2017   Migraine    "maybe couple times/month" (03/20/2015)   Mild intermittent asthma 06/22/2017   Myasthenia gravis ([place]) 2017   Myasthenia gravis with acute exacerbation ([place]) 05/15/2015   Osteoarthritis of left knee 12/02/2013   Osteoarthritis of right knee 08/30/2013   Pancreatitis  07/25/2017   Protein-calorie malnutrition, severe ([place]) 08/07/2015   Seasonal allergies    takes Zytrec   Tracheostomy status ([place])    Tubular adenoma of colon    Tumors    "in my stomach"   Umbilical hernia    watching , no plans for surgery at present   Urine incontinence 10/19/2015   VAP (ventilator-associated pneumonia) [facility])     Past Surgical History: Past Surgical History:  Procedure Laterality Date   BIOPSY  07/05/2019   Procedure: BIOPSY;  Surgeon: Ladene Artist, MD;  Location: [facility] END[facility]COPY;  Service: Endoscopy;;   [place]; 1989   COLONOSCOPY WITH PROPOFOL N/A 09/19/2013   Procedure: COLONOSCOPY WITH PROPOFOL;  Surgeon: Ladene Artist, MD;  Location: [facility];  Service: Endoscopy;  Laterality: N/A;   COLONOSCOPY WITH PROPOFOL N/A 07/05/2019   Procedure: COLONOSCOPY WITH PROPOFOL;  Surgeon: Ladene Artist, MD;  Location: [facility];  Service: Endoscopy;  Laterality: N/A;   DILATION AND CURETTAGE OF UTERUS     ESOPHAGOGASTRODUODENOSCOPY N/A 08/10/2015   Procedure: ESOPHAGOGASTRODUODENOSCOPY (EGD);  Surgeon: Gatha Mayer, MD;  Location: [facility] ENDOSCOPY;  Service: Endoscopy;  Laterality: N/A;   ESOPHAGOGASTRODUODENOSCOPY (EGD) WITH PROPOFOL N/A 03/21/2015   Procedure: ESOPHAGOGASTRODUODENOSCOPY (EGD) WITH PROPOFOL;  Surgeon: Gatha Mayer, MD;  Location: [place];  Service: Endoscopy;  Laterality: N/A;   ESOPHAGOGASTRODUODENOSCOPY (EGD) WITH PROPOFOL N/A 07/05/2019   Procedure: ESOPHAGOGASTRODUODENOSCOPY (EGD) WITH PROPOFOL;  Surgeon: Ladene Artist, MD;  Location: [facility];  Service: Endoscopy;  Laterality: N/A;   PARTIAL KNEE ARTHROPLASTY Right 08/30/2013   Procedure: RIGHT UNICOMPARTMENTAL KNEE;  Surgeon: Johnny Bridge, MD;  Location: [place];  Service: Orthopedics;  Laterality: Right;   PARTIAL KNEE ARTHROPLASTY Left 12/02/2013   Procedure: LEFT KNEE UNI ARTHROPLASTY;  Surgeon: Johnny Bridge, MD;  Location: [place];  Service:  Orthopedics;  Laterality: Left;   PEG PLACEMENT N/A 08/10/2015   Procedure: PERCUTANEOUS ENDOSCOPIC GASTROSTOMY (PEG) PLACEMENT;  Surgeon: Gatha Mayer, MD;  Location: [place];  Service: Endoscopy;  Laterality: N/A;   POLYPECTOMY  07/05/2019   Procedure: POLYPECTOMY;  Surgeon: Ladene Artist, MD;  Location: [facility];  Service: Endoscopy;;   [place]  1990's?   "apparently took out one of my ovaries at the time too cause one's missing"    Family History: Family History  Problem Relation Age of Onset   Heart disease Mother 71   Hypertension Mother    Stroke Father    Hypertension Father    Atopy Sister    Allergic rhinitis Sister    Other Sister        Esophageal strictures   Allergic rhinitis Sister    Anemia Sister    Leukemia Sister    Leukemia Sister    COPD Brother    Colon cancer Neg Hx     Social History:  Lives in a 27 year house Flooring in bedroom: carpet Pets: none Tobacco use/exposure: quit 10 years ago; smoked for 40 years, highest 1.5ppd Job: housewife  Medication List:  Allergies as of 03/14/2022       Reactions   Ciprofloxacin Shortness Of Breath, Rash   Naproxen Shortness Of Breath   Shrimp [shellfish Allergy] Hives, Shortness Of Breath   ER required   Dicyclomine Hives   Methocarbamol Hives   Penicillins Hives   Has patient had a PCN reaction causing immediate rash, facial/tongue/throat swelling, SOB or lightheadedness with hypotension: Yes Has patient had a PCN reaction causing severe rash involving mucus membranes or skin necrosis: No Has patient had a PCN reaction that required hospitalization No Has patient had a PCN reaction occurring within the last 10 years: No If all of the above answers are "NO", then may proceed with Cephalosporin use.   Pineapple Itching   Itchy lips and tongue   Morphine And Related Other (See Comments)   Severe headache        Medication List        Accurate as of  March 14, 2022  1:07 PM. If you have any questions, ask your nurse or doctor.          acetaminophen 500 MG tablet Commonly known as: TYLENOL Take 500 mg by mouth in the morning and at bedtime.   albuterol (2.5 MG/3ML) 0.083% nebulizer solution Commonly known as: PROVENTIL USE 1 VIAL IN NEBULIZER EVERY 6 HOURS AS NEEDED FOR SHORTNESS OF BREATH AND WHEEZING   albuterol 108 (90 Base) MCG/ACT inhaler Commonly known as: VENTOLIN HFA INHALE 2 PUFFS INTO THE LUNGS EVERY 6 HOURS AS NEEDED FOR WHEEZE OR SHORTNESS OF BREATH   APPLE CIDER VINEGAR PO Take 1 capsule by mouth daily.   baclofen 10 MG tablet Commonly known as: LIORESAL TAKE 1 TABLET BY MOUTH AT BEDTIME AS NEEDED FOR MUSCLE SPASMS   benzonatate 100 MG capsule Commonly known as: TESSALON TAKE 1 CAPSULE BY MOUTH TWICE A DAY AS NEEDED FOR COUGH   cetirizine 10 MG tablet Commonly known as: ZYRTEC TAKE 1 TABLET (10 MG TOTAL) BY MOUTH DAILY. NO THERAPEUTIC SUBSTITUTION   clonazePAM 0.5 MG tablet Commonly known as: KLONOPIN TAKE 1 TABLET BY MOUTH TWICE A DAY AS NEEDED FOR ANXIETY   fluticasone 50 MCG/ACT nasal spray Commonly known as: FLONASE Place 2 sprays into both nostrils daily.   fluticasone-salmeterol 115-21 MCG/ACT inhaler Commonly known as: Advair HFA Inhale 2 puffs into the lungs every 12 (twelve) hours as needed.   gabapentin 300 MG capsule Commonly known as: NEURONTIN Take 1 capsule (300 mg total) by mouth 2 (two) times daily.   Menthol 10 MG Lozg Take 1 lozenge by mouth daily as needed (cough).   montelukast 10 MG tablet Commonly known as: SINGULAIR TAKE 1 TABLET BY MOUTH EVERYDAY AT BEDTIME   mycophenolate 500 MG tablet Commonly known as: CELLCEPT Take 1,000 mg by mouth 2 (two) times daily.   mycophenolate 500 MG tablet Commonly known as: CELLCEPT Take 500 mg by mouth 2 (two) times daily.   nystatin powder Commonly known as: MYCOSTATIN/NYSTOP Apply 1 application. topically 3 (three) times  daily.   omeprazole 20 MG capsule Commonly known as: PRILOSEC TAKE ONE TABLET DAILY AS NEEDED   Semaglutide-Weight Management 2.4 MG/0.75ML Soaj Inject 2.4 mg as directed once a week.   sertraline 50 MG tablet Commonly known as: ZOLOFT TAKE 3 TABLETS BY MOUTH EVERY DAY   VITAMIN B12 PO Take  1 tablet by mouth daily.   VITAMIN C PO Take 1,000 mg by mouth daily.   VITAMIN D (CHOLECALCIFEROL) PO Take 1 tablet by mouth daily. otc         REVIEW OF SYSTEMS: Pertinent positives and negatives discussed in HPI.   Objective:   Physical Exam: BP 138/76   Pulse 78   Temp 97.7 F (36.5 C) (Temporal)   Resp 20   Ht 5' (1.524 m)   Wt 284 lb 1.6 oz (128.9 kg)   SpO2 97%   BMI 55.48 kg/m  Body mass index is 55.48 kg/m. GEN: alert, well developed HEENT: clear conjunctiva, TM grey and translucent, nose with + inferior turbinate hypertrophy, pink nasal mucosa, slight clear rhinorrhea, no cobblestoning HEART: regular rate and rhythm, no murmur LUNGS: clear to auscultation bilaterally, no coughing, unlabored respiration ABDOMEN: soft, non distended  SKIN: no rashes or lesions  Reviewed:   01/27/2022: seen by Dr. Nancy Fetter for nonspecific rash- macular confluent with marginal erythema and central clearing, lots of excoriations. Thought to be possibly urticarial vs erythema multiforme.  Tried high dose anti histamines.  Referred to Allergy.  Informed to continue Zyrtec '10mg'$  2-3 times a day.   01/20/2022: seen by Pulm for mild intermittent reactive airway disease and restrictive lung disease.  Reports history of asthma. Plan for HRCT.  Started on Advair 115-21 2 puffs BID and PRN Albuterol.  Also has OSA on CPAP and seasonal allergies.   10/01/2021: seen by Dr. McDiarmid for headaches, cough, rhinorrhea, congestion over the past month. Using Flonase and Zyrtec . Concern for Sinuitis.  Informed to use INCS daily rather than PRN.  No concern for bacterial infection.  Independent  interpretation of spirometry from 02/07/2022 shows possible restriction with low FVC, no obstruction with normal ratio.   Skin Testing:  Skin prick testing was placed, which includes aeroallergens/foods, histamine control, and saline control.  Verbal consent was obtained prior to placing test.  Patient tolerated procedure well.  Allergy testing results were read and interpreted by myself, documented by clinical staff. Adequate positive and negative control.  Results discussed with patient/family.  Airborne Adult Perc - 03/14/22 0935     Time Antigen Placed 0935    Allergen Manufacturer Lavella Hammock    Location Back    Number of Test 59    Panel 1 Select    1. Control-Buffer 50% Glycerol Negative    2. Control-Histamine 1 mg/ml 3+    3. Albumin saline Negative    4. [place] Negative    5. [place] Negative    6. Johnson Negative    7. Boykin Blue Negative    8. Meadow Fescue Negative    9. Perennial Rye Negative    10. Sweet Vernal Negative    11. Timothy Negative    12. Cocklebur Negative    13. Burweed Marshelder Negative    14. Ragweed, short Negative    15. Ragweed, Giant Negative    16. Plantain,  English Negative    17. Lamb's Quarters Negative    18. Sheep Sorrell Negative    19. Rough Pigweed Negative    20. Marsh Elder, Rough Negative    21. Mugwort, Common Negative    22. Ash mix Negative    23. Birch mix Negative    24. Beech American Negative    25. Box, Elder Negative    26. Cedar, red Negative    27. Cottonwood, [place] Negative    28. Elm mix Negative  29. Hickory Negative    30. Maple mix Negative    31. Oak, [place] mix Negative    32. Pecan Pollen Negative    33. Pine mix Negative    34. Sycamore Eastern Negative    35. Ennis, Black Pollen Negative    36. Alternaria alternata Negative    37. Cladosporium Herbarum Negative    38. Aspergillus mix Negative    39. Penicillium mix Negative    40. Bipolaris sorokiniana (Helminthosporium) Negative    41.  Drechslera spicifera (Curvularia) Negative    42. Mucor plumbeus Negative    43. Fusarium moniliforme Negative    44. Aureobasidium pullulans (pullulara) Negative    45. Rhizopus oryzae Negative    46. Botrytis cinera Negative    47. Epicoccum nigrum Negative    48. Phoma betae Negative    49. Candida Albicans Negative    50. Trichophyton mentagrophytes Negative    51. Mite, D Farinae  5,000 AU/ml Negative    52. Mite, D Pteronyssinus  5,000 AU/ml Negative    53. Cat Hair 10,000 BAU/ml Negative    54.  Dog Epithelia Negative    55. Mixed Feathers Negative    56. Horse Epithelia Negative    57. Cockroach, German Negative    58. Mouse Negative    59. Tobacco Leaf Negative             Intradermal - 03/14/22 0952     Time Antigen Placed 0957    Allergen Manufacturer Lavella Hammock    Location Arm    Number of Test 15    Control Negative    Guatemala Negative    Johnson Negative    7 Grass Negative    Ragweed mix Negative    Weed mix Negative    Tree mix Negative    Mold 1 Negative    Mold 2 Negative    Mold 3 Negative    Mold 4 Negative    Cat Negative    Dog Negative    Cockroach Negative    Mite mix Negative             Food Adult Perc - 03/14/22 0900     Time Antigen Placed 3614    Allergen Manufacturer Lavella Hammock    Location Back    Number of allergen test 8    8. Shellfish Mix Negative    25. Shrimp Negative    26. Crab Negative    27. Lobster Negative    28. Oyster Negative    29. Scallops Negative    48. Avocado Negative    63. Pineapple Negative               Assessment:   1. Morbid obesity ([place])   2. Gastroesophageal reflux disease, unspecified whether esophagitis present   3. Anaphylactic shock due to shellfish, initial encounter   4. Pollen-food allergy, initial encounter   5. Chronic rhinitis     Plan/Recommendations:  Idiopathic Urticaria (Hives): - Possibly urticaria vs erythema annulare.  Picture that patient showed me appear to be  urticaria while pictures in chart from PCP appear like erythema annulare but can't tell if there is scaling.  - At this time etiology of hives and swelling is unknown. Hives can be caused by a variety of different triggers including illness/infection, exercise, pressure, vibrations, extremes of temperature to name a few however majority of the time there is no identifiable trigger.  -If persistent at next visit, we will obtain labs to  rule out inflammatory/autoimmune/alpha gal/mast cell causes. - Continue Zyrtec '10mg'$  daily. - If hives return, increase to Zyrtec '10mg'$  twice daily. - If they continue, add Pepcid '20mg'$  twice daily and continue Zyrtec '10mg'$  twice daily.  History of Penicillin Allergy - Your risk of penicillin allergy is low so I would recommend doing a direct oral challenge.  Hold anti-histamines like Zyrtec 3 days prior.  Please pick up the Amoxicillin from pharmacy and bring this with you for the visit.  Do not take this at home.    Chronic Rhinitis: - Positive skin test 03/2022: none - Avoidance measures discussed. - Use nasal saline rinses before nose sprays such as with Neilmed Sinus Rinse.  Use distilled water.   - Use Flonase 2 sprays each nostril daily. Aim upward and outward. - Use Zyrtec 10 mg daily as needed for runny nose, itchy watery eyes.   Food allergy:  - please strictly avoid shellfish, avocado, pineapple. Reactions to avocado and pineapple might oral allergy syndrome.  Reaction to shellfish concerning for anaphylaxis.  - SPT 03/2022 was negative for shellfish, avocado, pineapple.  Will obtain sIgE to confirm. If both are negative for shellfish, we can do oral challenge to shellfish in clinic.   - for SKIN only reaction, okay to take Benadryl '25mg'$  capsules every 6 hours  Oral Allergy Syndrome- Pineapple/Avocado: - These symptoms are typically not life-threatening and are because of a cross reaction between a pollen you are allergic to, and to a protein in specific  foods (such as fresh fruits, vegetables, and nuts). - If you can eat these things and tolerate the symptoms, it is fine to continue to do so.  If not, you may avoid these fresh fruits and vegetables.   - Heating these foods, buying them canned, and peeling these foods should allow them to be consumed without symptoms or with less symptoms.  GERD: - Continue Omperazole '20mg'$  daily -Avoid lying down for at least two hours after a meal or after drinking acidic beverages, like soda, or other caffeinated beverages. This can help to prevent stomach contents from flowing back into the esophagus. -Keep your head elevated while you sleep. Using an extra pillow or two can also help to prevent reflux. -Eat smaller and more frequent meals each day instead of a few large meals. This promotes digestion and can aid in preventing heartburn. -Wear loose-fitting clothes to ease pressure on the stomach, which can worsen heartburn and reflux. -Reduce excess weight around the midsection. This can ease pressure on the stomach. Such pressure can force some stomach contents back up the esophagus.   Reactive Airway Disease - Follow up with Pulm.  Discussed to use the Advair 115-21 2 puffs twice daily as prescribed by them.    Return in about 2 weeks (around 03/28/2022).  8:30 AM on 03/28/2022 Penicillin Challenge okay to overbook 6 weeks for regular follow up     Return in about 2 weeks (around 03/28/2022).  Harlon Flor, MD Allergy and [place]    12 mins chart review

## 2022-03-14 NOTE — Patient Instructions (Addendum)
Idiopathic Urticaria (Hives): - Possibly urticaria vs erythema annulare.  Picture that patient showed me appear to be urticaria while pictures in chart from PCP appear like erythema annulare but can't tell if there is scaling.  - At this time etiology of hives and swelling is unknown. Hives can be caused by a variety of different triggers including illness/infection, exercise, pressure, vibrations, extremes of temperature to name a few however majority of the time there is no identifiable trigger.  -If persistent at next visit, we will obtain labs to rule out inflammatory/autoimmune/alpha gal/mast cell causes. - Continue Zyrtec '10mg'$  daily. - If hives return, increase to Zyrtec '10mg'$  twice daily. - If they continue, add Pepcid '20mg'$  twice daily and continue Zyrtec '10mg'$  twice daily.  History of Penicillin Allergy - Your risk of penicillin allergy is low so I would recommend doing a direct oral challenge.  Hold anti-histamines like Zyrtec 3 days prior.  Please pick up the Amoxicillin from pharmacy and bring this with you for the visit.  Do not take this at home.    Chronic Rhinitis: - Positive skin test 03/2022: none - Avoidance measures discussed. - Use nasal saline rinses before nose sprays such as with Neilmed Sinus Rinse.  Use distilled water.   - Use Flonase 2 sprays each nostril daily. Aim upward and outward. - Use Zyrtec 10 mg daily as needed for runny nose, itchy watery eyes.   Food allergy:  - please strictly avoid shellfish, avocado, pineapple. Reactions to avocado and pineapple might oral allergy syndrome.  Reaction to shellfish concerning for anaphylaxis.  - SPT 03/2022 was negative.  Will obtain sIgE to confirm. If both are negative for shellfish, we can do oral challenge to shellfish in clinic.   - for SKIN only reaction, okay to take Benadryl '25mg'$  capsules every 6 hours  Oral Allergy Syndrome- Pineapple/Avocado: - These symptoms are typically not life-threatening and are because of a  cross reaction between a pollen you are allergic to, and to a protein in specific foods (such as fresh fruits, vegetables, and nuts). - If you can eat these things and tolerate the symptoms, it is fine to continue to do so.  If not, you may avoid these fresh fruits and vegetables.   - Heating these foods, buying them canned, and peeling these foods should allow them to be consumed without symptoms or with less symptoms.  GERD: - Continue Omperazole '20mg'$  daily -Avoid lying down for at least two hours after a meal or after drinking acidic beverages, like soda, or other caffeinated beverages. This can help to prevent stomach contents from flowing back into the esophagus. -Keep your head elevated while you sleep. Using an extra pillow or two can also help to prevent reflux. -Eat smaller and more frequent meals each day instead of a few large meals. This promotes digestion and can aid in preventing heartburn. -Wear loose-fitting clothes to ease pressure on the stomach, which can worsen heartburn and reflux. -Reduce excess weight around the midsection. This can ease pressure on the stomach. Such pressure can force some stomach contents back up the esophagus.   Reactive Airway Disease - Follow up with Pulm.  Discussed to use the Advair 115-21 2 puffs twice daily as prescribed by them.    Return in about 2 weeks (around 03/28/2022).  8:30 AM on 03/28/2022 Penicillin Challenge okay to overbook 6 weeks for regular follow up

## 2022-03-17 MED ORDER — CLONAZEPAM 0.5 MG PO TABS: ORAL_TABLET | ORAL | 1 refills | Status: DC

## 2022-03-18 LAB — ALLERGEN, PINEAPPLE, F210: Pineapple IgE: <0.1 kU/L

## 2022-03-18 LAB — ALLERGEN PROFILE, SHELLFISH
Clam IgE: <0.1 kU/L
F023-IgE Crab: <0.1 kU/L
F080-IgE Lobster: <0.1 kU/L
F290-IgE Oyster: <0.1 kU/L
Scallop IgE: <0.1 kU/L
Shrimp IgE: <0.1 kU/L

## 2022-03-18 LAB — ALLERGEN AVOCADO F96: F096-IgE Avocado: <0.1 kU/L

## 2022-03-20 ENCOUNTER — Ambulatory Visit (INDEPENDENT_AMBULATORY_CARE_PROVIDER_SITE_OTHER): Payer: BC Managed Care – PPO | Admitting: Pulmonary Disease

## 2022-03-20 ENCOUNTER — Encounter: Payer: Self-pay | Admitting: Pulmonary Disease

## 2022-03-20 VITALS — BP 120/74 | HR 75 | Ht 60.0 in | Wt 283.0 lb

## 2022-03-20 DIAGNOSIS — J4520 Mild intermittent asthma, uncomplicated: Secondary | ICD-10-CM

## 2022-03-20 NOTE — Patient Instructions (Addendum)
Continue to use advair HFA inhaler 2 puffs twice daily as needed   We will consider checking complete pulmonary function tests and a CT Chest scan in the future if anything changes with your breathing.   Follow up in 1 year.

## 2022-03-20 NOTE — Progress Notes (Signed)
Synopsis: Referred in November 2023 for cough and shortness of breath by Andrena Mews, MD  Subjective:   PATIENT ID: Frances Medina GENDER: female DOB: 10-09-1960, MRN: [id number]  HPI  Chief Complaint  Patient presents with   Follow-up    2 mo f/u. States her insurance denied her CT scan.    Frances Medina is a 62 year old woman, former smoker with GERD, hypertension, OSA on CPAP, seasonal allergies, positive ANA in 2017, myasthenia gravis and asthma who returns to pulmonary clinic for cough and dyspnea.   Her cough has improved with the advair 115-67mg 2 puffs twice day. She has been mostly using it as needed. No other complaints at this time.  Initial OV 01/20/22 PFTs in 2018 showed mild restrictive defect and significant bronchodilator response with out evidence of obstruction.   In 2017 she reports being in ICU and had trach placed and was diagnosed with myasthenia gravis. [facility]   She reports intermittent cough with phlegm production that is clear in color. She does have sinus congestion and post nasal drainage. She has intermittent wheezing when the cough is bad. She is using as needed albuterol inhaler and nebulizer treatments. She is not using these medications on a weekly basis. She also has headaches. She reports developing a rash on her arms and legs about 1 week ago. She is in a bariatric weight loss program and has lost 45lbs since last year [facility].  She is not currently working and has been on disability since her critical illness in 2017. 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 She quit smoking 10 years ago and smoked for 37 years, half a pack to 1 pack per day. She has 3 children, boy and 2 girls. 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   Past Medical History:  Diagnosis Date   [place] respiratory [place]    Acute respiratory failure with hypoxemia (HCC)    Aspiration into airway    Back pain 07/31/2017   Benign neoplasm of rectum    CAP (community acquired pneumonia) 08/05/2015   Depression with anxiety    Diverticulosis    Dysphagia 03/2015    EGD, Dr GCarlean Purl mild antral gastritis, ? due to Ibuprofen.  no stricture but empirically maloney dilated esophagus.    Dysphagia, neurologic    E. coli UTI 04/07/2015   Elevated LDH 05/06/2015   Encounter for routine gynecological examination 10/19/2015   Endotracheally intubated    Enteritis due to Clostridium difficile    Esophageal dysmotility 11/08/2015   Fibroid uterus    size of a dime   Gastrostomy infection ([facility] 11/08/2015   Gastrostomy infection ([facility] 11/08/2015   GERD (gastroesophageal reflux disease)    hiatal hernia   Hypertension    "went away when I stopped smoking"   Knee injury    Left hip pain 02/18/2017   Migraine    "maybe couple times/month" (03/20/2015)   Mild intermittent asthma 06/22/2017   Myasthenia gravis ([facility] 2017   Myasthenia gravis with acute exacerbation ([facility] 05/15/2015   Osteoarthritis of left knee 12/02/2013   Osteoarthritis of right knee 08/30/2013   Pancreatitis 07/25/2017   Protein-calorie malnutrition, severe ([facility] 08/07/2015   Seasonal allergies    takes  Zytrec   Tracheostomy status (HCC)    Tubular adenoma of colon    Tumors    "in my stomach"   Umbilical hernia    watching , no plans for surgery at present   Urine incontinence 10/19/2015   VAP (ventilator-associated pneumonia) ([place])      Family History  Problem Relation Age of Onset   Heart disease Mother 9   Hypertension Mother    Stroke Father    Hypertension Father    Atopy Sister    Allergic rhinitis Sister    Other Sister        Esophageal strictures   Allergic rhinitis Sister    Anemia Sister    Leukemia Sister    Leukemia Sister    COPD Brother     Colon cancer Neg Hx      Social History   Socioeconomic History   Marital status: Married    Spouse name: Not on file   Number of children: 3   Years of education: Not on file   Highest education level: Not on file  Occupational History   Occupation: disabled  Tobacco Use   Smoking status: Former    Packs/day: 1.50    Years: 38.00    Total pack years: 57.00    Types: Cigarettes    Start date: 03/03/1974    Quit date: 05/14/2012    Years since quitting: 9.8   Smokeless tobacco: Never   Tobacco comments:    denies thoughts of restarting  Vaping Use   Vaping Use: Former  Substance and Sexual Activity   Alcohol use: No    Alcohol/week: 0.0 standard drinks of alcohol   Drug use: No   Sexual activity: Not Currently    Birth control/protection: Surgical  Other Topics Concern   Not on file  Social History Narrative   Work or School: [employer] Situation: lives with husband and daughters      Spiritual Beliefs: Christian      Lifestyle: no regular exercise; trying to eat healthy      [facility] (09/11/16):   Originally from [facility]. Has always lived in [place]. No pets currently. Does have carpet in her home in her bedroom. No bird exposure. Previously had mold under her kitchen sink. No indoor plants. Previously was working in [employer].          Social Determinants of Health   Financial Resource Strain: Not on file  Food Insecurity: No Food Insecurity (03/10/2022)   Hunger Vital Sign    Worried About Running Out of Food in the Last Year: Never true    Ran Out of Food in the Last Year: Never true  Transportation Needs: No Transportation Needs (03/10/2022)   PRAPARE - Hydrologist (Medical): No    Lack of Transportation (Non-Medical): No  Physical Activity: Insufficiently Active (03/10/2022)   Exercise Vital Sign    Days of Exercise per Week: 2 days    Minutes of Exercise per Session: 60 min   Stress: Not on file  Social Connections: Not on file  Intimate Partner Violence: Not At Risk (03/10/2022)   Humiliation, Afraid, Rape, and Kick questionnaire    Fear of Current or Ex-Partner: No    Emotionally Abused: No    Physically Abused: No    Sexually Abused: No     Allergies  Allergen Reactions   Ciprofloxacin Shortness Of Breath and Rash  Naproxen Shortness Of Breath   Shrimp [Shellfish Allergy] Hives and Shortness Of Breath    ER required   Dicyclomine Hives   Methocarbamol Hives   Penicillins Hives    Has patient had a PCN reaction causing immediate rash, facial/tongue/throat swelling, SOB or lightheadedness with hypotension: Yes Has patient had a PCN reaction causing severe rash involving mucus membranes or skin necrosis: No Has patient had a PCN reaction that required hospitalization No Has patient had a PCN reaction occurring within the last 10 years: No If all of the above answers are "NO", then may proceed with Cephalosporin use.    Pineapple Itching    Itchy lips and tongue   Morphine And Related Other (See Comments)    Severe headache     Outpatient Medications Prior to Visit  Medication Sig Dispense Refill   acetaminophen (TYLENOL) 500 MG tablet Take 500 mg by mouth in the morning and at bedtime.     albuterol (PROVENTIL) (2.5 MG/3ML) 0.083% nebulizer solution USE 1 VIAL IN NEBULIZER EVERY 6 HOURS AS NEEDED FOR SHORTNESS OF BREATH AND WHEEZING 150 mL 0   albuterol (VENTOLIN HFA) 108 (90 Base) MCG/ACT inhaler INHALE 2 PUFFS INTO THE LUNGS EVERY 6 HOURS AS NEEDED FOR WHEEZE OR SHORTNESS OF BREATH 18 g 3   APPLE CIDER VINEGAR PO Take 1 capsule by mouth daily.     Ascorbic Acid (VITAMIN C PO) Take 1,000 mg by mouth daily.     baclofen (LIORESAL) 10 MG tablet TAKE 1 TABLET BY MOUTH AT BEDTIME AS NEEDED FOR MUSCLE SPASMS 90 tablet 1   benzonatate (TESSALON) 100 MG capsule TAKE 1 CAPSULE BY MOUTH TWICE A DAY AS NEEDED FOR COUGH 20 capsule 1   cetirizine (ZYRTEC) 10  MG tablet Take 1 tablet (10 mg total) by mouth 2 (two) times daily as needed for allergies (hives). 60 tablet 5   clonazePAM (KLONOPIN) 0.5 MG tablet TAKE 1 TABLET BY MOUTH TWICE A DAY AS NEEDED FOR ANXIETY 60 tablet 1   Cyanocobalamin (VITAMIN B12 PO) Take 1 tablet by mouth daily.     famotidine (PEPCID) 20 MG tablet Take 1 tablet (20 mg total) by mouth 2 (two) times daily as needed (hives). 60 tablet 5   fluticasone (FLONASE) 50 MCG/ACT nasal spray Place 2 sprays into both nostrils daily. 16 g 6   fluticasone-salmeterol (ADVAIR HFA) 115-21 MCG/ACT inhaler Inhale 2 puffs into the lungs every 12 (twelve) hours as needed. 1 each 12   gabapentin (NEURONTIN) 300 MG capsule Take 1 capsule (300 mg total) by mouth 2 (two) times daily. 60 capsule 1   Menthol 10 MG LOZG Take 1 lozenge by mouth daily as needed (cough).     montelukast (SINGULAIR) 10 MG tablet TAKE 1 TABLET BY MOUTH EVERYDAY AT BEDTIME 90 tablet 1   mycophenolate (CELLCEPT) 500 MG tablet Take 1,000 mg by mouth 2 (two) times daily.     mycophenolate (CELLCEPT) 500 MG tablet Take 500 mg by mouth 2 (two) times daily.     nystatin (MYCOSTATIN/NYSTOP) powder Apply 1 application. topically 3 (three) times daily. 15 g 0   omeprazole (PRILOSEC) 20 MG capsule TAKE ONE TABLET DAILY AS NEEDED 90 capsule 1   Semaglutide-Weight Management 2.4 MG/0.75ML SOAJ Inject 2.4 mg as directed once a week.     sertraline (ZOLOFT) 50 MG tablet TAKE 3 TABLETS BY MOUTH EVERY DAY 270 tablet 1   VITAMIN D, CHOLECALCIFEROL, PO Take 1 tablet by mouth daily. otc  No facility-administered medications prior to visit.    Review of Systems  Constitutional:  Negative for chills, fever, malaise/fatigue and weight loss.  HENT:  Negative for congestion, sinus pain and sore throat.   Eyes: Negative.   Respiratory:  Positive for shortness of breath (with exertion). Negative for cough, hemoptysis, sputum production and wheezing.   Cardiovascular:  Negative for chest pain,  palpitations, orthopnea, claudication and leg swelling.  Gastrointestinal:  Negative for abdominal pain, heartburn, nausea and vomiting.  Genitourinary: Negative.   Musculoskeletal:  Positive for joint pain. Negative for myalgias.  Skin:  Negative for rash.  Neurological:  Negative for weakness.  Psychiatric/Behavioral:  Positive for depression. The patient is nervous/anxious.    Objective:   Vitals:   03/20/22 0823  BP: 120/74  Pulse: 75  SpO2: 98%  Weight: 283 lb (128.4 kg)  Height: 5' (1.524 m)   Physical Exam Constitutional:      General: She is not in acute distress.    Appearance: She is obese. She is not ill-appearing.  HENT:     Head: Normocephalic and atraumatic.  Eyes:     General: No scleral icterus.    Conjunctiva/sclera: Conjunctivae normal.  Cardiovascular:     Rate and Rhythm: Normal rate and regular rhythm.     Pulses: Normal pulses.     Heart sounds: Normal heart sounds. No murmur heard. Pulmonary:     Effort: Pulmonary effort is normal.     Breath sounds: Normal breath sounds. No wheezing, rhonchi or rales.  Musculoskeletal:     Right lower leg: No edema.     Left lower leg: No edema.  Skin:    General: Skin is warm and dry.  Neurological:     General: No focal deficit present.     Mental Status: She is alert.    CBC    Component Value Date/Time   WBC 6.9 11/26/2021 0903   WBC 8.3 05/05/2020 0332   RBC 5.29 (H) 11/26/2021 0903   RBC 5.35 (H) 05/05/2020 0332   HGB 11.2 11/26/2021 0903   HGB 12.4 10/30/2015 1112   HCT 36.5 11/26/2021 0903   HCT 38.9 10/30/2015 1112   PLT 171 11/26/2021 0903   MCV 69 (L) 11/26/2021 0903   MCV 72.2 (L) 10/30/2015 1112   MCH 21.2 (L) 11/26/2021 0903   MCH 22.1 (L) 05/05/2020 0332   MCHC 30.7 (L) 11/26/2021 0903   MCHC 30.3 05/05/2020 0332   RDW 18.5 (H) 11/26/2021 0903   RDW 19.0 (H) 10/30/2015 1112   LYMPHSABS 2.1 05/02/2020 2319   LYMPHSABS 1.8 05/02/2019 1212   LYMPHSABS 1.0 10/30/2015 1112   MONOABS  0.4 05/02/2020 2319   MONOABS 0.4 10/30/2015 1112   EOSABS 0.0 05/02/2020 2319   EOSABS 0.0 05/02/2019 1212   BASOSABS 0.0 05/02/2020 2319   BASOSABS 0.0 05/02/2019 1212   BASOSABS 0.0 10/30/2015 1112      Latest Ref Rng & Units 11/26/2021    9:03 AM 05/05/2020    3:32 AM 05/04/2020    4:20 AM  BMP  Glucose 70 - 99 mg/dL 82  109  111   BUN 8 - 27 mg/dL '12  14  8   '$ Creatinine 0.57 - 1.00 mg/dL 0.65  0.66  0.64   BUN/Creat Ratio 12 - 28 18     Sodium 134 - 144 mmol/L 141  141  139   Potassium 3.5 - 5.2 mmol/L 4.0  4.1  3.3   Chloride 96 - 106 mmol/L  103  104  103   CO2 20 - 29 mmol/L '25  27  26   '$ Calcium 8.7 - 10.3 mg/dL 9.3  9.5  9.2    Chest imaging: CTA Chest 05/03/20 Cardiovascular: Satisfactory pulmonary artery opacification but there is bolus dispersion, artifact from body habitus, and multiple areas of streak artifact. No visible pulmonary embolism. Normal heart size. No pericardial effusion. Negative aorta.   Mediastinum/Nodes: Negative for adenopathy or mass.   Lungs/Pleura: Band of atelectasis at the bases, subsegmental. There is no edema, consolidation, effusion, or pneumothorax.   Upper Abdomen: No acute finding.   Musculoskeletal: Spondylosis with bridging osteophytes in the lower thoracic spine.  PFT:    Latest Ref Rng & Units 10/27/2016    9:36 AM  PFT Results  FVC-Pre L 1.57   FVC-Predicted Pre % 66   FVC-Post L 1.66   FVC-Predicted Post % 70   Pre FEV1/FVC % % 83   Post FEV1/FCV % % 92   FEV1-Pre L 1.30   FEV1-Predicted Pre % 70   FEV1-Post L 1.52   DLCO uncorrected ml/min/mmHg 15.92   DLCO UNC% % 84   DLCO corrected ml/min/mmHg 15.64   DLCO COR %Predicted % 82   DLVA Predicted % 131   TLC L 3.49   TLC % Predicted % 78   RV % Predicted % 100     Labs:  Path:  Echo:  Heart Catheterization:  Sleep Study CPAP Titration 2021 Date of NPSG, Split Night or HST:   NPSG 11/19/15  AHI 10/ hr, desatturation to 86%, body weight 264 lbs     IMPRESSIONS - The optimal PAP pressure was 12 cm of water. - Central sleep apnea was not noted during this titration (CAI = 0.2/h). - Significant oxygen desaturations were not observed during this titration (min O2 = 90.0%). - No snoring was audible during this study. - No cardiac abnormalities were observed during this study. - Clinically significant periodic limb movements were not noted during this study. Arousals associated with PLMs were rare.  Assessment & Plan:   Mild intermittent asthma without complication  Discussion: Tatayana Beshears is a 62 year old woman, former smoker with GERD, hypertension, OSA on CPAP, seasonal allergies, positive ANA in 2017, myasthenia gravis and asthma who returns to pulmonary clinic for cough and dyspnea.   Her history is consistent with mild intermittent asthma with improvement in her symptoms with ICS/LABA inhaler therapy.   She is to continue advair HFA 115-12mg 2 puffs twice daily as needed.  HRCT Chest was denied by insurance. We will hold off on further testing as her breathing is improved at this time.  Follow up in 1 year.  JFreda Jackson MD [facility] Office: [phone]  Current Outpatient Medications:    acetaminophen (TYLENOL) 500 MG tablet, Take 500 mg by mouth in the morning and at bedtime., Disp: , Rfl:    albuterol (PROVENTIL) (2.5 MG/3ML) 0.083% nebulizer solution, USE 1 VIAL IN NEBULIZER EVERY 6 HOURS AS NEEDED FOR SHORTNESS OF BREATH AND WHEEZING, Disp: 150 mL, Rfl: 0   albuterol (VENTOLIN HFA) 108 (90 Base) MCG/ACT inhaler, INHALE 2 PUFFS INTO THE LUNGS EVERY 6 HOURS AS NEEDED FOR WHEEZE OR SHORTNESS OF BREATH, Disp: 18 g, Rfl: 3   APPLE CIDER VINEGAR PO, Take 1 capsule by mouth daily., Disp: , Rfl:    Ascorbic Acid (VITAMIN C PO), Take 1,000 mg by mouth daily., Disp: , Rfl:    baclofen (LIORESAL) 10  MG tablet, TAKE 1 TABLET BY MOUTH AT BEDTIME AS NEEDED FOR MUSCLE SPASMS, Disp: 90 tablet, Rfl: 1    benzonatate (TESSALON) 100 MG capsule, TAKE 1 CAPSULE BY MOUTH TWICE A DAY AS NEEDED FOR COUGH, Disp: 20 capsule, Rfl: 1   cetirizine (ZYRTEC) 10 MG tablet, Take 1 tablet (10 mg total) by mouth 2 (two) times daily as needed for allergies (hives)., Disp: 60 tablet, Rfl: 5   clonazePAM (KLONOPIN) 0.5 MG tablet, TAKE 1 TABLET BY MOUTH TWICE A DAY AS NEEDED FOR ANXIETY, Disp: 60 tablet, Rfl: 1   Cyanocobalamin (VITAMIN B12 PO), Take 1 tablet by mouth daily., Disp: , Rfl:    famotidine (PEPCID) 20 MG tablet, Take 1 tablet (20 mg total) by mouth 2 (two) times daily as needed (hives)., Disp: 60 tablet, Rfl: 5   fluticasone (FLONASE) 50 MCG/ACT nasal spray, Place 2 sprays into both nostrils daily., Disp: 16 g, Rfl: 6   fluticasone-salmeterol (ADVAIR HFA) 115-21 MCG/ACT inhaler, Inhale 2 puffs into the lungs every 12 (twelve) hours as needed., Disp: 1 each, Rfl: 12   gabapentin (NEURONTIN) 300 MG capsule, Take 1 capsule (300 mg total) by mouth 2 (two) times daily., Disp: 60 capsule, Rfl: 1   Menthol 10 MG LOZG, Take 1 lozenge by mouth daily as needed (cough)., Disp: , Rfl:    montelukast (SINGULAIR) 10 MG tablet, TAKE 1 TABLET BY MOUTH EVERYDAY AT BEDTIME, Disp: 90 tablet, Rfl: 1   mycophenolate (CELLCEPT) 500 MG tablet, Take 1,000 mg by mouth 2 (two) times daily., Disp: , Rfl:    mycophenolate (CELLCEPT) 500 MG tablet, Take 500 mg by mouth 2 (two) times daily., Disp: , Rfl:    nystatin (MYCOSTATIN/NYSTOP) powder, Apply 1 application. topically 3 (three) times daily., Disp: 15 g, Rfl: 0   omeprazole (PRILOSEC) 20 MG capsule, TAKE ONE TABLET DAILY AS NEEDED, Disp: 90 capsule, Rfl: 1   Semaglutide-Weight Management 2.4 MG/0.75ML SOAJ, Inject 2.4 mg as directed once a week., Disp: , Rfl:    sertraline (ZOLOFT) 50 MG tablet, TAKE 3 TABLETS BY MOUTH EVERY DAY, Disp: 270 tablet, Rfl: 1   VITAMIN D, CHOLECALCIFEROL, PO, Take 1 tablet by mouth daily. otc, Disp: , Rfl:

## 2022-03-21 ENCOUNTER — Other Ambulatory Visit: Payer: Self-pay | Admitting: Family Medicine

## 2022-03-21 ENCOUNTER — Encounter: Payer: Self-pay | Admitting: Family Medicine

## 2022-03-21 DIAGNOSIS — Z122 Encounter for screening for malignant neoplasm of respiratory organs: Secondary | ICD-10-CM

## 2022-03-21 NOTE — Telephone Encounter (Signed)
I called and discussed with the patient. She wishes to proceed with the CT lung cancer screen.

## 2022-03-21 NOTE — Progress Notes (Signed)
Called patient to try to schedule her CT but pt informed me that her pulmonologist ordered the same screening. She stated that she can not get another one done at the moment because her insurance will not pay for it.

## 2022-03-28 ENCOUNTER — Encounter: Payer: Self-pay | Admitting: Internal Medicine

## 2022-03-28 ENCOUNTER — Ambulatory Visit (INDEPENDENT_AMBULATORY_CARE_PROVIDER_SITE_OTHER): Payer: BC Managed Care – PPO | Admitting: Internal Medicine

## 2022-03-28 ENCOUNTER — Other Ambulatory Visit: Payer: Self-pay

## 2022-03-28 ENCOUNTER — Other Ambulatory Visit: Payer: Self-pay | Admitting: Family Medicine

## 2022-03-28 VITALS — BP 138/86 | HR 68 | Temp 97.2°F | Resp 18 | Ht 60.0 in | Wt 285.6 lb

## 2022-03-28 DIAGNOSIS — T50905A Adverse effect of unspecified drugs, medicaments and biological substances, initial encounter: Secondary | ICD-10-CM

## 2022-03-28 DIAGNOSIS — Z880 Allergy status to penicillin: Secondary | ICD-10-CM | POA: Diagnosis not present

## 2022-03-28 DIAGNOSIS — L500 Allergic urticaria: Secondary | ICD-10-CM | POA: Diagnosis not present

## 2022-03-28 DIAGNOSIS — T360X5D Adverse effect of penicillins, subsequent encounter: Secondary | ICD-10-CM

## 2022-03-28 NOTE — Patient Instructions (Addendum)
History of Penicillin Allergy Tolerated 63m of amoxicillin '250mg'$ /553mtoday.  For next 24 hours monitor for hives, swelling, shortness of breath and dizziness. If you see these symptoms, use Benadryl for mild symptoms and for more severe symptoms go to urgent care/ER or call 911.  Your risk of developing penicillin allergy in the future is the same as the general population's.  May take penicillin type of antibiotics in the future if needed. I will remove penicillin allergy from the chart.

## 2022-03-28 NOTE — Progress Notes (Signed)
FOLLOW UP Date of Service/Encounter:  03/28/22   Subjective:  Frances Medina (DOB: 10-02-1960) is a 62 y.o. female who returns to the [facility] on 03/28/2022 for follow up for amoxicillin challenge.   History obtained from: chart review and patient. Last seen 03/14/2022 by me for rash (urticaria vs erythema annulare), chronic rhinitis, penicillin allergy, GERD and food allergies.  Also followed by [facility] for mild intermittent asthma, last visit with Dr. Erin Fulling 03/20/2022 on Advair HFA 115-21 2 puffs BID PRN.  They had considered HRCT but are holding off as insurance denied it.   Since last visit, she reports doing well.  Asthma is well too, not having much symptoms or needing her Advair/Albuterol. She has also lost weight through the bariatric program.  Past Medical History: Past Medical History:  Diagnosis Date   Acute respiratory failure ([place])    Acute respiratory failure with hypoxemia (HCC)    Aspiration into airway    Back pain 07/31/2017   Benign neoplasm of rectum    CAP (community acquired pneumonia) 08/05/2015   Depression with anxiety    Diverticulosis    Dysphagia 03/2015    EGD, Dr Carlean Purl. mild antral gastritis, ? due to Ibuprofen.  no stricture but empirically maloney dilated esophagus.    Dysphagia, neurologic    E. coli UTI 04/07/2015   Elevated LDH 05/06/2015   Encounter for routine gynecological examination 10/19/2015   Endotracheally intubated    Enteritis due to Clostridium difficile    Esophageal dysmotility 11/08/2015   Fibroid uterus    size of a dime   Gastrostomy infection ([place]) 11/08/2015   Gastrostomy infection ([place]) 11/08/2015   GERD (gastroesophageal reflux disease)    hiatal hernia   Hypertension    "went away when I stopped smoking"   Knee injury    Left hip pain 02/18/2017   Migraine    "maybe couple times/month" (03/20/2015)   Mild intermittent asthma 06/22/2017   Myasthenia gravis ([place]) 2017   Myasthenia gravis with acute exacerbation  ([place]) 05/15/2015   Osteoarthritis of left knee 12/02/2013   Osteoarthritis of right knee 08/30/2013   Pancreatitis 07/25/2017   Protein-calorie malnutrition, severe ([place]) 08/07/2015   Seasonal allergies    takes Zytrec   Tracheostomy status ([place])    Tubular adenoma of colon    Tumors    "in my stomach"   Umbilical hernia    watching , no plans for surgery at present   Urine incontinence 10/19/2015   VAP (ventilator-associated pneumonia) (HCC)     Objective:  BP 138/86   Pulse 68   Temp (!) 97.2 F (36.2 C) (Temporal)   Resp 18   Ht 5' (1.524 m)   Wt 285 lb 9.6 oz (129.5 kg)   SpO2 98%   BMI 55.78 kg/m  Body mass index is 55.78 kg/m. Physical Exam: GEN: alert, well developed HEENT: clear conjunctiva, TM grey and translucent, nose with moderate inferior turbinate hypertrophy, pink nasal mucosa, no rhinorrhea, no cobblestoning HEART: regular rate and rhythm, no murmur LUNGS: clear to auscultation bilaterally, no coughing, unlabored respiration SKIN: no rashes or lesions  Amoxicillin Oral Challenge Performed as a two step challenge: 10% of dose ('50mg'$ ) followed by 90% of dose ('450mg'$ ) 30 minutes later.  After the last dose, she was observed for over 2 hours.  She tolerated the amoxicillin challenge without any reactions.  Start time: 8:41 AM End time: 12:15 PM  Post Physical Exam: GEN: alert, well developed HEENT: clear conjunctiva,  TM grey and translucent, nose with moderate inferior turbinate hypertrophy, pink nasal mucosa, no rhinorrhea, no cobblestoning HEART: regular rate and rhythm, no murmur LUNGS: clear to auscultation bilaterally, no coughing, unlabored respiration SKIN: no rashes or lesions   Assessment:   1. Urticaria due to drug allergy   2. Adverse effect of penicillin, subsequent encounter     Plan/Recommendations:    Patient Instructions  History of Penicillin Allergy Tolerated 58m of amoxicillin '250mg'$ /529mtoday.  For next 24 hours monitor for hives,  swelling, shortness of breath and dizziness. If you see these symptoms, use Benadryl for mild symptoms and for more severe symptoms go to urgent care/ER or call 911.  Your risk of developing penicillin allergy in the future is the same as the general population's.  May take penicillin type of antibiotics in the future if needed. I will remove penicillin allergy from the chart.    No follow-ups on file.  PrHarlon FlorMD [facility] AsRuidos[facility]

## 2022-04-03 DIAGNOSIS — G7000 Myasthenia gravis without (acute) exacerbation: Secondary | ICD-10-CM | POA: Diagnosis not present

## 2022-04-08 ENCOUNTER — Ambulatory Visit (HOSPITAL_COMMUNITY): Payer: BC Managed Care – PPO

## 2022-04-15 ENCOUNTER — Ambulatory Visit (HOSPITAL_COMMUNITY)
Admission: RE | Admit: 2022-04-15 | Discharge: 2022-04-15 | Disposition: A | Discharge status: Home / Self Care | Payer: BC Managed Care – PPO | Source: Ambulatory Visit | Attending: Family Medicine | Admitting: Family Medicine

## 2022-04-15 ENCOUNTER — Telehealth: Payer: Self-pay | Admitting: Family Medicine

## 2022-04-15 DIAGNOSIS — Z122 Encounter for screening for malignant neoplasm of respiratory organs: Secondary | ICD-10-CM | POA: Diagnosis not present

## 2022-04-15 DIAGNOSIS — Z87891 Personal history of nicotine dependence: Secondary | ICD-10-CM | POA: Diagnosis not present

## 2022-04-15 NOTE — Telephone Encounter (Signed)
HIPAA compliant callback message left.  Please advise normal lung cancer CT scan result. Plan to repeat in a year.  Dr. Gwendlyn Deutscher.

## 2022-04-16 NOTE — Telephone Encounter (Signed)
Normal CT lung cancer screen discussed. Repeat in 1 year. She agreed with the plan.

## 2022-04-17 ENCOUNTER — Ambulatory Visit: Payer: Self-pay

## 2022-04-17 NOTE — Patient Instructions (Signed)
Visit Information  Thank you for taking time to visit with me today. Please don't hesitate to contact me if I can be of assistance to you.   Following are the goals we discussed today:   Goals Addressed               This Visit's Progress     I want to continue on my weight loss journey and monitor my breathing (pt-stated)        Patient Goals/Self Care Activities: -Patient/Caregiver will self-administer medications as prescribed as evidenced by self-report/primary caregiver report  -Patient/Caregiver will attend all scheduled provider appointments as evidenced by clinician review of documented attendance to scheduled appointments and patient/caregiver report -Patient/Caregiver will call pharmacy for medication refills as evidenced by patient report and review of pharmacy fill history as appropriate -Patient/Caregiver will call provider office for new concerns or questions as evidenced by review of documented incoming telephone call notes and patient report -Patient/Caregiver verbalizes understanding of plan  -Avoid smoke and air pollution -Keep your airway clear from mucus build up  -Use a humidifier, if needed -Utilize infection prevention strategies to reduce risk of respiratory infection   - stock up on healthy food choices - track current amount and type of exercise - track feelings, emotional and physical, when eating - drink 6 to 8 glasses of water each day - set a realistic goal  Move as tolerated and rest when needed         Our next appointment is by telephone on 06/17/22 at 1130 am  Please call the care guide team at [phone] if you need to cancel or reschedule your appointment.   If you are experiencing a Mental Health or [place] or need someone to talk to, please call 1-800-273-TALK (toll free, 24 hour hotline)  Patient verbalizes understanding of instructions and care plan provided today and agrees to view in [place]. Active MyChart status  and patient understanding of how to access instructions and care plan via MyChart confirmed with patient.     [name] RN, BSN, [facility]   Phone: [phone]

## 2022-04-17 NOTE — Patient Outreach (Signed)
  Care Coordination   Follow Up Visit Note   04/17/2022 Name: CASSIDIE VEIGA MRN: [id number] DOB: 1960/03/12  [name] is a 62 y.o. year old female who sees [name], MD for primary care. I spoke with  [name] by phone today.  What matters to the patients health and wellness today?  Mrs. Mullins reports that she has hit a plateau in her weight loss journey. She is maintaining a watchful eye on her diet and is keeping up with her exercise routine.She experiences a rash which is under control as long as she takes Dentist, an Albuterol inhaler, and Benedryl. However, the root cause of the rash is still unknown.      Goals Addressed               This Visit's Progress     I want to continue on my weight loss journey and monitor my breathing (pt-stated)        Patient Goals/Self Care Activities: -Patient/Caregiver will self-administer medications as prescribed as evidenced by self-report/primary caregiver report  -Patient/Caregiver will attend all scheduled provider appointments as evidenced by clinician review of documented attendance to scheduled appointments and patient/caregiver report -Patient/Caregiver will call pharmacy for medication refills as evidenced by patient report and review of pharmacy fill history as appropriate -Patient/Caregiver will call provider office for new concerns or questions as evidenced by review of documented incoming telephone call notes and patient report -Patient/Caregiver verbalizes understanding of plan  -Avoid smoke and air pollution -Keep your airway clear from mucus build up  -Use a humidifier, if needed -Utilize infection prevention strategies to reduce risk of respiratory infection   - stock up on healthy food choices - track current amount and type of exercise - track feelings, emotional and physical, when eating - drink 6 to 8 glasses of water each day - set a realistic goal  Move as tolerated and rest when needed          SDOH assessments and interventions completed:  No     Care Coordination Interventions:  Yes, provided   Interventions Today    Flowsheet Row Most Recent Value  Chronic Disease   Chronic disease during today's visit Other  [Weight loss and ILD]  General Interventions   General Interventions Discussed/Reviewed General Interventions Reviewed  Exercise Interventions   Exercise Discussed/Reviewed Exercise Discussed, Exercise Reviewed, Physical Activity  Physical Activity Discussed/Reviewed Physical Activity Discussed  Pharmacy Interventions   Pharmacy Dicussed/Reviewed Pharmacy Topics Reviewed        Follow up plan: Follow up call scheduled for 06/17/22  1130 am    Encounter Outcome:  Pt. Visit Completed   [name] RN, BSN, [facility]   Phone: [phone]

## 2022-04-29 ENCOUNTER — Ambulatory Visit: Payer: BC Managed Care – PPO | Admitting: Internal Medicine

## 2022-05-28 ENCOUNTER — Other Ambulatory Visit: Payer: Self-pay | Admitting: Family Medicine

## 2022-06-05 DIAGNOSIS — Z6841 Body Mass Index (BMI) 40.0 and over, adult: Secondary | ICD-10-CM | POA: Diagnosis not present

## 2022-06-17 ENCOUNTER — Ambulatory Visit: Payer: Self-pay

## 2022-06-17 NOTE — Patient Instructions (Signed)
Visit Information  Thank you for taking time to visit with me today. Please don't hesitate to contact me if I can be of assistance to you.   Following are the goals we discussed today:   Goals Addressed               This Visit's Progress     I want to continue on my weight loss journey and monitor my breathing (pt-stated)        Patient Goals/Self Care Activities: -Patient/Caregiver will self-administer medications as prescribed as evidenced by self-report/primary caregiver report  -Patient/Caregiver will attend all scheduled provider appointments as evidenced by clinician review of documented attendance to scheduled appointments and patient/caregiver report -Patient/Caregiver will call pharmacy for medication refills as evidenced by patient report and review of pharmacy fill history as appropriate -Patient/Caregiver will call provider office for new concerns or questions as evidenced by review of documented incoming telephone call notes and patient report -Patient/Caregiver verbalizes understanding of plan  -Avoid smoke and air pollution -Keep your airway clear from mucus build up  -Use a humidifier, if needed -Utilize infection prevention strategies to reduce risk of respiratory infection   - stock up on healthy food choices - track current amount and type of exercise - track feelings, emotional and physical, when eating - drink 6 to 8 glasses of water each day - set a realistic goal  Move as tolerated and rest when needed for exercise and stress         Our next appointment is by telephone on 08/15/22 at 11 am  Please call the care guide team at [phone] if you need to cancel or reschedule your appointment.   If you are experiencing a Mental Health or Behavioral Health Crisis or need someone to talk to, please call 1-800-273-TALK (toll free, 24 hour hotline)  Patient verbalizes understanding of instructions and care plan provided today and agrees to view in MyChart.  Active MyChart status and patient understanding of how to access instructions and care plan via MyChart confirmed with patient.     Juanell Fairly RN, BSN, [facility] Care Coordinator [facility]   Phone: [phone]

## 2022-06-17 NOTE — Patient Outreach (Signed)
  Care Coordination   Follow Up Visit Note   06/17/2022 Name: Frances Medina MRN: [id number] DOB: Jul 28, 1960  Frances Medina is a 62 y.o. year old female who sees Doreene Eland, MD for primary care. I spoke with  Frances Medina by phone today.  What matters to the patients health and wellness today?  Frances Medina is doing well, but she has stopped using Wegovy due to insurance not covering it anymore. Instead, she has been put on metformin which has caused no problems so far. Despite some stress, she is exercising regularly. Her daughter is about to graduate, and she is busy preparing for it. Although she uses inhalers for her shortness of breath during exercise, she dislikes using Advair because of the steroidsbut continues to use it.  Fortunately, the rash has finally gone.    Goals Addressed               This Visit's Progress     I want to continue on my weight loss journey and monitor my breathing (pt-stated)        Patient Goals/Self Care Activities: -Patient/Caregiver will self-administer medications as prescribed as evidenced by self-report/primary caregiver report  -Patient/Caregiver will attend all scheduled provider appointments as evidenced by clinician review of documented attendance to scheduled appointments and patient/caregiver report -Patient/Caregiver will call pharmacy for medication refills as evidenced by patient report and review of pharmacy fill history as appropriate -Patient/Caregiver will call provider office for new concerns or questions as evidenced by review of documented incoming telephone call notes and patient report -Patient/Caregiver verbalizes understanding of plan  -Avoid smoke and air pollution -Keep your airway clear from mucus build up  -Use a humidifier, if needed -Utilize infection prevention strategies to reduce risk of respiratory infection   - stock up on healthy food choices - track current amount and type of exercise - track feelings,  emotional and physical, when eating - drink 6 to 8 glasses of water each day - set a realistic goal  Move as tolerated and rest when needed for exercise and stress         SDOH assessments and interventions completed:  No     Care Coordination Interventions:  Yes, provided   Interventions Today    Flowsheet Row Most Recent Value  Chronic Disease   Chronic disease during today's visit Other  [ILD Weight Loss]  General Interventions   General Interventions Discussed/Reviewed General Interventions Discussed  Exercise Interventions   Exercise Discussed/Reviewed Exercise Discussed  Pharmacy Interventions   Pharmacy Dicussed/Reviewed Pharmacy Topics Discussed        Follow up plan: Follow up call scheduled for 08/15/22  11 am    Encounter Outcome:  Pt. Visit Completed   Juanell Fairly RN, BSN, [facility] Care Coordinator [facility]   Phone: [phone]

## 2022-07-23 ENCOUNTER — Encounter: Payer: Self-pay | Admitting: Family Medicine

## 2022-07-24 MED ORDER — CLONAZEPAM 0.5 MG PO TABS: ORAL_TABLET | ORAL | 1 refills | Status: DC

## 2022-07-26 ENCOUNTER — Other Ambulatory Visit: Payer: Self-pay | Admitting: Family Medicine

## 2022-08-13 ENCOUNTER — Encounter: Payer: Self-pay | Admitting: Pulmonary Disease

## 2022-08-15 ENCOUNTER — Ambulatory Visit: Payer: BC Managed Care – PPO

## 2022-08-15 NOTE — Patient Outreach (Signed)
  Care Coordination   Follow Up Visit Note   08/15/2022 Name: Frances Medina MRN: [id number] DOB: 1960/09/18  Frances Medina is a 62 y.o. year old female who sees Doreene Eland, MD for primary care. I spoke with  Frances Medina by phone today.  What matters to the patients health and wellness today?  [name] is still working on losing weight. She does feel that the metformin is helping, and she has lost some weight. However, her breathing has not been good. She is using Advair, but the insurance will only cover one hundred dollars, so she will have to pay 200 dollars. This financial burden could potentially affect her ability to afford other necessary medications or treatments. I have reached out to [facility] to see if there is a program to help with payment.     Goals Addressed               This Visit's Progress     I want to continue on my weight loss journey and monitor my breathing (pt-stated)        Patient Goals/Self Care Activities: -Patient/Caregiver will self-administer medications as prescribed as evidenced by self-report/primary caregiver report  -Patient/Caregiver will attend all scheduled provider appointments as evidenced by clinician review of documented attendance to scheduled appointments and patient/caregiver report -Patient/Caregiver will call pharmacy for medication refills as evidenced by patient report and review of pharmacy fill history as appropriate -Patient/Caregiver will call provider office for new concerns or questions as evidenced by review of documented incoming telephone call notes and patient report -Patient/Caregiver verbalizes understanding of plan  -Avoid smoke and air pollution -Keep your airway clear from mucus build up  -Use a humidifier, if needed -Utilize infection prevention strategies to reduce risk of respiratory infection   - stock up on healthy food choices - track current amount and type of exercise - track feelings, emotional and  physical, when eating - drink 6 to 8 glasses of water each day - set a realistic goal  -Move as tolerated and rest when needed for exercise and stress -Stay in the cool when you can for your breathing         SDOH assessments and interventions completed:  No     Care Coordination Interventions:  Yes, provided   Interventions Today    Flowsheet Row Most Recent Value  Chronic Disease   Chronic disease during today's visit Other  [ILD]  General Interventions   General Interventions Discussed/Reviewed General Interventions Discussed, General Interventions Reviewed  Education Interventions   Education Provided Provided Education  Nutrition Interventions   Nutrition Discussed/Reviewed Nutrition Discussed  Pharmacy Interventions   Pharmacy Dicussed/Reviewed Pharmacy Topics Discussed  Safety Interventions   Safety Discussed/Reviewed Safety Discussed        Follow up plan: Follow up call scheduled for 10/15/22    1130 am     Encounter Outcome:  Pt. Visit Completed   Juanell Fairly RN, BSN, [facility] Care Coordinator [facility]   Phone: [phone]

## 2022-08-15 NOTE — Patient Instructions (Signed)
Visit Information  Thank you for taking time to visit with me today. Please don't hesitate to contact me if I can be of assistance to you.   Following are the goals we discussed today:   Goals Addressed               This Visit's Progress     I want to continue on my weight loss journey and monitor my breathing (pt-stated)        Patient Goals/Self Care Activities: -Patient/Caregiver will self-administer medications as prescribed as evidenced by self-report/primary caregiver report  -Patient/Caregiver will attend all scheduled provider appointments as evidenced by clinician review of documented attendance to scheduled appointments and patient/caregiver report -Patient/Caregiver will call pharmacy for medication refills as evidenced by patient report and review of pharmacy fill history as appropriate -Patient/Caregiver will call provider office for new concerns or questions as evidenced by review of documented incoming telephone call notes and patient report -Patient/Caregiver verbalizes understanding of plan  -Avoid smoke and air pollution -Keep your airway clear from mucus build up  -Use a humidifier, if needed -Utilize infection prevention strategies to reduce risk of respiratory infection   - stock up on healthy food choices - track current amount and type of exercise - track feelings, emotional and physical, when eating - drink 6 to 8 glasses of water each day - set a realistic goal  -Move as tolerated and rest when needed for exercise and stress -Stay in the cool when you can for your breathing         Our next appointment is by telephone on 10/15/22 at 1130 am  Please call the care guide team at [phone] if you need to cancel or reschedule your appointment.   If you are experiencing a Mental Health or Behavioral Health Crisis or need someone to talk to, please call 1-800-273-TALK (toll free, 24 hour hotline)  Patient verbalizes understanding of instructions and  care plan provided today and agrees to view in MyChart. Active MyChart status and patient understanding of how to access instructions and care plan via MyChart confirmed with patient.     Juanell Fairly RN, BSN, [facility] Care Coordinator [facility]   Phone: [phone]

## 2022-08-15 NOTE — Telephone Encounter (Signed)
We received this message in triage from a Dr. Lanora Manis patient.  Can you assist in letting him know what inhalers are approved?  Message from patient-  GOOD AFTERNOON, DR. Francine Graven I NEED TO SEE IF THERE IS A GENERIC FOR THE ADVAIR INHALER . BECAUSE THE REGULAR ADVAIR INHALER MY INSURANCE WON'T PAY FOR . THANKS IN ADVANCE MRS. Dieterich 6/12 24

## 2022-08-18 ENCOUNTER — Other Ambulatory Visit (HOSPITAL_COMMUNITY): Payer: Self-pay

## 2022-08-18 NOTE — Telephone Encounter (Signed)
Generic Advair HFA is showing covered for a co-pay of $192.53. Test claim is showing that patient does still have a deductible to meet at this time. Generic Advair Diskus ([place]) is showing a $16.00 co-pay at this time.

## 2022-08-22 MED ORDER — FLUTICASONE-SALMETEROL 250-50 MCG/ACT IN AEPB: 1.0000 | INHALATION_SPRAY | Freq: Two times a day (BID) | RESPIRATORY_TRACT | 5 refills | Status: DC

## 2022-08-24 ENCOUNTER — Other Ambulatory Visit: Payer: Self-pay | Admitting: Family Medicine

## 2022-09-01 ENCOUNTER — Other Ambulatory Visit: Payer: Self-pay | Admitting: Family Medicine

## 2022-09-01 DIAGNOSIS — Z1231 Encounter for screening mammogram for malignant neoplasm of breast: Secondary | ICD-10-CM

## 2022-09-02 ENCOUNTER — Ambulatory Visit
Admission: RE | Admit: 2022-09-02 | Discharge: 2022-09-02 | Disposition: A | Discharge status: Home / Self Care | Payer: BC Managed Care – PPO | Source: Ambulatory Visit | Attending: Family Medicine | Admitting: Family Medicine

## 2022-09-02 DIAGNOSIS — Z1231 Encounter for screening mammogram for malignant neoplasm of breast: Secondary | ICD-10-CM

## 2022-09-17 ENCOUNTER — Other Ambulatory Visit: Payer: Self-pay | Admitting: Family Medicine

## 2022-09-17 MED ORDER — CLONAZEPAM 0.5 MG PO TABS: ORAL_TABLET | ORAL | 1 refills | Status: DC

## 2022-09-24 DIAGNOSIS — Z713 Dietary counseling and surveillance: Secondary | ICD-10-CM | POA: Diagnosis not present

## 2022-09-24 DIAGNOSIS — R7303 Prediabetes: Secondary | ICD-10-CM | POA: Diagnosis not present

## 2022-09-24 DIAGNOSIS — Z6841 Body Mass Index (BMI) 40.0 and over, adult: Secondary | ICD-10-CM | POA: Diagnosis not present

## 2022-09-24 DIAGNOSIS — I10 Essential (primary) hypertension: Secondary | ICD-10-CM | POA: Diagnosis not present

## 2022-09-27 ENCOUNTER — Encounter: Payer: Self-pay | Admitting: Family Medicine

## 2022-09-29 ENCOUNTER — Other Ambulatory Visit: Payer: Self-pay | Admitting: Family Medicine

## 2022-09-29 MED ORDER — OMEPRAZOLE 20 MG PO CPDR: DELAYED_RELEASE_CAPSULE | ORAL | 1 refills | Status: DC

## 2022-10-01 ENCOUNTER — Ambulatory Visit (INDEPENDENT_AMBULATORY_CARE_PROVIDER_SITE_OTHER): Payer: BC Managed Care – PPO | Admitting: Family Medicine

## 2022-10-01 ENCOUNTER — Encounter: Payer: Self-pay | Admitting: Family Medicine

## 2022-10-01 VITALS — BP 120/66 | HR 95 | Temp 98.8°F | Ht 60.0 in | Wt 293.0 lb

## 2022-10-01 DIAGNOSIS — R051 Acute cough: Secondary | ICD-10-CM | POA: Diagnosis not present

## 2022-10-01 LAB — POC SOFIA SARS ANTIGEN FIA: SARS Coronavirus 2 Ag: NEGATIVE

## 2022-10-01 MED ORDER — SERTRALINE HCL 50 MG PO TABS: 150.0000 mg | ORAL_TABLET | Freq: Every day | ORAL | 1 refills | Status: DC

## 2022-10-01 NOTE — Progress Notes (Signed)
    SUBJECTIVE:   CHIEF COMPLAINT / HPI:   COUGH - h/o restrictive lung dz, OSA on CPAP, GERD, Myasthenia gravis (on immunosuppression) - possible COVID exposure at church - headache, slight dizziness, body aches. - compliant with CPAP  Duration: 1 day Cough description: hacking, nonproductive. Able to produce clear sputum with inhaler Aggravating factors:  nothing Alleviating factors: robatussin Status:  worse Treatments attempted: robatussin, inhaler, tylenol Wheezing: yes Shortness of breath: yes Chest pain: no Chest tightness:no Nasal congestion: yes Runny nose: yes Hemoptysis: no Fevers: no Heartburn: yes   OBJECTIVE:   BP 120/66   Pulse 95   Ht 5' (1.524 m)   Wt 293 lb (132.9 kg)   SpO2 98%   BMI 57.22 kg/m   Gen: well appearing, in NAD Card: RRR Lungs: CTAB. No wheezes, crackes, rhonchi Ext: WWP   ASSESSMENT/PLAN:   Cough Acute, 1 day duration. Likely 2/2 viral URI given symptoms and duration. COVID negative. Lung exam normal, less concern for superimposed pneumonia, lung disease exacerbation. Previous ECHO normal and appears euvolemic today. Encouraged symptomatic care. Strict return precautions discussed given MG and immunocompromised state.    Caro Laroche, DO

## 2022-10-01 NOTE — Patient Instructions (Signed)
You have a cold and it should start to get better about 7 - 10 days after it started.    For your cough, try robatussin, tessalon perles.  For your nasal congestion and runny nose, try using Afrin (generic is Oxymetazoline) twice daily for 3 days.  Do not use for longer that 3 days.    Some other therapies you can try are: push fluids, rest, use vaporizer or mist as needed, apply heat to sinuses as needed, and return office visit as needed if symptoms persist or worsen.   Drinking warm liquids such as teas and soups can help with secretions and cough. A mist humidifier or vaporizer can work well to help with secretions and cough.  It is very important to clean the humidifier between use according to the instructions.    It was good to see you.  If you're still having trouble in the next week, come back and see [place].    Of course, if you start having trouble breathing, worsening fevers, vomiting and unable to hold down any fluids, or you have other concerns, don't hesitate to come back or go to the ED after hours.

## 2022-10-06 MED ORDER — AMOXICILLIN 500 MG PO CAPS: 500.0000 mg | ORAL_CAPSULE | Freq: Three times a day (TID) | ORAL | 0 refills | Status: AC

## 2022-10-15 ENCOUNTER — Ambulatory Visit: Payer: Self-pay

## 2022-10-15 NOTE — Patient Instructions (Signed)
Visit Information  Thank you for taking time to visit with me today. Please don't hesitate to contact me if I can be of assistance to you.   Following are the goals we discussed today:   Goals Addressed               This Visit's Progress     I want to continue on my weight loss journey and monitor my breathing (pt-stated)        Patient Goals/Self Care Activities: -Patient/Caregiver will self-administer medications as prescribed as evidenced by self-report/primary caregiver report  -Patient/Caregiver will attend all scheduled provider appointments as evidenced by clinician review of documented attendance to scheduled appointments and patient/caregiver report --Patient/Caregiver will call provider office for new concerns or questions as evidenced by review of documented incoming telephone call notes and patient report -Avoid smoke and air pollution -Keep your airway clear from mucus build up  -Use a humidifier, if needed -Utilize infection prevention strategies to reduce risk of respiratory infection   - stock up on healthy food choices - track current amount and type of exercise - track feelings, emotional and physical, when eating - drink 6 to 8 glasses of water each day - set a realistic goal  -Move as tolerated and rest when needed for exercise and stress -Stay in the cool when you can for your breathing         Our next appointment is by telephone on 11/06/22 at 130 pm  Please call the care guide team at [phone] if you need to cancel or reschedule your appointment.   If you are experiencing a Mental Health or Behavioral Health Crisis or need someone to talk to, please call 1-800-273-TALK (toll free, 24 hour hotline)  Patient verbalizes understanding of instructions and care plan provided today and agrees to view in MyChart. Active MyChart status and patient understanding of how to access instructions and care plan via MyChart confirmed with patient.     Frances Fairly  RN, BSN, [facility] Care Coordinator [facility]   Phone: [phone]

## 2022-10-15 NOTE — Patient Outreach (Signed)
  Care Coordination   Follow Up Visit Note   10/15/2022 Name: TIMIKO GAU MRN: [id number] DOB: [birth year]  [name] is a 62 y.o. year old female who sees Doreene Eland, MD for primary care. I spoke with  [name] by phone today.  What matters to the patients health and wellness today?  Mrs. Kocur is currently experiencing hives and gastrointestinal issues. Despite her dietary choices, she experiences rapid digestion. While her acquaintances attribute these symptoms to her nerves, Mrs. Boney remains uncertain. Additionally, her mother-in-law is currently under Hospice in her home, where she receives exceptional support, providing solace and reassurance by Hospice. Mrs. Carandang, however, struggles to deal with the issue of her dying, which causes notable discomfort. Furthermore, she reports pain localized beneath her right breast that comes and goes. Her foremost concerns revolve around persistent hives and gastrointestinal issues.     Goals Addressed               This Visit's Progress     I want to continue on my weight loss journey and monitor my breathing (pt-stated)        Patient Goals/Self Care Activities: -Patient/Caregiver will self-administer medications as prescribed as evidenced by self-report/primary caregiver report  -Patient/Caregiver will attend all scheduled provider appointments as evidenced by clinician review of documented attendance to scheduled appointments and patient/caregiver report --Patient/Caregiver will call provider office for new concerns or questions as evidenced by review of documented incoming telephone call notes and patient report -Avoid smoke and air pollution -Keep your airway clear from mucus build up  -Use a humidifier, if needed -Utilize infection prevention strategies to reduce risk of respiratory infection   - stock up on healthy food choices - track current amount and type of exercise - track feelings, emotional and physical,  when eating - drink 6 to 8 glasses of water each day - set a realistic goal  -Move as tolerated and rest when needed for exercise and stress -Stay in the cool when you can for your breathing         SDOH assessments and interventions completed:  No     Care Coordination Interventions:  Yes, provided   Interventions Today    Flowsheet Row Most Recent Value  Chronic Disease   Chronic disease during today's visit Other  [Hives and gastrointestinal issues]  General Interventions   General Interventions Discussed/Reviewed General Interventions Discussed, General Interventions Reviewed  [Continue with inhalers and zyrtec prescribed]  Nutrition Interventions   Nutrition Discussed/Reviewed Nutrition Discussed, Nutrition Reviewed, Fluid intake  Pharmacy Interventions   Pharmacy Dicussed/Reviewed Pharmacy Topics Discussed  Safety Interventions   Safety Discussed/Reviewed Safety Reviewed        Follow up plan: Follow up call scheduled for 11/06/22  130 pm    Encounter Outcome:  Pt. Visit Completed   Juanell Fairly RN, BSN, [facility] Care Coordinator [facility]   Phone: [phone]

## 2022-11-06 ENCOUNTER — Ambulatory Visit: Payer: Self-pay

## 2022-11-06 ENCOUNTER — Other Ambulatory Visit: Payer: Self-pay | Admitting: Family Medicine

## 2022-11-06 NOTE — Patient Instructions (Signed)
Visit Information  Thank you for taking time to visit with me today. Please don't hesitate to contact me if I can be of assistance to you.   Following are the goals we discussed today:   Goals Addressed               This Visit's Progress     I want to continue on my weight loss journey and monitor my breathing (pt-stated)        Patient Goals/Self Care Activities: -Patient/Caregiver will take medications as prescribed   -Patient/Caregiver will attend all scheduled provider appointments -Patient/Caregiver will call pharmacy for medication refills 3-7 days in advance of running out of medications -Patient/Caregiver will call provider office for new concerns or questions  -Patient/Caregiver will focus on medication adherence by taking medications as prescribed   -Avoid smoke and air pollution -Keep your airway clear from mucus build up  -Use a humidifier, if needed -Utilize infection prevention strategies to reduce risk of respiratory infection   - stock up on healthy food choices -  track feelings, emotional and physical, when eating - drink 6 to 8 glasses of water each day --Move as tolerated and rest when needed for exercise and stress -Stay in the cool when you can for your breathing         Our next appointment is by telephone on 01/09/23 at 2 pm  Please call the care guide team at [phone] if you need to cancel or reschedule your appointment.   If you are experiencing a Mental Health or Behavioral Health Crisis or need someone to talk to, please call 1-800-273-TALK (toll free, 24 hour hotline)  Patient verbalizes understanding of instructions and care plan provided today and agrees to view in MyChart. Active MyChart status and patient understanding of how to access instructions and care plan via MyChart confirmed with patient.     Juanell Fairly RN, BSN, [facility] [employer] [employer] Phone: [phone]

## 2022-11-06 NOTE — Patient Outreach (Signed)
  Care Coordination   Follow Up Visit Note   11/06/2022 Name: Frances Medina MRN: [id number] DOB: 1961/01/25  Frances Medina is a 62 y.o. year old female who sees Doreene Eland, MD for primary care. I spoke with  Frances Medina by phone today.  What matters to the patients health and wellness today?  Frances Medina's current status indicates that she is managing well, although she continues to experience a persistent rash for which she is receiving medication. Her respiratory condition remains unchanged. She experiences breathlessness when walking short distances, necessitating using an electric wheelchair for longer distances. Additionally, she relies on an inhaler for respiratory support.    Goals Addressed               This Visit's Progress     I want to continue on my weight loss journey and monitor my breathing (pt-stated)        Patient Goals/Self Care Activities: -Patient/Caregiver will take medications as prescribed   -Patient/Caregiver will attend all scheduled provider appointments -Patient/Caregiver will call pharmacy for medication refills 3-7 days in advance of running out of medications -Patient/Caregiver will call provider office for new concerns or questions  -Patient/Caregiver will focus on medication adherence by taking medications as prescribed   -Avoid smoke and air pollution -Keep your airway clear from mucus build up  -Use a humidifier, if needed -Utilize infection prevention strategies to reduce risk of respiratory infection   - stock up on healthy food choices -  track feelings, emotional and physical, when eating - drink 6 to 8 glasses of water each day --Move as tolerated and rest when needed for exercise and stress -Stay in the cool when you can for your breathing         SDOH assessments and interventions completed:  No     Care Coordination Interventions:  Yes, provided   Interventions Today    Flowsheet Row Most Recent Value  Chronic  Disease   Chronic disease during today's visit Other  [ILD]  General Interventions   General Interventions Discussed/Reviewed General Interventions Discussed  [Use inhales as needed and electric wheelchair]  Nutrition Interventions   Nutrition Discussed/Reviewed Nutrition Discussed  Pharmacy Interventions   Pharmacy Dicussed/Reviewed Pharmacy Topics Discussed  Safety Interventions   Safety Discussed/Reviewed Safety Discussed        Follow up plan:  01/09/23  2 pm    Encounter Outcome:  Patient Visit Completed   Juanell Fairly RN, BSN, [facility]   Care Coordinator Phone: [phone]

## 2022-11-07 ENCOUNTER — Other Ambulatory Visit: Payer: Self-pay | Admitting: Family Medicine

## 2022-11-18 ENCOUNTER — Ambulatory Visit (INDEPENDENT_AMBULATORY_CARE_PROVIDER_SITE_OTHER): Payer: BC Managed Care – PPO | Admitting: Podiatry

## 2022-11-18 DIAGNOSIS — M7662 Achilles tendinitis, left leg: Secondary | ICD-10-CM

## 2022-11-18 MED ORDER — MELOXICAM 15 MG PO TABS: 15.0000 mg | ORAL_TABLET | Freq: Every day | ORAL | 0 refills | Status: DC

## 2022-11-18 NOTE — Patient Instructions (Signed)

## 2022-11-18 NOTE — Progress Notes (Signed)
Subjective:  Patient ID: Frances Medina, female    DOB: [birth year],   MRN: [id number]  Chief Complaint  Patient presents with   Ankle Pain    left    62 y.o. female presents for concern of left ankle pain that has been ongoing for a while. Relates hurts when going out in certain shoes. Hurts mostly at the back of the ankle. Has tried tylenol which helps a little. Has tried different shoes and sometimes worse in slides.   . Denies any other pedal complaints. Denies n/v/f/c.   Past Medical History:  Diagnosis Date   Acute respiratory failure (HCC)    Acute respiratory failure with hypoxemia (HCC)    Aspiration into airway    Back pain 07/31/2017   Benign neoplasm of rectum    CAP (community acquired pneumonia) 08/05/2015   Depression with anxiety    Diverticulosis    Dysphagia 03/2015    EGD, Dr Leone Payor. mild antral gastritis, ? due to Ibuprofen.  no stricture but empirically maloney dilated esophagus.    Dysphagia, neurologic    E. 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   GERD (gastroesophageal reflux disease)    hiatal hernia   Hypertension    "went away when I stopped smoking"   Knee injury    Left hip pain 02/18/2017   Migraine    "maybe couple times/month" (03/20/2015)   Mild intermittent asthma 06/22/2017   Myasthenia gravis (HCC) 2017   Myasthenia gravis with acute exacerbation (HCC) 05/15/2015   Osteoarthritis of left knee 12/02/2013   Osteoarthritis of right knee 08/30/2013   Pancreatitis 07/25/2017   Protein-calorie malnutrition, severe (HCC) 08/07/2015   Seasonal allergies    takes Zytrec   Tracheostomy status (HCC)    Tubular adenoma of colon    Tumors    "in my stomach"   Umbilical  hernia    watching , no plans for surgery at present   Urine incontinence 10/19/2015   VAP (ventilator-associated pneumonia) (HCC)     Objective:  Physical Exam: Vascular: DP/PT pulses 2/4 bilateral. CFT <3 seconds. Normal hair growth on digits. No edema.  Skin. No lacerations or abrasions bilateral feet.  Musculoskeletal: MMT 5/5 bilateral lower extremities in DF, PF, Inversion and Eversion. Deceased ROM in DF of ankle joint. Tender to insertion of left achilles. Some pain with DF and PF. No pain along peroneals or PT tendon.  Neurological: Sensation intact to light touch.   Assessment:   1. Tendonitis, Achilles, left      Plan:  Patient was evaluated and treated and all questions answered. -Discussed Achilles insertional tendonitis and treatment options with patient.  -Discussed stretching exercises. -Rx Meloxicam provided  -Heel lifts provided and discussed proper shoewear.  -Discussed if no improvement will consider MRI/PT/EPAT/PRP injections.  -Patient to return to office as needed or sooner if condition worsens.   Louann Sjogren, DPM

## 2022-11-28 ENCOUNTER — Ambulatory Visit: Payer: BC Managed Care – PPO | Attending: Nurse Practitioner | Admitting: Nurse Practitioner

## 2022-11-28 ENCOUNTER — Encounter: Payer: Self-pay | Admitting: Nurse Practitioner

## 2022-11-28 VITALS — BP 132/80 | HR 76 | Ht 60.0 in | Wt 298.2 lb

## 2022-11-28 DIAGNOSIS — I10 Essential (primary) hypertension: Secondary | ICD-10-CM | POA: Diagnosis not present

## 2022-11-28 DIAGNOSIS — R0602 Shortness of breath: Secondary | ICD-10-CM

## 2022-11-28 DIAGNOSIS — R079 Chest pain, unspecified: Secondary | ICD-10-CM | POA: Diagnosis not present

## 2022-11-28 DIAGNOSIS — G4733 Obstructive sleep apnea (adult) (pediatric): Secondary | ICD-10-CM | POA: Diagnosis not present

## 2022-11-28 NOTE — Patient Instructions (Signed)
Medication Instructions:  Your physician recommends that you continue on your current medications as directed. Please refer to the Current Medication list given to you today.  *If you need a refill on your cardiac medications before your next appointment, please call your pharmacy*   Lab Work: NONE ordered at this time of appointment     Testing/Procedures: NONE ordered at this time of appointment     Follow-Up: At [facility], you and your health needs are our priority.  As part of our continuing mission to provide you with exceptional heart care, we have created designated Provider Care Teams.  These Care Teams include your primary Cardiologist (physician) and Advanced Practice Providers (APPs -  Physician Assistants and Nurse Practitioners) who all work together to provide you with the care you need, when you need it.  We recommend signing up for the patient portal called "MyChart".  Sign up information is provided on this After Visit Summary.  MyChart is used to connect with patients for Virtual Visits (Telemedicine).  Patients are able to view lab/test results, encounter notes, upcoming appointments, etc.  Non-urgent messages can be sent to your provider as well.   To learn more about what you can do with MyChart, go to [email or web address].    Your next appointment:   1 year(s)  Provider:   Parke Poisson, MD

## 2022-11-28 NOTE — Progress Notes (Signed)
Office Visit    Patient Name: Frances Medina Date of Encounter: 11/28/2022  Primary Care Provider:  Doreene Eland, MD Primary Cardiologist:  Parke Poisson, MD  Chief Complaint    62 year old female with a history of atypical chest pain, hypertension, myasthenia gravis, OSA, and obesity who presents for follow-up related to chest pain and hypertension.   Past Medical History    Past Medical History:  Diagnosis Date   Acute respiratory failure (HCC)    Acute respiratory failure with hypoxemia (HCC)    Aspiration into airway    Back pain 07/31/2017   Benign neoplasm of rectum    CAP (community acquired pneumonia) 08/05/2015   Depression with anxiety    Diverticulosis    Dysphagia 03/2015    EGD, Dr Leone Payor. mild antral gastritis, ? due to Ibuprofen.  no stricture but empirically maloney dilated esophagus.    Dysphagia, neurologic    E. 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   GERD (gastroesophageal reflux disease)    hiatal hernia   Hypertension    "went away when I stopped smoking"   Knee injury    Left hip pain 02/18/2017   Migraine    "maybe couple times/month" (03/20/2015)   Mild intermittent asthma 06/22/2017   Myasthenia gravis (HCC) 2017   Myasthenia gravis with acute exacerbation (HCC) 05/15/2015   Osteoarthritis of left knee 12/02/2013   Osteoarthritis of right knee 08/30/2013   Pancreatitis 07/25/2017   Protein-calorie malnutrition, severe (HCC) 08/07/2015   Seasonal allergies    takes Zytrec   Tracheostomy status (HCC)    Tubular adenoma of colon    Tumors    "in my stomach"   Umbilical hernia    watching , no plans for surgery at present   Urine incontinence 10/19/2015   VAP  (ventilator-associated pneumonia) [facility])    Past Surgical History:  Procedure Laterality Date   BIOPSY  07/05/2019   Procedure: BIOPSY;  Surgeon: Meryl Dare, MD;  Location: [facility] ENDOSCOPY;  Service: Endoscopy;;   CESAREAN SECTION  1987; 1989   COLONOSCOPY WITH PROPOFOL N/A 09/19/2013   Procedure: COLONOSCOPY WITH PROPOFOL;  Surgeon: Meryl Dare, MD;  Location: [facility];  Service: Endoscopy;  Laterality: N/A;   COLONOSCOPY WITH PROPOFOL N/A 07/05/2019   Procedure: COLONOSCOPY WITH PROPOFOL;  Surgeon: Meryl Dare, MD;  Location: [facility];  Service: Endoscopy;  Laterality: N/A;   DILATION AND CURETTAGE OF UTERUS     ESOPHAGOGASTRODUODENOSCOPY N/A 08/10/2015   Procedure: ESOPHAGOGASTRODUODENOSCOPY (EGD);  Surgeon: Iva Boop, MD;  Location: [facility] ENDOSCOPY;  Service: Endoscopy;  Laterality: N/A;   ESOPHAGOGASTRODUODENOSCOPY (EGD) WITH PROPOFOL N/A 03/21/2015   Procedure: ESOPHAGOGASTRODUODENOSCOPY (EGD) WITH PROPOFOL;  Surgeon: Iva Boop, MD;  Location: [facility] ENDOSCOPY;  Service: Endoscopy;  Laterality: N/A;   ESOPHAGOGASTRODUODENOSCOPY (EGD) WITH PROPOFOL N/A 07/05/2019   Procedure: ESOPHAGOGASTRODUODENOSCOPY (EGD) WITH PROPOFOL;  Surgeon: Meryl Dare, MD;  Location: [facility];  Service: Endoscopy;  Laterality: N/A;   PARTIAL KNEE ARTHROPLASTY Right 08/30/2013   Procedure: RIGHT UNICOMPARTMENTAL KNEE;  Surgeon: Eulas Post, MD;  Location: [facility] OR;  Service: Orthopedics;  Laterality: Right;   PARTIAL KNEE ARTHROPLASTY Left 12/02/2013   Procedure: LEFT  KNEE UNI ARTHROPLASTY;  Surgeon: Eulas Post, MD;  Location: [facility];  Service: Orthopedics;  Laterality: Left;   PEG PLACEMENT N/A 08/10/2015   Procedure: PERCUTANEOUS ENDOSCOPIC GASTROSTOMY (PEG) PLACEMENT;  Surgeon: Iva Boop, MD;  Location: [facility] ENDOSCOPY;  Service: Endoscopy;  Laterality: N/A;   POLYPECTOMY  07/05/2019   Procedure: POLYPECTOMY;  Surgeon: Meryl Dare, MD;  Location: [facility] ENDOSCOPY;   Service: Endoscopy;;   TUBAL LIGATION  1989   VAGINAL HYSTERECTOMY  1990's?   "apparently took out one of my ovaries at the time too cause one's missing"    Allergies  Allergies  Allergen Reactions   Ciprofloxacin Shortness Of Breath and Rash   Naproxen Shortness Of Breath   Shrimp [Shellfish Allergy] Hives and Shortness Of Breath    ER required   Dicyclomine Hives   Methocarbamol Hives   Pineapple Itching    Itchy lips and tongue   Morphine And Codeine Other (See Comments)    Severe headache     Labs/Other Studies Reviewed    The following studies were reviewed today:  Cardiac Studies & Procedures     STRESS TESTS  MYOCARDIAL PERFUSION IMAGING 04/19/2019  Narrative  Nuclear stress EF: 66%.  The left ventricular ejection fraction is hyperdynamic (>65%).  There was no ST segment deviation noted during stress.  The study is normal.  This is a low risk study.  Defect 1: There is a medium defect of mild severity present in the mid anterior, apical anterior and apex location.  No prior study for comparison.  There is a medium sized defect of mild severity on stress imaging. It improves but does not fully resolve on upright imaging. While breast attenuation is possible, ischemia cannot be excluded on the basis of this study.   ECHOCARDIOGRAM  ECHOCARDIOGRAM COMPLETE 01/09/2022  Narrative ECHOCARDIOGRAM REPORT    Patient Name:   Frances Medina Date of Exam: 01/09/2022 Medical Rec #:  [id number]      Height:       60.0 in Accession #:    [id number]     Weight:       285.1 lb Date of Birth:  1960/06/20      BSA:          2.170 m Patient Age:    61 years       BP:           145/90 mmHg Patient Gender: F              HR:           73 bpm. Exam Location:  Outpatient  Procedure: 2D Echo, Color Doppler, Cardiac Doppler and Strain Analysis  Indications:    R06.00 Dyspnea  History:        Patient has prior history of Echocardiogram examinations, most recent  05/03/2020. Signs/Symptoms:Chronic cough; Risk Factors:Hypertension. DOE.  Sonographer:    Humphrey Rolls Referring Phys: [id number] Doreene Eland   Sonographer Comments: No subcostal window. Global longitudinal strain was attempted. IMPRESSIONS   1. Left ventricular ejection fraction, by estimation, is 65 to 70%. The left ventricle has normal function. The left ventricle has no regional wall motion abnormalities. Left ventricular diastolic parameters were normal. 2. Right ventricular systolic function is normal. The right ventricular size is normal. There is normal pulmonary artery systolic pressure. 3. The mitral valve is grossly normal. No evidence of mitral valve regurgitation. No evidence of mitral stenosis. 4. The aortic valve is tricuspid. Aortic valve  regurgitation is not visualized. No aortic stenosis is present. 5. The inferior vena cava is normal in size with greater than 50% respiratory variability, suggesting right atrial pressure of 3 mmHg.  Comparison(s): No significant change from prior study.  FINDINGS Left Ventricle: Left ventricular ejection fraction, by estimation, is 65 to 70%. The left ventricle has normal function. The left ventricle has no regional wall motion abnormalities. Global longitudinal strain performed but not reported based on interpreter judgement due to suboptimal tracking. The left ventricular internal cavity size was normal in size. There is no left ventricular hypertrophy. Left ventricular diastolic parameters were normal.  Right Ventricle: The right ventricular size is normal. No increase in right ventricular wall thickness. Right ventricular systolic function is normal. There is normal pulmonary artery systolic pressure. The tricuspid regurgitant velocity is 2.65 m/s, and with an assumed right atrial pressure of 3 mmHg, the estimated right ventricular systolic pressure is 31.1 mmHg.  Left Atrium: Left atrial size was normal in size.  Right Atrium: Right  atrial size was normal in size.  Pericardium: Trivial pericardial effusion is present. Presence of epicardial fat layer.  Mitral Valve: The mitral valve is grossly normal. No evidence of mitral valve regurgitation. No evidence of mitral valve stenosis.  Tricuspid Valve: The tricuspid valve is normal in structure. Tricuspid valve regurgitation is not demonstrated. No evidence of tricuspid stenosis.  Aortic Valve: The aortic valve is tricuspid. Aortic valve regurgitation is not visualized. No aortic stenosis is present. Aortic valve mean gradient measures 6.0 mmHg. Aortic valve peak gradient measures 13.7 mmHg. Aortic valve area, by VTI measures 1.77 cm.  Pulmonic Valve: The pulmonic valve was not well visualized. Pulmonic valve regurgitation is not visualized. No evidence of pulmonic stenosis.  Aorta: The aortic root is normal in size and structure.  Venous: The inferior vena cava is normal in size with greater than 50% respiratory variability, suggesting right atrial pressure of 3 mmHg.  IAS/Shunts: No atrial level shunt detected by color flow Doppler.   LEFT VENTRICLE PLAX 2D LVIDd:         3.90 cm   Diastology LVIDs:         2.30 cm   LV e' medial:    13.20 cm/s LV PW:         1.00 cm   LV E/e' medial:  8.9 LV IVS:        1.30 cm   LV e' lateral:   10.70 cm/s LVOT diam:     1.70 cm   LV E/e' lateral: 11.0 LV SV:         63 LV SV Index:   29 LVOT Area:     2.27 cm   RIGHT VENTRICLE RV Basal diam:  3.10 cm RV S prime:     14.60 cm/s TAPSE (M-mode): 3.6 cm  LEFT ATRIUM             Index LA diam:        4.80 cm 2.21 cm/m LA Vol (A2C):   29.4 ml 13.55 ml/m LA Vol (A4C):   40.2 ml 18.52 ml/m LA Biplane Vol: 36.7 ml 16.91 ml/m AORTIC VALVE                     PULMONIC VALVE AV Area (Vmax):    1.67 cm      PV Vmax:       1.11 m/s AV Area (Vmean):   1.61 cm      PV Vmean:  77.000 cm/s AV Area (VTI):     1.77 cm      PV VTI:        0.239 m AV Vmax:           185.00  cm/s   PV Peak grad:  4.9 mmHg AV Vmean:          115.000 cm/s  PV Mean grad:  3.0 mmHg AV VTI:            0.357 m AV Peak Grad:      13.7 mmHg AV Mean Grad:      6.0 mmHg LVOT Vmax:         136.00 cm/s LVOT Vmean:        81.700 cm/s LVOT VTI:          0.279 m LVOT/AV VTI ratio: 0.78  AORTA Ao Root diam: 2.60 cm  MITRAL VALVE                TRICUSPID VALVE MV Area (PHT): 5.27 cm     TR Peak grad:   28.1 mmHg MV Decel Time: 144 msec     TR Vmax:        265.00 cm/s MV E velocity: 117.40 cm/s MV A velocity: 78.30 cm/s   SHUNTS MV E/A ratio:  1.50         Systemic VTI:  0.28 m Systemic Diam: 1.70 cm  Riley Lam MD Electronically signed by Riley Lam MD Signature Date/Time: 01/09/2022/2:36:18 PM    Final            Recent Labs: No results found for requested labs within last 365 days.  Recent Lipid Panel    Component Value Date/Time   CHOL 190 12/03/2021 1040   TRIG 91 12/03/2021 1040   HDL 48 12/03/2021 1040   CHOLHDL 4.0 12/03/2021 1040   CHOLHDL 2.7 07/25/2017 1846   VLDL 9 07/25/2017 1846   LDLCALC 125 (H) 12/03/2021 1040    History of Present Illness    62 year old female with the above past medical history including atypical chest pain, hypertension, myasthenia gravis, OSA, and obesity.  She was initially evaluated by cardiology in the setting of epigastric pain which resolved after stopping ibuprofen. Stress test in 04/2019 was low risk.  Abnormality noted on stress test was likely radiated to artifact given body habitus.  She does have mild dyspnea on exertion and evidence of restrictive lung disease by PFTs, likely secondary to obesity.  She was last seen in the office on 11/26/2021 and was doing well from a cardiac standpoint.  She denied symptoms concerning for angina.  Echocardiogram in 01/2022 showed EF 65 to 70%, normal LV function, no RWMA, normal RV, no significant valvular abnormalities.  She presents today for follow-up.  Since her last  visit she has done well from a cardiac standpoint.  She denies any chest pain, palpitations, dizziness, edema, PND, orthopnea.  She does have stable chronic dyspnea.  She notes that she is no longer able to afford [facility] and unfortunately has gained back the weight that she had lost previously.  Otherwise, she reports feeling well.  Home Medications    Current Outpatient Medications  Medication Sig Dispense Refill   acetaminophen (TYLENOL) 500 MG tablet Take 500 mg by mouth in the morning and at bedtime.     albuterol (PROVENTIL) (2.5 MG/3ML) 0.083% nebulizer solution USE 1 VIAL IN NEBULIZER EVERY 6 HOURS AS NEEDED FOR SHORTNESS OF BREATH AND WHEEZING 150 mL 0  albuterol (VENTOLIN HFA) 108 (90 Base) MCG/ACT inhaler INHALE 2 PUFFS INTO THE LUNGS EVERY 6 HOURS AS NEEDED FOR WHEEZE OR SHORTNESS OF BREATH 18 g 3   APPLE CIDER VINEGAR PO Take 1 capsule by mouth daily.     Ascorbic Acid (VITAMIN C PO) Take 1,000 mg by mouth daily.     baclofen (LIORESAL) 10 MG tablet TAKE 1 TABLET BY MOUTH AT BEDTIME AS NEEDED FOR MUSCLE SPASMS. 90 tablet 1   benzonatate (TESSALON) 100 MG capsule TAKE 1 CAPSULE BY MOUTH TWICE A DAY AS NEEDED FOR COUGH 20 capsule 1   cetirizine (ZYRTEC) 10 MG tablet Take 1 tablet (10 mg total) by mouth 2 (two) times daily as needed for allergies (hives). 60 tablet 5   clonazePAM (KLONOPIN) 0.5 MG tablet TAKE 1 TABLET BY MOUTH TWICE A DAY AS NEEDED FOR ANXIETY 60 tablet 1   Cyanocobalamin (VITAMIN B12 PO) Take 1 tablet by mouth daily.     famotidine (PEPCID) 20 MG tablet Take 1 tablet (20 mg total) by mouth 2 (two) times daily as needed (hives). 60 tablet 5   fluticasone (FLONASE) 50 MCG/ACT nasal spray Place 2 sprays into both nostrils daily. 16 g 6   fluticasone-salmeterol (WIXELA INHUB) 250-50 MCG/ACT AEPB Inhale 1 puff into the lungs in the morning and at bedtime. 60 each 5   gabapentin (NEURONTIN) 300 MG capsule TAKE 1 CAPSULE BY MOUTH TWICE A DAY 60 capsule 1   meloxicam (MOBIC)  15 MG tablet Take 1 tablet (15 mg total) by mouth daily. 30 tablet 0   Menthol 10 MG LOZG Take 1 lozenge by mouth daily as needed (cough).     metFORMIN (GLUCOPHAGE-XR) 500 MG 24 hr tablet Take 500 mg by mouth.     montelukast (SINGULAIR) 10 MG tablet TAKE 1 TABLET BY MOUTH EVERYDAY AT BEDTIME 90 tablet 1   mycophenolate (CELLCEPT) 500 MG tablet Take 500 mg by mouth 2 (two) times daily.     nystatin (MYCOSTATIN/NYSTOP) powder APPLY TOPICALLY 3 TIMES DAILY AS NEEDED. 15 g 1   omeprazole (PRILOSEC) 20 MG capsule TAKE ONE TABLET DAILY AS NEEDED 90 capsule 1   phentermine (ADIPEX-P) 37.5 MG tablet Take 18.75 mg by mouth every morning.     Semaglutide-Weight Management 2.4 MG/0.75ML SOAJ Inject 2.4 mg as directed once a week.     sertraline (ZOLOFT) 50 MG tablet Take 3 tablets (150 mg total) by mouth daily. 270 tablet 1   VITAMIN D, CHOLECALCIFEROL, PO Take 1 tablet by mouth daily. otc     No current facility-administered medications for this visit.     Review of Systems    She denies chest pain, palpitations, pnd, orthopnea, n, v, dizziness, syncope, edema, weight gain, or early satiety. All other systems reviewed and are otherwise negative except as noted above.   Physical Exam    VS:  BP 132/80 (BP Location: Right Arm, Patient Position: Sitting, Cuff Size: Normal)   Pulse 76   Ht 5' (1.524 m)   Wt 298 lb 3.2 oz (135.3 kg)   BMI 58.24 kg/m  GEN: Well nourished, well developed, in no acute distress. HEENT: normal. Neck: Supple, no JVD, carotid bruits, or masses. Cardiac: RRR with audible ectopy, no murmurs, rubs, or gallops. No clubbing, cyanosis, edema.  Radials/DP/PT 2+ and equal bilaterally.  Respiratory:  Respirations regular and unlabored, clear to auscultation bilaterally. GI: Soft, nontender, nondistended, BS + x 4. MS: no deformity or atrophy. Skin: warm and dry, no rash. Neuro:  Strength and sensation are intact. Psych: Normal affect.  Accessory Clinical Findings    ECG  personally reviewed by me today - EKG Interpretation Date/Time:  Friday November 28 2022 08:01:40 EDT Ventricular Rate:  76 PR Interval:    QRS Duration:  70 QT Interval:  386 QTC Calculation: 434 R Axis:   37  Text Interpretation: Normal sinus rhythm Premature atrial complexes Confirmed by Bernadene Person ([id number]) on 11/28/2022 8:41:29 AM  - no acute changes.   Lab Results  Component Value Date   WBC 6.9 11/26/2021   HGB 11.2 11/26/2021   HCT 36.5 11/26/2021   MCV 69 (L) 11/26/2021   PLT 171 11/26/2021   Lab Results  Component Value Date   CREATININE 0.65 11/26/2021   BUN 12 11/26/2021   NA 141 11/26/2021   K 4.0 11/26/2021   CL 103 11/26/2021   CO2 25 11/26/2021   Lab Results  Component Value Date   ALT 14 05/02/2020   AST 18 05/02/2020   ALKPHOS 75 05/02/2020   BILITOT 0.5 05/02/2020   Lab Results  Component Value Date   CHOL 190 12/03/2021   HDL 48 12/03/2021   LDLCALC 125 (H) 12/03/2021   TRIG 91 12/03/2021   CHOLHDL 4.0 12/03/2021    Lab Results  Component Value Date   HGBA1C 5.4 03/11/2022    Assessment & Plan    1. Atypical chest pain/epigastric pain/dyspnea on exertion:  Stress test in 04/2019 was low risk. Echocardiogram in 01/2022 showed EF 65 to 70%, normal LV function, no RWMA, normal RV, no significant valvular abnormalities.  She has stable chronic dyspnea in the setting of generalized physical deconditioning, obesity, unchanged from prior visits.  Otherwise, stable with no anginal symptoms. No indication for ischemic evaluation.   2. PACs: EKG today shows sinus rhythm with PACs.  She is asymptomatic.  3. Hypertension: BP well controlled. Continue current antihypertensive regimen.   4. OSA: Adherent to CPAP.  5. Obesity: She had previously lost weight on Wegovy, but unfortunately her insurance stopped covering the medication.  She has gained back all of her weight that she lost as she is no longer able to take the medication due to cost.  6.  Disposition: Follow-up in 1 year, sooner if needed.      Joylene Grapes, NP 11/28/2022, 8:58 AM

## 2022-12-29 DIAGNOSIS — G7000 Myasthenia gravis without (acute) exacerbation: Secondary | ICD-10-CM | POA: Diagnosis not present

## 2022-12-30 ENCOUNTER — Ambulatory Visit: Payer: BC Managed Care – PPO

## 2022-12-30 ENCOUNTER — Telehealth: Payer: Self-pay

## 2022-12-30 ENCOUNTER — Encounter: Payer: Self-pay | Admitting: Podiatry

## 2022-12-30 ENCOUNTER — Ambulatory Visit (INDEPENDENT_AMBULATORY_CARE_PROVIDER_SITE_OTHER): Payer: BC Managed Care – PPO | Admitting: Podiatry

## 2022-12-30 ENCOUNTER — Ambulatory Visit (INDEPENDENT_AMBULATORY_CARE_PROVIDER_SITE_OTHER): Payer: BC Managed Care – PPO

## 2022-12-30 DIAGNOSIS — M778 Other enthesopathies, not elsewhere classified: Secondary | ICD-10-CM

## 2022-12-30 DIAGNOSIS — M7662 Achilles tendinitis, left leg: Secondary | ICD-10-CM

## 2022-12-30 NOTE — Progress Notes (Signed)
Subjective:  Patient ID: Frances Medina, female    DOB: May 18, 1960,   MRN: [id number]  Chief Complaint  Patient presents with   Foot Pain    RM#22 Left foot pain since 11/2022 has not gotten better.    62 y.o. female presents for follow-up of left achilles tendonitis. Relates not any better and has maybe even worsened. Has been stretching some. Meloxicam and heel lifts did not help. .   . Denies any other pedal complaints. Denies n/v/f/c.   Past Medical History:  Diagnosis Date   Acute respiratory failure (HCC)    Acute respiratory failure with hypoxemia (HCC)    Aspiration into airway    Back pain 07/31/2017   Benign neoplasm of rectum    CAP (community acquired pneumonia) 08/05/2015   Depression with anxiety    Diverticulosis    Dysphagia 03/2015    EGD, Dr Leone Payor. mild antral gastritis, ? due to Ibuprofen.  no stricture but empirically maloney dilated esophagus.    Dysphagia, neurologic    E. 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   GERD (gastroesophageal reflux disease)    hiatal hernia   Hypertension    "went away when I stopped smoking"   Knee injury    Left hip pain 02/18/2017   Migraine    "maybe couple times/month" (03/20/2015)   Mild intermittent asthma 06/22/2017   Myasthenia gravis (HCC) 2017   Myasthenia gravis with acute exacerbation (HCC) 05/15/2015   Osteoarthritis of left knee 12/02/2013   Osteoarthritis of right knee 08/30/2013   Pancreatitis 07/25/2017   Protein-calorie malnutrition, severe (HCC) 08/07/2015   Seasonal allergies    takes Zytrec   Tracheostomy status (HCC)    Tubular adenoma of colon    Tumors    "in my stomach"   Umbilical hernia    watching , no plans for  surgery at present   Urine incontinence 10/19/2015   VAP (ventilator-associated pneumonia) (HCC)     Objective:  Physical Exam: Vascular: DP/PT pulses 2/4 bilateral. CFT <3 seconds. Normal hair growth on digits. No edema.  Skin. No lacerations or abrasions bilateral feet.  Musculoskeletal: MMT 5/5 bilateral lower extremities in DF, PF, Inversion and Eversion. Deceased ROM in DF of ankle joint. Tender to insertion of left achilles. Some pain with DF and PF. No pain along peroneals or PT tendon.  Neurological: Sensation intact to light touch.   Assessment:   1. Tendonitis, Achilles, left   2. Capsulitis of foot      Plan:  Patient was evaluated and treated and all questions answered. -X-ray reviewed and noted spurring to posterior calcaneus  -Discussed Achilles insertional tendonitis and treatment options with patient.  -Continue stretching and anit-inflammatories.  -Amb ref to PT  -Discussed if no improvement will consider MRI/PT/EPAT/PRP injections.  -Patient to return to office in 2 months for recheck.    Louann Sjogren, DPM

## 2022-12-30 NOTE — Telephone Encounter (Signed)
Patient calls nurse line requesting adjustments to CPAP machine. She feels that machine needs to be "turned up a notch."  She was advised to reach out to provider regarding concern.   Patient is also established at [facility]. Advised that they would likely be the office to contact for adjustments, also unsure if she would need a new sleep study.   Patient asked that I send message to Dr. Lum Babe.   Veronda Prude, RN

## 2023-01-01 NOTE — Telephone Encounter (Signed)
Attempted to reach patient. No answer. LVM of note left by provider. Aquilla Solian, CMA

## 2023-01-08 DIAGNOSIS — Z6841 Body Mass Index (BMI) 40.0 and over, adult: Secondary | ICD-10-CM | POA: Diagnosis not present

## 2023-01-08 DIAGNOSIS — R7303 Prediabetes: Secondary | ICD-10-CM | POA: Diagnosis not present

## 2023-01-08 DIAGNOSIS — Z713 Dietary counseling and surveillance: Secondary | ICD-10-CM | POA: Diagnosis not present

## 2023-01-09 ENCOUNTER — Ambulatory Visit: Payer: Self-pay

## 2023-01-09 NOTE — Patient Outreach (Signed)
  Care Coordination   Follow Up Visit Note   01/09/2023 Name: Frances Medina MRN: [id number] DOB: 11/28/1960  Frances Medina is a 62 y.o. year old female who sees Doreene Eland, MD for primary care. I spoke with  Frances Medina by phone today.  What matters to the patients health and wellness today?  Frances Medina is doing well. She mentioned that she has regained some of the weight she lost but is still down by 10 pounds. Her breathing has been stable, and she has been consistently taking her allergy medications and using her inhaler. The rash has remained stable as well, and she is adhering to her prescribed medication regimen. I also informed her that I will no longer be working at the clinic, and her new case manager will be Danise Edge RN.     Goals Addressed               This Visit's Progress     I want to continue on my weight loss journey and monitor my breathing (pt-stated)        Patient Goals/Self Care Activities: -Patient/Caregiver will take medications as prescribed   -Patient/Caregiver will attend all scheduled provider appointments -Patient/Caregiver will call pharmacy for medication refills 3-7 days in advance of running out of medications -Patient/Caregiver will call provider office for new concerns or questions  -Patient/Caregiver will focus on medication adherence by taking medications as prescribed   -Avoid smoke and air pollution -Keep your airway clear from mucus build up  -Use a humidifier, if needed -Utilize infection prevention strategies to reduce risk of respiratory infection   - stock up on healthy food choices - drink 6 to 8 glasses of water each day -Stay in the cool when you can for your breathing         SDOH assessments and interventions completed:  No     Care Coordination Interventions:  Yes, provided   Interventions Today    Flowsheet Row Most Recent Value  Chronic Disease   Chronic disease during today's visit Other  [ILD  Weight Loss]  General Interventions   General Interventions Discussed/Reviewed General Interventions Discussed  Exercise Interventions   Exercise Discussed/Reviewed Exercise Discussed  Nutrition Interventions   Nutrition Discussed/Reviewed Nutrition Discussed, Fluid intake  Pharmacy Interventions   Pharmacy Dicussed/Reviewed Pharmacy Topics Discussed  Safety Interventions   Safety Discussed/Reviewed Safety Discussed        Follow up plan: No further intervention required.   Encounter Outcome:  Patient Visit Completed   Juanell Fairly RN, BSN, [facility]  [facility], [facility]  Care Coordinator Phone: [phone]

## 2023-01-09 NOTE — Patient Instructions (Signed)
Visit Information  Thank you for taking time to visit with me today. Please don't hesitate to contact me if I can be of assistance to you.   Following are the goals we discussed today:   Goals Addressed               This Visit's Progress     I want to continue on my weight loss journey and monitor my breathing (pt-stated)        Patient Goals/Self Care Activities: -Patient/Caregiver will take medications as prescribed   -Patient/Caregiver will attend all scheduled provider appointments -Patient/Caregiver will call pharmacy for medication refills 3-7 days in advance of running out of medications -Patient/Caregiver will call provider office for new concerns or questions  -Patient/Caregiver will focus on medication adherence by taking medications as prescribed   -Avoid smoke and air pollution -Keep your airway clear from mucus build up  -Use a humidifier, if needed -Utilize infection prevention strategies to reduce risk of respiratory infection   - stock up on healthy food choices - drink 6 to 8 glasses of water each day -Stay in the cool when you can for your breathing          If you are experiencing a Mental Health or Behavioral Health Crisis or need someone to talk to, please call 1-800-273-TALK (toll free, 24 hour hotline)  Patient verbalizes understanding of instructions and care plan provided today and agrees to view in MyChart. Active MyChart status and patient understanding of how to access instructions and care plan via MyChart confirmed with patient.     Juanell Fairly RN, BSN, [facility] [place]  [facility], [facility]  Care Coordinator Phone: [phone]

## 2023-01-12 NOTE — Therapy (Signed)
OUTPATIENT PHYSICAL THERAPY LOWER EXTREMITY EVALUATION   Patient Name: Frances Medina MRN: [id number] DOB:05-Jun-1960, 62 y.o., female Today's Date: 01/13/2023  END OF SESSION:  PT End of Session - 01/13/23 0845     Visit Number 1    Number of Visits 17    Date for PT Re-Evaluation 03/10/23    Authorization Type medicare AB + [employer] state health    Authorization Time Period no auth per appt notes    Progress Note Due on Visit 10    PT Start Time 0845    PT Stop Time 0924    PT Time Calculation (min) 39 min    Activity Tolerance Patient tolerated treatment well;No increased pain    Behavior During Therapy [facility] for tasks assessed/performed             Past Medical History:  Diagnosis Date   Acute respiratory failure (HCC)    Acute respiratory failure with hypoxemia (HCC)    Aspiration into airway    Back pain 07/31/2017   Benign neoplasm of rectum    CAP (community acquired pneumonia) 08/05/2015   Depression with anxiety    Diverticulosis    Dysphagia 03/2015    EGD, Dr Leone Payor. mild antral gastritis, ? due to Ibuprofen.  no stricture but empirically maloney dilated esophagus.    Dysphagia, neurologic    E. 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   GERD (gastroesophageal reflux disease)    hiatal hernia   Hypertension    "went away when I stopped smoking"   Knee injury    Left hip pain 02/18/2017   Migraine    "maybe couple times/month" (03/20/2015)   Mild intermittent asthma 06/22/2017   Myasthenia gravis (HCC) 2017   Myasthenia gravis with acute exacerbation (HCC) 05/15/2015   Osteoarthritis of left knee 12/02/2013   Osteoarthritis of right knee 08/30/2013   Pancreatitis 07/25/2017    Protein-calorie malnutrition, severe (HCC) 08/07/2015   Seasonal allergies    takes Zytrec   Tracheostomy status (HCC)    Tubular adenoma of colon    Tumors    "in my stomach"   Umbilical hernia    watching , no plans for surgery at present   Urine incontinence 10/19/2015   VAP (ventilator-associated pneumonia) [facility])    Past Surgical History:  Procedure Laterality Date   BIOPSY  07/05/2019   Procedure: BIOPSY;  Surgeon: Meryl Dare, MD;  Location: [facility];  Service: Endoscopy;;   CESAREAN SECTION  1987; 1989   COLONOSCOPY WITH PROPOFOL N/A 09/19/2013   Procedure: COLONOSCOPY WITH PROPOFOL;  Surgeon: Meryl Dare, MD;  Location: [facility];  Service: Endoscopy;  Laterality: N/A;   COLONOSCOPY WITH PROPOFOL N/A 07/05/2019   Procedure: COLONOSCOPY WITH PROPOFOL;  Surgeon: Meryl Dare, MD;  Location: [facility];  Service: Endoscopy;  Laterality: N/A;   DILATION AND CURETTAGE OF UTERUS     ESOPHAGOGASTRODUODENOSCOPY N/A 08/10/2015   Procedure: ESOPHAGOGASTRODUODENOSCOPY (EGD);  Surgeon: Iva Boop, MD;  Location: [facility] ENDOSCOPY;  Service: Endoscopy;  Laterality: N/A;   ESOPHAGOGASTRODUODENOSCOPY (EGD) WITH PROPOFOL N/A 03/21/2015   Procedure: ESOPHAGOGASTRODUODENOSCOPY (EGD) WITH PROPOFOL;  Surgeon: Iva Boop, MD;  Location: [facility] ENDOSCOPY;  Service: Endoscopy;  Laterality: N/A;   ESOPHAGOGASTRODUODENOSCOPY (EGD) WITH PROPOFOL N/A 07/05/2019   Procedure: ESOPHAGOGASTRODUODENOSCOPY (EGD) WITH PROPOFOL;  Surgeon: Meryl Dare, MD;  Location: [facility];  Service: Endoscopy;  Laterality: N/A;   PARTIAL KNEE ARTHROPLASTY Right 08/30/2013   Procedure: RIGHT UNICOMPARTMENTAL KNEE;  Surgeon: Eulas Post, MD;  Location: [facility] OR;  Service: Orthopedics;  Laterality: Right;   PARTIAL KNEE ARTHROPLASTY Left 12/02/2013   Procedure: LEFT KNEE UNI ARTHROPLASTY;  Surgeon: Eulas Post, MD;  Location: [facility];  Service: Orthopedics;  Laterality: Left;   PEG  PLACEMENT N/A 08/10/2015   Procedure: PERCUTANEOUS ENDOSCOPIC GASTROSTOMY (PEG) PLACEMENT;  Surgeon: Iva Boop, MD;  Location[facility] ENDOSCOPY;  Service: Endoscopy;  Laterality: N/A;   POLYPECTOMY  07/05/2019   Procedure: POLYPECTOMY;  Surgeon: Meryl Dare, MD;  Location: [facility];  Service: Endoscopy;;   TUBAL LIGATION  1989   VAGINAL HYSTERECTOMY  1990's?   "apparently took out one of my ovaries at the time too cause one's missing"   Patient Active Problem List   Diagnosis Date Noted   Rash 01/27/2022   Lumbar radiculopathy 12/03/2021   Positive ANA (antinuclear antibody) 08/26/2019   History of colonic polyps    Macromastia 06/17/2019   Seasonal allergies 02/18/2019   GAD (generalized anxiety disorder) 08/06/2018   Sinusitis 02/25/2018   Iron deficiency anemia 07/31/2017   Pre-diabetes 07/31/2017   Osteoarthritis of left hip 02/18/2017   OSA on CPAP 09/11/2016   Restrictive lung disease 10/11/2015   Major depression    Headache, migraine    Dyspnea 04/07/2015   Splenomegaly 04/07/2015   Myasthenia gravis (HCC)    Osteoarthritis of left knee 12/02/2013   Benign neoplasm of descending colon 09/19/2013   Osteoarthritis of right knee 08/30/2013   GERD (gastroesophageal reflux disease) 06/23/2013   Morbid obesity (HCC) 06/23/2013   Fibroids 06/23/2013    PCP: Doreene Eland, MD  REFERRING PROVIDER: Louann Sjogren, DPM  REFERRING DIAG: [phone] (ICD-10-CM) - Tendonitis, Achilles, left  THERAPY DIAG:  Pain in left ankle and joints of left foot  Other abnormalities of gait and mobility  Rationale for Evaluation and Treatment: Rehabilitation  ONSET DATE: a couple months per pt report  SUBJECTIVE:   SUBJECTIVE STATEMENT: Cannot recall any precipitating factors, pt states it seems like it is slowly worsening. 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   Does report some L LE numbness and back pain when standing, reports this has been going on since knee surgeries. Tries to walk 3x/week with her sister for past couple weeks. Also reports swelling in her limb since her knee surgery, does not endorse any recent changes with this.   PERTINENT HISTORY: depression/anxiety, dysphagia, Cdiff, HTN, migraines, myasthenia gravis (in remission per pt), OSA  PAIN:  Are you having pain: 10/10 Location/description: L ankle, lateral heel; burning,cramping at times - aggravating factors: stair navigation, walking ~30 min at a time, driving, footwear - Easing factors: propping up foot, min relief from stretching, footwear  PRECAUTIONS: None  WEIGHT BEARING RESTRICTIONS: No  FALLS:  Has patient fallen in last 6 months? No - reports near falls due to pain   LIVING ENVIRONMENT: Lives w/ husband and two children 2 story house; 14 steps to second floor with rail, 4STE Pt does majority of housework Tub shower, no rails, no seat Has a walker, doesn't use it  OCCUPATION:  not working, on disability since 2016-2017 per pt report  PLOF: Independent - with cane use since knee surgeries  PATIENT GOALS: wants to heal her ankle   NEXT MD VISIT: December 31st  OBJECTIVE:  Note: Objective measures were completed at Evaluation unless otherwise noted.  DIAGNOSTIC FINDINGS:  12/30/22 L foot XR, refer to podiatry notes for details; noted spurring to posterior calcaneus per physician read  PATIENT SURVEYS:  FOTO 63 current, 66 predicted  COGNITION: Overall cognitive status: Within functional limits for tasks assessed     SENSATION: Light touch intact BIL LE, increased sensitivity to light touch posterior heel and lateral heel L  EDEMA:  Gross swelling noted about L ankle around joint line, no apparent erythema or bruising   PALPATION: Concordant tenderness posterior heel, lateral heel on L. No other muscular or bony tenderness foot or calf   LOWER  EXTREMITY ROM:     Active ROM Right eval Left eval  Knee flexion    Knee extension    Ankle dorsiflexion 12 deg 12 deg *  Ankle plantarflexion 40 deg 32 deg *  Ankle inversion 29 deg 15 deg *  Ankle eversion 15 deg 14 deg *   (Blank rows = not tested) Comments:  no pain with PROM (Blank rows = not tested) (Key: WFL = within functional limits not formally assessed, * = concordant pain, s = stiffness/stretching sensation, NT = not tested)  Comments:    LOWER EXTREMITY MMT:  MMT Right eval Left eval  Knee flexion    Knee extension    Ankle dorsiflexion 5 4+  Ankle plantarflexion    Ankle inversion 5 4+  Ankle eversion 5 4+   (Blank rows = not tested) Comments:   (Blank rows = not tested) (Key: WFL = within functional limits not formally assessed, * = concordant pain, s = stiffness/stretching sensation, NT = not tested)  Comments:    LOWER EXTREMITY SPECIAL TESTS:  deferred  FUNCTIONAL TESTS:  5xSTS: 11.96sec without UE support, does increase heel pain  GAIT: Distance walked: within clinic Assistive device utilized: Single point cane and None Level of assistance: Modified independence Comments: antalgic gait LLE, reduced gait speed/cadence   TODAY'S TREATMENT:                                                                                                                              [facility] Adult PT Treatment:                                                DATE: 01/13/23 Therapeutic Exercise: Ankle inversion/eversion towel slides x8 Seated heel raises x8 HEP handout + education    PATIENT EDUCATION:  Education details: Pt education on PT impairments, prognosis, and POC. Informed consent. Rationale for interventions, safe/appropriate HEP performance Person educated: Patient Education method:  Explanation, Demonstration, Tactile cues, Verbal cues, and Handouts Education comprehension: verbalized understanding, returned demonstration, verbal cues required, tactile  cues required, and needs further education    HOME EXERCISE PROGRAM: Access Code: [email or web address] URL: [email or web address] Date: 01/13/2023 Prepared by: Fransisco Hertz  Exercises - Ankle Inversion Eversion Towel Slide  - 2-3 x daily - 1 sets - 8 reps - Seated Heel Raise  - 2-3 x daily - 1 sets - 8 reps  ASSESSMENT:  CLINICAL IMPRESSION: Patient is a pleasant 62 y.o. woman who was seen today for physical therapy evaluation and treatment for L achilles tendon issues. She endorses difficulty with WB tasks, particularly stair navigation. On exam she demonstrates painful limitations in L ankle mobility and strength, hypersensitivity to light touch at posterior/lateral heel. Symptoms are irritable throughout although she denies any increase in resting pain. Does well with HEP, no increase in symptoms. No adverse events. Recommend trial of skilled PT to address aforementioned deficits with aim of improving functional tolerance and reducing pain with typical activities. Pt departs today's session in no acute distress, all voiced concerns/questions addressed appropriately from PT perspective.      OBJECTIVE IMPAIRMENTS: Abnormal gait, decreased activity tolerance, decreased endurance, decreased mobility, difficulty walking, decreased ROM, decreased strength, improper body mechanics, and pain.   ACTIVITY LIMITATIONS: standing, squatting, stairs, transfers, and locomotion level  PARTICIPATION LIMITATIONS: meal prep, cleaning, laundry, driving, and community activity  PERSONAL FACTORS: Age, Time since onset of injury/illness/exacerbation, and 3+ comorbidities: depression/anxiety, dysphagia, Cdiff, HTN, migraines, myasthenia gravis, tracheostomy, OSA  are also affecting patient's functional outcome.   REHAB POTENTIAL: Fair given comorbidities  CLINICAL DECISION MAKING: Evolving/moderate complexity  EVALUATION COMPLEXITY: Moderate   GOALS: Goals reviewed with patient? Yes  SHORT TERM  GOALS: Target date: 02/10/2023 Pt will demonstrate appropriate understanding and performance of initially prescribed HEP in order to facilitate improved independence with management of symptoms.  Baseline: HEP provided on eval Goal status: INITIAL   2. Pt will report ability to ambulate at least 30 min with less than 3 pt increase in pain on NPS in order to facilitate improved community navigation.  Baseline: pain with 30 min per pt report  Goal status: INITIAL   LONG TERM GOALS: Target date: 03/10/2023 Pt will score 66 or greater on FOTO in order to demonstrate improved perception of function due to symptoms.  Baseline: 63 Goal status: INITIAL  2.  Pt will demonstrate grossly symmetrical ankle DF/inversion AROM in order to facilitate improved tolerance to functional movements. Baseline: see ROM chart above Goal status: INITIAL  3.  Pt will demonstrate appropriate performance of final prescribed HEP in order to facilitate improved self-management of symptoms post-discharge.   Baseline: initial HEP prescribed  Goal status: INITIAL    4.  Pt will be able to perform 5xSTS in less than or equal to 9sec in order to demonstrate reduced fall risk and improved functional independence (MCID 5xSTS = 2.3 sec). Baseline: 11.96sec with increase in pain Goal status: INITIAL    PLAN:  PT FREQUENCY: 2x/week  PT DURATION: 8 weeks  PLANNED INTERVENTIONS: 97164- PT Re-evaluation, 97110-Therapeutic exercises, 97530- Therapeutic activity, 97112- Neuromuscular re-education, 97535- Self Care, 13086- Manual therapy, [phone]- Gait training, Patient/Family education, Balance training, Stair training, Taping, Dry Needling, Joint mobilization, Cryotherapy, and Moist heat  PLAN FOR NEXT SESSION: Review/update HEP PRN. Work on [employer] exercises as appropriate with emphasis on ankle strength, foot intrinsic/extrinsic stability. Symptom modification strategies as indicated/appropriate.    Ashley Murrain PT,  DPT 01/13/2023 11:11 AM

## 2023-01-13 ENCOUNTER — Encounter: Payer: Self-pay | Admitting: Physical Therapy

## 2023-01-13 ENCOUNTER — Ambulatory Visit: Payer: BC Managed Care – PPO | Attending: Podiatry | Admitting: Physical Therapy

## 2023-01-13 DIAGNOSIS — M7662 Achilles tendinitis, left leg: Secondary | ICD-10-CM | POA: Insufficient documentation

## 2023-01-13 DIAGNOSIS — R2689 Other abnormalities of gait and mobility: Secondary | ICD-10-CM | POA: Insufficient documentation

## 2023-01-13 DIAGNOSIS — M25572 Pain in left ankle and joints of left foot: Secondary | ICD-10-CM | POA: Diagnosis not present

## 2023-01-20 ENCOUNTER — Other Ambulatory Visit: Payer: Self-pay | Admitting: Family Medicine

## 2023-01-22 NOTE — Therapy (Signed)
OUTPATIENT PHYSICAL THERAPY LOWER EXTREMITY TREATMENT   Patient Name: Frances Medina MRN: [id number] DOB:04-21-1960, 62 y.o., female Today's Date: 01/26/2023  END OF SESSION:  PT End of Session - 01/26/23 0902     Visit Number 2    Number of Visits 17    Date for PT Re-Evaluation 03/10/23    Authorization Type medicare AB + [employer] state health    Progress Note Due on Visit 10    PT Start Time 0850    PT Stop Time 0930    PT Time Calculation (min) 40 min    Activity Tolerance Patient tolerated treatment well    Behavior During Therapy [facility] for tasks assessed/performed              Past Medical History:  Diagnosis Date   Acute respiratory failure (HCC)    Acute respiratory failure with hypoxemia (HCC)    Aspiration into airway    Back pain 07/31/2017   Benign neoplasm of rectum    CAP (community acquired pneumonia) 08/05/2015   Depression with anxiety    Diverticulosis    Dysphagia 03/2015    EGD, Dr Leone Payor. mild antral gastritis, ? due to Ibuprofen.  no stricture but empirically maloney dilated esophagus.    Dysphagia, neurologic    E. 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   GERD (gastroesophageal reflux disease)    hiatal hernia   Hypertension    "went away when I stopped smoking"   Knee injury    Left hip pain 02/18/2017   Migraine    "maybe couple times/month" (03/20/2015)   Mild intermittent asthma 06/22/2017   Myasthenia gravis (HCC) 2017   Myasthenia gravis with acute exacerbation (HCC) 05/15/2015   Osteoarthritis of left knee 12/02/2013   Osteoarthritis of right knee 08/30/2013   Pancreatitis 07/25/2017   Protein-calorie malnutrition, severe (HCC) 08/07/2015   Seasonal allergies     takes Zytrec   Tracheostomy status (HCC)    Tubular adenoma of colon    Tumors    "in my stomach"   Umbilical hernia    watching , no plans for surgery at present   Urine incontinence 10/19/2015   VAP (ventilator-associated pneumonia) [facility])    Past Surgical History:  Procedure Laterality Date   BIOPSY  07/05/2019   Procedure: BIOPSY;  Surgeon: Meryl Dare, MD;  Location: [facility] ENDOSCOPY;  Service: Endoscopy;;   CESAREAN SECTION  1987; 1989   COLONOSCOPY WITH PROPOFOL N/A 09/19/2013   Procedure: COLONOSCOPY WITH PROPOFOL;  Surgeon: Meryl Dare, MD;  Location: [facility] ENDOSCOPY;  Service: Endoscopy;  Laterality: N/A;   COLONOSCOPY WITH PROPOFOL N/A 07/05/2019   Procedure: COLONOSCOPY WITH PROPOFOL;  Surgeon: Meryl Dare, MD;  Location: [facility] ENDOSCOPY;  Service: Endoscopy;  Laterality: N/A;   DILATION AND CURETTAGE OF UTERUS     ESOPHAGOGASTRODUODENOSCOPY N/A 08/10/2015   Procedure: ESOPHAGOGASTRODUODENOSCOPY (EGD);  Surgeon: Iva Boop, MD;  Location: [facility] ENDOSCOPY;  Service: Endoscopy;  Laterality: N/A;   ESOPHAGOGASTRODUODENOSCOPY (EGD) WITH PROPOFOL N/A 03/21/2015   Procedure: ESOPHAGOGASTRODUODENOSCOPY (EGD) WITH PROPOFOL;  Surgeon: Iva Boop, MD;  Location: [facility] ENDOSCOPY;  Service: Endoscopy;  Laterality: N/A;   ESOPHAGOGASTRODUODENOSCOPY (EGD) WITH PROPOFOL N/A 07/05/2019  Procedure: ESOPHAGOGASTRODUODENOSCOPY (EGD) WITH PROPOFOL;  Surgeon: Meryl Dare, MD;  Location: [facility] ENDOSCOPY;  Service: Endoscopy;  Laterality: N/A;   PARTIAL KNEE ARTHROPLASTY Right 08/30/2013   Procedure: RIGHT UNICOMPARTMENTAL KNEE;  Surgeon: Eulas Post, MD;  Location: [facility] OR;  Service: Orthopedics;  Laterality: Right;   PARTIAL KNEE ARTHROPLASTY Left 12/02/2013   Procedure: LEFT KNEE UNI ARTHROPLASTY;  Surgeon: Eulas Post, MD;  Location: [facility];  Service: Orthopedics;  Laterality: Left;   PEG PLACEMENT N/A 08/10/2015   Procedure: PERCUTANEOUS ENDOSCOPIC GASTROSTOMY (PEG)  PLACEMENT;  Surgeon: Iva Boop, MD;  Location: [facility] ENDOSCOPY;  Service: Endoscopy;  Laterality: N/A;   POLYPECTOMY  07/05/2019   Procedure: POLYPECTOMY;  Surgeon: Meryl Dare, MD;  Location: [facility] ENDOSCOPY;  Service: Endoscopy;;   TUBAL LIGATION  1989   VAGINAL HYSTERECTOMY  1990's?   "apparently took out one of my ovaries at the time too cause one's missing"   Patient Active Problem List   Diagnosis Date Noted   Lumbar radiculopathy 12/03/2021   Positive ANA (antinuclear antibody) 08/26/2019   History of colonic polyps    Macromastia 06/17/2019   Seasonal allergies 02/18/2019   GAD (generalized anxiety disorder) 08/06/2018   Sinusitis 02/25/2018   Iron deficiency anemia 07/31/2017   Pre-diabetes 07/31/2017   Osteoarthritis of left hip 02/18/2017   OSA on CPAP 09/11/2016   Restrictive lung disease 10/11/2015   Major depression    Headache, migraine    Splenomegaly 04/07/2015   Myasthenia gravis (HCC)    Osteoarthritis of left knee 12/02/2013   Benign neoplasm of descending colon 09/19/2013   Osteoarthritis of right knee 08/30/2013   GERD (gastroesophageal reflux disease) 06/23/2013   Morbid obesity (HCC) 06/23/2013   Fibroids 06/23/2013    PCP: Doreene Eland, MD  REFERRING PROVIDER: Louann Sjogren, DPM  REFERRING DIAG: [phone] (ICD-10-CM) - Tendonitis, Achilles, left  THERAPY DIAG:  Pain in left ankle and joints of left foot  Other abnormalities of gait and mobility  Rationale for Evaluation and Treatment: Rehabilitation  ONSET DATE: a couple months per pt report  SUBJECTIVE:   SUBJECTIVE STATEMENT: My left heel painful like a 10/10 today.  I have numbness too. I walk 20 minutes and try to be active. When I go up my 4 steps once I get to the top I feel like I am going backwards.    EVAL- Cannot recall any precipitating factors, pt states it seems like it is slowly worsening. 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  Does report some L LE numbness and back pain when standing, reports this has been going on since knee surgeries. Tries to walk 3x/week with her sister for past couple weeks. Also reports swelling in her limb since her knee surgery, does not endorse any recent changes with this.   PERTINENT HISTORY: depression/anxiety, dysphagia, Cdiff, HTN, migraines, myasthenia gravis (in remission per pt), OSA  PAIN:  Are you having pain: 10/10 Location/description: L ankle, lateral heel; burning,cramping at times - aggravating factors: stair navigation, walking ~30 min at a time, driving, footwear - Easing factors: propping up foot, min relief from stretching, footwear  PRECAUTIONS: None  WEIGHT BEARING RESTRICTIONS: No  FALLS:  Has patient fallen in last 6 months? No - reports near falls due to pain   LIVING ENVIRONMENT: Lives w/ husband and two children 2 story house; 74  steps to second floor with rail, 4STE Pt does majority of housework Tub shower, no rails, no seat Has a walker, doesn't use it  OCCUPATION: not working, on disability since 2016-2017 per pt report  PLOF: Independent - with cane use since knee surgeries  PATIENT GOALS: wants to heal her ankle   NEXT MD VISIT: December 31st  OBJECTIVE:  Note: Objective measures were completed at Evaluation unless otherwise noted.  DIAGNOSTIC FINDINGS:  12/30/22 L foot XR, refer to podiatry notes for details; noted spurring to posterior calcaneus per physician read  PATIENT SURVEYS:  FOTO 63 current, 66 predicted  COGNITION: Overall cognitive status: Within functional limits for tasks assessed     SENSATION: Light touch intact BIL LE, increased sensitivity to light touch posterior heel and lateral heel L  EDEMA:  Gross swelling noted about L ankle around joint line, no apparent erythema or bruising   PALPATION: Concordant tenderness posterior heel, lateral  heel on L. No other muscular or bony tenderness foot or calf   LOWER EXTREMITY ROM:     Active ROM Right eval Left eval  Knee flexion    Knee extension    Ankle dorsiflexion 12 deg 12 deg *  Ankle plantarflexion 40 deg 32 deg *  Ankle inversion 29 deg 15 deg *  Ankle eversion 15 deg 14 deg *   (Blank rows = not tested) Comments:  no pain with PROM (Blank rows = not tested) (Key: WFL = within functional limits not formally assessed, * = concordant pain, s = stiffness/stretching sensation, NT = not tested)  Comments:    LOWER EXTREMITY MMT:  MMT Right eval Left eval  Knee flexion    Knee extension    Ankle dorsiflexion 5 4+  Ankle plantarflexion    Ankle inversion 5 4+  Ankle eversion 5 4+   (Blank rows = not tested) Comments:   (Blank rows = not tested) (Key: WFL = within functional limits not formally assessed, * = concordant pain, s = stiffness/stretching sensation, NT = not tested)  Comments:    LOWER EXTREMITY SPECIAL TESTS:  deferred  FUNCTIONAL TESTS:  5xSTS: 11.96sec without UE support, does increase heel pain  GAIT: Distance walked: within clinic Assistive device utilized: Single point cane and None Level of assistance: Modified independence Comments: antalgic gait LLE, reduced gait speed/cadence   TODAY'S TREATMENT:       [facility] Adult PT Treatment:                                                DATE: 01-26-23 Therapeutic Exercise: Nustep 6 min with UE/LE Slant board stretch 15 sec x 3 on Right painful for pt. Seated heel raise 2 x 8 Isometric hold 45 sec standing heel raise at various angles x 5 with UE on wall  1 minute rest in between Seated hip abduction with RTB 2 x 10 Manual Therapy: STW to L ankle for edema/swelling control  [facility] Adult PT Treatment:                                                DATE: 01/13/23 Therapeutic  Exercise: Ankle inversion/eversion towel slides x8 Seated heel raises x8 HEP handout + education    PATIENT EDUCATION:  Education details: Pt education on PT impairments, prognosis, and POC. Informed consent. Rationale for interventions, safe/appropriate HEP performance Person educated: Patient Education method: Explanation, Demonstration, Tactile cues, Verbal cues, and Handouts Education comprehension: verbalized understanding, returned demonstration, verbal cues required, tactile cues required, and needs further education    HOME EXERCISE PROGRAM: Access Code: [email or web address] URL: [email or web address] Date: 01/13/2023 Prepared by: Fransisco Hertz  Exercises - Ankle Inversion Eversion Towel Slide  - 2-3 x daily - 1 sets - 8 reps - Seated Heel Raise  - 2-3 x daily - 1 sets - 8 reps Added 01-26-23 - Isometric Heel Raise at Wall  - 2 x daily - 7 x weekly - 1 sets - 5 reps - 45 sec rest 1 min hold - Seated Hip Abduction with Resistance  - 1 x daily - 7 x weekly - 3 sets - 10 reps ASSESSMENT:  CLINICAL IMPRESSION: Azariya enters clinic with 10/10 pain in left achilles and swollen Left ankle. Pt attempts to do slant board stretch with pain and then warms up with Nustep.  Pt responds well to isometric hold for 45 sec for decrease in pain to 7/10. HEP updated for increased load within tolerance to exercise.  Pt will continue to benefit from increasing load capacity for better ambulation mechanics.  Pt left with all questions answered from PT perspective   EVAL- Patient is a pleasant 62 y.o. woman who was seen today for physical therapy evaluation and treatment for L achilles tendon issues. She endorses difficulty with WB tasks, particularly stair navigation. On exam she demonstrates painful limitations in L ankle mobility and strength, hypersensitivity to light touch at posterior/lateral heel. Symptoms are irritable throughout although she denies any increase in resting pain. Does well  with HEP, no increase in symptoms. No adverse events. Recommend trial of skilled PT to address aforementioned deficits with aim of improving functional tolerance and reducing pain with typical activities. Pt departs today's session in no acute distress, all voiced concerns/questions addressed appropriately from PT perspective.      OBJECTIVE IMPAIRMENTS: Abnormal gait, decreased activity tolerance, decreased endurance, decreased mobility, difficulty walking, decreased ROM, decreased strength, improper body mechanics, and pain.   ACTIVITY LIMITATIONS: standing, squatting, stairs, transfers, and locomotion level  PARTICIPATION LIMITATIONS: meal prep, cleaning, laundry, driving, and community activity  PERSONAL FACTORS: Age, Time since onset of injury/illness/exacerbation, and 3+ comorbidities: depression/anxiety, dysphagia, Cdiff, HTN, migraines, myasthenia gravis, tracheostomy, OSA  are also affecting patient's functional outcome.   REHAB POTENTIAL: Fair given comorbidities  CLINICAL DECISION MAKING: Evolving/moderate complexity  EVALUATION COMPLEXITY: Moderate   GOALS: Goals reviewed with patient? Yes  SHORT TERM GOALS: Target date: 02/10/2023 Pt will demonstrate appropriate understanding and performance of initially prescribed HEP in order to facilitate improved independence with management of symptoms.  Baseline: HEP provided on eval Goal status: ONGOING  2. Pt will report ability to ambulate at least 30 min with less than 3 pt increase in pain on NPS in order to facilitate improved community navigation.  Baseline: pain with 30 min per pt report  Goal status: ONGOING  LONG TERM GOALS: Target date: 03/10/2023 Pt will score 66 or greater on FOTO in order to demonstrate improved perception of function due to symptoms.  Baseline: 63 Goal status: INITIAL  2.  Pt will demonstrate grossly symmetrical ankle DF/inversion AROM in order to facilitate improved tolerance to functional  movements. Baseline: see ROM chart above Goal status: INITIAL  3.  Pt will demonstrate appropriate performance of final prescribed HEP in order to facilitate improved self-management of symptoms post-discharge.   Baseline: initial HEP prescribed  Goal status: INITIAL    4.  Pt will be able to perform 5xSTS in less than or equal to 9sec in order to demonstrate reduced fall risk and improved functional independence (MCID 5xSTS = 2.3 sec). Baseline: 11.96sec with increase in pain Goal status: INITIAL    PLAN:  PT FREQUENCY: 2x/week  PT DURATION: 8 weeks  PLANNED INTERVENTIONS: 97164- PT Re-evaluation, 97110-Therapeutic exercises, 97530- Therapeutic activity, 97112- Neuromuscular re-education, 97535- Self Care, 78295- Manual therapy, [phone]- Gait training, Patient/Family education, Balance training, Stair training, Taping, Dry Needling, Joint mobilization, Cryotherapy, and Moist heat  PLAN FOR NEXT SESSION: Review/update HEP PRN. Work on [employer] exercises as appropriate with emphasis on ankle strength, foot intrinsic/extrinsic stability. Symptom modification strategies as indicated/appropriate.    Garen Lah, PT, ATRIC Certified Exercise Expert for the Aging Adult  01/26/23 9:38 AM Phone: [phone] Fax: [phone]

## 2023-01-26 ENCOUNTER — Encounter: Payer: Self-pay | Admitting: Family Medicine

## 2023-01-26 ENCOUNTER — Encounter: Payer: Self-pay | Admitting: Physical Therapy

## 2023-01-26 ENCOUNTER — Ambulatory Visit: Payer: BC Managed Care – PPO | Admitting: Physical Therapy

## 2023-01-26 ENCOUNTER — Telehealth: Payer: BC Managed Care – PPO | Admitting: Family Medicine

## 2023-01-26 VITALS — Wt 280.0 lb

## 2023-01-26 DIAGNOSIS — R2689 Other abnormalities of gait and mobility: Secondary | ICD-10-CM | POA: Diagnosis not present

## 2023-01-26 DIAGNOSIS — Z6841 Body Mass Index (BMI) 40.0 and over, adult: Secondary | ICD-10-CM | POA: Diagnosis not present

## 2023-01-26 DIAGNOSIS — G4733 Obstructive sleep apnea (adult) (pediatric): Secondary | ICD-10-CM | POA: Diagnosis not present

## 2023-01-26 DIAGNOSIS — M7662 Achilles tendinitis, left leg: Secondary | ICD-10-CM | POA: Diagnosis not present

## 2023-01-26 DIAGNOSIS — M25572 Pain in left ankle and joints of left foot: Secondary | ICD-10-CM

## 2023-01-26 DIAGNOSIS — G7000 Myasthenia gravis without (acute) exacerbation: Secondary | ICD-10-CM

## 2023-01-26 NOTE — Assessment & Plan Note (Signed)
Stable on Cellcept

## 2023-01-26 NOTE — Patient Instructions (Signed)
Shortness of Breath, Adult Shortness of breath means you have trouble breathing. Shortness of breath could be a sign of a medical problem. Follow these instructions at home:  Pollution Do not smoke or use any products that contain nicotine or tobacco. If you need help quitting, ask your doctor. Avoid things that can make it harder to breathe, such as: Smoke of all kinds. This includes smoke from campfires or forest fires. Do not smoke or allow others to smoke in your home. Mold. Dust. Air pollution. Chemical smells. Things that can give you an allergic reaction (allergens) if you have allergies. Keep your living space clean. Use products that help remove mold and dust. General instructions Watch for any changes in your symptoms. Take over-the-counter and prescription medicines only as told by your doctor. This includes oxygen therapy and inhaled medicines. Rest as needed. Return to your normal activities when your doctor says that it is safe. Keep all follow-up visits. Contact a doctor if: Your condition does not get better as soon as expected. You have a hard time doing your normal activities, even after you rest. You have new symptoms. You cannot walk up stairs. You cannot exercise the way you normally do. Get help right away if: Your shortness of breath gets worse. You have trouble breathing when you are resting. You feel light-headed or you faint. You have a cough that is not helped by medicines. You cough up blood. You have pain with breathing. You have pain in your chest, arms, shoulders, or belly (abdomen). You have a fever. These symptoms may be an emergency. Get help right away. Call 911. Do not wait to see if the symptoms will go away. Do not drive yourself to the hospital. Summary Shortness of breath is when you have trouble breathing enough air. It can be a sign of a medical problem. Avoid things that make it hard for you to breathe, such as smoking, pollution,  mold, and dust. Watch for any changes in your symptoms. Contact your doctor if you do not get better or you get worse. This information is not intended to replace advice given to you by your health care provider. Make sure you discuss any questions you have with your health care provider. Document Revised: 10/06/2020 Document Reviewed: 10/06/2020 Elsevier Patient Education  2024 [employer].

## 2023-01-26 NOTE — Assessment & Plan Note (Signed)
Off Wegovy Continue Metformin for now Will readdress at her appointment with me in Jan

## 2023-01-26 NOTE — Assessment & Plan Note (Signed)
Her most recent PFT and ECHO reviewed with her essentially reassuring PFT normal with no evidence of obstructive pulm dx Continue Albuterol prn and Wixela 1 puff BP I discussed need for her to see her pulmonologist for CPAP titration She has an appointment with them in Jan She will call today for an earlier appointment Consider repeating ECHO if her SOB persists She agreed with the plan

## 2023-01-26 NOTE — Progress Notes (Signed)
   [facility] Telemedicine Visit  Patient consented to have virtual visit and was identified by name and date of birth. Method of visit: Video  Encounter participants: Patient: Frances Medina - located in her car Provider: Janit Pagan - located at [facility] Office Others (if applicable):  N/A  SUBJECTIVE:   CHIEF COMPLAINT / HPI: SOB/Weigh management  Shortness of Breath Chronicity: SOB on exertion. She attributed this to weight gain. Symptoms occurs mostly at nighttime. Whenever she uses her CPAP at night, it does not work effectively. The current episode started more than 1 month ago. The problem occurs daily. The problem has been waxing and waning. Duration: Daily at night when she goes upstairs to go to bed. Associated symptoms include leg swelling and wheezing. Pertinent negatives include no chest pain, orthopnea, PND, sputum production or vomiting. Exacerbated by: Stair climbing, walking,at night. Associated symptoms comments: She coughs with wheezing sometime at night and will use her albuterol. Left leg swollen with fluid. Treatments tried: Albuterol and Wixela as instructed. She is also compliant with her Cellcept for MG.   Weight management: She's gained a substantial percentage of her weight back. Off Wegovy x 1 yr due to lack of insurance coverage. She tried to eat small meals per day with minimal weight loss despite this. Her weight increased to 280 lbs.  PERTINENT  PMH / PSH: PMHx reviewed  OBJECTIVE:   Wt 280 lb (127 kg)   BMI 54.68 kg/m   Physical Exam Constitutional:      General: She is not in acute distress.    Appearance: She is not ill-appearing.  Pulmonary:     Effort: No respiratory distress.  Neurological:     Mental Status: She is alert.      ASSESSMENT/PLAN:   OSA on CPAP Her most recent PFT and ECHO reviewed with her essentially reassuring PFT normal with no evidence of obstructive pulm dx Continue Albuterol prn and Wixela  1 puff BP I discussed need for her to see her pulmonologist for CPAP titration She has an appointment with them in Jan She will call today for an earlier appointment Consider repeating ECHO if her SOB persists She agreed with the plan   Myasthenia gravis (HCC) Stable on Cellcept  BMI 50.0-59.9, adult (HCC) Off Wegovy Continue Metformin for now Will readdress at her appointment with me in Jan   Time spent during visit with patient: 15 minutes   Janit Pagan, MD [facility]

## 2023-01-27 ENCOUNTER — Encounter: Payer: Self-pay | Admitting: Physical Therapy

## 2023-01-27 ENCOUNTER — Ambulatory Visit: Payer: BC Managed Care – PPO | Admitting: Physical Therapy

## 2023-01-27 DIAGNOSIS — M25572 Pain in left ankle and joints of left foot: Secondary | ICD-10-CM | POA: Diagnosis not present

## 2023-01-27 DIAGNOSIS — M7662 Achilles tendinitis, left leg: Secondary | ICD-10-CM | POA: Diagnosis not present

## 2023-01-27 DIAGNOSIS — R2689 Other abnormalities of gait and mobility: Secondary | ICD-10-CM

## 2023-01-27 NOTE — Therapy (Signed)
OUTPATIENT PHYSICAL THERAPY LOWER EXTREMITY TREATMENT   Patient Name: Frances Medina MRN: [id number] DOB:May 13, 1960, 62 y.o., female Today's Date: 01/27/2023  END OF SESSION:  PT End of Session - 01/27/23 1359     Visit Number 3    Number of Visits 17    Date for PT Re-Evaluation 03/10/23    Authorization Type medicare AB + [employer] state health    Authorization Time Period no auth per appt notes    Progress Note Due on Visit 10    PT Start Time 1400    PT Stop Time 1447    PT Time Calculation (min) 47 min              Past Medical History:  Diagnosis Date   Acute respiratory failure (HCC)    Acute respiratory failure with hypoxemia (HCC)    Aspiration into airway    Back pain 07/31/2017   Benign neoplasm of rectum    CAP (community acquired pneumonia) 08/05/2015   Depression with anxiety    Diverticulosis    Dysphagia 03/2015    EGD, Dr Leone Payor. mild antral gastritis, ? due to Ibuprofen.  no stricture but empirically maloney dilated esophagus.    Dysphagia, neurologic    E. 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   GERD (gastroesophageal reflux disease)    hiatal hernia   Hypertension    "went away when I stopped smoking"   Knee injury    Left hip pain 02/18/2017   Migraine    "maybe couple times/month" (03/20/2015)   Mild intermittent asthma 06/22/2017   Myasthenia gravis (HCC) 2017   Myasthenia gravis with acute exacerbation (HCC) 05/15/2015   Osteoarthritis of left knee 12/02/2013   Osteoarthritis of right knee 08/30/2013   Pancreatitis 07/25/2017   Protein-calorie malnutrition, severe (HCC) 08/07/2015   Seasonal allergies    takes Zytrec   Tracheostomy status (HCC)    Tubular adenoma  of colon    Tumors    "in my stomach"   Umbilical hernia    watching , no plans for surgery at present   Urine incontinence 10/19/2015   VAP (ventilator-associated pneumonia) [facility])    Past Surgical History:  Procedure Laterality Date   BIOPSY  07/05/2019   Procedure: BIOPSY;  Surgeon: Meryl Dare, MD;  Location: [facility] ENDOSCOPY;  Service: Endoscopy;;   CESAREAN SECTION  1987; 1989   COLONOSCOPY WITH PROPOFOL N/A 09/19/2013   Procedure: COLONOSCOPY WITH PROPOFOL;  Surgeon: Meryl Dare, MD;  Location: [facility] ENDOS[facility];  Service: Endoscopy;  Laterality: N/A;   COLONOSCOPY WITH PROPOFOL N/A 07/05/2019   Procedure: COLONOSCOPY WITH PROPOFOL;  Surgeon: Meryl Dare, MD;  Location: [facility];  Service: Endoscopy;  Laterality: N/A;   DILATION AND CURETTAGE OF UTERUS     ESOPHAGOGASTRODUODENOSCOPY N/A 08/10/2015   Procedure: ESOPHAGOGASTRODUODENOSCOPY (EGD);  Surgeon: Iva Boop, MD;  Location: [facility] ENDOSCOPY;  Service: Endoscopy;  Laterality: N/A;   ESOPHAGOGASTRODUODENOSCOPY (EGD) WITH PROPOFOL N/A 03/21/2015   Procedure: ESOPHAGOGASTRODUODENOSCOPY (EGD) WITH PROPOFOL;  Surgeon: Iva Boop, MD;  Location: [facility] ENDOSCOPY;  Service: Endoscopy;  Laterality: N/A;   ESOPHAGOGASTRODUODENOSCOPY (EGD) WITH PROPOFOL N/A 07/05/2019   Procedure: ESOPHAGOGASTRODUODENOSCOPY (EGD) WITH PROPOFOL;  Surgeon: Russella Dar,  Venita Lick, MD;  Location: [facility] ENDOSCOPY;  Service: Endoscopy;  Laterality: N/A;   PARTIAL KNEE ARTHROPLASTY Right 08/30/2013   Procedure: RIGHT UNICOMPARTMENTAL KNEE;  Surgeon: Eulas Post, MD;  Location: [facility] OR;  Service: Orthopedics;  Laterality: Right;   PARTIAL KNEE ARTHROPLASTY Left 12/02/2013   Procedure: LEFT KNEE UNI ARTHROPLASTY;  Surgeon: Eulas Post, MD;  Location: [facility];  Service: Orthopedics;  Laterality: Left;   PEG PLACEMENT N/A 08/10/2015   Procedure: PERCUTANEOUS ENDOSCOPIC GASTROSTOMY (PEG) PLACEMENT;  Surgeon: Iva Boop, MD;  Location: [facility] ENDOSCOPY;   Service: Endoscopy;  Laterality: N/A;   POLYPECTOMY  07/05/2019   Procedure: POLYPECTOMY;  Surgeon: Meryl Dare, MD;  Location: [facility] ENDOSCOPY;  Service: Endoscopy;;   TUBAL LIGATION  1989   VAGINAL HYSTERECTOMY  1990's?   "apparently took out one of my ovaries at the time too cause one's missing"   Patient Active Problem List   Diagnosis Date Noted   BMI 50.0-59.9, adult (HCC) 01/26/2023   Lumbar radiculopathy 12/03/2021   Positive ANA (antinuclear antibody) 08/26/2019   History of colonic polyps    Macromastia 06/17/2019   Seasonal allergies 02/18/2019   GAD (generalized anxiety disorder) 08/06/2018   Sinusitis 02/25/2018   Iron deficiency anemia 07/31/2017   Pre-diabetes 07/31/2017   Osteoarthritis of left hip 02/18/2017   OSA on CPAP 09/11/2016   Restrictive lung disease 10/11/2015   Major depression    Headache, migraine    Splenomegaly 04/07/2015   Myasthenia gravis (HCC)    Osteoarthritis of left knee 12/02/2013   Benign neoplasm of descending colon 09/19/2013   Osteoarthritis of right knee 08/30/2013   GERD (gastroesophageal reflux disease) 06/23/2013   Fibroids 06/23/2013    PCP: Doreene Eland, MD  REFERRING PROVIDER: Louann Sjogren, DPM  REFERRING DIAG: [phone] (ICD-10-CM) - Tendonitis, Achilles, left  THERAPY DIAG:  Pain in left ankle and joints of left foot  Other abnormalities of gait and mobility  Rationale for Evaluation and Treatment: Rehabilitation  ONSET DATE: a couple months per pt report  SUBJECTIVE:   SUBJECTIVE STATEMENT: Spasms last night from foot to knee. The ankle is hurting today.    EVAL- Cannot recall any precipitating factors, pt states it seems like it is slowly worsening. 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  Does report some L LE numbness and back pain when standing, reports this has been going on since knee  surgeries. Tries to walk 3x/week with her sister for past couple weeks. Also reports swelling in her limb since her knee surgery, does not endorse any recent changes with this.   PERTINENT HISTORY: depression/anxiety, dysphagia, Cdiff, HTN, migraines, myasthenia gravis (in remission per pt), OSA  PAIN:  Are you having pain: 7/10 Location/description: L ankle, lateral heel; burning,cramping at times - aggravating factors: stair navigation, walking ~30 min at a time, driving, footwear - Easing factors: propping up foot, min relief from stretching, footwear  PRECAUTIONS: None  WEIGHT BEARING RESTRICTIONS: No  FALLS:  Has patient fallen in last 6 months? No - reports near falls due to pain   LIVING ENVIRONMENT: Lives w/ husband and two children 2 story house; 14 steps to second floor with rail, 4STE Pt does majority of housework Tub shower, no rails, no seat Has a walker, doesn't use it  OCCUPATION: not working, on disability since 2016-2017 per pt report  PLOF:  Independent - with cane use since knee surgeries  PATIENT GOALS: wants to heal her ankle   NEXT MD VISIT: December 31st  OBJECTIVE:  Note: Objective measures were completed at Evaluation unless otherwise noted.  DIAGNOSTIC FINDINGS:  12/30/22 L foot XR, refer to podiatry notes for details; noted spurring to posterior calcaneus per physician read  PATIENT SURVEYS:  FOTO 63 current, 66 predicted  COGNITION: Overall cognitive status: Within functional limits for tasks assessed     SENSATION: Light touch intact BIL LE, increased sensitivity to light touch posterior heel and lateral heel L  EDEMA:  Gross swelling noted about L ankle around joint line, no apparent erythema or bruising   PALPATION: Concordant tenderness posterior heel, lateral heel on L. No other muscular or bony tenderness foot or calf   LOWER EXTREMITY ROM:     Active ROM Right eval Left eval  Knee flexion    Knee extension    Ankle  dorsiflexion 12 deg 12 deg *  Ankle plantarflexion 40 deg 32 deg *  Ankle inversion 29 deg 15 deg *  Ankle eversion 15 deg 14 deg *   (Blank rows = not tested) Comments:  no pain with PROM (Blank rows = not tested) (Key: WFL = within functional limits not formally assessed, * = concordant pain, s = stiffness/stretching sensation, NT = not tested)  Comments:    LOWER EXTREMITY MMT:  MMT Right eval Left eval  Knee flexion    Knee extension    Ankle dorsiflexion 5 4+  Ankle plantarflexion    Ankle inversion 5 4+  Ankle eversion 5 4+   (Blank rows = not tested) Comments:   (Blank rows = not tested) (Key: WFL = within functional limits not formally assessed, * = concordant pain, s = stiffness/stretching sensation, NT = not tested)  Comments:    LOWER EXTREMITY SPECIAL TESTS:  deferred  FUNCTIONAL TESTS:  5xSTS: 11.96sec without UE support, does increase heel pain  GAIT: Distance walked: within clinic Assistive device utilized: Single point cane and None Level of assistance: Modified independence Comments: antalgic gait LLE, reduced gait speed/cadence   TODAY'S TREATMENT:       [facility] Adult PT Treatment:                                                DATE: 01/27/23 Therapeutic Exercise: Seated calf stretch with towel x 2 for 30 sec each  Manual Therapy: TPR x 6 to calf and lateral lower leg.  STW to calf supine and prone Passive gastroc stretch Cross friction to achilles   Modalities: Ice pack to heel/ankle long sitting x 7 min    [facility] Adult PT Treatment:                                                DATE: 01-26-23 Therapeutic Exercise: Nustep 6 min with UE/LE Slant board stretch 15 sec x 3 on Right painful for pt. Seated heel raise 2 x 8 Isometric hold 45 sec standing heel raise at various angles x 5 with UE on wall  1 minute rest in between Seated hip abduction with RTB 2 x 10 Manual Therapy: STW to L ankle for edema/swelling control  [facility] Adult PT Treatment:                                                DATE: 01/13/23 Therapeutic Exercise: Ankle inversion/eversion towel slides x8 Seated heel raises x8 HEP handout + education    PATIENT EDUCATION:  Education details: Pt education on PT impairments, prognosis, and POC. Informed consent. Rationale for interventions, safe/appropriate HEP performance Person educated: Patient Education method: Explanation, Demonstration, Tactile cues, Verbal cues, and Handouts Education comprehension: verbalized understanding, returned demonstration, verbal cues required, tactile cues required, and needs further education    HOME EXERCISE PROGRAM: Access Code: [email or web address] URL: [email or web address] Date: 01/13/2023 Prepared by: Fransisco Hertz  Exercises - Ankle Inversion Eversion Towel Slide  - 2-3 x daily - 1 sets - 8 reps - Seated Heel Raise  - 2-3 x daily - 1 sets - 8 reps Added 01-26-23 - Isometric Heel Raise at Wall  - 2 x daily - 7 x weekly - 1 sets - 5 reps - 45 sec rest 1 min hold - Seated Hip Abduction with Resistance  - 1 x daily - 7 x weekly - 3 sets - 10 reps 01/27/23 - Seated Calf Stretch with Strap  - 1 x daily - 7 x weekly - 1 sets - 3 reps - 30 hold ASSESSMENT:  CLINICAL IMPRESSION: Frances Medina enters clinic with reports of pain ranging 3-10/10 pain and that she had spasms last night from lateral ankle to knee. Performed TPR to calf and peroneals. Pt reported decreased pain after manual therapy. Instructed in calf stetch and educated on use of ice to decrease achilles pain. Ice pack applied at end of session. Pt reported feeling better at end of session. Pt will benefit from improved functional tolerance and reducing pain with typical activities. Pt will continue to benefit from increasing load capacity for better ambulation mechanics.     EVAL- Patient is a pleasant 62  y.o. woman who was seen today for physical therapy evaluation and treatment for L achilles tendon issues. She endorses difficulty with WB tasks, particularly stair navigation. On exam she demonstrates painful limitations in L ankle mobility and strength, hypersensitivity to light touch at posterior/lateral heel. Symptoms are irritable throughout although she denies any increase in resting pain. Does well with HEP, no increase in symptoms. No adverse events. Recommend trial of skilled PT to address aforementioned deficits with aim of improving functional tolerance and reducing pain with typical activities. Pt departs today's session in no acute distress, all voiced concerns/questions addressed appropriately from PT perspective.      OBJECTIVE IMPAIRMENTS: Abnormal gait, decreased activity tolerance, decreased endurance, decreased mobility, difficulty walking, decreased ROM, decreased strength, improper body mechanics, and pain.   ACTIVITY LIMITATIONS: standing, squatting, stairs, transfers, and locomotion level  PARTICIPATION LIMITATIONS: meal prep, cleaning, laundry, driving, and community activity  PERSONAL FACTORS: Age, Time since onset of injury/illness/exacerbation, and 3+ comorbidities: depression/anxiety, dysphagia, Cdiff, HTN, migraines, myasthenia gravis, tracheostomy, OSA  are also affecting patient's functional outcome.   REHAB POTENTIAL: Fair given comorbidities  CLINICAL DECISION MAKING: Evolving/moderate complexity  EVALUATION COMPLEXITY: Moderate   GOALS: Goals reviewed with patient? Yes  SHORT TERM GOALS: Target date: 02/10/2023 Pt will demonstrate appropriate understanding and performance of initially prescribed HEP in order to facilitate improved independence with management of symptoms.  Baseline: HEP provided on eval Goal status: ONGOING  2. Pt  will report ability to ambulate at least 30 min with less than 3 pt increase in pain on NPS in order to facilitate improved  community navigation.  Baseline: pain with 30 min per pt report  Goal status: ONGOING  LONG TERM GOALS: Target date: 03/10/2023 Pt will score 66 or greater on FOTO in order to demonstrate improved perception of function due to symptoms.  Baseline: 63 Goal status: INITIAL  2.  Pt will demonstrate grossly symmetrical ankle DF/inversion AROM in order to facilitate improved tolerance to functional movements. Baseline: see ROM chart above Goal status: INITIAL  3.  Pt will demonstrate appropriate performance of final prescribed HEP in order to facilitate improved self-management of symptoms post-discharge.   Baseline: initial HEP prescribed  Goal status: INITIAL    4.  Pt will be able to perform 5xSTS in less than or equal to 9sec in order to demonstrate reduced fall risk and improved functional independence (MCID 5xSTS = 2.3 sec). Baseline: 11.96sec with increase in pain Goal status: INITIAL    PLAN:  PT FREQUENCY: 2x/week  PT DURATION: 8 weeks  PLANNED INTERVENTIONS: 97164- PT Re-evaluation, 97110-Therapeutic exercises, 97530- Therapeutic activity, 97112- Neuromuscular re-education, 97535- Self Care, 16109- Manual therapy, [phone]- Gait training, Patient/Family education, Balance training, Stair training, Taping, Dry Needling, Joint mobilization, Cryotherapy, and Moist heat  PLAN FOR NEXT SESSION: Review/update HEP PRN. Work on [employer] exercises as appropriate with emphasis on ankle strength, foot intrinsic/extrinsic stability. Symptom modification strategies as indicated/appropriate.    Jannette Spanner, PTA 01/27/23 3:30 PM Phone: [phone] Fax: [phone]

## 2023-02-02 ENCOUNTER — Ambulatory Visit: Payer: BC Managed Care – PPO | Attending: Podiatry | Admitting: Physical Therapy

## 2023-02-02 DIAGNOSIS — M25572 Pain in left ankle and joints of left foot: Secondary | ICD-10-CM | POA: Insufficient documentation

## 2023-02-02 DIAGNOSIS — R2689 Other abnormalities of gait and mobility: Secondary | ICD-10-CM | POA: Insufficient documentation

## 2023-02-02 NOTE — Therapy (Signed)
OUTPATIENT PHYSICAL THERAPY LOWER EXTREMITY TREATMENT   Patient Name: Frances Medina MRN: [id number] DOB:08/19/1960, 62 y.o., female Today's Date: 02/02/2023  END OF SESSION:  PT End of Session - 02/02/23 1104     Visit Number 4    Number of Visits 17    Date for PT Re-Evaluation 03/10/23    Authorization Type medicare AB + [employer] state health    Authorization Time Period no auth per appt notes    Progress Note Due on Visit 10    PT Start Time 1102    PT Stop Time 1155    PT Time Calculation (min) 53 min              Past Medical History:  Diagnosis Date   Acute respiratory failure (HCC)    Acute respiratory failure with hypoxemia (HCC)    Aspiration into airway    Back pain 07/31/2017   Benign neoplasm of rectum    CAP (community acquired pneumonia) 08/05/2015   Depression with anxiety    Diverticulosis    Dysphagia 03/2015    EGD, Dr Leone Payor. mild antral gastritis, ? due to Ibuprofen.  no stricture but empirically maloney dilated esophagus.    Dysphagia, neurologic    E. 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   GERD (gastroesophageal reflux disease)    hiatal hernia   Hypertension    "went away when I stopped smoking"   Knee injury    Left hip pain 02/18/2017   Migraine    "maybe couple times/month" (03/20/2015)   Mild intermittent asthma 06/22/2017   Myasthenia gravis (HCC) 2017   Myasthenia gravis with acute exacerbation (HCC) 05/15/2015   Osteoarthritis of left knee 12/02/2013   Osteoarthritis of right knee 08/30/2013   Pancreatitis 07/25/2017   Protein-calorie malnutrition, severe (HCC) 08/07/2015   Seasonal allergies    takes Zytrec   Tracheostomy status (HCC)    Tubular adenoma of  colon    Tumors    "in my stomach"   Umbilical hernia    watching , no plans for surgery at present   Urine incontinence 10/19/2015   VAP (ventilator-associated pneumonia) [facility])    Past Surgical History:  Procedure Laterality Date   BIOPSY  07/05/2019   Procedure: BIOPSY;  Surgeon: Meryl Dare, MD;  Location: [facility] ENDOSCOPY;  Service: Endoscopy;;   CESAREAN SECTION  1987; 1989   COLONOSCOPY WITH PROPOFOL N/A 09/19/2013   Procedure: COLONOSCOPY WITH PROPOFOL;  Surgeon: Meryl Dare, MD;  Location: [facility] ENDOSCOPY;  Service: Endoscopy;  Laterality: N/A;   COLONOSCOPY WITH PROPOFOL N/A 07/05/2019   Procedure: COLONOSCOPY WITH PROPOFOL;  Surgeon: Meryl Dare, MD;  Location: [facility] ENDOSCOPY;  Service: Endoscopy;  Laterality: N/A;   DILATION AND CURETTAGE OF UTERUS     ESOPHAGOGASTRODUODENOSCOPY N/A 08/10/2015   Procedure: ESOPHAGOGASTRODUODENOSCOPY (EGD);  Surgeon: Iva Boop, MD;  Location: [facility] ENDOSCOPY;  Service: Endoscopy;  Laterality: N/A;   ESOPHAGOGASTRODUODENOSCOPY (EGD) WITH PROPOFOL N/A 03/21/2015   Procedure: ESOPHAGOGASTRODUODENOSCOPY (EGD) WITH PROPOFOL;  Surgeon: Iva Boop, MD;  Location: [facility] ENDOSCOPY;  Service: Endoscopy;  Laterality: N/A;   ESOPHAGOGASTRODUODENOSCOPY (EGD) WITH PROPOFOL N/A 07/05/2019   Procedure: ESOPHAGOGASTRODUODENOSCOPY (EGD) WITH PROPOFOL;  Surgeon: Russella Dar,  Venita Lick, MD;  Location: [facility] ENDOSCOPY;  Service: Endoscopy;  Laterality: N/A;   PARTIAL KNEE ARTHROPLASTY Right 08/30/2013   Procedure: RIGHT UNICOMPARTMENTAL KNEE;  Surgeon: Eulas Post, MD;  Location: [facility] OR;  Service: Orthopedics;  Laterality: Right;   PARTIAL KNEE ARTHROPLASTY Left 12/02/2013   Procedure: LEFT KNEE UNI ARTHROPLASTY;  Surgeon: Eulas Post, MD;  Location: [facility];  Service: Orthopedics;  Laterality: Left;   PEG PLACEMENT N/A 08/10/2015   Procedure: PERCUTANEOUS ENDOSCOPIC GASTROSTOMY (PEG) PLACEMENT;  Surgeon: Iva Boop, MD;  Location: [facility] ENDOSCOPY;   Service: Endoscopy;  Laterality: N/A;   POLYPECTOMY  07/05/2019   Procedure: POLYPECTOMY;  Surgeon: Meryl Dare, MD;  Location: [facility] ENDOSCOPY;  Service: Endoscopy;;   TUBAL LIGATION  1989   VAGINAL HYSTERECTOMY  1990's?   "apparently took out one of my ovaries at the time too cause one's missing"   Patient Active Problem List   Diagnosis Date Noted   BMI 50.0-59.9, adult (HCC) 01/26/2023   Lumbar radiculopathy 12/03/2021   Positive ANA (antinuclear antibody) 08/26/2019   History of colonic polyps    Macromastia 06/17/2019   Seasonal allergies 02/18/2019   GAD (generalized anxiety disorder) 08/06/2018   Sinusitis 02/25/2018   Iron deficiency anemia 07/31/2017   Pre-diabetes 07/31/2017   Osteoarthritis of left hip 02/18/2017   OSA on CPAP 09/11/2016   Restrictive lung disease 10/11/2015   Major depression    Headache, migraine    Splenomegaly 04/07/2015   Myasthenia gravis (HCC)    Osteoarthritis of left knee 12/02/2013   Benign neoplasm of descending colon 09/19/2013   Osteoarthritis of right knee 08/30/2013   GERD (gastroesophageal reflux disease) 06/23/2013   Fibroids 06/23/2013    PCP: Doreene Eland, MD  REFERRING PROVIDER: Louann Sjogren, DPM  REFERRING DIAG: [phone] (ICD-10-CM) - Tendonitis, Achilles, left  THERAPY DIAG:  Pain in left ankle and joints of left foot  Other abnormalities of gait and mobility  Rationale for Evaluation and Treatment: Rehabilitation  ONSET DATE: a couple months per pt report  SUBJECTIVE:   SUBJECTIVE STATEMENT: The spasms are gone but the achilles is still hurting me. I think it is getting better. Compression socks help.    EVAL- Cannot recall any precipitating factors, pt states it seems like it is slowly worsening. 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  Does report some L LE numbness and back pain when  standing, reports this has been going on since knee surgeries. Tries to walk 3x/week with her sister for past couple weeks. Also reports swelling in her limb since her knee surgery, does not endorse any recent changes with this.   PERTINENT HISTORY: depression/anxiety, dysphagia, Cdiff, HTN, migraines, myasthenia gravis (in remission per pt), OSA  PAIN:  Are you having pain: 7/10 Location/description: L ankle, lateral heel; burning,cramping at times - aggravating factors: stair navigation, walking ~30 min at a time, driving, footwear - Easing factors: propping up foot, min relief from stretching, footwear  PRECAUTIONS: None  WEIGHT BEARING RESTRICTIONS: No  FALLS:  Has patient fallen in last 6 months? No - reports near falls due to pain   LIVING ENVIRONMENT: Lives w/ husband and two children 2 story house; 14 steps to second floor with rail, 4STE Pt does majority of housework Tub shower, no rails, no seat Has a walker, doesn't use it  OCCUPATION: not working, on  disability since 2016-2017 per pt report  PLOF: Independent - with cane use since knee surgeries  PATIENT GOALS: wants to heal her ankle   NEXT MD VISIT: December 31st  OBJECTIVE:  Note: Objective measures were completed at Evaluation unless otherwise noted.  DIAGNOSTIC FINDINGS:  12/30/22 L foot XR, refer to podiatry notes for details; noted spurring to posterior calcaneus per physician read  PATIENT SURVEYS:  FOTO 63 current, 66 predicted  COGNITION: Overall cognitive status: Within functional limits for tasks assessed     SENSATION: Light touch intact BIL LE, increased sensitivity to light touch posterior heel and lateral heel L  EDEMA:  Gross swelling noted about L ankle around joint line, no apparent erythema or bruising   PALPATION: Concordant tenderness posterior heel, lateral heel on L. No other muscular or bony tenderness foot or calf   LOWER EXTREMITY ROM:     Active ROM Right eval  Left eval  Knee flexion    Knee extension    Ankle dorsiflexion 12 deg 12 deg *  Ankle plantarflexion 40 deg 32 deg *  Ankle inversion 29 deg 15 deg *  Ankle eversion 15 deg 14 deg *   (Blank rows = not tested) Comments:  no pain with PROM (Blank rows = not tested) (Key: WFL = within functional limits not formally assessed, * = concordant pain, s = stiffness/stretching sensation, NT = not tested)  Comments:    LOWER EXTREMITY MMT:  MMT Right eval Left eval  Knee flexion    Knee extension    Ankle dorsiflexion 5 4+  Ankle plantarflexion    Ankle inversion 5 4+  Ankle eversion 5 4+   (Blank rows = not tested) Comments:   (Blank rows = not tested) (Key: WFL = within functional limits not formally assessed, * = concordant pain, s = stiffness/stretching sensation, NT = not tested)  Comments:    LOWER EXTREMITY SPECIAL TESTS:  deferred  FUNCTIONAL TESTS:  5xSTS: 11.96sec without UE support, does increase heel pain  GAIT: Distance walked: within clinic Assistive device utilized: Single point cane and None Level of assistance: Modified independence Comments: antalgic gait LLE, reduced gait speed/cadence   TODAY'S TREATMENT:       [facility] Adult PT Treatment:                                                DATE: 02/02/23 Therapeutic Exercise: Seated calf stretch Baps L3  Seated heel raise 5# on knee 10 x 3  Seated red band EV 10 x 2  Seated red band Inv 10 x 2  Seated blue band PF 10 x 2  Standing isometric calf raise hold 1 minute x 2 (45 sec rest break) Standing Gastroc stretch x 2 for 10 sec  SManual Therapy: TPR to gastroc followed by STW  Modalities: Ice pack to heel in prone x 10 min    [facility] Adult PT Treatment:                                                DATE: 01/27/23 Therapeutic Exercise: Seated calf stretch with towel x 2 for 30 sec each  Manual Therapy: TPR x 6 to calf and lateral lower leg.  STW  to calf supine and prone Passive gastroc  stretch Cross friction to achilles   Modalities: Ice pack to heel/ankle long sitting x 7 min    [facility] Adult PT Treatment:                                                DATE: 01-26-23 Therapeutic Exercise: Nustep 6 min with UE/LE Slant board stretch 15 sec x 3 on Right painful for pt. Seated heel raise 2 x 8 Isometric hold 45 sec standing heel raise at various angles x 5 with UE on wall  1 minute rest in between Seated hip abduction with RTB 2 x 10 Manual Therapy: STW to L ankle for edema/swelling control                                                                                                                       [facility] Adult PT Treatment:                                                DATE: 01/13/23 Therapeutic Exercise: Ankle inversion/eversion towel slides x8 Seated heel raises x8 HEP handout + education    PATIENT EDUCATION:  Education details: Pt education on PT impairments, prognosis, and POC. Informed consent. Rationale for interventions, safe/appropriate HEP performance Person educated: Patient Education method: Explanation, Demonstration, Tactile cues, Verbal cues, and Handouts Education comprehension: verbalized understanding, returned demonstration, verbal cues required, tactile cues required, and needs further education    HOME EXERCISE PROGRAM: Access Code: [email or web address] URL: [email or web address] Date: 01/13/2023 Prepared by: Fransisco Hertz  Exercises - Ankle Inversion Eversion Towel Slide  - 2-3 x daily - 1 sets - 8 reps - Seated Heel Raise  - 2-3 x daily - 1 sets - 8 reps Added 01-26-23 - Isometric Heel Raise at Wall  - 2 x daily - 7 x weekly - 1 sets - 5 reps - 45 sec rest 1 min hold - Seated Hip Abduction with Resistance  - 1 x daily - 7 x weekly - 3 sets - 10 reps 01/27/23 - Seated Calf Stretch with Strap  - 1 x daily - 7 x weekly - 1 sets - 3 reps - 30 hold ASSESSMENT:  CLINICAL IMPRESSION: Diane enters clinic with reports of pain  ranging 7/10 pain and that her spasms have decreased however achilles pain continues. She continues to have muscle spasms palpated in left gatroc which are softened with manual TPR and STW. Pt tolerated increase in volume of therex today with reports on burning at achilles during closed chain strengthening and stretching. At end of session she reported feeling decreased pain. Pt will benefit from improved functional tolerance and reducing pain with typical activities. Pt will continue to  benefit from increasing load capacity for better ambulation mechanics.     EVAL- Patient is a pleasant 62 y.o. woman who was seen today for physical therapy evaluation and treatment for L achilles tendon issues. She endorses difficulty with WB tasks, particularly stair navigation. On exam she demonstrates painful limitations in L ankle mobility and strength, hypersensitivity to light touch at posterior/lateral heel. Symptoms are irritable throughout although she denies any increase in resting pain. Does well with HEP, no increase in symptoms. No adverse events. Recommend trial of skilled PT to address aforementioned deficits with aim of improving functional tolerance and reducing pain with typical activities. Pt departs today's session in no acute distress, all voiced concerns/questions addressed appropriately from PT perspective.      OBJECTIVE IMPAIRMENTS: Abnormal gait, decreased activity tolerance, decreased endurance, decreased mobility, difficulty walking, decreased ROM, decreased strength, improper body mechanics, and pain.   ACTIVITY LIMITATIONS: standing, squatting, stairs, transfers, and locomotion level  PARTICIPATION LIMITATIONS: meal prep, cleaning, laundry, driving, and community activity  PERSONAL FACTORS: Age, Time since onset of injury/illness/exacerbation, and 3+ comorbidities: depression/anxiety, dysphagia, Cdiff, HTN, migraines, myasthenia gravis, tracheostomy, OSA  are also affecting patient's  functional outcome.   REHAB POTENTIAL: Fair given comorbidities  CLINICAL DECISION MAKING: Evolving/moderate complexity  EVALUATION COMPLEXITY: Moderate   GOALS: Goals reviewed with patient? Yes  SHORT TERM GOALS: Target date: 02/10/2023 Pt will demonstrate appropriate understanding and performance of initially prescribed HEP in order to facilitate improved independence with management of symptoms.  Baseline: HEP provided on eval Goal status: ONGOING  2. Pt will report ability to ambulate at least 30 min with less than 3 pt increase in pain on NPS in order to facilitate improved community navigation.  Baseline: pain with 30 min per pt report  Goal status: ONGOING  LONG TERM GOALS: Target date: 03/10/2023 Pt will score 66 or greater on FOTO in order to demonstrate improved perception of function due to symptoms.  Baseline: 63 Goal status: INITIAL  2.  Pt will demonstrate grossly symmetrical ankle DF/inversion AROM in order to facilitate improved tolerance to functional movements. Baseline: see ROM chart above Goal status: INITIAL  3.  Pt will demonstrate appropriate performance of final prescribed HEP in order to facilitate improved self-management of symptoms post-discharge.   Baseline: initial HEP prescribed  Goal status: INITIAL    4.  Pt will be able to perform 5xSTS in less than or equal to 9sec in order to demonstrate reduced fall risk and improved functional independence (MCID 5xSTS = 2.3 sec). Baseline: 11.96sec with increase in pain Goal status: INITIAL    PLAN:  PT FREQUENCY: 2x/week  PT DURATION: 8 weeks  PLANNED INTERVENTIONS: 97164- PT Re-evaluation, 97110-Therapeutic exercises, 97530- Therapeutic activity, 97112- Neuromuscular re-education, 97535- Self Care, [id number]- Manual therapy, [phone]- Gait training, Patient/Family education, Balance training, Stair training, Taping, Dry Needling, Joint mobilization, Cryotherapy, and Moist heat  PLAN FOR NEXT SESSION:  Review/update HEP PRN. Work on [employer] exercises as appropriate with emphasis on ankle strength, foot intrinsic/extrinsic stability. Symptom modification strategies as indicated/appropriate.    Jannette Spanner, PTA 02/02/23 12:23 PM Phone: [phone] Fax: [phone]

## 2023-02-04 ENCOUNTER — Ambulatory Visit: Payer: BC Managed Care – PPO | Admitting: Physical Therapy

## 2023-02-04 ENCOUNTER — Encounter: Payer: Self-pay | Admitting: Physical Therapy

## 2023-02-04 DIAGNOSIS — M25572 Pain in left ankle and joints of left foot: Secondary | ICD-10-CM | POA: Diagnosis not present

## 2023-02-04 DIAGNOSIS — R2689 Other abnormalities of gait and mobility: Secondary | ICD-10-CM | POA: Diagnosis not present

## 2023-02-04 NOTE — Therapy (Signed)
OUTPATIENT PHYSICAL THERAPY LOWER EXTREMITY TREATMENT   Patient Name: Frances Medina MRN: [id number] DOB:04/12/1960, 62 y.o., female Today's Date: 02/04/2023  END OF SESSION:  PT End of Session - 02/04/23 1105     Visit Number 5    Number of Visits 17    Date for PT Re-Evaluation 03/10/23    Authorization Type medicare AB + [employer] state health    Authorization Time Period no auth per appt notes    Progress Note Due on Visit 10    PT Start Time 1105    PT Stop Time 1152   ice   PT Time Calculation (min) 47 min    Activity Tolerance Patient tolerated treatment well    Behavior During Therapy [facility] for tasks assessed/performed               Past Medical History:  Diagnosis Date   Acute respiratory failure (HCC)    Acute respiratory failure with hypoxemia (HCC)    Aspiration into airway    Back pain 07/31/2017   Benign neoplasm of rectum    CAP (community acquired pneumonia) 08/05/2015   Depression with anxiety    Diverticulosis    Dysphagia 03/2015    EGD, Dr Leone Payor. mild antral gastritis, ? due to Ibuprofen.  no stricture but empirically maloney dilated esophagus.    Dysphagia, neurologic    E. 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   GERD (gastroesophageal reflux disease)    hiatal hernia   Hypertension    "went away when I stopped smoking"   Knee injury    Left hip pain 02/18/2017   Migraine    "maybe couple times/month" (03/20/2015)   Mild intermittent asthma 06/22/2017   Myasthenia gravis (HCC) 2017   Myasthenia gravis with acute exacerbation (HCC) 05/15/2015   Osteoarthritis of left knee 12/02/2013   Osteoarthritis of right knee 08/30/2013   Pancreatitis 07/25/2017    Protein-calorie malnutrition, severe (HCC) 08/07/2015   Seasonal allergies    takes Zytrec   Tracheostomy status (HCC)    Tubular adenoma of colon    Tumors    "in my stomach"   Umbilical hernia    watching , no plans for surgery at present   Urine incontinence 10/19/2015   VAP (ventilator-associated pneumonia) [facility])    Past Surgical History:  Procedure Laterality Date   BIOPSY  07/05/2019   Procedure: BIOPSY;  Surgeon: Meryl Dare, MD;  Location: [facility] ENDOSCOPY;  Service: Endoscopy;;   CESAREAN SECTION  1987; 1989   COLONOSCOPY WITH PROPOFOL N/A 09/19/2013   Procedure: COLONOSCOPY WITH PROPOFOL;  Surgeon: Meryl Dare, MD;  Location: [facility] ENDOSCOPY;  Service: Endoscopy;  Laterality: N/A;   COLONOSCOPY WITH PROPOFOL N/A 07/05/2019   Procedure: COLONOSCOPY WITH PROPOFOL;  Surgeon: Meryl Dare, MD;  Location: [facility] ENDOSCOPY;  Service: Endoscopy;  Laterality: N/A;   DILATION AND CURETTAGE OF UTERUS     ESOPHAGOGASTRODUODENOSCOPY N/A 08/10/2015   Procedure: ESOPHAGOGASTRODUODENOSCOPY (EGD);  Surgeon: Iva Boop, MD;  Location: [facility] ENDOSCOPY;  Service: Endoscopy;  Laterality: N/A;   ESOPHAGOGASTRODUODENOSCOPY (EGD) WITH PROPOFOL N/A 03/21/2015   Procedure: ESOPHAGOGASTRODUODENOSCOPY (EGD) WITH PROPOFOL;  Surgeon: Iva Boop, MD;  Location: [facility] ENDOSCOPY;  Service: Endoscopy;  Laterality: N/A;   ESOPHAGOGASTRODUODENOSCOPY (EGD) WITH PROPOFOL N/A 07/05/2019   Procedure: ESOPHAGOGASTRODUODENOSCOPY (EGD) WITH PROPOFOL;  Surgeon: Meryl Dare, MD;  Location: [facility] ENDOSCOPY;  Service: Endoscopy;  Laterality: N/A;   PARTIAL KNEE ARTHROPLASTY Right 08/30/2013   Procedure: RIGHT UNICOMPARTMENTAL KNEE;  Surgeon: Eulas Post, MD;  Location: [facility] OR;  Service: Orthopedics;  Laterality: Right;   PARTIAL KNEE ARTHROPLASTY Left 12/02/2013   Procedure: LEFT KNEE UNI ARTHROPLASTY;  Surgeon: Eulas Post, MD;  Location: [facility];  Service: Orthopedics;  Laterality: Left;   PEG  PLACEMENT N/A 08/10/2015   Procedure: PERCUTANEOUS ENDOSCOPIC GASTROSTOMY (PEG) PLACEMENT;  Surgeon: Iva Boop, MD;  Location: [facility] ENDOSCOPY;  Service: Endoscopy;  Laterality: N/A;   POLYPECTOMY  07/05/2019   Procedure: POLYPECTOMY;  Surgeon: Meryl Dare, MD;  Location: [facility] ENDOSCOPY;  Service: Endoscopy;;   TUBAL LIGATION  1989   VAGINAL HYSTERECTOMY  1990's?   "apparently took out one of my ovaries at the time too cause one's missing"   Patient Active Problem List   Diagnosis Date Noted   BMI 50.0-59.9, adult (HCC) 01/26/2023   Lumbar radiculopathy 12/03/2021   Positive ANA (antinuclear antibody) 08/26/2019   History of colonic polyps    Macromastia 06/17/2019   Seasonal allergies 02/18/2019   GAD (generalized anxiety disorder) 08/06/2018   Sinusitis 02/25/2018   Iron deficiency anemia 07/31/2017   Pre-diabetes 07/31/2017   Osteoarthritis of left hip 02/18/2017   OSA on CPAP 09/11/2016   Restrictive lung disease 10/11/2015   Major depression    Headache, migraine    Splenomegaly 04/07/2015   Myasthenia gravis (HCC)    Osteoarthritis of left knee 12/02/2013   Benign neoplasm of descending colon 09/19/2013   Osteoarthritis of right knee 08/30/2013   GERD (gastroesophageal reflux disease) 06/23/2013   Fibroids 06/23/2013    PCP: Doreene Eland, MD  REFERRING PROVIDER: Louann Sjogren, DPM  REFERRING DIAG: [phone] (ICD-10-CM) - Tendonitis, Achilles, left  THERAPY DIAG:  Pain in left ankle and joints of left foot  Other abnormalities of gait and mobility  Rationale for Evaluation and Treatment: Rehabilitation  ONSET DATE: a couple months per pt report  SUBJECTIVE:   SUBJECTIVE STATEMENT: 02/04/2023 Pt states she feels good as long as she's not walking. Has pins and needles in heel when walking. At around 7/10 when she's standing. Massage feels helpful, tends to feel relief after sessions.    EVAL- Cannot recall any precipitating factors, pt states it seems  like it is slowly worsening. 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  Does report some L LE numbness and back pain when standing, reports this has been going on since knee surgeries. Tries to walk 3x/week with her sister for past couple weeks. Also reports swelling in her limb since her knee surgery, does not endorse any recent changes with this.   PERTINENT HISTORY: depression/anxiety, dysphagia, Cdiff, HTN, migraines, myasthenia gravis (in remission per pt), OSA  PAIN:  Are you having pain: 7/10   Location/description: L ankle, lateral heel; burning,cramping at times - aggravating factors: stair navigation, walking ~30 min at a time, driving, footwear - Easing factors: propping up foot, min relief from stretching, footwear  PRECAUTIONS: None  WEIGHT BEARING RESTRICTIONS: No  FALLS:  Has patient fallen in last 6 months? No - reports near falls due to pain   LIVING  ENVIRONMENT: Lives w/ husband and two children 2 story house; 14 steps to second floor with rail, 4STE Pt does majority of housework Tub shower, no rails, no seat Has a walker, doesn't use it  OCCUPATION: not working, on disability since 2016-2017 per pt report  PLOF: Independent - with cane use since knee surgeries  PATIENT GOALS: wants to heal her ankle   NEXT MD VISIT: December 31st  OBJECTIVE:  Note: Objective measures were completed at Evaluation unless otherwise noted.  DIAGNOSTIC FINDINGS:  12/30/22 L foot XR, refer to podiatry notes for details; noted spurring to posterior calcaneus per physician read  PATIENT SURVEYS:  FOTO 63 current, 66 predicted  COGNITION: Overall cognitive status: Within functional limits for tasks assessed     SENSATION: Light touch intact BIL LE, increased sensitivity to light touch posterior heel and lateral heel L  EDEMA:  Gross swelling noted about L ankle  around joint line, no apparent erythema or bruising   PALPATION: Concordant tenderness posterior heel, lateral heel on L. No other muscular or bony tenderness foot or calf   LOWER EXTREMITY ROM:     Active ROM Right eval Left eval  Knee flexion    Knee extension    Ankle dorsiflexion 12 deg 12 deg *  Ankle plantarflexion 40 deg 32 deg *  Ankle inversion 29 deg 15 deg *  Ankle eversion 15 deg 14 deg *   (Blank rows = not tested) Comments:  no pain with PROM (Blank rows = not tested) (Key: WFL = within functional limits not formally assessed, * = concordant pain, s = stiffness/stretching sensation, NT = not tested)  Comments:    LOWER EXTREMITY MMT:  MMT Right eval Left eval  Knee flexion    Knee extension    Ankle dorsiflexion 5 4+  Ankle plantarflexion    Ankle inversion 5 4+  Ankle eversion 5 4+   (Blank rows = not tested) Comments:   (Blank rows = not tested) (Key: WFL = within functional limits not formally assessed, * = concordant pain, s = stiffness/stretching sensation, NT = not tested)  Comments:    LOWER EXTREMITY SPECIAL TESTS:  deferred  FUNCTIONAL TESTS:  5xSTS: 11.96sec without UE support, does increase heel pain  GAIT: Distance walked: within clinic Assistive device utilized: Single point cane and None Level of assistance: Modified independence Comments: antalgic gait LLE, reduced gait speed/cadence   TODAY'S TREATMENT:       [facility] Adult PT Treatment:                                                DATE: 02/04/23 Therapeutic Exercise: Seated green band PF 2x15 seated Seated green band eversion 2x10 Seated green band inversion 2x10 Seated green band DF 2x10 Standing heel raise isos, 3 bouts: 25 sec, 50sec, 54sec  Standing soleus slantboard stretch x8 gently rocking Standing gastroc slantboard stretch x8 gently rocking  Manual Therapy: Prone; STM to gastroc/soleus, plantar aspect of foot; trigger point release gastroc soleus, passive PF with  gentle pin and stretch (palm technique) gastroc soleus  Modalities: Cold pack, prone; L ankle/heel with good relief, no adverse events 10 min    [facility] Adult PT Treatment:  DATE: 02/02/23 Therapeutic Exercise: Seated calf stretch Baps L3  Seated heel raise 5# on knee 10 x 3  Seated red band EV 10 x 2  Seated red band Inv 10 x 2  Seated blue band PF 10 x 2  Standing isometric calf raise hold 1 minute x 2 (45 sec rest break) Standing Gastroc stretch x 2 for 10 sec  SManual Therapy: TPR to gastroc followed by STW  Modalities: Ice pack to heel in prone x 10 min    [facility] Adult PT Treatment:                                                DATE: 01/27/23 Therapeutic Exercise: Seated calf stretch with towel x 2 for 30 sec each  Manual Therapy: TPR x 6 to calf and lateral lower leg.  STW to calf supine and prone Passive gastroc stretch Cross friction to achilles   Modalities: Ice pack to heel/ankle long sitting x 7 min    [facility] Adult PT Treatment:                                                DATE: 01-26-23 Therapeutic Exercise: Nustep 6 min with UE/LE Slant board stretch 15 sec x 3 on Right painful for pt. Seated heel raise 2 x 8 Isometric hold 45 sec standing heel raise at various angles x 5 with UE on wall  1 minute rest in between Seated hip abduction with RTB 2 x 10 Manual Therapy: STW to L ankle for edema/swelling control                                                                                                                       [facility] Adult PT Treatment:                                                DATE: 01/13/23 Therapeutic Exercise: Ankle inversion/eversion towel slides x8 Seated heel raises x8 HEP handout + education    PATIENT EDUCATION:  Education details: rationale for interventions, HEP  Person educated: Patient Education method: Explanation, Demonstration, Tactile cues, Verbal cues Education  comprehension: verbalized understanding, returned demonstration, verbal cues required, tactile cues required, and needs further education     HOME EXERCISE PROGRAM: Access Code: [email or web address] URL: [email or web address] Date: 01/13/2023 Prepared by: Fransisco Hertz  Exercises - Ankle Inversion Eversion Towel Slide  - 2-3 x daily - 1 sets - 8 reps - Seated Heel Raise  - 2-3 x daily - 1 sets - 8 reps Added 01-26-23 - Isometric Heel Raise at Wall  -  2 x daily - 7 x weekly - 1 sets - 5 reps - 45 sec rest 1 min hold - Seated Hip Abduction with Resistance  - 1 x daily - 7 x weekly - 3 sets - 10 reps 01/27/23 - Seated Calf Stretch with Strap  - 1 x daily - 7 x weekly - 1 sets - 3 reps - 30 hold ASSESSMENT:  CLINICAL IMPRESSION: 02/04/2023 Pt arrives w/ 7/10 pain, relief after sessions but continued pain with standing/walking that is about the same. Today progressing for increased resistance with ankle strengthening and volume with mobility work, tolerates well. Manual at end of session with good relief. Ice per pt request. Denies pain at end of session, no adverse events. Recommend continuing along current POC in order to address relevant deficits and improve functional tolerance. Pt departs today's session in no acute distress, all voiced questions/concerns addressed appropriately from PT perspective.     EVAL- Patient is a pleasant 62 y.o. woman who was seen today for physical therapy evaluation and treatment for L achilles tendon issues. She endorses difficulty with WB tasks, particularly stair navigation. On exam she demonstrates painful limitations in L ankle mobility and strength, hypersensitivity to light touch at posterior/lateral heel. Symptoms are irritable throughout although she denies any increase in resting pain. Does well with HEP, no increase in symptoms. No adverse events. Recommend trial of skilled PT to address aforementioned deficits with aim of improving functional tolerance  and reducing pain with typical activities. Pt departs today's session in no acute distress, all voiced concerns/questions addressed appropriately from PT perspective.      OBJECTIVE IMPAIRMENTS: Abnormal gait, decreased activity tolerance, decreased endurance, decreased mobility, difficulty walking, decreased ROM, decreased strength, improper body mechanics, and pain.   ACTIVITY LIMITATIONS: standing, squatting, stairs, transfers, and locomotion level  PARTICIPATION LIMITATIONS: meal prep, cleaning, laundry, driving, and community activity  PERSONAL FACTORS: Age, Time since onset of injury/illness/exacerbation, and 3+ comorbidities: depression/anxiety, dysphagia, Cdiff, HTN, migraines, myasthenia gravis, tracheostomy, OSA  are also affecting patient's functional outcome.   REHAB POTENTIAL: Fair given comorbidities  CLINICAL DECISION MAKING: Evolving/moderate complexity  EVALUATION COMPLEXITY: Moderate   GOALS: Goals reviewed with patient? Yes  SHORT TERM GOALS: Target date: 02/10/2023 Pt will demonstrate appropriate understanding and performance of initially prescribed HEP in order to facilitate improved independence with management of symptoms.  Baseline: HEP provided on eval Goal status: ONGOING  2. Pt will report ability to ambulate at least 30 min with less than 3 pt increase in pain on NPS in order to facilitate improved community navigation.  Baseline: pain with 30 min per pt report  Goal status: ONGOING  LONG TERM GOALS: Target date: 03/10/2023 Pt will score 66 or greater on FOTO in order to demonstrate improved perception of function due to symptoms.  Baseline: 63 Goal status: INITIAL  2.  Pt will demonstrate grossly symmetrical ankle DF/inversion AROM in order to facilitate improved tolerance to functional movements. Baseline: see ROM chart above Goal status: INITIAL  3.  Pt will demonstrate appropriate performance of final prescribed HEP in order to facilitate improved  self-management of symptoms post-discharge.   Baseline: initial HEP prescribed  Goal status: INITIAL    4.  Pt will be able to perform 5xSTS in less than or equal to 9sec in order to demonstrate reduced fall risk and improved functional independence (MCID 5xSTS = 2.3 sec). Baseline: 11.96sec with increase in pain Goal status: INITIAL    PLAN:  PT FREQUENCY: 2x/week  PT DURATION:  8 weeks  PLANNED INTERVENTIONS: 97164- PT Re-evaluation, 97110-Therapeutic exercises, 97530- Therapeutic activity, [id number]- Neuromuscular re-education, 97535- Self Care, [id number]- Manual therapy, [phone]- Gait training, Patient/Family education, Balance training, Stair training, Taping, Dry Needling, Joint mobilization, Cryotherapy, and Moist heat  PLAN FOR NEXT SESSION: Review/update HEP PRN. Work on [employer] exercises as appropriate with emphasis on ankle strength, foot intrinsic/extrinsic stability. Symptom modification strategies as indicated/appropriate.    Ashley Murrain PT, DPT 02/04/2023 12:33 PM

## 2023-02-05 ENCOUNTER — Telehealth: Payer: Self-pay | Admitting: *Deleted

## 2023-02-05 NOTE — Progress Notes (Signed)
  Care Coordination Note  02/05/2023 Name: Frances Medina MRN: [id number] DOB: 1960-08-25  Frances Medina is a 62 y.o. year old female who is a primary care patient of Doreene Eland, MD and is actively engaged with the care management team. I reached out to Frances Medina by phone today to assist with re-scheduling a follow up visit with the RN Case Manager  Follow up plan: Unsuccessful telephone outreach attempt made. A HIPAA compliant phone message was left for the patient providing contact information and requesting a return call.   [facility]  Care Coordination Care Guide  Direct Dial: [phone]

## 2023-02-08 ENCOUNTER — Ambulatory Visit (HOSPITAL_COMMUNITY)
Admission: EM | Admit: 2023-02-08 | Discharge: 2023-02-08 | Disposition: A | Discharge status: Home / Self Care | Payer: BC Managed Care – PPO | Attending: Internal Medicine | Admitting: Internal Medicine

## 2023-02-08 ENCOUNTER — Ambulatory Visit (INDEPENDENT_AMBULATORY_CARE_PROVIDER_SITE_OTHER): Payer: BC Managed Care – PPO

## 2023-02-08 ENCOUNTER — Encounter (HOSPITAL_COMMUNITY): Payer: Self-pay

## 2023-02-08 DIAGNOSIS — R062 Wheezing: Secondary | ICD-10-CM | POA: Diagnosis not present

## 2023-02-08 DIAGNOSIS — R058 Other specified cough: Secondary | ICD-10-CM | POA: Diagnosis not present

## 2023-02-08 DIAGNOSIS — J441 Chronic obstructive pulmonary disease with (acute) exacerbation: Secondary | ICD-10-CM | POA: Diagnosis not present

## 2023-02-08 DIAGNOSIS — R918 Other nonspecific abnormal finding of lung field: Secondary | ICD-10-CM | POA: Diagnosis not present

## 2023-02-08 MED ORDER — ALBUTEROL SULFATE (2.5 MG/3ML) 0.083% IN NEBU
INHALATION_SOLUTION | RESPIRATORY_TRACT | Status: AC
  Filled 2023-02-08: qty 3

## 2023-02-08 MED ORDER — DEXAMETHASONE SODIUM PHOSPHATE 10 MG/ML IJ SOLN
INTRAMUSCULAR | Status: AC
Start: 2023-02-08 — End: ?
  Filled 2023-02-08: qty 1

## 2023-02-08 MED ORDER — AZITHROMYCIN 250 MG PO TABS: ORAL_TABLET | ORAL | 0 refills | Status: DC

## 2023-02-08 MED ORDER — DEXAMETHASONE SODIUM PHOSPHATE 10 MG/ML IJ SOLN
10.0000 mg | Freq: Once | INTRAMUSCULAR | Status: AC
  Administered 2023-02-08: 10 mg via INTRAMUSCULAR

## 2023-02-08 MED ORDER — ALBUTEROL SULFATE (2.5 MG/3ML) 0.083% IN NEBU
2.5000 mg | INHALATION_SOLUTION | Freq: Once | RESPIRATORY_TRACT | Status: AC
  Administered 2023-02-08: 2.5 mg via RESPIRATORY_TRACT

## 2023-02-08 MED ORDER — ALBUTEROL SULFATE (2.5 MG/3ML) 0.083% IN NEBU: INHALATION_SOLUTION | RESPIRATORY_TRACT | 0 refills | Status: DC

## 2023-02-08 NOTE — ED Triage Notes (Signed)
Productive cough that is dark brown, wheezing, body aches, and diarrhea x 3 days. No known sick exposure.   Patient tried robitussin, Advair inhaler, cough drops, and vic rub with mild relief.

## 2023-02-08 NOTE — Discharge Instructions (Addendum)
X-ray done today and appears stable from previous x-rays but given the productive cough, shortness of breath and wheezing, we will treat with following:  Azithromycin 250mg  take 2 tablets today and 1 tablet daily for 4 days. Continue tessalon pearls as needed for cough Refill of the albuterol nebulizer treatments called in and use this every 6 hours as needed for wheezing, shortness of breath.  Rest and stay hydrated Return to urgent care or PCP if symptoms worsen or fail to resolve.

## 2023-02-08 NOTE — ED Provider Notes (Signed)
[facility]    CSN: [id number] Arrival date & time: 02/08/23  1011      History   Chief Complaint Chief Complaint  Patient presents with   Cough   Generalized Body Aches    HPI Frances Medina is a 62 y.o. female.   62 yr old female who presents to urgent care with complaints of wheezing, body aches and cough since Thursday. Also having nasal congestion. The cough is productive with dark, bloody thick mucus. Denies fevers or chills. No appetite but trying to eat and drink. Having a lot of diarrhea after eating. Has tried robitussin and her inhalers and tylenol and nothing has really helped.  She is also having shortness of breath.  She does have a history of myasthenia gravis and has a history of having difficulties with her airway.   Cough Associated symptoms: shortness of breath   Associated symptoms: no chest pain, no chills, no ear pain, no fever, no rash and no sore throat     Past Medical History:  Diagnosis Date   Acute respiratory failure (HCC)    Acute respiratory failure with hypoxemia (HCC)    Aspiration into airway    Back pain 07/31/2017   Benign neoplasm of rectum    CAP (community acquired pneumonia) 08/05/2015   Depression with anxiety    Diverticulosis    Dysphagia 03/2015    EGD, Dr Leone Payor. mild antral gastritis, ? due to Ibuprofen.  no stricture but empirically maloney dilated esophagus.    Dysphagia, neurologic    E. 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   GERD (gastroesophageal reflux disease)    hiatal hernia   Hypertension    "went away when I stopped smoking"   Knee injury    Left hip pain 02/18/2017   Migraine    "maybe couple times/month" (03/20/2015)    Mild intermittent asthma 06/22/2017   Myasthenia gravis (HCC) 2017   Myasthenia gravis with acute exacerbation (HCC) 05/15/2015   Osteoarthritis of left knee 12/02/2013   Osteoarthritis of right knee 08/30/2013   Pancreatitis 07/25/2017   Protein-calorie malnutrition, severe (HCC) 08/07/2015   Seasonal allergies    takes Zytrec   Tracheostomy status (HCC)    Tubular adenoma of colon    Tumors    "in my stomach"   Umbilical hernia    watching , no plans for surgery at present   Urine incontinence 10/19/2015   VAP (ventilator-associated pneumonia) [facility])     Patient Active Problem List   Diagnosis Date Noted   BMI 50.0-59.9, adult (HCC) 01/26/2023   Lumbar radiculopathy 12/03/2021   Positive ANA (antinuclear antibody) 08/26/2019   History of colonic polyps    Macromastia 06/17/2019   Seasonal allergies 02/18/2019   GAD (generalized anxiety disorder) 08/06/2018   Sinusitis 02/25/2018   Iron deficiency anemia 07/31/2017   Pre-diabetes 07/31/2017   Osteoarthritis of left hip 02/18/2017   OSA on CPAP 09/11/2016   Restrictive lung disease 10/11/2015   Major depression    Headache, migraine    Splenomegaly 04/07/2015   Myasthenia gravis (HCC)    Osteoarthritis of left knee 12/02/2013   Benign neoplasm of descending colon 09/19/2013   Osteoarthritis of right  knee 08/30/2013   GERD (gastroesophageal reflux disease) 06/23/2013   Fibroids 06/23/2013    Past Surgical History:  Procedure Laterality Date   BIOPSY  07/05/2019   Procedure: BIOPSY;  Surgeon: Meryl Dare, MD;  Location: [facility];  Service: Endoscopy;;   CESAREAN SECTION  1987; 1989   COLONOSCOPY WITH PROPOFOL N/A 09/19/2013   Procedure: COLONOSCOPY WITH PROPOFOL;  Surgeon: Meryl Dare, MD;  Location: [facility];  Service: Endoscopy;  Laterality: N/A;   COLONOSCOPY WITH PROPOFOL N/A 07/05/2019   Procedure: COLONOSCOPY WITH PROPOFOL;  Surgeon: Meryl Dare, MD;  Location: [facility];  Service: Endoscopy;   Laterality: N/A;   DILATION AND CURETTAGE OF UTERUS     ESOPHAGOGASTRODUODENOSCOPY N/A 08/10/2015   Procedure: ESOPHAGOGASTRODUODENOSCOPY (EGD);  Surgeon: Iva Boop, MD;  Location: [facility] ENDOSCOPY;  Service: Endoscopy;  Laterality: N/A;   ESOPHAGOGASTRODUODENOSCOPY (EGD) WITH PROPOFOL N/A 03/21/2015   Procedure: ESOPHAGOGASTRODUODENOSCOPY (EGD) WITH PROPOFOL;  Surgeon: Iva Boop, MD;  Location: [facility] ENDOSCOPY;  Service: Endoscopy;  Laterality: N/A;   ESOPHAGOGASTRODUODENOSCOPY (EGD) WITH PROPOFOL N/A 07/05/2019   Procedure: ESOPHAGOGASTRODUODENOSCOPY (EGD) WITH PROPOFOL;  Surgeon: Meryl Dare, MD;  Location: [facility];  Service: Endoscopy;  Laterality: N/A;   PARTIAL KNEE ARTHROPLASTY Right 08/30/2013   Procedure: RIGHT UNICOMPARTMENTAL KNEE;  Surgeon: Eulas Post, MD;  Location: [facility];  Service: Orthopedics;  Laterality: Right;   PARTIAL KNEE ARTHROPLASTY Left 12/02/2013   Procedure: LEFT KNEE UNI ARTHROPLASTY;  Surgeon: Eulas Post, MD;  Location: [facility];  Service: Orthopedics;  Laterality: Left;   PEG PLACEMENT N/A 08/10/2015   Procedure: PERCUTANEOUS ENDOSCOPIC GASTROSTOMY (PEG) PLACEMENT;  Surgeon: Iva Boop, MD;  Location: [facility] ENDOSCOPY;  Service: Endoscopy;  Laterality: N/A;   POLYPECTOMY  07/05/2019   Procedure: POLYPECTOMY;  Surgeon: Meryl Dare, MD;  Location: [facility];  Service: Endoscopy;;   TUBAL LIGATION  1989   VAGINAL HYSTERECTOMY  1990's?   "apparently took out one of my ovaries at the time too cause one's missing"    OB History   No obstetric history on file.      Home Medications    Prior to Admission medications   Medication Sig Start Date End Date Taking? Authorizing Provider  APPLE CIDER VINEGAR PO Take 1 capsule by mouth daily.   Yes [provider]  Ascorbic Acid (VITAMIN C PO) Take 1,000 mg by mouth daily.   Yes [provider]  cetirizine (ZYRTEC) 10 MG tablet Take 1 tablet (10 mg total) by mouth 2  (two) times daily as needed for allergies (hives). 03/14/22  Yes Patel, Ellen Henri, MD  clonazePAM (KLONOPIN) 0.5 MG tablet TAKE 1 TABLET BY MOUTH TWICE A DAY AS NEEDED FOR ANXIETY 01/21/23  Yes Janit Pagan T, MD  Cyanocobalamin (VITAMIN B12 PO) Take 1 tablet by mouth daily.   Yes [provider]  famotidine (PEPCID) 20 MG tablet Take 1 tablet (20 mg total) by mouth 2 (two) times daily as needed (hives). 03/14/22  Yes Birder Robson, MD  fluticasone (FLONASE) 50 MCG/ACT nasal spray Place 2 sprays into both nostrils daily. 03/14/22  Yes Patel, Ellen Henri, MD  fluticasone-salmeterol [facility] INHUB) 250-50 MCG/ACT AEPB Inhale 1 puff into the lungs in the morning and at bedtime. 08/22/22  Yes Martina Sinner, MD  gabapentin (NEURONTIN) 300 MG capsule TAKE 1 CAPSULE BY MOUTH TWICE A DAY 05/28/22  Yes Doreene Eland, MD  meloxicam (MOBIC) 15 MG tablet Take 1 tablet (15  mg total) by mouth daily. 11/18/22  Yes Louann Sjogren, DPM  Menthol 10 MG LOZG Take 1 lozenge by mouth daily as needed (cough).   Yes [provider]  metFORMIN (GLUCOPHAGE-XR) 500 MG 24 hr tablet Take 500 mg by mouth. 09/24/22 09/24/23 Yes [provider]  montelukast (SINGULAIR) 10 MG tablet TAKE 1 TABLET BY MOUTH EVERYDAY AT BEDTIME 11/07/22  Yes Eniola, Kehinde T, MD  mycophenolate (CELLCEPT) 500 MG tablet Take 500 mg by mouth 2 (two) times daily. 01/15/22  Yes [provider]  phentermine (ADIPEX-P) 37.5 MG tablet Take 18.75 mg by mouth every morning.   Yes [provider]  sertraline (ZOLOFT) 50 MG tablet Take 3 tablets (150 mg total) by mouth daily. 10/01/22  Yes Caro Laroche, DO  VITAMIN D, CHOLECALCIFEROL, PO Take 1 tablet by mouth daily. otc   Yes [provider]  acetaminophen (TYLENOL) 500 MG tablet Take 500 mg by mouth in the morning and at bedtime. Patient not taking: Reported on 01/26/2023    [provider]  albuterol (PROVENTIL) (2.5 MG/3ML) 0.083% nebulizer  solution USE 1 VIAL IN NEBULIZER EVERY 6 HOURS AS NEEDED FOR SHORTNESS OF BREATH AND WHEEZING Patient not taking: Reported on 01/26/2023 09/23/18   Doreene Eland, MD  albuterol (VENTOLIN HFA) 108 (90 Base) MCG/ACT inhaler INHALE 2 PUFFS INTO THE LUNGS EVERY 6 HOURS AS NEEDED FOR WHEEZE OR SHORTNESS OF BREATH Patient not taking: Reported on 01/26/2023 01/18/21   Doreene Eland, MD  baclofen (LIORESAL) 10 MG tablet TAKE 1 TABLET BY MOUTH AT BEDTIME AS NEEDED FOR MUSCLE SPASMS. Patient not taking: Reported on 01/26/2023 08/25/22   Doreene Eland, MD  benzonatate (TESSALON) 100 MG capsule TAKE 1 CAPSULE BY MOUTH TWICE A DAY AS NEEDED FOR COUGH Patient not taking: Reported on 01/26/2023 04/26/21   Doreene Eland, MD  nystatin (MYCOSTATIN/NYSTOP) powder APPLY TOPICALLY 3 TIMES DAILY AS NEEDED. Patient not taking: Reported on 01/26/2023 11/06/22   Doreene Eland, MD  omeprazole (PRILOSEC) 20 MG capsule TAKE ONE TABLET DAILY AS NEEDED Patient not taking: Reported on 01/26/2023 09/29/22   Doreene Eland, MD    Family History Family History  Problem Relation Age of Onset   Heart disease Mother 47   Hypertension Mother    Stroke Father    Hypertension Father    Atopy Sister    Allergic rhinitis Sister    Other Sister        Esophageal strictures   Allergic rhinitis Sister    Anemia Sister    Leukemia Sister    Leukemia Sister    COPD Brother    Colon cancer Neg Hx     Social History Social History   Tobacco Use   Smoking status: Former    Current packs/day: 0.00    Average packs/day: 1.5 packs/day for 38.2 years (57.3 ttl pk-yrs)    Types: Cigarettes    Start date: 03/03/1974    Quit date: 05/14/2012    Years since quitting: 10.7   Smokeless tobacco: Never   Tobacco comments:    denies thoughts of restarting  Vaping Use   Vaping status: Former  Substance Use Topics   Alcohol use: No    Alcohol/week: 0.0 standard drinks of alcohol   Drug use: No     Allergies    Ciprofloxacin, Naproxen, Shrimp [shellfish allergy], Dicyclomine, Methocarbamol, Pineapple, and Morphine and codeine   Review of Systems Review of Systems  Constitutional:  Negative for chills and  fever.  HENT:  Positive for congestion. Negative for ear pain and sore throat.   Eyes:  Negative for pain and visual disturbance.  Respiratory:  Positive for cough (productive) and shortness of breath.   Cardiovascular:  Negative for chest pain and palpitations.  Gastrointestinal:  Positive for diarrhea. Negative for abdominal pain and vomiting.  Genitourinary:  Negative for dysuria and hematuria.  Musculoskeletal:  Negative for arthralgias and back pain.       Generalized body aches  Skin:  Negative for color change and rash.  Neurological:  Negative for seizures and syncope.  All other systems reviewed and are negative.    Physical Exam Triage Vital Signs ED Triage Vitals  Encounter Vitals Group     BP 02/08/23 1048 (!) 149/90     Systolic BP Percentile --      Diastolic BP Percentile --      Pulse Rate 02/08/23 1048 79     Resp 02/08/23 1048 20     Temp 02/08/23 1048 98 F (36.7 C)     Temp Source 02/08/23 1048 Oral     SpO2 02/08/23 1048 94 %     Weight 02/08/23 1048 280 lb (127 kg)     Height 02/08/23 1048 5' (1.524 m)     Head Circumference --      Peak Flow --      Pain Score 02/08/23 1046 10     Pain Loc --      Pain Education --      Exclude from Growth Chart --    No data found.  Updated Vital Signs BP (!) 149/90 (BP Location: Left Wrist)   Pulse 79   Temp 98 F (36.7 C) (Oral)   Resp 20   Ht 5' (1.524 m)   Wt 280 lb (127 kg)   SpO2 94%   BMI 54.68 kg/m   Visual Acuity Right Eye Distance:   Left Eye Distance:   Bilateral Distance:    Right Eye Near:   Left Eye Near:    Bilateral Near:     Physical Exam Vitals and nursing note reviewed.  Constitutional:      General: She is not in acute distress.    Appearance: She is well-developed.  HENT:      Head: Normocephalic and atraumatic.  Eyes:     Conjunctiva/sclera: Conjunctivae normal.  Cardiovascular:     Rate and Rhythm: Normal rate and regular rhythm.     Heart sounds: No murmur heard. Pulmonary:     Effort: Pulmonary effort is normal. No respiratory distress.     Breath sounds: Examination of the right-upper field reveals decreased breath sounds and wheezing. Examination of the right-middle field reveals decreased breath sounds and wheezing. Examination of the right-lower field reveals decreased breath sounds. Decreased breath sounds and wheezing present. No rhonchi or rales.  Abdominal:     Palpations: Abdomen is soft.     Tenderness: There is no abdominal tenderness.  Musculoskeletal:        General: No swelling.     Cervical back: Neck supple.  Skin:    General: Skin is warm and dry.     Capillary Refill: Capillary refill takes less than 2 seconds.  Neurological:     Mental Status: She is alert.  Psychiatric:        Mood and Affect: Mood normal.      UC Treatments / Results  Labs (all labs ordered are listed, but only abnormal results are displayed)  Labs Reviewed - No data to display  EKG   Radiology No results found.  Procedures Procedures (including critical care time)  Medications Ordered in UC Medications - No data to display  Initial Impression / Assessment and Plan / UC Course  I have reviewed the triage vital signs and the nursing notes.  Pertinent labs & imaging results that were available during my care of the patient were reviewed by me and considered in my medical decision making (see chart for details).     COPD exacerbation (HCC)   X-ray done today and appears stable from previous x-rays but given the productive cough, shortness of breath and wheezing along with the chronic lung condition and chronic inflammatory condition, we will treat with following:  Azithromycin 250mg  take 2 tablets today and 1 tablet daily for 4 days. Continue  tessalon pearls as needed for cough Refill of the albuterol nebulizer treatments called in and use this every 6 hours as needed for wheezing, shortness of breath.  Rest and stay hydrated Return to urgent care or PCP if symptoms worsen or fail to resolve.    Final Clinical Impressions(s) / UC Diagnoses   Final diagnoses:  None   Discharge Instructions   None    ED Prescriptions   None    PDMP not reviewed this encounter.   Landis Martins, [place] 02/08/23 1207

## 2023-02-09 ENCOUNTER — Ambulatory Visit: Payer: BC Managed Care – PPO | Admitting: Physical Therapy

## 2023-02-10 ENCOUNTER — Other Ambulatory Visit: Payer: Self-pay | Admitting: *Deleted

## 2023-02-11 ENCOUNTER — Ambulatory Visit (INDEPENDENT_AMBULATORY_CARE_PROVIDER_SITE_OTHER): Payer: BC Managed Care – PPO | Admitting: Student

## 2023-02-11 ENCOUNTER — Encounter: Payer: Self-pay | Admitting: Student

## 2023-02-11 ENCOUNTER — Ambulatory Visit: Payer: BC Managed Care – PPO | Admitting: Physical Therapy

## 2023-02-11 VITALS — BP 154/91 | HR 84 | Ht 60.0 in | Wt 293.5 lb

## 2023-02-11 DIAGNOSIS — R051 Acute cough: Secondary | ICD-10-CM | POA: Diagnosis not present

## 2023-02-11 DIAGNOSIS — Z23 Encounter for immunization: Secondary | ICD-10-CM

## 2023-02-11 MED ORDER — PROMETHAZINE-DM 6.25-15 MG/5ML PO SYRP: 2.5000 mL | ORAL_SOLUTION | Freq: Four times a day (QID) | ORAL | 0 refills | Status: DC | PRN

## 2023-02-11 MED ORDER — DOXYCYCLINE HYCLATE 100 MG PO TABS: 100.0000 mg | ORAL_TABLET | Freq: Two times a day (BID) | ORAL | 0 refills | Status: AC

## 2023-02-11 NOTE — Patient Instructions (Addendum)
It was great to see you today! Thank you for choosing [facility] for your primary care.  Today we addressed: Doxycycline twice a day for seven days. Please take with food  You can use the Promethazine DM for cough up to four times a day if needed  We will call with the respiratory panel results  Please call if not improved by end of the week   If you haven't already, sign up for My Chart to have easy access to your labs results, and communication with your primary care physician.  Please arrive 15 minutes before your appointment to ensure smooth check in process.  We appreciate your efforts in making this happen.  Thank you for allowing me to participate in your care, Alfredo Martinez, MD 02/11/2023, 9:37 AM PGY-3, [facility]

## 2023-02-11 NOTE — Progress Notes (Unsigned)
    SUBJECTIVE:   CHIEF COMPLAINT / HPI:   Cough  Body Aches:  Patient presents for complaint of wheezing, body aches, cough has been ongoing since last Thursday.  She reports the cough is dark, bloody, thick mucus.  She was seen at urgent care and was given treatment with azithromycin and Tessalon Pearls. Got a dose of dexamethasone and albuterol in Urgent Care.   Has Myasthenia Gravis, on cellcept.   Not much has changed from UC visit, still has cough with lightheadedness and headaches since onset of symptoms.   At night, she gives herself her albuterol treatment with minimal relief   PERTINENT  PMH / PSH:  Myasthenia gravis, allergic rhinosinusitis, OSA on CPAP, shellfish and environmental allergies  OBJECTIVE:   BP (!) 154/91   Pulse 84   Ht 5' (1.524 m)   Wt 293 lb 8 oz (133.1 kg)   SpO2 97%   BMI 57.32 kg/m   General: Alert and oriented in no apparent distress Heart: Regular rate and rhythm with no murmurs appreciated Lungs: Coarse throughout with decreased lung sounds in all fields  Abdomen:no abdominal pain Skin: Warm and dry   ASSESSMENT/PLAN:   Assessment & Plan Acute cough Possible viral or bacterial upper respiratory tract infection. Treating for bacterial bronchitis with doxy for now although unsure if bacterial in nature. Unlikely PNA with no focal diminishment on exam or systemic symptoms.  Given the patient's history, will be cautious and treat with antibiotics.  Additionally, continue with Promethazine DM, may need stronger cough medicine if not helpful.  Will also order RVP to assess for possibility of viral insult to her symptoms.  Strict return precautions with very close follow-up with PCP if symptoms persist.  High risk patient as she has a history of hospitalizations and intubations in the setting of myasthenia gravis.  Reassured as the patient is on room air with normal oxygen status.  Patient does have a slight elevation in blood pressure.  This is  likely due to her acute illness, instructed the patient to return for follow-up with PCP for recheck of blood pressure.     Alfredo Martinez, MD [facility]

## 2023-02-13 LAB — OTHER LAB TEST

## 2023-02-13 NOTE — Progress Notes (Signed)
  Care Coordination Note  02/13/2023 Name: Frances Medina MRN: [id number] DOB: 11/16/1960  Frances Medina is a 61 y.o. year old female who is a primary care patient of Doreene Eland, MD and is actively engaged with the care management team. I reached out to Frances Medina by phone today to assist with re-scheduling a follow up visit with the RN Case Manager  Follow up plan: Telephone appointment with care management team member scheduled for:02/24/23  [facility] Coordination Care Guide  Direct Dial: [phone]

## 2023-02-14 ENCOUNTER — Telehealth: Payer: Self-pay | Admitting: Family Medicine

## 2023-02-14 NOTE — Telephone Encounter (Signed)
Viral respiratory panel result discussed with the patient and her daughter.  COVID-19 (SARS-CoV-2) negative  Positive Coronavirus NL63 - as discussed with the patient, this typically causes respiratory symptoms in children. However, she has poor immune status due to her chronic medication, hence the reason for this infection in her. Treatment is conservative, but can give oral steroid if not doing better. She stated the cough medication she was given was very effective and she is much better. We will continue to monitor for now. Return precautions discussed. She was appreciative of the call and verbalized understanding.

## 2023-02-16 ENCOUNTER — Ambulatory Visit: Payer: BC Managed Care – PPO | Admitting: Physical Therapy

## 2023-02-18 ENCOUNTER — Ambulatory Visit: Payer: BC Managed Care – PPO | Admitting: Physical Therapy

## 2023-02-20 ENCOUNTER — Encounter: Payer: BC Managed Care – PPO | Admitting: Physical Therapy

## 2023-02-21 LAB — RESPIRATORY PANEL W/ SARS-COV2
Adenovirus: NOT DETECTED
Bordetella parapertussis: NOT DETECTED
Bordetella pertussis: NOT DETECTED
Chlamydophila pneumoniae: NOT DETECTED
Coronavirus 229E: NOT DETECTED
Coronavirus HKU1: NOT DETECTED
Coronavirus NL63: DETECTED — AB
Coronavirus OC43: NOT DETECTED
Human Metapneumovirus: NOT DETECTED
Human Rhinovirus/Enterovirus: NOT DETECTED
Influenza A/H1-2009: NOT DETECTED
Influenza A/H1: NOT DETECTED
Influenza A/H3: NOT DETECTED
Influenza A: NOT DETECTED
Influenza B: NOT DETECTED
Mycoplasma pneumoniae: NOT DETECTED
Parainfluenza 1: NOT DETECTED
Parainfluenza 2: NOT DETECTED
Parainfluenza 3: NOT DETECTED
Parainfluenza 4: NOT DETECTED
Respiratory Syncytial Virus: NOT DETECTED
SARS-CoV-2: NOT DETECTED

## 2023-02-24 ENCOUNTER — Other Ambulatory Visit: Payer: Self-pay | Admitting: *Deleted

## 2023-02-24 ENCOUNTER — Telehealth: Payer: Self-pay | Admitting: *Deleted

## 2023-02-24 NOTE — Patient Outreach (Signed)
  Care Management   Follow Up Note   02/24/2023 Name: Frances Medina MRN: [id number] DOB: 10-14-1960   Referred by: Doreene Eland, MD Reason for referral : Care Management (RNCM: Attempt to follow up for Chronic Disease Management & Care Coordination Needs)   An unsuccessful telephone outreach was attempted today. The patient was referred to the case management team for assistance with care management and care coordination.   Follow Up Plan: The care management team will reach out to the patient again over the next 30 days.   Danise Edge, BSN RN RN Care Manager  [place]  [facility]  Direct Number: [phone]

## 2023-03-03 ENCOUNTER — Ambulatory Visit (INDEPENDENT_AMBULATORY_CARE_PROVIDER_SITE_OTHER): Payer: BC Managed Care – PPO | Admitting: Podiatry

## 2023-03-03 ENCOUNTER — Encounter: Payer: Self-pay | Admitting: Podiatry

## 2023-03-03 DIAGNOSIS — M7662 Achilles tendinitis, left leg: Secondary | ICD-10-CM | POA: Diagnosis not present

## 2023-03-03 NOTE — Progress Notes (Signed)
  Subjective:  Patient ID: Frances Medina, female    DOB: 1960-07-27,   MRN: [id number]  Chief Complaint  Patient presents with   Routine Post Op    Patient states everything has been ok until she caught Covid , by her not been able to go to therapy she has been having knee problems because of the achilles.     62 y.o. female presents for follow-up of left achilles tendonitis. Relates as above.  Denies any other pedal complaints. Denies n/v/f/c.   Past Medical History:  Diagnosis Date   Acute respiratory failure (HCC)    Acute respiratory failure with hypoxemia (HCC)    Aspiration into airway    Back pain 07/31/2017   Benign neoplasm of rectum    CAP (community acquired pneumonia) 08/05/2015   Depression with anxiety    Diverticulosis    Dysphagia 03/2015    EGD, Dr Avram. mild antral gastritis, ? due to Ibuprofen .  no stricture but empirically maloney dilated esophagus.    Dysphagia, neurologic    E. 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   GERD (gastroesophageal reflux disease)    hiatal hernia   Hypertension    went away when I stopped smoking   Knee injury    Left hip pain 02/18/2017   Migraine    maybe couple times/month (03/20/2015)   Mild intermittent asthma 06/22/2017   Myasthenia gravis (HCC) 2017   Myasthenia gravis with acute exacerbation (HCC) 05/15/2015   Osteoarthritis of left knee 12/02/2013   Osteoarthritis of right knee 08/30/2013   Pancreatitis 07/25/2017   Protein-calorie malnutrition, severe (HCC) 08/07/2015   Seasonal allergies    takes Zytrec   Tracheostomy status (HCC)    Tubular adenoma of colon    Tumors    in my stomach   Umbilical hernia    watching , no plans for  surgery at present   Urine incontinence 10/19/2015   VAP (ventilator-associated pneumonia) (HCC)     Objective:  Physical Exam: Vascular: DP/PT pulses 2/4 bilateral. CFT <3 seconds. Normal hair growth on digits. No edema.  Skin. No lacerations or abrasions bilateral feet.  Musculoskeletal: MMT 5/5 bilateral lower extremities in DF, PF, Inversion and Eversion. Deceased ROM in DF of ankle joint. Tender to insertion of left achilles. Some pain with DF and PF. No pain along peroneals or PT tendon.  Neurological: Sensation intact to light touch.   Assessment:   1. Tendonitis, Achilles, left       Plan:  Patient was evaluated and treated and all questions answered. -X-ray reviewed and noted spurring to posterior calcaneus  -Discussed Achilles insertional tendonitis and treatment options with patient.  -Continue stretching and anit-inflammatories.  -Will continue back with PT when able.  -Discussed if no improvement will consider MRI/EPAT/PRP injections.  -Patient to return as needed   Asberry Failing, DPM

## 2023-03-11 ENCOUNTER — Telehealth: Payer: Self-pay | Admitting: Internal Medicine

## 2023-03-11 ENCOUNTER — Encounter: Payer: Self-pay | Admitting: Family Medicine

## 2023-03-11 DIAGNOSIS — J4520 Mild intermittent asthma, uncomplicated: Secondary | ICD-10-CM

## 2023-03-11 NOTE — Progress Notes (Signed)
 DME form for CPAP completed based on CPAP settings on file CPAP machine pressure per recent sleep study = 12 cmH2O RECOMMENDATIONS - Trial of CPAP therapy on 12 cm H2O or autopap 5-15. - Patient used a Small size Resmed Full Face Mask AirFit 20 for Her mask and heated humidification.   I called and spoke with her. She confirms she does not have a new settings. However, she is scheduled to see sleep specialist soon.  Completed form placed in the front office to be faxed.

## 2023-03-11 NOTE — Telephone Encounter (Signed)
 Per AVS please order PFT

## 2023-03-13 ENCOUNTER — Encounter: Payer: Self-pay | Admitting: Family Medicine

## 2023-03-13 ENCOUNTER — Ambulatory Visit (INDEPENDENT_AMBULATORY_CARE_PROVIDER_SITE_OTHER): Payer: 59 | Admitting: Family Medicine

## 2023-03-13 VITALS — BP 130/56 | HR 82 | Ht 60.0 in | Wt 294.8 lb

## 2023-03-13 DIAGNOSIS — Z0000 Encounter for general adult medical examination without abnormal findings: Secondary | ICD-10-CM | POA: Diagnosis not present

## 2023-03-13 DIAGNOSIS — Z6841 Body Mass Index (BMI) 40.0 and over, adult: Secondary | ICD-10-CM

## 2023-03-13 DIAGNOSIS — D509 Iron deficiency anemia, unspecified: Secondary | ICD-10-CM

## 2023-03-13 DIAGNOSIS — M25562 Pain in left knee: Secondary | ICD-10-CM | POA: Diagnosis not present

## 2023-03-13 DIAGNOSIS — Z122 Encounter for screening for malignant neoplasm of respiratory organs: Secondary | ICD-10-CM | POA: Diagnosis not present

## 2023-03-13 DIAGNOSIS — K219 Gastro-esophageal reflux disease without esophagitis: Secondary | ICD-10-CM | POA: Diagnosis not present

## 2023-03-13 DIAGNOSIS — M25561 Pain in right knee: Secondary | ICD-10-CM

## 2023-03-13 DIAGNOSIS — G8929 Other chronic pain: Secondary | ICD-10-CM

## 2023-03-13 DIAGNOSIS — R0602 Shortness of breath: Secondary | ICD-10-CM

## 2023-03-13 DIAGNOSIS — M25569 Pain in unspecified knee: Secondary | ICD-10-CM | POA: Insufficient documentation

## 2023-03-13 DIAGNOSIS — I10 Essential (primary) hypertension: Secondary | ICD-10-CM

## 2023-03-13 DIAGNOSIS — R42 Dizziness and giddiness: Secondary | ICD-10-CM | POA: Insufficient documentation

## 2023-03-13 DIAGNOSIS — R7309 Other abnormal glucose: Secondary | ICD-10-CM | POA: Diagnosis not present

## 2023-03-13 DIAGNOSIS — Z87891 Personal history of nicotine dependence: Secondary | ICD-10-CM | POA: Diagnosis not present

## 2023-03-13 DIAGNOSIS — R1310 Dysphagia, unspecified: Secondary | ICD-10-CM

## 2023-03-13 HISTORY — DX: Dizziness and giddiness: R42

## 2023-03-13 LAB — POCT GLYCOSYLATED HEMOGLOBIN (HGB A1C): HbA1c, POC (prediabetic range): 5.9 % (ref 5.7–6.4)

## 2023-03-13 NOTE — Assessment & Plan Note (Signed)
 Worsening despite compliance with Omeprazole ?? Worsening of her Myasthenia Gravis symptoms Most recent EGD and colonoscopy reviewed Continue Omeprazole Avoid NSAID She is interested in f/u with GI Referral placed

## 2023-03-13 NOTE — Progress Notes (Signed)
 Subjective:   Frances Medina is a 63 y.o. female who presents for Medicare Annual (Subsequent) preventive examination.  Method of visit: In-person   Review of Systems:  B/L knee pain: Chronic. Uses Tylenol  as needed for pain. No new trauma to her knees. Pain is worse on the right compared to her left knees.  Cough/SOB: She has been having issues with cough since she had the corona virus infection a few weeks ago. Her breathing is worse with ambulation and sometimes rest. She worries her myasthenia gravis is worsening. She has pulmonology appointment in 2 months.  Reflux: Feels like her reflux is worsening as well. Whenever she eats certain food, it feels like it chokes in her throat. She uses omeprazole  with minimal improvement.  Dizziness: C/O dizziness when in the shower. She denies falls. She has shower stool, but does not use this often.       Objective:     Vitals: BP (!) 130/56   Pulse 82   Ht 5' (1.524 m)   Wt 294 lb 12.8 oz (133.7 kg)   SpO2 100%   BMI 57.57 kg/m   Body mass index is 57.57 kg/m. Physical Exam Vitals reviewed.  Constitutional:      Appearance: Normal appearance.     Comments: Obese Gait slow but steady with a walking cane  Cardiovascular:     Rate and Rhythm: Normal rate and regular rhythm.     Heart sounds: Normal heart sounds. No murmur heard. Pulmonary:     Effort: Pulmonary effort is normal. No respiratory distress.     Breath sounds: Normal breath sounds. No wheezing or rhonchi.        03/13/2023    2:18 PM 03/13/2023   11:17 AM 03/13/2023   11:10 AM 01/26/2023    8:53 AM 01/13/2023    8:48 AM 03/11/2022    8:50 AM 01/27/2022    2:25 PM  Advanced Directives  Does Patient Have a Medical Advance Directive? No No No No No No No  Would patient like information on creating a medical advance directive? Yes (MAU/Ambulatory/Procedural Areas - Information given) No - Patient declined No - Patient declined No - Patient declined No - Patient  declined No - Patient declined No - Patient declined    Tobacco Social History   Tobacco Use  Smoking Status Former   Current packs/day: 0.00   Average packs/day: 1.5 packs/day for 38.2 years (57.3 ttl pk-yrs)   Types: Cigarettes   Start date: 03/03/1974   Quit date: 05/14/2012   Years since quitting: 10.8  Smokeless Tobacco Never  Tobacco Comments   denies thoughts of restarting     Counseling given: Not Answered Tobacco comments: denies thoughts of restarting   Clinical Intake:  Pre-visit preparation completed: Yes  Pain Score: 10-Worst pain ever (B/L knee pain, more on the right)     BMI - recorded: 57.5 Nutritional Status: BMI > 30  Obese Diabetes: No  How often do you need to have someone help you when you read instructions, pamphlets, or other written materials from your doctor or pharmacy?: 1 - Never What is the last grade level you completed in school?: 12TH GRADE  Interpreter Needed?: No     Past Medical History:  Diagnosis Date   Acute respiratory failure (HCC)    Acute respiratory failure with hypoxemia (HCC)    Aspiration into airway    Back pain 07/31/2017   Benign neoplasm of rectum    CAP (community acquired pneumonia)  08/05/2015   Depression with anxiety    Diverticulosis    Dysphagia 03/2015    EGD, Dr Avram. mild antral gastritis, ? due to Ibuprofen .  no stricture but empirically maloney dilated esophagus.    Dysphagia, neurologic    E. 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   GERD (gastroesophageal reflux disease)    hiatal hernia   Hypertension    went away when I stopped smoking   Knee injury    Left hip pain 02/18/2017   Migraine    maybe couple  times/month (03/20/2015)   Mild intermittent asthma 06/22/2017   Myasthenia gravis (HCC) 2017   Myasthenia gravis with acute exacerbation (HCC) 05/15/2015   Osteoarthritis of left knee 12/02/2013   Osteoarthritis of right knee 08/30/2013   Pancreatitis 07/25/2017   Protein-calorie malnutrition, severe (HCC) 08/07/2015   Seasonal allergies    takes Zytrec   Tracheostomy status (HCC)    Tubular adenoma of colon    Tumors    in my stomach   Umbilical hernia    watching , no plans for surgery at present   Urine incontinence 10/19/2015   VAP (ventilator-associated pneumonia) [facility])    Past Surgical History:  Procedure Laterality Date   BIOPSY  07/05/2019   Procedure: BIOPSY;  Surgeon: Aneita [name], MD;  Location: [facility];  Service: Endoscopy;;   CESAREAN SECTION  1987; 1989   COLONOSCOPY WITH PROPOFOL  N/A 09/19/2013   Procedure: COLONOSCOPY WITH PROPOFOL ;  Surgeon: [name] Aneita, MD;  Location: [facility];  Service: Endoscopy;  Laterality: N/A;   COLONOSCOPY WITH PROPOFOL  N/A 07/05/2019   Procedure: COLONOSCOPY WITH PROPOFOL ;  Surgeon: Aneita [name], MD;  Location: [facility];  Service: Endoscopy;  Laterality: N/A;   DILATION AND CURETTAGE OF UTERUS     ESOPHAGOGASTRODUODENOSCOPY N/A 08/10/2015   Procedure: ESOPHAGOGASTRODUODENOSCOPY (EGD);  Surgeon: Lupita FORBES Avram, MD;  Location[facility] ENDOSCOPY;  Service: Endoscopy;  Laterality: N/A;   ESOPHAGOGASTRODUODENOSCOPY (EGD) WITH PROPOFOL  N/A 03/21/2015   Procedure: ESOPHAGOGASTRODUODENOSCOPY (EGD) WITH PROPOFOL ;  Surgeon: Lupita FORBES Avram, MD;  Location: [facility] ENDOSCOPY;  Service: Endoscopy;  Laterality: N/A;   ESOPHAGOGASTRODUODENOSCOPY (EGD) WITH PROPOFOL  N/A 07/05/2019   Procedure: ESOPHAGOGASTRODUODENOSCOPY (EGD) WITH PROPOFOL ;  Surgeon: Aneita [name], MD;  Location: [facility];  Service: Endoscopy;  Laterality: N/A;   PARTIAL KNEE ARTHROPLASTY Right 08/30/2013   Procedure: RIGHT UNICOMPARTMENTAL KNEE;  Surgeon: Fonda SHAUNNA Olmsted, MD;   Location: [facility] OR;  Service: Orthopedics;  Laterality: Right;   PARTIAL KNEE ARTHROPLASTY Left 12/02/2013   Procedure: LEFT KNEE UNI ARTHROPLASTY;  Surgeon: Fonda SHAUNNA Olmsted, MD;  Location: [facility];  Service: Orthopedics;  Laterality: Left;   PEG PLACEMENT N/A 08/10/2015   Procedure: PERCUTANEOUS ENDOSCOPIC GASTROSTOMY (PEG) PLACEMENT;  Surgeon: Lupita FORBES Avram, MD;  Location: [facility] END[facility]COPY;  Service: Endoscopy;  Laterality: N/A;   POLYPECTOMY  07/05/2019   Procedure: POLYPECTOMY;  Surgeon: Aneita [name], MD;  Location: [facility];  Service: Endoscopy;;   TUBAL LIGATION  1989   VAGINAL HYSTERECTOMY  1990's?   apparently took out one of my ovaries at the time too cause one's missing   Family History  Problem Relation Age of Onset   Heart disease  Mother 49   Hypertension Mother    Stroke Father    Hypertension Father    Atopy Sister    Allergic rhinitis Sister    Other Sister        Esophageal strictures   Allergic rhinitis Sister    Anemia Sister    Leukemia Sister    Leukemia Sister    COPD Brother    Colon cancer Neg Hx    Social History   Socioeconomic History   Marital status: Married    Spouse name: Not on file   Number of children: 3   Years of education: Not on file   Highest education level: Not on file  Occupational History   Occupation: disabled  Tobacco Use   Smoking status: Former    Current packs/day: 0.00    Average packs/day: 1.5 packs/day for 38.2 years (57.3 ttl pk-yrs)    Types: Cigarettes    Start date: 03/03/1974    Quit date: 05/14/2012    Years since quitting: 10.8   Smokeless tobacco: Never   Tobacco comments:    denies thoughts of restarting  Vaping Use   Vaping status: Former  Substance and Sexual Activity   Alcohol use: No    Alcohol/week: 0.0 standard drinks of alcohol   Drug use: No   Sexual activity: Not Currently    Birth control/protection: Surgical  Other Topics Concern   Not on file  Social History Narrative    Work or School: [employer] - [employer] Situation: lives with husband and daughters      Spiritual Beliefs: Christian      Lifestyle: no regular exercise; trying to eat healthy      [facility] (09/11/16):   Originally from [facility] [place] [facility]. Has always lived in [name]. No pets currently. Does have carpet in her home in her bedroom. No bird exposure. Previously had mold under her kitchen sink. No indoor plants. Previously was working in [employer].          Social Drivers of Corporate Investment Banker Strain: Not on file  Food Insecurity: No Food Insecurity (03/10/2022)   Hunger Vital Sign    Worried About Running Out of Food in the Last Year: Never true    Ran Out of Food in the Last Year: Never true  Transportation Needs: No Transportation Needs (03/10/2022)   PRAPARE - Administrator, Civil Service (Medical): No    Lack of Transportation (Non-Medical): No  Physical Activity: Insufficiently Active (03/10/2022)   Exercise Vital Sign    Days of Exercise per Week: 2 days    Minutes of Exercise per Session: 60 min  Stress: Not on file  Social Connections: Not on file    Outpatient Encounter Medications as of 03/13/2023  Medication Sig   cetirizine  (ZYRTEC ) 10 MG tablet Take 1 tablet (10 mg total) by mouth 2 (two) times daily as needed for allergies (hives).   clonazePAM  (KLONOPIN ) 0.5 MG tablet TAKE 1 TABLET BY MOUTH TWICE A DAY AS NEEDED FOR ANXIETY   Cyanocobalamin  (VITAMIN B12 PO) Take 1 tablet by mouth daily.   fluticasone  (FLONASE ) 50 MCG/ACT nasal spray Place 2 sprays into both nostrils daily.   fluticasone -salmeterol (WIXELA INHUB) 250-50 MCG/ACT AEPB Inhale 1 puff into the lungs in the morning and at bedtime.   gabapentin  (NEURONTIN ) 300 MG capsule TAKE 1 CAPSULE BY MOUTH TWICE A DAY   metFORMIN  (GLUCOPHAGE -XR) 500 MG 24 hr tablet Take  500 mg by mouth.   montelukast  (SINGULAIR ) 10 MG tablet TAKE 1 TABLET BY MOUTH EVERYDAY AT BEDTIME    mycophenolate  (CELLCEPT ) 500 MG tablet Take 500 mg by mouth 2 (two) times daily.   omeprazole  (PRILOSEC) 20 MG capsule TAKE ONE TABLET DAILY AS NEEDED   phentermine  (ADIPEX-P ) 37.5 MG tablet Take 18.75 mg by mouth every morning.   sertraline  (ZOLOFT ) 50 MG tablet Take 3 tablets (150 mg total) by mouth daily.   VITAMIN D , CHOLECALCIFEROL, PO Take 1 tablet by mouth daily. otc   acetaminophen  (TYLENOL ) 500 MG tablet Take 500 mg by mouth in the morning and at bedtime. (Patient not taking: Reported on 03/13/2023)   albuterol  (PROVENTIL ) (2.5 MG/3ML) 0.083% nebulizer solution USE 1 VIAL IN NEBULIZER EVERY 6 HOURS AS NEEDED FOR SHORTNESS OF BREATH AND WHEEZING (Patient not taking: Reported on 03/13/2023)   albuterol  (VENTOLIN  HFA) 108 (90 Base) MCG/ACT inhaler INHALE 2 PUFFS INTO THE LUNGS EVERY 6 HOURS AS NEEDED FOR WHEEZE OR SHORTNESS OF BREATH (Patient not taking: Reported on 03/13/2023)   APPLE CIDER VINEGAR PO Take 1 capsule by mouth daily. (Patient not taking: Reported on 03/13/2023)   Ascorbic Acid (VITAMIN C PO) Take 1,000 mg by mouth daily. (Patient not taking: Reported on 03/13/2023)   baclofen  (LIORESAL ) 10 MG tablet TAKE 1 TABLET BY MOUTH AT BEDTIME AS NEEDED FOR MUSCLE SPASMS. (Patient not taking: Reported on 03/13/2023)   benzonatate  (TESSALON ) 100 MG capsule TAKE 1 CAPSULE BY MOUTH TWICE A DAY AS NEEDED FOR COUGH (Patient not taking: Reported on 03/13/2023)   famotidine  (PEPCID ) 20 MG tablet Take 1 tablet (20 mg total) by mouth 2 (two) times daily as needed (hives). (Patient not taking: Reported on 03/13/2023)   meloxicam  (MOBIC ) 15 MG tablet Take 1 tablet (15 mg total) by mouth daily. (Patient not taking: Reported on 03/13/2023)   Menthol  10 MG LOZG Take 1 lozenge by mouth daily as needed (cough). (Patient not taking: Reported on 03/13/2023)   nystatin  (MYCOSTATIN /NYSTOP ) powder APPLY TOPICALLY 3 TIMES DAILY AS NEEDED. (Patient not taking: Reported on 03/13/2023)   promethazine -dextromethorphan  (PROMETHAZINE -DM) 6.25-15 MG/5ML syrup Take 2.5 mLs by mouth 4 (four) times daily as needed for cough. (Patient not taking: Reported on 03/13/2023)   [DISCONTINUED] azithromycin  (ZITHROMAX ) 250 MG tablet Take first 2 tablets together, then 1 every day until finished.   No facility-administered encounter medications on file as of 03/13/2023.    Activities of Daily Living    03/13/2023   11:32 AM  In your present state of health, do you have any difficulty performing the following activities:  Hearing? 0  Vision? 0  Difficulty concentrating or making decisions? 0  Walking or climbing stairs? 1  Dressing or bathing? 0  Doing errands, shopping? 0    Patient Care Team: Anders Otto DASEN, MD as PCP - General (Family Medicine) Loni Soyla LABOR, MD as PCP - Cardiology (Cardiology) Wonda Cy BROCKS, RD as Dietitian (Family Medicine) Bertrum Rosina HERO, RN as [facility] Care Management (General Practice)    Assessment:   This is a routine wellness examination for Makeshia.  Exercise Activities and Dietary recommendations     Goals       I want to continue on my weight loss journey and monitor my breathing (pt-stated)      Patient Goals/Self Care Activities: -Patient/Caregiver will take medications as prescribed   -Patient/Caregiver will attend all scheduled provider appointments -Patient/Caregiver will call pharmacy for medication refills 3-7 days in advance of running out of  medications -Patient/Caregiver will call provider office for new concerns or questions  -Patient/Caregiver will focus on medication adherence by taking medications as prescribed   -Avoid smoke and air pollution -Keep your airway clear from mucus build up  -Use a humidifier, if needed -Utilize infection prevention strategies to reduce risk of respiratory infection   - stock up on healthy food choices - drink 6 to 8 glasses of water  each day -Stay in the cool when you can for your breathing       Weight (lb) < 200 lb  (90.7 kg)      I want to continue to lose weigh. My weight fluctuate despite being on Wegovy . I want to weight between 150-200 lbs. Monitor over the next 6 months.      Weight (lb) < 200 lb (90.7 kg)      She would like to get her weight down to 200lbs from 294 lbs within this year Would want to get her BP controlled        Fall Risk    03/11/2022    8:40 AM 09/26/2020   11:19 AM 06/18/2020   12:16 PM 03/30/2020    9:27 AM 07/19/2019    9:24 AM  Fall Risk   Falls in the past year? 0 0 0 0 0  Number falls in past yr: 0    0  Injury with Fall? 0      Follow up     Falls evaluation completed   Is the patient's home free of loose throw rugs in walkways, pet beds, electrical cords, etc?   yes      Grab bars in the bathroom? no      Handrails on the stairs?   yes      Adequate lighting?   yes  Patient rating of health (0-10) scale: 8   Depression Screen    03/13/2023   11:17 AM 02/11/2023    9:21 AM 01/26/2023    8:48 AM 10/01/2022    1:29 PM  PHQ 2/9 Scores  PHQ - 2 Score 1  0 0  PHQ- 9 Score 6  7 7   Exception Documentation  Patient refusal       Cognitive Function        Immunization History  Administered Date(s) Administered   Hepatitis B, ADULT 09/12/2019   Influenza, Seasonal, Injecte, Preservative Fre 02/11/2023   Influenza,inj,Quad PF,6+ Mos 03/22/2015, 02/25/2018, 11/29/2018, 12/19/2019, 01/18/2021, 12/03/2021   PFIZER(Purple Top)SARS-COV-2 Vaccination 12/19/2019, 01/16/2020   PNEUMOCOCCAL CONJUGATE-20 01/18/2021   Pfizer Covid-19 Vaccine Bivalent Booster 65yrs & up 01/18/2021   Pfizer(Comirnaty)Fall Seasonal Vaccine 12 years and older 03/11/2022, 02/11/2023   Pneumococcal Polysaccharide-23 10/27/2016   Tdap 07/21/2013   Zoster Recombinant(Shingrix) 03/01/2021     Screening Tests Health Maintenance  Topic Date Due   COVID-19 Vaccine (6 - 2024-25 season) 04/08/2023   Lung Cancer Screening  04/16/2023   DTaP/Tdap/Td (2 - Td or Tdap) 07/22/2023    MAMMOGRAM  09/02/2023   Medicare Annual Wellness (AWV)  03/12/2024   Cervical Cancer Screening (HPV/Pap Cotest)  03/12/2027   Colonoscopy  07/04/2029   Pneumococcal Vaccine 44-3 Years old  Completed   INFLUENZA VACCINE  Completed   Hepatitis C Screening  Completed   HIV Screening  Completed   Zoster Vaccines- Shingrix  Completed   HPV VACCINES  Aged Out    Cancer Screenings: Lung: Low Dose CT Chest recommended if Age 33-80 years, 20 pack-year currently smoking OR have quit w/in 15years. Patient does  qualify. Breast:  Up to date on Mammogram? Yes   Up to date of Bone Density/Dexa? N/A Colorectal: Up to date  Additional Screenings:  Hepatitis C Screening: Neg in 2021     Assessment/Plan:   GERD (gastroesophageal reflux disease) Worsening despite compliance with Omeprazole  ?? Worsening of her Myasthenia Gravis symptoms Most recent EGD and colonoscopy reviewed Continue Omeprazole  Avoid NSAID She is interested in f/u with GI Referral placed  Knee pain B/L knee pain Worse on the right Likely due to DJD, also, her weight might be contributing to her pain. No recent imaging Xray ordered May continue Ibuprofen  prn pain Consider PT referral at some point F/U as needed  Shortness of breath Chronic symptoms with previous extensive workup Multifactorial - Restrictive lung disease, OSA, Myasthenia gravis and OHS She has an upcoming appointment with pulmonology Need CPAP settings reassessment with them Plan to order CT lung cancer screening as she would be due for this next month F/U with Pulm as planned She agreed with the plan  Dizziness ?? Anemia vs orthostatic hypotension Information provided about fall prevention I encouraged her to use her home shower stool Anemia panel checked today I will contact her soon with her test result    Morbid Obesity: She is on Phentermine  and Metformin  She is contemplating bariatric surgery which she would discuss with her  family Continue f/u with weight loss clinic  I have personally reviewed and noted the following in the patient's chart:   Medical and social history Use of alcohol, tobacco or illicit drugs  Current medications and supplements Functional ability and status Nutritional status Physical activity Advanced directives: Booklet provided. She will complete form and return to our clinic soon. List of other physicians Hospitalizations, surgeries, and ER visits in previous 12 months Vitals Screenings to include cognitive, depression, and falls Referrals and appointments  In addition, I have reviewed and discussed with patient certain preventive protocols, quality metrics, and best practice recommendations. A written personalized care plan for preventive services as well as general preventive health recommendations were provided to patient.    Otto Fairly, MD  03/13/2023

## 2023-03-13 NOTE — Assessment & Plan Note (Signed)
 Chronic symptoms with previous extensive workup Multifactorial - Restrictive lung disease, OSA, Myasthenia gravis and OHS She has an upcoming appointment with pulmonology Need CPAP settings reassessment with them Plan to order CT lung cancer screening as she would be due for this next month F/U with Pulm as planned She agreed with the plan

## 2023-03-13 NOTE — Assessment & Plan Note (Signed)
??   Anemia vs orthostatic hypotension Information provided about fall prevention I encouraged her to use her home shower stool Anemia panel checked today I will contact her soon with her test result

## 2023-03-13 NOTE — Patient Instructions (Signed)
   [facility] Diagnosis and Treatment of Childhood Mood Disorders, ADHD, Autism, and Developmental Delay  [address], [address] [place],  [place]  [address] Get Driving Directions Main: [phone]  Assessments for ADHD and Therapy for Children  [facility]: [phone] [facility] [address], [place], [place] [address]  [phone]  [phone]  [facility]- [facility] In [facility] for Mental Health Disorders  This also provides regular therapy at low cost for children Therapists speak Spanish and English  [address], [place], [place] [address] Monday - Friday: 8:30am-12:00pm / 1:00pm-2:30pm  

## 2023-03-13 NOTE — Assessment & Plan Note (Signed)
 She is on Phentermine and Metformin She is contemplating bariatric surgery which she would discuss with her family Continue f/u with weight loss clinic

## 2023-03-13 NOTE — Assessment & Plan Note (Signed)
 B/L knee pain Worse on the right Likely due to DJD, also, her weight might be contributing to her pain. No recent imaging Xray ordered May continue Ibuprofen prn pain Consider PT referral at some point F/U as needed

## 2023-03-14 NOTE — Telephone Encounter (Signed)
 PFT order placed

## 2023-03-16 ENCOUNTER — Other Ambulatory Visit: Payer: Self-pay

## 2023-03-16 DIAGNOSIS — D509 Iron deficiency anemia, unspecified: Secondary | ICD-10-CM

## 2023-03-16 DIAGNOSIS — I10 Essential (primary) hypertension: Secondary | ICD-10-CM

## 2023-03-16 NOTE — Therapy (Signed)
 OUTPATIENT PHYSICAL THERAPY PROGRESS NOTE + RECERTIFICATION   Patient Name: Frances Medina MRN: [id number] DOB:12-14-1960, 63 y.o., female Today's Date: 03/17/2023  Progress Note Reporting Period 01/13/23 to 03/17/23  See note below for Objective Data and Assessment of Progress/Goals.      END OF SESSION:  PT End of Session - 03/17/23 0842     Visit Number 6    Number of Visits 17    Date for PT Re-Evaluation 04/21/23    Authorization Type medicare AB + [employer] state health    Authorization Time Period no auth per appt notes    Progress Note Due on Visit 16    PT Start Time 0843    PT Stop Time 0935   including 10 min cold pack at end of session   PT Time Calculation (min) 52 min    Activity Tolerance Patient tolerated treatment well                Past Medical History:  Diagnosis Date   Acute respiratory failure (HCC)    Acute respiratory failure with hypoxemia (HCC)    Aspiration into airway    Back pain 07/31/2017   Benign neoplasm of rectum    CAP (community acquired pneumonia) 08/05/2015   Depression with anxiety    Diverticulosis    Dysphagia 03/2015    EGD, Dr Avram. mild antral gastritis, ? due to Ibuprofen .  no stricture but empirically maloney dilated esophagus.    Dysphagia, neurologic    E. 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   GERD (gastroesophageal reflux disease)    hiatal hernia   Hypertension    went away when I stopped smoking   Knee injury    Left hip pain 02/18/2017   Migraine    maybe couple times/month (03/20/2015)   Mild intermittent asthma 06/22/2017   Myasthenia gravis (HCC) 2017   Myasthenia gravis with acute exacerbation (HCC) 05/15/2015    Osteoarthritis of left knee 12/02/2013   Osteoarthritis of right knee 08/30/2013   Pancreatitis 07/25/2017   Protein-calorie malnutrition, severe (HCC) 08/07/2015   Seasonal allergies    takes Zytrec   Tracheostomy status (HCC)    Tubular adenoma of colon    Tumors    in my stomach   Umbilical hernia    watching , no plans for surgery at present   Urine incontinence 10/19/2015   VAP (ventilator-associated pneumonia) [facility])    Past Surgical History:  Procedure Laterality Date   BIOPSY  07/05/2019   Procedure: BIOPSY;  Surgeon: Aneita [name], MD;  Location: [facility] ENDOSCOPY;  Service: Endoscopy;;   CESAREAN SECTION  1987; 1989   COLONOSCOPY WITH PROPOFOL  N/A 09/19/2013   Procedure: COLONOSCOPY WITH PROPOFOL ;  Surgeon: [name] Aneita, MD;  Location: [facility];  Service: Endoscopy;  Laterality: N/A;   COLONOSCOPY WITH PROPOFOL  N/A 07/05/2019   Procedure: COLONOSCOPY WITH PROPOFOL ;  Surgeon: Aneita [name], MD;  Location: [facility];  Service: Endoscopy;  Laterality: N/A;   DILATION AND CURETTAGE OF UTERUS     ESOPHAGOGASTRODUODENOSCOPY N/A 08/10/2015   Procedure: ESOPHAGOGASTRODUODENOSCOPY (EGD);  Surgeon: Lupita FORBES Avram, MD;  Location: [facility] ENDOSCOPY;  Service: Endoscopy;  Laterality: N/A;   ESOPHAGOGASTRODUODENOSCOPY (EGD)  WITH PROPOFOL  N/A 03/21/2015   Procedure: ESOPHAGOGASTRODUODENOSCOPY (EGD) WITH PROPOFOL ;  Surgeon: Lupita FORBES Commander, MD;  Location: [facility] ENDOSCOPY;  Service: Endoscopy;  Laterality: N/A;   ESOPHAGOGASTRODUODENOSCOPY (EGD) WITH PROPOFOL  N/A 07/05/2019   Procedure: ESOPHAGOGASTRODUODENOSCOPY (EGD) WITH PROPOFOL ;  Surgeon: Aneita [name], MD;  Location: [facility] ENDOS[facility];  Service: Endoscopy;  Laterality: N/A;   PARTIAL KNEE ARTHROPLASTY Right 08/30/2013   Procedure: RIGHT UNICOMPARTMENTAL KNEE;  Surgeon: Fonda SHAUNNA Olmsted, MD;  Location: [facility] OR;  Service: Orthopedics;  Laterality: Right;   PARTIAL KNEE ARTHROPLASTY Left 12/02/2013   Procedure: LEFT KNEE UNI ARTHROPLASTY;  Surgeon: Fonda SHAUNNA Olmsted, MD;  Location: [facility];  Service: Orthopedics;  Laterality: Left;   PEG PLACEMENT N/A 08/10/2015   Procedure: PERCUTANEOUS ENDOSCOPIC GASTROSTOMY (PEG) PLACEMENT;  Surgeon: Lupita FORBES Commander, MD;  Location[facility] ENDOSCOPY;  Service: Endoscopy;  Laterality: N/A;   POLYPECTOMY  07/05/2019   Procedure: POLYPECTOMY;  Surgeon: Aneita [name], MD;  Location: [facility] ENDOSCOPY;  Service: Endoscopy;;   TUBAL LIGATION  1989   VAGINAL HYSTERECTOMY  1990's?   apparently took out one of my ovaries at the time too cause one's missing   Patient Active Problem List   Diagnosis Date Noted   Knee pain 03/13/2023   Dizziness 03/13/2023   BMI 50.0-59.9, adult (HCC) 01/26/2023   Lumbar radiculopathy 12/03/2021   Positive ANA (antinuclear antibody) 08/26/2019   History of colonic polyps    Macromastia 06/17/2019   Seasonal allergies 02/18/2019   GAD (generalized anxiety disorder) 08/06/2018   Sinusitis 02/25/2018   Iron  deficiency anemia 07/31/2017   Pre-diabetes 07/31/2017   Osteoarthritis of left hip 02/18/2017   OSA on CPAP 09/11/2016   Restrictive lung disease 10/11/2015   Major depression    Headache, migraine    Shortness of breath    Splenomegaly 04/07/2015   Myasthenia gravis (HCC)    Osteoarthritis of left knee 12/02/2013   Benign neoplasm of descending colon 09/19/2013   Osteoarthritis of right knee 08/30/2013   GERD (gastroesophageal reflux disease) 06/23/2013   Fibroids 06/23/2013    PCP: Anders Otto DASEN, MD  REFERRING PROVIDER: Joya Stabs, DPM  REFERRING DIAG: [phone] (ICD-10-CM) - Tendonitis, Achilles, left  THERAPY DIAG:  Pain in left ankle and joints of left foot  Other abnormalities of gait and mobility  Rationale for Evaluation and Treatment: Rehabilitation  ONSET DATE: a couple months per pt report  SUBJECTIVE:   SUBJECTIVE STATEMENT: 03/17/2023 Pt states in time away from therapy she feels she has worsened, is now having R knee pain which  she attributes to walking differently with more pain. Has been doing HEP, feels therapy sessions were helping prior to gap in care. Would like to continue with PT.    EVAL- Cannot recall any precipitating factors, pt states it seems like it is slowly worsening. 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  Does report some L LE numbness and back pain when standing, reports this has been going on since knee surgeries. Tries to walk 3x/week with her sister for past couple weeks. Also reports swelling in her limb since her knee surgery, does not endorse any recent changes with this.   PERTINENT HISTORY: depression/anxiety, dysphagia, Cdiff, HTN, migraines, myasthenia gravis (in remission per pt), OSA  PAIN:  Are you having pain: 8/10 in heel, especially with stretching  Per eval - Location/description: L ankle, lateral  heel; burning,cramping at times - aggravating factors: stair navigation, walking ~30 min at a time, driving, footwear - Easing factors: propping up foot, min relief from stretching, footwear  PRECAUTIONS: None  WEIGHT BEARING RESTRICTIONS: No  FALLS:  Has patient fallen in last 6 months? No - reports near falls due to pain   LIVING ENVIRONMENT: Lives w/ husband and two children 2 story house; 14 steps to second floor with rail, 4STE Pt does majority of housework Tub shower, no rails, no seat Has a walker, doesn't use it  OCCUPATION: not working, on disability since 2016-2017 per pt report  PLOF: Independent - with cane use since knee surgeries  PATIENT GOALS: wants to heal her ankle   NEXT MD VISIT: TBD  OBJECTIVE:  Note: Objective measures were completed at Evaluation unless otherwise noted.  DIAGNOSTIC FINDINGS:  12/30/22 L foot XR, refer to podiatry notes for details; noted spurring to posterior calcaneus per physician read  PATIENT SURVEYS:  FOTO 63  current, 66 predicted FOTO 03/17/23: 62   COGNITION: Overall cognitive status: Within functional limits for tasks assessed     SENSATION: Light touch intact BIL LE, increased sensitivity to light touch posterior heel and lateral heel L  EDEMA:  Gross swelling noted about L ankle around joint line, no apparent erythema or bruising   PALPATION: Concordant tenderness posterior heel, lateral heel on L. No other muscular or bony tenderness foot or calf   LOWER EXTREMITY ROM:     Active ROM Right eval Left eval Left 03/17/23   Knee flexion     Knee extension     Ankle dorsiflexion 12 deg 12 deg * 15 deg  Ankle plantarflexion 40 deg 32 deg *   Ankle inversion 29 deg 15 deg * 18 deg   Ankle eversion 15 deg 14 deg * 14 deg    (Blank rows = not tested) Comments:  no pain with PROM (Blank rows = not tested) (Key: WFL = within functional limits not formally assessed, * = concordant pain, s = stiffness/stretching sensation, NT = not tested)  Comments:    LOWER EXTREMITY MMT:  MMT Right eval Left eval Left 03/17/23    Knee flexion     Knee extension     Ankle dorsiflexion 5 4+ 4+  Ankle plantarflexion     Ankle inversion 5 4+ 5  Ankle eversion 5 4+ 5   (Blank rows = not tested) Comments:   (Blank rows = not tested) (Key: WFL = within functional limits not formally assessed, * = concordant pain, s = stiffness/stretching sensation, NT = not tested)  Comments:    LOWER EXTREMITY SPECIAL TESTS:  deferred  FUNCTIONAL TESTS:  5xSTS: 11.96sec without UE support, does increase heel pain  03/17/23: - 5xSTS 10.90 sec R knee pain, gentle UE support   GAIT: Distance walked: within clinic Assistive device utilized: Single point cane and None Level of assistance: Modified independence Comments: antalgic gait LLE, reduced gait speed/cadence   TODAY'S TREATMENT:       [facility] Adult PT Treatment:                                                DATE: 03/17/23 Therapeutic Exercise: 5-5-5  tempo heel raise, standing 3x5 cues for pacing and form Seated towel scrunches 2x12 cues for form and tripod  Seated toe extensions 2x12 cues  for reduced DF compensations and tripod  Therapeutic Activity: 5xSTS + eduation FOTO + education Education/discussion re: progress with PT, symptom behavior as it affects activity tolerance, PT goals/POC   Modalities: Cold pack L heel/ankle, prone, 10 min with good relief, no adverse events    [facility] Adult PT Treatment:                                                DATE: 02/04/23 Therapeutic Exercise: Seated green band PF 2x15 seated Seated green band eversion 2x10 Seated green band inversion 2x10 Seated green band DF 2x10 Standing heel raise isos, 3 bouts: 25 sec, 50sec, 54sec  Standing soleus slantboard stretch x8 gently rocking Standing gastroc slantboard stretch x8 gently rocking  Manual Therapy: Prone; STM to gastroc/soleus, plantar aspect of foot; trigger point release gastroc soleus, passive PF with gentle pin and stretch (palm technique) gastroc soleus  Modalities: Cold pack, prone; L ankle/heel with good relief, no adverse events 10 min    [facility] Adult PT Treatment:                                                DATE: 02/02/23 Therapeutic Exercise: Seated calf stretch Baps L3  Seated heel raise 5# on knee 10 x 3  Seated red band EV 10 x 2  Seated red band Inv 10 x 2  Seated blue band PF 10 x 2  Standing isometric calf raise hold 1 minute x 2 (45 sec rest break) Standing Gastroc stretch x 2 for 10 sec  SManual Therapy: TPR to gastroc followed by STW  Modalities: Ice pack to heel in prone x 10 min    [facility] Adult PT Treatment:                                                DATE: 01/27/23 Therapeutic Exercise: Seated calf stretch with towel x 2 for 30 sec each  Manual Therapy: TPR x 6 to calf and lateral lower leg.  STW to calf supine and prone Passive gastroc stretch Cross friction to achilles   Modalities: Ice pack  to heel/ankle long sitting x 7 min    [facility] Adult PT Treatment:                                                DATE: 01-26-23 Therapeutic Exercise: Nustep 6 min with UE/LE Slant board stretch 15 sec x 3 on Right painful for pt. Seated heel raise 2 x 8 Isometric hold 45 sec standing heel raise at various angles x 5 with UE on wall  1 minute rest in between Seated hip abduction with RTB 2 x 10 Manual Therapy: STW to L ankle for edema/swelling control  [facility] Adult PT Treatment:                                                DATE: 01/13/23 Therapeutic Exercise: Ankle inversion/eversion towel slides x8 Seated heel raises x8 HEP handout + education    PATIENT EDUCATION:  Education details: rationale for interventions, HEP, progress note activities Person educated: Patient Education method: Explanation, Demonstration, Tactile cues, Verbal cues Education comprehension: verbalized understanding, returned demonstration, verbal cues required, tactile cues required, and needs further education     HOME EXERCISE PROGRAM: Access Code: [email or web address] URL: [email or web address] Date: 03/17/2023 Prepared by: Alm Jenny  Exercises - Isometric Heel Raise at Wall  - 2 x daily - 7 x weekly - 1 sets - 5 reps - 45 sec rest 1 min hold - Seated Hip Abduction with Resistance  - 1 x daily - 7 x weekly - 3 sets - 10 reps - Seated Calf Stretch with Strap  - 1 x daily - 7 x weekly - 1 sets - 3 reps - 30 hold - Towel Scrunches  - 1 x daily - 7 x weekly - 2 sets - 12 reps - Seated Great Toe Extension  - 1 x daily - 7 x weekly - 2 sets - 12 reps  ASSESSMENT:  CLINICAL IMPRESSION: 03/17/2023 Pt arrives with 8/10 pain, endorses increased heel/foot pain and R knee pain during gap in care. Despite this she has made fair progress towards therapy goals (see below); in discussion with pt,  recommend extension of dates in POC to accommodate remaining visits in initial plan. She has made good progress in regards to ROM and MMT, fair progress with 5xSTS despite gap in care - continues with pain, ROM/strength deficits that would likely benefit from additional PT. Pt tolerates intervention well today with progression towards tempo heel raises, initially has mild irritation but improves with repetition. No adverse events, cold pack at end of session per pt request with report of good relief. Departs with report of improved pain compared to arrival, unrated on NPS. Pt departs today's session in no acute distress, all voiced questions/concerns addressed appropriately from PT perspective.    EVAL- Patient is a pleasant 63 y.o. woman who was seen today for physical therapy evaluation and treatment for L achilles tendon issues. She endorses difficulty with WB tasks, particularly stair navigation. On exam she demonstrates painful limitations in L ankle mobility and strength, hypersensitivity to light touch at posterior/lateral heel. Symptoms are irritable throughout although she denies any increase in resting pain. Does well with HEP, no increase in symptoms. No adverse events. Recommend trial of skilled PT to address aforementioned deficits with aim of improving functional tolerance and reducing pain with typical activities. Pt departs today's session in no acute distress, all voiced concerns/questions addressed appropriately from PT perspective.      OBJECTIVE IMPAIRMENTS: Abnormal gait, decreased activity tolerance, decreased endurance, decreased mobility, difficulty walking, decreased ROM, decreased strength, improper body mechanics, and pain.   ACTIVITY LIMITATIONS: standing, squatting, stairs, transfers, and locomotion level  PARTICIPATION LIMITATIONS: meal prep, cleaning, laundry, driving, and community activity  PERSONAL FACTORS: Age, Time since onset of injury/illness/exacerbation, and 3+  comorbidities: depression/anxiety, dysphagia, Cdiff, HTN, migraines, myasthenia gravis, tracheostomy, OSA  are also affecting patient's functional outcome.   REHAB POTENTIAL: Fair given comorbidities  CLINICAL DECISION MAKING: Evolving/moderate complexity  EVALUATION COMPLEXITY: Moderate  GOALS: Goals reviewed with patient? Yes  SHORT TERM GOALS: Target date: 02/10/2023 Pt will demonstrate appropriate understanding and performance of initially prescribed HEP in order to facilitate improved independence with management of symptoms.  Baseline: HEP provided on eval 03/17/23: reports good HEP adherence Goal status: MET  2. Pt will report ability to ambulate at least 30 min with less than 3 pt increase in pain on NPS in order to facilitate improved community navigation.  Baseline: pain with 30 min per pt report 03/17/23: pt reports she has been walking in grocery stores for over an hour for exercise, does have increased pain in heel Goal status: ONGOING  LONG TERM GOALS: Target date: 04/21/2023 (updated 03/17/23) Pt will score 66 or greater on FOTO in order to demonstrate improved perception of function due to symptoms.  Baseline: 63 03/17/23: 62 Goal status: ONGOING  2.  Pt will demonstrate grossly symmetrical ankle DF/inversion AROM in order to facilitate improved tolerance to functional movements. Baseline: see ROM chart above 03/17/23: see ROM chart above Goal status: ONGOING  3.  Pt will demonstrate appropriate performance of final prescribed HEP in order to facilitate improved self-management of symptoms post-discharge.   Baseline: initial HEP prescribed 03/17/23: HEP going well per pt Goal status: ONGOING  4.  Pt will be able to perform 5xSTS in less than or equal to 9sec in order to demonstrate reduced fall risk and improved functional independence (MCID 5xSTS = 2.3 sec). Baseline: 11.96sec with increase in pain 03/17/23: 10sec Goal status: ONGOING   PLAN: UPDATED 03/17/23    PT FREQUENCY: 2x/week   PT DURATION: 5 weeks  PLANNED INTERVENTIONS: 97164- PT Re-evaluation, 97110-Therapeutic exercises, 97530- Therapeutic activity, 97112- Neuromuscular re-education, 97535- Self Care, [id number]- Manual therapy, [phone]- Gait training, Patient/Family education, Balance training, Stair training, Taping, Dry Needling, Joint mobilization, Cryotherapy, and Moist heat  PLAN FOR NEXT SESSION: continue along updated POC with emphasis on foot/ankle strength, improving mechanics with functional mobility. Monitor pt report of R knee pain.    Alm DELENA Jenny PT, DPT 03/17/2023 11:49 AM

## 2023-03-16 NOTE — Progress Notes (Signed)
 Patient is scheduled for her CT at 930 on Jan 17th

## 2023-03-17 ENCOUNTER — Ambulatory Visit: Payer: 59 | Attending: Podiatry | Admitting: Physical Therapy

## 2023-03-17 ENCOUNTER — Encounter: Payer: Self-pay | Admitting: Physical Therapy

## 2023-03-17 ENCOUNTER — Telehealth: Payer: Self-pay | Admitting: Family Medicine

## 2023-03-17 DIAGNOSIS — R2689 Other abnormalities of gait and mobility: Secondary | ICD-10-CM | POA: Diagnosis present

## 2023-03-17 DIAGNOSIS — M25572 Pain in left ankle and joints of left foot: Secondary | ICD-10-CM | POA: Diagnosis present

## 2023-03-17 LAB — ANEMIA PROFILE B
Basophils Absolute: 0 10*3/uL (ref 0.0–0.2)
Basos: 0 %
EOS (ABSOLUTE): 0 10*3/uL (ref 0.0–0.4)
Eos: 0 %
Ferritin: 69 ng/mL (ref 15–150)
Folate: 2.4 ng/mL — ABNORMAL LOW (ref >3.0)
Hematocrit: 38.8 % (ref 34.0–46.6)
Hemoglobin: 12 g/dL (ref 11.1–15.9)
Immature Grans (Abs): 0 10*3/uL (ref 0.0–0.1)
Immature Granulocytes: 0 %
Iron Saturation: 12 % — ABNORMAL LOW (ref 15–55)
Iron: 40 ug/dL (ref 27–139)
Lymphocytes Absolute: 1.7 10*3/uL (ref 0.7–3.1)
Lymphs: 22 %
MCH: 22.1 pg — ABNORMAL LOW (ref 26.6–33.0)
MCHC: 30.9 g/dL — ABNORMAL LOW (ref 31.5–35.7)
MCV: 72 fL — ABNORMAL LOW (ref 79–97)
Monocytes Absolute: 0.4 10*3/uL (ref 0.1–0.9)
Monocytes: 5 %
Neutrophils Absolute: 5.6 10*3/uL (ref 1.4–7.0)
Neutrophils: 73 %
Platelets: 129 10*3/uL — ABNORMAL LOW (ref 150–450)
RBC: 5.43 x10E6/uL — ABNORMAL HIGH (ref 3.77–5.28)
RDW: 18.5 % — ABNORMAL HIGH (ref 11.7–15.4)
Retic Ct Pct: 1.3 % (ref 0.6–2.6)
Total Iron Binding Capacity: 328 ug/dL (ref 250–450)
UIBC: 288 ug/dL (ref 118–369)
Vitamin B-12: 629 pg/mL (ref 232–1245)
WBC: 7.7 10*3/uL (ref 3.4–10.8)

## 2023-03-17 LAB — BASIC METABOLIC PANEL
BUN/Creatinine Ratio: 18 (ref 12–28)
BUN: 13 mg/dL (ref 8–27)
CO2: 24 mmol/L (ref 20–29)
Calcium: 9.2 mg/dL (ref 8.7–10.3)
Chloride: 102 mmol/L (ref 96–106)
Creatinine, Ser: 0.72 mg/dL (ref 0.57–1.00)
Glucose: 82 mg/dL (ref 70–99)
Potassium: 4 mmol/L (ref 3.5–5.2)
Sodium: 142 mmol/L (ref 134–144)
eGFR: 95 mL/min/{1.73_m2} (ref >59)

## 2023-03-17 LAB — LIPID PANEL
Chol/HDL Ratio: 4.1 {ratio} (ref 0.0–4.4)
Cholesterol, Total: 187 mg/dL (ref 100–199)
HDL: 46 mg/dL (ref >39)
LDL Chol Calc (NIH): 121 mg/dL — ABNORMAL HIGH (ref 0–99)
Triglycerides: 110 mg/dL (ref 0–149)
VLDL Cholesterol Cal: 20 mg/dL (ref 5–40)

## 2023-03-17 NOTE — Progress Notes (Signed)
 I received and completed another CPAP order form today 03/17/23

## 2023-03-17 NOTE — Telephone Encounter (Signed)
 Discussed test result. Cholesterol discussed. Moderate intensity Statins recommended. However, Statins can remotely aggravate Myasthenia. Options given to trial Statin. However, she prefers lifestyle modification. Repeat lab in 1 yr. Hemoglobin looks good. Iron  Sat low. May resume OTC iron  supplement. No further questions.

## 2023-03-19 ENCOUNTER — Other Ambulatory Visit: Payer: Self-pay | Admitting: Student

## 2023-03-20 ENCOUNTER — Ambulatory Visit (HOSPITAL_COMMUNITY)
Admission: RE | Admit: 2023-03-20 | Discharge: 2023-03-20 | Disposition: A | Discharge status: Home / Self Care | Payer: 59 | Source: Ambulatory Visit | Attending: Family Medicine | Admitting: Family Medicine

## 2023-03-20 DIAGNOSIS — Z122 Encounter for screening for malignant neoplasm of respiratory organs: Secondary | ICD-10-CM | POA: Insufficient documentation

## 2023-03-20 DIAGNOSIS — Z87891 Personal history of nicotine dependence: Secondary | ICD-10-CM | POA: Insufficient documentation

## 2023-03-23 NOTE — Therapy (Signed)
OUTPATIENT PHYSICAL THERAPY TREATMENT NOTE   Patient Name: NAKEIA GENTILE MRN: [id number] DOB:[birth year], 63 y.o., female Today's Date: 03/24/2023     END OF SESSION:  PT End of Session - 03/24/23 0945     Visit Number 7    Number of Visits 17    Date for PT Re-Evaluation 04/21/23    Authorization Type medicare AB + [employer] state health    Progress Note Due on Visit 16    PT Start Time 0930    PT Stop Time 1015    PT Time Calculation (min) 45 min    Activity Tolerance Patient tolerated treatment well    Behavior During Therapy [facility] for tasks assessed/performed                 Past Medical History:  Diagnosis Date   Acute respiratory failure (HCC)    Acute respiratory failure with hypoxemia (HCC)    Aspiration into airway    Back pain 07/31/2017   Benign neoplasm of rectum    CAP (community acquired pneumonia) 08/05/2015   Depression with anxiety    Diverticulosis    Dysphagia 03/2015    EGD, Dr Leone Payor. mild antral gastritis, ? due to Ibuprofen.  no stricture but empirically maloney dilated esophagus.    Dysphagia, neurologic    E. 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   GERD (gastroesophageal reflux disease)    hiatal hernia   Hypertension    "went away when I stopped smoking"   Knee injury    Left hip pain 02/18/2017   Migraine    "maybe couple times/month" (03/20/2015)   Mild intermittent asthma 06/22/2017   Myasthenia gravis (HCC) 2017   Myasthenia gravis with acute exacerbation (HCC) 05/15/2015   Osteoarthritis of left knee 12/02/2013   Osteoarthritis of right knee 08/30/2013   Pancreatitis 07/25/2017   Protein-calorie malnutrition, severe (HCC) 08/07/2015   Seasonal allergies     takes Zytrec   Tracheostomy status (HCC)    Tubular adenoma of colon    Tumors    "in my stomach"   Umbilical hernia    watching , no plans for surgery at present   Urine incontinence 10/19/2015   VAP (ventilator-associated pneumonia) [facility])    Past Surgical History:  Procedure Laterality Date   BIOPSY  07/05/2019   Procedure: BIOPSY;  Surgeon: Meryl Dare, MD;  Location: [facility] ENDOSCOPY;  Service: Endoscopy;;   CESAREAN SECTION  1987; 1989   COLONOSCOPY WITH PROPOFOL N/A 09/19/2013   Procedure: COLONOSCOPY WITH PROPOFOL;  Surgeon: Meryl Dare, MD;  Location: [facility] ENDOSCOPY;  Service: Endoscopy;  Laterality: N/A;   COLONOSCOPY WITH PROPOFOL N/A 07/05/2019   Procedure: COLONOSCOPY WITH PROPOFOL;  Surgeon: Meryl Dare, MD;  Location: [facility] ENDOSCOPY;  Service: Endoscopy;  Laterality: N/A;   DILATION AND CURETTAGE OF UTERUS     ESOPHAGOGASTRODUODENOSCOPY N/A 08/10/2015   Procedure: ESOPHAGOGASTRODUODENOSCOPY (EGD);  Surgeon: Iva Boop, MD;  Location: [facility] ENDOSCOPY;  Service: Endoscopy;  Laterality: N/A;   ESOPHAGOGASTRODUODENOSCOPY (EGD) WITH PROPOFOL N/A 03/21/2015   Procedure: ESOPHAGOGASTRODUODENOSCOPY (EGD) WITH PROPOFOL;  Surgeon: Iva Boop, MD;  Location: [facility] ENDOSCOPY;  Service: Endoscopy;  Laterality: N/A;   ESOPHAGOGASTRODUODENOSCOPY (EGD) WITH  PROPOFOL N/A 07/05/2019   Procedure: ESOPHAGOGASTRODUODENOSCOPY (EGD) WITH PROPOFOL;  Surgeon: Meryl Dare, MD;  Location: [facility] ENDOSCOPY;  Service: Endoscopy;  Laterality: N/A;   PARTIAL KNEE ARTHROPLASTY Right 08/30/2013   Procedure: RIGHT UNICOMPARTMENTAL KNEE;  Surgeon: Eulas Post, MD;  Location: [facility] OR;  Service: Orthopedics;  Laterality: Right;   PARTIAL KNEE ARTHROPLASTY Left 12/02/2013   Procedure: LEFT KNEE UNI ARTHROPLASTY;  Surgeon: Eulas Post, MD;  Location: [facility];  Service: Orthopedics;  Laterality: Left;   PEG PLACEMENT N/A 08/10/2015   Procedure: PERCUTANEOUS ENDOSCOPIC GASTROSTOMY (PEG)  PLACEMENT;  Surgeon: Iva Boop, MD;  Location: [facility] ENDOSCOPY;  Service: Endoscopy;  Laterality: N/A;   POLYPECTOMY  07/05/2019   Procedure: POLYPECTOMY;  Surgeon: Meryl Dare, MD;  Location: [facility] ENDOSCOPY;  Service: Endoscopy;;   TUBAL LIGATION  1989   VAGINAL HYSTERECTOMY  1990's?   "apparently took out one of my ovaries at the time too cause one's missing"   Patient Active Problem List   Diagnosis Date Noted   Knee pain 03/13/2023   Dizziness 03/13/2023   BMI 50.0-59.9, adult (HCC) 01/26/2023   Lumbar radiculopathy 12/03/2021   Positive ANA (antinuclear antibody) 08/26/2019   History of colonic polyps    Macromastia 06/17/2019   Seasonal allergies 02/18/2019   GAD (generalized anxiety disorder) 08/06/2018   Sinusitis 02/25/2018   Iron deficiency anemia 07/31/2017   Pre-diabetes 07/31/2017   Osteoarthritis of left hip 02/18/2017   OSA on CPAP 09/11/2016   Restrictive lung disease 10/11/2015   Major depression    Headache, migraine    Shortness of breath    Splenomegaly 04/07/2015   Myasthenia gravis (HCC)    Osteoarthritis of left knee 12/02/2013   Benign neoplasm of descending colon 09/19/2013   Osteoarthritis of right knee 08/30/2013   GERD (gastroesophageal reflux disease) 06/23/2013   Fibroids 06/23/2013    PCP: Doreene Eland, MD  REFERRING PROVIDER: Louann Sjogren, DPM  REFERRING DIAG: [phone] (ICD-10-CM) - Tendonitis, Achilles, left  THERAPY DIAG:  Pain in left ankle and joints of left foot  Other abnormalities of gait and mobility  Rationale for Evaluation and Treatment: Rehabilitation  ONSET DATE: a couple months per pt report   SUBJECTIVE:  SUBJECTIVE STATEMENT: Patient reports she got some new shoes with air things in them. She states her heels will just start to ache and she has gotten some cramps in her foot. She also notes the left achilles is causing her to put more weight on the right leg that is causing the right leg to hurt.  EVAL-  Cannot recall any precipitating factors, pt states it seems like it is slowly worsening. 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  Does report some L LE numbness and back pain when standing, reports this has been going on since knee surgeries. Tries to walk 3x/week with her sister for past couple weeks. Also reports swelling in her limb since her knee surgery, does not endorse any recent changes with this.   PERTINENT HISTORY: depression/anxiety, dysphagia, Cdiff, HTN, migraines, myasthenia gravis (in remission per pt), OSA   PAIN:  Are you having pain: 8/10 in heel, especially with stretching Location/description: L achilles, lateral heel; burning,cramping at times - aggravating factors: stair navigation, walking ~30 min at a time, driving, footwear - Easing factors: propping up foot, min relief from stretching, footwear  PRECAUTIONS:  None  WEIGHT BEARING RESTRICTIONS: No  FALLS:  Has patient fallen in last 6 months? No - reports near falls due to pain   LIVING ENVIRONMENT: Lives w/ husband and two children 2 story house; 14 steps to second floor with rail, 4STE Pt does majority of housework Tub shower, no rails, no seat Has a walker, doesn't use it  OCCUPATION: not working, on disability since 2016-2017 per pt report  PLOF: Independent - with cane use since knee surgeries  PATIENT GOALS: wants to heal her ankle   OBJECTIVE:  Note: Objective measures were completed at Evaluation unless otherwise noted. PATIENT SURVEYS:  FOTO 63 current, 66 predicted FOTO 03/17/23: 62   COGNITION: Overall cognitive status: Within functional limits for tasks assessed     SENSATION: Light touch intact BIL LE, increased sensitivity to light touch posterior heel and lateral heel L  EDEMA:  Gross swelling noted about L ankle around joint line, no apparent erythema or  bruising   PALPATION: Concordant tenderness posterior heel, lateral heel on L. No other muscular or bony tenderness foot or calf   LOWER EXTREMITY ROM:     Active ROM Right eval Left eval Left 03/17/23   Knee flexion     Knee extension     Ankle dorsiflexion 12 deg 12 deg * 15 deg  Ankle plantarflexion 40 deg 32 deg *   Ankle inversion 29 deg 15 deg * 18 deg   Ankle eversion 15 deg 14 deg * 14 deg    (Blank rows = not tested) Comments:  no pain with PROM (Blank rows = not tested) (Key: WFL = within functional limits not formally assessed, * = concordant pain, s = stiffness/stretching sensation, NT = not tested)  Comments:    LOWER EXTREMITY MMT:  MMT Right eval Left eval Left 03/17/23    Knee flexion     Knee extension     Ankle dorsiflexion 5 4+ 4+  Ankle plantarflexion     Ankle inversion 5 4+ 5  Ankle eversion 5 4+ 5   (Blank rows = not tested) Comments:   (Blank rows = not tested) (Key: WFL = within functional limits not formally assessed, * = concordant pain, s = stiffness/stretching sensation, NT = not tested)  Comments:    LOWER EXTREMITY SPECIAL TESTS:  deferred  FUNCTIONAL TESTS:  5xSTS: 11.96sec without UE support, does increase heel pain  03/17/23: - 5xSTS 10.90 sec R knee pain, gentle UE support   GAIT: Distance walked: within clinic Assistive device utilized: Single point cane and None Level of assistance: Modified independence Comments: antalgic gait LLE, reduced gait speed/cadence   TODAY'S TREATMENT:       [facility] Adult PT Treatment:                                                DATE: 03/24/23 Therapeutic Exercise: Standing calf stretch at counter 3 x 30 sec Seated heel raises with 15# 3 x 10 Longsitting ankle PF with blue 2 x 20 Longsitting ankle inversion/eversion with red 2 x 20 each Standing heel raises 3 x 10 Modalities: Cold pack L heel/ankle in longsitting x 5 min   [facility] Adult PT Treatment:  DATE: 03/17/23 Therapeutic Exercise: 5-5-5 tempo heel raise, standing 3x5 cues for pacing and form Seated towel scrunches 2x12 cues for form and tripod  Seated toe extensions 2x12 cues for reduced DF compensations and tripod Therapeutic Activity: 5xSTS + eduation FOTO + education Education/discussion re: progress with PT, symptom behavior as it affects activity tolerance, PT goals/POC  Modalities: Cold pack L heel/ankle, prone, 10 min with good relief, no adverse events  [facility] Adult PT Treatment:                                                DATE: 02/04/23 Therapeutic Exercise: Seated green band PF 2x15 seated Seated green band eversion 2x10 Seated green band inversion 2x10 Seated green band DF 2x10 Standing heel raise isos, 3 bouts: 25 sec, 50sec, 54sec  Standing soleus slantboard stretch x8 gently rocking Standing gastroc slantboard stretch x8 gently rocking  Manual Therapy: Prone; STM to gastroc/soleus, plantar aspect of foot; trigger point release gastroc soleus, passive PF with gentle pin and stretch (palm technique) gastroc soleus Modalities: Cold pack, prone; L ankle/heel with good relief, no adverse events 10 min  [facility] Adult PT Treatment:                                                DATE: 02/02/23 Therapeutic Exercise: Seated calf stretch Baps L3  Seated heel raise 5# on knee 10 x 3  Seated red band EV 10 x 2  Seated red band Inv 10 x 2  Seated blue band PF 10 x 2  Standing isometric calf raise hold 1 minute x 2 (45 sec rest break) Standing Gastroc stretch x 2 for 10 sec  SManual Therapy: TPR to gastroc followed by STW Modalities: Ice pack to heel in prone x 10 min  [facility] Adult PT Treatment:                                                DATE: 01/27/23 Therapeutic Exercise: Seated calf stretch with towel x 2 for 30 sec each  Manual Therapy: TPR x 6 to calf and lateral lower leg.  STW to calf supine and prone Passive gastroc stretch Cross friction to achilles   Modalities: Ice pack to heel/ankle long sitting x 7 min  [facility] Adult PT Treatment:                                                DATE: 01-26-23 Therapeutic Exercise: Nustep 6 min with UE/LE Slant board stretch 15 sec x 3 on Right painful for pt. Seated heel raise 2 x 8 Isometric hold 45 sec standing heel raise at various angles x 5 with UE on wall  1 minute rest in between Seated hip abduction with RTB 2 x 10 Manual Therapy: STW to L ankle for edema/swelling control  [facility] Adult PT Treatment:                                                DATE: 01/13/23 Therapeutic Exercise: Ankle inversion/eversion towel slides x8 Seated heel raises x8 HEP handout + education  PATIENT EDUCATION:  Education details: HEP update Person educated: Patient Education method: Explanation, Demonstration, Tactile cues, Verbal cues, Handout Education comprehension: verbalized understanding, returned demonstration, verbal cues required, tactile cues required, and needs further education     HOME EXERCISE PROGRAM: Access Code: [email or web address]   ASSESSMENT: CLINICAL IMPRESSION: Patient tolerated therapy well with no adverse effects. She reports continued left achilles pain. Therapy focused primarily on progressing left ankle and calf tightness for improved achilles activity tolerance. She was able to progress with banded ankle resistance and was able to complete all standing strengthening. She does report a burning of the achilles with exercise. Updated HEP to progress ankle strengthening for home. Patient would benefit from continued skilled PT to progress her ankle strength and activity tolerance to reduce pain and maximize functional ability.   EVAL- Patient is a pleasant 63 y.o. woman who was seen today for physical therapy evaluation and treatment for L achilles tendon issues. She endorses  difficulty with WB tasks, particularly stair navigation. On exam she demonstrates painful limitations in L ankle mobility and strength, hypersensitivity to light touch at posterior/lateral heel. Symptoms are irritable throughout although she denies any increase in resting pain. Does well with HEP, no increase in symptoms. No adverse events. Recommend trial of skilled PT to address aforementioned deficits with aim of improving functional tolerance and reducing pain with typical activities. Pt departs today's session in no acute distress, all voiced concerns/questions addressed appropriately from PT perspective.      OBJECTIVE IMPAIRMENTS: Abnormal gait, decreased activity tolerance, decreased endurance, decreased mobility, difficulty walking, decreased ROM, decreased strength, improper body mechanics, and pain.   ACTIVITY LIMITATIONS: standing, squatting, stairs, transfers, and locomotion level  PARTICIPATION LIMITATIONS: meal prep, cleaning, laundry, driving, and community activity  PERSONAL FACTORS: Age, Time since onset of injury/illness/exacerbation, and 3+ comorbidities: depression/anxiety, dysphagia, Cdiff, HTN, migraines, myasthenia gravis, tracheostomy, OSA  are also affecting patient's functional outcome.    GOALS: Goals reviewed with patient? Yes  SHORT TERM GOALS: Target date: 02/10/2023  Pt will demonstrate appropriate understanding and performance of initially prescribed HEP in order to facilitate improved independence with management of symptoms.  Baseline: HEP provided on eval 03/17/23: reports good HEP adherence Goal status: MET  2. Pt will report ability to ambulate at least 30 min with less than 3 pt increase in pain on NPS in order to facilitate improved community navigation.  Baseline: pain with 30 min per pt report 03/17/23: pt reports she has been walking in grocery stores for over an hour for exercise, does have increased pain in heel Goal status: ONGOING  LONG TERM  GOALS: Target date: 04/21/2023 (updated 03/17/23)  Pt will score 66 or greater on FOTO in order to demonstrate improved perception of function due to symptoms.  Baseline: 63 03/17/23: 62 Goal status: ONGOING  2.  Pt will demonstrate grossly symmetrical ankle DF/inversion AROM in order to facilitate improved tolerance to functional movements. Baseline: see ROM chart above 03/17/23: see ROM chart above Goal status: ONGOING  3.  Pt will demonstrate appropriate performance of final prescribed HEP in order to facilitate improved self-management  of symptoms post-discharge.   Baseline: initial HEP prescribed 03/17/23: HEP going well per pt Goal status: ONGOING  4.  Pt will be able to perform 5xSTS in less than or equal to 9sec in order to demonstrate reduced fall risk and improved functional independence (MCID 5xSTS = 2.3 sec). Baseline: 11.96sec with increase in pain 03/17/23: 10sec Goal status: ONGOING   PLAN:  PT FREQUENCY: 2x/week   PT DURATION: 5 weeks  PLANNED INTERVENTIONS: 97164- PT Re-evaluation, 97110-Therapeutic exercises, 97530- Therapeutic activity, 97112- Neuromuscular re-education, 97535- Self Care, 59563- Manual therapy, [phone]- Gait training, Patient/Family education, Balance training, Stair training, Taping, Dry Needling, Joint mobilization, Cryotherapy, and Moist heat  PLAN FOR NEXT SESSION: continue along updated POC with emphasis on foot/ankle strength, improving mechanics with functional mobility. Monitor pt report of R knee pain.    Rosana Hoes, PT, DPT, LAT, ATC 03/24/23  10:16 AM Phone: [phone] Fax: [phone]

## 2023-03-24 ENCOUNTER — Encounter: Payer: Self-pay | Admitting: Physical Therapy

## 2023-03-24 ENCOUNTER — Other Ambulatory Visit: Payer: Self-pay

## 2023-03-24 ENCOUNTER — Ambulatory Visit: Payer: Medicare Other | Admitting: Physical Therapy

## 2023-03-24 DIAGNOSIS — M25572 Pain in left ankle and joints of left foot: Secondary | ICD-10-CM | POA: Diagnosis not present

## 2023-03-24 DIAGNOSIS — R2689 Other abnormalities of gait and mobility: Secondary | ICD-10-CM

## 2023-03-24 NOTE — Patient Instructions (Signed)
Access Code: [email or web address] URL: [email or web address] Date: 03/24/2023 Prepared by: Rosana Hoes  Exercises - Long Sitting Ankle Plantar Flexion with Resistance  - 1 x daily - 3 sets - 20 reps - Long Sitting Ankle Eversion with Resistance  - 1 x daily - 3 sets - 20 reps - Long Sitting Ankle Inversion at Table with Resistance  - 1 x daily - 3 sets - 20 reps - Seated Heel Raise  - 1 x daily - 3 sets - 10 reps - Standing Gastroc Stretch at Counter  - 1 x daily - 3 reps - 30 seconds hold - Heel Raises with Counter Support  - 1 x daily - 3 sets - 10 reps

## 2023-03-25 ENCOUNTER — Encounter: Payer: Self-pay | Admitting: Family Medicine

## 2023-03-26 ENCOUNTER — Other Ambulatory Visit: Payer: Self-pay | Admitting: Family Medicine

## 2023-03-26 ENCOUNTER — Encounter: Payer: Self-pay | Admitting: Family Medicine

## 2023-03-26 MED ORDER — PROMETHAZINE-DM 6.25-15 MG/5ML PO SYRP: 2.5000 mL | ORAL_SOLUTION | Freq: Four times a day (QID) | ORAL | 0 refills | Status: DC | PRN

## 2023-03-27 ENCOUNTER — Ambulatory Visit: Payer: 59

## 2023-03-27 DIAGNOSIS — M25572 Pain in left ankle and joints of left foot: Secondary | ICD-10-CM

## 2023-03-27 DIAGNOSIS — R2689 Other abnormalities of gait and mobility: Secondary | ICD-10-CM

## 2023-03-27 NOTE — Therapy (Signed)
OUTPATIENT PHYSICAL THERAPY TREATMENT NOTE   Patient Name: Frances Medina MRN: [id number] DOB:December 26, 1960, 63 y.o., female Today's Date: 03/27/2023     END OF SESSION:  PT End of Session - 03/27/23 0914     Visit Number 8    Number of Visits 17    Date for PT Re-Evaluation 04/21/23    Authorization Type medicare AB + [employer] state health    Progress Note Due on Visit 16    PT Start Time 0926    PT Stop Time 1008    PT Time Calculation (min) 42 min    Activity Tolerance Patient tolerated treatment well    Behavior During Therapy [facility] for tasks assessed/performed                  Past Medical History:  Diagnosis Date   Acute respiratory failure (HCC)    Acute respiratory failure with hypoxemia (HCC)    Aspiration into airway    Back pain 07/31/2017   Benign neoplasm of rectum    CAP (community acquired pneumonia) 08/05/2015   Depression with anxiety    Diverticulosis    Dysphagia 03/2015    EGD, Dr Leone Payor. mild antral gastritis, ? due to Ibuprofen.  no stricture but empirically maloney dilated esophagus.    Dysphagia, neurologic    E. 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   GERD (gastroesophageal reflux disease)    hiatal hernia   Hypertension    "went away when I stopped smoking"   Knee injury    Left hip pain 02/18/2017   Migraine    "maybe couple times/month" (03/20/2015)   Mild intermittent asthma 06/22/2017   Myasthenia gravis (HCC) 2017   Myasthenia gravis with acute exacerbation (HCC) 05/15/2015   Osteoarthritis of left knee 12/02/2013   Osteoarthritis of right knee 08/30/2013   Pancreatitis 07/25/2017   Protein-calorie malnutrition, severe (HCC) 08/07/2015   Seasonal allergies     takes Zytrec   Tracheostomy status (HCC)    Tubular adenoma of colon    Tumors    "in my stomach"   Umbilical hernia    watching , no plans for surgery at present   Urine incontinence 10/19/2015   VAP (ventilator-associated pneumonia) [facility])    Past Surgical History:  Procedure Laterality Date   BIOPSY  07/05/2019   Procedure: BIOPSY;  Surgeon: Meryl Dare, MD;  Location: [facility] ENDOSCOPY;  Service: Endoscopy;;   CESAREAN SECTION  1987; 1989   COLONOSCOPY WITH PROPOFOL N/A 09/19/2013   Procedure: COLONOSCOPY WITH PROPOFOL;  Surgeon: Meryl Dare, MD;  Location: [facility] ENDOSCOPY;  Service: Endoscopy;  Laterality: N/A;   COLONOSCOPY WITH PROPOFOL N/A 07/05/2019   Procedure: COLONOSCOPY WITH PROPOFOL;  Surgeon: Meryl Dare, MD;  Location: [facility]COPY;  Service: Endoscopy;  Laterality: N/A;   DILATION AND CURETTAGE OF UTERUS     ESOPHAGOGASTRODUODENOSCOPY N/A 08/10/2015   Procedure: ESOPHAGOGASTRODUODENOSCOPY (EGD);  Surgeon: Iva Boop, MD;  Location: [facility] ENDOSCOPY;  Service: Endoscopy;  Laterality: N/A;   ESOPHAGOGASTRODUODENOSCOPY (EGD) WITH PROPOFOL N/A 03/21/2015   Procedure: ESOPHAGOGASTRODUODENOSCOPY (EGD) WITH PROPOFOL;  Surgeon: Iva Boop, MD;  Location: [facility] ENDOSCOPY;  Service: Endoscopy;  Laterality: N/A;   ESOPHAGOGASTRODUODENOSCOPY (EGD)  WITH PROPOFOL N/A 07/05/2019   Procedure: ESOPHAGOGASTRODUODENOSCOPY (EGD) WITH PROPOFOL;  Surgeon: Meryl Dare, MD;  Location: [facility] ENDOSCOPY;  Service: Endoscopy;  Laterality: N/A;   PARTIAL KNEE ARTHROPLASTY Right 08/30/2013   Procedure: RIGHT UNICOMPARTMENTAL KNEE;  Surgeon: Eulas Post, MD;  Location: [facility] OR;  Service: Orthopedics;  Laterality: Right;   PARTIAL KNEE ARTHROPLASTY Left 12/02/2013   Procedure: LEFT KNEE UNI ARTHROPLASTY;  Surgeon: Eulas Post, MD;  Location: [facility];  Service: Orthopedics;  Laterality: Left;   PEG PLACEMENT N/A 08/10/2015   Procedure: PERCUTANEOUS ENDOSCOPIC GASTROSTOMY (PEG)  PLACEMENT;  Surgeon: Iva Boop, MD;  Location: [facility] ENDOSCOPY;  Service: Endoscopy;  Laterality: N/A;   POLYPECTOMY  07/05/2019   Procedure: POLYPECTOMY;  Surgeon: Meryl Dare, MD;  Location: [facility] ENDOSCOPY;  Service: Endoscopy;;   TUBAL LIGATION  1989   VAGINAL HYSTERECTOMY  1990's?   "apparently took out one of my ovaries at the time too cause one's missing"   Patient Active Problem List   Diagnosis Date Noted   Knee pain 03/13/2023   Dizziness 03/13/2023   BMI 50.0-59.9, adult (HCC) 01/26/2023   Lumbar radiculopathy 12/03/2021   Positive ANA (antinuclear antibody) 08/26/2019   History of colonic polyps    Macromastia 06/17/2019   Seasonal allergies 02/18/2019   GAD (generalized anxiety disorder) 08/06/2018   Sinusitis 02/25/2018   Iron deficiency anemia 07/31/2017   Pre-diabetes 07/31/2017   Osteoarthritis of left hip 02/18/2017   OSA on CPAP 09/11/2016   Restrictive lung disease 10/11/2015   Major depression    Headache, migraine    Shortness of breath    Splenomegaly 04/07/2015   Myasthenia gravis (HCC)    Osteoarthritis of left knee 12/02/2013   Benign neoplasm of descending colon 09/19/2013   Osteoarthritis of right knee 08/30/2013   GERD (gastroesophageal reflux disease) 06/23/2013   Fibroids 06/23/2013    PCP: Doreene Eland, MD  REFERRING PROVIDER: Louann Sjogren, DPM  REFERRING DIAG: [phone] (ICD-10-CM) - Tendonitis, Achilles, left  THERAPY DIAG:  Pain in left ankle and joints of left foot  Other abnormalities of gait and mobility  Rationale for Evaluation and Treatment: Rehabilitation  ONSET DATE: a couple months per pt report   SUBJECTIVE:  SUBJECTIVE STATEMENT: Pt presents to PT with reports of no current pain in L ankle. Pt has compliant with HEP with no adverse effect.  EVAL- Cannot recall any precipitating factors, pt states it seems like it is slowly worsening. 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  Does report some L LE numbness and back pain when standing, reports this has been going on since knee surgeries. Tries to walk 3x/week with her sister for past couple weeks. Also reports swelling in her limb since her knee surgery, does not endorse any recent changes with this.   PERTINENT HISTORY: depression/anxiety, dysphagia, Cdiff, HTN, migraines, myasthenia gravis (in remission per pt), OSA   PAIN:  Are you having pain: 8/10 in heel, especially with stretching Location/description: L achilles, lateral heel; burning,cramping at times - aggravating factors: stair navigation, walking ~30 min at a time, driving, footwear - Easing factors: propping up foot, min relief from stretching, footwear  PRECAUTIONS: None  WEIGHT BEARING RESTRICTIONS: No  FALLS:  Has patient fallen in last 6 months? No - reports near falls due to pain   LIVING ENVIRONMENT: Lives w/ husband and  two children 2 story house; 14 steps to second floor with rail, 4STE Pt does majority of housework Tub shower, no rails, no seat Has a walker, doesn't use it  OCCUPATION: not working, on disability since 2016-2017 per pt report  PLOF: Independent - with cane use since knee surgeries  PATIENT GOALS: wants to heal her ankle   OBJECTIVE:  Note: Objective measures were completed at Evaluation unless otherwise noted. PATIENT SURVEYS:  FOTO 63 current, 66 predicted FOTO 03/17/23: 62   COGNITION: Overall cognitive status: Within functional limits for tasks assessed     SENSATION: Light touch intact BIL LE, increased sensitivity to light touch posterior heel and lateral heel L  EDEMA:  Gross swelling noted about L ankle around joint line, no apparent erythema or bruising   PALPATION: Concordant tenderness posterior heel, lateral heel on L. No other muscular or bony tenderness foot or calf   LOWER EXTREMITY ROM:     Active ROM  Right eval Left eval Left 03/17/23   Knee flexion     Knee extension     Ankle dorsiflexion 12 deg 12 deg * 15 deg  Ankle plantarflexion 40 deg 32 deg *   Ankle inversion 29 deg 15 deg * 18 deg   Ankle eversion 15 deg 14 deg * 14 deg    (Blank rows = not tested) Comments:  no pain with PROM (Blank rows = not tested) (Key: WFL = within functional limits not formally assessed, * = concordant pain, s = stiffness/stretching sensation, NT = not tested)  Comments:    LOWER EXTREMITY MMT:  MMT Right eval Left eval Left 03/17/23    Knee flexion     Knee extension     Ankle dorsiflexion 5 4+ 4+  Ankle plantarflexion     Ankle inversion 5 4+ 5  Ankle eversion 5 4+ 5   (Blank rows = not tested) Comments:   (Blank rows = not tested) (Key: WFL = within functional limits not formally assessed, * = concordant pain, s = stiffness/stretching sensation, NT = not tested)  Comments:    LOWER EXTREMITY SPECIAL TESTS:  deferred  FUNCTIONAL TESTS:  5xSTS: 11.96sec without UE support, does increase heel pain  03/17/23: - 5xSTS 10.90 sec R knee pain, gentle UE support   GAIT: Distance walked: within clinic Assistive device utilized: Single point cane and None Level of assistance: Modified independence Comments: antalgic gait LLE, reduced gait speed/cadence   TODAY'S TREATMENT:       [facility] Adult PT Treatment:                                                DATE: 03/27/23 Therapeutic Exercise: NuStep lvl 4 UE/LE x 3 min while taking subjective Longsitting ankle PF with blue band 2x20 Longsitting ankle inv/ev 2x15 blue band Seated heel raises with 15# 3 x 10 L ankle BAPS fwd/bwd and lateral 2 x 2 min lvl 4 Standing heel raises x 30 Eccentric lowering L heel x 20 Standing calf stretch at counter 3 x 30 sec Semi-tandem on of foam 2x30" Semi-tandem ball toss x 15 each Modalities: Cold pack L heel/ankle in longsitting x 10 min  [facility] Adult PT Treatment:  DATE: 03/24/23 Therapeutic Exercise: Standing calf stretch at counter 3 x 30 sec Seated heel raises with 15# 3 x 10 Longsitting ankle PF with blue 2 x 20 Longsitting ankle inversion/eversion with red 2 x 20 each Standing heel raises 3 x 10 Modalities: Cold pack L heel/ankle in longsitting x 5 min   [facility] Adult PT Treatment:                                                DATE: 03/17/23 Therapeutic Exercise: 5-5-5 tempo heel raise, standing 3x5 cues for pacing and form Seated towel scrunches 2x12 cues for form and tripod  Seated toe extensions 2x12 cues for reduced DF compensations and tripod Therapeutic Activity: 5xSTS + eduation FOTO + education Education/discussion re: progress with PT, symptom behavior as it affects activity tolerance, PT goals/POC  Modalities: Cold pack L heel/ankle, prone, 10 min with good relief, no adverse events  [facility] Adult PT Treatment:                                                DATE: 02/04/23 Therapeutic Exercise: Seated green band PF 2x15 seated Seated green band eversion 2x10 Seated green band inversion 2x10 Seated green band DF 2x10 Standing heel raise isos, 3 bouts: 25 sec, 50sec, 54sec  Standing soleus slantboard stretch x8 gently rocking Standing gastroc slantboard stretch x8 gently rocking  Manual Therapy: Prone; STM to gastroc/soleus, plantar aspect of foot; trigger point release gastroc soleus, passive PF with gentle pin and stretch (palm technique) gastroc soleus Modalities: Cold pack, prone; L ankle/heel with good relief, no adverse events 10 min  [facility] Adult PT Treatment:                                                DATE: 02/02/23 Therapeutic Exercise: Seated calf stretch Baps L3  Seated heel raise 5# on knee 10 x 3  Seated red band EV 10 x 2  Seated red band Inv 10 x 2  Seated blue band PF 10 x 2  Standing isometric calf raise hold 1 minute x 2 (45 sec rest break) Standing Gastroc stretch x 2 for 10 sec  SManual  Therapy: TPR to gastroc followed by STW Modalities: Ice pack to heel in prone x 10 min  [facility] Adult PT Treatment:                                                DATE: 01/27/23 Therapeutic Exercise: Seated calf stretch with towel x 2 for 30 sec each  Manual Therapy: TPR x 6 to calf and lateral lower leg.  STW to calf supine and prone Passive gastroc stretch Cross friction to achilles  Modalities: Ice pack to heel/ankle long sitting x 7 min  [facility] Adult PT Treatment:  DATE: 01-26-23 Therapeutic Exercise: Nustep 6 min with UE/LE Slant board stretch 15 sec x 3 on Right painful for pt. Seated heel raise 2 x 8 Isometric hold 45 sec standing heel raise at various angles x 5 with UE on wall  1 minute rest in between Seated hip abduction with RTB 2 x 10 Manual Therapy: STW to L ankle for edema/swelling control                                                                                                                       [facility] Adult PT Treatment:                                                DATE: 01/13/23 Therapeutic Exercise: Ankle inversion/eversion towel slides x8 Seated heel raises x8 HEP handout + education  PATIENT EDUCATION:  Education details: HEP update Person educated: Patient Education method: Explanation, Demonstration, Tactile cues, Verbal cues, Handout Education comprehension: verbalized understanding, returned demonstration, verbal cues required, tactile cues required, and needs further education     HOME EXERCISE PROGRAM: Access Code: [email or web address]   ASSESSMENT: CLINICAL IMPRESSION: Pt was able to complete all prescribed exercises with no adverse effect. Therapy today once again focused on progressing calf and distal ankle strengthening as well as stretching to reduce tension on achilles. She was able to progress with eccentric loading to L achilles. Pt is progressing well with therapy, PT will continue per POC as  prescribed.    EVAL- Patient is a pleasant 63 y.o. woman who was seen today for physical therapy evaluation and treatment for L achilles tendon issues. She endorses difficulty with WB tasks, particularly stair navigation. On exam she demonstrates painful limitations in L ankle mobility and strength, hypersensitivity to light touch at posterior/lateral heel. Symptoms are irritable throughout although she denies any increase in resting pain. Does well with HEP, no increase in symptoms. No adverse events. Recommend trial of skilled PT to address aforementioned deficits with aim of improving functional tolerance and reducing pain with typical activities. Pt departs today's session in no acute distress, all voiced concerns/questions addressed appropriately from PT perspective.      OBJECTIVE IMPAIRMENTS: Abnormal gait, decreased activity tolerance, decreased endurance, decreased mobility, difficulty walking, decreased ROM, decreased strength, improper body mechanics, and pain.   ACTIVITY LIMITATIONS: standing, squatting, stairs, transfers, and locomotion level  PARTICIPATION LIMITATIONS: meal prep, cleaning, laundry, driving, and community activity  PERSONAL FACTORS: Age, Time since onset of injury/illness/exacerbation, and 3+ comorbidities: depression/anxiety, dysphagia, Cdiff, HTN, migraines, myasthenia gravis, tracheostomy, OSA  are also affecting patient's functional outcome.    GOALS: Goals reviewed with patient? Yes  SHORT TERM GOALS: Target date: 02/10/2023  Pt will demonstrate appropriate understanding and performance of initially prescribed HEP in order to facilitate improved independence with management of symptoms.  Baseline: HEP provided on eval 03/17/23: reports good HEP  adherence Goal status: MET  2. Pt will report ability to ambulate at least 30 min with less than 3 pt increase in pain on NPS in order to facilitate improved community navigation.  Baseline: pain with 30 min per pt  report 03/17/23: pt reports she has been walking in grocery stores for over an hour for exercise, does have increased pain in heel Goal status: ONGOING  LONG TERM GOALS: Target date: 04/21/2023 (updated 03/17/23)  Pt will score 66 or greater on FOTO in order to demonstrate improved perception of function due to symptoms.  Baseline: 63 03/17/23: 62 Goal status: ONGOING  2.  Pt will demonstrate grossly symmetrical ankle DF/inversion AROM in order to facilitate improved tolerance to functional movements. Baseline: see ROM chart above 03/17/23: see ROM chart above Goal status: ONGOING  3.  Pt will demonstrate appropriate performance of final prescribed HEP in order to facilitate improved self-management of symptoms post-discharge.   Baseline: initial HEP prescribed 03/17/23: HEP going well per pt Goal status: ONGOING  4.  Pt will be able to perform 5xSTS in less than or equal to 9sec in order to demonstrate reduced fall risk and improved functional independence (MCID 5xSTS = 2.3 sec). Baseline: 11.96sec with increase in pain 03/17/23: 10sec Goal status: ONGOING   PLAN:  PT FREQUENCY: 2x/week   PT DURATION: 5 weeks  PLANNED INTERVENTIONS: 97164- PT Re-evaluation, 97110-Therapeutic exercises, 97530- Therapeutic activity, 97112- Neuromuscular re-education, 97535- Self Care, 16109- Manual therapy, [phone]- Gait training, Patient/Family education, Balance training, Stair training, Taping, Dry Needling, Joint mobilization, Cryotherapy, and Moist heat  PLAN FOR NEXT SESSION: continue along updated POC with emphasis on foot/ankle strength, improving mechanics with functional mobility. Monitor pt report of R knee pain.    Eloy End PT  03/27/23 10:32 AM

## 2023-03-30 ENCOUNTER — Ambulatory Visit: Payer: 59 | Admitting: Physical Therapy

## 2023-03-30 ENCOUNTER — Encounter: Payer: Self-pay | Admitting: Physical Therapy

## 2023-03-30 ENCOUNTER — Other Ambulatory Visit: Payer: Self-pay

## 2023-03-30 DIAGNOSIS — M25572 Pain in left ankle and joints of left foot: Secondary | ICD-10-CM | POA: Diagnosis not present

## 2023-03-30 DIAGNOSIS — R2689 Other abnormalities of gait and mobility: Secondary | ICD-10-CM

## 2023-03-30 NOTE — Therapy (Signed)
OUTPATIENT PHYSICAL THERAPY TREATMENT NOTE   Patient Name: Frances Medina MRN: [id number] DOB:01-03-1961, 63 y.o., female Today's Date: 03/30/2023     END OF SESSION:  PT End of Session - 03/30/23 1348     Visit Number 9    Number of Visits 17    Date for PT Re-Evaluation 04/21/23    Authorization Type medicare AB + [employer] state health    Progress Note Due on Visit 16    PT Start Time 1355    PT Stop Time 1445    PT Time Calculation (min) 50 min    Activity Tolerance Patient tolerated treatment well    Behavior During Therapy [facility] for tasks assessed/performed                   Past Medical History:  Diagnosis Date   Acute respiratory failure (HCC)    Acute respiratory failure with hypoxemia (HCC)    Aspiration into airway    Back pain 07/31/2017   Benign neoplasm of rectum    CAP (community acquired pneumonia) 08/05/2015   Depression with anxiety    Diverticulosis    Dysphagia 03/2015    EGD, Dr Leone Payor. mild antral gastritis, ? due to Ibuprofen.  no stricture but empirically maloney dilated esophagus.    Dysphagia, neurologic    E. 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   GERD (gastroesophageal reflux disease)    hiatal hernia   Hypertension    "went away when I stopped smoking"   Knee injury    Left hip pain 02/18/2017   Migraine    "maybe couple times/month" (03/20/2015)   Mild intermittent asthma 06/22/2017   Myasthenia gravis (HCC) 2017   Myasthenia gravis with acute exacerbation (HCC) 05/15/2015   Osteoarthritis of left knee 12/02/2013   Osteoarthritis of right knee 08/30/2013   Pancreatitis 07/25/2017   Protein-calorie malnutrition, severe (HCC) 08/07/2015   Seasonal allergies     takes Zytrec   Tracheostomy status (HCC)    Tubular adenoma of colon    Tumors    "in my stomach"   Umbilical hernia    watching , no plans for surgery at present   Urine incontinence 10/19/2015   VAP (ventilator-associated pneumonia) [facility])    Past Surgical History:  Procedure Laterality Date   BIOPSY  07/05/2019   Procedure: BIOPSY;  Surgeon: Meryl Dare, MD;  Location: [facility] ENDOSCOPY;  Service: Endoscopy;;   CESAREAN SECTION  1987; 1989   COLONOSCOPY WITH PROPOFOL N/A 09/19/2013   Procedure: COLONOSCOPY WITH PROPOFOL;  Surgeon: Meryl Dare, MD;  Location: [facility] ENDOSCOPY;  Service: Endoscopy;  Laterality: N/A;   COLONOSCOPY WITH PROPOFOL N/A 07/05/2019   Procedure: COLONOSCOPY WITH PROPOFOL;  Surgeon: Meryl Dare, MD;  Location: [facility] ENDOSCOPY;  Service: Endoscopy;  Laterality: N/A;   DILATION AND CURETTAGE OF UTERUS     ESOPHAGOGASTRODUODENOSCOPY N/A 08/10/2015   Procedure: ESOPHAGOGASTRODUODENOSCOPY (EGD);  Surgeon: Iva Boop, MD;  Location: [facility] ENDOSCOPY;  Service: Endoscopy;  Laterality: N/A;   ESOPHAGOGASTRODUODENOSCOPY (EGD) WITH PROPOFOL N/A 03/21/2015   Procedure: ESOPHAGOGASTRODUODENOSCOPY (EGD) WITH PROPOFOL;  Surgeon: Iva Boop, MD;  Location: [facility] ENDOSCOPY;  Service: Endoscopy;  Laterality: N/A;   ESOPHAGOGASTRODUODENOSCOPY (  EGD) WITH PROPOFOL N/A 07/05/2019   Procedure: ESOPHAGOGASTRODUODENOSCOPY (EGD) WITH PROPOFOL;  Surgeon: Meryl Dare, MD;  Location: [facility] ENDOSCOPY;  Service: Endoscopy;  Laterality: N/A;   PARTIAL KNEE ARTHROPLASTY Right 08/30/2013   Procedure: RIGHT UNICOMPARTMENTAL KNEE;  Surgeon: Eulas Post, MD;  Location: [facility] OR;  Service: Orthopedics;  Laterality: Right;   PARTIAL KNEE ARTHROPLASTY Left 12/02/2013   Procedure: LEFT KNEE UNI ARTHROPLASTY;  Surgeon: Eulas Post, MD;  Location: [facility];  Service: Orthopedics;  Laterality: Left;   PEG PLACEMENT N/A 08/10/2015   Procedure: PERCUTANEOUS ENDOSCOPIC GASTROSTOMY (PEG)  PLACEMENT;  Surgeon: Iva Boop, MD;  Location: [facility] ENDOSCOPY;  Service: Endoscopy;  Laterality: N/A;   POLYPECTOMY  07/05/2019   Procedure: POLYPECTOMY;  Surgeon: Meryl Dare, MD;  Location: [facility] ENDOSCOPY;  Service: Endoscopy;;   TUBAL LIGATION  1989   VAGINAL HYSTERECTOMY  1990's?   "apparently took out one of my ovaries at the time too cause one's missing"   Patient Active Problem List   Diagnosis Date Noted   Knee pain 03/13/2023   Dizziness 03/13/2023   BMI 50.0-59.9, adult (HCC) 01/26/2023   Lumbar radiculopathy 12/03/2021   Positive ANA (antinuclear antibody) 08/26/2019   History of colonic polyps    Macromastia 06/17/2019   Seasonal allergies 02/18/2019   GAD (generalized anxiety disorder) 08/06/2018   Sinusitis 02/25/2018   Iron deficiency anemia 07/31/2017   Pre-diabetes 07/31/2017   Osteoarthritis of left hip 02/18/2017   OSA on CPAP 09/11/2016   Restrictive lung disease 10/11/2015   Major depression    Headache, migraine    Shortness of breath    Splenomegaly 04/07/2015   Myasthenia gravis (HCC)    Osteoarthritis of left knee 12/02/2013   Benign neoplasm of descending colon 09/19/2013   Osteoarthritis of right knee 08/30/2013   GERD (gastroesophageal reflux disease) 06/23/2013   Fibroids 06/23/2013    PCP: Doreene Eland, MD  REFERRING PROVIDER: Louann Sjogren, DPM  REFERRING DIAG: [phone] (ICD-10-CM) - Tendonitis, Achilles, left  THERAPY DIAG:  Pain in left ankle and joints of left foot  Other abnormalities of gait and mobility  Rationale for Evaluation and Treatment: Rehabilitation  ONSET DATE: a couple months per pt report   SUBJECTIVE:  SUBJECTIVE STATEMENT: Patient reports she is doing well, she is tired from exercising. She does note that her knees have been hurting.   EVAL- Cannot recall any precipitating factors, pt states it seems like it is slowly worsening. 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  Does report some L LE numbness and back pain when standing, reports this has been going on since knee surgeries. Tries to walk 3x/week with her sister for past couple weeks. Also reports swelling in her limb since her knee surgery, does not endorse any recent changes with this.   PERTINENT HISTORY: depression/anxiety, dysphagia, Cdiff, HTN, migraines, myasthenia gravis (in remission per pt), OSA   PAIN:  Are you having pain: 8/10 in heel, especially with stretching Location/description: L achilles, lateral heel; burning,cramping at times - aggravating factors: stair navigation, walking ~30 min at a time, driving, footwear - Easing factors: propping up foot, min relief from stretching, footwear  PRECAUTIONS: None  WEIGHT BEARING RESTRICTIONS: No  FALLS:  Has patient fallen in last 6 months? No - reports near falls due to pain   LIVING ENVIRONMENT: Lives w/ husband and  two children 2 story house; 14 steps to second floor with rail, 4STE Pt does majority of housework Tub shower, no rails, no seat Has a walker, doesn't use it  OCCUPATION: not working, on disability since 2016-2017 per pt report  PLOF: Independent - with cane use since knee surgeries  PATIENT GOALS: wants to heal her ankle   OBJECTIVE:  Note: Objective measures were completed at Evaluation unless otherwise noted. PATIENT SURVEYS:  FOTO 63 current, 66 predicted FOTO 03/17/23: 62   COGNITION: Overall cognitive status: Within functional limits for tasks assessed     SENSATION: Light touch intact BIL LE, increased sensitivity to light touch posterior heel and lateral heel L  EDEMA:  Gross swelling noted about L ankle around joint line, no apparent erythema or bruising   PALPATION: Concordant tenderness posterior heel, lateral heel on L. No other muscular or bony tenderness foot or calf   LOWER EXTREMITY ROM:     Active ROM  Right eval Left eval Left 03/17/23   Knee flexion     Knee extension     Ankle dorsiflexion 12 deg 12 deg * 15 deg  Ankle plantarflexion 40 deg 32 deg *   Ankle inversion 29 deg 15 deg * 18 deg   Ankle eversion 15 deg 14 deg * 14 deg    (Blank rows = not tested) Comments:  no pain with PROM (Blank rows = not tested) (Key: WFL = within functional limits not formally assessed, * = concordant pain, s = stiffness/stretching sensation, NT = not tested)  Comments:    LOWER EXTREMITY MMT:  MMT Right eval Left eval Left 03/17/23    Knee flexion     Knee extension     Ankle dorsiflexion 5 4+ 4+  Ankle plantarflexion     Ankle inversion 5 4+ 5  Ankle eversion 5 4+ 5   (Blank rows = not tested) Comments:   (Blank rows = not tested) (Key: WFL = within functional limits not formally assessed, * = concordant pain, s = stiffness/stretching sensation, NT = not tested)  Comments:    LOWER EXTREMITY SPECIAL TESTS:  deferred  FUNCTIONAL TESTS:  5xSTS: 11.96sec without UE support, does increase heel pain  03/17/23: - 5xSTS 10.90 sec R knee pain, gentle UE support   GAIT: Distance walked: within clinic Assistive device utilized: Single point cane and None Level of assistance: Modified independence Comments: antalgic gait LLE, reduced gait speed/cadence   TODAY'S TREATMENT:       [facility] Adult PT Treatment:                                                DATE: 03/30/23 Therapeutic Exercise: NuStep L5 x 5 min with UE/LE while taking subjective SLR 2 x 10 each Bridge 2 x 10 Hooklying clamshell with blue 2 x 20 Sidelying hip abduction 2 x 10 each LAQ with 5# 2 x 10 Sit to stand 3 x 10 Seated heel toe raises x 20 Standing heel raises 3 x 10 Tandem stance 3 x 30 sec each Modalities: Cold pack L heel/ankle in longsitting x 10 min   [facility] Adult PT Treatment:  DATE: 03/27/23 Therapeutic Exercise: NuStep lvl 4 UE/LE x 3 min while taking  subjective Longsitting ankle PF with blue band 2x20 Longsitting ankle inv/ev 2x15 blue band Seated heel raises with 15# 3 x 10 L ankle BAPS fwd/bwd and lateral 2 x 2 min lvl 4 Standing heel raises x 30 Eccentric lowering L heel x 20 Standing calf stretch at counter 3 x 30 sec Semi-tandem on of foam 2x30" Semi-tandem ball toss x 15 each Modalities: Cold pack L heel/ankle in longsitting x 10 min  [facility] Adult PT Treatment:                                                DATE: 03/24/23 Therapeutic Exercise: Standing calf stretch at counter 3 x 30 sec Seated heel raises with 15# 3 x 10 Longsitting ankle PF with blue 2 x 20 Longsitting ankle inversion/eversion with red 2 x 20 each Standing heel raises 3 x 10 Modalities: Cold pack L heel/ankle in longsitting x 5 min  [facility] Adult PT Treatment:                                                DATE: 03/17/23 Therapeutic Exercise: 5-5-5 tempo heel raise, standing 3x5 cues for pacing and form Seated towel scrunches 2x12 cues for form and tripod  Seated toe extensions 2x12 cues for reduced DF compensations and tripod Therapeutic Activity: 5xSTS + eduation FOTO + education Education/discussion re: progress with PT, symptom behavior as it affects activity tolerance, PT goals/POC  Modalities: Cold pack L heel/ankle, prone, 10 min with good relief, no adverse events  [facility] Adult PT Treatment:                                                DATE: 02/04/23 Therapeutic Exercise: Seated green band PF 2x15 seated Seated green band eversion 2x10 Seated green band inversion 2x10 Seated green band DF 2x10 Standing heel raise isos, 3 bouts: 25 sec, 50sec, 54sec  Standing soleus slantboard stretch x8 gently rocking Standing gastroc slantboard stretch x8 gently rocking  Manual Therapy: Prone; STM to gastroc/soleus, plantar aspect of foot; trigger point release gastroc soleus, passive PF with gentle pin and stretch (palm technique) gastroc  soleus Modalities: Cold pack, prone; L ankle/heel with good relief, no adverse events 10 min  [facility] Adult PT Treatment:                                                DATE: 02/02/23 Therapeutic Exercise: Seated calf stretch Baps L3  Seated heel raise 5# on knee 10 x 3  Seated red band EV 10 x 2  Seated red band Inv 10 x 2  Seated blue band PF 10 x 2  Standing isometric calf raise hold 1 minute x 2 (45 sec rest break) Standing Gastroc stretch x 2 for 10 sec  SManual Therapy: TPR to gastroc followed by STW Modalities: Ice pack to heel in prone x 10 min  [facility] Adult PT Treatment:                                                DATE: 01/27/23 Therapeutic Exercise: Seated calf stretch with towel x 2 for 30 sec each  Manual Therapy: TPR x 6 to calf and lateral lower leg.  STW to calf supine and prone Passive gastroc stretch Cross friction to achilles  Modalities: Ice pack to heel/ankle long sitting x 7 min  [facility] Adult PT Treatment:                                                DATE: 01-26-23 Therapeutic Exercise: Nustep 6 min with UE/LE Slant board stretch 15 sec x 3 on Right painful for pt. Seated heel raise 2 x 8 Isometric hold 45 sec standing heel raise at various angles x 5 with UE on wall  1 minute rest in between Seated hip abduction with RTB 2 x 10 Manual Therapy: STW to L ankle for edema/swelling control                                                                                                                       [facility] Adult PT Treatment:                                                DATE: 01/13/23 Therapeutic Exercise: Ankle inversion/eversion towel slides x8 Seated heel raises x8 HEP handout + education  PATIENT EDUCATION:  Education details: HEP update Person educated: Patient Education method: Explanation, Demonstration, Tactile cues, Verbal cues, Handout Education comprehension: verbalized understanding, returned demonstration, verbal cues required,  tactile cues required, and needs further education     HOME EXERCISE PROGRAM: Access Code: [email or web address]   ASSESSMENT: CLINICAL IMPRESSION: Patient tolerated therapy well with no adverse effects. Therapy incorporated more hip and LE strengthening with good tolerance. Continued with progressing of calf strengthening and balance training to improve achilles load tolerance and ankle control. Updated her HEP to progress LE strengthening with good tolerance. Patient would benefit from continued skilled PT to progress her ankle strength and activity tolerance to reduce pain and maximize functional ability.    EVAL- Patient is a pleasant 63 y.o. woman who was seen today for physical therapy evaluation and treatment for L achilles tendon issues. She endorses difficulty with WB tasks, particularly stair navigation. On exam she demonstrates painful limitations in L ankle mobility and strength, hypersensitivity to light touch at posterior/lateral heel. Symptoms are irritable throughout although she denies any increase in resting pain. Does well with HEP, no increase in symptoms.  No adverse events. Recommend trial of skilled PT to address aforementioned deficits with aim of improving functional tolerance and reducing pain with typical activities. Pt departs today's session in no acute distress, all voiced concerns/questions addressed appropriately from PT perspective.      OBJECTIVE IMPAIRMENTS: Abnormal gait, decreased activity tolerance, decreased endurance, decreased mobility, difficulty walking, decreased ROM, decreased strength, improper body mechanics, and pain.   ACTIVITY LIMITATIONS: standing, squatting, stairs, transfers, and locomotion level  PARTICIPATION LIMITATIONS: meal prep, cleaning, laundry, driving, and community activity  PERSONAL FACTORS: Age, Time since onset of injury/illness/exacerbation, and 3+ comorbidities: depression/anxiety, dysphagia, Cdiff, HTN, migraines, myasthenia gravis,  tracheostomy, OSA  are also affecting patient's functional outcome.    GOALS: Goals reviewed with patient? Yes  SHORT TERM GOALS: Target date: 02/10/2023  Pt will demonstrate appropriate understanding and performance of initially prescribed HEP in order to facilitate improved independence with management of symptoms.  Baseline: HEP provided on eval 03/17/23: reports good HEP adherence Goal status: MET  2. Pt will report ability to ambulate at least 30 min with less than 3 pt increase in pain on NPS in order to facilitate improved community navigation.  Baseline: pain with 30 min per pt report 03/17/23: pt reports she has been walking in grocery stores for over an hour for exercise, does have increased pain in heel Goal status: ONGOING  LONG TERM GOALS: Target date: 04/21/2023 (updated 03/17/23)  Pt will score 66 or greater on FOTO in order to demonstrate improved perception of function due to symptoms.  Baseline: 63 03/17/23: 62 Goal status: ONGOING  2.  Pt will demonstrate grossly symmetrical ankle DF/inversion AROM in order to facilitate improved tolerance to functional movements. Baseline: see ROM chart above 03/17/23: see ROM chart above Goal status: ONGOING  3.  Pt will demonstrate appropriate performance of final prescribed HEP in order to facilitate improved self-management of symptoms post-discharge.   Baseline: initial HEP prescribed 03/17/23: HEP going well per pt Goal status: ONGOING  4.  Pt will be able to perform 5xSTS in less than or equal to 9sec in order to demonstrate reduced fall risk and improved functional independence (MCID 5xSTS = 2.3 sec). Baseline: 11.96sec with increase in pain 03/17/23: 10sec Goal status: ONGOING   PLAN:  PT FREQUENCY: 2x/week   PT DURATION: 5 weeks  PLANNED INTERVENTIONS: 97164- PT Re-evaluation, 97110-Therapeutic exercises, 97530- Therapeutic activity, 97112- Neuromuscular re-education, 97535- Self Care, 09811- Manual therapy, [id number]-  Gait training, Patient/Family education, Balance training, Stair training, Taping, Dry Needling, Joint mobilization, Cryotherapy, and Moist heat  PLAN FOR NEXT SESSION: continue along updated POC with emphasis on foot/ankle strength, improving mechanics with functional mobility. Monitor pt report of R knee pain.    Rosana Hoes, PT, DPT, LAT, ATC 03/30/23  2:39 PM Phone: [phone] Fax: [phone]

## 2023-03-30 NOTE — Patient Instructions (Signed)
Access Code: [email or web address] URL: [email or web address] Date: 03/30/2023 Prepared by: Rosana Hoes  Exercises - Long Sitting Ankle Plantar Flexion with Resistance  - 1 x daily - 3 sets - 20 reps - Long Sitting Ankle Eversion with Resistance  - 1 x daily - 3 sets - 20 reps - Long Sitting Ankle Inversion at Table with Resistance  - 1 x daily - 3 sets - 20 reps - Seated Heel Raise  - 1 x daily - 3 sets - 10 reps - Standing Gastroc Stretch at Counter  - 1 x daily - 3 reps - 30 seconds hold - Heel Raises with Counter Support  - 1 x daily - 3 sets - 10 reps - Active Straight Leg Raise with Quad Set  - 1 x daily - 2 sets - 10 reps - Bridge  - 1 x daily - 2 sets - 10 reps - Hooklying Clamshell with Resistance  - 1 x daily - 2 sets - 20 reps - Sidelying Hip Abduction  - 1 x daily - 2 sets - 10 reps - Seated Long Arc Quad with Ankle Weight  - 1 x daily - 3 sets - 10 reps - Sit to Stand Without Arm Support  - 1 x daily - 3 sets - 10 reps

## 2023-04-01 ENCOUNTER — Ambulatory Visit: Payer: 59 | Admitting: Physical Therapy

## 2023-04-01 ENCOUNTER — Other Ambulatory Visit: Payer: Self-pay | Admitting: Family Medicine

## 2023-04-01 ENCOUNTER — Encounter: Payer: Self-pay | Admitting: Family Medicine

## 2023-04-01 ENCOUNTER — Telehealth: Payer: Self-pay | Admitting: Student

## 2023-04-01 ENCOUNTER — Encounter: Payer: Self-pay | Admitting: Physical Therapy

## 2023-04-01 DIAGNOSIS — M25572 Pain in left ankle and joints of left foot: Secondary | ICD-10-CM

## 2023-04-01 DIAGNOSIS — R2689 Other abnormalities of gait and mobility: Secondary | ICD-10-CM

## 2023-04-01 MED ORDER — ALBUTEROL SULFATE (2.5 MG/3ML) 0.083% IN NEBU: 2.5000 mg | INHALATION_SOLUTION | Freq: Four times a day (QID) | RESPIRATORY_TRACT | 1 refills | Status: DC | PRN

## 2023-04-01 MED ORDER — ALBUTEROL SULFATE HFA 108 (90 BASE) MCG/ACT IN AERS: INHALATION_SPRAY | RESPIRATORY_TRACT | 3 refills | Status: DC

## 2023-04-01 NOTE — Therapy (Signed)
OUTPATIENT PHYSICAL THERAPY TREATMENT NOTE   Patient Name: Frances Medina MRN: [id number] DOB:10/13/1960, 63 y.o., female Today's Date: 04/01/2023     END OF SESSION:  PT End of Session - 04/01/23 0930     Visit Number 10    Number of Visits 17    Date for PT Re-Evaluation 04/21/23    Authorization Type medicare AB + [employer] state health    Authorization Time Period no auth per appt notes    PT Start Time 0931    PT Stop Time 1020    PT Time Calculation (min) 49 min                   Past Medical History:  Diagnosis Date   Acute respiratory failure (HCC)    Acute respiratory failure with hypoxemia (HCC)    Aspiration into airway    Back pain 07/31/2017   Benign neoplasm of rectum    CAP (community acquired pneumonia) 08/05/2015   Depression with anxiety    Diverticulosis    Dysphagia 03/2015    EGD, Dr Leone Payor. mild antral gastritis, ? due to Ibuprofen.  no stricture but empirically maloney dilated esophagus.    Dysphagia, neurologic    E. 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   GERD (gastroesophageal reflux disease)    hiatal hernia   Hypertension    "went away when I stopped smoking"   Knee injury    Left hip pain 02/18/2017   Migraine    "maybe couple times/month" (03/20/2015)   Mild intermittent asthma 06/22/2017   Myasthenia gravis (HCC) 2017   Myasthenia gravis with acute exacerbation (HCC) 05/15/2015   Osteoarthritis of left knee 12/02/2013   Osteoarthritis of right knee 08/30/2013   Pancreatitis 07/25/2017   Protein-calorie malnutrition, severe (HCC) 08/07/2015   Seasonal allergies    takes Zytrec   Tracheostomy status (HCC)    Tubular adenoma of colon    Tumors    "in my  stomach"   Umbilical hernia    watching , no plans for surgery at present   Urine incontinence 10/19/2015   VAP (ventilator-associated pneumonia) [facility])    Past Surgical History:  Procedure Laterality Date   BIOPSY  07/05/2019   Procedure: BIOPSY;  Surgeon: Meryl Dare, MD;  Location: [facility];  Service: Endoscopy;;   CESAREAN SECTION  1987; 1989   COLONOSCOPY WITH PROPOFOL N/A 09/19/2013   Procedure: COLONOSCOPY WITH PROPOFOL;  Surgeon: Meryl Dare, MD;  Location: [facility];  Service: Endoscopy;  Laterality: N/A;   COLONOSCOPY WITH PROPOFOL N/A 07/05/2019   Procedure: COLONOSCOPY WITH PROPOFOL;  Surgeon: Meryl Dare, MD;  Location: [facility];  Service: Endoscopy;  Laterality: N/A;   DILATION AND CURETTAGE OF UTERUS     ESOPHAGOGASTRODUODENOSCOPY N/A 08/10/2015   Procedure: ESOPHAGOGASTRODUODENOSCOPY (EGD);  Surgeon: Iva Boop, MD;  Location: [facility] ENDOSCOPY;  Service: Endoscopy;  Laterality: N/A;   ESOPHAGOGASTRODUODENOSCOPY (EGD) WITH PROPOFOL N/A 03/21/2015   Procedure: ESOPHAGOGASTRODUODENOSCOPY (EGD) WITH PROPOFOL;  Surgeon: Iva Boop, MD;  Location: [facility] ENDOSCOPY;  Service: Endoscopy;  Laterality: N/A;   ESOPHAGOGASTRODUODENOSCOPY (EGD) WITH PROPOFOL N/A 07/05/2019   Procedure: ESOPHAGOGASTRODUODENOSCOPY (EGD) WITH PROPOFOL;  Surgeon: Meryl Dare,  MD;  Location: [facility];  Service: Endoscopy;  Laterality: N/A;   PARTIAL KNEE ARTHROPLASTY Right 08/30/2013   Procedure: RIGHT UNICOMPARTMENTAL KNEE;  Surgeon: Eulas Post, MD;  Location: [facility] OR;  Service: Orthopedics;  Laterality: Right;   PARTIAL KNEE ARTHROPLASTY Left 12/02/2013   Procedure: LEFT KNEE UNI ARTHROPLASTY;  Surgeon: Eulas Post, MD;  Location: [facility];  Service: Orthopedics;  Laterality: Left;   PEG PLACEMENT N/A 08/10/2015   Procedure: PERCUTANEOUS ENDOSCOPIC GASTROSTOMY (PEG) PLACEMENT;  Surgeon: Iva Boop, MD;  Location: [facility] ENDOSCOPY;  Service: Endoscopy;  Laterality:  N/A;   POLYPECTOMY  07/05/2019   Procedure: POLYPECTOMY;  Surgeon: Meryl Dare, MD;  Location: [facility];  Service: Endoscopy;;   TUBAL LIGATION  1989   VAGINAL HYSTERECTOMY  1990's?   "apparently took out one of my ovaries at the time too cause one's missing"   Patient Active Problem List   Diagnosis Date Noted   Knee pain 03/13/2023   Dizziness 03/13/2023   BMI 50.0-59.9, adult (HCC) 01/26/2023   Lumbar radiculopathy 12/03/2021   Positive ANA (antinuclear antibody) 08/26/2019   History of colonic polyps    Macromastia 06/17/2019   Seasonal allergies 02/18/2019   GAD (generalized anxiety disorder) 08/06/2018   Sinusitis 02/25/2018   Iron deficiency anemia 07/31/2017   Pre-diabetes 07/31/2017   Osteoarthritis of left hip 02/18/2017   OSA on CPAP 09/11/2016   Restrictive lung disease 10/11/2015   Major depression    Headache, migraine    Shortness of breath    Splenomegaly 04/07/2015   Myasthenia gravis (HCC)    Osteoarthritis of left knee 12/02/2013   Benign neoplasm of descending colon 09/19/2013   Osteoarthritis of right knee 08/30/2013   GERD (gastroesophageal reflux disease) 06/23/2013   Fibroids 06/23/2013    PCP: Doreene Eland, MD  REFERRING PROVIDER: Louann Sjogren, DPM  REFERRING DIAG: [phone] (ICD-10-CM) - Tendonitis, Achilles, left  THERAPY DIAG:  Pain in left ankle and joints of left foot  Other abnormalities of gait and mobility  Rationale for Evaluation and Treatment: Rehabilitation  ONSET DATE: a couple months per pt report   SUBJECTIVE:  SUBJECTIVE STATEMENT: Left achilles feels good right now. Nike Air Max shoes, recommended by doctor have helped the last few weeks. Both knees hurt when I go to get up after sitting.     EVAL- Cannot recall any precipitating factors, pt states it seems like it is slowly worsening. 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  Does report some L LE numbness and back pain when standing, reports this has been going on since knee surgeries. Tries to walk 3x/week with her sister for past couple weeks. Also reports swelling in her limb since her knee surgery, does not endorse any recent changes with this.   PERTINENT HISTORY: depression/anxiety, dysphagia, Cdiff, HTN, migraines, myasthenia gravis (in remission per pt), OSA   PAIN:  Are you having pain: 8/10 in heel, especially with stretching Location/description: L achilles, lateral heel; burning,cramping at times - aggravating factors: stair navigation, walking ~30 min at a time, driving, footwear - Easing factors: propping up foot, min relief from stretching, footwear  PRECAUTIONS: None  WEIGHT BEARING RESTRICTIONS: No  FALLS:  Has patient fallen in last 6 months? No - reports near falls due to pain   LIVING ENVIRONMENT: Lives w/ husband and two children 2 story house;  14 steps to second floor with rail, 4STE Pt does majority of housework Tub shower, no rails, no seat Has a walker, doesn't use it  OCCUPATION: not working, on disability since 2016-2017 per pt report  PLOF: Independent - with cane use since knee surgeries  PATIENT GOALS: wants to heal her ankle   OBJECTIVE:  Note: Objective measures were completed at Evaluation unless otherwise noted. PATIENT SURVEYS:  FOTO 63 current, 66 predicted FOTO 03/17/23: 62   COGNITION: Overall cognitive status: Within functional limits for tasks assessed     SENSATION: Light touch intact BIL LE, increased sensitivity to light touch posterior heel and lateral heel L  EDEMA:  Gross swelling noted about L ankle around joint line, no apparent erythema or bruising   PALPATION: Concordant tenderness posterior heel, lateral heel on L. No other muscular or bony tenderness foot or calf   LOWER EXTREMITY ROM:     Active ROM Right eval Left eval Left 03/17/23   Knee  flexion     Knee extension     Ankle dorsiflexion 12 deg 12 deg * 15 deg  Ankle plantarflexion 40 deg 32 deg *   Ankle inversion 29 deg 15 deg * 18 deg   Ankle eversion 15 deg 14 deg * 14 deg    (Blank rows = not tested) Comments:  no pain with PROM (Blank rows = not tested) (Key: WFL = within functional limits not formally assessed, * = concordant pain, s = stiffness/stretching sensation, NT = not tested)  Comments:    LOWER EXTREMITY MMT:  MMT Right eval Left eval Left 03/17/23    Knee flexion     Knee extension     Ankle dorsiflexion 5 4+ 4+  Ankle plantarflexion     Ankle inversion 5 4+ 5  Ankle eversion 5 4+ 5   (Blank rows = not tested) Comments:   (Blank rows = not tested) (Key: WFL = within functional limits not formally assessed, * = concordant pain, s = stiffness/stretching sensation, NT = not tested)  Comments:    LOWER EXTREMITY SPECIAL TESTS:  deferred  FUNCTIONAL TESTS:  5xSTS: 11.96sec without UE support, does increase heel pain  03/17/23: - 5xSTS 10.90 sec R knee pain, gentle UE support   GAIT: Distance walked: within clinic Assistive device utilized: Single point cane and None Level of assistance: Modified independence Comments: antalgic gait LLE, reduced gait speed/cadence   TODAY'S TREATMENT:       [facility] Adult PT Treatment:                                                DATE: 04/01/23 Therapeutic Exercise: Nustep L5 x 5 minutes  Heel raise 10 x 3  Tandem stance  x 60 sec each - added head turns for challenge Alternating march Alternating h/s curls  F.M squats  x 10 LAQ 6# 10 x 3  STS 3 x 10 from chair  Side hip abduction 10 x 2 each  SLR 10 x 2 each  Modalities: Ice pack 10 min ankle longsitting    [facility] Adult PT Treatment:  DATE: 03/30/23 Therapeutic Exercise: NuStep L5 x 5 min with UE/LE while taking subjective SLR 2 x 10 each Bridge 2 x 10 Hooklying clamshell with blue 2 x 20 Sidelying  hip abduction 2 x 10 each LAQ with 5# 2 x 10 Sit to stand 3 x 10 Seated heel toe raises x 20 Standing heel raises 3 x 10 Tandem stance 3 x 30 sec each Modalities: Cold pack L heel/ankle in longsitting x 10 min   [facility] Adult PT Treatment:                                                DATE: 03/27/23 Therapeutic Exercise: NuStep lvl 4 UE/LE x 3 min while taking subjective Longsitting ankle PF with blue band 2x20 Longsitting ankle inv/ev 2x15 blue band Seated heel raises with 15# 3 x 10 L ankle BAPS fwd/bwd and lateral 2 x 2 min lvl 4 Standing heel raises x 30 Eccentric lowering L heel x 20 Standing calf stretch at counter 3 x 30 sec Semi-tandem on of foam 2x30" Semi-tandem ball toss x 15 each Modalities: Cold pack L heel/ankle in longsitting x 10 min  [facility] Adult PT Treatment:                                                DATE: 03/24/23 Therapeutic Exercise: Standing calf stretch at counter 3 x 30 sec Seated heel raises with 15# 3 x 10 Longsitting ankle PF with blue 2 x 20 Longsitting ankle inversion/eversion with red 2 x 20 each Standing heel raises 3 x 10 Modalities: Cold pack L heel/ankle in longsitting x 5 min  [facility] Adult PT Treatment:                                                DATE: 03/17/23 Therapeutic Exercise: 5-5-5 tempo heel raise, standing 3x5 cues for pacing and form Seated towel scrunches 2x12 cues for form and tripod  Seated toe extensions 2x12 cues for reduced DF compensations and tripod Therapeutic Activity: 5xSTS + eduation FOTO + education Education/discussion re: progress with PT, symptom behavior as it affects activity tolerance, PT goals/POC  Modalities: Cold pack L heel/ankle, prone, 10 min with good relief, no adverse events    PATIENT EDUCATION:  Education details: HEP update Person educated: Patient Education method: Explanation, Demonstration, Tactile cues, Verbal cues, Handout Education comprehension: verbalized understanding, returned  demonstration, verbal cues required, tactile cues required, and needs further education     HOME EXERCISE PROGRAM: Access Code: [email or web address]   ASSESSMENT: CLINICAL IMPRESSION: Patient tolerated therapy well with no adverse effects. Therapy incorporated more hip and LE strengthening with good tolerance. She reports overall improvement in achilles however her upper and lower legs hurt with prolonged walking.  Continued with progressing of calf strengthening and balance training to improve achilles load tolerance and ankle control. No additions to HEP this visits.  Patient would benefit from continued skilled PT to progress her ankle strength and activity tolerance to reduce pain and maximize functional ability.    EVAL- Patient is a pleasant 63 y.o. woman who was seen  today for physical therapy evaluation and treatment for L achilles tendon issues. She endorses difficulty with WB tasks, particularly stair navigation. On exam she demonstrates painful limitations in L ankle mobility and strength, hypersensitivity to light touch at posterior/lateral heel. Symptoms are irritable throughout although she denies any increase in resting pain. Does well with HEP, no increase in symptoms. No adverse events. Recommend trial of skilled PT to address aforementioned deficits with aim of improving functional tolerance and reducing pain with typical activities. Pt departs today's session in no acute distress, all voiced concerns/questions addressed appropriately from PT perspective.      OBJECTIVE IMPAIRMENTS: Abnormal gait, decreased activity tolerance, decreased endurance, decreased mobility, difficulty walking, decreased ROM, decreased strength, improper body mechanics, and pain.   ACTIVITY LIMITATIONS: standing, squatting, stairs, transfers, and locomotion level  PARTICIPATION LIMITATIONS: meal prep, cleaning, laundry, driving, and community activity  PERSONAL FACTORS: Age, Time since onset of  injury/illness/exacerbation, and 3+ comorbidities: depression/anxiety, dysphagia, Cdiff, HTN, migraines, myasthenia gravis, tracheostomy, OSA  are also affecting patient's functional outcome.    GOALS: Goals reviewed with patient? Yes  SHORT TERM GOALS: Target date: 02/10/2023  Pt will demonstrate appropriate understanding and performance of initially prescribed HEP in order to facilitate improved independence with management of symptoms.  Baseline: HEP provided on eval 03/17/23: reports good HEP adherence Goal status: MET  2. Pt will report ability to ambulate at least 30 min with less than 3 pt increase in pain on NPS in order to facilitate improved community navigation.  Baseline: pain with 30 min per pt report 03/17/23: pt reports she has been walking in grocery stores for over an hour for exercise, does have increased pain in heel Goal status: ONGOING  LONG TERM GOALS: Target date: 04/21/2023 (updated 03/17/23)  Pt will score 66 or greater on FOTO in order to demonstrate improved perception of function due to symptoms.  Baseline: 63 03/17/23: 62 Goal status: ONGOING  2.  Pt will demonstrate grossly symmetrical ankle DF/inversion AROM in order to facilitate improved tolerance to functional movements. Baseline: see ROM chart above 03/17/23: see ROM chart above Goal status: ONGOING  3.  Pt will demonstrate appropriate performance of final prescribed HEP in order to facilitate improved self-management of symptoms post-discharge.   Baseline: initial HEP prescribed 03/17/23: HEP going well per pt Goal status: ONGOING  4.  Pt will be able to perform 5xSTS in less than or equal to 9sec in order to demonstrate reduced fall risk and improved functional independence (MCID 5xSTS = 2.3 sec). Baseline: 11.96sec with increase in pain 03/17/23: 10sec Goal status: ONGOING   PLAN:  PT FREQUENCY: 2x/week   PT DURATION: 5 weeks  PLANNED INTERVENTIONS: 97164- PT Re-evaluation, 97110-Therapeutic  exercises, 97530- Therapeutic activity, 97112- Neuromuscular re-education, 97535- Self Care, 86578- Manual therapy, [id number]- Gait training, Patient/Family education, Balance training, Stair training, Taping, Dry Needling, Joint mobilization, Cryotherapy, and Moist heat  PLAN FOR NEXT SESSION: continue along updated POC with emphasis on foot/ankle strength, improving mechanics with functional mobility. Monitor pt report of R knee pain.    Jannette Spanner, PTA 04/01/23 1:20 PM Phone: [phone] Fax: [phone]

## 2023-04-01 NOTE — Telephone Encounter (Signed)
**  After Hours/ Emergency Line Call**  Received a call to report that Frances Medina states that she ran out of her albuterol inhaler. Endorsing some coughing and took some cough medicine.  Denying any shortness of breath or fevers.  Recommended that I will send message to PCP about sending in albuterol inhaler if necessary.  Red flags discussed.  Will forward to PCP.  Levin Erp, MD PGY-3, [facility]

## 2023-04-02 ENCOUNTER — Other Ambulatory Visit: Payer: Self-pay | Admitting: *Deleted

## 2023-04-02 ENCOUNTER — Other Ambulatory Visit: Payer: Self-pay | Admitting: Family Medicine

## 2023-04-02 ENCOUNTER — Encounter: Payer: Self-pay | Admitting: *Deleted

## 2023-04-02 DIAGNOSIS — Z789 Other specified health status: Secondary | ICD-10-CM

## 2023-04-02 NOTE — Patient Instructions (Signed)
Visit Information  Thank you for taking time to visit with me today. Please don't hesitate to contact me if I can be of assistance to you before our next scheduled telephone appointment.  Following are the goals we discussed today:   Goals Addressed             This Visit's Progress    RNCM Care Management Expected Outcomes: Monitor, Self-Manage and Reduce Symptoms of: ILD       Current Barriers:  Chronic Disease Management support and education needs related to Restrictive Lung Disease/Myasthenia Gravis   RNCM Clinical Goal(s):  Patient will verbalize basic understanding of  Restrictive Lung Disease/Myasthenia Gravis disease process and self health management plan as evidenced by verbal explanation, recognizing symptoms and lifestyle modifications attend all scheduled medical appointments: with primary care provider and specialist as evidenced by keeping all scheduled appointments demonstrate Improved and Ongoing adherence to prescribed treatment plan for Restrictive Lung Disease/Myasthenia Gravis as evidenced by consistent medication compliance, symptom monitoring, continued lifestyle changes continue to work with RN Care Manager to address care management and care coordination needs related to  Restrictive Lung Disease/Myasthenia Gravis as evidenced by adherence to CM Team Scheduled appointments work with community resource care guide to address needs related to  [employer] knowledge of community resource: silver sneakers and alternative programs as evidenced by patient and/or community resource care guide support through collaboration with Medical illustrator, provider, and care team.   Interventions: Evaluation of current treatment plan related to  self management and patient's adherence to plan as established by provider   Restrictive Lung Disease/Myasthenia Gravis  (Status:  Goal on track:  Yes.)  Long Term Goal Evaluation of current treatment plan related to restrictive lung  diease/myasthenia gravis and patient's adherence to plan as established by provider Reviewed medications with patient and discussed compliance with all medications Implement breathing exercises Continue with PT to develop a tailored exercise programs to improve muscle strength and endurance Care Guide referral for Silver Sneakers and alternative programs Discussed plans with patient for ongoing care management follow up and provided patient with direct contact information for care management team Advised patient to discuss acute changes with provider Screening for signs and symptoms of depression related to chronic disease state  Assessed social determinant of health barriers  Patient Goals/Self-Care Activities: Take all medications as prescribed Attend all scheduled provider appointments Call pharmacy for medication refills 3-7 days in advance of running out of medications Attend church or other social activities Perform all self care activities independently  Perform IADL's (shopping, preparing meals, housekeeping, managing finances) independently Call provider office for new concerns or questions   Follow Up Plan:  Telephone follow up appointment with care management team member scheduled for:  05-05-2023 at 9:30 am           Our next appointment is by telephone on 05-05-2023 at 9:30 am  Please call the care guide team at [phone] if you need to cancel or reschedule your appointment.   If you are experiencing a Mental Health or Behavioral Health Crisis or need someone to talk to, please call the Suicide and Crisis Lifeline: 988 call the [place] National Suicide Prevention Lifeline: 660-825-8100 or TTY: 320-650-3318 TTY 515-043-3038) to talk to a trained counselor call 1-800-273-TALK (toll free, 24 hour hotline) go to [facility] [address], [place] [phone])   Patient verbalizes understanding of instructions and care plan provided  today and agrees to view in MyChart. Active MyChart status  and patient understanding of how to access instructions and care plan via MyChart confirmed with patient.     Telephone follow up appointment with care management team member scheduled for:05-05-2023 at 9:30 am  Larey Brick, BSN RN [facility], [facility] RN Care Manager Direct Dial: [phone]  Fax: [phone]

## 2023-04-02 NOTE — Patient Outreach (Signed)
  Care Management   Visit Note  04/02/2023 Name: MAXIMINA PIROZZI MRN: [id number] DOB: [birth year]  Subjective: [name] is a 63 y.o. year old female who is a primary care patient of Doreene Eland, MD. The Care Management team was consulted for assistance.      Engaged with patient spoke with patient by telephone.    Goals Addressed             This Visit's Progress    RNCM Care Management Expected Outcomes: Monitor, Self-Manage and Reduce Symptoms of: ILD       Current Barriers:  Chronic Disease Management support and education needs related to Restrictive Lung Disease/Myasthenia Gravis   RNCM Clinical Goal(s):  Patient will verbalize basic understanding of  Restrictive Lung Disease/Myasthenia Gravis disease process and self health management plan as evidenced by verbal explanation, recognizing symptoms and lifestyle modifications attend all scheduled medical appointments: with primary care provider and specialist as evidenced by keeping all scheduled appointments demonstrate Improved and Ongoing adherence to prescribed treatment plan for Restrictive Lung Disease/Myasthenia Gravis as evidenced by consistent medication compliance, symptom monitoring, continued lifestyle changes continue to work with RN Care Manager to address care management and care coordination needs related to  Restrictive Lung Disease/Myasthenia Gravis as evidenced by adherence to CM Team Scheduled appointments work with community resource care guide to address needs related to  [employer] knowledge of community resource: silver sneakers and alternative programs as evidenced by patient and/or community resource care guide support through collaboration with Medical illustrator, provider, and care team.   Interventions: Evaluation of current treatment plan related to  self management and patient's adherence to plan as established by provider   Restrictive Lung Disease/Myasthenia Gravis  (Status:  Goal on track:  Yes.)   Long Term Goal Evaluation of current treatment plan related to restrictive lung diease/myasthenia gravis and patient's adherence to plan as established by provider Reviewed medications with patient and discussed compliance with all medications Implement breathing exercises Continue with PT to develop a tailored exercise programs to improve muscle strength and endurance Care Guide referral for Silver Sneakers and alternative programs Discussed plans with patient for ongoing care management follow up and provided patient with direct contact information for care management team Advised patient to discuss acute changes with provider Screening for signs and symptoms of depression related to chronic disease state  Assessed social determinant of health barriers  Patient Goals/Self-Care Activities: Take all medications as prescribed Attend all scheduled provider appointments Call pharmacy for medication refills 3-7 days in advance of running out of medications Attend church or other social activities Perform all self care activities independently  Perform IADL's (shopping, preparing meals, housekeeping, managing finances) independently Call provider office for new concerns or questions   Follow Up Plan:  Telephone follow up appointment with care management team member scheduled for:  05-05-2023 at 9:30 am           Consent to Services:  Patient was given information about care management services, agreed to services, and gave verbal consent to participate.   Plan: Telephone follow up appointment with care management team member scheduled for:05-05-2023 at 9:30 am  Larey Brick, BSN RN [facility], [facility] RN Care Manager Direct Dial: [phone]  Fax: [phone]

## 2023-04-06 ENCOUNTER — Encounter: Payer: Self-pay | Admitting: Physical Therapy

## 2023-04-06 ENCOUNTER — Other Ambulatory Visit: Payer: Self-pay

## 2023-04-06 ENCOUNTER — Ambulatory Visit: Payer: Medicare Other | Attending: Podiatry | Admitting: Physical Therapy

## 2023-04-06 DIAGNOSIS — R2689 Other abnormalities of gait and mobility: Secondary | ICD-10-CM | POA: Diagnosis present

## 2023-04-06 DIAGNOSIS — M25572 Pain in left ankle and joints of left foot: Secondary | ICD-10-CM | POA: Insufficient documentation

## 2023-04-06 NOTE — Therapy (Signed)
OUTPATIENT PHYSICAL THERAPY TREATMENT NOTE   Patient Name: Frances Medina MRN: [id number] DOB:1960/09/10, 63 y.o., female Today's Date: 04/06/2023     END OF SESSION:  PT End of Session - 04/06/23 0844     Visit Number 11    Number of Visits 17    Date for PT Re-Evaluation 04/21/23    Authorization Type medicare AB + [employer] state health    Progress Note Due on Visit 16    PT Start Time 0845    PT Stop Time 0935    PT Time Calculation (min) 50 min    Activity Tolerance Patient tolerated treatment well    Behavior During Therapy [facility] for tasks assessed/performed                    Past Medical History:  Diagnosis Date   Acute respiratory failure (HCC)    Acute respiratory failure with hypoxemia (HCC)    Aspiration into airway    Back pain 07/31/2017   Benign neoplasm of rectum    CAP (community acquired pneumonia) 08/05/2015   Depression with anxiety    Diverticulosis    Dysphagia 03/2015    EGD, Dr Leone Payor. mild antral gastritis, ? due to Ibuprofen.  no stricture but empirically maloney dilated esophagus.    Dysphagia, neurologic    E. 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   GERD (gastroesophageal reflux disease)    hiatal hernia   Hypertension    "went away when I stopped smoking"   Knee injury    Left hip pain 02/18/2017   Migraine    "maybe couple times/month" (03/20/2015)   Mild intermittent asthma 06/22/2017   Myasthenia gravis (HCC) 2017   Myasthenia gravis with acute exacerbation (HCC) 05/15/2015   Osteoarthritis of left knee 12/02/2013   Osteoarthritis of right knee 08/30/2013   Pancreatitis 07/25/2017   Protein-calorie malnutrition, severe (HCC) 08/07/2015   Seasonal  allergies    takes Zytrec   Tracheostomy status (HCC)    Tubular adenoma of colon    Tumors    "in my stomach"   Umbilical hernia    watching , no plans for surgery at present   Urine incontinence 10/19/2015   VAP (ventilator-associated pneumonia) [facility])    Past Surgical History:  Procedure Laterality Date   BIOPSY  07/05/2019   Procedure: BIOPSY;  Surgeon: Meryl Dare, MD;  Location: [facility] ENDOSCOPY;  Service: Endoscopy;;   CESAREAN SECTION  1987; 1989   COLONOSCOPY WITH PROPOFOL N/A 09/19/2013   Procedure: COLONOSCOPY WITH PROPOFOL;  Surgeon: Meryl Dare, MD;  Location: [facility] ENDOSCOPY;  Service: Endoscopy;  Laterality: N/A;   COLONOSCOPY WITH PROPOFOL N/A 07/05/2019   Procedure: COLONOSCOPY WITH PROPOFOL;  Surgeon: Meryl Dare, MD;  Location: [facility] ENDOSCOPY;  Service: Endoscopy;  Laterality: N/A;   DILATION AND CURETTAGE OF UTERUS     ESOPHAGOGASTRODUODENOSCOPY N/A 08/10/2015   Procedure: ESOPHAGOGASTRODUODENOSCOPY (EGD);  Surgeon: Iva Boop, MD;  Location: [facility] ENDOSCOPY;  Service: Endoscopy;  Laterality: N/A;   ESOPHAGOGASTRODUODENOSCOPY (EGD) WITH PROPOFOL N/A 03/21/2015   Procedure: ESOPHAGOGASTRODUODENOSCOPY (EGD) WITH PROPOFOL;  Surgeon: Iva Boop, MD;  Location: [facility] ENDOSCOPY;  Service: Endoscopy;  Laterality: N/A;  ESOPHAGOGASTRODUODENOSCOPY (EGD) WITH PROPOFOL N/A 07/05/2019   Procedure: ESOPHAGOGASTRODUODENOSCOPY (EGD) WITH PROPOFOL;  Surgeon: Meryl Dare, MD;  Location: [facility] ENDOSCOPY;  Service: Endoscopy;  Laterality: N/A;   PARTIAL KNEE ARTHROPLASTY Right 08/30/2013   Procedure: RIGHT UNICOMPARTMENTAL KNEE;  Surgeon: Eulas Post, MD;  Location: [facility] OR;  Service: Orthopedics;  Laterality: Right;   PARTIAL KNEE ARTHROPLASTY Left 12/02/2013   Procedure: LEFT KNEE UNI ARTHROPLASTY;  Surgeon: Eulas Post, MD;  Location: [facility];  Service: Orthopedics;  Laterality: Left;   PEG PLACEMENT N/A 08/10/2015   Procedure: PERCUTANEOUS ENDOSCOPIC GASTROSTOMY  (PEG) PLACEMENT;  Surgeon: Iva Boop, MD;  Location: [facility] ENDOSCOPY;  Service: Endoscopy;  Laterality: N/A;   POLYPECTOMY  07/05/2019   Procedure: POLYPECTOMY;  Surgeon: Meryl Dare, MD;  Location: [facility] ENDOSCOPY;  Service: Endoscopy;;   TUBAL LIGATION  1989   VAGINAL HYSTERECTOMY  1990's?   "apparently took out one of my ovaries at the time too cause one's missing"   Patient Active Problem List   Diagnosis Date Noted   Knee pain 03/13/2023   Dizziness 03/13/2023   BMI 50.0-59.9, adult (HCC) 01/26/2023   Lumbar radiculopathy 12/03/2021   Positive ANA (antinuclear antibody) 08/26/2019   History of colonic polyps    Macromastia 06/17/2019   Seasonal allergies 02/18/2019   GAD (generalized anxiety disorder) 08/06/2018   Sinusitis 02/25/2018   Iron deficiency anemia 07/31/2017   Pre-diabetes 07/31/2017   Osteoarthritis of left hip 02/18/2017   OSA on CPAP 09/11/2016   Restrictive lung disease 10/11/2015   Major depression    Headache, migraine    Shortness of breath    Splenomegaly 04/07/2015   Myasthenia gravis (HCC)    Osteoarthritis of left knee 12/02/2013   Benign neoplasm of descending colon 09/19/2013   Osteoarthritis of right knee 08/30/2013   GERD (gastroesophageal reflux disease) 06/23/2013   Fibroids 06/23/2013    PCP: Doreene Eland, MD  REFERRING PROVIDER: Louann Sjogren, DPM  REFERRING DIAG: [phone] (ICD-10-CM) - Tendonitis, Achilles, left  THERAPY DIAG:  Pain in left ankle and joints of left foot  Other abnormalities of gait and mobility  Rationale for Evaluation and Treatment: Rehabilitation  ONSET DATE: a couple months per pt report   SUBJECTIVE:  SUBJECTIVE STATEMENT: Patient reports left achilles has been doing good. She did feel it a little on the inside this morning when putting her shoes on. She has been dealing with hives over the weekend and her right knee continues to bother her.     EVAL- Cannot recall any precipitating factors, pt  states it seems like it is slowly worsening. 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  Does report some L LE numbness and back pain when standing, reports this has been going on since knee surgeries. Tries to walk 3x/week with her sister for past couple weeks. Also reports swelling in her limb since her knee surgery, does not endorse any recent changes with this.   PERTINENT HISTORY: depression/anxiety, dysphagia, Cdiff, HTN, migraines, myasthenia gravis (in remission per pt), OSA   PAIN:  Are you having pain: Yes 7/10 in heel, especially with stretching Location/description: L achilles, lateral heel; burning,cramping at times - aggravating factors: stair navigation, walking ~30 min at a time, driving, footwear - Easing factors: propping up foot, min relief from stretching, footwear  PRECAUTIONS: None  WEIGHT BEARING RESTRICTIONS: No  FALLS:  Has patient fallen in last 6 months? No - reports near falls due to pain   LIVING ENVIRONMENT: Lives w/ husband and two children 2 story house; 14 steps to second floor with rail, 4STE Pt does majority of housework Tub shower, no rails, no seat Has a walker, doesn't use it  OCCUPATION: not working, on disability since 2016-2017 per pt report  PLOF: Independent - with cane use since knee surgeries  PATIENT GOALS: wants to heal her ankle   OBJECTIVE:  Note: Objective measures were completed at Evaluation unless otherwise noted. PATIENT SURVEYS:  FOTO 63 current, 66 predicted 03/17/23: 62   SENSATION: Light touch intact BIL LE, increased sensitivity to light touch posterior heel and lateral heel L  EDEMA:  Gross swelling noted about L ankle around joint line, no apparent erythema or bruising   PALPATION: Concordant tenderness posterior heel, lateral heel on L. No other muscular or bony tenderness foot or calf   LOWER  EXTREMITY ROM:     Active ROM Right eval Left eval Left 03/17/23   Knee flexion     Knee extension     Ankle dorsiflexion 12 deg 12 deg * 15 deg  Ankle plantarflexion 40 deg 32 deg *   Ankle inversion 29 deg 15 deg * 18 deg   Ankle eversion 15 deg 14 deg * 14 deg    (Blank rows = not tested) Comments:  no pain with PROM (Blank rows = not tested) (Key: WFL = within functional limits not formally assessed, * = concordant pain, s = stiffness/stretching sensation, NT = not tested)  Comments:    LOWER EXTREMITY MMT:  MMT Right eval Left eval Left 03/17/23   Left 04/06/2023  Knee flexion      Knee extension      Ankle dorsiflexion 5 4+ 4+   Ankle plantarflexion    3  Ankle inversion 5 4+ 5   Ankle eversion 5 4+ 5    (Blank rows = not tested) Comments:   (Blank rows = not tested) (Key: WFL = within functional limits not formally assessed, * = concordant pain, s = stiffness/stretching sensation, NT = not tested)  Comments:    LOWER EXTREMITY SPECIAL TESTS:  deferred  FUNCTIONAL TESTS:  5xSTS: 11.96sec without UE support, does increase heel pain  03/17/23: - 5xSTS 10.90 sec R knee pain, gentle UE support   GAIT: Distance walked: within clinic Assistive device utilized: Single point cane and None Level of assistance: Modified independence Comments: antalgic gait LLE, reduced gait speed/cadence   TODAY'S TREATMENT:       [facility] Adult PT Treatment:                                                DATE: 04/06/2023 NuStep L5 x 5 min with UE/LE to improve endurance and workload capacity Seated heel raise with 25# 3 x 10 Longsitting ankle PF with black 2 x 20 Longsitting ankle DF, inversion, eversion with red 2 x 20 Sit to stand x 10, holding 15# at chest 3 x 10 Standing heel raise 3 x 10 Tandem stance 3 x 30 sec each Standing hip abduction red at knees with single UE support at counter 3 x 10 each Forward 6" step-up with single UE support 2 x 10 each SLS 3 x 10 sec each Cold pack  applied  to left achilles region with patient in longsitting x 10 min   [facility] Adult PT Treatment:                                                DATE: 04/01/23 Therapeutic Exercise: Nustep L5 x 5 minutes  Heel raise 10 x 3  Tandem stance  x 60 sec each - added head turns for challenge Alternating march Alternating h/s curls  F.M squats  x 10 LAQ 6# 10 x 3  STS 3 x 10 from chair  Side hip abduction 10 x 2 each  SLR 10 x 2 each Modalities: Ice pack 10 min ankle longsitting  [facility] Adult PT Treatment:                                                DATE: 03/30/23 Therapeutic Exercise: NuStep L5 x 5 min with UE/LE while taking subjective SLR 2 x 10 each Bridge 2 x 10 Hooklying clamshell with blue 2 x 20 Sidelying hip abduction 2 x 10 each LAQ with 5# 2 x 10 Sit to stand 3 x 10 Seated heel toe raises x 20 Standing heel raises 3 x 10 Tandem stance 3 x 30 sec each Modalities: Cold pack L heel/ankle in longsitting x 10 min   PATIENT EDUCATION:  Education details: HEP Person educated: Patient Education method: Programmer, multimedia, Demonstration, Actor cues, Verbal cues Education comprehension: verbalized understanding, returned demonstration, verbal cues required, tactile cues required, and needs further education     HOME EXERCISE PROGRAM: Access Code: [email or web address]   ASSESSMENT: CLINICAL IMPRESSION: Patient tolerated therapy well with no adverse effects. Therapy focused on progressing strengthening for the left ankle, and also progressing her LE strength, endurance, balance, and functional movements with good tolerance. She did well with her balance training this visit with only occasional use of UE for balance correction, but she does exhibit greater difficulty on the left. She tolerated increase in resistance for ankle and LE strengthening this visit, but does continue to exhibit weakness of the left calf. She does report "cramping" sensation with exercise that does resolve relatively quickly  with rest. No changes made to her HEP this visit. Patient would benefit from continued skilled PT to progress her ankle strength and activity tolerance to reduce pain and maximize functional ability.    EVAL- Patient is a pleasant 63 y.o. woman who was seen today for physical therapy evaluation and treatment for L achilles tendon issues. She endorses difficulty with WB tasks, particularly stair navigation. On exam she demonstrates painful limitations in L ankle mobility and strength, hypersensitivity to light touch at posterior/lateral heel. Symptoms are irritable throughout although she denies any increase in resting pain. Does well with HEP, no increase in symptoms. No adverse events. Recommend trial of skilled PT to address aforementioned deficits with aim of improving functional tolerance and reducing pain with typical activities. Pt departs today's session in no acute distress, all voiced concerns/questions addressed appropriately from PT perspective.      OBJECTIVE IMPAIRMENTS: Abnormal gait, decreased activity tolerance, decreased endurance, decreased mobility, difficulty walking, decreased ROM, decreased strength, improper body mechanics, and pain.   ACTIVITY LIMITATIONS: standing, squatting, stairs, transfers, and locomotion level  PARTICIPATION LIMITATIONS: meal prep, cleaning,  laundry, driving, and community activity  PERSONAL FACTORS: Age, Time since onset of injury/illness/exacerbation, and 3+ comorbidities: depression/anxiety, dysphagia, Cdiff, HTN, migraines, myasthenia gravis, tracheostomy, OSA  are also affecting patient's functional outcome.    GOALS: Goals reviewed with patient? Yes  SHORT TERM GOALS: Target date: 02/10/2023  Pt will demonstrate appropriate understanding and performance of initially prescribed HEP in order to facilitate improved independence with management of symptoms.  Baseline: HEP provided on eval 03/17/23: reports good HEP adherence Goal status: MET  2.  Pt will report ability to ambulate at least 30 min with less than 3 pt increase in pain on NPS in order to facilitate improved community navigation.  Baseline: pain with 30 min per pt report 03/17/23: pt reports she has been walking in grocery stores for over an hour for exercise, does have increased pain in heel Goal status: ONGOING  LONG TERM GOALS: Target date: 04/21/2023 (updated 03/17/23)  Pt will score 66 or greater on FOTO in order to demonstrate improved perception of function due to symptoms.  Baseline: 63 03/17/23: 62 Goal status: ONGOING  2.  Pt will demonstrate grossly symmetrical ankle DF/inversion AROM in order to facilitate improved tolerance to functional movements. Baseline: see ROM chart above 03/17/23: see ROM chart above Goal status: ONGOING  3.  Pt will demonstrate appropriate performance of final prescribed HEP in order to facilitate improved self-management of symptoms post-discharge.   Baseline: initial HEP prescribed 03/17/23: HEP going well per pt Goal status: ONGOING  4.  Pt will be able to perform 5xSTS in less than or equal to 9sec in order to demonstrate reduced fall risk and improved functional independence (MCID 5xSTS = 2.3 sec). Baseline: 11.96sec with increase in pain 03/17/23: 10sec Goal status: ONGOING   PLAN:  PT FREQUENCY: 2x/week   PT DURATION: 5 weeks  PLANNED INTERVENTIONS: 97164- PT Re-evaluation, 97110-Therapeutic exercises, 97530- Therapeutic activity, 97112- Neuromuscular re-education, 97535- Self Care, 16109- Manual therapy, [phone]- Gait training, Patient/Family education, Balance training, Stair training, Taping, Dry Needling, Joint mobilization, Cryotherapy, and Moist heat  PLAN FOR NEXT SESSION: continue along updated POC with emphasis on foot/ankle strength, improving mechanics with functional mobility. Monitor pt report of R knee pain.    Rosana Hoes, PT, DPT, LAT, ATC 04/06/23  9:39 AM Phone: [phone] Fax:  [phone]

## 2023-04-07 ENCOUNTER — Ambulatory Visit (INDEPENDENT_AMBULATORY_CARE_PROVIDER_SITE_OTHER): Payer: Medicare Other | Admitting: Family Medicine

## 2023-04-07 ENCOUNTER — Encounter: Payer: Self-pay | Admitting: Family Medicine

## 2023-04-07 VITALS — BP 129/86 | HR 82 | Ht 60.0 in | Wt 302.0 lb

## 2023-04-07 DIAGNOSIS — R053 Chronic cough: Secondary | ICD-10-CM | POA: Diagnosis not present

## 2023-04-07 DIAGNOSIS — Z113 Encounter for screening for infections with a predominantly sexual mode of transmission: Secondary | ICD-10-CM | POA: Diagnosis not present

## 2023-04-07 DIAGNOSIS — Z114 Encounter for screening for human immunodeficiency virus [HIV]: Secondary | ICD-10-CM

## 2023-04-07 DIAGNOSIS — E8889 Other specified metabolic disorders: Secondary | ICD-10-CM

## 2023-04-07 DIAGNOSIS — R21 Rash and other nonspecific skin eruption: Secondary | ICD-10-CM

## 2023-04-07 DIAGNOSIS — R7303 Prediabetes: Secondary | ICD-10-CM

## 2023-04-07 DIAGNOSIS — Z6841 Body Mass Index (BMI) 40.0 and over, adult: Secondary | ICD-10-CM

## 2023-04-07 MED ORDER — BETAMETHASONE DIPROPIONATE 0.05 % EX CREA: TOPICAL_CREAM | Freq: Two times a day (BID) | CUTANEOUS | 0 refills | Status: DC

## 2023-04-07 NOTE — Progress Notes (Addendum)
    SUBJECTIVE:   CHIEF COMPLAINT / HPI:   Hives Attributes hives to starting during Thanksgiving 2024 after having COVID. Says it is located mainly on legs, around waist and arms. Constant itchiness and burning all day. Has been using green alcohol to rub on her skin which causes some relief. No recent change in diet. Husband thinks she's stressed. Mother in law died in 10/17/2023 previously caretaker for her. Started caring for her sister since then who has cancer and dementia. Has two sisters with cancer.   Cough Persistent dry cough since November 2024. Denies sick symptoms.Uses CPAP nightly.  Recent CT scan did not indicate COPD process. Does get SOB as well with exertion (climbing upstairs), but this is chronic and previously known.   PERTINENT  PMH / PSH: myasthenias gravis  OBJECTIVE:   BP 129/86   Pulse 82   Ht 5' (1.524 m)   Wt (!) 302 lb (137 kg)   SpO2 98%   BMI 58.98 kg/m   Gen: well-appearing, pleasant and cooperative on exam Skin: diffuse patches of wheal-like consistency on legs, arms, abdomen, and lower back CV: RRR no m/r/g Pulm: CTAB, no wheezes, crackles or rhonchi  ASSESSMENT/PLAN:   No problem-specific Assessment & Plan notes found for this encounter.  Frances Medina is a 63 year old with PMHx of myasthenias gravis who is presenting with three month history of hives and dry cough.   Hives Ddx of chronic hive presentation includes stress, autoimmune etiology, and allergic contact dermatitis. Leading differential at this time is stress-reaction, given the patient's recent stressors including a death in the family and caregiving for several family members with cancer. Would want to also consider autoimmune etiology given that she has a past medical history of myasthenias gravis. Less likely to be allergic contact dermatitis because she does not recount any recent exposures and the hive presentation is diffuse vs focal/concentrated in one region as would be expected  for contact dermatitis. Plan: F/u ANA, RF, TSH, HIV, RPR labwork. Trial topical betamethasone  cream to calm down inflammatory symptoms. Patient declined allergist referral at this time due to previously receiving 65 shots and results indicating she did not have allergies. F/u in 1 month.   Cough  - chronic bronchitis vs upper airway syndrome vs CPAP dryness Benign cardiopulm exam. Wonder if this is also part of her MG syndrome. Recent CT lungs is negative for any concerning cardiopulmonary pathology No indication for A/B treatment at this time Continue Wixela as prescribed and Albuterol  prn ECHO reviewed - wonder if we can repeat this soon Schedule pulmonology f/u soon. F/U soon if there is no improvement  Frances Medina, Medical Student [facility]

## 2023-04-07 NOTE — Patient Instructions (Signed)
It was nice seeing you today. I am sorry about your skin lesion. We will get some lab to assess the cause of your rash. I will also refer you to an allergist to do some testing.  I sent you some skin for the rash. I will call you soon with your result.

## 2023-04-08 ENCOUNTER — Encounter: Payer: Self-pay | Admitting: Family Medicine

## 2023-04-08 ENCOUNTER — Ambulatory Visit: Payer: Medicare Other

## 2023-04-09 ENCOUNTER — Encounter: Payer: Self-pay | Admitting: Family Medicine

## 2023-04-09 LAB — ANA

## 2023-04-09 LAB — RPR: RPR Ser Ql: NONREACTIVE

## 2023-04-09 LAB — RHEUMATOID FACTOR: Rheumatoid fact SerPl-aCnc: <10 [IU]/mL (ref <14.0)

## 2023-04-09 LAB — TSH: TSH: 2.37 u[IU]/mL (ref 0.450–4.500)

## 2023-04-09 LAB — HIV ANTIBODY (ROUTINE TESTING W REFLEX): HIV Screen 4th Generation wRfx: NONREACTIVE

## 2023-04-13 ENCOUNTER — Telehealth: Payer: Self-pay | Admitting: Family Medicine

## 2023-04-13 DIAGNOSIS — L509 Urticaria, unspecified: Secondary | ICD-10-CM

## 2023-04-13 MED ORDER — PREDNISONE 20 MG PO TABS: 20.0000 mg | ORAL_TABLET | Freq: Every day | ORAL | 0 refills | Status: AC

## 2023-04-13 NOTE — Telephone Encounter (Signed)
 She is worried about her skin rash spreading. She is compliant with her Pepcid , Zyrtec , and Betamethasone  cream, but there has been no improvement. The rash worsens whenever she is stressed, anxious, or receives bad news.  I wonder if this is a stress rash, a form of Hives trigger? We will continue the current regimen and trial oral steroids for 5 days. She is now interested in seeing an allergist again. Referral placed.  F/U in our skin clinic for possible skin biopsy.

## 2023-04-15 ENCOUNTER — Ambulatory Visit (INDEPENDENT_AMBULATORY_CARE_PROVIDER_SITE_OTHER): Payer: 59 | Admitting: Family Medicine

## 2023-04-15 ENCOUNTER — Ambulatory Visit: Payer: Medicare Other

## 2023-04-15 VITALS — BP 136/75 | HR 73 | Temp 99.3°F | Wt 301.0 lb

## 2023-04-15 DIAGNOSIS — L501 Idiopathic urticaria: Secondary | ICD-10-CM | POA: Insufficient documentation

## 2023-04-15 DIAGNOSIS — L509 Urticaria, unspecified: Secondary | ICD-10-CM | POA: Diagnosis not present

## 2023-04-15 NOTE — Patient Instructions (Addendum)
It was great to see you! Thank you for allowing me to participate in your care!  Our plans for today:  - I will let you know the results of your biopsy. - Please return earlier if you have any redness or swelling. - The incision should heal in the next 1-2 weeks. - Please finish taking your steroids for the next 3 days.  Please arrive 15 minutes PRIOR to your next scheduled appointment time! If you do not, this affects OTHER patients' care.  Take care and seek immediate care sooner if you develop any concerns.   Celine Mans, MD, PGY-2 [facility] 11:37 AM 04/15/2023  [facility]

## 2023-04-15 NOTE — Assessment & Plan Note (Signed)
Differential of urticaria includes but is not limited to viral infection, vaccines, allergic reaction, medication induced, stress/physical reaction. No clear initiating factor in the patient. Possibly due to recent stressor in the family. Punch biopsy as below. Follow-up 2 weeks. Discussed finishing prednisone course. Continue supportive care.

## 2023-04-15 NOTE — Progress Notes (Signed)
    SUBJECTIVE:   CHIEF COMPLAINT / HPI: rash follow-up  Rash Itchy diffuse body erythematous papules. Ongoing for couple weeks. No new detergents, clothes, soaps. Has tried betamethasone cream. Recently start course of prednisone 20mg . First pill took today. Believes that it related to stress.  Rash and pruritus worse with stress. Would like to proceed with biopsy.  PERTINENT  PMH / PSH: RLD, OSA< GERD, Myasthenia Gravis, OA  OBJECTIVE:   BP 136/75   Pulse 73   Temp 99.3 F (37.4 C)   Wt (!) 301 lb (136.5 kg)   SpO2 96%   BMI 58.79 kg/m   General: NAD, well appearing Neuro: A&O Respiratory: normal WOB on RA Extremities: Moving all 4 extremities equally Skin: erythematous scattered wheals, with out clear borders, across bilateral upper extremities    ASSESSMENT/PLAN:   Assessment & Plan Hives Differential of urticaria includes but is not limited to viral infection, vaccines, allergic reaction, medication induced, stress/physical reaction. No clear initiating factor in the patient. Possibly due to recent stressor in the family. Punch biopsy as below. Follow-up 2 weeks. Discussed finishing prednisone course. Continue supportive care. Pre-op Diagnosis: Hives Post-op Diagnosis: Same Procedure: Punch Skin Biopsy Location: Left arm Performing Physician: Celine Mans, MD Supervising Physician (if applicable): Burley Saver, MD  Informed consent was obtained prior to the procedure. Time out performed. The area surrounding the skin lesion was prepared and cleaned with povidone-iodine swab stick and wiped clean with alcohol prep. The area was sufficiently anesthetized with 1% Lidocaine with epinephrine.  A size 3mm disposable biopsy punch tool was used to obtain tissue sample and placed in labeled biopsy cup. Gauze and pressure was used to obtain hemostasis. The site  closed with one steri strip . The patient tolerated the procedure well without complications.  Standard  post-procedure care was explained and return precautions and wound care handout given.   Return in about 2 weeks (around 04/29/2023) for rash f/u.  Celine Mans, MD [facility]

## 2023-04-17 ENCOUNTER — Other Ambulatory Visit: Payer: Self-pay

## 2023-04-17 ENCOUNTER — Ambulatory Visit: Payer: Medicare Other | Admitting: Physical Therapy

## 2023-04-17 ENCOUNTER — Encounter: Payer: Self-pay | Admitting: Physical Therapy

## 2023-04-17 DIAGNOSIS — M25572 Pain in left ankle and joints of left foot: Secondary | ICD-10-CM | POA: Diagnosis not present

## 2023-04-17 DIAGNOSIS — R2689 Other abnormalities of gait and mobility: Secondary | ICD-10-CM | POA: Diagnosis not present

## 2023-04-17 NOTE — Therapy (Addendum)
 OUTPATIENT PHYSICAL THERAPY TREATMENT NOTE   Patient Name: Frances Medina MRN: [id number] DOB:03/29/1960, 63 y.o., female Today's Date: 04/17/2023   END OF SESSION:  PT End of Session - 04/17/23 0752     Visit Number 12    Number of Visits 17    Date for PT Re-Evaluation 04/21/23    Authorization Type medicare AB + [employer] state health    Progress Note Due on Visit 16    PT Start Time 0800    PT Stop Time 0845    PT Time Calculation (min) 45 min    Activity Tolerance Patient tolerated treatment well    Behavior During Therapy [facility] for tasks assessed/performed            Past Medical History:  Diagnosis Date   Acute respiratory failure (HCC)    Acute respiratory failure with hypoxemia (HCC)    Aspiration into airway    Back pain 07/31/2017   Benign neoplasm of rectum    CAP (community acquired pneumonia) 08/05/2015   Depression with anxiety    Diverticulosis    Dysphagia 03/2015    EGD, Dr Leone Payor. mild antral gastritis, ? due to Ibuprofen.  no stricture but empirically maloney dilated esophagus.    Dysphagia, neurologic    E. 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   GERD (gastroesophageal reflux disease)    hiatal hernia   Hypertension    "went away when I stopped smoking"   Knee injury    Left hip pain 02/18/2017   Migraine    "maybe couple times/month" (03/20/2015)   Mild intermittent asthma 06/22/2017   Myasthenia gravis (HCC) 2017   Myasthenia gravis with acute exacerbation (HCC) 05/15/2015   Osteoarthritis of left knee 12/02/2013   Osteoarthritis of right knee 08/30/2013   Pancreatitis 07/25/2017   Protein-calorie malnutrition, severe (HCC) 08/07/2015   Seasonal allergies    takes Zytrec    Tracheostomy status (HCC)    Tubular adenoma of colon    Tumors    "in my stomach"   Umbilical hernia    watching , no plans for surgery at present   Urine incontinence 10/19/2015   VAP (ventilator-associated pneumonia) [facility])    Past Surgical History:  Procedure Laterality Date   BIOPSY  07/05/2019   Procedure: BIOPSY;  Surgeon: Meryl Dare, MD;  Location: [facility] ENDOSCOPY;  Service: Endoscopy;;   CESAREAN SECTION  1987; 1989   COLONOSCOPY WITH PROPOFOL N/A 09/19/2013   Procedure: COLONOSCOPY WITH PROPOFOL;  Surgeon: Meryl Dare, MD;  Location: [facility] ENDOSCOPY;  Service: Endoscopy;  Laterality: N/A;   COLONOSCOPY WITH PROPOFOL N/A 07/05/2019   Procedure: COLONOSCOPY WITH PROPOFOL;  Surgeon: Meryl Dare, MD;  Location: [facility] ENDOSCOPY;  Service: Endoscopy;  Laterality: N/A;   DILATION AND CURETTAGE OF UTERUS     ESOPHAGOGASTRODUODENOSCOPY N/A 08/10/2015   Procedure: ESOPHAGOGASTRODUODENOSCOPY (EGD);  Surgeon: Iva Boop, MD;  Location: [facility] ENDOSCOPY;  Service: Endoscopy;  Laterality: N/A;   ESOPHAGOGASTRODUODENOSCOPY (EGD) WITH PROPOFOL N/A 03/21/2015   Procedure: ESOPHAGOGASTRODUODENOSCOPY (EGD) WITH PROPOFOL;  Surgeon: Iva Boop, MD;  Location: [facility] ENDOSCOPY;  Service: Endoscopy;  Laterality: N/A;   ESOPHAGOGASTRODUODENOSCOPY (EGD) WITH PROPOFOL N/A 07/05/2019   Procedure: ESOPHAGOGASTRODUODENOSCOPY (  EGD) WITH PROPOFOL;  Surgeon: Meryl Dare, MD;  Location: [facility] ENDOSCOPY;  Service: Endoscopy;  Laterality: N/A;   PARTIAL KNEE ARTHROPLASTY Right 08/30/2013   Procedure: RIGHT UNICOMPARTMENTAL KNEE;  Surgeon: Eulas Post, MD;  Location: [facility] OR;  Service: Orthopedics;  Laterality: Right;   PARTIAL KNEE ARTHROPLASTY Left 12/02/2013   Procedure: LEFT KNEE UNI ARTHROPLASTY;  Surgeon: Eulas Post, MD;  Location: [facility];  Service: Orthopedics;  Laterality: Left;   PEG PLACEMENT N/A 08/10/2015   Procedure: PERCUTANEOUS ENDOSCOPIC GASTROSTOMY (PEG) PLACEMENT;  Surgeon:  Iva Boop, MD;  Location: [facility] ENDOSCOPY;  Service: Endoscopy;  Laterality: N/A;   POLYPECTOMY  07/05/2019   Procedure: POLYPECTOMY;  Surgeon: Meryl Dare, MD;  Location: [facility] ENDOSCOPY;  Service: Endoscopy;;   TUBAL LIGATION  1989   VAGINAL HYSTERECTOMY  1990's?   "apparently took out one of my ovaries at the time too cause one's missing"   Patient Active Problem List   Diagnosis Date Noted   Hives 04/15/2023   Knee pain 03/13/2023   Dizziness 03/13/2023   BMI 50.0-59.9, adult (HCC) 01/26/2023   Lumbar radiculopathy 12/03/2021   Positive ANA (antinuclear antibody) 08/26/2019   History of colonic polyps    Macromastia 06/17/2019   Seasonal allergies 02/18/2019   GAD (generalized anxiety disorder) 08/06/2018   Sinusitis 02/25/2018   Iron deficiency anemia 07/31/2017   Pre-diabetes 07/31/2017   Osteoarthritis of left hip 02/18/2017   OSA on CPAP 09/11/2016   Restrictive lung disease 10/11/2015   Major depression    Headache, migraine    Shortness of breath    Splenomegaly 04/07/2015   Myasthenia gravis (HCC)    Osteoarthritis of left knee 12/02/2013   Benign neoplasm of descending colon 09/19/2013   Osteoarthritis of right knee 08/30/2013   GERD (gastroesophageal reflux disease) 06/23/2013   Fibroids 06/23/2013   PCP: Doreene Eland, MD  REFERRING PROVIDER: Louann Sjogren, DPM  REFERRING DIAG: [phone] (ICD-10-CM) - Tendonitis, Achilles, left  THERAPY DIAG:  Pain in left ankle and joints of left foot  Other abnormalities of gait and mobility  Rationale for Evaluation and Treatment: Rehabilitation  ONSET DATE: a couple months per pt report   SUBJECTIVE:  SUBJECTIVE STATEMENT: Patient reports that she has been dealing with hives and has not been focused on her ankle as much. However, she says that it feels really good. She has not had any pain since last session.   EVAL- Cannot recall any precipitating factors, pt states it seems like it is slowly worsening.  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  Does report some L LE numbness and back pain when standing, reports this has been going on since knee surgeries. Tries to walk 3x/week with her sister for past couple weeks. Also reports swelling in her limb since her knee surgery, does not endorse any recent changes with this.   PERTINENT HISTORY: depression/anxiety, dysphagia, Cdiff, HTN, migraines, myasthenia gravis (in remission per pt), OSA   PAIN:  Are you having pain: Yes 7/10 in heel, especially with stretching Location/description: L achilles, lateral heel; burning,cramping at times - aggravating factors: stair navigation, walking ~30 min at a time, driving, footwear - Easing factors: propping up foot, min relief from stretching, footwear  PRECAUTIONS: None  WEIGHT BEARING RESTRICTIONS: No  FALLS:  Has patient fallen in last 6 months? No - reports near falls  due to pain   LIVING ENVIRONMENT: Lives w/ husband and two children 2 story house; 14 steps to second floor with rail, 4STE Pt does majority of housework Tub shower, no rails, no seat Has a walker, doesn't use it  OCCUPATION: not working, on disability since 2016-2017 per pt report  PLOF: Independent - with cane use since knee surgeries  PATIENT GOALS: wants to heal her ankle   OBJECTIVE:  Note: Objective measures were completed at Evaluation unless otherwise noted. PATIENT SURVEYS:  FOTO 63 current, 66 predicted 03/17/23: 62   SENSATION: Light touch intact BIL LE, increased sensitivity to light touch posterior heel and lateral heel L  EDEMA:  Gross swelling noted about L ankle around joint line, no apparent erythema or bruising   PALPATION: Concordant tenderness posterior heel, lateral heel on L. No other muscular or bony tenderness foot or calf   LOWER EXTREMITY ROM:     Active ROM Right eval  Left eval Left 03/17/23   Knee flexion     Knee extension     Ankle dorsiflexion 12 deg 12 deg * 15 deg  Ankle plantarflexion 40 deg 32 deg *   Ankle inversion 29 deg 15 deg * 18 deg   Ankle eversion 15 deg 14 deg * 14 deg    (Blank rows = not tested) Comments:  no pain with PROM (Blank rows = not tested) (Key: WFL = within functional limits not formally assessed, * = concordant pain, s = stiffness/stretching sensation, NT = not tested)  Comments:    LOWER EXTREMITY MMT:  MMT Right eval Left eval Left 03/17/23   Left 04/06/2023  Knee flexion      Knee extension      Ankle dorsiflexion 5 4+ 4+   Ankle plantarflexion    3  Ankle inversion 5 4+ 5   Ankle eversion 5 4+ 5    (Blank rows = not tested) Comments:   (Blank rows = not tested) (Key: WFL = within functional limits not formally assessed, * = concordant pain, s = stiffness/stretching sensation, NT = not tested)  Comments:    LOWER EXTREMITY SPECIAL TESTS:  deferred  FUNCTIONAL TESTS:  5xSTS: 11.96sec without UE support, does increase heel pain  03/17/23: - 5xSTS 10.90 sec R knee pain, gentle UE support   GAIT: Distance walked: within clinic Assistive device utilized: Single point cane and None Level of assistance: Modified independence Comments: antalgic gait LLE, reduced gait speed/cadence   TODAY'S TREATMENT:        [facility] Adult PT Treatment:     DATE: 04/16/2023 NuStep L5 x 5 min with UE/LE to improve endurance and workload capacity  Longsitting ankle PF, DF, inversion, and eversion with black 2x10  Sit to stand x10, holding #10 at chest 3x10  Sidelying clam shells GTB 2x10  Standing heel raises 2x15  Standing toe raises 2x15  Tandem on airex 3x30" BL  SLS x5 for time (best L: 12, R: 20)  Forward 6" step-up with no UE 1x15 BL  Tandem trampoline toss 1x10 BL   [facility] Adult PT Treatment:                                                DATE: 04/06/2023 NuStep L5 x 5 min with UE/LE to improve endurance and workload  capacity Seated heel raise with 25# 3 x  10 Longsitting ankle PF with black 2 x 20 Longsitting ankle DF, inversion, eversion with red 2 x 20 Sit to stand x 10, holding 15# at chest 3 x 10 Standing heel raise 3 x 10 Tandem stance 3 x 30 sec each Standing hip abduction red at knees with single UE support at counter 3 x 10 each Forward 6" step-up with single UE support 2 x 10 each SLS 3 x 10 sec each Cold pack applied to left achilles region with patient in longsitting x 10 min  [facility] Adult PT Treatment:                                                DATE: 04/01/23 Therapeutic Exercise: Nustep L5 x 5 minutes  Heel raise 10 x 3  Tandem stance  x 60 sec each - added head turns for challenge Alternating march Alternating h/s curls  F.M squats  x 10 LAQ 6# 10 x 3  STS 3 x 10 from chair  Side hip abduction 10 x 2 each  SLR 10 x 2 each Modalities: Ice pack 10 min ankle longsitting  PATIENT EDUCATION:  Education details: HEP Person educated: Patient Education method: Explanation, Demonstration, Actor cues, Verbal cues Education comprehension: verbalized understanding, returned demonstration, verbal cues required, tactile cues required, and needs further education     HOME EXERCISE PROGRAM: Access Code: [email or web address]   ASSESSMENT: CLINICAL IMPRESSION: Patient tolerated therapy well with no adverse effects. She shows great progress even though she has not been to PT in 10 days. Therapy focused on progressing strengthening for the left ankle, and also progressing her LE strength, endurance, balance, and functional movements with good tolerance. Patient continues to progress in her balance, but still has deficits in L being more challenging than R. She still experiencing cramping in her L calf. She continues to have fatigue during exercise. No changes made to HEP this visit. Next session will be a re-eval and goal check. Patient would benefit from continued skill PT to progress ankle strength and  activity tolerance to reduce pain and maximize functional ability.   EVAL- Patient is a pleasant 63 y.o. woman who was seen today for physical therapy evaluation and treatment for L achilles tendon issues. She endorses difficulty with WB tasks, particularly stair navigation. On exam she demonstrates painful limitations in L ankle mobility and strength, hypersensitivity to light touch at posterior/lateral heel. Symptoms are irritable throughout although she denies any increase in resting pain. Does well with HEP, no increase in symptoms. No adverse events. Recommend trial of skilled PT to address aforementioned deficits with aim of improving functional tolerance and reducing pain with typical activities. Pt departs today's session in no acute distress, all voiced concerns/questions addressed appropriately from PT perspective.      OBJECTIVE IMPAIRMENTS: Abnormal gait, decreased activity tolerance, decreased endurance, decreased mobility, difficulty walking, decreased ROM, decreased strength, improper body mechanics, and pain.   ACTIVITY LIMITATIONS: standing, squatting, stairs, transfers, and locomotion level  PARTICIPATION LIMITATIONS: meal prep, cleaning, laundry, driving, and community activity  PERSONAL FACTORS: Age, Time since onset of injury/illness/exacerbation, and 3+ comorbidities: depression/anxiety, dysphagia, Cdiff, HTN, migraines, myasthenia gravis, tracheostomy, OSA  are also affecting patient's functional outcome.    GOALS: Goals reviewed with patient? Yes  SHORT TERM GOALS: Target date: 02/10/2023  Pt will demonstrate appropriate understanding and performance of initially prescribed HEP  in order to facilitate improved independence with management of symptoms.  Baseline: HEP provided on eval 03/17/23: reports good HEP adherence Goal status: MET  2. Pt will report ability to ambulate at least 30 min with less than 3 pt increase in pain on NPS in order to facilitate improved  community navigation.  Baseline: pain with 30 min per pt report 03/17/23: pt reports she has been walking in grocery stores for over an hour for exercise, does have increased pain in heel Goal status: ONGOING  LONG TERM GOALS: Target date: 04/21/2023 (updated 03/17/23)  Pt will score 66 or greater on FOTO in order to demonstrate improved perception of function due to symptoms.  Baseline: 63 03/17/23: 62 Goal status: ONGOING  2.  Pt will demonstrate grossly symmetrical ankle DF/inversion AROM in order to facilitate improved tolerance to functional movements. Baseline: see ROM chart above 03/17/23: see ROM chart above Goal status: ONGOING  3.  Pt will demonstrate appropriate performance of final prescribed HEP in order to facilitate improved self-management of symptoms post-discharge.   Baseline: initial HEP prescribed 03/17/23: HEP going well per pt Goal status: ONGOING  4.  Pt will be able to perform 5xSTS in less than or equal to 9sec in order to demonstrate reduced fall risk and improved functional independence (MCID 5xSTS = 2.3 sec). Baseline: 11.96sec with increase in pain 03/17/23: 10sec Goal status: ONGOING   PLAN:  PT FREQUENCY: 2x/week   PT DURATION: 5 weeks  PLANNED INTERVENTIONS: 97164- PT Re-evaluation, 97110-Therapeutic exercises, 97530- Therapeutic activity, 97112- Neuromuscular re-education, 97535- Self Care, 16109- Manual therapy, [phone]- Gait training, Patient/Family education, Balance training, Stair training, Taping, Dry Needling, Joint mobilization, Cryotherapy, and Moist heat  PLAN FOR NEXT SESSION: review/revise HEP, foot/ankle strength, improving mechanics with functional mobility. Elmer Bales, Student-PT 04/17/2023, 8:57 AM

## 2023-04-20 LAB — DERMATOLOGY PATHOLOGY

## 2023-04-21 ENCOUNTER — Telehealth: Payer: Self-pay | Admitting: Family Medicine

## 2023-04-21 ENCOUNTER — Other Ambulatory Visit: Payer: Self-pay

## 2023-04-21 ENCOUNTER — Ambulatory Visit: Payer: Medicare Other | Admitting: Physical Therapy

## 2023-04-21 ENCOUNTER — Encounter: Payer: Self-pay | Admitting: Physical Therapy

## 2023-04-21 DIAGNOSIS — M25572 Pain in left ankle and joints of left foot: Secondary | ICD-10-CM | POA: Diagnosis not present

## 2023-04-21 DIAGNOSIS — L509 Urticaria, unspecified: Secondary | ICD-10-CM

## 2023-04-21 DIAGNOSIS — R2689 Other abnormalities of gait and mobility: Secondary | ICD-10-CM

## 2023-04-21 NOTE — Patient Instructions (Signed)
 Access Code: [email or web address] URL: [email or web address] Date: 04/21/2023 Prepared by: Rosana Hoes  Exercises - Long Sitting Ankle Plantar Flexion with Resistance  - 1 x daily - 3 sets - 20 reps - Long Sitting Ankle Eversion with Resistance  - 1 x daily - 3 sets - 20 reps - Long Sitting Ankle Inversion at Table with Resistance  - 1 x daily - 3 sets - 20 reps - Standing Gastroc Stretch at Counter  - 1 x daily - 3 reps - 30 seconds hold - Heel Raises with Counter Support  - 1 x daily - 3 sets - 10 reps - Active Straight Leg Raise with Quad Set  - 1 x daily - 2 sets - 10 reps - Bridge  - 1 x daily - 2 sets - 10 reps - Hooklying Clamshell with Resistance  - 1 x daily - 2 sets - 20 reps - Sidelying Hip Abduction  - 1 x daily - 2 sets - 10 reps - Seated Long Arc Quad with Ankle Weight  - 1 x daily - 3 sets - 10 reps - Sit to Stand Without Arm Support  - 1 x daily - 3 sets - 10 reps

## 2023-04-21 NOTE — Telephone Encounter (Signed)
 Called patient to discuss Derm path biopsy results.  Discussed that findings are consistent with urticaria and hives.  Patient notes she has been using an alcohol-based solution to help reduce itchiness.  Recommended she stop this as this may be something she is reacting to and causing the hives.  This will also dry out her skin.  Counseled on using eczema cream such as Eucerin or CeraVe a to help reduce dryness of her skin.  Overall her symptoms have improved since last visit.  She has follow-up on 02/20 in our Derm clinic.

## 2023-04-21 NOTE — Therapy (Signed)
 OUTPATIENT PHYSICAL THERAPY TREATMENT NOTE   DISCHARGE   Patient Name: Frances Medina MRN: [id number] DOB:[birth year], 63 y.o., female Today's Date: 04/21/2023   END OF SESSION:  PT End of Session - 04/21/23 1624     Visit Number 13    Number of Visits 17    Date for PT Re-Evaluation 04/21/23    Authorization Type medicare AB + BC[employer] state health    Progress Note Due on Visit 16    PT Start Time 1335    PT Stop Time 1350    PT Time Calculation (min) 15 min    Activity Tolerance Patient tolerated treatment well    Behavior During Therapy [facility] for tasks assessed/performed             Past Medical History:  Diagnosis Date   Acute respiratory failure (HCC)    Acute respiratory failure with hypoxemia (HCC)    Aspiration into airway    Back pain 07/31/2017   Benign neoplasm of rectum    CAP (community acquired pneumonia) 08/05/2015   Depression with anxiety    Diverticulosis    Dysphagia 03/2015    EGD, Dr Leone Payor. mild antral gastritis, ? due to Ibuprofen.  no stricture but empirically maloney dilated esophagus.    Dysphagia, neurologic    E. 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   GERD (gastroesophageal reflux disease)    hiatal hernia   Hypertension    "went away when I stopped smoking"   Knee injury    Left hip pain 02/18/2017   Migraine    "maybe couple times/month" (03/20/2015)   Mild intermittent asthma 06/22/2017   Myasthenia gravis (HCC) 2017   Myasthenia gravis with acute exacerbation (HCC) 05/15/2015   Osteoarthritis of left knee 12/02/2013   Osteoarthritis of right knee 08/30/2013   Pancreatitis 07/25/2017   Protein-calorie malnutrition, severe (HCC) 08/07/2015   Seasonal allergies     takes Zytrec   Tracheostomy status (HCC)    Tubular adenoma of colon    Tumors    "in my stomach"   Umbilical hernia    watching , no plans for surgery at present   Urine incontinence 10/19/2015   VAP (ventilator-associated pneumonia) [facility])    Past Surgical History:  Procedure Laterality Date   BIOPSY  07/05/2019   Procedure: BIOPSY;  Surgeon: Meryl Dare, MD;  Location: [facility];  Service: Endoscopy;;   CESAREAN SECTION  1987; 1989   COLONOSCOPY WITH PROPOFOL N/A 09/19/2013   Procedure: COLONOSCOPY WITH PROPOFOL;  Surgeon: Meryl Dare, MD;  Location: [facility];  Service: Endoscopy;  Laterality: N/A;   COLONOSCOPY WITH PROPOFOL N/A 07/05/2019   Procedure: COLONOSCOPY WITH PROPOFOL;  Surgeon: Meryl Dare, MD;  Location: [facility];  Service: Endoscopy;  Laterality: N/A;   DILATION AND CURETTAGE OF UTERUS     ESOPHAGOGASTRODUODENOSCOPY N/A 08/10/2015   Procedure: ESOPHAGOGASTRODUODENOSCOPY (EGD);  Surgeon: Iva Boop, MD;  Location: [facility] ENDOSCOPY;  Service: Endoscopy;  Laterality: N/A;   ESOPHAGOGASTRODUODENOSCOPY (EGD) WITH PROPOFOL N/A 03/21/2015   Procedure: ESOPHAGOGASTRODUODENOSCOPY (EGD) WITH PROPOFOL;  Surgeon: Iva Boop, MD;  Location: [facility] ENDOSCOPY;  Service: Endoscopy;  Laterality: N/A;   ESOPHAGOGASTRODUODENOSCOPY (EGD) WITH PROPOFOL N/A 07/05/2019  Procedure: ESOPHAGOGASTRODUODENOSCOPY (EGD) WITH PROPOFOL;  Surgeon: Meryl Dare, MD;  Location: [facility];  Service: Endoscopy;  Laterality: N/A;   PARTIAL KNEE ARTHROPLASTY Right 08/30/2013   Procedure: RIGHT UNICOMPARTMENTAL KNEE;  Surgeon: Eulas Post, MD;  Location: [facility] OR;  Service: Orthopedics;  Laterality: Right;   PARTIAL KNEE ARTHROPLASTY Left 12/02/2013   Procedure: LEFT KNEE UNI ARTHROPLASTY;  Surgeon: Eulas Post, MD;  Location: [facility];  Service: Orthopedics;  Laterality: Left;   PEG PLACEMENT N/A 08/10/2015   Procedure: PERCUTANEOUS ENDOSCOPIC GASTROSTOMY (PEG)  PLACEMENT;  Surgeon: Iva Boop, MD;  Location: [facility] ENDOSCOPY;  Service: Endoscopy;  Laterality: N/A;   POLYPECTOMY  07/05/2019   Procedure: POLYPECTOMY;  Surgeon: Meryl Dare, MD;  Location: [facility];  Service: Endoscopy;;   TUBAL LIGATION  1989   VAGINAL HYSTERECTOMY  1990's?   "apparently took out one of my ovaries at the time too cause one's missing"   Patient Active Problem List   Diagnosis Date Noted   Hives 04/15/2023   Knee pain 03/13/2023   Dizziness 03/13/2023   BMI 50.0-59.9, adult (HCC) 01/26/2023   Lumbar radiculopathy 12/03/2021   Positive ANA (antinuclear antibody) 08/26/2019   History of colonic polyps    Macromastia 06/17/2019   Seasonal allergies 02/18/2019   GAD (generalized anxiety disorder) 08/06/2018   Sinusitis 02/25/2018   Iron deficiency anemia 07/31/2017   Pre-diabetes 07/31/2017   Osteoarthritis of left hip 02/18/2017   OSA on CPAP 09/11/2016   Restrictive lung disease 10/11/2015   Major depression    Headache, migraine    Shortness of breath    Splenomegaly 04/07/2015   Myasthenia gravis (HCC)    Osteoarthritis of left knee 12/02/2013   Benign neoplasm of descending colon 09/19/2013   Osteoarthritis of right knee 08/30/2013   GERD (gastroesophageal reflux disease) 06/23/2013   Fibroids 06/23/2013   PCP: Doreene Eland, MD  REFERRING PROVIDER: Louann Sjogren, DPM  REFERRING DIAG: [phone] (ICD-10-CM) - Tendonitis, Achilles, left  THERAPY DIAG:  Pain in left ankle and joints of left foot  Other abnormalities of gait and mobility  Rationale for Evaluation and Treatment: Rehabilitation  ONSET DATE: a couple months per pt report   SUBJECTIVE:  SUBJECTIVE STATEMENT: Patient reports she is not having any pain and is ready for discharge this visit.  EVAL- Cannot recall any precipitating factors, pt states it seems like it is slowly worsening. 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  Does report some L LE numbness and back pain when standing, reports this has been going on since knee surgeries. Tries to walk 3x/week with her sister for past couple weeks. Also reports swelling in her limb since her knee surgery, does not endorse any recent changes with this.   PERTINENT HISTORY: depression/anxiety, dysphagia, Cdiff, HTN, migraines, myasthenia gravis (in remission per pt), OSA  PAIN:  Are you having pain: No  PRECAUTIONS: None  WEIGHT BEARING RESTRICTIONS: No  FALLS:  Has patient fallen in last 6 months? No - reports near falls due to pain   LIVING ENVIRONMENT: Lives w/ husband and two children 2 story house; 14 steps to second floor with rail, 4STE Pt does majority of housework Tub shower, no rails, no seat Has a walker, doesn't use it  OCCUPATION: not working, on disability since 2016-2017 per pt report  PLOF: Independent - with cane use  since knee surgeries  PATIENT GOALS: wants to heal her ankle   OBJECTIVE:  Note: Objective measures were completed at Evaluation unless otherwise noted. PATIENT SURVEYS:  FOTO 63 current, 66 predicted 03/17/23: 62  04/21/2023: 71%  SENSATION: Light touch intact BIL LE, increased sensitivity to light touch posterior heel and lateral heel L  EDEMA:  Gross swelling noted about L ankle around joint line, no apparent erythema or bruising  PALPATION: Concordant tenderness posterior heel, lateral heel on L. No other muscular or bony tenderness foot or calf   LOWER EXTREMITY ROM:     Active ROM Right eval Left eval Left 03/17/23  Left 04/21/2023  Knee flexion      Knee extension      Ankle dorsiflexion 12 deg 12 deg * 15 deg 15  Ankle plantarflexion 40 deg 32 deg *  50  Ankle inversion 29 deg 15 deg * 18 deg  40  Ankle eversion 15 deg 14 deg * 14 deg  20   (Blank rows = not tested) Comments:  no pain with PROM (Blank rows = not tested) (Key: WFL  = within functional limits not formally assessed, * = concordant pain, s = stiffness/stretching sensation, NT = not tested)  Comments:    LOWER EXTREMITY MMT:  MMT Right eval Left eval Left 03/17/23   Left 04/06/2023 Left 04/20/2022  Knee flexion       Knee extension       Ankle dorsiflexion 5 4+ 4+  5  Ankle plantarflexion    3 4  Ankle inversion 5 4+ 5  5  Ankle eversion 5 4+ 5  5   (Blank rows = not tested) Comments:   (Blank rows = not tested) (Key: WFL = within functional limits not formally assessed, * = concordant pain, s = stiffness/stretching sensation, NT = not tested)  Comments:    LOWER EXTREMITY SPECIAL TESTS:  deferred  FUNCTIONAL TESTS:  5xSTS: 11.96sec without UE support, does increase heel pain  03/17/23: - 5xSTS 10.90 sec R knee pain, gentle UE support   04/21/2023: 9 seconds  GAIT: Distance walked: within clinic Assistive device utilized: Single point cane and None Level of assistance: Modified independence Comments: antalgic gait LLE, reduced gait speed/cadence   TODAY'S TREATMENT:        [facility] Adult PT Treatment:     DATE: 04/21/2023 Reassessed objective measures noted above, ROM, MMT, FOTO, 5xSTS Reviewed HEP with patient and methods for regression/progression as needed Instructed patient on following-up with her provider as needed and how to return to therapy with pain returns or worsens  PATIENT EDUCATION:  Education details: POC discharge, HEP review Person educated: Patient Education method: Explanation Education comprehension: verbalized understanding  HOME EXERCISE PROGRAM: Access Code: [email or web address]   ASSESSMENT: CLINICAL IMPRESSION: Patient was checked in late for therapy and elected to only be seen for her discharge today. She is pleased with her current functional level and denies any heel/ankle pain with activity. She has made great progress with her left ankle mobility and strength, and reports improvement in her functional status. He FOTO  score exceeded the expected outcome and she did demonstrate improvement in her time for 5xSTS indicating improved LE strength and mobility. She has achieved all her established goals so will be formally discharged at this time.  EVAL- Patient is a pleasant 63 y.o. woman who was seen today for physical therapy evaluation and treatment for L achilles tendon issues. She endorses difficulty with WB tasks, particularly stair navigation. On  exam she demonstrates painful limitations in L ankle mobility and strength, hypersensitivity to light touch at posterior/lateral heel. Symptoms are irritable throughout although she denies any increase in resting pain. Does well with HEP, no increase in symptoms. No adverse events. Recommend trial of skilled PT to address aforementioned deficits with aim of improving functional tolerance and reducing pain with typical activities. Pt departs today's session in no acute distress, all voiced concerns/questions addressed appropriately from PT perspective.      OBJECTIVE IMPAIRMENTS: Abnormal gait, decreased activity tolerance, decreased endurance, decreased mobility, difficulty walking, decreased ROM, decreased strength, improper body mechanics, and pain.   ACTIVITY LIMITATIONS: standing, squatting, stairs, transfers, and locomotion level  PARTICIPATION LIMITATIONS: meal prep, cleaning, laundry, driving, and community activity  PERSONAL FACTORS: Age, Time since onset of injury/illness/exacerbation, and 3+ comorbidities: depression/anxiety, dysphagia, Cdiff, HTN, migraines, myasthenia gravis, tracheostomy, OSA  are also affecting patient's functional outcome.    GOALS: Goals reviewed with patient? Yes  SHORT TERM GOALS: Target date: 02/10/2023  Pt will demonstrate appropriate understanding and performance of initially prescribed HEP in order to facilitate improved independence with management of symptoms.  Baseline: HEP provided on eval 03/17/23: reports good HEP  adherence Goal status: MET  2. Pt will report ability to ambulate at least 30 min with less than 3 pt increase in pain on NPS in order to facilitate improved community navigation.  Baseline: pain with 30 min per pt report 03/17/23: pt reports she has been walking in grocery stores for over an hour for exercise, does have increased pain in heel 04/21/2023: denies pain or limitation Goal status: MET  LONG TERM GOALS: Target date: 04/21/2023 (updated 03/17/23)  Pt will score 66 or greater on FOTO in order to demonstrate improved perception of function due to symptoms.  Baseline: 63 03/17/23: 62 04/21/2023: 71% Goal status: MET  2.  Pt will demonstrate grossly symmetrical ankle DF/inversion AROM in order to facilitate improved tolerance to functional movements. Baseline: see ROM chart above 03/17/23: see ROM chart above 04/21/2023: see above Goal status: MET  3.  Pt will demonstrate appropriate performance of final prescribed HEP in order to facilitate improved self-management of symptoms post-discharge.   Baseline: initial HEP prescribed 03/17/23: HEP going well per pt 04/21/2023: independent Goal status: MET  4.  Pt will be able to perform 5xSTS in less than or equal to 9sec in order to demonstrate reduced fall risk and improved functional independence (MCID 5xSTS = 2.3 sec). Baseline: 11.96sec with increase in pain 03/17/23: 10sec 04/21/2023: 9 sec Goal status: MET   PLAN:  PT FREQUENCY: 2x/week   PT DURATION: 5 weeks  PLANNED INTERVENTIONS: 97164- PT Re-evaluation, 97110-Therapeutic exercises, 97530- Therapeutic activity, 97112- Neuromuscular re-education, 97535- Self Care, 11914- Manual therapy, [phone]- Gait training, Patient/Family education, Balance training, Stair training, Taping, Dry Needling, Joint mobilization, Cryotherapy, and Moist heat  PLAN FOR NEXT SESSION: NA - discharge   Rosana Hoes, PT, DPT, LAT, ATC 04/21/23  4:39 PM Phone: [phone] Fax:  [phone]    PHYSICAL THERAPY DISCHARGE SUMMARY  Visits from Start of Care: 13  Current functional level related to goals / functional outcomes: See above   Remaining deficits: See above   Education / Equipment: HEP   Patient agrees to discharge. Patient goals were met. Patient is being discharged due to meeting the stated rehab goals.

## 2023-04-22 ENCOUNTER — Encounter: Payer: Self-pay | Admitting: Licensed Clinical Social Worker

## 2023-04-23 ENCOUNTER — Ambulatory Visit: Payer: Medicare Other

## 2023-04-27 ENCOUNTER — Encounter: Payer: Self-pay | Admitting: Physician Assistant

## 2023-04-28 ENCOUNTER — Other Ambulatory Visit: Payer: Self-pay | Admitting: Family Medicine

## 2023-04-28 ENCOUNTER — Encounter: Payer: Self-pay | Admitting: Family Medicine

## 2023-04-28 ENCOUNTER — Telehealth: Payer: Self-pay

## 2023-04-28 MED ORDER — FEXOFENADINE HCL 180 MG PO TABS: 180.0000 mg | ORAL_TABLET | Freq: Every day | ORAL | 1 refills | Status: DC

## 2023-04-28 MED ORDER — BETAMETHASONE DIPROPIONATE 0.05 % EX CREA: TOPICAL_CREAM | Freq: Two times a day (BID) | CUTANEOUS | 0 refills | Status: DC

## 2023-04-28 NOTE — Telephone Encounter (Signed)
 Called to inform the patient the inform that was provided to me.  She stated that she already have an appointment with Allergy and asthma but its not until April. Patient stated that she does not want to schedule an appt to be seen in our clinic right now or be seen by anybody else. Patient was very frustrated.

## 2023-04-28 NOTE — Telephone Encounter (Signed)
 Pt calling to get results from skin biopsy. Pt is still having itching and burning. Please give her a call @ [phone].  Thank you! Frances Medina

## 2023-04-28 NOTE — Telephone Encounter (Signed)
 Called to check on her. Rash and itching persist. She is through with oral steroids with some improvement, but symptoms worsened after completion.  Med review was done today: Phentermine will cause urticaria - she is not using this meds Zoloft can cause Angioedema with no urticaria - May continue Meloxicam will cause urticaria - she is not taking it, so she can take off meds Tessalone ; cause rash and itching. She uses it as needed. I advised her to d/c this Gabapentin may cause Angioedema with no urticaria - she is not using this med Metformin causes rash, and she is not taking it either. Was prescribed by weight loss clinic.  She has been taking 20 mg of Zyrtec with Benadryl prn. I advised her she should take 10 mg of Zyrtec. Plan -  D/C Zyrtec Start Fexofenadine 180 mg QD Continue Pecid Hold giving another steroid - hx of MG - need to be cautious. I refilled her topical steroid and advised she add on Aveeno oatmeal cream OTC F/U soon She was appreciative of the call.

## 2023-04-28 NOTE — Telephone Encounter (Signed)
 I called to check on her. She did not answer the phone. HIPAA compliant callback message left.  Need to know if the oral steroid helped. Perhaps we can re-dose her. Continue Pepcid and antihistamine

## 2023-05-01 ENCOUNTER — Ambulatory Visit: Payer: Self-pay | Admitting: Licensed Clinical Social Worker

## 2023-05-01 NOTE — Patient Instructions (Signed)
 Visit Information  Thank you for taking time to visit with me today. Please don't hesitate to contact me if I can be of assistance to you.   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 SW will email some resources and have her added to the [employer] serve.  SW completed the SDOH and no other concerns SW will follow up 05/19/2023 at 11:15 am        Our next appointment is by telephone on 05/02/2023 at 11:15 am  Please call the care guide team at [phone] if you need to cancel or reschedule your appointment.   If you are experiencing a Mental Health or Behavioral Health Crisis or need someone to talk to, please call the Suicide and Crisis Lifeline: 988 go to [facility] Urgent [facility] [address] [place] [phone]) call 911  Patient verbalizes understanding of instructions and care plan provided today and agrees to view in MyChart. Active MyChart status and patient understanding of how to access instructions and care plan via MyChart confirmed with patient.     Jeanie Cooks, PhD [facility], [facility] Social Worker Direct Dial: [phone]  Fax: [phone]

## 2023-05-01 NOTE — Patient Outreach (Signed)
 Care Coordination   Initial Visit Note   05/01/2023 Name: Frances Medina MRN: [id number] DOB: 02-05-1961  Frances Medina is a 63 y.o. year old female who sees Doreene Eland, MD for primary care. I spoke with  Frances Medina by phone today.  What matters to the patients health and wellness today?  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 SW will email some resources and have her added to the [employer] serve.  SW completed the SDOH and no other concerns SW will follow up 05/19/2023 at 11:15 am        SDOH assessments and interventions completed:  Yes  SDOH Interventions Today    Flowsheet Row Most Recent Value  SDOH Interventions   Food Insecurity Interventions Intervention Not Indicated  Housing Interventions Intervention Not Indicated  Transportation Interventions Intervention Not Indicated  Utilities Interventions Intervention Not Indicated  Social Connections Interventions Community Resources Provided, Other (Comment)  [SW will mail information about Senior programs ie. silver sneakers etc.]        Care Coordination Interventions:  Yes, provided  Interventions Today    Flowsheet Row Most Recent Value  General Interventions   General Interventions Discussed/Reviewed General Interventions Reviewed, [employer]  [SW will mail our some seniors resources for communit engagement]        Follow up plan: Follow up call scheduled for 05/19/2023 at 11:15 am    Encounter Outcome:  Patient Visit Completed  Jeanie Cooks, PhD [facility], [facility] Social Worker Direct Dial: [phone]  Fax: [phone]

## 2023-05-04 ENCOUNTER — Encounter: Payer: Self-pay | Admitting: Family Medicine

## 2023-05-05 ENCOUNTER — Ambulatory Visit (INDEPENDENT_AMBULATORY_CARE_PROVIDER_SITE_OTHER): Admitting: Family Medicine

## 2023-05-05 ENCOUNTER — Encounter: Payer: Self-pay | Admitting: Family Medicine

## 2023-05-05 ENCOUNTER — Other Ambulatory Visit: Payer: Self-pay | Admitting: *Deleted

## 2023-05-05 ENCOUNTER — Other Ambulatory Visit: Payer: Self-pay | Admitting: Family Medicine

## 2023-05-05 ENCOUNTER — Telehealth: Payer: Self-pay | Admitting: *Deleted

## 2023-05-05 VITALS — BP 129/55 | HR 73 | Ht 60.0 in | Wt 305.4 lb

## 2023-05-05 DIAGNOSIS — L508 Other urticaria: Secondary | ICD-10-CM | POA: Diagnosis not present

## 2023-05-05 MED ORDER — FEXOFENADINE HCL 180 MG PO TABS
180.0000 mg | ORAL_TABLET | Freq: Two times a day (BID) | ORAL | 1 refills | Status: DC
Start: 2023-05-05 — End: 2023-06-30

## 2023-05-05 MED ORDER — PREDNISONE 10 MG PO TABS: ORAL_TABLET | ORAL | 0 refills | Status: DC

## 2023-05-05 NOTE — Telephone Encounter (Signed)
 Patient returns call to nurse line.   She reports feeling like her skin is "burning from the inside out." She states that hives are from head to toe. She denies drainage from areas. Feels that her body is hot, however, unknown fever.   She also reports increased shortness of breath with exertion. She states that she will be out of breath from walking to the bathroom from the couch. She does take albuterol and advair inhalers which she reports helps with her breathing. She denies current chest pain.   Due to above mentioned symptoms, advised that patient be evaluated today. Scheduled same day appointment with Dr. Ardyth Harps this morning.   Veronda Prude, RN

## 2023-05-05 NOTE — Patient Instructions (Signed)
 It was wonderful to see you today! Thank you for choosing [facility].   Please bring ALL of your medications with you to every visit.   Today we talked about:  Please increase the Allegra to twice per day and I am going to put you on a steroid taper. Please take Prednisone 40mg  x 1 week and reduce by 10 mg every week until you run out of tablets.  Please schedule back to see Dr. Lum Babe in 3 to 4 weeks to discuss further and hopefully get you into allergy as quickly as possible.  Please follow up in 3 weeks  Call the clinic at [phone] if your symptoms worsen or you have any concerns.  Please be sure to schedule follow up at the front desk before you leave today.   Elberta Fortis, DO Family Medicine

## 2023-05-05 NOTE — Telephone Encounter (Signed)
 Called patient to discuss concern further. She did not answer. LVM asking that she return call to office to discuss further.   Veronda Prude, RN

## 2023-05-05 NOTE — Progress Notes (Signed)
    SUBJECTIVE:   CHIEF COMPLAINT / HPI:   Chronic urticaria Originally started December 2024 after having viral illness.  Now ongoing symptoms despite taking fexofenadine and montelukast.  Topical steroids were ineffective.  Prednisone 20 mg x 3 days also did not help much.  Currently taking Benadryl every 3 hours and using green alcohol topically to soothe the itching.  Reports full body burning.  Some associated shortness of breath with exertion due to feeling irritation in her throat.   Unclear trigger but could be worsened when she sits on her couch, has been covering it up.  PERTINENT  PMH / PSH: OSA, allergies, GERD, GAD  OBJECTIVE:   BP (!) 129/55   Pulse 73   Ht 5' (1.524 m)   Wt (!) 305 lb 6.4 oz (138.5 kg)   SpO2 99%   BMI 59.64 kg/m    General: NAD, pleasant, able to participate in exam Cardiac: RRR, no murmurs. Respiratory: CTAB, normal effort, No wheezes, rales or rhonchi Skin: warm and dry.  Multiple raised, erythematous wheals on bilateral upper arms, mid back, abdomen, right shin.      ASSESSMENT/PLAN:   Assessment & Plan Chronic urticaria Persistently symptomatic despite Fexofenadine 180mg  daily and Montelukast 10mg . Notable wheals upon exam, symptoms significantly impacting ADLs given persistent itching/burning. -Increased to fexofenadine 180 mg twice daily -Start prednisone taper -Contacted [facility], moved appointment up to 10AM tomorrow (3/5) for further evaluation     Dr. Elberta Fortis, DO [facility]

## 2023-05-05 NOTE — Telephone Encounter (Signed)
 Noted. Thanks.

## 2023-05-05 NOTE — Patient Outreach (Signed)
  Care Management   Follow Up Note   05/05/2023 Name: Frances Medina MRN: [id number] DOB: 01-20-1961   Referred by: Doreene Eland, MD Reason for referral : Care Management (RNCM: ATTEMPT Follow Up For Chronic Disease Management & Care Coordination Needs)   An unsuccessful telephone outreach was attempted today. The patient was referred to the case management team for assistance with care management and care coordination.   Follow Up Plan: The care management team will reach out to the patient again over the next 30 days.   Larey Brick, BSN RN [facility], [facility] RN Care Manager Direct Dial: [phone]  Fax: [phone]

## 2023-05-06 ENCOUNTER — Ambulatory Visit (INDEPENDENT_AMBULATORY_CARE_PROVIDER_SITE_OTHER): Admitting: Family

## 2023-05-06 ENCOUNTER — Other Ambulatory Visit: Payer: Self-pay

## 2023-05-06 ENCOUNTER — Encounter: Payer: Self-pay | Admitting: Family

## 2023-05-06 VITALS — BP 132/84 | HR 76 | Temp 98.0°F | Resp 17 | Ht 61.0 in | Wt 303.1 lb

## 2023-05-06 DIAGNOSIS — L501 Idiopathic urticaria: Secondary | ICD-10-CM

## 2023-05-06 DIAGNOSIS — L508 Other urticaria: Secondary | ICD-10-CM | POA: Diagnosis not present

## 2023-05-06 DIAGNOSIS — J45909 Unspecified asthma, uncomplicated: Secondary | ICD-10-CM

## 2023-05-06 DIAGNOSIS — J45998 Other asthma: Secondary | ICD-10-CM

## 2023-05-06 MED ORDER — EPINEPHRINE 0.3 MG/0.3ML IJ SOAJ: 0.3000 mg | INTRAMUSCULAR | 1 refills | Status: AC | PRN

## 2023-05-06 NOTE — Patient Instructions (Addendum)
 Idiopathic Urticaria (Hives): - Possibly urticaria vs erythema annulare.  Picture that patient showed me appear to be urticaria while pictures in chart from PCP appear like erythema annulare but can't tell if there is scaling.  - At this time etiology of hives and swelling is unknown. Hives can be caused by a variety of different triggers including illness/infection, exercise, pressure, vibrations, extremes of temperature to name a few however majority of the time there is no identifiable trigger.  -We will obtain labs to rule out inflammatory/autoimmune/alpha gal/mast cell causes. STOP Zyrtec (cetirizine) and Benadryl Stop Robitussin cough syrup and green rubbing alcohol - Continue Allegra (fexofenadine) 180 mg 1 tablet twice a day. May increase to 2 tablets twice a day if needed for itching. Caution as it may make you sleepy -Continue famotidine 20 mg twice a day - Continue Singulair 10 mg once a day - Continue prednisone as prescribed by your primary care physician -Information given on Xolair  History of Penicillin Allergy - Passed Amoxicillin oral challenge on 03/28/22   Chronic Rhinitis: - Positive skin test 03/2022: none - Avoidance measures discussed. - Use nasal saline rinses before nose sprays such as with Neilmed Sinus Rinse.  Use distilled water.   - Use Flonase 2 sprays each nostril daily. Aim upward and outward. - Use Allegra (fexofenadine)180 mg as above  Food allergy:  - please strictly avoid shellfish, avocado, pineapple. Reactions to avocado and pineapple might oral allergy syndrome.  Reaction to shellfish concerning for anaphylaxis.  - SPT 03/2022 was negative.  Lab work negative - prescription for EpiPen sent. Demonstration given. Emergency Action plan given - for SKIN only reaction, okay to take Benadryl 25mg  capsules every 6 hours  Oral Allergy Syndrome- Pineapple/Avocado: - These symptoms are typically not life-threatening and are because of a cross reaction between  a pollen you are allergic to, and to a protein in specific foods (such as fresh fruits, vegetables, and nuts). - If you can eat these things and tolerate the symptoms, it is fine to continue to do so.  If not, you may avoid these fresh fruits and vegetables.   - Heating these foods, buying them canned, and peeling these foods should allow them to be consumed without symptoms or with less symptoms.  GERD: - Continue Omperazole 20mg  daily -Avoid lying down for at least two hours after a meal or after drinking acidic beverages, like soda, or other caffeinated beverages. This can help to prevent stomach contents from flowing back into the esophagus. -Keep your head elevated while you sleep. Using an extra pillow or two can also help to prevent reflux. -Eat smaller and more frequent meals each day instead of a few large meals. This promotes digestion and can aid in preventing heartburn. -Wear loose-fitting clothes to ease pressure on the stomach, which can worsen heartburn and reflux. -Reduce excess weight around the midsection. This can ease pressure on the stomach. Such pressure can force some stomach contents back up the esophagus.   Reactive Airway Disease - Follow up with Pulm.  Continue Advair 115-21 2 puffs twice daily as prescribed by them.    Follow up : keep already scheduled follow up appointment on 06/30/23 with Dr. Allena Katz

## 2023-05-06 NOTE — Progress Notes (Signed)
 [address] [name] [address] Dept: [phone]  FOLLOW UP NOTE  Patient ID: Frances Medina, female    DOB: [birth year]  Age: 63 y.o. MRN: [id number] Date of Office Visit: 05/06/2023  Assessment  Chief Complaint: Urticaria and Pruritus  HPI Frances Medina is a 63 year old female who presents today for an acute visit of hives.  She was last seen on March 28, 2022 by Dr. Allena Katz for an amoxicillin oral challenge which she passed.  Prior to that she was seen by Dr. Allena Katz on urinary 12th 2024 for idiopathic urticaria, anaphylactic shock due to shellfish, urticaria due to drug allergy, allergic rhinitis, pollen food allergy, morbid obesity, gastroesophageal reflux disease, and adverse effect of penicillin.  She reports that on December 10 she had COVID-19 and after that she developed hives. Her symptoms start with getting chills/being cold first and then she will become itchy and feel like she is burning from the inside.She denies any new medications, products, or foods.She also denies any concomitant cardiorespiratory and gastrointestinal symptoms. No one else in her household has this rash. She denies any recent insect bites/stings. She does have joint pain in her knees and shoulders. She does have bruising at the sites that she feels is due to scratching. She is not able to sleep due to the hives.  She has tried taking plenty of Benadryl.  She has been taking 3 tablets every 3 hours.  She also will take Robitussin cough syrup to help with the hives.  She is rubbing green alcohol on her skin. She also has tried betamethasone cream and it did not help.  She did see someone from her family practice and was given prednisone yesterday and it along with the Benadryl has helped some.  She then mentions that she alternates between Zyrtec and Allegra, but she had difficulty quantifying how much.  She also is taking Singulair daily and Pepcid twice a day.  She mentions that on February 12 she had a  biopsy,but will find out the results tomorrow. The results show: "Collected Date: 04/15/2023  Received Date: 04/16/2023   FINAL DIAGNOSIS        1. Skin, left arm, punch biopsy :       URTICARIA "  Chronic rhinitis: She denies rhinorrhea, nasal congestion, and post nasal drip.  Food allergy: She continues to avoid shellfish, avocado, and pineapple without any accidental ingestion.  Reflux is reported as controlled with omeprazole and Pepcid.  Reactive airway disease: She continues to follow up with pulmonology and uses the medications they prescribed. She has a follow up appointment at the end of this month.  Drug Allergies:  Allergies  Allergen Reactions   Ciprofloxacin Shortness Of Breath and Rash   Naproxen Shortness Of Breath   Shrimp [Shellfish Allergy] Hives and Shortness Of Breath    ER required   Dicyclomine Hives   Methocarbamol Hives   Pineapple Itching    Itchy lips and tongue   Morphine And Codeine Other (See Comments)    Severe headache    Review of Systems: Negative except as per HPI   Physical Exam: BP 132/84 (BP Location: Right Arm, Patient Position: Sitting, Cuff Size: Large)   Pulse 76   Temp 98 F (36.7 C) (Temporal)   Resp 17   Ht 5\' 1"  (1.549 m)   Wt (!) 303 lb 1.6 oz (137.5 kg)   SpO2 98%   BMI 57.27 kg/m    Physical Exam Constitutional:  Appearance: Normal appearance.  HENT:     Head: Normocephalic and atraumatic.     Comments: Pharynx normal. Eyes normal. Ears normal. Nose normal    Right Ear: Tympanic membrane, ear canal and external ear normal.     Left Ear: Tympanic membrane, ear canal and external ear normal.     Nose: Nose normal.     Mouth/Throat:     Mouth: Mucous membranes are moist.     Pharynx: Oropharynx is clear.  Eyes:     Conjunctiva/sclera: Conjunctivae normal.  Cardiovascular:     Rate and Rhythm: Regular rhythm.     Heart sounds: Normal heart sounds.  Pulmonary:     Effort: Pulmonary effort is normal.      Breath sounds: Normal breath sounds.     Comments: Lungs clear to auscultation Musculoskeletal:     Cervical back: Neck supple.  Skin:    General: Skin is warm.     Comments: Urticarial lesions noted with bruising and excoriation marks noted on bilateral arms  Neurological:     Mental Status: She is alert and oriented to person, place, and time.  Psychiatric:        Mood and Affect: Mood normal.        Behavior: Behavior normal.        Thought Content: Thought content normal.        Judgment: Judgment normal.     Diagnostics: FVC 1.43 L (59%), FEV1 1.28 L (67%), FEV1/FVC 0.90.  Spirometry indicates possible restrictive defect.  Assessment and Plan: 1. Idiopathic urticaria   2. Reactive airway disease without complication, unspecified asthma severity, unspecified whether persistent   3. Chronic urticaria     Meds ordered this encounter  Medications   EPINEPHrine 0.3 mg/0.3 mL IJ SOAJ injection    Sig: Inject 0.3 mg into the muscle as needed for anaphylaxis.    Dispense:  2 each    Refill:  1    Patient Instructions  Idiopathic Urticaria (Hives): - Possibly urticaria vs erythema annulare.  Picture that patient showed me appear to be urticaria while pictures in chart from PCP appear like erythema annulare but can't tell if there is scaling.  - At this time etiology of hives and swelling is unknown. Hives can be caused by a variety of different triggers including illness/infection, exercise, pressure, vibrations, extremes of temperature to name a few however majority of the time there is no identifiable trigger.  -We will obtain labs to rule out inflammatory/autoimmune/alpha gal/mast cell causes. STOP Zyrtec (cetirizine) and Benadryl Stop Robitussin cough syrup and green rubbing alcohol - Continue Allegra (fexofenadine) 180 mg 1 tablet twice a day. May increase to 2 tablets twice a day if needed for itching. Caution as it may make you sleepy -Continue famotidine 20 mg twice a  day - Continue Singulair 10 mg once a day - Continue prednisone as prescribed by your primary care physician -Information given on Xolair  History of Penicillin Allergy - Passed Amoxicillin oral challenge on 03/28/22   Chronic Rhinitis: - Positive skin test 03/2022: none - Avoidance measures discussed. - Use nasal saline rinses before nose sprays such as with Neilmed Sinus Rinse.  Use distilled water.   - Use Flonase 2 sprays each nostril daily. Aim upward and outward. - Use Allegra (fexofenadine)180 mg as above  Food allergy:  - please strictly avoid shellfish, avocado, pineapple. Reactions to avocado and pineapple might oral allergy syndrome.  Reaction to shellfish concerning for anaphylaxis.  - SPT 03/2022  was negative.  Lab work negative - prescription for EpiPen sent. Demonstration given. Emergency Action plan given - for SKIN only reaction, okay to take Benadryl 25mg  capsules every 6 hours  Oral Allergy Syndrome- Pineapple/Avocado: - These symptoms are typically not life-threatening and are because of a cross reaction between a pollen you are allergic to, and to a protein in specific foods (such as fresh fruits, vegetables, and nuts). - If you can eat these things and tolerate the symptoms, it is fine to continue to do so.  If not, you may avoid these fresh fruits and vegetables.   - Heating these foods, buying them canned, and peeling these foods should allow them to be consumed without symptoms or with less symptoms.  GERD: - Continue Omperazole 20mg  daily -Avoid lying down for at least two hours after a meal or after drinking acidic beverages, like soda, or other caffeinated beverages. This can help to prevent stomach contents from flowing back into the esophagus. -Keep your head elevated while you sleep. Using an extra pillow or two can also help to prevent reflux. -Eat smaller and more frequent meals each day instead of a few large meals. This promotes digestion and can aid in  preventing heartburn. -Wear loose-fitting clothes to ease pressure on the stomach, which can worsen heartburn and reflux. -Reduce excess weight around the midsection. This can ease pressure on the stomach. Such pressure can force some stomach contents back up the esophagus.   Reactive Airway Disease - Follow up with Pulm.  Continue Advair 115-21 2 puffs twice daily as prescribed by them.    Follow up : keep already scheduled follow up appointment on 06/30/23 with Dr. Allena Katz  Return in about 8 weeks (around 06/30/2023), or if symptoms worsen or fail to improve.    Thank you for the opportunity to care for this patient.  Please do not hesitate to contact me with questions.  Nehemiah Settle, FNP [facility]

## 2023-05-07 ENCOUNTER — Ambulatory Visit (INDEPENDENT_AMBULATORY_CARE_PROVIDER_SITE_OTHER): Payer: Medicare Other | Admitting: Family Medicine

## 2023-05-07 VITALS — BP 136/75 | HR 88

## 2023-05-07 DIAGNOSIS — L508 Other urticaria: Secondary | ICD-10-CM | POA: Diagnosis not present

## 2023-05-07 NOTE — Patient Instructions (Addendum)
 Please continue all of your current medications as prescribed and follow up closely with your allergist  Hives Hives are itchy, red, swollen areas on your skin. They can show up on any part of your body. They often go away within 24 hours (acute hives). If you get new hives after the old ones fade and this goes on for many days or weeks, it is called chronic hives. Hives do not spread from person to person (are not contagious). Hives can happen when your body reacts to something that you are allergic to (allergen). These are sometimes called triggers. You can get hives right after being around a trigger, or hours later. What are the causes? Food allergies. Insect bites or stings. Allergies to pollen or pets. Spending time in sunlight, heat, or cold. Exercise. Stress. Other causes, such as: Viruses. This includes the common cold. Infections caused by germs (bacteria). Some medicines. Chemicals or latex. Allergy shots. Blood transfusions. In some cases, the cause is not known. What increases the risk? Being female. Being allergic to foods, such as: Citrus fruits. Milk. Eggs. Peanuts. Tree nuts. Shellfish. Being allergic to: Medicines. Latex. Insects. Animals. Pollen. What are the signs or symptoms?  Itchy, red or white bumps or spots on your skin. These areas may: Swell and get bigger. Change in shape and location. Stand alone or connect to each other over a large area of skin. Sting or hurt. Turn white when pressed in the center (blanch). In very bad cases, your hands, feet, and face may also swell. This may happen if hives start deeper in your skin. How is this treated? Treatment for hives depends on your symptoms. You may need to: Use cool, wet cloths (cool compresses) or take cool showers to stop the itching. Take or apply medicines to: Help with itching (antihistamines). Lessen swelling (corticosteroids). Treat infection (antibiotics). Have a medicine called  omalizumab given to you as a shot. You may need this if your hives do not get better with other treatments. In very bad cases, you may need to use a device filled with medicine that gives an emergency shot of epinephrine (auto-injector pen) to stop a very bad allergic reaction (anaphylactic reaction). Follow these instructions at home: Medicines Take or apply over-the-counter and prescription medicines only as told by your doctor. If you were prescribed antibiotics, use them as told by your doctor. Do not stop using them even if you start to feel better. Skin care Put cool, wet cloths on the hives. Do not scratch your skin. Do not rub your skin. General instructions Do not take hot showers or baths. This can make itching worse. Do not wear tight clothes. Use sunscreen. Wear clothes that cover your skin when you are outside. Avoid triggers that cause your hives. Keep a journal to help track what causes your hives. Write down: What medicines you take. What you eat and drink. What you put on your skin. Keep all follow-up visits. Your doctor will need to make sure treatment is working. Contact a doctor if: Your symptoms do not get better with medicine. Your joints hurt or swell. You have a fever. You have pain in your belly (abdomen). Get help right away if: Your tongue or lips swell. Your eyelids are swollen. Your chest or throat feels tight. You have trouble breathing or swallowing. These symptoms may be an emergency. Get help right away. Call 911. Do not wait to see if the symptoms will go away. Do not drive yourself to the hospital. This  information is not intended to replace advice given to you by your health care provider. Make sure you discuss any questions you have with your health care provider. Document Revised: 11/05/2021 Document Reviewed: 11/05/2021 Elsevier Patient Education  2024 [employer].

## 2023-05-07 NOTE — Progress Notes (Signed)
    SUBJECTIVE:   CHIEF COMPLAINT / HPI:   Presents regarding skin biopsy results which showed evidence of idiopathic urticaria She saw allergist 3/5 who obtained variety of labs, recommended Allegra twice daily, famotidine twice daily, Singulair daily, prednisone as well as avoiding food allergens.  EpiPen was prescribed.  Reports symptoms have significantly improved especially since seeing allergist. Able to get some sleep and rash/itching has improved, nearly resolved.  Reports adherence to prednisone taper.   PERTINENT  PMH / PSH: OSA, allergies, GERD, GAD  OBJECTIVE:   BP 136/75   Pulse 88   SpO2 97%   General: NAD, pleasant, able to participate in exam Respiratory: No respiratory distress Skin: Some scattered excoriations noted across b/l upper and lower extremities, no open wounds, no obvious hives or rash noted today Psych: Normal affect and mood  ASSESSMENT/PLAN:   Assessment & Plan Chronic urticaria Continue prednisone taper as prescribed.  Continue Allegra twice daily, famotidine twice daily, Singulair daily.  Follow-up with allergy for further evaluation and management.   Vonna Drafts, MD [facility]

## 2023-05-09 ENCOUNTER — Other Ambulatory Visit: Payer: Self-pay | Admitting: Internal Medicine

## 2023-05-09 DIAGNOSIS — J3089 Other allergic rhinitis: Secondary | ICD-10-CM

## 2023-05-11 ENCOUNTER — Other Ambulatory Visit (INDEPENDENT_AMBULATORY_CARE_PROVIDER_SITE_OTHER)

## 2023-05-11 ENCOUNTER — Other Ambulatory Visit: Payer: Self-pay | Admitting: Family Medicine

## 2023-05-11 ENCOUNTER — Encounter: Payer: Self-pay | Admitting: Family Medicine

## 2023-05-11 DIAGNOSIS — R350 Frequency of micturition: Secondary | ICD-10-CM

## 2023-05-11 LAB — POCT URINALYSIS DIP (MANUAL ENTRY)
Bilirubin, UA: NEGATIVE
Blood, UA: NEGATIVE
Glucose, UA: 500 mg/dL — AB
Ketones, POC UA: NEGATIVE mg/dL
Leukocytes, UA: NEGATIVE
Nitrite, UA: POSITIVE — AB
Spec Grav, UA: 1.025 (ref 1.010–1.025)
Urobilinogen, UA: 0.2 U/dL
pH, UA: 5.5 (ref 5.0–8.0)

## 2023-05-11 LAB — POCT UA - MICROSCOPIC ONLY: RBC, Urine, Miroscopic: NONE SEEN (ref 0–2)

## 2023-05-11 MED ORDER — SULFAMETHOXAZOLE-TRIMETHOPRIM 800-160 MG PO TABS: 1.0000 | ORAL_TABLET | Freq: Two times a day (BID) | ORAL | 0 refills | Status: AC

## 2023-05-11 MED ORDER — NITROFURANTOIN MONOHYD MACRO 100 MG PO CAPS: 100.0000 mg | ORAL_CAPSULE | Freq: Two times a day (BID) | ORAL | 0 refills | Status: DC

## 2023-05-11 NOTE — Progress Notes (Signed)
 Nitrofurantoin canceled - confirmed by pharmacy - Dorene Grebe.

## 2023-05-12 ENCOUNTER — Other Ambulatory Visit: Payer: Self-pay | Admitting: Family Medicine

## 2023-05-13 ENCOUNTER — Encounter: Payer: Self-pay | Admitting: Family Medicine

## 2023-05-13 LAB — URINE CULTURE

## 2023-05-15 LAB — CBC WITH DIFFERENTIAL/PLATELET
Basophils Absolute: 0 10*3/uL (ref 0.0–0.2)
Basos: 0 %
EOS (ABSOLUTE): 0 10*3/uL (ref 0.0–0.4)
Eos: 0 %
Hematocrit: 36.9 % (ref 34.0–46.6)
Hemoglobin: 11.5 g/dL (ref 11.1–15.9)
Immature Grans (Abs): 0.1 10*3/uL (ref 0.0–0.1)
Immature Granulocytes: 1 %
Lymphocytes Absolute: 1.6 10*3/uL (ref 0.7–3.1)
Lymphs: 17 %
MCH: 22.2 pg — ABNORMAL LOW (ref 26.6–33.0)
MCHC: 31.2 g/dL — ABNORMAL LOW (ref 31.5–35.7)
MCV: 71 fL — ABNORMAL LOW (ref 79–97)
Monocytes Absolute: 0.3 10*3/uL (ref 0.1–0.9)
Monocytes: 3 %
Neutrophils Absolute: 7.8 10*3/uL — ABNORMAL HIGH (ref 1.4–7.0)
Neutrophils: 79 %
Platelets: 159 10*3/uL (ref 150–450)
RBC: 5.19 x10E6/uL (ref 3.77–5.28)
RDW: 16.9 % — ABNORMAL HIGH (ref 11.7–15.4)
WBC: 9.7 10*3/uL (ref 3.4–10.8)

## 2023-05-15 LAB — THYROGLOBULIN ANTIBODY: Thyroglobulin Antibody: <1 [IU]/mL (ref 0.0–0.9)

## 2023-05-15 LAB — ALPHA-GAL PANEL
Allergen Lamb IgE: <0.1 kU/L
Beef IgE: <0.1 kU/L
IgE (Immunoglobulin E), Serum: <2 [IU]/mL — ABNORMAL LOW (ref 6–495)
O215-IgE Alpha-Gal: <0.1 kU/L
Pork IgE: <0.1 kU/L

## 2023-05-15 LAB — COMPREHENSIVE METABOLIC PANEL
ALT: 12 IU/L (ref 0–32)
AST: 14 IU/L (ref 0–40)
Albumin: 4.4 g/dL (ref 3.9–4.9)
Alkaline Phosphatase: 118 IU/L (ref 44–121)
BUN/Creatinine Ratio: 16 (ref 12–28)
BUN: 11 mg/dL (ref 8–27)
Bilirubin Total: 0.2 mg/dL (ref 0.0–1.2)
CO2: 25 mmol/L (ref 20–29)
Calcium: 9.5 mg/dL (ref 8.7–10.3)
Chloride: 106 mmol/L (ref 96–106)
Creatinine, Ser: 0.67 mg/dL (ref 0.57–1.00)
Globulin, Total: 2.5 g/dL (ref 1.5–4.5)
Glucose: 85 mg/dL (ref 70–99)
Potassium: 4.1 mmol/L (ref 3.5–5.2)
Sodium: 144 mmol/L (ref 134–144)
Total Protein: 6.9 g/dL (ref 6.0–8.5)
eGFR: 99 mL/min/{1.73_m2} (ref >59)

## 2023-05-15 LAB — CHRONIC URTICARIA

## 2023-05-15 LAB — SEDIMENTATION RATE: Sed Rate: 99 mm/h — ABNORMAL HIGH (ref 0–40)

## 2023-05-15 LAB — C4 COMPLEMENT: Complement C4, Serum: 18 mg/dL (ref 12–38)

## 2023-05-15 LAB — ANA W/REFLEX: Anti Nuclear Antibody (ANA): NEGATIVE

## 2023-05-15 LAB — TRYPTASE: Tryptase: 13.2 ug/L (ref 2.2–13.2)

## 2023-05-15 LAB — THYROID PEROXIDASE ANTIBODY: Thyroperoxidase Ab SerPl-aCnc: 74 [IU]/mL — ABNORMAL HIGH (ref 0–34)

## 2023-05-19 ENCOUNTER — Ambulatory Visit: Payer: Self-pay | Admitting: Licensed Clinical Social Worker

## 2023-05-19 NOTE — Patient Outreach (Signed)
 Care Coordination   05/19/2023 Name: JAMES LAFALCE MRN: [id number] DOB: [birth year]   Care Coordination Outreach Attempts:  An unsuccessful outreach was attempted for an appointment today.  Follow Up Plan:  Additional outreach attempts will be made to offer the patient complex care management information and services.   Encounter Outcome:  No Answer   Care Coordination Interventions:  No, not indicated     SW will attempt a 2nd follow up on 05/26/2023 at 2:00 pm  Jeanie Cooks, PhD [facility], [facility] Social Worker Direct Dial: [phone]  Fax: [phone]

## 2023-05-19 NOTE — Progress Notes (Signed)
 Please let Gaytha know that we received her lab results.  Chronic urticaria index: is elevated. This leans towards autoimmune urticaria.  Thyroid peroxidase antibody is elevated. Your body makes an antibody against a thyroid peroxidase antibody which is a thyroid antibody. This does not indicate that you have thyroid disease however may be at increased risk of developing thyroid disease in the future.  Alpha gal: normal. Rules out red meat allergy  Sed rate: an inflammatory marker is elevated and looks like it has been elevated in the past.  Cbc with diff shows anemia  Comprehensive metabolic panel: normal.  Tryptase: elevated. Lets repeat this lab at your next office visit on 06/30/23 @ 11 AM with Dr. Allena Katz  Thyroglobulin antibody: normal  C4 normal  ANA : normal   Please send a copy of her lab results to her primary care provider.

## 2023-05-23 ENCOUNTER — Other Ambulatory Visit: Payer: Self-pay | Admitting: Family Medicine

## 2023-05-25 ENCOUNTER — Emergency Department (HOSPITAL_BASED_OUTPATIENT_CLINIC_OR_DEPARTMENT_OTHER)

## 2023-05-25 ENCOUNTER — Other Ambulatory Visit: Payer: Self-pay

## 2023-05-25 ENCOUNTER — Emergency Department (HOSPITAL_BASED_OUTPATIENT_CLINIC_OR_DEPARTMENT_OTHER)
Admission: EM | Admit: 2023-05-25 | Discharge: 2023-05-25 | Disposition: A | Discharge status: Home / Self Care | Attending: Emergency Medicine | Admitting: Emergency Medicine

## 2023-05-25 ENCOUNTER — Encounter (HOSPITAL_BASED_OUTPATIENT_CLINIC_OR_DEPARTMENT_OTHER): Payer: Self-pay

## 2023-05-25 DIAGNOSIS — Z7951 Long term (current) use of inhaled steroids: Secondary | ICD-10-CM | POA: Diagnosis not present

## 2023-05-25 DIAGNOSIS — Z7984 Long term (current) use of oral hypoglycemic drugs: Secondary | ICD-10-CM | POA: Insufficient documentation

## 2023-05-25 DIAGNOSIS — D649 Anemia, unspecified: Secondary | ICD-10-CM | POA: Diagnosis not present

## 2023-05-25 DIAGNOSIS — I10 Essential (primary) hypertension: Secondary | ICD-10-CM | POA: Diagnosis not present

## 2023-05-25 DIAGNOSIS — R0602 Shortness of breath: Secondary | ICD-10-CM | POA: Diagnosis present

## 2023-05-25 DIAGNOSIS — D72829 Elevated white blood cell count, unspecified: Secondary | ICD-10-CM | POA: Diagnosis not present

## 2023-05-25 DIAGNOSIS — J45901 Unspecified asthma with (acute) exacerbation: Secondary | ICD-10-CM | POA: Insufficient documentation

## 2023-05-25 DIAGNOSIS — B974 Respiratory syncytial virus as the cause of diseases classified elsewhere: Secondary | ICD-10-CM | POA: Diagnosis not present

## 2023-05-25 DIAGNOSIS — B338 Other specified viral diseases: Secondary | ICD-10-CM

## 2023-05-25 LAB — I-STAT VENOUS BLOOD GAS, ED
Acid-Base Excess: 2 mmol/L (ref 0.0–2.0)
Bicarbonate: 25.1 mmol/L (ref 20.0–28.0)
Calcium, Ion: 1.24 mmol/L (ref 1.15–1.40)
HCT: 56 % — ABNORMAL HIGH (ref 36.0–46.0)
Hemoglobin: 19 g/dL — ABNORMAL HIGH (ref 12.0–15.0)
O2 Saturation: 94 %
Patient temperature: 98.4
Potassium: 3.9 mmol/L (ref 3.5–5.1)
Sodium: 137 mmol/L (ref 135–145)
TCO2: 26 mmol/L (ref 22–32)
pCO2, Ven: 34.3 mmHg — ABNORMAL LOW (ref 44–60)
pH, Ven: 7.473 — ABNORMAL HIGH (ref 7.25–7.43)
pO2, Ven: 67 mmHg — ABNORMAL HIGH (ref 32–45)

## 2023-05-25 LAB — CBC WITH DIFFERENTIAL/PLATELET
Abs Immature Granulocytes: 0.08 10*3/uL — ABNORMAL HIGH (ref 0.00–0.07)
Basophils Absolute: 0 10*3/uL (ref 0.0–0.1)
Basophils Relative: 0 %
Eosinophils Absolute: 0 10*3/uL (ref 0.0–0.5)
Eosinophils Relative: 0 %
HCT: 37.4 % (ref 36.0–46.0)
Hemoglobin: 11.4 g/dL — ABNORMAL LOW (ref 12.0–15.0)
Immature Granulocytes: 1 %
Lymphocytes Relative: 20 %
Lymphs Abs: 2.4 10*3/uL (ref 0.7–4.0)
MCH: 22.3 pg — ABNORMAL LOW (ref 26.0–34.0)
MCHC: 30.5 g/dL (ref 30.0–36.0)
MCV: 73.2 fL — ABNORMAL LOW (ref 80.0–100.0)
Monocytes Absolute: 0.5 10*3/uL (ref 0.1–1.0)
Monocytes Relative: 4 %
Neutro Abs: 9 10*3/uL — ABNORMAL HIGH (ref 1.7–7.7)
Neutrophils Relative %: 75 %
Platelets: 211 10*3/uL (ref 150–400)
RBC: 5.11 MIL/uL (ref 3.87–5.11)
RDW: 19.6 % — ABNORMAL HIGH (ref 11.5–15.5)
WBC: 11.9 10*3/uL — ABNORMAL HIGH (ref 4.0–10.5)
nRBC: 0 % (ref 0.0–0.2)

## 2023-05-25 LAB — BRAIN NATRIURETIC PEPTIDE: B Natriuretic Peptide: 31.5 pg/mL (ref 0.0–100.0)

## 2023-05-25 LAB — BASIC METABOLIC PANEL
Anion gap: 11 (ref 5–15)
BUN: 19 mg/dL (ref 8–23)
CO2: 25 mmol/L (ref 22–32)
Calcium: 9.2 mg/dL (ref 8.9–10.3)
Chloride: 102 mmol/L (ref 98–111)
Creatinine, Ser: 0.77 mg/dL (ref 0.44–1.00)
GFR, Estimated: >60 mL/min (ref >60)
Glucose, Bld: 345 mg/dL — ABNORMAL HIGH (ref 70–99)
Potassium: 3.8 mmol/L (ref 3.5–5.1)
Sodium: 138 mmol/L (ref 135–145)

## 2023-05-25 LAB — RESP PANEL BY RT-PCR (RSV, FLU A&B, COVID)  RVPGX2
Influenza A by PCR: NEGATIVE
Influenza B by PCR: NEGATIVE
Resp Syncytial Virus by PCR: POSITIVE — AB
SARS Coronavirus 2 by RT PCR: NEGATIVE

## 2023-05-25 LAB — TROPONIN I (HIGH SENSITIVITY): Troponin I (High Sensitivity): 5 ng/L (ref <18)

## 2023-05-25 MED ORDER — PREDNISONE 10 MG PO TABS: 40.0000 mg | ORAL_TABLET | Freq: Every day | ORAL | 0 refills | Status: AC

## 2023-05-25 MED ORDER — IPRATROPIUM-ALBUTEROL 0.5-2.5 (3) MG/3ML IN SOLN
3.0000 mL | Freq: Once | RESPIRATORY_TRACT | Status: AC
  Administered 2023-05-25: 3 mL via RESPIRATORY_TRACT
  Filled 2023-05-25: qty 3

## 2023-05-25 MED ORDER — IPRATROPIUM-ALBUTEROL 0.5-2.5 (3) MG/3ML IN SOLN
3.0000 mL | RESPIRATORY_TRACT | Status: AC
Start: 2023-05-25 — End: 2023-05-25
  Administered 2023-05-25 (×2): 3 mL via RESPIRATORY_TRACT
  Filled 2023-05-25 (×2): qty 3

## 2023-05-25 MED ORDER — ALBUTEROL SULFATE (2.5 MG/3ML) 0.083% IN NEBU
2.5000 mg | INHALATION_SOLUTION | Freq: Once | RESPIRATORY_TRACT | Status: AC
  Administered 2023-05-25: 2.5 mg via RESPIRATORY_TRACT
  Filled 2023-05-25: qty 3

## 2023-05-25 NOTE — ED Provider Notes (Signed)
 [place] EMERGENCY DEPARTMENT AT [facility] Provider Note   CSN: [id number] Arrival date & time: 05/25/23  7829     History  Chief Complaint  Patient presents with   Shortness of Breath    Frances Medina is a 63 y.o. female.   Shortness of Breath Associated symptoms: cough      63 year old female with medical history significant for GERD, asthma, obesity, depression, hypertension, myasthenia gravis presenting to the emergency department with shortness of breath for the past week.  The patient endorses a productive cough, no known sick contacts.  She denies any chest pain.  She endorses persistent cough and shortness of breath with associated wheezing not amenable to her home inhalers.  No fevers or chills.  Home Medications Prior to Admission medications   Medication Sig Start Date End Date Taking? Authorizing Provider  predniSONE (DELTASONE) 10 MG tablet Take 4 tablets (40 mg total) by mouth daily for 5 days. 05/25/23 05/30/23 Yes Ernie Avena, MD  acetaminophen (TYLENOL) 500 MG tablet Take 500 mg by mouth in the morning and at bedtime.    [provider]  albuterol (PROVENTIL) (2.5 MG/3ML) 0.083% nebulizer solution Take 3 mLs (2.5 mg total) by nebulization every 6 (six) hours as needed for wheezing or shortness of breath. 04/01/23   Doreene Eland, MD  albuterol (VENTOLIN HFA) 108 (90 Base) MCG/ACT inhaler INHALE 2 PUFFS INTO THE LUNGS EVERY 6 HOURS AS NEEDED FOR WHEEZE OR SHORTNESS OF BREATH 04/01/23   Doreene Eland, MD  APPLE CIDER VINEGAR PO Take 1 capsule by mouth daily.    [provider]  Ascorbic Acid (VITAMIN C PO) Take 1,000 mg by mouth daily.    [provider]  baclofen (LIORESAL) 10 MG tablet TAKE 1 TABLET BY MOUTH AT BEDTIME AS NEEDED FOR MUSCLE SPASMS. 08/25/22   Doreene Eland, MD  betamethasone dipropionate 0.05 % cream Apply topically 2 (two) times daily. 04/28/23   Doreene Eland, MD  clonazePAM (KLONOPIN) 0.5 MG  tablet TAKE 1 TABLET BY MOUTH TWICE A DAY AS NEEDED FOR ANXIETY 05/25/23   Doreene Eland, MD  Cyanocobalamin (VITAMIN B12 PO) Take 1 tablet by mouth daily.    [provider]  EPINEPHrine 0.3 mg/0.3 mL IJ SOAJ injection Inject 0.3 mg into the muscle as needed for anaphylaxis. 05/06/23   Nehemiah Settle, FNP  famotidine (PEPCID) 20 MG tablet Take 1 tablet (20 mg total) by mouth 2 (two) times daily as needed (hives). 03/14/22   Birder Robson, MD  fexofenadine (ALLEGRA) 180 MG tablet Take 1 tablet (180 mg total) by mouth 2 (two) times daily. 05/05/23   Elberta Fortis, MD  fluticasone [facility]) 50 MCG/ACT nasal spray SPRAY 2 SPRAYS INTO EACH NOSTRIL EVERY DAY 05/11/23   Birder Robson, MD  fluticasone-salmeterol [facility] INHUB) 250-50 MCG/ACT AEPB Inhale 1 puff into the lungs in the morning and at bedtime. 08/22/22   Martina Sinner, MD  gabapentin (NEURONTIN) 300 MG capsule TAKE 1 CAPSULE BY MOUTH TWICE A DAY Patient not taking: Reported on 05/06/2023 05/28/22   Doreene Eland, MD  Menthol 10 MG LOZG Take 1 lozenge by mouth daily as needed (cough).    [provider]  metFORMIN (GLUCOPHAGE-XR) 500 MG 24 hr tablet Take 500 mg by mouth. Patient not taking: Reported on 05/06/2023 09/24/22 09/24/23  [provider]  montelukast (SINGULAIR) 10 MG tablet TAKE 1 TABLET BY MOUTH EVERYDAY AT BEDTIME 05/05/23   Doreene Eland, MD  mycophenolate (CELLCEPT) 500 MG tablet Take 500 mg by mouth 2 (two) times daily. 01/15/22   [provider]  nystatin (MYCOSTATIN/NYSTOP) powder APPLY TOPICALLY 3 TIMES DAILY AS NEEDED. Patient not taking: Reported on 05/06/2023 11/06/22   Doreene Eland, MD  omeprazole (PRILOSEC) 20 MG capsule TAKE 1 CAPSULE BY MOUTH EVERY DAY AS NEEDED 04/02/23   Doreene Eland, MD  phentermine (ADIPEX-P) 37.5 MG tablet Take 18.75 mg by mouth every morning.    [provider]  sertraline (ZOLOFT) 50 MG tablet TAKE 3 TABLETS BY MOUTH EVERY DAY 05/12/23    Janit Pagan T, MD  VITAMIN D, CHOLECALCIFEROL, PO Take 1 tablet by mouth daily. otc    [provider]      Allergies    Ciprofloxacin, Naproxen, Shrimp [shellfish allergy], Dicyclomine, Methocarbamol, Pineapple, and Morphine and codeine    Review of Systems   Review of Systems  Respiratory:  Positive for cough and shortness of breath.   All other systems reviewed and are negative.   Physical Exam Updated Vital Signs BP (!) 141/85 (BP Location: Right Arm)   Pulse 91   Temp 98.4 F (36.9 C) (Oral)   Resp 20   SpO2 99%  Physical Exam Vitals and nursing note reviewed.  Constitutional:      General: She is not in acute distress.    Appearance: She is well-developed.  HENT:     Head: Normocephalic and atraumatic.  Eyes:     Conjunctiva/sclera: Conjunctivae normal.  Cardiovascular:     Rate and Rhythm: Normal rate and regular rhythm.     Heart sounds: No murmur heard. Pulmonary:     Effort: Pulmonary effort is normal. No respiratory distress.     Breath sounds: Examination of the right-upper field reveals wheezing. Examination of the left-upper field reveals wheezing. Examination of the right-middle field reveals wheezing. Examination of the left-middle field reveals wheezing. Examination of the right-lower field reveals wheezing. Examination of the left-lower field reveals wheezing. Wheezing present.  Abdominal:     Palpations: Abdomen is soft.     Tenderness: There is no abdominal tenderness.  Musculoskeletal:        General: No swelling.     Cervical back: Neck supple.  Skin:    General: Skin is warm and dry.     Capillary Refill: Capillary refill takes less than 2 seconds.  Neurological:     Mental Status: She is alert.  Psychiatric:        Mood and Affect: Mood normal.     ED Results / Procedures / Treatments   Labs (all labs ordered are listed, but only abnormal results are displayed) Labs Reviewed  RESP PANEL BY RT-PCR (RSV, FLU A&B, COVID)   RVPGX2 - Abnormal; Notable for the following components:      Result Value   Resp Syncytial Virus by PCR POSITIVE (*)    All other components within normal limits  BASIC METABOLIC PANEL - Abnormal; Notable for the following components:   Glucose, Bld 345 (*)    All other components within normal limits  CBC WITH DIFFERENTIAL/PLATELET - Abnormal; Notable for the following components:   WBC 11.9 (*)    Hemoglobin 11.4 (*)    MCV 73.2 (*)    MCH 22.3 (*)    RDW 19.6 (*)    Neutro Abs 9.0 (*)    Abs Immature Granulocytes 0.08 (*)    All other components within normal limits  I-STAT VENOUS BLOOD GAS, ED - Abnormal;  Notable for the following components:   pH, Ven 7.473 (*)    pCO2, Ven 34.3 (*)    pO2, Ven 67 (*)    HCT 56.0 (*)    Hemoglobin 19.0 (*)    All other components within normal limits  BRAIN NATRIURETIC PEPTIDE  TROPONIN I (HIGH SENSITIVITY)    EKG EKG Interpretation Date/Time:  Monday May 25 2023 07:06:50 EDT Ventricular Rate:  88 PR Interval:  163 QRS Duration:  89 QT Interval:  344 QTC Calculation: 417 R Axis:   51  Text Interpretation: Sinus rhythm Atrial premature complexes Confirmed by Ernie Avena ([id number]) on 05/25/2023 7:10:53 AM  Radiology DG Chest Port 1 View Result Date: 05/25/2023 CLINICAL DATA:  Shortness of breath for 1 week.  History of asthma. EXAM: PORTABLE CHEST 1 VIEW COMPARISON:  02/08/2023 FINDINGS: Normal heart size and mediastinal contours. No pleural fluid, interstitial edema or airspace disease. Visualized osseous structures appear intact. IMPRESSION: No acute cardiopulmonary disease. Electronically Signed   By: Signa Kell M.D.   On: 05/25/2023 07:33    Procedures Procedures    Medications Ordered in ED Medications  albuterol (PROVENTIL) (2.5 MG/3ML) 0.083% nebulizer solution 2.5 mg (2.5 mg Nebulization Given 05/25/23 0718)  ipratropium-albuterol (DUONEB) 0.5-2.5 (3) MG/3ML nebulizer solution 3 mL (3 mLs Nebulization Given 05/25/23  0718)  ipratropium-albuterol (DUONEB) 0.5-2.5 (3) MG/3ML nebulizer solution 3 mL (3 mLs Nebulization Given 05/25/23 0757)    ED Course/ Medical Decision Making/ A&P Clinical Course as of 05/25/23 0936  Mon May 25, 2023  0828 Respiratory Syncytial Virus by PCR(!): POSITIVE [JL]    Clinical Course User Index [JL] Ernie Avena, MD                                 Medical Decision Making Amount and/or Complexity of Data Reviewed Labs:  Decision-making details documented in ED Course.  Risk Prescription drug management.    63 year old female with medical history significant for GERD, asthma, obesity, depression, hypertension, myasthenia gravis presenting to the emergency department with shortness of breath for the past week.  The patient endorses a productive cough, no known sick contacts.  She denies any chest pain.  She endorses persistent cough and shortness of breath with associated wheezing not amenable to her home inhalers.  No fevers or chills.  On arrival, the patient was afebrile, not tachycardic or tachypneic, saturating 95% on room air, BP 141/85.  Sinus rhythm noted on cardiac telemetry.  Physical exam revealed expiratory wheezing in all lung fields.  Concern for viral infection, pneumonia, asthma exacerbation.  Considered myasthenia gravis with acute exacerbation although the patient was in no respiratory distress on exam.  NIF performed with RT and VC and was greater than -40 per bedside RT. FVC of 1.9L per RT.  The patient was administered DuoNebs x 3 with subsequent resolution in her symptoms.  On repeat exam, the patient had no wheezing, was in no respiratory distress and feeling symptomatically improved.  A chest x-ray was performed which revealed clear lungs.  Laboratory evaluation revealed RSV PCR testing positive for RSV.  A BMP and a cardiac troponin was normal.  A VBG revealed a pH of 7.47, pCO2 of 34, bicarbonate normal, BMP was unremarkable other than hyperglycemia  to 345 without an anion gap acidosis.  CBC revealed mild anemia at 11.4, mild leukocytosis to 11.9.  The patient is feeling symptomatically improved, low concern for exacerbation of her myasthenia gravis  at this time.  Suspect likely mild asthma exacerbation in the setting of being RSV positive.  Patient overall in no distress, feeling symptomatically improved, stable for discharge and outpatient follow-up.  Return precautions provided in the event of any severe worsening symptoms.  Final Clinical Impression(s) / ED Diagnoses Final diagnoses:  RSV infection  Exacerbation of asthma, unspecified asthma severity, unspecified whether persistent    Rx / DC Orders ED Discharge Orders          Ordered    predniSONE (DELTASONE) 10 MG tablet  Daily        05/25/23 0930              Ernie Avena, MD 05/25/23 [phone]

## 2023-05-25 NOTE — ED Notes (Signed)
 Pt's had faint wheezes un the left, and right upper lobes. RT gave the Pt a breathing Tx. And her wheezes are mostly in her neck.

## 2023-05-25 NOTE — Discharge Instructions (Addendum)
 Your chest x-ray was clear and your labs were overall reassuring.  You had mildly elevated blood glucose.  Follow-up with your primary care provider.  Your RSV PCR test resulted positive.  We will discharge you on a short course of prednisone.  If you develop worsening shortness of breath, this could be indicative of a flareup of your myasthenia gravis, recommend you would return to the emergency department for repeat evaluation.  Your pulmonary function testing in the emergency department was reassuring.

## 2023-05-25 NOTE — ED Triage Notes (Addendum)
 Pt reports she is here today due to sob x1 week. Pt reports h/o asthma. Pt reports productive cough.

## 2023-05-25 NOTE — ED Notes (Addendum)
 NIF was greater than -40 FVC 1.9L Patient coughed a lot with both

## 2023-05-26 ENCOUNTER — Ambulatory Visit: Payer: Self-pay | Admitting: Licensed Clinical Social Worker

## 2023-05-26 NOTE — Patient Instructions (Signed)
 Visit Information  Thank you for taking time to visit with me today. Please don't hesitate to contact me if I can be of assistance to you.   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 SW will email some resources and have her added to the [employer] serve.  SW completed the SDOH and no other concerns SW will follow up 05/19/2023 at 11:15 am        No further follow up needed,   Please call the care guide team at [phone] if you need to cancel or reschedule your appointment.   If you are experiencing a Mental Health or Behavioral Health Crisis or need someone to talk to, please call the Suicide and Crisis Lifeline: 988 go to [facility] [address], [place] [phone]) call 911  Patient verbalizes understanding of instructions and care plan provided today and agrees to view in MyChart. Active MyChart status and patient understanding of how to access instructions and care plan via MyChart confirmed with patient.     Jeanie Cooks, PhD [facility], [facility] Social Worker Direct Dial: [phone]  Fax: [phone]

## 2023-05-26 NOTE — Patient Outreach (Signed)
 Care Coordination   Follow Up Visit Note   05/26/2023 Name: COURTNI BALASH MRN: [id number] DOB: [birth year]  [name] is a 63 y.o. year old female who sees Doreene Eland, MD for primary care. I spoke with  [name] by phone today.  What matters to the patients health and wellness today?  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 SW will email some resources and have her added to the [employer] serve.  SW completed the SDOH and no other concerns SW will follow up 05/19/2023 at 11:15 am        SDOH assessments and interventions completed:  Yes     Care Coordination Interventions:  Yes, provided  Interventions Today    Flowsheet Row Most Recent Value  General Interventions   General Interventions Discussed/Reviewed General Interventions Reviewed, [employer] received the resources]        Follow up plan: No further intervention required.   Encounter Outcome:  Patient Visit Completed   Jeanie Cooks, PhD [facility], [facility] Social Worker Direct Dial: [phone]  Fax: [phone]

## 2023-05-27 ENCOUNTER — Ambulatory Visit: Payer: Medicare Other | Admitting: Pulmonary Disease

## 2023-05-27 ENCOUNTER — Ambulatory Visit (INDEPENDENT_AMBULATORY_CARE_PROVIDER_SITE_OTHER): Admitting: Pulmonary Disease

## 2023-05-27 ENCOUNTER — Ambulatory Visit (INDEPENDENT_AMBULATORY_CARE_PROVIDER_SITE_OTHER): Admitting: Pharmacist

## 2023-05-27 ENCOUNTER — Encounter: Payer: Self-pay | Admitting: Pulmonary Disease

## 2023-05-27 ENCOUNTER — Ambulatory Visit: Payer: Medicare Other

## 2023-05-27 ENCOUNTER — Telehealth: Payer: Self-pay | Admitting: Family Medicine

## 2023-05-27 ENCOUNTER — Encounter: Payer: Self-pay | Admitting: Pharmacist

## 2023-05-27 VITALS — BP 115/68 | HR 72 | Ht 60.0 in | Wt 298.0 lb

## 2023-05-27 VITALS — Wt 298.0 lb

## 2023-05-27 DIAGNOSIS — R739 Hyperglycemia, unspecified: Secondary | ICD-10-CM | POA: Diagnosis not present

## 2023-05-27 DIAGNOSIS — J4521 Mild intermittent asthma with (acute) exacerbation: Secondary | ICD-10-CM | POA: Diagnosis not present

## 2023-05-27 DIAGNOSIS — R7303 Prediabetes: Secondary | ICD-10-CM | POA: Diagnosis not present

## 2023-05-27 DIAGNOSIS — J210 Acute bronchiolitis due to respiratory syncytial virus: Secondary | ICD-10-CM | POA: Diagnosis not present

## 2023-05-27 LAB — HEMOGLOBIN A1C: Hgb A1c MFr Bld: 8 % — ABNORMAL HIGH (ref 4.6–6.5)

## 2023-05-27 MED ORDER — GLIPIZIDE 5 MG PO TABS: 5.0000 mg | ORAL_TABLET | Freq: Two times a day (BID) | ORAL | 0 refills | Status: DC

## 2023-05-27 MED ORDER — PREDNISONE 10 MG PO TABS: ORAL_TABLET | ORAL | 0 refills | Status: AC

## 2023-05-27 NOTE — Progress Notes (Signed)
 Synopsis: Referred in November 2023 for cough and shortness of breath by Janit Pagan, MD  Subjective:   PATIENT ID: Frances Medina GENDER: female DOB: [birth year], MRN: [id number]  HPI  Chief Complaint  Patient presents with   Follow-up    Pt states she been having some cramping in legs and hands   Frances Medina is a 63 year old woman, former smoker with GERD, hypertension, OSA on CPAP, seasonal allergies, positive ANA in 2017, myasthenia gravis and asthma who returns to pulmonary clinic for cough and dyspnea.   She has been experiencing persistent wheezing since February 10, 2023, following an initial diagnosis of coronavirus and a subsequent diagnosis of RSV a couple days ago. The wheezing has not resolved despite treatment and continues to affect her daily life. She is currently on prednisone 40mg  daily. She experiences cramps, which she attributes to either the medication or insufficient water intake. She is also using albuterol, Advair, and steroid breathing treatments.   Since December, she has experienced a significant increase in thirst and urination, with a noted blood sugar level of 345 during a recent ER visit. She has a history of taking metformin for blood sugar management but was advised to stop temporarily. Her hemoglobin A1c in January was 5.9. No issues with blood sugars aside from the noted high level during the ER visit.   OV 08/13/22 Her cough has improved with the advair 115-29mcg 2 puffs twice day. She has been mostly using it as needed. No other complaints at this time.  Initial OV 01/20/22 PFTs in 2018 showed mild restrictive defect and significant bronchodilator response with out evidence of obstruction.   In 2017 she reports being in ICU and had trach placed and was diagnosed with myasthenia gravis. She is currently followed by [facility].   She reports intermittent cough with phlegm production that is clear in color. She does have sinus congestion and post  nasal drainage. She has intermittent wheezing when the cough is bad. She is using as needed albuterol inhaler and nebulizer treatments. She is not using these medications on a weekly basis. She also has headaches. She reports developing a rash on her arms and legs about 1 week ago. She is in a bariatric weight loss program and has lost 45lbs since last year after starting [facility] and bariatric diet.  She is not currently working and has been on disability since her critical illness in 2017. 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 She is the primary care taker for her mother-in-law. She quit smoking 10 years ago and smoked for 37 years, half a pack to 1 pack per day. She has 3 children, boy and 2 girls. Her youngest is being recruited to play basketball at [employer].  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   Past Medical History:  Diagnosis Date   Acute respiratory failure (HCC)    Acute respiratory failure with hypoxemia (HCC)    Aspiration into airway    Back pain 07/31/2017   Benign neoplasm of rectum    CAP (community acquired pneumonia) 08/05/2015   Depression with anxiety    Diverticulosis    Dysphagia 03/2015    EGD, Dr Leone Payor. mild antral gastritis, ? due to Ibuprofen.  no stricture but empirically maloney dilated esophagus.    Dysphagia, neurologic    E. 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/08/2015   GERD (gastroesophageal reflux disease)    hiatal hernia   Hypertension    "went away when I stopped smoking"   Knee injury    Left hip pain 02/18/2017   Migraine    "maybe couple times/month" (03/20/2015)   Mild intermittent asthma 06/22/2017    Myasthenia gravis (HCC) 2017   Myasthenia gravis with acute exacerbation (HCC) 05/15/2015   Osteoarthritis of left knee 12/02/2013   Osteoarthritis of right knee 08/30/2013   Pancreatitis 07/25/2017   Protein-calorie malnutrition, severe (HCC) 08/07/2015   Seasonal allergies    takes Zytrec   Tracheostomy status (HCC)    Tubular adenoma of colon    Tumors    "in my stomach"   Umbilical hernia    watching , no plans for surgery at present   Urine incontinence 10/19/2015   VAP (ventilator-associated pneumonia) (HCC)      Family History  Problem Relation Age of Onset   Heart disease Mother 47   Hypertension Mother    Stroke Father    Hypertension Father    Atopy Sister    Allergic rhinitis Sister    Other Sister        Esophageal strictures   Allergic rhinitis Sister    Anemia Sister    Leukemia Sister    Leukemia Sister    COPD Brother    Colon cancer Neg Hx      Social History   Socioeconomic History   Marital status: Married    Spouse name: Not on file   Number of children: 3   Years of education: Not on file   Highest education level: 12th grade  Occupational History   Occupation: disabled  Tobacco Use   Smoking status: Former    Current packs/day: 0.00    Average packs/day: 1.5 packs/day for 38.2 years (57.3 ttl pk-yrs)    Types: Cigarettes    Start date: 03/03/1974    Quit date: 05/14/2012    Years since quitting: 11.0   Smokeless tobacco: Never   Tobacco comments:    denies thoughts of restarting  Vaping Use   Vaping status: Former  Substance and Sexual Activity   Alcohol use: No    Alcohol/week: 0.0 standard drinks of alcohol   Drug use: No   Sexual activity: Not Currently    Birth control/protection: Surgical  Other Topics Concern   Not on file  Social History Narrative   Work or School: [employer] schools - [employer] Situation: lives with husband and daughters      Spiritual Beliefs: Christian      Lifestyle: no regular exercise;  trying to eat healthy      [facility] (09/11/16):   Originally from [facility] [place] [facility]. Has always lived in [name]. No pets currently. Does have carpet in her home in her bedroom. No bird exposure. Previously had mold under her kitchen sink. No indoor plants. Previously was working in [employer].          Social Drivers of Corporate investment banker Strain: Low Risk  (04/06/2023)   Overall Financial Resource Strain (CARDIA)    Difficulty of Paying Living Expenses: Not hard at all  Food Insecurity: No Food Insecurity (05/01/2023)  Hunger Vital Sign    Worried About Running Out of Food in the Last Year: Never true    Ran Out of Food in the Last Year: Never true  Transportation Needs: No Transportation Needs (05/01/2023)   PRAPARE - Administrator, Civil Service (Medical): No    Lack of Transportation (Non-Medical): No  Physical Activity: Insufficiently Active (04/06/2023)   Exercise Vital Sign    Days of Exercise per Week: 3 days    Minutes of Exercise per Session: 40 min  Stress: No Stress Concern Present (04/06/2023)   [employer] of Occupational Health - Occupational Stress Questionnaire    Feeling of Stress : Not at all  Social Connections: Moderately Integrated (05/01/2023)   Social Connection and Isolation Panel [NHANES]    Frequency of Communication with Friends and Family: More than three times a week    Frequency of Social Gatherings with Friends and Family: More than three times a week    Attends Religious Services: More than 4 times per year    Active Member of [employer] or Organizations: No    Attends Banker Meetings: Never    Marital Status: Married  Catering manager Violence: Not At Risk (05/01/2023)   Humiliation, Afraid, Rape, and Kick questionnaire    Fear of Current or Ex-Partner: No    Emotionally Abused: No    Physically Abused: No    Sexually Abused: No     Allergies  Allergen Reactions   Ciprofloxacin Shortness Of Breath and Rash    Naproxen Shortness Of Breath   Shrimp [Shellfish Allergy] Hives and Shortness Of Breath    ER required   Dicyclomine Hives   Methocarbamol Hives   Pineapple Itching    Itchy lips and tongue   Morphine And Codeine Other (See Comments)    Severe headache     Outpatient Medications Prior to Visit  Medication Sig Dispense Refill   acetaminophen (TYLENOL) 500 MG tablet Take 500 mg by mouth in the morning and at bedtime.     albuterol (PROVENTIL) (2.5 MG/3ML) 0.083% nebulizer solution Take 3 mLs (2.5 mg total) by nebulization every 6 (six) hours as needed for wheezing or shortness of breath. 150 mL 1   albuterol (VENTOLIN HFA) 108 (90 Base) MCG/ACT inhaler INHALE 2 PUFFS INTO THE LUNGS EVERY 6 HOURS AS NEEDED FOR WHEEZE OR SHORTNESS OF BREATH 18 g 3   APPLE CIDER VINEGAR PO Take 1 capsule by mouth daily.     Ascorbic Acid (VITAMIN C PO) Take 1,000 mg by mouth daily.     baclofen (LIORESAL) 10 MG tablet TAKE 1 TABLET BY MOUTH AT BEDTIME AS NEEDED FOR MUSCLE SPASMS. 90 tablet 1   betamethasone dipropionate 0.05 % cream Apply topically 2 (two) times daily. 45 g 0   clonazePAM (KLONOPIN) 0.5 MG tablet TAKE 1 TABLET BY MOUTH TWICE A DAY AS NEEDED FOR ANXIETY 60 tablet 1   Cyanocobalamin (VITAMIN B12 PO) Take 1 tablet by mouth daily.     EPINEPHrine 0.3 mg/0.3 mL IJ SOAJ injection Inject 0.3 mg into the muscle as needed for anaphylaxis. 2 each 1   famotidine (PEPCID) 20 MG tablet Take 1 tablet (20 mg total) by mouth 2 (two) times daily as needed (hives). 60 tablet 5   fexofenadine (ALLEGRA) 180 MG tablet Take 1 tablet (180 mg total) by mouth 2 (two) times daily. 90 tablet 1   fluticasone (FLONASE) 50 MCG/ACT nasal spray SPRAY 2 SPRAYS INTO EACH NOSTRIL EVERY DAY 48  mL 2   fluticasone-salmeterol (WIXELA INHUB) 250-50 MCG/ACT AEPB Inhale 1 puff into the lungs in the morning and at bedtime. 60 each 5   gabapentin (NEURONTIN) 300 MG capsule TAKE 1 CAPSULE BY MOUTH TWICE A DAY 60 capsule 1   Menthol 10 MG  LOZG Take 1 lozenge by mouth daily as needed (cough).     metFORMIN (GLUCOPHAGE-XR) 500 MG 24 hr tablet Take 500 mg by mouth.     montelukast (SINGULAIR) 10 MG tablet TAKE 1 TABLET BY MOUTH EVERYDAY AT BEDTIME 90 tablet 1   mycophenolate (CELLCEPT) 500 MG tablet Take 500 mg by mouth 2 (two) times daily.     nystatin (MYCOSTATIN/NYSTOP) powder APPLY TOPICALLY 3 TIMES DAILY AS NEEDED. 15 g 1   omeprazole (PRILOSEC) 20 MG capsule TAKE 1 CAPSULE BY MOUTH EVERY DAY AS NEEDED 90 capsule 1   phentermine (ADIPEX-P) 37.5 MG tablet Take 18.75 mg by mouth every morning.     predniSONE (DELTASONE) 10 MG tablet Take 4 tablets (40 mg total) by mouth daily for 5 days. 20 tablet 0   sertraline (ZOLOFT) 50 MG tablet TAKE 3 TABLETS BY MOUTH EVERY DAY 270 tablet 1   VITAMIN D, CHOLECALCIFEROL, PO Take 1 tablet by mouth daily. otc     No facility-administered medications prior to visit.   Review of Systems  Constitutional:  Negative for chills, fever, malaise/fatigue and weight loss.  HENT:  Negative for congestion, sinus pain and sore throat.   Eyes: Negative.   Respiratory:  Positive for cough, shortness of breath (with exertion) and wheezing. Negative for hemoptysis and sputum production.   Cardiovascular:  Negative for chest pain, palpitations, orthopnea, claudication and leg swelling.  Gastrointestinal:  Negative for abdominal pain, heartburn, nausea and vomiting.  Genitourinary: Negative.   Musculoskeletal:  Positive for joint pain. Negative for myalgias.  Skin:  Negative for rash.  Neurological:  Negative for weakness.  Psychiatric/Behavioral:  Positive for depression. The patient is nervous/anxious.    Objective:   Vitals:   05/27/23 0959  BP: 115/68  Pulse: 72  SpO2: 100%  Weight: 298 lb (135.2 kg)  Height: 5' (1.524 m)   Physical Exam Constitutional:      General: She is not in acute distress.    Appearance: She is obese. She is not ill-appearing.  HENT:     Head: Normocephalic and  atraumatic.  Eyes:     General: No scleral icterus.    Conjunctiva/sclera: Conjunctivae normal.  Cardiovascular:     Rate and Rhythm: Normal rate and regular rhythm.     Pulses: Normal pulses.     Heart sounds: Normal heart sounds. No murmur heard. Pulmonary:     Effort: Pulmonary effort is normal.     Breath sounds: Wheezing present. No rhonchi or rales.  Musculoskeletal:     Right lower leg: No edema.     Left lower leg: No edema.  Skin:    General: Skin is warm and dry.  Neurological:     General: No focal deficit present.     Mental Status: She is alert.    CBC    Component Value Date/Time   WBC 11.9 (H) 05/25/2023 0720   RBC 5.11 05/25/2023 0720   HGB 19.0 (H) 05/25/2023 0734   HGB 11.5 05/06/2023 1041   HGB 12.4 10/30/2015 1112   HCT 56.0 (H) 05/25/2023 0734   HCT 36.9 05/06/2023 1041   HCT 38.9 10/30/2015 1112   PLT 211 05/25/2023 0720   PLT 159  05/06/2023 1041   MCV 73.2 (L) 05/25/2023 0720   MCV 71 (L) 05/06/2023 1041   MCV 72.2 (L) 10/30/2015 1112   MCH 22.3 (L) 05/25/2023 0720   MCHC 30.5 05/25/2023 0720   RDW 19.6 (H) 05/25/2023 0720   RDW 16.9 (H) 05/06/2023 1041   RDW 19.0 (H) 10/30/2015 1112   LYMPHSABS 2.4 05/25/2023 0720   LYMPHSABS 1.6 05/06/2023 1041   LYMPHSABS 1.0 10/30/2015 1112   MONOABS 0.5 05/25/2023 0720   MONOABS 0.4 10/30/2015 1112   EOSABS 0.0 05/25/2023 0720   EOSABS 0.0 05/06/2023 1041   BASOSABS 0.0 05/25/2023 0720   BASOSABS 0.0 05/06/2023 1041   BASOSABS 0.0 10/30/2015 1112      Latest Ref Rng & Units 05/25/2023    7:34 AM 05/25/2023    7:20 AM 05/06/2023   10:41 AM  BMP  Glucose 70 - 99 mg/dL  161  85   BUN 8 - 23 mg/dL  19  11   Creatinine 0.96 - 1.00 mg/dL  0.45  4.09   BUN/Creat Ratio 12 - 28   16   Sodium 135 - 145 mmol/L 137  138  144   Potassium 3.5 - 5.1 mmol/L 3.9  3.8  4.1   Chloride 98 - 111 mmol/L  102  106   CO2 22 - 32 mmol/L  25  25   Calcium 8.9 - 10.3 mg/dL  9.2  9.5    Chest imaging: CXR  05/25/23 Normal heart size and mediastinal contours. No pleural fluid, interstitial edema or airspace disease. Visualized osseous structures appear intact.  CTA Chest 05/03/20 Cardiovascular: Satisfactory pulmonary artery opacification but there is bolus dispersion, artifact from body habitus, and multiple areas of streak artifact. No visible pulmonary embolism. Normal heart size. No pericardial effusion. Negative aorta.   Mediastinum/Nodes: Negative for adenopathy or mass.   Lungs/Pleura: Band of atelectasis at the bases, subsegmental. There is no edema, consolidation, effusion, or pneumothorax.   Upper Abdomen: No acute finding.   Musculoskeletal: Spondylosis with bridging osteophytes in the lower thoracic spine.  PFT:    Latest Ref Rng & Units 10/27/2016    9:36 AM  PFT Results  FVC-Pre L 1.57   FVC-Predicted Pre % 66   FVC-Post L 1.66   FVC-Predicted Post % 70   Pre FEV1/FVC % % 83   Post FEV1/FCV % % 92   FEV1-Pre L 1.30   FEV1-Predicted Pre % 70   FEV1-Post L 1.52   DLCO uncorrected ml/min/mmHg 15.92   DLCO UNC% % 84   DLCO corrected ml/min/mmHg 15.64   DLCO COR %Predicted % 82   DLVA Predicted % 131   TLC L 3.49   TLC % Predicted % 78   RV % Predicted % 100     Labs:  Path:  Echo:  Heart Catheterization:  Sleep Study CPAP Titration 2021 Date of NPSG, Split Night or HST:   NPSG 11/19/15  AHI 10/ hr, desatturation to 86%, body weight 264 lbs    IMPRESSIONS - The optimal PAP pressure was 12 cm of water. - Central sleep apnea was not noted during this titration (CAI = 0.2/h). - Significant oxygen desaturations were not observed during this titration (min O2 = 90.0%). - No snoring was audible during this study. - No cardiac abnormalities were observed during this study. - Clinically significant periodic limb movements were not noted during this study. Arousals associated with PLMs were rare.  Assessment & Plan:   Mild intermittent asthma with acute  exacerbation  RSV (acute bronchiolitis due to respiratory syncytial virus) - Plan: predniSONE (DELTASONE) 10 MG tablet  Hyperglycemia - Plan: Hemoglobin A1c, Hemoglobin A1c  Discussion: Frances Medina is a 63 year old woman, former smoker with GERD, hypertension, OSA on CPAP, seasonal allergies, positive ANA in 2017, myasthenia gravis and asthma who returns to pulmonary clinic for cough and dyspnea.   Asthma with acute exacerbation Respiratory Syncytial Virus (RSV) infection On going respiratory symptoms due to recent RSV infection - Continue prednisone taper: 40 mg for 5 days, then taper to 30 mg, 20 mg, and 10 mg every two days. - Continue albuterol and Advair as needed. - Monitor symptoms and report if wheezing persists after steroid course.  Hyperglycemia Blood sugar elevated at 345 during ER visit. Steroid use since December likely contributing to hyperglycemia. Increased thirst and urination noted. Discussed potential hospitalization if uncontrolled. - Check blood sugar levels with glucometer at home. - Order lab tests to monitor blood sugar levels. - Coordinate with family medicine for potential insulin therapy if needed.  Follow up in 3 months, call sooner if needed.  Melody Comas, MD [facility] Office: [phone]   Current Outpatient Medications:    acetaminophen (TYLENOL) 500 MG tablet, Take 500 mg by mouth in the morning and at bedtime., Disp: , Rfl:    albuterol (PROVENTIL) (2.5 MG/3ML) 0.083% nebulizer solution, Take 3 mLs (2.5 mg total) by nebulization every 6 (six) hours as needed for wheezing or shortness of breath., Disp: 150 mL, Rfl: 1   albuterol (VENTOLIN HFA) 108 (90 Base) MCG/ACT inhaler, INHALE 2 PUFFS INTO THE LUNGS EVERY 6 HOURS AS NEEDED FOR WHEEZE OR SHORTNESS OF BREATH, Disp: 18 g, Rfl: 3   APPLE CIDER VINEGAR PO, Take 1 capsule by mouth daily., Disp: , Rfl:    Ascorbic Acid (VITAMIN C PO), Take 1,000 mg by mouth daily., Disp: ,  Rfl:    baclofen (LIORESAL) 10 MG tablet, TAKE 1 TABLET BY MOUTH AT BEDTIME AS NEEDED FOR MUSCLE SPASMS., Disp: 90 tablet, Rfl: 1   betamethasone dipropionate 0.05 % cream, Apply topically 2 (two) times daily., Disp: 45 g, Rfl: 0   clonazePAM (KLONOPIN) 0.5 MG tablet, TAKE 1 TABLET BY MOUTH TWICE A DAY AS NEEDED FOR ANXIETY, Disp: 60 tablet, Rfl: 1   Cyanocobalamin (VITAMIN B12 PO), Take 1 tablet by mouth daily., Disp: , Rfl:    EPINEPHrine 0.3 mg/0.3 mL IJ SOAJ injection, Inject 0.3 mg into the muscle as needed for anaphylaxis., Disp: 2 each, Rfl: 1   famotidine (PEPCID) 20 MG tablet, Take 1 tablet (20 mg total) by mouth 2 (two) times daily as needed (hives)., Disp: 60 tablet, Rfl: 5   fexofenadine (ALLEGRA) 180 MG tablet, Take 1 tablet (180 mg total) by mouth 2 (two) times daily., Disp: 90 tablet, Rfl: 1   fluticasone (FLONASE) 50 MCG/ACT nasal spray, SPRAY 2 SPRAYS INTO EACH NOSTRIL EVERY DAY, Disp: 48 mL, Rfl: 2   fluticasone-salmeterol (WIXELA INHUB) 250-50 MCG/ACT AEPB, Inhale 1 puff into the lungs in the morning and at bedtime., Disp: 60 each, Rfl: 5   gabapentin (NEURONTIN) 300 MG capsule, TAKE 1 CAPSULE BY MOUTH TWICE A DAY, Disp: 60 capsule, Rfl: 1   Menthol 10 MG LOZG, Take 1 lozenge by mouth daily as needed (cough)., Disp: , Rfl:    metFORMIN (GLUCOPHAGE-XR) 500 MG 24 hr tablet, Take 500 mg by mouth., Disp: , Rfl:    montelukast (SINGULAIR) 10 MG tablet, TAKE 1 TABLET BY MOUTH  EVERYDAY AT BEDTIME, Disp: 90 tablet, Rfl: 1   mycophenolate (CELLCEPT) 500 MG tablet, Take 500 mg by mouth 2 (two) times daily., Disp: , Rfl:    nystatin (MYCOSTATIN/NYSTOP) powder, APPLY TOPICALLY 3 TIMES DAILY AS NEEDED., Disp: 15 g, Rfl: 1   omeprazole (PRILOSEC) 20 MG capsule, TAKE 1 CAPSULE BY MOUTH EVERY DAY AS NEEDED, Disp: 90 capsule, Rfl: 1   phentermine (ADIPEX-P) 37.5 MG tablet, Take 18.75 mg by mouth every morning., Disp: , Rfl:    predniSONE (DELTASONE) 10 MG tablet, Take 4 tablets (40 mg total) by  mouth daily for 5 days., Disp: 20 tablet, Rfl: 0   predniSONE (DELTASONE) 10 MG tablet, Take 3 tablets (30 mg total) by mouth daily with breakfast for 2 days, THEN 2 tablets (20 mg total) daily with breakfast for 2 days, THEN 1 tablet (10 mg total) daily with breakfast for 2 days., Disp: 20 tablet, Rfl: 0   sertraline (ZOLOFT) 50 MG tablet, TAKE 3 TABLETS BY MOUTH EVERY DAY, Disp: 270 tablet, Rfl: 1   VITAMIN D, CHOLECALCIFEROL, PO, Take 1 tablet by mouth daily. otc, Disp: , Rfl:    glipiZIDE (GLUCOTROL) 5 MG tablet, Take 1 tablet (5 mg total) by mouth 2 (two) times daily before a meal. Take 1 tablet by mouth twice daily, Disp: 20 tablet, Rfl: 0

## 2023-05-27 NOTE — Progress Notes (Deleted)
 Synopsis: Referred in November 2023 for cough and shortness of breath by Janit Pagan, MD  Subjective:   PATIENT ID: Frances Medina GENDER: female DOB: 02-Jul-1960, MRN: [id number]  HPI  No chief complaint on file.  Frances Medina is a 63 year old woman, former smoker with GERD, hypertension, OSA on CPAP, seasonal allergies, positive ANA in 2017, myasthenia gravis and asthma who returns to pulmonary clinic for cough and dyspnea.   Her cough has improved with the advair 115-110mcg 2 puffs twice day. She has been mostly using it as needed. No other complaints at this time.  Initial OV 01/20/22 PFTs in 2018 showed mild restrictive defect and significant bronchodilator response with out evidence of obstruction.   In 2017 she reports being in ICU and had trach placed and was diagnosed with myasthenia gravis. She is currently followed by [facility].   She reports intermittent cough with phlegm production that is clear in color. She does have sinus congestion and post nasal drainage. She has intermittent wheezing when the cough is bad. She is using as needed albuterol inhaler and nebulizer treatments. She is not using these medications on a weekly basis. She also has headaches. She reports developing a rash on her arms and legs about 1 week ago. She is in a bariatric weight loss program and has lost 45lbs since last year after starting [facility] and bariatric diet.  She is not currently working and has been on disability since her critical illness in 2017. 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 She is the primary care taker for her mother-in-law. She quit smoking 10 years ago and smoked for 37 years, half a pack to 1 pack per day. She has 3 children, boy and 2 girls. Her youngest is being recruited to play basketball at [employer].  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   Past Medical History:  Diagnosis Date   Acute respiratory  failure (HCC)    Acute respiratory failure with hypoxemia (HCC)    Aspiration into airway    Back pain 07/31/2017   Benign neoplasm of rectum    CAP (community acquired pneumonia) 08/05/2015   Depression with anxiety    Diverticulosis    Dysphagia 03/2015    EGD, Dr Leone Payor. mild antral gastritis, ? due to Ibuprofen.  no stricture but empirically maloney dilated esophagus.    Dysphagia, neurologic    E. 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   GERD (gastroesophageal reflux disease)    hiatal hernia   Hypertension    "went away when I stopped smoking"   Knee injury    Left hip pain 02/18/2017   Migraine    "maybe couple times/month" (03/20/2015)   Mild intermittent asthma 06/22/2017   Myasthenia gravis (HCC) 2017   Myasthenia gravis with acute exacerbation (HCC) 05/15/2015   Osteoarthritis of left knee 12/02/2013   Osteoarthritis of right knee 08/30/2013   Pancreatitis 07/25/2017   Protein-calorie malnutrition, severe (HCC) 08/07/2015   Seasonal allergies    takes Zytrec   Tracheostomy status (HCC)    Tubular adenoma of colon    Tumors    "  in my stomach"   Umbilical hernia    watching , no plans for surgery at present   Urine incontinence 10/19/2015   VAP (ventilator-associated pneumonia) (HCC)      Family History  Problem Relation Age of Onset   Heart disease Mother 66   Hypertension Mother    Stroke Father    Hypertension Father    Atopy Sister    Allergic rhinitis Sister    Other Sister        Esophageal strictures   Allergic rhinitis Sister    Anemia Sister    Leukemia Sister    Leukemia Sister    COPD Brother    Colon cancer Neg Hx      Social History   Socioeconomic History    Marital status: Married    Spouse name: Not on file   Number of children: 3   Years of education: Not on file   Highest education level: 12th grade  Occupational History   Occupation: disabled  Tobacco Use   Smoking status: Former    Current packs/day: 0.00    Average packs/day: 1.5 packs/day for 38.2 years (57.3 ttl pk-yrs)    Types: Cigarettes    Start date: 03/03/1974    Quit date: 05/14/2012    Years since quitting: 11.0   Smokeless tobacco: Never   Tobacco comments:    denies thoughts of restarting  Vaping Use   Vaping status: Former  Substance and Sexual Activity   Alcohol use: No    Alcohol/week: 0.0 standard drinks of alcohol   Drug use: No   Sexual activity: Not Currently    Birth control/protection: Surgical  Other Topics Concern   Not on file  Social History Narrative   Work or School: [employer] schools - [employer] Situation: lives with husband and daughters      Spiritual Beliefs: Christian      Lifestyle: no regular exercise; trying to eat healthy      [facility] (09/11/16):   Originally from [facility]. Has always lived in [name]. No pets currently. Does have carpet in her home in her bedroom. No bird exposure. Previously had mold under her kitchen sink. No indoor plants. Previously was working in [employer].          Social Drivers of Corporate investment banker Strain: Low Risk  (04/06/2023)   Overall Financial Resource Strain (CARDIA)    Difficulty of Paying Living Expenses: Not hard at all  Food Insecurity: No Food Insecurity (05/01/2023)   Hunger Vital Sign    Worried About Running Out of Food in the Last Year: Never true    Ran Out of Food in the Last Year: Never true  Transportation Needs: No Transportation Needs (05/01/2023)   PRAPARE - Administrator, Civil Service (Medical): No    Lack of Transportation (Non-Medical): No  Physical Activity: Insufficiently Active (04/06/2023)   Exercise Vital Sign    Days of  Exercise per Week: 3 days    Minutes of Exercise per Session: 40 min  Stress: No Stress Concern Present (04/06/2023)   [employer] of Occupational Health - Occupational Stress Questionnaire    Feeling of Stress : Not at all  Social Connections: Moderately Integrated (05/01/2023)   Social Connection and Isolation Panel [NHANES]    Frequency of Communication with Friends and Family: More than three times a week    Frequency of Social Gatherings with Friends and Family: More  than three times a week    Attends Religious Services: More than 4 times per year    Active Member of Clubs or Organizations: No    Attends Banker Meetings: Never    Marital Status: Married  Catering manager Violence: Not At Risk (05/01/2023)   Humiliation, Afraid, Rape, and Kick questionnaire    Fear of Current or Ex-Partner: No    Emotionally Abused: No    Physically Abused: No    Sexually Abused: No     Allergies  Allergen Reactions   Ciprofloxacin Shortness Of Breath and Rash   Naproxen Shortness Of Breath   Shrimp [Shellfish Allergy] Hives and Shortness Of Breath    ER required   Dicyclomine Hives   Methocarbamol Hives   Pineapple Itching    Itchy lips and tongue   Morphine And Codeine Other (See Comments)    Severe headache     Outpatient Medications Prior to Visit  Medication Sig Dispense Refill   acetaminophen (TYLENOL) 500 MG tablet Take 500 mg by mouth in the morning and at bedtime.     albuterol (PROVENTIL) (2.5 MG/3ML) 0.083% nebulizer solution Take 3 mLs (2.5 mg total) by nebulization every 6 (six) hours as needed for wheezing or shortness of breath. 150 mL 1   albuterol (VENTOLIN HFA) 108 (90 Base) MCG/ACT inhaler INHALE 2 PUFFS INTO THE LUNGS EVERY 6 HOURS AS NEEDED FOR WHEEZE OR SHORTNESS OF BREATH 18 g 3   APPLE CIDER VINEGAR PO Take 1 capsule by mouth daily.     Ascorbic Acid (VITAMIN C PO) Take 1,000 mg by mouth daily.     baclofen (LIORESAL) 10 MG tablet TAKE 1 TABLET BY  MOUTH AT BEDTIME AS NEEDED FOR MUSCLE SPASMS. 90 tablet 1   betamethasone dipropionate 0.05 % cream Apply topically 2 (two) times daily. 45 g 0   clonazePAM (KLONOPIN) 0.5 MG tablet TAKE 1 TABLET BY MOUTH TWICE A DAY AS NEEDED FOR ANXIETY 60 tablet 1   Cyanocobalamin (VITAMIN B12 PO) Take 1 tablet by mouth daily.     EPINEPHrine 0.3 mg/0.3 mL IJ SOAJ injection Inject 0.3 mg into the muscle as needed for anaphylaxis. 2 each 1   famotidine (PEPCID) 20 MG tablet Take 1 tablet (20 mg total) by mouth 2 (two) times daily as needed (hives). 60 tablet 5   fexofenadine (ALLEGRA) 180 MG tablet Take 1 tablet (180 mg total) by mouth 2 (two) times daily. 90 tablet 1   fluticasone (FLONASE) 50 MCG/ACT nasal spray SPRAY 2 SPRAYS INTO EACH NOSTRIL EVERY DAY 48 mL 2   fluticasone-salmeterol (WIXELA INHUB) 250-50 MCG/ACT AEPB Inhale 1 puff into the lungs in the morning and at bedtime. 60 each 5   gabapentin (NEURONTIN) 300 MG capsule TAKE 1 CAPSULE BY MOUTH TWICE A DAY (Patient not taking: Reported on 05/06/2023) 60 capsule 1   Menthol 10 MG LOZG Take 1 lozenge by mouth daily as needed (cough).     metFORMIN (GLUCOPHAGE-XR) 500 MG 24 hr tablet Take 500 mg by mouth. (Patient not taking: Reported on 05/06/2023)     montelukast (SINGULAIR) 10 MG tablet TAKE 1 TABLET BY MOUTH EVERYDAY AT BEDTIME 90 tablet 1   mycophenolate (CELLCEPT) 500 MG tablet Take 500 mg by mouth 2 (two) times daily.     nystatin (MYCOSTATIN/NYSTOP) powder APPLY TOPICALLY 3 TIMES DAILY AS NEEDED. (Patient not taking: Reported on 05/06/2023) 15 g 1   omeprazole (PRILOSEC) 20 MG capsule TAKE 1 CAPSULE BY MOUTH EVERY DAY AS  NEEDED 90 capsule 1   phentermine (ADIPEX-P) 37.5 MG tablet Take 18.75 mg by mouth every morning.     predniSONE (DELTASONE) 10 MG tablet Take 4 tablets (40 mg total) by mouth daily for 5 days. 20 tablet 0   sertraline (ZOLOFT) 50 MG tablet TAKE 3 TABLETS BY MOUTH EVERY DAY 270 tablet 1   VITAMIN D, CHOLECALCIFEROL, PO Take 1 tablet by  mouth daily. otc     No facility-administered medications prior to visit.    Review of Systems  Constitutional:  Negative for chills, fever, malaise/fatigue and weight loss.  HENT:  Negative for congestion, sinus pain and sore throat.   Eyes: Negative.   Respiratory:  Positive for shortness of breath (with exertion). Negative for cough, hemoptysis, sputum production and wheezing.   Cardiovascular:  Negative for chest pain, palpitations, orthopnea, claudication and leg swelling.  Gastrointestinal:  Negative for abdominal pain, heartburn, nausea and vomiting.  Genitourinary: Negative.   Musculoskeletal:  Positive for joint pain. Negative for myalgias.  Skin:  Negative for rash.  Neurological:  Negative for weakness.  Psychiatric/Behavioral:  Positive for depression. The patient is nervous/anxious.    Objective:   There were no vitals filed for this visit.  Physical Exam Constitutional:      General: She is not in acute distress.    Appearance: She is obese. She is not ill-appearing.  HENT:     Head: Normocephalic and atraumatic.  Eyes:     General: No scleral icterus.    Conjunctiva/sclera: Conjunctivae normal.  Cardiovascular:     Rate and Rhythm: Normal rate and regular rhythm.     Pulses: Normal pulses.     Heart sounds: Normal heart sounds. No murmur heard. Pulmonary:     Effort: Pulmonary effort is normal.     Breath sounds: Normal breath sounds. No wheezing, rhonchi or rales.  Musculoskeletal:     Right lower leg: No edema.     Left lower leg: No edema.  Skin:    General: Skin is warm and dry.  Neurological:     General: No focal deficit present.     Mental Status: She is alert.    CBC    Component Value Date/Time   WBC 11.9 (H) 05/25/2023 0720   RBC 5.11 05/25/2023 0720   HGB 19.0 (H) 05/25/2023 0734   HGB 11.5 05/06/2023 1041   HGB 12.4 10/30/2015 1112   HCT 56.0 (H) 05/25/2023 0734   HCT 36.9 05/06/2023 1041   HCT 38.9 10/30/2015 1112   PLT 211  05/25/2023 0720   PLT 159 05/06/2023 1041   MCV 73.2 (L) 05/25/2023 0720   MCV 71 (L) 05/06/2023 1041   MCV 72.2 (L) 10/30/2015 1112   MCH 22.3 (L) 05/25/2023 0720   MCHC 30.5 05/25/2023 0720   RDW 19.6 (H) 05/25/2023 0720   RDW 16.9 (H) 05/06/2023 1041   RDW 19.0 (H) 10/30/2015 1112   LYMPHSABS 2.4 05/25/2023 0720   LYMPHSABS 1.6 05/06/2023 1041   LYMPHSABS 1.0 10/30/2015 1112   MONOABS 0.5 05/25/2023 0720   MONOABS 0.4 10/30/2015 1112   EOSABS 0.0 05/25/2023 0720   EOSABS 0.0 05/06/2023 1041   BASOSABS 0.0 05/25/2023 0720   BASOSABS 0.0 05/06/2023 1041   BASOSABS 0.0 10/30/2015 1112      Latest Ref Rng & Units 05/25/2023    7:34 AM 05/25/2023    7:20 AM 05/06/2023   10:41 AM  BMP  Glucose 70 - 99 mg/dL  324  85  BUN 8 - 23 mg/dL  19  11   Creatinine 8.29 - 1.00 mg/dL  5.62  1.30   BUN/Creat Ratio 12 - 28   16   Sodium 135 - 145 mmol/L 137  138  144   Potassium 3.5 - 5.1 mmol/L 3.9  3.8  4.1   Chloride 98 - 111 mmol/L  102  106   CO2 22 - 32 mmol/L  25  25   Calcium 8.9 - 10.3 mg/dL  9.2  9.5    Chest imaging: CTA Chest 05/03/20 Cardiovascular: Satisfactory pulmonary artery opacification but there is bolus dispersion, artifact from body habitus, and multiple areas of streak artifact. No visible pulmonary embolism. Normal heart size. No pericardial effusion. Negative aorta.   Mediastinum/Nodes: Negative for adenopathy or mass.   Lungs/Pleura: Band of atelectasis at the bases, subsegmental. There is no edema, consolidation, effusion, or pneumothorax.   Upper Abdomen: No acute finding.   Musculoskeletal: Spondylosis with bridging osteophytes in the lower thoracic spine.  PFT:    Latest Ref Rng & Units 10/27/2016    9:36 AM  PFT Results  FVC-Pre L 1.57   FVC-Predicted Pre % 66   FVC-Post L 1.66   FVC-Predicted Post % 70   Pre FEV1/FVC % % 83   Post FEV1/FCV % % 92   FEV1-Pre L 1.30   FEV1-Predicted Pre % 70   FEV1-Post L 1.52   DLCO uncorrected ml/min/mmHg  15.92   DLCO UNC% % 84   DLCO corrected ml/min/mmHg 15.64   DLCO COR %Predicted % 82   DLVA Predicted % 131   TLC L 3.49   TLC % Predicted % 78   RV % Predicted % 100     Labs:  Path:  Echo:  Heart Catheterization:  Sleep Study CPAP Titration 2021 Date of NPSG, Split Night or HST:   NPSG 11/19/15  AHI 10/ hr, desatturation to 86%, body weight 264 lbs    IMPRESSIONS - The optimal PAP pressure was 12 cm of water. - Central sleep apnea was not noted during this titration (CAI = 0.2/h). - Significant oxygen desaturations were not observed during this titration (min O2 = 90.0%). - No snoring was audible during this study. - No cardiac abnormalities were observed during this study. - Clinically significant periodic limb movements were not noted during this study. Arousals associated with PLMs were rare.  Assessment & Plan:   No diagnosis found.  Discussion: Frances Medina is a 63 year old woman, former smoker with GERD, hypertension, OSA on CPAP, seasonal allergies, positive ANA in 2017, myasthenia gravis and asthma who returns to pulmonary clinic for cough and dyspnea.   Her history is consistent with mild intermittent asthma with improvement in her symptoms with ICS/LABA inhaler therapy.   She is to continue advair HFA 115-28mcg 2 puffs twice daily as needed.  HRCT Chest was denied by insurance. We will hold off on further testing as her breathing is improved at this time.  Follow up in 1 year.  Melody Comas, MD [facility] Office: [phone]   Current Outpatient Medications:    acetaminophen (TYLENOL) 500 MG tablet, Take 500 mg by mouth in the morning and at bedtime., Disp: , Rfl:    albuterol (PROVENTIL) (2.5 MG/3ML) 0.083% nebulizer solution, Take 3 mLs (2.5 mg total) by nebulization every 6 (six) hours as needed for wheezing or shortness of breath., Disp: 150 mL, Rfl: 1   albuterol (VENTOLIN HFA) 108 (90 Base) MCG/ACT inhaler,  INHALE 2 PUFFS  INTO THE LUNGS EVERY 6 HOURS AS NEEDED FOR WHEEZE OR SHORTNESS OF BREATH, Disp: 18 g, Rfl: 3   APPLE CIDER VINEGAR PO, Take 1 capsule by mouth daily., Disp: , Rfl:    Ascorbic Acid (VITAMIN C PO), Take 1,000 mg by mouth daily., Disp: , Rfl:    baclofen (LIORESAL) 10 MG tablet, TAKE 1 TABLET BY MOUTH AT BEDTIME AS NEEDED FOR MUSCLE SPASMS., Disp: 90 tablet, Rfl: 1   betamethasone dipropionate 0.05 % cream, Apply topically 2 (two) times daily., Disp: 45 g, Rfl: 0   clonazePAM (KLONOPIN) 0.5 MG tablet, TAKE 1 TABLET BY MOUTH TWICE A DAY AS NEEDED FOR ANXIETY, Disp: 60 tablet, Rfl: 1   Cyanocobalamin (VITAMIN B12 PO), Take 1 tablet by mouth daily., Disp: , Rfl:    EPINEPHrine 0.3 mg/0.3 mL IJ SOAJ injection, Inject 0.3 mg into the muscle as needed for anaphylaxis., Disp: 2 each, Rfl: 1   famotidine (PEPCID) 20 MG tablet, Take 1 tablet (20 mg total) by mouth 2 (two) times daily as needed (hives)., Disp: 60 tablet, Rfl: 5   fexofenadine (ALLEGRA) 180 MG tablet, Take 1 tablet (180 mg total) by mouth 2 (two) times daily., Disp: 90 tablet, Rfl: 1   fluticasone (FLONASE) 50 MCG/ACT nasal spray, SPRAY 2 SPRAYS INTO EACH NOSTRIL EVERY DAY, Disp: 48 mL, Rfl: 2   fluticasone-salmeterol (WIXELA INHUB) 250-50 MCG/ACT AEPB, Inhale 1 puff into the lungs in the morning and at bedtime., Disp: 60 each, Rfl: 5   gabapentin (NEURONTIN) 300 MG capsule, TAKE 1 CAPSULE BY MOUTH TWICE A DAY (Patient not taking: Reported on 05/06/2023), Disp: 60 capsule, Rfl: 1   Menthol 10 MG LOZG, Take 1 lozenge by mouth daily as needed (cough)., Disp: , Rfl:    metFORMIN (GLUCOPHAGE-XR) 500 MG 24 hr tablet, Take 500 mg by mouth. (Patient not taking: Reported on 05/06/2023), Disp: , Rfl:    montelukast (SINGULAIR) 10 MG tablet, TAKE 1 TABLET BY MOUTH EVERYDAY AT BEDTIME, Disp: 90 tablet, Rfl: 1   mycophenolate (CELLCEPT) 500 MG tablet, Take 500 mg by mouth 2 (two) times daily., Disp: , Rfl:    nystatin (MYCOSTATIN/NYSTOP) powder, APPLY TOPICALLY  3 TIMES DAILY AS NEEDED. (Patient not taking: Reported on 05/06/2023), Disp: 15 g, Rfl: 1   omeprazole (PRILOSEC) 20 MG capsule, TAKE 1 CAPSULE BY MOUTH EVERY DAY AS NEEDED, Disp: 90 capsule, Rfl: 1   phentermine (ADIPEX-P) 37.5 MG tablet, Take 18.75 mg by mouth every morning., Disp: , Rfl:    predniSONE (DELTASONE) 10 MG tablet, Take 4 tablets (40 mg total) by mouth daily for 5 days., Disp: 20 tablet, Rfl: 0   sertraline (ZOLOFT) 50 MG tablet, TAKE 3 TABLETS BY MOUTH EVERY DAY, Disp: 270 tablet, Rfl: 1   VITAMIN D, CHOLECALCIFEROL, PO, Take 1 tablet by mouth daily. otc, Disp: , Rfl:

## 2023-05-27 NOTE — Patient Instructions (Addendum)
 Start taking glipizide 5 mg twice daily  It was a pleasure seeing you today!  The sensor is small waterproof disc that is placed on the back of the upper arm.  There is a very thin filament that is inserted under the surface of the skin and measures the amount of glucose in the interstitial fluid.  This system collects your sugar levels for up to 15 days and it automatically records the glucose level every 15 minutes. This will show your provider any patterns in your glucose levels.  Please remember... 1. Sensor will last 15 days 2. Sensor should be applied to area away from scarring, tattoos, irritation, and bones. 3. Starting the sensor: 1 hour warm up before BG readings available   4. Scan the sensor at least every 8 hours for the FreeStyle Libre 2. Libre 3 does not require scanning.  5. Hold reader within 1.5 inches of sensor to scan 6. When the blood drop and magnifying glass symbol appears, test fingerstick blood glucose prior to making treatment decisions 7. Do a fingerstick blood glucose test if the sensor readings do not match how you feel 8. Remove sensor prior to magnetic resonance imaging (MRI), computed tomography (CT) scan, or high-frequency electrical heat (diathermy) treatment. 9. Freestyle Libre may be worn through a Industrial/product designer. It may not be exposed to an advanced Imaging Technology (AIT) body scanner (also called a millimeter wave scanner) or the baggage x-ray machine. Instead, ask for hand-wanding or full-body pat-down and visual inspection.  10. Doses of vitamin C (ascorbic acid) >500 mg every day may cause false high readings. 11. Do not submerge more than 3 feet or keep underwater longer than 30 minutes at a time. Gently pat to dry.  12. Store sensor kit between 39 and 77 degrees Farenheit. Can be refrigerated within this temperature range.  Problems with Freestyle Libre sticking? 1. Order Tegaderm I.V. films to place directly over [facility] sensor  on arm. 2. May also order Skin Tac from [facility]. Alcohol swab area you plan to administer Freestyle Libre then let dry. Once dry, apply Skin Tac in a circular motion (with a spot in the middle for sensor without skin tac) and let dry. Once dry you can apply Freestyle Libre   Problems taking off Freestyle [place]? 1. Remember to try to shower before removing Freestyle Libre 2. Order Tac Away to help remove any extra adhesive left on your skin once you remove Freestyle Libre 3. May also try baby oil to loosen adhesive  Freestyle [facility] Phone number: [phone] Available 7 days a week; excluding holidays 8 AM to 8PM EST  [email or web address]   Hypoglycemia or low blood sugar:   Low blood sugar can happen quickly and may become an emergency if not treated right away.   While this shouldn't happen often, it can be brought upon if you skip a meal or do not eat enough. Also, if your insulin or other diabetes medications are dosed too high, this can cause your blood sugar to go to low.   Warning signs of low blood sugar include: Feeling shaky or dizzy Feeling weak or tired  Excessive hunger Feeling anxious or upset  Sweating even when you aren't exercising  What to do if I experience low blood sugar? Follow the Rule of 15 Check your blood sugar. If lower than 70, proceed to step 2.  Treat with 15 grams of fast acting carbs which is found in 3-4 glucose tablets. If none are  available you can try hard candy, 1 tablespoon of sugar or honey,4 ounces of fruit juice, or 6 ounces of REGULAR soda.  Re-check your sugar in 15 minutes. If it is still below 70, do what you did in step 2 again. If your blood sugar has come back up, go ahead and eat a snack or small meal made up of complex carbs (ex. Whole grains) and protein at this time to avoid recurrence of low blood sugar.

## 2023-05-27 NOTE — Assessment & Plan Note (Signed)
 Hyperglycemia in patient with pre-diabetes, secondary to steroid burst.  CGM placed today in setting of patient receiving steroid burst for asthma exacerbation.  Libre sensor placed and counseled on use.  Connect with [facility] to allow for glucose review remotely over the next 10-15 days.   Glipizide 5mg  BID during use of steroid burst.  Patient educated on purpose, proper use and potential adverse effects of glipizide.   Following instruction patient verbalized understanding of treatment plan.

## 2023-05-27 NOTE — Patient Instructions (Addendum)
 Continue on 40mg  daily of prednisone from the ER.  Then take 30mg  daily x 2 days 20mg  daily x 2 days 10mg  daily x 2 days  Continue advair inhaler 1 puff twice daily - rinse mouth out after each use  Continue albuterol inhaler as needed  Continue montelukast daily  Your family medicine team is making you an appointment tomorrow  Follow up in 3 months, call sooner if needed

## 2023-05-27 NOTE — Progress Notes (Signed)
    S:     Chief Complaint  Patient presents with   Medication Management    CGM   63 y.o. female who presents for education, and management of hyperglyemia with user of CGM. Patient arrives in good spirits and presents with a cain.  Patient was referred and talked with Dr.Brown  PMH is significant for Pre-diabetes, and current asthma exacerbation treated with steroids.  Dr. Manson Passey agreed with plan to start the patient on glipizide with her steroid taper.  CGM - Libre 3+placed on left arm today - Majority of visit focused on CGM education and initiation.   O:   Review of Systems  Respiratory:  Positive for shortness of breath.   All other systems reviewed and are negative.  Physical Exam Vitals reviewed.  Psychiatric:        Mood and Affect: Mood normal.        Thought Content: Thought content normal.        Judgment: Judgment normal.    Lab Results  Component Value Date   HGBA1C 5.9 03/13/2023   Lipid Panel     Component Value Date/Time   CHOL 187 03/16/2023 1055   TRIG 110 03/16/2023 1055   HDL 46 03/16/2023 1055   CHOLHDL 4.1 03/16/2023 1055   CHOLHDL 2.7 07/25/2017 1846   VLDL 9 07/25/2017 1846   LDLCALC 121 (H) 03/16/2023 1055    Clinical Atherosclerotic Cardiovascular Disease (ASCVD): No  The 10-year ASCVD risk score (Arnett DK, et al., 2019) is: 4.7%   Values used to calculate the score:     Age: 68 years     Sex: Female     Is Non-Hispanic African American: Yes     Diabetic: No     Tobacco smoker: No     Systolic Blood Pressure: 115 mmHg     Is BP treated: No     HDL Cholesterol: 46 mg/dL     Total Cholesterol: 187 mg/dL    A/P: Hyperglycemia in patient with pre-diabetes, secondary to steroid burst.  CGM placed today in setting of patient receiving steroid burst for asthma exacerbation.  Libre sensor placed and counseled on use.  Connect with [facility] to allow for glucose review remotely over the next 10-15 days.   Glipizide 5mg  BID during  use of steroid burst.  Patient educated on purpose, proper use and potential adverse effects of glipizide.   Following instruction patient verbalized understanding of treatment plan.    Written patient instructions provided. Patient verbalized understanding of treatment plan.  Total time in face to face counseling 14 minutes.    Follow-up:  PCP clinic visit in 06/02/2023 Patient seen with Mack Guise, PharmD Candidate.

## 2023-05-27 NOTE — Progress Notes (Signed)
 Reviewed and agree with Dr Macky Lower plan.

## 2023-05-27 NOTE — Telephone Encounter (Signed)
 Called patient about elevated glucose in ER. Dr. Francine Graven messaged--appreciate his excellent care. Scheduled with Dr. Raymondo Band for possible CGM and likely short acting glipizide for a few days while on long acting steroid.

## 2023-05-28 NOTE — Progress Notes (Signed)
 Reviewed and agree with Dr Macky Lower plan.

## 2023-05-29 ENCOUNTER — Telehealth: Payer: Self-pay | Admitting: Pharmacist

## 2023-05-29 ENCOUNTER — Telehealth: Payer: Self-pay | Admitting: *Deleted

## 2023-05-29 DIAGNOSIS — R7303 Prediabetes: Secondary | ICD-10-CM

## 2023-05-29 MED ORDER — GLIPIZIDE 5 MG PO TABS: 2.5000 mg | ORAL_TABLET | Freq: Every day | ORAL | Status: DC

## 2023-05-29 NOTE — Telephone Encounter (Signed)
 Reviewed CGM post steroid elevation of glucose readings earlier in the week.   Patient contacted for follow-up of what appeared to be a low reading ~ 1:00 PM today.  This reading has returned to normal range in the last two hours.  She reported her first dose of glipizide was earlier today.   We discussed her treatment course.  She still has several days of steroid burst remaining.   I advised her to take 1/2 tablet of Glipizide once daily in the AM each day.  She verbalized understanding of treatment plan.    Total time with patient call and documentation of interaction: 12 minutes.

## 2023-05-29 NOTE — Progress Notes (Signed)
 Complex Care Management Care Guide Note  05/29/2023 Name: Frances Medina MRN: [id number] DOB: 09/16/1960  Frances Medina is a 63 y.o. year old female who is a primary care patient of Doreene Eland, MD and is actively engaged with the care management team. I reached out to Frances Medina by phone today to assist with re-scheduling  with the RN Case Manager.  Follow up plan: Unsuccessful telephone outreach attempt made. A HIPAA compliant phone message was left for the patient providing contact information and requesting a return call.  Gwenevere Ghazi  [facility]  [facility], [facility] Guide  Direct Dial: [phone]  Fax [phone]

## 2023-05-29 NOTE — Telephone Encounter (Signed)
-----   Message from Westley Chandler sent at 05/27/2023  8:18 PM EDT ----- Thank you so much Dr. Raymondo Band!! ----- Message ----- From: Kathrin Ruddy, RPH-CPP Sent: 05/27/2023   1:11 PM EDT To: Leighton Roach McDiarmid, MD; Westley Chandler, MD

## 2023-06-01 NOTE — Telephone Encounter (Signed)
 Reviewed and agree with Dr Macky Lower plan.

## 2023-06-02 ENCOUNTER — Ambulatory Visit (INDEPENDENT_AMBULATORY_CARE_PROVIDER_SITE_OTHER): Admitting: Family Medicine

## 2023-06-02 ENCOUNTER — Encounter: Payer: Self-pay | Admitting: Family Medicine

## 2023-06-02 VITALS — BP 138/81 | HR 92 | Ht 60.0 in | Wt 299.4 lb

## 2023-06-02 DIAGNOSIS — Z7985 Long-term (current) use of injectable non-insulin antidiabetic drugs: Secondary | ICD-10-CM

## 2023-06-02 DIAGNOSIS — J984 Other disorders of lung: Secondary | ICD-10-CM

## 2023-06-02 DIAGNOSIS — E119 Type 2 diabetes mellitus without complications: Secondary | ICD-10-CM | POA: Insufficient documentation

## 2023-06-02 DIAGNOSIS — R7303 Prediabetes: Secondary | ICD-10-CM

## 2023-06-02 HISTORY — DX: Type 2 diabetes mellitus without complications: E11.9

## 2023-06-02 LAB — GLUCOSE, POCT (MANUAL RESULT ENTRY): POC Glucose: 92 mg/dL (ref 70–99)

## 2023-06-02 MED ORDER — GLIPIZIDE 5 MG PO TABS
2.5000 mg | ORAL_TABLET | Freq: Every day | ORAL | Status: DC
Start: 2023-06-02 — End: 2023-07-06

## 2023-06-02 MED ORDER — TIRZEPATIDE 2.5 MG/0.5ML ~~LOC~~ SOAJ: 2.5000 mg | SUBCUTANEOUS | 1 refills | Status: DC

## 2023-06-02 NOTE — Assessment & Plan Note (Addendum)
 Recent exacerbation with RSV pulm illness Now, back at her baseline ?? some elements of mild asthma She is not compliant with her Wixella Medication management counseling was provided F/U with Pulm and allergist as planned.

## 2023-06-02 NOTE — Progress Notes (Signed)
    SUBJECTIVE:   CHIEF COMPLAINT / HPI:   DM2: Here for follow-up. She was placed on Glipizide 5 mg, which was recently reduced to 2.5 mg QD. Her CBG values range from low 60s to high 200. She did not take her oral steroid today and wants to stop taking it altogether. She has a few more tablets of her prednisone to complete.   Restrictive lung dx/SOB: The patient was recently seen at the ED for RSV respiratory infection. She denies any cough or wheezing. Her SOB is back to baseline and improved. She uses albuterol and Wixella as needed. She does not wish to continue her tapering dose of prednisone.  Based on the prescription report, she should be done with medication today or tomorrow. She is adamant about d/cing her steroids at this point due to her intermittent hyperglycemia. Her CGM also fell off, but she was able to place it on today  Grief: Her cousin got killed recently, and she was informed this morning. She stated that she is coping well by reading the bible.   PERTINENT  PMH / PSH: PMHx reviewed  OBJECTIVE:   BP 138/81   Pulse 92   Ht 5' (1.524 m)   Wt 299 lb 6.4 oz (135.8 kg)   SpO2 98%   BMI 58.47 kg/m   Physical Exam Vitals and nursing note reviewed.  Constitutional:      Appearance: She is obese.  Cardiovascular:     Rate and Rhythm: Normal rate and regular rhythm.     Heart sounds: Normal heart sounds. No murmur heard. Pulmonary:     Effort: Pulmonary effort is normal. No respiratory distress.     Breath sounds: Normal breath sounds. No wheezing.  Neurological:     Mental Status: She is alert and oriented to person, place, and time.  Psychiatric:        Mood and Affect: Mood normal. Mood is not anxious.        Behavior: Behavior normal. Behavior is not agitated.      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 Francine Graven. Continue Glipizide to prevent hypoglycemia while on steroid D/C Metformin, not taking regularly Will start Mounjaro to help with weight loss as well As discussed with her, she should d/c Glipizide when starting Mounjaro She will keep monitoring her CGM  F/U in pharmacy clinic tomorrow as scheduled Restrictive lung disease Recent exacerbation with RSV pulm illness Now, back at her baseline ?? some elements of mild asthma She is not compliant with her Wixella Medication management counseling was provided F/U with Pulm and allergist as planned.     Janit Pagan, MD [facility]

## 2023-06-02 NOTE — Assessment & Plan Note (Addendum)
 DM with hyperglycemia - steroid-induced Exacerbated by recent steroid use CBG: 300+ and A1C of 8 She self d/ced oral prednisone today However, she is still within the papering schedule, hence encouraged to complete taper as recommended by Dr. Francine Graven. Continue Glipizide to prevent hypoglycemia while on steroid D/C Metformin, not taking regularly Will start Mounjaro to help with weight loss as well As discussed with her, she should d/c Glipizide when starting Mounjaro She will keep monitoring her CGM  F/U in pharmacy clinic tomorrow as scheduled

## 2023-06-02 NOTE — Patient Instructions (Signed)
 Tirzepatide Injection What is this medication? TIRZEPATIDE (tir ZEP a tide) treats type 2 diabetes. It works by increasing insulin levels in your body, which decreases your blood sugar (glucose). It also reduces the amount of sugar released into your blood and slows down your digestion. Changes to diet and exercise are often combined with this medication. This medicine may be used for other purposes; ask your health care provider or pharmacist if you have questions. COMMON BRAND NAME(S): MOUNJARO What should I tell my care team before I take this medication? They need to know if you have any of these conditions: Eye disease caused by diabetes Gallbladder disease Have or have had pancreatitis Having surgery Kidney disease Personal or family history of MEN 2, a condition that causes endocrine gland tumors Personal or family history of thyroid cancer Stomach or intestine problems, such as problems digesting food An unusual or allergic reaction to tirzepatide, other medications, foods, dyes, or preservatives Pregnant or trying to get pregnant Breastfeeding How should I use this medication? This medication is injected under the skin. You will be taught how to prepare and give it. It is given once every week (every 7 days). Keep taking it unless your health care provider tells you to stop. If you use this medication with insulin, you should inject this medication and the insulin separately. Do not mix them together. Do not give the injections right next to each other. Change (rotate) injection sites with each injection. This medication comes with INSTRUCTIONS FOR USE. Ask your pharmacist for directions on how to use this medication. Read the information carefully. Talk to your pharmacist or care team if you have questions. It is important that you put your used needles and syringes in a special sharps container. Do not put them in a trash can. If you do not have a sharps container, call your  pharmacist or care team to get one. A special MedGuide will be given to you by the pharmacist with each prescription and refill. Be sure to read this information carefully each time. Talk to your care team about the use of this medication in children. Special care may be needed. Overdosage: If you think you have taken too much of this medicine contact a poison control center or emergency room at once. NOTE: This medicine is only for you. Do not share this medicine with others. What if I miss a dose? If you miss a dose, take it as soon as you can unless it is more than 4 days (96 hours) late. If it is more than 4 days late, skip the missed dose. Take the next dose at the normal time. Do not take 2 doses within 3 days of each other. What may interact with this medication? Alcohol Antiviral medications for HIV or AIDS Aspirin and aspirin-like medications Beta blockers, such as atenolol, metoprolol, propranolol Certain medications for blood pressure, heart disease, irregular heart beat Chromium Clonidine Diuretics Estrogen or progestin hormones Fenofibrate Gemfibrozil Guanethidine Isoniazid Lanreotide Female hormones or anabolic steroids MAOIs, such as Marplan, Nardil, and Parnate Medications for weight loss Medications for allergies, asthma, cold, or cough Medications for depression, anxiety, or mental health conditions Niacin Nicotine NSAIDs, medications for pain and inflammation, such as ibuprofen or naproxen Octreotide Other medications for diabetes, such as glyburide, glipizide, or glimepiride Pasireotide Pentamidine Phenytoin Probenecid Quinolone antibiotics, such as ciprofloxacin, levofloxacin, ofloxacin Reserpine Some herbal dietary supplements Steroid medications, such as prednisone or cortisone Sulfamethoxazole; trimethoprim Thyroid hormones Warfarin This list may not describe all possible  interactions. Give your health care provider a list of all the medicines, herbs,  non-prescription drugs, or dietary supplements you use. Also tell them if you smoke, drink alcohol, or use illegal drugs. Some items may interact with your medicine. What should I watch for while using this medication? Visit your care team for regular checks on your progress. Tell your care team if your symptoms do not start to get better or if they get worse. You may need blood work done while you are taking this medication. Your care team will monitor your HbA1C (A1C). This test shows what your average blood sugar (glucose) level was over the past 2 to 3 months. Know the symptoms of low blood sugar and know how to treat it. Always carry a source of quick sugar with you. Examples include hard sugar candy or glucose tablets. Make sure others know that you can choke if you eat or drink if your blood sugar is too low and you are unable to care for yourself. Get medical help at once. Tell your care team if you have high blood sugar. Your medication dose may change if your body is under stress. Some types of stress that may affect your blood sugar include fever, infection, and surgery. Do not share pens or cartridges with anyone, even if the needle is changed. Each pen should only be used by one person. Sharing could cause an infection. Wear a medical ID bracelet or chain. Carry a card that describes your condition. List the medications and doses you take on the card. Talk to your care team about your risk of cancer. You may be more at risk for certain types of cancer if you take this medication. Talk to your care team right away if you have a lump or swelling in your neck, hoarseness that does not go away, trouble swallowing, shortness of breath, or trouble breathing. Make sure you stay hydrated while taking this medication. Drink water often. Eat fruits and veggies that have a high water content. Drink more water when it is hot or you are active. Talk to your care team right away if you have fever, infection,  vomiting, diarrhea, or if you sweat a lot while taking this medication. The loss of too much body fluid may make it dangerous for you to take this medication. If you are going to need surgery or a procedure, tell your care team that you are taking this medication. Estrogen and progestin hormones that you take by mouth may not work as well while you are taking this medication. Switch to a non-oral contraceptive or add a barrier contraceptive for 4 weeks after starting this medication and after each dose increase. Talk to your care team about contraceptive options. They can help you find the option that works for you. What side effects may I notice from receiving this medication? Side effects that you should report to your care team as soon as possible: Allergic reactions or angioedema--skin rash, itching or hives, swelling of the face, eyes, lips, tongue, arms, or legs, trouble swallowing or breathing Change in vision Dehydration--increased thirst, dry mouth, feeling faint or lightheaded, headache, dark yellow or brown urine Gallbladder problems--severe stomach pain, nausea, vomiting, fever Kidney injury--decrease in the amount of urine, swelling of the ankles, hands, or feet Pancreatitis--severe stomach pain that spreads to your back or gets worse after eating or when touched, fever, nausea, vomiting Thoughts of suicide or self-harm, worsening mood, feelings of depression Thyroid cancer--new mass or lump in the neck,  pain or trouble swallowing, trouble breathing, hoarseness Side effects that usually do not require medical attention (report these to your care team if they continue or are bothersome): Diarrhea Loss of appetite Nausea Upset stomach This list may not describe all possible side effects. Call your doctor for medical advice about side effects. You may report side effects to FDA at 1-800-FDA-1088. Where should I keep my medication? Keep out of the reach of children and  pets. Refrigeration (preferred): Store in the refrigerator. Keep this medication in the original carton until you are ready to take it. Do not freeze. Protect from light. Get rid of opened vials after use, even if there is medication left. Get rid of any unopened vials or pens after the expiration date. Room Temperature: This medication may be stored at room temperature below 30 degrees C (86 degrees F) for up to 21 days. Keep this medication in the original carton until you are ready to take it. Protect from light. Avoid exposure to extreme heat. Get rid of opened vials after use, even if there is medication left. Get rid of any unopened vials or pens after 21 days, or after they expire, whichever is first. To get rid of medications that are no longer needed or have expired: Take the medication to a medication take-back program. Check with your pharmacy or law enforcement to find a location. If you cannot return the medication, ask your pharmacist or care team how to get rid of this medication safely. NOTE: This sheet is a summary. It may not cover all possible information. If you have questions about this medicine, talk to your doctor, pharmacist, or health care provider.  2024 Elsevier/Gold Standard (2023-01-30 00:00:00)

## 2023-06-03 ENCOUNTER — Ambulatory Visit (INDEPENDENT_AMBULATORY_CARE_PROVIDER_SITE_OTHER): Admitting: Pharmacist

## 2023-06-03 ENCOUNTER — Encounter: Payer: Self-pay | Admitting: Pharmacist

## 2023-06-03 ENCOUNTER — Telehealth: Payer: Self-pay

## 2023-06-03 VITALS — BP 127/85 | HR 80 | Wt 302.4 lb

## 2023-06-03 DIAGNOSIS — Z7985 Long-term (current) use of injectable non-insulin antidiabetic drugs: Secondary | ICD-10-CM | POA: Diagnosis not present

## 2023-06-03 DIAGNOSIS — J984 Other disorders of lung: Secondary | ICD-10-CM | POA: Diagnosis not present

## 2023-06-03 DIAGNOSIS — E119 Type 2 diabetes mellitus without complications: Secondary | ICD-10-CM

## 2023-06-03 LAB — POCT GLYCOSYLATED HEMOGLOBIN (HGB A1C): HbA1c, POC (controlled diabetic range): 7.8 % — AB (ref 0.0–7.0)

## 2023-06-03 MED ORDER — FREESTYLE LIBRE 3 PLUS SENSOR MISC
11 refills | Status: AC
Start: 2023-06-03 — End: ?

## 2023-06-03 NOTE — Progress Notes (Signed)
 S:     Chief Complaint  Patient presents with   Medication Management    Diabetes f/u - CGM   63 y.o. female who presents for diabetes evaluation, education, and management. Patient arrives in good spirits and presents with a cane.   Patient was referred and last seen by Primary Care Provider, Dr. Lum Babe, on 06/02/23.   PMH is significant for pre-diabetes, and current asthma exacerbation treated with prednisone.  At last visit, patient was initiated on [facility]. Although patient has not yet picked it up at the pharmacy.   Current diabetes medications include: glipizide 5 mg, Mounjaro (tirzepatide) 2.5 mg/0.5 mL (not yet started)  Patient reports adherence to taking all medications as prescribed. Patient reports not taking gabapentin while on prednisone.  Do you feel that your medications are working for you? yes Have you been experiencing any side effects to the medications prescribed? no Do you have any problems obtaining medications due to transportation or finances? no Insurance coverage: [employer], Medicare Part A and B  Patient reported dietary habits: Eating mainly meat and vegetables. Has reduced carbohydrate intake since [place] sensor was applied. Drinks: orange juice (drinking when glucose levels are low), water  O:   Review of Systems  All other systems reviewed and are negative.   Physical Exam Constitutional:      Appearance: Normal appearance.  Pulmonary:     Effort: Pulmonary effort is normal.  Neurological:     Mental Status: She is alert.  Psychiatric:        Mood and Affect: Mood normal.        Behavior: Behavior normal.        Thought Content: Thought content normal.        Judgment: Judgment normal.    Lab Results  Component Value Date   HGBA1C 7.8 (A) 06/03/2023   Vitals:   06/03/23 0855  BP: 127/85  Pulse: 80  SpO2: 100%    Lipid Panel     Component Value Date/Time   CHOL 187 03/16/2023 1055   TRIG 110 03/16/2023 1055   HDL 46  03/16/2023 1055   CHOLHDL 4.1 03/16/2023 1055   CHOLHDL 2.7 07/25/2017 1846   VLDL 9 07/25/2017 1846   LDLCALC 121 (H) 03/16/2023 1055    Clinical Atherosclerotic Cardiovascular Disease (ASCVD): No  The 10-year ASCVD risk score (Arnett DK, et al., 2019) is: 14.1%   Values used to calculate the score:     Age: 46 years     Sex: Female     Is Non-Hispanic African American: Yes     Diabetic: Yes     Tobacco smoker: No     Systolic Blood Pressure: 127 mmHg     Is BP treated: No     HDL Cholesterol: 46 mg/dL     Total Cholesterol: 187 mg/dL   A/P: Recently diagnosed diabetes with recent steroid burst causing elevated glucose readings greater than 250mg /dl.  Last week, she was managed with CGM - libre and glipizide was added during her steroid burst.  She was evaluated today for potential enrollment in CGM study. Unfortunately, A1C of 7.8 did not qualify her for enrollment in [employer] (LIbre 3) study.  Patient desires to continue using CGM.  She is able to verbalize appropriate hypoglycemia management plan. Medication adherence appears good.  -Plan to initiated GLP-1 Mounjaro (tirzepatide) 2.5 mg/0.5 mL weekly - prescribed yesterday - PA pending.  -Continued glipizide 2.5 mg once daily in AM (taking 0.5 of 5mg  tablet)  daily until [facility] initiated -Patient educated on purpose, proper use, and potential adverse effects.  -Extensively discussed pathophysiology of diabetes, recommended lifestyle interventions, dietary effects on blood sugar control.  She plans to continue use of Libre 3 CGM as it has helped her with making better food choices.  She is aware that insurance is unlikely to cover but is willing to pay cost following our discussion.  Prescription for Libre 3+ sensors provided.   -Counseled on s/sx of and management of hypoglycemia.   Restrictive lung dx/SOB Patient recently hospitalized for RSV respiratory infection. Shortness of breath has greatly improved since hospitalization.  Patient has been taking Wixella as needed, mostly taking once daily.  -Continued Wixela (fluticasone/salmeterol) - counseled on adherence - encouraged to take twice daily  -She verbalized understanding that routine use of her inhaler should help reduce oral steroid use.   Written patient instructions provided. Patient verbalized understanding of treatment plan.  Total time in face to face counseling 32 minutes.    Follow-up:  Pharmacist PRN PCP clinic visit in [place] Patient seen with Threasa Heads, PharmD Candidate and Darolyn Rua, PharmD Candidate.

## 2023-06-03 NOTE — Assessment & Plan Note (Signed)
 Restrictive lung dx/SOB Patient recently hospitalized for RSV respiratory infection. Shortness of breath has greatly improved since hospitalization. Patient has been taking Wixella as needed, mostly taking once daily.  -Continued Wixela (fluticasone/salmeterol) - counseled on adherence - encouraged to take twice daily  -She verbalized understanding that routine use of her inhaler should help reduce oral steroid use.

## 2023-06-03 NOTE — Patient Instructions (Signed)
 It was nice to see you today!  Your goal blood sugar is 80-130 before eating and less than 180 after eating.  Medication Changes: Continue all other medication the same.   Keep up the good work with diet and exercise. Aim for a diet full of vegetables, fruit and lean meats (chicken, [place], fish). Try to limit salt intake by eating fresh or frozen vegetables (instead of canned), rinse canned vegetables prior to cooking and do not add any additional salt to meals.

## 2023-06-03 NOTE — Telephone Encounter (Signed)
 Pharmacy Patient Advocate Encounter   Received notification from CoverMyMeds that prior authorization for [facility] is required/requested.   Insurance verification completed.   The patient is insured through [facility] .   PA required; PA submitted to above mentioned insurance via CoverMyMeds Key/confirmation #/EOC [id number]. Status is pending

## 2023-06-03 NOTE — Assessment & Plan Note (Signed)
 Recently diagnosed diabetes with recent steroid burst causing elevated glucose readings greater than 250mg /dl.  Last week, she was managed with CGM - libre and glipizide was added during her steroid burst.  She was evaluated today for potential enrollment in CGM study. Unfortunately, A1C of 7.8 did not qualify her for enrollment in Liberate (LIbre 3) study.  Patient desires to continue using CGM.  She is able to verbalize appropriate hypoglycemia management plan. Medication adherence appears good.  -Plan to initiated GLP-1 Mounjaro (tirzepatide) 2.5 mg/0.5 mL weekly - prescribed yesterday - PA pending.  -Continued glipizide 2.5 mg once daily in AM (taking 0.5 of 5mg  tablet) daily until Mounjaro initiated -Patient educated on purpose, proper use, and potential adverse effects.  -Extensively discussed pathophysiology of diabetes, recommended lifestyle interventions, dietary effects on blood sugar control.  -Counseled on s/sx of and management of hypoglycemia.

## 2023-06-04 ENCOUNTER — Encounter: Payer: Self-pay | Admitting: Family Medicine

## 2023-06-04 NOTE — Telephone Encounter (Signed)
 Pharmacy Patient Advocate Encounter  Received notification from [facility] that Prior Authorization for [facility] has been APPROVED from 06/03/23 to 06/03/26

## 2023-06-04 NOTE — Progress Notes (Signed)
 Reviewed and agree with Dr Macky Lower plan.

## 2023-06-09 NOTE — Progress Notes (Signed)
 Complex Care Management Care Guide Note  06/09/2023 Name: Frances Medina MRN: [id number] DOB: [birth year]  Frances Medina is a 63 y.o. year old female who is a primary care patient of Doreene Eland, MD and is actively engaged with the care management team. I reached out to Frances Medina by phone today to assist with re-scheduling  with the RN Case Manager.  Follow up plan: Unsuccessful telephone outreach attempt made. No further outreach attempts will be made at this time. We have been unable to contact the patient to reschedule for complex care management services.  Gwenevere Ghazi  [facility]  [facility], [facility] Guide  Direct Dial: [phone]  Fax [phone]

## 2023-06-09 NOTE — Progress Notes (Unsigned)
 06/10/2023 Frances Medina [id number] 04-14-1960  Referring provider: Doreene Eland, MD Primary GI doctor: Dr. Leonides Schanz (Dr. Russella Dar)  ASSESSMENT AND PLAN:   GERD with dysphagia in the last week, with solids, not liquids, has been on steroids Some pain with swallowing, some bumps on her tongue during steroids On omeprazole 20 mg daily with continuing GERD 07/2019 endoscopy normal esophagus, erythematous mucosa gastric antrum, erythematous mucosa anterior gastric body, postsurgical deformity of gastric body normal duodenum Mild chronic gastritis without H. pylori or metaplasia - Increase omeprazole to 40 mg once daily. - empiric treatment with Diflucan: 2 pills on day 1, then 1 pill daily for 10 days for high risk yeast esophagitis with steroids, steroids inhaler, diabetes acute onset swallowing issues in last week - Order barium swallow to evaluate for esophageal stricture. - Consider endoscopy at hospital due to BMI if barium swallow shows stricture. - follow up 2 months  Personal history of colon polyps Colonoscopy 07/2019 2 polyps 5 to 7 mm descending colon Hyperplastic polyps recall 7 years Recall 07/2026  Microcytic anemia thrombocytopenia 3 years ago 2 months ago 05/25/2023  HGB 11.4 MCV 73.2 Platelets 211 03/16/2023 Iron 40 Ferritin 69 B12 629 Will recheck iron, possible from chronic disease Recent Labs    03/16/23 1055 05/06/23 1041 05/25/23 0720 05/25/23 0734  HGB 12.0 11.5 11.4* 19.0*   Morbid obesity was unable to complete Roux-en-Y due to hepatomegaly 2022 Body mass index is 60.42 kg/m.  -Patient has been advised to make an attempt to improve diet and exercise patterns to aid in weight loss. 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  Must exclude other chronic causes of hepatocellular inflammation that can mimic fatty liver on ultrasound - Labs to include: hepatitis panel, iron, ferritin, TIBC,  IgG,  Antismooth muscle antibody, AMA, celiac, thyroid, -will get elastography - need LFTs and CBC monitored every 6 months, revaluation every 2-3 years.  -Continue to work on risk factor modification including diet exercise and control of risk factors including blood sugars.  Type 2 diabetes A1c 8 on 05/27/2023 On CGM  OSA on CPAP  Myasthenia gravis In remission  Patient Care Team: Doreene Eland, MD as PCP - General (Family Medicine) Parke Poisson, MD as PCP - Cardiology (Cardiology) Linna Darner, RD as Dietitian (Family Medicine)  HISTORY OF PRESENT ILLNESS: 63 y.o. female with a past medical history listed below presents for evaluation of dysphagia and GERD.   Previously known to Dr. Russella Dar, last seen in the office 2021 by Hyacinth Meeker, PA for reflux and history of polyps.  Discussed the use of AI scribe software for clinical note transcription with the patient, who gave verbal consent to proceed.  History of Present Illness   Frances Medina is a 63 year old female with gastroesophageal reflux disease who presents with dysphagia and reflux symptoms.  She has experienced recent onset dysphagia, characterized by food getting 'stuck' and requiring multiple swallows to pass, particularly with solid foods. This has been occurring for about a week, accompanied by discomfort and pain when swallowing. She frequently drinks fluids to aid swallowing. No mouth sores or oral thrush are reported, although she had bumps on the  back of her tongue while on steroids.  She has a history of gastroesophageal reflux disease and is currently taking omeprazole 20 mg daily, which she takes 30 minutes  to an hour before breakfast. Despite this, she continues to experience heartburn and reflux symptoms, particularly with certain foods like [place] fries and orange juice, which exacerbate her symptoms. She uses ginger ale to help with burping and alleviate some symptoms.  She has a history of an enlarged liver, noted during a previous attempt at gastric bypass surgery, which was not completed. Her liver was last imaged in 2013, showing fatty liver and hepatomegaly. She has experienced some abdominal pain in the right upper quadrant.  She has been on high-dose steroids recently due to COVID-19 and RSV, which led to weight gain. She started [facility] on Monday to aid with weight management. She experiences swelling in her legs, particularly the left, and has been attending physical therapy to manage this.  She uses a continuous glucose monitor to manage her diabetes and aims to keep her blood sugar between 70 and 250 mg/dL. She reports shortness of breath and has been using a steroid inhaler for about two months, but does not consistently rinse her mouth after use. No chest pain is reported.  She has a history of colon polyps, with the last colonoscopy in 2021, and is due for another in 2028. She also has a history of anemia, but her iron levels were normal in January. She is not on any iron supplements.      She  reports that she quit smoking about 11 years ago. Her smoking use included cigarettes. She started smoking about 49 years ago. She has a 57.3 pack-year smoking history. She has never used smokeless tobacco. She reports that she does not drink alcohol and does not use drugs.  RELEVANT GI HISTORY, IMAGING AND LABS: Results   LABS Iron: Normal PLT: Low  RADIOLOGY Liver ultrasound: Fatty liver, hepatomegaly  DIAGNOSTIC Endoscopy: Inflammation, negative H. pylori, no malignant cells      CBC    Component Value Date/Time   WBC 11.9 (H) 05/25/2023 0720   RBC 5.11 05/25/2023 0720   HGB  19.0 (H) 05/25/2023 0734   HGB 11.5 05/06/2023 1041   HGB 12.4 10/30/2015 1112   HCT 56.0 (H) 05/25/2023 0734   HCT 36.9 05/06/2023 1041   HCT 38.9 10/30/2015 1112   PLT 211 05/25/2023 0720   PLT 159 05/06/2023 1041   MCV 73.2 (L) 05/25/2023 0720   MCV 71 (L) 05/06/2023 1041   MCV 72.2 (L) 10/30/2015 1112   MCH 22.3 (L) 05/25/2023 0720   MCHC 30.5 05/25/2023 0720   RDW 19.6 (H) 05/25/2023 0720   RDW 16.9 (H) 05/06/2023 1041   RDW 19.0 (H) 10/30/2015 1112   LYMPHSABS 2.4 05/25/2023 0720   LYMPHSABS 1.6 05/06/2023 1041   LYMPHSABS 1.0 10/30/2015 1112   MONOABS 0.5 05/25/2023 0720   MONOABS 0.4 10/30/2015 1112   EOSABS 0.0 05/25/2023 0720   EOSABS 0.0 05/06/2023 1041   BASOSABS 0.0 05/25/2023 0720   BASOSABS 0.0 05/06/2023 1041   BASOSABS 0.0 10/30/2015 1112   Recent Labs    03/16/23 1055 05/06/23 1041 05/25/23 0720 05/25/23 0734  HGB 12.0 11.5 11.4* 19.0*    CMP     Component Value Date/Time   NA 137 05/25/2023 0734   NA 144 05/06/2023 1041   NA 141 10/30/2015 1112   K 3.9 05/25/2023 0734   K 4.2 10/30/2015 1112   CL  102 05/25/2023 0720   CO2 25 05/25/2023 0720   CO2 26 10/30/2015 1112   GLUCOSE 345 (H) 05/25/2023 0720   GLUCOSE 94 10/30/2015 1112   BUN 19 05/25/2023 0720   BUN 11 05/06/2023 1041   BUN 21.6 10/30/2015 1112   CREATININE 0.77 05/25/2023 0720   CREATININE 0.72 11/07/2015 0911   CREATININE 0.7 10/30/2015 1112   CALCIUM 9.2 05/25/2023 0720   CALCIUM 9.9 10/30/2015 1112   PROT 6.9 05/06/2023 1041   PROT 7.1 10/30/2015 1112   ALBUMIN 4.4 05/06/2023 1041   ALBUMIN 3.4 (L) 10/30/2015 1112   AST 14 05/06/2023 1041   AST 13 10/30/2015 1112   ALT 12 05/06/2023 1041   ALT 16 10/30/2015 1112   ALKPHOS 118 05/06/2023 1041   ALKPHOS 80 10/30/2015 1112   BILITOT 0.2 05/06/2023 1041   BILITOT 0.40 10/30/2015 1112   GFRNONAA >60 05/25/2023 0720   GFRNONAA >89 11/07/2015 0911   GFRAA 109 08/26/2019 1132   GFRAA >89 11/07/2015 0911      Latest Ref  Rng & Units 05/06/2023   10:41 AM 05/02/2020   11:19 PM 08/26/2019   11:32 AM  Hepatic Function  Total Protein 6.0 - 8.5 g/dL 6.9  6.7  6.8   Albumin 3.9 - 4.9 g/dL 4.4  3.4  4.3   AST 0 - 40 IU/L 14  18  14    ALT 0 - 32 IU/L 12  14  10    Alk Phosphatase 44 - 121 IU/L 118  75  129   Total Bilirubin 0.0 - 1.2 mg/dL 0.2  0.5  0.2       Current Medications:   Current Outpatient Medications (Endocrine & Metabolic):    glipiZIDE (GLUCOTROL) 5 MG tablet, Take 0.5 tablets (2.5 mg total) by mouth daily before breakfast.   tirzepatide (MOUNJARO) 2.5 MG/0.5ML Pen, Inject 2.5 mg into the skin once a week. (Patient not taking: Reported on 06/03/2023)  Current Outpatient Medications (Cardiovascular):    EPINEPHrine 0.3 mg/0.3 mL IJ SOAJ injection, Inject 0.3 mg into the muscle as needed for anaphylaxis. (Patient not taking: Reported on 06/03/2023)  Current Outpatient Medications (Respiratory):    albuterol (PROVENTIL) (2.5 MG/3ML) 0.083% nebulizer solution, Take 3 mLs (2.5 mg total) by nebulization every 6 (six) hours as needed for wheezing or shortness of breath.   albuterol (VENTOLIN HFA) 108 (90 Base) MCG/ACT inhaler, INHALE 2 PUFFS INTO THE LUNGS EVERY 6 HOURS AS NEEDED FOR WHEEZE OR SHORTNESS OF BREATH   fexofenadine (ALLEGRA) 180 MG tablet, Take 1 tablet (180 mg total) by mouth 2 (two) times daily.   fluticasone (FLONASE) 50 MCG/ACT nasal spray, SPRAY 2 SPRAYS INTO EACH NOSTRIL EVERY DAY   fluticasone-salmeterol (WIXELA INHUB) 250-50 MCG/ACT AEPB, Inhale 1 puff into the lungs in the morning and at bedtime.   montelukast (SINGULAIR) 10 MG tablet, TAKE 1 TABLET BY MOUTH EVERYDAY AT BEDTIME  Current Outpatient Medications (Analgesics):    acetaminophen (TYLENOL) 500 MG tablet, Take 500 mg by mouth in the morning and at bedtime.  Current Outpatient Medications (Hematological):    Cyanocobalamin (VITAMIN B12 PO), Take 1 tablet by mouth daily.  Current Outpatient Medications (Other):    fluconazole  (DIFLUCAN) 200 MG tablet, Take 2 tablets (400 mg total) by mouth daily for 1 day, THEN 1 tablet (200 mg total) daily for 13 days.   pantoprazole (PROTONIX) 40 MG tablet, Take 1 tablet (40 mg total) by mouth daily.   APPLE CIDER VINEGAR PO, Take 1 capsule by  mouth daily.   Ascorbic Acid (VITAMIN C PO), Take 1,000 mg by mouth daily. (Patient not taking: Reported on 06/03/2023)   baclofen (LIORESAL) 10 MG tablet, TAKE 1 TABLET BY MOUTH AT BEDTIME AS NEEDED FOR MUSCLE SPASMS.   betamethasone dipropionate 0.05 % cream, Apply topically 2 (two) times daily.   clonazePAM (KLONOPIN) 0.5 MG tablet, TAKE 1 TABLET BY MOUTH TWICE A DAY AS NEEDED FOR ANXIETY   Continuous Glucose Sensor (FREESTYLE LIBRE 3 PLUS SENSOR) MISC, Change sensor every 15 days.   famotidine (PEPCID) 20 MG tablet, Take 1 tablet (20 mg total) by mouth 2 (two) times daily as needed (hives).   gabapentin (NEURONTIN) 300 MG capsule, TAKE 1 CAPSULE BY MOUTH TWICE A DAY (Patient not taking: Reported on 06/03/2023)   Menthol 10 MG LOZG, Take 1 lozenge by mouth daily as needed (cough).   mycophenolate (CELLCEPT) 500 MG tablet, Take 500 mg by mouth 2 (two) times daily.   nystatin (MYCOSTATIN/NYSTOP) powder, APPLY TOPICALLY 3 TIMES DAILY AS NEEDED.   sertraline (ZOLOFT) 50 MG tablet, TAKE 3 TABLETS BY MOUTH EVERY DAY   VITAMIN D, CHOLECALCIFEROL, PO, Take 1 tablet by mouth daily. otc (Patient not taking: Reported on 06/03/2023)  Medical History:  Past Medical History:  Diagnosis Date   Acute respiratory failure (HCC)    Acute respiratory failure with hypoxemia (HCC)    Aspiration into airway    Back pain 07/31/2017   Benign neoplasm of rectum    CAP (community acquired pneumonia) 08/05/2015   Depression with anxiety    Diabetes mellitus, new onset (HCC) 06/02/2023   Diverticulosis    Dysphagia 03/2015   EGD, Dr Leone Payor. mild antral gastritis, ? due to Ibuprofen.  no stricture but empirically maloney dilated esophagus.    Dysphagia, neurologic     E. 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   GERD (gastroesophageal reflux disease)    hiatal hernia   Hypertension    "went away when I stopped smoking"   Knee injury    Left hip pain 02/18/2017   Migraine    "maybe couple times/month" (03/20/2015)   Mild intermittent asthma 06/22/2017   Myasthenia gravis (HCC) 2017   Myasthenia gravis with acute exacerbation (HCC) 05/15/2015   Osteoarthritis of left knee 12/02/2013   Osteoarthritis of right knee 08/30/2013   Pancreatitis 07/25/2017   Protein-calorie malnutrition, severe (HCC) 08/07/2015   Seasonal allergies    takes Zytrec   Sleep apnea    with cPAp machine   Tracheostomy status (HCC)    Tubular adenoma of colon    Tumors    "in my stomach"   Umbilical hernia    watching , no plans for surgery at present   Urine incontinence 10/19/2015   VAP (ventilator-associated pneumonia) (HCC)    Allergies:  Allergies  Allergen Reactions   Ciprofloxacin Shortness Of Breath and Rash   Naproxen Shortness Of Breath   Shrimp [Shellfish Allergy] Hives and Shortness Of Breath    ER required   Dicyclomine Hives   Methocarbamol Hives   Pineapple Itching    Itchy lips and tongue   Morphine And Codeine Other (See Comments)    Severe headache     Surgical History:  She  has a past surgical history that includes Cesarean section (1987; 1989); Dilation and curettage of uterus; Tubal ligation (1989); Vaginal hysterectomy (1990's?); Partial knee arthroplasty (Right, 08/30/2013); Colonoscopy with propofol (N/A, 09/19/2013); Partial knee arthroplasty (Left, 12/02/2013); Esophagogastroduodenoscopy (egd) with propofol (N/A, 03/21/2015);  Esophagogastroduodenoscopy (N/A, 08/10/2015); PEG placement (N/A, 08/10/2015); Colonoscopy with propofol (N/A, 07/05/2019); Esophagogastroduodenoscopy (egd) with propofol (N/A, 07/05/2019); polypectomy (07/05/2019); and biopsy (07/05/2019). Family History:  Her family history includes Allergic rhinitis in her sister and sister; Anemia in her sister; Atopy in her sister; COPD in her brother; Heart disease (age of onset: 48) in her mother; Hypertension in her father and mother; Leukemia in her sister and sister; Other in her sister; Stroke in her father.  REVIEW OF SYSTEMS  : All other systems reviewed and negative except where noted in the History of Present Illness.  PHYSICAL EXAM: BP 132/76   Pulse 88   Ht 5' (1.524 m)   Wt (!) 309 lb 6 oz (140.3 kg)   BMI 60.42 kg/m  Physical Exam   GENERAL APPEARANCE: morbidly obese, in no apparent distress. HEENT: Oral cavity without white plaques or yeast. No cervical lymphadenopathy, unremarkable thyroid, sclerae anicteric, conjunctiva pink. RESPIRATORY: Respiratory effort normal, lungs clear to auscultation bilaterally without rales, rhonchi, or wheezing. CARDIO: Heart with occasional trigeminy, normal rhythm. Peripheral pulses intact. ABDOMEN: Soft, non-distended, active bowel sounds in all 4 quadrants, mild pain in the right upper quadrant, no rebound, no mass appreciated. RECTAL: Declines. MUSCULOSKELETAL: Full ROM, normal gait, without edema. SKIN: Dry, intact without rashes or lesions. No jaundice. NEURO: Alert, oriented, no focal deficits. PSYCH: Cooperative, normal mood and affect. EXTREMITIES: 1+ edema in legs, no redness or warmth.      Doree Albee, PA-C 9:19 AM

## 2023-06-10 ENCOUNTER — Ambulatory Visit (INDEPENDENT_AMBULATORY_CARE_PROVIDER_SITE_OTHER): Payer: Medicare Other | Admitting: Physician Assistant

## 2023-06-10 ENCOUNTER — Encounter: Payer: Self-pay | Admitting: Physician Assistant

## 2023-06-10 ENCOUNTER — Other Ambulatory Visit (INDEPENDENT_AMBULATORY_CARE_PROVIDER_SITE_OTHER)

## 2023-06-10 VITALS — BP 132/76 | HR 88 | Ht 60.0 in | Wt 309.4 lb

## 2023-06-10 DIAGNOSIS — K760 Fatty (change of) liver, not elsewhere classified: Secondary | ICD-10-CM | POA: Diagnosis not present

## 2023-06-10 DIAGNOSIS — R1319 Other dysphagia: Secondary | ICD-10-CM

## 2023-06-10 DIAGNOSIS — Z6841 Body Mass Index (BMI) 40.0 and over, adult: Secondary | ICD-10-CM

## 2023-06-10 DIAGNOSIS — D509 Iron deficiency anemia, unspecified: Secondary | ICD-10-CM

## 2023-06-10 DIAGNOSIS — Z860102 Personal history of hyperplastic colon polyps: Secondary | ICD-10-CM

## 2023-06-10 DIAGNOSIS — D649 Anemia, unspecified: Secondary | ICD-10-CM | POA: Diagnosis not present

## 2023-06-10 DIAGNOSIS — D696 Thrombocytopenia, unspecified: Secondary | ICD-10-CM

## 2023-06-10 DIAGNOSIS — K219 Gastro-esophageal reflux disease without esophagitis: Secondary | ICD-10-CM | POA: Diagnosis not present

## 2023-06-10 DIAGNOSIS — R1310 Dysphagia, unspecified: Secondary | ICD-10-CM

## 2023-06-10 DIAGNOSIS — Z860101 Personal history of adenomatous and serrated colon polyps: Secondary | ICD-10-CM

## 2023-06-10 LAB — CBC WITH DIFFERENTIAL/PLATELET
Basophils Absolute: 0.1 10*3/uL (ref 0.0–0.1)
Basophils Relative: 0.6 % (ref 0.0–3.0)
Eosinophils Absolute: 0 10*3/uL (ref 0.0–0.7)
Eosinophils Relative: 0 % (ref 0.0–5.0)
HCT: 37.8 % (ref 36.0–46.0)
Hemoglobin: 11.7 g/dL — ABNORMAL LOW (ref 12.0–15.0)
Lymphocytes Relative: 20.7 % (ref 12.0–46.0)
Lymphs Abs: 1.7 10*3/uL (ref 0.7–4.0)
MCHC: 31.1 g/dL (ref 30.0–36.0)
MCV: 72.7 fl — ABNORMAL LOW (ref 78.0–100.0)
Monocytes Absolute: 0.4 10*3/uL (ref 0.1–1.0)
Monocytes Relative: 4.5 % (ref 3.0–12.0)
Neutro Abs: 6.3 10*3/uL (ref 1.4–7.7)
Neutrophils Relative %: 74.2 % (ref 43.0–77.0)
Platelets: 238 10*3/uL (ref 150.0–400.0)
RBC: 5.19 Mil/uL — ABNORMAL HIGH (ref 3.87–5.11)
RDW: 19.6 % — ABNORMAL HIGH (ref 11.5–15.5)
WBC: 8.4 10*3/uL (ref 4.0–10.5)

## 2023-06-10 LAB — HEPATIC FUNCTION PANEL
ALT: 15 U/L (ref 0–35)
AST: 16 U/L (ref 0–37)
Albumin: 4.2 g/dL (ref 3.5–5.2)
Alkaline Phosphatase: 82 U/L (ref 39–117)
Bilirubin, Direct: 0.1 mg/dL (ref 0.0–0.3)
Total Bilirubin: 0.4 mg/dL (ref 0.2–1.2)
Total Protein: 6.4 g/dL (ref 6.0–8.3)

## 2023-06-10 LAB — IBC + FERRITIN
Ferritin: 38.8 ng/mL (ref 10.0–291.0)
Iron: 41 ug/dL — ABNORMAL LOW (ref 42–145)
Saturation Ratios: 10.8 % — ABNORMAL LOW (ref 20.0–50.0)
TIBC: 379.4 ug/dL (ref 250.0–450.0)
Transferrin: 271 mg/dL (ref 212.0–360.0)

## 2023-06-10 LAB — BASIC METABOLIC PANEL WITH GFR
BUN: 9 mg/dL (ref 6–23)
CO2: 30 meq/L (ref 19–32)
Calcium: 9.1 mg/dL (ref 8.4–10.5)
Chloride: 104 meq/L (ref 96–112)
Creatinine, Ser: 0.64 mg/dL (ref 0.40–1.20)
GFR: 94.95 mL/min (ref >60.00)
Glucose, Bld: 101 mg/dL — ABNORMAL HIGH (ref 70–99)
Potassium: 3.6 meq/L (ref 3.5–5.1)
Sodium: 142 meq/L (ref 135–145)

## 2023-06-10 LAB — GAMMA GT: GGT: 23 U/L (ref 7–51)

## 2023-06-10 LAB — PROTIME-INR
INR: 1 ratio (ref 0.8–1.0)
Prothrombin Time: 10.7 s (ref 9.6–13.1)

## 2023-06-10 MED ORDER — PANTOPRAZOLE SODIUM 40 MG PO TBEC: 40.0000 mg | DELAYED_RELEASE_TABLET | Freq: Every day | ORAL | 3 refills | Status: DC

## 2023-06-10 MED ORDER — FLUCONAZOLE 200 MG PO TABS: ORAL_TABLET | ORAL | 0 refills | Status: AC

## 2023-06-10 NOTE — Progress Notes (Signed)
 I agree with the assessment and plan as outlined by Ms. Steffanie Dunn.

## 2023-06-10 NOTE — Patient Instructions (Addendum)
 You have been scheduled for an abdominal ultrasound at [facility]iology (1st floor of hospital) on 06/19/23 at 8:00 am. Please arrive 30 minutes prior to your appointment for registration. Make certain not to have anything to eat or drink after midnight the night prior. Should you need to reschedule your appointment, please contact radiology at [phone]. This test typically takes about 30 minutes to perform.  You have been scheduled for a Barium Esophogram at [facility] (1st floor of the hospital) on 06/19/23 at 10:00 am. Please arrive 30 minutes prior to your appointment for registration. Make certain not to have anything to eat or drink after midnight the night prior. If you need to reschedule for any reason, please contact radiology at [phone] to do so. __________________________________________________________________ A barium swallow is an examination that concentrates on views of the esophagus. This tends to be a double contrast exam (barium and two liquids which, when combined, create a gas to distend the wall of the oesophagus) or single contrast (non-ionic iodine based). The study is usually tailored to your symptoms so a good history is essential. Attention is paid during the study to the form, structure and configuration of the esophagus, looking for functional disorders (such as aspiration, dysphagia, achalasia, motility and reflux) EXAMINATION You may be asked to change into a gown, depending on the type of swallow being performed. A radiologist and radiographer will perform the procedure. The radiologist will advise you of the type of contrast selected for your procedure and direct you during the exam. You will be asked to stand, sit or lie in several different positions and to hold a small amount of fluid in your mouth before being asked to swallow while the imaging is performed .In some instances you may be asked to swallow barium coated marshmallows to assess the  motility of a solid food bolus. The exam can be recorded as a digital or video fluoroscopy procedure. POST PROCEDURE It will take 1-2 days for the barium to pass through your system. To facilitate this, it is important, unless otherwise directed, to increase your fluids for the next 24-48hrs and to resume your normal diet.  This test typically takes about 30 minutes to perform. __________________________________________________________________________________   Your provider has requested that you go to the basement level for lab work before leaving today. Press "B" on the elevator. The lab is located at the first door on the left as you exit the elevator.  Please take your proton pump inhibitor medication, protonix 40 mg  Please take this medication 30 minutes to 1 hour before meals- this makes it more effective.  Avoid spicy and acidic foods Avoid fatty foods Limit your intake of coffee, tea, alcohol, and carbonated drinks Work to maintain a healthy weight Keep the head of the bed elevated at least 3 inches with blocks or a wedge pillow if you are having any nighttime symptoms Stay upright for 2 hours after eating Avoid meals and snacks three to four hours before bedtime  Metabolic dysfunction associated seatohepatitis  Now the leading cause of liver failure in the [place].  It is normally from such risk factors as obesity, diabetes, insulin resistance, high cholesterol, or metabolic syndrome.  The only definitive therapy is weight loss and exercise.   Suggest walking 20-30 mins daily.  Decreasing carbohydrates, increasing veggies.    Fatty Liver Fatty liver is the accumulation of fat in liver cells. It is also called hepatosteatosis or steatohepatitis. It is normal for your liver to  contain some fat. If fat is more than 5 to 10% of your liver's weight, you have fatty liver.  There are often no symptoms (problems) for years while damage is still occurring. People often learn  about their fatty liver when they have medical tests for other reasons. Fat can damage your liver for years or even decades without causing problems. When it becomes severe, it can cause fatigue, weight loss, weakness, and confusion. This makes you more likely to develop more serious liver problems. The liver is the largest organ in the body. It does a lot of work and often gives no warning signs when it is sick until late in a disease. The liver has many important jobs including: Breaking down foods. Storing vitamins, iron, and other minerals. Making proteins. Making bile for food digestion. Breaking down many products including medications, alcohol and some poisons.  PROGNOSIS  Fatty liver may cause no damage or it can lead to an inflammation of the liver. This is, called steatohepatitis.  Over time the liver may become scarred and hardened. This condition is called cirrhosis. Cirrhosis is serious and may lead to liver failure or cancer. NASH is one of the leading causes of cirrhosis. About 10-20% of Americans have fatty liver and a smaller 2-5% has NASH.  TREATMENT  Weight loss, fat restriction, and exercise in overweight patients produces inconsistent results but is worth trying. Good control of diabetes may reduce fatty liver. Eat a balanced, healthy diet. Increase your physical activity. There are no medical or surgical treatments for a fatty liver or NASH, but improving your diet and increasing your exercise may help prevent or reverse some of the damage.  Miralax is an osmotic laxative.  It only brings more water into the stool.  This is safe to take daily.  Can take up to 17 gram of miralax twice a day.  Mix with juice or coffee.  Start 1 capful at night for 3-4 days and reassess your response in 3-4 days.  You can increase and decrease the dose based on your response.  Remember, it can take up to 3-4 days to take effect OR for the effects to wear off.   I often pair this with  benefiber in the morning to help assure the stool is not too loose.   Toileting tips to help with your constipation - Drink at least 64-80 ounces of water/liquid per day. - Establish a time to try to move your bowels every day.  For many people, this is after a cup of coffee or after a meal such as breakfast. - Sit all of the way back on the toilet keeping your back fairly straight and while sitting up, try to rest the tops of your forearms on your upper thighs.   - Raising your feet with a step stool/squatty potty can be helpful to improve the angle that allows your stool to pass through the rectum. - Relax the rectum feeling it bulge toward the toilet water.  If you feel your rectum raising toward your body, you are contracting rather than relaxing. - Breathe in and slowly exhale. "Belly breath" by expanding your belly towards your belly button. Keep belly expanded as you gently direct pressure down and back to the anus.  A low pitched GRRR sound can assist with increasing intra-abdominal pressure.  (Can also trying to blow on a pinwheel and make it move, this helps with the same belly breathing) - Repeat 3-4 times. If unsuccessful, contract the pelvic floor  to restore normal tone and get off the toilet.  Avoid excessive straining. - To reduce excessive wiping by teaching your anus to normally contract, place hands on outer aspect of knees and resist knee movement outward.  Hold 5-10 second then place hands just inside of knees and resist inward movement of knees.  Hold 5 seconds.  Repeat a few times each way.  Go to the ER if unable to pass gas, severe AB pain, unable to hold down food, any shortness of breath of chest pain.

## 2023-06-15 ENCOUNTER — Ambulatory Visit (INDEPENDENT_AMBULATORY_CARE_PROVIDER_SITE_OTHER): Admitting: Podiatry

## 2023-06-15 DIAGNOSIS — M7662 Achilles tendinitis, left leg: Secondary | ICD-10-CM | POA: Diagnosis not present

## 2023-06-15 DIAGNOSIS — E119 Type 2 diabetes mellitus without complications: Secondary | ICD-10-CM | POA: Diagnosis not present

## 2023-06-15 LAB — HEPATITIS B SURFACE ANTIBODY,QUALITATIVE: Hep B S Ab: NONREACTIVE

## 2023-06-15 LAB — HEPATITIS A ANTIBODY, TOTAL: Hepatitis A AB,Total: NONREACTIVE

## 2023-06-15 LAB — HEPATITIS C ANTIBODY: Hepatitis C Ab: NONREACTIVE

## 2023-06-15 LAB — IGG: IgG (Immunoglobin G), Serum: 685 mg/dL (ref 600–1540)

## 2023-06-15 LAB — HEPATITIS B SURFACE ANTIGEN: Hepatitis B Surface Ag: NONREACTIVE

## 2023-06-15 LAB — MITOCHONDRIAL ANTIBODIES: Mitochondrial M2 Ab, IgG: <=20 U

## 2023-06-15 LAB — ANTI-SMOOTH MUSCLE ANTIBODY, IGG: Actin (Smooth Muscle) Antibody (IGG): <20 U

## 2023-06-15 NOTE — Progress Notes (Signed)
 Subjective:  Patient ID: Frances Medina, female    DOB: [birth year],   MRN: [id number]  No chief complaint on file.   63 y.o. female presents for follow-up of left achilles tendonitis. Relates doing 100% better. Has not had issues. Some issues with grams and newly diagnosed with diabetes. Denies  burning and tingling in their feet. Patient is diabetic and last A1c was  Lab Results  Component Value Date   HGBA1C 7.8 (A) 06/03/2023   .   PCP:  Doreene Eland, MD   .  Denies any other pedal complaints. Denies n/v/f/c.   Past Medical History:  Diagnosis Date   Acute respiratory failure (HCC)    Acute respiratory failure with hypoxemia (HCC)    Aspiration into airway    Back pain 07/31/2017   Benign neoplasm of rectum    CAP (community acquired pneumonia) 08/05/2015   Depression with anxiety    Diabetes mellitus, new onset (HCC) 06/02/2023   Diverticulosis    Dysphagia 03/2015   EGD, Dr Leone Payor. mild antral gastritis, ? due to Ibuprofen.  no stricture but empirically maloney dilated esophagus.    Dysphagia, neurologic    E. 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   GERD (gastroesophageal reflux disease)    hiatal hernia   Hypertension    "went away when I stopped smoking"   Knee injury    Left hip pain 02/18/2017   Migraine    "maybe couple times/month" (03/20/2015)   Mild intermittent asthma 06/22/2017   Myasthenia gravis (HCC) 2017   Myasthenia gravis with acute exacerbation (HCC) 05/15/2015   Osteoarthritis of left knee 12/02/2013   Osteoarthritis of right knee 08/30/2013   Pancreatitis 07/25/2017   Protein-calorie malnutrition, severe (HCC) 08/07/2015   Seasonal allergies     takes Zytrec   Sleep apnea    with cPAp machine   Tracheostomy status (HCC)    Tubular adenoma of colon    Tumors    "in my stomach"   Umbilical hernia    watching , no plans for surgery at present   Urine incontinence 10/19/2015   VAP (ventilator-associated pneumonia) (HCC)     Objective:  Physical Exam: Vascular: DP/PT pulses 2/4 bilateral. CFT <3 seconds. Normal hair growth on digits. No edema.  Skin. No lacerations or abrasions bilateral feet.  Musculoskeletal: MMT 5/5 bilateral lower extremities in DF, PF, Inversion and Eversion. Deceased ROM in DF of ankle joint. Non-tender to insertion of left achilles. Some pain with DF and PF. No pain along peroneals or PT tendon.  Neurological: Sensation intact to light touch.   Assessment:   No diagnosis found.     Plan:  Patient was evaluated and treated and all questions answered. -X-ray reviewed and noted spurring to posterior calcaneus  -Discussed Achilles insertional tendonitis and treatment options with patient.  -Continue stretching and anit-inflammatories.  -Discussed and educated patient on diabetic foot care, especially with  regards to the vascular, neurological and musculoskeletal systems.  -Stressed the importance of good glycemic control and the detriment of not  controlling glucose levels in relation to the foot. -Discussed supportive shoes at all times and checking feet regularly.  -Answered all patient questions -Patient to return  in 1 year for DM foot check  -Patient advised to call the office if any problems or questions arise in the meantime.    Jennefer Moats, DPM

## 2023-06-19 ENCOUNTER — Ambulatory Visit (HOSPITAL_COMMUNITY)
Admission: RE | Admit: 2023-06-19 | Discharge: 2023-06-19 | Disposition: A | Discharge status: Home / Self Care | Source: Ambulatory Visit | Attending: Physician Assistant | Admitting: Physician Assistant

## 2023-06-19 DIAGNOSIS — K760 Fatty (change of) liver, not elsewhere classified: Secondary | ICD-10-CM | POA: Insufficient documentation

## 2023-06-19 DIAGNOSIS — R1319 Other dysphagia: Secondary | ICD-10-CM | POA: Insufficient documentation

## 2023-06-30 ENCOUNTER — Encounter: Payer: Self-pay | Admitting: Internal Medicine

## 2023-06-30 ENCOUNTER — Ambulatory Visit: Payer: Medicare Other | Admitting: Internal Medicine

## 2023-06-30 ENCOUNTER — Ambulatory Visit (INDEPENDENT_AMBULATORY_CARE_PROVIDER_SITE_OTHER): Admitting: Internal Medicine

## 2023-06-30 ENCOUNTER — Other Ambulatory Visit: Payer: Self-pay

## 2023-06-30 VITALS — BP 122/82 | HR 100 | Temp 97.9°F | Resp 18 | Ht 60.0 in | Wt 310.1 lb

## 2023-06-30 DIAGNOSIS — L501 Idiopathic urticaria: Secondary | ICD-10-CM | POA: Diagnosis not present

## 2023-06-30 DIAGNOSIS — J310 Chronic rhinitis: Secondary | ICD-10-CM | POA: Diagnosis not present

## 2023-06-30 DIAGNOSIS — T781XXD Other adverse food reactions, not elsewhere classified, subsequent encounter: Secondary | ICD-10-CM | POA: Diagnosis not present

## 2023-06-30 DIAGNOSIS — J4520 Mild intermittent asthma, uncomplicated: Secondary | ICD-10-CM

## 2023-06-30 MED ORDER — FLUTICASONE PROPIONATE 50 MCG/ACT NA SUSP
2.0000 | Freq: Every day | NASAL | 5 refills | Status: DC
Start: 2023-06-30 — End: 2023-10-21

## 2023-06-30 MED ORDER — FEXOFENADINE HCL 180 MG PO TABS
180.0000 mg | ORAL_TABLET | Freq: Two times a day (BID) | ORAL | 5 refills | Status: DC | PRN
Start: 2023-06-30 — End: 2023-07-06

## 2023-06-30 MED ORDER — FAMOTIDINE 20 MG PO TABS: 20.0000 mg | ORAL_TABLET | Freq: Two times a day (BID) | ORAL | 5 refills | Status: DC | PRN

## 2023-06-30 NOTE — Patient Instructions (Addendum)
 Idiopathic Urticaria (Hives): - Take pictures of hives/rashes for me.  - At this time etiology of hives and swelling is unknown. Hives can be caused by a variety of different triggers including illness/infection, exercise, pressure, vibrations, extremes of temperature to name a few however majority of the time there is no identifiable trigger.  - If hives return, start Allegra  (fexofenadine ) 180 mg daily.   - If still uncontrolled after a few days, increase to Allegra  180mg  twice daily.  - If still no improvement, continue Allegra  180mg  twice daily and add Pepcid  20mg  twice daily.  -Continue Singulair  10 mg once a day  Chronic Rhinitis: - Positive skin test 03/2022: none - Use nasal saline rinses before nose sprays such as with Neilmed Sinus Rinse.  Use distilled water .   - Use Flonase  2 sprays each nostril daily. Aim upward and outward. - Use Allegra  (fexofenadine )180 mg daily as needed for runny nose, sneezing, itchy watery eyes.    Food Reactions:  - Consider in office challenge to shrimp when available.   - Okay to reintroduce avocado/pineapple at home.  - SPT 03/2022 was negative to shellfish/pineapple/avocado.  sIgE 03/2023: negative to shellfish/pineapple/avocado - for SKIN only reaction, okay to take Benadryl  25mg  capsules every 6 hours - for SKIN + ANY additional symptoms, OR IF concern for LIFE THREATENING reaction = Epipen  Autoinjector EpiPen  0.3 mg. - If using Epinephrine  autoinjector, call 911 or go to the ER.    GERD: - Follow up with GI. On Omeprazole  20mg  daily.   -Avoid lying down for at least two hours after a meal or after drinking acidic beverages, like soda, or other caffeinated beverages. This can help to prevent stomach contents from flowing back into the esophagus. -Keep your head elevated while you sleep. Using an extra pillow or two can also help to prevent reflux. -Eat smaller and more frequent meals each day instead of a few large meals. This promotes digestion and  can aid in preventing heartburn. -Wear loose-fitting clothes to ease pressure on the stomach, which can worsen heartburn and reflux. -Reduce excess weight around the midsection. This can ease pressure on the stomach. Such pressure can force some stomach contents back up the esophagus.   Reactive Airway Disease - Follow up with Pulm.  On Wixela.    Follow up: 1) shrimp challenge, Hold all anti-histamines (Xyzal, Allegra , Zyrtec , Claritin , Benadryl , Pepcid ) 3 days prior to next visit.  2) 4 months

## 2023-06-30 NOTE — Progress Notes (Signed)
 FOLLOW UP Date of Service/Encounter:  06/30/23   Subjective:  Frances Medina (DOB: 1961/01/31) is a 63 y.o. female who [facility] for follow up for idiopathic urticaria, chronic rhinitis, food reactions.  Also with reactive airway disease followed by Pulm and on PPI for GERD, followed by GI.   History obtained from: chart review and patient. Last seen 05/06/2023 with Tinnie Forehand for uncontrolled hives and biopsy 04/2023 showing superficial infiltrate of eosinophils and lymphocytes with edema, correlates with early urticaria.  Discussed lab testing for CSU and high dose Allegra /pepcid  and consider Xolair. Also to complete course of prednisone  given by PCP.    Since last visit, she reports she has not had any more hives for almost 2 months.  She is off all anti histamines, not taking Allegra  or Pepcid .  Still on Singulair .    Not much trouble with congestion, drainage, runny nose.  Uses Flonase  and Allegra  PRN.    Followed by Pulm for asthma; did require prednisone  course due to flare up related to RSV in March. On Wixela and doing well now.   Still avoiding shellfish, avocado, pineapple but would be interested in reintroduction.   Past Medical History: Past Medical History:  Diagnosis Date   Acute respiratory failure (HCC)    Acute respiratory failure with hypoxemia (HCC)    Aspiration into airway    Back pain 07/31/2017   Benign neoplasm of rectum    CAP (community acquired pneumonia) 08/05/2015   Depression with anxiety    Diabetes mellitus, new onset (HCC) 06/02/2023   Diverticulosis    Dysphagia 03/2015   EGD, Dr Willy Harvest. mild antral gastritis, ? due to Ibuprofen .  no stricture but empirically maloney dilated esophagus.    Dysphagia, neurologic    E. coli UTI 04/07/2015   Elevated LDH 05/06/2015   Encounter for routine gynecological examination 10/19/2015   Endotracheally intubated    Enteritis due to Clostridium difficile     Esophageal dysmotility 11/08/2015   Fibroid uterus    size of a dime   Gastrostomy infection (HCC) 11/08/2015   Gastrostomy infection (HCC) 11/08/2015   GERD (gastroesophageal reflux disease)    hiatal hernia   Hypertension    "went away when I stopped smoking"   Knee injury    Left hip pain 02/18/2017   Migraine    "maybe couple times/month" (03/20/2015)   Mild intermittent asthma 06/22/2017   Myasthenia gravis (HCC) 2017   Myasthenia gravis with acute exacerbation (HCC) 05/15/2015   Osteoarthritis of left knee 12/02/2013   Osteoarthritis of right knee 08/30/2013   Pancreatitis 07/25/2017   Protein-calorie malnutrition, severe (HCC) 08/07/2015   Seasonal allergies    takes Zytrec   Sleep apnea    with cPAp machine   Tracheostomy status (HCC)    Tubular adenoma of colon    Tumors    "in my stomach"   Umbilical hernia    watching , no plans for surgery at present   Urine incontinence 10/19/2015   VAP (ventilator-associated pneumonia) (HCC)     Objective:  BP 122/82 (BP Location: Left Arm, Patient Position: Sitting, Cuff Size: Large)   Pulse 100   Temp 97.9 F (36.6 C) (Temporal)   Resp 18   Ht 5' (1.524 m)   Wt (!) 310 lb 1.6 oz (140.7 kg)   SpO2 97%   BMI 60.56 kg/m  Body mass index is 60.56 kg/m. Physical Exam: GEN: alert, well developed HEENT: clear conjunctiva,  nose without inferior turbinate hypertrophy, pink nasal mucosa, no rhinorrhea, no cobblestoning HEART: regular rate and rhythm, no murmur LUNGS: clear to auscultation bilaterally, no coughing, unlabored respiration SKIN: no rashes or lesions  Spirometry:  Tracings reviewed. Her effort: Good reproducible efforts. FVC: 1.46L, 61% predicted  FEV1: 1.23L, 64% predicted FEV1/FVC ratio: 84% Interpretation: Spirometry consistent with possible restrictive disease.  Please see scanned spirometry results for details.  Assessment:   1. Idiopathic urticaria   2. Chronic rhinitis   3. Adverse food  reaction, subsequent encounter   4. Mild intermittent asthma without complication     Plan/Recommendations:  Idiopathic Urticaria (Hives): - Well controlled. No hives and off all medications except Singulair  - Will repeat tryptase to ensure levels are steady but low suspicion for mast cell disease as her hives have been well controlled without any medications.  - At this time etiology of hives and swelling is unknown. Hives can be caused by a variety of different triggers including illness/infection, exercise, pressure, vibrations, extremes of temperature to name a few however majority of the time there is no identifiable trigger.  - 05/2023: positive CU index, tryptase 13.2, elevated TPO Ab with normal TSH, elevated ESR with normal ANA, negative alpha gal  - If hives return, start Allegra  (fexofenadine ) 180 mg daily.   - If still uncontrolled after a few days, increase to Allegra  180mg  twice daily.  - If still no improvement, continue Allegra  180mg  twice daily and add Pepcid  20mg  twice daily.  - Continue Singulair  10 mg daily.   Chronic Rhinitis: - Well controlled  - Positive skin test 03/2022: none - Use nasal saline rinses before nose sprays such as with Neilmed Sinus Rinse.  Use distilled water .   - Use Flonase  2 sprays each nostril daily. Aim upward and outward. - Use Allegra  (fexofenadine )180 mg daily as needed for runny nose, sneezing, itchy watery eyes.    Food Reactions:  - Consider in office challenge to shrimp when available.   - Okay to reintroduce avocado/pineapple at home.  - SPT 03/2022 was negative to shellfish/pineapple/avocado.  sIgE 03/2023: negative to shellfish/pineapple/avocado - Initial rxn: shellfish with hives/SOB; pineapple and avocado with tongue itching  - for SKIN only reaction, okay to take Benadryl  25mg  capsules every 6 hours - for SKIN + ANY additional symptoms, OR IF concern for LIFE THREATENING reaction = Epipen  Autoinjector EpiPen  0.3 mg. - If using  Epinephrine  autoinjector, call 911 or go to the ER.   GERD: - Follow up with GI. On Omeprazole  20mg  daily.   -Avoid lying down for at least two hours after a meal or after drinking acidic beverages, like soda, or other caffeinated beverages. This can help to prevent stomach contents from flowing back into the esophagus. -Keep your head elevated while you sleep. Using an extra pillow or two can also help to prevent reflux. -Eat smaller and more frequent meals each day instead of a few large meals. This promotes digestion and can aid in preventing heartburn. -Wear loose-fitting clothes to ease pressure on the stomach, which can worsen heartburn and reflux. -Reduce excess weight around the midsection. This can ease pressure on the stomach. Such pressure can force some stomach contents back up the esophagus.   Mild Asthma  - Follow up with Pulm.  On Wixela and Singulair .    Follow up: 1) shrimp challenge, Hold all anti-histamines (Xyzal, Allegra , Zyrtec , Claritin , Benadryl , Pepcid ) 3 days prior to next visit.  2) 4 months    Kristen Petri, MD [facility] 

## 2023-06-30 NOTE — Addendum Note (Signed)
 Addended by: Amazin Pincock on: 06/30/2023 12:11 PM   Modules accepted: Level of Service

## 2023-07-02 LAB — TRYPTASE: Tryptase: 11.7 ug/L (ref 2.2–13.2)

## 2023-07-03 ENCOUNTER — Telehealth: Payer: Self-pay | Admitting: Family Medicine

## 2023-07-03 NOTE — Telephone Encounter (Signed)
 Scheduled patient a virtual visit with you on 05/05 @1010 

## 2023-07-03 NOTE — Telephone Encounter (Signed)
-----   Message from [facility] Genevia Kern S sent at 07/02/2023  3:27 PM EDT ----- Pt calling to ask Dr Grandville Lax to take the following medications off her list and to cancel them. The pharmacy keeps refilling them.  Albuterol  Famotidine  20 mg Fexofenadine  HCL 180 mg Fluconazole   Thank you, Clovis Dar

## 2023-07-03 NOTE — Telephone Encounter (Signed)
 Hello team,  I received a message about patient's request to take off some meds off her med list. Please contact her and help her schedule a virtual appointment with me to discuss medication reconciliation. Thanks.   Frances Medina

## 2023-07-06 ENCOUNTER — Ambulatory Visit: Admitting: Family Medicine

## 2023-07-06 ENCOUNTER — Encounter: Payer: Self-pay | Admitting: Family Medicine

## 2023-07-06 DIAGNOSIS — G8929 Other chronic pain: Secondary | ICD-10-CM

## 2023-07-06 DIAGNOSIS — Z7985 Long-term (current) use of injectable non-insulin antidiabetic drugs: Secondary | ICD-10-CM | POA: Diagnosis not present

## 2023-07-06 DIAGNOSIS — E119 Type 2 diabetes mellitus without complications: Secondary | ICD-10-CM | POA: Diagnosis not present

## 2023-07-06 DIAGNOSIS — M25512 Pain in left shoulder: Secondary | ICD-10-CM | POA: Diagnosis not present

## 2023-07-06 MED ORDER — MOUNJARO 5 MG/0.5ML ~~LOC~~ SOAJ: 5.0000 mg | SUBCUTANEOUS | 1 refills | Status: DC

## 2023-07-06 NOTE — Progress Notes (Signed)
 [facility] Telemedicine Visit  Patient consented to have virtual visit and was identified by name and date of birth. Method of visit: Telephone  Encounter participants: Patient: Frances Medina - located at home Provider: Penni Bowman - located at [facility] office Others (if applicable): N/A  Chief Complaint: Follow up  HPI:  Shoulder Pain  The pain is present in the left shoulder. This is a chronic problem. The current episode started more than 1 month ago (Started about 5 months ago). There has been no history of extremity trauma. The problem occurs constantly. The problem has been gradually worsening. The quality of the pain is described as aching. The pain is at a severity of 0/10 (Pain can be as bad as 10/10 in severy especially when she tries to lie on it at night). Associated symptoms include joint locking and stiffness. Pertinent negatives include no numbness. Associated symptoms comments: Stiffness and locking in the morning when she wakes.. The symptoms are aggravated by activity and contact. She has tried acetaminophen  for the symptoms. The treatment provided significant relief.    DM2: Home CGM ranges mostly in the 100 on Mounjaro. Hence, she stopped her Glipizide  altogether. She is interested in increasing her Mounjaro dose to help with weight loss as well.  Medication review: She said she was advised to d/c Albuterol  due to the steroid component. However, she could not conclusively tell me who gave this advice. Initially, she said it was Dr. Koval. She has not taken her Famotidine  and Allegra , as advised by her allergist since her skin lesions have subsided.   ROS: per HPI  Pertinent PMHx: PMHx reviewed  Exam:  There were no vitals taken for this visit.  Respiratory: No distress  Assessment/Plan:  Left shoulder pain Use Tylenol  as needed for pain Xray ordered to further assess Consider PT in the future  Diabetes mellitus, new onset (HCC) D/C  Glipizide  ans increase Mounjaro to 5 mg weekly   Polypharmacy: Medication reviewed and adjusted I advised her that Albuterol  is not a corticosteroid and is a rescue inhaler We will keep this on for now She agreed with the plan  Time spent during visit with patient: 30 minutes

## 2023-07-06 NOTE — Assessment & Plan Note (Signed)
 D/C Glipizide  ans increase Mounjaro to 5 mg weekly

## 2023-07-06 NOTE — Patient Instructions (Signed)

## 2023-07-06 NOTE — Assessment & Plan Note (Signed)
 Use Tylenol  as needed for pain Xray ordered to further assess Consider PT in the future

## 2023-07-07 ENCOUNTER — Other Ambulatory Visit (HOSPITAL_COMMUNITY): Payer: Self-pay

## 2023-07-07 ENCOUNTER — Ambulatory Visit (HOSPITAL_COMMUNITY)
Admission: RE | Admit: 2023-07-07 | Discharge: 2023-07-07 | Disposition: A | Discharge status: Home / Self Care | Source: Ambulatory Visit | Attending: Family Medicine | Admitting: Family Medicine

## 2023-07-07 ENCOUNTER — Ambulatory Visit (INDEPENDENT_AMBULATORY_CARE_PROVIDER_SITE_OTHER): Admitting: Pharmacist

## 2023-07-07 ENCOUNTER — Encounter: Payer: Self-pay | Admitting: Pharmacist

## 2023-07-07 VITALS — BP 124/73 | HR 86 | Wt 311.4 lb

## 2023-07-07 DIAGNOSIS — M25512 Pain in left shoulder: Secondary | ICD-10-CM | POA: Diagnosis present

## 2023-07-07 DIAGNOSIS — E119 Type 2 diabetes mellitus without complications: Secondary | ICD-10-CM

## 2023-07-07 DIAGNOSIS — G8929 Other chronic pain: Secondary | ICD-10-CM | POA: Diagnosis present

## 2023-07-07 MED ORDER — ACCU-CHEK SOFTCLIX LANCET DEV KIT
PACK | 0 refills | Status: AC
Start: 2023-07-07 — End: ?

## 2023-07-07 MED ORDER — ACCU-CHEK SMARTVIEW VI STRP
ORAL_STRIP | 12 refills | Status: AC
Start: 2023-07-07 — End: ?

## 2023-07-07 MED ORDER — ACCU-CHEK SOFTCLIX LANCETS MISC: 12 refills | Status: AC

## 2023-07-07 MED ORDER — ACCU-CHEK GUIDE ME W/DEVICE KIT
PACK | 0 refills | Status: AC
Start: 2023-07-07 — End: ?

## 2023-07-07 NOTE — Assessment & Plan Note (Signed)
 Diabetes - hyperglycemia greatly improved to near normal glycemic control with use of Mounjaro (tirzepatide) following course of steroids.  -Continued GLP-1/GIP Mounjaro (tirzepatide)which was recently increased by PCP from 2.5 mg to 5 mg weekly .  -Patient educated on purpose, proper use, and potential adverse effects of GI side effects with dose escalation.  -Extensively discussed pathophysiology of diabetes, recommended lifestyle interventions, dietary effects on blood sugar control.  -Counseled on s/sx of and management of hypoglycemia.  We agreed that trial OFF of CGM was appropriate given excellent level of control.  IF patient has steroid burst in the future may consider return to use of CGM.  -Patient requested new glucometer for use PRN any symptoms of hyperglycemia.  - New glucometer, strips, lancets and lancet device prescription order provided.

## 2023-07-07 NOTE — Patient Instructions (Signed)
 It was nice to see you today!  Your goal blood sugar is 80-130 before eating and less than 180 after eating.  Medication Changes: Start increased Mounjaro dose (5 mg) once weekly when available from pharmacy.   Discontinue FreeStyle Sensors, check sugar occasionally with the old sensor and test strips.   Continue all other medication the same.  Monitor blood sugars at home and keep a log (glucometer or piece of paper) to bring with you to your next visit.  Keep up the good work with diet and exercise. Aim for a diet full of vegetables, fruit and lean meats (chicken, [place], fish). Try to limit salt intake by eating fresh or frozen vegetables (instead of canned), rinse canned vegetables prior to cooking and do not add any additional salt to meals.

## 2023-07-07 NOTE — Progress Notes (Signed)
 S:     Chief Complaint  Patient presents with   Medication Management    Glucose Control and CGM   63 y.o. female who presents for diabetes evaluation, education, and management. Patient arrives in good spirits and presents without any assistance.   Patient was referred and last seen by Primary Care Provider, Dr. Grandville Lax, on 07/06/2023.  At last visit with pharmacy team we collaborated on CGM control with use of Libre 3+ sensors.   PMH is significant for Hyperglycemia when taking steroids.  Patient reports Diabetes was diagnosed in recently in association with steroid burst. .   Current diabetes medications include: Mounjaro (tirzepatide) increased by PCP, Dr Grandville Lax yesterday from 2.5 to 5mg  weekly.   Patient reports adherence to taking all medications as prescribed.   Do you feel that your medications are working for you? yes Have you been experiencing any side effects to the medications prescribed? no Do you have any problems obtaining medications due to transportation or finances? no Insurance coverage: Community education officer - state health plan  Patient reports multiple low glucose readings without hypoglycemic symptoms.  O:   Review of Systems  All other systems reviewed and are negative.   Physical Exam Vitals reviewed.  Pulmonary:     Effort: Pulmonary effort is normal.  Neurological:     Mental Status: She is alert.  Psychiatric:        Mood and Affect: Mood normal.        Thought Content: Thought content normal.        Judgment: Judgment normal.     Libre3 CGM Download today 07/07/2023 % Time CGM is active: 98% Average Glucose: 106 mg/dL Glucose Management Indicator: 5.8  Glucose Variability: 17.8% (goal <36%) Time in Goal:  - Time in range 70-180: 100% - Multiple readings < 70 reports however none were at the level where patient experience symptoms (High 60s typically - which was sending glucose alarm alert to patient) Observed patterns:  Great control   Lab Results   Component Value Date   HGBA1C 7.8 (A) 06/03/2023   Vitals:   07/07/23 1123  BP: 124/73  Pulse: 86    Lipid Panel     Component Value Date/Time   CHOL 187 03/16/2023 1055   TRIG 110 03/16/2023 1055   HDL 46 03/16/2023 1055   CHOLHDL 4.1 03/16/2023 1055   CHOLHDL 2.7 07/25/2017 1846   VLDL 9 07/25/2017 1846   LDLCALC 121 (H) 03/16/2023 1055    Clinical Atherosclerotic Cardiovascular Disease (ASCVD): No  The 10-year ASCVD risk score (Arnett DK, et al., 2019) is: 13.3%   Values used to calculate the score:     Age: 35 years     Sex: Female     Is Non-Hispanic African American: Yes     Diabetic: Yes     Tobacco smoker: No     Systolic Blood Pressure: 124 mmHg     Is BP treated: No     HDL Cholesterol: 46 mg/dL     Total Cholesterol: 187 mg/dL    A/P: Diabetes - hyperglycemia greatly improved to near normal glycemic control with use of Mounjaro (tirzepatide) following course of steroids.  -Continued GLP-1/GIP Mounjaro (tirzepatide)which was recently increased by PCP from 2.5 mg to 5 mg weekly .  -Patient educated on purpose, proper use, and potential adverse effects of GI side effects with dose escalation.  -Extensively discussed pathophysiology of diabetes, recommended lifestyle interventions, dietary effects on blood sugar control.  -Counseled on s/sx  of and management of hypoglycemia.  We agreed that trial OFF of CGM was appropriate given excellent level of control.  IF patient has steroid burst in the future may consider return to use of CGM.  -Patient requested new glucometer for use PRN any symptoms of hyperglycemia.  - New glucometer, strips, lancets and lancet device prescription order provided.     Written patient instructions provided. Patient verbalized understanding of treatment plan.  Total time in face to face counseling 18 minutes.    Follow-up:  Pharmacist PRN PCP clinic visit in 1 month Patient seen with Noah [place], PharmD Candidate

## 2023-07-08 ENCOUNTER — Encounter: Payer: Self-pay | Admitting: Family Medicine

## 2023-07-08 ENCOUNTER — Other Ambulatory Visit: Payer: Self-pay | Admitting: Family Medicine

## 2023-07-08 DIAGNOSIS — M19012 Primary osteoarthritis, left shoulder: Secondary | ICD-10-CM

## 2023-07-08 DIAGNOSIS — M779 Enthesopathy, unspecified: Secondary | ICD-10-CM

## 2023-07-14 ENCOUNTER — Encounter: Admitting: Family

## 2023-07-15 ENCOUNTER — Encounter: Payer: Self-pay | Admitting: Family Medicine

## 2023-07-15 ENCOUNTER — Other Ambulatory Visit: Payer: Self-pay | Admitting: Family Medicine

## 2023-07-15 NOTE — Telephone Encounter (Signed)
 Patient stated that she already have supplies and that she will see you at the appointment she already made on 06/03.

## 2023-07-15 NOTE — Progress Notes (Signed)
 Reviewed and agree with Dr Macky Lower plan.

## 2023-07-29 ENCOUNTER — Other Ambulatory Visit: Payer: Self-pay | Admitting: Pulmonary Disease

## 2023-08-02 ENCOUNTER — Other Ambulatory Visit: Payer: Self-pay | Admitting: Family Medicine

## 2023-08-03 ENCOUNTER — Encounter: Payer: Self-pay | Admitting: Family Medicine

## 2023-08-04 ENCOUNTER — Ambulatory Visit (INDEPENDENT_AMBULATORY_CARE_PROVIDER_SITE_OTHER): Admitting: Family Medicine

## 2023-08-04 ENCOUNTER — Encounter: Payer: Self-pay | Admitting: Family Medicine

## 2023-08-04 VITALS — BP 131/72 | HR 88 | Ht 60.0 in | Wt 310.5 lb

## 2023-08-04 DIAGNOSIS — Z6841 Body Mass Index (BMI) 40.0 and over, adult: Secondary | ICD-10-CM | POA: Diagnosis not present

## 2023-08-04 DIAGNOSIS — M25561 Pain in right knee: Secondary | ICD-10-CM

## 2023-08-04 DIAGNOSIS — Z1231 Encounter for screening mammogram for malignant neoplasm of breast: Secondary | ICD-10-CM

## 2023-08-04 DIAGNOSIS — L509 Urticaria, unspecified: Secondary | ICD-10-CM | POA: Diagnosis not present

## 2023-08-04 DIAGNOSIS — M25512 Pain in left shoulder: Secondary | ICD-10-CM | POA: Diagnosis not present

## 2023-08-04 DIAGNOSIS — F325 Major depressive disorder, single episode, in full remission: Secondary | ICD-10-CM

## 2023-08-04 DIAGNOSIS — E1169 Type 2 diabetes mellitus with other specified complication: Secondary | ICD-10-CM

## 2023-08-04 MED ORDER — FEXOFENADINE HCL 180 MG PO TABS: 180.0000 mg | ORAL_TABLET | Freq: Every day | ORAL | 1 refills | Status: DC

## 2023-08-04 NOTE — Patient Instructions (Signed)

## 2023-08-04 NOTE — Assessment & Plan Note (Addendum)
 Worsened while on Prednisone  despite Mounjaro Increase Mounjaro to 5 mg weekly Monitor closely for now

## 2023-08-04 NOTE — Assessment & Plan Note (Addendum)
 Complete course of prednisone  - does not seem to be helpful Start Allegra  - med escribed F/U soon if symptoms worsen

## 2023-08-04 NOTE — Progress Notes (Signed)
 SUBJECTIVE:   CHIEF COMPLAINT / HPI:   Hives: Started about two days ago. She broke into new hives this morning. She attributed this to worsening family stress from extended family members. She is still on her tapering dose of prednisone  and she is now on 10 mg QD for five more doses. She is compliant with Singulair  10 mg QHS; otherwise, she is not on antihistamines.  DM2/Weight management: She is still on Mounjaro 2.5 mg weekly and has yet to pick up her Mounjaro 5 mg weekly. She does not check her CBG regularly. She gets yearly eye exam at [facility]. Last exam was 05/2022  Shoulder pain: Still experiencing left shoulder pain. She is scheduled to see an orthopedic and PT.  Knee pain: C/O right knee muscle pain, which is worse with weight bearing and ambulation. No trauma to her knee. Pain is mostly around the medial border of her right knee.  Depression: Undergoing stress, but endorsed stable mood on her current regimen.  PERTINENT  PMH / PSH: PMHx reviewed  OBJECTIVE:   BP 131/72   Pulse 88   Ht 5' (1.524 m)   Wt (!) 310 lb 8 oz (140.8 kg)   SpO2 99%   BMI 60.64 kg/m   Physical Exam Vitals and nursing note reviewed.  Constitutional:      General: She is not in acute distress.    Appearance: She is obese.  Cardiovascular:     Rate and Rhythm: Normal rate and regular rhythm.     Heart sounds: Normal heart sounds. No murmur heard. Pulmonary:     Effort: Pulmonary effort is normal. No respiratory distress.     Breath sounds: Normal breath sounds. No wheezing.  Musculoskeletal:     Right shoulder: Normal.     Left shoulder: Normal.     Right knee: Normal range of motion.     Left knee: Normal range of motion.     Comments: No leg edema Puffy and moderate tenderness of the lateral border of her right knee. No erythema or effusion  Skin:    Comments: Scanty, mildly erythematous maculopapular lesion on her left elbow and right arm  Neurological:     General: No focal  deficit present.     Mental Status: She is oriented to person, place, and time.     Comments:  PHQ-9 Total Score 3          ASSESSMENT/PLAN:   Assessment & Plan Type 2 diabetes mellitus with other specified complication, without long-term current use of insulin  [facility]) Not checking her Glucose at home. On Mounjaro 2.5 mg SQ weekly Glipizide  was d/ced since she was meant to be off Prednisone  Despite the report that she self-discontinued her Prednisone  taper during her last visit with me, she continued to take this medication. She said she thought it was her DM medication. I advised her to now take Prednisone  every other day instead of daily and d/c once she completes the medicine - She verbalized instructions and read back to me. Resume Mounjaro at 5 mg weekly Return for A1C in 1-2 months. She will call to schedule ophthalmology DM eye exam appointment Encounter for screening mammogram for malignant neoplasm of breast Mammogram ordered She will call to schedule her appointment  Major depressive disorder in remission, unspecified whether recurrent (HCC) Stable on Zoloft  150 mg every day and Klonopin  prn Continue same for now BMI 60.0-69.9, adult (HCC) Worsened while on Prednisone  despite Mounjaro Increase Mounjaro to 5 mg  weekly Monitor closely for now Hives Complete course of prednisone  - does not seem to be helpful Start Allegra  - med escribed F/U soon if symptoms worsen Right knee pain, unspecified chronicity Differentials: OA, Plica, bursitis Wonder if this will improve with weight loss May use Tylenol  as needed for pain Consider repeat xray if no improvement Left shoulder pain, unspecified chronicity PT and orthopedic appointments scheduled   Discussed Tdap vaccination at the pharmacy. Per the nurse, we are unable to give due to her payor source.  Penni Bowman, MD [facility]

## 2023-08-04 NOTE — Assessment & Plan Note (Addendum)
 Stable on Zoloft  150 mg every day and Klonopin  prn Continue same for now

## 2023-08-04 NOTE — Assessment & Plan Note (Addendum)
 Differentials: OA, Plica, bursitis Wonder if this will improve with weight loss May use Tylenol  as needed for pain Consider repeat xray if no improvement

## 2023-08-04 NOTE — Assessment & Plan Note (Addendum)
 PT and orthopedic appointments scheduled

## 2023-08-05 ENCOUNTER — Ambulatory Visit: Payer: Self-pay | Admitting: Family Medicine

## 2023-08-05 LAB — MICROALBUMIN / CREATININE URINE RATIO
Creatinine, Urine: 161.7 mg/dL
Microalb/Creat Ratio: 21 mg/g{creat} (ref 0–29)
Microalbumin, Urine: 33.9 ug/mL

## 2023-08-07 ENCOUNTER — Ambulatory Visit (INDEPENDENT_AMBULATORY_CARE_PROVIDER_SITE_OTHER): Admitting: Surgical

## 2023-08-07 DIAGNOSIS — M19012 Primary osteoarthritis, left shoulder: Secondary | ICD-10-CM | POA: Diagnosis not present

## 2023-08-07 DIAGNOSIS — M25512 Pain in left shoulder: Secondary | ICD-10-CM

## 2023-08-07 NOTE — Progress Notes (Signed)
 08/10/2023 Frances Medina [id number] 06/03/1960  Referring provider: Arn Lane, MD Primary GI doctor: Dr. Rosaline Coma (Dr. Sandrea Cruel)  ASSESSMENT AND PLAN:  Iron  deficiency anemia with history of microcytic anemia thrombocytopenia 3 years ago 2 months ago 06/10/2023  HGB 11.7 MCV 72.7 Platelets 238.0 06/10/2023 Iron  41 Ferritin 38.8 B12 629 No overt GI bleeding, new anemia Recent Labs    03/16/23 1055 05/06/23 1041 05/25/23 0720 05/25/23 0734 06/10/23 0944  HGB 12.0 11.5 11.4* 19.0* 11.7*  Will schedule for EGD to evaluate IDA with dysphagia, epigastric pain, and evaluate for varices with hepatomegaly/history of thrombocytopenia, will schedule with colonoscopy as well since this is a new IDA and she is due for a colonoscopy in 3 years.  EGD/colonoscopy at the hospital due to BMI of 60 for IDA Risk of bowel prep, conscious sedation, and EGD and colonoscopy were discussed.  Risks include but are not limited to dehydration, pain, bleeding, cardiopulmonary process, bowel perforation, or other possible adverse outcomes..  Treatment plan was discussed with patient, and agreed upon.  GERD with dysphagia acutely/odynophagia  07/2019 endoscopy normal esophagus, erythematous mucosa gastric antrum, erythematous mucosa anterior gastric body, postsurgical deformity of gastric body normal duodenum Mild chronic gastritis without H. pylori or metaplasia 06/19/2023 barium swallow nonspecific esophageal motility disorder 13 mm barium tablet passed briskly in stomach Increased omeprazole  to 40 mg once daily and empiric treatment with Diflucan , improvement in the odyophagia, continuing dysphagia with epigastric AB pain - endoscopy at hospital due to BMI and IDA to evaluate for gastritis/esophagitis/varices  Personal history of colon polyps Colonoscopy 07/2019 2 polyps 5 to 7 mm descending colon Hyperplastic polyps recall 7 years Recall 07/2026 Plan for colonoscopy sooner in the hospital with EGD for  IDA and BMI We have discussed the risks of bleeding, infection, perforation, medication reactions, and remote risk of death associated with colonoscopy. All questions were answered and the patient acknowledges these risk and wishes to proceed.  Metabolic dysfunction associated seatohepatitis (MASH, formerly NASH)  by history and ultrasound, kPA 13, some concern for advanced fibrosis    Latest Ref Rng & Units 06/10/2023    9:44 AM 05/06/2023   10:41 AM 05/02/2020   11:19 PM  Hepatic Function  Total Protein 6.0 - 8.3 g/dL 6.4  6.9  6.7   Albumin 3.5 - 5.2 g/dL 4.2  4.4  3.4   AST 0 - 37 U/L 16  14  18    ALT 0 - 35 U/L 15  12  14    Alk Phosphatase 39 - 117 U/L 82  118  75   Total Bilirubin 0.2 - 1.2 mg/dL 0.4  0.2  0.5   Bilirubin, Direct 0.0 - 0.3 mg/dL 0.1     MELD 3.0: 7  03/3242 hepatitis panel negative, needs hepatitis A and B vaccination IgG and ASMA, AMA negative, GGT normal, ANA negative 06/19/2023 coarse echogenic liver diffuse hepatic steatosis no focal lesions normal spleen K PA 13.1 and highly suggestive of advanced chronic liver disease - need LFTs and CBC monitored every 6 months -Suggest right upper quadrant ultrasound every 6 months -Continue to work on risk factor modification including diet exercise and control of risk factors including blood sugars. - continue mounjaro  Morbid obesity was unable to complete Roux-en-Y due to hepatomegaly 2022 Body mass index is 60.74 kg/m.  -Patient has been advised to make an attempt to improve diet and exercise patterns to aid in weight loss. -Recommended diet heavy in fruits and veggies and  low in animal meats, cheeses, and dairy products, appropriate calorie intake - on mounjaro started Monday, discussed GLP   Cholelithiasis seen on imaging without inflammation Patient has no symptoms at this time, labs are normal.  -We went over symptoms of cholecystitis, cholangitis and pancreatitis that would prompt her to return to the ER if they  happen.  -Advised low fat diet.  -Agrees to monitor at his time.   Type 2 diabetes A1c 8 on 05/27/2023 On CGM Now on mounjaro  OSA on CPAP  Myasthenia gravis In remission  Hives Following with PCP, suggest referral to allergist  Patient Care Team: Arn Lane, MD as PCP - General (Family Medicine) Euell Herrlich, MD as PCP - Cardiology (Cardiology) Dorothe Gaster, RD as Dietitian (Family Medicine)  HISTORY OF PRESENT ILLNESS: 63 y.o. female with a past medical history listed below presents for evaluation of dysphagia and GERD.   Previously known to Dr. Sandrea Cruel, last seen in the office 2024 by myself for odynophagia/dysphagia.  Discussed the use of AI scribe software for clinical note transcription with the patient, who gave verbal consent to proceed.  History of Present Illness   Frances Medina is a 63 year old female who presents with swallowing difficulties and hives.  She continues to experience swallowing difficulties, describing a sensation of food feeling 'stuck' in her esophagus, particularly at night. She avoids eating after 6 PM due to discomfort and experiences reflux symptoms upon waking. Sitting up straight helps the food pass down. No painful swallowing currently, but there is occasional stomach pain. A barium swallow study on April 18 showed nonspecific esophageal motility issues. She was treated with Diflucan  for suspected esophageal yeast infection and is currently taking omeprazole  40 mg once daily.  She reports a recent onset of hives since Saturday, which are itchy and spread across her body, including her stomach and back. She suspects a possible reaction to laundry detergent and has reverted to using her previous detergent without improvement. She is taking fexofenadine  (Allegra ) twice daily, but it has not alleviated the symptoms.  She has a history of anemia, with previous lab results showing an iron  level of 41, low normal ferritin at 38, and iron   saturation of 10.8%. She is due for a colonoscopy in three years. An ultrasound of her liver showed diffuse fatty liver. She has a history of low platelets, which may be related to liver issues. She is currently on Mounjaro, with a recent dose increase to 5 mg, but has to wait until June 16 to resume due to insurance constraints.  No abnormal bowel movements, black stools, or blood in the stool. She is compliant with her CPAP therapy and is not on any blood thinners.      She  reports that she quit smoking about 11 years ago. Her smoking use included cigarettes. She started smoking about 49 years ago. She has a 57.3 pack-year smoking history. She has been exposed to tobacco smoke. She has never used smokeless tobacco. She reports that she does not drink alcohol and does not use drugs.  RELEVANT GI HISTORY, IMAGING AND LABS: Results   LABS Iron : 41 Ferritin: 38 Iron  saturation: 10.8  RADIOLOGY Barium swallow: Nonspecific motility disorder, no evidence of narrowing, tablet passed quickly into the stomach (07/19/2023) Liver ultrasound: Diffuse fatty liver, suggestive of advanced fibrosis, cholelithiasis without cholecystitis      CBC    Component Value Date/Time   WBC 8.4 06/10/2023 0944   RBC 5.19 (H)  06/10/2023 0944   HGB 11.7 (L) 06/10/2023 0944   HGB 11.5 05/06/2023 1041   HGB 12.4 10/30/2015 1112   HCT 37.8 06/10/2023 0944   HCT 36.9 05/06/2023 1041   HCT 38.9 10/30/2015 1112   PLT 238.0 06/10/2023 0944   PLT 159 05/06/2023 1041   MCV 72.7 (L) 06/10/2023 0944   MCV 71 (L) 05/06/2023 1041   MCV 72.2 (L) 10/30/2015 1112   MCH 22.3 (L) 05/25/2023 0720   MCHC 31.1 06/10/2023 0944   RDW 19.6 (H) 06/10/2023 0944   RDW 16.9 (H) 05/06/2023 1041   RDW 19.0 (H) 10/30/2015 1112   LYMPHSABS 1.7 06/10/2023 0944   LYMPHSABS 1.6 05/06/2023 1041   LYMPHSABS 1.0 10/30/2015 1112   MONOABS 0.4 06/10/2023 0944   MONOABS 0.4 10/30/2015 1112   EOSABS 0.0 06/10/2023 0944   EOSABS 0.0  05/06/2023 1041   BASOSABS 0.1 06/10/2023 0944   BASOSABS 0.0 05/06/2023 1041   BASOSABS 0.0 10/30/2015 1112   Recent Labs    03/16/23 1055 05/06/23 1041 05/25/23 0720 05/25/23 0734 06/10/23 0944  HGB 12.0 11.5 11.4* 19.0* 11.7*    CMP     Component Value Date/Time   NA 142 06/10/2023 0944   NA 144 05/06/2023 1041   NA 141 10/30/2015 1112   K 3.6 06/10/2023 0944   K 4.2 10/30/2015 1112   CL 104 06/10/2023 0944   CO2 30 06/10/2023 0944   CO2 26 10/30/2015 1112   GLUCOSE 101 (H) 06/10/2023 0944   GLUCOSE 94 10/30/2015 1112   BUN 9 06/10/2023 0944   BUN 11 05/06/2023 1041   BUN 21.6 10/30/2015 1112   CREATININE 0.64 06/10/2023 0944   CREATININE 0.72 11/07/2015 0911   CREATININE 0.7 10/30/2015 1112   CALCIUM 9.1 06/10/2023 0944   CALCIUM 9.9 10/30/2015 1112   PROT 6.4 06/10/2023 0944   PROT 6.9 05/06/2023 1041   PROT 7.1 10/30/2015 1112   ALBUMIN 4.2 06/10/2023 0944   ALBUMIN 4.4 05/06/2023 1041   ALBUMIN 3.4 (L) 10/30/2015 1112   AST 16 06/10/2023 0944   AST 13 10/30/2015 1112   ALT 15 06/10/2023 0944   ALT 16 10/30/2015 1112   ALKPHOS 82 06/10/2023 0944   ALKPHOS 80 10/30/2015 1112   BILITOT 0.4 06/10/2023 0944   BILITOT 0.2 05/06/2023 1041   BILITOT 0.40 10/30/2015 1112   GFRNONAA >60 05/25/2023 0720   GFRNONAA >89 11/07/2015 0911   GFRAA 109 08/26/2019 1132   GFRAA >89 11/07/2015 0911      Latest Ref Rng & Units 06/10/2023    9:44 AM 05/06/2023   10:41 AM 05/02/2020   11:19 PM  Hepatic Function  Total Protein 6.0 - 8.3 g/dL 6.4  6.9  6.7   Albumin 3.5 - 5.2 g/dL 4.2  4.4  3.4   AST 0 - 37 U/L 16  14  18    ALT 0 - 35 U/L 15  12  14    Alk Phosphatase 39 - 117 U/L 82  118  75   Total Bilirubin 0.2 - 1.2 mg/dL 0.4  0.2  0.5   Bilirubin, Direct 0.0 - 0.3 mg/dL 0.1         Current Medications:   Current Outpatient Medications (Endocrine & Metabolic):    metFORMIN (GLUCOPHAGE-XR) 500 MG 24 hr tablet, Take 1,000 mg by mouth.   tirzepatide (MOUNJARO) 5  MG/0.5ML Pen, Inject 5 mg into the skin once a week.  Current Outpatient Medications (Cardiovascular):    EPINEPHrine  0.3 mg/0.3 mL  IJ SOAJ injection, Inject 0.3 mg into the muscle as needed for anaphylaxis.  Current Outpatient Medications (Respiratory):    albuterol  (PROVENTIL ) (2.5 MG/3ML) 0.083% nebulizer solution, Take 3 mLs (2.5 mg total) by nebulization every 6 (six) hours as needed for wheezing or shortness of breath.   albuterol  (VENTOLIN  HFA) 108 (90 Base) MCG/ACT inhaler, INHALE 2 PUFFS INTO THE LUNGS EVERY 6 HOURS AS NEEDED FOR WHEEZE OR SHORTNESS OF BREATH   fexofenadine  (ALLEGRA ) 180 MG tablet, Take 1 tablet (180 mg total) by mouth daily.   fluticasone  (FLONASE ) 50 MCG/ACT nasal spray, Place 2 sprays into both nostrils daily.   montelukast  (SINGULAIR ) 10 MG tablet, TAKE 1 TABLET BY MOUTH EVERYDAY AT BEDTIME   WIXELA INHUB 250-50 MCG/ACT AEPB, INHALE 1 PUFF INTO THE LUNGS IN THE MORNING AND AT BEDTIME.  Current Outpatient Medications (Analgesics):    acetaminophen  (TYLENOL ) 500 MG tablet, Take 500 mg by mouth in the morning and at bedtime.  Current Outpatient Medications (Hematological):    Cyanocobalamin  (VITAMIN B12 PO), Take 1 tablet by mouth daily.  Current Outpatient Medications (Other):    Accu-Chek Softclix Lancets lancets, Use as instructed   APPLE CIDER VINEGAR PO, Take 1 capsule by mouth daily.   Ascorbic Acid (VITAMIN C PO), Take 1,000 mg by mouth daily.   betamethasone  dipropionate 0.05 % cream, Apply topically 2 (two) times daily.   Blood Glucose Monitoring Suppl (ACCU-CHEK GUIDE ME) w/Device KIT, Use as directed.   clonazePAM  (KLONOPIN ) 0.5 MG tablet, TAKE 1 TABLET BY MOUTH TWICE A DAY AS NEEDED FOR ANXIETY   Continuous Glucose Sensor (FREESTYLE LIBRE 3 PLUS SENSOR) MISC, Change sensor every 15 days.   glucose blood (ACCU-CHEK SMARTVIEW) test strip, Use as instructed   Lancets Misc. (ACCU-CHEK SOFTCLIX LANCET DEV) KIT, Use as directed   Menthol  10 MG LOZG, Take  1 lozenge by mouth daily as needed (cough).   mycophenolate  (CELLCEPT ) 500 MG tablet, Take 500 mg by mouth 2 (two) times daily.   nystatin  (MYCOSTATIN /NYSTOP ) powder, APPLY TOPICALLY 3 TIMES DAILY AS NEEDED.   pantoprazole  (PROTONIX ) 40 MG tablet, Take 1 tablet (40 mg total) by mouth daily.   sertraline  (ZOLOFT ) 50 MG tablet, TAKE 3 TABLETS BY MOUTH EVERY DAY   VITAMIN D , CHOLECALCIFEROL, PO, Take 1 tablet by mouth daily. otc  Medical History:  Past Medical History:  Diagnosis Date   Acute respiratory failure (HCC)    Acute respiratory failure with hypoxemia (HCC)    Aspiration into airway    Back pain 07/31/2017   Benign neoplasm of rectum    CAP (community acquired pneumonia) 08/05/2015   Depression with anxiety    Diabetes mellitus, new onset (HCC) 06/02/2023   Diverticulosis    Dizziness 03/13/2023   Dysphagia 03/2015   EGD, Dr Willy Harvest. mild antral gastritis, ? due to Ibuprofen .  no stricture but empirically maloney dilated esophagus.    Dysphagia, neurologic    E. 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   GERD (gastroesophageal reflux disease)    hiatal hernia   Hypertension    "went away when I stopped smoking"   Knee injury    Left hip pain 02/18/2017   Migraine    "maybe couple times/month" (  03/20/2015)   Mild intermittent asthma 06/22/2017   Myasthenia gravis (HCC) 2017   Myasthenia gravis with acute exacerbation (HCC) 05/15/2015   Osteoarthritis of left knee 12/02/2013   Osteoarthritis of right knee 08/30/2013   Pancreatitis 07/25/2017   Protein-calorie malnutrition, severe (HCC) 08/07/2015   Seasonal allergies    takes Zytrec   Shortness of breath    Sinusitis 02/25/2018    Sleep apnea    with cPAp machine   Tracheostomy status (HCC)    Tubular adenoma of colon    Tumors    "in my stomach"   Umbilical hernia    watching , no plans for surgery at present   Urine incontinence 10/19/2015   VAP (ventilator-associated pneumonia) (HCC)    Allergies:  Allergies  Allergen Reactions   Ciprofloxacin  Shortness Of Breath and Rash   Naproxen  Shortness Of Breath   Shrimp [Shellfish Allergy ] Hives and Shortness Of Breath    ER required   Dicyclomine Hives   Methocarbamol  Hives   Pineapple Itching    Itchy lips and tongue   Morphine  And Codeine  Other (See Comments)    Severe headache     Surgical History:  She  has a past surgical history that includes Cesarean section (2956; 1989); Dilation and curettage of uterus; Tubal ligation (1989); Vaginal hysterectomy (1990's?); Partial knee arthroplasty (Right, 08/30/2013); Colonoscopy with propofol  (N/A, 09/19/2013); Partial knee arthroplasty (Left, 12/02/2013); Esophagogastroduodenoscopy (egd) with propofol  (N/A, 03/21/2015); Esophagogastroduodenoscopy (N/A, 08/10/2015); PEG placement (N/A, 08/10/2015); Colonoscopy with propofol  (N/A, 07/05/2019); Esophagogastroduodenoscopy (egd) with propofol  (N/A, 07/05/2019); polypectomy (07/05/2019); and biopsy (07/05/2019). Family History:  Her family history includes Allergic rhinitis in her sister and sister; Anemia in her sister; Atopy in her sister; COPD in her brother; Heart disease (age of onset: 85) in her mother; Hypertension in her father and mother; Leukemia in her sister and sister; Other in her sister; Stroke in her father.  REVIEW OF SYSTEMS  : All other systems reviewed and negative except where noted in the History of Present Illness.  PHYSICAL EXAM: BP 124/74   Pulse 78   Ht 5' (1.524 m)   Wt (!) 311 lb (141.1 kg)   BMI 60.74 kg/m  Physical Exam   GENERAL APPEARANCE: morbidly obese, in no apparent distress. HEENT: Oral cavity without white plaques or yeast. No cervical  lymphadenopathy, unremarkable thyroid , sclerae anicteric, conjunctiva pink. RESPIRATORY: Respiratory effort normal, lungs clear to auscultation bilaterally without rales, rhonchi, or wheezing. CARDIO: Heart with occasional trigeminy, normal rhythm. Peripheral pulses intact. ABDOMEN: Soft, non-distended, active bowel sounds in all 4 quadrants, mild pain in the right upper quadrant, no rebound, no mass appreciated. RECTAL: Declines. MUSCULOSKELETAL: Full ROM, normal gait, without edema. SKIN: Dry, intact without rashes or lesions. No jaundice. Hives along bilateral arms, back, and AB. NEURO: Alert, oriented, no focal deficits. PSYCH: Cooperative, normal mood and affect. EXTREMITIES: 1+ edema in legs, no redness or warmth.      Edmonia Gottron, PA-C 9:44 AM

## 2023-08-07 NOTE — Progress Notes (Signed)
 Office Visit Note   Patient: Frances Medina           Date of Birth: December 01, 1960           MRN: [id number] Visit Date: 08/07/2023 Requested by: Arn Lane, MD [address] [place],  [name] [address] PCP: Arn Lane, MD  Subjective: No chief complaint on file.   HPI: Frances Medina is a 63 y.o. female who presents to the office reporting left shoulder pain.  Pain has been ongoing for few weeks without history of injury.  She was previously on high-dose steroids for treatment of COVID that she contracted last December.  This resulted in a lot of of hives throughout her body that lasted until April and now she has been off the steroids for 1 to 2 months now that she has gotten over the hives.  Now that she has discontinued the steroids that is when she started to notice increased shoulder pain.  Has not had issues with her shoulder in the past.  No prior history of surgery or dislocation.  Mostly localizes pain to the anterior aspect of the shoulder with some radiation into her pec but not down her arm.  No scapular pain, radicular pain, numbness/tingling.  Does have occasional trapezius pain.  Pain wakes her up at night.  She has difficulty with overhead motion.  She is disabled and does not work but she does help out at her church and occasionally will help care for her sister who has recovered from cancer..                ROS: All systems reviewed are negative as they relate to the chief complaint within the history of present illness.  Patient denies fevers or chills.  Assessment & Plan: Visit Diagnoses:  1. Arthritis of left glenohumeral joint   2. Acute pain of left shoulder     Plan: Patient is a 63 year old female who presents for evaluation of left shoulder pain.  She has radiographs of the left shoulder demonstrating mild arthritis of the glenohumeral joint with possible loose body and degenerative changes of the greater tuberosity.  Pain has been bothering her  over the last month with no history of injury.  No rotator cuff weakness or significantly different range of motion compared with contralateral shoulder.  Discussed options available to patient.  She is not interested in trying injection.  Would like to try physical therapy so we will set this up for her upstairs.  She will try this and follow-up with the office as needed if symptoms do not improve.  Follow-Up Instructions: No follow-ups on file.   Orders:  No orders of the defined types were placed in this encounter.  No orders of the defined types were placed in this encounter.     Procedures: No procedures performed   Clinical Data: No additional findings.  Objective: Vital Signs: There were no vitals taken for this visit.  Physical Exam:  Constitutional: Patient appears well-developed HEENT:  Head: Normocephalic Eyes:EOM are normal Neck: Normal range of motion Cardiovascular: Normal rate Pulmonary/chest: Effort normal Neurologic: Patient is alert Skin: Skin is warm Psychiatric: Patient has normal mood and affect  Ortho Exam: Ortho exam demonstrates right shoulder with 60 degrees X rotation, 100 degrees abduction, 170 degrees forward elevation passively and actively.  This compared with the left shoulder with 60 degrees X rotation, 90 degrees abduction, 160 degrees forward elevation passively and actively.  She has intact EPL,  FPL, finger abduction, pronation/supination, bicep, tricep, deltoid of the left arm.  Axillary nerve intact with deltoid firing.  Intact rotator cuff strength of supra, infra, subscap rated 5/5.  Moderate tenderness over the bicipital groove.  No tenderness over the [facility] joint.  Negative Neer impingement sign.  Negative Lhermitte sign.  Negative Spurling sign.  Specialty Comments:  No specialty comments available.  Imaging: No results found.   PMFS History: Patient Active Problem List   Diagnosis Date Noted   Left shoulder pain 07/06/2023    Diabetes mellitus, new onset (HCC) 06/02/2023   Hives 04/15/2023   Knee pain 03/13/2023   BMI 60.0-69.9, adult (HCC) 01/26/2023   Lumbar radiculopathy 12/03/2021   Positive ANA (antinuclear antibody) 08/26/2019   History of colonic polyps    Macromastia 06/17/2019   Seasonal allergies 02/18/2019   GAD (generalized anxiety disorder) 08/06/2018   Iron  deficiency anemia 07/31/2017   Osteoarthritis of left hip 02/18/2017   OSA on CPAP 09/11/2016   Restrictive lung disease 10/11/2015   Major depression    Headache, migraine    Splenomegaly 04/07/2015   Myasthenia gravis (HCC)    Osteoarthritis of left knee 12/02/2013   Benign neoplasm of descending colon 09/19/2013   Osteoarthritis of right knee 08/30/2013   GERD (gastroesophageal reflux disease) 06/23/2013   Fibroids 06/23/2013   Past Medical History:  Diagnosis Date   Acute respiratory failure (HCC)    Acute respiratory failure with hypoxemia (HCC)    Aspiration into airway    Back pain 07/31/2017   Benign neoplasm of rectum    CAP (community acquired pneumonia) 08/05/2015   Depression with anxiety    Diabetes mellitus, new onset (HCC) 06/02/2023   Diverticulosis    Dizziness 03/13/2023   Dysphagia 03/2015   EGD, Dr Willy Harvest. mild antral gastritis, ? due to Ibuprofen .  no stricture but empirically maloney dilated esophagus.    Dysphagia, neurologic    E. 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   GERD (gastroesophageal reflux disease)    hiatal hernia   Hypertension    "went away when I stopped smoking"   Knee injury    Left hip pain 02/18/2017   Migraine    "maybe couple times/month" (03/20/2015)   Mild intermittent asthma  06/22/2017   Myasthenia gravis (HCC) 2017   Myasthenia gravis with acute exacerbation (HCC) 05/15/2015   Osteoarthritis of left knee 12/02/2013   Osteoarthritis of right knee 08/30/2013   Pancreatitis 07/25/2017   Protein-calorie malnutrition, severe (HCC) 08/07/2015   Seasonal allergies    takes Zytrec   Shortness of breath    Sinusitis 02/25/2018   Sleep apnea    with cPAp machine   Tracheostomy status (HCC)    Tubular adenoma of colon    Tumors    "in my stomach"   Umbilical hernia    watching , no plans for surgery at present   Urine incontinence 10/19/2015   VAP (ventilator-associated pneumonia) (HCC)     Family History  Problem Relation Age of Onset   Heart disease Mother 75   Hypertension Mother    Stroke Father    Hypertension Father    Atopy Sister    Allergic rhinitis Sister  Other Sister        Esophageal strictures   Allergic rhinitis Sister    Anemia Sister    Leukemia Sister    Leukemia Sister    COPD Brother    Colon cancer Neg Hx     Past Surgical History:  Procedure Laterality Date   BIOPSY  07/05/2019   Procedure: BIOPSY;  Surgeon: Asencion Blacksmith, MD;  Location: [facility];  Service: Endoscopy;;   CESAREAN SECTION  1987; 1989   COLONOSCOPY WITH PROPOFOL  N/A 09/19/2013   Procedure: COLONOSCOPY WITH PROPOFOL ;  Surgeon: Asencion Blacksmith, MD;  Location: [facility];  Service: Endoscopy;  Laterality: N/A;   COLONOSCOPY WITH PROPOFOL  N/A 07/05/2019   Procedure: COLONOSCOPY WITH PROPOFOL ;  Surgeon: Asencion Blacksmith, MD;  Location: [facility];  Service: Endoscopy;  Laterality: N/A;   DILATION AND CURETTAGE OF UTERUS     ESOPHAGOGASTRODUODENOSCOPY N/A 08/10/2015   Procedure: ESOPHAGOGASTRODUODENOSCOPY (EGD);  Surgeon: Kenney Peacemaker, MD;  Location[facility] ENDOSCOPY;  Service: Endoscopy;  Laterality: N/A;   ESOPHAGOGASTRODUODENOSCOPY (EGD) WITH PROPOFOL  N/A 03/21/2015   Procedure: ESOPHAGOGASTRODUODENOSCOPY (EGD) WITH PROPOFOL ;  Surgeon: Kenney Peacemaker, MD;   Location: [facility] ENDOSCOPY;  Service: Endoscopy;  Laterality: N/A;   ESOPHAGOGASTRODUODENOSCOPY (EGD) WITH PROPOFOL  N/A 07/05/2019   Procedure: ESOPHAGOGASTRODUODENOSCOPY (EGD) WITH PROPOFOL ;  Surgeon: Asencion Blacksmith, MD;  Location: [facility];  Service: Endoscopy;  Laterality: N/A;   PARTIAL KNEE ARTHROPLASTY Right 08/30/2013   Procedure: RIGHT UNICOMPARTMENTAL KNEE;  Surgeon: Neville Barbone, MD;  Location: [facility] OR;  Service: Orthopedics;  Laterality: Right;   PARTIAL KNEE ARTHROPLASTY Left 12/02/2013   Procedure: LEFT KNEE UNI ARTHROPLASTY;  Surgeon: Neville Barbone, MD;  Location: [facility];  Service: Orthopedics;  Laterality: Left;   PEG PLACEMENT N/A 08/10/2015   Procedure: PERCUTANEOUS ENDOSCOPIC GASTROSTOMY (PEG) PLACEMENT;  Surgeon: Kenney Peacemaker, MD;  Location: [facility] ENDOSCOPY;  Service: Endoscopy;  Laterality: N/A;   POLYPECTOMY  07/05/2019   Procedure: POLYPECTOMY;  Surgeon: Asencion Blacksmith, MD;  Location: [facility];  Service: Endoscopy;;   TUBAL LIGATION  1989   VAGINAL HYSTERECTOMY  1990's?   "apparently took out one of my ovaries at the time too cause one's missing"   Social History   Occupational History   Occupation: disabled  Tobacco Use   Smoking status: Former    Current packs/day: 0.00    Average packs/day: 1.5 packs/day for 38.2 years (57.3 ttl pk-yrs)    Types: Cigarettes    Start date: 03/03/1974    Quit date: 05/14/2012    Years since quitting: 11.2    Passive exposure: Past   Smokeless tobacco: Never   Tobacco comments:    denies thoughts of restarting  Vaping Use   Vaping status: Former  Substance and Sexual Activity   Alcohol use: No    Alcohol/week: 0.0 standard drinks of alcohol   Drug use: No   Sexual activity: Not Currently    Birth control/protection: Surgical

## 2023-08-09 ENCOUNTER — Encounter: Payer: Self-pay | Admitting: Surgical

## 2023-08-09 ENCOUNTER — Encounter: Payer: Self-pay | Admitting: Family Medicine

## 2023-08-10 ENCOUNTER — Encounter: Payer: Self-pay | Admitting: Physician Assistant

## 2023-08-10 ENCOUNTER — Ambulatory Visit (INDEPENDENT_AMBULATORY_CARE_PROVIDER_SITE_OTHER): Admitting: Physician Assistant

## 2023-08-10 VITALS — BP 124/74 | HR 78 | Ht 60.0 in | Wt 311.0 lb

## 2023-08-10 DIAGNOSIS — R1310 Dysphagia, unspecified: Secondary | ICD-10-CM

## 2023-08-10 DIAGNOSIS — Z860100 Personal history of colon polyps, unspecified: Secondary | ICD-10-CM

## 2023-08-10 DIAGNOSIS — K219 Gastro-esophageal reflux disease without esophagitis: Secondary | ICD-10-CM | POA: Diagnosis not present

## 2023-08-10 DIAGNOSIS — L509 Urticaria, unspecified: Secondary | ICD-10-CM

## 2023-08-10 DIAGNOSIS — R1319 Other dysphagia: Secondary | ICD-10-CM

## 2023-08-10 DIAGNOSIS — E119 Type 2 diabetes mellitus without complications: Secondary | ICD-10-CM

## 2023-08-10 DIAGNOSIS — D509 Iron deficiency anemia, unspecified: Secondary | ICD-10-CM | POA: Diagnosis not present

## 2023-08-10 DIAGNOSIS — K8020 Calculus of gallbladder without cholecystitis without obstruction: Secondary | ICD-10-CM

## 2023-08-10 DIAGNOSIS — K760 Fatty (change of) liver, not elsewhere classified: Secondary | ICD-10-CM

## 2023-08-10 DIAGNOSIS — K7581 Nonalcoholic steatohepatitis (NASH): Secondary | ICD-10-CM | POA: Diagnosis not present

## 2023-08-10 DIAGNOSIS — Z6841 Body Mass Index (BMI) 40.0 and over, adult: Secondary | ICD-10-CM

## 2023-08-10 DIAGNOSIS — G7000 Myasthenia gravis without (acute) exacerbation: Secondary | ICD-10-CM

## 2023-08-10 DIAGNOSIS — Z7985 Long-term (current) use of injectable non-insulin antidiabetic drugs: Secondary | ICD-10-CM

## 2023-08-10 MED ORDER — NA SULFATE-K SULFATE-MG SULF 17.5-3.13-1.6 GM/177ML PO SOLN: 1.0000 | ORAL | 0 refills | Status: DC

## 2023-08-10 NOTE — Patient Instructions (Addendum)
 _______________________________________________________  If your blood pressure at your visit was 140/90 or greater, please contact your primary care physician to follow up on this.  _______________________________________________________  If you are age 61 or older, your body mass index should be between 23-30. Your Body mass index is 60.74 kg/m. If this is out of the aforementioned range listed, please consider follow up with your Primary Care Provider.  If you are age 44 or younger, your body mass index should be between 19-25. Your Body mass index is 60.74 kg/m. If this is out of the aformentioned range listed, please consider follow up with your Primary Care Provider.   ________________________________________________________  The [facility] providers would like to encourage you to use MYCHART to communicate with providers for non-urgent requests or questions.  Due to long hold times on the telephone, sending your provider a message by [facility] may be a faster and more efficient way to get a response.  Please allow 48 business hours for a response.  Please remember that this is for non-urgent requests.  _______________________________________________________  Frances Medina have been scheduled for an endoscopy and colonoscopy. Please follow the written instructions given to you at your visit today.  If you use inhalers (even only as needed), please bring them with you on the day of your procedure.  DO NOT TAKE 7 DAYS PRIOR TO TEST- Trulicity (dulaglutide) Ozempic, Wegovy (semaglutide) Mounjaro (tirzepatide) Bydureon Bcise (exanatide extended release)  DO NOT TAKE 1 DAY PRIOR TO YOUR TEST Rybelsus (semaglutide) Adlyxin (lixisenatide) Victoza (liraglutide) Byetta (exanatide) ___________________________________________________________________________  Due to recent changes in healthcare laws, you may see the results of your imaging and laboratory studies on MyChart before your provider has  had a chance to review them.  We understand that in some cases there may be results that are confusing or concerning to you. Not all laboratory results come back in the same time frame and the provider may be waiting for multiple results in order to interpret others.  Please give us  48 hours in order for your provider to thoroughly review all the results before contacting the office for clarification of your results.   Thank you for entrusting me with your care and choosing [facility].  Santina Cull, PA-C

## 2023-08-10 NOTE — Progress Notes (Signed)
 I agree with the assessment and plan as outlined by Ms. Steffanie Dunn.

## 2023-08-12 ENCOUNTER — Ambulatory Visit (INDEPENDENT_AMBULATORY_CARE_PROVIDER_SITE_OTHER)

## 2023-08-12 ENCOUNTER — Ambulatory Visit (HOSPITAL_COMMUNITY)
Admission: EM | Admit: 2023-08-12 | Discharge: 2023-08-12 | Disposition: A | Discharge status: Home / Self Care | Attending: Internal Medicine | Admitting: Internal Medicine

## 2023-08-12 ENCOUNTER — Encounter (HOSPITAL_COMMUNITY): Payer: Self-pay

## 2023-08-12 DIAGNOSIS — R051 Acute cough: Secondary | ICD-10-CM

## 2023-08-12 DIAGNOSIS — J441 Chronic obstructive pulmonary disease with (acute) exacerbation: Secondary | ICD-10-CM | POA: Diagnosis not present

## 2023-08-12 DIAGNOSIS — T7840XA Allergy, unspecified, initial encounter: Secondary | ICD-10-CM | POA: Diagnosis not present

## 2023-08-12 MED ORDER — AZITHROMYCIN 250 MG PO TABS: 250.0000 mg | ORAL_TABLET | Freq: Every day | ORAL | 0 refills | Status: DC

## 2023-08-12 MED ORDER — DEXAMETHASONE SODIUM PHOSPHATE 10 MG/ML IJ SOLN
10.0000 mg | Freq: Once | INTRAMUSCULAR | Status: AC
  Administered 2023-08-12: 10 mg via INTRAMUSCULAR

## 2023-08-12 MED ORDER — DEXAMETHASONE SODIUM PHOSPHATE 10 MG/ML IJ SOLN
INTRAMUSCULAR | Status: AC
  Filled 2023-08-12: qty 1

## 2023-08-12 NOTE — Discharge Instructions (Signed)
 Your symptoms are due to a COPD exacerbation. We gave you a shot of dexamethasone  steroid in the clinic today which will help with your hives and your shortness of breath/coughing.  Take azithromycin  antibiotic as prescribed starting today.  Continue using daily inhaler as prescribed. Use albuterol  rescue inhaler as needed for shortness of breath and chest tightness.  Follow-up with primary care provider as needed for new/worsening symptoms and allergist as scheduled on August 20, 2023.  If you develop any new or worsening symptoms or if your symptoms do not start to improve, please return here or follow-up with your primary care provider. If your symptoms are severe, please go to the emergency room.

## 2023-08-12 NOTE — ED Provider Notes (Signed)
 [facility]    CSN: [id number] Arrival date & time: 08/12/23  1004      History   Chief Complaint Chief Complaint  Patient presents with   Shortness of Breath   Rash   Cough   Chills    HPI Frances Medina is a 63 y.o. female.   Patient with history of myasthenia gravis, COPD, Type 2 diabetes,  presents to urgent care for evaluation of cough, shortness of breath, and hives rash to the body that started 7 days ago.  Hives began a small red lesions to the hands and have now spread to the bilateral arms, legs, abdomen, back, and chest.  Cough is mostly dry and nonproductive.  She reports shortness of breath associated with coughing.  History of COPD, uses Advair inhaler daily, she has not needed to use her albuterol  inhaler during this illness.  She has had chills without known fever at home for the last few days.  She denies chest pain, heart palpitations, dizziness, leg swelling, nausea, vomiting, diarrhea, abdominal pain, and recent sick contacts with similar symptoms.  Former smoker, quit 10 years ago.  History of COPD.  No recent new exposures to foods, personal hygiene products, laundry detergents, bedding, or poisonous plants to her knowledge.  History of similar hives.  She denies throat closure sensation and sore throat.  No hives to the face.  No recent medication changes.  She has an appointment with her allergist in 8 days for follow-up and has been using over-the-counter anti-itch cream and famotidine  for hives without much relief.   Shortness of Breath Associated symptoms: cough and rash   Rash Associated symptoms: shortness of breath   Cough Associated symptoms: rash and shortness of breath     Past Medical History:  Diagnosis Date   Acute respiratory failure (HCC)    Acute respiratory failure with hypoxemia (HCC)    Aspiration into airway    Back pain 07/31/2017   Benign neoplasm of rectum    CAP (community acquired pneumonia) 08/05/2015   Depression  with anxiety    Diabetes mellitus, new onset (HCC) 06/02/2023   Diverticulosis    Dizziness 03/13/2023   Dysphagia 03/2015   EGD, Dr Willy Harvest. mild antral gastritis, ? due to Ibuprofen .  no stricture but empirically maloney dilated esophagus.    Dysphagia, neurologic    E. 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   GERD (gastroesophageal reflux disease)    hiatal hernia   Hypertension    went away when I stopped smoking   Knee injury    Left hip pain 02/18/2017   Migraine    maybe couple times/month (03/20/2015)   Mild intermittent asthma 06/22/2017   Myasthenia gravis (HCC) 2017   Myasthenia gravis with acute exacerbation (HCC) 05/15/2015   Osteoarthritis of left knee 12/02/2013   Osteoarthritis of right knee 08/30/2013   Pancreatitis 07/25/2017   Protein-calorie malnutrition, severe (HCC) 08/07/2015   Seasonal allergies    takes Zytrec   Shortness of breath    Sinusitis 02/25/2018   Sleep apnea    with cPAp machine   Tracheostomy status (HCC)    Tubular adenoma of colon    Tumors  in my stomach   Umbilical hernia    watching , no plans for surgery at present   Urine incontinence 10/19/2015   VAP (ventilator-associated pneumonia) [facility])     Patient Active Problem List   Diagnosis Date Noted   Left shoulder pain 07/06/2023   Diabetes mellitus, new onset (HCC) 06/02/2023   Hives 04/15/2023   Knee pain 03/13/2023   BMI 60.0-69.9, adult (HCC) 01/26/2023   Lumbar radiculopathy 12/03/2021   Positive ANA (antinuclear antibody) 08/26/2019   History of colonic polyps    Macromastia 06/17/2019   Seasonal allergies 02/18/2019   GAD (generalized anxiety disorder) 08/06/2018    Iron  deficiency anemia 07/31/2017   Osteoarthritis of left hip 02/18/2017   OSA on CPAP 09/11/2016   Restrictive lung disease 10/11/2015   Major depression    Headache, migraine    Splenomegaly 04/07/2015   Myasthenia gravis (HCC)    Osteoarthritis of left knee 12/02/2013   Benign neoplasm of descending colon 09/19/2013   Osteoarthritis of right knee 08/30/2013   GERD (gastroesophageal reflux disease) 06/23/2013   Fibroids 06/23/2013    Past Surgical History:  Procedure Laterality Date   BIOPSY  07/05/2019   Procedure: BIOPSY;  Surgeon: Asencion Blacksmith, MD;  Location: [facility];  Service: Endoscopy;;   CESAREAN SECTION  1987; 1989   COLONOSCOPY WITH PROPOFOL  N/A 09/19/2013   Procedure: COLONOSCOPY WITH PROPOFOL ;  Surgeon: Asencion Blacksmith, MD;  Location: [facility];  Service: Endoscopy;  Laterality: N/A;   COLONOSCOPY WITH PROPOFOL  N/A 07/05/2019   Procedure: COLONOSCOPY WITH PROPOFOL ;  Surgeon: Asencion Blacksmith, MD;  Location: [facility];  Service: Endoscopy;  Laterality: N/A;   DILATION AND CURETTAGE OF UTERUS     ESOPHAGOGASTRODUODENOSCOPY N/A 08/10/2015   Procedure: ESOPHAGOGASTRODUODENOSCOPY (EGD);  Surgeon: Kenney Peacemaker, MD;  Location: [facility] ENDOSCOPY;  Service: Endoscopy;  Laterality: N/A;   ESOPHAGOGASTRODUODENOSCOPY (EGD) WITH PROPOFOL  N/A 03/21/2015   Procedure: ESOPHAGOGASTRODUODENOSCOPY (EGD) WITH PROPOFOL ;  Surgeon: Kenney Peacemaker, MD;  Location: [facility] ENDOSCOPY;  Service: Endoscopy;  Laterality: N/A;   ESOPHAGOGASTRODUODENOSCOPY (EGD) WITH PROPOFOL  N/A 07/05/2019   Procedure: ESOPHAGOGASTRODUODENOSCOPY (EGD) WITH PROPOFOL ;  Surgeon: Asencion Blacksmith, MD;  Location: [facility];  Service: Endoscopy;  Laterality: N/A;   PARTIAL KNEE ARTHROPLASTY Right 08/30/2013   Procedure: RIGHT UNICOMPARTMENTAL KNEE;  Surgeon: Neville Barbone, MD;  Location: [facility];  Service: Orthopedics;  Laterality: Right;   PARTIAL KNEE ARTHROPLASTY Left 12/02/2013   Procedure: LEFT KNEE UNI ARTHROPLASTY;   Surgeon: Neville Barbone, MD;  Location: [facility];  Service: Orthopedics;  Laterality: Left;   PEG PLACEMENT N/A 08/10/2015   Procedure: PERCUTANEOUS ENDOSCOPIC GASTROSTOMY (PEG) PLACEMENT;  Surgeon: Kenney Peacemaker, MD;  Location: [facility] ENDOSCOPY;  Service: Endoscopy;  Laterality: N/A;   POLYPECTOMY  07/05/2019   Procedure: POLYPECTOMY;  Surgeon: Asencion Blacksmith, MD;  Location: [facility];  Service: Endoscopy;;   TUBAL LIGATION  1989   VAGINAL HYSTERECTOMY  1990's?   apparently took out one of my ovaries at the time too cause one's missing    OB History   No obstetric history on file.      Home Medications    Prior to Admission medications   Medication Sig Start Date End Date Taking? Authorizing Provider  azithromycin  (ZITHROMAX ) 250 MG tablet Take 1 tablet (250 mg total) by mouth daily. Take first 2 tablets together, then 1 every day until finished. 08/12/23  Yes Starlene Eaton, FNP  Accu-Chek Softclix Lancets lancets Use as instructed  07/07/23   McDiarmid, Demetra Filter, MD  acetaminophen  (TYLENOL ) 500 MG tablet Take 500 mg by mouth in the morning and at bedtime.    [provider]  albuterol  (PROVENTIL ) (2.5 MG/3ML) 0.083% nebulizer solution Take 3 mLs (2.5 mg total) by nebulization every 6 (six) hours as needed for wheezing or shortness of breath. 04/01/23   Arn Lane, MD  albuterol  (VENTOLIN  HFA) 108 (90 Base) MCG/ACT inhaler INHALE 2 PUFFS INTO THE LUNGS EVERY 6 HOURS AS NEEDED FOR WHEEZE OR SHORTNESS OF BREATH 04/01/23   Arn Lane, MD  APPLE CIDER VINEGAR PO Take 1 capsule by mouth daily.    [provider]  Ascorbic Acid (VITAMIN C PO) Take 1,000 mg by mouth daily.    [provider]  betamethasone  dipropionate 0.05 % cream Apply topically 2 (two) times daily. 04/28/23   Arn Lane, MD  Blood Glucose Monitoring Suppl (ACCU-CHEK GUIDE ME) w/Device KIT Use as directed. 07/07/23   McDiarmid, Demetra Filter, MD  clonazePAM  (KLONOPIN )  0.5 MG tablet TAKE 1 TABLET BY MOUTH TWICE A DAY AS NEEDED FOR ANXIETY 05/25/23   Arn Lane, MD  Continuous Glucose Sensor (FREESTYLE LIBRE 3 PLUS SENSOR) MISC Change sensor every 15 days. 06/03/23   McDiarmid, Demetra Filter, MD  Cyanocobalamin  (VITAMIN B12 PO) Take 1 tablet by mouth daily.    [provider]  EPINEPHrine  0.3 mg/0.3 mL IJ SOAJ injection Inject 0.3 mg into the muscle as needed for anaphylaxis. 05/06/23   Tinnie Forehand, FNP  fexofenadine  (ALLEGRA ) 180 MG tablet Take 1 tablet (180 mg total) by mouth daily. 08/04/23   Arn Lane, MD  fluticasone  (FLONASE ) 50 MCG/ACT nasal spray Place 2 sprays into both nostrils daily. 06/30/23   Kandice Orleans, MD  glucose blood (ACCU-CHEK SMARTVIEW) test strip Use as instructed 07/07/23   McDiarmid, Demetra Filter, MD  Lancets Misc. (ACCU-CHEK SOFTCLIX LANCET DEV) KIT Use as directed 07/07/23   McDiarmid, Demetra Filter, MD  Menthol  10 MG LOZG Take 1 lozenge by mouth daily as needed (cough).    [provider]  metFORMIN (GLUCOPHAGE-XR) 500 MG 24 hr tablet Take 1,000 mg by mouth. 08/06/23   [provider]  montelukast  (SINGULAIR ) 10 MG tablet TAKE 1 TABLET BY MOUTH EVERYDAY AT BEDTIME 05/05/23   Penni Bowman T, MD  mycophenolate  (CELLCEPT ) 500 MG tablet Take 500 mg by mouth 2 (two) times daily. 01/15/22   [provider]  Na Sulfate-K Sulfate-Mg Sulfate concentrate (SUPREP BOWEL PREP KIT) 17.5-3.13-1.6 GM/177ML SOLN Take 1 kit (354 mLs total) by mouth as directed. 08/10/23   Edmonia Gottron, PA-C  nystatin  (MYCOSTATIN /NYSTOP ) powder APPLY TOPICALLY 3 TIMES DAILY AS NEEDED. 11/06/22   Arn Lane, MD  pantoprazole  (PROTONIX ) 40 MG tablet Take 1 tablet (40 mg total) by mouth daily. 06/10/23   Edmonia Gottron, PA-C  sertraline  (ZOLOFT ) 50 MG tablet TAKE 3 TABLETS BY MOUTH EVERY DAY 05/12/23   Arn Lane, MD  tirzepatide  (MOUNJARO ) 5 MG/0.5ML Pen Inject 5 mg into the skin once a week. 07/06/23   Arn Lane, MD  VITAMIN D ,  CHOLECALCIFEROL, PO Take 1 tablet by mouth daily. otc    [provider]  Darlyne El 250-50 MCG/ACT AEPB INHALE 1 PUFF INTO THE LUNGS IN THE MORNING AND AT BEDTIME. 07/29/23   Wilfredo Hanly, MD    Family History Family History  Problem Relation Age of Onset   Heart disease Mother 77   Hypertension Mother  Stroke Father    Hypertension Father    Atopy Sister    Allergic rhinitis Sister    Other Sister        Esophageal strictures   Allergic rhinitis Sister    Anemia Sister    Leukemia Sister    Leukemia Sister    COPD Brother    Colon cancer Neg Hx     Social History Social History   Tobacco Use   Smoking status: Former    Current packs/day: 0.00    Average packs/day: 1.5 packs/day for 38.2 years (57.3 ttl pk-yrs)    Types: Cigarettes    Start date: 03/03/1974    Quit date: 05/14/2012    Years since quitting: 11.2    Passive exposure: Past   Smokeless tobacco: Never   Tobacco comments:    denies thoughts of restarting  Vaping Use   Vaping status: Former  Substance Use Topics   Alcohol use: No    Alcohol/week: 0.0 standard drinks of alcohol   Drug use: No     Allergies   Ciprofloxacin , Naproxen , Shrimp [shellfish allergy ], Dicyclomine, Methocarbamol , Pineapple, and Morphine  and codeine    Review of Systems Review of Systems  Respiratory:  Positive for cough and shortness of breath.   Skin:  Positive for rash.  Per HPI   Physical Exam Triage Vital Signs ED Triage Vitals  Encounter Vitals Group     BP 08/12/23 1010 124/81     Systolic BP Percentile --      Diastolic BP Percentile --      Pulse Rate 08/12/23 1010 84     Resp 08/12/23 1010 20     Temp 08/12/23 1010 98.2 F (36.8 C)     Temp Source 08/12/23 1010 Oral     SpO2 08/12/23 1010 97 %     Weight --      Height --      Head Circumference --      Peak Flow --      Pain Score 08/12/23 1009 0     Pain Loc --      Pain Education --      Exclude from Growth Chart --    No data  found.  Updated Vital Signs BP 124/81 (BP Location: Right Arm)   Pulse 84   Temp 98.2 F (36.8 C) (Oral)   Resp 20   SpO2 97%   Visual Acuity Right Eye Distance:   Left Eye Distance:   Bilateral Distance:    Right Eye Near:   Left Eye Near:    Bilateral Near:     Physical Exam Vitals and nursing note reviewed.  Constitutional:      Appearance: She is not ill-appearing or toxic-appearing.  HENT:     Head: Normocephalic and atraumatic.     Right Ear: Hearing, tympanic membrane, ear canal and external ear normal.     Left Ear: Hearing, tympanic membrane, ear canal and external ear normal.     Nose: Nose normal.     Mouth/Throat:     Lips: Pink.     Mouth: Mucous membranes are moist. No injury or oral lesions.     Dentition: Normal dentition.     Tongue: No lesions.     Pharynx: Oropharynx is clear. Uvula midline. No pharyngeal swelling, oropharyngeal exudate, posterior oropharyngeal erythema, uvula swelling or postnasal drip.     Tonsils: No tonsillar exudate.   Eyes:     General: Lids are normal. Vision grossly intact. Gaze  aligned appropriately.     Extraocular Movements: Extraocular movements intact.     Conjunctiva/sclera: Conjunctivae normal.   Neck:     Trachea: Trachea and phonation normal.   Cardiovascular:     Rate and Rhythm: Normal rate and regular rhythm.     Heart sounds: Normal heart sounds, S1 normal and S2 normal.  Pulmonary:     Effort: Pulmonary effort is normal. No respiratory distress.     Breath sounds: Normal air entry. Decreased breath sounds present. No wheezing, rhonchi or rales.     Comments: Course/diminished breath sounds to upper and lower lung fields bilaterally without focal finding. Speaking in full sentences without difficulty.  Musculoskeletal:     Cervical back: Neck supple.  Lymphadenopathy:     Cervical: No cervical adenopathy.   Skin:    General: Skin is warm and dry.     Capillary Refill: Capillary refill takes less than 2  seconds.     Findings: Rash (Pruritic urticarial rash to the bilateral arms, trunk, back, and legs as seen in image below.) present.   Neurological:     General: No focal deficit present.     Mental Status: She is alert and oriented to person, place, and time. Mental status is at baseline.     Cranial Nerves: No dysarthria or facial asymmetry.   Psychiatric:        Mood and Affect: Mood normal.        Speech: Speech normal.        Behavior: Behavior normal.        Thought Content: Thought content normal.        Judgment: Judgment normal.    Left arm   Right arm    UC Treatments / Results  Labs (all labs ordered are listed, but only abnormal results are displayed) Labs Reviewed - No data to display  EKG   Radiology DG Chest 2 View Result Date: 08/12/2023 CLINICAL DATA:  Cough and congestion. Shortness of breath for a week EXAM: CHEST - 2 VIEW COMPARISON:  One-view chest x-ray 05/25/2023 FINDINGS: Stable cardiopericardial silhouette. No pneumothorax, effusion or edema. No consolidation. Only mild linear opacity left lung base. Atelectasis is favored. Films are under penetrated. Degenerative changes along the spine. IMPRESSION: New bandlike opacity at the left lung base. Favoring atelectasis. Recommend follow-up. Electronically Signed   By: Adrianna Horde M.D.   On: 08/12/2023 12:10    Procedures Procedures (including critical care time)  Medications Ordered in UC Medications  dexamethasone  (DECADRON ) injection 10 mg (has no administration in time range)    Initial Impression / Assessment and Plan / UC Course  I have reviewed the triage vital signs and the nursing notes.  Pertinent labs & imaging results that were available during my care of the patient were reviewed by me and considered in my medical decision making (see chart for details).   1. COPD exacerbation, acute cough Evaluation suggests COPD exacerbation.  History of myasthenia gravis.  Dexamethasone  10mg  IM  given in clinic, azithromycin  antibiotic ordered.  Imaging: Chest x-ray without signs of acute cardiopulmonary process per radiology re-read, I have individually reviewed and interpreted images and agree with radiology re-read.  Discussed PCP follow-up to discuss management of COPD further with PCP/pulmonologist.   2. Allergic reaction Hives consistent with allergic reaction of unknown cause. She has an appointment with her allergy  specialist in 8 days and plans on discussing hives outbreaks with them.  Dexamethasone  10mg  IM as stated above will help with  both inflammation to the lungs and allergic reaction rash.  Continue Pepcid  and PRN use of OTC anti-itch cream.   Counseled patient on potential for adverse effects with medications prescribed/recommended today, strict ER and return-to-clinic precautions discussed, patient verbalized understanding.    Final Clinical Impressions(s) / UC Diagnoses   Final diagnoses:  Acute cough  COPD exacerbation (HCC)  Allergic reaction, initial encounter     Discharge Instructions      Your symptoms are due to a COPD exacerbation. We gave you a shot of dexamethasone  steroid in the clinic today which will help with your hives and your shortness of breath/coughing.  Take azithromycin  antibiotic as prescribed starting today.  Continue using daily inhaler as prescribed. Use albuterol  rescue inhaler as needed for shortness of breath and chest tightness.  Follow-up with primary care provider as needed for new/worsening symptoms and allergist as scheduled on August 20, 2023.  If you develop any new or worsening symptoms or if your symptoms do not start to improve, please return here or follow-up with your primary care provider. If your symptoms are severe, please go to the emergency room.   ED Prescriptions     Medication Sig Dispense Auth. Provider   azithromycin  (ZITHROMAX ) 250 MG tablet Take 1 tablet (250 mg total) by mouth daily. Take first 2  tablets together, then 1 every day until finished. 6 tablet Starlene Eaton, FNP      PDMP not reviewed this encounter.   Starlene Eaton, [place] 08/14/23 1914

## 2023-08-12 NOTE — ED Triage Notes (Signed)
 Pt states she has chills and a cough x 1 week ago. Reports she also developed hives on Sunday night, the chills worsened.   Home interventions: Florasone and Benadryl .

## 2023-08-13 ENCOUNTER — Encounter: Payer: Self-pay | Admitting: Rehabilitative and Restorative Service Providers"

## 2023-08-13 ENCOUNTER — Ambulatory Visit (INDEPENDENT_AMBULATORY_CARE_PROVIDER_SITE_OTHER): Admitting: Rehabilitative and Restorative Service Providers"

## 2023-08-13 DIAGNOSIS — R293 Abnormal posture: Secondary | ICD-10-CM | POA: Diagnosis not present

## 2023-08-13 DIAGNOSIS — M25612 Stiffness of left shoulder, not elsewhere classified: Secondary | ICD-10-CM

## 2023-08-13 DIAGNOSIS — M6281 Muscle weakness (generalized): Secondary | ICD-10-CM | POA: Diagnosis not present

## 2023-08-13 DIAGNOSIS — M25512 Pain in left shoulder: Secondary | ICD-10-CM | POA: Diagnosis not present

## 2023-08-13 DIAGNOSIS — G8929 Other chronic pain: Secondary | ICD-10-CM

## 2023-08-13 NOTE — Therapy (Signed)
 OUTPATIENT PHYSICAL THERAPY SHOULDER EVALUATION   Patient Name: Frances Medina MRN: [id number] DOB:22-Sep-1960, 63 y.o., female Today's Date: 08/13/2023  END OF SESSION:  PT End of Session - 08/13/23 0934     Visit Number 1    Number of Visits 8    Date for PT Re-Evaluation 10/08/23    Authorization Type MEDICARE    Progress Note Due on Visit 8    PT Start Time 0845    PT Stop Time 0930    PT Time Calculation (min) 45 min    Activity Tolerance Patient tolerated treatment well;No increased pain    Behavior During Therapy [facility] for tasks assessed/performed          Past Medical History:  Diagnosis Date   Acute respiratory failure (HCC)    Acute respiratory failure with hypoxemia (HCC)    Aspiration into airway    Back pain 07/31/2017   Benign neoplasm of rectum    CAP (community acquired pneumonia) 08/05/2015   Depression with anxiety    Diabetes mellitus, new onset (HCC) 06/02/2023   Diverticulosis    Dizziness 03/13/2023   Dysphagia 03/2015   EGD, Dr Willy Harvest. mild antral gastritis, ? due to Ibuprofen .  no stricture but empirically maloney dilated esophagus.    Dysphagia, neurologic    E. 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   GERD (gastroesophageal reflux disease)    hiatal hernia   Hypertension    went away when I stopped smoking   Knee injury    Left hip pain 02/18/2017   Migraine    maybe couple times/month (03/20/2015)   Mild intermittent asthma 06/22/2017   Myasthenia gravis (HCC) 2017   Myasthenia gravis with acute exacerbation (HCC) 05/15/2015   Osteoarthritis of left knee 12/02/2013   Osteoarthritis of right knee 08/30/2013   Pancreatitis 07/25/2017    Protein-calorie malnutrition, severe (HCC) 08/07/2015   Seasonal allergies    takes Zytrec   Shortness of breath    Sinusitis 02/25/2018   Sleep apnea    with cPAp machine   Tracheostomy status (HCC)    Tubular adenoma of colon    Tumors    in my stomach   Umbilical hernia    watching , no plans for surgery at present   Urine incontinence 10/19/2015   VAP (ventilator-associated pneumonia) [facility])    Past Surgical History:  Procedure Laterality Date   BIOPSY  07/05/2019   Procedure: BIOPSY;  Surgeon: Asencion Blacksmith, MD;  Location: [facility] ENDOSCOPY;  Service: Endoscopy;;   CESAREAN SECTION  1987; 1989   COLONOSCOPY WITH PROPOFOL  N/A 09/19/2013   Procedure: COLONOSCOPY WITH PROPOFOL ;  Surgeon: Asencion Blacksmith, MD;  Location: [facility];  Service: Endoscopy;  Laterality: N/A;   COLONOSCOPY WITH PROPOFOL  N/A 07/05/2019   Procedure: COLONOSCOPY WITH PROPOFOL ;  Surgeon: Asencion Blacksmith, MD;  Location: [facility];  Service: Endoscopy;  Laterality: N/A;   DILATION AND CURETTAGE OF UTERUS     ESOPHAGOGASTRODUODENOSCOPY N/A 08/10/2015   Procedure: ESOPHAGOGASTRODUODENOSCOPY (EGD);  Surgeon: Kenney Peacemaker, MD;  Location[facility] ENDOSCOPY;  Service: Endoscopy;  Laterality: N/A;   ESOPHAGOGASTRODUODENOSCOPY (EGD) WITH PROPOFOL  N/A 03/21/2015   Procedure: ESOPHAGOGASTRODUODENOSCOPY (EGD) WITH PROPOFOL ;  Surgeon: Gorman Laughter  Tammie Fall, MD;  Location: [facility] ENDOSCOPY;  Service: Endoscopy;  Laterality: N/A;   ESOPHAGOGASTRODUODENOSCOPY (EGD) WITH PROPOFOL  N/A 07/05/2019   Procedure: ESOPHAGOGASTRODUODENOSCOPY (EGD) WITH PROPOFOL ;  Surgeon: Asencion Blacksmith, MD;  Location: [facility] ENDOS[facility];  Service: Endoscopy;  Laterality: N/A;   PARTIAL KNEE ARTHROPLASTY Right 08/30/2013   Procedure: RIGHT UNICOMPARTMENTAL KNEE;  Surgeon: Neville Barbone, MD;  Location: [facility] OR;  Service: Orthopedics;  Laterality: Right;   PARTIAL KNEE ARTHROPLASTY Left 12/02/2013   Procedure: LEFT KNEE UNI ARTHROPLASTY;  Surgeon: Neville Barbone, MD;   Location: [facility];  Service: Orthopedics;  Laterality: Left;   PEG PLACEMENT N/A 08/10/2015   Procedure: PERCUTANEOUS ENDOSCOPIC GASTROSTOMY (PEG) PLACEMENT;  Surgeon: Kenney Peacemaker, MD;  Location: [facility] ENDOSCOPY;  Service: Endoscopy;  Laterality: N/A;   POLYPECTOMY  07/05/2019   Procedure: POLYPECTOMY;  Surgeon: Asencion Blacksmith, MD;  Location: [facility];  Service: Endoscopy;;   TUBAL LIGATION  1989   VAGINAL HYSTERECTOMY  1990's?   apparently took out one of my ovaries at the time too cause one's missing   Patient Active Problem List   Diagnosis Date Noted   Left shoulder pain 07/06/2023   Diabetes mellitus, new onset (HCC) 06/02/2023   Hives 04/15/2023   Knee pain 03/13/2023   BMI 60.0-69.9, adult (HCC) 01/26/2023   Lumbar radiculopathy 12/03/2021   Positive ANA (antinuclear antibody) 08/26/2019   History of colonic polyps    Macromastia 06/17/2019   Seasonal allergies 02/18/2019   GAD (generalized anxiety disorder) 08/06/2018   Iron  deficiency anemia 07/31/2017   Osteoarthritis of left hip 02/18/2017   OSA on CPAP 09/11/2016   Restrictive lung disease 10/11/2015   Major depression    Headache, migraine    Splenomegaly 04/07/2015   Myasthenia gravis (HCC)    Osteoarthritis of left knee 12/02/2013   Benign neoplasm of descending colon 09/19/2013   Osteoarthritis of right knee 08/30/2013   GERD (gastroesophageal reflux disease) 06/23/2013   Fibroids 06/23/2013    PCP: Arn Lane, MD  REFERRING PROVIDER: Arn Lane, MD  REFERRING DIAG:  Diagnosis  M25.512 (ICD-10-CM) - Acute pain of left shoulder    THERAPY DIAG:  Muscle weakness (generalized)  Abnormal posture  Chronic left shoulder pain  Stiffness of left shoulder, not elsewhere classified  Rationale for Evaluation and Treatment: Rehabilitation  ONSET DATE: Frances Medina started noting left shoulder pain after a long bout of Covid in December of 2024.  SUBJECTIVE:  SUBJECTIVE STATEMENT: Frances Medina notes left shoulder pain that is primarily noticeable with reaching and overhead function.  Symptoms started back in December of last year during a long bout of COVID and symptoms have not improved since that time.  Hand dominance: Right  PERTINENT HISTORY: Myasthenia gravis, DM, Bil knee osteoarthritis, morbid obesity, GERD, anemia, shortness of breath, Bil partial knee replacements and depression/anxiety.  PAIN:  Are you having pain? Yes: NPRS scale: 2-6/10 this week Pain location: Lt shoulder, anterior and lateral shoulder Pain description: Achy, can be sharp with reaching  Aggravating factors: Reaching, overhead and impingement postures Relieving factors: Tylenol , ice  PRECAUTIONS: Shoulder  RED FLAGS: None   WEIGHT BEARING RESTRICTIONS: No  FALLS:  Has patient fallen in last 6 months? No  LIVING ENVIRONMENT: Lives with: lives with their family Lives in: House/apartment Stairs: Needs the handrail which is on the left at home Has following equipment at home: Single point cane  OCCUPATION: Disability  PLOF: Independent with community mobility with device  PATIENT GOALS: Be able to use the shoulder with normal function and without pain  NEXT MD VISIT: NA  OBJECTIVE:  Note: Objective measures were completed at Evaluation unless otherwise noted.  DIAGNOSTIC FINDINGS:  1. Mild glenohumeral and moderate acromioclavicular osteoarthritis. 2. Moderate distal lateral subacromial spurring. Possible loose body just inferior to the acromion.  PATIENT SURVEYS:  Patient-Specific Activity Scoring Scheme  0 represents "unable to perform." 10 represents "able to perform at prior level. 0 1 2 3 4 5 6 7 8 9  10 (Date and Score)   Activity Eval     1.  Walk long distances with a cane 6/10     2.  Cleaning 6/10    3.  Navigate stairs with a handrail 6/10   4.    5.    Score Average 6    Total score = sum of the activity scores/number of activities Minimum detectable change (90%CI) for average score = 2 points Minimum detectable change (90%CI) for single activity score = 3 points     COGNITION: Overall cognitive status: Within functional limits for tasks assessed     SENSATION: No complaints of peripheral pain or paresthesias  POSTURE: Significant for forward head, internally rotated and protracted shoulders  UPPER EXTREMITY ROM:   Passive ROM Left/Right 08/13/2023   Shoulder flexion 155/160   Shoulder extension    Shoulder abduction    Shoulder horizontal adduction 20/15   Shoulder internal rotation 60/60   Shoulder external rotation 65/70   Elbow flexion    Elbow extension    Wrist flexion    Wrist extension    Wrist ulnar deviation    Wrist radial deviation    Wrist pronation    Wrist supination    (Blank rows = not tested)  STRENGTH:  Assessed in pounds with hand-held dynamometer Left/Right 08/13/2023   Shoulder flexion    Shoulder extension    Shoulder abduction    Shoulder adduction    Shoulder internal rotation 20.6/25.7   Shoulder external rotation 15.0/14.2   Middle trapezius    Lower trapezius    Elbow flexion    Elbow extension    Wrist flexion    Wrist extension    Wrist ulnar deviation    Wrist radial deviation    Wrist pronation    Wrist supination    Grip strength (lbs)    (Blank rows = not tested)  TREATMENT DATE: 08/13/2023 Scapular protraction/supine arm raises 20 x 3# Scapular retraction 5 x 5 seconds Thera-Band ER Green 2 sets of 10 for 3 seconds with slow eccentrics Thera-Band IR Green 2 sets of 10 for 3 seconds with slow eccentrics  97535: Reviewed imaging and shoulder anatomy with the model;  discussed mechanism of impingement; examination findings and how capsular flexibility, scapular and rotator cuff strengthening should help her current situation   PATIENT EDUCATION: Education details: See above Person educated: Patient Education method: Explanation, Demonstration, Tactile cues, Verbal cues, and Handouts Education comprehension: verbalized understanding, returned demonstration, verbal cues required, tactile cues required, and needs further education  HOME EXERCISE PROGRAM: Access Code: [email or web address] URL: [email or web address] Date: 08/13/2023 Prepared by: Terral Ferrari  Exercises - Supine Scapular Protraction in Flexion with Dumbbells  - 2 x daily - 7 x weekly - 1 sets - 20-30 reps - 3 seconds hold - Standing Scapular Retraction  - 5 x daily - 7 x weekly - 1 sets - 5 reps - 5 second hold - Shoulder External Rotation with Anchored Resistance  - 2 x daily - 7 x weekly - 2 sets - 10 reps - 3 hold - Shoulder Internal Rotation with Resistance  - 2 x daily - 7 x weekly - 2 sets - 10 reps - 3 hold  ASSESSMENT:  CLINICAL IMPRESSION: Frances Medina presents to physical therapy with a diagnosis of  Diagnosis  M25.512 (ICD-10-CM) - Acute pain of left shoulder  She has a history of left shoulder pain dating back to a bout with COVID in December 2024.  She notes particular difficulty with reaching and overhead function.  Examination today showed capsular tightness, scapular rotator cuff weakness in need of skilled care.  Her prognosis to meet the below listed goals is good with the recommended plan of care.  OBJECTIVE IMPAIRMENTS: decreased activity tolerance, decreased endurance, decreased knowledge of condition, decreased ROM, decreased strength, decreased safety awareness, impaired perceived functional ability, impaired UE functional use, postural dysfunction, obesity, and pain.   ACTIVITY LIMITATIONS: carrying, lifting, standing, stairs, and reach over head  PARTICIPATION  LIMITATIONS: cleaning and community activity  PERSONAL FACTORS: Myasthenia gravis, DM, Bil knee osteoarthritis, morbid obesity, GERD, anemia, shortness of breath, Bil partial knee replacements and depression/anxiety are also affecting patient's functional outcome.   REHAB POTENTIAL: Good  CLINICAL DECISION MAKING: Stable/uncomplicated  EVALUATION COMPLEXITY: Low   GOALS: Goals reviewed with patient? Yes  SHORT TERM GOALS: Target date: 09/10/2023  Frances Medina will be independent with her day 1 home exercise program Baseline: Started 08/13/2023 Goal status: INITIAL  2.  Improve bilateral shoulder active range of motion for flexion to 170 degrees; external rotation 90 degrees; and horizontal adduction to 40 degrees Baseline: 155/160; 65/70 and 20/15 respectively Goal status: INITIAL   LONG TERM GOALS: Target date: 10/08/2023  Improve patient specific functional score to at least 8 Baseline: 6 Goal status: INITIAL  2.  Several report left shoulder pain no greater than 3/10 on the numeric pain rating scale Baseline: Can be 6/10 Goal status: INITIAL  3.  Improve bilateral shoulder ER strength to at least 18 pounds and IR strength to at least 28 pounds Baseline: 15.0/14.2 and 20.6/25.7 pounds respectively Goal status: INITIAL  4.  Frances Medina will be independent with her long-term maintenance home exercise program at discharge Baseline: Started 08/13/2023 Goal status: INITIAL  PLAN:  PT FREQUENCY: 1x/week  PT DURATION: 8 weeks  PLANNED INTERVENTIONS: 97750- Physical Performance Testing, 97110-Therapeutic exercises, 97530- Therapeutic activity, [id number]- Neuromuscular re-education, 97535-  Self Care, [id number]- Manual therapy, Patient/Family education, Joint mobilization, and Cryotherapy  PLAN FOR NEXT SESSION: Review day 1 home exercise program.  Implement appropriate capsular stretching to address active range of motion impairments addressed at evaluation.  Progress scapular and rotator cuff  strength as appropriate to meet long-term goals.   Joli Neas, PT, MPT 08/13/2023, 9:53 AM

## 2023-08-15 MED ORDER — HALOBETASOL PROPIONATE 0.05 % EX CREA: TOPICAL_CREAM | Freq: Two times a day (BID) | CUTANEOUS | 0 refills | Status: DC

## 2023-08-15 NOTE — Addendum Note (Signed)
 Addended by: Penni Bowman T on: 08/15/2023 04:00 PM   Modules accepted: Orders

## 2023-08-19 NOTE — Progress Notes (Signed)
   [address] [name] [address] Dept: [phone]  FOLLOW UP NOTE  Patient ID: Frances Medina, female    DOB: June 02, 1960  Age: 63 y.o. MRN: [id number] Date of Office Visit: 08/20/2023  Assessment  Chief Complaint: No chief complaint on file.  HPI Frances Medina is a 63 year old female who presents to the clinic for a follow up visit. She was last seen in this clinic on 06/30/2023 by Dr. Lydia Sams for evaluation of chronic urticaria, chronic rhinitis, and food allergies to shellfish, avocado and pineapple. Her last environmental allergy  testing on 03/14/2022 was negative to the environmental panel.  Her last food allergy  skin testing on 03/14/2022 was negative to shellfish, avocado, and pineapple.   She continues to follow up with pulmonology, Dr Diania Fortes, [facility], [place] [facility].    Discussed the use of AI scribe software for clinical note transcription with the patient, who gave verbal consent to proceed.  History of Present Illness      Drug Allergies:  Allergies  Allergen Reactions   Ciprofloxacin  Shortness Of Breath and Rash   Naproxen  Shortness Of Breath   Shrimp [Shellfish Allergy ] Hives and Shortness Of Breath    ER required   Dicyclomine Hives   Methocarbamol  Hives   Pineapple Itching    Itchy lips and tongue   Morphine  And Codeine  Other (See Comments)    Severe headache    Physical Exam: There were no vitals taken for this visit.   Physical Exam  Diagnostics:    Assessment and Plan: No diagnosis found.  No orders of the defined types were placed in this encounter.   There are no Patient Instructions on file for this visit.  No follow-ups on file.    Thank you for the opportunity to care for this patient.  Please do not hesitate to contact me with questions.  Marinus Sic, FNP [facility]

## 2023-08-19 NOTE — Patient Instructions (Signed)
 Chronic rhinitis Continue montelukast  10 mg once a day to control rhinitis Consider saline nasal rinses as needed for nasal symptoms. Use this before any medicated nasal sprays for best result Lab work has been ordered to help us  evaluate your environmental allergies.  We will call you when the result becomes available.  Chronic urticaria Chronic urticaria panel elevated at previous visit Take the least amount of medications while remaining hive free Allegra  (fexofenadine ) 180 mg twice a day and famotidine  (Pepcid ) 20 mg twice a day. If no symptoms for 7-14 days then decrease to. Allegra  (fexofenadine ) 180 mg twice a day and famotidine  (Pepcid ) 20 mg once a day.  If no symptoms for 7-14 days then decrease to. Allegra  (fexofenadine ) 180 mg twice a day.  If no symptoms for 7-14 days then decrease to. Allegra  (fexofenadine ) 180 mg once a day.  If your symptoms re-occur, begin a journal of events that occurred for up to 6 hours before your symptoms began including foods and beverages consumed, soaps or perfumes you had contact with, and medications.   Consider Xolair injections for control of hives.  Written information provided at today's visit.  Food allergy  Continue to avoid shellfish, pineapple, and avocado.  In case of an allergic reaction, take an antihistamine once every 24 hours and if life-threatening symptoms occur, inject with EpiPen  0.3 mg.   Call the clinic if this treatment plan is not working well for you  Follow up in 2 months or sooner if needed.

## 2023-08-20 ENCOUNTER — Ambulatory Visit (INDEPENDENT_AMBULATORY_CARE_PROVIDER_SITE_OTHER): Admitting: Family Medicine

## 2023-08-20 ENCOUNTER — Other Ambulatory Visit: Payer: Self-pay

## 2023-08-20 ENCOUNTER — Encounter: Payer: Self-pay | Admitting: Family Medicine

## 2023-08-20 VITALS — BP 120/80 | HR 85 | Temp 97.5°F | Resp 16 | Ht 60.63 in | Wt 307.1 lb

## 2023-08-20 DIAGNOSIS — L501 Idiopathic urticaria: Secondary | ICD-10-CM | POA: Diagnosis not present

## 2023-08-20 DIAGNOSIS — J310 Chronic rhinitis: Secondary | ICD-10-CM | POA: Insufficient documentation

## 2023-08-20 DIAGNOSIS — T781XXA Other adverse food reactions, not elsewhere classified, initial encounter: Secondary | ICD-10-CM | POA: Insufficient documentation

## 2023-08-20 DIAGNOSIS — T781XXD Other adverse food reactions, not elsewhere classified, subsequent encounter: Secondary | ICD-10-CM | POA: Diagnosis not present

## 2023-08-20 MED ORDER — FAMOTIDINE 20 MG PO TABS: 20.0000 mg | ORAL_TABLET | Freq: Two times a day (BID) | ORAL | 5 refills | Status: DC | PRN

## 2023-08-23 LAB — ALLERGENS, ZONE 2

## 2023-08-24 ENCOUNTER — Ambulatory Visit: Payer: Self-pay | Admitting: Family Medicine

## 2023-08-24 ENCOUNTER — Telehealth: Payer: Self-pay | Admitting: *Deleted

## 2023-08-24 NOTE — Telephone Encounter (Signed)
-----   Message from Arlean Mutter sent at 08/20/2023 10:23 AM EDT ----- Hi there Tallis Soledad, Can you please submit for Xolair for chronic urticaria for this patient? Thank you.

## 2023-08-24 NOTE — Telephone Encounter (Signed)
 Called patient and advised approval, copay card and submit for Darra to [employer]. Will reach out to patient once delivery set to make appt to start injs in clinic with one hour wait for initial dose

## 2023-08-24 NOTE — Progress Notes (Signed)
 Can you please let this patient know that her environmental testing was negative. Thank you

## 2023-08-25 NOTE — Telephone Encounter (Signed)
 Excellent.  Thank you

## 2023-08-26 ENCOUNTER — Encounter: Payer: Self-pay | Admitting: Rehabilitative and Restorative Service Providers"

## 2023-08-26 ENCOUNTER — Ambulatory Visit (INDEPENDENT_AMBULATORY_CARE_PROVIDER_SITE_OTHER): Admitting: Rehabilitative and Restorative Service Providers"

## 2023-08-26 DIAGNOSIS — R293 Abnormal posture: Secondary | ICD-10-CM | POA: Diagnosis not present

## 2023-08-26 DIAGNOSIS — M25512 Pain in left shoulder: Secondary | ICD-10-CM | POA: Diagnosis not present

## 2023-08-26 DIAGNOSIS — M25612 Stiffness of left shoulder, not elsewhere classified: Secondary | ICD-10-CM | POA: Diagnosis not present

## 2023-08-26 DIAGNOSIS — M6281 Muscle weakness (generalized): Secondary | ICD-10-CM

## 2023-08-26 DIAGNOSIS — G8929 Other chronic pain: Secondary | ICD-10-CM

## 2023-08-26 NOTE — Therapy (Signed)
 OUTPATIENT PHYSICAL THERAPY TREATMENT   Patient Name: Frances Medina MRN: [id number] DOB:19-Apr-1960, 63 y.o., female Today's Date: 08/26/2023  END OF SESSION:  PT End of Session - 08/26/23 1257     Visit Number 2    Number of Visits 8    Date for PT Re-Evaluation 10/08/23    Authorization Type MEDICARE / [place]    Progress Note Due on Visit 8    PT Start Time 1257    PT Stop Time 1338    PT Time Calculation (min) 41 min    Activity Tolerance Patient tolerated treatment well    Behavior During Therapy [facility] for tasks assessed/performed           Past Medical History:  Diagnosis Date   Acute respiratory failure (HCC)    Acute respiratory failure with hypoxemia (HCC)    Aspiration into airway    Back pain 07/31/2017   Benign neoplasm of rectum    CAP (community acquired pneumonia) 08/05/2015   Depression with anxiety    Diabetes mellitus, new onset (HCC) 06/02/2023   Diverticulosis    Dizziness 03/13/2023   Dysphagia 03/2015   EGD, Dr Avram. mild antral gastritis, ? due to Ibuprofen .  no stricture but empirically maloney dilated esophagus.    Dysphagia, neurologic    E. 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   GERD (gastroesophageal reflux disease)    hiatal hernia   Hypertension    went away when I stopped smoking   Knee injury    Left hip pain 02/18/2017   Migraine    maybe couple times/month (03/20/2015)   Mild intermittent asthma 06/22/2017   Myasthenia gravis (HCC) 2017   Myasthenia gravis with acute exacerbation (HCC) 05/15/2015   Osteoarthritis of left knee 12/02/2013   Osteoarthritis of right knee 08/30/2013   Pancreatitis 07/25/2017   Protein-calorie  malnutrition, severe (HCC) 08/07/2015   Seasonal allergies    takes Zytrec   Shortness of breath    Sinusitis 02/25/2018   Sleep apnea    with cPAp machine   Tracheostomy status (HCC)    Tubular adenoma of colon    Tumors    in my stomach   Umbilical hernia    watching , no plans for surgery at present   Urine incontinence 10/19/2015   Urticaria    VAP (ventilator-associated pneumonia) [facility])    Past Surgical History:  Procedure Laterality Date   BIOPSY  07/05/2019   Procedure: BIOPSY;  Surgeon: Aneita [name], MD;  Location: [facility] ENDOSCOPY;  Service: Endoscopy;;   CESAREAN SECTION  1987; 1989   COLONOSCOPY WITH PROPOFOL  N/A 09/19/2013   Procedure: COLONOSCOPY WITH PROPOFOL ;  Surgeon: [name] Aneita, MD;  Location: [facility];  Service: Endoscopy;  Laterality: N/A;   COLONOSCOPY WITH PROPOFOL  N/A 07/05/2019   Procedure: COLONOSCOPY WITH PROPOFOL ;  Surgeon: Aneita [name], MD;  Location: [facility];  Service: Endoscopy;  Laterality: N/A;   DILATION AND CURETTAGE OF UTERUS     ESOPHAGOGASTRODUODENOSCOPY N/A 08/10/2015   Procedure: ESOPHAGOGASTRODUODENOSCOPY (EGD);  Surgeon: Lupita FORBES Avram, MD;  Location: [facility] ENDOSCOPY;  Service: Endoscopy;  Laterality: N/A;   ESOPHAGOGASTRODUODENOSCOPY (EGD) WITH PROPOFOL  N/A 03/21/2015   Procedure: ESOPHAGOGASTRODUODENOSCOPY (EGD)  WITH PROPOFOL ;  Surgeon: Lupita FORBES Commander, MD;  Location: [facility]COPY;  Service: Endoscopy;  Laterality: N/A;   ESOPHAGOGASTRODUODENOSCOPY (EGD) WITH PROPOFOL  N/A 07/05/2019   Procedure: ESOPHAGOGASTRODUODENOSCOPY (EGD) WITH PROPOFOL ;  Surgeon: Aneita [name], MD;  Location: [facility];  Service: Endoscopy;  Laterality: N/A;   PARTIAL KNEE ARTHROPLASTY Right 08/30/2013   Procedure: RIGHT UNICOMPARTMENTAL KNEE;  Surgeon: Fonda SHAUNNA Olmsted, MD;  Location: [facility] OR;  Service: Orthopedics;  Laterality: Right;   PARTIAL KNEE ARTHROPLASTY Left 12/02/2013   Procedure: LEFT KNEE UNI ARTHROPLASTY;  Surgeon: Fonda SHAUNNA Olmsted, MD;   Location: [facility];  Service: Orthopedics;  Laterality: Left;   PEG PLACEMENT N/A 08/10/2015   Procedure: PERCUTANEOUS ENDOSCOPIC GASTROSTOMY (PEG) PLACEMENT;  Surgeon: Lupita FORBES Commander, MD;  Location: [facility] ENDOSCOPY;  Service: Endoscopy;  Laterality: N/A;   POLYPECTOMY  07/05/2019   Procedure: POLYPECTOMY;  Surgeon: Aneita [name], MD;  Location: [facility];  Service: Endoscopy;;   TUBAL LIGATION  1989   VAGINAL HYSTERECTOMY  1990's?   apparently took out one of my ovaries at the time too cause one's missing   Patient Active Problem List   Diagnosis Date Noted   Adverse food reaction 08/20/2023   Chronic rhinitis 08/20/2023   Left shoulder pain 07/06/2023   Diabetes mellitus, new onset (HCC) 06/02/2023   Idiopathic urticaria 04/15/2023   Knee pain 03/13/2023   BMI 60.0-69.9, adult (HCC) 01/26/2023   Lumbar radiculopathy 12/03/2021   Positive ANA (antinuclear antibody) 08/26/2019   History of colonic polyps    Macromastia 06/17/2019   Seasonal allergies 02/18/2019   GAD (generalized anxiety disorder) 08/06/2018   Iron  deficiency anemia 07/31/2017   Osteoarthritis of left hip 02/18/2017   OSA on CPAP 09/11/2016   Restrictive lung disease 10/11/2015   Major depression    Headache, migraine    Splenomegaly 04/07/2015   Myasthenia gravis (HCC)    Osteoarthritis of left knee 12/02/2013   Benign neoplasm of descending colon 09/19/2013   Osteoarthritis of right knee 08/30/2013   GERD (gastroesophageal reflux disease) 06/23/2013   Fibroids 06/23/2013    PCP: Otto DASEN Fairly, MD  REFERRING PROVIDER: Otto DASEN Fairly, MD  REFERRING DIAG:  Diagnosis  M25.512 (ICD-10-CM) - Acute pain of left shoulder    THERAPY DIAG:  Muscle weakness (generalized)  Abnormal posture  Chronic left shoulder pain  Stiffness of left shoulder, not elsewhere classified  Rationale for Evaluation and Treatment: Rehabilitation  ONSET DATE: December of 2024.  SUBJECTIVE:  SUBJECTIVE STATEMENT: Pt indicated having some pain at rest and and some with movement.  Pt indicated symptoms worsening comes and goes.   Pt indicated after HEP feeling better for a while but symptoms return.  Reported having allergy  reaction and having itchy body.    Hand dominance: Right  PERTINENT HISTORY: Myasthenia gravis, DM, Bil knee osteoarthritis, morbid obesity, GERD, anemia, shortness of breath, Bil partial knee replacements and depression/anxiety.  PAIN:  NPRS scale: up to 7/10 at worst in last 24 hours Pain location: Lt shoulder, anterior and lateral shoulder Pain description: Achy, can be sharp with reaching  Aggravating factors: Reaching, overhead and impingement postures Relieving factors: Tylenol , ice  PRECAUTIONS: Shoulder  RED FLAGS: None   WEIGHT BEARING RESTRICTIONS: No  FALLS:  Has patient fallen in last 6 months? No  LIVING ENVIRONMENT: Lives with: lives with their family Lives in: House/apartment Stairs: Needs the handrail which is on the left at home Has following equipment at home: Single point cane  OCCUPATION: Disability  PLOF: Independent with community mobility with device  PATIENT GOALS: Be able to use the shoulder with normal function and without pain  NEXT MD VISIT: NA  OBJECTIVE:  Note: Objective measures were completed at Evaluation unless otherwise noted.  DIAGNOSTIC FINDINGS:  1. Mild glenohumeral and moderate acromioclavicular osteoarthritis. 2. Moderate distal lateral subacromial spurring. Possible loose body just inferior to the acromion.  PATIENT SURVEYS:  Patient-Specific Activity Scoring Scheme  0 represents "unable to perform." 10 represents "able to perform at prior level. 0 1 2 3 4 5 6 7 8 9  10 (Date and Score)   Activity Eval  08/13/2023     1.  Walk long distances with a cane 6/10    2.  Cleaning 6/10    3.  Navigate stairs with a handrail 6/10   4.    5.    Score Average 6    Total score = sum of the activity scores/number of activities Minimum detectable change (90%CI) for average score = 2 points Minimum detectable change (90%CI) for single activity score = 3 points   COGNITION: 08/13/2023 Overall cognitive status: Within functional limits for tasks assessed     SENSATION: 08/13/2023 No complaints of peripheral pain or paresthesias  POSTURE: 08/13/2023 Significant for forward head, internally rotated and protracted shoulders  UPPER EXTREMITY ROM:   Passive ROM Left/Right 08/13/2023 Lt 08/26/2023 AROM in supine  Shoulder flexion 155/160 158 with end range pain  Shoulder extension    Shoulder abduction  140 with end range pain  Shoulder horizontal adduction 20/15   Shoulder internal rotation 60/60 68 In 45 deg abduction with end range pain  Shoulder external rotation 65/70 60 In 45 deg abduction  Elbow flexion    Elbow extension    Wrist flexion    Wrist extension    Wrist ulnar deviation    Wrist radial deviation    Wrist pronation    Wrist supination    (Blank rows = not tested)  STRENGTH:   Left/Right 08/13/2023 hand-held dynamometer   Shoulder flexion    Shoulder extension    Shoulder abduction    Shoulder adduction    Shoulder internal rotation 20.6/25.7   Shoulder external rotation 15.0/14.2   Middle trapezius    Lower trapezius    Elbow flexion    Elbow extension    Wrist flexion    Wrist extension    Wrist ulnar deviation    Wrist radial deviation    Wrist pronation  Wrist supination    Grip strength (lbs)    (Blank rows = not tested)                                                                                                                                             TREATMENT          DATE: 08/26/2023 Therex: Supine cross arm posterior capsule stretch Lt arm 15 sec x 3   Standing green band IR c towel under arm Lt 2  x15 Standing green band ER c towel under arm Lt 2 x 15 UBE fwd/back 3 mins each way with 1 min rest break lvl 2.0  Review of existing HEP with additions provided through log in update.   Neuro Re-ed (scapular movement coordination/control) Supine Lt shoulder protraction 5 sec hold 1 lb x 15 Standing green band rows c scapular retraction focus 2 x 15 Standing green band gh ext 2 x 15  Manual: Supine Lt shoulder g2-g3 inferior joint mobs in flexion, scaption and abduction.  Lt shoulder posterior glides c mobilization c movement ER/IR.     TREATMENT          DATE: 08/13/2023 Scapular protraction/supine arm raises 20 x 3# Scapular retraction 5 x 5 seconds Thera-Band ER Green 2 sets of 10 for 3 seconds with slow eccentrics Thera-Band IR Green 2 sets of 10 for 3 seconds with slow eccentrics  [id number]: Reviewed imaging and shoulder anatomy with the model; discussed mechanism of impingement; examination findings and how capsular flexibility, scapular and rotator cuff strengthening should help her current situation   PATIENT EDUCATION: 6/25/205 Education details: HEP updates Person educated: Patient Education method: Explanation, Demonstration, Tactile cues, Verbal cues, and Handouts Education comprehension: verbalized understanding, returned demonstration, verbal cues required, tactile cues required, and needs further education  HOME EXERCISE PROGRAM: Access Code: [email or web address] URL: [email or web address] Date: 08/26/2023 Prepared by: Ozell Silvan  Exercises - Standing Scapular Retraction  - 5 x daily - 7 x weekly - 1 sets - 5 reps - 5 second hold - Standing Shoulder Posterior Capsule Stretch (Mirrored)  - 2 x daily - 7 x weekly - 1 sets - 3 reps - 15 hold - Supine Scapular Protraction in Flexion with Dumbbells  - 2 x daily - 7 x weekly - 1 sets - 20-30 reps - 3 seconds hold - Shoulder External Rotation with Anchored Resistance   - 2 x daily - 7 x weekly - 2 sets - 10 reps - 3 hold - Shoulder Internal Rotation with Resistance  - 2 x daily - 7 x weekly - 2 sets - 10 reps - 3 hold - Standing Bilateral Low Shoulder Row with Anchored Resistance  - 1-2 x daily - 7 x weekly - 2-3 sets - 10-15 reps - Shoulder Extension with Resistance  - 1-2 x daily - 7 x weekly - 1-2 sets -  10-15 reps  ASSESSMENT:  CLINICAL IMPRESSION:  Pt to benefit from continued skilled PT services to improve scapular mobility control and functional strength for UE to improve reach and carry ability.  Mild gains were noted in select ROM recheck.  Joint mobility showed good inferior mobility.  Very mild posterior restriction as evident in rotation mobility.   OBJECTIVE IMPAIRMENTS: decreased activity tolerance, decreased endurance, decreased knowledge of condition, decreased ROM, decreased strength, decreased safety awareness, impaired perceived functional ability, impaired UE functional use, postural dysfunction, obesity, and pain.   ACTIVITY LIMITATIONS: carrying, lifting, standing, stairs, and reach over head  PARTICIPATION LIMITATIONS: cleaning and community activity  PERSONAL FACTORS: Myasthenia gravis, DM, Bil knee osteoarthritis, morbid obesity, GERD, anemia, shortness of breath, Bil partial knee replacements and depression/anxiety are also affecting patient's functional outcome.   REHAB POTENTIAL: Good  CLINICAL DECISION MAKING: Stable/uncomplicated  EVALUATION COMPLEXITY: Low   GOALS: Goals reviewed with patient? Yes  SHORT TERM GOALS: Target date: 09/10/2023  Trinisha will be independent with her day 1 home exercise program Baseline: Started 08/13/2023 Goal status: on going 08/26/2023  2.  Improve bilateral shoulder active range of motion for flexion to 170 degrees; external rotation 90 degrees; and horizontal adduction to 40 degrees Baseline: 155/160; 65/70 and 20/15 respectively Goal status: on going 08/26/2023   LONG TERM GOALS:  Target date: 10/08/2023  Improve patient specific functional score to at least 8 Baseline: 6 Goal status: INITIAL  2.  Several report left shoulder pain no greater than 3/10 on the numeric pain rating scale Baseline: Can be 6/10 Goal status: INITIAL  3.  Improve bilateral shoulder ER strength to at least 18 pounds and IR strength to at least 28 pounds Baseline: 15.0/14.2 and 20.6/25.7 pounds respectively Goal status: INITIAL  4.  Genevieve will be independent with her long-term maintenance home exercise program at discharge Baseline: Started 08/13/2023 Goal status: INITIAL  PLAN:  PT FREQUENCY: 1x/week  PT DURATION: 8 weeks  PLANNED INTERVENTIONS: 97750- Physical Performance Testing, 97110-Therapeutic exercises, 97530- Therapeutic activity, 97112- Neuromuscular re-education, 97535- Self Care, [id number]- Manual therapy, Patient/Family education, Joint mobilization, and Cryotherapy  PLAN FOR NEXT SESSION  Check full Lt shoulder MMT for updates for elevation.    Ozell Silvan, PT, DPT, OCS, ATC 08/26/23  1:35 PM

## 2023-08-27 ENCOUNTER — Ambulatory Visit (INDEPENDENT_AMBULATORY_CARE_PROVIDER_SITE_OTHER): Admitting: Pulmonary Disease

## 2023-08-27 ENCOUNTER — Encounter: Payer: Self-pay | Admitting: Pulmonary Disease

## 2023-08-27 VITALS — BP 118/64 | HR 84 | Ht 60.0 in | Wt 305.0 lb

## 2023-08-27 DIAGNOSIS — Z87891 Personal history of nicotine dependence: Secondary | ICD-10-CM | POA: Diagnosis not present

## 2023-08-27 DIAGNOSIS — L501 Idiopathic urticaria: Secondary | ICD-10-CM

## 2023-08-27 DIAGNOSIS — J4520 Mild intermittent asthma, uncomplicated: Secondary | ICD-10-CM | POA: Diagnosis not present

## 2023-08-27 DIAGNOSIS — G4733 Obstructive sleep apnea (adult) (pediatric): Secondary | ICD-10-CM

## 2023-08-27 NOTE — Patient Instructions (Addendum)
 Continue wixella 1 puff twice daily - rinse mouth out after each use  Use albuterol  inhaler 1-2 puffs every 4-6 hours as needed  We will schedule you for full pulmonary function tests  I am ok if you start Xolair for the urticaria/skin rash by the Allergy  team  We will try to obtain a CPAP download for review  Follow up in 6 months, call sooner if needed

## 2023-08-27 NOTE — Progress Notes (Signed)
 Synopsis: Referred in November 2023 for cough and shortness of breath by Otto Fairly, MD  Subjective:   PATIENT ID: Frances Medina GENDER: female DOB: 07/31/1960, MRN: [id number]  HPI  Chief Complaint  Patient presents with   Follow-up   Ashley Bultema is a 63 year old woman, former smoker with GERD, hypertension, OSA on CPAP, seasonal allergies, positive ANA in 2017, myasthenia gravis and asthma who returns to pulmonary clinic for cough and dyspnea.   Patient treated for exacerbation of her breathing at urgent care 08/12/23.  PFTs from April Allergy /Immunology visit show possible restrictive defect.   She experiences breathing difficulties, which improved with the daily use of a Wixela inhaler. No recent use of prednisone  or other steroids. 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 She uses a CPAP machine, which she suspects might contribute to her skin issues.  She has a widespread itchy and burning skin rash since early January, covering her entire body. Cold chills precede its appearance. Allegra  and various lotions provide only temporary relief. The rash began before starting Mounjaro  for weight management. Environmental allergy  testing returned negative results.  OV 05/27/23 She has been experiencing persistent wheezing since February 10, 2023, following an initial diagnosis of coronavirus and a subsequent diagnosis of RSV a couple days ago. The wheezing has not resolved despite treatment and continues to affect her daily life. She is currently on prednisone  40mg  daily. She experiences cramps, which she attributes to either the medication or insufficient water  intake. She is also using albuterol , Advair, and steroid breathing treatments.   Since December, she has experienced a significant increase in thirst and urination, with a noted blood sugar level of 345 during a recent ER visit. She has a history of taking  metformin for blood sugar management but was advised to stop temporarily. Her hemoglobin A1c in January was 5.9. No issues with blood sugars aside from the noted high level during the ER visit.   OV 08/13/22 Her cough has improved with the advair 115-21mcg 2 puffs twice day. She has been mostly using it as needed. No other complaints at this time.  Initial OV 01/20/22 PFTs in 2018 showed mild restrictive defect and significant bronchodilator response with out evidence of obstruction.   In 2017 she reports being in ICU and had trach placed and was diagnosed with myasthenia gravis. She is currently followed by [facility].   She reports intermittent cough with phlegm production that is clear in color. She does have sinus congestion and post nasal drainage. She has intermittent wheezing when the cough is bad. She is using as needed albuterol  inhaler and nebulizer treatments. She is not using these medications on a weekly basis. She also has headaches. She reports developing a rash on her arms and legs about 1 week ago. She is in a bariatric weight loss program and has lost 45lbs since last year after starting [facility] and bariatric diet.  She is not currently working and has been on disability since her critical illness in 2017. 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 She is the primary care taker for her mother-in-law. She quit smoking 10 years ago and smoked for 37 years, half a pack to 1 pack per day. She has 3 children, boy and 2 girls. Her youngest is being recruited to play basketball at [employer].  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   Past Medical History:  Diagnosis Date   Acute respiratory failure (HCC)    Acute respiratory failure with hypoxemia (HCC)    Aspiration into airway    Back pain 07/31/2017   Benign neoplasm of rectum    CAP (community acquired pneumonia) 08/05/2015   Depression with anxiety    Diabetes  mellitus, new onset (HCC) 06/02/2023   Diverticulosis    Dizziness 03/13/2023   Dysphagia 03/2015   EGD, Dr Avram. mild antral gastritis, ? due to Ibuprofen .  no stricture but empirically maloney dilated esophagus.    Dysphagia, neurologic    E. 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   GERD (gastroesophageal reflux disease)    hiatal hernia   Hypertension    went away when I stopped smoking   Knee injury    Left hip pain 02/18/2017   Migraine    maybe couple times/month (03/20/2015)   Mild intermittent asthma 06/22/2017   Myasthenia gravis (HCC) 2017   Myasthenia gravis with acute exacerbation (HCC) 05/15/2015   Osteoarthritis of left knee 12/02/2013   Osteoarthritis of right knee 08/30/2013   Pancreatitis 07/25/2017   Protein-calorie malnutrition, severe (HCC) 08/07/2015   Seasonal allergies    takes Zytrec   Shortness of breath    Sinusitis 02/25/2018   Sleep apnea    with cPAp machine   Tracheostomy status (HCC)    Tubular adenoma of colon    Tumors    in my stomach   Umbilical hernia    watching , no plans for surgery at present   Urine incontinence 10/19/2015   Urticaria    VAP (ventilator-associated pneumonia) (HCC)      Family History  Problem Relation Age of Onset   Heart disease Mother 27   Hypertension Mother    Stroke Father    Hypertension Father    Atopy Sister    Allergic rhinitis Sister    Other Sister        Esophageal strictures   Allergic rhinitis Sister    Anemia Sister    Leukemia Sister    Leukemia Sister    COPD Brother    Colon cancer Neg Hx      Social History   Socioeconomic History   Marital status: Married    Spouse name: Not on  file   Number of children: 3   Years of education: Not on file   Highest education level: 12th grade  Occupational History   Occupation: disabled  Tobacco Use   Smoking status: Former    Current packs/day: 0.00    Average packs/day: 1.5 packs/day for 38.2 years (57.3 ttl pk-yrs)    Types: Cigarettes    Start date: 03/03/1974    Quit date: 05/14/2012    Years since quitting: 11.2    Passive exposure: Past   Smokeless tobacco: Never   Tobacco comments:    denies thoughts of restarting  Vaping Use   Vaping status: Former  Substance and Sexual Activity   Alcohol use: No    Alcohol/week: 0.0 standard drinks of alcohol   Drug use: No   Sexual activity: Not Currently    Birth control/protection: Surgical  Other Topics Concern  Not on file  Social History Narrative   Work or School: [employer] schools - [employer] Situation: lives with husband and daughters      Spiritual Beliefs: Christian      Lifestyle: no regular exercise; trying to eat healthy      [facility] (09/11/16):   Originally from [facility]. Has always lived in [name]. No pets currently. Does have carpet in her home in her bedroom. No bird exposure. Previously had mold under her kitchen sink. No indoor plants. Previously was working in [employer].          Social Drivers of Corporate investment banker Strain: Low Risk  (04/06/2023)   Overall Financial Resource Strain (CARDIA)    Difficulty of Paying Living Expenses: Not hard at all  Food Insecurity: No Food Insecurity (05/01/2023)   Hunger Vital Sign    Worried About Running Out of Food in the Last Year: Never true    Ran Out of Food in the Last Year: Never true  Transportation Needs: No Transportation Needs (05/01/2023)   PRAPARE - Administrator, Civil Service (Medical): No    Lack of Transportation (Non-Medical): No  Physical Activity: Insufficiently Active (04/06/2023)   Exercise Vital Sign    Days of Exercise per Week: 3  days    Minutes of Exercise per Session: 40 min  Stress: No Stress Concern Present (04/06/2023)   [employer] of Occupational Health - Occupational Stress Questionnaire    Feeling of Stress : Not at all  Social Connections: Moderately Integrated (05/01/2023)   Social Connection and Isolation Panel    Frequency of Communication with Friends and Family: More than three times a week    Frequency of Social Gatherings with Friends and Family: More than three times a week    Attends Religious Services: More than 4 times per year    Active Member of [employer] or Organizations: No    Attends Banker Meetings: Never    Marital Status: Married  Catering manager Violence: Not At Risk (05/01/2023)   Humiliation, Afraid, Rape, and Kick questionnaire    Fear of Current or Ex-Partner: No    Emotionally Abused: No    Physically Abused: No    Sexually Abused: No     Allergies  Allergen Reactions   Ciprofloxacin  Shortness Of Breath and Rash   Naproxen  Shortness Of Breath   Shrimp [Shellfish Allergy ] Hives and Shortness Of Breath    ER required   Dicyclomine Hives   Methocarbamol  Hives   Pineapple Itching    Itchy lips and tongue   Morphine  And Codeine  Other (See Comments)    Severe headache     Outpatient Medications Prior to Visit  Medication Sig Dispense Refill   Accu-Chek Softclix Lancets lancets Use as instructed 100 each 12   acetaminophen  (TYLENOL ) 500 MG tablet Take 500 mg by mouth in the morning and at bedtime.     albuterol  (PROVENTIL ) (2.5 MG/3ML) 0.083% nebulizer solution Take 3 mLs (2.5 mg total) by nebulization every 6 (six) hours as needed for wheezing or shortness of breath. 150 mL 1   albuterol  (VENTOLIN  HFA) 108 (90 Base) MCG/ACT inhaler INHALE 2 PUFFS INTO THE LUNGS EVERY 6 HOURS AS NEEDED FOR WHEEZE OR SHORTNESS OF BREATH 18 g 3   APPLE CIDER VINEGAR PO Take 1 capsule by mouth daily.     Ascorbic Acid (VITAMIN C PO) Take 1,000 mg by mouth daily.  azithromycin   (ZITHROMAX ) 250 MG tablet Take 1 tablet (250 mg total) by mouth daily. Take first 2 tablets together, then 1 every day until finished. 6 tablet 0   Blood Glucose Monitoring Suppl (ACCU-CHEK GUIDE ME) w/Device KIT Use as directed. 1 kit 0   clonazePAM  (KLONOPIN ) 0.5 MG tablet TAKE 1 TABLET BY MOUTH TWICE A DAY AS NEEDED FOR ANXIETY 60 tablet 1   Continuous Glucose Sensor (FREESTYLE LIBRE 3 PLUS SENSOR) MISC Change sensor every 15 days. 2 each 11   Cyanocobalamin  (VITAMIN B12 PO) Take 1 tablet by mouth daily.     EPINEPHrine  0.3 mg/0.3 mL IJ SOAJ injection Inject 0.3 mg into the muscle as needed for anaphylaxis. 2 each 1   famotidine  (PEPCID ) 20 MG tablet Take 1 tablet (20 mg total) by mouth 2 (two) times daily as needed for heartburn or indigestion. 60 tablet 5   fexofenadine  (ALLEGRA ) 180 MG tablet Take 1 tablet (180 mg total) by mouth daily. 90 tablet 1   fluticasone  (FLONASE ) 50 MCG/ACT nasal spray Place 2 sprays into both nostrils daily. 16 g 5   glucose blood (ACCU-CHEK SMARTVIEW) test strip Use as instructed 100 each 12   halobetasol  (ULTRAVATE ) 0.05 % cream Apply topically 2 (two) times daily. 50 g 0   Lancets Misc. (ACCU-CHEK SOFTCLIX LANCET DEV) KIT Use as directed 1 kit 0   Menthol  10 MG LOZG Take 1 lozenge by mouth daily as needed (cough).     metFORMIN (GLUCOPHAGE-XR) 500 MG 24 hr tablet Take 1,000 mg by mouth.     montelukast  (SINGULAIR ) 10 MG tablet TAKE 1 TABLET BY MOUTH EVERYDAY AT BEDTIME 90 tablet 1   mycophenolate  (CELLCEPT ) 500 MG tablet Take 500 mg by mouth 2 (two) times daily.     Na Sulfate-K Sulfate-Mg Sulfate concentrate (SUPREP BOWEL PREP KIT) 17.5-3.13-1.6 GM/177ML SOLN Take 1 kit (354 mLs total) by mouth as directed. 324 mL 0   nystatin  (MYCOSTATIN /NYSTOP ) powder APPLY TOPICALLY 3 TIMES DAILY AS NEEDED. 15 g 1   pantoprazole  (PROTONIX ) 40 MG tablet Take 1 tablet (40 mg total) by mouth daily. 90 tablet 3   sertraline  (ZOLOFT ) 50 MG tablet TAKE 3 TABLETS BY MOUTH EVERY DAY  270 tablet 1   tirzepatide  (MOUNJARO ) 5 MG/0.5ML Pen Inject 5 mg into the skin once a week. 2 mL 1   VITAMIN D , CHOLECALCIFEROL, PO Take 1 tablet by mouth daily. otc     WIXELA INHUB 250-50 MCG/ACT AEPB INHALE 1 PUFF INTO THE LUNGS IN THE MORNING AND AT BEDTIME. 60 each 5   No facility-administered medications prior to visit.   Review of Systems  Constitutional:  Negative for chills, fever, malaise/fatigue and weight loss.  HENT:  Negative for congestion, sinus pain and sore throat.   Eyes: Negative.   Respiratory:  Positive for shortness of breath (with exertion). Negative for cough, hemoptysis, sputum production and wheezing.   Cardiovascular:  Negative for chest pain, palpitations, orthopnea, claudication and leg swelling.  Gastrointestinal:  Negative for abdominal pain, heartburn, nausea and vomiting.  Genitourinary: Negative.   Musculoskeletal:  Negative for joint pain and myalgias.  Skin:  Positive for itching and rash.  Neurological:  Negative for weakness.   Objective:   Vitals:   08/27/23 0819  BP: 118/64  Pulse: 84  SpO2: 98%  Weight: (!) 305 lb (138.3 kg)  Height: 5' (1.524 m)    Physical Exam Constitutional:      General: She is not in acute distress.    Appearance:  She is obese. She is not ill-appearing.  HENT:     Head: Normocephalic and atraumatic.   Eyes:     General: No scleral icterus.    Conjunctiva/sclera: Conjunctivae normal.    Cardiovascular:     Rate and Rhythm: Normal rate and regular rhythm.     Pulses: Normal pulses.     Heart sounds: Normal heart sounds. No murmur heard. Pulmonary:     Effort: Pulmonary effort is normal.     Breath sounds: No wheezing, rhonchi or rales.   Musculoskeletal:     Right lower leg: No edema.     Left lower leg: No edema.   Skin:    General: Skin is warm and dry.   Neurological:     General: No focal deficit present.     Mental Status: She is alert.    CBC    Component Value Date/Time   WBC 8.4  06/10/2023 0944   RBC 5.19 (H) 06/10/2023 0944   HGB 11.7 (L) 06/10/2023 0944   HGB 11.5 05/06/2023 1041   HGB 12.4 10/30/2015 1112   HCT 37.8 06/10/2023 0944   HCT 36.9 05/06/2023 1041   HCT 38.9 10/30/2015 1112   PLT 238.0 06/10/2023 0944   PLT 159 05/06/2023 1041   MCV 72.7 (L) 06/10/2023 0944   MCV 71 (L) 05/06/2023 1041   MCV 72.2 (L) 10/30/2015 1112   MCH 22.3 (L) 05/25/2023 0720   MCHC 31.1 06/10/2023 0944   RDW 19.6 (H) 06/10/2023 0944   RDW 16.9 (H) 05/06/2023 1041   RDW 19.0 (H) 10/30/2015 1112   LYMPHSABS 1.7 06/10/2023 0944   LYMPHSABS 1.6 05/06/2023 1041   LYMPHSABS 1.0 10/30/2015 1112   MONOABS 0.4 06/10/2023 0944   MONOABS 0.4 10/30/2015 1112   EOSABS 0.0 06/10/2023 0944   EOSABS 0.0 05/06/2023 1041   BASOSABS 0.1 06/10/2023 0944   BASOSABS 0.0 05/06/2023 1041   BASOSABS 0.0 10/30/2015 1112      Latest Ref Rng & Units 06/10/2023    9:44 AM 05/25/2023    7:34 AM 05/25/2023    7:20 AM  BMP  Glucose 70 - 99 mg/dL 898   654   BUN 6 - 23 mg/dL 9   19   Creatinine 9.59 - 1.20 mg/dL 9.35   9.22   Sodium 864 - 145 mEq/L 142  137  138   Potassium 3.5 - 5.1 mEq/L 3.6  3.9  3.8   Chloride 96 - 112 mEq/L 104   102   CO2 19 - 32 mEq/L 30   25   Calcium 8.4 - 10.5 mg/dL 9.1   9.2    Chest imaging: CXR 05/25/23 Normal heart size and mediastinal contours. No pleural fluid, interstitial edema or airspace disease. Visualized osseous structures appear intact.  CTA Chest 05/03/20 Cardiovascular: Satisfactory pulmonary artery opacification but there is bolus dispersion, artifact from body habitus, and multiple areas of streak artifact. No visible pulmonary embolism. Normal heart size. No pericardial effusion. Negative aorta.   Mediastinum/Nodes: Negative for adenopathy or mass.   Lungs/Pleura: Band of atelectasis at the bases, subsegmental. There is no edema, consolidation, effusion, or pneumothorax.   Upper Abdomen: No acute finding.   Musculoskeletal: Spondylosis  with bridging osteophytes in the lower thoracic spine.  PFT:    Latest Ref Rng & Units 10/27/2016    9:36 AM  PFT Results  FVC-Pre L 1.57   FVC-Predicted Pre % 66   FVC-Post L 1.66   FVC-Predicted Post % 70  Pre FEV1/FVC % % 83   Post FEV1/FCV % % 92   FEV1-Pre L 1.30   FEV1-Predicted Pre % 70   FEV1-Post L 1.52   DLCO uncorrected ml/min/mmHg 15.92   DLCO UNC% % 84   DLCO corrected ml/min/mmHg 15.64   DLCO COR %Predicted % 82   DLVA Predicted % 131   TLC L 3.49   TLC % Predicted % 78   RV % Predicted % 100     Labs:  Path:  Echo:  Heart Catheterization:  Sleep Study CPAP Titration 2021 Date of NPSG, Split Night or HST:   NPSG 11/19/15  AHI 10/ hr, desatturation to 86%, body weight 264 lbs    IMPRESSIONS - The optimal PAP pressure was 12 cm of water . - Central sleep apnea was not noted during this titration (CAI = 0.2/h). - Significant oxygen desaturations were not observed during this titration (min O2 = 90.0%). - No snoring was audible during this study. - No cardiac abnormalities were observed during this study. - Clinically significant periodic limb movements were not noted during this study. Arousals associated with PLMs were rare.  Assessment & Plan:   Mild intermittent asthma without complication - Plan: Pulmonary Function Test  OSA on CPAP  Idiopathic urticaria  Discussion: Risha Barretta is a 63 year old woman, former smoker with GERD, hypertension, OSA on CPAP, seasonal allergies, positive ANA in 2017, myasthenia gravis and asthma who returns to pulmonary clinic for cough and dyspnea.   Asthma - continue wixella 1 puff twice dialy - use albuterol  inhaler as needed  Possible Restrictive lung disease - Schedule full set of pulmonary function tests.  Obesity - Continue Mounjaro  for weight loss. - Encourage weight loss to improve respiratory function and reduce restrictive lung defect.  Idiopathic urticaria Chronic idiopathic urticaria with  persistent rash, itching, and burning. Allergy  testing negative, Allegra  ineffective. Xolair injections considered to alleviate symptoms. - Allergy /immunology considering Xolair injections for idiopathic urticaria.  Follow up in 6 months.   Dorn Chill, MD [facility] Office: [phone]   Current Outpatient Medications:    Accu-Chek Softclix Lancets lancets, Use as instructed, Disp: 100 each, Rfl: 12   acetaminophen  (TYLENOL ) 500 MG tablet, Take 500 mg by mouth in the morning and at bedtime., Disp: , Rfl:    albuterol  (PROVENTIL ) (2.5 MG/3ML) 0.083% nebulizer solution, Take 3 mLs (2.5 mg total) by nebulization every 6 (six) hours as needed for wheezing or shortness of breath., Disp: 150 mL, Rfl: 1   albuterol  (VENTOLIN  HFA) 108 (90 Base) MCG/ACT inhaler, INHALE 2 PUFFS INTO THE LUNGS EVERY 6 HOURS AS NEEDED FOR WHEEZE OR SHORTNESS OF BREATH, Disp: 18 g, Rfl: 3   APPLE CIDER VINEGAR PO, Take 1 capsule by mouth daily., Disp: , Rfl:    Ascorbic Acid (VITAMIN C PO), Take 1,000 mg by mouth daily., Disp: , Rfl:    azithromycin  (ZITHROMAX ) 250 MG tablet, Take 1 tablet (250 mg total) by mouth daily. Take first 2 tablets together, then 1 every day until finished., Disp: 6 tablet, Rfl: 0   Blood Glucose Monitoring Suppl (ACCU-CHEK GUIDE ME) w/Device KIT, Use as directed., Disp: 1 kit, Rfl: 0   clonazePAM  (KLONOPIN ) 0.5 MG tablet, TAKE 1 TABLET BY MOUTH TWICE A DAY AS NEEDED FOR ANXIETY, Disp: 60 tablet, Rfl: 1   Continuous Glucose Sensor (FREESTYLE LIBRE 3 PLUS SENSOR) MISC, Change sensor every 15 days., Disp: 2 each, Rfl: 11   Cyanocobalamin  (VITAMIN B12 PO), Take 1 tablet  by mouth daily., Disp: , Rfl:    EPINEPHrine  0.3 mg/0.3 mL IJ SOAJ injection, Inject 0.3 mg into the muscle as needed for anaphylaxis., Disp: 2 each, Rfl: 1   famotidine  (PEPCID ) 20 MG tablet, Take 1 tablet (20 mg total) by mouth 2 (two) times daily as needed for heartburn or indigestion., Disp: 60 tablet,  Rfl: 5   fexofenadine  (ALLEGRA ) 180 MG tablet, Take 1 tablet (180 mg total) by mouth daily., Disp: 90 tablet, Rfl: 1   fluticasone  (FLONASE ) 50 MCG/ACT nasal spray, Place 2 sprays into both nostrils daily., Disp: 16 g, Rfl: 5   glucose blood (ACCU-CHEK SMARTVIEW) test strip, Use as instructed, Disp: 100 each, Rfl: 12   halobetasol  (ULTRAVATE ) 0.05 % cream, Apply topically 2 (two) times daily., Disp: 50 g, Rfl: 0   Lancets Misc. (ACCU-CHEK SOFTCLIX LANCET DEV) KIT, Use as directed, Disp: 1 kit, Rfl: 0   Menthol  10 MG LOZG, Take 1 lozenge by mouth daily as needed (cough)., Disp: , Rfl:    metFORMIN (GLUCOPHAGE-XR) 500 MG 24 hr tablet, Take 1,000 mg by mouth., Disp: , Rfl:    montelukast  (SINGULAIR ) 10 MG tablet, TAKE 1 TABLET BY MOUTH EVERYDAY AT BEDTIME, Disp: 90 tablet, Rfl: 1   mycophenolate  (CELLCEPT ) 500 MG tablet, Take 500 mg by mouth 2 (two) times daily., Disp: , Rfl:    Na Sulfate-K Sulfate-Mg Sulfate concentrate (SUPREP BOWEL PREP KIT) 17.5-3.13-1.6 GM/177ML SOLN, Take 1 kit (354 mLs total) by mouth as directed., Disp: 324 mL, Rfl: 0   nystatin  (MYCOSTATIN /NYSTOP ) powder, APPLY TOPICALLY 3 TIMES DAILY AS NEEDED., Disp: 15 g, Rfl: 1   pantoprazole  (PROTONIX ) 40 MG tablet, Take 1 tablet (40 mg total) by mouth daily., Disp: 90 tablet, Rfl: 3   sertraline  (ZOLOFT ) 50 MG tablet, TAKE 3 TABLETS BY MOUTH EVERY DAY, Disp: 270 tablet, Rfl: 1   tirzepatide  (MOUNJARO ) 5 MG/0.5ML Pen, Inject 5 mg into the skin once a week., Disp: 2 mL, Rfl: 1   VITAMIN D , CHOLECALCIFEROL, PO, Take 1 tablet by mouth daily. otc, Disp: , Rfl:    WIXELA INHUB 250-50 MCG/ACT AEPB, INHALE 1 PUFF INTO THE LUNGS IN THE MORNING AND AT BEDTIME., Disp: 60 each, Rfl: 5

## 2023-08-28 ENCOUNTER — Encounter: Payer: Self-pay | Admitting: Pulmonary Disease

## 2023-08-31 ENCOUNTER — Ambulatory Visit (INDEPENDENT_AMBULATORY_CARE_PROVIDER_SITE_OTHER): Admitting: Physical Therapy

## 2023-08-31 ENCOUNTER — Encounter: Payer: Self-pay | Admitting: Physical Therapy

## 2023-08-31 DIAGNOSIS — G8929 Other chronic pain: Secondary | ICD-10-CM

## 2023-08-31 DIAGNOSIS — M25612 Stiffness of left shoulder, not elsewhere classified: Secondary | ICD-10-CM | POA: Diagnosis not present

## 2023-08-31 DIAGNOSIS — M6281 Muscle weakness (generalized): Secondary | ICD-10-CM | POA: Diagnosis not present

## 2023-08-31 DIAGNOSIS — M25512 Pain in left shoulder: Secondary | ICD-10-CM

## 2023-08-31 DIAGNOSIS — R293 Abnormal posture: Secondary | ICD-10-CM

## 2023-08-31 NOTE — Therapy (Signed)
 OUTPATIENT PHYSICAL THERAPY TREATMENT   Patient Name: APURVA REILY MRN: [id number] DOB:[birth year], 63 y.o., female Today's Date: 08/31/2023  END OF SESSION:  PT End of Session - 08/31/23 0934     Visit Number 3    Number of Visits 8    Date for PT Re-Evaluation 10/08/23    Authorization Type MEDICARE / [place]    Progress Note Due on Visit 8    PT Start Time 0926    PT Stop Time 1004    PT Time Calculation (min) 38 min    Activity Tolerance Patient tolerated treatment well    Behavior During Therapy [facility] for tasks assessed/performed            Past Medical History:  Diagnosis Date   Acute respiratory failure (HCC)    Acute respiratory failure with hypoxemia (HCC)    Aspiration into airway    Back pain 07/31/2017   Benign neoplasm of rectum    CAP (community acquired pneumonia) 08/05/2015   Depression with anxiety    Diabetes mellitus, new onset (HCC) 06/02/2023   Diverticulosis    Dizziness 03/13/2023   Dysphagia 03/2015   EGD, Dr Avram. mild antral gastritis, ? due to Ibuprofen .  no stricture but empirically maloney dilated esophagus.    Dysphagia, neurologic    E. 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   GERD (gastroesophageal reflux disease)    hiatal hernia   Hypertension    went away when I stopped smoking   Knee injury    Left hip pain 02/18/2017   Migraine    maybe couple times/month (03/20/2015)   Mild intermittent asthma 06/22/2017   Myasthenia gravis (HCC) 2017   Myasthenia gravis with acute exacerbation (HCC) 05/15/2015   Osteoarthritis of left knee 12/02/2013   Osteoarthritis of right knee 08/30/2013   Pancreatitis 07/25/2017   Protein-calorie  malnutrition, severe (HCC) 08/07/2015   Seasonal allergies    takes Zytrec   Shortness of breath    Sinusitis 02/25/2018   Sleep apnea    with cPAp machine   Tracheostomy status (HCC)    Tubular adenoma of colon    Tumors    in my stomach   Umbilical hernia    watching , no plans for surgery at present   Urine incontinence 10/19/2015   Urticaria    VAP (ventilator-associated pneumonia) [facility])    Past Surgical History:  Procedure Laterality Date   BIOPSY  07/05/2019   Procedure: BIOPSY;  Surgeon: Aneita [name], MD;  Location: [facility] ENDOSCOPY;  Service: Endoscopy;;   CESAREAN SECTION  1987; 1989   COLONOSCOPY WITH PROPOFOL  N/A 09/19/2013   Procedure: COLONOSCOPY WITH PROPOFOL ;  Surgeon: [name] Aneita, MD;  Location: [facility];  Service: Endoscopy;  Laterality: N/A;   COLONOSCOPY WITH PROPOFOL  N/A 07/05/2019   Procedure: COLONOSCOPY WITH PROPOFOL ;  Surgeon: Aneita [name], MD;  Location: [facility];  Service: Endoscopy;  Laterality: N/A;   DILATION AND CURETTAGE OF UTERUS     ESOPHAGOGASTRODUODENOSCOPY N/A 08/10/2015   Procedure: ESOPHAGOGASTRODUODENOSCOPY (EGD);  Surgeon: Lupita FORBES Avram, MD;  Location: [facility] ENDOSCOPY;  Service: Endoscopy;  Laterality: N/A;   ESOPHAGOGASTRODUODENOSCOPY (EGD) WITH PROPOFOL  N/A 03/21/2015   Procedure: ESOPHAGOGASTRODUODENOSCOPY (  EGD) WITH PROPOFOL ;  Surgeon: Lupita FORBES Commander, MD;  Location: [facility] ENDOSCOPY;  Service: Endoscopy;  Laterality: N/A;   ESOPHAGOGASTRODUODENOSCOPY (EGD) WITH PROPOFOL  N/A 07/05/2019   Procedure: ESOPHAGOGASTRODUODENOSCOPY (EGD) WITH PROPOFOL ;  Surgeon: Aneita [name], MD;  Location: [facility];  Service: Endoscopy;  Laterality: N/A;   PARTIAL KNEE ARTHROPLASTY Right 08/30/2013   Procedure: RIGHT UNICOMPARTMENTAL KNEE;  Surgeon: Fonda SHAUNNA Olmsted, MD;  Location: [facility] OR;  Service: Orthopedics;  Laterality: Right;   PARTIAL KNEE ARTHROPLASTY Left 12/02/2013   Procedure: LEFT KNEE UNI ARTHROPLASTY;  Surgeon: Fonda SHAUNNA Olmsted, MD;   Location: [facility];  Service: Orthopedics;  Laterality: Left;   PEG PLACEMENT N/A 08/10/2015   Procedure: PERCUTANEOUS ENDOSCOPIC GASTROSTOMY (PEG) PLACEMENT;  Surgeon: Lupita FORBES Commander, MD;  Location: [facility] ENDOSCOPY;  Service: Endoscopy;  Laterality: N/A;   POLYPECTOMY  07/05/2019   Procedure: POLYPECTOMY;  Surgeon: Aneita [name], MD;  Location: [facility] ENDOSCOPY;  Service: Endoscopy;;   TUBAL LIGATION  1989   VAGINAL HYSTERECTOMY  1990's?   apparently took out one of my ovaries at the time too cause one's missing   Patient Active Problem List   Diagnosis Date Noted   Adverse food reaction 08/20/2023   Chronic rhinitis 08/20/2023   Left shoulder pain 07/06/2023   Diabetes mellitus, new onset (HCC) 06/02/2023   Idiopathic urticaria 04/15/2023   Knee pain 03/13/2023   BMI 60.0-69.9, adult (HCC) 01/26/2023   Lumbar radiculopathy 12/03/2021   Positive ANA (antinuclear antibody) 08/26/2019   History of colonic polyps    Macromastia 06/17/2019   Seasonal allergies 02/18/2019   GAD (generalized anxiety disorder) 08/06/2018   Iron  deficiency anemia 07/31/2017   Osteoarthritis of left hip 02/18/2017   OSA on CPAP 09/11/2016   Restrictive lung disease 10/11/2015   Major depression    Headache, migraine    Splenomegaly 04/07/2015   Myasthenia gravis (HCC)    Osteoarthritis of left knee 12/02/2013   Benign neoplasm of descending colon 09/19/2013   Osteoarthritis of right knee 08/30/2013   GERD (gastroesophageal reflux disease) 06/23/2013   Fibroids 06/23/2013    PCP: Otto DASEN Fairly, MD  REFERRING PROVIDER: Otto DASEN Fairly, MD  REFERRING DIAG:  Diagnosis  M25.512 (ICD-10-CM) - Acute pain of left shoulder    THERAPY DIAG:  Muscle weakness (generalized)  Abnormal posture  Chronic left shoulder pain  Stiffness of left shoulder, not elsewhere classified  Rationale for Evaluation and Treatment: Rehabilitation  ONSET DATE: December of 2024.  SUBJECTIVE:  SUBJECTIVE STATEMENT: Still dealing with hives and itching, her shoulder is doing okay though  Hand dominance: Right  PERTINENT HISTORY: Myasthenia gravis, DM, Bil knee osteoarthritis, morbid obesity, GERD, anemia, shortness of breath, Bil partial knee replacements and depression/anxiety.  PAIN:  NPRS scale: up to 4/10 at worst in last 24 hours Pain location: Lt shoulder, anterior and lateral shoulder Pain description: Achy, can be sharp with reaching  Aggravating factors: Reaching, overhead and impingement postures Relieving factors: Tylenol , ice  PRECAUTIONS: Shoulder  RED FLAGS: None   WEIGHT BEARING RESTRICTIONS: No  FALLS:  Has patient fallen in last 6 months? No  LIVING ENVIRONMENT: Lives with: lives with their family Lives in: House/apartment Stairs: Needs the handrail which is on the left at home Has following equipment at home: Single point cane  OCCUPATION: Disability  PLOF: Independent with community mobility with device  PATIENT GOALS: Be able to use the shoulder with normal function and without pain  NEXT MD VISIT: NA  OBJECTIVE:  Note: Objective measures were completed at Evaluation unless otherwise noted.  DIAGNOSTIC FINDINGS:  1. Mild glenohumeral and moderate acromioclavicular osteoarthritis. 2. Moderate distal lateral subacromial spurring. Possible loose body just inferior to the acromion.  PATIENT SURVEYS:  Patient-Specific Activity Scoring Scheme  0 represents "unable to perform." 10 represents "able to perform at prior level. 0 1 2 3 4 5 6 7 8 9  10 (Date and Score)   Activity Eval  08/13/2023    1.  Walk long distances with a cane 6/10    2.  Cleaning 6/10    3.  Navigate stairs with a handrail 6/10   Score Average 6    Total score = sum of the activity  scores/number of activities Minimum detectable change (90%CI) for average score = 2 points Minimum detectable change (90%CI) for single activity score = 3 points   COGNITION: 08/13/2023 Overall cognitive status: Within functional limits for tasks assessed     SENSATION: 08/13/2023 No complaints of peripheral pain or paresthesias  POSTURE: 08/13/2023 Significant for forward head, internally rotated and protracted shoulders  UPPER EXTREMITY ROM:   Passive ROM Left/Right 08/13/2023 Lt 08/26/2023 AROM in supine  Shoulder flexion 155/160 158 with end range pain  Shoulder extension    Shoulder abduction  140 with end range pain  Shoulder horizontal adduction 20/15   Shoulder internal rotation 60/60 68 In 45 deg abduction with end range pain  Shoulder external rotation 65/70 60 In 45 deg abduction  (Blank rows = not tested)  STRENGTH:   Left/Right 08/13/2023 hand-held dynamometer  Shoulder flexion   Shoulder extension   Shoulder abduction   Shoulder adduction   Shoulder internal rotation 20.6/25.7  Shoulder external rotation 15.0/14.2  (Blank rows = not tested)  TREATMENT 08/31/23 TherEx UBE fwd/back 3 mins each way with 1 min rest break; L4 Standing shoulder IR/ER with L4 band 2x15 on Lt Supine cross arm posterior capsule stretch Lt arm 20 sec x 3  Seated tricep extension 2# bil 2x15 Sidelying ER on Lt 2x15; 2#  Neuro Re-Ed Standing L4 band rows c scapular retraction focus 2 x 15; 3 sec hold Standing L4 shoulder ext 2x15, 3 sec hold Supine Lt shoulder protraction 5 sec hold 2#  2x15 Supine shoulder circles in 90 deg flexion x 15 CW/CCW  TherAct Standing shoulder flexion to 90 deg 2x15; 2# circuit Standing shoulder abduction to 90 deg 2x15, 2# bil, circuit Seated bicep curl into overhead press 2# bil; 2x15 Supine shoulder flexion  with 5 sec hold at end range 2#, 2x15    08/26/2023 Therex: Supine cross arm posterior capsule stretch Lt arm 15 sec x 3  Standing green band IR c towel under arm Lt 2  x15 Standing green band ER c towel under arm Lt 2 x 15 UBE fwd/back 3 mins each way with 1 min rest break lvl 2.0  Review of existing HEP with additions provided through log in update.   Neuro Re-ed (scapular movement coordination/control) Supine Lt shoulder protraction 5 sec hold 1 lb x 15 Standing green band rows c scapular retraction focus 2 x 15 Standing green band gh ext 2 x 15  Manual: Supine Lt shoulder g2-g3 inferior joint mobs in flexion, scaption and abduction.  Lt shoulder posterior glides c mobilization c movement ER/IR.     08/13/2023 Scapular protraction/supine arm raises 20 x 3# Scapular retraction 5 x 5 seconds Thera-Band ER Green 2 sets of 10 for 3 seconds with slow eccentrics Thera-Band IR Green 2 sets of 10 for 3 seconds with slow eccentrics  [id number]: Reviewed imaging and shoulder anatomy with the model; discussed mechanism of impingement; examination findings and how capsular flexibility, scapular and rotator cuff strengthening should help her current situation   PATIENT EDUCATION: 6/25/205 Education details: HEP updates Person educated: Patient Education method: Explanation, Demonstration, Tactile cues, Verbal cues, and Handouts Education comprehension: verbalized understanding, returned demonstration, verbal cues required, tactile cues required, and needs further education  HOME EXERCISE PROGRAM: Access Code: [email or web address] URL: [email or web address] Date: 08/26/2023 Prepared by: Ozell Silvan  Exercises - Standing Scapular Retraction  - 5 x daily - 7 x weekly - 1 sets - 5 reps - 5 second hold - Standing Shoulder Posterior Capsule Stretch (Mirrored)  - 2 x daily - 7 x weekly - 1 sets - 3 reps - 15 hold - Supine Scapular Protraction in Flexion with Dumbbells  - 2 x daily - 7 x  weekly - 1 sets - 20-30 reps - 3 seconds hold - Shoulder External Rotation with Anchored Resistance  - 2 x daily - 7 x weekly - 2 sets - 10 reps - 3 hold - Shoulder Internal Rotation with Resistance  - 2 x daily - 7 x weekly - 2 sets - 10 reps - 3 hold - Standing Bilateral Low Shoulder Row with Anchored Resistance  - 1-2 x daily - 7 x weekly - 2-3 sets - 10-15 reps - Shoulder Extension with Resistance  - 1-2 x daily - 7 x weekly - 1-2 sets - 10-15 reps  ASSESSMENT:  CLINICAL IMPRESSION:  Pt tolerated session well today with no pain at end of session and mainly focused on strengthening.  Will continue to benefit from PT to maximize function.    OBJECTIVE  IMPAIRMENTS: decreased activity tolerance, decreased endurance, decreased knowledge of condition, decreased ROM, decreased strength, decreased safety awareness, impaired perceived functional ability, impaired UE functional use, postural dysfunction, obesity, and pain.   ACTIVITY LIMITATIONS: carrying, lifting, standing, stairs, and reach over head  PARTICIPATION LIMITATIONS: cleaning and community activity  PERSONAL FACTORS: Myasthenia gravis, DM, Bil knee osteoarthritis, morbid obesity, GERD, anemia, shortness of breath, Bil partial knee replacements and depression/anxiety are also affecting patient's functional outcome.   REHAB POTENTIAL: Good  CLINICAL DECISION MAKING: Stable/uncomplicated  EVALUATION COMPLEXITY: Low   GOALS: Goals reviewed with patient? Yes  SHORT TERM GOALS: Target date: 09/10/2023  Emrys will be independent with her day 1 home exercise program Baseline: Started 08/13/2023 Goal status: on going 08/26/2023  2.  Improve bilateral shoulder active range of motion for flexion to 170 degrees; external rotation 90 degrees; and horizontal adduction to 40 degrees Baseline: 155/160; 65/70 and 20/15 respectively Goal status: on going 08/26/2023   LONG TERM GOALS: Target date: 10/08/2023  Improve patient specific  functional score to at least 8 Baseline: 6 Goal status: INITIAL  2.  Several report left shoulder pain no greater than 3/10 on the numeric pain rating scale Baseline: Can be 6/10 Goal status: INITIAL  3.  Improve bilateral shoulder ER strength to at least 18 pounds and IR strength to at least 28 pounds Baseline: 15.0/14.2 and 20.6/25.7 pounds respectively Goal status: INITIAL  4.  Yuritzy will be independent with her long-term maintenance home exercise program at discharge Baseline: Started 08/13/2023 Goal status: INITIAL  PLAN:  PT FREQUENCY: 1x/week  PT DURATION: 8 weeks  PLANNED INTERVENTIONS: 97750- Physical Performance Testing, 97110-Therapeutic exercises, 97530- Therapeutic activity, 97112- Neuromuscular re-education, 97535- Self Care, [id number]- Manual therapy, Patient/Family education, Joint mobilization, and Cryotherapy  PLAN FOR NEXT SESSION  MMT measurements, continue strengthening as tolerated, Check full Lt shoulder MMT for updates for elevation.    Corean JULIANNA Ku, PT, DPT 08/31/23 10:06 AM

## 2023-09-03 ENCOUNTER — Ambulatory Visit
Admission: RE | Admit: 2023-09-03 | Discharge: 2023-09-03 | Disposition: A | Discharge status: Home / Self Care | Source: Ambulatory Visit | Attending: Family Medicine | Admitting: Family Medicine

## 2023-09-03 DIAGNOSIS — Z1231 Encounter for screening mammogram for malignant neoplasm of breast: Secondary | ICD-10-CM

## 2023-09-07 ENCOUNTER — Other Ambulatory Visit: Payer: Self-pay | Admitting: Family Medicine

## 2023-09-07 ENCOUNTER — Ambulatory Visit (INDEPENDENT_AMBULATORY_CARE_PROVIDER_SITE_OTHER): Admitting: Physical Therapy

## 2023-09-07 ENCOUNTER — Encounter: Payer: Self-pay | Admitting: Physical Therapy

## 2023-09-07 DIAGNOSIS — M25512 Pain in left shoulder: Secondary | ICD-10-CM

## 2023-09-07 DIAGNOSIS — G8929 Other chronic pain: Secondary | ICD-10-CM

## 2023-09-07 DIAGNOSIS — M6281 Muscle weakness (generalized): Secondary | ICD-10-CM

## 2023-09-07 DIAGNOSIS — M25612 Stiffness of left shoulder, not elsewhere classified: Secondary | ICD-10-CM

## 2023-09-07 DIAGNOSIS — R293 Abnormal posture: Secondary | ICD-10-CM

## 2023-09-07 NOTE — Therapy (Signed)
 OUTPATIENT PHYSICAL THERAPY TREATMENT   Patient Name: Frances Medina MRN: [id number] DOB:December 15, 1960, 63 y.o., female Today's Date: 09/07/2023  END OF SESSION:  PT End of Session - 09/07/23 0802     Visit Number 4    Number of Visits 8    Date for PT Re-Evaluation 10/08/23    Authorization Type MEDICARE / [place]    Progress Note Due on Visit 8    PT Start Time 0800    PT Stop Time 0842    PT Time Calculation (min) 42 min    Activity Tolerance Patient tolerated treatment well    Behavior During Therapy [facility] for tasks assessed/performed             Past Medical History:  Diagnosis Date   Acute respiratory failure (HCC)    Acute respiratory failure with hypoxemia (HCC)    Aspiration into airway    Back pain 07/31/2017   Benign neoplasm of rectum    CAP (community acquired pneumonia) 08/05/2015   Depression with anxiety    Diabetes mellitus, new onset (HCC) 06/02/2023   Diverticulosis    Dizziness 03/13/2023   Dysphagia 03/2015   EGD, Dr Avram. mild antral gastritis, ? due to Ibuprofen .  no stricture but empirically maloney dilated esophagus.    Dysphagia, neurologic    E. 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   GERD (gastroesophageal reflux disease)    hiatal hernia   Hypertension    went away when I stopped smoking   Knee injury    Left hip pain 02/18/2017   Migraine    maybe couple times/month (03/20/2015)   Mild intermittent asthma 06/22/2017   Myasthenia gravis (HCC) 2017   Myasthenia gravis with acute exacerbation (HCC) 05/15/2015   Osteoarthritis of left knee 12/02/2013   Osteoarthritis of right knee 08/30/2013   Pancreatitis 07/25/2017   Protein-calorie  malnutrition, severe (HCC) 08/07/2015   Seasonal allergies    takes Zytrec   Shortness of breath    Sinusitis 02/25/2018   Sleep apnea    with cPAp machine   Tracheostomy status (HCC)    Tubular adenoma of colon    Tumors    in my stomach   Umbilical hernia    watching , no plans for surgery at present   Urine incontinence 10/19/2015   Urticaria    VAP (ventilator-associated pneumonia) [facility])    Past Surgical History:  Procedure Laterality Date   BIOPSY  07/05/2019   Procedure: BIOPSY;  Surgeon: Aneita [name], MD;  Location: [facility];  Service: Endoscopy;;   CESAREAN SECTION  1987; 1989   COLONOSCOPY WITH PROPOFOL  N/A 09/19/2013   Procedure: COLONOSCOPY WITH PROPOFOL ;  Surgeon: [name] Aneita, MD;  Location: [facility];  Service: Endoscopy;  Laterality: N/A;   COLONOSCOPY WITH PROPOFOL  N/A 07/05/2019   Procedure: COLONOSCOPY WITH PROPOFOL ;  Surgeon: Aneita [name], MD;  Location: [facility];  Service: Endoscopy;  Laterality: N/A;   DILATION AND CURETTAGE OF UTERUS     ESOPHAGOGASTRODUODENOSCOPY N/A 08/10/2015   Procedure: ESOPHAGOGASTRODUODENOSCOPY (EGD);  Surgeon: Lupita FORBES Avram, MD;  Location[facility] ENDOSCOPY;  Service: Endoscopy;  Laterality: N/A;   ESOPHAGOGASTRODUODENOSCOPY (EGD) WITH PROPOFOL  N/A 03/21/2015   Procedure:  ESOPHAGOGASTRODUODENOSCOPY (EGD) WITH PROPOFOL ;  Surgeon: Lupita FORBES Commander, MD;  Location: [facility] ENDOSCOPY;  Service: Endoscopy;  Laterality: N/A;   ESOPHAGOGASTRODUODENOSCOPY (EGD) WITH PROPOFOL  N/A 07/05/2019   Procedure: ESOPHAGOGASTRODUODENOSCOPY (EGD) WITH PROPOFOL ;  Surgeon: Aneita [name], MD;  Location: [facility];  Service: Endoscopy;  Laterality: N/A;   PARTIAL KNEE ARTHROPLASTY Right 08/30/2013   Procedure: RIGHT UNICOMPARTMENTAL KNEE;  Surgeon: Fonda SHAUNNA Olmsted, MD;  Location: [facility] OR;  Service: Orthopedics;  Laterality: Right;   PARTIAL KNEE ARTHROPLASTY Left 12/02/2013   Procedure: LEFT KNEE UNI ARTHROPLASTY;  Surgeon: Fonda SHAUNNA Olmsted, MD;   Location: [facility];  Service: Orthopedics;  Laterality: Left;   PEG PLACEMENT N/A 08/10/2015   Procedure: PERCUTANEOUS ENDOSCOPIC GASTROSTOMY (PEG) PLACEMENT;  Surgeon: Lupita FORBES Commander, MD;  Location: [facility] ENDOSCOPY;  Service: Endoscopy;  Laterality: N/A;   POLYPECTOMY  07/05/2019   Procedure: POLYPECTOMY;  Surgeon: Aneita [name], MD;  Location: [facility];  Service: Endoscopy;;   TUBAL LIGATION  1989   VAGINAL HYSTERECTOMY  1990's?   apparently took out one of my ovaries at the time too cause one's missing   Patient Active Problem List   Diagnosis Date Noted   Adverse food reaction 08/20/2023   Chronic rhinitis 08/20/2023   Left shoulder pain 07/06/2023   Diabetes mellitus, new onset (HCC) 06/02/2023   Idiopathic urticaria 04/15/2023   Knee pain 03/13/2023   BMI 60.0-69.9, adult (HCC) 01/26/2023   Lumbar radiculopathy 12/03/2021   Positive ANA (antinuclear antibody) 08/26/2019   History of colonic polyps    Macromastia 06/17/2019   Seasonal allergies 02/18/2019   GAD (generalized anxiety disorder) 08/06/2018   Iron  deficiency anemia 07/31/2017   Osteoarthritis of left hip 02/18/2017   OSA on CPAP 09/11/2016   Restrictive lung disease 10/11/2015   Major depression    Headache, migraine    Splenomegaly 04/07/2015   Myasthenia gravis (HCC)    Osteoarthritis of left knee 12/02/2013   Benign neoplasm of descending colon 09/19/2013   Osteoarthritis of right knee 08/30/2013   GERD (gastroesophageal reflux disease) 06/23/2013   Fibroids 06/23/2013    PCP: Otto DASEN Fairly, MD  REFERRING PROVIDER: Otto DASEN Fairly, MD  REFERRING DIAG:  Diagnosis  M25.512 (ICD-10-CM) - Acute pain of left shoulder    THERAPY DIAG:  Muscle weakness (generalized)  Abnormal posture  Chronic left shoulder pain  Stiffness of left shoulder, not elsewhere classified  Rationale for Evaluation and Treatment: Rehabilitation  ONSET DATE: December of 2024.  SUBJECTIVE:  SUBJECTIVE STATEMENT: No pain, doing well overall with shoulder  Hand dominance: Right  PERTINENT HISTORY: Myasthenia gravis, DM, Bil knee osteoarthritis, morbid obesity, GERD, anemia, shortness of breath, Bil partial knee replacements and depression/anxiety.  PAIN:  NPRS scale: 0/10 recently Pain location: Lt shoulder, anterior and lateral shoulder Pain description: Achy, can be sharp with reaching  Aggravating factors: Reaching, overhead and impingement postures Relieving factors: Tylenol , ice  PRECAUTIONS: Shoulder  RED FLAGS: None   WEIGHT BEARING RESTRICTIONS: No  FALLS:  Has patient fallen in last 6 months? No  LIVING ENVIRONMENT: Lives with: lives with their family Lives in: House/apartment Stairs: Needs the handrail which is on the left at home Has following equipment at home: Single point cane  OCCUPATION: Disability  PLOF: Independent with community mobility with device  PATIENT GOALS: Be able to use the shoulder with normal function and without pain  NEXT MD VISIT: NA  OBJECTIVE:  Note: Objective measures were completed at Evaluation unless otherwise noted.  DIAGNOSTIC FINDINGS:  1. Mild glenohumeral and moderate acromioclavicular osteoarthritis. 2. Moderate distal lateral subacromial spurring. Possible loose body just inferior to the acromion.  PATIENT SURVEYS:  Patient-Specific Activity Scoring Scheme  0 represents "unable to perform." 10 represents "able to perform at prior level. 0 1 2 3 4 5 6 7 8 9  10 (Date and Score)   Activity Eval  08/13/2023    1.  Walk long distances with a cane 6/10    2.  Cleaning 6/10    3.  Navigate stairs with a handrail 6/10   Score Average 6    Total score = sum of the activity scores/number of activities Minimum detectable change  (90%CI) for average score = 2 points Minimum detectable change (90%CI) for single activity score = 3 points   COGNITION: 08/13/2023 Overall cognitive status: Within functional limits for tasks assessed     SENSATION: 08/13/2023 No complaints of peripheral pain or paresthesias  POSTURE: 08/13/2023 Significant for forward head, internally rotated and protracted shoulders  UPPER EXTREMITY ROM:   Passive ROM Left/Right 08/13/2023 Lt 08/26/2023 AROM in supine  Shoulder flexion 155/160 158 with end range pain  Shoulder extension    Shoulder abduction  140 with end range pain  Shoulder horizontal adduction 20/15   Shoulder internal rotation 60/60 68 In 45 deg abduction with end range pain  Shoulder external rotation 65/70 60 In 45 deg abduction  (Blank rows = not tested)  STRENGTH:   Left/Right 08/13/2023 hand-held dynamometer Left 09/07/23  Shoulder flexion    Shoulder extension    Shoulder abduction    Shoulder adduction    Shoulder internal rotation 20.6/25.7 26.45#  Shoulder external rotation 15.0/14.2 17.6#  (Blank rows = not tested)  TREATMENT 09/07/23 TherEx UBE fwd/back 3 mins each way with 1 min rest break; L4 MMT - see above for details Shoulder IR/ER L4 band 2x10 Seated shoulder flexion/abduction circuit 2x10 with 2# weights bil Seated bicep curls 2x10; 2# Seated crossbody stretch 10 x 5 sec hold IR stretch with strap and Lt hand behind back 10x10 sec hold  TherAct Supine shoulder flexion with 5 sec hold at end range 2#, 2x10  Neuro Re-Ed Rows L4 band 2x10; 5 sec hold Shoulder extension 2x10; L4 band Seated L2 band drive the bus x 10 reps Supine shoulder circles x 20 reps CW/CCW in 90 deg flexion, 2# DM Supine shoulder protraction 2x10; 2# DB with 5 sec hold   08/31/23 TherEx UBE fwd/back 3 mins each way with 1 min  rest break; L4 Standing shoulder IR/ER with L4 band 2x15 on Lt Supine cross arm posterior capsule stretch Lt arm 20 sec x 3  Seated tricep extension 2# bil 2x15 Sidelying ER on Lt 2x15; 2#  Neuro Re-Ed Standing L4 band rows c scapular retraction focus 2 x 15; 3 sec hold Standing L4 shoulder ext 2x15, 3 sec hold Supine Lt shoulder protraction 5 sec hold 2#  2x15 Supine shoulder circles in 90 deg flexion x 15 CW/CCW  TherAct Standing shoulder flexion to 90 deg 2x15; 2# circuit Standing shoulder abduction to 90 deg 2x15, 2# bil, circuit Seated bicep curl into overhead press 2# bil; 2x15 Supine shoulder flexion with 5 sec hold at end range 2#, 2x15    08/26/2023 Therex: Supine cross arm posterior capsule stretch Lt arm 15 sec x 3  Standing green band IR c towel under arm Lt 2  x15 Standing green band ER c towel under arm Lt 2 x 15 UBE fwd/back 3 mins each way with 1 min rest break lvl 2.0  Review of existing HEP with additions provided through log in update.   Neuro Re-ed (scapular movement coordination/control) Supine Lt shoulder protraction 5 sec hold 1 lb x 15 Standing green band rows c scapular retraction focus 2 x 15 Standing green band gh ext 2 x 15  Manual: Supine Lt shoulder g2-g3 inferior joint mobs in flexion, scaption and abduction.  Lt shoulder posterior glides c mobilization c movement ER/IR.     08/13/2023 Scapular protraction/supine arm raises 20 x 3# Scapular retraction 5 x 5 seconds Thera-Band ER Green 2 sets of 10 for 3 seconds with slow eccentrics Thera-Band IR Green 2 sets of 10 for 3 seconds with slow eccentrics  [id number]: Reviewed imaging and shoulder anatomy with the model; discussed mechanism of impingement; examination findings and how capsular flexibility, scapular and rotator cuff strengthening should help her current situation   PATIENT EDUCATION: 6/25/205 Education details: HEP updates Person educated: Patient Education method: Explanation,  Demonstration, Tactile cues, Verbal cues, and Handouts Education comprehension: verbalized understanding, returned demonstration, verbal cues required, tactile cues required, and needs further education  HOME EXERCISE PROGRAM: Access Code: [email or web address] URL: [email or web address] Date: 08/26/2023 Prepared by: Ozell Silvan  Exercises - Standing Scapular Retraction  - 5 x daily - 7 x weekly - 1 sets - 5 reps - 5 second hold - Standing Shoulder Posterior Capsule Stretch (Mirrored)  - 2 x daily - 7 x weekly - 1 sets - 3 reps - 15 hold - Supine Scapular Protraction in Flexion with Dumbbells  - 2 x daily - 7 x weekly - 1 sets - 20-30 reps - 3 seconds hold - Shoulder External Rotation with Anchored Resistance  -  2 x daily - 7 x weekly - 2 sets - 10 reps - 3 hold - Shoulder Internal Rotation with Resistance  - 2 x daily - 7 x weekly - 2 sets - 10 reps - 3 hold - Standing Bilateral Low Shoulder Row with Anchored Resistance  - 1-2 x daily - 7 x weekly - 2-3 sets - 10-15 reps - Shoulder Extension with Resistance  - 1-2 x daily - 7 x weekly - 1-2 sets - 10-15 reps  ASSESSMENT:  CLINICAL IMPRESSION:  Pt reporting minimal pain at this time and has demonstrated improved strength in Lt shoulder.  Anticipate she will be ready for d/c next visit.    OBJECTIVE IMPAIRMENTS: decreased activity tolerance, decreased endurance, decreased knowledge of condition, decreased ROM, decreased strength, decreased safety awareness, impaired perceived functional ability, impaired UE functional use, postural dysfunction, obesity, and pain.   ACTIVITY LIMITATIONS: carrying, lifting, standing, stairs, and reach over head  PARTICIPATION LIMITATIONS: cleaning and community activity  PERSONAL FACTORS: Myasthenia gravis, DM, Bil knee osteoarthritis, morbid obesity, GERD, anemia, shortness of breath, Bil partial knee replacements and depression/anxiety are also affecting patient's functional outcome.   REHAB  POTENTIAL: Good  CLINICAL DECISION MAKING: Stable/uncomplicated  EVALUATION COMPLEXITY: Low   GOALS: Goals reviewed with patient? Yes  SHORT TERM GOALS: Target date: 09/10/2023  Yamil will be independent with her day 1 home exercise program Baseline: Started 08/13/2023 Goal status: on going 08/26/2023  2.  Improve bilateral shoulder active range of motion for flexion to 170 degrees; external rotation 90 degrees; and horizontal adduction to 40 degrees Baseline: 155/160; 65/70 and 20/15 respectively Goal status: on going 08/26/2023   LONG TERM GOALS: Target date: 10/08/2023  Improve patient specific functional score to at least 8 Baseline: 6 Goal status: INITIAL  2.  Several report left shoulder pain no greater than 3/10 on the numeric pain rating scale Baseline: Can be 6/10 Goal status: INITIAL  3.  Improve bilateral shoulder ER strength to at least 18 pounds and IR strength to at least 28 pounds Baseline: 15.0/14.2 and 20.6/25.7 pounds respectively Goal status: INITIAL  4.  Fartun will be independent with her long-term maintenance home exercise program at discharge Baseline: Started 08/13/2023 Goal status: INITIAL  PLAN:  PT FREQUENCY: 1x/week  PT DURATION: 8 weeks  PLANNED INTERVENTIONS: 97750- Physical Performance Testing, 97110-Therapeutic exercises, 97530- Therapeutic activity, 97112- Neuromuscular re-education, 97535- Self Care, 02859- Manual therapy, Patient/Family education, Joint mobilization, and Cryotherapy  PLAN FOR NEXT SESSION  plan for d/c, continue strengthening as tolerated   Corean JULIANNA Ku, PT, DPT 09/07/23 9:15 AM

## 2023-09-10 ENCOUNTER — Ambulatory Visit

## 2023-09-10 DIAGNOSIS — L501 Idiopathic urticaria: Secondary | ICD-10-CM | POA: Diagnosis not present

## 2023-09-10 MED ORDER — OMALIZUMAB 150 MG/ML ~~LOC~~ SOSY
300.0000 mg | PREFILLED_SYRINGE | SUBCUTANEOUS | Status: DC
  Administered 2023-09-10 – 2023-12-04 (×4): 300 mg via SUBCUTANEOUS

## 2023-09-10 NOTE — Progress Notes (Signed)
 Immunotherapy   Patient Details  Name: Frances Medina MRN: [id number] Date of Birth: 1960/10/15  09/10/2023  [name] started injections for  Xolair  Following schedule: Every twenty eight days.  Frequency:Every four weeks.  Epi-Pen:Epi-Pen Available  Consent signed in office today and patient instructions given. Patient waited in room twenty seven for sixty minutes.   After 45 minutes of waiting patient c/o feeling jittery. Vials were obtained 11:20- temp-97.8, BP- 120/90, O2- 98, HR-89, RR-16 and at 11:38- BP 122/88, O2-97, HR-82, RR-17. Patient was evaluated by Arlean and was doing well. Patient thinks she may have just been cold. Patient was stable at discharge.  [name] 09/10/2023, 9:40 AM

## 2023-09-11 ENCOUNTER — Ambulatory Visit (INDEPENDENT_AMBULATORY_CARE_PROVIDER_SITE_OTHER): Admitting: Family Medicine

## 2023-09-11 ENCOUNTER — Emergency Department (HOSPITAL_BASED_OUTPATIENT_CLINIC_OR_DEPARTMENT_OTHER)
Admission: EM | Admit: 2023-09-11 | Discharge: 2023-09-11 | Disposition: A | Discharge status: Home / Self Care | Attending: Emergency Medicine | Admitting: Emergency Medicine

## 2023-09-11 ENCOUNTER — Emergency Department (HOSPITAL_BASED_OUTPATIENT_CLINIC_OR_DEPARTMENT_OTHER)

## 2023-09-11 ENCOUNTER — Encounter (HOSPITAL_BASED_OUTPATIENT_CLINIC_OR_DEPARTMENT_OTHER): Payer: Self-pay

## 2023-09-11 ENCOUNTER — Encounter: Payer: Self-pay | Admitting: Family Medicine

## 2023-09-11 ENCOUNTER — Other Ambulatory Visit: Payer: Self-pay

## 2023-09-11 VITALS — BP 131/80 | HR 91 | Ht 60.0 in | Wt 307.2 lb

## 2023-09-11 DIAGNOSIS — Z79899 Other long term (current) drug therapy: Secondary | ICD-10-CM | POA: Insufficient documentation

## 2023-09-11 DIAGNOSIS — J45909 Unspecified asthma, uncomplicated: Secondary | ICD-10-CM | POA: Insufficient documentation

## 2023-09-11 DIAGNOSIS — R079 Chest pain, unspecified: Secondary | ICD-10-CM

## 2023-09-11 DIAGNOSIS — I10 Essential (primary) hypertension: Secondary | ICD-10-CM | POA: Diagnosis not present

## 2023-09-11 DIAGNOSIS — Z7951 Long term (current) use of inhaled steroids: Secondary | ICD-10-CM | POA: Insufficient documentation

## 2023-09-11 DIAGNOSIS — K219 Gastro-esophageal reflux disease without esophagitis: Secondary | ICD-10-CM | POA: Diagnosis not present

## 2023-09-11 DIAGNOSIS — Z7984 Long term (current) use of oral hypoglycemic drugs: Secondary | ICD-10-CM | POA: Insufficient documentation

## 2023-09-11 DIAGNOSIS — R0602 Shortness of breath: Secondary | ICD-10-CM | POA: Diagnosis not present

## 2023-09-11 DIAGNOSIS — N3000 Acute cystitis without hematuria: Secondary | ICD-10-CM | POA: Insufficient documentation

## 2023-09-11 DIAGNOSIS — R0789 Other chest pain: Secondary | ICD-10-CM

## 2023-09-11 DIAGNOSIS — E119 Type 2 diabetes mellitus without complications: Secondary | ICD-10-CM | POA: Diagnosis not present

## 2023-09-11 LAB — CBC WITH DIFFERENTIAL/PLATELET
Abs Immature Granulocytes: 0.05 K/uL (ref 0.00–0.07)
Basophils Absolute: 0 K/uL (ref 0.0–0.1)
Basophils Relative: 0 %
Eosinophils Absolute: 0 K/uL (ref 0.0–0.5)
Eosinophils Relative: 0 %
HCT: 36.8 % (ref 36.0–46.0)
Hemoglobin: 11.1 g/dL — ABNORMAL LOW (ref 12.0–15.0)
Immature Granulocytes: 1 %
Lymphocytes Relative: 20 %
Lymphs Abs: 2 K/uL (ref 0.7–4.0)
MCH: 22.1 pg — ABNORMAL LOW (ref 26.0–34.0)
MCHC: 30.2 g/dL (ref 30.0–36.0)
MCV: 73.2 fL — ABNORMAL LOW (ref 80.0–100.0)
Monocytes Absolute: 0.4 K/uL (ref 0.1–1.0)
Monocytes Relative: 4 %
Neutro Abs: 7.7 K/uL (ref 1.7–7.7)
Neutrophils Relative %: 75 %
Platelets: 284 K/uL (ref 150–400)
RBC: 5.03 MIL/uL (ref 3.87–5.11)
RDW: 19.4 % — ABNORMAL HIGH (ref 11.5–15.5)
WBC: 10.2 K/uL (ref 4.0–10.5)
nRBC: 0 % (ref 0.0–0.2)

## 2023-09-11 LAB — URINALYSIS, ROUTINE W REFLEX MICROSCOPIC
Bilirubin Urine: NEGATIVE
Glucose, UA: NEGATIVE mg/dL
Hgb urine dipstick: NEGATIVE
Ketones, ur: NEGATIVE mg/dL
Leukocytes,Ua: NEGATIVE
Nitrite: POSITIVE — AB
Protein, ur: NEGATIVE mg/dL
Specific Gravity, Urine: 1.023 (ref 1.005–1.030)
pH: 6.5 (ref 5.0–8.0)

## 2023-09-11 LAB — COMPREHENSIVE METABOLIC PANEL WITH GFR
ALT: 16 U/L (ref 0–44)
AST: 18 U/L (ref 15–41)
Albumin: 4.1 g/dL (ref 3.5–5.0)
Alkaline Phosphatase: 94 U/L (ref 38–126)
Anion gap: 11 (ref 5–15)
BUN: 13 mg/dL (ref 8–23)
CO2: 26 mmol/L (ref 22–32)
Calcium: 10 mg/dL (ref 8.9–10.3)
Chloride: 103 mmol/L (ref 98–111)
Creatinine, Ser: 0.75 mg/dL (ref 0.44–1.00)
GFR, Estimated: >60 mL/min (ref >60)
Glucose, Bld: 96 mg/dL (ref 70–99)
Potassium: 4 mmol/L (ref 3.5–5.1)
Sodium: 140 mmol/L (ref 135–145)
Total Bilirubin: 0.4 mg/dL (ref 0.0–1.2)
Total Protein: 7.2 g/dL (ref 6.5–8.1)

## 2023-09-11 LAB — RESP PANEL BY RT-PCR (RSV, FLU A&B, COVID)  RVPGX2
Influenza A by PCR: NEGATIVE
Influenza B by PCR: NEGATIVE
Resp Syncytial Virus by PCR: NEGATIVE
SARS Coronavirus 2 by RT PCR: NEGATIVE

## 2023-09-11 LAB — PRO BRAIN NATRIURETIC PEPTIDE: Pro Brain Natriuretic Peptide: 36.6 pg/mL (ref <300.0)

## 2023-09-11 LAB — TROPONIN T, HIGH SENSITIVITY: Troponin T High Sensitivity: <15 ng/L (ref <19)

## 2023-09-11 MED ORDER — CEPHALEXIN 250 MG PO CAPS
500.0000 mg | ORAL_CAPSULE | Freq: Once | ORAL | Status: AC
  Administered 2023-09-11: 500 mg via ORAL
  Filled 2023-09-11: qty 2

## 2023-09-11 MED ORDER — ESOMEPRAZOLE MAGNESIUM 40 MG PO CPDR: 40.0000 mg | DELAYED_RELEASE_CAPSULE | Freq: Every day | ORAL | 0 refills | Status: DC

## 2023-09-11 MED ORDER — IOHEXOL 350 MG/ML SOLN
100.0000 mL | Freq: Once | INTRAVENOUS | Status: AC | PRN
  Administered 2023-09-11: 100 mL via INTRAVENOUS

## 2023-09-11 MED ORDER — CEPHALEXIN 500 MG PO CAPS: 500.0000 mg | ORAL_CAPSULE | Freq: Three times a day (TID) | ORAL | 0 refills | Status: DC

## 2023-09-11 MED ORDER — ALUM & MAG HYDROXIDE-SIMETH 200-200-20 MG/5ML PO SUSP
30.0000 mL | Freq: Once | ORAL | Status: AC
  Administered 2023-09-11: 30 mL via ORAL
  Filled 2023-09-11: qty 30

## 2023-09-11 NOTE — Patient Instructions (Signed)
 It was wonderful to see you today.  Please bring ALL of your medications with you to every visit.   Today we talked about:  I recommend you be seen in the ED to rule out a blood clot causing your chest pain/coughing up blood.   Thank you for choosing [facility].   Please call [phone] with any questions about today's appointment.  Please arrive at least 15 minutes prior to your scheduled appointments.   If you had blood work today, I will send you a MyChart message or a letter if results are normal. Otherwise, I will give you a call.   If you had a referral placed, they will call you to set up an appointment. Please give us  a call if you don't hear back in the next 2 weeks.   If you need additional refills before your next appointment, please call your pharmacy first.   Rollene Keeling, MD  Family Medicine

## 2023-09-11 NOTE — ED Provider Notes (Signed)
 [place] EMERGENCY DEPARTMENT AT [facility] Provider Note   CSN: [id number] Arrival date & time: 09/11/23  1114     Patient presents with: Shortness of Breath and Chest Pain   Frances Medina is a 63 y.o. female.   Pt is a 63 yo female with pmhx significant for gerd, depression, fibroids, htn, migraines, arthritis, myasthenia gravis, uti, asthma, pancreatitis and dm.  Pt said she has been having left sided cp for about a week.  She's had significant issues with GERD and has been having increasing difficulty swallowing.  Dr. Federico ([facility]) is planning on a colonoscopy and endoscopy on 7/22.  Pt said she saw her doctor who sent her here to eval for PE.       Prior to Admission medications   Medication Sig Start Date End Date Taking? Authorizing Provider  cephALEXin  (KEFLEX ) 500 MG capsule Take 1 capsule (500 mg total) by mouth 3 (three) times daily. 09/11/23  Yes Dean Clarity, MD  esomeprazole  (NEXIUM ) 40 MG capsule Take 1 capsule (40 mg total) by mouth daily. 09/11/23  Yes Dean Clarity, MD  Accu-Chek Softclix Lancets lancets Use as instructed 07/07/23   McDiarmid, Krystal BIRCH, MD  acetaminophen  (TYLENOL ) 500 MG tablet Take 500 mg by mouth in the morning and at bedtime.    [provider]  albuterol  (PROVENTIL ) (2.5 MG/3ML) 0.083% nebulizer solution Take 3 mLs (2.5 mg total) by nebulization every 6 (six) hours as needed for wheezing or shortness of breath. 04/01/23   Anders Otto DASEN, MD  albuterol  (VENTOLIN  HFA) 108 (90 Base) MCG/ACT inhaler INHALE 2 PUFFS INTO THE LUNGS EVERY 6 HOURS AS NEEDED FOR WHEEZE OR SHORTNESS OF BREATH 04/01/23   Anders Otto DASEN, MD  APPLE CIDER VINEGAR PO Take 1 capsule by mouth daily.    [provider]  Ascorbic Acid (VITAMIN C PO) Take 1,000 mg by mouth daily.    [provider]  azithromycin  (ZITHROMAX ) 250 MG tablet Take 1 tablet (250 mg total) by mouth daily. Take first 2 tablets together, then 1 every day until  finished. 08/12/23   Enedelia Dorna HERO, FNP  Blood Glucose Monitoring Suppl (ACCU-CHEK GUIDE ME) w/Device KIT Use as directed. 07/07/23   McDiarmid, Krystal BIRCH, MD  clonazePAM  (KLONOPIN ) 0.5 MG tablet TAKE 1 TABLET BY MOUTH TWICE A DAY AS NEEDED FOR ANXIETY 05/25/23   Anders Otto DASEN, MD  Continuous Glucose Sensor (FREESTYLE LIBRE 3 PLUS SENSOR) MISC Change sensor every 15 days. 06/03/23   McDiarmid, Krystal BIRCH, MD  Cyanocobalamin  (VITAMIN B12 PO) Take 1 tablet by mouth daily.    [provider]  EPINEPHrine  0.3 mg/0.3 mL IJ SOAJ injection Inject 0.3 mg into the muscle as needed for anaphylaxis. 05/06/23   Cheryl Reusing, FNP  famotidine  (PEPCID ) 20 MG tablet Take 1 tablet (20 mg total) by mouth 2 (two) times daily as needed for heartburn or indigestion. 08/20/23   Cari Arlean HERO, FNP  fexofenadine  (ALLEGRA ) 180 MG tablet Take 1 tablet (180 mg total) by mouth daily. 08/04/23   Anders Otto DASEN, MD  fluticasone  (FLONASE ) 50 MCG/ACT nasal spray Place 2 sprays into both nostrils daily. 06/30/23   Tobie Arleta SQUIBB, MD  glucose blood (ACCU-CHEK SMARTVIEW) test strip Use as instructed 07/07/23   McDiarmid, Krystal BIRCH, MD  halobetasol  (ULTRAVATE ) 0.05 % cream Apply topically 2 (two) times daily. 08/15/23   Anders Otto DASEN, MD  Lancets Misc. (ACCU-CHEK SOFTCLIX LANCET DEV) KIT Use as directed 07/07/23   McDiarmid, Krystal  D, MD  Menthol  10 MG LOZG Take 1 lozenge by mouth daily as needed (cough).    [provider]  metFORMIN (GLUCOPHAGE-XR) 500 MG 24 hr tablet Take 1,000 mg by mouth. 08/06/23   [provider]  montelukast  (SINGULAIR ) 10 MG tablet TAKE 1 TABLET BY MOUTH EVERYDAY AT BEDTIME 05/05/23   Anders Cumins T, MD  mycophenolate  (CELLCEPT ) 500 MG tablet Take 500 mg by mouth 2 (two) times daily. 01/15/22   [provider]  Na Sulfate-K Sulfate-Mg Sulfate concentrate (SUPREP BOWEL PREP KIT) 17.5-3.13-1.6 GM/177ML SOLN Take 1 kit (354 mLs total) by mouth as directed. 08/10/23   Craig Palma R,  PA-C  nystatin  (MYCOSTATIN /NYSTOP ) powder APPLY TOPICALLY 3 TIMES DAILY AS NEEDED. 11/06/22   Anders Cumins DASEN, MD  sertraline  (ZOLOFT ) 50 MG tablet TAKE 3 TABLETS BY MOUTH EVERY DAY 05/12/23   Anders Cumins DASEN, MD  tirzepatide  (MOUNJARO ) 5 MG/0.5ML Pen Inject 5 mg into the skin once a week. 07/06/23   Anders Cumins DASEN, MD  VITAMIN D , CHOLECALCIFEROL, PO Take 1 tablet by mouth daily. otc    [provider]  NAPOLEON INHUB 250-50 MCG/ACT AEPB INHALE 1 PUFF INTO THE LUNGS IN THE MORNING AND AT BEDTIME. 07/29/23   Kara Dorn NOVAK, MD    Allergies: Ciprofloxacin , Naproxen , Shrimp [shellfish allergy ], Dicyclomine, Methocarbamol , Pineapple, and Morphine  and codeine     Review of Systems  Respiratory:  Positive for shortness of breath.   Cardiovascular:  Positive for chest pain.  All other systems reviewed and are negative.   Updated Vital Signs BP 116/62   Pulse 81   Temp 98.3 F (36.8 C) (Oral)   Resp 16   Ht 5' 1 (1.549 m)   Wt (!) 139.3 kg   SpO2 99%   BMI 58.03 kg/m   Physical Exam Vitals and nursing note reviewed.  Constitutional:      Appearance: She is well-developed. She is obese.  HENT:     Head: Normocephalic and atraumatic.     Mouth/Throat:     Mouth: Mucous membranes are moist.     Pharynx: Oropharynx is clear.  Eyes:     Extraocular Movements: Extraocular movements intact.     Pupils: Pupils are equal, round, and reactive to light.  Cardiovascular:     Rate and Rhythm: Normal rate and regular rhythm.  Pulmonary:     Effort: Pulmonary effort is normal.     Breath sounds: Normal breath sounds.  Abdominal:     General: Bowel sounds are normal.     Palpations: Abdomen is soft.  Musculoskeletal:        General: Normal range of motion.     Cervical back: Normal range of motion and neck supple.  Skin:    General: Skin is warm.     Capillary Refill: Capillary refill takes less than 2 seconds.  Neurological:     General: No focal deficit present.      Mental Status: She is alert and oriented to person, place, and time.  Psychiatric:        Mood and Affect: Mood normal.        Behavior: Behavior normal.     (all labs ordered are listed, but only abnormal results are displayed) Labs Reviewed  CBC WITH DIFFERENTIAL/PLATELET - Abnormal; Notable for the following components:      Result Value   Hemoglobin 11.1 (*)    MCV 73.2 (*)    MCH 22.1 (*)    RDW 19.4 (*)  All other components within normal limits  URINALYSIS, ROUTINE W REFLEX MICROSCOPIC - Abnormal; Notable for the following components:   Nitrite POSITIVE (*)    Bacteria, UA MANY (*)    All other components within normal limits  RESP PANEL BY RT-PCR (RSV, FLU A&B, COVID)  RVPGX2  URINE CULTURE  PRO BRAIN NATRIURETIC PEPTIDE  COMPREHENSIVE METABOLIC PANEL WITH GFR  TROPONIN T, HIGH SENSITIVITY  TROPONIN T, HIGH SENSITIVITY    EKG: EKG Interpretation Date/Time:  Friday September 11 2023 11:30:13 EDT Ventricular Rate:  87 PR Interval:  185 QRS Duration:  70 QT Interval:  359 QTC Calculation: 432 R Axis:   68  Text Interpretation: Sinus rhythm Atrial premature complexes Low voltage, precordial leads Minimal ST depression, inferior leads No significant change since last tracing Confirmed by Dean Clarity [phone]) on 09/11/2023 1:08:29 PM  Radiology: CT Angio Chest PE W and/or Wo Contrast Result Date: 09/11/2023 CLINICAL DATA:  Shortness of breath and chest pain for 1 week. EXAM: CT ANGIOGRAPHY CHEST WITH CONTRAST TECHNIQUE: Multidetector CT imaging of the chest was performed using the standard protocol during bolus administration of intravenous contrast. Multiplanar CT image reconstructions and MIPs were obtained to evaluate the vascular anatomy. RADIATION DOSE REDUCTION: This exam was performed according to the departmental dose-optimization program which includes automated exposure control, adjustment of the mA and/or kV according to patient size and/or use of iterative  reconstruction technique. CONTRAST:  OMNIPAQUE  IOHEXOL  350 MG/ML SOLN COMPARISON:  March 25, 2023. FINDINGS: Cardiovascular: Satisfactory opacification of the pulmonary arteries to the segmental level. No evidence of pulmonary embolism. Normal heart size. No pericardial effusion. Mediastinum/Nodes: No enlarged mediastinal, hilar, or axillary lymph nodes. Thyroid  gland, trachea, and esophagus demonstrate no significant findings. Lungs/Pleura: No pneumothorax or pleural effusion is noted. Minimal bibasilar subsegmental atelectasis is noted. Upper Abdomen: No acute abnormality. Musculoskeletal: No chest wall abnormality. No acute or significant osseous findings. Review of the MIP images confirms the above findings. IMPRESSION: No definite evidence of pulmonary embolus. Electronically Signed   By: Lynwood Landy Raddle M.D.   On: 09/11/2023 13:43     Procedures   Medications Ordered in the ED  cephALEXin  (KEFLEX ) capsule 500 mg (has no administration in time range)  alum & mag hydroxide-simeth (MAALOX/MYLANTA) 200-200-20 MG/5ML suspension 30 mL (has no administration in time range)  iohexol  (OMNIPAQUE ) 350 MG/ML injection 100 mL (100 mLs Intravenous Contrast Given 09/11/23 1233)                                    Medical Decision Making Amount and/or Complexity of Data Reviewed Labs: ordered. Radiology: ordered.  Risk Prescription drug management.   This patient presents to the ED for concern of cp, this involves an extensive number of treatment options, and is a complaint that carries with it a high risk of complications and morbidity.  The differential diagnosis includes cardiac, pulm, gi, msk   Co morbidities that complicate the patient evaluation  gerd, depression, fibroids, htn, migraines, arthritis, myasthenia gravis, uti, asthma, pancreatitis and dm   Additional history obtained:  Additional history obtained from epic chart review  Lab Tests:  I Ordered, and personally  interpreted labs.  The pertinent results include:  covid/flu/rsv; trop nl; bnp nl; ua + uti; cbc nl; cmp nl   Imaging Studies ordered:  I ordered imaging studies including cta chest  I independently visualized and interpreted imaging which showed No definite  evidence of pulmonary embolus.  I agree with the radiologist interpretation   Cardiac Monitoring:  The patient was maintained on a cardiac monitor.  I personally viewed and interpreted the cardiac monitored which showed an underlying rhythm of: nsr   Medicines ordered and prescription drug management:  I ordered medication including keflex   for uti and gi cocktail for GERD  Reevaluation of the patient after these medicines showed that the patient improved I have reviewed the patients home medicines and have made adjustments as needed   Test Considered:  ct  Problem List / ED Course:  Atypical CP.  Likely GERD. Cardiac work up neg.  CT chest neg.  Pt is getting an EGD on 7/22.  Pantoprazole  is not helping, so I will change her to nexium .  She is instructed to return if worse.  F/u with pcp/GI.   Reevaluation:  After the interventions noted above, I reevaluated the patient and found that they have :improved   Social Determinants of Health:  Lives at home   Dispostion:  After consideration of the diagnostic results and the patients response to treatment, I feel that the patent would benefit from discharge with outpatient f/u.       Final diagnoses:  Atypical chest pain  Gastroesophageal reflux disease, unspecified whether esophagitis present  Acute cystitis without hematuria    ED Discharge Orders          Ordered    esomeprazole  (NEXIUM ) 40 MG capsule  Daily        09/11/23 1353    cephALEXin  (KEFLEX ) 500 MG capsule  3 times daily        09/11/23 1354               Dean Clarity, MD 09/11/23 1357

## 2023-09-11 NOTE — Discharge Instructions (Addendum)
 Stop the Pantoprazole  (Protonix ) and start Esomeprazole  (Nexium )

## 2023-09-11 NOTE — Progress Notes (Signed)
    SUBJECTIVE:   CHIEF COMPLAINT / HPI:   Chest pain- on Tuesday was having trouble swallowing, had throat burning and then an episode of hemoptysis x1, quarter sized clot, none since. Last night started having left sided, sharp stabbing chest pain that radiates to left breast. Not worse with exertion. A little worse with eating. No pressure or squeezing or radiating down arm or to back. Not associated with shortness of breath or sweating. 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 NO fevers or cough. She feels like the pain started after her episode of hemoptysis on Tuesday. The pain comes and goes since last night. She notes her left leg chronically swells and hasn't seemed worse than usual. She denies history of VTE.  PERTINENT  PMH / PSH: myasthenia gravis, GERD, T2DM, idiopathic urticaria  OBJECTIVE:   BP 131/80   Pulse 91   Ht 5' (1.524 m)   Wt (!) 307 lb 3.2 oz (139.3 kg)   SpO2 100%   BMI 60.00 kg/m   General: A&O, NAD HEENT: No sign of trauma, EOM grossly intact Cardiac: RRR, no m/r/g Respiratory: CTAB, normal WOB, no w/c/r GI: Soft, NTTP, non-distended  Extremities: NTTP, 1+ edema L ankle (pt notes is chronic). Negative Homan's sign, no calf tenderness or erythema or cords. Neuro: Normal gait, moves all four extremities appropriately. Psych: Appropriate mood and affect  EKG in clinic read and interpreted by myself: NSR, occasional PACs, no ST segment changes, no T wave inversions, QTC 437  ASSESSMENT/PLAN:   Assessment & Plan Chest pain, unspecified type Sharp, stabbing, and worse with movement leans toward possible MSK cause However with L ankle swelling, hemoptysis x1 on Tuesday and stabbing chest pain that started yesterday, I have reasonably high suspicion for PE Discussed with patient and she is agreeable to be seen in ED for evaluation, offered EMS and discussed risks of traveling by private vehicle including death, she declines and will travel by  private vehicle Would recommend CTA, venous US  LLE, CBC, CMP, troponin, CXR, serial EKG in ED Discussed with her daughter as well     Rollene FORBES Keeling, MD [facility]

## 2023-09-11 NOTE — ED Triage Notes (Signed)
 Arrives POV with complaints shortness of breath and left side chest pain x1 week. Sent here to rule out PE.

## 2023-09-14 ENCOUNTER — Ambulatory Visit (INDEPENDENT_AMBULATORY_CARE_PROVIDER_SITE_OTHER): Admitting: Physical Therapy

## 2023-09-14 ENCOUNTER — Encounter (HOSPITAL_COMMUNITY): Payer: Self-pay | Admitting: Internal Medicine

## 2023-09-14 ENCOUNTER — Encounter: Payer: Self-pay | Admitting: Physical Therapy

## 2023-09-14 DIAGNOSIS — M6281 Muscle weakness (generalized): Secondary | ICD-10-CM

## 2023-09-14 DIAGNOSIS — G8929 Other chronic pain: Secondary | ICD-10-CM

## 2023-09-14 DIAGNOSIS — M25572 Pain in left ankle and joints of left foot: Secondary | ICD-10-CM | POA: Diagnosis not present

## 2023-09-14 DIAGNOSIS — R293 Abnormal posture: Secondary | ICD-10-CM | POA: Diagnosis not present

## 2023-09-14 DIAGNOSIS — R2689 Other abnormalities of gait and mobility: Secondary | ICD-10-CM

## 2023-09-14 DIAGNOSIS — M25512 Pain in left shoulder: Secondary | ICD-10-CM

## 2023-09-14 LAB — URINE CULTURE: Culture: >=100000 — AB

## 2023-09-14 NOTE — Therapy (Signed)
 OUTPATIENT PHYSICAL THERAPY TREATMENT DISCHARGE SUMMARY   Patient Name: Frances Medina MRN: [id number] DOB:Oct 30, 1960, 63 y.o., female Today's Date: 09/14/2023  END OF SESSION:  PT End of Session - 09/14/23 0804     Visit Number 5    Number of Visits 8    Date for PT Re-Evaluation 10/08/23    Authorization Type MEDICARE / [place]    Progress Note Due on Visit 8    PT Start Time 0802    PT Stop Time 0820    PT Time Calculation (min) 18 min    Activity Tolerance Patient tolerated treatment well    Behavior During Therapy [facility] for tasks assessed/performed              Past Medical History:  Diagnosis Date   Acute respiratory failure (HCC)    Acute respiratory failure with hypoxemia (HCC)    Aspiration into airway    Back pain 07/31/2017   Benign neoplasm of rectum    CAP (community acquired pneumonia) 08/05/2015   Depression with anxiety    Diabetes mellitus, new onset (HCC) 06/02/2023   Diverticulosis    Dizziness 03/13/2023   Dysphagia 03/2015   EGD, Dr Avram. mild antral gastritis, ? due to Ibuprofen .  no stricture but empirically maloney dilated esophagus.    Dysphagia, neurologic    E. 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   GERD (gastroesophageal reflux disease)    hiatal hernia   Hypertension    went away when I stopped smoking   Knee injury    Left hip pain 02/18/2017   Migraine    maybe couple times/month (03/20/2015)   Mild intermittent asthma 06/22/2017   Myasthenia gravis (HCC) 2017   Myasthenia gravis with acute exacerbation (HCC) 05/15/2015   Osteoarthritis of left knee 12/02/2013   Osteoarthritis of right knee 08/30/2013   Pancreatitis  07/25/2017   Protein-calorie malnutrition, severe (HCC) 08/07/2015   Seasonal allergies    takes Zytrec   Shortness of breath    Sinusitis 02/25/2018   Sleep apnea    with cPAp machine   Tracheostomy status (HCC)    Tubular adenoma of colon    Tumors    in my stomach   Umbilical hernia    watching , no plans for surgery at present   Urine incontinence 10/19/2015   Urticaria    VAP (ventilator-associated pneumonia) [facility])    Past Surgical History:  Procedure Laterality Date   BIOPSY  07/05/2019   Procedure: BIOPSY;  Surgeon: Aneita [name], MD;  Location: [facility] ENDOSCOPY;  Service: Endoscopy;;   CESAREAN SECTION  1987; 1989   COLONOSCOPY WITH PROPOFOL  N/A 09/19/2013   Procedure: COLONOSCOPY WITH PROPOFOL ;  Surgeon: [name] Aneita, MD;  Location: [facility] ENDOSCOPY;  Service: Endoscopy;  Laterality: N/A;   COLONOSCOPY WITH PROPOFOL  N/A 07/05/2019   Procedure: COLONOSCOPY WITH PROPOFOL ;  Surgeon: Aneita [name], MD;  Location: [facility];  Service: Endoscopy;  Laterality: N/A;   DILATION AND CURETTAGE OF UTERUS     ESOPHAGOGASTRODUODENOSCOPY N/A 08/10/2015   Procedure: ESOPHAGOGASTRODUODENOSCOPY (EGD);  Surgeon: Lupita FORBES Avram, MD;  Location: [facility] ENDOSCOPY;  Service: Endoscopy;  Laterality: N/A;   ESOPHAGOGASTRODUODENOSCOPY (EGD) WITH PROPOFOL  N/A 03/21/2015  Procedure: ESOPHAGOGASTRODUODENOSCOPY (EGD) WITH PROPOFOL ;  Surgeon: Lupita FORBES Commander, MD;  Location: [facility] ENDOSCOPY;  Service: Endoscopy;  Laterality: N/A;   ESOPHAGOGASTRODUODENOSCOPY (EGD) WITH PROPOFOL  N/A 07/05/2019   Procedure: ESOPHAGOGASTRODUODENOSCOPY (EGD) WITH PROPOFOL ;  Surgeon: Aneita [name], MD;  Location: [facility] ENDOSCOPY;  Service: Endoscopy;  Laterality: N/A;   PARTIAL KNEE ARTHROPLASTY Right 08/30/2013   Procedure: RIGHT UNICOMPARTMENTAL KNEE;  Surgeon: Fonda SHAUNNA Olmsted, MD;  Location: [facility] OR;  Service: Orthopedics;  Laterality: Right;   PARTIAL KNEE ARTHROPLASTY Left 12/02/2013   Procedure: LEFT KNEE UNI ARTHROPLASTY;  Surgeon:  Fonda SHAUNNA Olmsted, MD;  Location: [facility];  Service: Orthopedics;  Laterality: Left;   PEG PLACEMENT N/A 08/10/2015   Procedure: PERCUTANEOUS ENDOSCOPIC GASTROSTOMY (PEG) PLACEMENT;  Surgeon: Lupita FORBES Commander, MD;  Location: [facility] ENDOSCOPY;  Service: Endoscopy;  Laterality: N/A;   POLYPECTOMY  07/05/2019   Procedure: POLYPECTOMY;  Surgeon: Aneita [name], MD;  Location: [facility] ENDOSCOPY;  Service: Endoscopy;;   TUBAL LIGATION  1989   VAGINAL HYSTERECTOMY  1990's?   apparently took out one of my ovaries at the time too cause one's missing   Patient Active Problem List   Diagnosis Date Noted   Adverse food reaction 08/20/2023   Chronic rhinitis 08/20/2023   Left shoulder pain 07/06/2023   Diabetes mellitus, new onset (HCC) 06/02/2023   Idiopathic urticaria 04/15/2023   Knee pain 03/13/2023   BMI 60.0-69.9, adult (HCC) 01/26/2023   Lumbar radiculopathy 12/03/2021   Positive ANA (antinuclear antibody) 08/26/2019   History of colonic polyps    Macromastia 06/17/2019   Seasonal allergies 02/18/2019   GAD (generalized anxiety disorder) 08/06/2018   Iron  deficiency anemia 07/31/2017   Osteoarthritis of left hip 02/18/2017   OSA on CPAP 09/11/2016   Restrictive lung disease 10/11/2015   Major depression    Headache, migraine    Splenomegaly 04/07/2015   Myasthenia gravis (HCC)    Osteoarthritis of left knee 12/02/2013   Benign neoplasm of descending colon 09/19/2013   Osteoarthritis of right knee 08/30/2013   GERD (gastroesophageal reflux disease) 06/23/2013   Fibroids 06/23/2013    PCP: Otto DASEN Fairly, MD  REFERRING PROVIDER: Otto DASEN Fairly, MD  REFERRING DIAG:  Diagnosis  M25.512 (ICD-10-CM) - Acute pain of left shoulder    THERAPY DIAG:  Muscle weakness (generalized)  Abnormal posture  Chronic left shoulder pain  Pain in left ankle and joints of left foot  Other abnormalities of gait and mobility  Rationale for Evaluation and Treatment:  Rehabilitation  ONSET DATE: December of 2024.  SUBJECTIVE:  SUBJECTIVE STATEMENT: Shoulder is doing well; still having some reflux issues, pain is typically 0/10, occasionally up to 2/10  Hand dominance: Right  PERTINENT HISTORY: Myasthenia gravis, DM, Bil knee osteoarthritis, morbid obesity, GERD, anemia, shortness of breath, Bil partial knee replacements and depression/anxiety.  PAIN:  NPRS scale: 0/10 recently Pain location: Lt shoulder, anterior and lateral shoulder Pain description: Achy, can be sharp with reaching  Aggravating factors: Reaching, overhead and impingement postures Relieving factors: Tylenol , ice  PRECAUTIONS: Shoulder  RED FLAGS: None   WEIGHT BEARING RESTRICTIONS: No  FALLS:  Has patient fallen in last 6 months? No  LIVING ENVIRONMENT: Lives with: lives with their family Lives in: House/apartment Stairs: Needs the handrail which is on the left at home Has following equipment at home: Single point cane  OCCUPATION: Disability  PLOF: Independent with community mobility with device  PATIENT GOALS: Be able to use the shoulder with normal function and without pain  NEXT MD VISIT: NA  OBJECTIVE:  Note: Objective measures were completed at Evaluation unless otherwise noted.  DIAGNOSTIC FINDINGS:  1. Mild glenohumeral and moderate acromioclavicular osteoarthritis. 2. Moderate distal lateral subacromial spurring. Possible loose body just inferior to the acromion.  PATIENT SURVEYS:  Patient-Specific Activity Scoring Scheme  0 represents "unable to perform." 10 represents "able to perform at prior level. 0 1 2 3 4 5 6 7 8 9  10 (Date and Score)   Activity  08/13/2023   09/14/23  1.  Walk long distances with a cane 6/10 10   2.  Cleaning 6/10 10   3.  Navigate  stairs with a handrail 6/10 10  Score Average 6 10   Total score = sum of the activity scores/number of activities Minimum detectable change (90%CI) for average score = 2 points Minimum detectable change (90%CI) for single activity score = 3 points   COGNITION: 08/13/2023 Overall cognitive status: Within functional limits for tasks assessed     SENSATION: 08/13/2023 No complaints of peripheral pain or paresthesias  POSTURE: 08/13/2023 Significant for forward head, internally rotated and protracted shoulders  UPPER EXTREMITY ROM:   Passive ROM Left/Right 08/13/2023 Lt 08/26/2023 AROM in supine Left 09/14/23  Shoulder flexion 155/160 158 with end range pain A: 156 sitting  Shoulder extension     Shoulder abduction  140 with end range pain   Shoulder horizontal adduction 20/15  A: 25 sitting  Shoulder internal rotation 60/60 68 In 45 deg abduction with end range pain   Shoulder external rotation 65/70 60 In 45 deg abduction A: 72 sitting  (Blank rows = not tested)  STRENGTH:   Left/Right 08/13/2023 hand-held dynamometer Left 09/07/23 Left 09/14/23  Shoulder internal rotation 20.6/25.7 26.45# 29.7#  Shoulder external rotation 15.0/14.2 17.6# 20.2#  (Blank rows = not tested)  TREATMENT 09/14/23 TherEx UBE fwd/back 3 mins each way with 1 min rest break; L4 MMT and ROM measurements - see above for details Discussed current HEP and when/how to progress - pt verbalized understanding  09/07/23 TherEx UBE fwd/back 3 mins each way with 1 min rest break; L4 MMT - see above for details Shoulder IR/ER L4 band 2x10 Seated shoulder flexion/abduction circuit 2x10 with 2# weights bil Seated bicep curls 2x10; 2# Seated crossbody stretch 10 x 5 sec hold IR stretch with strap and Lt hand behind back 10x10 sec hold  TherAct Supine shoulder flexion with  5 sec hold at end range 2#, 2x10  Neuro Re-Ed Rows L4 band 2x10; 5 sec hold Shoulder extension 2x10; L4 band Seated L2 band drive the bus x 10 reps Supine shoulder circles x 20 reps CW/CCW in 90 deg flexion, 2# DM Supine shoulder protraction 2x10; 2# DB with 5 sec hold   08/31/23 TherEx UBE fwd/back 3 mins each way with 1 min rest break; L4 Standing shoulder IR/ER with L4 band 2x15 on Lt Supine cross arm posterior capsule stretch Lt arm 20 sec x 3  Seated tricep extension 2# bil 2x15 Sidelying ER on Lt 2x15; 2#  Neuro Re-Ed Standing L4 band rows c scapular retraction focus 2 x 15; 3 sec hold Standing L4 shoulder ext 2x15, 3 sec hold Supine Lt shoulder protraction 5 sec hold 2#  2x15 Supine shoulder circles in 90 deg flexion x 15 CW/CCW  TherAct Standing shoulder flexion to 90 deg 2x15; 2# circuit Standing shoulder abduction to 90 deg 2x15, 2# bil, circuit Seated bicep curl into overhead press 2# bil; 2x15 Supine shoulder flexion with 5 sec hold at end range 2#, 2x15   PATIENT EDUCATION: 6/25/205 Education details: HEP updates Person educated: Patient Education method: Explanation, Demonstration, Tactile cues, Verbal cues, and Handouts Education comprehension: verbalized understanding, returned demonstration, verbal cues required, tactile cues required, and needs further education  HOME EXERCISE PROGRAM: Access Code: [email or web address] URL: [email or web address] Date: 08/26/2023 Prepared by: Ozell Silvan  Exercises - Standing Scapular Retraction  - 5 x daily - 7 x weekly - 1 sets - 5 reps - 5 second hold - Standing Shoulder Posterior Capsule Stretch (Mirrored)  - 2 x daily - 7 x weekly - 1 sets - 3 reps - 15 hold - Supine Scapular Protraction in Flexion with Dumbbells  - 2 x daily - 7 x weekly - 1 sets - 20-30 reps - 3 seconds hold - Shoulder External Rotation with Anchored Resistance  - 2 x daily - 7 x weekly - 2 sets - 10 reps - 3 hold - Shoulder Internal  Rotation with Resistance  - 2 x daily - 7 x weekly - 2 sets - 10 reps - 3 hold - Standing Bilateral Low Shoulder Row with Anchored Resistance  - 1-2 x daily - 7 x weekly - 2-3 sets - 10-15 reps - Shoulder Extension with Resistance  - 1-2 x daily - 7 x weekly - 1-2 sets - 10-15 reps  ASSESSMENT:  CLINICAL IMPRESSION:  Pt has met all LTGs at this time and is pleased with her progress.  Will d/c PT today.  OBJECTIVE IMPAIRMENTS: decreased activity tolerance, decreased endurance, decreased knowledge of condition, decreased ROM, decreased strength, decreased safety awareness, impaired perceived functional ability, impaired UE functional use, postural dysfunction, obesity, and pain.   ACTIVITY LIMITATIONS: carrying, lifting, standing, stairs, and reach over head  PARTICIPATION LIMITATIONS: cleaning and community activity  PERSONAL FACTORS: Myasthenia gravis, DM,  Bil knee osteoarthritis, morbid obesity, GERD, anemia, shortness of breath, Bil partial knee replacements and depression/anxiety are also affecting patient's functional outcome.   REHAB POTENTIAL: Good  CLINICAL DECISION MAKING: Stable/uncomplicated  EVALUATION COMPLEXITY: Low   GOALS: Goals reviewed with patient? Yes  SHORT TERM GOALS: Target date: 09/10/2023  Hendel will be independent with her day 1 home exercise program Baseline: Started 08/13/2023 Goal status: MET 09/14/23  2.  Improve bilateral shoulder active range of motion for flexion to 170 degrees; external rotation 90 degrees; and horizontal adduction to 40 degrees Baseline: 155/160; 65/70 and 20/15 respectively Goal status: NOT MET (improved, but not to goal) 09/14/23   LONG TERM GOALS: Target date: 10/08/2023  Improve patient specific functional score to at least 8 Baseline: 6 Goal status: MET 09/14/23  2.  Several report left shoulder pain no greater than 3/10 on the numeric pain rating scale Baseline: Can be 6/10 Goal status: MET 09/14/23  3.  Improve  bilateral shoulder ER strength to at least 18 pounds and IR strength to at least 28 pounds Baseline: 15.0/14.2 and 20.6/25.7 pounds respectively Goal status: MET 09/14/23  4.  Shainna will be independent with her long-term maintenance home exercise program at discharge Baseline: Started 08/13/2023 Goal status: MET 09/14/23  PLAN:  PT FREQUENCY: 1x/week  PT DURATION: 8 weeks  PLANNED INTERVENTIONS: 97750- Physical Performance Testing, 97110-Therapeutic exercises, 97530- Therapeutic activity, 97112- Neuromuscular re-education, 97535- Self Care, 02859- Manual therapy, Patient/Family education, Joint mobilization, and Cryotherapy  PLAN FOR NEXT SESSION  d/c PT   Corean JULIANNA Ku, PT, DPT 09/14/23 9:17 AM   PHYSICAL THERAPY DISCHARGE SUMMARY  Visits from Start of Care: 5  Current functional level related to goals / functional outcomes: See above   Remaining deficits: See above   Education / Equipment: HEP   Patient agrees to discharge. Patient goals were met. Patient is being discharged due to meeting the stated rehab goals.   Corean JULIANNA Ku, PT, DPT 09/14/23 9:19 AM  [facility] [address] [place], [name], [address] Phone: [phone]   Fax:  [phone]

## 2023-09-14 NOTE — Progress Notes (Signed)
 Attempted to obtain medical history for pre op call via telephone, unable to reach at this time. HIPAA compliant voicemail message left requesting return call to pre surgical testing department.

## 2023-09-15 ENCOUNTER — Telehealth (HOSPITAL_BASED_OUTPATIENT_CLINIC_OR_DEPARTMENT_OTHER): Payer: Self-pay | Admitting: *Deleted

## 2023-09-15 NOTE — Telephone Encounter (Signed)
 Post ED Visit - Positive Culture Follow-up  Culture report reviewed by antimicrobial stewardship pharmacist: [facility] [x]  Koren Or, Pharm.D. []  Venetia Gully, [address].D., BCPS AQ-ID []  Garrel Crews, Pharm.D., BCPS []  Almarie Lunger, [address].D., BCPS []  [place], [address].D., BCPS, AAHIVP []  Rosaline Bihari, Pharm.D., BCPS, AAHIVP []  Vernell Meier, PharmD, BCPS []  Latanya Hint, PharmD, BCPS []  Donald Medley, PharmD, BCPS []  Rocky Bold, PharmD []  Dorothyann Alert, PharmD, BCPS []  Morene Babe, PharmD  [facility] []  Rosaline Edison, PharmD []  Romona Bliss, PharmD []  Dolphus Roller, PharmD []  Veva Seip, Rph []  Vernell Daunt) Leonce, PharmD []  Eva Allis, PharmD []  Rosaline Millet, PharmD []  Iantha Batch, PharmD []  Arvin Gauss, PharmD []  Wanda Hasting, PharmD []  Ronal Rav, PharmD []  Rocky Slade, PharmD []  Bard Jeans, PharmD   Positive urine culture Treated with cephalexin .  Plan per EDP, Dr. Laurice: if having urinary sx, stop keflex  and start Bactrim  DS.  Spoke with pt who denies urinary sx.  No further patient follow-up is required at this time.  Lorita Barnie Pereyra 09/15/2023, 1:22 PM

## 2023-09-15 NOTE — Telephone Encounter (Signed)
 Post ED Visit - Positive Culture Follow-up: Unsuccessful Patient Follow-up  Culture assessed and recommendations reviewed by:  [x]  Koren Or, Pharm.D. []  Venetia Gully, Pharm.D., BCPS AQ-ID []  Garrel Crews, Pharm.D., BCPS []  Almarie Lunger, Pharm.D., BCPS []  [place], [place].D., BCPS, AAHIVP []  Rosaline Bihari, Pharm.D., BCPS, AAHIVP []  Massie Rigg, PharmD []  Jodie Rower, PharmD, BCPS  Positive urine culture  []  Patient discharged without antimicrobial prescription and treatment is now indicated [x]  Organism is resistant to prescribed ED discharge antimicrobial []  Patient with positive blood cultures  Plan per EDP, Dr. Laurice: if having urinary sx, stop Keflex  and start Bactrim  DS 1 po BID x 3 days. Unable to contact patient after 3 attempts, letter will be sent to address on file  Frances Medina 09/15/2023, 12:39 PM

## 2023-09-15 NOTE — Progress Notes (Signed)
 PCP-Eniola MD Cardiologist-Acharya Pulmonologist-Dewald   EKG-04/14/23 Echo-01/09/22 Cath-n/a Stress-04/19/19 ICD/PM- n/a GLP1-Mounjaro  last dose 7/13 hold 1 week Blood Thinner- n/a  HX: DM,HTN,Asthma,OSA, Trach removed over 5 years ago, Myasthenia  Gravis. Sees a lot of specialist doctors.Patient last saw her pcp on 08/04/23.Neuro for her MG last seen 12/29/22. Patient endorses got covid back in December of last year and has struggled since with this lingering cough/rash intermittently. Did got to ED in june for this, she said they told her was just COPD. F/u with Pulm after this visit on 6/26 they recommended doing PFTS, which haven't been done or scheduled yet. Also did go to ED on 7/11 for atypical chest pain which they dx her with GERD, UTI- gave her some abx and more reflux meds. Does also see cards MD last visit 11/2022 f/u 1 year. She endorses that she feels fine other than the cough sometimes, its random when flares up but they aren't really sure what cause the whole body reaction with rash. She says she doesn't feel SOB/any cardiac issues, uses cane for long distances.  Anesthesia Review- Yes- okay to proceed

## 2023-09-17 ENCOUNTER — Telehealth: Payer: Self-pay

## 2023-09-17 NOTE — Telephone Encounter (Signed)
 Procedure:COLONOSCOPY Procedure date: 09/22/23 Procedure location: [facility] Arrival Time: 9:30 Spoke with the patient Y/N: N Any prep concerns? N  Has the patient obtained the prep from the pharmacy ? N Do you have a care partner and transportation: N Any additional concerns? N  I 3 attempts to contact the patient by phone but only got the patient voice mail. I let a message about the procedure and left the office number in case the patient had questions.

## 2023-09-17 NOTE — Telephone Encounter (Signed)
 My chart message sent as well

## 2023-09-18 NOTE — Telephone Encounter (Signed)
 Patient confirmed appointment.

## 2023-09-21 ENCOUNTER — Encounter: Payer: Self-pay | Admitting: Family Medicine

## 2023-09-21 MED ORDER — CLONAZEPAM 0.5 MG PO TABS: ORAL_TABLET | ORAL | 1 refills | Status: DC

## 2023-09-22 ENCOUNTER — Ambulatory Visit (HOSPITAL_COMMUNITY): Payer: Self-pay | Admitting: Anesthesiology

## 2023-09-22 ENCOUNTER — Encounter (HOSPITAL_COMMUNITY): Payer: Self-pay | Admitting: Internal Medicine

## 2023-09-22 ENCOUNTER — Encounter (HOSPITAL_COMMUNITY)
Admission: RE | Disposition: A | Discharge status: Home / Self Care | Payer: Self-pay | Source: Home / Self Care | Attending: Internal Medicine

## 2023-09-22 ENCOUNTER — Ambulatory Visit (HOSPITAL_COMMUNITY)
Admission: RE | Admit: 2023-09-22 | Discharge: 2023-09-22 | Disposition: A | Discharge status: Home / Self Care | Attending: Internal Medicine | Admitting: Internal Medicine

## 2023-09-22 ENCOUNTER — Other Ambulatory Visit: Payer: Self-pay

## 2023-09-22 DIAGNOSIS — F419 Anxiety disorder, unspecified: Secondary | ICD-10-CM | POA: Diagnosis not present

## 2023-09-22 DIAGNOSIS — Z930 Tracheostomy status: Secondary | ICD-10-CM | POA: Insufficient documentation

## 2023-09-22 DIAGNOSIS — Z7985 Long-term (current) use of injectable non-insulin antidiabetic drugs: Secondary | ICD-10-CM | POA: Insufficient documentation

## 2023-09-22 DIAGNOSIS — J4489 Other specified chronic obstructive pulmonary disease: Secondary | ICD-10-CM | POA: Insufficient documentation

## 2023-09-22 DIAGNOSIS — K621 Rectal polyp: Secondary | ICD-10-CM

## 2023-09-22 DIAGNOSIS — R1013 Epigastric pain: Secondary | ICD-10-CM | POA: Insufficient documentation

## 2023-09-22 DIAGNOSIS — E119 Type 2 diabetes mellitus without complications: Secondary | ICD-10-CM | POA: Insufficient documentation

## 2023-09-22 DIAGNOSIS — R1310 Dysphagia, unspecified: Secondary | ICD-10-CM

## 2023-09-22 DIAGNOSIS — K449 Diaphragmatic hernia without obstruction or gangrene: Secondary | ICD-10-CM | POA: Diagnosis not present

## 2023-09-22 DIAGNOSIS — K219 Gastro-esophageal reflux disease without esophagitis: Secondary | ICD-10-CM

## 2023-09-22 DIAGNOSIS — Z87891 Personal history of nicotine dependence: Secondary | ICD-10-CM | POA: Insufficient documentation

## 2023-09-22 DIAGNOSIS — F32A Depression, unspecified: Secondary | ICD-10-CM | POA: Diagnosis not present

## 2023-09-22 DIAGNOSIS — G4730 Sleep apnea, unspecified: Secondary | ICD-10-CM | POA: Insufficient documentation

## 2023-09-22 DIAGNOSIS — K3189 Other diseases of stomach and duodenum: Secondary | ICD-10-CM | POA: Diagnosis not present

## 2023-09-22 DIAGNOSIS — Z7984 Long term (current) use of oral hypoglycemic drugs: Secondary | ICD-10-CM | POA: Diagnosis not present

## 2023-09-22 DIAGNOSIS — G7000 Myasthenia gravis without (acute) exacerbation: Secondary | ICD-10-CM | POA: Diagnosis not present

## 2023-09-22 DIAGNOSIS — J449 Chronic obstructive pulmonary disease, unspecified: Secondary | ICD-10-CM

## 2023-09-22 DIAGNOSIS — Z85038 Personal history of other malignant neoplasm of large intestine: Secondary | ICD-10-CM | POA: Diagnosis not present

## 2023-09-22 DIAGNOSIS — K648 Other hemorrhoids: Secondary | ICD-10-CM | POA: Diagnosis not present

## 2023-09-22 DIAGNOSIS — K222 Esophageal obstruction: Secondary | ICD-10-CM

## 2023-09-22 DIAGNOSIS — R1319 Other dysphagia: Secondary | ICD-10-CM

## 2023-09-22 DIAGNOSIS — D509 Iron deficiency anemia, unspecified: Secondary | ICD-10-CM

## 2023-09-22 DIAGNOSIS — D136 Benign neoplasm of pancreas: Secondary | ICD-10-CM

## 2023-09-22 DIAGNOSIS — K2950 Unspecified chronic gastritis without bleeding: Secondary | ICD-10-CM

## 2023-09-22 DIAGNOSIS — D122 Benign neoplasm of ascending colon: Secondary | ICD-10-CM | POA: Diagnosis not present

## 2023-09-22 HISTORY — PX: COLONOSCOPY: SHX5424

## 2023-09-22 HISTORY — PX: ESOPHAGOGASTRODUODENOSCOPY: SHX5428

## 2023-09-22 LAB — GLUCOSE, CAPILLARY
Glucose-Capillary: 88 mg/dL (ref 70–99)
Glucose-Capillary: 79 mg/dL (ref 70–99)

## 2023-09-22 SURGERY — COLONOSCOPY
Anesthesia: Monitor Anesthesia Care

## 2023-09-22 MED ORDER — PROPOFOL 500 MG/50ML IV EMUL
INTRAVENOUS | Status: DC | PRN
Start: 2023-09-22 — End: 2023-09-22
  Administered 2023-09-22: 180 ug/kg/min via INTRAVENOUS

## 2023-09-22 MED ORDER — SODIUM CHLORIDE 0.9 % IV SOLN: INTRAVENOUS | Status: DC

## 2023-09-22 MED ORDER — PROPOFOL 10 MG/ML IV BOLUS
INTRAVENOUS | Status: DC | PRN
  Administered 2023-09-22: 40 mg via INTRAVENOUS

## 2023-09-22 MED ORDER — LIDOCAINE 2% (20 MG/ML) 5 ML SYRINGE
INTRAMUSCULAR | Status: DC | PRN
  Administered 2023-09-22: 80 mg via INTRAVENOUS

## 2023-09-22 MED ORDER — PHENYLEPHRINE 80 MCG/ML (10ML) SYRINGE FOR IV PUSH (FOR BLOOD PRESSURE SUPPORT)
PREFILLED_SYRINGE | INTRAVENOUS | Status: DC | PRN
  Administered 2023-09-22: 80 ug via INTRAVENOUS

## 2023-09-22 MED ORDER — ESOMEPRAZOLE MAGNESIUM 40 MG PO CPDR: DELAYED_RELEASE_CAPSULE | ORAL | 2 refills | Status: DC

## 2023-09-22 NOTE — H&P (Addendum)
 GASTROENTEROLOGY PROCEDURE H&P NOTE   Primary Care Physician: Anders Otto DASEN, MD    Reason for Procedure:   IDA, dysphagia, epigastric ab pain, variceal screening, history of colon polyps  Plan:    EGD/colonoscopy  Patient is appropriate for endoscopic procedure(s) in the ambulatory (hospital) setting.  The nature of the procedure, as well as the risks, benefits, and alternatives were carefully and thoroughly reviewed with the patient. Ample time for discussion and questions allowed. The patient understood, was satisfied, and agreed to proceed.     HPI: Frances Medina is a 63 y.o. female who presents for EGD/colonoscopy for evaluation of IDA, dysphagia, epigastric ab pain, variceal screening, history of colon polyps.  Patient was most recently seen in the Gastroenterology Clinic on 08/10/23.  No interval change in medical history since that appointment. Please refer to that note for full details regarding GI history and clinical presentation.   Past Medical History:  Diagnosis Date   Acute respiratory failure (HCC)    Acute respiratory failure with hypoxemia (HCC)    Aspiration into airway    Back pain 07/31/2017   Benign neoplasm of rectum    CAP (community acquired pneumonia) 08/05/2015   Depression with anxiety    Diabetes mellitus, new onset (HCC) 06/02/2023   Diverticulosis    Dizziness 03/13/2023   Dysphagia 03/2015   EGD, Dr Avram. mild antral gastritis, ? due to Ibuprofen .  no stricture but empirically maloney dilated esophagus.    Dysphagia, neurologic    E. 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   GERD (gastroesophageal reflux disease)    hiatal hernia   Hypertension     went away when I stopped smoking   Knee injury    Left hip pain 02/18/2017   Migraine    maybe couple times/month (03/20/2015)   Mild intermittent asthma 06/22/2017   Myasthenia gravis (HCC) 2017   Myasthenia gravis with acute exacerbation (HCC) 05/15/2015   Osteoarthritis of left knee 12/02/2013   Osteoarthritis of right knee 08/30/2013   Pancreatitis 07/25/2017   Protein-calorie malnutrition, severe (HCC) 08/07/2015   Seasonal allergies    takes Zytrec   Shortness of breath    Sinusitis 02/25/2018   Sleep apnea    with cPAp machine   Tracheostomy status (HCC)    Tubular adenoma of colon    Tumors    in my stomach   Umbilical hernia    watching , no plans for surgery at present   Urine incontinence 10/19/2015   Urticaria    VAP (ventilator-associated pneumonia) [facility])     Past Surgical History:  Procedure Laterality Date   BIOPSY  07/05/2019   Procedure: BIOPSY;  Surgeon: Aneita [name], MD;  Location: [facility] ENDOSCOPY;  Service: Endoscopy;;   CESAREAN SECTION  1987; 1989   COLONOSCOPY WITH PROPOFOL  N/A 09/19/2013   Procedure: COLONOSCOPY WITH PROPOFOL ;  Surgeon: [name] Aneita, MD;  Location: [facility];  Service: Endoscopy;  Laterality: N/A;   COLONOSCOPY WITH PROPOFOL  N/A 07/05/2019   Procedure: COLONOSCOPY WITH PROPOFOL ;  Surgeon: Aneita [name], MD;  Location: [facility];  Service: Endoscopy;  Laterality: N/A;   DILATION AND CURETTAGE OF UTERUS     ESOPHAGOGASTRODUODENOSCOPY N/A  08/10/2015   Procedure: ESOPHAGOGASTRODUODENOSCOPY (EGD);  Surgeon: Lupita FORBES Commander, MD;  Location: [facility] ENDOSCOPY;  Service: Endoscopy;  Laterality: N/A;   ESOPHAGOGASTRODUODENOSCOPY (EGD) WITH PROPOFOL  N/A 03/21/2015   Procedure: ESOPHAGOGASTRODUODENOSCOPY (EGD) WITH PROPOFOL ;  Surgeon: Lupita FORBES Commander, MD;  Location: [facility] ENDOSCOPY;  Service: Endoscopy;  Laterality: N/A;   ESOPHAGOGASTRODUODENOSCOPY (EGD) WITH PROPOFOL  N/A 07/05/2019   Procedure: ESOPHAGOGASTRODUODENOSCOPY (EGD) WITH  PROPOFOL ;  Surgeon: Aneita [name], MD;  Location: [facility]OS[facility];  Service: Endoscopy;  Laterality: N/A;   PARTIAL KNEE ARTHROPLASTY Right 08/30/2013   Procedure: RIGHT UNICOMPARTMENTAL KNEE;  Surgeon: Fonda SHAUNNA Olmsted, MD;  Location: [facility] OR;  Service: Orthopedics;  Laterality: Right;   PARTIAL KNEE ARTHROPLASTY Left 12/02/2013   Procedure: LEFT KNEE UNI ARTHROPLASTY;  Surgeon: Fonda SHAUNNA Olmsted, MD;  Location: [facility];  Service: Orthopedics;  Laterality: Left;   PEG PLACEMENT N/A 08/10/2015   Procedure: PERCUTANEOUS ENDOSCOPIC GASTROSTOMY (PEG) PLACEMENT;  Surgeon: Lupita FORBES Commander, MD;  Location: [facility] ENDOSCOPY;  Service: Endoscopy;  Laterality: N/A;   POLYPECTOMY  07/05/2019   Procedure: POLYPECTOMY;  Surgeon: Aneita [name], MD;  Location: [facility] END[facility];  Service: Endoscopy;;   TUBAL LIGATION  1989   VAGINAL HYSTERECTOMY  1990's?   apparently took out one of my ovaries at the time too cause one's missing    Prior to Admission medications   Medication Sig Start Date End Date Taking? Authorizing Provider  acetaminophen  (TYLENOL ) 500 MG tablet Take 500 mg by mouth in the morning and at bedtime.   Yes [provider]  albuterol  (PROVENTIL ) (2.5 MG/3ML) 0.083% nebulizer solution Take 3 mLs (2.5 mg total) by nebulization every 6 (six) hours as needed for wheezing or shortness of breath. 04/01/23  Yes Eniola, Kehinde T, MD  albuterol  (VENTOLIN  HFA) 108 (90 Base) MCG/ACT inhaler INHALE 2 PUFFS INTO THE LUNGS EVERY 6 HOURS AS NEEDED FOR WHEEZE OR SHORTNESS OF BREATH 04/01/23  Yes Anders Cumins T, MD  cephALEXin  (KEFLEX ) 500 MG capsule Take 1 capsule (500 mg total) by mouth 3 (three) times daily. 09/11/23  Yes Dean Clarity, MD  clonazePAM  (KLONOPIN ) 0.5 MG tablet TAKE 1 TABLET BY MOUTH TWICE A DAY AS NEEDED FOR ANXIETY 09/21/23  Yes Anders Cumins DASEN, MD  Cyanocobalamin  (VITAMIN B12 PO) Take 1 tablet by mouth daily.   Yes [provider]  esomeprazole  (NEXIUM ) 40 MG capsule  Take 1 capsule (40 mg total) by mouth daily. 09/11/23  Yes Dean Clarity, MD  famotidine  (PEPCID ) 20 MG tablet Take 1 tablet (20 mg total) by mouth 2 (two) times daily as needed for heartburn or indigestion. 08/20/23  Yes Ambs, Arlean HERO, FNP  fexofenadine  (ALLEGRA ) 180 MG tablet Take 1 tablet (180 mg total) by mouth daily. 08/04/23  Yes Anders Cumins DASEN, MD  fluticasone  (FLONASE ) 50 MCG/ACT nasal spray Place 2 sprays into both nostrils daily. 06/30/23  Yes Patel, Arleta SHAUNNA, MD  Menthol  10 MG LOZG Take 1 lozenge by mouth daily as needed (cough).   Yes [provider]  montelukast  (SINGULAIR ) 10 MG tablet TAKE 1 TABLET BY MOUTH EVERYDAY AT BEDTIME 05/05/23  Yes Eniola, Cumins DASEN, MD  mycophenolate  (CELLCEPT ) 500 MG tablet Take 500 mg by mouth 2 (two) times daily. 01/15/22  Yes [provider]  Na Sulfate-K Sulfate-Mg Sulfate concentrate (SUPREP BOWEL PREP KIT) 17.5-3.13-1.6 GM/177ML SOLN Take 1 kit (354 mLs total) by mouth as directed. 08/10/23  Yes Craig Palma R, PA-C  sertraline  (ZOLOFT ) 50 MG tablet TAKE 3 TABLETS BY MOUTH  EVERY DAY 05/12/23  Yes Anders Otto DASEN, MD  tirzepatide  (MOUNJARO ) 5 MG/0.5ML Pen Inject 5 mg into the skin once a week. 07/06/23  Yes Anders Otto DASEN, MD  VITAMIN D , CHOLECALCIFEROL, PO Take 1 tablet by mouth daily. otc   Yes [provider]  NAPOLEON INHUB 250-50 MCG/ACT AEPB INHALE 1 PUFF INTO THE LUNGS IN THE MORNING AND AT BEDTIME. 07/29/23  Yes Kara Dorn NOVAK, MD  Accu-Chek Softclix Lancets lancets Use as instructed 07/07/23   McDiarmid, Krystal BIRCH, MD  APPLE CIDER VINEGAR PO Take 1 capsule by mouth daily.    [provider]  Ascorbic Acid (VITAMIN C PO) Take 1,000 mg by mouth daily.    [provider]  azithromycin  (ZITHROMAX ) 250 MG tablet Take 1 tablet (250 mg total) by mouth daily. Take first 2 tablets together, then 1 every day until finished. 08/12/23   Enedelia Dorna HERO, FNP  Blood Glucose Monitoring Suppl (ACCU-CHEK GUIDE ME)  w/Device KIT Use as directed. 07/07/23   McDiarmid, Krystal BIRCH, MD  Continuous Glucose Sensor (FREESTYLE LIBRE 3 PLUS SENSOR) MISC Change sensor every 15 days. 06/03/23   McDiarmid, Krystal BIRCH, MD  EPINEPHrine  0.3 mg/0.3 mL IJ SOAJ injection Inject 0.3 mg into the muscle as needed for anaphylaxis. 05/06/23   Cheryl Reusing, FNP  glucose blood (ACCU-CHEK SMARTVIEW) test strip Use as instructed 07/07/23   McDiarmid, Krystal BIRCH, MD  halobetasol  (ULTRAVATE ) 0.05 % cream Apply topically 2 (two) times daily. 08/15/23   Anders Otto DASEN, MD  Lancets Misc. (ACCU-CHEK SOFTCLIX LANCET DEV) KIT Use as directed 07/07/23   McDiarmid, Krystal BIRCH, MD  metFORMIN (GLUCOPHAGE-XR) 500 MG 24 hr tablet Take 1,000 mg by mouth. 08/06/23   [provider]  nystatin  (MYCOSTATIN /NYSTOP ) powder APPLY TOPICALLY 3 TIMES DAILY AS NEEDED. 11/06/22   Anders Otto DASEN, MD    Current Facility-Administered Medications  Medication Dose Route Frequency Provider Last Rate Last Admin   0.9 %  sodium chloride  infusion   Intravenous Continuous Craig Palma R, PA-C        Allergies as of 08/10/2023 - Review Complete 08/10/2023  Allergen Reaction Noted   Ciprofloxacin  Shortness Of Breath and Rash 04/07/2015   Naproxen  Shortness Of Breath 03/01/2019   Shrimp [shellfish allergy ] Hives and Shortness Of Breath 04/05/2015   Dicyclomine Hives 01/14/2011   Methocarbamol  Hives 09/19/2013   Pineapple Itching 08/26/2019   Morphine  and codeine  Other (See Comments) 10/01/2017    Family History  Problem Relation Age of Onset   Heart disease Mother 61   Hypertension Mother    Stroke Father    Hypertension Father    Atopy Sister    Allergic rhinitis Sister    Other Sister        Esophageal strictures   Allergic rhinitis Sister    Anemia Sister    Leukemia Sister    Leukemia Sister    COPD Brother    Colon cancer Neg Hx     Social History   Socioeconomic History   Marital status: Married    Spouse name: Not on file   Number of children: 3    Years of education: Not on file   Highest education level: 12th grade  Occupational History   Occupation: disabled  Tobacco Use   Smoking status: Former    Current packs/day: 0.00    Average packs/day: 1.5 packs/day for 38.2 years (57.3 ttl pk-yrs)    Types: Cigarettes    Start date: 03/03/1974    Quit date:  05/14/2012    Years since quitting: 11.3    Passive exposure: Past   Smokeless tobacco: Never   Tobacco comments:    denies thoughts of restarting  Vaping Use   Vaping status: Former  Substance and Sexual Activity   Alcohol use: No    Alcohol/week: 0.0 standard drinks of alcohol   Drug use: No   Sexual activity: Not Currently    Birth control/protection: Surgical  Other Topics Concern   Not on file  Social History Narrative   Work or School: [employer] - [employer] Situation: lives with husband and daughters      Spiritual Beliefs: Christian      Lifestyle: no regular exercise; trying to eat healthy      [facility] (09/11/16):   Originally from [place] [facility]. Has always lived in [name]. No pets currently. Does have carpet in her home in her bedroom. No bird exposure. Previously had mold under her kitchen sink. No indoor plants. Previously was working in [employer].          Social Drivers of Corporate investment banker Strain: Low Risk  (04/06/2023)   Overall Financial Resource Strain (CARDIA)    Difficulty of Paying Living Expenses: Not hard at all  Food Insecurity: No Food Insecurity (05/01/2023)   Hunger Vital Sign    Worried About Running Out of Food in the Last Year: Never true    Ran Out of Food in the Last Year: Never true  Transportation Needs: No Transportation Needs (05/01/2023)   PRAPARE - Administrator, Civil Service (Medical): No    Lack of Transportation (Non-Medical): No  Physical Activity: Insufficiently Active (04/06/2023)   Exercise Vital Sign    Days of Exercise per Week: 3 days    Minutes of Exercise per  Session: 40 min  Stress: No Stress Concern Present (04/06/2023)   [employer] of Occupational Health - Occupational Stress Questionnaire    Feeling of Stress : Not at all  Social Connections: Moderately Integrated (05/01/2023)   Social Connection and Isolation Panel    Frequency of Communication with Friends and Family: More than three times a week    Frequency of Social Gatherings with Friends and Family: More than three times a week    Attends Religious Services: More than 4 times per year    Active Member of [employer] or Organizations: No    Attends Banker Meetings: Never    Marital Status: Married  Catering manager Violence: Not At Risk (05/01/2023)   Humiliation, Afraid, Rape, and Kick questionnaire    Fear of Current or Ex-Partner: No    Emotionally Abused: No    Physically Abused: No    Sexually Abused: No    Physical Exam: Vital signs in last 24 hours: BP 114/64   Temp (!) 97.4 F (36.3 C) (Temporal)   Resp (!) 21   Ht 5' (1.524 m)   Wt (!) 136.5 kg   SpO2 94%   BMI 58.79 kg/m  GEN: NAD EYE: Sclerae anicteric ENT: MMM CV: Non-tachycardic Pulm: No increased WOB GI: Soft NEURO:  Alert & Oriented   Estefana Kidney, MD [facility]   09/22/2023 10:02 AM

## 2023-09-22 NOTE — Anesthesia Preprocedure Evaluation (Addendum)
 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  1. The LV has normal function, no regional wall motion abnormalities. Left ventricular diastolic parameters were normal.   2. RVF is normal. The right ventricular size is normal. There is normal pulmonary artery systolic pressure.   3. The mitral valve is grossly normal. No evidence of mitral valve regurgitation. No evidence of mitral stenosis.   4. The aortic valve is tricuspid. Aortic valve regurgitation is not visualized. No aortic stenosis is present.     Neuro/Psych  Headaches  Anxiety Depression    Myasthenia gravis: followed at [facility], tapered off Prednisone  and Mestinon  since November 2018, taking only mycophenolate  mofetil and CellCept , well-controlled    GI/Hepatic ,GERD  Medicated and Controlled,,H/o colon cancer   Endo/Other  diabetes, Oral Hypoglycemic Agents  Mounjaro : 10d ago BMI 59  Renal/GU      Musculoskeletal  (+) Arthritis ,    Abdominal   Peds  Hematology  (+) Blood dyscrasia (Hb 11.1, plt 284k), anemia   Anesthesia Other Findings   Reproductive/Obstetrics                              Anesthesia Physical Anesthesia Plan  ASA: 4  Anesthesia Plan: MAC   Post-op Pain Management: Minimal  or no pain anticipated   Induction:   PONV Risk Score and Plan: 2 and Treatment may vary due to age or medical condition  Airway Management Planned: Natural Airway and Simple Face Mask  Additional Equipment: None  Intra-op Plan:   Post-operative Plan:   Informed Consent: I have reviewed the patients History and Physical, chart, labs and discussed the procedure including the risks, benefits and alternatives for the proposed anesthesia with the patient or authorized representative who has indicated his/her understanding and acceptance.     Dental advisory given  Plan Discussed with: CRNA and Surgeon  Anesthesia Plan Comments:          Anesthesia Quick Evaluation

## 2023-09-22 NOTE — Anesthesia Postprocedure Evaluation (Signed)
 Anesthesia Post Note  Patient: Frances Medina  Procedure(s) Performed: COLONOSCOPY EGD (ESOPHAGOGASTRODUODENOSCOPY)     Patient location during evaluation: Endoscopy Anesthesia Type: MAC Level of consciousness: oriented, patient cooperative and awake and alert Pain management: pain level controlled Vital Signs Assessment: post-procedure vital signs reviewed and stable Respiratory status: nonlabored ventilation, spontaneous breathing and respiratory function stable Cardiovascular status: blood pressure returned to baseline and stable Postop Assessment: no apparent nausea or vomiting and adequate PO intake Anesthetic complications: no   No notable events documented.  Last Vitals:  Vitals:   09/22/23 1210 09/22/23 1220  BP: 112/69 115/74  Pulse: 91 84  Resp: 14 15  Temp:    SpO2: 97% 98%    Last Pain:  Vitals:   09/22/23 1210  TempSrc:   PainSc: 0-No pain                 Fredrik Mogel,E. Desteni Piscopo

## 2023-09-22 NOTE — Op Note (Signed)
 [facility] Patient Name: Frances Medina Procedure Date: 09/22/2023 MRN: [id number] Attending MD: Rosario Estefana Kidney , , [id number] Date of Birth: [birth year] CSN: [id number] Age: 62 Admit Type: Outpatient Procedure:                Colonoscopy Indications:              Iron  deficiency anemia Providers:                Rosario Estefana Kidney, Particia Fischer, RN, Corene Southgate, Technician Referring MD:             [name] MD, MD Medicines:                Monitored Anesthesia Care Complications:            No immediate complications. Estimated Blood Loss:     Estimated blood loss was minimal. Procedure:                Pre-Anesthesia Assessment:                           - Prior to the procedure, a History and Physical                            was performed, and patient medications and                            allergies were reviewed. The patient's tolerance of                            previous anesthesia was also reviewed. The risks                            and benefits of the procedure and the sedation                            options and risks were discussed with the patient.                            All questions were answered, and informed consent                            was obtained. Prior Anticoagulants: The patient has                            taken no anticoagulant or antiplatelet agents. ASA                            Grade Assessment: III - A patient with severe                            systemic disease. After reviewing the risks and  benefits, the patient was deemed in satisfactory                            condition to undergo the procedure.                           After obtaining informed consent, the colonoscope                            was passed under direct vision. Throughout the                            procedure, the patient's blood pressure, pulse, and                             oxygen saturations were monitored continuously. The                            CF-HQ190L ([id number]) Olympus colonoscope was                            introduced through the anus and advanced to the the                            terminal ileum. The colonoscopy was performed                            without difficulty. The patient tolerated the                            procedure well. The quality of the bowel                            preparation was good. The terminal ileum, ileocecal                            valve, appendiceal orifice, and rectum were                            photographed. Scope In: 11:33:52 AM Scope Out: 11:50:09 AM Scope Withdrawal Time: 0 hours 13 minutes 56 seconds  Total Procedure Duration: 0 hours 16 minutes 17 seconds  Findings:      The terminal ileum appeared normal.      A 4 mm polyp was found in the ascending colon. The polyp was sessile.       The polyp was removed with a cold snare. Resection and retrieval were       complete.      Two sessile polyps were found in the rectum. The polyps were 3 to 5 mm       in size. These polyps were removed with a cold snare. Resection and       retrieval were complete.      Non-bleeding internal hemorrhoids were found during retroflexion. Impression:               - The examined portion of the ileum  was normal.                           - One 4 mm polyp in the ascending colon, removed                            with a cold snare. Resected and retrieved.                           - Two 3 to 5 mm polyps in the rectum, removed with                            a cold snare. Resected and retrieved.                           - Non-bleeding internal hemorrhoids. Moderate Sedation:      Not Applicable - Patient had care per Anesthesia. Recommendation:           - Discharge patient to home (with escort).                           - It is suspected that your iron  deficiency anemia                            may be  related to gastritis.                           - Await pathology results.                           - The findings and recommendations were discussed                            with the patient. Procedure Code(s):        --- Professional ---                           [phone], Colonoscopy, flexible; with removal of                            tumor(s), polyp(s), or other lesion(s) by snare                            technique Diagnosis Code(s):        --- Professional ---                           K64.8, Other hemorrhoids                           D12.2, Benign neoplasm of ascending colon                           D12.8, Benign neoplasm of rectum  D50.9, Iron  deficiency anemia, unspecified CPT copyright 2022 American Medical Association. All rights reserved. The codes documented in this report are preliminary and upon coder review may  be revised to meet current compliance requirements. Dr Estefana Federico Rosario Estefana Federico,  09/22/2023 11:58:09 AM Number of Addenda: 0

## 2023-09-22 NOTE — Transfer of Care (Signed)
 Immediate Anesthesia Transfer of Care Note  Patient: Frances Medina  Procedure(s) Performed: COLONOSCOPY EGD (ESOPHAGOGASTRODUODENOSCOPY)  Patient Location: PACU  Anesthesia Type:MAC  Level of Consciousness: drowsy  Airway & Oxygen Therapy: Patient Spontanous Breathing  Post-op Assessment: Report given to RN  Post vital signs: Reviewed and stable  Last Vitals:  Vitals Value Taken Time  BP 115/59 09/22/23 12:00  Temp    Pulse 98 09/22/23 12:00  Resp 22 09/22/23 12:00  SpO2 96 % 09/22/23 12:00  Vitals shown include unfiled device data.  Last Pain:  Vitals:   09/22/23 0923  TempSrc: Temporal  PainSc: 0-No pain         Complications: No notable events documented.

## 2023-09-22 NOTE — Discharge Instructions (Signed)

## 2023-09-22 NOTE — Op Note (Signed)
 [facility] Patient Name: Frances Medina Procedure Date: 09/22/2023 MRN: [id number] Attending MD: Rosario Estefana Kidney , , [id number] Date of Birth: 1960/06/18 CSN: [id number] Age: 63 Admit Type: Outpatient Procedure:                Upper GI endoscopy Indications:              Epigastric abdominal pain, Iron  deficiency anemia,                            Dysphagia, Odynophagia, Heartburn Providers:                Rosario Estefana Kidney Particia Bernardo, RN, Corene Southgate, Technician Referring MD:             [name] MD, MD Medicines:                Monitored Anesthesia Care Complications:            No immediate complications. Estimated Blood Loss:     Estimated blood loss was minimal. Procedure:                Pre-Anesthesia Assessment:                           - Prior to the procedure, a History and Physical                            was performed, and patient medications and                            allergies were reviewed. The patient's tolerance of                            previous anesthesia was also reviewed. The risks                            and benefits of the procedure and the sedation                            options and risks were discussed with the patient.                            All questions were answered, and informed consent                            was obtained. Prior Anticoagulants: The patient has                            taken no anticoagulant or antiplatelet agents. ASA                            Grade Assessment: III - A patient with severe  systemic disease. After reviewing the risks and                            benefits, the patient was deemed in satisfactory                            condition to undergo the procedure.                           After obtaining informed consent, the endoscope was                            passed under direct vision. Throughout the                             procedure, the patient's blood pressure, pulse, and                            oxygen saturations were monitored continuously. The                            [id number]-H190 ([id number]) Olympus endoscope was introduced                            through the mouth, and advanced to the second part                            of duodenum. The upper GI endoscopy was                            accomplished without difficulty. The patient                            tolerated the procedure well. Scope In: Scope Out: Findings:      One benign-appearing, intrinsic moderate (circumferential scarring or       stenosis; an endoscope may pass) stenosis was found in the proximal       esophagus. This stenosis measured 2 cm (in length). The stenosis was       traversed. A guidewire was placed and the scope was withdrawn. Dilation       was performed with a Savary dilator with mild resistance at 18 mm. The       dilation site was examined following endoscope reinsertion and showed       mild mucosal disruption.      Biopsies were taken with a cold forceps in the proximal esophagus and in       the distal esophagus for histology.      A small hiatal hernia was present.      Localized inflammation characterized by adherent blood, congestion       (edema), erosions and erythema was found in the gastric antrum. Biopsies       were taken with a cold forceps for histology.      The examined duodenum was normal. Impression:               - Benign-appearing esophageal stenosis. Dilated.                           -  Hiatal hernia.                           - Gastritis. Biopsied.                           - Normal examined duodenum.                           - Biopsies were taken with a cold forceps for                            histology in the proximal esophagus and in the                            distal esophagus. Moderate Sedation:      Not Applicable - Patient had care per  Anesthesia. Recommendation:           - Await pathology results.                           - Increase Nexium  to 40 mg BID for 8 weeks, then QD.                           - Return to GI clinic in 2-3 months.                           - Perform a colonoscopy today. Procedure Code(s):        --- Professional ---                           [phone], Esophagogastroduodenoscopy, flexible,                            transoral; with insertion of guide wire followed by                            passage of dilator(s) through esophagus over guide                            wire                           43239, 59, Esophagogastroduodenoscopy, flexible,                            transoral; with biopsy, single or multiple Diagnosis Code(s):        --- Professional ---                           K22.2, Esophageal obstruction                           K44.9, Diaphragmatic hernia without obstruction or                            gangrene  K29.70, Gastritis, unspecified, without bleeding                           R10.13, Epigastric pain                           D50.9, Iron  deficiency anemia, unspecified                           R13.10, Dysphagia, unspecified                           R12, Heartburn CPT copyright 2022 American Medical Association. All rights reserved. The codes documented in this report are preliminary and upon coder review may  be revised to meet current compliance requirements. Dr Estefana Federico Rosario Estefana Federico,  09/22/2023 11:31:25 AM Number of Addenda: 0

## 2023-09-23 ENCOUNTER — Ambulatory Visit: Payer: Self-pay | Admitting: Internal Medicine

## 2023-09-23 LAB — SURGICAL PATHOLOGY

## 2023-09-24 ENCOUNTER — Telehealth: Payer: Self-pay

## 2023-09-24 NOTE — Telephone Encounter (Signed)
 Recommendation from 7/22 EGD:  - Increase Nexium  to 40 mg BID for 8 weeks, then QD. Prescription was sent on 7/22 - Return to GI clinic in 2- 3 months. 3 month follow up scheduled with Alan, [place] on Monday, 12/28/23 at 9:20 am. Appt information mailed and sent to patient via MyChart.

## 2023-09-25 ENCOUNTER — Encounter (HOSPITAL_COMMUNITY): Payer: Self-pay | Admitting: Internal Medicine

## 2023-09-28 ENCOUNTER — Encounter: Payer: Self-pay | Admitting: Family Medicine

## 2023-09-28 NOTE — Telephone Encounter (Signed)
 Patient is scheduled for an telephone visit tomorrow at 830.

## 2023-09-29 ENCOUNTER — Telehealth: Admitting: Family Medicine

## 2023-09-29 ENCOUNTER — Encounter: Payer: Self-pay | Admitting: Family Medicine

## 2023-09-29 VITALS — Wt 301.0 lb

## 2023-09-29 DIAGNOSIS — E1169 Type 2 diabetes mellitus with other specified complication: Secondary | ICD-10-CM | POA: Diagnosis not present

## 2023-09-29 DIAGNOSIS — R600 Localized edema: Secondary | ICD-10-CM | POA: Diagnosis not present

## 2023-09-29 DIAGNOSIS — Z6841 Body Mass Index (BMI) 40.0 and over, adult: Secondary | ICD-10-CM

## 2023-09-29 DIAGNOSIS — L501 Idiopathic urticaria: Secondary | ICD-10-CM | POA: Diagnosis not present

## 2023-09-29 DIAGNOSIS — Z7985 Long-term (current) use of injectable non-insulin antidiabetic drugs: Secondary | ICD-10-CM

## 2023-09-29 MED ORDER — PREDNISONE 20 MG PO TABS: 20.0000 mg | ORAL_TABLET | Freq: Every day | ORAL | 0 refills | Status: AC

## 2023-09-29 MED ORDER — OZEMPIC (0.25 OR 0.5 MG/DOSE) 2 MG/3ML ~~LOC~~ SOPN: 0.5000 mg | PEN_INJECTOR | SUBCUTANEOUS | 1 refills | Status: DC

## 2023-09-29 NOTE — Assessment & Plan Note (Signed)
 D/C Mounjaro  due to skin rash Start Ozempic  1 week after being off Mounjaro  Plan to d/c Ozempic  if having s/es as well All questions were answered

## 2023-09-29 NOTE — Progress Notes (Signed)
 [facility] Telemedicine Visit  Patient consented to have virtual visit and was identified by name and date of birth. Method of visit: Video  Encounter participants: Patient: Frances Medina - located at Home Provider: Otto Fairly - located at [facility] office Others (if applicable): N/A  Chief Complaint: Skin rash/Leg swelling  HPI: Skin lesion: C/O Itchy rash on her legs B/L, which started when she resumed her Mounjaro . She was taken off Mounjaro  x 1 week in preparation for endoscopy. During that time, she did not experience any rash. However, when she resumed her Mounjaro , her LL rash started popping up again. Rash is on her shins and calves.  Leg swelling: C/O B/L leg swelling has been ongoing for a few days. Denies any pain.   DM2/Weight Management: On Mounjaro  5 mg weekly. Last dose was 11/28/23. She does not want to continue Mounjaro  due to her skin rash. She did well in the past on Wegovy and wonders if she can switch back to Semaglutide .  ROS: per HPI  Pertinent PMHx: PMHx reviewed  Exam:  Wt (!) 301 lb (136.5 kg) Comment: 1 week ago  BMI 58.79 kg/m   Physical Exam Vitals reviewed.  Constitutional:      Appearance: Normal appearance. She is obese. She is not ill-appearing.  Pulmonary:     Effort: Pulmonary effort is normal. No respiratory distress.  Skin:    Comments: Scattered macular rash on her shin b/l, difficult  Neurological:     Mental Status: She is alert and oriented to person, place, and time.  Psychiatric:        Mood and Affect: Mood normal.        Behavior: Behavior normal.      Assessment/Plan:  Idiopathic urticaria ?? Related to Mounjaro  I discussed this in the past and she is interested in getting off Mounjaro  and transition to Semaglutide  which she did well on in the past Oral steroid escribed Continue home antihistamine F/U soon if there is no improvement.   Leg edema Unable to fully assess on video visit F/U  appointment made - in-person She agreed with follow up plan  DM2 (diabetes mellitus, type 2) (HCC) D/C Mounjaro  due to skin rash Start Ozempic  1 week after being off Mounjaro  Plan to d/c Ozempic  if having s/es as well All questions were answered   BMI 50.0-59.9, adult (HCC) Lost a few pounds since last visit while on Mounjaro   Transition to Ozempic  due to intolerance Monitor closely    Time spent during visit with patient: 20 minutes

## 2023-09-29 NOTE — Assessment & Plan Note (Signed)
??   Related to Mounjaro  I discussed this in the past and she is interested in getting off Mounjaro  and transition to Semaglutide  which she did well on in the past Oral steroid escribed Continue home antihistamine F/U soon if there is no improvement.

## 2023-09-29 NOTE — Patient Instructions (Signed)
 Hives Hives are itchy, red, swollen areas on your skin. They can show up on any part of your body. They often go away within 24 hours (acute hives). If you get new hives after the old ones fade and this goes on for many days or weeks, it is called chronic hives. Hives do not spread from person to person (are not contagious). Hives can happen when your body reacts to something that you are allergic to (allergen). These are sometimes called triggers. You can get hives right after being around a trigger, or hours later. What are the causes? Food allergies. Insect bites or stings. Allergies to pollen or pets. Spending time in sunlight, heat, or cold. Exercise. Stress. Other causes, such as: Viruses. This includes the common cold. Infections caused by germs (bacteria). Some medicines. Chemicals or latex. Allergy shots. Blood transfusions. In some cases, the cause is not known. What increases the risk? Being female. Being allergic to foods, such as: Citrus fruits. Milk. Eggs. Peanuts. Tree nuts. Shellfish. Being allergic to: Medicines. Latex. Insects. Animals. Pollen. What are the signs or symptoms?  Itchy, red or white bumps or spots on your skin. These areas may: Swell and get bigger. Change in shape and location. Stand alone or connect to each other over a large area of skin. Sting or hurt. Turn white when pressed in the center (blanch). In very bad cases, your hands, feet, and face may also swell. This may happen if hives start deeper in your skin. How is this treated? Treatment for hives depends on your symptoms. You may need to: Use cool, wet cloths (cool compresses) or take cool showers to stop the itching. Take or apply medicines to: Help with itching (antihistamines). Lessen swelling (corticosteroids). Treat infection (antibiotics). Have a medicine called omalizumab given to you as a shot. You may need this if your hives do not get better with other treatments. In  very bad cases, you may need to use a device filled with medicine that gives an emergency shot of epinephrine (auto-injector pen) to stop a very bad allergic reaction (anaphylactic reaction). Follow these instructions at home: Medicines Take or apply over-the-counter and prescription medicines only as told by your doctor. If you were prescribed antibiotics, use them as told by your doctor. Do not stop using them even if you start to feel better. Skin care Put cool, wet cloths on the hives. Do not scratch your skin. Do not rub your skin. General instructions Do not take hot showers or baths. This can make itching worse. Do not wear tight clothes. Use sunscreen. Wear clothes that cover your skin when you are outside. Avoid triggers that cause your hives. Keep a journal to help track what causes your hives. Write down: What medicines you take. What you eat and drink. What you put on your skin. Keep all follow-up visits. Your doctor will need to make sure treatment is working. Contact a doctor if: Your symptoms do not get better with medicine. Your joints hurt or swell. You have a fever. You have pain in your belly (abdomen). Get help right away if: Your tongue or lips swell. Your eyelids are swollen. Your chest or throat feels tight. You have trouble breathing or swallowing. These symptoms may be an emergency. Get help right away. Call 911. Do not wait to see if the symptoms will go away. Do not drive yourself to the hospital. This information is not intended to replace advice given to you by your health care provider. Make sure  you discuss any questions you have with your health care provider. Document Revised: 11/05/2021 Document Reviewed: 11/05/2021 Elsevier Patient Education  2024 [employer].

## 2023-09-29 NOTE — Assessment & Plan Note (Signed)
 Lost a few pounds since last visit while on Mounjaro   Transition to Ozempic  due to intolerance Monitor closely

## 2023-09-29 NOTE — Assessment & Plan Note (Signed)
 Unable to fully assess on video visit F/U appointment made - in-person She agreed with follow up plan

## 2023-09-30 ENCOUNTER — Telehealth: Payer: Self-pay

## 2023-09-30 NOTE — Telephone Encounter (Signed)
 Pharmacy Patient Advocate Encounter   Received notification from CoverMyMeds that prior authorization for OZEMPIC  0.25/0.5MG  is required/requested.   Insurance verification completed.   The patient is insured through [facility] .   PA required; PA submitted to above mentioned insurance via CoverMyMeds Key/confirmation #/EOC [id number]. Status is pending

## 2023-09-30 NOTE — Telephone Encounter (Signed)
 Pharmacy Patient Advocate Encounter  Received notification from [facility] that Prior Authorization for OZEMPIC  0.25/0.5MG  has been APPROVED from 09/30/23 to 09/30/26

## 2023-10-02 LAB — HM DIABETES EYE EXAM

## 2023-10-05 ENCOUNTER — Ambulatory Visit (INDEPENDENT_AMBULATORY_CARE_PROVIDER_SITE_OTHER): Admitting: Family Medicine

## 2023-10-05 ENCOUNTER — Encounter: Payer: Self-pay | Admitting: Family Medicine

## 2023-10-05 VITALS — BP 134/96 | HR 74 | Ht 60.0 in | Wt 307.5 lb

## 2023-10-05 DIAGNOSIS — E1169 Type 2 diabetes mellitus with other specified complication: Secondary | ICD-10-CM

## 2023-10-05 DIAGNOSIS — L501 Idiopathic urticaria: Secondary | ICD-10-CM | POA: Diagnosis not present

## 2023-10-05 DIAGNOSIS — Z23 Encounter for immunization: Secondary | ICD-10-CM

## 2023-10-05 DIAGNOSIS — I10 Essential (primary) hypertension: Secondary | ICD-10-CM | POA: Diagnosis not present

## 2023-10-05 DIAGNOSIS — M7989 Other specified soft tissue disorders: Secondary | ICD-10-CM

## 2023-10-05 DIAGNOSIS — M25552 Pain in left hip: Secondary | ICD-10-CM

## 2023-10-05 LAB — POCT GLYCOSYLATED HEMOGLOBIN (HGB A1C): HbA1c, POC (controlled diabetic range): 5.9 % (ref 0.0–7.0)

## 2023-10-05 MED ORDER — LISINOPRIL 2.5 MG PO TABS: 2.5000 mg | ORAL_TABLET | Freq: Every day | ORAL | 1 refills | Status: DC

## 2023-10-05 NOTE — Assessment & Plan Note (Signed)
 Chronic skin rash, likely allergic etiology Chronic skin rash likely allergic, improved after stopping Mounjaro . No adverse reaction to Ozempic . - Discontinue oral steroid. - Monitor skin rash response to Ozempic .

## 2023-10-05 NOTE — Patient Instructions (Signed)
 Peripheral Edema  Peripheral edema is swelling that is caused by a buildup of fluid. Peripheral edema most often affects the lower legs, ankles, and feet. It can also develop in the arms, hands, and face. The area of the body that has peripheral edema will look swollen. It may also feel heavy or warm. Your clothes may start to feel tight. Pressing on the area may make a temporary dent in your skin (pitting edema). You may not be able to move your swollen arm or leg as much as usual. There are many causes of peripheral edema. It can happen because of a complication of other conditions such as heart failure, kidney disease, or a problem with your circulation. It also can be a side effect of certain medicines or happen because of an infection. It often happens to women during pregnancy. Sometimes, the cause is not known. Follow these instructions at home: Managing pain, stiffness, and swelling  Raise (elevate) your legs while you are sitting or lying down. Move around often to prevent stiffness and to reduce swelling. Do not sit or stand for long periods of time. Do not wear tight clothing. Do not wear garters on your upper legs. Exercise your legs to get your circulation going. This helps to move the fluid back into your blood vessels, and it may help the swelling go down. Wear compression stockings as told by your health care provider. These stockings help to prevent blood clots and reduce swelling in your legs. It is important that these are the correct size. These stockings should be prescribed by your doctor to prevent possible injuries. If elastic bandages or wraps are recommended, use them as told by your health care provider. Medicines Take over-the-counter and prescription medicines only as told by your health care provider. Your health care provider may prescribe medicine to help your body get rid of excess water (diuretic). Take this medicine if you are told to take it. General  instructions Eat a low-salt (low-sodium) diet as told by your health care provider. Sometimes, eating less salt may reduce swelling. Pay attention to any changes in your symptoms. Moisturize your skin daily to help prevent skin from cracking and draining. Keep all follow-up visits. This is important. Contact a health care provider if: You have a fever. You have swelling in only one leg. You have increased swelling, redness, or pain in one or both of your legs. You have drainage or sores at the area where you have edema. Get help right away if: You have edema that starts suddenly or is getting worse, especially if you are pregnant or have a medical condition. You develop shortness of breath, especially when you are lying down. You have pain in your chest or abdomen. You feel weak. You feel like you will faint. These symptoms may be an emergency. Get help right away. Call 911. Do not wait to see if the symptoms will go away. Do not drive yourself to the hospital. Summary Peripheral edema is swelling that is caused by a buildup of fluid. Peripheral edema most often affects the lower legs, ankles, and feet. Move around often to prevent stiffness and to reduce swelling. Do not sit or stand for long periods of time. Pay attention to any changes in your symptoms. Contact a health care provider if you have edema that starts suddenly or is getting worse, especially if you are pregnant or have a medical condition. Get help right away if you develop shortness of breath, especially when lying down.  This information is not intended to replace advice given to you by your health care provider. Make sure you discuss any questions you have with your health care provider. Document Revised: 10/22/2020 Document Reviewed: 10/22/2020 Elsevier Patient Education  2024 [employer].

## 2023-10-05 NOTE — Progress Notes (Signed)
 SUBJECTIVE:   CHIEF COMPLAINT / HPI:   History of Present Illness Frances Medina is a 63 year old female with diabetes and hypertension who presents with a skin rash and elevated blood pressure.  She has a recurrent skin rash, previously associated with Mounjaro , which she discontinued. She is allergic to grass, trees, carpet, and dust mites. She started Ozempic  0.5 mg yesterday. She reports improvement in her skin condition after stopping Mounjaro .  She has a history of elevated blood pressure, with a reading of 140/79 today. Her blood pressure was also noted to be elevated during a recent eye exam. She drinks beet juice regularly, which she believes helps manage her blood pressure. She is not currently on any blood pressure medication.  She reports a sinus infection with symptoms including a sore throat on the right side, post-nasal drainage, and a persistent cough that kept her awake last night. These symptoms began last night, and she woke up with a stuffy nose and throat soreness.  She has a history of diabetes, with her A1c recently measured at 5.9. She discontinued Mounjaro  and metformin and started Ozempic  0.5 mg yesterday. She reports no side effects from Ozempic .  She experiences intermittent swelling in her left ankle, which has been a long-standing issue. The swelling was noted to be more pronounced recently, prompting her to resume wearing compression stockings, which helped reduce the swelling. She reports no current pain but notes that the ankle becomes tight and turns purple when swollen.  She describes experiencing sharp pain in her left groin, which occurs sporadically, particularly when walking or performing activities in the kitchen. The pain can be severe enough to stop her in her tracks. She associates this pain with arthritis and mentions a history of bursitis in the area.  Her current medications include Tylenol  as needed, albuterol  as needed, Klonopin  0.5 mg, B12,  Epipen  as needed, Nexium  40 mg, famotidine  as needed, Allegra  180 mg daily, Flonase , alobetasol cream, Singulair  10 mg at bedtime, CellCept , Ozempic  0.5 mg, Zoloft  150 mg daily, Vitamin D , and Wixela inhaler.  Medications - Tylenol  (as needed) - Albuterol  (as needed) - Steroid - Klonopin  0.5 mg - Vitamin B12 - Epipen  (as needed) - Nexium  40 mg - Famotidine  (as needed) - Pepcid  (as needed) - Allegra  180 mg daily - Flonase  - Albetasol cream - Metformin (stopped) - Singulair  10 mg at bedtime - CellCept  - Semaglutide  - Ozempic  0.5 mg - Zoloft  50 mg - Vitamin D  - Wixela inhaler  Physical Exam VITALS: BP- 134/96 HEENT: Ears without drainage. Throat without redness. NECK: No cervical lymphadenopathy. CHEST: Lungs clear to auscultation bilaterally. CARDIOVASCULAR: Heart regular rate and rhythm, no murmurs. ABDOMEN: Normal bowel sounds, abdomen non-tender. EXTREMITIES: Left ankle puffy, no edema. 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 No adverse reaction to Ozempic . - Discontinue oral steroid. - Monitor skin rash response to Ozempic .  Type 2 diabetes mellitus Type 2 diabetes mellitus with improved glycemic control. Recent A1c is 5.9. - Continue Ozempic  0.5 mg for four weeks, then increase dose. - Monitor weight loss and glycemic control.  Hypertension Hypertension with current readings of 140/79 and 134/96. Consideration of kidney protective medication due to diabetes. - Consider starting losartan 25 mg or lisinopril  after reviewing safety with myasthenia  gravis. - Recheck blood pressure. - Lisinopril  is safe and covered by insurance - start 2.5 mg every day. Will notify the patient as discussed.  Left ankle swelling Intermittent left ankle  swelling with no fluid retention. Reduced with compression stockings. - Continue use of compression stockings. - Monitor salt intake to reduce water  retention.  Left hip/groin pain, likely osteoarthritis or bursitis Left hip/groin pain likely due to osteoarthritis or bursitis. Previous x-ray showed moderate to severe arthritis. - Order x-ray of left hip. - Consider referral to physical therapy based on x-ray results.  Allergic rhinitis with postnasal drip Symptoms of postnasal drip and sore throat, less likely due to sinus infection. - Throat exam benign - Continue current allergy  regimen - Return son if persistent

## 2023-10-05 NOTE — Assessment & Plan Note (Signed)
 Type 2 diabetes mellitus with improved glycemic control. Recent A1c is 5.9. - Continue Ozempic  0.5 mg for four weeks, then increase dose. - Monitor weight loss and glycemic control.

## 2023-10-08 ENCOUNTER — Ambulatory Visit (INDEPENDENT_AMBULATORY_CARE_PROVIDER_SITE_OTHER)

## 2023-10-08 DIAGNOSIS — L501 Idiopathic urticaria: Secondary | ICD-10-CM | POA: Diagnosis not present

## 2023-10-09 ENCOUNTER — Ambulatory Visit (HOSPITAL_COMMUNITY)
Admission: RE | Admit: 2023-10-09 | Discharge: 2023-10-09 | Disposition: A | Discharge status: Home / Self Care | Source: Ambulatory Visit | Attending: Family Medicine | Admitting: Family Medicine

## 2023-10-09 DIAGNOSIS — M25552 Pain in left hip: Secondary | ICD-10-CM | POA: Diagnosis present

## 2023-10-19 ENCOUNTER — Telehealth: Payer: Self-pay

## 2023-10-19 NOTE — Telephone Encounter (Signed)
 Spoke to patient requested to bring cpap to Appt.

## 2023-10-21 ENCOUNTER — Ambulatory Visit (INDEPENDENT_AMBULATORY_CARE_PROVIDER_SITE_OTHER): Admitting: Internal Medicine

## 2023-10-21 ENCOUNTER — Ambulatory Visit (INDEPENDENT_AMBULATORY_CARE_PROVIDER_SITE_OTHER): Admitting: Pulmonary Disease

## 2023-10-21 ENCOUNTER — Ambulatory Visit: Payer: Self-pay | Admitting: Family Medicine

## 2023-10-21 ENCOUNTER — Encounter: Payer: Self-pay | Admitting: Pulmonary Disease

## 2023-10-21 ENCOUNTER — Other Ambulatory Visit: Payer: Self-pay

## 2023-10-21 ENCOUNTER — Encounter: Payer: Self-pay | Admitting: Internal Medicine

## 2023-10-21 VITALS — BP 134/86 | HR 86 | Temp 98.1°F

## 2023-10-21 VITALS — BP 142/79 | HR 72 | Ht 60.0 in | Wt 309.6 lb

## 2023-10-21 DIAGNOSIS — L501 Idiopathic urticaria: Secondary | ICD-10-CM

## 2023-10-21 DIAGNOSIS — J310 Chronic rhinitis: Secondary | ICD-10-CM

## 2023-10-21 DIAGNOSIS — T781XXD Other adverse food reactions, not elsewhere classified, subsequent encounter: Secondary | ICD-10-CM | POA: Diagnosis not present

## 2023-10-21 DIAGNOSIS — J4520 Mild intermittent asthma, uncomplicated: Secondary | ICD-10-CM | POA: Diagnosis not present

## 2023-10-21 LAB — PULMONARY FUNCTION TEST
DL/VA % pred: 135 %
DL/VA: 5.87 ml/min/mmHg/L
DLCO cor % pred: 106 %
DLCO cor: 18.65 ml/min/mmHg
DLCO unc % pred: 100 %
DLCO unc: 17.59 ml/min/mmHg
FEF 25-75 Post: 2.01 L/s
FEF 25-75 Pre: 2 L/s
FEF2575-%Change-Post: 0 %
FEF2575-%Pred-Post: 98 %
FEF2575-%Pred-Pre: 97 %
FEV1-%Change-Post: 1 %
FEV1-%Pred-Post: 72 %
FEV1-%Pred-Pre: 72 %
FEV1-Post: 1.56 L
FEV1-Pre: 1.54 L
FEV1FVC-%Change-Post: 3 %
FEV1FVC-%Pred-Pre: 107 %
FEV6-%Change-Post: -2 %
FEV6-%Pred-Post: 67 %
FEV6-%Pred-Pre: 69 %
FEV6-Post: 1.8 L
FEV6-Pre: 1.84 L
FEV6FVC-%Pred-Post: 103 %
FEV6FVC-%Pred-Pre: 103 %
FVC-%Change-Post: -2 %
FVC-%Pred-Post: 64 %
FVC-%Pred-Pre: 66 %
FVC-Post: 1.8 L
FVC-Pre: 1.84 L
Post FEV1/FVC ratio: 86 %
Post FEV6/FVC ratio: 100 %
Pre FEV1/FVC ratio: 83 %
Pre FEV6/FVC Ratio: 100 %
RV % pred: 91 %
RV: 1.67 L
TLC % pred: 83 %
TLC: 3.7 L

## 2023-10-21 MED ORDER — FLUTICASONE PROPIONATE 50 MCG/ACT NA SUSP
2.0000 | Freq: Every day | NASAL | 5 refills | Status: AC
  Filled 2023-12-09: qty 16, 30d supply, fill #0

## 2023-10-21 MED ORDER — FAMOTIDINE 20 MG PO TABS: 20.0000 mg | ORAL_TABLET | Freq: Two times a day (BID) | ORAL | 5 refills | Status: AC | PRN

## 2023-10-21 MED ORDER — FEXOFENADINE HCL 180 MG PO TABS: 180.0000 mg | ORAL_TABLET | Freq: Two times a day (BID) | ORAL | 5 refills | Status: DC

## 2023-10-21 MED ORDER — MONTELUKAST SODIUM 10 MG PO TABS
10.0000 mg | ORAL_TABLET | Freq: Every day | ORAL | 1 refills | Status: AC
  Filled 2023-12-09 – 2024-03-01 (×2): qty 90, 90d supply, fill #0

## 2023-10-21 NOTE — Progress Notes (Signed)
 Full pft performed today

## 2023-10-21 NOTE — Patient Instructions (Addendum)
 Your breathing tests are overall normal  Continue wixella 1 puff twice daily  Continue ozempic  for weight loss  Follow up in 6 months

## 2023-10-21 NOTE — Patient Instructions (Signed)
 Full pft performed today

## 2023-10-21 NOTE — Progress Notes (Signed)
 Synopsis: Referred in November 2023 for cough and shortness of breath by Frances Fairly, MD  Subjective:   PATIENT ID: Frances Medina GENDER: female DOB: November 17, 1960, MRN: [id number]  HPI  Chief Complaint  Patient presents with   Medical Management of Chronic Issues    Post PFT    Frances Medina is a 63 year old woman, former smoker with GERD, hypertension, OSA on CPAP, seasonal allergies, positive ANA in 2017, myasthenia gravis and asthma who returns to pulmonary clinic for cough and dyspnea.   She has been doing well since last visit.  PFTs show reduced FEV1 and FVC but normal TLC and DLCO.   Compliance download shows perfect use of her CPAP.  She feels fatigued and possibly coming down with a cold.   OV 08/27/23 Patient treated for exacerbation of her breathing at urgent care 08/12/23.  PFTs from April Allergy /Immunology visit show possible restrictive defect.   She experiences breathing difficulties, which improved with the daily use of a Wixela inhaler. No recent use of prednisone  or other steroids. 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 She uses a CPAP machine, which she suspects might contribute to her skin issues.  She has a widespread itchy and burning skin rash since early January, covering her entire body. Cold chills precede its appearance. Allegra  and various lotions provide only temporary relief. The rash began before starting Mounjaro  for weight management. Environmental allergy  testing returned negative results.  OV 05/27/23 She has been experiencing persistent wheezing since February 10, 2023, following an initial diagnosis of coronavirus and a subsequent diagnosis of RSV a couple days ago. The wheezing has not resolved despite treatment and continues to affect her daily life. She is currently on prednisone  40mg  daily. She experiences cramps, which she attributes to either the medication or  insufficient water  intake. She is also using albuterol , Advair, and steroid breathing treatments.   Since December, she has experienced a significant increase in thirst and urination, with a noted blood sugar level of 345 during a recent ER visit. She has a history of taking metformin for blood sugar management but was advised to stop temporarily. Her hemoglobin A1c in January was 5.9. No issues with blood sugars aside from the noted high level during the ER visit.   OV 08/13/22 Her cough has improved with the advair 115-21mcg 2 puffs twice day. She has been mostly using it as needed. No other complaints at this time.  Initial OV 01/20/22 PFTs in 2018 showed mild restrictive defect and significant bronchodilator response with out evidence of obstruction.   In 2017 she reports being in ICU and had trach placed and was diagnosed with myasthenia gravis. She is currently followed by [facility].   She reports intermittent cough with phlegm production that is clear in color. She does have sinus congestion and post nasal drainage. She has intermittent wheezing when the cough is bad. She is using as needed albuterol  inhaler and nebulizer treatments. She is not using these medications on a weekly basis. She also has headaches. She reports developing a rash on her arms and legs about 1 week ago. She is in a bariatric weight loss program and has lost 45lbs since last year after starting [facility] and bariatric diet.  She is not currently working and has been on disability since her critical illness in 2017. 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  She is the primary care taker for her mother-in-law. She quit smoking 10 years ago and smoked for 37 years, half a pack to 1 pack per day. She has 3 children, boy and 2 girls. Her youngest is being recruited to play basketball at [employer].  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   Past Medical History:   Diagnosis Date   Acute respiratory failure (HCC)    Acute respiratory failure with hypoxemia (HCC)    Aspiration into airway    Back pain 07/31/2017   Benign neoplasm of rectum    CAP (community acquired pneumonia) 08/05/2015   Depression with anxiety    Diabetes mellitus, new onset (HCC) 06/02/2023   Diverticulosis    Dizziness 03/13/2023   Dysphagia 03/2015   EGD, Dr Avram. mild antral gastritis, ? due to Ibuprofen .  no stricture but empirically maloney dilated esophagus.    Dysphagia, neurologic    E. 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   GERD (gastroesophageal reflux disease)    hiatal hernia   Hypertension    went away when I stopped smoking   Knee injury    Left hip pain 02/18/2017   Migraine    maybe couple times/month (03/20/2015)   Mild intermittent asthma 06/22/2017   Myasthenia gravis (HCC) 2017   Myasthenia gravis with acute exacerbation (HCC) 05/15/2015   Osteoarthritis of left knee 12/02/2013   Osteoarthritis of right knee 08/30/2013   Pancreatitis 07/25/2017   Protein-calorie malnutrition, severe (HCC) 08/07/2015   Seasonal allergies    takes Zytrec   Shortness of breath    Sinusitis 02/25/2018   Sleep apnea    with cPAp machine   Tracheostomy status (HCC)    Tubular adenoma of colon    Tumors    in my stomach   Umbilical hernia    watching , no plans for surgery at present   Urine incontinence 10/19/2015   Urticaria    VAP (ventilator-associated pneumonia) (HCC)      Family History  Problem Relation Age of Onset   Heart disease Mother 42   Hypertension Mother    Stroke Father    Hypertension Father    Atopy Sister    Allergic rhinitis Sister     Other Sister        Esophageal strictures   Allergic rhinitis Sister    Anemia Sister    Leukemia Sister    Leukemia Sister    COPD Brother    Colon cancer Neg Hx      Social History   Socioeconomic History   Marital status: Married    Spouse name: Not on file   Number of children: 3   Years of education: Not on file   Highest education level: 12th grade  Occupational History   Occupation: disabled  Tobacco Use   Smoking status: Former    Current packs/day: 0.00    Average packs/day: 1.5 packs/day for 38.2 years (57.3 ttl pk-yrs)    Types: Cigarettes    Start date: 03/03/1974    Quit date: 05/14/2012    Years since quitting: 11.4    Passive exposure: Past   Smokeless tobacco: Never   Tobacco comments:  denies thoughts of restarting  Vaping Use   Vaping status: Former  Substance and Sexual Activity   Alcohol use: No    Alcohol/week: 0.0 standard drinks of alcohol   Drug use: No   Sexual activity: Not Currently    Birth control/protection: Surgical  Other Topics Concern   Not on file  Social History Narrative   Work or School: [employer] schools - [employer] Situation: lives with husband and daughters      Spiritual Beliefs: Christian      Lifestyle: no regular exercise; trying to eat healthy      [facility] (09/11/16):   Originally from [place]. Has always lived in [name]. No pets currently. Does have carpet in her home in her bedroom. No bird exposure. Previously had mold under her kitchen sink. No indoor plants. Previously was working in [employer].          Social Drivers of Corporate investment banker Strain: Low Risk  (04/06/2023)   Overall Financial Resource Strain (CARDIA)    Difficulty of Paying Living Expenses: Not hard at all  Food Insecurity: No Food Insecurity (05/01/2023)   Hunger Vital Sign    Worried About Running Out of Food in the Last Year: Never true    Ran Out of Food in the Last Year: Never true   Transportation Needs: No Transportation Needs (05/01/2023)   PRAPARE - Administrator, Civil Service (Medical): No    Lack of Transportation (Non-Medical): No  Physical Activity: Insufficiently Active (04/06/2023)   Exercise Vital Sign    Days of Exercise per Week: 3 days    Minutes of Exercise per Session: 40 min  Stress: No Stress Concern Present (04/06/2023)   [employer] of Occupational Health - Occupational Stress Questionnaire    Feeling of Stress : Not at all  Social Connections: Moderately Integrated (05/01/2023)   Social Connection and Isolation Panel    Frequency of Communication with Friends and Family: More than three times a week    Frequency of Social Gatherings with Friends and Family: More than three times a week    Attends Religious Services: More than 4 times per year    Active Member of [employer] or Organizations: No    Attends Banker Meetings: Never    Marital Status: Married  Catering manager Violence: Not At Risk (05/01/2023)   Humiliation, Afraid, Rape, and Kick questionnaire    Fear of Current or Ex-Partner: No    Emotionally Abused: No    Physically Abused: No    Sexually Abused: No     Allergies  Allergen Reactions   Ciprofloxacin  Shortness Of Breath and Rash   Naproxen  Shortness Of Breath   Shrimp [Shellfish Allergy ] Hives and Shortness Of Breath    ER required   Dicyclomine Hives   Methocarbamol  Hives   Pineapple Itching    Itchy lips and tongue   Morphine  And Codeine  Other (See Comments)    Severe headache   Mounjaro  [Tirzepatide ] Rash     Outpatient Medications Prior to Visit  Medication Sig Dispense Refill   Accu-Chek Softclix Lancets lancets Use as instructed 100 each 12   acetaminophen  (TYLENOL ) 500 MG tablet Take 500 mg by mouth in the morning and at bedtime. (Patient taking differently: Take 500 mg by mouth as needed.)     albuterol  (PROVENTIL ) (2.5 MG/3ML) 0.083% nebulizer solution Take 3 mLs (2.5 mg total) by  nebulization every 6 (six)  hours as needed for wheezing or shortness of breath. 150 mL 1   albuterol  (VENTOLIN  HFA) 108 (90 Base) MCG/ACT inhaler INHALE 2 PUFFS INTO THE LUNGS EVERY 6 HOURS AS NEEDED FOR WHEEZE OR SHORTNESS OF BREATH (Patient taking differently: 2 puffs every 4 (four) hours as needed. INHALE 2 PUFFS INTO THE LUNGS EVERY 6 HOURS AS NEEDED FOR WHEEZE OR SHORTNESS OF BREATH) 18 g 3   APPLE CIDER VINEGAR PO Take 1 capsule by mouth daily.     Ascorbic Acid (VITAMIN C PO) Take 1,000 mg by mouth daily.     Blood Glucose Monitoring Suppl (ACCU-CHEK GUIDE ME) w/Device KIT Use as directed. 1 kit 0   cephALEXin  (KEFLEX ) 500 MG capsule Take 1 capsule (500 mg total) by mouth 3 (three) times daily. 21 capsule 0   clonazePAM  (KLONOPIN ) 0.5 MG tablet TAKE 1 TABLET BY MOUTH TWICE A DAY AS NEEDED FOR ANXIETY 60 tablet 1   Continuous Glucose Sensor (FREESTYLE LIBRE 3 PLUS SENSOR) MISC Change sensor every 15 days. 2 each 11   Cyanocobalamin  (VITAMIN B12 PO) Take 1 tablet by mouth daily.     EPINEPHrine  0.3 mg/0.3 mL IJ SOAJ injection Inject 0.3 mg into the muscle as needed for anaphylaxis. 2 each 1   esomeprazole  (NEXIUM ) 40 MG capsule Take 40 mg twice daily for 8 weeks, then decrease to daily. 60 capsule 2   famotidine  (PEPCID ) 20 MG tablet Take 1 tablet (20 mg total) by mouth 2 (two) times daily as needed for heartburn or indigestion (hives). 60 tablet 5   fexofenadine  (ALLEGRA ) 180 MG tablet Take 1 tablet (180 mg total) by mouth in the morning and at bedtime. 60 tablet 5   fluticasone  (FLONASE ) 50 MCG/ACT nasal spray Place 2 sprays into both nostrils daily. 16 g 5   glucose blood (ACCU-CHEK SMARTVIEW) test strip Use as instructed 100 each 12   halobetasol  (ULTRAVATE ) 0.05 % cream Apply topically 2 (two) times daily. 50 g 0   Lancets Misc. (ACCU-CHEK SOFTCLIX LANCET DEV) KIT Use as directed 1 kit 0   lisinopril  (ZESTRIL ) 2.5 MG tablet Take 1 tablet (2.5 mg total) by mouth at bedtime. 30 tablet 1    Menthol  10 MG LOZG Take 1 lozenge by mouth daily as needed (cough).     metFORMIN (GLUCOPHAGE-XR) 500 MG 24 hr tablet Take 1,000 mg by mouth.     montelukast  (SINGULAIR ) 10 MG tablet Take 1 tablet (10 mg total) by mouth at bedtime. 90 tablet 1   mycophenolate  (CELLCEPT ) 500 MG tablet Take 500 mg by mouth 2 (two) times daily.     Na Sulfate-K Sulfate-Mg Sulfate concentrate (SUPREP BOWEL PREP KIT) 17.5-3.13-1.6 GM/177ML SOLN Take 1 kit (354 mLs total) by mouth as directed. 324 mL 0   nystatin  (MYCOSTATIN /NYSTOP ) powder APPLY TOPICALLY 3 TIMES DAILY AS NEEDED. 15 g 1   Semaglutide ,0.25 or 0.5MG /DOS, (OZEMPIC , 0.25 OR 0.5 MG/DOSE,) 2 MG/3ML SOPN Inject 0.5 mg into the skin once a week. 3 mL 1   sertraline  (ZOLOFT ) 50 MG tablet TAKE 3 TABLETS BY MOUTH EVERY DAY 270 tablet 1   VITAMIN D , CHOLECALCIFEROL, PO Take 1 tablet by mouth daily. otc     WIXELA INHUB 250-50 MCG/ACT AEPB INHALE 1 PUFF INTO THE LUNGS IN THE MORNING AND AT BEDTIME. 60 each 5   azithromycin  (ZITHROMAX ) 250 MG tablet Take 1 tablet (250 mg total) by mouth daily. Take first 2 tablets together, then 1 every day until finished. 6 tablet 0   Facility-Administered Medications  Prior to Visit  Medication Dose Route Frequency Provider Last Rate Last Admin   omalizumab  (XOLAIR ) prefilled syringe 300 mg  300 mg Subcutaneous Q28 days Tobie Arleta SQUIBB, MD   300 mg at 10/08/23 0901   Review of Systems  Constitutional:  Positive for malaise/fatigue. Negative for chills, fever and weight loss.  HENT:  Negative for congestion, sinus pain and sore throat.   Eyes: Negative.   Respiratory:  Positive for shortness of breath (with exertion). Negative for cough, hemoptysis, sputum production and wheezing.   Cardiovascular:  Negative for chest pain, palpitations, orthopnea, claudication and leg swelling.  Gastrointestinal:  Negative for abdominal pain, heartburn, nausea and vomiting.  Genitourinary: Negative.   Musculoskeletal:  Negative for joint pain  and myalgias.  Skin:  Negative for itching and rash.  Neurological:  Negative for weakness.   Objective:   Vitals:   10/21/23 1502  BP: (!) 142/79  Pulse: 72  SpO2: 94%  Weight: (!) 309 lb 9.6 oz (140.4 kg)  Height: 5' (1.524 m)     Physical Exam Constitutional:      General: She is not in acute distress.    Appearance: She is obese. She is not ill-appearing.  HENT:     Head: Normocephalic and atraumatic.  Eyes:     General: No scleral icterus.    Conjunctiva/sclera: Conjunctivae normal.  Cardiovascular:     Rate and Rhythm: Normal rate and regular rhythm.     Pulses: Normal pulses.     Heart sounds: Normal heart sounds. No murmur heard. Pulmonary:     Effort: Pulmonary effort is normal.     Breath sounds: No wheezing, rhonchi or rales.  Musculoskeletal:     Right lower leg: No edema.     Left lower leg: No edema.  Skin:    General: Skin is warm and dry.  Neurological:     General: No focal deficit present.     Mental Status: She is alert.    CBC    Component Value Date/Time   WBC 10.2 09/11/2023 1155   RBC 5.03 09/11/2023 1155   HGB 11.1 (L) 09/11/2023 1155   HGB 11.5 05/06/2023 1041   HGB 12.4 10/30/2015 1112   HCT 36.8 09/11/2023 1155   HCT 36.9 05/06/2023 1041   HCT 38.9 10/30/2015 1112   PLT 284 09/11/2023 1155   PLT 159 05/06/2023 1041   MCV 73.2 (L) 09/11/2023 1155   MCV 71 (L) 05/06/2023 1041   MCV 72.2 (L) 10/30/2015 1112   MCH 22.1 (L) 09/11/2023 1155   MCHC 30.2 09/11/2023 1155   RDW 19.4 (H) 09/11/2023 1155   RDW 16.9 (H) 05/06/2023 1041   RDW 19.0 (H) 10/30/2015 1112   LYMPHSABS 2.0 09/11/2023 1155   LYMPHSABS 1.6 05/06/2023 1041   LYMPHSABS 1.0 10/30/2015 1112   MONOABS 0.4 09/11/2023 1155   MONOABS 0.4 10/30/2015 1112   EOSABS 0.0 09/11/2023 1155   EOSABS 0.0 05/06/2023 1041   BASOSABS 0.0 09/11/2023 1155   BASOSABS 0.0 05/06/2023 1041   BASOSABS 0.0 10/30/2015 1112      Latest Ref Rng & Units 09/11/2023   11:55 AM 06/10/2023     9:44 AM 05/25/2023    7:34 AM  BMP  Glucose 70 - 99 mg/dL 96  898    BUN 8 - 23 mg/dL 13  9    Creatinine 9.55 - 1.00 mg/dL 9.24  9.35    Sodium 864 - 145 mmol/L 140  142  137  Potassium 3.5 - 5.1 mmol/L 4.0  3.6  3.9   Chloride 98 - 111 mmol/L 103  104    CO2 22 - 32 mmol/L 26  30    Calcium 8.9 - 10.3 mg/dL 89.9  9.1     Chest imaging: CXR 05/25/23 Normal heart size and mediastinal contours. No pleural fluid, interstitial edema or airspace disease. Visualized osseous structures appear intact.  CTA Chest 05/03/20 Cardiovascular: Satisfactory pulmonary artery opacification but there is bolus dispersion, artifact from body habitus, and multiple areas of streak artifact. No visible pulmonary embolism. Normal heart size. No pericardial effusion. Negative aorta.   Mediastinum/Nodes: Negative for adenopathy or mass.   Lungs/Pleura: Band of atelectasis at the bases, subsegmental. There is no edema, consolidation, effusion, or pneumothorax.   Upper Abdomen: No acute finding.   Musculoskeletal: Spondylosis with bridging osteophytes in the lower thoracic spine.  PFT:    Latest Ref Rng & Units 10/21/2023   12:35 PM 10/27/2016    9:36 AM  PFT Results  FVC-Pre L 1.84  P 1.57   FVC-Predicted Pre % 66  P 66   FVC-Post L 1.80  P 1.66   FVC-Predicted Post % 64  P 70   Pre FEV1/FVC % % 83  P 83   Post FEV1/FCV % % 86  P 92   FEV1-Pre L 1.54  P 1.30   FEV1-Predicted Pre % 72  P 70   FEV1-Post L 1.56  P 1.52   DLCO uncorrected ml/min/mmHg 17.59  P 15.92   DLCO UNC% % 100  P 84   DLCO corrected ml/min/mmHg 18.65  P 15.64   DLCO COR %Predicted % 106  P 82   DLVA Predicted % 135  P 131   TLC L 3.70  P 3.49   TLC % Predicted % 83  P 78   RV % Predicted % 91  P 100     P Preliminary result    Labs:  Path:  Echo:  Heart Catheterization:  Sleep Study CPAP Titration 2021 Date of NPSG, Split Night or HST:   NPSG 11/19/15  AHI 10/ hr, desatturation to 86%, body weight 264 lbs     IMPRESSIONS - The optimal PAP pressure was 12 cm of water . - Central sleep apnea was not noted during this titration (CAI = 0.2/h). - Significant oxygen desaturations were not observed during this titration (min O2 = 90.0%). - No snoring was audible during this study. - No cardiac abnormalities were observed during this study. - Clinically significant periodic limb movements were not noted during this study. Arousals associated with PLMs were rare.  Assessment & Plan:   Mild intermittent asthma without complication  Discussion: Veneta Sliter is a 63 year old woman, former smoker with GERD, hypertension, OSA on CPAP, seasonal allergies, positive ANA in 2017, myasthenia gravis and asthma who returns to pulmonary clinic for cough and dyspnea.   Asthma - continue wixella 1 puff twice dialy - use albuterol  inhaler as needed  Obesity - Continue ozempic  for weight loss. - Encourage weight loss to improve respiratory function and reduce restrictive lung defect.  Idiopathic urticaria - concern for monjouro reaction, has stopped this medication  Follow up in 6 months.   Dorn Chill, MD [facility] Office: [phone]   Current Outpatient Medications:    Accu-Chek Softclix Lancets lancets, Use as instructed, Disp: 100 each, Rfl: 12   acetaminophen  (TYLENOL ) 500 MG tablet, Take 500 mg by mouth in the morning and  at bedtime. (Patient taking differently: Take 500 mg by mouth as needed.), Disp: , Rfl:    albuterol  (PROVENTIL ) (2.5 MG/3ML) 0.083% nebulizer solution, Take 3 mLs (2.5 mg total) by nebulization every 6 (six) hours as needed for wheezing or shortness of breath., Disp: 150 mL, Rfl: 1   albuterol  (VENTOLIN  HFA) 108 (90 Base) MCG/ACT inhaler, INHALE 2 PUFFS INTO THE LUNGS EVERY 6 HOURS AS NEEDED FOR WHEEZE OR SHORTNESS OF BREATH (Patient taking differently: 2 puffs every 4 (four) hours as needed. INHALE 2 PUFFS INTO THE LUNGS EVERY 6 HOURS AS NEEDED FOR WHEEZE  OR SHORTNESS OF BREATH), Disp: 18 g, Rfl: 3   APPLE CIDER VINEGAR PO, Take 1 capsule by mouth daily., Disp: , Rfl:    Ascorbic Acid (VITAMIN C PO), Take 1,000 mg by mouth daily., Disp: , Rfl:    Blood Glucose Monitoring Suppl (ACCU-CHEK GUIDE ME) w/Device KIT, Use as directed., Disp: 1 kit, Rfl: 0   cephALEXin  (KEFLEX ) 500 MG capsule, Take 1 capsule (500 mg total) by mouth 3 (three) times daily., Disp: 21 capsule, Rfl: 0   clonazePAM  (KLONOPIN ) 0.5 MG tablet, TAKE 1 TABLET BY MOUTH TWICE A DAY AS NEEDED FOR ANXIETY, Disp: 60 tablet, Rfl: 1   Continuous Glucose Sensor (FREESTYLE LIBRE 3 PLUS SENSOR) MISC, Change sensor every 15 days., Disp: 2 each, Rfl: 11   Cyanocobalamin  (VITAMIN B12 PO), Take 1 tablet by mouth daily., Disp: , Rfl:    EPINEPHrine  0.3 mg/0.3 mL IJ SOAJ injection, Inject 0.3 mg into the muscle as needed for anaphylaxis., Disp: 2 each, Rfl: 1   esomeprazole  (NEXIUM ) 40 MG capsule, Take 40 mg twice daily for 8 weeks, then decrease to daily., Disp: 60 capsule, Rfl: 2   famotidine  (PEPCID ) 20 MG tablet, Take 1 tablet (20 mg total) by mouth 2 (two) times daily as needed for heartburn or indigestion (hives)., Disp: 60 tablet, Rfl: 5   fexofenadine  (ALLEGRA ) 180 MG tablet, Take 1 tablet (180 mg total) by mouth in the morning and at bedtime., Disp: 60 tablet, Rfl: 5   fluticasone  (FLONASE ) 50 MCG/ACT nasal spray, Place 2 sprays into both nostrils daily., Disp: 16 g, Rfl: 5   glucose blood (ACCU-CHEK SMARTVIEW) test strip, Use as instructed, Disp: 100 each, Rfl: 12   halobetasol  (ULTRAVATE ) 0.05 % cream, Apply topically 2 (two) times daily., Disp: 50 g, Rfl: 0   Lancets Misc. (ACCU-CHEK SOFTCLIX LANCET DEV) KIT, Use as directed, Disp: 1 kit, Rfl: 0   lisinopril  (ZESTRIL ) 2.5 MG tablet, Take 1 tablet (2.5 mg total) by mouth at bedtime., Disp: 30 tablet, Rfl: 1   Menthol  10 MG LOZG, Take 1 lozenge by mouth daily as needed (cough)., Disp: , Rfl:    metFORMIN (GLUCOPHAGE-XR) 500 MG 24 hr tablet,  Take 1,000 mg by mouth., Disp: , Rfl:    montelukast  (SINGULAIR ) 10 MG tablet, Take 1 tablet (10 mg total) by mouth at bedtime., Disp: 90 tablet, Rfl: 1   mycophenolate  (CELLCEPT ) 500 MG tablet, Take 500 mg by mouth 2 (two) times daily., Disp: , Rfl:    Na Sulfate-K Sulfate-Mg Sulfate concentrate (SUPREP BOWEL PREP KIT) 17.5-3.13-1.6 GM/177ML SOLN, Take 1 kit (354 mLs total) by mouth as directed., Disp: 324 mL, Rfl: 0   nystatin  (MYCOSTATIN /NYSTOP ) powder, APPLY TOPICALLY 3 TIMES DAILY AS NEEDED., Disp: 15 g, Rfl: 1   Semaglutide ,0.25 or 0.5MG /DOS, (OZEMPIC , 0.25 OR 0.5 MG/DOSE,) 2 MG/3ML SOPN, Inject 0.5 mg into the skin once a week., Disp: 3 mL, Rfl: 1  sertraline  (ZOLOFT ) 50 MG tablet, TAKE 3 TABLETS BY MOUTH EVERY DAY, Disp: 270 tablet, Rfl: 1   VITAMIN D , CHOLECALCIFEROL, PO, Take 1 tablet by mouth daily. otc, Disp: , Rfl:    WIXELA INHUB 250-50 MCG/ACT AEPB, INHALE 1 PUFF INTO THE LUNGS IN THE MORNING AND AT BEDTIME., Disp: 60 each, Rfl: 5  Current Facility-Administered Medications:    omalizumab  (XOLAIR ) prefilled syringe 300 mg, 300 mg, Subcutaneous, Q28 days, Tobie Arleta SQUIBB, MD, 300 mg at 10/08/23 0901

## 2023-10-21 NOTE — Progress Notes (Signed)
 FOLLOW UP Date of Service/Encounter:  10/21/23   Subjective:  Frances Medina (DOB: 1960-03-05) is a 63 y.o. female who [facility] for follow up for idiopathic urticaria, chronic rhinitis, food reactions. Also with intermittent asthma followed by Pulm and on PPI for GERD, followed by GI.   History obtained from: chart review and patient. Last visit was on 08/20/2023 with Arlean and at the time, hives were uncontrolled so discussed starting Xolair .    Since last visit, reports hives are doing better but still breaks out daily, lasting a short time and not too bothersome.  Also thinks mounjaro  was contributing as she has stopped it and switched to Ozempic .  Not using Allegra , Pepcid  or Singulair .   Still with frequent congestion and drainage. Uses Flonase  daily but no anti histamine.  Not interested in food reintroduction of avocado, pineapple, shellfish. Keep an Epipen , no accidental exposure.   Planning to see pulm today for asthma, reports doing okay on Wixela.  Does note some coughing.   Past Medical History: Past Medical History:  Diagnosis Date   Acute respiratory failure (HCC)    Acute respiratory failure with hypoxemia (HCC)    Aspiration into airway    Back pain 07/31/2017   Benign neoplasm of rectum    CAP (community acquired pneumonia) 08/05/2015   Depression with anxiety    Diabetes mellitus, new onset (HCC) 06/02/2023   Diverticulosis    Dizziness 03/13/2023   Dysphagia 03/2015   EGD, Dr Avram. mild antral gastritis, ? due to Ibuprofen .  no stricture but empirically maloney dilated esophagus.    Dysphagia, neurologic    E. 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   Gastrostomy  infection (HCC) 11/08/2015   GERD (gastroesophageal reflux disease)    hiatal hernia   Hypertension    went away when I stopped smoking   Knee injury    Left hip pain 02/18/2017   Migraine    maybe couple times/month (03/20/2015)   Mild intermittent asthma 06/22/2017   Myasthenia gravis (HCC) 2017   Myasthenia gravis with acute exacerbation (HCC) 05/15/2015   Osteoarthritis of left knee 12/02/2013   Osteoarthritis of right knee 08/30/2013   Pancreatitis 07/25/2017   Protein-calorie malnutrition, severe (HCC) 08/07/2015   Seasonal allergies    takes Zytrec   Shortness of breath    Sinusitis 02/25/2018   Sleep apnea    with cPAp machine   Tracheostomy status (HCC)    Tubular adenoma of colon    Tumors    in my stomach   Umbilical hernia    watching , no plans for surgery at present   Urine incontinence 10/19/2015   Urticaria    VAP (ventilator-associated pneumonia) (HCC)     Objective:  BP 134/86 (BP Location: Right Wrist, Patient Position: Sitting, Cuff Size: Normal)   Pulse 86   Temp 98.1 F (36.7 C) (Temporal)   SpO2 96%  There is no height or weight on file to calculate BMI. Physical Exam: GEN: alert, well developed HEENT: clear conjunctiva, nose with mild inferior turbinate hypertrophy, pink nasal mucosa, clear rhinorrhea, + cobblestoning HEART: regular rate and rhythm, no murmur LUNGS: clear to auscultation bilaterally, no coughing, unlabored respiration  SKIN: no rashes or lesions   Spirometry:  Tracings reviewed. Her effort: It was hard to get consistent efforts and there is a question as to whether this reflects a maximal maneuver. FVC: 1.32L, 55% predicted  FEV1: 1.12L, 59% predicted FEV1/FVC ratio: 85% Interpretation: Spirometry consistent with possible restrictive disease.  Please see scanned spirometry results for details.   Assessment:   1. Idiopathic urticaria   2. Chronic rhinitis   3. Adverse food reaction, subsequent encounter   4.  Mild intermittent asthma without complication     Plan/Recommendations:   Idiopathic Urticaria (Hives): - Improved but not well controlled. Restart anti histamines and Singulair .  - At this time etiology of hives and swelling is unknown. Hives can be caused by a variety of different triggers including illness/infection, exercise, pressure, vibrations, extremes of temperature to name a few however majority of the time there is no identifiable trigger.  - 05/2023: positive CU index, tryptase 13.2, elevated TPO Ab with normal TSH, elevated ESR with normal ANA, negative alpha gal.  06/2023 Repeat tryptase 11.7 - Continue Xolair  300mg  every 4 weeks. Keep Epipen .  - Restart Allegra  180mg  twice daily. If still having hives, add Pepcid  20mg  twice daily.  - Restart Singulair  10 mg daily.    Chronic Rhinitis: - Uncontrolled, add OAH.  - Positive skin test 03/2022: none - Use nasal saline rinses before nose sprays such as with Neilmed Sinus Rinse.  Use distilled water .   - Use Flonase  2 sprays each nostril daily. Aim upward and outward. - Use Allegra  (fexofenadine )180 mg twice daily.    Food Reactions:  - Okay to avoid avocado/pineapple/shellfish.  If interested in reintroduction, let us  know.  - SPT 03/2022 was negative to shellfish/pineapple/avocado.  sIgE 03/2023: negative to shellfish/pineapple/avocado - Initial rxn: shellfish with hives/SOB; pineapple and avocado with tongue itching  - for SKIN only reaction, okay to take Benadryl  25mg  capsules every 6 hours - for SKIN + ANY additional symptoms, OR IF concern for LIFE THREATENING reaction = Epipen  Autoinjector EpiPen  0.3 mg. - If using Epinephrine  autoinjector, call 911 or go to the ER.    GERD: - Follow up with GI. On Omeprazole  20mg  daily.   -Avoid lying down for at least two hours after a meal or after drinking acidic beverages, like soda, or other caffeinated beverages. This can help to prevent stomach contents from flowing back into the  esophagus. -Keep your head elevated while you sleep. Using an extra pillow or two can also help to prevent reflux. -Eat smaller and more frequent meals each day instead of a few large meals. This promotes digestion and can aid in preventing heartburn. -Wear loose-fitting clothes to ease pressure on the stomach, which can worsen heartburn and reflux. -Reduce excess weight around the midsection. This can ease pressure on the stomach. Such pressure can force some stomach contents back up the esophagus.    Mild Intermittent Asthma  - Follow up with Pulm.  On Wixela and Singulair . Spirometry today with possible restriction.     Return in about 6 months (around 04/22/2024).  Arleta Blanch, MD [facility] 

## 2023-10-21 NOTE — Patient Instructions (Addendum)
 Idiopathic Urticaria (Hives): - At this time etiology of hives and swelling is unknown. Hives can be caused by a variety of different triggers including illness/infection, exercise, pressure, vibrations, extremes of temperature to name a few however majority of the time there is no identifiable trigger.  - Continue Xolair  300mg  every 4 weeks. Keep Epipen .  - Restart Allegra  180mg  twice daily. If still having hives, add Pepcid  20mg  twice daily.  - Restart Singulair  10 mg daily.    Chronic Rhinitis: - Positive skin test 03/2022: none - Use nasal saline rinses before nose sprays such as with Neilmed Sinus Rinse.  Use distilled water .   - Use Flonase  2 sprays each nostril daily. Aim upward and outward. - Use Allegra  (fexofenadine )180 mg twice daily.    Food Reactions:  - Okay to avoid avocado/pineapple/shellfish.  If interested in reintroduction, let us  know.  - for SKIN only reaction, okay to take Benadryl  25mg  capsules every 6 hours - for SKIN + ANY additional symptoms, OR IF concern for LIFE THREATENING reaction = Epipen  Autoinjector EpiPen  0.3 mg. - If using Epinephrine  autoinjector, call 911 or go to the ER.    GERD: - Follow up with GI. On Omeprazole  20mg  daily.   -Avoid lying down for at least two hours after a meal or after drinking acidic beverages, like soda, or other caffeinated beverages. This can help to prevent stomach contents from flowing back into the esophagus. -Keep your head elevated while you sleep. Using an extra pillow or two can also help to prevent reflux. -Eat smaller and more frequent meals each day instead of a few large meals. This promotes digestion and can aid in preventing heartburn. -Wear loose-fitting clothes to ease pressure on the stomach, which can worsen heartburn and reflux. -Reduce excess weight around the midsection. This can ease pressure on the stomach. Such pressure can force some stomach contents back up the esophagus.    Mild Intermittent Asthma  -  Follow up with Pulm.  On Wixela and Singulair .

## 2023-10-22 ENCOUNTER — Encounter: Payer: Self-pay | Admitting: Family Medicine

## 2023-10-27 ENCOUNTER — Telehealth: Payer: Self-pay | Admitting: Internal Medicine

## 2023-10-27 ENCOUNTER — Other Ambulatory Visit: Payer: Self-pay | Admitting: Family Medicine

## 2023-10-27 NOTE — Telephone Encounter (Signed)
 Patient states she has walking around in her yard Saturday morning and now has hives on her legs and arms. The bumps are very itchy and aggravating. She is taking Allegra  and Pepcid  but doesn't seem to help. She doesn't believe the Xolair  is benefiting her.

## 2023-10-28 ENCOUNTER — Encounter: Payer: Self-pay | Admitting: Family Medicine

## 2023-10-28 DIAGNOSIS — N390 Urinary tract infection, site not specified: Secondary | ICD-10-CM

## 2023-10-29 NOTE — Telephone Encounter (Signed)
 Spoke with patient. Made virtual visit for Sept. 8th at 8:50. Patient states she is still having a issues with hives. Nelson Land, CMA

## 2023-10-30 NOTE — Telephone Encounter (Signed)
 I called and had to leave a message to call us  back since Dr. Tobie responded.

## 2023-11-05 ENCOUNTER — Encounter: Payer: Self-pay | Admitting: Internal Medicine

## 2023-11-05 NOTE — Telephone Encounter (Signed)
 I called and left a voicemail with letting her know Dr. Tobie got back with us  and that a mychart message has been sent.

## 2023-11-05 NOTE — Telephone Encounter (Signed)
 Pt informed. She will let us  know if max dosing does not help.

## 2023-11-06 ENCOUNTER — Encounter: Payer: Self-pay | Admitting: Internal Medicine

## 2023-11-06 ENCOUNTER — Ambulatory Visit

## 2023-11-06 ENCOUNTER — Other Ambulatory Visit: Payer: Self-pay | Admitting: Internal Medicine

## 2023-11-06 DIAGNOSIS — L501 Idiopathic urticaria: Secondary | ICD-10-CM | POA: Diagnosis not present

## 2023-11-06 MED ORDER — HYDROXYZINE HCL 10 MG PO TABS: 10.0000 mg | ORAL_TABLET | Freq: Three times a day (TID) | ORAL | 1 refills | Status: DC | PRN

## 2023-11-06 NOTE — Progress Notes (Signed)
 Hydroxyzine  sent for uncontrolled hives.  Made aware of side effects of drowsiness.

## 2023-11-06 NOTE — Telephone Encounter (Signed)
 Changed appt to a in person instead. Patient was notified!

## 2023-11-06 NOTE — Addendum Note (Signed)
 Addended by: ANDERS CUMINS T on: 11/06/2023 12:07 PM   Modules accepted: Orders

## 2023-11-06 NOTE — Telephone Encounter (Signed)
 Patient has a Mychart visit on 09/08 @850  am

## 2023-11-09 ENCOUNTER — Ambulatory Visit: Payer: Self-pay | Admitting: Family Medicine

## 2023-11-09 ENCOUNTER — Telehealth: Admitting: Family Medicine

## 2023-11-09 ENCOUNTER — Ambulatory Visit (INDEPENDENT_AMBULATORY_CARE_PROVIDER_SITE_OTHER): Admitting: Family Medicine

## 2023-11-09 ENCOUNTER — Encounter: Payer: Self-pay | Admitting: Family Medicine

## 2023-11-09 VITALS — BP 129/82 | HR 84 | Ht 60.0 in | Wt 308.4 lb

## 2023-11-09 DIAGNOSIS — E1169 Type 2 diabetes mellitus with other specified complication: Secondary | ICD-10-CM

## 2023-11-09 DIAGNOSIS — R399 Unspecified symptoms and signs involving the genitourinary system: Secondary | ICD-10-CM | POA: Diagnosis not present

## 2023-11-09 DIAGNOSIS — L501 Idiopathic urticaria: Secondary | ICD-10-CM | POA: Diagnosis present

## 2023-11-09 LAB — POCT URINALYSIS DIP (MANUAL ENTRY)
Bilirubin, UA: NEGATIVE
Blood, UA: NEGATIVE
Glucose, UA: NEGATIVE mg/dL
Ketones, POC UA: NEGATIVE mg/dL
Leukocytes, UA: NEGATIVE
Nitrite, UA: NEGATIVE
Protein Ur, POC: NEGATIVE mg/dL
Spec Grav, UA: 1.01 (ref 1.010–1.025)
Urobilinogen, UA: 0.2 U/dL
pH, UA: 5.5 (ref 5.0–8.0)

## 2023-11-09 MED ORDER — PREDNISONE 20 MG PO TABS: 40.0000 mg | ORAL_TABLET | Freq: Every day | ORAL | 0 refills | Status: AC

## 2023-11-09 NOTE — Assessment & Plan Note (Signed)
 Type 2 diabetes managed with metformin and Ozempic . Prednisone  may increase glucose levels. - Monitor glucose levels during prednisone  treatment. - Consider increasing Ozempic  to 1 mg weekly after current dose period.

## 2023-11-09 NOTE — Progress Notes (Signed)
 SUBJECTIVE:   CHIEF COMPLAINT / HPI:   Discussed the use of AI scribe software for clinical note transcription with the patient, who gave verbal consent to proceed.  History of Present Illness   Frances Medina is a 63 year old female who presents with a persistent rash and itching.  She has been experiencing a persistent rash and itching for nearly a year. The rash is characterized by small, burning, and itchy bumps that provide relief upon scratching. It is located on her legs, arms, and back. She experiences chills and a sensation of heat when the rash flares up. She has tried various medications, including Xolair  injections, without relief.  Approximately two months ago, she had a urinary tract infection and was prescribed Keflex , which she discontinued after experiencing hives. She is unsure if she is still experiencing urinary symptoms but is concerned about a possible infection in her body.  Her current medications include albuterol  as needed, Xyzal for allergies, Klonopin  as needed, Vitamin B12, Nexium  40 mg, Pepcid  20 mg twice daily, Allegra  180 mg twice daily, hydroxyzine  as needed for itching, lisinopril  2.5 mg, metformin 1000 mg daily, Singulair  10 mg, Cellcept  500 mg twice daily, nystatin  cream as needed, Ozempic  0.5 mg weekly, Zoloft  150 mg daily, Vitamin D , and Wixela inhaler. She recently started Ozempic  and reports no correlation between its use and the rash.  She has a history of negative ANA and rheumatoid arthritis factor tests, with a previously positive ANA in 2017. She has undergone allergy  testing, which did not identify any specific allergens. She is cautious about vaccinations until the cause of her rash is determined.       PERTINENT  PMH / PSH: PMHx reviewed  OBJECTIVE:   BP 129/82   Pulse 84   Ht 5' (1.524 m)   Wt (!) 308 lb 6 oz (139.9 kg)   SpO2 99%   BMI 60.23 kg/m   Physical Exam   CHEST: Air entry equal B/L. No wheezing. Heart: S1 S2 normal, no  murmurs. RRR SKIN: Urticarial-like rash on legs. Erythematous macular papular lesions. Scattered lesions all over, including back, back of legs, and arms.     Results   LABS ANA: Negative (2025) Rheumatoid factor: Negative (2025) Acetylcholine receptor antibody: Negative (2025)  PATHOLOGY Skin biopsy: Simple urticaria, no fungal infection (04/2023)       ASSESSMENT/PLAN:   Assessment & Plan   Assessment and Plan    Chronic urticaria with generalized pruritus Chronic urticaria with pruritus for over six months, unresponsive to Xolair . Negative ANA and rheumatoid factor. No specific allergens or medications identified as cause. - Prescribed prednisone  40 mg daily for 5 days despite potential weight gain and increased glucose levels due to diabetes. - Continue follow-up with allergist. - Provided information about hives. - Review medical record for any missing information.  History of incomplete treatment for urinary tract infection Incomplete UTI treatment due to allergic reaction to Keflex . Unclear if current symptoms are related to UTI. - Order urinalysis and urine culture. - Discuss treatment necessity based on urinalysis and culture results. - UA negative for infection and message sent via MyChart  Type 2 diabetes mellitus Type 2 diabetes managed with metformin and Ozempic . Prednisone  may increase glucose levels. - Monitor glucose levels during prednisone  treatment. - Consider increasing Ozempic  to 1 mg weekly after current dose period.  Hypertension Hypertension managed with lisinopril  2.5 mg daily.  General Health Maintenance Prefers to delay vaccinations until urticaria is resolved.  Follow-Up  Regular follow-up with allergist for Xolair  injections and potential adjustment to bi-weekly schedule. - Follow-up with allergist for Xolair  injections.         Otto Fairly, MD [facility]

## 2023-11-09 NOTE — Patient Instructions (Signed)
 Hives Hives are itchy, red, swollen areas on your skin. They can show up on any part of your body. They often go away within 24 hours (acute hives). If you get new hives after the old ones fade and this goes on for many days or weeks, it is called chronic hives. Hives do not spread from person to person (are not contagious). Hives can happen when your body reacts to something that you are allergic to (allergen). These are sometimes called triggers. You can get hives right after being around a trigger, or hours later. What are the causes? Food allergies. Insect bites or stings. Allergies to pollen or pets. Spending time in sunlight, heat, or cold. Exercise. Stress. Other causes, such as: Viruses. This includes the common cold. Infections caused by germs (bacteria). Some medicines. Chemicals or latex. Allergy shots. Blood transfusions. In some cases, the cause is not known. What increases the risk? Being female. Being allergic to foods, such as: Citrus fruits. Milk. Eggs. Peanuts. Tree nuts. Shellfish. Being allergic to: Medicines. Latex. Insects. Animals. Pollen. What are the signs or symptoms?  Itchy, red or white bumps or spots on your skin. These areas may: Swell and get bigger. Change in shape and location. Stand alone or connect to each other over a large area of skin. Sting or hurt. Turn white when pressed in the center (blanch). In very bad cases, your hands, feet, and face may also swell. This may happen if hives start deeper in your skin. How is this treated? Treatment for hives depends on your symptoms. You may need to: Use cool, wet cloths (cool compresses) or take cool showers to stop the itching. Take or apply medicines to: Help with itching (antihistamines). Lessen swelling (corticosteroids). Treat infection (antibiotics). Have a medicine called omalizumab given to you as a shot. You may need this if your hives do not get better with other treatments. In  very bad cases, you may need to use a device filled with medicine that gives an emergency shot of epinephrine (auto-injector pen) to stop a very bad allergic reaction (anaphylactic reaction). Follow these instructions at home: Medicines Take or apply over-the-counter and prescription medicines only as told by your doctor. If you were prescribed antibiotics, use them as told by your doctor. Do not stop using them even if you start to feel better. Skin care Put cool, wet cloths on the hives. Do not scratch your skin. Do not rub your skin. General instructions Do not take hot showers or baths. This can make itching worse. Do not wear tight clothes. Use sunscreen. Wear clothes that cover your skin when you are outside. Avoid triggers that cause your hives. Keep a journal to help track what causes your hives. Write down: What medicines you take. What you eat and drink. What you put on your skin. Keep all follow-up visits. Your doctor will need to make sure treatment is working. Contact a doctor if: Your symptoms do not get better with medicine. Your joints hurt or swell. You have a fever. You have pain in your belly (abdomen). Get help right away if: Your tongue or lips swell. Your eyelids are swollen. Your chest or throat feels tight. You have trouble breathing or swallowing. These symptoms may be an emergency. Get help right away. Call 911. Do not wait to see if the symptoms will go away. Do not drive yourself to the hospital. This information is not intended to replace advice given to you by your health care provider. Make sure  you discuss any questions you have with your health care provider. Document Revised: 11/05/2021 Document Reviewed: 11/05/2021 Elsevier Patient Education  2024 [employer].

## 2023-11-09 NOTE — Assessment & Plan Note (Signed)
 Chronic urticaria with generalized pruritus Chronic urticaria with pruritus for over six months, unresponsive to Xolair . Negative ANA and rheumatoid factor. No specific allergens or medications identified as cause. - Prescribed prednisone  40 mg daily for 5 days despite potential weight gain and increased glucose levels due to diabetes. - Continue follow-up with allergist. - Provided information about hives. - Review medical record for any missing information.

## 2023-11-10 ENCOUNTER — Telehealth: Payer: Self-pay | Admitting: *Deleted

## 2023-11-10 MED ORDER — XOLAIR 300 MG/2ML ~~LOC~~ SOSY: 300.0000 mg | PREFILLED_SYRINGE | SUBCUTANEOUS | 11 refills | Status: DC

## 2023-11-10 NOTE — Telephone Encounter (Signed)
 L/m for patient to contact me, will send in dosing increase to [employer] and will be able to increase with next shipment

## 2023-11-10 NOTE — Telephone Encounter (Signed)
-----   Message from Arleta SHAUNNA Blanch sent at 11/06/2023 12:48 PM EDT ----- Frances Medina  Can we work on increasing her dose to Xolair  300mg  every 2 weeks please? Still having frequent flare ups despite monthly Xolair   Thanks Blanch

## 2023-11-10 NOTE — Telephone Encounter (Signed)
 Patient advised of dosing increase

## 2023-11-14 ENCOUNTER — Other Ambulatory Visit: Payer: Self-pay | Admitting: Family Medicine

## 2023-11-22 ENCOUNTER — Other Ambulatory Visit: Payer: Self-pay | Admitting: Family Medicine

## 2023-11-23 ENCOUNTER — Encounter: Payer: Self-pay | Admitting: Family Medicine

## 2023-11-23 ENCOUNTER — Telehealth: Payer: Self-pay

## 2023-11-23 ENCOUNTER — Other Ambulatory Visit: Payer: Self-pay | Admitting: Family Medicine

## 2023-11-23 ENCOUNTER — Other Ambulatory Visit (HOSPITAL_COMMUNITY): Payer: Self-pay

## 2023-11-23 DIAGNOSIS — K8020 Calculus of gallbladder without cholecystitis without obstruction: Secondary | ICD-10-CM

## 2023-11-23 DIAGNOSIS — R7989 Other specified abnormal findings of blood chemistry: Secondary | ICD-10-CM

## 2023-11-23 DIAGNOSIS — K760 Fatty (change of) liver, not elsewhere classified: Secondary | ICD-10-CM

## 2023-11-23 MED ORDER — SEMAGLUTIDE (1 MG/DOSE) 4 MG/3ML ~~LOC~~ SOPN: 1.0000 mg | PEN_INJECTOR | SUBCUTANEOUS | 1 refills | Status: DC

## 2023-11-23 MED ORDER — SEMAGLUTIDE (1 MG/DOSE) 4 MG/3ML ~~LOC~~ SOPN
1.0000 mg | PEN_INJECTOR | SUBCUTANEOUS | 1 refills | Status: DC
  Filled 2023-11-23 – 2023-12-08 (×2): qty 3, 28d supply, fill #0

## 2023-11-23 NOTE — Addendum Note (Signed)
 Addended by: ANDERS OTTO DASEN on: 11/23/2023 08:41 AM   Modules accepted: Orders

## 2023-11-23 NOTE — Telephone Encounter (Signed)
-----   Message from Nurse Mattawana B sent at 06/22/2023  1:29 PM EDT ----- Regarding: OV and labs Needs an appt mid-October with Alan Coombs, PA Needs CBC and LFTs

## 2023-11-23 NOTE — Telephone Encounter (Signed)
 Due for RUQ US  as well.  - Lab orders in epic.  - RUQ US  order in epic. Secure staff message sent to radiology to contact patient to schedule appt.  - MyChart message to patient with information.

## 2023-11-27 ENCOUNTER — Ambulatory Visit (HOSPITAL_COMMUNITY)
Admission: RE | Admit: 2023-11-27 | Discharge: 2023-11-27 | Disposition: A | Discharge status: Home / Self Care | Source: Ambulatory Visit | Attending: Physician Assistant | Admitting: Physician Assistant

## 2023-11-27 DIAGNOSIS — R7989 Other specified abnormal findings of blood chemistry: Secondary | ICD-10-CM | POA: Insufficient documentation

## 2023-11-27 DIAGNOSIS — K760 Fatty (change of) liver, not elsewhere classified: Secondary | ICD-10-CM | POA: Diagnosis present

## 2023-11-27 DIAGNOSIS — K8020 Calculus of gallbladder without cholecystitis without obstruction: Secondary | ICD-10-CM | POA: Insufficient documentation

## 2023-11-30 ENCOUNTER — Ambulatory Visit: Payer: Self-pay | Admitting: Physician Assistant

## 2023-11-30 DIAGNOSIS — D509 Iron deficiency anemia, unspecified: Secondary | ICD-10-CM

## 2023-12-01 ENCOUNTER — Other Ambulatory Visit: Payer: Self-pay | Admitting: Internal Medicine

## 2023-12-04 ENCOUNTER — Ambulatory Visit

## 2023-12-04 DIAGNOSIS — L501 Idiopathic urticaria: Secondary | ICD-10-CM

## 2023-12-08 ENCOUNTER — Other Ambulatory Visit (HOSPITAL_COMMUNITY): Payer: Self-pay

## 2023-12-08 MED ORDER — METFORMIN HCL ER 500 MG PO TB24: 1000.0000 mg | ORAL_TABLET | Freq: Every day | ORAL | 5 refills | Status: DC

## 2023-12-09 ENCOUNTER — Other Ambulatory Visit: Payer: Self-pay

## 2023-12-09 ENCOUNTER — Other Ambulatory Visit (HOSPITAL_COMMUNITY): Payer: Self-pay

## 2023-12-09 MED FILL — Lisinopril Tab 2.5 MG: ORAL | 90 days supply | Qty: 90 | Fill #0 | Status: CN

## 2023-12-09 MED FILL — Sertraline HCl Tab 50 MG: ORAL | 90 days supply | Qty: 270 | Fill #0 | Status: CN

## 2023-12-10 ENCOUNTER — Encounter: Payer: Self-pay | Admitting: Family Medicine

## 2023-12-10 NOTE — Telephone Encounter (Signed)
 Can you please have this patient come into the clinic for an appointment? Thank you

## 2023-12-11 NOTE — Telephone Encounter (Signed)
 I called the patient to schedule appt. I left a message to call the office back. Mychart message sent.

## 2023-12-17 ENCOUNTER — Other Ambulatory Visit: Payer: Self-pay | Admitting: Internal Medicine

## 2023-12-17 ENCOUNTER — Encounter: Payer: Self-pay | Admitting: Family Medicine

## 2023-12-18 ENCOUNTER — Other Ambulatory Visit (HOSPITAL_COMMUNITY): Payer: Self-pay

## 2023-12-18 MED ORDER — ALBUTEROL SULFATE HFA 108 (90 BASE) MCG/ACT IN AERS
2.0000 | INHALATION_SPRAY | Freq: Four times a day (QID) | RESPIRATORY_TRACT | 3 refills | Status: AC | PRN
  Filled 2023-12-18: qty 20.1, 25d supply, fill #0
  Filled 2023-12-19: qty 6.7, 25d supply, fill #0
  Filled 2024-03-22: qty 6.7, 25d supply, fill #1

## 2023-12-19 ENCOUNTER — Other Ambulatory Visit (HOSPITAL_COMMUNITY): Payer: Self-pay

## 2023-12-22 ENCOUNTER — Encounter: Payer: Self-pay | Admitting: Internal Medicine

## 2023-12-22 ENCOUNTER — Other Ambulatory Visit (HOSPITAL_COMMUNITY): Payer: Self-pay

## 2023-12-22 ENCOUNTER — Ambulatory Visit: Admitting: Internal Medicine

## 2023-12-22 VITALS — BP 142/86 | HR 78 | Temp 97.9°F

## 2023-12-22 DIAGNOSIS — L501 Idiopathic urticaria: Secondary | ICD-10-CM

## 2023-12-22 DIAGNOSIS — J310 Chronic rhinitis: Secondary | ICD-10-CM | POA: Diagnosis not present

## 2023-12-22 MED ORDER — FAMOTIDINE 40 MG PO TABS
40.0000 mg | ORAL_TABLET | Freq: Two times a day (BID) | ORAL | 5 refills | Status: DC
  Filled 2023-12-22: qty 60, 30d supply, fill #0

## 2023-12-22 NOTE — Patient Instructions (Addendum)
 Idiopathic Urticaria (Hives): - At this time etiology of hives and swelling is unknown. Hives can be caused by a variety of different triggers including illness/infection, exercise, pressure, vibrations, extremes of temperature to name a few however majority of the time there is no identifiable trigger.  - Stop Xolair , start Rhapsido 25mg  twice daily. Will message Tammy.  - Use Zyrtec  20mg  twice daily and Pepcid  40mg  twice daily.  - Use Singulair  10 mg daily.    Chronic Rhinitis: - Positive skin test 03/2022: none - Use nasal saline rinses before nose sprays such as with Neilmed Sinus Rinse.  Use distilled water .   - Use Flonase  2 sprays each nostril daily. Aim upward and outward. - Use Zyrtec  10mg .  Dosing dependent on hives.    Food Reactions:  - Okay to avoid avocado/pineapple/shellfish.  If interested in reintroduction, let us  know.  - for SKIN only reaction, okay to take Benadryl  25mg  capsules every 6 hours - for SKIN + ANY additional symptoms, OR IF concern for LIFE THREATENING reaction = Epipen  Autoinjector EpiPen  0.3 mg. - If using Epinephrine  autoinjector, call 911 or go to the ER.    GERD: - Follow up with GI. On Omeprazole  20mg  daily.   -Avoid lying down for at least two hours after a meal or after drinking acidic beverages, like soda, or other caffeinated beverages. This can help to prevent stomach contents from flowing back into the esophagus. -Keep your head elevated while you sleep. Using an extra pillow or two can also help to prevent reflux. -Eat smaller and more frequent meals each day instead of a few large meals. This promotes digestion and can aid in preventing heartburn. -Wear loose-fitting clothes to ease pressure on the stomach, which can worsen heartburn and reflux. -Reduce excess weight around the midsection. This can ease pressure on the stomach. Such pressure can force some stomach contents back up the esophagus.    Mild Intermittent Asthma  - Follow up with  Pulm.  On Wixela and Singulair .

## 2023-12-22 NOTE — Progress Notes (Signed)
 FOLLOW UP Date of Service/Encounter:  12/22/23   Subjective:  Frances Medina (DOB: [birth year]) is a 63 y.o. female who [facility] for follow up for an acute visit.   History obtained from: chart review and patient. Last seen by me on 10/21/2203 with some improvement of hives but not well controlled.  Discussed restarting anti histamines and Singulair , in addition to Xolair .    Reports hives are flaring up daily.  She is itching all the time and can barely rest.  Has been taking combination of Zyrtec , Xyzal and Pepcid  with minimal relief; tried Allegra  in the past.  Also on Singulair .  Taking Xolair  every 4 weeks but not really sure if this has helped at all.    Also notes some congestion and drainage but has been improving with use of Flonase /anti histamine.   Past Medical History: Past Medical History:  Diagnosis Date   Acute respiratory failure (HCC)    Acute respiratory failure with hypoxemia (HCC)    Aspiration into airway    Back pain 07/31/2017   Benign neoplasm of rectum    CAP (community acquired pneumonia) 08/05/2015   Depression with anxiety    Diabetes mellitus, new onset (HCC) 06/02/2023   Diverticulosis    Dizziness 03/13/2023   Dysphagia 03/2015   EGD, Dr Avram. mild antral gastritis, ? due to Ibuprofen .  no stricture but empirically maloney dilated esophagus.    Dysphagia, neurologic    E. 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   GERD (gastroesophageal reflux disease)    hiatal hernia   Hypertension    went away when I stopped smoking   Knee injury    Left hip pain 02/18/2017   Migraine    maybe couple times/month  (03/20/2015)   Mild intermittent asthma 06/22/2017   Myasthenia gravis (HCC) 2017   Myasthenia gravis with acute exacerbation (HCC) 05/15/2015   Osteoarthritis of left knee 12/02/2013   Osteoarthritis of right knee 08/30/2013   Pancreatitis 07/25/2017   Protein-calorie malnutrition, severe 08/07/2015   Seasonal allergies    takes Zytrec   Shortness of breath    Sinusitis 02/25/2018   Sleep apnea    with cPAp machine   Tracheostomy status (HCC)    Tubular adenoma of colon    Tumors    in my stomach   Umbilical hernia    watching , no plans for surgery at present   Urine incontinence 10/19/2015   Urticaria    VAP (ventilator-associated pneumonia) (HCC)     Objective:  BP (!) 142/86 (BP Location: Left Wrist, Patient Position: Sitting, Cuff Size: Normal)   Pulse 78   Temp 97.9 F (36.6 C) (Temporal)   SpO2 97%  There is no height or weight on file to calculate BMI. Physical Exam: GEN: alert, well developed HEENT: clear conjunctiva, nose with mild inferior turbinate hypertrophy, pink nasal mucosa, + clear rhinorrhea, + cobblestoning HEART: regular rate and rhythm, no murmur LUNGS: clear to auscultation bilaterally, no coughing, unlabored respiration SKIN: hives on posterior R thigh   Assessment:   1. Idiopathic urticaria   2. Chronic rhinitis     Plan/Recommendations:   Chronic Urticaria- Autoimmune -  Uncontrolled despite 4+ months of Xolair  with high dose anti histamines + Singulair  and still breaking out daily.  Discussed new treatment for CSU, Rhapsido.  Normal liver function 09/2203 on CMP, not on any anti coagulants. Side effects of headaches, nasopharyngitis and bleeding risk discussed.  - Hives can be caused by a variety of different triggers including illness/infection, exercise, pressure, vibrations, extremes of temperature to name a few however majority of the time there is no identifiable trigger.  - 05/2023: positive CU index, tryptase 13.2, elevated TPO Ab  with normal TSH, elevated ESR with normal ANA, negative alpha gal. 06/2023 Repeat tryptase 11.7  - skin biopsy 04/2023 showing superficial infiltrate of eosinophils and lymphocytes with edema, correlates with early urticaria  - Given option for HAT testing but wishes to hold off as that would not change management at this time.   - Stop Xolair , start Rhapsido 25mg  twice daily. Will message Tammy.  - Use Zyrtec  20mg  twice daily and Pepcid  40mg  twice daily.  - Use Singulair  10 mg daily.    Chronic Rhinitis: - Positive skin test 03/2022: none - Use nasal saline rinses before nose sprays such as with Neilmed Sinus Rinse.  Use distilled water .   - Use Flonase  2 sprays each nostril daily. Aim upward and outward. - Use Zyrtec  10mg .  Dosing dependent on hives.    Food Reactions:  - Okay to avoid avocado/pineapple/shellfish.  If interested in reintroduction, let us  know.  - for SKIN only reaction, okay to take Benadryl  25mg  capsules every 6 hours - for SKIN + ANY additional symptoms, OR IF concern for LIFE THREATENING reaction = Epipen  Autoinjector EpiPen  0.3 mg. - If using Epinephrine  autoinjector, call 911 or go to the ER.    GERD: - Follow up with GI. On Omeprazole  20mg  daily.   -Avoid lying down for at least two hours after a meal or after drinking acidic beverages, like soda, or other caffeinated beverages. This can help to prevent stomach contents from flowing back into the esophagus. -Keep your head elevated while you sleep. Using an extra pillow or two can also help to prevent reflux. -Eat smaller and more frequent meals each day instead of a few large meals. This promotes digestion and can aid in preventing heartburn. -Wear loose-fitting clothes to ease pressure on the stomach, which can worsen heartburn and reflux. -Reduce excess weight around the midsection. This can ease pressure on the stomach. Such pressure can force some stomach contents back up the esophagus.    Mild Intermittent  Asthma  - Follow up with Pulm.  On Wixela and Singulair .    Keep follow up scheduled.  Arleta Blanch, MD [facility] 

## 2023-12-24 ENCOUNTER — Ambulatory Visit (INDEPENDENT_AMBULATORY_CARE_PROVIDER_SITE_OTHER): Admitting: Family Medicine

## 2023-12-24 ENCOUNTER — Other Ambulatory Visit (HOSPITAL_COMMUNITY): Payer: Self-pay

## 2023-12-24 VITALS — BP 139/88 | HR 80 | Temp 97.6°F | Ht 60.0 in | Wt 308.2 lb

## 2023-12-24 DIAGNOSIS — J0110 Acute frontal sinusitis, unspecified: Secondary | ICD-10-CM | POA: Diagnosis present

## 2023-12-24 MED ORDER — AMOXICILLIN 875 MG PO TABS
875.0000 mg | ORAL_TABLET | Freq: Two times a day (BID) | ORAL | 0 refills | Status: AC
  Filled 2023-12-24: qty 10, 5d supply, fill #0

## 2023-12-24 NOTE — Patient Instructions (Signed)
 Good to see you today - Thank you for coming in  Things we discussed today:  Take Amoxicillin  twice a day for 5 days. Let us  know if your symptoms do not get better or worsen.  Take care!

## 2023-12-24 NOTE — Progress Notes (Signed)
    SUBJECTIVE:   CHIEF COMPLAINT / HPI:   Frontal and maxillary sinus pain for over a week, coughing, ear pain No new SOB, sore throat, chest pain Using inhalers, nasal spray, and antihistamines as prescribed Typically gets sinusitis around this time every year  PERTINENT  PMH / PSH: restrictive lung disease, OSA, chronic rhinitis, T2DM, myasthenia gravis  OBJECTIVE:   BP 139/88   Pulse 80   Temp 97.6 F (36.4 C) (Oral)   Ht 5' (1.524 m)   Wt (!) 308 lb 3.2 oz (139.8 kg)   SpO2 99%   BMI 60.19 kg/m   General: well appearing, NAD HEENT: Bilateral Tms clear. TTP over frontal and maxillary sinuses. Nasal congestion. Oropharynx clear.  Cardiovascular: RRR, no m/r/g Respiratory: normal work of breathing on RA, CTAB  ASSESSMENT/PLAN:   Assessment & Plan Acute non-recurrent frontal sinusitis Sx consistent with sinusitis. Very low suspicion for pneumonia.  Given duration of sx and medical history will treat with Amoxicillin  875mg  x5d Return with new or worsening sx     Elyce Prescott, DO [place] [facility]

## 2023-12-25 NOTE — Progress Notes (Signed)
 12/28/2023 Frances Medina [id number] Sep 23, 1960  Referring provider: Anders Otto DASEN, MD Primary GI doctor: Dr. Federico (Dr. Aneita)  ASSESSMENT AND PLAN:  Iron  deficiency anemia with history of microcytic anemia thrombocytopenia 3 years ago 2 months ago 09/11/2023  HGB 11.1 MCV 73.2 Platelets 284 06/10/2023 Iron  41 Ferritin 38.8 B12 629 Recent Labs    03/16/23 1055 05/06/23 1041 05/25/23 0720 05/25/23 0734 06/10/23 0944 09/11/23 1155  HGB 12.0 11.5 11.4* 19.0* 11.7* 11.1*  09/22/2023 colonoscopy at [facility] long normal TI 4 mm polyp ascending 2 polyps 3 to 5 mm rectum, internal hemorrhoids TA polyps recall 7 years 09/22/2023 EGD benign-appearing esophageal stenosis dilated 18 mm, HH, gastritis, normal duodenum no varices, negative H. Pylori No overt GI bleeding She is not on an iron  supplement -Check iron , ferritin, CBC -Consider VCE if continuing iron  def after trial -Consider IV iron   GERD with dysphagia/odynophagia  Odynophagia resolved, continues with regurgitation of liquids and solids No weight loss 06/19/2023 barium swallow nonspecific esophageal motility disorder 13 mm barium tablet passed briskly in stomach Previous empiric treatment with Diflucan  09/22/2023 EGD benign-appearing esophageal stenosis dilated 18 mm, HH, gastritis, normal duodenum no varices, negative H. Pylori History of myesthesia, on allergy  meds for urticaria  Started on nexium  x 10/16 twice a day - finish trial of nexium  BID, if no difference will get esophageal manometry - follow up neurology - possibly worsened by GLP  Personal history of colon polyps Colonoscopy 07/2019 2 polyps 5 to 7 mm descending colon Hyperplastic polyps recall 7 years 09/22/2023 colonoscopy at [facility] long normal TI 4 mm polyp ascending 2 polyps 3 to 5 mm rectum, internal hemorrhoids TA polyps recall 7 years  Metabolic dysfunction associated seatohepatitis (MASH, formerly NASH)  by history and ultrasound, kPA 13, some  concern for advanced fibrosis    Latest Ref Rng & Units 09/11/2023   11:55 AM 06/10/2023    9:44 AM 05/06/2023   10:41 AM  Hepatic Function  Total Protein 6.5 - 8.1 g/dL 7.2  6.4  6.9   Albumin 3.5 - 5.0 g/dL 4.1  4.2  4.4   AST 15 - 41 U/L 18  16  14    ALT 0 - 44 U/L 16  15  12    Alk Phosphatase 38 - 126 U/L 94  82  118   Total Bilirubin 0.0 - 1.2 mg/dL 0.4  0.4  0.2   Bilirubin, Direct 0.0 - 0.3 mg/dL  0.1    MELD 3.0: 7  07/7972 hepatitis panel negative, needs hepatitis A and B vaccination  IgG and ASMA, AMA negative, GGT normal, ANA negative 06/19/2023 coarse echogenic liver diffuse hepatic steatosis no focal lesions normal spleen K PA 13.1 and highly suggestive of advanced chronic liver disease No evidence of varices 09/22/2023 - need LFTs and CBC monitored every 6 months -Suggest right upper quadrant ultrasound every 6 months, suggest recall US  in March -Continue to work on risk factor modification including diet exercise and control of risk factors including blood sugars. - continue ozempic , consider wegovy   Morbid obesity was unable to complete Roux-en-Y due to hepatomegaly 2022 Body mass index is 60.03 kg/m.  -Patient has been advised to make an attempt to improve diet and exercise patterns to aid in weight loss. -Recommended diet heavy in fruits and veggies and low in animal meats, cheeses, and dairy products, appropriate calorie intake -Patient has been advised to make an attempt to improve diet and exercise patterns to aid in weight loss. -Recommended  diet heavy in fruits and veggies and low in animal meats, cheeses, and dairy products, appropriate calorie intake - now on ozempic  ( allergy  to mounjaro ), discussed GLP   Cholelithiasis seen on imaging without inflammation Patient has no symptoms at this time, labs are normal.  -We went over symptoms of cholecystitis, cholangitis and pancreatitis that would prompt her to return to the ER if they happen.  -Advised low fat diet.   -Agrees to monitor at his time.   Type 2 diabetes A1c 8 on 05/27/2023 On CGM Now on ozempic   OSA on CPAP  Myasthenia gravis In remission   Patient Care Team: Anders Otto DASEN, MD as PCP - General (Family Medicine) Loni Soyla LABOR, MD as PCP - Cardiology (Cardiology) Wonda Cy BROCKS, RD as Dietitian (Family Medicine)  HISTORY OF PRESENT ILLNESS: 63 y.o. female with a past medical history listed below presents for evaluation of dysphagia and GERD.   I last saw the patient in the office 08/10/2023 for IDA GERD MASH  Discussed the use of AI scribe software for clinical note transcription with the patient, who gave verbal consent to proceed.  History of Present Illness   Frances Medina is a 63 year old female with iron  deficiency anemia and myasthenia gravis who presents with swallowing difficulties and regurgitation.  She experiences persistent swallowing difficulties and regurgitation, with food and liquids coming back up after eating or drinking, especially when lying down. This occurs with every meal, regardless of the type of food or liquid, and she often needs to sit up to sleep to prevent regurgitation.  She underwent an EGD and colonoscopy on September 22, 2023, which revealed a few polyps, internal hemorrhoids, a small hiatal hernia, benign narrowing, and inflammation with gastritis. The polyps were precancerous but not cancerous, and the narrowing was dilated. Her swallowing has improved since the dilation, and painful swallowing resolved after treatment with Diflucan .  She is currently on Ozempic  for diabetes management, having switched from Mounjaro , which she felt worsened her symptoms. She is unsure about her current iron  supplement use but recalls being on multiple medications, including an inhaler with steroids. She has myasthenia gravis and has a follow-up appointment scheduled for next month.  No dark or bloody stools or weight loss, despite eating only one meal a day. She  denies nausea but reports regurgitation with both solids and liquids, occurring every time she eats.      She  reports that she quit smoking about 11 years ago. Her smoking use included cigarettes. She started smoking about 49 years ago. She has a 57.3 pack-year smoking history. She has been exposed to tobacco smoke. She has never used smokeless tobacco. She reports that she does not drink alcohol and does not use drugs.  RELEVANT GI HISTORY, IMAGING AND LABS: Results   RADIOLOGY Liver elastography: Fatty liver, fibrosis (11/2023)  DIAGNOSTIC Colonoscopy: Polyps, internal hemorrhoids, precancerous polyps (09/22/2023) Endoscopy: Benign narrowing, hiatal hernia, inflammation, gastritis, negative for varices, negative for Helicobacter pylori (09/22/2023) Barium swallow: Esophageal dysmotility (06/2023)      CBC    Component Value Date/Time   WBC 10.2 09/11/2023 1155   RBC 5.03 09/11/2023 1155   HGB 11.1 (L) 09/11/2023 1155   HGB 11.5 05/06/2023 1041   HGB 12.4 10/30/2015 1112   HCT 36.8 09/11/2023 1155   HCT 36.9 05/06/2023 1041   HCT 38.9 10/30/2015 1112   PLT 284 09/11/2023 1155   PLT 159 05/06/2023 1041   MCV 73.2 (L) 09/11/2023 1155  MCV 71 (L) 05/06/2023 1041   MCV 72.2 (L) 10/30/2015 1112   MCH 22.1 (L) 09/11/2023 1155   MCHC 30.2 09/11/2023 1155   RDW 19.4 (H) 09/11/2023 1155   RDW 16.9 (H) 05/06/2023 1041   RDW 19.0 (H) 10/30/2015 1112   LYMPHSABS 2.0 09/11/2023 1155   LYMPHSABS 1.6 05/06/2023 1041   LYMPHSABS 1.0 10/30/2015 1112   MONOABS 0.4 09/11/2023 1155   MONOABS 0.4 10/30/2015 1112   EOSABS 0.0 09/11/2023 1155   EOSABS 0.0 05/06/2023 1041   BASOSABS 0.0 09/11/2023 1155   BASOSABS 0.0 05/06/2023 1041   BASOSABS 0.0 10/30/2015 1112   Recent Labs    03/16/23 1055 05/06/23 1041 05/25/23 0720 05/25/23 0734 06/10/23 0944 09/11/23 1155  HGB 12.0 11.5 11.4* 19.0* 11.7* 11.1*    CMP     Component Value Date/Time   NA 140 09/11/2023 1155   NA 144  05/06/2023 1041   NA 141 10/30/2015 1112   K 4.0 09/11/2023 1155   K 4.2 10/30/2015 1112   CL 103 09/11/2023 1155   CO2 26 09/11/2023 1155   CO2 26 10/30/2015 1112   GLUCOSE 96 09/11/2023 1155   GLUCOSE 94 10/30/2015 1112   BUN 13 09/11/2023 1155   BUN 11 05/06/2023 1041   BUN 21.6 10/30/2015 1112   CREATININE 0.75 09/11/2023 1155   CREATININE 0.72 11/07/2015 0911   CREATININE 0.7 10/30/2015 1112   CALCIUM 10.0 09/11/2023 1155   CALCIUM 9.9 10/30/2015 1112   PROT 7.2 09/11/2023 1155   PROT 6.9 05/06/2023 1041   PROT 7.1 10/30/2015 1112   ALBUMIN 4.1 09/11/2023 1155   ALBUMIN 4.4 05/06/2023 1041   ALBUMIN 3.4 (L) 10/30/2015 1112   AST 18 09/11/2023 1155   AST 13 10/30/2015 1112   ALT 16 09/11/2023 1155   ALT 16 10/30/2015 1112   ALKPHOS 94 09/11/2023 1155   ALKPHOS 80 10/30/2015 1112   BILITOT 0.4 09/11/2023 1155   BILITOT 0.2 05/06/2023 1041   BILITOT 0.40 10/30/2015 1112   GFRNONAA >60 09/11/2023 1155   GFRNONAA >89 11/07/2015 0911   GFRAA 109 08/26/2019 1132   GFRAA >89 11/07/2015 0911      Latest Ref Rng & Units 09/11/2023   11:55 AM 06/10/2023    9:44 AM 05/06/2023   10:41 AM  Hepatic Function  Total Protein 6.5 - 8.1 g/dL 7.2  6.4  6.9   Albumin 3.5 - 5.0 g/dL 4.1  4.2  4.4   AST 15 - 41 U/L 18  16  14    ALT 0 - 44 U/L 16  15  12    Alk Phosphatase 38 - 126 U/L 94  82  118   Total Bilirubin 0.0 - 1.2 mg/dL 0.4  0.4  0.2   Bilirubin, Direct 0.0 - 0.3 mg/dL  0.1        Current Medications:   Current Outpatient Medications (Endocrine & Metabolic):    metFORMIN (GLUCOPHAGE-XR) 500 MG 24 hr tablet, Take 1,000 mg by mouth.   metFORMIN (GLUCOPHAGE-XR) 500 MG 24 hr tablet, Take 2 tablets (1,000 mg total) by mouth daily with dinner   Semaglutide , 1 MG/DOSE, 4 MG/3ML SOPN, Inject 1 mg into the skin once a week.   Current Outpatient Medications (Cardiovascular):    EPINEPHrine  0.3 mg/0.3 mL IJ SOAJ injection, Inject 0.3 mg into the muscle as needed for anaphylaxis.    lisinopril  (ZESTRIL ) 2.5 MG tablet, Take 1 tablet (2.5 mg total) by mouth at bedtime.   Current Outpatient Medications (Respiratory):  albuterol  (PROVENTIL ) (2.5 MG/3ML) 0.083% nebulizer solution, Take 3 mLs (2.5 mg total) by nebulization every 6 (six) hours as needed for wheezing or shortness of breath.   albuterol  (VENTOLIN  HFA) 108 (90 Base) MCG/ACT inhaler, Inhale 2 puffs into the lungs every 6 (six) hours as needed for wheezing or shortness of breath   fexofenadine  (ALLEGRA ) 180 MG tablet, Take 1 tablet (180 mg total) by mouth in the morning and at bedtime.   fluticasone  (FLONASE ) 50 MCG/ACT nasal spray, Place 2 sprays into both nostrils daily.   montelukast  (SINGULAIR ) 10 MG tablet, Take 1 tablet (10 mg total) by mouth at bedtime.   omalizumab  (XOLAIR ) 300 MG/2  ML prefilled syringe, Inject 300 mg into the skin every 14 (fourteen) days.   WIXELA INHUB 250-50 MCG/ACT AEPB, INHALE 1 PUFF INTO THE LUNGS IN THE MORNING AND AT BEDTIME.  Current Facility-Administered Medications (Respiratory):    omalizumab  (XOLAIR ) prefilled syringe 300 mg  Current Outpatient Medications (Analgesics):    acetaminophen  (TYLENOL ) 500 MG tablet, Take 500 mg by mouth in the morning and at bedtime.   Current Outpatient Medications (Hematological):    Cyanocobalamin  (VITAMIN B12 PO), Take 1 tablet by mouth daily.   Current Outpatient Medications (Other):    Accu-Chek Softclix Lancets lancets, Use as instructed   amoxicillin  (AMOXIL ) 875 MG tablet, Take 1 tablet (875 mg total) by mouth 2 (two) times daily for 5 days.   APPLE CIDER VINEGAR PO, Take 1 capsule by mouth daily.   Ascorbic Acid (VITAMIN C PO), Take 1,000 mg by mouth daily.   Blood Glucose Monitoring Suppl (ACCU-CHEK GUIDE ME) w/Device KIT, Use as directed.   clonazePAM  (KLONOPIN ) 0.5 MG tablet, TAKE 1 TABLET BY MOUTH TWICE A DAY AS NEEDED FOR ANXIETY   Continuous Glucose Sensor (FREESTYLE LIBRE 3 PLUS SENSOR) MISC, Change sensor every 15 days.  (Patient taking differently: as needed. Change sensor every 15 days.)   esomeprazole  (NEXIUM ) 40 MG capsule, TAKE 40 MG TWICE DAILY FOR 8 WEEKS, THEN DECREASE TO DAILY.   famotidine  (PEPCID ) 20 MG tablet, Take 1 tablet (20 mg total) by mouth 2 (two) times daily as needed for heartburn or indigestion (hives).   famotidine  (PEPCID ) 40 MG tablet, Take 1 tablet (40 mg total) by mouth 2 (two) times daily.   glucose blood (ACCU-CHEK SMARTVIEW) test strip, Use as instructed (Patient taking differently: 1 each as needed. Use as instructed)   halobetasol  (ULTRAVATE ) 0.05 % cream, Apply topically 2 (two) times daily.   hydrOXYzine  (ATARAX ) 10 MG tablet, Take 1 tablet (10 mg total) by mouth at bedtime as needed for itching (or hives).   Lancets Misc. (ACCU-CHEK SOFTCLIX LANCET DEV) KIT, Use as directed   Menthol  10 MG LOZG, Take 1 lozenge by mouth daily as needed (cough).   mycophenolate  (CELLCEPT ) 500 MG tablet, Take 500 mg by mouth 2 (two) times daily.   Na Sulfate-K Sulfate-Mg Sulfate concentrate (SUPREP BOWEL PREP KIT) 17.5-3.13-1.6 GM/177ML SOLN, Take 1 kit (354 mLs total) by mouth as directed.   nystatin  (MYCOSTATIN /NYSTOP ) powder, APPLY TOPICALLY 3 TIMES DAILY AS NEEDED.   sertraline  (ZOLOFT ) 50 MG tablet, Take 3 tablets (150 mg total) by mouth daily.   VITAMIN D , CHOLECALCIFEROL, PO, Take 1 tablet by mouth daily. otc   Medical History:  Past Medical History:  Diagnosis Date   Acute respiratory failure (HCC)    Acute respiratory failure with hypoxemia (HCC)    Aspiration into airway    Back pain 07/31/2017   Benign neoplasm of rectum  CAP (community acquired pneumonia) 08/05/2015   Depression with anxiety    Diabetes mellitus, new onset (HCC) 06/02/2023   Diverticulosis    Dizziness 03/13/2023   Dysphagia 03/2015   EGD, Dr Avram. mild antral gastritis, ? due to Ibuprofen .  no stricture but empirically maloney dilated esophagus.    Dysphagia, neurologic    E. coli UTI 04/07/2015    Elevated LDH 05/06/2015   Encounter for routine gynecological examination 10/19/2015   Endotracheally intubated    Enteritis due to Clostridium difficile    Esophageal dysmotility 11/08/2015   Fibroid uterus    size of a dime   Gastrostomy infection (HCC) 11/08/2015   Gastrostomy infection (HCC) 11/08/2015   GERD (gastroesophageal reflux disease)    hiatal hernia   Hypertension    went away when I stopped smoking   Knee injury    Left hip pain 02/18/2017   Migraine    maybe couple times/month (03/20/2015)   Mild intermittent asthma 06/22/2017   Myasthenia gravis (HCC) 2017   Myasthenia gravis with acute exacerbation (HCC) 05/15/2015   Osteoarthritis of left knee 12/02/2013   Osteoarthritis of right knee 08/30/2013   Pancreatitis 07/25/2017   Protein-calorie malnutrition, severe 08/07/2015   Seasonal allergies    takes Zytrec   Shortness of breath    Sinusitis 02/25/2018   Sleep apnea    with cPAp machine   Tracheostomy status (HCC)    Tubular adenoma of colon    Tumors    in my stomach   Umbilical hernia    watching , no plans for surgery at present   Urine incontinence 10/19/2015   Urticaria    VAP (ventilator-associated pneumonia) (HCC)    Allergies:  Allergies  Allergen Reactions   Ciprofloxacin  Shortness Of Breath and Rash   Naproxen  Shortness Of Breath   Shrimp [Shellfish Allergy ] Hives and Shortness Of Breath    ER required   Dicyclomine Hives   Methocarbamol  Hives   Pineapple Itching    Itchy lips and tongue   Morphine  And Codeine  Other (See Comments)    Severe headache   Mounjaro  [Tirzepatide ] Rash     Surgical History:  She  has a past surgical history that includes Cesarean section (8012; 1989); Dilation and curettage of uterus; Tubal ligation (1989); Vaginal hysterectomy (1990's?); Partial knee arthroplasty (Right, 08/30/2013); Colonoscopy with propofol  (N/A, 09/19/2013); Partial knee arthroplasty (Left, 12/02/2013); Esophagogastroduodenoscopy  (egd) with propofol  (N/A, 03/21/2015); Esophagogastroduodenoscopy (N/A, 08/10/2015); PEG placement (N/A, 08/10/2015); Colonoscopy with propofol  (N/A, 07/05/2019); Esophagogastroduodenoscopy (egd) with propofol  (N/A, 07/05/2019); polypectomy (07/05/2019); biopsy (07/05/2019); Colonoscopy (N/A, 09/22/2023); and Esophagogastroduodenoscopy (N/A, 09/22/2023). Family History:  Her family history includes Allergic rhinitis in her sister and sister; Anemia in her sister; Atopy in her sister; COPD in her brother; Heart disease (age of onset: 74) in her mother; Hypertension in her father and mother; Leukemia in her sister and sister; Other in her sister; Stroke in her father.  REVIEW OF SYSTEMS  : All other systems reviewed and negative except where noted in the History of Present Illness.  PHYSICAL EXAM: BP (!) 140/82   Pulse 90   Ht 5' (1.524 m)   Wt (!) 307 lb 6 oz (139.4 kg)   BMI 60.03 kg/m  Physical Exam   GENERAL APPEARANCE: morbidly obese, in no apparent distress. HEENT: Oral cavity without white plaques or yeast. No cervical lymphadenopathy, unremarkable thyroid , sclerae anicteric, conjunctiva pink. RESPIRATORY: Respiratory effort normal, lungs clear to auscultation bilaterally without rales, rhonchi, or wheezing. CARDIO: Heart with occasional  trigeminy, normal rhythm. Peripheral pulses intact. ABDOMEN: Soft, non-distended, obese, active bowel sounds in all 4 quadrants, no pain no rebound, no mass appreciated. RECTAL: Declines. MUSCULOSKELETAL: Full ROM, normal gait, without edema. SKIN: Dry, intact without rashes or lesions. No jaundice. Hives along bilateral arms.. NEURO: Alert, oriented, no focal deficits. PSYCH: Cooperative, normal mood and affect. EXTREMITIES: 1+ edema in legs, no redness or warmth.      Alan JONELLE Coombs, PA-C 10:06 AM

## 2023-12-28 ENCOUNTER — Other Ambulatory Visit

## 2023-12-28 ENCOUNTER — Encounter: Payer: Self-pay | Admitting: Physician Assistant

## 2023-12-28 ENCOUNTER — Ambulatory Visit: Admitting: Physician Assistant

## 2023-12-28 VITALS — BP 140/82 | HR 90 | Ht 60.0 in | Wt 307.4 lb

## 2023-12-28 DIAGNOSIS — D509 Iron deficiency anemia, unspecified: Secondary | ICD-10-CM

## 2023-12-28 DIAGNOSIS — K8020 Calculus of gallbladder without cholecystitis without obstruction: Secondary | ICD-10-CM

## 2023-12-28 DIAGNOSIS — R7989 Other specified abnormal findings of blood chemistry: Secondary | ICD-10-CM | POA: Diagnosis not present

## 2023-12-28 DIAGNOSIS — K219 Gastro-esophageal reflux disease without esophagitis: Secondary | ICD-10-CM | POA: Diagnosis not present

## 2023-12-28 DIAGNOSIS — R1110 Vomiting, unspecified: Secondary | ICD-10-CM | POA: Diagnosis not present

## 2023-12-28 DIAGNOSIS — R1319 Other dysphagia: Secondary | ICD-10-CM

## 2023-12-28 DIAGNOSIS — Z860102 Personal history of hyperplastic colon polyps: Secondary | ICD-10-CM

## 2023-12-28 DIAGNOSIS — Z860101 Personal history of adenomatous and serrated colon polyps: Secondary | ICD-10-CM

## 2023-12-28 DIAGNOSIS — K760 Fatty (change of) liver, not elsewhere classified: Secondary | ICD-10-CM

## 2023-12-28 DIAGNOSIS — Z6841 Body Mass Index (BMI) 40.0 and over, adult: Secondary | ICD-10-CM

## 2023-12-28 LAB — COMPREHENSIVE METABOLIC PANEL WITH GFR
ALT: 10 U/L (ref 0–35)
AST: 13 U/L (ref 0–37)
Albumin: 4.2 g/dL (ref 3.5–5.2)
Alkaline Phosphatase: 74 U/L (ref 39–117)
BUN: 11 mg/dL (ref 6–23)
CO2: 27 meq/L (ref 19–32)
Calcium: 9.1 mg/dL (ref 8.4–10.5)
Chloride: 106 meq/L (ref 96–112)
Creatinine, Ser: 0.57 mg/dL (ref 0.40–1.20)
GFR: 97.26 mL/min (ref >60.00)
Glucose, Bld: 100 mg/dL — ABNORMAL HIGH (ref 70–99)
Potassium: 3.6 meq/L (ref 3.5–5.1)
Sodium: 142 meq/L (ref 135–145)
Total Bilirubin: 0.3 mg/dL (ref 0.2–1.2)
Total Protein: 6.9 g/dL (ref 6.0–8.3)

## 2023-12-28 LAB — HEPATIC FUNCTION PANEL
ALT: 10 U/L (ref 0–35)
AST: 13 U/L (ref 0–37)
Albumin: 4.2 g/dL (ref 3.5–5.2)
Alkaline Phosphatase: 74 U/L (ref 39–117)
Bilirubin, Direct: 0.2 mg/dL (ref 0.0–0.3)
Total Bilirubin: 0.3 mg/dL (ref 0.2–1.2)
Total Protein: 6.9 g/dL (ref 6.0–8.3)

## 2023-12-28 LAB — CBC WITH DIFFERENTIAL/PLATELET
Basophils Absolute: 0 K/uL (ref 0.0–0.1)
Basophils Relative: 0.1 % (ref 0.0–3.0)
Eosinophils Absolute: 0 K/uL (ref 0.0–0.7)
Eosinophils Relative: 0 % (ref 0.0–5.0)
HCT: 34.4 % — ABNORMAL LOW (ref 36.0–46.0)
Hemoglobin: 10.8 g/dL — ABNORMAL LOW (ref 12.0–15.0)
Lymphocytes Relative: 17.4 % (ref 12.0–46.0)
Lymphs Abs: 1.3 K/uL (ref 0.7–4.0)
MCHC: 31.3 g/dL (ref 30.0–36.0)
MCV: 68.1 fl — ABNORMAL LOW (ref 78.0–100.0)
Monocytes Absolute: 0.3 K/uL (ref 0.1–1.0)
Monocytes Relative: 4.6 % (ref 3.0–12.0)
Neutro Abs: 5.9 K/uL (ref 1.4–7.7)
Neutrophils Relative %: 77.9 % — ABNORMAL HIGH (ref 43.0–77.0)
Platelets: 210 K/uL (ref 150.0–400.0)
RBC: 5.05 Mil/uL (ref 3.87–5.11)
RDW: 19.6 % — ABNORMAL HIGH (ref 11.5–15.5)
WBC: 7.6 K/uL (ref 4.0–10.5)

## 2023-12-28 LAB — IBC + FERRITIN
Ferritin: 29.4 ng/mL (ref 10.0–291.0)
Iron: 29 ug/dL — ABNORMAL LOW (ref 42–145)
Saturation Ratios: 7.9 % — ABNORMAL LOW (ref 20.0–50.0)
TIBC: 365.4 ug/dL (ref 250.0–450.0)
Transferrin: 261 mg/dL (ref 212.0–360.0)

## 2023-12-28 NOTE — Patient Instructions (Addendum)
 Your provider has requested that you go to the basement level for lab work before leaving today. Press B on the elevator. The lab is located at the first door on the left as you exit the elevator.  Behavioral and Dietary Strategies for Management of Esophageal Dysmotility/dysphagia 1. Take reflux medications 30+ minutes before food in the morning.  2. Begin meals with warm beverage 3. Eat smaller more frequent meals 4. Eat slowly, taking small bites and sips 5. Alternate solids and liquids 6. Avoid foods/liquids that increase acid production 7. Sit upright during and for 30+ minutes after meals to facilitate esophageal clearing 8. Can try altoid melting in mouth before food  Metabolic dysfunction associated seatohepatitis  Now the leading cause of liver failure in the [place] .  It is normally from such risk factors as obesity, diabetes, insulin  resistance, high cholesterol, or metabolic syndrome.  The only definitive therapy is weight loss and exercise.   Suggest walking 20-30 mins daily.  Decreasing carbohydrates, increasing veggies.    Fatty Liver Fatty liver is the accumulation of fat in liver cells. It is also called hepatosteatosis or steatohepatitis. It is normal for your liver to contain some fat. If fat is more than 5 to 10% of your liver's weight, you have fatty liver.  There are often no symptoms (problems) for years while damage is still occurring. People often learn about their fatty liver when they have medical tests for other reasons. Fat can damage your liver for years or even decades without causing problems. When it becomes severe, it can cause fatigue, weight loss, weakness, and confusion. This makes you more likely to develop more serious liver problems. The liver is the largest organ in the body. It does a lot of work and often gives no warning signs when it is sick until late in a disease. The liver has many important jobs including: Breaking down  foods. Storing vitamins, iron , and other minerals. Making proteins. Making bile for food digestion. Breaking down many products including medications, alcohol and some poisons.  PROGNOSIS  Fatty liver may cause no damage or it can lead to an inflammation of the liver. This is, called steatohepatitis.  Over time the liver may become scarred and hardened. This condition is called cirrhosis. Cirrhosis is serious and may lead to liver failure or cancer. NASH is one of the leading causes of cirrhosis. About 10-20% of Americans have fatty liver and a smaller 2-5% has NASH.  TREATMENT  Weight loss, fat restriction, and exercise in overweight patients produces inconsistent results but is worth trying. Good control of diabetes may reduce fatty liver. Eat a balanced, healthy diet. Increase your physical activity. There are no medical or surgical treatments for a fatty liver or NASH, but improving your diet and increasing your exercise may help prevent or reverse some of the damage.   Due to recent changes in healthcare laws, you may see the results of your imaging and laboratory studies on MyChart before your provider has had a chance to review them.  We understand that in some cases there may be results that are confusing or concerning to you. Not all laboratory results come back in the same time frame and the provider may be waiting for multiple results in order to interpret others.  Please give us  48 hours in order for your provider to thoroughly review all the results before contacting the office for clarification of your results.    I appreciate the  opportunity to care for you  Thank You   Amanda Collier,PA-C

## 2023-12-30 ENCOUNTER — Telehealth: Payer: Self-pay | Admitting: *Deleted

## 2023-12-30 NOTE — Telephone Encounter (Signed)
-----   Message from Arleta SHAUNNA Blanch sent at 12/22/2023 10:20 AM EDT ----- Walterine Milling Can we please stop her Xolair  and start Rhapsido 25mg  BID?

## 2023-12-30 NOTE — Telephone Encounter (Signed)
 L/m for patient to contact me to advise approval, copay card and submit to [employer] for Rhapsido

## 2024-01-01 ENCOUNTER — Other Ambulatory Visit (HOSPITAL_COMMUNITY): Payer: Self-pay

## 2024-01-01 ENCOUNTER — Ambulatory Visit

## 2024-01-04 ENCOUNTER — Telehealth: Payer: Self-pay

## 2024-01-04 ENCOUNTER — Encounter: Payer: Self-pay | Admitting: Radiology

## 2024-01-04 NOTE — Telephone Encounter (Signed)
 Patient calls nurse line regarding continued concerns with hives.   Hives are only from the waist down.   Allergy  specialist stopped Xolair  injections and are trying to start patient on Rhapsido. This appears to be pending prior authorization.   Patient is also having difficulty lying on her back.If she is lying on her back, she experiences aching in her hip and into her leg.  She is requesting referral for PT on [address].   Advised patient that we would likely need appointment to ensure that appropriate referral is placed. Scheduled with PCP for next available on 01/27/24.  Chiquita JAYSON English, RN

## 2024-01-05 ENCOUNTER — Ambulatory Visit: Admitting: Family Medicine

## 2024-01-05 ENCOUNTER — Other Ambulatory Visit (HOSPITAL_COMMUNITY): Payer: Self-pay

## 2024-01-05 ENCOUNTER — Encounter: Payer: Self-pay | Admitting: Family Medicine

## 2024-01-05 VITALS — BP 139/87 | HR 100 | Wt 306.8 lb

## 2024-01-05 DIAGNOSIS — L501 Idiopathic urticaria: Secondary | ICD-10-CM | POA: Diagnosis not present

## 2024-01-05 DIAGNOSIS — E1169 Type 2 diabetes mellitus with other specified complication: Secondary | ICD-10-CM

## 2024-01-05 DIAGNOSIS — G7000 Myasthenia gravis without (acute) exacerbation: Secondary | ICD-10-CM | POA: Diagnosis not present

## 2024-01-05 DIAGNOSIS — M1612 Unilateral primary osteoarthritis, left hip: Secondary | ICD-10-CM | POA: Diagnosis not present

## 2024-01-05 DIAGNOSIS — M5416 Radiculopathy, lumbar region: Secondary | ICD-10-CM | POA: Diagnosis not present

## 2024-01-05 MED ORDER — HALOBETASOL PROPIONATE 0.05 % EX CREA
TOPICAL_CREAM | Freq: Two times a day (BID) | CUTANEOUS | 0 refills | Status: DC
  Filled 2024-01-05: qty 50, 25d supply, fill #0

## 2024-01-05 NOTE — Telephone Encounter (Signed)
 Spoke with patient. She will see me today at 2:10 pm.  Nov 26th appointment cancelled. She agreed with the plan.

## 2024-01-05 NOTE — Progress Notes (Signed)
 SUBJECTIVE:   CHIEF COMPLAINT / HPI:   Discussed the use of AI scribe software for clinical note transcription with the patient, who gave verbal consent to proceed.  History of Present Illness   Frances Medina is a 63 year old female who presents with kin rash and itching, lower back and hip pain.  She has been experiencing skin rash with severe itching for over a year, affecting her back, legs, arms, and face, with symptoms worsening at night. She was previously on Xolair , which was discontinued due to worsening symptoms, and is now starting a trial of Rhapsido.  She also uses Aloe vera lotion, kept refrigerated, which help calm the itching.  She experiences pain in her lower extremities, especially when lying on her left side, causing numbness and aching down to her knee. This has been occurring for about two weeks. She rates her hip pain as seven out of ten and uses Tylenol  for relief, which helps. The pain sometimes radiates to the other leg, affecting her right knee, which feels like it might give out. She also reports lower back pain, rated as seven out of ten, which she believes may radiate to her legs. No recent trauma or falls are reported.  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 She also has arthritis in her left hip, as shown in an x-ray from August 2025, and arthritis in the left joint from a 2019 x-ray. She has not had any back surgery.  She is currently taking famotidine  20 mg twice daily, Allegra  180 mg twice daily, and Ozempic  1 mg weekly. She is not taking metformin, although it appears in her records.       PERTINENT  PMH / PSH: PMHx reviewed  OBJECTIVE:   BP 139/87   Pulse 100   Wt (!) 306 lb 12.8 oz (139.2 kg)   SpO2 95%   BMI 59.92 kg/m   Physical Exam   CHEST: Lungs clear to auscultation, no wheezing. CARDIOVASCULAR: Heart sounds normal, no murmurs, regular  rate. MUSCULOSKELETAL: Lateral left hip tenderness with palpation. No tenderness of the lumbar spine. Normal ROM of her lumbar spine.  SKIN: Maculopapular rash, erythematous on ventral surface of arm, mostly on right elbow area. Maculopapular rash on back, lower back, and lower extremities, less on lower extremities. Mild scanty maculopapular rash on face.      Results   RADIOLOGY Lumbar spine CT: Severe arthritis of L4, L5 and L2, L3 (2016) Hip X-ray: Moderate arthritis of left hip, right joint space preserved (10/2023)       ASSESSMENT/PLAN:   Assessment & Plan Primary osteoarthritis of left hip Lumbar spine and left hip osteoarthritis with associated lower back and lower extremity pain Sometimes have right hip pain as well Chronic lumbar spine and left hip osteoarthritis with worsening symptoms. Severe arthritis at L4-L5 and L2-L3. Pain 7/10, radiating to legs, exacerbated by certain positions. Previous imaging shows mild left hip arthritis. - Referred to physical therapy for hip and back pain management. - Referred to spine specialist for further evaluation. - Continue Tylenol  as needed for pain. Lumbar radiculopathy Lumbar spine and left hip osteoarthritis with associated lower back and lower extremity pain Sometimes have right hip pain as well Chronic lumbar spine and left hip osteoarthritis with worsening symptoms. Severe arthritis at L4-L5 and L2-L3. Pain 7/10, radiating to legs, exacerbated by certain positions. Previous imaging shows mild left hip  arthritis. - Referred to physical therapy for hip and back pain management. - Referred to spine specialist for further evaluation. - Continue Tylenol  as needed for pain. Idiopathic urticaria Chronic pruritic maculopapular rash for over a year, primarily on back, legs, and arms, with increased nocturnal itching. Previous Xolair  treatment discontinued. Allergist approved Rhapsido trial. Previous topical steroid ineffective. - Continue  follow-up with allergist for rash management. - Use halobetasol  mixed with Eucerin cream twice daily for rash. - Refilled halobetasol  prescription. Type 2 diabetes mellitus with other specified complication, without long-term current use of insulin  (HCC) Type 2 diabetes mellitus managed with Ozempic . Assessing potential Ozempic  reaction as rash cause. Metformin not currently taken. Discussed increased hunger and glucose monitoring during Ozempic  hold. - Hold Ozempic  for three weeks to assess for rash improvement. - Take metformin 500 mg extended release once daily while off Ozempic . - Monitor glucose levels at least twice daily. - Scheduled follow-up appointment in two weeks to reassess diabetes management Morbid obesity (HCC) Holding Ozempic  for now due to chronic rash Continue lifestyle modification Myasthenia gravis (HCC) Stable     Otto Fairly, MD [facility]

## 2024-01-05 NOTE — Assessment & Plan Note (Addendum)
 Type 2 diabetes mellitus managed with Ozempic . Assessing potential Ozempic  reaction as rash cause. Metformin not currently taken. Discussed increased hunger and glucose monitoring during Ozempic  hold. - Hold Ozempic  for three weeks to assess for rash improvement. - Take metformin 500 mg extended release once daily while off Ozempic . - Monitor glucose levels at least twice daily. - Scheduled follow-up appointment in two weeks to reassess diabetes management

## 2024-01-05 NOTE — Telephone Encounter (Signed)
 Spoke to patient and advised approval, copay card and submit to [employer] for Rhapsido to replace Xolair 

## 2024-01-05 NOTE — Patient Instructions (Signed)
 It was nice seeing you. I am sorry about the pain and the skin rash. I will place PT and orthopedic referral for your hip and back pain. Continue Tylenol  as needed for pain. For the skin rash, continue to follow up with allergist. In the meantime, we will continue your Halobetasol  and add this to Eucerin cream to use twice daily.  Please get off ozempic  for 3 weeks to assess whether you are reacting to this medication or not. Please monitor your glucose closely while on Metformin 500 mg every day.

## 2024-01-05 NOTE — Assessment & Plan Note (Addendum)
 Holding Ozempic  for now due to chronic rash Continue lifestyle modification

## 2024-01-05 NOTE — Assessment & Plan Note (Signed)
 Lumbar spine and left hip osteoarthritis with associated lower back and lower extremity pain Sometimes have right hip pain as well Chronic lumbar spine and left hip osteoarthritis with worsening symptoms. Severe arthritis at L4-L5 and L2-L3. Pain 7/10, radiating to legs, exacerbated by certain positions. Previous imaging shows mild left hip arthritis. - Referred to physical therapy for hip and back pain management. - Referred to spine specialist for further evaluation. - Continue Tylenol  as needed for pain.

## 2024-01-05 NOTE — Assessment & Plan Note (Addendum)
 Stable

## 2024-01-05 NOTE — Assessment & Plan Note (Addendum)
 Lumbar spine and left hip osteoarthritis with associated lower back and lower extremity pain Sometimes have right hip pain as well Chronic lumbar spine and left hip osteoarthritis with worsening symptoms. Severe arthritis at L4-L5 and L2-L3. Pain 7/10, radiating to legs, exacerbated by certain positions. Previous imaging shows mild left hip arthritis. - Referred to physical therapy for hip and back pain management. - Referred to spine specialist for further evaluation. - Continue Tylenol  as needed for pain.

## 2024-01-05 NOTE — Assessment & Plan Note (Addendum)
 Chronic pruritic maculopapular rash for over a year, primarily on back, legs, and arms, with increased nocturnal itching. Previous Xolair  treatment discontinued. Allergist approved Rhapsido trial. Previous topical steroid ineffective. - Continue follow-up with allergist for rash management. - Use halobetasol  mixed with Eucerin cream twice daily for rash. - Refilled halobetasol  prescription.

## 2024-01-06 ENCOUNTER — Other Ambulatory Visit (HOSPITAL_COMMUNITY): Payer: Self-pay

## 2024-01-06 ENCOUNTER — Other Ambulatory Visit: Payer: Self-pay

## 2024-01-07 ENCOUNTER — Other Ambulatory Visit (HOSPITAL_COMMUNITY): Payer: Self-pay

## 2024-01-07 MED ORDER — MYCOPHENOLATE MOFETIL 500 MG PO TABS
1000.0000 mg | ORAL_TABLET | Freq: Two times a day (BID) | ORAL | 3 refills | Status: DC
  Filled 2024-01-07: qty 360, 90d supply, fill #0

## 2024-01-09 ENCOUNTER — Other Ambulatory Visit (HOSPITAL_COMMUNITY): Payer: Self-pay

## 2024-01-11 ENCOUNTER — Encounter: Payer: Self-pay | Admitting: Family Medicine

## 2024-01-20 ENCOUNTER — Other Ambulatory Visit: Payer: Self-pay | Admitting: Family Medicine

## 2024-01-20 ENCOUNTER — Encounter: Payer: Self-pay | Admitting: Family Medicine

## 2024-01-20 ENCOUNTER — Other Ambulatory Visit (HOSPITAL_COMMUNITY): Payer: Self-pay

## 2024-01-20 ENCOUNTER — Other Ambulatory Visit: Payer: Self-pay

## 2024-01-20 MED ORDER — CLONAZEPAM 0.5 MG PO TABS
ORAL_TABLET | ORAL | 1 refills | Status: DC
  Filled 2024-01-20: qty 60, 30d supply, fill #0

## 2024-01-20 MED FILL — Sertraline HCl Tab 50 MG: ORAL | 90 days supply | Qty: 270 | Fill #0 | Status: CN

## 2024-01-23 ENCOUNTER — Other Ambulatory Visit (HOSPITAL_COMMUNITY): Payer: Self-pay

## 2024-01-26 ENCOUNTER — Encounter: Payer: Self-pay | Admitting: Family Medicine

## 2024-01-26 NOTE — Telephone Encounter (Signed)
 Spoke with patient. Informed of note left by provider. Patient understood. Patient also stated that since she has started taking the pills for her allergy  her Sx are so much better. Nelson Land, CMA

## 2024-01-27 ENCOUNTER — Ambulatory Visit: Admitting: Family Medicine

## 2024-02-02 ENCOUNTER — Other Ambulatory Visit: Payer: Self-pay

## 2024-02-02 ENCOUNTER — Other Ambulatory Visit (HOSPITAL_COMMUNITY): Payer: Self-pay

## 2024-02-02 ENCOUNTER — Encounter: Payer: Self-pay | Admitting: Family Medicine

## 2024-02-02 ENCOUNTER — Ambulatory Visit: Admitting: Family Medicine

## 2024-02-02 ENCOUNTER — Ambulatory Visit: Payer: Self-pay | Admitting: Family Medicine

## 2024-02-02 DIAGNOSIS — E1169 Type 2 diabetes mellitus with other specified complication: Secondary | ICD-10-CM | POA: Diagnosis present

## 2024-02-02 DIAGNOSIS — J310 Chronic rhinitis: Secondary | ICD-10-CM

## 2024-02-02 DIAGNOSIS — Z23 Encounter for immunization: Secondary | ICD-10-CM

## 2024-02-02 DIAGNOSIS — L501 Idiopathic urticaria: Secondary | ICD-10-CM

## 2024-02-02 DIAGNOSIS — G7000 Myasthenia gravis without (acute) exacerbation: Secondary | ICD-10-CM

## 2024-02-02 DIAGNOSIS — E118 Type 2 diabetes mellitus with unspecified complications: Secondary | ICD-10-CM | POA: Diagnosis not present

## 2024-02-02 DIAGNOSIS — I10 Essential (primary) hypertension: Secondary | ICD-10-CM | POA: Diagnosis not present

## 2024-02-02 LAB — POCT GLYCOSYLATED HEMOGLOBIN (HGB A1C): Hemoglobin A1C: 5.6 % (ref 4.0–5.6)

## 2024-02-02 MED ORDER — AZELASTINE HCL 0.1 % NA SOLN
1.0000 | Freq: Two times a day (BID) | NASAL | 1 refills | Status: AC
  Filled 2024-02-02: qty 30, 30d supply, fill #0

## 2024-02-02 MED ORDER — MOUNJARO 5 MG/0.5ML ~~LOC~~ SOAJ
5.0000 mg | SUBCUTANEOUS | 0 refills | Status: DC
  Filled 2024-02-02: qty 2, 28d supply, fill #0

## 2024-02-02 MED ORDER — LISINOPRIL 5 MG PO TABS
5.0000 mg | ORAL_TABLET | Freq: Every day | ORAL | 1 refills | Status: AC
  Filled 2024-02-02: qty 90, 90d supply, fill #0

## 2024-02-02 NOTE — Assessment & Plan Note (Addendum)
 Switch Ozempic to Mounjaro 

## 2024-02-02 NOTE — Assessment & Plan Note (Addendum)
 Chronic urticaria: Idiopathic Rapsodil 25 mg twice daily effective in managing symptoms. - Continue Rapsodil 25 mg twice daily.

## 2024-02-02 NOTE — Assessment & Plan Note (Addendum)
 Acute on Chronic No signs of infection Nasal congestion and epistaxis likely due to allergies. Flonase  provides some relief. Home renovation may contribute. - Continue Flonase  nasal spray once daily. - Use Singulair  at bedtime. - Prescribed Astelin spray. - Monitor for worsening symptoms or brown discharge.

## 2024-02-02 NOTE — Assessment & Plan Note (Addendum)
 Managed with Cellcept  500 mg twice daily. - Continue Cellcept  500 mg twice daily.

## 2024-02-02 NOTE — Progress Notes (Addendum)
 SUBJECTIVE:   CHIEF COMPLAINT / HPI:   Discussed the use of AI scribe software for clinical note transcription with the patient, who gave verbal consent to proceed.  History of Present Illness   Frances Medina is a 63 year old female with myasthenia gravis who presents for medication review and management.  She is managing her myasthenia gravis with Cellcept  (mycophenolate ) 500 mg twice daily and adheres to this regimen. She uses levalbuterol, Klonopin , and an EpiPen  as needed.  She takes Pepcid  20 mg twice daily and has discontinued Nexium . Flonase  nasal spray is used once daily for sinus issues, which helps but does not completely resolve her symptoms. She experiences epistaxis from the left nostril, especially after blowing her nose, attributed to dryness and irritation. No significant bleeding, only minor bleeding associated with blowing her nose. She experiences headaches and cold sores.  She is on lisinopril  2.5 mg at bedtime for blood pressure management. She has stopped taking metformin  and is transitioning from Ozempic  to Mounjaro  for diabetes management. She was previously on Mounjaro  but was switched to Ozempic  to assess for intolerance related to her hives. However, she had Hives on Ozempic  too till she was started on Rapsodil. Etiology of hives unknown. She takes Singulair  at bedtime for allergies and uses a Wixela inhaler twice daily.  She recently started Rapsodil 25 mg twice daily for hives, which effectively manages her symptoms. She also takes Zoloft  150 mg daily and Vitamin D  supplements.  She consumes one meal a day and drinks fluids the rest of the time, expressing concern about not losing weight despite these habits. Occasional coughing, possibly related to recent home renovations.       PERTINENT  PMH / PSH: PMhx reviewed  OBJECTIVE:   BP (!) 152/88   Pulse 77   Wt (!) 306 lb 3.2 oz (138.9 kg)   SpO2 98%   BMI 59.80 kg/m   Physical Exam   GEN: Obese, no  distress HEENT: No sinus tenderness. Throat normal. Ears normal, tympanic membranes normal, no discharge. Nostrils clear, no blood. CHEST: Lungs clear to auscultation, no wheezing. CARDIOVASCULAR: Heart regular rate and rhythm, no murmurs.       ASSESSMENT/PLAN:   Assessment & Plan 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) Ozempic  not effective for weight loss. Transitioning to Mounjaro . A1c for diabetes control assessment. - Transition from Ozempic  to Mounjaro  after completing current Ozempic  dose. - Ordered A1c test = 5.6 from 5.9 - Previous A1C of 8 and 7.8 prior to initiation of Mounjaro  and Ozempic  - Discontinued metformin . - Switch Ozempic  1 mg weekly to 5 mg Mounjaro  weekly injection Essential hypertension - Poorly controlled BP on Lisinopril  2.5 mg every day - Increase to 5 mg every day - BP parameters and home monitoring instructions provided - F/U in 1-3 months.  Idiopathic urticaria Chronic urticaria: Idiopathic Rapsodil 25 mg twice daily effective in managing symptoms. - Continue Rapsodil 25 mg twice daily. Chronic rhinitis Acute on Chronic No signs of infection Nasal congestion and epistaxis likely due to allergies. Flonase  provides some relief. Home renovation may contribute. - Continue Flonase  nasal spray once daily. - Use Singulair  at bedtime. - Prescribed Astelin spray. - Monitor for worsening symptoms or brown discharge. Myasthenia gravis (HCC) Managed with Cellcept  500 mg twice daily. - Continue Cellcept  500 mg twice daily. Morbid obesity (HCC) Switch Ozempic  to Mounjaro     General Health Maintenance Due for vaccinations. Discussed importance and availability of flu vaccine for those  under 18. - Administered flu, COVID, and hepatitis B vaccinations. - Encouraged regular health check-ups and screenings.  Otto Fairly, MD [facility]

## 2024-02-02 NOTE — Assessment & Plan Note (Addendum)
-   Poorly controlled BP on Lisinopril  2.5 mg every day - Increase to 5 mg every day - BP parameters and home monitoring instructions provided - F/U in 1-3 months.

## 2024-02-02 NOTE — Patient Instructions (Signed)

## 2024-02-02 NOTE — Assessment & Plan Note (Addendum)
 Ozempic  not effective for weight loss. Transitioning to Mounjaro . A1c for diabetes control assessment. - Transition from Ozempic  to Mounjaro  after completing current Ozempic  dose. - Ordered A1c test = 5.6 from 5.9 - Previous A1C of 8 and 7.8 prior to initiation of Mounjaro  and Ozempic  - Discontinued metformin . - Switch Ozempic  1 mg weekly to 5 mg Mounjaro  weekly injection

## 2024-02-05 ENCOUNTER — Ambulatory Visit: Attending: Family Medicine

## 2024-02-05 ENCOUNTER — Other Ambulatory Visit: Payer: Self-pay

## 2024-02-05 DIAGNOSIS — M79604 Pain in right leg: Secondary | ICD-10-CM | POA: Diagnosis present

## 2024-02-05 DIAGNOSIS — R262 Difficulty in walking, not elsewhere classified: Secondary | ICD-10-CM | POA: Insufficient documentation

## 2024-02-05 DIAGNOSIS — M6281 Muscle weakness (generalized): Secondary | ICD-10-CM | POA: Diagnosis present

## 2024-02-05 DIAGNOSIS — M1612 Unilateral primary osteoarthritis, left hip: Secondary | ICD-10-CM | POA: Insufficient documentation

## 2024-02-05 DIAGNOSIS — M5416 Radiculopathy, lumbar region: Secondary | ICD-10-CM | POA: Diagnosis present

## 2024-02-05 DIAGNOSIS — M25552 Pain in left hip: Secondary | ICD-10-CM | POA: Diagnosis present

## 2024-02-05 NOTE — Therapy (Signed)
 OUTPATIENT PHYSICAL THERAPY EVALUATION   Patient Name: Frances Medina MRN: [id number] DOB:Nov 11, 1960, 63 y.o., female Today's Date: 02/05/2024  END OF SESSION:   Past Medical History:  Diagnosis Date   Acute respiratory failure (HCC)    Acute respiratory failure with hypoxemia (HCC)    Aspiration into airway    Back pain 07/31/2017   Benign neoplasm of rectum    CAP (community acquired pneumonia) 08/05/2015   Depression with anxiety    Diabetes mellitus, new onset (HCC) 06/02/2023   Diverticulosis    Dizziness 03/13/2023   Dysphagia 03/2015   EGD, Dr Avram. mild antral gastritis, ? due to Ibuprofen .  no stricture but empirically maloney dilated esophagus.    Dysphagia, neurologic    E. 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   GERD (gastroesophageal reflux disease)    hiatal hernia   Hypertension    went away when I stopped smoking   Knee injury    Left hip pain 02/18/2017   Migraine    maybe couple times/month (03/20/2015)   Mild intermittent asthma 06/22/2017   Myasthenia gravis (HCC) 2017   Myasthenia gravis with acute exacerbation (HCC) 05/15/2015   Osteoarthritis of left knee 12/02/2013   Osteoarthritis of right knee 08/30/2013   Pancreatitis 07/25/2017   Protein-calorie malnutrition, severe 08/07/2015   Seasonal allergies    takes Zytrec   Shortness of breath    Sinusitis 02/25/2018   Sleep apnea    with cPAp machine   Tracheostomy status (HCC)    Tubular adenoma of colon    Tumors    in my stomach   Umbilical hernia    watching , no plans for surgery at present   Urine incontinence 10/19/2015   Urticaria    VAP (ventilator-associated pneumonia) [facility])     Past Surgical History:  Procedure Laterality Date   BIOPSY  07/05/2019   Procedure: BIOPSY;  Surgeon: Aneita [name], MD;  Location: [facility];  Service: Endoscopy;;   CESAREAN SECTION  1987; 1989   COLONOSCOPY N/A 09/22/2023   Procedure: COLONOSCOPY;  Surgeon: Federico Rosario BROCKS, MD;  Location: [facility];  Service: Gastroenterology;  Laterality: N/A;   COLONOSCOPY WITH PROPOFOL  N/A 09/19/2013   Procedure: COLONOSCOPY WITH PROPOFOL ;  Surgeon: [name] Aneita, MD;  Location: [facility];  Service: Endoscopy;  Laterality: N/A;   COLONOSCOPY WITH PROPOFOL  N/A 07/05/2019   Procedure: COLONOSCOPY WITH PROPOFOL ;  Surgeon: Aneita [name], MD;  Location: [facility];  Service: Endoscopy;  Laterality: N/A;   DILATION AND CURETTAGE OF UTERUS     ESOPHAGOGASTRODUODENOSCOPY N/A 08/10/2015   Procedure: ESOPHAGOGASTRODUODENOSCOPY (EGD);  Surgeon: Lupita FORBES Avram, MD;  Location: [facility] ENDOSCOPY;  Service: Endoscopy;  Laterality: N/A;   ESOPHAGOGASTRODUODENOSCOPY N/A 09/22/2023   Procedure: EGD (ESOPHAGOGASTRODUODENOSCOPY);  Surgeon: Federico Rosario BROCKS, MD;  Location: [facility];  Service: Gastroenterology;  Laterality: N/A;   ESOPHAGOGASTRODUODENOSCOPY (EGD) WITH PROPOFOL  N/A 03/21/2015   Procedure: ESOPHAGOGASTRODUODENOSCOPY (EGD) WITH PROPOFOL ;  Surgeon: Lupita FORBES Avram, MD;  Location: [facility] ENDOSCOPY;  Service: Endoscopy;  Laterality: N/A;   ESOPHAGOGASTRODUODENOSCOPY (EGD) WITH PROPOFOL  N/A 07/05/2019   Procedure: ESOPHAGOGASTRODUODENOSCOPY (EGD) WITH PROPOFOL ;  Surgeon: Aneita [name], MD;  Location: [facility];  Service: Endoscopy;  Laterality:  N/A;   PARTIAL KNEE ARTHROPLASTY Right 08/30/2013   Procedure: RIGHT UNICOMPARTMENTAL KNEE;  Surgeon: Fonda SHAUNNA Olmsted, MD;  Location: [facility] OR;  Service: Orthopedics;  Laterality: Right;   PARTIAL KNEE ARTHROPLASTY Left 12/02/2013   Procedure: LEFT KNEE UNI ARTHROPLASTY;  Surgeon: Fonda SHAUNNA Olmsted, MD;  Location: [facility];  Service: Orthopedics;   Laterality: Left;   PEG PLACEMENT N/A 08/10/2015   Procedure: PERCUTANEOUS ENDOSCOPIC GASTROSTOMY (PEG) PLACEMENT;  Surgeon: Lupita FORBES Commander, MD;  Location: [facility] ENDOSCOPY;  Service: Endoscopy;  Laterality: N/A;   POLYPECTOMY  07/05/2019   Procedure: POLYPECTOMY;  Surgeon: Aneita [name], MD;  Location: [facility];  Service: Endoscopy;;   TUBAL LIGATION  1989   VAGINAL HYSTERECTOMY  1990's?   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) 06/02/2023   Idiopathic urticaria 04/15/2023   Knee pain 03/13/2023   Morbid obesity (HCC) 01/26/2023   Lumbar radiculopathy 12/03/2021   Positive ANA (antinuclear antibody) 08/26/2019   History of colonic polyps    Macromastia 06/17/2019   Seasonal allergies 02/18/2019   GAD (generalized anxiety disorder) 08/06/2018   Iron  deficiency anemia 07/31/2017   Osteoarthritis of left hip 02/18/2017   OSA on CPAP 09/11/2016   Restrictive lung disease 10/11/2015   Major depression    Headache, migraine    Essential hypertension    Splenomegaly 04/07/2015   Myasthenia gravis (HCC)    Osteoarthritis of left knee 12/02/2013   Benign neoplasm of descending colon 09/19/2013   Osteoarthritis of right knee 08/30/2013   GERD (gastroesophageal reflux disease) 06/23/2013   Fibroids 06/23/2013    PCP: Anders Otto DASEN, MD   REFERRING PROVIDER: Anders Otto DASEN, MD   REFERRING DIAG: [phone] (ICD-10-CM) - Primary osteoarthritis of left hip M54.16 (ICD-10-CM) - Lumbar radiculopathy   Rationale for Evaluation and Treatment: Rehabilitation  THERAPY DIAG:  Muscle weakness (generalized)  Radiculopathy, lumbar region  Pain in left hip  Pain in right leg  Difficulty in walking, not elsewhere  classified  ONSET DATE: 01/05/2024 date of referral  SUBJECTIVE:                                                                                                                                                                                           SUBJECTIVE STATEMENT: Patient reports to PT with chronic low back and and L>R  hip pain. She describes having tenderness on L hip. She does have some numbness if she lays on her L side, as well as prolonged standing. She states that she occasionally has cramping in her toes. She has difficulty navigating her stairs at home. When I get to the last step, I have to throw my leg over. She also reports having difficulty with prolonged standing, and has to lean over the shopping cart to prevent numbness in her legs. She does have difficulty sleeping, and is often tossing-and-turning.   PERTINENT HISTORY:  Hx of BIL TKA Other relevant PMHx includes depression, DM, diverticulosis, myasthenia gravis, LE edema, obesity, anxiety  PAIN:  Are you having pain?  Yes: NPRS scale: 8/10, took tylenol  prior to our session Pain location: back, L>R hip  Pain description: cramping, tenderness Aggravating factors: prolonged standing, laying down   Relieving factors: tylenol , sitting after prolonged standing   PRECAUTIONS: None  RED FLAGS: None   WEIGHT BEARING RESTRICTIONS: No  FALLS:  Has patient fallen in last 6 months? Yes. Number of falls often losing her balance or tripping but I catch myself   LIVING ENVIRONMENT: Lives with: lives with their family and lives with their spouse Lives in: House/apartment Stairs: Yes: Internal: 1 flight of steps; on right going up and can reach both and External: 5 steps; can reach both Has following equipment at home: Single point cane  OCCUPATION: n/a   PLOF: Independent with community mobility with device  PATIENT GOALS: Less pain with daily activities, ability to stand and lay down without severe pain.  Ability  to ascend stairs without challenge  NEXT MD VISIT: 03/04/24  OBJECTIVE:  Note: Objective measures were completed at Evaluation unless otherwise noted.  DIAGNOSTIC FINDINGS:  10/09/2023   IMPRESSION: 1. Mild osteoarthritis of the left hip. 2. Right hip joint space is preserved.  PATIENT SURVEYS:  Modified Oswestry:  MODIFIED OSWESTRY DISABILITY SCALE  Date: 02/05/2024  Score  Pain intensity 1 = The pain is bad, but I can manage without having to take pain medication  2. Personal care (washing, dressing, etc.) 1 =  I can take care of myself normally, but it increases my pain.  3. Lifting 3 = Pain prevents me from lifting heavy weights, but I can manage light to medium weights if they are conveniently positioned  4. Walking 1 = Pain prevents me from walking more than 1 mile.  5. Sitting 2 =  Pain prevents me from sitting more than 1 hour.  6. Standing 2 =  Pain prevents me from standing more than 1 hour  7. Sleeping 5 =  Pain prevents me from sleeping at all.  8. Social Life 3 =  Pain prevents me from going out very often.  9. Traveling 1 =  I can travel anywhere, but it increases my pain.  10. Employment/ Homemaking 2 = I can perform most of my homemaking/job duties, but pain prevents me from performing more physically stressful activities (eg, lifting, vacuuming).  Total 21/50   Interpretation of scores: Score Category Description  0-20% Minimal Disability The patient can cope with most living activities. Usually no treatment is indicated apart from advice on lifting, sitting and exercise  21-40% Moderate Disability The patient experiences more pain and difficulty with sitting, lifting and standing. Travel and social life are more difficult and they may be disabled from work. Personal care, sexual activity and sleeping are not grossly affected, and the patient can usually be managed by conservative means  41-60% Severe Disability Pain remains the main problem in this group, but  activities of daily living are affected. These patients require a detailed investigation  61-80% Crippled Back pain impinges on all aspects of the patient's life. Positive intervention is required  81-100% Bed-bound These patients are either bed-bound or exaggerating their symptoms  Bluford FORBES Zoe DELENA Karon DELENA, et al. Surgery versus conservative management of stable thoracolumbar fracture: the PRESTO feasibility RCT. [place] ([place]): [employer]; 2021 Nov. [facility] Technology Assessment, No. 25.62.) Appendix 3, Oswestry Disability Index category descriptors. Available from: Findjewelers.cz  Minimally Clinically Important Difference (MCID) = 12.8%  COGNITION: Overall cognitive status: Within functional limits for tasks assessed     SENSATION: Not tested  MUSCLE LENGTH: deferred  POSTURE: rounded shoulders and forward head  PALPATION: deferred  LUMBAR ROM:   AROM eval  Flexion 75%  Extension 75%  Right lateral flexion 50%  Left lateral flexion 50%  Right rotation 50%  Left rotation 50%   (Blank rows = not tested)  LOWER EXTREMITY ROM:     Active  Right eval Left eval  Hip flexion    Hip extension    Hip abduction    Hip adduction    Hip internal rotation    Hip external rotation    Knee flexion    Knee extension    Ankle dorsiflexion    Ankle plantarflexion    Ankle inversion    Ankle eversion     (Blank rows = not tested)  LOWER EXTREMITY MMT:    MMT Right eval Left eval  Hip flexion 4- 4-  Hip extension    Hip abduction 4- 4-  Hip adduction 3- 3-  Hip internal rotation    Hip external rotation    Knee flexion 5 5  Knee extension 5 5  Ankle dorsiflexion 4+ 4+  Ankle plantarflexion    Ankle inversion    Ankle eversion     (Blank rows = not tested)     TREATMENT DATE:   [facility] Adult PT Treatment:                                                DATE: 02/05/2024   Initial evaluation: see patient education  and home exercise program as noted below                                                                                                                                   PATIENT EDUCATION:  Education details: reviewed initial home exercise program; discussion of POC, prognosis and goals for skilled PT   Person educated: Patient Education method: Explanation, Demonstration, and Handouts Education comprehension: verbalized understanding, returned demonstration, and needs further education  HOME EXERCISE PROGRAM: Access Code: [email or web address] URL: [email or web address] Date: 02/05/2024 Prepared by: Marko Molt  Exercises - Standing Sidebends  - 2 x daily - 7 x weekly - 2 sets - 5-10 reps - 3 sec hold - Seated 3 Way Exercise Ball Roll Out Stretch  - 2 x daily - 7 x weekly - 2 sets - 5-10 reps - 3 sec hold - Abdominal Press into Ball   - 2 x daily - 7 x weekly - 2 sets - 5-10 reps - 3 sec hold  ASSESSMENT:  CLINICAL IMPRESSION: Maguire is a 63 y.o. female who was seen today for physical therapy evaluation and treatment for persistent low back pain and BIL hip pain. She is demonstrating diminished LS AROM with pain at end range of motion, and diminished BIL LE MMT scores. She also demonstrates decreased postural endurance with unsupported sitting. She has related pain and difficulty with prolonged standing, walking, laying down and performance of related ADLs/IADLs. She reported decreased pain severity with repeated lumbar flexion and repeated R side bending, and is encouraged to continue with these directions until f/u visit. She requires skilled PT services at this time to address relevant deficits and improve overall function.     OBJECTIVE IMPAIRMENTS: decreased activity tolerance, decreased ROM, decreased strength, improper body mechanics, postural dysfunction, and pain.   ACTIVITY LIMITATIONS: carrying, lifting, bending, standing, squatting, sleeping, stairs, and locomotion  level  PARTICIPATION LIMITATIONS: meal prep, cleaning, laundry, shopping, and community activity  PERSONAL FACTORS: Past/current experiences, Time since onset of injury/illness/exacerbation, 3+ comorbidities: Other relevant PMHx includes depression, DM, diverticulosis, myasthenia gravis, LE edema, obesity, anxiety, and hx of BIL TKA are also affecting patient's functional outcome.   REHAB POTENTIAL: Fair    CLINICAL DECISION MAKING: Evolving/moderate complexity  EVALUATION COMPLEXITY: Moderate   GOALS: Goals reviewed with patient? YES  SHORT TERM GOALS: Target date: 03/08/2024   Patient will be independent with initial home program at least 3 days/week.  Baseline: provided at eval Goal Status: INITIAL   2.  Patient will demonstrate improved postural awareness for at least 15 minutes while seated without need for cueing from PT.  Baseline: see objective measures Goal Status: INITIAL   3.  Patient will demonstrate improved Lumbar AROM to at least 75-80% of full range Baseline: see objective measures Goal Status: INITIAL      LONG TERM GOALS: Target date: 04/05/2024    Patient will report improved overall functional ability with ODI score of 8/50 or less.  Baseline: 21/50 Goal Status: INITIAL    2.  Patient will demonstrate at least 4+/5 MMT  Baseline: see objective measures  Goal status: INITIAL    3.  Patient will demonstrate ability to perform floor to waist lifting of at least 25# using appropriate body mechanics and with no more than minimal pain in order to safely perform normal daily/occupational tasks.   Baseline: unable to tolerate  Goal Status: INITIAL    4.  Patient will report ability to sleep at least 6 hours/night with minimal pain-related sleep disturbance Baseline: I sleep less than 2 hours d/t pain  Goal status: INITIAL    PLAN:  PT FREQUENCY: 1-2x/week  PT DURATION: 8 weeks  PLANNED INTERVENTIONS: 97164- PT Re-evaluation, 97750- Physical  Performance Testing, 97110-Therapeutic exercises, 97530- Therapeutic activity, [id number]- Neuromuscular re-education, 97535- Self Care, [id number]- Manual therapy, [id number]- Aquatic Therapy, [id number]- Electrical stimulation (unattended), [id number]- Electrical stimulation (manual), 20560 (1-2 muscles), 20561 (3+ muscles)- Dry Needling, Patient/Family education, Joint mobilization, Cryotherapy, and Moist heat.  PLAN FOR NEXT SESSION: address lumbar mobility, LQ strengthening, low-impact aerobic activity, manual therapy and  modalities for pain modulation as indicated; review and update HEP; onging patient education regarding current presentation/prognosis   Marko Molt, PT, DPT  02/09/2024 4:26 PM

## 2024-02-10 ENCOUNTER — Ambulatory Visit

## 2024-02-11 ENCOUNTER — Ambulatory Visit

## 2024-02-11 DIAGNOSIS — M5416 Radiculopathy, lumbar region: Secondary | ICD-10-CM

## 2024-02-11 DIAGNOSIS — M6281 Muscle weakness (generalized): Secondary | ICD-10-CM

## 2024-02-11 DIAGNOSIS — M25552 Pain in left hip: Secondary | ICD-10-CM

## 2024-02-11 DIAGNOSIS — M79604 Pain in right leg: Secondary | ICD-10-CM

## 2024-02-11 DIAGNOSIS — R262 Difficulty in walking, not elsewhere classified: Secondary | ICD-10-CM

## 2024-02-11 NOTE — Therapy (Signed)
 OUTPATIENT PHYSICAL THERAPY TREATMENT   Patient Name: Frances Medina MRN: [id number] DOB:Feb 24, 1961, 63 y.o., female Today's Date: 02/11/2024  END OF SESSION:  PT End of Session - 02/11/24 1053     Visit Number 2    Number of Visits 16    Date for Recertification  04/01/24    Authorization Type MCR/Aetna    PT Start Time 1130    PT Stop Time 1210    PT Time Calculation (min) 40 min    Activity Tolerance Patient tolerated treatment well    Behavior During Therapy [facility] for tasks assessed/performed          Past Medical History:  Diagnosis Date   Acute respiratory failure (HCC)    Acute respiratory failure with hypoxemia (HCC)    Aspiration into airway    Back pain 07/31/2017   Benign neoplasm of rectum    CAP (community acquired pneumonia) 08/05/2015   Depression with anxiety    Diabetes mellitus, new onset (HCC) 06/02/2023   Diverticulosis    Dizziness 03/13/2023   Dysphagia 03/2015   EGD, Dr Avram. mild antral gastritis, ? due to Ibuprofen .  no stricture but empirically maloney dilated esophagus.    Dysphagia, neurologic    E. 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   GERD (gastroesophageal reflux disease)    hiatal hernia   Hypertension    went away when I stopped smoking   Knee injury    Left hip pain 02/18/2017   Migraine    maybe couple times/month (03/20/2015)   Mild intermittent asthma 06/22/2017   Myasthenia gravis (HCC) 2017   Myasthenia gravis with acute exacerbation (HCC) 05/15/2015   Osteoarthritis of left knee 12/02/2013   Osteoarthritis of right knee 08/30/2013   Pancreatitis 07/25/2017   Protein-calorie malnutrition, severe 08/07/2015   Seasonal  allergies    takes Zytrec   Shortness of breath    Sinusitis 02/25/2018   Sleep apnea    with cPAp machine   Tracheostomy status (HCC)    Tubular adenoma of colon    Tumors    in my stomach   Umbilical hernia    watching , no plans for surgery at present   Urine incontinence 10/19/2015   Urticaria    VAP (ventilator-associated pneumonia) [facility])    Past Surgical History:  Procedure Laterality Date   BIOPSY  07/05/2019   Procedure: BIOPSY;  Surgeon: Aneita [name], MD;  Location: [facility];  Service: Endoscopy;;   CESAREAN SECTION  1987; 1989   COLONOSCOPY N/A 09/22/2023   Procedure: COLONOSCOPY;  Surgeon: Federico Rosario BROCKS, MD;  Location: [facility];  Service: Gastroenterology;  Laterality: N/A;   COLONOSCOPY WITH PROPOFOL  N/A 09/19/2013   Procedure: COLONOSCOPY WITH PROPOFOL ;  Surgeon: [name] Aneita, MD;  Location: [facility];  Service: Endoscopy;  Laterality: N/A;   COLONOSCOPY WITH PROPOFOL  N/A 07/05/2019   Procedure: COLONOSCOPY WITH PROPOFOL ;  Surgeon: Aneita [name], MD;  Location: [facility];  Service: Endoscopy;  Laterality: N/A;   DILATION AND CURETTAGE OF UTERUS     ESOPHAGOGASTRODUODENOSCOPY N/A 08/10/2015   Procedure: ESOPHAGOGASTRODUODENOSCOPY (EGD);  Surgeon: Lupita FORBES Avram, MD;  Location: [facility] ENDOSCOPY;  Service: Endoscopy;  Laterality: N/A;  ESOPHAGOGASTRODUODENOSCOPY N/A 09/22/2023   Procedure: EGD (ESOPHAGOGASTRODUODENOSCOPY);  Surgeon: Federico Rosario BROCKS, MD;  Location: [facility];  Service: Gastroenterology;  Laterality: N/A;   ESOPHAGOGASTRODUODENOSCOPY (EGD) WITH PROPOFOL  N/A 03/21/2015   Procedure: ESOPHAGOGASTRODUODENOSCOPY (EGD) WITH PROPOFOL ;  Surgeon: Lupita FORBES Commander, MD;  Location: [facility] ENDOSCOPY;  Service: Endoscopy;  Laterality: N/A;   ESOPHAGOGASTRODUODENOSCOPY (EGD) WITH PROPOFOL  N/A 07/05/2019   Procedure: ESOPHAGOGASTRODUODENOSCOPY (EGD) WITH PROPOFOL ;  Surgeon: Aneita [name], MD;  Location: [facility];  Service: Endoscopy;  Laterality: N/A;    PARTIAL KNEE ARTHROPLASTY Right 08/30/2013   Procedure: RIGHT UNICOMPARTMENTAL KNEE;  Surgeon: Fonda SHAUNNA Olmsted, MD;  Location: [facility] OR;  Service: Orthopedics;  Laterality: Right;   PARTIAL KNEE ARTHROPLASTY Left 12/02/2013   Procedure: LEFT KNEE UNI ARTHROPLASTY;  Surgeon: Fonda SHAUNNA Olmsted, MD;  Location: [facility];  Service: Orthopedics;  Laterality: Left;   PEG PLACEMENT N/A 08/10/2015   Procedure: PERCUTANEOUS ENDOSCOPIC GASTROSTOMY (PEG) PLACEMENT;  Surgeon: Lupita FORBES Commander, MD;  Location: [facility] ENDOSCOPY;  Service: Endoscopy;  Laterality: N/A;   POLYPECTOMY  07/05/2019   Procedure: POLYPECTOMY;  Surgeon: Aneita [name], MD;  Location: [facility];  Service: Endoscopy;;   TUBAL LIGATION  1989   VAGINAL HYSTERECTOMY  1990's?   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) 06/02/2023   Idiopathic urticaria 04/15/2023   Knee pain 03/13/2023   Morbid obesity (HCC) 01/26/2023   Lumbar radiculopathy 12/03/2021   Positive ANA (antinuclear antibody) 08/26/2019   History of colonic polyps    Macromastia 06/17/2019   Seasonal allergies 02/18/2019   GAD (generalized anxiety disorder) 08/06/2018   Iron  deficiency anemia 07/31/2017   Osteoarthritis of left hip 02/18/2017   OSA on CPAP 09/11/2016   Restrictive lung disease 10/11/2015   Major depression    Headache, migraine    Essential hypertension    Splenomegaly 04/07/2015   Myasthenia gravis (HCC)    Osteoarthritis of left knee 12/02/2013   Benign neoplasm of descending colon 09/19/2013   Osteoarthritis of right knee 08/30/2013   GERD (gastroesophageal reflux disease) 06/23/2013   Fibroids 06/23/2013    PCP: Anders Otto DASEN, MD   REFERRING PROVIDER: Anders Otto DASEN, MD   REFERRING DIAG: [phone] (ICD-10-CM) - Primary osteoarthritis of left hip M54.16 (ICD-10-CM) - Lumbar radiculopathy   Rationale for Evaluation and Treatment: Rehabilitation  THERAPY DIAG:  Radiculopathy, lumbar region  Pain in left hip  Pain in right leg  Muscle weakness (generalized)  Difficulty in walking, not elsewhere classified  ONSET DATE: 01/05/2024 date of referral  SUBJECTIVE:  SUBJECTIVE STATEMENT: 02/11/2024  Pt reports numbness in the leg last night which caused poor sleep. She has some low back pain today as well. She reports compliance with HEP.   EVAL Patient reports to PT with chronic low back and and L>R hip pain. She describes having tenderness on L hip. She does have some numbness if she lays on her L side, as well as prolonged standing. She states that she occasionally has cramping in her toes. She has difficulty navigating her stairs at home. When I get to the last step, I have to throw my leg over. She also reports having difficulty with prolonged standing, and has to lean over the shopping cart to prevent numbness in her legs. She does have difficulty sleeping, and is often tossing-and-turning.   PERTINENT HISTORY:  Hx of BIL TKA Other relevant PMHx includes depression, DM, diverticulosis, myasthenia gravis, LE edema, obesity, anxiety  PAIN:  Are you having pain?  Yes: NPRS scale: 8/10, took tylenol  prior to our session Pain location: back, L>R hip  Pain description: cramping, tenderness Aggravating factors: prolonged standing, laying down   Relieving factors: tylenol , sitting after prolonged standing   PRECAUTIONS: None  RED FLAGS: None   WEIGHT BEARING RESTRICTIONS: No  FALLS:  Has patient fallen in last 6 months? Yes. Number of falls often  losing her balance or tripping but I catch myself   LIVING ENVIRONMENT: Lives with: lives with their family and lives with their spouse Lives in: House/apartment Stairs: Yes: Internal: 1 flight of steps; on right going up and can reach both and External: 5 steps; can reach both Has following equipment at home: Single point cane  OCCUPATION: n/a   PLOF: Independent with community mobility with device  PATIENT GOALS: Less pain with daily activities, ability to stand and lay down without severe pain.  Ability to ascend stairs without challenge  NEXT MD VISIT: 03/04/24  OBJECTIVE:  Note: Objective measures were completed at Evaluation unless otherwise noted.  DIAGNOSTIC FINDINGS:  10/09/2023   IMPRESSION: 1. Mild osteoarthritis of the left hip. 2. Right hip joint space is preserved.  PATIENT SURVEYS:  Modified Oswestry:  MODIFIED OSWESTRY DISABILITY SCALE  Date: 02/05/2024  Score  Pain intensity 1 = The pain is bad, but I can manage without having to take pain medication  2. Personal care (washing, dressing, etc.) 1 =  I can take care of myself normally, but it increases my pain.  3. Lifting 3 = Pain prevents me from lifting heavy weights, but I can manage light to medium weights if they are conveniently positioned  4. Walking 1 = Pain prevents me from walking more than 1 mile.  5. Sitting 2 =  Pain prevents me from sitting more than 1 hour.  6. Standing 2 =  Pain prevents me from standing more than 1 hour  7. Sleeping 5 =  Pain prevents me from sleeping at all.  8. Social Life 3 =  Pain prevents me from going out very often.  9. Traveling 1 =  I can travel anywhere, but it increases my pain.  10. Employment/ Homemaking 2 = I can perform most of my homemaking/job duties, but pain prevents me from performing more physically stressful activities (eg, lifting, vacuuming).  Total 21/50   Interpretation of scores: Score Category Description  0-20% Minimal Disability The patient can  cope with most living activities. Usually no treatment is indicated apart from advice on lifting, sitting and exercise  21-40% Moderate Disability The patient  experiences more pain and difficulty with sitting, lifting and standing. Travel and social life are more difficult and they may be disabled from work. Personal care, sexual activity and sleeping are not grossly affected, and the patient can usually be managed by conservative means  41-60% Severe Disability Pain remains the main problem in this group, but activities of daily living are affected. These patients require a detailed investigation  61-80% Crippled Back pain impinges on all aspects of the patients life. Positive intervention is required  81-100% Bed-bound These patients are either bed-bound or exaggerating their symptoms  Bluford FORBES Zoe DELENA Karon DELENA, et al. Surgery versus conservative management of stable thoracolumbar fracture: the PRESTO feasibility RCT. [place] ([place]): [employer]; 2021 Nov. [facility] Technology Assessment, No. 25.62.) Appendix 3, Oswestry Disability Index category descriptors. Available from: Findjewelers.cz  Minimally Clinically Important Difference (MCID) = 12.8%  COGNITION: Overall cognitive status: Within functional limits for tasks assessed     SENSATION: Not tested  MUSCLE LENGTH: deferred  POSTURE: rounded shoulders and forward head  PALPATION: deferred  LUMBAR ROM:   AROM eval  Flexion 75%  Extension 75%  Right lateral flexion 50%  Left lateral flexion 50%  Right rotation 50%  Left rotation 50%   (Blank rows = not tested)  LOWER EXTREMITY ROM:     Active  Right eval Left eval  Hip flexion    Hip extension    Hip abduction    Hip adduction    Hip internal rotation    Hip external rotation    Knee flexion    Knee extension    Ankle dorsiflexion    Ankle plantarflexion    Ankle inversion    Ankle eversion     (Blank rows = not  tested)  LOWER EXTREMITY MMT:    MMT Right eval Left eval  Hip flexion 4- 4-  Hip extension    Hip abduction 4- 4-  Hip adduction 3- 3-  Hip internal rotation    Hip external rotation    Knee flexion 5 5  Knee extension 5 5  Ankle dorsiflexion 4+ 4+  Ankle plantarflexion    Ankle inversion    Ankle eversion     (Blank rows = not tested)     TREATMENT DATE:   [facility] Adult PT Treatment:                                                DATE: 02/11/2024 Neuro Re-Ed: Hooklying TrA x 15  Hooklying PPT and TrA x 20  Hooklying pelvic tilting x 10   Hooklying pelvic clocks x 10  Hooklying SLR (very small ROM) x 6 B Hooklying hip ADD iso 15x5 hold Seated glute iso 10x5 hold Seated hip hinge 2x10 STS x 10  NuStep level 3, LE only, x 8 mins   [facility] Adult PT Treatment:                                                DATE: 02/05/2024   Initial evaluation: see patient education and home exercise program as noted below  PATIENT EDUCATION:  Education details: reviewed initial home exercise program; discussion of POC, prognosis and goals for skilled PT   Person educated: Patient Education method: Explanation, Demonstration, and Handouts Education comprehension: verbalized understanding, returned demonstration, and needs further education  HOME EXERCISE PROGRAM: Access Code: [email or web address] URL: [email or web address] Date: 02/05/2024 Prepared by: Marko Molt  Exercises - Standing Sidebends  - 2 x daily - 7 x weekly - 2 sets - 5-10 reps - 3 sec hold - Seated 3 Way Exercise Ball Roll Out Stretch  - 2 x daily - 7 x weekly - 2 sets - 5-10 reps - 3 sec hold - Abdominal Press into [place]   - 2 x daily - 7 x weekly - 2 sets - 5-10 reps - 3 sec hold  Access Code: [email or web address] URL: [email or web address] Date: 02/11/2024 Prepared by:  Marijo Berber  Exercises - Supine Pelvic Clock  - 1 x daily - 7 x weekly - 3 sets - 10 reps - Supine Transversus Abdominis Bracing - Hands on Stomach  - 1 x daily - 7 x weekly - 3 sets - 10 reps - Small Range Straight Leg Raise  - 1 x daily - 7 x weekly - 3 sets - 10 reps - Seated Gluteal Sets  - 1 x daily - 7 x weekly - 3 sets - 10 reps - Seated Hip Flexion Toward Target  - 1 x daily - 7 x weekly - 3 sets - 10 reps  ASSESSMENT:  CLINICAL IMPRESSION: 02/11/2024  Pt introduced to core stability and gluteal strengthening with positive response. Pt reporting decreased pain and no altered sensation by end of session. She needed some cues to appropriately engage the core. The pt will benefit from skilled physical therapy to decrease pain and increase function.    EVAL Frances Medina is a 63 y.o. female who was seen today for physical therapy evaluation and treatment for persistent low back pain and BIL hip pain. She is demonstrating diminished LS AROM with pain at end range of motion, and diminished BIL LE MMT scores. She also demonstrates decreased postural endurance with unsupported sitting. She has related pain and difficulty with prolonged standing, walking, laying down and performance of related ADLs/IADLs. She reported decreased pain severity with repeated lumbar flexion and repeated R side bending, and is encouraged to continue with these directions until f/u visit. She requires skilled PT services at this time to address relevant deficits and improve overall function.     OBJECTIVE IMPAIRMENTS: decreased activity tolerance, decreased ROM, decreased strength, improper body mechanics, postural dysfunction, and pain.   ACTIVITY LIMITATIONS: carrying, lifting, bending, standing, squatting, sleeping, stairs, and locomotion level  PARTICIPATION LIMITATIONS: meal prep, cleaning, laundry, shopping, and community activity  PERSONAL FACTORS: Past/current experiences, Time since onset of  injury/illness/exacerbation, 3+ comorbidities: Other relevant PMHx includes depression, DM, diverticulosis, myasthenia gravis, LE edema, obesity, anxiety, and hx of BIL TKA are also affecting patient's functional outcome.   REHAB POTENTIAL: Fair    CLINICAL DECISION MAKING: Evolving/moderate complexity  EVALUATION COMPLEXITY: Moderate   GOALS: Goals reviewed with patient? YES  SHORT TERM GOALS: Target date: 03/08/2024   Patient will be independent with initial home program at least 3 days/week.  Baseline: provided at eval Goal Status: INITIAL   2.  Patient will demonstrate improved postural awareness for at least 15 minutes while seated without need for cueing from PT.  Baseline: see objective measures Goal Status: INITIAL   3.  Patient will demonstrate improved Lumbar AROM to at least 75-80%  of full range Baseline: see objective measures Goal Status: INITIAL      LONG TERM GOALS: Target date: 04/05/2024    Patient will report improved overall functional ability with ODI score of 8/50 or less.  Baseline: 21/50 Goal Status: INITIAL    2.  Patient will demonstrate at least 4+/5 MMT  Baseline: see objective measures  Goal status: INITIAL    3.  Patient will demonstrate ability to perform floor to waist lifting of at least 25# using appropriate body mechanics and with no more than minimal pain in order to safely perform normal daily/occupational tasks.   Baseline: unable to tolerate  Goal Status: INITIAL    4.  Patient will report ability to sleep at least 6 hours/night with minimal pain-related sleep disturbance Baseline: I sleep less than 2 hours d/t pain  Goal status: INITIAL    PLAN:  PT FREQUENCY: 1-2x/week  PT DURATION: 8 weeks  PLANNED INTERVENTIONS: 97164- PT Re-evaluation, 97750- Physical Performance Testing, 97110-Therapeutic exercises, 97530- Therapeutic activity, [id number]- Neuromuscular re-education, 97535- Self Care, [id number]- Manual therapy, [id number]-  Aquatic Therapy, [id number]- Electrical stimulation (unattended), [id number]- Electrical stimulation (manual), 20560 (1-2 muscles), 20561 (3+ muscles)- Dry Needling, Patient/Family education, Joint mobilization, Cryotherapy, and Moist heat.  PLAN FOR NEXT SESSION: address lumbar mobility, LQ strengthening, low-impact aerobic activity, manual therapy and modalities for pain modulation as indicated; review and update HEP; onging patient education regarding current presentation/prognosis   Marijo Berber PT, DPT 02/11/2024 12:08 PM

## 2024-02-16 ENCOUNTER — Other Ambulatory Visit (HOSPITAL_COMMUNITY): Payer: Self-pay

## 2024-02-16 MED FILL — Sertraline HCl Tab 50 MG: ORAL | 90 days supply | Qty: 270 | Fill #0 | Status: AC

## 2024-02-17 ENCOUNTER — Ambulatory Visit

## 2024-02-17 DIAGNOSIS — M6281 Muscle weakness (generalized): Secondary | ICD-10-CM | POA: Diagnosis not present

## 2024-02-17 DIAGNOSIS — M5416 Radiculopathy, lumbar region: Secondary | ICD-10-CM

## 2024-02-17 NOTE — Therapy (Signed)
 OUTPATIENT PHYSICAL THERAPY TREATMENT   Patient Name: Frances Medina MRN: [id number] DOB:05/13/1960, 63 y.o., female Today's Date: 02/17/2024  END OF SESSION:  PT End of Session - 02/17/24 1015     Visit Number 3    Number of Visits 16    Date for Recertification  04/01/24    Authorization Type MCR/Aetna    PT Start Time 1006    PT Stop Time 1044    PT Time Calculation (min) 38 min    Activity Tolerance Patient tolerated treatment well    Behavior During Therapy [facility] for tasks assessed/performed           Past Medical History:  Diagnosis Date   Acute respiratory failure (HCC)    Acute respiratory failure with hypoxemia (HCC)    Aspiration into airway    Back pain 07/31/2017   Benign neoplasm of rectum    CAP (community acquired pneumonia) 08/05/2015   Depression with anxiety    Diabetes mellitus, new onset (HCC) 06/02/2023   Diverticulosis    Dizziness 03/13/2023   Dysphagia 03/2015   EGD, Dr Avram. mild antral gastritis, ? due to Ibuprofen .  no stricture but empirically maloney dilated esophagus.    Dysphagia, neurologic    E. 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   GERD (gastroesophageal reflux disease)    hiatal hernia   Hypertension    went away when I stopped smoking   Knee injury    Left hip pain 02/18/2017   Migraine    maybe couple times/month (03/20/2015)   Mild intermittent asthma 06/22/2017   Myasthenia gravis (HCC) 2017   Myasthenia gravis with acute exacerbation (HCC) 05/15/2015   Osteoarthritis of left knee 12/02/2013   Osteoarthritis of right knee 08/30/2013   Pancreatitis 07/25/2017   Protein-calorie malnutrition, severe 08/07/2015   Seasonal  allergies    takes Zytrec   Shortness of breath    Sinusitis 02/25/2018   Sleep apnea    with cPAp machine   Tracheostomy status (HCC)    Tubular adenoma of colon    Tumors    in my stomach   Umbilical hernia    watching , no plans for surgery at present   Urine incontinence 10/19/2015   Urticaria    VAP (ventilator-associated pneumonia) [facility])    Past Surgical History:  Procedure Laterality Date   BIOPSY  07/05/2019   Procedure: BIOPSY;  Surgeon: Aneita [name], MD;  Location: [facility];  Service: Endoscopy;;   CESAREAN SECTION  1987; 1989   COLONOSCOPY N/A 09/22/2023   Procedure: COLONOSCOPY;  Surgeon: Federico Rosario BROCKS, MD;  Location: [facility];  Service: Gastroenterology;  Laterality: N/A;   COLONOSCOPY WITH PROPOFOL  N/A 09/19/2013   Procedure: COLONOSCOPY WITH PROPOFOL ;  Surgeon: [name] Aneita, MD;  Location: [facility];  Service: Endoscopy;  Laterality: N/A;   COLONOSCOPY WITH PROPOFOL  N/A 07/05/2019   Procedure: COLONOSCOPY WITH PROPOFOL ;  Surgeon: Aneita [name], MD;  Location: [facility];  Service: Endoscopy;  Laterality: N/A;   DILATION AND CURETTAGE OF UTERUS     ESOPHAGOGASTRODUODENOSCOPY N/A 08/10/2015   Procedure: ESOPHAGOGASTRODUODENOSCOPY (EGD);  Surgeon: Lupita FORBES Avram, MD;  Location[facility]COPY;  Service: Endoscopy;  Laterality: N/A;  ESOPHAGOGASTRODUODENOSCOPY N/A 09/22/2023   Procedure: EGD (ESOPHAGOGASTRODUODENOSCOPY);  Surgeon: Federico Rosario BROCKS, MD;  Location: [facility];  Service: Gastroenterology;  Laterality: N/A;   ESOPHAGOGASTRODUODENOSCOPY (EGD) WITH PROPOFOL  N/A 03/21/2015   Procedure: ESOPHAGOGASTRODUODENOSCOPY (EGD) WITH PROPOFOL ;  Surgeon: Lupita FORBES Commander, MD;  Location: [facility] ENDOSCOPY;  Service: Endoscopy;  Laterality: N/A;   ESOPHAGOGASTRODUODENOSCOPY (EGD) WITH PROPOFOL  N/A 07/05/2019   Procedure: ESOPHAGOGASTRODUODENOSCOPY (EGD) WITH PROPOFOL ;  Surgeon: Aneita [name], MD;  Location: [facility];  Service: Endoscopy;  Laterality: N/A;    PARTIAL KNEE ARTHROPLASTY Right 08/30/2013   Procedure: RIGHT UNICOMPARTMENTAL KNEE;  Surgeon: Fonda SHAUNNA Olmsted, MD;  Location: [facility] OR;  Service: Orthopedics;  Laterality: Right;   PARTIAL KNEE ARTHROPLASTY Left 12/02/2013   Procedure: LEFT KNEE UNI ARTHROPLASTY;  Surgeon: Fonda SHAUNNA Olmsted, MD;  Location: [facility];  Service: Orthopedics;  Laterality: Left;   PEG PLACEMENT N/A 08/10/2015   Procedure: PERCUTANEOUS ENDOSCOPIC GASTROSTOMY (PEG) PLACEMENT;  Surgeon: Lupita FORBES Commander, MD;  Location: [facility] ENDOSCOPY;  Service: Endoscopy;  Laterality: N/A;   POLYPECTOMY  07/05/2019   Procedure: POLYPECTOMY;  Surgeon: Aneita [name], MD;  Location: [facility];  Service: Endoscopy;;   TUBAL LIGATION  1989   VAGINAL HYSTERECTOMY  1990's?   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) 06/02/2023   Idiopathic urticaria 04/15/2023   Knee pain 03/13/2023   Morbid obesity (HCC) 01/26/2023   Lumbar radiculopathy 12/03/2021   Positive ANA (antinuclear antibody) 08/26/2019   History of colonic polyps    Macromastia 06/17/2019   Seasonal allergies 02/18/2019   GAD (generalized anxiety disorder) 08/06/2018   Iron  deficiency anemia 07/31/2017   Osteoarthritis of left hip 02/18/2017   OSA on CPAP 09/11/2016   Restrictive lung disease 10/11/2015   Major depression    Headache, migraine    Essential hypertension    Splenomegaly 04/07/2015   Myasthenia gravis (HCC)    Osteoarthritis of left knee 12/02/2013   Benign neoplasm of descending colon 09/19/2013   Osteoarthritis of right knee 08/30/2013   GERD (gastroesophageal reflux disease) 06/23/2013   Fibroids 06/23/2013    PCP: Anders Otto DASEN, MD   REFERRING PROVIDER: Anders Otto DASEN, MD   REFERRING DIAG: [phone] (ICD-10-CM) - Primary osteoarthritis of left hip M54.16 (ICD-10-CM) - Lumbar radiculopathy   Rationale for Evaluation and Treatment: Rehabilitation  THERAPY DIAG:  Radiculopathy, lumbar region  ONSET DATE: 01/05/2024 date of referral  SUBJECTIVE:  SUBJECTIVE STATEMENT: 02/17/2024 Patient reports that she felt good relief after last visit.  EVAL Patient reports to PT with chronic low back and and L>R hip pain. She describes having tenderness on L hip. She does have some numbness if she lays on her L side, as well as prolonged standing. She states that she occasionally has cramping in her toes. She has difficulty navigating her stairs at home. When I get to the last step, I have to throw my leg over. She also reports having difficulty with prolonged standing, and has to lean over the shopping cart to prevent numbness in her legs. She does have difficulty sleeping, and is often tossing-and-turning.   PERTINENT HISTORY:  Hx of BIL TKA Other relevant PMHx includes depression, DM, diverticulosis, myasthenia gravis, LE edema, obesity, anxiety  PAIN:  Are you having pain?  Yes: NPRS scale: 8/10, took tylenol  prior to our session Pain location: back, L>R hip  Pain description: cramping, tenderness Aggravating factors: prolonged standing, laying down   Relieving factors: tylenol , sitting after prolonged standing   PRECAUTIONS: None  RED FLAGS: None   WEIGHT BEARING RESTRICTIONS: No  FALLS:  Has patient fallen in last 6 months? Yes. Number of falls often losing her balance or tripping but I catch myself   LIVING ENVIRONMENT: Lives with: lives with their family and lives with their spouse Lives in: House/apartment Stairs: Yes: Internal: 1 flight of steps; on  right going up and can reach both and External: 5 steps; can reach both Has following equipment at home: Single point cane  OCCUPATION: n/a   PLOF: Independent with community mobility with device  PATIENT GOALS: Less pain with daily activities, ability to stand and lay down without severe pain.  Ability to ascend stairs without challenge  NEXT MD VISIT: 03/04/24  OBJECTIVE:  Note: Objective measures were completed at Evaluation unless otherwise noted.  DIAGNOSTIC FINDINGS:  10/09/2023   IMPRESSION: 1. Mild osteoarthritis of the left hip. 2. Right hip joint space is preserved.  PATIENT SURVEYS:  Modified Oswestry:  MODIFIED OSWESTRY DISABILITY SCALE  Date: 02/05/2024  Score  Pain intensity 1 = The pain is bad, but I can manage without having to take pain medication  2. Personal care (washing, dressing, etc.) 1 =  I can take care of myself normally, but it increases my pain.  3. Lifting 3 = Pain prevents me from lifting heavy weights, but I can manage light to medium weights if they are conveniently positioned  4. Walking 1 = Pain prevents me from walking more than 1 mile.  5. Sitting 2 =  Pain prevents me from sitting more than 1 hour.  6. Standing 2 =  Pain prevents me from standing more than 1 hour  7. Sleeping 5 =  Pain prevents me from sleeping at all.  8. Social Life 3 =  Pain prevents me from going out very often.  9. Traveling 1 =  I can travel anywhere, but it increases my pain.  10. Employment/ Homemaking 2 = I can perform most of my homemaking/job duties, but pain prevents me from performing more physically stressful activities (eg, lifting, vacuuming).  Total 21/50   Interpretation of scores: Score Category Description  0-20% Minimal Disability The patient can cope with most living activities. Usually no treatment is indicated apart from advice on lifting, sitting and exercise  21-40% Moderate Disability The patient experiences more pain and difficulty with sitting,  lifting and standing. Travel and social life are more difficult and  they may be disabled from work. Personal care, sexual activity and sleeping are not grossly affected, and the patient can usually be managed by conservative means  41-60% Severe Disability Pain remains the main problem in this group, but activities of daily living are affected. These patients require a detailed investigation  61-80% Crippled Back pain impinges on all aspects of the patients life. Positive intervention is required  81-100% Bed-bound These patients are either bed-bound or exaggerating their symptoms  Bluford FORBES Zoe DELENA Karon DELENA, et al. Surgery versus conservative management of stable thoracolumbar fracture: the PRESTO feasibility RCT. [place] ([place]): [employer]; 2021 Nov. [facility] [employer] Assessment, No. 25.62.) Appendix 3, Oswestry Disability Index category descriptors. Available from: Findjewelers.cz  Minimally Clinically Important Difference (MCID) = 12.8%  COGNITION: Overall cognitive status: Within functional limits for tasks assessed     SENSATION: Not tested  MUSCLE LENGTH: deferred  POSTURE: rounded shoulders and forward head  PALPATION: deferred  LUMBAR ROM:   AROM eval  Flexion 75%  Extension 75%  Right lateral flexion 50%  Left lateral flexion 50%  Right rotation 50%  Left rotation 50%   (Blank rows = not tested)  LOWER EXTREMITY ROM:     Active  Right eval Left eval  Hip flexion    Hip extension    Hip abduction    Hip adduction    Hip internal rotation    Hip external rotation    Knee flexion    Knee extension    Ankle dorsiflexion    Ankle plantarflexion    Ankle inversion    Ankle eversion     (Blank rows = not tested)  LOWER EXTREMITY MMT:    MMT Right eval Left eval  Hip flexion 4- 4-  Hip extension    Hip abduction 4- 4-  Hip adduction 3- 3-  Hip internal rotation    Hip external rotation    Knee flexion 5  5  Knee extension 5 5  Ankle dorsiflexion 4+ 4+  Ankle plantarflexion    Ankle inversion    Ankle eversion     (Blank rows = not tested)     TREATMENT DATE:    [facility] Adult PT Treatment:                                                DATE: 02/17/2024 Neuro Re-Ed: NuStep level 3, LE only, x 8 mins  Hooklying pelvic tilting 2x10   Hooklying PPT and TrA x 20  Hooklying pelvic clocks 2x 10  Hooklying SLR (very small ROM) x 5 B Hooklying hip ADD iso 20x5 hold ball squeeze Seated glute iso 2 x 10x5  Seated hip hinge 2x10 STS 2 x 10  Seated ankle DF blue TB x 20 each  Seated ankle eversion blue TB x 20 each   Self-care:  Discussed importance of drinking more water , esp. Related to mm cramps   [facility] Adult PT Treatment:                                                DATE: 02/11/2024 Neuro Re-Ed: Hooklying TrA x 15  Hooklying PPT and TrA x 20  Hooklying pelvic tilting x 10   Hooklying pelvic  clocks x 10  Hooklying SLR (very small ROM) x 6 B Hooklying hip ADD iso 15x5 hold Seated glute iso 10x5 hold Seated hip hinge 2x10 STS x 10  NuStep level 3, LE only, x 8 mins   [facility] Adult PT Treatment:                                                DATE: 02/05/2024   Initial evaluation: see patient education and home exercise program as noted below                                                                                                                                   PATIENT EDUCATION:  Education details: reviewed initial home exercise program; discussion of POC, prognosis and goals for skilled PT   Person educated: Patient Education method: Explanation, Demonstration, and Handouts Education comprehension: verbalized understanding, returned demonstration, and needs further education  HOME EXERCISE PROGRAM: Access Code: [email or web address] URL: [email or web address] Date: 02/05/2024 Prepared by: Marko Molt  Exercises - Standing Sidebends  - 2 x daily - 7 x  weekly - 2 sets - 5-10 reps - 3 sec hold - Seated 3 Way Exercise Ball Roll Out Stretch  - 2 x daily - 7 x weekly - 2 sets - 5-10 reps - 3 sec hold - Abdominal Press into [place]   - 2 x daily - 7 x weekly - 2 sets - 5-10 reps - 3 sec hold  Access Code: [email or web address] URL: [email or web address] Date: 02/11/2024 Prepared by: Marijo Berber  Exercises - Supine Pelvic Clock  - 1 x daily - 7 x weekly - 3 sets - 10 reps - Supine Transversus Abdominis Bracing - Hands on Stomach  - 1 x daily - 7 x weekly - 3 sets - 10 reps - Small Range Straight Leg Raise  - 1 x daily - 7 x weekly - 3 sets - 10 reps - Seated Gluteal Sets  - 1 x daily - 7 x weekly - 3 sets - 10 reps - Seated Hip Flexion Toward Target  - 1 x daily - 7 x weekly - 3 sets - 10 reps  ASSESSMENT:  CLINICAL IMPRESSION: 02/17/2024 Patient continues to respond well to current POC. She was able to slightly increased volume today. Minimal cueing necessary today. She will benefit from further progressoin of core stabilization exercises. Added ankle strengthening activity at end of session without exacerbation of symptoms.   EVAL Cyp[place] is a 63 y.o. female who was seen today for physical therapy evaluation and treatment for persistent low back pain and BIL hip pain. She is demonstrating diminished LS AROM with pain at end range of motion, and diminished BIL LE MMT scores. She also demonstrates decreased postural endurance with unsupported sitting. She has related pain and difficulty with  prolonged standing, walking, laying down and performance of related ADLs/IADLs. She reported decreased pain severity with repeated lumbar flexion and repeated R side bending, and is encouraged to continue with these directions until f/u visit. She requires skilled PT services at this time to address relevant deficits and improve overall function.     OBJECTIVE IMPAIRMENTS: decreased activity tolerance, decreased ROM, decreased strength, improper body  mechanics, postural dysfunction, and pain.   ACTIVITY LIMITATIONS: carrying, lifting, bending, standing, squatting, sleeping, stairs, and locomotion level  PARTICIPATION LIMITATIONS: meal prep, cleaning, laundry, shopping, and community activity  PERSONAL FACTORS: Past/current experiences, Time since onset of injury/illness/exacerbation, 3+ comorbidities: Other relevant PMHx includes depression, DM, diverticulosis, myasthenia gravis, LE edema, obesity, anxiety, and hx of BIL TKA are also affecting patient's functional outcome.   REHAB POTENTIAL: Fair    CLINICAL DECISION MAKING: Evolving/moderate complexity  EVALUATION COMPLEXITY: Moderate   GOALS: Goals reviewed with patient? YES  SHORT TERM GOALS: Target date: 03/08/2024   Patient will be independent with initial home program at least 3 days/week.  Baseline: provided at eval Goal Status: INITIAL   2.  Patient will demonstrate improved postural awareness for at least 15 minutes while seated without need for cueing from PT.  Baseline: see objective measures Goal Status: INITIAL   3.  Patient will demonstrate improved Lumbar AROM to at least 75-80% of full range Baseline: see objective measures Goal Status: INITIAL      LONG TERM GOALS: Target date: 04/05/2024    Patient will report improved overall functional ability with ODI score of 8/50 or less.  Baseline: 21/50 Goal Status: INITIAL    2.  Patient will demonstrate at least 4+/5 MMT  Baseline: see objective measures  Goal status: INITIAL    3.  Patient will demonstrate ability to perform floor to waist lifting of at least 25# using appropriate body mechanics and with no more than minimal pain in order to safely perform normal daily/occupational tasks.   Baseline: unable to tolerate  Goal Status: INITIAL    4.  Patient will report ability to sleep at least 6 hours/night with minimal pain-related sleep disturbance Baseline: I sleep less than 2 hours d/t pain   Goal status: INITIAL    PLAN:  PT FREQUENCY: 1-2x/week  PT DURATION: 8 weeks  PLANNED INTERVENTIONS: 97164- PT Re-evaluation, 97750- Physical Performance Testing, 97110-Therapeutic exercises, 97530- Therapeutic activity, [id number]- Neuromuscular re-education, 97535- Self Care, [id number]- Manual therapy, [id number]- Aquatic Therapy, H9716- Electrical stimulation (unattended), [id number]- Electrical stimulation (manual), 20560 (1-2 muscles), 20561 (3+ muscles)- Dry Needling, Patient/Family education, Joint mobilization, Cryotherapy, and Moist heat.  PLAN FOR NEXT SESSION: address lumbar mobility, LQ strengthening, low-impact aerobic activity, manual therapy and modalities for pain modulation as indicated; review and update HEP; onging patient education regarding current presentation/prognosis   Marko Molt, PT, DPT  02/17/2024 1:14 PM

## 2024-02-22 ENCOUNTER — Telehealth (HOSPITAL_COMMUNITY): Payer: Self-pay

## 2024-02-22 ENCOUNTER — Other Ambulatory Visit: Payer: Self-pay | Admitting: Family Medicine

## 2024-02-22 ENCOUNTER — Other Ambulatory Visit (HOSPITAL_COMMUNITY): Payer: Self-pay

## 2024-02-22 MED ORDER — TIRZEPATIDE 7.5 MG/0.5ML ~~LOC~~ SOAJ
7.5000 mg | SUBCUTANEOUS | 0 refills | Status: DC
  Filled 2024-02-22 – 2024-02-23 (×2): qty 2, 28d supply, fill #0

## 2024-02-22 MED ORDER — SERTRALINE HCL 50 MG PO TABS
150.0000 mg | ORAL_TABLET | Freq: Every day | ORAL | 1 refills | Status: AC
  Filled 2024-02-22: qty 270, 90d supply, fill #0

## 2024-02-23 ENCOUNTER — Ambulatory Visit

## 2024-02-23 ENCOUNTER — Other Ambulatory Visit (HOSPITAL_COMMUNITY): Payer: Self-pay

## 2024-02-23 DIAGNOSIS — M5416 Radiculopathy, lumbar region: Secondary | ICD-10-CM

## 2024-02-23 DIAGNOSIS — M6281 Muscle weakness (generalized): Secondary | ICD-10-CM | POA: Diagnosis not present

## 2024-02-23 NOTE — Therapy (Signed)
 " OUTPATIENT PHYSICAL THERAPY TREATMENT   Patient Name: Frances Medina MRN: [id number] DOB:1960/09/02, 63 y.o., female Today's Date: 02/23/2024  END OF SESSION:  PT End of Session - 02/23/24 1047     Visit Number 4    Number of Visits 16    Date for Recertification  04/01/24    Authorization Type MCR/Aetna    PT Start Time 1049    PT Stop Time 1127    PT Time Calculation (min) 38 min    Activity Tolerance Patient tolerated treatment well    Behavior During Therapy [facility] for tasks assessed/performed            Past Medical History:  Diagnosis Date   Acute respiratory failure (HCC)    Acute respiratory failure with hypoxemia (HCC)    Aspiration into airway    Back pain 07/31/2017   Benign neoplasm of rectum    CAP (community acquired pneumonia) 08/05/2015   Depression with anxiety    Diabetes mellitus, new onset (HCC) 06/02/2023   Diverticulosis    Dizziness 03/13/2023   Dysphagia 03/2015   EGD, Dr Avram. mild antral gastritis, ? due to Ibuprofen .  no stricture but empirically maloney dilated esophagus.    Dysphagia, neurologic    E. 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   GERD (gastroesophageal reflux disease)    hiatal hernia   Hypertension    went away when I stopped smoking   Knee injury    Left hip pain 02/18/2017   Migraine    maybe couple times/month (03/20/2015)   Mild intermittent asthma 06/22/2017   Myasthenia gravis (HCC) 2017   Myasthenia gravis with acute exacerbation (HCC) 05/15/2015   Osteoarthritis of left knee 12/02/2013   Osteoarthritis of right knee 08/30/2013   Pancreatitis 07/25/2017   Protein-calorie malnutrition, severe 08/07/2015   Seasonal  allergies    takes Zytrec   Shortness of breath    Sinusitis 02/25/2018   Sleep apnea    with cPAp machine   Tracheostomy status (HCC)    Tubular adenoma of colon    Tumors    in my stomach   Umbilical hernia    watching , no plans for surgery at present   Urine incontinence 10/19/2015   Urticaria    VAP (ventilator-associated pneumonia) [facility])    Past Surgical History:  Procedure Laterality Date   BIOPSY  07/05/2019   Procedure: BIOPSY;  Surgeon: Aneita [name], MD;  Location: [facility] ENDOSCOPY;  Service: Endoscopy;;   CESAREAN SECTION  1987; 1989   COLONOSCOPY N/A 09/22/2023   Procedure: COLONOSCOPY;  Surgeon: Federico Rosario BROCKS, MD;  Location: [facility]OS[facility];  Service: Gastroenterology;  Laterality: N/A;   COLONOSCOPY WITH PROPOFOL  N/A 09/19/2013   Procedure: COLONOSCOPY WITH PROPOFOL ;  Surgeon: [name] Aneita, MD;  Location: [facility];  Service: Endoscopy;  Laterality: N/A;   COLONOSCOPY WITH PROPOFOL  N/A 07/05/2019   Procedure: COLONOSCOPY WITH PROPOFOL ;  Surgeon: Aneita [name], MD;  Location: [facility];  Service: Endoscopy;  Laterality: N/A;   DILATION AND CURETTAGE OF UTERUS     ESOPHAGOGASTRODUODENOSCOPY N/A 08/10/2015   Procedure: ESOPHAGOGASTRODUODENOSCOPY (EGD);  Surgeon: Lupita FORBES Avram, MD;  Location: [facility] ENDOSCOPY;  Service: Endoscopy;  Laterality:  N/A;   ESOPHAGOGASTRODUODENOSCOPY N/A 09/22/2023   Procedure: EGD (ESOPHAGOGASTRODUODENOSCOPY);  Surgeon: Federico Rosario BROCKS, MD;  Location: [facility];  Service: Gastroenterology;  Laterality: N/A;   ESOPHAGOGASTRODUODENOSCOPY (EGD) WITH PROPOFOL  N/A 03/21/2015   Procedure: ESOPHAGOGASTRODUODENOSCOPY (EGD) WITH PROPOFOL ;  Surgeon: Lupita FORBES Commander, MD;  Location: [facility] ENDOSCOPY;  Service: Endoscopy;  Laterality: N/A;   ESOPHAGOGASTRODUODENOSCOPY (EGD) WITH PROPOFOL  N/A 07/05/2019   Procedure: ESOPHAGOGASTRODUODENOSCOPY (EGD) WITH PROPOFOL ;  Surgeon: Aneita [name], MD;  Location: [facility];  Service: Endoscopy;  Laterality: N/A;    PARTIAL KNEE ARTHROPLASTY Right 08/30/2013   Procedure: RIGHT UNICOMPARTMENTAL KNEE;  Surgeon: Fonda SHAUNNA Olmsted, MD;  Location: [facility] OR;  Service: Orthopedics;  Laterality: Right;   PARTIAL KNEE ARTHROPLASTY Left 12/02/2013   Procedure: LEFT KNEE UNI ARTHROPLASTY;  Surgeon: Fonda SHAUNNA Olmsted, MD;  Location: [facility];  Service: Orthopedics;  Laterality: Left;   PEG PLACEMENT N/A 08/10/2015   Procedure: PERCUTANEOUS ENDOSCOPIC GASTROSTOMY (PEG) PLACEMENT;  Surgeon: Lupita FORBES Commander, MD;  Location[facility]COPY;  Service: Endoscopy;  Laterality: N/A;   POLYPECTOMY  07/05/2019   Procedure: POLYPECTOMY;  Surgeon: Aneita [name], MD;  Location: [facility] ENDOSCOPY;  Service: Endoscopy;;   TUBAL LIGATION  1989   VAGINAL HYSTERECTOMY  1990's?   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) 06/02/2023   Idiopathic urticaria 04/15/2023   Knee pain 03/13/2023   Morbid obesity (HCC) 01/26/2023   Lumbar radiculopathy 12/03/2021   Positive ANA (antinuclear antibody) 08/26/2019   History of colonic polyps    Macromastia 06/17/2019   Seasonal allergies 02/18/2019   GAD (generalized anxiety disorder) 08/06/2018   Iron  deficiency anemia 07/31/2017   Osteoarthritis of left hip 02/18/2017   OSA on CPAP 09/11/2016   Restrictive lung disease 10/11/2015   Major depression    Headache, migraine    Essential hypertension    Splenomegaly 04/07/2015   Myasthenia gravis (HCC)    Osteoarthritis of left knee 12/02/2013   Benign neoplasm of descending colon 09/19/2013   Osteoarthritis of right knee 08/30/2013   GERD (gastroesophageal reflux disease) 06/23/2013   Fibroids 06/23/2013    PCP: Anders Otto DASEN, MD   REFERRING PROVIDER: Anders Otto DASEN, MD   REFERRING DIAG: [phone] (ICD-10-CM) - Primary osteoarthritis of left hip M54.16 (ICD-10-CM) - Lumbar radiculopathy   Rationale for Evaluation and Treatment: Rehabilitation  THERAPY DIAG:  Radiculopathy, lumbar region  ONSET DATE: 01/05/2024 date of referral  SUBJECTIVE:  SUBJECTIVE STATEMENT: 02/23/2024 Patient reports to PT with significant R groin pain. She states that she is still not sleeping well d/t pain and GERD symptoms.   EVAL Patient reports to PT with chronic low back and and L>R hip pain. She describes having tenderness on L hip. She does have some numbness if she lays on her L side, as well as prolonged standing. She states that she occasionally has cramping in her toes. She has difficulty navigating her stairs at home. When I get to the last step, I have to throw my leg over. She also reports having difficulty with prolonged standing, and has to lean over the shopping cart to prevent numbness in her legs. She does have difficulty sleeping, and is often tossing-and-turning.   PERTINENT HISTORY:  Hx of BIL TKA Other relevant PMHx includes depression, DM, diverticulosis, myasthenia gravis, LE edema, obesity, anxiety  PAIN:  Are you having pain?  Yes: NPRS scale: 8/10, took tylenol  prior to our session Pain location: back, L>R hip  Pain description: cramping, tenderness Aggravating factors: prolonged standing, laying down   Relieving factors: tylenol , sitting after prolonged standing   PRECAUTIONS: None  RED FLAGS: None   WEIGHT BEARING RESTRICTIONS: No  FALLS:  Has patient fallen in last 6 months? Yes. Number of falls often losing her balance or tripping but I catch myself   LIVING ENVIRONMENT: Lives with: lives with their family and lives with their  spouse Lives in: House/apartment Stairs: Yes: Internal: 1 flight of steps; on right going up and can reach both and External: 5 steps; can reach both Has following equipment at home: Single point cane  OCCUPATION: n/a   PLOF: Independent with community mobility with device  PATIENT GOALS: Less pain with daily activities, ability to stand and lay down without severe pain.  Ability to ascend stairs without challenge  NEXT MD VISIT: 03/04/24  OBJECTIVE:  Note: Objective measures were completed at Evaluation unless otherwise noted.  DIAGNOSTIC FINDINGS:  10/09/2023   IMPRESSION: 1. Mild osteoarthritis of the left hip. 2. Right hip joint space is preserved.  PATIENT SURVEYS:  Modified Oswestry:  MODIFIED OSWESTRY DISABILITY SCALE  Date: 02/05/2024  Score  Pain intensity 1 = The pain is bad, but I can manage without having to take pain medication  2. Personal care (washing, dressing, etc.) 1 =  I can take care of myself normally, but it increases my pain.  3. Lifting 3 = Pain prevents me from lifting heavy weights, but I can manage light to medium weights if they are conveniently positioned  4. Walking 1 = Pain prevents me from walking more than 1 mile.  5. Sitting 2 =  Pain prevents me from sitting more than 1 hour.  6. Standing 2 =  Pain prevents me from standing more than 1 hour  7. Sleeping 5 =  Pain prevents me from sleeping at all.  8. Social Life 3 =  Pain prevents me from going out very often.  9. Traveling 1 =  I can travel anywhere, but it increases my pain.  10. Employment/ Homemaking 2 = I can perform most of my homemaking/job duties, but pain prevents me from performing more physically stressful activities (eg, lifting, vacuuming).  Total 21/50   Interpretation of scores: Score Category Description  0-20% Minimal Disability The patient can cope with most living activities. Usually no treatment is indicated apart from advice on lifting, sitting and exercise  21-40%  Moderate Disability The patient experiences more pain and  difficulty with sitting, lifting and standing. Travel and social life are more difficult and they may be disabled from work. Personal care, sexual activity and sleeping are not grossly affected, and the patient can usually be managed by conservative means  41-60% Severe Disability Pain remains the main problem in this group, but activities of daily living are affected. These patients require a detailed investigation  61-80% Crippled Back pain impinges on all aspects of the patients life. Positive intervention is required  81-100% Bed-bound These patients are either bed-bound or exaggerating their symptoms  Bluford FORBES Zoe DELENA Karon DELENA, et al. Surgery versus conservative management of stable thoracolumbar fracture: the PRESTO feasibility RCT. [place] ([place]): [employer]; 2021 Nov. [facility] Technology Assessment, No. 25.62.) Appendix 3, Oswestry Disability Index category descriptors. Available from: Findjewelers.cz  Minimally Clinically Important Difference (MCID) = 12.8%  COGNITION: Overall cognitive status: Within functional limits for tasks assessed     SENSATION: Not tested  MUSCLE LENGTH: deferred  POSTURE: rounded shoulders and forward head  PALPATION: deferred  LUMBAR ROM:   AROM eval  Flexion 75%  Extension 75%  Right lateral flexion 50%  Left lateral flexion 50%  Right rotation 50%  Left rotation 50%   (Blank rows = not tested)  LOWER EXTREMITY ROM:     Active  Right eval Left eval  Hip flexion    Hip extension    Hip abduction    Hip adduction    Hip internal rotation    Hip external rotation    Knee flexion    Knee extension    Ankle dorsiflexion    Ankle plantarflexion    Ankle inversion    Ankle eversion     (Blank rows = not tested)  LOWER EXTREMITY MMT:    MMT Right eval Left eval  Hip flexion 4- 4-  Hip extension    Hip abduction 4- 4-  Hip  adduction 3- 3-  Hip internal rotation    Hip external rotation    Knee flexion 5 5  Knee extension 5 5  Ankle dorsiflexion 4+ 4+  Ankle plantarflexion    Ankle inversion    Ankle eversion     (Blank rows = not tested)     TREATMENT DATE:   [facility] Adult PT Treatment:                                                DATE: 02/23/2024 Neuro Re-Ed: NuStep level 4, LE only, x 8 mins  Hooklying pelvic clocks x 10  Seated hip hinge x20  Seated marches with RTB Seated hip ER with RTB  STS 2 x 10  STS with sumo stance 2 x 10    [facility] Adult PT Treatment:                                                DATE: 02/17/2024 Neuro Re-Ed: NuStep level 3, LE only, x 8 mins  Hooklying pelvic tilting 2x10   Hooklying PPT and TrA x 20  Hooklying pelvic clocks 2x 10  Hooklying SLR (very small ROM) x 5 B Hooklying hip ADD iso 20x5 hold ball squeeze Seated glute iso 2 x 10x5  Seated hip hinge 2x10 STS 2  x 10  Seated ankle DF blue TB x 20 each  Seated ankle eversion blue TB x 20 each   Self-care:  Discussed importance of drinking more water , esp. Related to mm cramps   [facility] Adult PT Treatment:                                                DATE: 02/11/2024 Neuro Re-Ed: Hooklying TrA x 15  Hooklying PPT and TrA x 20  Hooklying pelvic tilting x 10   Hooklying pelvic clocks x 10  Hooklying SLR (very small ROM) x 6 B Hooklying hip ADD iso 15x5 hold Seated glute iso 10x5 hold Seated hip hinge 2x10 STS x 10  NuStep level 3, LE only, x 8 mins   [facility] Adult PT Treatment:                                                DATE: 02/05/2024   Initial evaluation: see patient education and home exercise program as noted below                                                                                                                                   PATIENT EDUCATION:  Education details: reviewed initial home exercise program; discussion of POC, prognosis and goals for skilled PT   Person  educated: Patient Education method: Explanation, Demonstration, and Handouts Education comprehension: verbalized understanding, returned demonstration, and needs further education  HOME EXERCISE PROGRAM: Access Code: [email or web address] URL: [email or web address] Date: 02/18/2024 Prepared by: Marko Molt  Exercises - Supine Pelvic Clock  - 1 x daily - 7 x weekly - 3 sets - 10 reps - Supine Transversus Abdominis Bracing - Hands on Stomach  - 1 x daily - 7 x weekly - 3 sets - 10 reps - Small Range Straight Leg Raise  - 1 x daily - 7 x weekly - 3 sets - 10 reps - Seated Gluteal Sets  - 1 x daily - 7 x weekly - 3 sets - 10 reps - Seated Hip Flexion Toward Target  - 1 x daily - 7 x weekly - 3 sets - 10 reps - Standing Sidebends  - 1 x daily - 7 x weekly - 2 sets - 5-10 reps - 3 sec hold - Seated 3 Way Exercise Ball Roll Out Stretch  - 1 x daily - 7 x weekly - 2 sets - 5-10 reps - 3 sec hold - Abdominal Press into [place]  - 1 x daily - 7 x weekly - 2 sets - 5-10 reps - 3 sec hold  ASSESSMENT:  CLINICAL IMPRESSION: 02/23/2024 Patient reports to PT with significant R  groin pain that is limited participation in normal exercise program. She was able to tolerate progression of seated exercises however. Increased ER position and strengthening was able to partially improve her pain by end of session. We will continue to skilled PT as tolerated  EVAL Frances Medina is a 63 y.o. female who was seen today for physical therapy evaluation and treatment for persistent low back pain and BIL hip pain. She is demonstrating diminished LS AROM with pain at end range of motion, and diminished BIL LE MMT scores. She also demonstrates decreased postural endurance with unsupported sitting. She has related pain and difficulty with prolonged standing, walking, laying down and performance of related ADLs/IADLs. She reported decreased pain severity with repeated lumbar flexion and repeated R side bending, and is encouraged to  continue with these directions until f/u visit. She requires skilled PT services at this time to address relevant deficits and improve overall function.     OBJECTIVE IMPAIRMENTS: decreased activity tolerance, decreased ROM, decreased strength, improper body mechanics, postural dysfunction, and pain.   ACTIVITY LIMITATIONS: carrying, lifting, bending, standing, squatting, sleeping, stairs, and locomotion level  PARTICIPATION LIMITATIONS: meal prep, cleaning, laundry, shopping, and community activity  PERSONAL FACTORS: Past/current experiences, Time since onset of injury/illness/exacerbation, 3+ comorbidities: Other relevant PMHx includes depression, DM, diverticulosis, myasthenia gravis, LE edema, obesity, anxiety, and hx of BIL TKA are also affecting patient's functional outcome.   REHAB POTENTIAL: Fair    CLINICAL DECISION MAKING: Evolving/moderate complexity  EVALUATION COMPLEXITY: Moderate   GOALS: Goals reviewed with patient? YES  SHORT TERM GOALS: Target date: 03/08/2024   Patient will be independent with initial home program at least 3 days/week.  Baseline: provided at eval Goal Status: INITIAL   2.  Patient will demonstrate improved postural awareness for at least 15 minutes while seated without need for cueing from PT.  Baseline: see objective measures Goal Status: INITIAL   3.  Patient will demonstrate improved Lumbar AROM to at least 75-80% of full range Baseline: see objective measures Goal Status: INITIAL      LONG TERM GOALS: Target date: 04/05/2024    Patient will report improved overall functional ability with ODI score of 8/50 or less.  Baseline: 21/50 Goal Status: INITIAL    2.  Patient will demonstrate at least 4+/5 MMT  Baseline: see objective measures  Goal status: INITIAL    3.  Patient will demonstrate ability to perform floor to waist lifting of at least 25# using appropriate body mechanics and with no more than minimal pain in order to safely  perform normal daily/occupational tasks.   Baseline: unable to tolerate  Goal Status: INITIAL    4.  Patient will report ability to sleep at least 6 hours/night with minimal pain-related sleep disturbance Baseline: I sleep less than 2 hours d/t pain  Goal status: INITIAL    PLAN:  PT FREQUENCY: 1-2x/week  PT DURATION: 8 weeks  PLANNED INTERVENTIONS: 97164- PT Re-evaluation, 97750- Physical Performance Testing, 97110-Therapeutic exercises, 97530- Therapeutic activity, [id number]- Neuromuscular re-education, 97535- Self Care, [id number]- Manual therapy, [id number]- Aquatic Therapy, H9716- Electrical stimulation (unattended), [id number]- Electrical stimulation (manual), 20560 (1-2 muscles), 20561 (3+ muscles)- Dry Needling, Patient/Family education, Joint mobilization, Cryotherapy, and Moist heat.  PLAN FOR NEXT SESSION: address lumbar mobility, LQ strengthening, low-impact aerobic activity, manual therapy and modalities for pain modulation as indicated; review and update HEP; onging patient education regarding current presentation/prognosis   Marko Molt, PT, DPT  02/23/2024 1:09 PM    "

## 2024-02-24 ENCOUNTER — Telehealth (HOSPITAL_COMMUNITY): Payer: Self-pay

## 2024-02-24 ENCOUNTER — Other Ambulatory Visit (HOSPITAL_COMMUNITY): Payer: Self-pay

## 2024-02-24 NOTE — Telephone Encounter (Signed)
 PA request has been Received. New Encounter has been or will be created for follow up. For additional info see Pharmacy Prior Auth telephone encounter from 02/24/24.

## 2024-02-24 NOTE — Telephone Encounter (Signed)
 Pharmacy Patient Advocate Encounter   Received notification from Pt Calls Messages that prior authorization for Mounjaro  7.5 mg/0.5 ml auto injectors is required/requested.   Insurance verification completed.   The patient is insured through [facility].   Per test claim: This is a dosage override, called insurance on 02/23/24, they had to submit a ticket to get the override. We are waiting on a determination.

## 2024-02-26 ENCOUNTER — Ambulatory Visit

## 2024-02-26 DIAGNOSIS — M6281 Muscle weakness (generalized): Secondary | ICD-10-CM | POA: Diagnosis not present

## 2024-02-26 DIAGNOSIS — M5416 Radiculopathy, lumbar region: Secondary | ICD-10-CM

## 2024-02-26 NOTE — Therapy (Signed)
 " OUTPATIENT PHYSICAL THERAPY TREATMENT   Patient Name: Frances Medina MRN: [id number] DOB:[birth year], 63 y.o., female Today's Date: 02/26/2024  END OF SESSION:  PT End of Session - 02/26/24 1047     Visit Number 5    Number of Visits 16    Date for Recertification  04/01/24    Authorization Type MCR/Aetna    PT Start Time 1047    PT Stop Time 1125    PT Time Calculation (min) 38 min    Activity Tolerance Patient tolerated treatment well    Behavior During Therapy [facility] for tasks assessed/performed             Past Medical History:  Diagnosis Date   Acute respiratory failure (HCC)    Acute respiratory failure with hypoxemia (HCC)    Aspiration into airway    Back pain 07/31/2017   Benign neoplasm of rectum    CAP (community acquired pneumonia) 08/05/2015   Depression with anxiety    Diabetes mellitus, new onset (HCC) 06/02/2023   Diverticulosis    Dizziness 03/13/2023   Dysphagia 03/2015   EGD, Dr Avram. mild antral gastritis, ? due to Ibuprofen .  no stricture but empirically maloney dilated esophagus.    Dysphagia, neurologic    E. 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   GERD (gastroesophageal reflux disease)    hiatal hernia   Hypertension    went away when I stopped smoking   Knee injury    Left hip pain 02/18/2017   Migraine    maybe couple times/month (03/20/2015)   Mild intermittent asthma 06/22/2017   Myasthenia gravis (HCC) 2017   Myasthenia gravis with acute exacerbation (HCC) 05/15/2015   Osteoarthritis of left knee 12/02/2013   Osteoarthritis of right knee 08/30/2013   Pancreatitis 07/25/2017   Protein-calorie malnutrition, severe 08/07/2015    Seasonal allergies    takes Zytrec   Shortness of breath    Sinusitis 02/25/2018   Sleep apnea    with cPAp machine   Tracheostomy status (HCC)    Tubular adenoma of colon    Tumors    in my stomach   Umbilical hernia    watching , no plans for surgery at present   Urine incontinence 10/19/2015   Urticaria    VAP (ventilator-associated pneumonia) [facility])    Past Surgical History:  Procedure Laterality Date   BIOPSY  07/05/2019   Procedure: BIOPSY;  Surgeon: Aneita [name], MD;  Location: [facility] ENDOSCOPY;  Service: Endoscopy;;   CESAREAN SECTION  1987; 1989   COLONOSCOPY N/A 09/22/2023   Procedure: COLONOSCOPY;  Surgeon: Federico Rosario BROCKS, MD;  Location: [facility];  Service: Gastroenterology;  Laterality: N/A;   COLONOSCOPY WITH PROPOFOL  N/A 09/19/2013   Procedure: COLONOSCOPY WITH PROPOFOL ;  Surgeon: [name] Aneita, MD;  Location: [facility];  Service: Endoscopy;  Laterality: N/A;   COLONOSCOPY WITH PROPOFOL  N/A 07/05/2019   Procedure: COLONOSCOPY WITH PROPOFOL ;  Surgeon: Aneita [name], MD;  Location: [facility];  Service: Endoscopy;  Laterality: N/A;   DILATION AND CURETTAGE OF UTERUS     ESOPHAGOGASTRODUODENOSCOPY N/A 08/10/2015   Procedure: ESOPHAGOGASTRODUODENOSCOPY (EGD);  Surgeon: Lupita FORBES Avram, MD;  Location: [facility] ENDOSCOPY;  Service: Endoscopy;  Laterality: N/A;   ESOPHAGOGASTRODUODENOSCOPY N/A 09/22/2023   Procedure: EGD (ESOPHAGOGASTRODUODENOSCOPY);  Surgeon: Federico Rosario BROCKS, MD;  Location: [facility];  Service: Gastroenterology;  Laterality: N/A;   ESOPHAGOGASTRODUODENOSCOPY (EGD) WITH PROPOFOL  N/A 03/21/2015   Procedure: ESOPHAGOGASTRODUODENOSCOPY (EGD) WITH PROPOFOL ;  Surgeon: Lupita FORBES Commander, MD;  Location: [facility] ENDOSCOPY;  Service: Endoscopy;  Laterality: N/A;   ESOPHAGOGASTRODUODENOSCOPY (EGD) WITH PROPOFOL  N/A 07/05/2019   Procedure: ESOPHAGOGASTRODUODENOSCOPY (EGD) WITH PROPOFOL ;  Surgeon: Aneita [name], MD;  Location: [facility];  Service: Endoscopy;  Laterality:  N/A;   PARTIAL KNEE ARTHROPLASTY Right 08/30/2013   Procedure: RIGHT UNICOMPARTMENTAL KNEE;  Surgeon: Fonda SHAUNNA Olmsted, MD;  Location: [facility] OR;  Service: Orthopedics;  Laterality: Right;   PARTIAL KNEE ARTHROPLASTY Left 12/02/2013   Procedure: LEFT KNEE UNI ARTHROPLASTY;  Surgeon: Fonda SHAUNNA Olmsted, MD;  Location: [facility];  Service: Orthopedics;  Laterality: Left;   PEG PLACEMENT N/A 08/10/2015   Procedure: PERCUTANEOUS ENDOSCOPIC GASTROSTOMY (PEG) PLACEMENT;  Surgeon: Lupita FORBES Commander, MD;  Location: [facility] ENDOSCOPY;  Service: Endoscopy;  Laterality: N/A;   POLYPECTOMY  07/05/2019   Procedure: POLYPECTOMY;  Surgeon: Aneita [name], MD;  Location: [facility];  Service: Endoscopy;;   TUBAL LIGATION  1989   VAGINAL HYSTERECTOMY  1990's?   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) 06/02/2023   Idiopathic urticaria 04/15/2023   Knee pain 03/13/2023   Morbid obesity (HCC) 01/26/2023   Lumbar radiculopathy 12/03/2021   Positive ANA (antinuclear antibody) 08/26/2019   History of colonic polyps    Macromastia 06/17/2019   Seasonal allergies 02/18/2019   GAD (generalized anxiety disorder) 08/06/2018   Iron  deficiency anemia 07/31/2017   Osteoarthritis of left hip 02/18/2017   OSA on CPAP 09/11/2016   Restrictive lung disease 10/11/2015   Major depression    Headache, migraine    Essential hypertension    Splenomegaly 04/07/2015   Myasthenia gravis (HCC)    Osteoarthritis of left knee 12/02/2013   Benign neoplasm of descending colon 09/19/2013   Osteoarthritis of right knee 08/30/2013   GERD (gastroesophageal reflux disease) 06/23/2013   Fibroids 06/23/2013    PCP: Anders Otto DASEN, MD   REFERRING PROVIDER:  Anders Otto DASEN, MD   REFERRING DIAG: [phone] (ICD-10-CM) - Primary osteoarthritis of left hip M54.16 (ICD-10-CM) - Lumbar radiculopathy   Rationale for Evaluation and Treatment: Rehabilitation  THERAPY DIAG:  Radiculopathy, lumbar region  ONSET DATE: 01/05/2024 date of referral  SUBJECTIVE:  SUBJECTIVE STATEMENT: 02/26/2024 Patient reports that she is doing a little better than last visit. She would like to try the nustep at end of today's session.   EVAL Patient reports to PT with chronic low back and and L>R hip pain. She describes having tenderness on L hip. She does have some numbness if she lays on her L side, as well as prolonged standing. She states that she occasionally has cramping in her toes. She has difficulty navigating her stairs at home. When I get to the last step, I have to throw my leg over. She also reports having difficulty with prolonged standing, and has to lean over the shopping cart to prevent numbness in her legs. She does have difficulty sleeping, and is often tossing-and-turning.   PERTINENT HISTORY:  Hx of BIL TKA Other relevant PMHx includes depression, DM, diverticulosis, myasthenia gravis, LE edema, obesity, anxiety  PAIN:  Are you having pain?  Yes: NPRS scale: 8/10, took tylenol  prior to our session Pain location: back, L>R hip  Pain description: cramping, tenderness Aggravating factors: prolonged standing, laying down   Relieving factors: tylenol , sitting after prolonged standing   PRECAUTIONS: None  RED FLAGS: None   WEIGHT BEARING RESTRICTIONS: No  FALLS:  Has patient fallen in last 6 months? Yes. Number of falls often losing her balance or tripping but I catch myself   LIVING ENVIRONMENT: Lives with: lives with their family and lives with their  spouse Lives in: House/apartment Stairs: Yes: Internal: 1 flight of steps; on right going up and can reach both and External: 5 steps; can reach both Has following equipment at home: Single point cane  OCCUPATION: n/a   PLOF: Independent with community mobility with device  PATIENT GOALS: Less pain with daily activities, ability to stand and lay down without severe pain.  Ability to ascend stairs without challenge  NEXT MD VISIT: 03/04/24  OBJECTIVE:  Note: Objective measures were completed at Evaluation unless otherwise noted.  DIAGNOSTIC FINDINGS:  10/09/2023   IMPRESSION: 1. Mild osteoarthritis of the left hip. 2. Right hip joint space is preserved.  PATIENT SURVEYS:  Modified Oswestry:  MODIFIED OSWESTRY DISABILITY SCALE  Date: 02/05/2024  Score  Pain intensity 1 = The pain is bad, but I can manage without having to take pain medication  2. Personal care (washing, dressing, etc.) 1 =  I can take care of myself normally, but it increases my pain.  3. Lifting 3 = Pain prevents me from lifting heavy weights, but I can manage light to medium weights if they are conveniently positioned  4. Walking 1 = Pain prevents me from walking more than 1 mile.  5. Sitting 2 =  Pain prevents me from sitting more than 1 hour.  6. Standing 2 =  Pain prevents me from standing more than 1 hour  7. Sleeping 5 =  Pain prevents me from sleeping at all.  8. Social Life 3 =  Pain prevents me from going out very often.  9. Traveling 1 =  I can travel anywhere, but it increases my pain.  10. Employment/ Homemaking 2 = I can perform most of my homemaking/job duties, but pain prevents me from performing more physically stressful activities (eg, lifting, vacuuming).  Total 21/50   Interpretation of scores: Score Category Description  0-20% Minimal Disability The patient can cope with most living activities. Usually no treatment is indicated apart from advice on lifting, sitting and exercise  21-40%  Moderate Disability The patient experiences more pain  and difficulty with sitting, lifting and standing. Travel and social life are more difficult and they may be disabled from work. Personal care, sexual activity and sleeping are not grossly affected, and the patient can usually be managed by conservative means  41-60% Severe Disability Pain remains the main problem in this group, but activities of daily living are affected. These patients require a detailed investigation  61-80% Crippled Back pain impinges on all aspects of the patients life. Positive intervention is required  81-100% Bed-bound These patients are either bed-bound or exaggerating their symptoms  Bluford FORBES Zoe DELENA Karon DELENA, et al. Surgery versus conservative management of stable thoracolumbar fracture: the PRESTO feasibility RCT. [place] ([place]): [employer]; 2021 Nov. [facility] Technology Assessment, No. 25.62.) Appendix 3, Oswestry Disability Index category descriptors. Available from: Findjewelers.cz  Minimally Clinically Important Difference (MCID) = 12.8%  COGNITION: Overall cognitive status: Within functional limits for tasks assessed     SENSATION: Not tested  MUSCLE LENGTH: deferred  POSTURE: rounded shoulders and forward head  PALPATION: deferred  LUMBAR ROM:   AROM eval  Flexion 75%  Extension 75%  Right lateral flexion 50%  Left lateral flexion 50%  Right rotation 50%  Left rotation 50%   (Blank rows = not tested)  LOWER EXTREMITY ROM:     Active  Right eval Left eval  Hip flexion    Hip extension    Hip abduction    Hip adduction    Hip internal rotation    Hip external rotation    Knee flexion    Knee extension    Ankle dorsiflexion    Ankle plantarflexion    Ankle inversion    Ankle eversion     (Blank rows = not tested)  LOWER EXTREMITY MMT:    MMT Right eval Left eval  Hip flexion 4- 4-  Hip extension    Hip abduction 4- 4-  Hip  adduction 3- 3-  Hip internal rotation    Hip external rotation    Knee flexion 5 5  Knee extension 5 5  Ankle dorsiflexion 4+ 4+  Ankle plantarflexion    Ankle inversion    Ankle eversion     (Blank rows = not tested)     TREATMENT DATE:   [facility] Adult PT Treatment:                                                DATE: 02/26/2024 Therapeutic exercise  NuStep level 4, LE only, x 8 mins  Seated ball squeeze hip adduction 2 x 10  Seated hip ER with RTB 2 x 10 Seated hip hinge 2x10 with 10# KB Seated marches with RTB 2 x 15 STS with sumo stance 2 x 10  PATIENT EDUCATION:  Education details: reviewed initial home exercise program; discussion of POC, prognosis and goals for skilled PT   Person educated: Patient Education method: Explanation, Demonstration, and Handouts Education comprehension: verbalized understanding, returned demonstration, and needs further education  HOME EXERCISE PROGRAM: Access Code: [email or web address] URL: [email or web address] Date: 02/18/2024 Prepared by: Marko Molt  Exercises - Supine Pelvic Clock  - 1 x daily - 7 x weekly - 3 sets - 10 reps - Supine Transversus Abdominis Bracing - Hands on Stomach  - 1 x daily - 7 x weekly - 3 sets - 10 reps - Small Range Straight Leg Raise  - 1 x daily - 7 x weekly - 3 sets - 10 reps - Seated Gluteal Sets  - 1 x daily - 7 x weekly - 3 sets - 10 reps - Seated Hip Flexion Toward Target  - 1 x daily - 7 x weekly - 3 sets - 10 reps - Standing Sidebends  - 1 x daily - 7 x weekly - 2 sets - 5-10 reps - 3 sec hold - Seated 3 Way Exercise Ball Roll Out Stretch  - 1 x daily - 7 x weekly - 2 sets - 5-10 reps - 3 sec hold - Abdominal Press into [place]  - 1 x daily - 7 x weekly - 2 sets - 5-10 reps - 3 sec hold  ASSESSMENT:  CLINICAL IMPRESSION: 02/26/2024  Focused on seated activities  today d/t poor tolerance of supine position. She had improved tolerance of LE strengthening program today. We were able to increase volume today. We will continue to progress as tolerated.   EVAL Ranika is a 63 y.o. female who was seen today for physical therapy evaluation and treatment for persistent low back pain and BIL hip pain. She is demonstrating diminished LS AROM with pain at end range of motion, and diminished BIL LE MMT scores. She also demonstrates decreased postural endurance with unsupported sitting. She has related pain and difficulty with prolonged standing, walking, laying down and performance of related ADLs/IADLs. She reported decreased pain severity with repeated lumbar flexion and repeated R side bending, and is encouraged to continue with these directions until f/u visit. She requires skilled PT services at this time to address relevant deficits and improve overall function.     OBJECTIVE IMPAIRMENTS: decreased activity tolerance, decreased ROM, decreased strength, improper body mechanics, postural dysfunction, and pain.   ACTIVITY LIMITATIONS: carrying, lifting, bending, standing, squatting, sleeping, stairs, and locomotion level  PARTICIPATION LIMITATIONS: meal prep, cleaning, laundry, shopping, and community activity  PERSONAL FACTORS: Past/current experiences, Time since onset of injury/illness/exacerbation, 3+ comorbidities: Other relevant PMHx includes depression, DM, diverticulosis, myasthenia gravis, LE edema, obesity, anxiety, and hx of BIL TKA are also affecting patient's functional outcome.   REHAB POTENTIAL: Fair    CLINICAL DECISION MAKING: Evolving/moderate complexity  EVALUATION COMPLEXITY: Moderate   GOALS: Goals reviewed with patient? YES  SHORT TERM GOALS: Target date: 03/08/2024   Patient will be independent with initial home program at least 3 days/week.  Baseline: provided at eval Goal Status: INITIAL   2.  Patient will demonstrate improved  postural awareness for at least 15 minutes while seated without need for cueing from PT.  Baseline: see objective measures Goal Status: INITIAL   3.  Patient will demonstrate improved Lumbar AROM to at least 75-80% of full range Baseline: see objective measures Goal Status: INITIAL      LONG TERM GOALS: Target date: 04/05/2024    Patient will report improved  overall functional ability with ODI score of 8/50 or less.  Baseline: 21/50 Goal Status: INITIAL    2.  Patient will demonstrate at least 4+/5 MMT  Baseline: see objective measures  Goal status: INITIAL    3.  Patient will demonstrate ability to perform floor to waist lifting of at least 25# using appropriate body mechanics and with no more than minimal pain in order to safely perform normal daily/occupational tasks.   Baseline: unable to tolerate  Goal Status: INITIAL    4.  Patient will report ability to sleep at least 6 hours/night with minimal pain-related sleep disturbance Baseline: I sleep less than 2 hours d/t pain  Goal status: INITIAL    PLAN:  PT FREQUENCY: 1-2x/week  PT DURATION: 8 weeks  PLANNED INTERVENTIONS: 97164- PT Re-evaluation, 97750- Physical Performance Testing, 97110-Therapeutic exercises, 97530- Therapeutic activity, [id number]- Neuromuscular re-education, 97535- Self Care, [id number]- Manual therapy, [id number]- Aquatic Therapy, [id number]- Electrical stimulation (unattended), [id number]- Electrical stimulation (manual), 20560 (1-2 muscles), 20561 (3+ muscles)- Dry Needling, Patient/Family education, Joint mobilization, Cryotherapy, and Moist heat.  PLAN FOR NEXT SESSION: address lumbar mobility, LQ strengthening, low-impact aerobic activity, manual therapy and modalities for pain modulation as indicated; review and update HEP; onging patient education regarding current presentation/prognosis   Marko Molt, PT, DPT  02/26/2024 12:56 PM     "

## 2024-02-29 ENCOUNTER — Other Ambulatory Visit (HOSPITAL_COMMUNITY): Payer: Self-pay

## 2024-02-29 ENCOUNTER — Ambulatory Visit

## 2024-02-29 DIAGNOSIS — M79604 Pain in right leg: Secondary | ICD-10-CM

## 2024-02-29 DIAGNOSIS — M5416 Radiculopathy, lumbar region: Secondary | ICD-10-CM

## 2024-02-29 DIAGNOSIS — M25552 Pain in left hip: Secondary | ICD-10-CM

## 2024-02-29 DIAGNOSIS — M6281 Muscle weakness (generalized): Secondary | ICD-10-CM

## 2024-02-29 NOTE — Therapy (Signed)
 " OUTPATIENT PHYSICAL THERAPY TREATMENT   Patient Name: Frances Medina MRN: [id number] DOB:[birth year], 63 y.o., female Today's Date: 02/29/2024  END OF SESSION:  PT End of Session - 02/29/24 1026     Visit Number 6    Number of Visits 16    Date for Recertification  04/01/24    Authorization Type MCR/Aetna    PT Start Time 1032    PT Stop Time 1110    PT Time Calculation (min) 38 min    Activity Tolerance Patient tolerated treatment well    Behavior During Therapy [facility] for tasks assessed/performed          Past Medical History:  Diagnosis Date   Acute respiratory failure (HCC)    Acute respiratory failure with hypoxemia (HCC)    Aspiration into airway    Back pain 07/31/2017   Benign neoplasm of rectum    CAP (community acquired pneumonia) 08/05/2015   Depression with anxiety    Diabetes mellitus, new onset (HCC) 06/02/2023   Diverticulosis    Dizziness 03/13/2023   Dysphagia 03/2015   EGD, Dr Avram. mild antral gastritis, ? due to Ibuprofen .  no stricture but empirically maloney dilated esophagus.    Dysphagia, neurologic    E. 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   GERD (gastroesophageal reflux disease)    hiatal hernia   Hypertension    went away when I stopped smoking   Knee injury    Left hip pain 02/18/2017   Migraine    maybe couple times/month (03/20/2015)   Mild intermittent asthma 06/22/2017   Myasthenia gravis (HCC) 2017   Myasthenia gravis with acute exacerbation (HCC) 05/15/2015   Osteoarthritis of left knee 12/02/2013   Osteoarthritis of right knee 08/30/2013   Pancreatitis 07/25/2017   Protein-calorie malnutrition, severe 08/07/2015   Seasonal  allergies    takes Zytrec   Shortness of breath    Sinusitis 02/25/2018   Sleep apnea    with cPAp machine   Tracheostomy status (HCC)    Tubular adenoma of colon    Tumors    in my stomach   Umbilical hernia    watching , no plans for surgery at present   Urine incontinence 10/19/2015   Urticaria    VAP (ventilator-associated pneumonia) [facility]    Past Surgical History:  Procedure Laterality Date   BIOPSY  07/05/2019   Procedure: BIOPSY;  Surgeon: Aneita [name], MD;  Location: [facility];  Service: Endoscopy;;   CESAREAN SECTION  1987; 1989   COLONOSCOPY N/A 09/22/2023   Procedure: COLONOSCOPY;  Surgeon: Federico Rosario BROCKS, MD;  Location: [facility];  Service: Gastroenterology;  Laterality: N/A;   COLONOSCOPY WITH PROPOFOL  N/A 09/19/2013   Procedure: COLONOSCOPY WITH PROPOFOL ;  Surgeon: [name] Aneita, MD;  Location: [facility];  Service: Endoscopy;  Laterality: N/A;   COLONOSCOPY WITH PROPOFOL  N/A 07/05/2019   Procedure: COLONOSCOPY WITH PROPOFOL ;  Surgeon: Aneita [name], MD;  Location: [facility];  Service: Endoscopy;  Laterality: N/A;   DILATION AND CURETTAGE OF UTERUS     ESOPHAGOGASTRODUODENOSCOPY N/A 08/10/2015   Procedure: ESOPHAGOGASTRODUODENOSCOPY (EGD);  Surgeon: Lupita FORBES Avram, MD;  Location: [facility] ENDOSCOPY;  Service: Endoscopy;  Laterality: N/A;  ESOPHAGOGASTRODUODENOSCOPY N/A 09/22/2023   Procedure: EGD (ESOPHAGOGASTRODUODENOSCOPY);  Surgeon: Federico Rosario BROCKS, MD;  Location: [facility];  Service: Gastroenterology;  Laterality: N/A;   ESOPHAGOGASTRODUODENOSCOPY (EGD) WITH PROPOFOL  N/A 03/21/2015   Procedure: ESOPHAGOGASTRODUODENOSCOPY (EGD) WITH PROPOFOL ;  Surgeon: Lupita FORBES Commander, MD;  Location: [facility] ENDOSCOPY;  Service: Endoscopy;  Laterality: N/A;   ESOPHAGOGASTRODUODENOSCOPY (EGD) WITH PROPOFOL  N/A 07/05/2019   Procedure: ESOPHAGOGASTRODUODENOSCOPY (EGD) WITH PROPOFOL ;  Surgeon: Aneita [name], MD;  Location: [facility];  Service: Endoscopy;  Laterality: N/A;    PARTIAL KNEE ARTHROPLASTY Right 08/30/2013   Procedure: RIGHT UNICOMPARTMENTAL KNEE;  Surgeon: Fonda SHAUNNA Olmsted, MD;  Location: [facility] OR;  Service: Orthopedics;  Laterality: Right;   PARTIAL KNEE ARTHROPLASTY Left 12/02/2013   Procedure: LEFT KNEE UNI ARTHROPLASTY;  Surgeon: Fonda SHAUNNA Olmsted, MD;  Location: [facility];  Service: Orthopedics;  Laterality: Left;   PEG PLACEMENT N/A 08/10/2015   Procedure: PERCUTANEOUS ENDOSCOPIC GASTROSTOMY (PEG) PLACEMENT;  Surgeon: Lupita FORBES Commander, MD;  Location[facility] END[facility]COPY;  Service: Endoscopy;  Laterality: N/A;   POLYPECTOMY  07/05/2019   Procedure: POLYPECTOMY;  Surgeon: Aneita [name], MD;  Location: [facility];  Service: Endoscopy;;   TUBAL LIGATION  1989   VAGINAL HYSTERECTOMY  1990's?   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) 06/02/2023   Idiopathic urticaria 04/15/2023   Knee pain 03/13/2023   Morbid obesity (HCC) 01/26/2023   Lumbar radiculopathy 12/03/2021   Positive ANA (antinuclear antibody) 08/26/2019   History of colonic polyps    Macromastia 06/17/2019   Seasonal allergies 02/18/2019   GAD (generalized anxiety disorder) 08/06/2018   Iron  deficiency anemia 07/31/2017   Osteoarthritis of left hip 02/18/2017   OSA on CPAP 09/11/2016   Restrictive lung disease 10/11/2015   Major depression    Headache, migraine    Essential hypertension    Splenomegaly 04/07/2015   Myasthenia gravis (HCC)    Osteoarthritis of left knee 12/02/2013   Benign neoplasm of descending colon 09/19/2013   Osteoarthritis of right knee 08/30/2013   GERD (gastroesophageal reflux disease) 06/23/2013   Fibroids 06/23/2013    PCP: Anders Otto DASEN, MD   REFERRING PROVIDER: Anders Otto DASEN, MD   REFERRING DIAG: [phone] (ICD-10-CM) - Primary osteoarthritis of left hip M54.16 (ICD-10-CM) - Lumbar radiculopathy   Rationale for Evaluation and Treatment: Rehabilitation  THERAPY DIAG:  Radiculopathy, lumbar region  Pain in left hip  Pain in right leg  Muscle weakness (generalized)  ONSET DATE: 01/05/2024 date of referral  SUBJECTIVE:  SUBJECTIVE STATEMENT: 02/29/2024  Pt reports she is in pain today due to the weather. She later notes that PT does not seem to be helping since she is in more pain later in the day after PT.   EVAL Patient reports to PT with chronic low back and and L>R hip pain. She describes having tenderness on L hip. She does have some numbness if she lays on her L side, as well as prolonged standing. She states that she occasionally has cramping in her toes. She has difficulty navigating her stairs at home. When I get to the last step, I have to throw my leg over. She also reports having difficulty with prolonged standing, and has to lean over the shopping cart to prevent numbness in her legs. She does have difficulty sleeping, and is often tossing-and-turning.   PERTINENT HISTORY:  Hx of BIL TKA Other relevant PMHx includes depression, DM, diverticulosis, myasthenia gravis, LE edema, obesity, anxiety  PAIN:  Are you having pain?  Yes: NPRS scale: 8/10, took tylenol  prior to our session Pain location: back, L>R hip  Pain description: cramping, tenderness Aggravating factors: prolonged standing, laying down   Relieving factors: tylenol , sitting after prolonged standing   PRECAUTIONS: None  RED FLAGS: None   WEIGHT BEARING RESTRICTIONS: No  FALLS:  Has patient fallen in last 6 months? Yes. Number of falls often losing her balance or tripping but I  catch myself   LIVING ENVIRONMENT: Lives with: lives with their family and lives with their spouse Lives in: House/apartment Stairs: Yes: Internal: 1 flight of steps; on right going up and can reach both and External: 5 steps; can reach both Has following equipment at home: Single point cane  OCCUPATION: n/a   PLOF: Independent with community mobility with device  PATIENT GOALS: Less pain with daily activities, ability to stand and lay down without severe pain.  Ability to ascend stairs without challenge  NEXT MD VISIT: none scheduled   OBJECTIVE:  Note: Objective measures were completed at Evaluation unless otherwise noted.  DIAGNOSTIC FINDINGS:  10/09/2023   IMPRESSION: 1. Mild osteoarthritis of the left hip. 2. Right hip joint space is preserved.  PATIENT SURVEYS:  Modified Oswestry:  MODIFIED OSWESTRY DISABILITY SCALE  Date: 02/05/2024  Score  Pain intensity 1 = The pain is bad, but I can manage without having to take pain medication  2. Personal care (washing, dressing, etc.) 1 =  I can take care of myself normally, but it increases my pain.  3. Lifting 3 = Pain prevents me from lifting heavy weights, but I can manage light to medium weights if they are conveniently positioned  4. Walking 1 = Pain prevents me from walking more than 1 mile.  5. Sitting 2 =  Pain prevents me from sitting more than 1 hour.  6. Standing 2 =  Pain prevents me from standing more than 1 hour  7. Sleeping 5 =  Pain prevents me from sleeping at all.  8. Social Life 3 =  Pain prevents me from going out very often.  9. Traveling 1 =  I can travel anywhere, but it increases my pain.  10. Employment/ Homemaking 2 = I can perform most of my homemaking/job duties, but pain prevents me from performing more physically stressful activities (eg, lifting, vacuuming).  Total 21/50   Interpretation of scores: Score Category Description  0-20% Minimal Disability The patient can cope with most living  activities. Usually no treatment is indicated apart from advice on  lifting, sitting and exercise  21-40% Moderate Disability The patient experiences more pain and difficulty with sitting, lifting and standing. Travel and social life are more difficult and they may be disabled from work. Personal care, sexual activity and sleeping are not grossly affected, and the patient can usually be managed by conservative means  41-60% Severe Disability Pain remains the main problem in this group, but activities of daily living are affected. These patients require a detailed investigation  61-80% Crippled Back pain impinges on all aspects of the patients life. Positive intervention is required  81-100% Bed-bound These patients are either bed-bound or exaggerating their symptoms  Bluford FORBES Zoe DELENA Karon DELENA, et al. Surgery versus conservative management of stable thoracolumbar fracture: the PRESTO feasibility RCT. [place] ([place]): [employer]; 2021 Nov. [facility] Technology Assessment, No. 25.62.) Appendix 3, Oswestry Disability Index category descriptors. Available from: Findjewelers.cz  Minimally Clinically Important Difference (MCID) = 12.8%  COGNITION: Overall cognitive status: Within functional limits for tasks assessed     SENSATION: Not tested  MUSCLE LENGTH: deferred  POSTURE: rounded shoulders and forward head  PALPATION: deferred  LUMBAR ROM:   AROM eval  Flexion 75%  Extension 75%  Right lateral flexion 50%  Left lateral flexion 50%  Right rotation 50%  Left rotation 50%   (Blank rows = not tested)  LOWER EXTREMITY ROM:     Active  Right eval Left eval  Hip flexion    Hip extension    Hip abduction    Hip adduction    Hip internal rotation    Hip external rotation    Knee flexion    Knee extension    Ankle dorsiflexion    Ankle plantarflexion    Ankle inversion    Ankle eversion     (Blank rows = not tested)  LOWER EXTREMITY  MMT:    MMT Right eval Left eval  Hip flexion 4- 4-  Hip extension    Hip abduction 4- 4-  Hip adduction 3- 3-  Hip internal rotation    Hip external rotation    Knee flexion 5 5  Knee extension 5 5  Ankle dorsiflexion 4+ 4+  Ankle plantarflexion    Ankle inversion    Ankle eversion     (Blank rows = not tested)     TREATMENT DATE:   [facility] Adult PT Treatment:                                                DATE: 02/29/2024 Therapeutic exercise (hot pack on LB during some seated exercises) Seated ball squeeze hip adduction 2 x 10  Seated IR with ball at knees and RTB at ankles x 10 B  Seated hip ER with RTB 2 x 10 Seated hip hinge 2x10 with 10# KB Seated marches with RTB 2 x 10 STS with sumo stance 2 x 10  Ball roll out forward x 10     PATIENT EDUCATION:  Education details: reviewed initial home exercise program; discussion of POC, prognosis and goals for skilled PT   Person educated: Patient Education method: Programmer, Multimedia, Demonstration, and Handouts Education comprehension: verbalized understanding, returned demonstration, and needs further education  HOME EXERCISE PROGRAM: Access Code: [email or web address] URL: [email or web address] Date: 02/18/2024 Prepared by: Marko Molt  Exercises - Supine Pelvic Clock  - 1 x daily - 7 x weekly - 3  sets - 10 reps - Supine Transversus Abdominis Bracing - Hands on Stomach  - 1 x daily - 7 x weekly - 3 sets - 10 reps - Small Range Straight Leg Raise  - 1 x daily - 7 x weekly - 3 sets - 10 reps - Seated Gluteal Sets  - 1 x daily - 7 x weekly - 3 sets - 10 reps - Seated Hip Flexion Toward Target  - 1 x daily - 7 x weekly - 3 sets - 10 reps - Standing Sidebends  - 1 x daily - 7 x weekly - 2 sets - 5-10 reps - 3 sec hold - Seated 3 Way Exercise Ball Roll Out Stretch  - 1 x daily - 7 x weekly - 2 sets - 5-10 reps - 3 sec hold - Abdominal Press into [place]  - 1 x daily - 7 x weekly - 2 sets - 5-10 reps - 3 sec  hold  ASSESSMENT:  CLINICAL IMPRESSION: 02/29/2024  pt arrived with increased pain today and doubts about continuing PT. Pt able to complete all exercises on this date without exacerbation of symptoms. Pt advised to contact her PCP if she would like to explore other options outside of PT. We will continue to progress as tolerated.   EVAL Latrece is a 63 y.o. female who was seen today for physical therapy evaluation and treatment for persistent low back pain and BIL hip pain. She is demonstrating diminished LS AROM with pain at end range of motion, and diminished BIL LE MMT scores. She also demonstrates decreased postural endurance with unsupported sitting. She has related pain and difficulty with prolonged standing, walking, laying down and performance of related ADLs/IADLs. She reported decreased pain severity with repeated lumbar flexion and repeated R side bending, and is encouraged to continue with these directions until f/u visit. She requires skilled PT services at this time to address relevant deficits and improve overall function.     OBJECTIVE IMPAIRMENTS: decreased activity tolerance, decreased ROM, decreased strength, improper body mechanics, postural dysfunction, and pain.   ACTIVITY LIMITATIONS: carrying, lifting, bending, standing, squatting, sleeping, stairs, and locomotion level  PARTICIPATION LIMITATIONS: meal prep, cleaning, laundry, shopping, and community activity  PERSONAL FACTORS: Past/current experiences, Time since onset of injury/illness/exacerbation, 3+ comorbidities: Other relevant PMHx includes depression, DM, diverticulosis, myasthenia gravis, LE edema, obesity, anxiety, and hx of BIL TKA are also affecting patient's functional outcome.   REHAB POTENTIAL: Fair    CLINICAL DECISION MAKING: Evolving/moderate complexity  EVALUATION COMPLEXITY: Moderate   GOALS: Goals reviewed with patient? YES  SHORT TERM GOALS: Target date: 03/08/2024   Patient will be  independent with initial home program at least 3 days/week.  Baseline: provided at eval Goal Status: INITIAL   2.  Patient will demonstrate improved postural awareness for at least 15 minutes while seated without need for cueing from PT.  Baseline: see objective measures Goal Status: INITIAL   3.  Patient will demonstrate improved Lumbar AROM to at least 75-80% of full range Baseline: see objective measures Goal Status: INITIAL      LONG TERM GOALS: Target date: 04/05/2024    Patient will report improved overall functional ability with ODI score of 8/50 or less.  Baseline: 21/50 Goal Status: INITIAL    2.  Patient will demonstrate at least 4+/5 MMT  Baseline: see objective measures  Goal status: INITIAL    3.  Patient will demonstrate ability to perform floor to waist lifting of at least 25# using appropriate  body mechanics and with no more than minimal pain in order to safely perform normal daily/occupational tasks.   Baseline: unable to tolerate  Goal Status: INITIAL    4.  Patient will report ability to sleep at least 6 hours/night with minimal pain-related sleep disturbance Baseline: I sleep less than 2 hours d/t pain  Goal status: INITIAL    PLAN:  PT FREQUENCY: 1-2x/week  PT DURATION: 8 weeks  PLANNED INTERVENTIONS: 97164- PT Re-evaluation, 97750- Physical Performance Testing, 97110-Therapeutic exercises, 97530- Therapeutic activity, [id number]- Neuromuscular re-education, 97535- Self Care, [id number]- Manual therapy, [id number]- Aquatic Therapy, H9716- Electrical stimulation (unattended), [id number]- Electrical stimulation (manual), 20560 (1-2 muscles), 20561 (3+ muscles)- Dry Needling, Patient/Family education, Joint mobilization, Cryotherapy, and Moist heat.  PLAN FOR NEXT SESSION: address lumbar mobility, LQ strengthening, low-impact aerobic activity, manual therapy and modalities for pain modulation as indicated; review and update HEP; onging patient education regarding current  presentation/prognosis   Marijo Berber PT, DPT 02/29/2024 11:19 AM     "

## 2024-02-29 NOTE — Telephone Encounter (Signed)
 The dosage override has been approved, submitted refill, copay is $0.

## 2024-03-01 ENCOUNTER — Other Ambulatory Visit (HOSPITAL_COMMUNITY): Payer: Self-pay

## 2024-03-01 ENCOUNTER — Encounter: Payer: Self-pay | Admitting: Family Medicine

## 2024-03-01 NOTE — Telephone Encounter (Signed)
 Patient is coming in on 03/18/24 @830 

## 2024-03-01 NOTE — Progress Notes (Unsigned)
 "    03/01/2024 DELLE ANDRZEJEWSKI [id number] 27-Apr-1960  Referring provider: Anders Otto DASEN, MD Primary GI doctor: Dr. Federico (Dr. Aneita)  ASSESSMENT AND PLAN:  Iron  deficiency anemia with history of microcytic anemia thrombocytopenia 3 years ago 2 months ago 09/11/2023  HGB 11.1 MCV 73.2 Platelets 284 06/10/2023 Iron  41 Ferritin 38.8 B12 629 12/28/2023  HGB 10.8 MCV 68.1 Repeated and verified X2. Platelets 210.0 12/28/2023 Iron  29 Ferritin 29.4 B12 629  Recent Labs    03/16/23 1055 05/06/23 1041 05/25/23 0720 05/25/23 0734 06/10/23 0944 09/11/23 1155 12/28/23 0954  HGB 12.0 11.5 11.4* 19.0* 11.7* 11.1* 10.8*   09/22/2023 colonoscopy at [facility] long normal TI 4 mm polyp ascending 2 polyps 3 to 5 mm rectum, internal hemorrhoids TA polyps recall 7 years 09/22/2023 EGD benign-appearing esophageal stenosis dilated 18 mm, HH, gastritis, normal duodenum no varices, negative H. Pylori No overt GI bleeding She is not on an iron  supplement -Check iron , ferritin, CBC -Consider VCE if continuing iron  def after trial -Consider IV iron   GERD with dysphagia/odynophagia  Odynophagia resolved, continues with regurgitation of liquids and solids No weight loss 06/19/2023 barium swallow nonspecific esophageal motility disorder 13 mm barium tablet passed briskly in stomach Previous empiric treatment with Diflucan  09/22/2023 EGD benign-appearing esophageal stenosis dilated 18 mm, HH, gastritis, normal duodenum no varices, negative H. Pylori History of myesthesia, on allergy  meds for urticaria  Started on nexium  x 10/16 twice a day - finish trial of nexium  BID, if no difference will get esophageal manometry - follow up neurology - possibly worsened by GLP  Personal history of colon polyps Colonoscopy 07/2019 2 polyps 5 to 7 mm descending colon Hyperplastic polyps recall 7 years 09/22/2023 colonoscopy at [facility] long normal TI 4 mm polyp ascending 2 polyps 3 to 5 mm rectum, internal hemorrhoids TA  polyps recall 7 years  Metabolic dysfunction associated seatohepatitis (MASH, formerly NASH)  by history and ultrasound, kPA 13, some concern for advanced fibrosis    Latest Ref Rng & Units 12/28/2023    9:54 AM 09/11/2023   11:55 AM 06/10/2023    9:44 AM  Hepatic Function  Total Protein 6.0 - 8.3 g/dL 6.0 - 8.3 g/dL 6.9    6.9  7.2  6.4   Albumin 3.5 - 5.2 g/dL 3.5 - 5.2 g/dL 4.2    4.2  4.1  4.2   AST 0 - 37 U/L 0 - 37 U/L 13    13  18  16    ALT 0 - 35 U/L 0 - 35 U/L 10    10  16  15    Alk Phosphatase 39 - 117 U/L 39 - 117 U/L 74    74  94  82   Total Bilirubin 0.2 - 1.2 mg/dL 0.2 - 1.2 mg/dL 0.3    0.3  0.4  0.4   Bilirubin, Direct 0.0 - 0.3 mg/dL 0.2   0.1   MELD 3.0: 7  06/2023 hepatitis panel negative, needs hepatitis A and B vaccination  IgG and ASMA, AMA negative, GGT normal, ANA negative 06/19/2023 coarse echogenic liver diffuse hepatic steatosis no focal lesions normal spleen K PA 13.1 and highly suggestive of advanced chronic liver disease No evidence of varices 09/22/2023 - need LFTs and CBC monitored every 6 months -Suggest right upper quadrant ultrasound every 6 months, suggest recall US  in March -Continue to work on risk factor modification including diet exercise and control of risk factors including blood sugars. - continue ozempic , consider wegovy   Morbid  obesity was unable to complete Roux-en-Y due to hepatomegaly 2022 There is no height or weight on file to calculate BMI.  -Patient has been advised to make an attempt to improve diet and exercise patterns to aid in weight loss. -Recommended diet heavy in fruits and veggies and low in animal meats, cheeses, and dairy products, appropriate calorie intake -Patient has been advised to make an attempt to improve diet and exercise patterns to aid in weight loss. -Recommended diet heavy in fruits and veggies and low in animal meats, cheeses, and dairy products, appropriate calorie intake - now on ozempic  ( allergy  to  mounjaro ), discussed GLP   Cholelithiasis seen on imaging without inflammation Patient has no symptoms at this time, labs are normal.  -We went over symptoms of cholecystitis, cholangitis and pancreatitis that would prompt her to return to the ER if they happen.  -Advised low fat diet.  -Agrees to monitor at his time.   Type 2 diabetes A1c 8 on 05/27/2023 On CGM Now on ozempic   OSA on CPAP  Myasthenia gravis In remission   Patient Care Team: Anders Otto DASEN, MD as PCP - General (Family Medicine) Loni Soyla LABOR, MD as PCP - Cardiology (Cardiology) Wonda Cy BROCKS, RD as Dietitian (Family Medicine)  HISTORY OF PRESENT ILLNESS: 63 y.o. female with a past medical history listed below presents for evaluation of dysphagia and GERD.   I last saw the patient in the office 12/28/2023  Discussed the use of AI scribe software for clinical note transcription with the patient, who gave verbal consent to proceed.  History of Present Illness          She  reports that she quit smoking about 11 years ago. Her smoking use included cigarettes. She started smoking about 50 years ago. She has a 57.3 pack-year smoking history. She has been exposed to tobacco smoke. She has never used smokeless tobacco. She reports that she does not drink alcohol and does not use drugs.  RELEVANT GI HISTORY, IMAGING AND LABS: Results          CBC    Component Value Date/Time   WBC 7.6 12/28/2023 0954   RBC 5.05 12/28/2023 0954   HGB 10.8 (L) 12/28/2023 0954   HGB 11.5 05/06/2023 1041   HGB 12.4 10/30/2015 1112   HCT 34.4 (L) 12/28/2023 0954   HCT 36.9 05/06/2023 1041   HCT 38.9 10/30/2015 1112   PLT 210.0 12/28/2023 0954   PLT 159 05/06/2023 1041   MCV 68.1 Repeated and verified X2. (L) 12/28/2023 0954   MCV 71 (L) 05/06/2023 1041   MCV 72.2 (L) 10/30/2015 1112   MCH 22.1 (L) 09/11/2023 1155   MCHC 31.3 12/28/2023 0954   RDW 19.6 (H) 12/28/2023 0954   RDW 16.9 (H) 05/06/2023 1041   RDW  19.0 (H) 10/30/2015 1112   LYMPHSABS 1.3 12/28/2023 0954   LYMPHSABS 1.6 05/06/2023 1041   LYMPHSABS 1.0 10/30/2015 1112   MONOABS 0.3 12/28/2023 0954   MONOABS 0.4 10/30/2015 1112   EOSABS 0.0 12/28/2023 0954   EOSABS 0.0 05/06/2023 1041   BASOSABS 0.0 12/28/2023 0954   BASOSABS 0.0 05/06/2023 1041   BASOSABS 0.0 10/30/2015 1112   Recent Labs    03/16/23 1055 05/06/23 1041 05/25/23 0720 05/25/23 0734 06/10/23 0944 09/11/23 1155 12/28/23 0954  HGB 12.0 11.5 11.4* 19.0* 11.7* 11.1* 10.8*    CMP     Component Value Date/Time   NA 142 12/28/2023 0954   NA 144 05/06/2023 1041  NA 141 10/30/2015 1112   K 3.6 12/28/2023 0954   K 4.2 10/30/2015 1112   CL 106 12/28/2023 0954   CO2 27 12/28/2023 0954   CO2 26 10/30/2015 1112   GLUCOSE 100 (H) 12/28/2023 0954   GLUCOSE 94 10/30/2015 1112   BUN 11 12/28/2023 0954   BUN 11 05/06/2023 1041   BUN 21.6 10/30/2015 1112   CREATININE 0.57 12/28/2023 0954   CREATININE 0.72 11/07/2015 0911   CREATININE 0.7 10/30/2015 1112   CALCIUM 9.1 12/28/2023 0954   CALCIUM 9.9 10/30/2015 1112   PROT 6.9 12/28/2023 0954   PROT 6.9 12/28/2023 0954   PROT 6.9 05/06/2023 1041   PROT 7.1 10/30/2015 1112   ALBUMIN 4.2 12/28/2023 0954   ALBUMIN 4.2 12/28/2023 0954   ALBUMIN 4.4 05/06/2023 1041   ALBUMIN 3.4 (L) 10/30/2015 1112   AST 13 12/28/2023 0954   AST 13 12/28/2023 0954   AST 13 10/30/2015 1112   ALT 10 12/28/2023 0954   ALT 10 12/28/2023 0954   ALT 16 10/30/2015 1112   ALKPHOS 74 12/28/2023 0954   ALKPHOS 74 12/28/2023 0954   ALKPHOS 80 10/30/2015 1112   BILITOT 0.3 12/28/2023 0954   BILITOT 0.3 12/28/2023 0954   BILITOT 0.2 05/06/2023 1041   BILITOT 0.40 10/30/2015 1112   GFRNONAA >60 09/11/2023 1155   GFRNONAA >89 11/07/2015 0911   GFRAA 109 08/26/2019 1132   GFRAA >89 11/07/2015 0911      Latest Ref Rng & Units 12/28/2023    9:54 AM 09/11/2023   11:55 AM 06/10/2023    9:44 AM  Hepatic Function  Total Protein 6.0 - 8.3  g/dL 6.0 - 8.3 g/dL 6.9    6.9  7.2  6.4   Albumin 3.5 - 5.2 g/dL 3.5 - 5.2 g/dL 4.2    4.2  4.1  4.2   AST 0 - 37 U/L 0 - 37 U/L 13    13  18  16    ALT 0 - 35 U/L 0 - 35 U/L 10    10  16  15    Alk Phosphatase 39 - 117 U/L 39 - 117 U/L 74    74  94  82   Total Bilirubin 0.2 - 1.2 mg/dL 0.2 - 1.2 mg/dL 0.3    0.3  0.4  0.4   Bilirubin, Direct 0.0 - 0.3 mg/dL 0.2   0.1       Current Medications:   Current Outpatient Medications (Endocrine & Metabolic):    tirzepatide  (MOUNJARO ) 7.5 MG/0.5ML Pen, Inject 7.5 mg into the skin once a week.  Current Outpatient Medications (Cardiovascular):    EPINEPHrine  0.3 mg/0.3 mL IJ SOAJ injection, Inject 0.3 mg into the muscle as needed for anaphylaxis.   lisinopril  (ZESTRIL ) 5 MG tablet, Take 1 tablet (5 mg total) by mouth at bedtime.  Current Outpatient Medications (Respiratory):    albuterol  (PROVENTIL ) (2.5 MG/3ML) 0.083% nebulizer solution, Take 3 mLs (2.5 mg total) by nebulization every 6 (six) hours as needed for wheezing or shortness of breath.   albuterol  (VENTOLIN  HFA) 108 (90 Base) MCG/ACT inhaler, Inhale 2 puffs into the lungs every 6 (six) hours as needed for wheezing or shortness of breath   azelastine  (ASTELIN ) 0.1 % nasal spray, Place 1 spray into both nostrils 2 (two) times daily. Use in each nostril as directed   fluticasone  (FLONASE ) 50 MCG/ACT nasal spray, Place 2 sprays into both nostrils daily.   montelukast  (SINGULAIR ) 10 MG tablet, Take 1 tablet (10 mg  total) by mouth at bedtime.   WIXELA INHUB 250-50 MCG/ACT AEPB, INHALE 1 PUFF INTO THE LUNGS IN THE MORNING AND AT BEDTIME.  Current Outpatient Medications (Analgesics):    acetaminophen  (TYLENOL ) 500 MG tablet, Take 500 mg by mouth in the morning and at bedtime.  Current Outpatient Medications (Hematological):    Cyanocobalamin  (VITAMIN B12 PO), Take 1 tablet by mouth daily.  Current Outpatient Medications (Other):    Accu-Chek Softclix Lancets lancets, Use as  instructed   APPLE CIDER VINEGAR PO, Take 1 capsule by mouth daily.   Ascorbic Acid (VITAMIN C PO), Take 1,000 mg by mouth daily.   Blood Glucose Monitoring Suppl (ACCU-CHEK GUIDE ME) w/Device KIT, Use as directed.   clonazePAM  (KLONOPIN ) 0.5 MG tablet, TAKE 1 TABLET BY MOUTH TWICE A DAY AS NEEDED FOR ANXIETY   Continuous Glucose Sensor (FREESTYLE LIBRE 3 PLUS SENSOR) MISC, Change sensor every 15 days. (Patient taking differently: as needed. Change sensor every 15 days.)   famotidine  (PEPCID ) 20 MG tablet, Take 1 tablet (20 mg total) by mouth 2 (two) times daily as needed for heartburn or indigestion (hives). (Patient taking differently: Take 20 mg by mouth 2 (two) times daily.)   glucose blood (ACCU-CHEK SMARTVIEW) test strip, Use as instructed (Patient taking differently: 1 each as needed. Use as instructed)   halobetasol  (ULTRAVATE ) 0.05 % cream, Apply topically 2 (two) times daily.   hydrOXYzine  (ATARAX ) 10 MG tablet, Take 1 tablet (10 mg total) by mouth at bedtime as needed for itching (or hives).   Lancets Misc. (ACCU-CHEK SOFTCLIX LANCET DEV) KIT, Use as directed   Menthol  10 MG LOZG, Take 1 lozenge by mouth daily as needed (cough).   mycophenolate  (CELLCEPT ) 500 MG tablet, Take 500 mg by mouth 2 (two) times daily.   nystatin  (MYCOSTATIN /NYSTOP ) powder, APPLY TOPICALLY 3 TIMES DAILY AS NEEDED.   Remibrutinib (RHAPSIDO) 25 MG TABS, Take 1 tablet by mouth in the morning and at bedtime.   sertraline  (ZOLOFT ) 50 MG tablet, Take 3 tablets (150 mg total) by mouth daily.   VITAMIN D , CHOLECALCIFEROL, PO, Take 1 tablet by mouth daily. otc   Medical History:  Past Medical History:  Diagnosis Date   Acute respiratory failure (HCC)    Acute respiratory failure with hypoxemia (HCC)    Aspiration into airway    Back pain 07/31/2017   Benign neoplasm of rectum    CAP (community acquired pneumonia) 08/05/2015   Depression with anxiety    Diabetes mellitus, new onset (HCC) 06/02/2023    Diverticulosis    Dizziness 03/13/2023   Dysphagia 03/2015   EGD, Dr Avram. mild antral gastritis, ? due to Ibuprofen .  no stricture but empirically maloney dilated esophagus.    Dysphagia, neurologic    E. 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   GERD (gastroesophageal reflux disease)    hiatal hernia   Hypertension    went away when I stopped smoking   Knee injury    Left hip pain 02/18/2017   Migraine    maybe couple times/month (03/20/2015)   Mild intermittent asthma 06/22/2017   Myasthenia gravis (HCC) 2017   Myasthenia gravis with acute exacerbation (HCC) 05/15/2015   Osteoarthritis of left knee 12/02/2013  Osteoarthritis of right knee 08/30/2013   Pancreatitis 07/25/2017   Protein-calorie malnutrition, severe 08/07/2015   Seasonal allergies    takes Zytrec   Shortness of breath    Sinusitis 02/25/2018   Sleep apnea    with cPAp machine   Tracheostomy status (HCC)    Tubular adenoma of colon    Tumors    in my stomach   Umbilical hernia    watching , no plans for surgery at present   Urine incontinence 10/19/2015   Urticaria    VAP (ventilator-associated pneumonia) (HCC)    Allergies:  Allergies  Allergen Reactions   Ciprofloxacin  Shortness Of Breath and Rash   Naproxen  Shortness Of Breath   Shrimp [Shellfish Allergy ] Hives and Shortness Of Breath    ER required   Dicyclomine Hives   Methocarbamol  Hives   Pineapple Itching    Itchy lips and tongue   Morphine  And Codeine  Other (See Comments)    Severe headache     Surgical History:  She  has a past surgical history that includes Cesarean section (8012; 1989); Dilation and curettage of uterus; Tubal ligation  (1989); Vaginal hysterectomy (1990's?); Partial knee arthroplasty (Right, 08/30/2013); Colonoscopy with propofol  (N/A, 09/19/2013); Partial knee arthroplasty (Left, 12/02/2013); Esophagogastroduodenoscopy (egd) with propofol  (N/A, 03/21/2015); Esophagogastroduodenoscopy (N/A, 08/10/2015); PEG placement (N/A, 08/10/2015); Colonoscopy with propofol  (N/A, 07/05/2019); Esophagogastroduodenoscopy (egd) with propofol  (N/A, 07/05/2019); polypectomy (07/05/2019); biopsy (07/05/2019); Colonoscopy (N/A, 09/22/2023); and Esophagogastroduodenoscopy (N/A, 09/22/2023). Family History:  Her family history includes Allergic rhinitis in her sister and sister; Anemia in her sister; Atopy in her sister; COPD in her brother; Heart disease (age of onset: 97) in her mother; Hypertension in her father and mother; Leukemia in her sister and sister; Other in her sister; Stroke in her father.  REVIEW OF SYSTEMS  : All other systems reviewed and negative except where noted in the History of Present Illness.  PHYSICAL EXAM: There were no vitals taken for this visit. Physical Exam   GENERAL APPEARANCE: morbidly obese, in no apparent distress. HEENT: Oral cavity without white plaques or yeast. No cervical lymphadenopathy, unremarkable thyroid , sclerae anicteric, conjunctiva pink. RESPIRATORY: Respiratory effort normal, lungs clear to auscultation bilaterally without rales, rhonchi, or wheezing. CARDIO: Heart with occasional trigeminy, normal rhythm. Peripheral pulses intact. ABDOMEN: Soft, non-distended, obese, active bowel sounds in all 4 quadrants, no pain no rebound, no mass appreciated. RECTAL: Declines. MUSCULOSKELETAL: Full ROM, normal gait, without edema. SKIN: Dry, intact without rashes or lesions. No jaundice. Hives along bilateral arms.. NEURO: Alert, oriented, no focal deficits. PSYCH: Cooperative, normal mood and affect. EXTREMITIES: 1+ edema in legs, no redness or warmth.      Alan JONELLE Coombs, PA-C 3:51 PM   "

## 2024-03-02 ENCOUNTER — Ambulatory Visit

## 2024-03-02 DIAGNOSIS — M79604 Pain in right leg: Secondary | ICD-10-CM

## 2024-03-02 DIAGNOSIS — M25552 Pain in left hip: Secondary | ICD-10-CM

## 2024-03-02 DIAGNOSIS — M6281 Muscle weakness (generalized): Secondary | ICD-10-CM | POA: Diagnosis not present

## 2024-03-02 DIAGNOSIS — M5416 Radiculopathy, lumbar region: Secondary | ICD-10-CM

## 2024-03-02 NOTE — Therapy (Signed)
 " OUTPATIENT PHYSICAL THERAPY TREATMENT   Patient Name: Frances Medina MRN: [id number] DOB:1960/08/15, 63 y.o., female Today's Date: 03/02/2024  END OF SESSION:  PT End of Session - 03/02/24 1047     Visit Number 7    Number of Visits 16    Date for Recertification  04/01/24    Authorization Type MCR/Aetna    PT Start Time 1047    PT Stop Time 1125    PT Time Calculation (min) 38 min    Activity Tolerance Patient tolerated treatment well    Behavior During Therapy [facility] for tasks assessed/performed           Past Medical History:  Diagnosis Date   Acute respiratory failure (HCC)    Acute respiratory failure with hypoxemia (HCC)    Aspiration into airway    Back pain 07/31/2017   Benign neoplasm of rectum    CAP (community acquired pneumonia) 08/05/2015   Depression with anxiety    Diabetes mellitus, new onset (HCC) 06/02/2023   Diverticulosis    Dizziness 03/13/2023   Dysphagia 03/2015   EGD, Dr Avram. mild antral gastritis, ? due to Ibuprofen .  no stricture but empirically maloney dilated esophagus.    Dysphagia, neurologic    E. 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   GERD (gastroesophageal reflux disease)    hiatal hernia   Hypertension    went away when I stopped smoking   Knee injury    Left hip pain 02/18/2017   Migraine    maybe couple times/month (03/20/2015)   Mild intermittent asthma 06/22/2017   Myasthenia gravis (HCC) 2017   Myasthenia gravis with acute exacerbation (HCC) 05/15/2015   Osteoarthritis of left knee 12/02/2013   Osteoarthritis of right knee 08/30/2013   Pancreatitis 07/25/2017   Protein-calorie malnutrition, severe 08/07/2015   Seasonal  allergies    takes Zytrec   Shortness of breath    Sinusitis 02/25/2018   Sleep apnea    with cPAp machine   Tracheostomy status (HCC)    Tubular adenoma of colon    Tumors    in my stomach   Umbilical hernia    watching , no plans for surgery at present   Urine incontinence 10/19/2015   Urticaria    VAP (ventilator-associated pneumonia) [facility])    Past Surgical History:  Procedure Laterality Date   BIOPSY  07/05/2019   Procedure: BIOPSY;  Surgeon: Aneita [name], MD;  Location: [facility];  Service: Endoscopy;;   CESAREAN SECTION  1987; 1989   COLONOSCOPY N/A 09/22/2023   Procedure: COLONOSCOPY;  Surgeon: Federico Rosario BROCKS, MD;  Location: [facility];  Service: Gastroenterology;  Laterality: N/A;   COLONOSCOPY WITH PROPOFOL  N/A 09/19/2013   Procedure: COLONOSCOPY WITH PROPOFOL ;  Surgeon: [name] Aneita, MD;  Location: [facility];  Service: Endoscopy;  Laterality: N/A;   COLONOSCOPY WITH PROPOFOL  N/A 07/05/2019   Procedure: COLONOSCOPY WITH PROPOFOL ;  Surgeon: Aneita [name], MD;  Location: [facility];  Service: Endoscopy;  Laterality: N/A;   DILATION AND CURETTAGE OF UTERUS     ESOPHAGOGASTRODUODENOSCOPY N/A 08/10/2015   Procedure: ESOPHAGOGASTRODUODENOSCOPY (EGD);  Surgeon: Lupita FORBES Avram, MD;  Location: [facility] ENDOSCOPY;  Service: Endoscopy;  Laterality: N/A;  ESOPHAGOGASTRODUODENOSCOPY N/A 09/22/2023   Procedure: EGD (ESOPHAGOGASTRODUODENOSCOPY);  Surgeon: Federico Rosario BROCKS, MD;  Location: [facility];  Service: Gastroenterology;  Laterality: N/A;   ESOPHAGOGASTRODUODENOSCOPY (EGD) WITH PROPOFOL  N/A 03/21/2015   Procedure: ESOPHAGOGASTRODUODENOSCOPY (EGD) WITH PROPOFOL ;  Surgeon: Lupita FORBES Commander, MD;  Location[facility] ENDOSCOPY;  Service: Endoscopy;  Laterality: N/A;   ESOPHAGOGASTRODUODENOSCOPY (EGD) WITH PROPOFOL  N/A 07/05/2019   Procedure: ESOPHAGOGASTRODUODENOSCOPY (EGD) WITH PROPOFOL ;  Surgeon: Aneita [name], MD;  Location: [facility];  Service: Endoscopy;  Laterality: N/A;    PARTIAL KNEE ARTHROPLASTY Right 08/30/2013   Procedure: RIGHT UNICOMPARTMENTAL KNEE;  Surgeon: Fonda SHAUNNA Olmsted, MD;  Location: [facility] OR;  Service: Orthopedics;  Laterality: Right;   PARTIAL KNEE ARTHROPLASTY Left 12/02/2013   Procedure: LEFT KNEE UNI ARTHROPLASTY;  Surgeon: Fonda SHAUNNA Olmsted, MD;  Location: [facility];  Service: Orthopedics;  Laterality: Left;   PEG PLACEMENT N/A 08/10/2015   Procedure: PERCUTANEOUS ENDOSCOPIC GASTROSTOMY (PEG) PLACEMENT;  Surgeon: Lupita FORBES Commander, MD;  Location: [facility] ENDOSCOPY;  Service: Endoscopy;  Laterality: N/A;   POLYPECTOMY  07/05/2019   Procedure: POLYPECTOMY;  Surgeon: Aneita [name], MD;  Location: [facility];  Service: Endoscopy;;   TUBAL LIGATION  1989   VAGINAL HYSTERECTOMY  1990's?   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) 06/02/2023   Idiopathic urticaria 04/15/2023   Knee pain 03/13/2023   Morbid obesity (HCC) 01/26/2023   Lumbar radiculopathy 12/03/2021   Positive ANA (antinuclear antibody) 08/26/2019   History of colonic polyps    Macromastia 06/17/2019   Seasonal allergies 02/18/2019   GAD (generalized anxiety disorder) 08/06/2018   Iron  deficiency anemia 07/31/2017   Osteoarthritis of left hip 02/18/2017   OSA on CPAP 09/11/2016   Restrictive lung disease 10/11/2015   Major depression    Headache, migraine    Essential hypertension    Splenomegaly 04/07/2015   Myasthenia gravis (HCC)    Osteoarthritis of left knee 12/02/2013   Benign neoplasm of descending colon 09/19/2013   Osteoarthritis of right knee 08/30/2013   GERD (gastroesophageal reflux disease) 06/23/2013   Fibroids 06/23/2013    PCP: Anders Otto DASEN, MD   REFERRING PROVIDER: Anders Otto DASEN, MD   REFERRING DIAG: [phone] (ICD-10-CM) - Primary osteoarthritis of left hip M54.16 (ICD-10-CM) - Lumbar radiculopathy   Rationale for Evaluation and Treatment: Rehabilitation  THERAPY DIAG:  Radiculopathy, lumbar region  Pain in left hip  Pain in right leg  ONSET DATE: 01/05/2024 date of referral  SUBJECTIVE:  SUBJECTIVE STATEMENT: 03/02/2024  Patient reports that she feels her back pain has been worsening. Her L hip pain continues to hurt very bad. Her pain is 10/10 without use of tylenol .   EVAL Patient reports to PT with chronic low back and and L>R hip pain. She describes having tenderness on L hip. She does have some numbness if she lays on her L side, as well as prolonged standing. She states that she occasionally has cramping in her toes. She has difficulty navigating her stairs at home. When I get to the last step, I have to throw my leg over. She also reports having difficulty with prolonged standing, and has to lean over the shopping cart to prevent numbness in her legs. She does have difficulty sleeping, and is often tossing-and-turning.   PERTINENT HISTORY:  Hx of BIL TKA Other relevant PMHx includes depression, DM, diverticulosis, myasthenia gravis, LE edema, obesity, anxiety  PAIN:  Are you having pain?  Yes: NPRS scale: 8/10, took tylenol  prior to our session Pain location: back, L>R hip  Pain description: cramping, tenderness Aggravating factors: prolonged standing, laying down   Relieving factors: tylenol , sitting after prolonged standing   PRECAUTIONS: None  RED FLAGS: None   WEIGHT BEARING RESTRICTIONS: No  FALLS:  Has patient fallen in last 6 months? Yes. Number of falls often losing her balance or tripping but I catch myself   LIVING ENVIRONMENT: Lives  with: lives with their family and lives with their spouse Lives in: House/apartment Stairs: Yes: Internal: 1 flight of steps; on right going up and can reach both and External: 5 steps; can reach both Has following equipment at home: Single point cane  OCCUPATION: n/a   PLOF: Independent with community mobility with device  PATIENT GOALS: Less pain with daily activities, ability to stand and lay down without severe pain.  Ability to ascend stairs without challenge  NEXT MD VISIT: none scheduled   OBJECTIVE:  Note: Objective measures were completed at Evaluation unless otherwise noted.  DIAGNOSTIC FINDINGS:  10/09/2023   IMPRESSION: 1. Mild osteoarthritis of the left hip. 2. Right hip joint space is preserved.  PATIENT SURVEYS:  Modified Oswestry:  MODIFIED OSWESTRY DISABILITY SCALE  Date: 02/05/2024  Score  Pain intensity 1 = The pain is bad, but I can manage without having to take pain medication  2. Personal care (washing, dressing, etc.) 1 =  I can take care of myself normally, but it increases my pain.  3. Lifting 3 = Pain prevents me from lifting heavy weights, but I can manage light to medium weights if they are conveniently positioned  4. Walking 1 = Pain prevents me from walking more than 1 mile.  5. Sitting 2 =  Pain prevents me from sitting more than 1 hour.  6. Standing 2 =  Pain prevents me from standing more than 1 hour  7. Sleeping 5 =  Pain prevents me from sleeping at all.  8. Social Life 3 =  Pain prevents me from going out very often.  9. Traveling 1 =  I can travel anywhere, but it increases my pain.  10. Employment/ Homemaking 2 = I can perform most of my homemaking/job duties, but pain prevents me from performing more physically stressful activities (eg, lifting, vacuuming).  Total 21/50   Interpretation of scores: Score Category Description  0-20% Minimal Disability The patient can cope with most living activities. Usually no treatment is indicated apart  from advice on lifting, sitting and exercise  21-40%  Moderate Disability The patient experiences more pain and difficulty with sitting, lifting and standing. Travel and social life are more difficult and they may be disabled from work. Personal care, sexual activity and sleeping are not grossly affected, and the patient can usually be managed by conservative means  41-60% Severe Disability Pain remains the main problem in this group, but activities of daily living are affected. These patients require a detailed investigation  61-80% Crippled Back pain impinges on all aspects of the patients life. Positive intervention is required  81-100% Bed-bound These patients are either bed-bound or exaggerating their symptoms  Bluford FORBES Zoe DELENA Karon DELENA, et al. Surgery versus conservative management of stable thoracolumbar fracture: the PRESTO feasibility RCT. [place] ([place]): [employer]; 2021 Nov. [employer] [facility] Technology Assessment, No. 25.62.) Appendix 3, Oswestry Disability Index category descriptors. Available from: Findjewelers.cz  Minimally Clinically Important Difference (MCID) = 12.8%  COGNITION: Overall cognitive status: Within functional limits for tasks assessed     SENSATION: Not tested  MUSCLE LENGTH: deferred  POSTURE: rounded shoulders and forward head  PALPATION: deferred  LUMBAR ROM:   AROM eval  Flexion 75%  Extension 75%  Right lateral flexion 50%  Left lateral flexion 50%  Right rotation 50%  Left rotation 50%   (Blank rows = not tested)  LOWER EXTREMITY ROM:     Active  Right eval Left eval  Hip flexion    Hip extension    Hip abduction    Hip adduction    Hip internal rotation    Hip external rotation    Knee flexion    Knee extension    Ankle dorsiflexion    Ankle plantarflexion    Ankle inversion    Ankle eversion     (Blank rows = not tested)  LOWER EXTREMITY MMT:    MMT Right eval Left eval  Hip flexion  4- 4-  Hip extension    Hip abduction 4- 4-  Hip adduction 3- 3-  Hip internal rotation    Hip external rotation    Knee flexion 5 5  Knee extension 5 5  Ankle dorsiflexion 4+ 4+  Ankle plantarflexion    Ankle inversion    Ankle eversion     (Blank rows = not tested)     TREATMENT DATE:   [facility] Adult PT Treatment:                                                DATE: 03/02/2024  Therapeutic exercise  Seated hip hinge 2x10 with 10# KB Ball roll out forward x 10  L/R pball roll x 10 each side  Neuromuscular Re-Education:  Patient education regarding pain modulation factors; additionally discussed use of home TENS unit, aquatic therapy consideration and possible pause from PT services until MD f/u.  Outcome: pt plans to call before next visit if TENS was helpful at home, aquatic PT to be scheduled 1x/week, alternating with land visits; if pain continues to increase following PT intervention with impaired sleep, we will pause PT until MD f/u  Discussed caution/contraindication/impact of self-weaning from anxiety and depression medications; discussed possible relationship with pain management. Encouraged patient to speak with MD/psychiatry about safe weaning if appropriate   PATIENT EDUCATION:  Education details: reviewed initial home exercise program; discussion of POC, prognosis and goals for skilled PT   Person educated: Patient Education  method: Explanation, Demonstration, and Handouts Education comprehension: verbalized understanding, returned demonstration, and needs further education  HOME EXERCISE PROGRAM: Access Code: [email or web address] URL: [email or web address] Date: 02/18/2024 Prepared by: Marko Molt  Exercises - Supine Pelvic Clock  - 1 x daily - 7 x weekly - 3 sets - 10 reps - Supine Transversus Abdominis Bracing - Hands on Stomach  - 1 x daily - 7 x weekly - 3 sets - 10 reps - Small Range Straight Leg Raise  - 1 x daily - 7 x weekly - 3 sets - 10 reps -  Seated Gluteal Sets  - 1 x daily - 7 x weekly - 3 sets - 10 reps - Seated Hip Flexion Toward Target  - 1 x daily - 7 x weekly - 3 sets - 10 reps - Standing Sidebends  - 1 x daily - 7 x weekly - 2 sets - 5-10 reps - 3 sec hold - Seated 3 Way Exercise Ball Roll Out Stretch  - 1 x daily - 7 x weekly - 2 sets - 5-10 reps - 3 sec hold - Abdominal Press into [place]  - 1 x daily - 7 x weekly - 2 sets - 5-10 reps - 3 sec hold  ASSESSMENT:  CLINICAL IMPRESSION: 03/02/2024  Patient continues to be significant limited by pain with exercises, positioning and has poor sleep quality. D/t report of symptom worsening, spent most of today's session with patient education and planning PT modifications. Patient encouraged to try home TENS unit before next session for pain modulation. Plan to review OP PT schedule next week and add aquatic therapy for improved tolerance and pain management. Patient in agreement.    EVAL Shye is a 63 y.o. female who was seen today for physical therapy evaluation and treatment for persistent low back pain and BIL hip pain. She is demonstrating diminished LS AROM with pain at end range of motion, and diminished BIL LE MMT scores. She also demonstrates decreased postural endurance with unsupported sitting. She has related pain and difficulty with prolonged standing, walking, laying down and performance of related ADLs/IADLs. She reported decreased pain severity with repeated lumbar flexion and repeated R side bending, and is encouraged to continue with these directions until f/u visit. She requires skilled PT services at this time to address relevant deficits and improve overall function.     OBJECTIVE IMPAIRMENTS: decreased activity tolerance, decreased ROM, decreased strength, improper body mechanics, postural dysfunction, and pain.   ACTIVITY LIMITATIONS: carrying, lifting, bending, standing, squatting, sleeping, stairs, and locomotion level  PARTICIPATION LIMITATIONS: meal prep,  cleaning, laundry, shopping, and community activity  PERSONAL FACTORS: Past/current experiences, Time since onset of injury/illness/exacerbation, 3+ comorbidities: Other relevant PMHx includes depression, DM, diverticulosis, myasthenia gravis, LE edema, obesity, anxiety, and hx of BIL TKA are also affecting patient's functional outcome.   REHAB POTENTIAL: Fair    CLINICAL DECISION MAKING: Evolving/moderate complexity  EVALUATION COMPLEXITY: Moderate   GOALS: Goals reviewed with patient? YES  SHORT TERM GOALS: Target date: 03/08/2024   Patient will be independent with initial home program at least 3 days/week.  Baseline: provided at eval Goal Status: INITIAL   2.  Patient will demonstrate improved postural awareness for at least 15 minutes while seated without need for cueing from PT.  Baseline: see objective measures Goal Status: INITIAL   3.  Patient will demonstrate improved Lumbar AROM to at least 75-80% of full range Baseline: see objective measures Goal Status: INITIAL      LONG TERM GOALS: Target  date: 04/05/2024    Patient will report improved overall functional ability with ODI score of 8/50 or less.  Baseline: 21/50 Goal Status: INITIAL    2.  Patient will demonstrate at least 4+/5 MMT  Baseline: see objective measures  Goal status: INITIAL    3.  Patient will demonstrate ability to perform floor to waist lifting of at least 25# using appropriate body mechanics and with no more than minimal pain in order to safely perform normal daily/occupational tasks.   Baseline: unable to tolerate  Goal Status: INITIAL    4.  Patient will report ability to sleep at least 6 hours/night with minimal pain-related sleep disturbance Baseline: I sleep less than 2 hours d/t pain  Goal status: INITIAL    PLAN:  PT FREQUENCY: 1-2x/week  PT DURATION: 8 weeks  PLANNED INTERVENTIONS: 97164- PT Re-evaluation, 97750- Physical Performance Testing, 97110-Therapeutic exercises,  97530- Therapeutic activity, [id number]- Neuromuscular re-education, 97535- Self Care, [id number]- Manual therapy, [id number]- Aquatic Therapy, H9716- Electrical stimulation (unattended), [id number]- Electrical stimulation (manual), 20560 (1-2 muscles), 20561 (3+ muscles)- Dry Needling, Patient/Family education, Joint mobilization, Cryotherapy, and Moist heat.  PLAN FOR NEXT SESSION: address lumbar mobility, LQ strengthening, low-impact aerobic activity, manual therapy and modalities for pain modulation as indicated; review and update HEP; onging patient education regarding current presentation/prognosis   Marko Molt, PT, DPT  03/02/2024 4:55 PM      "

## 2024-03-04 ENCOUNTER — Other Ambulatory Visit: Payer: Self-pay

## 2024-03-04 ENCOUNTER — Encounter: Payer: Self-pay | Admitting: Physician Assistant

## 2024-03-04 ENCOUNTER — Telehealth (HOSPITAL_COMMUNITY): Payer: Self-pay

## 2024-03-04 ENCOUNTER — Ambulatory Visit: Admitting: Physician Assistant

## 2024-03-04 ENCOUNTER — Ambulatory Visit: Payer: Self-pay | Admitting: Physician Assistant

## 2024-03-04 ENCOUNTER — Other Ambulatory Visit (HOSPITAL_COMMUNITY): Payer: Self-pay

## 2024-03-04 ENCOUNTER — Other Ambulatory Visit

## 2024-03-04 VITALS — BP 128/72 | HR 88 | Ht 60.0 in | Wt 301.1 lb

## 2024-03-04 DIAGNOSIS — K219 Gastro-esophageal reflux disease without esophagitis: Secondary | ICD-10-CM

## 2024-03-04 DIAGNOSIS — K7581 Nonalcoholic steatohepatitis (NASH): Secondary | ICD-10-CM | POA: Diagnosis not present

## 2024-03-04 DIAGNOSIS — E119 Type 2 diabetes mellitus without complications: Secondary | ICD-10-CM | POA: Diagnosis not present

## 2024-03-04 DIAGNOSIS — Z6841 Body Mass Index (BMI) 40.0 and over, adult: Secondary | ICD-10-CM

## 2024-03-04 DIAGNOSIS — Z860101 Personal history of adenomatous and serrated colon polyps: Secondary | ICD-10-CM

## 2024-03-04 DIAGNOSIS — D509 Iron deficiency anemia, unspecified: Secondary | ICD-10-CM | POA: Diagnosis not present

## 2024-03-04 DIAGNOSIS — Z7985 Long-term (current) use of injectable non-insulin antidiabetic drugs: Secondary | ICD-10-CM

## 2024-03-04 DIAGNOSIS — K8020 Calculus of gallbladder without cholecystitis without obstruction: Secondary | ICD-10-CM | POA: Diagnosis not present

## 2024-03-04 DIAGNOSIS — R1110 Vomiting, unspecified: Secondary | ICD-10-CM

## 2024-03-04 DIAGNOSIS — R1319 Other dysphagia: Secondary | ICD-10-CM

## 2024-03-04 LAB — HEPATIC FUNCTION PANEL
ALT: 9 U/L (ref 3–35)
AST: 13 U/L (ref 5–37)
Albumin: 4.3 g/dL (ref 3.5–5.2)
Alkaline Phosphatase: 92 U/L (ref 39–117)
Bilirubin, Direct: 0.1 mg/dL (ref 0.1–0.3)
Total Bilirubin: 0.4 mg/dL (ref 0.2–1.2)
Total Protein: 6.9 g/dL (ref 6.0–8.3)

## 2024-03-04 LAB — CBC WITH DIFFERENTIAL/PLATELET
Basophils Absolute: 0 K/uL (ref 0.0–0.1)
Basophils Relative: 0.2 % (ref 0.0–3.0)
Eosinophils Absolute: 0 K/uL (ref 0.0–0.7)
Eosinophils Relative: 0 % (ref 0.0–5.0)
HCT: 35.4 % — ABNORMAL LOW (ref 36.0–46.0)
Hemoglobin: 11 g/dL — ABNORMAL LOW (ref 12.0–15.0)
Lymphocytes Relative: 23.8 % (ref 12.0–46.0)
Lymphs Abs: 2.2 K/uL (ref 0.7–4.0)
MCHC: 31.2 g/dL (ref 30.0–36.0)
MCV: 67.2 fl — ABNORMAL LOW (ref 78.0–100.0)
Monocytes Absolute: 0.5 K/uL (ref 0.1–1.0)
Monocytes Relative: 5 % (ref 3.0–12.0)
Neutro Abs: 6.6 K/uL (ref 1.4–7.7)
Neutrophils Relative %: 71 % (ref 43.0–77.0)
Platelets: 285 K/uL (ref 150.0–400.0)
RBC: 5.27 Mil/uL — ABNORMAL HIGH (ref 3.87–5.11)
RDW: 19.1 % — ABNORMAL HIGH (ref 11.5–15.5)
WBC: 9.3 K/uL (ref 4.0–10.5)

## 2024-03-04 LAB — BASIC METABOLIC PANEL WITH GFR
BUN: 10 mg/dL (ref 6–23)
CO2: 31 meq/L (ref 19–32)
Calcium: 9.1 mg/dL (ref 8.4–10.5)
Chloride: 103 meq/L (ref 96–112)
Creatinine, Ser: 0.62 mg/dL (ref 0.40–1.20)
GFR: 95.19 mL/min
Glucose, Bld: 84 mg/dL (ref 70–99)
Potassium: 3.7 meq/L (ref 3.5–5.1)
Sodium: 143 meq/L (ref 135–145)

## 2024-03-04 LAB — IBC + FERRITIN
Ferritin: 32.4 ng/mL (ref 10.0–291.0)
Iron: 38 ug/dL — ABNORMAL LOW (ref 42–145)
Saturation Ratios: 9.7 % — ABNORMAL LOW (ref 20.0–50.0)
TIBC: 392 ug/dL (ref 250.0–450.0)
Transferrin: 280 mg/dL (ref 212.0–360.0)

## 2024-03-04 LAB — PROTIME-INR
INR: 1.2 ratio — ABNORMAL HIGH (ref 0.8–1.0)
Prothrombin Time: 12.8 s (ref 9.6–13.1)

## 2024-03-04 MED ORDER — DEXLANSOPRAZOLE 60 MG PO CPDR
60.0000 mg | DELAYED_RELEASE_CAPSULE | Freq: Every day | ORAL | 3 refills | Status: AC
  Filled 2024-03-04: qty 90, 90d supply, fill #0

## 2024-03-04 NOTE — Telephone Encounter (Signed)
 Pharmacy Patient Advocate Encounter   Received notification from Patient Pharmacy that prior authorization for Dexlansoprazole 60MG  dr capsules  is required/requested.   Insurance verification completed.   The patient is insured through [employer].   Per test claim: PA required; PA submitted to above mentioned insurance via Latent Key/confirmation #/EOC [id number] Status is pending

## 2024-03-04 NOTE — Patient Instructions (Signed)
 Your provider has requested that you go to the basement level for lab work before leaving today. Press B on the elevator. The lab is located at the first door on the left as you exit the elevator.  -stop nexium , trial of dexilant 60 mg with pepcid  at night  Follow the instructions on the Hemoccult cards and mail them back to us  when you are finished or you may take them directly to the lab in the basement of the [place] building. We will call you with the results.   Case number [id number] You have been scheduled for an esophageal manometry and 24 hour PH Probe test at [facility] on 06-08-24 at 1230pm. Please arrive 30 minutes prior to your procedure for registration. You will need to go to outpatient registration (1st floor of the hospital) first. Make certain to bring your insurance cards as well as a complete list of medications.  Please remember the following:  1) Do not take any muscle relaxants, xanax (alprazolam) or ativan  for 1 day prior to your test as well as the day of the test.  2) Nothing to eat or drink after 12:00 midnight on the night before your test.  3) Hold all diabetic medications/insulin  the morning of the test. You may eat and take your medications after the test.  4) For 7 days prior to your test do not take: Dexilant, Prevacid, Nexium , Protonix , Aciphex, Zegerid, Pantoprazole , Prilosec or omeprazole .  5) For 7 days prior to your test, do not take: Reglan , Tagamet, Zantac , Axid or Pepcid .  6) You MAY use an antacid such as Rolaids or Tums up to 12 hours prior to your test.  It will take at least 2 weeks to receive the results of this test from your physician.  ------------------------------------------ ABOUT ESOPHAGEAL MANOMETRY Esophageal manometry (muh-NOM-uh-tree) is a test that gauges how well your esophagus works. Your esophagus is the long, muscular tube that connects your throat to your stomach. Esophageal manometry measures the rhythmic muscle  contractions (peristalsis) that occur in your esophagus when you swallow. Esophageal manometry also measures the coordination and force exerted by the muscles of your esophagus.  During esophageal manometry, a thin, flexible tube (catheter) that contains sensors is passed through your nose, down your esophagus and into your stomach. Esophageal manometry can be helpful in diagnosing some mostly uncommon disorders that affect your esophagus.  Why it's done Esophageal manometry is used to evaluate the movement (motility) of food through the esophagus and into the stomach. The test measures how well the circular bands of muscle (sphincters) at the top and bottom of your esophagus open and close, as well as the pressure, strength and pattern of the wave of esophageal muscle contractions that moves food along.  What you can expect Esophageal manometry is an outpatient procedure done without sedation. Most people tolerate it well. You may be asked to change into a hospital gown before the test starts.  During esophageal manometry  While you are sitting up, a member of your health care team sprays your throat with a numbing medication or puts numbing gel in your nose or both.  A catheter is guided through your nose into your esophagus. The catheter may be sheathed in a water -filled sleeve. It doesn't interfere with your breathing. However, your eyes may water , and you may gag. You may have a slight nosebleed from irritation.  After the catheter is in place, you may be asked to lie on your back on an exam table, or  you may be asked to remain seated.  You then swallow small sips of water . As you do, a computer connected to the catheter records the pressure, strength and pattern of your esophageal muscle contractions.  During the test, you'll be asked to breathe slowly and smoothly, remain as still as possible, and swallow only when you're asked to do so.  A member of your health care team may move the catheter  down into your stomach while the catheter continues its measurements.  The catheter then is slowly withdrawn. The test usually lasts 20 to 30 minutes.  After esophageal manometry  When your esophageal manometry is complete, you may return to your normal activities  This test typically takes 30-45 minutes to complete. ________________________________________________________________________________  ABOUT 24 HOUR PH PROBE An esophageal pH test measures and records the pH in your esophagus to determine if you have gastroesophageal reflux disease (GERD). The test can also be done to determine the effectiveness of medications or surgical treatment for GERD. What is esophageal reflux? Esophageal reflux is a condition in which stomach acid refluxes or moves back into the esophagus (the food pipe leading from the mouth to the stomach). How does the esophageal pH test work? A thin, small tube with an acid sensing device on the tip is gently passed through your nose, down the esophagus (food tube), and positioned about 2 inches above the lower esophageal sphincter. The tube is secured to the side of your face with clear tape. The end of the tube exiting from your nose is attached to a portable recorder that is worn on your belt or over your shoulder. The recorder has several buttons on it that you will press to mark certain events. A nurse will review the monitoring instructions with you. Once the test has begun, what do I need to know and do? Activity: Follow your usual daily routine. Do not reduce or change your activities during the monitoring period. Doing so can make the monitoring results less useful.  Note: do not take a tub bath or shower; the equipment cant get wet.  Eating: Eat your regular meals at the usual times. If you do not eat during the monitoring period, your stomach will not produce acid as usual, and the test results will not be accurate. Eat at least 2 meals a day. Eat foods that  tend to increase your symptoms (without making yourself miserable). Avoid snacking. Do not suck on hard candy or lozenges and do not chew gum during the monitoring period.  Lying down: Remain upright throughout the day. Do not lie down until you go to bed (unless napping or lying down during the day is part of your daily routine).  Medications: Continue to follow your doctors advice regarding medications to avoid during the monitoring period.  Recording symptoms: Press the appropriate button on your recorder when symptoms occur (as discussed with the nurse).  Recording events: Record the time you start and stop eating and drinking (anything other than plain water ). Record the time you lie down (even if just resting) and when you get back up. The nurse will explain this.  Unusual symptoms or side effects. If you think you may be experiencing any unusual symptoms or side effects, call your doctor.  You will return the next day to have the tube removed. The information on the recorder will be downloaded to a computer and the results will be analyzed.  After completion of the study Resume your normal diet and medications. Lozenges or  hard candy may help ease any sore throat caused by the tube.    VISIT SUMMARY:  You visited us  today to follow up on your persistent reflux symptoms. We discussed your ongoing issues with heartburn, regurgitation, and difficulty swallowing, as well as your liver health, weight management, and potential iron  deficiency anemia.  YOUR PLAN:  ESOPHAGEAL DYSPHAGIA WITH REGURGITATION AND GASTROESOPHAGEAL REFLUX DISEASE: You have ongoing difficulty swallowing and persistent reflux symptoms despite previous medication. -We have stopped your current medication, Nexium , and started you on a new medication, Dexilant. -You were given information on a diet to help manage gastroparesis. -We have scheduled an esophageal manometry with a pH study to further investigate your  symptoms.  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 -Continue with your weight loss efforts and GLP-1 agonist therapy. -Follow a Mediterranean diet to help manage your liver condition. -We have ordered a liver ultrasound for March  -You need to be monitored closely every six months.  MORBID OBESITY, BMI 60.0-69.9: You have severe obesity, and ongoing weight loss is crucial for your overall health and liver condition. -Continue taking your current medication, Mounjaro . -Keep working on your weight loss efforts. -Follow the dietary recommendations provided, including the gastroparesis and Mediterranean diets.  IRON  DEFICIENCY ANEMIA: You have a history of iron  deficiency anemia and need to check for ongoing blood loss and update your iron  levels. -We provided you with Hemoccult cards to check for hidden blood in your stool. -We have ordered blood tests to check your iron  and ferritin levels.  Gastroparesis Gastroparesis is a condition in which food takes longer than normal to empty from the stomach.  This condition is also known as delayed gastric emptying. It is usually a long-term (chronic) condition.  What are the signs or symptoms? Symptoms of this condition include: Feeling full after eating very little or a loss of appetite. Nausea, vomiting, or heartburn. Bloating of your abdomen. Inconsistent blood sugar (glucose) levels on blood tests. Unexplained weight loss. Acid from the stomach coming up into the esophagus (gastroesophageal reflux). Sudden tightening (spasm) of the stomach, which can be painful. Symptoms may come and go. Some people may not notice any symptoms.  What increases the risk? You are more likely to develop this condition if: You have certain disorders or diseases. These may include: An endocrine disorder. An eating  disorder. Amyloidosis. Scleroderma. Parkinson's disease. Multiple sclerosis. Cancer or infection of the stomach or the vagus nerve. You have had surgery on your stomach or vagus nerve. You take certain medicines. You are female.  Things you can do: Please do small frequent meals like 4-6 meals a day.  Eat and drink liquids at separate times.  Avoid high fiber foods, cook your vegetables, avoid high fat food.  Suggest spreading protein throughout the day (greek yogurt, glucerna, soft meat, milk, eggs) Choose soft foods that you can mash with a fork When you are more symptomatic, change to pureed foods foods and liquids.  Consider reading Living well with Gastroparesis by [facility] Check out this link to a diet online https://my.groupjournal.fr  Metabolic dysfunction associated seatohepatitis  Now the leading cause of liver failure in the [place] .  It is normally from such risk factors as obesity, diabetes, insulin  resistance, high cholesterol, or metabolic syndrome.  The only definitive therapy is weight loss and exercise.   Suggest walking 20-30 mins daily.  Decreasing carbohydrates, increasing veggies.    Fatty Liver Fatty liver is the  accumulation of fat in liver cells. It is also called hepatosteatosis or steatohepatitis. It is normal for your liver to contain some fat. If fat is more than 5 to 10% of your liver's weight, you have fatty liver.  There are often no symptoms (problems) for years while damage is still occurring. People often learn about their fatty liver when they have medical tests for other reasons. Fat can damage your liver for years or even decades without causing problems. When it becomes severe, it can cause fatigue, weight loss, weakness, and confusion. This makes you more likely to develop more serious liver problems. The liver is the largest organ in the  body. It does a lot of work and often gives no warning signs when it is sick until late in a disease. The liver has many important jobs including: Breaking down foods. Storing vitamins, iron , and other minerals. Making proteins. Making bile for food digestion. Breaking down many products including medications, alcohol and some poisons.  PROGNOSIS  Fatty liver may cause no damage or it can lead to an inflammation of the liver. This is, called steatohepatitis.  Over time the liver may become scarred and hardened. This condition is called cirrhosis. Cirrhosis is serious and may lead to liver failure or cancer. NASH is one of the leading causes of cirrhosis. About 10-20% of Americans have fatty liver and a smaller 2-5% has NASH.  TREATMENT  Weight loss, fat restriction, and exercise in overweight patients produces inconsistent results but is worth trying. Good control of diabetes may reduce fatty liver. Eat a balanced, healthy diet. Increase your physical activity. There are no medical or surgical treatments for a fatty liver or NASH, but improving your diet and increasing your exercise may help prevent or reverse some of the damage.

## 2024-03-07 ENCOUNTER — Other Ambulatory Visit (HOSPITAL_COMMUNITY): Payer: Self-pay

## 2024-03-07 ENCOUNTER — Other Ambulatory Visit: Payer: Self-pay

## 2024-03-07 NOTE — Telephone Encounter (Signed)
 Pharmacy Patient Advocate Encounter  Received notification from [facility] that Prior Authorization for  Dexlansoprazole  60MG  dr capsules  has been APPROVED from 03/05/24 to 03/04/25. Ran test claim, Copay is $197.36. This test claim was processed through [facility]- copay amounts may vary at other pharmacies due to pharmacy/plan contracts, or as the patient moves through the different stages of their insurance plan.   PA #/Case ID/Reference #: [id number]

## 2024-03-10 ENCOUNTER — Ambulatory Visit

## 2024-03-13 ENCOUNTER — Ambulatory Visit (HOSPITAL_COMMUNITY)

## 2024-03-13 ENCOUNTER — Other Ambulatory Visit (HOSPITAL_COMMUNITY): Payer: Self-pay

## 2024-03-13 ENCOUNTER — Ambulatory Visit (HOSPITAL_COMMUNITY)
Admission: EM | Admit: 2024-03-13 | Discharge: 2024-03-13 | Disposition: A | Discharge status: Home / Self Care | Attending: Physician Assistant | Admitting: Physician Assistant

## 2024-03-13 ENCOUNTER — Encounter (HOSPITAL_COMMUNITY): Payer: Self-pay

## 2024-03-13 DIAGNOSIS — J101 Influenza due to other identified influenza virus with other respiratory manifestations: Secondary | ICD-10-CM

## 2024-03-13 DIAGNOSIS — J4531 Mild persistent asthma with (acute) exacerbation: Secondary | ICD-10-CM

## 2024-03-13 DIAGNOSIS — R051 Acute cough: Secondary | ICD-10-CM

## 2024-03-13 LAB — POCT INFLUENZA A/B
Influenza A, POC: POSITIVE — AB
Influenza B, POC: NEGATIVE

## 2024-03-13 LAB — POC SOFIA SARS ANTIGEN FIA: SARS Coronavirus 2 Ag: NEGATIVE

## 2024-03-13 MED ORDER — IPRATROPIUM-ALBUTEROL 0.5-2.5 (3) MG/3ML IN SOLN
RESPIRATORY_TRACT | Status: AC
  Filled 2024-03-13: qty 3

## 2024-03-13 MED ORDER — PREDNISONE 10 MG PO TABS
ORAL_TABLET | ORAL | 0 refills | Status: AC
  Filled 2024-03-13: qty 21, 6d supply, fill #0

## 2024-03-13 MED ORDER — OSELTAMIVIR PHOSPHATE 75 MG PO CAPS
75.0000 mg | ORAL_CAPSULE | Freq: Two times a day (BID) | ORAL | 0 refills | Status: AC
  Filled 2024-03-13: qty 10, 5d supply, fill #0

## 2024-03-13 MED ORDER — ALBUTEROL SULFATE (2.5 MG/3ML) 0.083% IN NEBU
2.5000 mg | INHALATION_SOLUTION | Freq: Four times a day (QID) | RESPIRATORY_TRACT | 0 refills | Status: AC | PRN
  Filled 2024-03-13: qty 75, 7d supply, fill #0

## 2024-03-13 MED ORDER — IPRATROPIUM-ALBUTEROL 0.5-2.5 (3) MG/3ML IN SOLN
3.0000 mL | Freq: Once | RESPIRATORY_TRACT | Status: AC
  Administered 2024-03-13: 3 mL via RESPIRATORY_TRACT

## 2024-03-13 MED ORDER — ACETAMINOPHEN 325 MG PO TABS
ORAL_TABLET | ORAL | Status: AC
  Filled 2024-03-13: qty 3

## 2024-03-13 MED ORDER — ACETAMINOPHEN 325 MG PO TABS
975.0000 mg | ORAL_TABLET | Freq: Once | ORAL | Status: AC
  Administered 2024-03-13: 975 mg via ORAL

## 2024-03-13 NOTE — ED Provider Notes (Signed)
 " [facility]    CSN: [id number] Arrival date & time: 03/13/24  9094      History   Chief Complaint Chief Complaint  Patient presents with   Cough    HPI Frances Medina is a 64 y.o. female.   Patient presents today with a 2 to 3-day history of URI symptoms including headache, body aches, cough, shortness of breath.  She reports that cough is productive.  She has tried Robitussin without improvement of symptoms.  Denies any known sick contacts.  She has not had COVID or flu recently.  She does have a history of myasthenia gravis and reports that while she is short of breath she does not believe that she is going into a crisis.  She does take immunosuppression medications to treat MG (CellCept ).  She also has a history of asthma and has been using her albuterol  as well as Wixela.  Her last dose of rescue medication was yesterday evening.  She has felt the need to use these medications more frequently since her symptoms began.  Denies any recent antibiotics or steroids.  Reports that she is feeling really poorly prompting evaluation today.    Past Medical History:  Diagnosis Date   Acute respiratory failure (HCC)    Acute respiratory failure with hypoxemia (HCC)    Aspiration into airway    Back pain 07/31/2017   Benign neoplasm of rectum    CAP (community acquired pneumonia) 08/05/2015   Depression with anxiety    Diabetes mellitus, new onset (HCC) 06/02/2023   Diverticulosis    Dizziness 03/13/2023   Dysphagia 03/2015   EGD, Dr Avram. mild antral gastritis, ? due to Ibuprofen .  no stricture but empirically maloney dilated esophagus.    Dysphagia, neurologic    E. 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   Gastrostomy  infection (HCC) 11/08/2015   GERD (gastroesophageal reflux disease)    hiatal hernia   Hypertension    went away when I stopped smoking   Knee injury    Left hip pain 02/18/2017   Migraine    maybe couple times/month (03/20/2015)   Mild intermittent asthma 06/22/2017   Myasthenia gravis (HCC) 2017   Myasthenia gravis with acute exacerbation (HCC) 05/15/2015   Osteoarthritis of left knee 12/02/2013   Osteoarthritis of right knee 08/30/2013   Pancreatitis 07/25/2017   Protein-calorie malnutrition, severe 08/07/2015   Seasonal allergies    takes Zytrec   Shortness of breath    Sinusitis 02/25/2018   Sleep apnea    with cPAp machine   Tracheostomy status (HCC)    Tubular adenoma of colon    Tumors    in my stomach   Umbilical hernia    watching , no plans for surgery at present   Urine incontinence 10/19/2015   Urticaria    VAP (ventilator-associated pneumonia) [facility])     Patient Active Problem List   Diagnosis Date Noted   Leg edema 09/29/2023   Adverse food reaction 08/20/2023   Chronic rhinitis 08/20/2023   Left shoulder pain 07/06/2023   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) 06/02/2023   Idiopathic urticaria 04/15/2023   Knee pain 03/13/2023   Morbid obesity (HCC) 01/26/2023   Lumbar  radiculopathy 12/03/2021   Positive ANA (antinuclear antibody) 08/26/2019   History of colonic polyps    Macromastia 06/17/2019   Seasonal allergies 02/18/2019   GAD (generalized anxiety disorder) 08/06/2018   Iron  deficiency anemia 07/31/2017   Osteoarthritis of left hip 02/18/2017   OSA on CPAP 09/11/2016   Restrictive lung disease 10/11/2015   Major depression    Headache, migraine    Essential hypertension    Splenomegaly 04/07/2015   Myasthenia gravis (HCC)    Osteoarthritis of left knee 12/02/2013   Benign neoplasm of descending colon 09/19/2013   Osteoarthritis of right knee 08/30/2013   GERD (gastroesophageal reflux disease) 06/23/2013    Fibroids 06/23/2013    Past Surgical History:  Procedure Laterality Date   BIOPSY  07/05/2019   Procedure: BIOPSY;  Surgeon: Aneita [name], MD;  Location: [facility];  Service: Endoscopy;;   CESAREAN SECTION  1987; 1989   COLONOSCOPY N/A 09/22/2023   Procedure: COLONOSCOPY;  Surgeon: Federico Rosario BROCKS, MD;  Location: [facility];  Service: Gastroenterology;  Laterality: N/A;   COLONOSCOPY WITH PROPOFOL  N/A 09/19/2013   Procedure: COLONOSCOPY WITH PROPOFOL ;  Surgeon: [name] Aneita, MD;  Location: [facility];  Service: Endoscopy;  Laterality: N/A;   COLONOSCOPY WITH PROPOFOL  N/A 07/05/2019   Procedure: COLONOSCOPY WITH PROPOFOL ;  Surgeon: Aneita [name], MD;  Location: [facility];  Service: Endoscopy;  Laterality: N/A;   DILATION AND CURETTAGE OF UTERUS     ESOPHAGOGASTRODUODENOSCOPY N/A 08/10/2015   Procedure: ESOPHAGOGASTRODUODENOSCOPY (EGD);  Surgeon: Lupita FORBES Commander, MD;  Location: [facility] END[facility]COPY;  Service: Endoscopy;  Laterality: N/A;   ESOPHAGOGASTRODUODENOSCOPY N/A 09/22/2023   Procedure: EGD (ESOPHAGOGASTRODUODENOSCOPY);  Surgeon: Federico Rosario BROCKS, MD;  Location: [facility];  Service: Gastroenterology;  Laterality: N/A;   ESOPHAGOGASTRODUODENOSCOPY (EGD) WITH PROPOFOL  N/A 03/21/2015   Procedure: ESOPHAGOGASTRODUODENOSCOPY (EGD) WITH PROPOFOL ;  Surgeon: Lupita FORBES Commander, MD;  Location: [facility]COPY;  Service: Endoscopy;  Laterality: N/A;   ESOPHAGOGASTRODUODENOSCOPY (EGD) WITH PROPOFOL  N/A 07/05/2019   Procedure: ESOPHAGOGASTRODUODENOSCOPY (EGD) WITH PROPOFOL ;  Surgeon: Aneita [name], MD;  Location: [facility];  Service: Endoscopy;  Laterality: N/A;   PARTIAL KNEE ARTHROPLASTY Right 08/30/2013   Procedure: RIGHT UNICOMPARTMENTAL KNEE;  Surgeon: Fonda SHAUNNA Olmsted, MD;  Location: [facility];  Service: Orthopedics;  Laterality: Right;   PARTIAL KNEE ARTHROPLASTY Left 12/02/2013   Procedure: LEFT KNEE UNI ARTHROPLASTY;  Surgeon: Fonda SHAUNNA Olmsted, MD;  Location: [facility];  Service:  Orthopedics;  Laterality: Left;   PEG PLACEMENT N/A 08/10/2015   Procedure: PERCUTANEOUS ENDOSCOPIC GASTROSTOMY (PEG) PLACEMENT;  Surgeon: Lupita FORBES Commander, MD;  Location: [facility] ENDOSCOPY;  Service: Endoscopy;  Laterality: N/A;   POLYPECTOMY  07/05/2019   Procedure: POLYPECTOMY;  Surgeon: Aneita [name], MD;  Location: [facility];  Service: Endoscopy;;   TUBAL LIGATION  1989   VAGINAL HYSTERECTOMY  1990's?   apparently took out one of my ovaries at the time too cause one's missing    OB History   No obstetric history on file.      Home Medications    Prior to Admission medications  Medication Sig Start Date End Date Taking? Authorizing Provider  oseltamivir  (TAMIFLU ) 75 MG capsule Take 1 capsule (75 mg total) by mouth every 12 (twelve) hours. 03/13/24  Yes Casen Pryor K, PA-C  predniSONE  (STERAPRED UNI-PAK 21 TAB) 10 MG (21) TBPK tablet Take 40 mg (4 tablets) daily for days 1 and 2, take 30 mg (3 tablets) daily for days 3 and 4, take 20 mg (  2 tablets) daily for days 5 and 6, take 10 mg (1 tablet) daily days 7 and 8, take 5 mg (0.5 tablet) daily days 9 and 10, then stop. 03/13/24  Yes Reace Breshears K, PA-C  Accu-Chek Softclix Lancets lancets Use as instructed 07/07/23   McDiarmid, Krystal BIRCH, MD  acetaminophen  (TYLENOL ) 500 MG tablet Take 500 mg by mouth in the morning and at bedtime.    [provider]  albuterol  (PROVENTIL ) (2.5 MG/3ML) 0.083% nebulizer solution Take 3 mLs (2.5 mg total) by nebulization every 6 (six) hours as needed for wheezing or shortness of breath. 03/13/24   Dara Camargo K, PA-C  albuterol  (VENTOLIN  HFA) 108 (90 Base) MCG/ACT inhaler Inhale 2 puffs into the lungs every 6 (six) hours as needed for wheezing or shortness of breath 12/18/23   Anders Otto DASEN, MD  APPLE CIDER VINEGAR PO Take 1 capsule by mouth daily.    [provider]  Ascorbic Acid (VITAMIN C PO) Take 1,000 mg by mouth daily.    [provider]  azelastine  (ASTELIN ) 0.1 % nasal spray  Place 1 spray into both nostrils 2 (two) times daily. Use in each nostril as directed 02/02/24   Anders Otto DASEN, MD  Blood Glucose Monitoring Suppl (ACCU-CHEK GUIDE ME) w/Device KIT Use as directed. 07/07/23   McDiarmid, Krystal BIRCH, MD  clonazePAM  (KLONOPIN ) 0.5 MG tablet TAKE 1 TABLET BY MOUTH TWICE A DAY AS NEEDED FOR ANXIETY 01/20/24   Anders Otto DASEN, MD  Continuous Glucose Sensor (FREESTYLE LIBRE 3 PLUS SENSOR) MISC Change sensor every 15 days. Patient taking differently: as needed. Change sensor every 15 days. 06/03/23   McDiarmid, Krystal BIRCH, MD  Cyanocobalamin  (VITAMIN B12 PO) Take 1 tablet by mouth daily.    [provider]  dexlansoprazole  (DEXILANT ) 60 MG capsule Take 1 capsule (60 mg total) by mouth daily. 03/04/24   Craig Alan SAUNDERS, PA-C  EPINEPHrine  0.3 mg/0.3 mL IJ SOAJ injection Inject 0.3 mg into the muscle as needed for anaphylaxis. 05/06/23   Cheryl Reusing, FNP  famotidine  (PEPCID ) 20 MG tablet Take 1 tablet (20 mg total) by mouth 2 (two) times daily as needed for heartburn or indigestion (hives). Patient taking differently: Take 20 mg by mouth 2 (two) times daily. 10/21/23   Tobie Arleta SQUIBB, MD  fluticasone  (FLONASE ) 50 MCG/ACT nasal spray Place 2 sprays into both nostrils daily. 10/21/23   Tobie Arleta SQUIBB, MD  glucose blood (ACCU-CHEK SMARTVIEW) test strip Use as instructed Patient taking differently: 1 each as needed. Use as instructed 07/07/23   McDiarmid, Krystal BIRCH, MD  halobetasol  (ULTRAVATE ) 0.05 % cream Apply topically 2 (two) times daily. 01/05/24   Anders Otto DASEN, MD  hydrOXYzine  (ATARAX ) 10 MG tablet Take 1 tablet (10 mg total) by mouth at bedtime as needed for itching (or hives). 12/01/23   Tobie Arleta SQUIBB, MD  Lancets Misc. (ACCU-CHEK SOFTCLIX LANCET DEV) KIT Use as directed 07/07/23   McDiarmid, Krystal BIRCH, MD  lisinopril  (ZESTRIL ) 5 MG tablet Take 1 tablet (5 mg total) by mouth at bedtime. 02/02/24   Anders Otto DASEN, MD  Menthol  10 MG LOZG Take 1 lozenge by mouth daily as needed  (cough).    [provider]  montelukast  (SINGULAIR ) 10 MG tablet Take 1 tablet (10 mg total) by mouth at bedtime. 10/21/23   Tobie Arleta SQUIBB, MD  mycophenolate  (CELLCEPT ) 500 MG tablet Take 500 mg by mouth 2 (two) times daily. 01/15/22   [provider]  nystatin  (MYCOSTATIN /NYSTOP ) powder  APPLY TOPICALLY 3 TIMES DAILY AS NEEDED. 11/06/22   Anders Otto DASEN, MD  Remibrutinib (RHAPSIDO) 25 MG TABS Take 1 tablet by mouth in the morning and at bedtime.    [provider]  sertraline  (ZOLOFT ) 50 MG tablet Take 3 tablets (150 mg total) by mouth daily. 02/22/24   Anders Otto DASEN, MD  tirzepatide  (MOUNJARO ) 7.5 MG/0.5ML Pen Inject 7.5 mg into the skin once a week. 02/22/24   Anders Otto DASEN, MD  VITAMIN D , CHOLECALCIFEROL, PO Take 1 tablet by mouth daily. otc    [provider]  NAPOLEON INHUB 250-50 MCG/ACT AEPB INHALE 1 PUFF INTO THE LUNGS IN THE MORNING AND AT BEDTIME. 07/29/23   Kara Dorn NOVAK, MD    Family History Family History  Problem Relation Age of Onset   Heart disease Mother 5   Hypertension Mother    Stroke Father    Hypertension Father    Atopy Sister    Allergic rhinitis Sister    Other Sister        Esophageal strictures   Allergic rhinitis Sister    Anemia Sister    Leukemia Sister    Leukemia Sister    COPD Brother    Colon cancer Neg Hx     Social History Social History[1]   Allergies   Ciprofloxacin , Naproxen , Shrimp [shellfish allergy ], Dicyclomine, Methocarbamol , Pineapple, and Morphine  and codeine    Review of Systems Review of Systems  Constitutional:  Positive for activity change and fatigue. Negative for appetite change and fever.  HENT:  Positive for congestion and sore throat. Negative for sinus pressure and sneezing.   Respiratory:  Positive for cough, shortness of breath and wheezing. Negative for chest tightness.   Cardiovascular:  Negative for chest pain.  Gastrointestinal:  Negative for abdominal pain,  diarrhea, nausea and vomiting.  Musculoskeletal:  Positive for arthralgias and myalgias.  Neurological:  Positive for headaches. Negative for dizziness and light-headedness.     Physical Exam Triage Vital Signs ED Triage Vitals  Encounter Vitals Group     BP 03/13/24 1047 (!) 167/78     Girls Systolic BP Percentile --      Girls Diastolic BP Percentile --      Boys Systolic BP Percentile --      Boys Diastolic BP Percentile --      Pulse Rate 03/13/24 1047 87     Resp 03/13/24 1047 20     Temp 03/13/24 1047 99.2 F (37.3 C)     Temp Source 03/13/24 1047 Oral     SpO2 03/13/24 1047 93 %     Weight --      Height --      Head Circumference --      Peak Flow --      Pain Score 03/13/24 1046 10     Pain Loc --      Pain Education --      Exclude from Growth Chart --    No data found.  Updated Vital Signs BP (!) 167/78 (BP Location: Right Wrist)   Pulse 87   Temp 99.2 F (37.3 C) (Oral)   Resp 20   SpO2 93%   Visual Acuity Right Eye Distance:   Left Eye Distance:   Bilateral Distance:    Right Eye Near:   Left Eye Near:    Bilateral Near:     Physical Exam Vitals reviewed.  Constitutional:      General: She is awake. She is not in acute  distress.    Appearance: Normal appearance. She is well-developed. She is not ill-appearing.     Comments: Very pleasant female appears stated age in no acute distress sitting comfortable in exam room  HENT:     Head: Normocephalic and atraumatic.     Right Ear: Tympanic membrane, ear canal and external ear normal. Tympanic membrane is not erythematous or bulging.     Left Ear: Tympanic membrane, ear canal and external ear normal. Tympanic membrane is not erythematous or bulging.     Nose:     Right Sinus: No maxillary sinus tenderness or frontal sinus tenderness.     Left Sinus: No maxillary sinus tenderness or frontal sinus tenderness.     Mouth/Throat:     Pharynx: Uvula midline. Posterior oropharyngeal erythema present. No  oropharyngeal exudate.  Cardiovascular:     Rate and Rhythm: Normal rate and regular rhythm.     Heart sounds: Normal heart sounds, S1 normal and S2 normal. No murmur heard. Pulmonary:     Effort: Pulmonary effort is normal.     Breath sounds: Wheezing present. No rhonchi or rales.     Comments: Widespread wheezing Psychiatric:        Behavior: Behavior is cooperative.      UC Treatments / Results  Labs (all labs ordered are listed, but only abnormal results are displayed) Labs Reviewed  POCT INFLUENZA A/B - Abnormal; Notable for the following components:      Result Value   Influenza A, POC Positive (*)    All other components within normal limits  POC SOFIA SARS ANTIGEN FIA    EKG   Radiology DG Chest 2 View Result Date: 03/13/2024 CLINICAL DATA:  Cough. EXAM: DG CHEST 2V COMPARISON:  August 12, 2023 FINDINGS: The heart size and mediastinal contours are within normal limits. Mild, stable linear atelectasis is seen within the left lung base. No focal consolidation, pleural effusion or pneumothorax is identified. Multilevel degenerative changes are present throughout the thoracic spine. IMPRESSION: No active cardiopulmonary disease. Electronically Signed   By: Suzen Dials M.D.   On: 03/13/2024 11:58    Procedures Procedures (including critical care time)  Medications Ordered in UC Medications  acetaminophen  (TYLENOL ) tablet 975 mg (975 mg Oral Given 03/13/24 1113)  ipratropium-albuterol  (DUONEB) 0.5-2.5 (3) MG/3ML nebulizer solution 3 mL (3 mLs Nebulization Given 03/13/24 1114)    Initial Impression / Assessment and Plan / UC Course  I have reviewed the triage vital signs and the nursing notes.  Pertinent labs & imaging results that were available during my care of the patient were reviewed by me and considered in my medical decision making (see chart for details).     Patient is well-appearing, afebrile, nontoxic, nontachycardic.  She does do positive for influenza  A and given she has been than 72 hours of symptom onset will start Tamiflu  twice daily for 5 days.  No indication for dose adjustment based on metabolic panel from 03/04/2024 with creatinine of 0.62 and eGFR greater than 90 mL/min.  Chest x-ray was obtained that showed no acute cardiopulmonary disease so will defer antibiotics.  She was wheezing on exam and given DuoNeb with improvement of symptoms.  Given her ongoing wheezing and shortness of breath I suspect that flu has triggered her asthma.  She was provided a refill of her albuterol  nebulizer solution and encouraged to use this every 4-6 hours as needed.  Will continue her maintenance medication as previously prescribed.  We will start a prednisone  taper  and we discussed that generally prednisone  helps manage myasthenia gravis but there is a small subset of patients who can have an exacerbation after prednisone  and so if she has any muscle weakness or shortness of breath she is to go to the ER.  Recommended over-the-counter medication including Tylenol  and Mucinex .  Discussed that she should follow-up with her primary care within a few days if her symptoms are not improving.  Given her complicated medical history we discussed that if she develops any high fever, persistent or worsening shortness of breath, chest pain, generalized weakness she should go to the ER immediately.  Return precautions given.  Excuse note provided.  Final Clinical Impressions(s) / UC Diagnoses   Final diagnoses:  Acute cough  Influenza A  Mild persistent asthma with acute exacerbation     Discharge Instructions      You tested positive for influenza A.  Please start Tamiflu  twice daily for 5 days.  I am concerned this is triggered your asthma.  Continue using your albuterol  every 4-6 hours as needed and take your Wixela twice daily.  Start prednisone  taper as prescribed.  Do not take NSAIDs with this medication including aspirin, ibuprofen /Advil , naproxen /Aleve .  Normally  this helps with breathing issues associated with myasthenia gravis but in some patients it can actually make it worse or if you have any increasing shortness of breath or muscle weakness you need to go to the emergency room immediately.  Follow-up with your primary care next week.  If you have any worsening symptoms including high fever, shortness of breath, weakness, chest pain, nausea/vomiting interfere with oral intake you need to go to the ER immediately.     ED Prescriptions     Medication Sig Dispense Auth. Provider   oseltamivir  (TAMIFLU ) 75 MG capsule Take 1 capsule (75 mg total) by mouth every 12 (twelve) hours. 10 capsule Estill Llerena K, PA-C   predniSONE  (STERAPRED UNI-PAK 21 TAB) 10 MG (21) TBPK tablet Take 40 mg (4 tablets) daily for days 1 and 2, take 30 mg (3 tablets) daily for days 3 and 4, take 20 mg (2 tablets) daily for days 5 and 6, take 10 mg (1 tablet) daily days 7 and 8, take 5 mg (0.5 tablet) daily days 9 and 10, then stop. 21 tablet Ionia Schey K, PA-C   albuterol  (PROVENTIL ) (2.5 MG/3ML) 0.083% nebulizer solution Take 3 mLs (2.5 mg total) by nebulization every 6 (six) hours as needed for wheezing or shortness of breath. 75 mL Casee Knepp K, PA-C      PDMP not reviewed this encounter.     [1]  Social History Tobacco Use   Smoking status: Former    Current packs/day: 0.00    Average packs/day: 1.5 packs/day for 38.2 years (57.3 ttl pk-yrs)    Types: Cigarettes    Start date: 03/03/1974    Quit date: 05/14/2012    Years since quitting: 11.8    Passive exposure: Past   Smokeless tobacco: Never   Tobacco comments:    denies thoughts of restarting  Vaping Use   Vaping status: Former  Substance Use Topics   Alcohol use: No    Alcohol/week: 0.0 standard drinks of alcohol   Drug use: No     Sherrell Rocky POUR, PA-C 03/13/24 1217  "

## 2024-03-13 NOTE — Discharge Instructions (Signed)
 You tested positive for influenza A.  Please start Tamiflu  twice daily for 5 days.  I am concerned this is triggered your asthma.  Continue using your albuterol  every 4-6 hours as needed and take your Wixela twice daily.  Start prednisone  taper as prescribed.  Do not take NSAIDs with this medication including aspirin, ibuprofen /Advil , naproxen /Aleve .  Normally this helps with breathing issues associated with myasthenia gravis but in some patients it can actually make it worse or if you have any increasing shortness of breath or muscle weakness you need to go to the emergency room immediately.  Follow-up with your primary care next week.  If you have any worsening symptoms including high fever, shortness of breath, weakness, chest pain, nausea/vomiting interfere with oral intake you need to go to the ER immediately.

## 2024-03-13 NOTE — ED Triage Notes (Signed)
 Presenting with cough, body aches, and SOB Onset 3 days ago. Has been coughing up red mucus. No known sick exposure.   Patient tried robitussin with no relief.

## 2024-03-15 ENCOUNTER — Encounter: Payer: Self-pay | Admitting: Family Medicine

## 2024-03-16 ENCOUNTER — Ambulatory Visit

## 2024-03-18 ENCOUNTER — Ambulatory Visit

## 2024-03-18 ENCOUNTER — Encounter: Payer: Self-pay | Admitting: Family Medicine

## 2024-03-18 ENCOUNTER — Ambulatory Visit: Admitting: Family Medicine

## 2024-03-18 ENCOUNTER — Other Ambulatory Visit (HOSPITAL_COMMUNITY): Payer: Self-pay

## 2024-03-18 DIAGNOSIS — D508 Other iron deficiency anemias: Secondary | ICD-10-CM

## 2024-03-18 DIAGNOSIS — E1169 Type 2 diabetes mellitus with other specified complication: Secondary | ICD-10-CM

## 2024-03-18 DIAGNOSIS — J4489 Other specified chronic obstructive pulmonary disease: Secondary | ICD-10-CM

## 2024-03-18 DIAGNOSIS — I10 Essential (primary) hypertension: Secondary | ICD-10-CM

## 2024-03-18 DIAGNOSIS — M5416 Radiculopathy, lumbar region: Secondary | ICD-10-CM

## 2024-03-18 DIAGNOSIS — J101 Influenza due to other identified influenza virus with other respiratory manifestations: Secondary | ICD-10-CM | POA: Diagnosis not present

## 2024-03-18 DIAGNOSIS — G7000 Myasthenia gravis without (acute) exacerbation: Secondary | ICD-10-CM | POA: Diagnosis not present

## 2024-03-18 MED ORDER — FERROUS SULFATE 325 (65 FE) MG PO TABS
325.0000 mg | ORAL_TABLET | ORAL | 1 refills | Status: AC
  Filled 2024-03-18: qty 45, 90d supply, fill #0

## 2024-03-18 MED ORDER — TIRZEPATIDE 10 MG/0.5ML ~~LOC~~ SOAJ
10.0000 mg | SUBCUTANEOUS | 1 refills | Status: AC
  Filled 2024-03-18 – 2024-03-22 (×2): qty 2, 28d supply, fill #0
  Filled 2024-04-05: qty 6, 84d supply, fill #0

## 2024-03-18 MED ORDER — CLONAZEPAM 0.5 MG PO TABS
ORAL_TABLET | ORAL | 1 refills | Status: AC
  Filled 2024-03-18: qty 60, 30d supply, fill #0

## 2024-03-18 NOTE — Assessment & Plan Note (Signed)
 Stable

## 2024-03-18 NOTE — Progress Notes (Signed)
 "   SUBJECTIVE:   CHIEF COMPLAINT / HPI:   Discussed the use of AI scribe software for clinical note transcription with the patient, who gave verbal consent to proceed.  History of Present Illness   Frances Medina is a 64 year old female with myasthenia gravis who presents with flu symptoms and back pain.  URI: She has been experiencing flu symptoms for approximately one week, beginning with a sore throat on the right side and significant swelling of the right tonsil, followed by discomfort on the right side of her face. She was diagnosed with influenza A on March 13, 2024, at an urgent care facility, where she received a breathing treatment and was prescribed Tamiflu , which she is still completing. A chest x-ray was performed, and a COVID-19 test was negative. She has persistent wheezing and a cough that initially produced bloody mucus but has since improved. She uses inhalers to manage her symptoms.  Back pain: She reports back pain that has worsened with physical therapy, leading to an inability to complete sessions. The pain radiates down her right leg, causing muscle spasms, and her left leg experiences numbness. She has a history of severe arthritis in the lumbar spine, with a CT scan from 2016 showing severe arthritis at L4-L5 and moderate arthritis at L5-S1, along with severe bilateral sacroiliac joint osteoarthritis. She has been managing her pain with Tylenol , taking two tablets in the morning and two at night, and has noticed improvement since stopping physical therapy and starting water  aerobics.   Anemia: Stopped taking her iron  pills months ago. She need med refill.    DM2: Compliant with Mounjaro . Want to check on dose adjustment as this also helps with her weight       PERTINENT  PMH / PSH: PMhx reviewed  OBJECTIVE:   BP 135/83   Pulse 77   Temp 97.8 F (36.6 C)   Ht 5' (1.524 m)   Wt 297 lb 9.6 oz (135 kg)   SpO2 97%   BMI 58.12 kg/m   Physical Exam   GEN:  No distress. Obese CHEST: Lungs clear to auscultation, no wheezing. CARDIOVASCULAR: Heart regular rate and rhythm, no murmurs.     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 Weight loss noted, medication effective. - Increased Mounjaro  dose to 10 mg weekly. 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 Severe bilateral sacroiliac joint osteoarthritis. Pain improved with cessation of physical therapy and self-care measures. Gabapentin  discontinued due to issue with hives of unknown etiology. - Referred to water  aerobics for exercise. - Monitor symptoms and consider re-imaging if pain worsens. - Consider gabapentin  if pain becomes consistent. Influenza due to influenza A virus Symptoms improved with treatment, wheezing persists per the patient, but her pulm exam is completely benign today. - Continue Tamiflu  as prescribed. - Use albuterol  inhaler as needed for wheezing. - Monitor symptoms and follow up if condition worsens. Morbid obesity (HCC) Weight decreased from 306 lbs to 297  lbs. Following Mediterranean diet as advised. - Continue Mediterranean diet. - Monitor weight and adjust treatment as needed. Other specified chronic obstructive pulmonary disease (HCC) Stable Myasthenia gravis (HCC) Stable Other iron   deficiency anemia Hemoglobin at 11. Iron  supplementation not in medication list. - Prescribed iron  supplementation. Essential hypertension Blood pressure at 135/82, improved. - Continue to monitor blood pressure regularly.     Otto Fairly, MD [facility]  "

## 2024-03-18 NOTE — Assessment & Plan Note (Signed)
 Managed with Mounjaro  7.5 mg weekly. Weight loss noted, medication effective. - Increased Mounjaro  dose to 10 mg weekly. - Monitor CBG closely at home

## 2024-03-18 NOTE — Patient Instructions (Signed)
 Obesity, Adult Obesity is having too much body fat. Being obese means that your weight is more than what is healthy for you.  BMI (body mass index) is a number that explains how much body fat you have. If you have a BMI of 30 or more, you are obese. Obesity can cause serious health problems, such as: Stroke. Coronary artery disease (CAD). Type 2 diabetes. Some types of cancer. High blood pressure (hypertension). High cholesterol. Gallbladder stones. Obesity can also contribute to: Osteoarthritis. Sleep apnea. Infertility problems. What are the causes? Eating meals each day that are high in calories, sugar, and fat. Drinking a lot of drinks that have sugar in them. Being born with genes that may make you more likely to become obese. Having a medical condition that causes obesity. Taking certain medicines. Sitting a lot (having a sedentary lifestyle). Not getting enough sleep. What increases the risk? Having a family history of obesity. Living in an area with limited access to: [place], recreation centers, or sidewalks. Healthy food choices, such as grocery stores and farmers' markets. What are the signs or symptoms? The main sign is having too much body fat. How is this treated? Treatment for this condition often includes changing your lifestyle. Treatment may include: Changing your diet. This may include making a healthy meal plan. Exercise. This may include activity that causes your heart to beat faster (aerobic exercise) and strength training. Work with your doctor to design a program that works for you. Medicine to help you lose weight. This may be used if you are not able to lose one pound a week after 6 weeks of healthy eating and more exercise. Treating conditions that cause the obesity. Surgery. Options may include gastric banding and gastric bypass. This may be done if: Other treatments have not helped to improve your condition. You have a BMI of 40 or higher. You have  life-threatening health problems related to obesity. Follow these instructions at home: Eating and drinking  Follow advice from your doctor about what to eat and drink. Your doctor may tell you to: Limit fast food, sweets, and processed snack foods. Choose low-fat options. For example, choose low-fat milk instead of whole milk. Eat five or more servings of fruits or vegetables each day. Eat at home more often. This gives you more control over what you eat. Choose healthy foods when you eat out. Learn to read food labels. This will help you learn how much food is in one serving. Keep low-fat snacks available. Avoid drinks that have a lot of sugar in them. These include soda, fruit juice, iced tea with sugar, and flavored milk. Drink enough water to keep your pee (urine) pale yellow. Do not go on fad diets. Physical activity Exercise often, as told by your doctor. Most adults should get up to 150 minutes of moderate-intensity exercise every week.Ask your doctor: What types of exercise are safe for you. How often you should exercise. Warm up and stretch before being active. Do slow stretching after being active (cool down). Rest between times of being active. Lifestyle Work with your doctor and a food expert (dietitian) to set a weight-loss goal that is best for you. Limit your screen time. Find ways to reward yourself that do not involve food. Do not drink alcohol if: Your doctor tells you not to drink. You are pregnant, may be pregnant, or are planning to become pregnant. If you drink alcohol: Limit how much you have to: 0-1 drink a day for women. 0-2 drinks  a day for men. Know how much alcohol is in your drink. In the [place] one drink equals one 12 oz bottle of beer (355 mL), one 5 oz glass of wine (148 mL), or one 1 oz glass of hard liquor (44 mL). General instructions Keep a weight-loss journal. This can help you keep track of: The food that you eat. How much exercise you  get. Take over-the-counter and prescription medicines only as told by your doctor. Take vitamins and supplements only as told by your doctor. Think about joining a support group. Pay attention to your mental health as obesity can lead to depression or self esteem issues. Keep all follow-up visits. Contact a doctor if: You cannot meet your weight-loss goal after you have changed your diet and lifestyle for 6 weeks. You are having trouble breathing. Summary Obesity is having too much body fat. Being obese means that your weight is more than what is healthy for you. Work with your doctor to set a weight-loss goal. Get regular exercise as told by your doctor. This information is not intended to replace advice given to you by your health care provider. Make sure you discuss any questions you have with your health care provider. Document Revised: 09/25/2020 Document Reviewed: 09/25/2020 Elsevier Patient Education  2024 [employer].

## 2024-03-18 NOTE — Assessment & Plan Note (Addendum)
 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 Severe bilateral sacroiliac joint osteoarthritis. Pain improved with cessation of physical therapy and self-care measures. Gabapentin  discontinued due to issue with hives of unknown etiology. - Referred to water  aerobics for exercise. - Monitor symptoms and consider re-imaging if pain worsens. - Consider gabapentin  if pain becomes consistent.

## 2024-03-18 NOTE — Assessment & Plan Note (Addendum)
 Blood pressure at 135/82, improved. - Continue to monitor blood pressure regularly.

## 2024-03-18 NOTE — Assessment & Plan Note (Addendum)
 Hemoglobin at 11. Iron  supplementation not in medication list. - Prescribed iron  supplementation.

## 2024-03-18 NOTE — Assessment & Plan Note (Addendum)
 Weight decreased from 306 lbs to 297 lbs. Following Mediterranean diet as advised. - Continue Mediterranean diet. - Monitor weight and adjust treatment as needed.

## 2024-03-19 ENCOUNTER — Other Ambulatory Visit (HOSPITAL_COMMUNITY): Payer: Self-pay

## 2024-03-21 ENCOUNTER — Ambulatory Visit

## 2024-03-21 VITALS — BP 135/83 | HR 77 | Ht 60.0 in | Wt 297.0 lb

## 2024-03-21 DIAGNOSIS — Z0000 Encounter for general adult medical examination without abnormal findings: Secondary | ICD-10-CM

## 2024-03-21 NOTE — Patient Instructions (Signed)
 Frances Medina,  Thank you for taking the time for your Medicare Wellness Visit. I appreciate your continued commitment to your health goals. Please review the care plan we discussed, and feel free to reach out if I can assist you further.  Please note that Annual Wellness Visits do not include a physical exam. Some assessments may be limited, especially if the visit was conducted virtually. If needed, we may recommend an in-person follow-up with your provider.  Ongoing Care Seeing your primary care provider every 3 to 6 months helps us  monitor your health and provide consistent, personalized care.   Referrals If a referral was made during today's visit and you haven't received any updates within two weeks, please contact the referred provider directly to check on the status.  Recommended Screenings:  Health Maintenance  Topic Date Due   Medicare Annual Wellness Visit  03/12/2024   Complete foot exam   06/14/2024   Hemoglobin A1C  08/02/2024   COVID-19 Vaccine (8 - Pfizer risk 2025-26 season) 08/02/2024   Kidney health urinalysis for diabetes  08/03/2024   Breast Cancer Screening  09/02/2024   Screening for Lung Cancer  09/10/2024   Eye exam for diabetics  10/01/2024   Yearly kidney function blood test for diabetes  03/04/2025   Pap with HPV screening  03/12/2027   Colon Cancer Screening  09/21/2033   DTaP/Tdap/Td vaccine (3 - Td or Tdap) 10/04/2033   Pneumococcal Vaccine for age over 34  Completed   Flu Shot  Completed   Hepatitis B Vaccine  Completed   HPV Vaccine (No Doses Required) Completed   Hepatitis C Screening  Completed   HIV Screening  Completed   Zoster (Shingles) Vaccine  Completed   Meningitis B Vaccine  Aged Out       03/21/2024    9:06 AM  Advanced Directives  Does Patient Have a Medical Advance Directive? No  Would patient like information on creating a medical advance directive? No - Patient declined    Vision: Annual vision screenings are recommended for  early detection of glaucoma, cataracts, and diabetic retinopathy. These exams can also reveal signs of chronic conditions such as diabetes and high blood pressure.  Dental: Annual dental screenings help detect early signs of oral cancer, gum disease, and other conditions linked to overall health, including heart disease and diabetes.  Please see the attached documents for additional preventive care recommendations.

## 2024-03-21 NOTE — Progress Notes (Signed)
 "  Chief Complaint  Patient presents with   Medicare Wellness    SUBSEQUENT     Subjective:   Frances Medina is a 64 y.o. female who presents for a Medicare Annual Wellness Visit.  Visit info / Clinical Intake: Medicare Wellness Visit Type:: Subsequent Annual Wellness Visit Persons participating in visit and providing information:: patient Medicare Wellness Visit Mode:: In-person (required for WTM) Interpreter Needed?: No Pre-visit prep was completed: yes AWV questionnaire completed by patient prior to visit?: yes Date:: 03/19/24 Living arrangements:: lives with spouse/significant other Patient's Overall Health Status Rating: very good Typical amount of pain: some Does pain affect daily life?: no Are you currently prescribed opioids?: no  Dietary Habits and Nutritional Risks How many meals a day?: 2 Eats fruit and vegetables daily?: yes Most meals are obtained by: preparing own meals In the last 2 weeks, have you had any of the following?: none Diabetic:: no  Functional Status Activities of Daily Living (to include ambulation/medication): Independent Ambulation: Independent with device- listed below Home Assistive Devices/Equipment: Cane; CPAP; Eyeglasses Medication Administration: Independent Home Management (perform basic housework or laundry): Independent Manage your own finances?: yes Primary transportation is: driving Concerns about vision?: no *vision screening is required for WTM* Concerns about hearing?: no  Fall Screening Falls in the past year?: 0 Number of falls in past year: 0 Was there an injury with Fall?: 0 Fall Risk Category Calculator: 0 Patient Fall Risk Level: Low Fall Risk  Fall Risk Patient at Risk for Falls Due to: No Fall Risks Fall risk Follow up: Falls evaluation completed; Education provided  Home and Transportation Safety: All rugs have non-skid backing?: yes All stairs or steps have railings?: yes Grab bars in the bathtub or shower?:  yes Have non-skid surface in bathtub or shower?: yes Good home lighting?: yes Regular seat belt use?: yes Hospital stays in the last year:: no  Cognitive Assessment Difficulty concentrating, remembering, or making decisions? : no Will 6CIT or Mini Cog be Completed: yes What year is it?: 0 points What month is it?: 0 points Give patient an address phrase to remember (5 components): [address] [place] what time is it?: 0 points Count backwards from 20 to 1: 0 points Say the months of the year in reverse: 0 points Repeat the address phrase from earlier: 0 points 6 CIT Score: 0 points  Advance Directives (For Healthcare) Does Patient Have a Medical Advance Directive?: No Would patient like information on creating a medical advance directive?: No - Patient declined  Reviewed/Updated  Reviewed/Updated: Reviewed All (Medical, Surgical, Family, Medications, Allergies, Care Teams, Patient Goals)    Allergies (verified) Ciprofloxacin , Naproxen , Shrimp [shellfish allergy ], Dicyclomine, Methocarbamol , Pineapple, and Morphine  and codeine    Current Medications (verified) Outpatient Encounter Medications as of 03/21/2024  Medication Sig   acetaminophen  (TYLENOL ) 500 MG tablet Take 500 mg by mouth in the morning and at bedtime.   albuterol  (PROVENTIL ) (2.5 MG/3ML) 0.083% nebulizer solution Take 3 mLs (2.5 mg total) by nebulization every 6 (six) hours as needed for wheezing or shortness of breath.   albuterol  (VENTOLIN  HFA) 108 (90 Base) MCG/ACT inhaler Inhale 2 puffs into the lungs every 6 (six) hours as needed for wheezing or shortness of breath   Ascorbic Acid (VITAMIN C PO) Take 1,000 mg by mouth daily.   azelastine  (ASTELIN ) 0.1 % nasal spray Place 1 spray into both nostrils 2 (two) times daily. Use in each nostril as directed (Patient taking differently: Place 1 spray into both  nostrils 2 (two) times daily as needed. Use in each nostril as directed)   Cyanocobalamin   (VITAMIN B12 PO) Take 1 tablet by mouth daily.   dexlansoprazole  (DEXILANT ) 60 MG capsule Take 1 capsule (60 mg total) by mouth daily.   EPINEPHrine  0.3 mg/0.3 mL IJ SOAJ injection Inject 0.3 mg into the muscle as needed for anaphylaxis.   famotidine  (PEPCID ) 20 MG tablet Take 1 tablet (20 mg total) by mouth 2 (two) times daily as needed for heartburn or indigestion (hives). (Patient taking differently: Take 20 mg by mouth 2 (two) times daily as needed.)   fluticasone  (FLONASE ) 50 MCG/ACT nasal spray Place 2 sprays into both nostrils daily. (Patient taking differently: Place 2 sprays into both nostrils daily as needed.)   lisinopril  (ZESTRIL ) 5 MG tablet Take 1 tablet (5 mg total) by mouth at bedtime.   Menthol  10 MG LOZG Take 1 lozenge by mouth daily as needed (cough).   montelukast  (SINGULAIR ) 10 MG tablet Take 1 tablet (10 mg total) by mouth at bedtime.   mycophenolate  (CELLCEPT ) 500 MG tablet Take 500 mg by mouth 2 (two) times daily.   oseltamivir  (TAMIFLU ) 75 MG capsule Take 1 capsule (75 mg total) by mouth every 12 (twelve) hours.   predniSONE  (DELTASONE ) 10 MG tablet Take 40 mg (4 tablets) daily for days 1 and 2, take 30 mg (3 tablets) daily for days 3 and 4, take 20 mg (2 tablets) daily for days 5 and 6, take 10 mg (1 tablet) daily days 7 and 8, take 5 mg (0.5 tablet) daily days 9 and 10, then stop.   Remibrutinib (RHAPSIDO) 25 MG TABS Take 1 tablet by mouth in the morning and at bedtime.   sertraline  (ZOLOFT ) 50 MG tablet Take 3 tablets (150 mg total) by mouth daily.   VITAMIN D , CHOLECALCIFEROL, PO Take 1 tablet by mouth daily. otc   WIXELA INHUB 250-50 MCG/ACT AEPB INHALE 1 PUFF INTO THE LUNGS IN THE MORNING AND AT BEDTIME.   Accu-Chek Softclix Lancets lancets Use as instructed   APPLE CIDER VINEGAR PO Take 1 capsule by mouth daily.   Blood Glucose Monitoring Suppl (ACCU-CHEK GUIDE ME) w/Device KIT Use as directed.   clonazePAM  (KLONOPIN ) 0.5 MG tablet TAKE 1 TABLET BY MOUTH TWICE A DAY  AS NEEDED FOR ANXIETY   Continuous Glucose Sensor (FREESTYLE LIBRE 3 PLUS SENSOR) MISC Change sensor every 15 days. (Patient taking differently: as needed. Change sensor every 15 days.)   ferrous sulfate  325 (65 FE) MG tablet Take 1 tablet (325 mg total) by mouth every other day.   glucose blood (ACCU-CHEK SMARTVIEW) test strip Use as instructed (Patient taking differently: 1 each as needed. Use as instructed)   hydrOXYzine  (ATARAX ) 10 MG tablet Take 1 tablet (10 mg total) by mouth at bedtime as needed for itching (or hives).   Lancets Misc. (ACCU-CHEK SOFTCLIX LANCET DEV) KIT Use as directed   nystatin  (MYCOSTATIN /NYSTOP ) powder APPLY TOPICALLY 3 TIMES DAILY AS NEEDED.   tirzepatide  (MOUNJARO ) 10 MG/0.5ML Pen Inject 10 mg into the skin once a week.   No facility-administered encounter medications on file as of 03/21/2024.    History: Past Medical History:  Diagnosis Date   Acute respiratory failure (HCC)    Acute respiratory failure with hypoxemia (HCC)    Aspiration into airway    Back pain 07/31/2017   Benign neoplasm of rectum    CAP (community acquired pneumonia) 08/05/2015   Depression with anxiety    Diabetes mellitus, new onset (HCC)  06/02/2023   Diverticulosis    Dizziness 03/13/2023   Dysphagia 03/2015   EGD, Dr Avram. mild antral gastritis, ? due to Ibuprofen .  no stricture but empirically maloney dilated esophagus.    Dysphagia, neurologic    E. 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   GERD (gastroesophageal reflux disease)    hiatal hernia   Hypertension    went away when I stopped smoking   Knee injury    Left hip pain 02/18/2017   Migraine    maybe couple times/month  (03/20/2015)   Mild intermittent asthma 06/22/2017   Myasthenia gravis (HCC) 2017   Myasthenia gravis with acute exacerbation (HCC) 05/15/2015   Osteoarthritis of left knee 12/02/2013   Osteoarthritis of right knee 08/30/2013   Pancreatitis 07/25/2017   Protein-calorie malnutrition, severe 08/07/2015   Seasonal allergies    takes Zytrec   Shortness of breath    Sinusitis 02/25/2018   Sleep apnea    with cPAp machine   Tracheostomy status (HCC)    Tubular adenoma of colon    Tumors    in my stomach   Umbilical hernia    watching , no plans for surgery at present   Urine incontinence 10/19/2015   Urticaria    VAP (ventilator-associated pneumonia) [facility])    Past Surgical History:  Procedure Laterality Date   BIOPSY  07/05/2019   Procedure: BIOPSY;  Surgeon: Aneita [name], MD;  Location: [facility];  Service: Endoscopy;;   CESAREAN SECTION  1987; 1989   COLONOSCOPY N/A 09/22/2023   Procedure: COLONOSCOPY;  Surgeon: Federico Rosario BROCKS, MD;  Location: [facility];  Service: Gastroenterology;  Laterality: N/A;   COLONOSCOPY WITH PROPOFOL  N/A 09/19/2013   Procedure: COLONOSCOPY WITH PROPOFOL ;  Surgeon: [name] Aneita, MD;  Location: [facility];  Service: Endoscopy;  Laterality: N/A;   COLONOSCOPY WITH PROPOFOL  N/A 07/05/2019   Procedure: COLONOSCOPY WITH PROPOFOL ;  Surgeon: Aneita [name], MD;  Location: [facility];  Service: Endoscopy;  Laterality: N/A;   DILATION AND CURETTAGE OF UTERUS     ESOPHAGOGASTRODUODENOSCOPY N/A 08/10/2015   Procedure: ESOPHAGOGASTRODUODENOSCOPY (EGD);  Surgeon: Lupita FORBES Avram, MD;  Location: [facility];  Service: Endoscopy;  Laterality: N/A;   ESOPHAGOGASTRODUODENOSCOPY N/A 09/22/2023   Procedure: EGD (ESOPHAGOGASTRODUODENOSCOPY);  Surgeon: Federico Rosario BROCKS, MD;  Location: [facility];  Service: Gastroenterology;  Laterality: N/A;   ESOPHAGOGASTRODUODENOSCOPY (EGD) WITH PROPOFOL  N/A 03/21/2015   Procedure: ESOPHAGOGASTRODUODENOSCOPY (EGD) WITH PROPOFOL ;   Surgeon: Lupita FORBES Avram, MD;  Location: [facility];  Service: Endoscopy;  Laterality: N/A;   ESOPHAGOGASTRODUODENOSCOPY (EGD) WITH PROPOFOL  N/A 07/05/2019   Procedure: ESOPHAGOGASTRODUODENOSCOPY (EGD) WITH PROPOFOL ;  Surgeon: Aneita [name], MD;  Location: [facility];  Service: Endoscopy;  Laterality: N/A;   PARTIAL KNEE ARTHROPLASTY Right 08/30/2013   Procedure: RIGHT UNICOMPARTMENTAL KNEE;  Surgeon: Fonda SHAUNNA Olmsted, MD;  Location: [facility] OR;  Service: Orthopedics;  Laterality: Right;   PARTIAL KNEE ARTHROPLASTY Left 12/02/2013   Procedure: LEFT KNEE UNI ARTHROPLASTY;  Surgeon: Fonda SHAUNNA Olmsted, MD;  Location: [facility];  Service: Orthopedics;  Laterality: Left;   PEG PLACEMENT N/A 08/10/2015   Procedure: PERCUTANEOUS ENDOSCOPIC GASTROSTOMY (PEG) PLACEMENT;  Surgeon: Lupita FORBES Avram, MD;  Location: [facility]  ENDOSCOPY;  Service: Endoscopy;  Laterality: N/A;   POLYPECTOMY  07/05/2019   Procedure: POLYPECTOMY;  Surgeon: Aneita [name], MD;  Location: [facility];  Service: Endoscopy;;   TUBAL LIGATION  1989   VAGINAL HYSTERECTOMY  1990's?   apparently took out one of my ovaries at the time too cause one's missing   Family History  Problem Relation Age of Onset   Heart disease Mother 71   Hypertension Mother    Stroke Father    Hypertension Father    Atopy Sister    Allergic rhinitis Sister    Other Sister        Esophageal strictures   Allergic rhinitis Sister    Anemia Sister    Leukemia Sister    Leukemia Sister    COPD Brother    Colon cancer Neg Hx    Social History   Occupational History   Occupation: disabled  Tobacco Use   Smoking status: Former    Current packs/day: 0.00    Average packs/day: 1.5 packs/day for 38.2 years (57.3 ttl pk-yrs)    Types: Cigarettes    Start date: 03/03/1974    Quit date: 05/14/2012    Years since quitting: 11.8    Passive exposure: Past   Smokeless tobacco: Never   Tobacco comments:    denies thoughts of restarting  Vaping Use    Vaping status: Former  Substance and Sexual Activity   Alcohol use: No    Alcohol/week: 0.0 standard drinks of alcohol   Drug use: No   Sexual activity: Not Currently    Birth control/protection: Surgical   Tobacco Counseling Counseling given: Not Answered Tobacco comments: denies thoughts of restarting  SDOH Screenings   Food Insecurity: No Food Insecurity (03/21/2024)  Housing: Low Risk (03/21/2024)  Transportation Needs: No Transportation Needs (03/21/2024)  Utilities: Not At Risk (03/21/2024)  Alcohol Screen: Low Risk (04/02/2023)  Depression (PHQ2-9): Low Risk (03/21/2024)  Financial Resource Strain: Low Risk (04/06/2023)  Physical Activity: Insufficiently Active (03/21/2024)  Social Connections: Moderately Integrated (03/21/2024)  Stress: No Stress Concern Present (03/21/2024)  Tobacco Use: Medium Risk (03/21/2024)  Health Literacy: Adequate Health Literacy (03/21/2024)   See flowsheets for full screening details  Depression Screen Depression Screening Exception Documentation Depression Screening Exception:: Patient refusal  PHQ 2 & 9 Depression Scale- Over the past 2 weeks, how often have you been bothered by any of the following problems? Little interest or pleasure in doing things: 0 Feeling down, depressed, or hopeless (PHQ Adolescent also includes...irritable): 0 PHQ-2 Total Score: 0 Trouble falling or staying asleep, or sleeping too much: 0 Feeling tired or having little energy: 0 Poor appetite or overeating (PHQ Adolescent also includes...weight loss): 0 Feeling bad about yourself - or that you are a failure or have let yourself or your family down: 0 Trouble concentrating on things, such as reading the newspaper or watching television (PHQ Adolescent also includes...like school work): 0 Moving or speaking so slowly that other people could have noticed. Or the opposite - being so fidgety or restless that you have been moving around a lot more than usual: 0 Thoughts that you  would be better off dead, or of hurting yourself in some way: 0 PHQ-9 Total Score: 0 If you checked off any problems, how difficult have these problems made it for you to do your work, take care of things at home, or get along with other people?: Not difficult at all  Depression Treatment Depression Interventions/Treatment : [id number] Score <4 Follow-up Not  Indicated     Goals Addressed             This Visit's Progress    03/21/2024: My goal is to stay on Mediterran Diet, eat healthier and lose weight.               Objective:    Today's Vitals   03/21/24 0902  BP: 135/83  Pulse: 77  SpO2: 97%  Weight: 297 lb (134.7 kg)  Height: 5' (1.524 m)   Body mass index is 58 kg/m.  Hearing/Vision screen No results found. Immunizations and Health Maintenance Health Maintenance  Topic Date Due   FOOT EXAM  06/14/2024   HEMOGLOBIN A1C  08/02/2024   COVID-19 Vaccine (8 - Pfizer risk 2025-26 season) 08/02/2024   Diabetic kidney evaluation - Urine ACR  08/03/2024   Mammogram  09/02/2024   Lung Cancer Screening  09/10/2024   OPHTHALMOLOGY EXAM  10/01/2024   Diabetic kidney evaluation - eGFR measurement  03/04/2025   Medicare Annual Wellness (AWV)  03/21/2025   Cervical Cancer Screening (HPV/Pap Cotest)  03/12/2027   Colonoscopy  09/21/2033   DTaP/Tdap/Td (3 - Td or Tdap) 10/04/2033   Pneumococcal Vaccine: 50+ Years  Completed   Influenza Vaccine  Completed   Hepatitis B Vaccines 19-59 Average Risk  Completed   HPV VACCINES (No Doses Required) Completed   Hepatitis C Screening  Completed   HIV Screening  Completed   Zoster Vaccines- Shingrix  Completed   Meningococcal B Vaccine  Aged Out        Assessment/Plan:  This is a routine wellness examination for Frances Medina.  Patient Care Team: Anders Otto DASEN, MD as PCP - General (Family Medicine) Loni Soyla LABOR, MD as PCP - Cardiology (Cardiology) Wonda Cy BROCKS, RD as Dietitian [facility] Medicine)  I have personally reviewed  and noted the following in the patients chart:   Medical and social history Use of alcohol, tobacco or illicit drugs  Current medications and supplements including opioid prescriptions. Functional ability and status Nutritional status Physical activity Advanced directives List of other physicians Hospitalizations, surgeries, and ER visits in previous 12 months Vitals Screenings to include cognitive, depression, and falls Referrals and appointments  No orders of the defined types were placed in this encounter.  In addition, I have reviewed and discussed with patient certain preventive protocols, quality metrics, and best practice recommendations. A written personalized care plan for preventive services as well as general preventive health recommendations were provided to patient.   Frances LOISE Fuller, LPN   8/80/7973   Return in about 1 year (around 03/21/2025) for Medicare wellness.  After Visit Summary: (In Person-Printed) AVS printed and given to the patient  Nurse Notes: Patient is up to date with all care gaps. "

## 2024-03-22 ENCOUNTER — Ambulatory Visit

## 2024-03-22 ENCOUNTER — Other Ambulatory Visit: Payer: Self-pay

## 2024-03-22 ENCOUNTER — Other Ambulatory Visit (HOSPITAL_COMMUNITY): Payer: Self-pay

## 2024-03-24 ENCOUNTER — Ambulatory Visit

## 2024-03-25 ENCOUNTER — Telehealth: Payer: Self-pay | Admitting: Allergy

## 2024-03-25 NOTE — Telephone Encounter (Signed)
 Patient called about needing a refill on rhapsido. Apparently [facility] told her that she needs the physician's office to contact them regarding. This.  She has 5 pills left.  Told patient that we can't do [place] over the weekend and as of now we are opening Monday at 1:30PM due to the weather.  She can call back Monday and we can provide her with a sample bottle while trying to get this medication refilled if the office is open on Monday weather permitting.

## 2024-03-29 ENCOUNTER — Encounter: Payer: Self-pay | Admitting: Family Medicine

## 2024-03-29 NOTE — Telephone Encounter (Signed)
 Called and spoke with the patient and informed. Patient verbalized understanding.

## 2024-03-29 NOTE — Telephone Encounter (Signed)
 Patient's husband came and picked up the samples.

## 2024-03-30 ENCOUNTER — Ambulatory Visit

## 2024-03-30 DIAGNOSIS — D509 Iron deficiency anemia, unspecified: Secondary | ICD-10-CM | POA: Diagnosis not present

## 2024-03-30 DIAGNOSIS — K7581 Nonalcoholic steatohepatitis (NASH): Secondary | ICD-10-CM

## 2024-03-30 LAB — HEMOCCULT SLIDES (X 3 CARDS)
Fecal Occult Blood: NEGATIVE
OCCULT 1: NEGATIVE
OCCULT 2: NEGATIVE
OCCULT 3: NEGATIVE
OCCULT 4: NEGATIVE
OCCULT 5: NEGATIVE

## 2024-04-05 ENCOUNTER — Other Ambulatory Visit (HOSPITAL_COMMUNITY): Payer: Self-pay

## 2024-04-05 ENCOUNTER — Other Ambulatory Visit: Payer: Self-pay

## 2024-04-05 ENCOUNTER — Telehealth (HOSPITAL_COMMUNITY): Payer: Self-pay

## 2024-04-06 ENCOUNTER — Encounter: Payer: Self-pay | Admitting: Family Medicine

## 2024-04-06 NOTE — Progress Notes (Signed)
 CPAP form completed based on previous titration report.  - autopap 5-15.     CPAP machine pressure per recent sleep study = 12 cmH2O  RECOMMENDATIONS  - Trial of CPAP therapy on 12 cm H2O or autopap 5-15.  - Patient used a Small size Resmed Full Face Mask AirFit 20 for Her mask and heated humidification.

## 2024-04-21 ENCOUNTER — Ambulatory Visit: Admitting: Pulmonary Disease

## 2024-04-22 ENCOUNTER — Ambulatory Visit: Admitting: Internal Medicine

## 2024-04-22 ENCOUNTER — Ambulatory Visit: Admitting: Family Medicine

## 2024-06-08 ENCOUNTER — Ambulatory Visit (HOSPITAL_COMMUNITY): Admit: 2024-06-08 | Admitting: Internal Medicine

## 2024-06-08 ENCOUNTER — Encounter (HOSPITAL_COMMUNITY): Payer: Self-pay

## 2024-06-08 SURGERY — MONITORING, ESOPHAGEAL PH, 24 HOUR
Anesthesia: Choice

## 2024-06-14 ENCOUNTER — Ambulatory Visit: Admitting: Podiatry
